# Patient Record
Sex: Male | Born: 1935 | Race: White | Hispanic: No | Marital: Married | Smoking: Former smoker
Health system: Southern US, Community
[De-identification: ages and names within clinical notes are randomized; demographics above are authoritative.]

## PROBLEM LIST (undated history)

## (undated) DIAGNOSIS — R2689 Other abnormalities of gait and mobility: Secondary | ICD-10-CM

## (undated) DIAGNOSIS — Z9289 Personal history of other medical treatment: Secondary | ICD-10-CM

## (undated) DIAGNOSIS — Z8619 Personal history of other infectious and parasitic diseases: Secondary | ICD-10-CM

## (undated) DIAGNOSIS — F329 Major depressive disorder, single episode, unspecified: Secondary | ICD-10-CM

## (undated) DIAGNOSIS — G629 Polyneuropathy, unspecified: Secondary | ICD-10-CM

## (undated) DIAGNOSIS — H35379 Puckering of macula, unspecified eye: Secondary | ICD-10-CM

## (undated) DIAGNOSIS — N390 Urinary tract infection, site not specified: Secondary | ICD-10-CM

## (undated) DIAGNOSIS — R339 Retention of urine, unspecified: Secondary | ICD-10-CM

## (undated) DIAGNOSIS — D649 Anemia, unspecified: Secondary | ICD-10-CM

## (undated) DIAGNOSIS — S8010XA Contusion of unspecified lower leg, initial encounter: Secondary | ICD-10-CM

## (undated) DIAGNOSIS — W19XXXA Unspecified fall, initial encounter: Secondary | ICD-10-CM

## (undated) DIAGNOSIS — K5641 Fecal impaction: Secondary | ICD-10-CM

## (undated) DIAGNOSIS — I739 Peripheral vascular disease, unspecified: Secondary | ICD-10-CM

## (undated) DIAGNOSIS — R531 Weakness: Secondary | ICD-10-CM

## (undated) DIAGNOSIS — E039 Hypothyroidism, unspecified: Secondary | ICD-10-CM

## (undated) DIAGNOSIS — IMO0002 Reserved for concepts with insufficient information to code with codable children: Secondary | ICD-10-CM

## (undated) DIAGNOSIS — G250 Essential tremor: Secondary | ICD-10-CM

## (undated) DIAGNOSIS — M129 Arthropathy, unspecified: Secondary | ICD-10-CM

## (undated) DIAGNOSIS — M4802 Spinal stenosis, cervical region: Secondary | ICD-10-CM

## (undated) DIAGNOSIS — E349 Endocrine disorder, unspecified: Secondary | ICD-10-CM

## (undated) DIAGNOSIS — I491 Atrial premature depolarization: Secondary | ICD-10-CM

## (undated) DIAGNOSIS — R296 Repeated falls: Secondary | ICD-10-CM

## (undated) DIAGNOSIS — K4120 Bilateral femoral hernia, without obstruction or gangrene, not specified as recurrent: Secondary | ICD-10-CM

## (undated) DIAGNOSIS — N179 Acute kidney failure, unspecified: Secondary | ICD-10-CM

## (undated) DIAGNOSIS — R338 Other retention of urine: Secondary | ICD-10-CM

## (undated) DIAGNOSIS — F32A Depression, unspecified: Secondary | ICD-10-CM

## (undated) DIAGNOSIS — N312 Flaccid neuropathic bladder, not elsewhere classified: Secondary | ICD-10-CM

## (undated) DIAGNOSIS — I998 Other disorder of circulatory system: Secondary | ICD-10-CM

## (undated) DIAGNOSIS — E785 Hyperlipidemia, unspecified: Secondary | ICD-10-CM

## (undated) DIAGNOSIS — G43909 Migraine, unspecified, not intractable, without status migrainosus: Secondary | ICD-10-CM

## (undated) DIAGNOSIS — Y92009 Unspecified place in unspecified non-institutional (private) residence as the place of occurrence of the external cause: Secondary | ICD-10-CM

## (undated) DIAGNOSIS — I6529 Occlusion and stenosis of unspecified carotid artery: Secondary | ICD-10-CM

## (undated) DIAGNOSIS — G5760 Lesion of plantar nerve, unspecified lower limb: Secondary | ICD-10-CM

## (undated) DIAGNOSIS — N433 Hydrocele, unspecified: Secondary | ICD-10-CM

## (undated) DIAGNOSIS — N400 Enlarged prostate without lower urinary tract symptoms: Secondary | ICD-10-CM

## (undated) DIAGNOSIS — R4182 Altered mental status, unspecified: Secondary | ICD-10-CM

## (undated) DIAGNOSIS — K4020 Bilateral inguinal hernia, without obstruction or gangrene, not specified as recurrent: Secondary | ICD-10-CM

## (undated) DIAGNOSIS — N3941 Urge incontinence: Secondary | ICD-10-CM

## (undated) DIAGNOSIS — G252 Other specified forms of tremor: Secondary | ICD-10-CM

## (undated) DIAGNOSIS — H3581 Retinal edema: Secondary | ICD-10-CM

## (undated) DIAGNOSIS — Q742 Other congenital malformations of lower limb(s), including pelvic girdle: Secondary | ICD-10-CM

## (undated) DIAGNOSIS — H3530 Unspecified macular degeneration: Secondary | ICD-10-CM

## (undated) DIAGNOSIS — Z961 Presence of intraocular lens: Secondary | ICD-10-CM

## (undated) DIAGNOSIS — E559 Vitamin D deficiency, unspecified: Secondary | ICD-10-CM

## (undated) DIAGNOSIS — R600 Localized edema: Secondary | ICD-10-CM

## (undated) DIAGNOSIS — S7001XA Contusion of right hip, initial encounter: Secondary | ICD-10-CM

## (undated) DIAGNOSIS — Z800 Family history of malignant neoplasm of digestive organs: Secondary | ICD-10-CM

## (undated) DIAGNOSIS — I70229 Atherosclerosis of native arteries of extremities with rest pain, unspecified extremity: Secondary | ICD-10-CM

## (undated) DIAGNOSIS — M4800 Spinal stenosis, site unspecified: Secondary | ICD-10-CM

## (undated) DIAGNOSIS — R262 Difficulty in walking, not elsewhere classified: Secondary | ICD-10-CM

## (undated) DIAGNOSIS — H4010X0 Unspecified open-angle glaucoma, stage unspecified: Secondary | ICD-10-CM

## (undated) DIAGNOSIS — R001 Bradycardia, unspecified: Secondary | ICD-10-CM

## (undated) DIAGNOSIS — Z86010 Personal history of colonic polyps: Secondary | ICD-10-CM

## (undated) DIAGNOSIS — F411 Generalized anxiety disorder: Secondary | ICD-10-CM

## (undated) DIAGNOSIS — K209 Esophagitis, unspecified: Secondary | ICD-10-CM

## (undated) DIAGNOSIS — I10 Essential (primary) hypertension: Secondary | ICD-10-CM

## (undated) HISTORY — DX: Personal history of other medical treatment: Z92.89

## (undated) HISTORY — DX: Bilateral femoral hernia, without obstruction or gangrene, not specified as recurrent: K41.20

## (undated) HISTORY — DX: Bilateral inguinal hernia, without obstruction or gangrene, not specified as recurrent: K40.20

## (undated) HISTORY — DX: Puckering of macula, unspecified eye: H35.379

## (undated) HISTORY — DX: Hypothyroidism, unspecified: E03.9

## (undated) HISTORY — DX: Unspecified fall, initial encounter: W19.XXXA

## (undated) HISTORY — DX: Essential (primary) hypertension: I10

## (undated) HISTORY — DX: Retinal edema: H35.81

## (undated) HISTORY — DX: Essential tremor: G25.0

## (undated) HISTORY — DX: Urinary tract infection, site not specified: N39.0

## (undated) HISTORY — DX: Vitamin D deficiency, unspecified: E55.9

## (undated) HISTORY — DX: Spinal stenosis, cervical region: M48.02

## (undated) HISTORY — DX: Migraine, unspecified, not intractable, without status migrainosus: G43.909

## (undated) HISTORY — PX: TONSILLECTOMY AND ADENOIDECTOMY: SUR1326

## (undated) HISTORY — DX: Reserved for concepts with insufficient information to code with codable children: IMO0002

## (undated) HISTORY — DX: Flaccid neuropathic bladder, not elsewhere classified: N31.2

## (undated) HISTORY — DX: Unspecified open-angle glaucoma, stage unspecified: H40.10X0

## (undated) HISTORY — DX: Hyperlipidemia, unspecified: E78.5

## (undated) HISTORY — DX: Difficulty in walking, not elsewhere classified: R26.2

## (undated) HISTORY — DX: Hydrocele, unspecified: N43.3

## (undated) HISTORY — DX: Polyneuropathy, unspecified: G62.9

## (undated) HISTORY — DX: Major depressive disorder, single episode, unspecified: F32.9

## (undated) HISTORY — DX: Lesion of plantar nerve, unspecified lower limb: G57.60

## (undated) HISTORY — DX: Altered mental status, unspecified: R41.82

## (undated) HISTORY — DX: Contusion of right hip, initial encounter: S70.01XA

## (undated) HISTORY — DX: Personal history of other infectious and parasitic diseases: Z86.19

## (undated) HISTORY — DX: Occlusion and stenosis of unspecified carotid artery: I65.29

## (undated) HISTORY — DX: Benign prostatic hyperplasia without lower urinary tract symptoms: N40.0

## (undated) HISTORY — DX: Endocrine disorder, unspecified: E34.9

## (undated) HISTORY — DX: Other disorder of circulatory system: I99.8

## (undated) HISTORY — DX: Generalized anxiety disorder: F41.1

## (undated) HISTORY — DX: Bradycardia, unspecified: R00.1

## (undated) HISTORY — DX: Other specified forms of tremor: G25.2

## (undated) HISTORY — DX: Anemia, unspecified: D64.9

## (undated) HISTORY — DX: Atherosclerosis of native arteries of extremities with rest pain, unspecified extremity: I70.229

## (undated) HISTORY — DX: Localized edema: R60.0

## (undated) HISTORY — DX: Contusion of unspecified lower leg, initial encounter: S80.10XA

## (undated) HISTORY — DX: Spinal stenosis, site unspecified: M48.00

## (undated) HISTORY — DX: Weakness: R53.1

## (undated) HISTORY — DX: Depression, unspecified: F32.A

## (undated) HISTORY — DX: Other retention of urine: R33.8

## (undated) HISTORY — DX: Other congenital malformations of lower limb(s), including pelvic girdle: Q74.2

## (undated) HISTORY — DX: Unspecified macular degeneration: H35.30

## (undated) HISTORY — DX: Presence of intraocular lens: Z96.1

## (undated) HISTORY — DX: Esophagitis, unspecified: K20.9

## (undated) HISTORY — DX: Urge incontinence: N39.41

## (undated) HISTORY — DX: Repeated falls: R29.6

## (undated) HISTORY — PX: LUMBAR LAMINECTOMY: SHX95

## (undated) HISTORY — DX: Retention of urine, unspecified: R33.9

## (undated) HISTORY — DX: Peripheral vascular disease, unspecified: I73.9

## (undated) HISTORY — DX: Arthropathy, unspecified: M12.9

## (undated) HISTORY — DX: Acute kidney failure, unspecified: N17.9

## (undated) HISTORY — DX: Family history of malignant neoplasm of digestive organs: Z80.0

## (undated) HISTORY — PX: CATARACT EXTRACTION: SUR2

## (undated) HISTORY — DX: Atrial premature depolarization: I49.1

## (undated) HISTORY — DX: Personal history of colonic polyps: Z86.010

## (undated) HISTORY — DX: Other abnormalities of gait and mobility: R26.89

---
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
## 1997-12-31 ENCOUNTER — Encounter (HOSPITAL_COMMUNITY): Admission: RE | Admit: 1997-12-31 | Discharge: 1998-03-31 | Payer: Self-pay | Admitting: Internal Medicine

## 2000-03-29 ENCOUNTER — Encounter: Payer: Self-pay | Admitting: Internal Medicine

## 2000-03-29 ENCOUNTER — Inpatient Hospital Stay (HOSPITAL_COMMUNITY): Admission: EM | Admit: 2000-03-29 | Discharge: 2000-03-31 | Payer: Self-pay | Admitting: Emergency Medicine

## 2000-03-29 ENCOUNTER — Encounter: Payer: Self-pay | Admitting: Emergency Medicine

## 2001-12-18 ENCOUNTER — Ambulatory Visit: Admission: RE | Admit: 2001-12-18 | Discharge: 2001-12-18 | Payer: Self-pay | Admitting: Internal Medicine

## 2002-02-23 ENCOUNTER — Emergency Department (HOSPITAL_COMMUNITY): Admission: EM | Admit: 2002-02-23 | Discharge: 2002-02-23 | Payer: Self-pay | Admitting: *Deleted

## 2002-03-19 ENCOUNTER — Ambulatory Visit (HOSPITAL_BASED_OUTPATIENT_CLINIC_OR_DEPARTMENT_OTHER): Admission: RE | Admit: 2002-03-19 | Discharge: 2002-03-19 | Payer: Self-pay | Admitting: Neurology

## 2002-08-06 DIAGNOSIS — K209 Esophagitis, unspecified without bleeding: Secondary | ICD-10-CM

## 2002-08-06 HISTORY — DX: Esophagitis, unspecified without bleeding: K20.90

## 2003-02-16 HISTORY — PX: TOTAL HIP ARTHROPLASTY: SHX124

## 2003-02-16 HISTORY — PX: CAROTID ENDARTERECTOMY: SUR193

## 2003-03-21 ENCOUNTER — Inpatient Hospital Stay (HOSPITAL_COMMUNITY): Admission: RE | Admit: 2003-03-21 | Discharge: 2003-03-25 | Payer: Self-pay | Admitting: Orthopedic Surgery

## 2003-03-25 ENCOUNTER — Inpatient Hospital Stay (HOSPITAL_COMMUNITY)
Admission: RE | Admit: 2003-03-25 | Discharge: 2003-04-02 | Payer: Self-pay | Admitting: Physical Medicine & Rehabilitation

## 2004-04-16 ENCOUNTER — Ambulatory Visit: Payer: Self-pay | Admitting: Internal Medicine

## 2004-09-01 ENCOUNTER — Ambulatory Visit: Payer: Self-pay | Admitting: Internal Medicine

## 2004-09-10 ENCOUNTER — Ambulatory Visit: Payer: Self-pay | Admitting: Internal Medicine

## 2004-09-23 ENCOUNTER — Ambulatory Visit: Payer: Self-pay | Admitting: Internal Medicine

## 2004-11-24 ENCOUNTER — Ambulatory Visit: Payer: Self-pay | Admitting: Internal Medicine

## 2005-04-11 ENCOUNTER — Encounter: Admission: RE | Admit: 2005-04-11 | Discharge: 2005-04-11 | Payer: Self-pay | Admitting: Orthopedic Surgery

## 2005-04-12 ENCOUNTER — Ambulatory Visit: Payer: Self-pay | Admitting: Internal Medicine

## 2005-04-23 ENCOUNTER — Ambulatory Visit: Payer: Self-pay | Admitting: Internal Medicine

## 2005-04-29 ENCOUNTER — Ambulatory Visit: Payer: Self-pay | Admitting: Internal Medicine

## 2005-05-17 ENCOUNTER — Ambulatory Visit: Payer: Self-pay | Admitting: Internal Medicine

## 2005-05-27 ENCOUNTER — Ambulatory Visit: Payer: Self-pay | Admitting: Internal Medicine

## 2005-06-03 ENCOUNTER — Ambulatory Visit: Payer: Self-pay

## 2005-07-09 ENCOUNTER — Ambulatory Visit: Payer: Self-pay | Admitting: Cardiology

## 2005-08-03 ENCOUNTER — Inpatient Hospital Stay (HOSPITAL_COMMUNITY): Admission: RE | Admit: 2005-08-03 | Discharge: 2005-08-04 | Payer: Self-pay | Admitting: *Deleted

## 2005-08-03 ENCOUNTER — Encounter (INDEPENDENT_AMBULATORY_CARE_PROVIDER_SITE_OTHER): Payer: Self-pay | Admitting: Specialist

## 2005-12-02 ENCOUNTER — Ambulatory Visit: Payer: Self-pay | Admitting: Internal Medicine

## 2005-12-29 ENCOUNTER — Ambulatory Visit: Payer: Self-pay | Admitting: Internal Medicine

## 2005-12-29 LAB — CONVERTED CEMR LAB
Chol/HDL Ratio, serum: 3.6
Cholesterol: 131 mg/dL (ref 0–200)

## 2006-03-14 DIAGNOSIS — I491 Atrial premature depolarization: Secondary | ICD-10-CM

## 2006-03-14 DIAGNOSIS — G992 Myelopathy in diseases classified elsewhere: Secondary | ICD-10-CM

## 2006-03-14 DIAGNOSIS — M4800 Spinal stenosis, site unspecified: Secondary | ICD-10-CM

## 2006-03-14 DIAGNOSIS — G43909 Migraine, unspecified, not intractable, without status migrainosus: Secondary | ICD-10-CM

## 2006-03-14 DIAGNOSIS — M4802 Spinal stenosis, cervical region: Secondary | ICD-10-CM

## 2006-03-14 HISTORY — DX: Atrial premature depolarization: I49.1

## 2006-03-14 HISTORY — DX: Spinal stenosis, site unspecified: M48.00

## 2006-05-26 ENCOUNTER — Ambulatory Visit: Payer: Self-pay

## 2006-05-26 ENCOUNTER — Ambulatory Visit: Payer: Self-pay | Admitting: Cardiology

## 2006-05-31 ENCOUNTER — Ambulatory Visit: Payer: Self-pay | Admitting: Internal Medicine

## 2006-05-31 LAB — CONVERTED CEMR LAB
ALT: 17 units/L (ref 0–40)
AST: 21 units/L (ref 0–37)
Basophils Absolute: 0 10*3/uL (ref 0.0–0.1)
Basophils Relative: 0.2 % (ref 0.0–1.0)
Hemoglobin: 16.9 g/dL (ref 13.0–17.0)
Lymphocytes Relative: 21.5 % (ref 12.0–46.0)
MCHC: 33.3 g/dL (ref 30.0–36.0)
MCV: 92.3 fL (ref 78.0–100.0)
Monocytes Absolute: 0.6 10*3/uL (ref 0.2–0.7)
Monocytes Relative: 8.2 % (ref 3.0–11.0)
Platelets: 158 10*3/uL (ref 150–400)
TSH: 2.09 microintl units/mL (ref 0.35–5.50)
WBC: 6.8 10*3/uL (ref 4.5–10.5)

## 2006-07-06 ENCOUNTER — Ambulatory Visit: Payer: Self-pay | Admitting: Cardiology

## 2006-07-29 ENCOUNTER — Ambulatory Visit: Payer: Self-pay | Admitting: Internal Medicine

## 2006-07-29 DIAGNOSIS — K5900 Constipation, unspecified: Secondary | ICD-10-CM | POA: Insufficient documentation

## 2006-07-30 LAB — CONVERTED CEMR LAB
Bacteria, UA: NEGATIVE
Leukocytes, UA: NEGATIVE
Nitrite: NEGATIVE
RBC / HPF: NONE SEEN
Urobilinogen, UA: 0.2 (ref 0.0–1.0)
WBC, UA: NONE SEEN cells/hpf

## 2006-08-23 ENCOUNTER — Ambulatory Visit: Payer: Self-pay | Admitting: Gastroenterology

## 2006-08-30 ENCOUNTER — Ambulatory Visit: Payer: Self-pay | Admitting: Internal Medicine

## 2006-08-31 ENCOUNTER — Ambulatory Visit: Payer: Self-pay | Admitting: Internal Medicine

## 2006-09-02 ENCOUNTER — Ambulatory Visit: Payer: Self-pay | Admitting: Internal Medicine

## 2006-09-08 ENCOUNTER — Encounter (INDEPENDENT_AMBULATORY_CARE_PROVIDER_SITE_OTHER): Payer: Self-pay | Admitting: *Deleted

## 2006-09-08 LAB — CONVERTED CEMR LAB
ALT: 21 units/L (ref 0–53)
AST: 19 units/L (ref 0–37)
Cholesterol: 127 mg/dL (ref 0–200)
Glucose, Bld: 77 mg/dL (ref 70–99)
LDL Cholesterol: 70 mg/dL (ref 0–99)
Total CHOL/HDL Ratio: 3.2
Triglycerides: 90 mg/dL (ref 0–149)
VLDL: 18 mg/dL (ref 0–40)

## 2006-09-27 ENCOUNTER — Telehealth (INDEPENDENT_AMBULATORY_CARE_PROVIDER_SITE_OTHER): Payer: Self-pay | Admitting: *Deleted

## 2006-10-06 ENCOUNTER — Ambulatory Visit: Payer: Self-pay | Admitting: Gastroenterology

## 2006-10-20 ENCOUNTER — Encounter: Payer: Self-pay | Admitting: Internal Medicine

## 2006-10-20 ENCOUNTER — Ambulatory Visit: Payer: Self-pay | Admitting: Gastroenterology

## 2006-11-10 ENCOUNTER — Ambulatory Visit: Payer: Self-pay | Admitting: Internal Medicine

## 2006-11-10 DIAGNOSIS — E039 Hypothyroidism, unspecified: Secondary | ICD-10-CM

## 2006-11-10 DIAGNOSIS — E782 Mixed hyperlipidemia: Secondary | ICD-10-CM

## 2006-11-11 ENCOUNTER — Encounter (INDEPENDENT_AMBULATORY_CARE_PROVIDER_SITE_OTHER): Payer: Self-pay | Admitting: *Deleted

## 2006-11-18 ENCOUNTER — Ambulatory Visit: Payer: Self-pay

## 2006-11-18 ENCOUNTER — Encounter: Payer: Self-pay | Admitting: Internal Medicine

## 2006-11-28 ENCOUNTER — Encounter (INDEPENDENT_AMBULATORY_CARE_PROVIDER_SITE_OTHER): Payer: Self-pay | Admitting: *Deleted

## 2006-12-20 ENCOUNTER — Encounter: Payer: Self-pay | Admitting: Internal Medicine

## 2007-02-16 DIAGNOSIS — Z8619 Personal history of other infectious and parasitic diseases: Secondary | ICD-10-CM

## 2007-02-16 HISTORY — DX: Personal history of other infectious and parasitic diseases: Z86.19

## 2007-03-16 ENCOUNTER — Ambulatory Visit: Payer: Self-pay | Admitting: Internal Medicine

## 2007-03-16 DIAGNOSIS — I739 Peripheral vascular disease, unspecified: Secondary | ICD-10-CM | POA: Insufficient documentation

## 2007-03-31 ENCOUNTER — Ambulatory Visit: Payer: Self-pay | Admitting: Internal Medicine

## 2007-03-31 DIAGNOSIS — I6529 Occlusion and stenosis of unspecified carotid artery: Secondary | ICD-10-CM

## 2007-03-31 HISTORY — DX: Occlusion and stenosis of unspecified carotid artery: I65.29

## 2007-04-03 ENCOUNTER — Encounter (INDEPENDENT_AMBULATORY_CARE_PROVIDER_SITE_OTHER): Payer: Self-pay | Admitting: *Deleted

## 2007-04-05 ENCOUNTER — Telehealth (INDEPENDENT_AMBULATORY_CARE_PROVIDER_SITE_OTHER): Payer: Self-pay | Admitting: *Deleted

## 2007-04-06 ENCOUNTER — Ambulatory Visit: Payer: Self-pay | Admitting: Internal Medicine

## 2007-04-06 DIAGNOSIS — B029 Zoster without complications: Secondary | ICD-10-CM | POA: Insufficient documentation

## 2007-05-17 ENCOUNTER — Ambulatory Visit: Payer: Self-pay

## 2007-05-17 ENCOUNTER — Encounter: Payer: Self-pay | Admitting: Internal Medicine

## 2007-06-07 DIAGNOSIS — M129 Arthropathy, unspecified: Secondary | ICD-10-CM | POA: Insufficient documentation

## 2007-06-07 DIAGNOSIS — F411 Generalized anxiety disorder: Secondary | ICD-10-CM

## 2007-06-07 HISTORY — DX: Generalized anxiety disorder: F41.1

## 2007-06-07 HISTORY — DX: Arthropathy, unspecified: M12.9

## 2007-08-22 ENCOUNTER — Ambulatory Visit: Payer: Self-pay | Admitting: Internal Medicine

## 2007-08-22 DIAGNOSIS — G5760 Lesion of plantar nerve, unspecified lower limb: Secondary | ICD-10-CM

## 2007-08-22 HISTORY — DX: Lesion of plantar nerve, unspecified lower limb: G57.60

## 2007-08-24 ENCOUNTER — Encounter (INDEPENDENT_AMBULATORY_CARE_PROVIDER_SITE_OTHER): Payer: Self-pay | Admitting: *Deleted

## 2007-10-13 ENCOUNTER — Ambulatory Visit: Payer: Self-pay | Admitting: Cardiovascular Disease

## 2007-11-01 ENCOUNTER — Encounter (INDEPENDENT_AMBULATORY_CARE_PROVIDER_SITE_OTHER): Payer: Self-pay | Admitting: *Deleted

## 2008-02-26 ENCOUNTER — Ambulatory Visit: Payer: Self-pay | Admitting: Internal Medicine

## 2008-02-26 LAB — CONVERTED CEMR LAB
ALT: 20 units/L (ref 0–53)
Albumin: 3.9 g/dL (ref 3.5–5.2)
Alkaline Phosphatase: 41 units/L (ref 39–117)
BUN: 28 mg/dL — ABNORMAL HIGH (ref 6–23)
Cholesterol: 165 mg/dL (ref 0–200)
Hgb A1c MFr Bld: 5.4 % (ref 4.6–6.0)
LDL Cholesterol: 101 mg/dL — ABNORMAL HIGH (ref 0–99)
Potassium: 4.1 meq/L (ref 3.5–5.1)
Total Bilirubin: 0.9 mg/dL (ref 0.3–1.2)
Total CHOL/HDL Ratio: 3.8

## 2008-03-04 ENCOUNTER — Ambulatory Visit: Payer: Self-pay | Admitting: Internal Medicine

## 2008-03-04 LAB — CONVERTED CEMR LAB: LDL Goal: 100 mg/dL

## 2008-03-11 ENCOUNTER — Encounter (INDEPENDENT_AMBULATORY_CARE_PROVIDER_SITE_OTHER): Payer: Self-pay | Admitting: *Deleted

## 2008-05-23 ENCOUNTER — Ambulatory Visit: Payer: Self-pay | Admitting: Internal Medicine

## 2008-05-30 ENCOUNTER — Encounter: Payer: Self-pay | Admitting: Internal Medicine

## 2008-11-14 ENCOUNTER — Encounter: Payer: Self-pay | Admitting: Internal Medicine

## 2009-03-17 ENCOUNTER — Telehealth (INDEPENDENT_AMBULATORY_CARE_PROVIDER_SITE_OTHER): Payer: Self-pay | Admitting: *Deleted

## 2009-04-16 ENCOUNTER — Ambulatory Visit: Payer: Self-pay | Admitting: Internal Medicine

## 2009-09-11 ENCOUNTER — Encounter: Payer: Self-pay | Admitting: Internal Medicine

## 2009-11-12 ENCOUNTER — Ambulatory Visit: Payer: Self-pay | Admitting: Internal Medicine

## 2009-11-12 DIAGNOSIS — K4090 Unilateral inguinal hernia, without obstruction or gangrene, not specified as recurrent: Secondary | ICD-10-CM | POA: Insufficient documentation

## 2009-11-12 DIAGNOSIS — R2681 Unsteadiness on feet: Secondary | ICD-10-CM | POA: Insufficient documentation

## 2009-11-21 ENCOUNTER — Encounter: Payer: Self-pay | Admitting: Internal Medicine

## 2009-11-24 ENCOUNTER — Encounter: Payer: Self-pay | Admitting: Internal Medicine

## 2009-11-26 ENCOUNTER — Encounter: Payer: Self-pay | Admitting: Internal Medicine

## 2009-11-27 ENCOUNTER — Encounter: Payer: Self-pay | Admitting: Internal Medicine

## 2009-11-28 ENCOUNTER — Encounter (INDEPENDENT_AMBULATORY_CARE_PROVIDER_SITE_OTHER): Payer: Self-pay | Admitting: *Deleted

## 2010-03-10 ENCOUNTER — Ambulatory Visit
Admission: RE | Admit: 2010-03-10 | Discharge: 2010-03-10 | Payer: Self-pay | Source: Home / Self Care | Attending: Internal Medicine | Admitting: Internal Medicine

## 2010-03-10 ENCOUNTER — Other Ambulatory Visit: Payer: Self-pay | Admitting: Internal Medicine

## 2010-03-10 DIAGNOSIS — R519 Headache, unspecified: Secondary | ICD-10-CM | POA: Insufficient documentation

## 2010-03-10 DIAGNOSIS — J069 Acute upper respiratory infection, unspecified: Secondary | ICD-10-CM | POA: Insufficient documentation

## 2010-03-10 DIAGNOSIS — R51 Headache: Secondary | ICD-10-CM | POA: Insufficient documentation

## 2010-03-10 DIAGNOSIS — IMO0001 Reserved for inherently not codable concepts without codable children: Secondary | ICD-10-CM | POA: Insufficient documentation

## 2010-03-10 DIAGNOSIS — M2550 Pain in unspecified joint: Secondary | ICD-10-CM | POA: Insufficient documentation

## 2010-03-10 LAB — CBC WITH DIFFERENTIAL/PLATELET
Basophils Relative: 0.4 % (ref 0.0–3.0)
Eosinophils Absolute: 0.2 10*3/uL (ref 0.0–0.7)
Eosinophils Relative: 2.7 % (ref 0.0–5.0)
Hemoglobin: 16.5 g/dL (ref 13.0–17.0)
Lymphs Abs: 1.4 10*3/uL (ref 0.7–4.0)
Monocytes Absolute: 0.6 10*3/uL (ref 0.1–1.0)
Monocytes Relative: 6.8 % (ref 3.0–12.0)
Neutrophils Relative %: 73.8 % (ref 43.0–77.0)
RBC: 5.18 Mil/uL (ref 4.22–5.81)
WBC: 8.3 10*3/uL (ref 4.5–10.5)

## 2010-03-10 LAB — SEDIMENTATION RATE: Sed Rate: 3 mm/hr (ref 0–22)

## 2010-03-15 LAB — CONVERTED CEMR LAB
ALT: 22 units/L (ref 0–53)
Bilirubin, Direct: 0 mg/dL (ref 0.0–0.3)
CO2: 31 meq/L (ref 19–32)
Calcium: 9.4 mg/dL (ref 8.4–10.5)
Creatinine, Ser: 1.6 mg/dL — ABNORMAL HIGH (ref 0.4–1.5)
Eosinophils Absolute: 0.2 10*3/uL (ref 0.0–0.7)
Eosinophils Relative: 4 % (ref 0.0–5.0)
GFR calc non Af Amer: 44.99 mL/min (ref >60)
Glucose, Bld: 72 mg/dL (ref 70–99)
LDL Goal: 130 mg/dL
Lymphocytes Relative: 23.5 % (ref 12.0–46.0)
Lymphs Abs: 1.2 10*3/uL (ref 0.7–4.0)
Monocytes Absolute: 0.4 10*3/uL (ref 0.1–1.0)
Neutro Abs: 3.3 10*3/uL (ref 1.4–7.7)
Neutrophils Relative %: 62.9 % (ref 43.0–77.0)
Platelets: 130 10*3/uL — ABNORMAL LOW (ref 150.0–400.0)
Potassium: 4.1 meq/L (ref 3.5–5.1)
RBC: 5 M/uL (ref 4.22–5.81)
RDW: 12.5 % (ref 11.5–14.6)
Sodium: 142 meq/L (ref 135–145)
Total CHOL/HDL Ratio: 3
WBC: 5.2 10*3/uL (ref 4.5–10.5)

## 2010-03-17 NOTE — Consult Note (Signed)
Summary: [facility]  Consultation Report   Imported By: Lanelle Bal 09/29/2009 12:47:40  _____________________________________________________________________  External Attachment:    Type:   Image     Comment:   External Document

## 2010-03-17 NOTE — Consult Note (Signed)
Summary: [facility]  [facility]   Imported By: Lanelle Bal 12/03/2009 08:41:19  _____________________________________________________________________  External Attachment:    Type:   Image     Comment:   External Document

## 2010-03-17 NOTE — Progress Notes (Signed)
Summary: Appointment Due  Phone Note Outgoing Call Call back at [facility] Phone [phone]   Call placed by: Shonna Chock,  March 17, 2009 1:31 PM Call placed to: Patient Summary of Call: Enrique Sack, please call patient and schedule a Yearly Follow-up on Meds ( appointment), give patient a morning appointment so that he may come fasting for a full lab panel. **This is necessary to contiun refilling meds** Thanks./Chrae Malloy  March 17, 2009 1:32 PM     Additional Follow-up for Phone Call Additional follow up Details #2::    left message for patient to return call to the office.Marland KitchenMarland KitchenMarland KitchenBarb Merino  March 17, 2009 2:21 PM    patient has a  appt on march 2,2011.Marland KitchenMarland KitchenMarland KitchenBarb Merino  March 17, 2009 3:43 PM  Follow-up by: Barb Merino,  March 17, 2009 2:21 PM

## 2010-03-17 NOTE — Assessment & Plan Note (Signed)
Summary: FALLEN 3-4 TIMES IN LAST FEW WEEKS///SPH   Vital Signs:  Patient profile:   77 year old male Weight:      165.6 pounds BMI:     21.63 Temp:     98.0 degrees F oral Pulse rate:   56 / minute Resp:     15 per minute BP sitting:   114 / 70  (left arm) Cuff size:   large  Vitals Entered By: Shonna Chock CMA (November 12, 2009 11:45 AM) CC: Frequent falls due to balance loss   CC:  Frequent falls due to balance loss.  History of Present Illness: "Bulge" X several months L inguinal area  with intermittent discomfort. He has been able to reduce this temporarily.  Current Medications (verified): 1)  Topamax 25 Mg Tabs (Topiramate) .... Three Tabs Qpm 2)  Synthroid 50 Mcg Tabs (Levothyroxine Sodium) .Marland Kitchen.. 1 Poqd Except On Sunday 3)  Bayer Aspirin 325 Mg Tabs (Aspirin) .... Take 1 Tablet By Mouth Once A Day 4)  Mirtazapine 30 Mg Tbdp (Mirtazapine) .Marland Kitchen.. 1and 1/2 Tab Daily 5)  Deplin   Tabs (L-Methylfolate Tabs) .... 7.5 Mg 1 By Mouth Qd 6)  I Caps .... 2 Tabs Daily 7)  Flaxseed Oil 1300mg  .... Daily 8)  Wellbutrin Xl 300 Mg  Tb24 (Bupropion Hcl) .Marland Kitchen.. 1 By Mouth Qd 9)  Sertraline Hcl 100 Mg  Tabs (Sertraline Hcl) .... Take 2 Tabs Qd 10)  Simvastatin 40 Mg  Tabs (Simvastatin) .Marland Kitchen.. 1 Qhs 11)  Vitamin D3 2000 Unit Caps (Cholecalciferol) .Marland Kitchen.. 1 By Mouth Once Daily 12)  Calcium 600 Mg Tabs (Calcium) .Marland Kitchen.. 1 By Mouth Two Times A Day 13)  Multivitamins  Tabs (Multiple Vitamin) .Marland Kitchen.. 1 By Mouth Two Times A Day 14)  [name] 50 Mg/5gm Gel (Testosterone) .... As Directed 15)  Nevanac 0.1 % Susp (Nepafenac) .Marland Kitchen.. 1 Drop in Right Eye Two Times A Day 16)  Patanol 0.1 % Soln (Olopatadine Hcl) .... 2 Drops As Needed 17)  Fish Oil 1200 Mg Caps (Omega-3 Fatty Acids) .Marland Kitchen.. 1 By Mouth Once Daily  Allergies: 1)  * Latex  Review of Systems General:  Denies chills, fever, sweats, and weight loss. GI:  Denies abdominal pain, bloody stools, change in bowel habits, and dark tarry stools. GU:   Denies discharge, dysuria, and hematuria. Neuro:  Complains of falling down and poor balance; denies brief paralysis, headaches, numbness, tingling, and weakness; Gait issues X 2 years intermittently, worse in past 2 weeks.Marland Kitchen  Physical Exam  General:  in no acute distress; alert,appropriate and cooperative throughout examination Genitalia:  Testes bilaterally descended without nodularity, tenderness or masses. No scrotal masses or lesions. No penis lesions or urethral discharge. Small  reducible L hernia Neurologic:  alert & oriented X3.  Tremor of head & hands. Unable to complete Romberg due to imbalance. Strength & reflexes WNL.Decreased arm swing with slow methodical  gait.   Impression & Recommendations:  Problem # 1:  GAIT IMBALANCE (ICD-781.2) ? Parkinson's Orders: Neurology Referral (Neuro)  Problem # 2:  INGUINAL HERNIA, LEFT, SMALL (ICD-550.90)  Complete Medication List: 1)  Topamax 25 Mg Tabs (Topiramate) .... Three tabs qpm 2)  Synthroid 50 Mcg Tabs (Levothyroxine sodium) .Marland Kitchen.. 1 poqd except on sunday 3)  Bayer Aspirin 325 Mg Tabs (Aspirin) .... Take 1 tablet by mouth once a day 4)  Mirtazapine 30 Mg Tbdp (Mirtazapine) .Marland Kitchen.. 1and 1/2 tab daily 5)  Deplin Tabs (L-methylfolate tabs) .... 7.5 mg 1 by mouth qd 6)  I Caps  .... 2 tabs daily 7)  Flaxseed Oil 1300mg   .... Daily 8)  Wellbutrin Xl 300 Mg Tb24 (Bupropion hcl) .Marland Kitchen.. 1 by mouth qd 9)  Sertraline Hcl 100 Mg Tabs (Sertraline hcl) .... Take 2 tabs qd 10)  Simvastatin 40 Mg Tabs (Simvastatin) .Marland Kitchen.. 1 qhs 11)  Vitamin D3 2000 Unit Caps (Cholecalciferol) .Marland Kitchen.. 1 by mouth once daily 12)  Calcium 600 Mg Tabs (Calcium) .Marland Kitchen.. 1 by mouth two times a day 13)  Multivitamins Tabs (Multiple vitamin) .Marland Kitchen.. 1 by mouth two times a day 14)  [name] 50 Mg/5gm Gel (Testosterone) .... As directed 15)  Nevanac 0.1 % Susp (Nepafenac) .Marland Kitchen.. 1 drop in right eye two times a day 16)  Patanol 0.1 % Soln (Olopatadine hcl) .... 2 drops as needed 17)   Fish Oil 1200 Mg Caps (Omega-3 fatty acids) .Marland Kitchen.. 1 by mouth once daily  Patient Instructions: 1)  No lifting > 15 #. Report persistant pain @ hernia.

## 2010-03-17 NOTE — Assessment & Plan Note (Signed)
Summary: cpx/ns/kdc   Vital Signs:  Patient profile:   77 year old male Height:      73.5 inches Weight:      165.4 pounds BMI:     21.60 Temp:     97.7 degrees F oral Pulse rate:   55 / minute Resp:     16 per minute BP sitting:   114 / 72  (left arm) Cuff size:   large  Vitals Entered By: Shonna Chock (April 16, 2009 10:40 AM)  Comments REVIEWED MED LIST, PATIENT AGREED DOSE AND INSTRUCTION CORRECT    History of Present Illness: Jose Leach is here for preventive care [facility]); he is essentially asymptomatic.  Preventive Screening-Counseling & Management  Caffeine-Diet-Exercise     Does Patient Exercise: yes  Allergies: 1)  * Latex  Past History:  Past Medical History: Hyperlipidemia Hypothyroidism Peripheral vascular disease Lumbar spinal stenosis migraine headaches premature atrial contractions, PMH of Testosterone Deficiency, Dr Earlene Plater  Past Surgical History: Appendectomy Colonoscopy  2008 negative , Dr Arlyce Dice (F had colorectal CA) Endarterectomy , L carotid Hip replacement L, Dr Darrelyn Hillock Lumbar laminectomy, Dr Darrelyn Hillock Cataract extraction OD 2008  Family History: mother :diabetes, obesity, CVA, HTN father:  CAD,  colorectal  cancer , rectum brother died @ age 60, ? epilepsy sister died  @ age 50 chronic nephritis possibly due to strep infection sister overweight ; no FH premature MI  Social History: Former Smoker: smoked pipe and cigars  in his thirties Alcohol use-yes: 2 glasses  of wine 4-5X /week Retired [place] Regular exercise-yes: walking once daily X 30 min Married Does Patient Exercise:  yes  Review of Systems       The patient complains of vision loss and headaches.  The patient denies anorexia, fever, weight loss, weight gain, decreased hearing, hoarseness, chest pain, syncope, dyspnea on exertion, peripheral edema, prolonged cough, hemoptysis, abdominal pain, melena, hematochezia, severe indigestion/heartburn,  hematuria, incontinence, suspicious skin lesions, unusual weight change, abnormal bleeding, enlarged lymph nodes, and angioedema.         "Wrinkle"  OD retina with edema; seen by [facility] Op[facility]. OS ocular migraine ;Tylenol as needed . Dr [name] seen annually; Androgel Rxed. Psych:  Denies anxiety, depression, easily angered, easily tearful, and irritability; Symptoms controlled with medsRxed by Dr [name].  Physical Exam  General:  Thin,in no acute distress; appropriate and cooperative throughout examination Head:  Normocephalic and atraumatic without obvious abnormalities. No apparent alopecia ; moustache & beard Eyes:  No corneal or conjunctival inflammation noted. Ptosis OD. Perrla. Funduscopic exam benign, without hemorrhages, exudates or papilledema. OD fundus difficult to visualize Ears:  External ear exam shows no significant lesions or deformities.  Otoscopic examination reveals clear canals, tympanic membranes are intact bilaterally without bulging, retraction, inflammation or discharge. Hearing is grossly normal bilaterally. Nose:  External nasal examination shows no deformity or inflammation. Nasal mucosa are pink and moist without lesions or exudates. Mouth:  Oral mucosa and oropharynx without lesions or exudates.  Teeth in good repair. Neck:  No deformities, masses, or tenderness noted. Lungs:  Normal respiratory effort, chest expands symmetrically. Lungs are clear to auscultation, no crackles or wheezes. Heart:  no murmur, no gallop, no rub, no JVD, no HJR, bradycardia, and irregular rhythm.   Abdomen:  Bowel sounds positive,abdomen soft and non-tender without masses, organomegaly or hernias noted. Genitalia:  Dr Earlene Plater Prostate:  Dr Earlene Plater Msk:  No deformity or scoliosis noted of thoracic or lumbar spine.   Pulses:  R  and L carotid,radial  pulses are full and equal bilaterally. Decreased pedal pulses; plethora  of feet with  decreased temperature Extremities:  No cyanosis, edema, or  deformity noted with normal full range of motion of all joints. Some limb rigidity w/o cogwheeling. Minor clubbing suggested . Deformed toenails Neurologic:  alert & oriented X3 and DTRs symmetrical and normal.  Head tremor Skin:  Intact without suspicious lesions or rashes. Epidermoid inclusion cyst L axilla Cervical Nodes:  No lymphadenopathy noted Axillary Nodes:  No palpable lymphadenopathy Psych:  memory intact for recent and remote and subdued.     Impression & Recommendations:  Problem # 1:  PREVENTIVE HEALTH CARE (ICD-V70.0)  Orders: EKG w/ Interpretation (93000) Venipuncture (04540) TLB-Lipid Panel (80061-LIPID) TLB-BMP (Basic Metabolic Panel-BMET) (80048-METABOL) TLB-CBC Platelet - w/Differential (85025-CBCD) TLB-Hepatic/Liver Function Pnl (80076-HEPATIC) TLB-TSH (Thyroid Stimulating Hormone) (84443-TSH)  Problem # 2:  CAROTID ARTERY DISEASE (ICD-433.10) S/P endarterectomy L His updated medication list for this problem includes:    Bayer Aspirin 325 Mg Tabs (Aspirin) .Marland Kitchen... Take 1 tablet by mouth once a day  Problem # 3:  PERIPHERAL VASCULAR DISEASE (ICD-443.9) decreased pedal pulses w/o claudication  Problem # 4:  HYPOTHYROIDISM (ICD-244.9)  His updated medication list for this problem includes:    Synthroid 50 Mcg Tabs (Levothyroxine sodium) .Marland Kitchen... 1 poqd except on sunday  Orders: Venipuncture (98119) TLB-TSH (Thyroid Stimulating Hormone) (84443-TSH)  Problem # 5:  HYPERLIPIDEMIA (ICD-272.2)  His updated medication list for this problem includes:    Simvastatin 40 Mg Tabs (Simvastatin) .Marland Kitchen... 1 qhs  Orders: Venipuncture (14782) TLB-Lipid Panel (80061-LIPID)  Problem # 6:  ABNORMAL ELECTROCARDIOGRAM (ICD-794.31) Asymptomatic 2nd degree SA Block  Orders: EKG w/ Interpretation (93000)  Complete Medication List: 1)  Topamax 25 Mg Tabs (Topiramate) .... Three tabs qpm 2)  Synthroid 50 Mcg Tabs (Levothyroxine sodium) .Marland Kitchen.. 1 poqd except on  sunday 3)  Bayer Aspirin 325 Mg Tabs (Aspirin) .... Take 1 tablet by mouth once a day 4)  Mirtazapine 30 Mg Tbdp (Mirtazapine) .Marland Kitchen.. 1and 1/2 tab daily 5)  Deplin Tabs (L-methylfolate tabs) .... 7.5 mg 1 by mouth qd 6)  I Caps  .... 2 tabs daily 7)  Flaxseed Oil 1300mg   .... Daily 8)  Wellbutrin Xl 300 Mg Tb24 (Bupropion hcl) .Marland Kitchen.. 1 by mouth qd 9)  Sertraline Hcl 100 Mg Tabs (Sertraline hcl) .... Take 2 tabs qd 10)  Simvastatin 40 Mg Tabs (Simvastatin) .Marland Kitchen.. 1 qhs 11)  Vitamin D3 2000 Unit Caps (Cholecalciferol) .Marland Kitchen.. 1 by mouth once daily 12)  Calcium 600 Mg Tabs (Calcium) .Marland Kitchen.. 1 by mouth two times a day 13)  Multivitamins Tabs (Multiple vitamin) .Marland Kitchen.. 1 by mouth two times a day 14)  Androgel Pump 1 % Gel (Testosterone) .... As directed 15)  Nevanac 0.1 % Susp (Nepafenac) .Marland Kitchen.. 1 drop in right eye two times a day 16)  Patanol 0.1 % Soln (Olopatadine hcl) .... 2 drops as needed  Patient Instructions: 1)  It is not healthy  for men to drink more than 2-3 drinks per day . 2)  Schedule a colonoscopy  to help detect colon cancer as per Dr Marzetta Board recommendations. 3)  Take  coated   Aspirin every day,81 mg - 323 mg  / day. 4)  Choose your Health care Power of Attorney and/or prepare a Living Will. 5)  It is important that you exercise regularly at least 20 minutes 5 times a week (@ least 90 min total/ week). If you develop chest pain, calf  pain, difficulty breathing, or feel very tired , stop exercising immediately and seek medical attention. Wear seat belt. Safety proof house as discussed

## 2010-03-17 NOTE — Miscellaneous (Signed)
Summary: [facility]  [facility]   Imported By: Lanelle Bal 12/04/2009 16:11:52  _____________________________________________________________________  External Attachment:    Type:   Image     Comment:   External Document

## 2010-03-17 NOTE — Letter (Signed)
Summary: Primary Care Consult Scheduled Letter  [facility]  [address] [place], [place] [address]   Phone: [phone]  Fax: [phone]      11/28/2009 MRN: [id number]  Jose Leach [address] [place], [place]  [address]    Dear Mr. [facility],    We have scheduled an appointment for you.  At the recommendation of Dr. [name], we have scheduled you a consult with Dr. Lesia Sago of [facility] on 01-19-2010 at 3:00pm.  Their address is [address], [place] [address]. The office phone number is [phone].  If this appointment day and time is not convenient for you, please feel free to call the office of the doctor you are being referred to at the number listed above and reschedule the appointment.    It is important for you to keep your scheduled appointments. We are here to make sure you are given good patient care.   Thank you,    Renee, Patient Care Coordinator [place] [facility]

## 2010-03-17 NOTE — Miscellaneous (Signed)
Summary: Flu vacc documentation   Clinical Lists Changes  Observations: Added new observation of FLU VAX: Historical (11/26/2009 12:24)      Received notice from [facility] [place] that patient received flu vacc. Lucious Groves CMA  November 27, 2009 12:23 PM   Immunization History:  Influenza Immunization History:    Influenza:  historical (11/26/2009)

## 2010-03-19 NOTE — Assessment & Plan Note (Signed)
Summary: headache//ph   Vital Signs:  Patient profile:   77 year old male Weight:      159.8 pounds BMI:     20.87 Temp:     98.5 degrees F oral Pulse rate:   60 / minute Resp:     16 per minute BP sitting:   104 / 70  (left arm) Cuff size:   large  Vitals Entered By: Shonna Chock CMA (March 10, 2010 12:00 PM) CC: 1.) Headache and bodyaches x 1 week or more (off/on)  2.) Refill chlosterol med #90 day supply, URI symptoms   CC:  1.) Headache and bodyaches x 1 week or more (off/on)  2.) Refill chlosterol med #90 day supply and URI symptoms.  History of Present Illness:    Onset as frontal & L crown headache last week with myalgias . He had Flu shot in Fall.  The patient  now reports nasal congestion, sore throat, and dry cough, but denies purulent nasal discharge and earache.  The patient denies fever, dyspnea, and wheezing.  The patient denies  persistent frontal headache ,bilateral facial pain, tooth pain, and tender adenopathy.  Hyperlipidemia Follow-Up      The patient also presents for Hyperlipidemia follow-up & refill of Simvastatin , which ran out 1 week ago.  The patient denies abdominal pain, flushing, and itching.  The patient denies the following symptoms: chest pain/pressure, exercise intolerance, palpitations, syncope, and pedal edema.  Dietary compliance has been good.  The patient reports exercising 2-3 X per week.  Adjunctive measures currently used by the patient include fiber, ASA, and fish oil supplements.    Current Medications (verified): 1)  Topamax 25 Mg Tabs (Topiramate) .... Three Tabs Qpm 2)  Synthroid 50 Mcg Tabs (Levothyroxine Sodium) .Marland Kitchen.. 1 Poqd Except On Sunday 3)  Bayer Aspirin 325 Mg Tabs (Aspirin) .... Take 1 Tablet By Mouth Once A Day 4)  Mirtazapine 45 Mg Tabs (Mirtazapine) .Marland Kitchen.. 1 By Mouth Once Daily 5)  Deplin   Tabs (L-Methylfolate Tabs) .... 7.5 Mg 1 By Mouth Qd 6)  I Caps .... 2 Tabs Daily 7)  Flaxseed Oil 1300mg  .... Daily 8)  Wellbutrin  Xl 300 Mg  Tb24 (Bupropion Hcl) .Marland Kitchen.. 1 By Mouth Qd 9)  Sertraline Hcl 100 Mg  Tabs (Sertraline Hcl) .... Take 2 Tabs Qd 10)  Simvastatin 40 Mg  Tabs (Simvastatin) .Marland Kitchen.. 1 Qhs 11)  Vitamin D3 2000 Unit Caps (Cholecalciferol) .Marland Kitchen.. 1 By Mouth Once Daily 12)  Calcium 600 Mg Tabs (Calcium) .Marland Kitchen.. 1 By Mouth Two Times A Day 13)  [name] 50 Mg/5gm Gel (Testosterone) .... As Directed 14)  Nevanac 0.1 % Susp (Nepafenac) .Marland Kitchen.. 1 Drop in Right Eye Two Times A Day 15)  Patanol 0.1 % Soln (Olopatadine Hcl) .... 2 Drops As Needed 16)  Fish Oil 1200 Mg Caps (Omega-3 Fatty Acids) .Marland Kitchen.. 1 By Mouth Once Daily  Allergies: 1)  * Latex  Review of Systems General:  Complains of chills and sweats. GI:  Denies constipation and diarrhea; loose stool. GU:  Denies discharge, dysuria, and hematuria. MS:  Complains of joint pain and muscle aches; denies joint redness and joint swelling. Derm:  Denies lesion(s) and rash. 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  Physical Exam  General:  Coarse head tremor,in no acute distress; alert,appropriate and cooperative throughout examination Ears:  External ear exam shows no significant lesions or deformities.  Otoscopic examination reveals clear canals, tympanic membranes are intact bilaterally without bulging, retraction, inflammation  or discharge. Hearing is grossly normal bilaterally. Nose:  External nasal examination shows no deformity or inflammation. Nasal mucosa are pink and moist without lesions or exudates. Mouth:  Oral mucosa and oropharynx without lesions or exudates.  Teeth in good repair. Lungs:  Normal respiratory effort, chest expands symmetrically. Lungs are clear to auscultation, no crackles or wheezes. Heart:  Normal rate and regular rhythm. S1 and S2 normal without gallop, murmur, click, rub or other extra sounds. Abdomen:  Bowel sounds positive,abdomen soft and non-tender without masses, organomegaly or hernias noted. Extremities:  No significant joint  changes; marked pallor of nails Neurologic:  Some limb rigidity  Cervical Nodes:  No lymphadenopathy noted Axillary Nodes:  No palpable lymphadenopathy   Impression & Recommendations:  Problem # 1:  HEADACHE (ICD-784.0)  on Topamax from Dr [name]; R/O atypical bacterial infection/ URI His updated medication list for this problem includes:    Bayer Aspirin 325 Mg Tabs (Aspirin) .Marland Kitchen... Take 1 tablet by mouth once a day    Acetaminophen-codeine #3 300-30 Mg Tabs (Acetaminophen-codeine) .Marland Kitchen... 1 every 4-6 hrs as needed for pain  Orders: Venipuncture ([address]) TLB-Sedimentation Rate (ESR) (85652-ESR) Prescription Created Electronically [phone])  Problem # 2:  MUSCLE PAIN (ICD-729.1)  His updated medication list for this problem includes:    Bayer Aspirin 325 Mg Tabs (Aspirin) .Marland Kitchen... Take 1 tablet by mouth once a day    Acetaminophen-codeine #3 300-30 Mg Tabs (Acetaminophen-codeine) .Marland Kitchen... 1 every 4-6 hrs as needed for pain  Orders: Venipuncture (09811) TLB-CK Total Only(Creatine Kinase/CPK) (82550-CK)  Problem # 3:  ARTHRALGIA (ICD-719.40)  Orders: Venipuncture (91478) TLB-Sedimentation Rate (ESR) (85652-ESR)  Problem # 4:  HYPERLIPIDEMIA (ICD-272.2)  His updated medication list for this problem includes:    Simvastatin 40 Mg Tabs (Simvastatin) .Marland Kitchen... 1 qhs  Orders: Prescription Created Electronically [phone])  Problem # 5:  URI (ICD-465.9)  His updated medication list for this problem includes:    Bayer Aspirin 325 Mg Tabs (Aspirin) .Marland Kitchen... Take 1 tablet by mouth once a day  Orders: TLB-CBC Platelet - w/Differential (85025-CBCD)  Complete Medication List: 1)  Topamax 25 Mg Tabs (Topiramate) .... Three tabs qpm 2)  Synthroid 50 Mcg Tabs (Levothyroxine sodium) .Marland Kitchen.. 1 poqd except on sunday 3)  Bayer Aspirin 325 Mg Tabs (Aspirin) .... Take 1 tablet by mouth once a day 4)  Mirtazapine 45 Mg Tabs (Mirtazapine) .Marland Kitchen.. 1 by mouth once daily 5)  Deplin Tabs (L-methylfolate tabs)  .... 7.5 mg 1 by mouth qd 6)  I Caps  .... 2 tabs daily 7)  Flaxseed Oil 1300mg   .... Daily 8)  Wellbutrin Xl 300 Mg Tb24 (Bupropion hcl) .Marland Kitchen.. 1 by mouth qd 9)  Sertraline Hcl 100 Mg Tabs (Sertraline hcl) .... Take 2 tabs qd 10)  Simvastatin 40 Mg Tabs (Simvastatin) .Marland Kitchen.. 1 qhs 11)  Vitamin D3 2000 Unit Caps (Cholecalciferol) .Marland Kitchen.. 1 by mouth once daily 12)  Calcium 600 Mg Tabs (Calcium) .Marland Kitchen.. 1 by mouth two times a day 13)  [name] 50 Mg/5gm Gel (Testosterone) .... As directed 14)  Nevanac 0.1 % Susp (Nepafenac) .Marland Kitchen.. 1 drop in right eye two times a day 15)  Patanol 0.1 % Soln (Olopatadine hcl) .... 2 drops as needed 16)  Fish Oil 1200 Mg Caps (Omega-3 fatty acids) .Marland Kitchen.. 1 by mouth once daily 17)  Acetaminophen-codeine #3 300-30 Mg Tabs (Acetaminophen-codeine) .Marland Kitchen.. 1 every 4-6 hrs as needed for pain  Patient Instructions: 1)   Report  pain , pus & fever as discussed.Please schedule a  follow-up  fasting Lab appointment in 10  weeks. 2)  BMP , ICD-9:995.20, 272.4 3)  Hepatic Panel , ICD-9:995.20 4)  Lipid Panel , ICD-9:272.4 Prescriptions: SIMVASTATIN 40 MG  TABS (SIMVASTATIN) 1 qhs  #90 x 0   Entered and Authorized by:   [name] MD   Signed by:   [name] MD on 03/10/2010   Method used:   Electronically to        [facility] (retail)       [address]       [place], [place]  [address]       Ph: [phone]       Fax: [phone]   RxID:   [id number] ACETAMINOPHEN-CODEINE #3 300-30 MG TABS (ACETAMINOPHEN-CODEINE) 1 every 4-6 hrs as needed for pain  #30 x 0   Entered and Authorized by:   [name] MD   Signed by:   [name] MD on 03/10/2010   Method used:   Printed then faxed to ...       [facility] [address] [facility] (retail)       [address]       [place], [place]  [address]       Ph: [phone]       Fax: [phone]   RxID:   [id number]    Orders Added: 1)  Est. Patient Level IV [76160] 2)  Venipuncture  [73710] 3)  TLB-CBC Platelet - w/Differential [85025-CBCD] 4)  TLB-CK Total Only(Creatine Kinase/CPK) [82550-CK] 5)  TLB-Sedimentation Rate (ESR) [85652-ESR] 6)  Prescription Created Electronically [phone]

## 2010-04-23 ENCOUNTER — Ambulatory Visit (INDEPENDENT_AMBULATORY_CARE_PROVIDER_SITE_OTHER): Payer: Medicare Other | Admitting: Internal Medicine

## 2010-04-23 ENCOUNTER — Encounter: Payer: Self-pay | Admitting: Internal Medicine

## 2010-04-23 DIAGNOSIS — Q742 Other congenital malformations of lower limb(s), including pelvic girdle: Secondary | ICD-10-CM

## 2010-04-23 DIAGNOSIS — L89899 Pressure ulcer of other site, unspecified stage: Secondary | ICD-10-CM | POA: Insufficient documentation

## 2010-04-23 DIAGNOSIS — M2040 Other hammer toe(s) (acquired), unspecified foot: Secondary | ICD-10-CM | POA: Insufficient documentation

## 2010-04-23 HISTORY — DX: Other congenital malformations of lower limb(s), including pelvic girdle: Q74.2

## 2010-04-28 NOTE — Assessment & Plan Note (Signed)
Summary: toe swollen.cbs   Vital Signs:  Patient profile:   77 year old male Weight:      163.2 pounds BMI:     21.32 Temp:     98.4 degrees F oral Pulse rate:   56 / minute Resp:     14 per minute BP sitting:   122 / 74  (left arm) Cuff size:   large  Vitals Entered By: Shonna Chock CMA (April 23, 2010 4:08 PM) CC: 2nd toe swollen on left foot x 4 days, no known injury. Looks like something on the back of toe (? puncture), Lower Extremity Joint pain   CC:  2nd toe swollen on left foot x 4 days, no known injury. Looks like something on the back of toe (? puncture), and Lower Extremity Joint pain.  History of Present Illness:    Onset 1 week ago after walking;he  reports swelling, redness, and decreased ROM, but denies giving away, locking, and popping.  The pain is located in the left 2nd toe.  The pain is described as dull, intermittent, and activity related, only with walking.  The patient denies the following symptoms: fever, rash, photosensitivity, eye symptoms, diarrhea, and dysuria.  Rx: soaking.  Current Medications (verified): 1)  Topamax 25 Mg Tabs (Topiramate) .... Three Tabs Qpm 2)  Synthroid 50 Mcg Tabs (Levothyroxine Sodium) .Marland Kitchen.. 1 Poqd Except On Sunday**labs Due** 3)  Bayer Aspirin 325 Mg Tabs (Aspirin) .... Take 1 Tablet By Mouth Once A Day 4)  Mirtazapine 45 Mg Tabs (Mirtazapine) .Marland Kitchen.. 1 By Mouth Once Daily 5)  Deplin   Tabs (L-Methylfolate Tabs) .... 7.5 Mg 1 By Mouth Qd 6)  I Caps .... 2 Tabs Daily 7)  Flaxseed Oil 1300mg  .... Daily 8)  Wellbutrin Xl 300 Mg  Tb24 (Bupropion Hcl) .Marland Kitchen.. 1 By Mouth Qd 9)  Sertraline Hcl 100 Mg  Tabs (Sertraline Hcl) .... Take 2 Tabs Qd 10)  Simvastatin 40 Mg  Tabs (Simvastatin) .Marland Kitchen.. 1 Qhs 11)  Vitamin D3 2000 Unit Caps (Cholecalciferol) .Marland Kitchen.. 1 By Mouth Once Daily 12)  Calcium 600 Mg Tabs (Calcium) .Marland Kitchen.. 1 By Mouth Two Times A Day 13)  [name] 50 Mg/5gm Gel (Testosterone) .... As Directed 14)  Nevanac 0.1 % Susp (Nepafenac) .Marland Kitchen.. 1  Drop in Right Eye Two Times A Day 15)  Patanol 0.1 % Soln (Olopatadine Hcl) .... 2 Drops As Needed 16)  Fish Oil 1200 Mg Caps (Omega-3 Fatty Acids) .Marland Kitchen.. 1 By Mouth Once Daily 17)  Acetaminophen-Codeine #3 300-30 Mg Tabs (Acetaminophen-Codeine) .Marland Kitchen.. 1 Every 4-6 Hrs As Needed For Pain  Allergies: 1)  * Latex  Review of Systems General:  Denies chills, fever, and sweats.  Physical Exam  General:  in no acute distress; alert,appropriate and cooperative throughout examination Eyes:  No corneal or conjunctival inflammation or hemorrhages  noted.  Heart:  Normal rate and regular rhythm. S1 accentuated ;  S2 normal without gallop, murmur, click, rub. Pulses:  R and L dorsalis pedis and posterior tibial pulses are  decreased  equal bilaterally Extremities:  Finger clubbing & pallor .Toenails  deformed .hammering of 2nd L toe with pressure sore vs traumatic hemorrhage  Skin:  Feet cool with plethora   Impression & Recommendations:  Problem # 1:  PRESSURE ULCER OTHER SITE (ICD-707.09)  2nd L toe , ? due to # 2  Orders: Podiatry Referral (Podiatry)  Problem # 2:  HAMMER TOE (ICD-755.66)  Orders: Podiatry Referral (Podiatry)  Complete Medication List: 1)  Topamax 25 Mg Tabs (Topiramate) .... Three tabs qpm 2)  Synthroid 50 Mcg Tabs (Levothyroxine sodium) .Marland Kitchen.. 1 poqd except on sunday**labs due** 3)  Bayer Aspirin 325 Mg Tabs (Aspirin) .... Take 1 tablet by mouth once a day 4)  Mirtazapine 45 Mg Tabs (Mirtazapine) .Marland Kitchen.. 1 by mouth once daily 5)  Deplin Tabs (L-methylfolate tabs) .... 7.5 mg 1 by mouth qd 6)  I Caps  .... 2 tabs daily 7)  Flaxseed Oil 1300mg   .... Daily 8)  Wellbutrin Xl 300 Mg Tb24 (Bupropion hcl) .Marland Kitchen.. 1 by mouth qd 9)  Sertraline Hcl 100 Mg Tabs (Sertraline hcl) .... Take 2 tabs qd 10)  Simvastatin 40 Mg Tabs (Simvastatin) .Marland Kitchen.. 1 qhs 11)  Vitamin D3 2000 Unit Caps (Cholecalciferol) .Marland Kitchen.. 1 by mouth once daily 12)  Calcium 600 Mg Tabs (Calcium) .Marland Kitchen.. 1 by mouth two  times a day 13)  [name] 50 Mg/5gm Gel (Testosterone) .... As directed 14)  Nevanac 0.1 % Susp (Nepafenac) .Marland Kitchen.. 1 drop in right eye two times a day 15)  Patanol 0.1 % Soln (Olopatadine hcl) .... 2 drops as needed 16)  Fish Oil 1200 Mg Caps (Omega-3 fatty acids) .Marland Kitchen.. 1 by mouth once daily 17)  Acetaminophen-codeine #3 300-30 Mg Tabs (Acetaminophen-codeine) .Marland Kitchen.. 1 every 4-6 hrs as needed for pain  Patient Instructions: 1)  keep foot elevated as much as possible; avoid prolonged walking until seen by Podiatrist.   Orders Added: 1)  Podiatry Referral [Podiatry] 2)  Est. Patient Level III [62130]

## 2010-05-26 ENCOUNTER — Other Ambulatory Visit: Payer: Self-pay

## 2010-05-26 MED ORDER — SIMVASTATIN 40 MG PO TABS: 40.0000 mg | ORAL_TABLET | Freq: Every day | ORAL | Status: DC

## 2010-05-26 NOTE — Telephone Encounter (Signed)
Patient needs to schedule CPX/Fasting labs  

## 2010-06-30 NOTE — Procedures (Signed)
[facility]                              EXERCISE TREADMILL   CAYNE, YOM                    MRN:          [id number]  DATE:07/06/2006                            DOB:          May 25, 1932    PRIMARY CARE PHYSICIAN:  Titus Dubin. Alwyn Ren, MD,FACP,FCCP.   PROCEDURE:  Exercise tolerance test using standard Bruce protocol.   INDICATIONS:  Mr. Errington is a 77 year old gentleman with  cerebrovascular disease.  He presented with progressive fatigue without  clear-cut exertional dyspnea or chest discomfort.  Heart rate was noted  to be 46 beats per minute on only an ophthalmic beta blocker.  Plan  stress test to assess exercise capacity and chronotropic competency.   FINDINGS:  Patient exercised for 3 minutes and 22 seconds of the  standard Bruce protocol, achieving a peak heart rate of 103 beats per  minute, which is 69% of his predicted maximal.  Blood pressure was  136/80 and increased to 206/91, declining promptly with rest.  He did  not have any chest discomfort or dyspnea.  It appeared that stage I was  the most exercise that he had seen in quite some time.   No evidence of ischemia to work load achieved.  Very poor exercise  tolerance.   IMPRESSION/RECOMMENDATIONS:  No evidence of ischemia.  Appropriate  chronotropic response.  I suspect he has profound deconditioning.  Recommended brisker walks.     Salvadore Farber, MD  Electronically Signed    WED/MedQ  DD: 07/06/2006  DT: 07/06/2006  Job #: [id number]   cc:   Titus Dubin. Alwyn Ren, MD,FACP,FCCP

## 2010-06-30 NOTE — Assessment & Plan Note (Signed)
[facility]                         GASTROENTEROLOGY OFFICE NOTE   GUAGE, EFFERSON                    MRN:          [id number]  DATE:08/23/2006                            DOB:          09-Jun-1932    REASON FOR CONSULTATION:  Colonoscopy.   Mr. Jose Leach is a pleasant 77 year old white male referred back through  the courtesy of Dr. Alwyn Ren for colonoscopy.   FAMILY HISTORY:  Pertinent for his parent that had colon cancer.  Both  parents also had heart disease.   Mr. Jose Leach last underwent colonoscopy in 2002.  He complains of mild  constipation.  At times he has seen small amounts of blood on the toilet  tissue.  He attributes this to his constipation.  He denies abdominal  pain or melena.   PAST MEDICAL HISTORY:  Pertinent for:  1. Emphysema.  2. Thyroid disease.  3. Arthritis.  4. Anxiety.  5. Chronic headaches.  6. He is status post appendectomy, endarterectomy, hip replacement,      and laminectomy.   MEDICATIONS:  Include Synthroid, aspirin, Wellbutrin, Zoloft, AndroGel,  Topamax, Deplin, Patanol, Seroquel, Remeron, Klonopin, Vytorin.   HE HAS NO ALLERGIES.   He smokes.  He drinks rarely.  He is married and retired.   REVIEW OF SYSTEMS:  Positive for urinary frequency, joint pains, and  occasional pyrosis.   EXAMINATION:  Pulse 60.  Blood pressure 100/54.  Weight 170.  HEENT: EOMI. PERRLA. Sclerae are anicteric.  Conjunctivae are pink.  NECK:  Supple without thyromegaly, adenopathy or carotid bruits.  CHEST:  Clear to auscultation and percussion without adventitious  sounds.  CARDIAC:  There is a 1 to 2 over 6 early systolic murmur at the left  sternal border.  ABDOMEN:  Bowel sounds are normoactive.  Abdomen is soft, non-tender and  non-distended.  There are no abdominal masses, tenderness, splenic  enlargement or hepatomegaly.  EXTREMITIES:  Full range of motion.  No cyanosis, clubbing or edema.  RECTAL:  Deferred.   IMPRESSION:  1. Family history of colorectal cancer.  2. Mild stable constipation.  3. Minimal rectal bleeding likely secondary to hemorrhoids.   RECOMMENDATION:  Colonoscopy.     Barbette Hair. Arlyce Dice, MD,FACG  Electronically Signed    RDK/MedQ  DD: 08/23/2006  DT: 08/23/2006  Job #: [id number]   cc:   Titus Dubin. Alwyn Ren, MD,FACP,FCCP

## 2010-06-30 NOTE — Progress Notes (Signed)
[facility]                        PERIPHERAL VASCULAR OFFICE NOTE   ANTWAN, PANDYA                    MRN:          [id number]  DATE:10/13/2007                            DOB:          23-Jan-1933    REASON FOR EVALUATION:  Claudication and lower extremity peripheral  arterial disease.   HISTORY OF PRESENT ILLNESS:  Mr. Strole is a 77 year old gentleman who  has been followed in the past by Dr. Samule Ohm.  He was last seen in 2008.  He has had a carotid endarterectomy and has lower extremity PAD as well.  For his lower extremity disease, he has been managed conservatively  because of fairly mild symptoms.  An ABI in October 2008 was 0.86 on the  right and 1.0 on the left.   From a symptomatic standpoint, he complains of typical claudication  symptoms involving both calves equally.  He is able to walk for 25  minutes without stopping.  His calf pain resolved rapidly at rest.  He  has not had any ischemic ulcerations or nonhealing wounds.  He has had  some pain on the bottom of the left foot and has callus on both feet.  He has seen a podiatrist.   Current medications include,  1. Flaxseed oil twice daily.  2. Topamax 25 mg 3 times daily.  3. Synthroid 50 mcg daily.  4. Aspirin 162 mg daily.  5. AndroGel daily.  6. ICaps.  7. Vitamin D.  8. Wellbutrin XL 300 mg daily.  9. Sertraline 100 mg 2 daily.  10.Simvastatin 40 mg at bedtime.  11.Mirtazapine 30 mg twice daily.   ALLERGIES:  Include LATEX.   PHYSICAL EXAMINATION:  GENERAL:  The patient is alert and oriented in no  acute distress.  VITAL SIGNS:  Weight is 170 pounds, blood pressure 118/76, heart rate  60, respiratory rate 16.  HEENT:  Normal.  NECK:  Normal carotid upstrokes. No bruits. JVP normal.  CARDIAC:  Regular rate and rhythm with soft systolic ejection murmur, no  gallops or diastolic murmurs.  ABDOMEN:  Soft, nontender, no organomegaly.  EXTREMITIES:  There is no  clubbing, cyanosis or edema.  There is  dependent rubor of the feet.  Femoral pulses are 2+ and equal.  Popliteal pulses 2+ and equal.  Pedal pulses are diminished bilaterally.  Posterior tibialis are 1+, dorsalis pedis are not palpable.   ASSESSMENT:  This is a 77 year old gentleman with lower extremity PAD.  His symptoms are minimally lifestyle limiting.  Would recommend ongoing  risk reduction and medical therapy.  The patient has discontinued  tobacco altogether.  He continues on antiplatelet therapy with aspirin.  I will have a copy of this on any recent lipids, but his goal LDL is  less than 100 with a coronary disease equivalent.   I would like to see Mr. Scholes back in 1 year with followup ABIs at  that time.  If he has progressive symptoms, he was asked to contact our  office sooner.     Veverly Fells. Excell Seltzer, MD  Electronically Signed    MDC/MedQ  DD: 10/13/2007  DT: 10/14/2007  Job #:  [id number]   cc:   Titus Dubin. Alwyn Ren, MD,FACP,FCCP

## 2010-07-03 NOTE — Discharge Summary (Signed)
NAME:  Jose Leach, Jose Leach NO.:  [id number]   MEDICAL RECORD NO.:  [id number]          PATIENT TYPE:  INP   LOCATION:  3312                         FACILITY:  [facility]   PHYSICIAN:  Balinda Quails, M.D.    DATE OF BIRTH:  10-09-1932   DATE OF ADMISSION:  08/03/2005  DATE OF DISCHARGE:  08/04/2005                                 DISCHARGE SUMMARY   PRIMARY ADMITTING DIAGNOSIS:  Severe left internal carotid artery stenosis,  asymptomatic.   ADDITIONAL/DISCHARGE DIAGNOSES:  1.  Severe left internal carotid artery stenosis, asymptomatic.  2.  Hyperlipidemia.  3.  Hypertension.  4.  Chronic obstructive pulmonary disease.  5.  Depression.  6.  History of tobacco abuse.   PROCEDURES PERFORMED:  Left carotid endarterectomy with Dacron patch  angioplasty.   HISTORY:  The patient is a 77 year old male who underwent a lifeline  screening procedure recently and was found to have a left internal carotid  artery stenosis.  He saw Dr. Alwyn Ren for evaluation and underwent a repeat  Doppler study which confirmed a severe left internal carotid artery  stenosis.  He has had no neurologic symptoms.  He was referred to Dr. Liliane Bade for evaluation and Dr. Madilyn Fireman recommended proceeding with a left  carotid endarterectomy to decrease his risk of stroke.  He explained the  risks, benefits and alternatives of surgery to the patient and he did agree  to proceed.   HOSPITAL COURSE:  Jose Leach was admitted to [facility] on August 03, 2005, and underwent a left carotid endarterectomy as  described in detail above performed by Dr. Madilyn Fireman.  He tolerated the  procedure well and was transferred to the step-down unit in stable  condition.  Postoperatively he has done well.  Initially, he was mildly  hypotensive and required atropine and hydration.  However, his blood  pressure has stabilized and presently he is running in the 110s to 130s.  His heart rate has remained stable  in the 50s and 60s which is his baseline.  He has been seen and evaluated on postop day #1 and at this point he is  voiding without difficulty after removal of his Foley.  He is tolerating a  regular diet.  He is having normal bowel and bladder function.  His surgical  incision sites are all healing well.  He is neurologically intact.   LABORATORY DATA:  On postop day #1 show hemoglobin of 15.4, hematocrit 45.9,  platelets 139, white count 8.6.  Sodium 140, potassium 3.9, BUN 17,  creatinine 1.4.  He will be mobilized this morning and provided he is  ambulating well and his blood pressure remains stable and no other acute  changes occur, he will be ready for discharge later in the day today, August 04, 2005.   DISCHARGE MEDICATIONS:  1.  Vytorin 10/20 nightly.  2.  Synthroid 50 mcg daily except 25 mcg on Sunday.  3.  Aspirin 325 mg daily.  4.  Wellbutrin 3 mg daily.  5.  Zoloft 200 mg daily.  6.  AndroGel 50 mg topically daily.  7.  Topamax 75 mg q.a.m. and 50 mg q.p.m.  8.  Patanol 1 drop both eyes b.i.d.  9.  Seroquel 100 mg nightly.  10. Remeron 30 mg nightly.  11. Clonazepam 0.25 mg nightly.  12. Quinine sulfate 325 mg nightly.  13. ICAPS 2 daily.  14. Flax seed oil 2 daily.   DISCHARGE INSTRUCTIONS:  He is asked to refrain from driving, heavy lifting  or strenuous activity.  He may continue ambulating daily and using his  incentive spirometer.  He may shower daily and clean his incisions with soap  and water.  He will continue his same preoperative diet.   DISCHARGE FOLLOWUP:  He will see Dr. Madilyn Fireman back in the office on September 02, 2005, at 1:20 p.m..  He will contact our office in the interim if he  experiences any problems or has questions.      Coral Ceo, P.A.      Balinda Quails, M.D.  Electronically Signed    GC/MEDQ  D:  08/04/2005  T:  08/04/2005  Job:  [id number]   cc:   Titus Dubin. Alwyn Ren, M.D. [facility]  [phone] [place]  [place] [address]   C.  Lesia Sago, M.D.  Fax: [phone]

## 2010-07-03 NOTE — Assessment & Plan Note (Signed)
[facility]                        [facility] OFFICE NOTE   BEREN, YNIGUEZ                    MRN:          [id number]  DATE:05/30/2006                            DOB:          October 19, 1932    Haroldine Laws was seen May 30, 2006 complaining of sore throat and  ear pain present for several months.   Both are intermittent and described as sharp.  The sore throat is  actually localized to the larynx area, particularly on the left, and is  worse after voice use.  There is no radiation; there are no associated  constitutional symptoms or signs of rhinosinusitis.  The ear pain is  described as sharp and lasting minutes; again, without radiation.   He does have hypothyroidism, is on Synthroid.  He has had an  endarterectomy on the left.   Weight is down 3 pounds to 173.6, he is afebrile.   There is no obvious dental pathology.  I can appreciate no  lymphadenopathy about the head, neck, or axilla.  The thyroid is small  without definite nodules.  There is increased cerumen in the right otic  canal.   The duration of symptoms warrants a Otolaryngologic referral to Dr Hermelinda Medicus ,who has treated him for cerumen impactions remotely.A TSH will  be collected.     Titus Dubin. Alwyn Ren, MD,FACP,FCCP  Electronically Signed    [facility]/MedQ  DD: 05/31/2006  DT: 05/31/2006  Job #: [id number]

## 2010-07-03 NOTE — Op Note (Signed)
NAME:  Jose Leach, Jose Leach                     ACCOUNT NO.:  [id number]   MEDICAL RECORD NO.:  [id number]                   PATIENT TYPE:  INP   LOCATION:  0002                                 FACILITY:  [facility]   PHYSICIAN:  Georges Lynch. Darrelyn Hillock, M.D.             DATE OF BIRTH:  15-Apr-1932   DATE OF PROCEDURE:  03/21/2003  DATE OF DISCHARGE:                                 OPERATIVE REPORT   SURGEON:  Georges Lynch. Darrelyn Hillock, M.D.   ASSISTANT:  Madlyn Frankel. Charlann Boxer, M.D.   PREOPERATIVE DIAGNOSIS:  Severe degenerative arthritis, left hip.   POSTOPERATIVE DIAGNOSIS:  Severe degenerative arthritis, left hip.   OPERATION/PROCEDURE:  Left total hip arthroplasty utilizing the [employer]  system.   COMPONENTS:  We utilized the porous-coated components.  The sizes used were  as follows:  Size 58 mm PSL cup.  The bone screws were two cancellous bone  screws.  We utilized a 10-degree polyethylene liner, 36 mm in diameter.  The  Secur-Fit plus size 10 stem was used in the femur.  We utilized a +5 C-  tapered femoral head.   DESCRIPTION OF PROCEDURE:  Under spinal anesthesia, routine orthopedic prep  and draping of the left lower extremity was carried out.  The patient had 1  g of IV Ancef preoperatively.  At this time posterolateral approach to the  hip was carried out.  Bleeders identified and cauterized.  Following that, I  then inserted the self-retaining retractors and I released the iliotibial  band.  After this was done, I then released the Zickel band.  I then at this  time incised the external rotators with great care taken to protect the  underlying sciatic nerve.  After this, I exposed the capsule, did a complete  capsulectomy, dislocated the femoral head, and then amputated the femoral  head at the appropriate neck length.  I then reamed and rasped the femoral  shaft up to a size 10 Secur-Fit Plus.  I reamed the distal tip to a +14.5.  After this was done, we then went on a reamed the  acetabulum up to a size 58  mm cup.  We then thoroughly irrigated out the area.  I removed all the loose  spurs from the acetabulum and then inserted my permanent cup.  After this  was done, before the screws were inserted, we went through trials once  again.  We then utilized the bur to bur down the rim of the acetabulum.  Following this, we inserted our permanent polyethylene liner.  I then went  through range of motion again and finally selected a +5 C-tapered head which  was the most stable.  We initially tried a +0 but it was not as stable as a  +5.  We inserted our permanent Secur-Fit femoral stem and then snapped on  the permanent +5 C-tapered head.  We cleared the acetabulum, reduced the  hip, took the hip through range of  motion and had excellent stability.  Note  the size of the polyethylene insert was a 36 mm diameter.  The size of the C-  tapered head was a 36 mm diameter.  We thoroughly irrigated out the area,  reapproximated the soft tissue structure in the usual fashion.  Skin was  closed with metal staples and a sterile Neosporin dressing was applied.                                               Ronald A. Darrelyn Hillock, M.D.    RAG/MEDQ  D:  03/21/2003  T:  03/21/2003  Job:  [id number]

## 2010-07-03 NOTE — Assessment & Plan Note (Signed)
[facility]                            CARDIOLOGY OFFICE NOTE   GOLDMAN, BIRCHALL                    MRN:          [id number]  DATE:05/26/2006                            DOB:          07-01-1932    HISTORY OF PRESENT ILLNESS:  Mr. Steig is a 77 year old gentleman  whom I met a year ago as preoperative consultation before carotid  endarterectomy.  He has recovered very nicely from that.  However, his  wife has been concerned that he has had progressive fatigue prompting  him to stop his usual walks, and be less active in caring for his  grandchildren.  He has not, however, had any clear cut exertional  dyspnea or chest discomfort.  He further has had no orthopnea, PND,  edema, or claudication.   His current medications are:  1. Synthroid 50 mcg per day, except 25 mcg on Sunday.  2. Aspirin 325 mg daily.  3. Wellbutrin 300 mg daily.  4. Zoloft 200 mg daily.  5. AndroGel 50 mg daily.  6. Topamax 75 mg twice daily.  7. Deplin 7.5 mg daily.  8. Patanol eye drops.  9. Seroquel 50 mg nightly.  10.Remeron 30 mg nightly.  11.Clonazepam 0.5 mg nightly.  12.Vytorin 10/21 nightly.  13.Multiple supplements.   PHYSICAL EXAMINATION:  He is generally well appearing, in no distress.  Heart rate 47.  Blood pressure 137/70.  Weight of 174 pounds.  He has no jugular venous distention, thyromegaly, or lymphadenopathy.  LUNGS:  Clear to auscultation.  He has a nondisplaced point of maximal cardiac impulse.  There is a  regular rate and rhythm without murmur, rub, or gallop.  ABDOMEN:  Soft, non-distended, non-tender.  There is no  hepatosplenomegaly.  Bowel sounds are normal.  EXTREMITIES:  Warm without clubbing, cyanosis, edema, or ulceration.   Electrocardiogram demonstrates sinus bradycardia at 46 beats per minute  with a premature atrial contraction versus marked sinus arrhythmia, and  is otherwise normal.   IMPRESSION/RECOMMENDATIONS:  Decreased  exercise tolerance in the setting  of marked sinus bradycardia.  He is on a ocular beta blocker, which may  be contributing to his slow heart  rate.  It is not clear that this is related to his fatigue, however.  To  assess this, we will check exercise tolerance test, and assess his  chronotropic response.     Salvadore Farber, MD  Electronically Signed    WED/MedQ  DD: 05/26/2006  DT: 05/26/2006  Job #: [id number]   cc:   Titus Dubin. Alwyn Ren, MD,FACP,FCCP

## 2010-07-03 NOTE — H&P (Signed)
NAME:  Jose Leach, Jose Leach                     ACCOUNT NO.:  [id number]   MEDICAL RECORD NO.:  [id number]                   PATIENT TYPE:  INP   LOCATION:  NA                                   FACILITY:  [facility]   PHYSICIAN:  Georges Lynch. Gioffre, M.D.             DATE OF BIRTH:  1932/12/24   DATE OF ADMISSION:  03/21/2003  DATE OF DISCHARGE:                                HISTORY & PHYSICAL   HISTORY:  The patient has had left hip pain for the past two years. The pain  has gotten increasingly worse over the past three months. The pain is so  severe now that he has requiring a cane to ambulate. He has taken Celebrex  for the past several months without relief and the patient elects to proceed  with a left total hip arthroplasty.   ALLERGIES:  None known.   PAST MEDICAL HISTORY:  1. Migraine headaches.  2. Anxiety.  3. COPD.  4. Hemorrhoids.  5. Degenerative arthritis.   PREVIOUS SURGICAL HISTORY:  The patient had an appendectomy in 1945,  laminectomy in 1997.   CURRENT MEDICATIONS:  1. Synthroid 50 mcg daily; on Sunday he takes 25 mcg.  2. Cytomel 12.5 mcg daily.  3. Plavix 75 mg daily.  4. Quinine sulfate 260 mg daily.  5. Klonopin 0.5 mg daily.  6. Seroquel 150 mg daily.  7. Wellbutrin XL 300 mg daily.  8. Remeron 45 mg daily.  9. Zoloft 100 mg daily.  10.      AndroGel (a testosterone gel) 50 mg daily.  11.      Topamax 125 mg daily.  12.      Lipitor 10 mg daily.   FAMILY HISTORY:  Mother had hypertension, high blood pressure, diabetes, and  history of stroke. Father had colorectal cancer and high blood pressure.   REVIEW OF SYSTEMS:  GENERAL: Denies weight change, fever, chills, or  fatigue. HEENT: Denies headache, visual changes, tinnitus, hearing loss, or  sore throat. CARDIOVASCULAR: Denies chest pain, palpitation, shortness of  breath, orthopnea. PULMONARY: Denies dyspnea, wheezing, cough, sputum  production, or hemoptysis. GI: Denies dysphagia, nausea,  vomiting,  hematemesis, or abdominal pain. GU: Denies dysuria, frequency, urgency, or  hematuria. ENDOCRINE:  Denies polyuria, polydipsia, appetite change, heat or  cold intolerance. MUSCULOSKELETAL: The patient has severe left hip pain.  NEUROLOGIC: Denies dizziness, vertigo, syncope, seizure, numbness, or  paresthesia. SKIN: Denies itching, rash, masses, or molds.   PHYSICAL EXAMINATION:  VITAL SIGNS: Temperature 98.0, pulse 64, respirations  18, blood pressure 110/80 right arm sitting.  GENERAL: A 77 year old male in no acute distress.  HEENT: PERRL. EOMs intact. Pharynx clear. TMs intact.  NECK: Supple without masses.  CHEST: Clear to auscultation bilaterally without rales, rhonchi, or wheezes  noted.  HEART: Regular rate and rhythm without murmur.  ABDOMEN: Positive bowel sounds, soft, and nontender.  EXTREMITIES: Examination of his left hip reveals the left leg to be 1/2-inch  shorter than the right.  He has decreased range of motion with pain.  SKIN: Warm and dry.   X-ray of his left hip reveals complete collapse of the joint.   IMPRESSION:  Degenerative arthritis, left hip.   PLAN:  The patient is to be admitted to [facility] on March 21, 2003, to undergo a left total hip arthroplasty.     Ebbie Ridge. Paitsel, P.A.                     Ronald A. Darrelyn Hillock, M.D.    Tilden Dome  D:  03/15/2003  T:  03/15/2003  Job:  [id number]

## 2010-07-03 NOTE — Assessment & Plan Note (Signed)
[facility]                        [facility] OFFICE NOTE   ABHIJOT, STRAUGHTER                    MRN:          [id number]  DATE:05/31/2006                            DOB:          1932-05-22    ADDENDUM:  After evaluation of his complaints of sore throat and ear  ache, he mentioned fatigue. Dr. [name] his psychiatrist had recommended  possibly being evaluated for anemia.   He denies any GI symptoms. He has received a letter from Dr. Melvia Heaps recommending follow up of his colonoscopy. Because of his recent  endarterectomy that has been postponed for the time being. His last  colonoscopy was in 2002.   Specifically he denies melena or rectal bleeding.   In reference to the fatigue he denies any temperature intolerance or  bowel change.   He also wonders whether the ear pain might be related to his shoulder,  as the pain is below the ear.   He does have crepitus in the right shoulder, more so than the left but  there is essentially normal range of motion. Opposition testing of the  digits reveals normal strength. Deep tendon reflexes are also normal.   He has no organomegaly or masses. The abdomen is nontender.   CBC and differential, stool cards, SGOT and SGPT will be drawn and  results sent to him.     Titus Dubin. Alwyn Ren, MD,FACP,FCCP  Electronically Signed    [facility]/Med[facility]  DD: 05/31/2006  DT: 05/31/2006  Job #: [id number]

## 2010-07-03 NOTE — H&P (Signed)
[facility]  Patient:    Jose, ENGELBERT                  MRN: [id number] Adm. Date:  [id number] Attending:  Lorre Nick CC:         Titus Dubin. Alwyn Ren, M.D. [facility]  Marlan Palau, M.D.  Phillip Heal, Psychiatry   History and Physical  DATE OF BIRTH:  April 16, 1932  CHIEF COMPLAINT:  Episodic numbness.  HISTORY OF PRESENT ILLNESS:  The patient is a 77 year old white male who developed an episode of numbness in the left arm and left leg at 10:30 a.m. that lasted for about one or two minutes an identical episode around noon time.  Both episodes resolved.  She also reports mild headache today.  There was no syncope, no weakness, no chest pain.  Has never had the above mentioned symptoms before.  PAST MEDICAL HISTORY:  Hypothyroidism, tremor, migraine headaches, anxiety, back surgery in 1996, appendectomy.  CURRENT MEDICATIONS: 1. Duradrin p.r.n. 2. Synthroid. 3. Cytomel. 4. Zyprexa. 5. Celexa. 6. Klonopin.  All dosages unknown.  SOCIAL HISTORY:  Drinks wine two glasses per day and did not drink for a couple days.  FAMILY HISTORY:  The mother has cerebrovascular accident, the sister has high blood pressure.  The mother also has coronary artery disease.  ALLERGIES:  None.  REVIEW OF SYSTEMS:  Had more frequent migraine headaches over the past week. Chronic tremor.  He is under a good deal of stress due to fathers terminal illness.  The patient had a headache yesterday and migraines usually come with aura.  No syncope, no chest pain, no blood in the stool.  The rest is negative or as above.  PHYSICAL EXAMINATION:  GENERAL:  He is alert, oriented and cooperative.  He is depressed chronically.  VITAL SIGNS:  Pulse oximetry 97% on room air, blood pressure 148/93, pulse 62, respirations 18.  He is in no acute distress.  HEENT:  Moist mucosa.  NECK:  Supple, no bruits, no thyromegaly.  LUNGS:  Clear to auscultation and  percussion.  HEART:  S1 and S2, no enlargement to percussion.  ABDOMEN:  Soft and nontender.  No organomegaly or masses felt.  EXTREMITIES:  Lower extremities without edema.  The calves are nontender.  NEUROLOGIC:  Cranial nerves 2-12 are nonfocal.  Muscle strength within normal limits.  Grip is symmetric bilaterally.  He has tremor of the head and upper extremities.  There is no facial droop.  SKIN: Skin with aging changes.  LABS:  EKG revealed sinus bradycardia, heart rate 56.  Chest x-ray is pending.  CT scan by verbal report revealed no acute changes.  Sodium 138, potassium 4.8, chloride 105, hemoglobin 18.  ASSESSMENT AND PLAN: 1. Transient ischemic attack versus complicated migraine.  Receive aspirin    IV fluids and oxygen.  Obtain MRI and MRA.  Obtain carotid Doppler and    cardiac echocardiogram.  May need a neurology consultation. 2. He will need to discontinue drinking.  I dont think he is in any danger    of having DTs.  His drinking has been mild. 3. Hypothyroid.  Check TSH level. 4. Elevate blood pressure and will monitor. 5. Chronic tremor.  Will continue with his current therapy. 6. Elevate hemoglobin will repeat tomorrow. 7. Chronic depression will continue current therapy. DD:  03/29/00 TD:  03/29/00 Job: [id number]

## 2010-07-03 NOTE — Discharge Summary (Signed)
NAME:  Jose Leach, Jose Leach NO.:  [id number]   MEDICAL RECORD NO.:  [id number]                   PATIENT TYPE:  IPS   LOCATION:  4146                                 FACILITY:  [facility]   PHYSICIAN:  Jose Leach, M.D.                DATE OF BIRTH:  Apr 11, 1932   DATE OF ADMISSION:  03/25/2003  DATE OF DISCHARGE:  04/02/2003                                 DISCHARGE SUMMARY   DISCHARGE DIAGNOSES:  1. Left total hip replacement secondary to osteoarthritis March 21, 2003.  2. Anemia.  3. Pain management.  4. Coumadin for deep vein thrombosis prophylaxis.  5. Anxiety with depression.  6. Hypothyroidism.  7. Hyperlipidemia.  8. History of transient ischemic attacks.  9. Migraine headaches.  10.      Essential tremors.   HISTORY OF PRESENT ILLNESS:  A 77 year old white male admitted to [facility] February 3 with advanced left hip pain x2 years and no change  with conservative care.  He underwent a left total hip replacement on  March 21, 2003 per Dr. Darrelyn Leach.  Placed on Coumadin for deep vein  thrombosis prophylaxis and partial weightbearing.  Postoperative anemia 8.7.  Minimal assist for bed mobility, minimal assist ambulation.  Latest  chemistries with hemoglobin of 9, hematocrit 27.2 on March 25, 2003.  Admitted for a comprehensive rehab program.   PAST MEDICAL HISTORY:  See discharge diagnoses.   PAST SURGICAL HISTORY:  1. Appendectomy.  2. Lumbar laminectomy.   ALLERGIES:  None.   HABITS:  Denies alcohol.  Remote smoker.   MEDICATIONS PRIOR TO ADMISSION:  1. Synthroid.  2. Cytomel.  3. Plavix.  4. Quinine sulfate.  5. Klonopin.  6. Seroquel.  7. Wellbutrin.  8. Remeron.  9. Zoloft.  10.      AndroGel.  11.      Topamax.  12.      Lipitor.   SOCIAL HISTORY:  He lives with wife in [place].  Independent prior to  admission.  One-level home, three steps to entry.  Wife can assist on  discharge.   HOSPITAL COURSE:   The patient did well while on rehabilitation services with  therapies initiated on a b.i.d. basis.  The following issues were followed  during the patient's rehab course.  Pertaining to [name]'s left total  hip replacement, surgical site healing nicely.  Partial weightbearing with  hip precautions.  Neurovascular sensation remained intact.  Home health  therapies had been arranged and the patient would follow up with Dr.  Darrelyn Leach.  Pain management ongoing with the use of sustained released  OxyContin tapered accordingly.  Postoperative anemia.  The patient was  transfused during his rehab stay.  Hemoglobin 8.4 with 2 units of packed red  blood cells tolerated well.  Latest hemoglobin February 14 of 11.2.  He  would remain on Coumadin for deep vein thrombosis prophylaxis until April 18, 2003 [facility].  Latest INR of 2.  He had a  long history of anxiety with depression followed by Dr. Madaline Leach of  psychiatry services.  He remained on his Wellbutrin, Klonopin, Zoloft, and  Seroquel.  He had a history of essential tremor maintained on Topamax per  neurology services, Dr. Lesia Leach.  It was advised he continue his  hormone supplement for hypothyroidism.  Overall for his functional mobility,  the patient was minimal assist for ambulation with a rolling walker, needing  some assistance for lower body bathing and dressing.  He would be discharged  home on February 15 with his wife with home health therapies arranged.   Latest labs February 14 of hemoglobin 11.2, hematocrit 33.2.  INR of 2.  Sodium 137, potassium 3.7, BUN 19, creatinine 1.2.   DISCHARGE MEDICATIONS:  1. Coumadin.  Latest dose 6 mg to be completed April 18, 2003.  2. Wellbutrin XL 300 mg daily.  3. Klonopin 0.5 mg bedtime.  4. Cytomel 25 mcg 1/2 tablet daily.  5. Remeron 30 mg bedtime.  6. Quinine sulfate 260 mg bedtime.  7. Zoloft 100 mg daily.  8. Synthroid 50 mcg daily except 1/2 tablet  on Sundays.  9. Seroquel 150 mg at bedtime.  10.      Testosterone 5 g daily.  11.      Lipitor 10 mg daily.  12.      Topamax daily.  13.      Trinsicon 1 capsule twice daily.  14.      OxyContin continued release 10 mg twice daily x1 week and oxycodone     as needed breakthrough pain.   DIET:  Regular.   SPECIAL INSTRUCTIONS:  Home health nurse to check INR on Thursday, February  17, per [facility].  The patient should follow up with Dr.  Darrelyn Leach, call for an appointment, Dr. Marga Leach [facility], and  Dr. Madaline Leach of psychiatry services.      Jose Leach, P.A.                     Jose Leach, M.D.    DA/MEDQ  D:  04/01/2003  T:  04/01/2003  Job:  [id number]   cc:   Jose Leach. Jose Leach, M.D. [facility]   [facility]. [id number]  [place]  [place] [address]  Fax: [phone]   Jose Leach, M.D.  [facility]  [address]  [address]  [place]  [place] [address]  Fax: [phone]

## 2010-07-03 NOTE — Op Note (Signed)
NAME:  Jose Leach, Jose Leach NO.:  [id number]   MEDICAL RECORD NO.:  [id number]          PATIENT TYPE:  INP   LOCATION:  2550                         FACILITY:  [facility]   PHYSICIAN:  Balinda Quails, M.D.    DATE OF BIRTH:  February 01, 1933   DATE OF PROCEDURE:  08/03/2005  DATE OF DISCHARGE:                                 OPERATIVE REPORT   SURGEON:  P Bud Face, MD   ASSISTANT:  Coral Ceo, PA.   ANESTHETIC:  General endotracheal.   PREOPERATIVE DIAGNOSIS:  Severe left internal carotid artery stenosis.   POSTOPERATIVE DIAGNOSIS:  Severe left internal carotid artery stenosis.   PROCEDURE:  Left carotid endarterectomy Dacron patch angioplasty.   CLINICAL NOTE:  Mr. 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  He is referred for evaluation and surgical management.  Left  carotid endarterectomy recommended, the patient consented for surgery.   INDICATIONS FOR SURGERY:  Reduction stroke risk.  Major morbidity mortality  1-2% to include but not limited to MI, CVA, cranial nerve injury and death.   OPERATIVE PROCEDURE:  The patient brought to the operating room in stable  hemodynamic condition.  Placed under general endotracheal anesthesia.  Foley  catheter arterial line placed.  Left neck prepped and draped in sterile  fashion.   Curvilinear skin incision made along the anterior border left sternomastoid  muscle.  Dissection carried down through subcutaneous tissue with  electrocautery.  Platysma divided.  Deep dissection carried down to expose  the carotid bifurcation.  Facial vein ligated with 3-0 silk and divided.  Carotid bifurcation exposed.  Common carotid artery mobilized down to the  omohyoid muscle and encircled with vessel loop.  The superior thyroid and  external carotid were freed and encircled with vessel loops.  The internal  carotid artery followed  distally up to the posterior belly of digastric  muscle encircled with vessel loop.   The carotid bifurcation evaluated.  There was plaque at the carotid  bifurcation extending approximately 2 cm into the left internal carotid  artery.  The patient administered 7000 units heparin intravenously.  Adequate circulation time permitted.  The carotid vessels controlled clamps.  Longitudinal arteriotomy made in the distal common carotid artery.  The  arteriotomy extended across carotid bulb up into the internal carotid  artery.  A shunt was then inserted.  The carotid bifurcation did reveal  plaque disease with a high-grade left internal carotid artery stenosis.   The plaque removed with an endarterectomy elevator.  The endarterectomy  carried down into the common carotid artery where the plaque was divided  transversely with Potts scissors.  The plaque then raised up to the bulb and  superior thyroid and external carotid were endarterectomized using an  eversion technique.  The distal internal carotid artery plaque feathered out  well.  Fragments of plaque removed with fine forceps.  Site irrigated with  heparin saline solution.   A patch angioplasty endarterectomy site carried out with a Finesse Dacron  patch using running 6-0 Prolene suture.  Shunt  then removed.  All vessels  well flushed.  Clamps removed directing initial antegrade flow up the  external carotid artery.  Following this internal carotid was released.  Excellent pulse and Doppler signal in the distal internal carotid artery.   Adequate hemostasis obtained.  Sponge and instrument counts correct.  The  patient administered 50 mg protamine intravenously.   Sternomastoid fascia closed with running 2-0 Vicryl suture.  Platysma closed  with running 3-0 Vicryl suture.  Skin closed with 4-0 Monocryl.  Steri-  Strips applied.  The patient transferred recovery in stable condition.  No  apparent complications.      Balinda Quails,  M.D.  Electronically Signed     [facility]/MEDQ  D:  08/03/2005  T:  08/04/2005  Job:  [id number]   cc:   Titus Dubin. Alwyn Ren, M.D. [facility]  [phone] [place]  [place] [address]   C. Lesia Sago, M.D.  Fax: [phone]

## 2010-07-03 NOTE — Discharge Summary (Signed)
NAME:  Jose Leach, Jose Leach                     ACCOUNT NO.:  [id number]   MEDICAL RECORD NO.:  [id number]                   PATIENT TYPE:  INP   LOCATION:  0454                                 [facility]ACILITY:  [facility]   PHYSICIAN:  Georges Lynch. Darrelyn Hillock, M.D.             DATE OF BIRTH:  01-Dec-1932   DATE OF ADMISSION:  03/21/2003  DATE OF DISCHARGE:  03/25/2003                                 DISCHARGE SUMMARY   ADMISSION DIAGNOSES:  1. Degenerative arthritis, left hip.  2. Migraine headaches.  3. Anxiety.  4. Chronic obstructive pulmonary disease.  5. History of hemorrhoids.   DISCHARGE DIAGNOSES:  1. Degenerative arthritis, left hip, status post left total hip     arthroplasty.  2. Migraine headaches.  3. Anxiety.  4. Chronic obstructive pulmonary disease.  5. Hemorrhoids.  6. Degenerative arthritis.   PROCEDURE:  The patient was taken to the operating room on March 21, 2003.  He underwent a left total hip arthroplasty.  Surgeon was Windy Fast A. Darrelyn Hillock,  M.D., assistant was Madlyn Frankel. Charlann Boxer, M.D.  Surgery was performed under  spinal anesthesia.   CONSULTATIONS:  1. PT.  2. OT.  3. Rehab.   HISTORY:  The patient is a 77 year old male who has had left hip pain for  the past two years.  Over the past two months the pain has gradually gotten  worse and became severe that the patient was required to use a cane to  ambulate.  He had taken Celebrex in the past, but it was no longer helping  him with his symptoms.  The patient elected to proceed with a left total hip  arthroplasty for pain relief.   LABORATORY DATA:  Admission WBC 8, RBC 5.47, hemoglobin 17, hematocrit 29.8,  platelet count 171.  Admission PT 13.5, INR 1, PTT 34.  Admission  chemistries:  Sodium 141, potassium 4.6, chloride 109, CO2 28, glucose 97,  BUN 32, slightly elevated creatinine at 1.6, calcium 9.9, albumin 3.9.  Admission urinalysis showed a trace of ketones.  The patient's blood type is  O positive, negative  antibody screen.  Admission EKG showed normal sinus  rhythm, heart rate of 63.  Admission chest x-ray showed COPD, otherwise no  active disease.  Admission x-ray of left hip revealed advanced degenerative  arthritis.  Postoperative x-ray of left hip revealed total hip prosthesis in  good alignment.   HOSPITAL COURSE:  The patient was admitted to [facility] and taken  to the operating room.  He underwent the above stated procedure without  complications.  The patient tolerated the procedure well, and was allowed to  return to the recovery room and then to the orthopaedic floor to continue  his postoperative care.  The patient was placed on PCA analgesia for pain  control.  The patient's pain was well controlled.  The PCA was discontinued  on postoperative day #3.  It was felt that the patient could benefit  from an  inpatient rehab.  He was consulted by the rehab department and met their  criteria.  The patient will be transferred to the rehab unit when a bed is  available.  The patient's hemoglobin and hematocrit were followed throughout  his hospitalization.  He had a postoperative drop in his hemoglobin to 8.7,  but the patient did not require a blood transfusion prior to being  transferred to the rehab unit.  A bed became available on postoperative day  #____________.  The patient was transferred to rehab.   DISPOSITION:  The patient was transferred to rehab on March 25, 2003.   TRANSFER MEDICATIONS:  1. Percocet 10/650 one or two q.6h. p.r.n. pain.  2. Robaxin 500 mg one q.6h. p.r.n. muscle spasm.  3. Coumadin per pharmacy protocol.   ACTIVITY:  Touchdown weightbearing with walker.   WOUND CARE:  Keep wound dry and clean.   FOLLOWUP:  The patient is scheduled to follow up with Dr. Darrelyn Hillock two weeks  from the date of surgery.   CONDITION ON TRANSFER:  Stable.     Ebbie Ridge. Paitsel, P.A.                     Ronald A. Darrelyn Hillock, M.D.    Tilden Dome  D:  04/11/2003  T:   04/11/2003  Job:  [id number]

## 2010-07-03 NOTE — Discharge Summary (Signed)
[place]  Patient:    Jose Leach, Jose Leach                  MRN: [id number] Adm. Date:  [id number] Disc. Date: [id number] Attending:  Tresa Garter Dictator:   Cornell Barman, P.A. CC:         Titus Dubin. Alwyn Ren, M.D. [facility]             C. Lesia Sago, M.D.             Dr. [name], psychiatry                           Discharge Summary  DISCHARGE DIAGNOSES: 1. Transient left-sided numbness. 2. Migraine headache.  HISTORY OF PRESENT ILLNESS:  Jose Leach is a 77 year old white male who developed left-sided numbness around 10:30 a.m. on the morning of admission. It lasted for one to two minutes, and then he had a second episode around noon.  Both episodes resolved prior to being seen.  The patient has a history of migraine headaches and reports six migraine headaches in the past two weeks.  PAST MEDICAL HISTORY: 1. Hypothyroidism. 2. Essential tremor. 3. Migraine headache. 4. Anxiety. 5. Status post back surgery in 1996. 6. Status post appendectomy.  HOSPITAL COURSE: #1 - NEUROLOGIC:  The patient was admitted to rule out TIA versus CVA versus complicated migraine headache.  The patient had an MRI of the brain that revealed mild atrophy, small-vessel disease, without any acute infarct.  MRA showed widespread intracranial atherosclerotic change without significant proximal stenosis.  His carotid Dopplers showed no significant ICA stenosis bilaterally, anterior vertebral artery flow bilaterally.  The patients 2-D echo is still pending at the time of this dictation.  The patient had no paresthesias.  The patient is on aspirin, and we have asked him to increase this to a full-strength aspirin.  We will view the 2-D echo and consider whether or not the patient needs Plavix as well.  #2 - MIGRAINE HEADACHE:  As noted, the patient has had increased frequency in his migraine headaches.  The patient has seen Dr. Meryl Crutch in the past, and he may  need to follow up with Dr. Meryl Crutch again.  #3 - CARDIOVASCULAR:  The patients EKG revealed sinus rhythm with a second-degree AV block.  LABORATORY DATA PRIOR TO DISCHARGE:  BMET and CBC were normal.  TSH, B12, and sedimentation rate are still pending.  DISCHARGE MEDICATIONS: 1. Synthroid 50 mcg q.d. 2. Celexa 40 mg q.d. 3. Klonopin 0.5 mg as at home. 4. Zyprexa 2.5 mg as at home. 5. Cytomel 25 mg 1/2 tablet q.d. 6. Aspirin 325 mg q.d.  FOLLOW-UP:  The patient is to follow up with Dr. Alwyn Ren in two to three weeks.  Will also make a decision about Plavix prior to being discharged. DD:  03/31/00 TD:  04/01/00 Job: [id number]

## 2010-07-15 ENCOUNTER — Other Ambulatory Visit: Payer: Self-pay | Admitting: Internal Medicine

## 2010-07-15 NOTE — Telephone Encounter (Signed)
TSH 244.9 

## 2010-08-27 ENCOUNTER — Other Ambulatory Visit: Payer: Self-pay | Admitting: Internal Medicine

## 2010-08-27 NOTE — Telephone Encounter (Signed)
TSH 244.9 

## 2010-09-15 ENCOUNTER — Other Ambulatory Visit: Payer: Self-pay | Admitting: Internal Medicine

## 2010-09-15 NOTE — Telephone Encounter (Signed)
Lipid/Hep 272.4/995.20  

## 2010-09-28 ENCOUNTER — Other Ambulatory Visit: Payer: Self-pay | Admitting: Internal Medicine

## 2010-09-28 DIAGNOSIS — E785 Hyperlipidemia, unspecified: Secondary | ICD-10-CM

## 2010-09-28 DIAGNOSIS — T887XXA Unspecified adverse effect of drug or medicament, initial encounter: Secondary | ICD-10-CM

## 2010-09-28 DIAGNOSIS — E038 Other specified hypothyroidism: Secondary | ICD-10-CM

## 2010-09-29 ENCOUNTER — Other Ambulatory Visit (INDEPENDENT_AMBULATORY_CARE_PROVIDER_SITE_OTHER): Payer: Medicare Other

## 2010-09-29 DIAGNOSIS — E785 Hyperlipidemia, unspecified: Secondary | ICD-10-CM

## 2010-09-29 DIAGNOSIS — E038 Other specified hypothyroidism: Secondary | ICD-10-CM

## 2010-09-29 DIAGNOSIS — T887XXA Unspecified adverse effect of drug or medicament, initial encounter: Secondary | ICD-10-CM

## 2010-09-29 LAB — LIPID PANEL
Cholesterol: 124 mg/dL (ref 0–200)
LDL Cholesterol: 62 mg/dL (ref 0–99)
Total CHOL/HDL Ratio: 3

## 2010-09-29 LAB — HEPATIC FUNCTION PANEL
AST: 19 U/L (ref 0–37)
Albumin: 3.8 g/dL (ref 3.5–5.2)
Alkaline Phosphatase: 35 U/L — ABNORMAL LOW (ref 39–117)
Total Bilirubin: 0.5 mg/dL (ref 0.3–1.2)
Total Protein: 6.1 g/dL (ref 6.0–8.3)

## 2010-09-29 LAB — TSH: TSH: 1.49 u[IU]/mL (ref 0.35–5.50)

## 2010-12-11 ENCOUNTER — Other Ambulatory Visit: Payer: Self-pay | Admitting: Internal Medicine

## 2011-03-07 ENCOUNTER — Other Ambulatory Visit: Payer: Self-pay | Admitting: Internal Medicine

## 2011-03-15 ENCOUNTER — Ambulatory Visit: Payer: Medicare Other | Admitting: Internal Medicine

## 2011-03-16 ENCOUNTER — Encounter: Payer: Self-pay | Admitting: Internal Medicine

## 2011-03-16 ENCOUNTER — Ambulatory Visit (INDEPENDENT_AMBULATORY_CARE_PROVIDER_SITE_OTHER): Payer: Medicare Other | Admitting: Internal Medicine

## 2011-03-16 DIAGNOSIS — G629 Polyneuropathy, unspecified: Secondary | ICD-10-CM

## 2011-03-16 DIAGNOSIS — R259 Unspecified abnormal involuntary movements: Secondary | ICD-10-CM

## 2011-03-16 DIAGNOSIS — R531 Weakness: Secondary | ICD-10-CM

## 2011-03-16 DIAGNOSIS — R5381 Other malaise: Secondary | ICD-10-CM

## 2011-03-16 DIAGNOSIS — R251 Tremor, unspecified: Secondary | ICD-10-CM

## 2011-03-16 DIAGNOSIS — R42 Dizziness and giddiness: Secondary | ICD-10-CM

## 2011-03-16 DIAGNOSIS — G609 Hereditary and idiopathic neuropathy, unspecified: Secondary | ICD-10-CM

## 2011-03-16 DIAGNOSIS — R29818 Other symptoms and signs involving the nervous system: Secondary | ICD-10-CM

## 2011-03-16 NOTE — Patient Instructions (Signed)
.  Share results with Dr [name]

## 2011-03-16 NOTE — Progress Notes (Signed)
Subjective:    Patient ID: Jose Leach, male    DOB: Dec 02, 1932, 78 y.o.   MRN: [id number]  HPI DIZZINESS Onset:1 year ago Context: no specific trigger initially Course:more frequent as of last 4 weeks Duration:seconds - minutes Frequency:1-2 X/week Treatment/response:Aleve ? helps Triggers: Position change:occasionally standing up & ? With waist flexion Benign positional vertigo symptoms:no Straining:no Pain:no Cardiac prodrome: No palpitations, irregular rhythm, heart rate change Neurologic prodrome: No headache, numbness and tingling, weakness, change in coordination (gait/falling) Syncope:no Seizure activity:no Upper respiratory tract infection/extrinsic symptoms:unrelated Associated signs and symptoms: Visual change (blurred/double/loss):no Hearing loss/tinnitus:no Nausea/sweating:no Chest pain: no Dyspnea:no  He has a past medical history of endarterectomy; spinal stenosis; peripheral neuropathy; idiopathic tremor and migraines. His mother had a  hypertension and stroke.       Review of Systems  His wife believes that his tremor has been worse the last several weeks. He last saw his neurologist in July of 2012.  She also states he has had recurrent falls. She denies any head trauma or loss of consciousness.     Objective:   Physical Exam  Gen.: adequately  well-nourished in appearance.Affect flat but cooperative throughout exam. Head: Normocephalic without obvious abnormalities Eyes: No corneal or conjunctival inflammation noted. Ptosis OD Extraocular motion intact. Ears: External  ear exam reveals no significant lesions or deformities. Canals ; wax bilaterally. Hearing is grossly normal bilaterally. Nose: External nasal exam reveals no deformity or inflammation. Nasal mucosa are pink and moist. No lesions or exudates noted.   Mouth: Oral mucosa and oropharynx reveal no lesions or exudates. Teeth in good repair. Neck: No deformities, masses, or tenderness  noted. Range of motion & Thyroid normal Lungs: Normal respiratory effort; chest expands symmetrically. Lungs are clear to auscultation without rales, wheezes, or increased work of breathing. Heart: Normal rate and rhythm. Normal S1 and S2. No gallop, click, or rub.S 4 with slurring; no murmur.                                                                                    Musculoskeletal/extremities: No deformity or scoliosis noted of  the thoracic or lumbar spine. No clubbing, cyanosis, edema, or deformity noted. Range of motion  normal .Tone & strength  normal.Joints normal. Nail health  good. Vascular: Carotid, radial artery, dorsalis pedis and  posterior tibial pulses are full and equal. No bruits present. Neurologic: Alert and oriented x3. Deep tendon reflexes symmetrical and normal. Gait is slow slightly broad. Coarse tremor of the head persists. He has intermittent coarse tremor of the left hand. Slight past-pointing with finger-nose testing. Negative Romberg testing         Skin: Intact without suspicious lesions or rashes. Lymph: No cervical, axillary  lymphadenopathy present. Psych: Mood and affect depressed  Assessment & Plan:    #1 progressive dizziness/imbalance with recurrent falls  #2 increasing tremor  Plan: Baseline labs will be checked. He does need to followup with his neurologist, Dr. [name].

## 2011-03-17 LAB — CBC WITH DIFFERENTIAL/PLATELET
Basophils Absolute: 0 10*3/uL (ref 0.0–0.1)
Eosinophils Absolute: 0.1 10*3/uL (ref 0.0–0.7)
HCT: 41.6 % (ref 39.0–52.0)
Hemoglobin: 13.9 g/dL (ref 13.0–17.0)
Lymphs Abs: 1.2 10*3/uL (ref 0.7–4.0)
MCHC: 33.3 g/dL (ref 30.0–36.0)
MCV: 93.5 fl (ref 78.0–100.0)
Monocytes Absolute: 0.5 10*3/uL (ref 0.1–1.0)
Neutro Abs: 4.7 10*3/uL (ref 1.4–7.7)
Platelets: 133 10*3/uL — ABNORMAL LOW (ref 150.0–400.0)
RDW: 13.3 % (ref 11.5–14.6)

## 2011-03-17 LAB — TSH: TSH: 0.93 u[IU]/mL (ref 0.35–5.50)

## 2011-03-17 LAB — BASIC METABOLIC PANEL
BUN: 31 mg/dL — ABNORMAL HIGH (ref 6–23)
CO2: 26 mEq/L (ref 19–32)
GFR: 55.89 mL/min — ABNORMAL LOW (ref >60.00)
Glucose, Bld: 132 mg/dL — ABNORMAL HIGH (ref 70–99)
Potassium: 4.8 mEq/L (ref 3.5–5.1)
Sodium: 140 mEq/L (ref 135–145)

## 2011-04-05 ENCOUNTER — Ambulatory Visit (HOSPITAL_COMMUNITY)
Admission: RE | Admit: 2011-04-05 | Discharge: 2011-04-05 | Disposition: A | Discharge status: Home / Self Care | Payer: Medicare Other | Source: Ambulatory Visit | Attending: Neurology | Admitting: Neurology

## 2011-04-05 DIAGNOSIS — R2689 Other abnormalities of gait and mobility: Secondary | ICD-10-CM | POA: Insufficient documentation

## 2011-04-05 DIAGNOSIS — R262 Difficulty in walking, not elsewhere classified: Secondary | ICD-10-CM | POA: Insufficient documentation

## 2011-04-05 DIAGNOSIS — E785 Hyperlipidemia, unspecified: Secondary | ICD-10-CM | POA: Insufficient documentation

## 2011-04-05 DIAGNOSIS — M6281 Muscle weakness (generalized): Secondary | ICD-10-CM | POA: Insufficient documentation

## 2011-04-05 DIAGNOSIS — IMO0001 Reserved for inherently not codable concepts without codable children: Secondary | ICD-10-CM | POA: Insufficient documentation

## 2011-04-05 DIAGNOSIS — R269 Unspecified abnormalities of gait and mobility: Secondary | ICD-10-CM | POA: Insufficient documentation

## 2011-04-05 DIAGNOSIS — M25659 Stiffness of unspecified hip, not elsewhere classified: Secondary | ICD-10-CM | POA: Insufficient documentation

## 2011-04-05 HISTORY — DX: Difficulty in walking, not elsewhere classified: R26.2

## 2011-04-05 HISTORY — DX: Other abnormalities of gait and mobility: R26.89

## 2011-04-05 NOTE — Evaluation (Signed)
Physical Therapy Evaluation  Patient Details  Name: Jose Leach MRN: [id number] Date of Birth: [birth year]  Today's Date: 04/05/2011 Time: 4098-1191 Time Calculation (min): 53 min Charges: 1 eval, 10 min TE  Visit#: 1  of 8   Re-eval: 05/05/11 Assessment Diagnosis: gait disorder/balance disorder Next MD Visit: 3 months  Past Medical History:  Past Medical History  Diagnosis Date  . Migraines     Dr [name]  . Peripheral neuropathy    Past Surgical History:  Past Surgical History  Procedure Date  . Appendectomy   . Lumbar laminectomy   . Carotid endarterectomy      L  . Tonsillectomy and adenoidectomy     Subjective Symptoms/Limitations Symptoms: Pt is a 78  year old male referred to PT secondary to gait disorder.  He reports that he has a history of gait disorders for about 2 years, which started mainly after his L hip replacement and increased B neuropathy. He was able to walk without the cane after his L hip replacement for short distances.  He typically ambulates with a cane in the community and reports that his doctor has recommended him to use a walker in the community.  His c/co is fear of falling and difficulty with balance with ambulating outside.  he reports he has fallen 3x in the past month.  1x in the bathroom, out on the street, coming in from the car.  He reports he has fallen about 6x in the past 3 months.  He has a cell phone and can call for help from his wife when he needs it.  Denies history of frequent dizziness, describes occasional dizziness, but does not feel it is related to his falls.  How long can you walk comfortably?: Able to walk 30 minutes, but has increased fear of falling. Stairs: Does not currently take the stairs for fear of falling, he reports that he could complete stairs if he had two handrails. Pain Assessment Currently in Pain?: No/denies  Sensation/Coordination/Flexibility/Functional Tests Functional Tests Functional Tests: 30 sec  sit to stand: able to complete 1x  Assessment RLE Strength RLE Overall Strength Comments: Significant trunk extension with seated isometric testing secondary to core weakness.  Right Hip Flexion: 3+/5 Right Hip Extension: 4/5 Right Hip ABduction: 3+/5 Right Hip ADduction: 2+/5 Right Knee Flexion: 3+/5 Right Knee Extension: 3/5 Right Ankle Dorsiflexion: 4/5 LLE Strength Left Hip Flexion: 3/5 Left Hip Extension: 4/5 Left Hip ABduction: 3+/5 Left Hip ADduction: 2/5 Left Knee Flexion: 3+/5 Left Knee Extension: 3/5 Left Ankle Dorsiflexion: 4/5  Exercise/Treatments Mobility/Balance  Ambulation/Gait Ambulation/Gait: Yes Assistive device: Straight cane Gait Pattern: Decreased stride length;Decreased weight shift to left;Trendelenburg;Decreased trunk rotation Posture/Postural Control Posture/Postural Control: Postural limitations Postural Limitations: Significnat slouched posture.  Berg Balance Test Sit to Stand: Able to stand  independently using hands Standing Unsupported: Able to stand safely 2 minutes Sitting with Back Unsupported but Feet Supported on Floor or Stool: Able to sit safely and securely 2 minutes Stand to Sit: Sits safely with minimal use of hands Transfers: Able to transfer safely, definite need of hands Standing Unsupported with Eyes Closed: Able to stand 10 seconds safely Standing Ubsupported with Feet Together: Able to place feet together independently and stand for 1 minute with supervision From Standing, Reach Forward with Outstretched Arm: Can reach confidently >25 cm (10") From Standing Position, Pick up Object from Floor: Able to pick up shoe, needs supervision From Standing Position, Turn to Look Behind Over each Shoulder: Looks behind from both  sides and weight shifts well Turn 360 Degrees: Able to turn 360 degrees safely but slowly Standing Unsupported, Alternately Place Feet on Step/Stool: Needs assistance to keep from falling or unable to try Standing  Unsupported, One Foot in Front: Loses balance while stepping or standing Standing on One Leg: Unable to try or needs assist to prevent fall Total Score: 38    Treatment: Sit to stand x5 Ab set with bent knee raise x5 each Bridging x5 Hip extension x5 BLE  Physical Therapy Assessment and Plan PT Assessment and Plan Clinical Impression Statement: Pt is a 78 year old male referred to PT secondary to gait abnormality and difficulty with balance.  After examinitaion it was found that he has currnet body structure impairemtns including decreased LE strength, gait abnormalities, difficulty with balance, decreased core strength and decreased independence with ambulation which are limiting his ability to participate in community and household related activities.  Pt will benefit from skilled PT in order to address above impaiments in order to maximize independence and funciton, in order to reach goals.  PT Frequency: Min 2X/week PT Duration: 6 weeks;4 weeks PT Treatment/Interventions: Gait training;DME instruction;Stair training;Functional mobility training;Therapeutic activities;Therapeutic exercise;Balance training;Neuromuscular re-education;Patient/family education    Goals  Home Exercise Program: Pt will Perform Home Exercise Program Independently PT Goal: Perform Home Exercise Program - Progress Goal set today PT Short Term Goals: Time to Complete Short Term Goals 2 weeks PT Short Term Goal 1 Pt will demonstrate 3 STS without UE support PT Short Term Goal 1 - Progress PT Short Term Goal 2 Pt will report 1 week without a fall. PT Short Term Goal 2 - Progress PT Short Term Goal 3 Pt will improve LE strength to 4/5 overall. PT Short Term Goal 3 - Progress PT Short Term Goal 4 Pt will ambulate in a closed environment independently x5 minutes. PT Short Term Goal 4 - Progress PT Short Term Goal 5 PT Short Term Goal 5 - Progress Additional PT Short Term Goals? PT Long Term Goals: Time to Complete Long Term  Goals 4 weeks PT Long Term Goal 1 Pt will improve his Berg Score to 45/56 for improved safety in the community. PT Long Term Goal 1 - Progress PT Long Term Goal 2 Pt will improve LE strength in order to tolerate ambulating in an open outdoor community environment for 30 minutes with LRAD without a LOB. PT Long Term Goal 2 - Progress Long Term Goal 3 Pt will improve LE power and demonstrate recipriocal pattern while ascending and descending 5 stairs with 1 handrail.   Problem List Patient Active Problem List  Diagnoses  . HERPES ZOSTER  . HYPOTHYROIDISM  . HYPERLIPIDEMIA  . ANXIETY  . MIGRAINE HEADACHE  . MORTON'S NEUROMA, LEFT  . PREMATURE ATRIAL CONTRACTIONS  . CAROTID ARTERY DISEASE  . PERIPHERAL VASCULAR DISEASE  . ESOPHAGITIS  . INGUINAL HERNIA, LEFT, SMALL  . ARTHRITIS  . SPINAL STENOSIS  . GAIT IMBALANCE  . ABNORMAL ELECTROCARDIOGRAM  . ARTHRALGIA  . MUSCLE PAIN  . HEADACHE  . PRESSURE ULCER OTHER SITE  . HAMMER TOE    PT - End of Session Activity Tolerance: Patient tolerated treatment well PT Plan of Care PT Home Exercise Plan: Complete DGI, TM walking for endurance and to improve posture. Add squats, heel/toe raises, LAQ's, tandem balance, balance with eyes closed.    Travin Marik 04/05/2011, 9:53 AM  Physician Documentation Your signature is required to indicate approval of the treatment plan as stated above.  Please sign and either send electronically or make a copy of this report for your files and return this physician signed original.   Please mark one 1.__approve of plan  2. ___approve of plan with the following conditions.   ______________________________                                                          _____________________ Physician Signature                                                                                                             Date

## 2011-04-12 ENCOUNTER — Ambulatory Visit (HOSPITAL_COMMUNITY): Payer: Medicare Other | Admitting: Physical Therapy

## 2011-04-16 ENCOUNTER — Ambulatory Visit (HOSPITAL_COMMUNITY)
Admission: RE | Admit: 2011-04-16 | Discharge: 2011-04-16 | Disposition: A | Discharge status: Home / Self Care | Payer: Medicare Other | Source: Ambulatory Visit | Attending: Internal Medicine | Admitting: Internal Medicine

## 2011-04-16 DIAGNOSIS — IMO0001 Reserved for inherently not codable concepts without codable children: Secondary | ICD-10-CM | POA: Insufficient documentation

## 2011-04-16 DIAGNOSIS — R269 Unspecified abnormalities of gait and mobility: Secondary | ICD-10-CM | POA: Insufficient documentation

## 2011-04-16 DIAGNOSIS — M25659 Stiffness of unspecified hip, not elsewhere classified: Secondary | ICD-10-CM | POA: Insufficient documentation

## 2011-04-16 DIAGNOSIS — M6281 Muscle weakness (generalized): Secondary | ICD-10-CM | POA: Insufficient documentation

## 2011-04-16 DIAGNOSIS — E785 Hyperlipidemia, unspecified: Secondary | ICD-10-CM | POA: Insufficient documentation

## 2011-04-16 NOTE — Progress Notes (Signed)
Physical Therapy Treatment Patient Details  Name: Jose Leach MRN: [id number] Date of Birth: [birth year]  Today's Date: 04/16/2011 Time: 4098-1191 Time Calculation (min): 43 min Visit#: 2  of 8   Re-eval: 05/05/11  Charge: NMR 38 min Gait 5 min  Subjective: Symptoms/Limitations Symptoms: Pt reported compliance with HEP.  No pain pt reported a fall two weeks ago with a bruise on L hip from hitting the wall.   Pain Assessment Currently in Pain?: No/denies  Objective:   Exercise/Treatments Standing Heel Raises: 10 reps;Limitations Heel Raises Limitations: toe raises 10 reps Functional Squat: 10 reps SLS: 2x 30" B with 1 HHA Gait Training: gait training for proper sequence with SPC x 5 min Other Standing Knee Exercises: Tandem Stance 2x30 sec w/mod A BLE (worse with RLE), feet side by side x1' side by side on foam x 1', retro gait 1 RT,  Other Standing Knee Exercises: 5 STS with no HHA, mod assistance Seated Other Seated Knee Exercises: Sit to stand x5 with moderate assistance no HHA Supine Bridges: 10 reps Other Supine Knee Exercises: Hid adduction 10x 3" holds supine Other Supine Knee Exercises: Ab set with bent knee raise x15 each     Physical Therapy Assessment and Plan PT Assessment and Plan Clinical Impression Statement: Reviewed HEP, pt able to complete all activities without difficutly but did require mod assistance with balance activities to reduce risk of falls. Mod assistance required with all new balance activities, pt with visual and vc-ing for proprioception with foot placement.   PT Plan: Next session complete DGI, TM walking for endurance and to improve posture.  Begin LAQs and balance with eyes closed.    Goals    Problem List Patient Active Problem List  Diagnoses  . HERPES ZOSTER  . HYPOTHYROIDISM  . HYPERLIPIDEMIA  . ANXIETY  . MIGRAINE HEADACHE  . MORTON'S NEUROMA, LEFT  . PREMATURE ATRIAL CONTRACTIONS  . CAROTID ARTERY DISEASE  .  PERIPHERAL VASCULAR DISEASE  . ESOPHAGITIS  . INGUINAL HERNIA, LEFT, SMALL  . ARTHRITIS  . SPINAL STENOSIS  . GAIT IMBALANCE  . ABNORMAL ELECTROCARDIOGRAM  . ARTHRALGIA  . MUSCLE PAIN  . HEADACHE  . PRESSURE ULCER OTHER SITE  . HAMMER TOE  . Difficulty in walking  . Balance disorder    PT - End of Session Activity Tolerance: Patient tolerated treatment well General Behavior During Session: [facility] for tasks performed Cognition: [facility] for tasks performed  Juel Burrow, PTA 04/16/2011, 12:12 PM

## 2011-04-19 ENCOUNTER — Ambulatory Visit (HOSPITAL_COMMUNITY)
Admission: RE | Admit: 2011-04-19 | Discharge: 2011-04-19 | Disposition: A | Discharge status: Home / Self Care | Payer: Medicare Other | Source: Ambulatory Visit | Attending: Internal Medicine | Admitting: Internal Medicine

## 2011-04-19 NOTE — Progress Notes (Addendum)
Physical Therapy Treatment Patient Details  Name: Jose Leach MRN: [id number] Date of Birth: Nov 27, 1932  Today's Date: 04/19/2011 Time: 4098-1191 Time Calculation (min): 50 min Visit#: 3  of 8   Re-eval: 05/05/11 Charges: Therex x 10' NMR x 24' Gait x 10'   Subjective: Symptoms/Limitations Symptoms: Pt reports no falls latelty. Pain Assessment Currently in Pain?: No/denies   Exercise/Treatments Standing Heel Raises: 10 reps;Limitations Heel Raises Limitations: toe raises 10 reps Functional Squat: 10 reps SLS: 2x 30" B with 1 finger assistance Gait Training: gait training for proper sequence with [facility] 1x4' 1x6' Other Standing Knee Exercises: Tandem Stance 1' B with HHA assist PRN, feet side by side x1' side by side on foam x 1', retro gait 1 RT,  Other Standing Knee Exercises: 5 STS with no HHA, min assistance Seated Long Arc Quad: 10 reps Other Seated Knee Exercises: Sit to stand x5 with moderate assistance no HHA  Physical Therapy Assessment and Plan PT Assessment and Plan Clinical Impression Statement: Pt displays imporvoed stabiity with tandem and SLS. Pt requires CGA with gait training with SPC and VC's to improve sequenceing, dorsifleixon and knee flexion. Pt is without complaint throughout session. PT Plan: Continue with PT POC. Begin DGI and balance with eyes closed next session.    Problem List Patient Active Problem List  Diagnoses  . HERPES ZOSTER  . HYPOTHYROIDISM  . HYPERLIPIDEMIA  . ANXIETY  . MIGRAINE HEADACHE  . MORTON'S NEUROMA, LEFT  . PREMATURE ATRIAL CONTRACTIONS  . CAROTID ARTERY DISEASE  . PERIPHERAL VASCULAR DISEASE  . ESOPHAGITIS  . INGUINAL HERNIA, LEFT, SMALL  . ARTHRITIS  . SPINAL STENOSIS  . GAIT IMBALANCE  . ABNORMAL ELECTROCARDIOGRAM  . ARTHRALGIA  . MUSCLE PAIN  . HEADACHE  . PRESSURE ULCER OTHER SITE  . HAMMER TOE  . Difficulty in walking  . Balance disorder    PT - End of Session Equipment Utilized During Treatment:  Gait belt [facility]) Activity Tolerance: Patient tolerated treatment well General Behavior During Session: [facility] for tasks performed Cognition: [facility] for tasks performed  Seth Bake, PTA 04/19/2011, 10:25 AM

## 2011-04-23 ENCOUNTER — Ambulatory Visit (HOSPITAL_COMMUNITY)
Admission: RE | Admit: 2011-04-23 | Discharge: 2011-04-23 | Disposition: A | Discharge status: Home / Self Care | Payer: Medicare Other | Source: Ambulatory Visit | Attending: Internal Medicine | Admitting: Internal Medicine

## 2011-04-23 NOTE — Progress Notes (Signed)
Physical Therapy Treatment Patient Details  Name: Jose Leach MRN: [id number] Date of Birth: 1932-04-09  Today's Date: 04/23/2011 Time: 1478-2956 Time Calculation (min): 45 min Visit#: 4  of 8   Re-eval: 05/05/11  Charge: gait 18 min NMR 27 min  Subjective: Symptoms/Limitations Symptoms: Pt reports no current falls, no pain. Pain Assessment Currently in Pain?: No/denies  Objective:   Exercise/Treatments  Dynamic Gait Index Level Surface: Mild Impairment Change in Gait Speed: Mild Impairment Gait with Horizontal Head Turns: Mild Impairment Gait with Vertical Head Turns: Mild Impairment Gait and Pivot Turn: Mild Impairment Step Over Obstacle: Mild Impairment Step Around Obstacles: Mild Impairment Steps: Mild Impairment Total Score: 16   Standing Gait Training: DGI x 10 min, gait trainer over TM x 8 min Other Standing Knee Exercises: Tandem stance 2x 30" B with intermittent HHA; feet side by side 2x 30", feet side by side with eyes closed 3 sets max 18" before LOB, tandem gait 2 RT, retro gait 1 RT Other Standing Knee Exercises: 5 STS with no HHA, min assistance Seated Long Arc Quad: 10 reps;Weights Long Arc Quad Weight: 3 lbs.  Physical Therapy Assessment and Plan PT Assessment and Plan Clinical Impression Statement: Complete DIG ambulating with SPC, pt with mild gait impairments with change of cadence, horizontal and vertical head turns, pivot turns, stepping over and around objects and stairs able to complete reciprocally but did requiie handrail assistance escpecially descending.  Pt lacking full extension L knee, this impairment alters proper gait mechanics, recommend complete PROM next session. PT Plan: Continue with PT POC, progress balance, complete PROM for knee extension to improve gait mechanics.    Goals    Problem List Patient Active Problem List  Diagnoses  . HERPES ZOSTER  . HYPOTHYROIDISM  . HYPERLIPIDEMIA  . ANXIETY  . MIGRAINE HEADACHE  .  MORTON'S NEUROMA, LEFT  . PREMATURE ATRIAL CONTRACTIONS  . CAROTID ARTERY DISEASE  . PERIPHERAL VASCULAR DISEASE  . ESOPHAGITIS  . INGUINAL HERNIA, LEFT, SMALL  . ARTHRITIS  . SPINAL STENOSIS  . GAIT IMBALANCE  . ABNORMAL ELECTROCARDIOGRAM  . ARTHRALGIA  . MUSCLE PAIN  . HEADACHE  . PRESSURE ULCER OTHER SITE  . HAMMER TOE  . Difficulty in walking  . Balance disorder    PT - End of Session Equipment Utilized During Treatment: Gait belt Activity Tolerance: Patient tolerated treatment well General Behavior During Session: [facility] for tasks performed Cognition: [facility] for tasks performed  Juel Burrow, PTA 04/23/2011, 10:50 AM

## 2011-04-26 ENCOUNTER — Telehealth (HOSPITAL_COMMUNITY): Payer: Self-pay

## 2011-04-26 ENCOUNTER — Ambulatory Visit (HOSPITAL_COMMUNITY): Payer: Medicare Other | Admitting: Physical Therapy

## 2011-04-27 DIAGNOSIS — G250 Essential tremor: Secondary | ICD-10-CM | POA: Insufficient documentation

## 2011-04-27 DIAGNOSIS — H35379 Puckering of macula, unspecified eye: Secondary | ICD-10-CM

## 2011-04-27 HISTORY — DX: Puckering of macula, unspecified eye: H35.379

## 2011-04-30 ENCOUNTER — Ambulatory Visit (HOSPITAL_COMMUNITY)
Admission: RE | Admit: 2011-04-30 | Discharge: 2011-04-30 | Disposition: A | Discharge status: Home / Self Care | Payer: Medicare Other | Source: Ambulatory Visit

## 2011-04-30 NOTE — Progress Notes (Signed)
Physical Therapy Treatment Patient Details  Name: Jose Leach MRN: [id number] Date of Birth: 07/17/1932  Today's Date: 04/30/2011 Time: 4098-1191 Time Calculation (min): 45 min Visit#: 5  of 8   Re-eval: 05/05/11  Charge: gait 8 min therex 14 min NMR 23 min  Subjective: Symptoms/Limitations Symptoms: No pain, reported compliance with HEP. Pain Assessment Currently in Pain?: No/denies  Objective:   Exercise/Treatments Aerobic Tread Mill: gait trainer x 8 min @ .60 cyc/sec with vc for heel to toe and to fully extend B knees Standing Other Standing Knee Exercises: 5 STS with no HHA, min assistance Supine Quad Sets: 10 reps;Limitations Quad Sets Limitations: 5" holds Heel Slides: 10 reps;Limitations;Both Heel Slides Limitations: B prior PROM for extension for  Hip Adduction Isometric: 15 reps;Limitations Hip Adduction Isometric Limitations: with orange ball Bridges: 15 reps Knee Extension: PROM;2 sets  Balance Exercises Tread [place]: gait trainer x 8 min @ .60 cyc/sec with vc for heel to toe and to fully extend B knees Tandem Walking: 2 round trips Retro Gait: 1 round trip Tandem Stance: Right;Left;Limitations Tandem Stance Limitations: 4x 30" each; first 2 sets without visual cues required mod assistance with balance, 2nd 2 sets with mirrow with less assistance required.  Physical Therapy Assessment and Plan PT Assessment and Plan Clinical Impression Statement: Pt with tendency to lean L with tandem stance reqired mod assistance with multiple LOB episodes, pt stated he felt as though he was leaning to the right, less assistance required once incorporated visual cueing with mirrow.  Began supine PROM for extension to improve gait mechanics, able to fully extend knee.  Pt educated on TKE with heel strike with ambulation, better gait mechanics noted end of session. PT Plan: Continue with PT POC, begin standing TKE with blue tband, progress balance.  Reassess next week.     Goals    Problem List Patient Active Problem List  Diagnoses  . HERPES ZOSTER  . HYPOTHYROIDISM  . HYPERLIPIDEMIA  . ANXIETY  . MIGRAINE HEADACHE  . MORTON'S NEUROMA, LEFT  . PREMATURE ATRIAL CONTRACTIONS  . CAROTID ARTERY DISEASE  . PERIPHERAL VASCULAR DISEASE  . ESOPHAGITIS  . INGUINAL HERNIA, LEFT, SMALL  . ARTHRITIS  . SPINAL STENOSIS  . GAIT IMBALANCE  . ABNORMAL ELECTROCARDIOGRAM  . ARTHRALGIA  . MUSCLE PAIN  . HEADACHE  . PRESSURE ULCER OTHER SITE  . HAMMER TOE  . Difficulty in walking  . Balance disorder    PT - End of Session Equipment Utilized During Treatment: Gait belt Activity Tolerance: Patient tolerated treatment well General Behavior During Session: [facility] for tasks performed Cognition: [facility] for tasks performed  Juel Burrow, PTA 04/30/2011, 12:04 PM

## 2011-05-03 ENCOUNTER — Ambulatory Visit (HOSPITAL_COMMUNITY)
Admission: RE | Admit: 2011-05-03 | Discharge: 2011-05-03 | Disposition: A | Discharge status: Home / Self Care | Payer: Medicare Other | Source: Ambulatory Visit | Attending: Internal Medicine | Admitting: Internal Medicine

## 2011-05-03 NOTE — Progress Notes (Signed)
Physical Therapy Treatment Patient Details  Name: Jose Leach MRN: [id number] Date of Birth: December 14, 1932  Today's Date: 05/03/2011 Time: 4098-1191 Time Calculation (min): 46 min Visit#: 6  of 8   Re-eval: 05/05/11 Charges: gait x 8' NMR x 25'  Subjective: Symptoms/Limitations Symptoms: Pt states that he had some dizziness this morning whcich has now subsided. Pain Assessment Currently in Pain?: No/denies   Exercise/Treatments Aerobic Tread Mill: gait trainer x 8 min @ .60 cyc/sec with vc for heel to toe and to fully extend B knees Standing Other Standing Knee Exercises: Tandem stance 1' B with intermittent HHA; feet side by side x1', feet side by side with eyes closed 3 sets max 20" before LOB Other Standing Knee Exercises: 5 STS with no HHA, min assistance  Physical Therapy Assessment and Plan PT Assessment and Plan Clinical Impression Statement: Pt comes in today complaining of slight dizziness earlier in the morning. Pt stated that the dizziness had subsided. Dizziness returned while completing NMR exercises. B/P was taken sitting (100/60) and standing 90/56. Pt and pt's wife advised to contact MD today and let him know about pt's B/P. PT Plan: Continue to progress per PT POC.     Problem List Patient Active Problem List  Diagnoses  . HERPES ZOSTER  . HYPOTHYROIDISM  . HYPERLIPIDEMIA  . ANXIETY  . MIGRAINE HEADACHE  . MORTON'S NEUROMA, LEFT  . PREMATURE ATRIAL CONTRACTIONS  . CAROTID ARTERY DISEASE  . PERIPHERAL VASCULAR DISEASE  . ESOPHAGITIS  . INGUINAL HERNIA, LEFT, SMALL  . ARTHRITIS  . SPINAL STENOSIS  . GAIT IMBALANCE  . ABNORMAL ELECTROCARDIOGRAM  . ARTHRALGIA  . MUSCLE PAIN  . HEADACHE  . PRESSURE ULCER OTHER SITE  . HAMMER TOE  . Difficulty in walking  . Balance disorder    PT - End of Session Activity Tolerance: Patient tolerated treatment well General Behavior During Session: Jose Leach for tasks performed Cognition: Jose Leach for tasks  performed   Jose Leach, PTA 05/03/2011, 10:27 AM

## 2011-05-07 ENCOUNTER — Ambulatory Visit (HOSPITAL_COMMUNITY)
Admission: RE | Admit: 2011-05-07 | Discharge: 2011-05-07 | Disposition: A | Discharge status: Home / Self Care | Payer: Medicare Other | Source: Ambulatory Visit | Attending: Physical Therapy | Admitting: Physical Therapy

## 2011-05-07 NOTE — Evaluation (Signed)
Physical Therapy Re-Evaluation  Patient Details  Name: Jose Leach MRN: [id number] Date of Birth: 1933/02/01  Today's Date: 05/07/2011 Time: 4098-1191 Time Calculation (min): 46 min Charges: 20' TE, 15' PPT, 10' Self Care Visit#: 7  of 15   Re-eval: 06/06/11    Past Medical History:  Past Medical History  Diagnosis Date  . Migraines     Dr [name]  . Peripheral neuropathy    Past Surgical History:  Past Surgical History  Procedure Date  . Appendectomy   . Lumbar laminectomy   . Carotid endarterectomy      L  . Tonsillectomy and adenoidectomy     Subjective Symptoms/Limitations Symptoms: I still feel like my feet get mixed up.  I am not falling as much and I feel stronger.  How long can you walk comfortably?: Able to ascend and descend 10 stairs w/1 handrail w/supervision; is walking short distances outside, but still reports some fear. (04/05/11: fear of falling down stairs without 2 handrails) Pain Assessment Currently in Pain?: No/denies  Cognition/Observation Observation/Other Assessments Observations: Decreased hearing to his L ear with Neuro exam (however pt did not have his hearing aides in today.  Decrased weight shift and awareness to LLE when stepping into tandem.  Decreased motor control to left gluteus medius muscle.    Assessment RLE Strength Right Hip Flexion: 4/5 Right Hip Extension: 4/5 Right Hip ABduction: 4/5 Right Hip ADduction: 5/5 Right Knee Flexion: 5/5 Right Knee Extension: 5/5 LLE Strength Left Hip Flexion: 4/5 Left Hip Extension: 4/5 Left Hip ABduction: 3+/5 Left Hip ADduction: 5/5 Left Knee Flexion: 5/5 Left Knee Extension: 5/5 Mobility/Balance  Ambulation/Gait Ambulation/Gait: Yes Assistive device: Straight cane Gait Pattern: Right hip hike;Left flexed knee in stance;Decreased stride length Berg Balance Test Sit to Stand: Able to stand without using hands and stabilize independently Standing Unsupported: Able to stand safely  2 minutes Sitting with Back Unsupported but Feet Supported on Floor or Stool: Able to sit safely and securely 2 minutes Stand to Sit: Sits safely with minimal use of hands Transfers: Able to transfer safely, minor use of hands Standing Unsupported with Eyes Closed: Able to stand 10 seconds safely Standing Ubsupported with Feet Together: Able to place feet together independently and stand 1 minute safely From Standing, Reach Forward with Outstretched Arm: Can reach confidently >25 cm (10") From Standing Position, Pick up Object from Floor: Able to pick up shoe safely and easily From Standing Position, Turn to Look Behind Over each Shoulder: Looks behind from both sides and weight shifts well Turn 360 Degrees: Able to turn 360 degrees safely but slowly Standing Unsupported, Alternately Place Feet on Step/Stool: Able to stand independently and safely and complete 8 steps in 20 seconds Standing Unsupported, One Foot in Front: Able to plae foot ahead of the other independently and hold 30 seconds Standing on One Leg: Able to lift leg independently and hold equal to or more than 3 seconds Total Score: 51    Exercise/Treatments  Gait Training: Independent after balance NMR.  Had improved stride length and improved awareness of weight shiffting.  Other Standing Knee Exercises: Stepping forward and 30 sec holds w/NMR to improve weight distrubution to BLE 5x each LE.  Other Standing Knee Exercises: STS w/o UE support x3 Sidelying Clams: NMR to activate muscle.  Able to achieve 2x10 sec holds after NMR.   Physical Therapy Assessment and Plan PT Assessment and Plan Clinical Impression Statement: Mr. Schoeppner has attended 7 OP PT visits. In this time he  has met 4/5 STG and 2/3 original LTG.  Updated w/2 new LTG today.  He has made significant improvement in his overall LE functional strength. and balace.  He is ambulating in his house without his canr, but is still limited in community ambulation  (secondary to increased fear).  He continues to demonstrate abnormal gait, which is likely due to his LLE being longer than his RLE and he has significant weakness and decrased motor control to his left glutues medius.  He will continue to benefit from skilled OP PT in order to address remianing impairments in order to reach functional goals.   Rehab Potential: Good PT Frequency: Min 2X/week PT Duration: 4 weeks PT Treatment/Interventions: Gait training;Stair training;Functional mobility training;Therapeutic activities;Therapeutic exercise;Balance training;Neuromuscular re-education;Patient/family education PT Plan: Cont to focus on his awareness in space.  may need to take to a quite room to focus.    Goals Home Exercise Program Pt will Perform Home Exercise Program: Independently PT Goal: Perform Home Exercise Program - Progress: Met PT Short Term Goals Time to Complete Short Term Goals: 2 weeks PT Short Term Goal 1: Pt will demonstrate 3 STS without UE support PT Short Term Goal 1 - Progress: Met PT Short Term Goal 2: Pt will report 1 week without a fall. PT Short Term Goal 2 - Progress: Met PT Short Term Goal 3: Pt will improve LE strength to 4/5 overall. PT Short Term Goal 3 - Progress: Progressing toward goal PT Short Term Goal 4: Pt will ambulate in a closed environment independently x5 minutes.  PT Short Term Goal 4 - Progress: Met PT Long Term Goals Time to Complete Long Term Goals: 4 weeks PT Long Term Goal 1: Pt will improve his Berg Score to 45/56 for improved safety in the community.  PT Long Term Goal 1 - Progress: Met PT Long Term Goal 2: Pt will improve LE strength in order to tolerate ambulating in an open outdoor community environment for 30 minutes with LRAD without a LOB.  PT Long Term Goal 2 - Progress: Not met Long Term Goal 3: Pt will improve LE power and demonstrate recipriocal pattern while ascending and descending 5 stairs with 1 handrail. Long Term Goal 3  Progress: Met Long Term Goal 4: New Goal 05/07/11: Pt will improve gluteus medius strength and endruance and demonstrate S/L clams 10x10 sec holds for improved body awareness PT Long Term Goal 5: New Goal 05/07/11: Pt will demonstrate tandem standing with appropriate shifting to BLE.   Problem List Patient Active Problem List  Diagnoses  . HERPES ZOSTER  . HYPOTHYROIDISM  . HYPERLIPIDEMIA  . ANXIETY  . MIGRAINE HEADACHE  . MORTON'S NEUROMA, LEFT  . PREMATURE ATRIAL CONTRACTIONS  . CAROTID ARTERY DISEASE  . PERIPHERAL VASCULAR DISEASE  . ESOPHAGITIS  . INGUINAL HERNIA, LEFT, SMALL  . ARTHRITIS  . SPINAL STENOSIS  . GAIT IMBALANCE  . ABNORMAL ELECTROCARDIOGRAM  . ARTHRALGIA  . MUSCLE PAIN  . HEADACHE  . PRESSURE ULCER OTHER SITE  . HAMMER TOE  . Difficulty in walking  . Balance disorder    PT - End of Session Activity Tolerance: Patient tolerated treatment well Sosha Shepherd 05/07/2011, 12:20 PM  Physician Documentation Your signature is required to indicate approval of the treatment plan as stated above.  Please sign and either send electronically or make a copy of this report for your files and return this physician signed original.   Please mark one 1.__approve of plan  2. ___approve of plan with  the following conditions.   ______________________________                                                          _____________________ Physician Signature                                                                                                             Date

## 2011-05-12 ENCOUNTER — Ambulatory Visit (HOSPITAL_COMMUNITY)
Admission: RE | Admit: 2011-05-12 | Discharge: 2011-05-12 | Disposition: A | Discharge status: Home / Self Care | Payer: Medicare Other | Source: Ambulatory Visit | Attending: Internal Medicine | Admitting: Internal Medicine

## 2011-05-12 NOTE — Progress Notes (Addendum)
Physical Therapy Treatment Patient Details  Name: Jose Leach MRN: [id number] Date of Birth: 1932-12-14  Today's Date: 05/12/2011 Time: 4782-9562 Time Calculation (min): 43 min Visit#: 8  of 15   Re-eval: 06/06/11  Charge: NMR 43 min  Subjective: Symptoms/Limitations Symptoms: I feel like my balance is improving, no pain today. Pain Assessment Currently in Pain?: No/denies  Objective:   Exercise/Treatments Standing Other Standing Knee Exercises: 3 sets/ Stepping forward and 30 sec holds w/NMR to improve weight distrubution to BLE 5x each LE. 3 sets/5 reps B tandem stance with eyes shut 10" holds Other Standing Knee Exercises: 2 sets/ 5 STS w/o UE support     Physical Therapy Assessment and Plan PT Assessment and Plan Clinical Impression Statement: Session focus on static balance and pt educated on techniques to find COG independently, pt able to increase distance between feet with no assistance required for last set B LE forward.  Pt able to hold stance with eyes shut times 10" at end of session. PT Plan: Continue to focus on his awareness in space in quite room    Goals    Problem List Patient Active Problem List  Diagnoses  . HERPES ZOSTER  . HYPOTHYROIDISM  . HYPERLIPIDEMIA  . ANXIETY  . MIGRAINE HEADACHE  . MORTON'S NEUROMA, LEFT  . PREMATURE ATRIAL CONTRACTIONS  . CAROTID ARTERY DISEASE  . PERIPHERAL VASCULAR DISEASE  . ESOPHAGITIS  . INGUINAL HERNIA, LEFT, SMALL  . ARTHRITIS  . SPINAL STENOSIS  . GAIT IMBALANCE  . ABNORMAL ELECTROCARDIOGRAM  . ARTHRALGIA  . MUSCLE PAIN  . HEADACHE  . PRESSURE ULCER OTHER SITE  . HAMMER TOE  . Difficulty in walking  . Balance disorder    PT - End of Session Activity Tolerance: Patient tolerated treatment well General Behavior During Session: [facility] for tasks performed Cognition: [facility] for tasks performed  GP No functional reporting required  Juel Burrow, PTA 05/12/2011, 3:02 PM

## 2011-05-18 ENCOUNTER — Ambulatory Visit (HOSPITAL_COMMUNITY)
Admission: RE | Admit: 2011-05-18 | Discharge: 2011-05-18 | Disposition: A | Discharge status: Home / Self Care | Payer: Medicare Other | Source: Ambulatory Visit | Attending: Neurology | Admitting: Neurology

## 2011-05-18 DIAGNOSIS — M6281 Muscle weakness (generalized): Secondary | ICD-10-CM | POA: Insufficient documentation

## 2011-05-18 DIAGNOSIS — R269 Unspecified abnormalities of gait and mobility: Secondary | ICD-10-CM | POA: Insufficient documentation

## 2011-05-18 DIAGNOSIS — IMO0001 Reserved for inherently not codable concepts without codable children: Secondary | ICD-10-CM | POA: Insufficient documentation

## 2011-05-18 DIAGNOSIS — E785 Hyperlipidemia, unspecified: Secondary | ICD-10-CM | POA: Insufficient documentation

## 2011-05-18 DIAGNOSIS — M25659 Stiffness of unspecified hip, not elsewhere classified: Secondary | ICD-10-CM | POA: Insufficient documentation

## 2011-05-18 NOTE — Progress Notes (Signed)
Physical Therapy Treatment Patient Details  Name: Jose Leach MRN: [id number] Date of Birth: [birth year]  Today's Date: 05/18/2011 Time: 4098-1191 Time Calculation (min): 40 min Visit#: 9  of 15   Re-eval: 06/06/11 Charges: Neuro re-ed x 23' Therex x 15'  Subjective: Symptoms/Limitations Symptoms: Pt describes balance as fair. Pt states he has not had any falls since last session. Pain Assessment Currently in Pain?: No/denies  Therex instruction completed by Renita Papa under supervision of Meredith Pel  Exercise/Treatments Standing Stairs: 4" box toe tap x 5 B Other Standing Knee Exercises: 2x1' half tandem stance B; feet side my side with eyes closed 2x1'   Other Standing Knee Exercises: 10 STS w/o UE support rom 19" surface Supine Bridges: 10 reps;Limitations Bridges Limitations: 5" holds Other Supine Knee Exercises: B Isometric ab/add x 10 each  Physical Therapy Assessment and Plan PT Assessment and Plan Clinical Impression Statement: Began bridging exercises again this session secondary to noted hip weakness with balance activities. Quite a bit of instability noted with bridges. Also began isometrc ab/add to increase strength. Weakness greater in B hip abductors compared to adductors. PT Plan: Continue to progress per PT POC.    Problem List Patient Active Problem List  Diagnoses  . HERPES ZOSTER  . HYPOTHYROIDISM  . HYPERLIPIDEMIA  . ANXIETY  . MIGRAINE HEADACHE  . MORTON'S NEUROMA, LEFT  . PREMATURE ATRIAL CONTRACTIONS  . CAROTID ARTERY DISEASE  . PERIPHERAL VASCULAR DISEASE  . ESOPHAGITIS  . INGUINAL HERNIA, LEFT, SMALL  . ARTHRITIS  . SPINAL STENOSIS  . GAIT IMBALANCE  . ABNORMAL ELECTROCARDIOGRAM  . ARTHRALGIA  . MUSCLE PAIN  . HEADACHE  . PRESSURE ULCER OTHER SITE  . HAMMER TOE  . Difficulty in walking  . Balance disorder    PT - End of Session Activity Tolerance: Patient tolerated treatment well General Behavior During  Session: [facility] for tasks performed Cognition: [facility] for tasks performed  Seth Bake, PTA 05/18/2011, 1:47 PM

## 2011-05-21 ENCOUNTER — Ambulatory Visit (HOSPITAL_COMMUNITY)
Admission: RE | Admit: 2011-05-21 | Discharge: 2011-05-21 | Disposition: A | Discharge status: Home / Self Care | Payer: Medicare Other | Source: Ambulatory Visit

## 2011-05-21 NOTE — Progress Notes (Addendum)
Physical Therapy Treatment Patient Details  Name: Jose Leach MRN: [id number] Date of Birth: 1932-12-03  Today's Date: 05/21/2011 Time: 7829-5621 Time Calculation (min): 40 min Visit#: 10  of 15   Re-eval: 06/06/11  Charge: NMR 23 min therex 17 min  Subjective: Symptoms/Limitations Symptoms: Pt stated he feels his balance is improving, no falls since started PT.  Pt with increase anxiety at entrance of session, stated he had drove to session independently for the first time today then rushed in for apt. Pain Assessment Currently in Pain?: No/denies  Objective:   Exercise/Treatments Standing Other Standing Knee Exercises: 3x 30" increasing tandem stance each time, 2x feet side by side with eyes shut- 20" max before LOB episodes Other Standing Knee Exercises: 10 STS w/o UE support  Supine Bridges: 10 reps;Limitations Bridges Limitations: 5" holds Other Supine Knee Exercises: isometric adduction 10 x 5"; abduction with blue tband 10 reps Sidelying Clams: NMR for proper musculare activation 5x 10"    Physical Therapy Assessment and Plan PT Assessment and Plan Clinical Impression Statement: Pt with increase anxiety initially at session, pt required mod assistance for LOB episodes with feet side by side and eyes closed, pt able to hold position max of 20" before having to step outside BOS to regain balance.  Resumed NMR to B glut med with better balance and gait mechanics noted following.  Pt able to  hold 30" with eyes shut following NMR to glut med.   PT Plan: Continue progressing current POC.    Goals    Problem List Patient Active Problem List  Diagnoses  . HERPES ZOSTER  . HYPOTHYROIDISM  . HYPERLIPIDEMIA  . ANXIETY  . MIGRAINE HEADACHE  . MORTON'S NEUROMA, LEFT  . PREMATURE ATRIAL CONTRACTIONS  . CAROTID ARTERY DISEASE  . PERIPHERAL VASCULAR DISEASE  . ESOPHAGITIS  . INGUINAL HERNIA, LEFT, SMALL  . ARTHRITIS  . SPINAL STENOSIS  . GAIT IMBALANCE  .  ABNORMAL ELECTROCARDIOGRAM  . ARTHRALGIA  . MUSCLE PAIN  . HEADACHE  . PRESSURE ULCER OTHER SITE  . HAMMER TOE  . Difficulty in walking  . Balance disorder    PT - End of Session Activity Tolerance: Patient tolerated treatment well General Behavior During Session: [facility] for tasks performed Cognition: [facility] for tasks performed  GP No functional reporting required  Juel Burrow, PTA 05/21/2011, 2:10 PM

## 2011-05-25 ENCOUNTER — Ambulatory Visit (HOSPITAL_COMMUNITY)
Admission: RE | Admit: 2011-05-25 | Discharge: 2011-05-25 | Disposition: A | Discharge status: Home / Self Care | Payer: Medicare Other | Source: Ambulatory Visit | Attending: Neurology | Admitting: Neurology

## 2011-05-25 NOTE — Progress Notes (Signed)
Physical Therapy Treatment Patient Details  Name: TRYGVE THAL MRN: [id number] Date of Birth: [birth year]  Today's Date: 05/25/2011 Time: 4098-1191 Time Calculation (min): 38 min Charges: 25' NMR, 13 Te Visit#: 11  of 15   Re-eval: 06/06/11    Subjective: Symptoms/Limitations Symptoms: I think my balance is gettting better.  he reports he is doing these exercises at home. I still feel like I am leaning forward when I walk.  Pain Assessment Currently in Pain?: No/denies  Exercise/Treatments  Machines for Strengthening Cybex Knee Extension: 2 PL x10 Cybex Knee Flexion: 4 PL x10 Standing Stairs: 4 in box 1 foot on, 1 off 3x1 minute each Gait Training: Heel walking 2 RT Other Standing Knee Exercises: Double limb stance w/feet together eyes open 4x1 minute.  After each session followed by eyes closed: 10 sec, 13 sec, 9 sec, 45 sec (verbal cueing with eyes closed yielded best results) Other Standing Knee Exercises: Staggard stance eyes open x30 sec each, eyes closed x30 sec each w/supervision Sidelying Clams: NMR to establish muscular control using VC's TC's AAROM. After NMR able to complete 2x10 sec holds  Physical Therapy Assessment and Plan PT Assessment and Plan Clinical Impression Statement: Pt continues to demonstrate improvement with his static standing balance with eyes open and closed.  He was able to maintain greatest hold when given verbal cueing for body awareness.  He continues to struggle with motor control to L gluteus medius which may be a primary cause of his overall impaired balance. Educated patient on role of glutues medius after THR and recommended insert to try at therapy to help decrease leg length discrepency.  PT Plan: F/U with shoe lift. Cont to address functional strength and LE motor control and balance.     Goals    Problem List Patient Active Problem List  Diagnoses  . HERPES ZOSTER  . HYPOTHYROIDISM  . HYPERLIPIDEMIA  . ANXIETY  . MIGRAINE  HEADACHE  . MORTON'S NEUROMA, LEFT  . PREMATURE ATRIAL CONTRACTIONS  . CAROTID ARTERY DISEASE  . PERIPHERAL VASCULAR DISEASE  . ESOPHAGITIS  . INGUINAL HERNIA, LEFT, SMALL  . ARTHRITIS  . SPINAL STENOSIS  . GAIT IMBALANCE  . ABNORMAL ELECTROCARDIOGRAM  . ARTHRALGIA  . MUSCLE PAIN  . HEADACHE  . PRESSURE ULCER OTHER SITE  . HAMMER TOE  . Difficulty in walking  . Balance disorder       GP Current Status  [phone] - Mobility: Walking & Moving Around CJ - At least 20% but less than 40% impaired, limited or restricted   Goal Status  [id number] - Mobility: [place] & Moving Around CI - At least 1% but less than 20% impaired, limited or restricted    Dylynn Ketner 05/25/2011, 12:17 PM

## 2011-05-27 ENCOUNTER — Ambulatory Visit (HOSPITAL_COMMUNITY)
Admission: RE | Admit: 2011-05-27 | Discharge: 2011-05-27 | Disposition: A | Discharge status: Home / Self Care | Payer: Medicare Other | Source: Ambulatory Visit | Attending: Neurology | Admitting: Neurology

## 2011-05-27 NOTE — Progress Notes (Addendum)
Physical Therapy Treatment Patient Details  Name: Jose Leach MRN: [id number] Date of Birth: December 24, 1932  Today's Date: 05/27/2011 Time: 4098-1191 Time Calculation (min): 43 min Visit#: 12  of 15   Re-eval: 06/04/11 Charges:  NMR 24', therex 15'    Subjective: Symptoms/Limitations Symptoms: Pt. states he does not have any pain; states he feels a little stronger but knows he has a long ways to go.  Exercises Instructed by Trilby Leaver, SPTA under the direction of Aeryn Medici Bascom Levels, PTA/CI. Exercise/Treatments Machines for Strengthening Cybex Knee Extension: 2 PL 2x10 Cybex Knee Flexion: 4 PL 2x10 Standing Stairs: 4 in box 1 foot on, 1 off 3x1 minute each Gait Training: Heel walking 2 RT Other Standing Knee Exercises: Double limb stance w/feet together eyes open 4x1 minute.  After each session followed by eyes closed: 30 sec, 30 sec, 25 sec, 40 sec (verbal cueing with eyes closed yielded best results) Other Standing Knee Exercises: Staggard stance eyes open x5" R lead and had to stop due to LOB Supine Straight Leg Raises: 10 reps;Both Sidelying Clams: 5 X 10" holds bilaterally   Physical Therapy Assessment and Plan PT Assessment and Plan Clinical Impression Statement: Pt. able to maintain standing balance with eyes closed for longer periods today.  He had difficulty maintaining tandem stance.  Overall had 4 LOB that required assistance to correct. PT Plan: Continue to progress balance and confidence with gait  Measure for Leg length difference next visit.   PT - End of Session Equipment Utilized During Treatment: Gait belt Activity Tolerance: Patient tolerated treatment well General Behavior During Session: [facility] for tasks performed Cognition: [facility] for tasks performed    Safiyah Cisney B. Bascom Levels, PTA 05/27/2011, 2:28 PM

## 2011-06-01 ENCOUNTER — Ambulatory Visit (HOSPITAL_COMMUNITY)
Admission: RE | Admit: 2011-06-01 | Discharge: 2011-06-01 | Disposition: A | Discharge status: Home / Self Care | Payer: Medicare Other | Source: Ambulatory Visit | Attending: Internal Medicine | Admitting: Internal Medicine

## 2011-06-01 NOTE — Progress Notes (Signed)
Physical Therapy Treatment Patient Details  Name: Jose Leach MRN: [id number] Date of Birth: 1932-12-18  Today's Date: 06/01/2011 Time: 6387-5643 Time Calculation (min): 45 min Charges: NMR x 25', TE x10',  PPT x10'  Visit#: 13  of 15   Re-eval: 06/04/11    Subjective: Symptoms/Limitations Symptoms: Pt reports that he feels that his balance is doing a little better.  He has been wearing a 1/2 inch insert around.  He continues to require the cane.  Pain Assessment Currently in Pain?: No/denies  Exercise/Treatments Mobility/Balance     Berg Balance Test Sit to Stand: Able to stand without using hands and stabilize independently Standing Unsupported: Able to stand safely 2 minutes Sitting with Back Unsupported but Feet Supported on Floor or Stool: Able to sit safely and securely 2 minutes Stand to Sit: Sits safely with minimal use of hands Transfers: Able to transfer safely, minor use of hands Standing Unsupported with Eyes Closed: Able to stand 10 seconds safely Standing Ubsupported with Feet Together: Able to place feet together independently and stand 1 minute safely From Standing, Reach Forward with Outstretched Arm: Can reach confidently >25 cm (10") From Standing Position, Pick up Object from Floor: Able to pick up shoe safely and easily From Standing Position, Turn to Look Behind Over each Shoulder: Looks behind from both sides and weight shifts well Turn 360 Degrees: Able to turn 360 degrees safely in 4 seconds or less Standing Unsupported, Alternately Place Feet on Step/Stool: Able to stand independently and safely and complete 8 steps in 20 seconds Standing Unsupported, One Foot in Front: Able to plae foot ahead of the other independently and hold 30 seconds Standing on One Leg: Able to lift leg independently and hold 5-10 seconds Total Score: 54   Machines for Strengthening Cybex Knee Extension: 2.5 PL 2x15 Cybex Knee Flexion: 4.5 PL 2x15 Standing Gait  Training: Indoor and Outdoor environment w/HHA outdoor environment during descents x6 minutes total Other Standing Knee Exercises: Hip Hikes x10 BLE Seated Other Seated Knee Exercises: Sit to Stand w/o UE support 21 sec  Balance Exercises Tandem Stance: Right;Left Tandem Stance Limitations: 4x30 sec w/min A w/o HHA  Physical Therapy Assessment and Plan PT Assessment and Plan Clinical Impression Statement: Pt reports that after outdoor ambulation had increased pain to his R hip which is likely due to even leg length w/heel lift in LLE.  RLE: 39.5 inches, LLE: 40 inches PT Plan: Re-eval next visit    Goals    Problem List Patient Active Problem List  Diagnoses  . HERPES ZOSTER  . HYPOTHYROIDISM  . HYPERLIPIDEMIA  . ANXIETY  . MIGRAINE HEADACHE  . MORTON'S NEUROMA, LEFT  . PREMATURE ATRIAL CONTRACTIONS  . CAROTID ARTERY DISEASE  . PERIPHERAL VASCULAR DISEASE  . ESOPHAGITIS  . INGUINAL HERNIA, LEFT, SMALL  . ARTHRITIS  . SPINAL STENOSIS  . GAIT IMBALANCE  . ABNORMAL ELECTROCARDIOGRAM  . ARTHRALGIA  . MUSCLE PAIN  . HEADACHE  . PRESSURE ULCER OTHER SITE  . HAMMER TOE  . Difficulty in walking  . Balance disorder       GP No functional reporting required  Leniya Breit 06/01/2011, 2:42 PM

## 2011-06-03 ENCOUNTER — Ambulatory Visit (HOSPITAL_COMMUNITY): Payer: Medicare Other | Admitting: Physical Therapy

## 2011-06-03 ENCOUNTER — Telehealth (HOSPITAL_COMMUNITY): Payer: Self-pay

## 2011-06-10 ENCOUNTER — Ambulatory Visit (HOSPITAL_COMMUNITY)
Admission: RE | Admit: 2011-06-10 | Discharge: 2011-06-10 | Disposition: A | Discharge status: Home / Self Care | Payer: Medicare Other | Source: Ambulatory Visit | Attending: Internal Medicine | Admitting: Internal Medicine

## 2011-06-11 NOTE — Progress Notes (Signed)
Physical Therapy Discharge Note Patient Details  Name: Jose Leach MRN: [id number] Date of Birth: 12-Nov-1932  Today's Date: 06/10/2011 Time: 1430 (started by PTA C.C for sub. info)-1515 Time Calculation (min): 45 min Charges: 45 Self care Visit#: 14  of    Re-eval:      Subjective: Symptoms/Limitations Symptoms: Pt reported a lateral fall on Monday picking up his reacher from floor with pain 6/10 L side wrist, elbow, hip and ankle. Reported no dizzy episode with fall.  Pt reports that he will be moving to an independent living facility in the near future and needs to pack and get ready for the move.   Pain Assessment Currently in Pain?: Yes Pain Score:   6 Pain Location: Wrist Pain Orientation: Left Pain Type: Acute pain  Exercise/Treatments Todays session focused on education for continuation of HEP.  Consulted with SLP to see if pt would benefit from SLP in the future. Discussed fall risks and how to decrease risk of falls as well as energy conservation for packing and moving. Special Tests: - heel bump, - compression to tib-fib and radial-ulnar, - for radial head fracture, - Tap test to each finger  Physical Therapy Assessment and Plan PT Assessment and Plan Clinical Impression Statement: Educated pt on HEP and importance of continuing with his HEP to improve overall balance.  Pt reports he wishes to stop PT secondary to upcoming move.  He has attended 14 OP PT visits over 6 weeks and before Monday he had not had reports of falls or difficulty with balance.  In fact he was reporting improvement overall.  Cleared him today of major orthopedic injuries, and likely his pain is coming from soft tissue pain.  I would recommend he continue with PT once he relocates to an independent living facility.  Would also recommend SLP to improve voice volume in order for his wife to hear him in case of another fall.  Pt will be d/c w/advanced HEP to continue as he wishes to stop PT for now.  PT  Plan: D/C w/advanced HEP    Goals Home Exercise Program Pt will Perform Home Exercise Program: Independently PT Goal: Perform Home Exercise Program - Progress: Met PT Short Term Goals Time to Complete Short Term Goals: 2 weeks PT Short Term Goal 1: Pt will demonstrate 3 STS without UE support PT Short Term Goal 1 - Progress: Met PT Short Term Goal 2: Pt will report 1 week without a fall. PT Short Term Goal 2 - Progress: Met (was previous to 4/22) PT Short Term Goal 3: Pt will improve LE strength to 4/5 overall. PT Short Term Goal 3 - Progress: Met PT Short Term Goal 4: Pt will ambulate in a closed environment independently x5 minutes.  PT Short Term Goal 4 - Progress: Met PT Long Term Goals Time to Complete Long Term Goals: 4 weeks PT Long Term Goal 1: Pt will improve his Berg Score to 45/56 for improved safety in the community.  PT Long Term Goal 1 - Progress: Met PT Long Term Goal 2: Pt will improve LE strength in order to tolerate ambulating in an open outdoor community environment for 30 minutes with LRAD without a LOB.  PT Long Term Goal 2 - Progress: Not met Long Term Goal 3: Pt will improve LE power and demonstrate recipriocal pattern while ascending and descending 5 stairs with 1 handrail. Long Term Goal 3 Progress: Met Long Term Goal 4: New Goal 05/07/11: Pt will improve gluteus medius strength  and endruance and demonstrate S/L clams 10x10 sec holds for improved body awareness Long Term Goal 4 Progress: Met PT Long Term Goal 5: New Goal 05/07/11: Pt will demonstrate tandem standing with appropriate shifting to BLE.  Long Term Goal 5 Progress: Met  Problem List Patient Active Problem List  Diagnoses  . HERPES ZOSTER  . HYPOTHYROIDISM  . HYPERLIPIDEMIA  . ANXIETY  . MIGRAINE HEADACHE  . MORTON'S NEUROMA, LEFT  . PREMATURE ATRIAL CONTRACTIONS  . CAROTID ARTERY DISEASE  . PERIPHERAL VASCULAR DISEASE  . ESOPHAGITIS  . INGUINAL HERNIA, LEFT, SMALL  . ARTHRITIS  . SPINAL  STENOSIS  . GAIT IMBALANCE  . ABNORMAL ELECTROCARDIOGRAM  . ARTHRALGIA  . MUSCLE PAIN  . HEADACHE  . PRESSURE ULCER OTHER SITE  . HAMMER TOE  . Difficulty in walking  . Balance disorder   Mikaiah Stoffer 06/11/2011, 1:59 PM

## 2011-06-22 ENCOUNTER — Ambulatory Visit: Payer: Medicare Other

## 2011-06-23 ENCOUNTER — Ambulatory Visit (INDEPENDENT_AMBULATORY_CARE_PROVIDER_SITE_OTHER): Payer: Medicare Other

## 2011-06-23 DIAGNOSIS — Z111 Encounter for screening for respiratory tuberculosis: Secondary | ICD-10-CM

## 2011-06-25 LAB — TB SKIN TEST: Induration: 0

## 2011-07-15 ENCOUNTER — Ambulatory Visit: Payer: Medicare Other | Admitting: *Deleted

## 2011-07-19 ENCOUNTER — Ambulatory Visit (HOSPITAL_COMMUNITY)
Admission: RE | Admit: 2011-07-19 | Discharge: 2011-07-19 | Disposition: A | Discharge status: Home / Self Care | Payer: Medicare Other | Source: Ambulatory Visit | Attending: Neurology | Admitting: Neurology

## 2011-07-19 DIAGNOSIS — M6281 Muscle weakness (generalized): Secondary | ICD-10-CM | POA: Insufficient documentation

## 2011-07-19 DIAGNOSIS — IMO0001 Reserved for inherently not codable concepts without codable children: Secondary | ICD-10-CM | POA: Insufficient documentation

## 2011-07-19 DIAGNOSIS — M25659 Stiffness of unspecified hip, not elsewhere classified: Secondary | ICD-10-CM | POA: Insufficient documentation

## 2011-07-19 DIAGNOSIS — E785 Hyperlipidemia, unspecified: Secondary | ICD-10-CM | POA: Insufficient documentation

## 2011-07-19 DIAGNOSIS — R269 Unspecified abnormalities of gait and mobility: Secondary | ICD-10-CM | POA: Insufficient documentation

## 2011-07-19 NOTE — Evaluation (Signed)
Physical Therapy Evaluation  Patient Details  Name: Jose Leach MRN: [id number] Date of Birth: 1932-09-18  Today's Date: 07/19/2011 Time: 1520-1608 PT Time Calculation (min): 48 min  Visit#: 1  of 12   Re-eval: 08/18/11 Assessment Diagnosis: personal hx of falling  Authorization: medicare   Authorization Time Period:    Authorization Visit#:   of     Past Medical History:  Past Medical History  Diagnosis Date  . Migraines     Dr [name]  . Peripheral neuropathy    Past Surgical History:  Past Surgical History  Procedure Date  . Appendectomy   . Lumbar laminectomy   . Carotid endarterectomy      L  . Tonsillectomy and adenoidectomy     Subjective Symptoms/Limitations Symptoms: Jose Leach states that has been having difficulty walking.  He states he was coming to therapy and thought he was doing well but since he stopped coming to therapy he has fallen three times.  He therefore called his MD and had therapy reordered. How long can you stand comfortably?: Pt able to stand for five minutes an then needs to sit down.  Pt stands with COG in front of BOS How long can you walk comfortably?: The patient is not walking for exercise as his wife is fearful that he will fall.  The patient uses a cane while walking. Pain Assessment Currently in Pain?: Yes (peripheral neuropathy-this will not be addressed )  Precautions/Restrictions   falls; L THR   Cognition/Observation  alert and oriented  Sensation/Coordination/Flexibility/Functional Tests Functional Tests Functional Tests: balance master overall LOS with one HH =36  Assessment RLE Strength Right Hip Flexion: 5/5 Right Hip Extension: 5/5 Right Hip ABduction: 5/5 Right Hip ADduction: 5/5 Right Knee Flexion: 5/5 Right Knee Extension: 5/5 Right Ankle Dorsiflexion: 5/5 LLE Strength Left Hip Flexion: 5/5 Left Hip Extension: 5/5 Left Hip ABduction: 3/5 Left Hip ADduction: 5/5 Left Knee Flexion: 5/5 Left Knee  Extension: 5/5 Left Ankle Dorsiflexion: 4/5  Exercise/Treatments Mobility/Balance  Berg Balance Test Sit to Stand: Able to stand  independently using hands Standing Unsupported: Able to stand safely 2 minutes Sitting with Back Unsupported but Feet Supported on Floor or Stool: Able to sit safely and securely 2 minutes Stand to Sit: Controls descent by using hands Transfers: Able to transfer with verbal cueing and /or supervision Standing Unsupported with Eyes Closed: Able to stand 10 seconds safely Standing Ubsupported with Feet Together: Able to place feet together independently and stand for 1 minute with supervision From Standing, Reach Forward with Outstretched Arm: Can reach forward >12 cm safely (5") From Standing Position, Pick up Object from Floor: Able to pick up shoe safely and easily From Standing Position, Turn to Look Behind Over each Shoulder: Looks behind one side only/other side shows less weight shift Turn 360 Degrees: Able to turn 360 degrees safely but slowly Standing Unsupported, Alternately Place Feet on Step/Stool: Able to stand independently and safely and complete 8 steps in 20 seconds Standing Unsupported, One Foot in Front: Able to plae foot ahead of the other independently and hold 30 seconds Standing on One Leg: Able to lift leg independently and hold equal to or more than 3 seconds Total Score: 44      Physical Therapy Assessment and Plan PT Assessment and Plan Clinical Impression Statement: Pt states that he stopped therapy due to feeling that he was improving but has had 3 falls since discharging himself from therpay and is fearful he is going to fracture  a bone.  Pt will benefit from skilled PT to improve balance and safety. Pt will benefit from skilled therapeutic intervention in order to improve on the following deficits: Abnormal gait (hx falls) Rehab Potential: Good PT Frequency: Min 3X/week PT Duration: 4 weeks PT Plan: Pt lives in [place] may take  therapy in [place].  Pt will contact us.  Pt strength is good he needs to work on balance activity, tandem gt, retro gt, balance master, sit to stand, SLS, cone rotation....    Goals Home Exercise Program Pt will Perform Home Exercise Program: Independently PT Short Term Goals Time to Complete Short Term Goals: 2 weeks PT Short Term Goal 1: Pt to increase Berg by 5  PT Short Term Goal 2: Pt to be able to come sit to stand 3 times with no hands without losing balance PT Long Term Goals Time to Complete Long Term Goals: 4 weeks PT Long Term Goal 1: Berg score to improve by 10 for pt to feel safe walking with cane PT Long Term Goal 2: Pt to be able to complete sit to stand 5 times in 11.6 seconds(norm) Long Term Goal 3: Pt to be able to complete 11 sit to stand in 30 seconds (norm data)  Problem List Patient Active Problem List  Diagnoses  . HERPES ZOSTER  . HYPOTHYROIDISM  . HYPERLIPIDEMIA  . ANXIETY  . MIGRAINE HEADACHE  . MORTON'S NEUROMA, LEFT  . PREMATURE ATRIAL CONTRACTIONS  . CAROTID ARTERY DISEASE  . PERIPHERAL VASCULAR DISEASE  . ESOPHAGITIS  . INGUINAL HERNIA, LEFT, SMALL  . ARTHRITIS  . SPINAL STENOSIS  . GAIT IMBALANCE  . ABNORMAL ELECTROCARDIOGRAM  . ARTHRALGIA  . MUSCLE PAIN  . HEADACHE  . PRESSURE ULCER OTHER SITE  . HAMMER TOE  . Difficulty in walking  . Balance disorder  . Hx of falling    PT - End of Session Equipment Utilized During Treatment: Gait belt Activity Tolerance: Patient tolerated treatment well General Behavior During Session: [name] for tasks performed Cognition: [facility] for tasks performed PT Plan of Care PT Home Exercise Plan: Has from last treatment  GP  CK was chosen due to 3 recent falls I felt he was more involved than the CJ(Berg );  CI as pt will always have some balance difficulties goal is to be fall free.  Functional Reporting Modifier  Current Status  [phone] - Mobility: Walking & Moving Around CK - At least 40% but less than 60%  impaired, limited or restricted  Goal Status  [id number] - Mobility: [place] & Moving Around CI - At least 1% but less than 20% impaired, limited or restricted  Jose Leach,CINDY  07/19/2011, 4:27 PM  Physician Documentation Your signature is required to indicate approval of the treatment plan as stated above.  Please sign and either send electronically or make a copy of this report for your files and return this physician signed original.   Please mark one 1.__approve of plan  2. ___approve of plan with the following conditions.   ______________________________                                                          _____________________ Physician Signature  Date  

## 2011-07-21 ENCOUNTER — Ambulatory Visit (INDEPENDENT_AMBULATORY_CARE_PROVIDER_SITE_OTHER): Payer: Medicare Other | Admitting: Internal Medicine

## 2011-07-21 ENCOUNTER — Encounter: Payer: Self-pay | Admitting: Internal Medicine

## 2011-07-21 VITALS — BP 118/76 | HR 52 | Wt 163.0 lb

## 2011-07-21 DIAGNOSIS — M4800 Spinal stenosis, site unspecified: Secondary | ICD-10-CM

## 2011-07-21 DIAGNOSIS — R269 Unspecified abnormalities of gait and mobility: Secondary | ICD-10-CM

## 2011-07-21 DIAGNOSIS — Z0000 Encounter for general adult medical examination without abnormal findings: Secondary | ICD-10-CM

## 2011-07-21 DIAGNOSIS — E039 Hypothyroidism, unspecified: Secondary | ICD-10-CM

## 2011-07-21 NOTE — Progress Notes (Signed)
Subjective:    Patient ID: Jose Leach, male    DOB: [birth year], 78 y.o.   MRN: [id number]  HPI Medicare Wellness Visit:  The following psychosocial & medical history were reviewed as required by Medicare.   Social history: caffeine: minimal, alcohol: 7 glasses wine / week ,  tobacco use : quit 1981  & exercise : in Physical Therapy.   Home & personal  safety / fall risk: yes due to neurologic issues (see below), activities of daily living: no limitations , seatbelt use : yes , and smoke alarm employment : yes .  Power of Attorney/Living Will status : in place Vision ( as recorded per Nurse) & Hearing  evaluation :  Ophth exam up to date. Hearing aid on L , Dr Marciano Sequin. Orientation :oriented X 3 , memory & recall :good,  math testing: good,and mood & affect : normal. Depression / anxiety: seeing  Dr [name] . Travel history : 2006 [place] , immunization status :up to date , transfusion history: not sure, and preventive health surveillance ( colonoscopies, BMD , etc as per protocol/ SOC):colonoscopy up to date; Dental care:  Seen every 6 months . Chart reviewed &  Updated. Active issues reviewed & addressed.      Review of Systems He is to enter [facility] .Past medical history/family history/social history were all reviewed and updated. He was seen by his neurologist 03/29/11 for evaluation of his polyneuropathy, B12 deficiency, essential tremor, and gait abnormality. Followup is scheduled in July. He is followed for testosterone deficiency by Dr. Earlene Plater.  Labs completed 03/16/11 were reviewed. BUN was mildly elevated at 31;  GFR 55.89; platelet count 133, 000; & glucose 132. Despite the elevated glucose his A1c was normal at 5.4%.       Objective:   Physical Exam Gen.: well-nourished in appearance. Alert, appropriate and cooperative throughout exam. Head: Normocephalic without obvious abnormalities; moustache & beard Eyes: No corneal or conjunctival inflammation noted. Pupils  equal round reactive to light and accommodation.Extraocular motion intact. Vision grossly normal with lenses. Ears: External  ear exam reveals no significant lesions or deformities. Canals clear .TMs normal. Hearing is grossly decreased on L; aid not worn today Nose: External nasal exam reveals no deformity or inflammation. Nasal mucosa are pink and moist. No lesions or exudates noted.   Mouth: Oral mucosa and oropharynx reveal no lesions or exudates. Teeth in good repair. Neck: No deformities, masses, or tenderness noted. Range of motion normal. Thyroid small Lungs: Normal respiratory effort; chest expands symmetrically. Lungs are clear to auscultation without rales, wheezes, or increased work of breathing. Heart: Normal rate and rhythm. Normal S1 and S2. No gallop, click, or rub. S4 w/o murmur. Heart sounds somewhat distant Abdomen: Bowel sounds normal; abdomen soft and nontender. No masses, organomegaly or hernias noted. Genitalia: Dr. [name]                                                                            Musculoskeletal/extremities: No deformity or scoliosis noted of  the thoracic or lumbar spine. No clubbing, cyanosis,  or deformity noted. Range of motion  normal .Tone increased; strength  normal.Joints normal. Nail health  good. Trace edema at ankles Vascular:  Carotid, radial artery, dorsalis pedis and  posterior tibial pulses are full and equal. No bruits present. Neurologic: Alert and oriented x3. Deep tendon reflexes symmetrical and normal. Intention tremor of hands. Resting head tremor . Gait is slightly broad-based; he walks with a cane        Skin: Intact without suspicious lesions or rashes. Minor punctate erythema of the tip of the nose Lymph: No cervical, axillary lymphadenopathy present. Psych: Mood and affect are normal. Normally interactive                                                                                         Assessment & Plan:  #1 Medicare  Wellness Exam; criteria met ; data entered #2 Problem List reviewed ; Assessment/ Recommendations made Plan: see Orders

## 2011-07-27 ENCOUNTER — Ambulatory Visit: Payer: Medicare Other | Attending: Neurology | Admitting: Physical Therapy

## 2011-07-27 DIAGNOSIS — IMO0001 Reserved for inherently not codable concepts without codable children: Secondary | ICD-10-CM | POA: Insufficient documentation

## 2011-07-27 DIAGNOSIS — R262 Difficulty in walking, not elsewhere classified: Secondary | ICD-10-CM | POA: Insufficient documentation

## 2011-07-30 ENCOUNTER — Ambulatory Visit: Payer: Medicare Other | Admitting: *Deleted

## 2011-08-03 ENCOUNTER — Ambulatory Visit: Payer: Medicare Other | Admitting: Physical Therapy

## 2011-08-04 ENCOUNTER — Telehealth: Payer: Self-pay

## 2011-08-04 NOTE — Telephone Encounter (Signed)
Per Dr.Hopper paperwork was sent off already. I called patient's wife and informed her that paperwork was mailed and if she doesn't receive with in the next few days patient to call back

## 2011-08-04 NOTE — Telephone Encounter (Signed)
Patient needs a copy of forms that was completed and a copy of OV. (OV printed and ready to mail)  Dr.Hopper please advise on forms for this patient

## 2011-08-04 NOTE — Telephone Encounter (Signed)
Message left on Triage voicemail: Patient's wife received a copy of her recent documents but did not receive a copy of her husbands. These documents are needed ASAP because they are moving into an assisted living facility.  I called and left message for Mrs.Pascal to call and verify which documents she needs a copy of.  Mrs.Petteway had labs done recently and those were mailed to her, her husband did not have labs done this month, his last lab work was in Jan (which was mailed to him at that time).

## 2011-08-05 ENCOUNTER — Ambulatory Visit: Payer: Medicare Other | Admitting: Physical Therapy

## 2011-08-10 ENCOUNTER — Ambulatory Visit: Payer: Medicare Other | Admitting: *Deleted

## 2011-08-12 ENCOUNTER — Ambulatory Visit: Payer: Medicare Other | Admitting: *Deleted

## 2011-08-16 ENCOUNTER — Ambulatory Visit: Payer: Medicare Other | Attending: Neurology | Admitting: Physical Therapy

## 2011-08-16 DIAGNOSIS — IMO0001 Reserved for inherently not codable concepts without codable children: Secondary | ICD-10-CM | POA: Insufficient documentation

## 2011-08-16 DIAGNOSIS — R262 Difficulty in walking, not elsewhere classified: Secondary | ICD-10-CM | POA: Insufficient documentation

## 2011-08-18 ENCOUNTER — Ambulatory Visit: Payer: Medicare Other | Admitting: Physical Therapy

## 2011-08-24 ENCOUNTER — Other Ambulatory Visit: Payer: Self-pay | Admitting: Internal Medicine

## 2011-08-24 ENCOUNTER — Ambulatory Visit: Payer: Medicare Other | Admitting: Physical Therapy

## 2011-08-26 ENCOUNTER — Ambulatory Visit: Payer: Medicare Other | Admitting: Physical Therapy

## 2011-08-31 ENCOUNTER — Ambulatory Visit: Payer: Medicare Other | Admitting: *Deleted

## 2011-09-02 ENCOUNTER — Ambulatory Visit: Payer: Medicare Other | Admitting: *Deleted

## 2011-09-06 ENCOUNTER — Ambulatory Visit: Payer: Medicare Other | Admitting: Physical Therapy

## 2011-09-08 ENCOUNTER — Ambulatory Visit: Payer: Medicare Other | Admitting: Physical Therapy

## 2011-09-14 ENCOUNTER — Ambulatory Visit: Payer: Medicare Other | Admitting: *Deleted

## 2011-09-16 ENCOUNTER — Encounter: Payer: Medicare Other | Admitting: *Deleted

## 2011-09-21 ENCOUNTER — Ambulatory Visit: Payer: Medicare Other | Attending: Neurology | Admitting: *Deleted

## 2011-09-21 DIAGNOSIS — IMO0001 Reserved for inherently not codable concepts without codable children: Secondary | ICD-10-CM | POA: Insufficient documentation

## 2011-09-21 DIAGNOSIS — R262 Difficulty in walking, not elsewhere classified: Secondary | ICD-10-CM | POA: Insufficient documentation

## 2011-09-23 ENCOUNTER — Ambulatory Visit: Payer: Medicare Other | Admitting: *Deleted

## 2011-09-29 ENCOUNTER — Encounter: Payer: Medicare Other | Admitting: *Deleted

## 2011-10-11 DIAGNOSIS — N320 Bladder-neck obstruction: Secondary | ICD-10-CM | POA: Insufficient documentation

## 2011-10-11 DIAGNOSIS — N400 Enlarged prostate without lower urinary tract symptoms: Secondary | ICD-10-CM

## 2011-10-11 DIAGNOSIS — N433 Hydrocele, unspecified: Secondary | ICD-10-CM

## 2011-10-11 DIAGNOSIS — E291 Testicular hypofunction: Secondary | ICD-10-CM | POA: Insufficient documentation

## 2011-10-11 HISTORY — DX: Hydrocele, unspecified: N43.3

## 2011-10-11 HISTORY — DX: Benign prostatic hyperplasia without lower urinary tract symptoms: N40.0

## 2011-11-02 ENCOUNTER — Encounter: Payer: Self-pay | Admitting: Gastroenterology

## 2011-11-02 DIAGNOSIS — H3530 Unspecified macular degeneration: Secondary | ICD-10-CM

## 2011-11-02 HISTORY — DX: Unspecified macular degeneration: H35.30

## 2011-11-10 ENCOUNTER — Encounter (HOSPITAL_COMMUNITY): Payer: Self-pay

## 2011-11-10 ENCOUNTER — Emergency Department (HOSPITAL_COMMUNITY)
Admission: EM | Admit: 2011-11-10 | Discharge: 2011-11-11 | Disposition: A | Discharge status: Home / Self Care | Payer: Medicare Other | Attending: Emergency Medicine | Admitting: Emergency Medicine

## 2011-11-10 ENCOUNTER — Emergency Department (HOSPITAL_COMMUNITY): Payer: Medicare Other

## 2011-11-10 ENCOUNTER — Emergency Department (INDEPENDENT_AMBULATORY_CARE_PROVIDER_SITE_OTHER)
Admission: EM | Admit: 2011-11-10 | Discharge: 2011-11-10 | Disposition: A | Discharge status: Other Acute Inpatient Hospital | Payer: Medicare Other | Source: Home / Self Care | Attending: Emergency Medicine | Admitting: Emergency Medicine

## 2011-11-10 ENCOUNTER — Encounter (HOSPITAL_COMMUNITY): Payer: Self-pay | Admitting: Emergency Medicine

## 2011-11-10 ENCOUNTER — Telehealth: Payer: Self-pay | Admitting: Internal Medicine

## 2011-11-10 DIAGNOSIS — R51 Headache: Secondary | ICD-10-CM | POA: Insufficient documentation

## 2011-11-10 DIAGNOSIS — S0990XA Unspecified injury of head, initial encounter: Secondary | ICD-10-CM

## 2011-11-10 DIAGNOSIS — M4802 Spinal stenosis, cervical region: Secondary | ICD-10-CM | POA: Insufficient documentation

## 2011-11-10 DIAGNOSIS — G250 Essential tremor: Secondary | ICD-10-CM | POA: Insufficient documentation

## 2011-11-10 DIAGNOSIS — G252 Other specified forms of tremor: Secondary | ICD-10-CM | POA: Insufficient documentation

## 2011-11-10 DIAGNOSIS — E039 Hypothyroidism, unspecified: Secondary | ICD-10-CM | POA: Insufficient documentation

## 2011-11-10 DIAGNOSIS — I10 Essential (primary) hypertension: Secondary | ICD-10-CM | POA: Insufficient documentation

## 2011-11-10 DIAGNOSIS — W06XXXA Fall from bed, initial encounter: Secondary | ICD-10-CM | POA: Insufficient documentation

## 2011-11-10 DIAGNOSIS — Z79899 Other long term (current) drug therapy: Secondary | ICD-10-CM | POA: Insufficient documentation

## 2011-11-10 NOTE — Telephone Encounter (Signed)
Referral to [facility] [place] would be appropriate as imaging may be necessary

## 2011-11-10 NOTE — ED Notes (Signed)
reportedly tripped and fell earlier today , struck head, flank on table; now c/o HA; family w pt

## 2011-11-10 NOTE — ED Notes (Signed)
Pt states he fell when he got out of bed at 3am this morning.  Reports hitting head on nightstand.  C/o continued headache throughout the day.  Denies dizziness, nausea, and vomiting.

## 2011-11-10 NOTE — Telephone Encounter (Signed)
Addendum: patient advised to go to ED since it is after 1600 per MD protocol.

## 2011-11-10 NOTE — ED Provider Notes (Signed)
Chief Complaint  Patient presents with  . Fall    History of Present Illness:  Jose Leach is a 77 year old male who fell in his apartment this morning at 3 AM. He slipped and hit his head against a bedside table. There was no loss of consciousness. He also hit his elbow and chest, but these are bothering him much right now. His only complaint is been ongoing headache throughout the day. He has not gotten any better or any worse. He does not have a bruise or a bump on his head. There is no bleeding. He denies any bleeding from the nose or from the ears. No diplopia or blurred vision. His neck is nonpainful and has a full range of motion. He denies any nausea or vomiting. He denies any localized muscle weakness, numbness, tingling, or difficulty with speech or swallowing. He has peripheral neuropathy, history of depression, hypercholesterolemia, and hypothyroidism. He takes a number of medications one of which is aspirin 81 mg a day. He states his blood pressures usually low normal.  Review of Systems:  Other than noted above, the patient denies any of the following symptoms: Systemic:  No fever or chills. Eye:  No eye pain, redness, diplopia or blurred vision ENT:  No bleeding from nose or ears.  No loose or broken teeth. Neck:  No pain or limited ROM. GI:  No nausea or vomiting. Neuro:  No loss of consciousness, seizure activity, numbness, tingling, or weakness.  PMFSH:  Past medical history, family history, social history, meds, and allergies were reviewed. No history of anticoagulent use.  Physical Exam:   Vital signs:  BP 200/90  Pulse 51  Temp 97.6 F (36.4 C) (Oral)  Resp 16  SpO2 100% General:  Alert and oriented times 3.  In no distress. Eye:  PERRL, full EOMs.  Lids and conjunctivas normal. Fundi benign. HEENT:  There is no pain to palpation over the skull or facial bones, no swelling or bruising.  TMs and canals normal, nasal mucosa normal.  No oral lacerations.  Teeth were intact  without obvious oral trauma. Neck:  Non tender.  Full ROM without pain. Neurological:  Alert and oriented.  Cranial nerves intact.  No pronator drift. Finger to nose test was normal.  No muscle weakness. DTRs were symmetrical.  Sensation was intact to light touch. Gait was normal.  Romberg's sign negative.  Able to perform tandem gait well.  Assessment:  The encounter diagnosis was Head injury.  He is at high risk given his age, ongoing headache, elevated blood pressure, and history of aspirin use.  Plan:   1.  The following meds were prescribed:   New Prescriptions   No medications on file   2.  The patient was transported to the emergency department via shuttle.    Reuben Likes, MD 11/10/11 [phone]

## 2011-11-10 NOTE — Telephone Encounter (Signed)
Caller: Ahmod/Patient; Patient Name: Jose Leach; PCP: [name]; Best Callback Phone Number: [phone]; Reason for call: Injury/Trauma. Called re slipped on carpeting and fell getting out of bed hitting head, elbow and chest on night table.  Onset: 11/10/11 at 0300.  Feeling nervous.  Reports frontal headache rated 4-5 out of 10.. Unable to recall exactly what happened.  Abrasions on L elbow and chest. Saw nurse at [facility].  Advised to see MD now for head injury and 76 years of age or older per Head Injury guideline. Since it is after 1630 and Dr Alwyn Ren has 1645 appointment, information noted and sent to [facility] Pool for immediate call back.

## 2011-11-10 NOTE — ED Notes (Addendum)
Pt sent from [facility].  Family concerned about elevated BP today.  States BP is usually low. Reports pain to R side of neck.

## 2011-11-10 NOTE — Telephone Encounter (Signed)
Noted, patient was recommended to go to ER. No appointment slots were available (Dr.Hopper does NOT have a 4:45 slot) due to the time of call we would have also recommended patient to go ER

## 2011-11-11 LAB — POCT I-STAT, CHEM 8
BUN: 29 mg/dL — ABNORMAL HIGH (ref 6–23)
Calcium, Ion: 1.24 mmol/L (ref 1.13–1.30)
Glucose, Bld: 89 mg/dL (ref 70–99)
TCO2: 26 mmol/L (ref 0–100)

## 2011-11-11 LAB — POCT I-STAT TROPONIN I

## 2011-11-11 MED ORDER — CLONIDINE HCL 0.1 MG PO TABS
0.1000 mg | ORAL_TABLET | Freq: Once | ORAL | Status: AC
  Administered 2011-11-11: 0.1 mg via ORAL
  Filled 2011-11-11: qty 1

## 2011-11-11 NOTE — ED Provider Notes (Addendum)
History     CSN: [id number]  Arrival date & time 11/10/11  2004   First MD Initiated Contact with Patient 11/10/11 2348      Chief Complaint  Patient presents with  . Fall  . Headache    (Consider location/radiation/quality/duration/timing/severity/associated sxs/prior treatment) Patient is a 78 y.o. male presenting with fall and headaches. The history is provided by the patient.  Fall The accident occurred yesterday. The fall occurred while walking. He fell from a height of 1 to 2 ft. The point of impact was the head. The pain is present in the head. The pain is at a severity of 3/10. The pain is mild. He was ambulatory at the scene. There was no entrapment after the fall. There was no drug use involved in the accident. There was no alcohol use involved in the accident. Associated symptoms include headaches.  Headache     Past Medical History  Diagnosis Date  . Migraines     Dr [name]  . Peripheral neuropathy   . Benign essential tremor   . Cervical stenosis of spine   . Anemia     B12 deficiency  . Testosterone deficiency     Dr [name], [facility]  . Hypothyroidism   . Hyperlipidemia   . Depression     Dr [name]  . History of shingles 2009    Past Surgical History  Procedure Date  . Appendectomy   . Lumbar laminectomy   . Carotid endarterectomy      L  . Tonsillectomy and adenoidectomy   . Total hip arthroplasty     L  . Cataract extraction     Family History  Problem Relation Age of Onset  . Stroke Mother 50  . Cancer Father     rectal  . Hypertension Mother   . Heart disease Father     in 10s  . Diabetes Mother   . Nephritis Sister   . Seizures Brother     died as child    History  Substance Use Topics  . Smoking status: Former Smoker    Types: Pipe, Cigars    Quit date: 02/16/1979  . Smokeless tobacco: Not on file   Comment: Quit about age 70   . Alcohol Use: 4.2 oz/week    7 Glasses of wine per week            Review of Systems    Neurological: Positive for headaches.  All other systems reviewed and are negative.    Allergies  Latex and Tape  Home Medications   Current Outpatient Rx  Name Route Sig Dispense Refill  . ASPIRIN 81 MG PO TABS Oral Take 81 mg by mouth daily.    . B COMPLEX VITAMINS PO CAPS Oral Take 1 capsule by mouth daily.    . BUPROPION HCL ER (XL) 300 MG PO TB24 Oral Take 300 mg by mouth daily.    Marland Kitchen VITAMIN D3 2000 UNITS PO TABS Oral Take by mouth. 2 by mouth daily    . FLAX SEEDS PO Oral Take by mouth.    Marland Kitchen KRILL OIL 500 MG PO CAPS Oral Take by mouth daily.    Marland Kitchen MIRTAZAPINE 45 MG PO TABS Oral Take 45 mg by mouth at bedtime.    . ICAPS PO Oral Take by mouth 2 (two) times daily.    Marland Kitchen NEPAFENAC 0.1 % OP SUSP Right Eye Place 1 drop into the right eye 2 (two) times daily.     Marland Kitchen  SERTRALINE HCL 100 MG PO TABS Oral Take 100 mg by mouth. 2 by mouth once daily    . SIMVASTATIN 40 MG PO TABS  TAKE ONE TABLET BY MOUTH AT BEDTIME 90 tablet 2  . SYNTHROID 50 MCG PO TABS  TAKE ONE TABLET BY MOUTH EVERY DAY EXCEPT ONE-HALF TABLET (25 MCG) ON SUNDAY. 30 each 5    Dispense as written.  . TESTOSTERONE 50 MG/5GM TD GEL Transdermal Place 5 g onto the skin daily.    . TOPIRAMATE 25 MG PO TABS Oral Take 25 mg by mouth. 3 by mouth daily (total 75mg  daily)      BP 215/56  Pulse 48  Temp 98.3 F (36.8 C)  Physical Exam  Constitutional: He is oriented to person, place, and time. He appears well-developed and well-nourished.  HENT:  Head: Normocephalic and atraumatic.  Eyes: Conjunctivae normal are normal. Pupils are equal, round, and reactive to light.  Neck: Normal range of motion. Neck supple.  Cardiovascular: Normal rate, regular rhythm, normal heart sounds and intact distal pulses.   Pulmonary/Chest: Effort normal and breath sounds normal.  Abdominal: Soft. Bowel sounds are normal.  Neurological: He is alert and oriented to person, place, and time.       Essential tremor noted  Skin: Skin is warm and  dry.  Psychiatric: He has a normal mood and affect. His behavior is normal. Judgment and thought content normal.    ED Course  Procedures (including critical care time)  Labs Reviewed - No data to display Ct Head Wo Contrast  11/10/2011  *RADIOLOGY REPORT*  Clinical Data:  78 year old male with fall, headache, cervical stenosis.  CT HEAD WITHOUT CONTRAST CT CERVICAL SPINE WITHOUT CONTRAST  Technique:  Multidetector CT imaging of the head and cervical spine was performed following the standard protocol without intravenous contrast.  Multiplanar CT image reconstructions of the cervical spine were also generated.  Comparison:   None  CT HEAD  Findings: No focal scalp hematoma identified. Visualized orbit soft tissues are within normal limits.  Calvarium intact.  Minimal ethmoid sinus mucosal thickening.  Other Visualized paranasal sinuses and mastoids are clear.  No ventriculomegaly. No midline shift, mass effect, or evidence of mass lesion.  No acute intracranial hemorrhage identified.  No evidence of cortically based acute infarction identified.  Wallace Cullens- white matter differentiation is within normal limits throughout the brain.  IMPRESSION: 1.  Normal for age noncontrast CT appearance of the brain. 2.  Cervical spine findings are below.  CT CERVICAL SPINE  Findings: Degenerative ligamentous hypertrophy about the odontoid. Visualized skull base is intact.  No atlanto-occipital dissociation.  Anterolisthesis of C7 on T1 measures 2-3 mm and is associated with severe facet hypertrophy.  Vertebral alignment elsewhere within normal limits. Bilateral posterior element alignment is within normal limits.  Multilevel chronic disc degeneration with endplate spurring.  Multilevel multifactorial cervical spinal stenosis probably most pronounced at C4-C5 (severe).  Gas in the right lateral recess at C6-C7 may be related to the disc extrusion.  No acute cervical fracture identified.  Negative lung apices. Visualized  paraspinal soft tissues are within normal limits.  IMPRESSION: 1. No acute fracture or acute listhesis identified in the cervical spine.  Ligamentous injury is not excluded. 2.  Multilevel cervical degenerative changes.  Multifactorial severe spinal stenosis at C4-C5.   Original Report Authenticated By: Harley Hallmark, M.D.    Ct Cervical Spine Wo Contrast  11/10/2011  *RADIOLOGY REPORT*  Clinical Data:  78 year old male with fall, headache, cervical  stenosis.  CT HEAD WITHOUT CONTRAST CT CERVICAL SPINE WITHOUT CONTRAST  Technique:  Multidetector CT imaging of the head and cervical spine was performed following the standard protocol without intravenous contrast.  Multiplanar CT image reconstructions of the cervical spine were also generated.  Comparison:   None  CT HEAD  Findings: No focal scalp hematoma identified. Visualized orbit soft tissues are within normal limits.  Calvarium intact.  Minimal ethmoid sinus mucosal thickening.  Other Visualized paranasal sinuses and mastoids are clear.  No ventriculomegaly. No midline shift, mass effect, or evidence of mass lesion.  No acute intracranial hemorrhage identified.  No evidence of cortically based acute infarction identified.  Wallace Cullens- white matter differentiation is within normal limits throughout the brain.  IMPRESSION: 1.  Normal for age noncontrast CT appearance of the brain. 2.  Cervical spine findings are below.  CT CERVICAL SPINE  Findings: Degenerative ligamentous hypertrophy about the odontoid. Visualized skull base is intact.  No atlanto-occipital dissociation.  Anterolisthesis of C7 on T1 measures 2-3 mm and is associated with severe facet hypertrophy.  Vertebral alignment elsewhere within normal limits. Bilateral posterior element alignment is within normal limits.  Multilevel chronic disc degeneration with endplate spurring.  Multilevel multifactorial cervical spinal stenosis probably most pronounced at C4-C5 (severe).  Gas in the right lateral recess  at C6-C7 may be related to the disc extrusion.  No acute cervical fracture identified.  Negative lung apices. Visualized paraspinal soft tissues are within normal limits.  IMPRESSION: 1. No acute fracture or acute listhesis identified in the cervical spine.  Ligamentous injury is not excluded. 2.  Multilevel cervical degenerative changes.  Multifactorial severe spinal stenosis at C4-C5.   Original Report Authenticated By: Harley Hallmark, M.D.      No diagnosis found.    MDM  + mechanical fall.  No loc.  + ha,  New onset htn.  Will antihypertensive,  Will reasess.  Ct neg    bp improved.  Will dc to [facility] with pmd forfurther eval of htn    Crespin Forstrom Lytle Michaels, MD 11/11/11 0028  Kai Calico Lytle Michaels, MD 11/11/11 [phone]

## 2011-11-11 NOTE — ED Notes (Signed)
Chem 8 results  Na  143 K  4.1 Cl  104 iCa  1.24 TC02  26 Glu  89 BUN  29 Crea  1.3 Hct  53 Hb  18.0 AnGap  18

## 2011-11-11 NOTE — ED Notes (Signed)
Troponin Results  cTnl  0.00 ng/mL

## 2011-11-11 NOTE — ED Notes (Signed)
PT. AMBULATED HALLWAY USING HIS WALKER WITH NO ASSISTANCE , EDP NOTIFIED.

## 2011-11-12 ENCOUNTER — Encounter: Payer: Self-pay | Admitting: Internal Medicine

## 2011-11-12 ENCOUNTER — Ambulatory Visit (INDEPENDENT_AMBULATORY_CARE_PROVIDER_SITE_OTHER): Payer: Medicare Other | Admitting: Internal Medicine

## 2011-11-12 VITALS — BP 124/78 | HR 55 | Temp 97.5°F | Wt 156.8 lb

## 2011-11-12 DIAGNOSIS — Z23 Encounter for immunization: Secondary | ICD-10-CM

## 2011-11-12 DIAGNOSIS — W1809XA Striking against other object with subsequent fall, initial encounter: Secondary | ICD-10-CM

## 2011-11-12 DIAGNOSIS — W1800XA Striking against unspecified object with subsequent fall, initial encounter: Secondary | ICD-10-CM

## 2011-11-12 DIAGNOSIS — M4800 Spinal stenosis, site unspecified: Secondary | ICD-10-CM

## 2011-11-12 NOTE — Addendum Note (Signed)
Addended by: Maurice Small on: 11/12/2011 04:36 PM   Modules accepted: Orders

## 2011-11-12 NOTE — Patient Instructions (Addendum)
Have non skid slippers & rolling walker  available @ bedside. Review and correct the record as indicated. Please share record with all medical staff seen.

## 2011-11-12 NOTE — Progress Notes (Signed)
  Subjective:    Patient ID: Jose Leach, male    DOB: 1933-02-07, 78 y.o.   MRN: [id number]  HPI The history and ER records were reviewed. He  slipped on a bedside carpet getting out of bed to go to bathroom; striking his head, elbow and chest a bedside table. There was no loss of consciousness.  His blood pressure was found to be 213/180; he did have a headache over the anterior crown. CT of the brain revealed no acute process. Cervical CT revealed disc disease and cervical stenosis @ C4-5      Review of Systems  There was no cardiac or neuro prodrome prior to event. There has been increased family health related stresses.     Objective:   Physical Exam  He is alert and oriented; appears adequately nourished in no distress  No carotid bruits are present. Heart rhythm is regular; there is a soft S4 gallop.  Chest is clear with no increased work of breathing  Deep tendon reflexes are symmetric and equal. Strength and tone are good.  He has a resting head tremor.  He is using a rolling walker which he has purchased        Assessment & Plan:  #1 blunt head, extremity, and thoracic, and fall. No history to suggest cardiac or neurologic predisposition to this incident.  #2 cervical stenosis C 4-5  Plan: Copies of this record and imaging provided for him share with his neurologist

## 2011-12-16 DIAGNOSIS — Z961 Presence of intraocular lens: Secondary | ICD-10-CM

## 2011-12-16 HISTORY — DX: Presence of intraocular lens: Z96.1

## 2012-01-19 DIAGNOSIS — R001 Bradycardia, unspecified: Secondary | ICD-10-CM | POA: Insufficient documentation

## 2012-02-16 HISTORY — PX: COLONOSCOPY: SHX174

## 2012-02-16 HISTORY — PX: EYE SURGERY: SHX253

## 2012-02-19 ENCOUNTER — Other Ambulatory Visit: Payer: Self-pay | Admitting: Internal Medicine

## 2012-02-21 NOTE — Telephone Encounter (Signed)
Lipid/Hep 272.4/995.20  

## 2012-03-15 ENCOUNTER — Other Ambulatory Visit: Payer: Self-pay | Admitting: Internal Medicine

## 2012-04-24 ENCOUNTER — Other Ambulatory Visit: Payer: Self-pay | Admitting: Internal Medicine

## 2012-04-24 DIAGNOSIS — E039 Hypothyroidism, unspecified: Secondary | ICD-10-CM

## 2012-04-24 DIAGNOSIS — E785 Hyperlipidemia, unspecified: Secondary | ICD-10-CM

## 2012-05-02 ENCOUNTER — Other Ambulatory Visit: Payer: Self-pay | Admitting: Internal Medicine

## 2012-05-03 NOTE — Telephone Encounter (Signed)
Future orders placed already

## 2012-05-05 ENCOUNTER — Other Ambulatory Visit: Payer: Self-pay | Admitting: Internal Medicine

## 2012-05-05 NOTE — Telephone Encounter (Signed)
Future orders placed 

## 2012-05-06 LAB — HM COLONOSCOPY

## 2012-05-29 ENCOUNTER — Other Ambulatory Visit: Payer: Self-pay | Admitting: Internal Medicine

## 2012-07-01 ENCOUNTER — Other Ambulatory Visit: Payer: Self-pay | Admitting: Internal Medicine

## 2012-07-04 ENCOUNTER — Ambulatory Visit (INDEPENDENT_AMBULATORY_CARE_PROVIDER_SITE_OTHER): Payer: Medicare Other | Admitting: Family Medicine

## 2012-07-04 ENCOUNTER — Encounter: Payer: Self-pay | Admitting: Family Medicine

## 2012-07-04 VITALS — BP 136/78 | HR 44 | Temp 98.0°F | Wt 156.0 lb

## 2012-07-04 DIAGNOSIS — S91302A Unspecified open wound, left foot, initial encounter: Secondary | ICD-10-CM

## 2012-07-04 DIAGNOSIS — S91309A Unspecified open wound, unspecified foot, initial encounter: Secondary | ICD-10-CM | POA: Insufficient documentation

## 2012-07-04 NOTE — Assessment & Plan Note (Signed)
Scabbed over Refer to podiatry for eval Pt instructed to wear shoes and be careful to avoid anything that can scratch/puncture skin

## 2012-07-04 NOTE — Progress Notes (Signed)
  Subjective:    Patient ID: Jose Leach, male    DOB: 01-May-1932, 79 y.o.   MRN: [id number]  HPI Pt here with his wife c/o black spot on back of L heel.  No pain, no bleeding, no pain in foot.    Review of Systems As above    Objective:   Physical Exam  BP 136/78  Pulse 44  Temp(Src) 98 F (36.7 C) (Oral)  Wt 156 lb (70.761 kg)  BMI 20.3 kg/m2  SpO2 96% General appearance: alert, cooperative, appears stated age and no distress Extremities: L foot-- black spot on heel-- nontender, no bleeding                      Pedal pulses palpated                      Feet warm , no swelling       Assessment & Plan:

## 2012-07-06 ENCOUNTER — Other Ambulatory Visit: Payer: Self-pay | Admitting: Internal Medicine

## 2012-07-06 NOTE — Telephone Encounter (Signed)
TSH 244.9 

## 2012-07-13 ENCOUNTER — Encounter: Payer: Self-pay | Admitting: Gastroenterology

## 2012-07-13 ENCOUNTER — Other Ambulatory Visit: Payer: Self-pay | Admitting: Internal Medicine

## 2012-07-14 NOTE — Telephone Encounter (Signed)
FUTURE ORDERS ALREADY PLACED

## 2012-07-21 ENCOUNTER — Telehealth: Payer: Self-pay | Admitting: Internal Medicine

## 2012-07-21 ENCOUNTER — Telehealth: Payer: Self-pay | Admitting: Gastroenterology

## 2012-07-21 NOTE — Telephone Encounter (Signed)
No bowel movement for several days.  Patient states he is impacted, and is unable to have bowel movement.  States he was this way on Monday, 07/17/12, and was able to clear himself, but cannot this time.

## 2012-07-21 NOTE — Telephone Encounter (Signed)
Pt indicated that he tried a suppositor with no relief. Pt notes that he does use fiber on a regular basis. Pt has not tried any laxative or miralax. Pt states that he was able to remove some stool on Monday with his hand but does feel he may have gotten it all. Pt c/o rectal pain but denies any abdominal pain,or swelling. Pt advise ED, Pt ok. Dr Drue Novel agreed Pt needs to be seen in ED.

## 2012-07-21 NOTE — Telephone Encounter (Signed)
Pt having problems with constipation. Had a BM on Monday and one today. Requests to be seen. Pt scheduled to see Willette Cluster NP Monday at 1:30pm. Pt aware of appt date and time.

## 2012-07-24 ENCOUNTER — Ambulatory Visit (INDEPENDENT_AMBULATORY_CARE_PROVIDER_SITE_OTHER): Payer: Medicare Other | Admitting: Nurse Practitioner

## 2012-07-24 ENCOUNTER — Other Ambulatory Visit (INDEPENDENT_AMBULATORY_CARE_PROVIDER_SITE_OTHER): Payer: Medicare Other

## 2012-07-24 ENCOUNTER — Encounter: Payer: Self-pay | Admitting: Nurse Practitioner

## 2012-07-24 VITALS — BP 120/60 | HR 74 | Ht 72.75 in | Wt 158.6 lb

## 2012-07-24 DIAGNOSIS — K5900 Constipation, unspecified: Secondary | ICD-10-CM

## 2012-07-24 DIAGNOSIS — Z800 Family history of malignant neoplasm of digestive organs: Secondary | ICD-10-CM

## 2012-07-24 DIAGNOSIS — K5909 Other constipation: Secondary | ICD-10-CM

## 2012-07-24 HISTORY — DX: Family history of malignant neoplasm of digestive organs: Z80.0

## 2012-07-24 LAB — TSH: TSH: 1.07 u[IU]/mL (ref 0.35–5.50)

## 2012-07-24 NOTE — Patient Instructions (Addendum)
You have been scheduled for a colonoscopy with propofol. Please follow written instructions given to you at your visit today.   If you use inhalers (even only as needed), please bring them with you on the day of your procedure. Your physician has requested that you go to www.startemmi.com and enter the access code given to you at your visit today. This web site gives a general overview about your procedure. However, you should still follow specific instructions given to you by our office regarding your preparation for the procedure.  Continue twice daily fiber. Take Miralax once daily and if after 3 days you have not moved your bowels, go to Miralax twice. Daily. We have given you a sample of Suprep for the colonoscopy prep.

## 2012-07-24 NOTE — Progress Notes (Signed)
HPI :  Patient is a 79 year old male known to Dr. Arlyce Dice. He had a colonoscopy September 2008 for a family history of colon cancer in a parent. The exam was normal.   Patient comes in today for evaluation of constipation. He has chronic constipation but it has progressive as of late. Patient has been taking supplemental fiber twice a day for about one year. He describes constipation as difficulty evacuating his bowels which occasionally requires manual disimpaction. No rectal bleeding. No weight loss. Timewise patient cannot correlate initiation of any medications with worsening constipation. Patient is overdue for a screening colonoscopy which was due September 2013. We mailed him a letter but he didn't get it.   Past Medical History  Diagnosis Date  . Migraines     Dr [name]  . Peripheral neuropathy   . Benign essential tremor   . Cervical stenosis of spine   . Anemia     B12 deficiency  . Testosterone deficiency     Dr [name], [facility]  . Hypothyroidism   . Hyperlipidemia   . Depression     Dr [name]  . History of shingles 2009    Family History  Problem Relation Age of Onset  . Stroke Mother 64  . Rectal cancer Father 87  . Hypertension Mother   . Heart disease Father     in 55s  . Diabetes Mother   . Nephritis Sister   . Seizures Brother     died as child   History  Substance Use Topics  . Smoking status: Former Smoker    Types: Pipe, Cigars    Quit date: 02/16/1979  . Smokeless tobacco: Never Used     Comment: Quit about age 89   . Alcohol Use: 4.2 oz/week    7 Glasses of wine per week     Comment:     Current Outpatient Prescriptions  Medication Sig Dispense Refill  . aspirin 81 MG tablet Take 81 mg by mouth daily.      Marland Kitchen b complex vitamins capsule Take 1 capsule by mouth daily.      Marland Kitchen buPROPion (WELLBUTRIN XL) 300 MG 24 hr tablet Take 300 mg by mouth daily.      . Cholecalciferol (VITAMIN D3) 2000 UNITS TABS Take by mouth. 2 by mouth daily      . Flaxseed,  Linseed, (FLAX SEEDS PO) Take by mouth.      [name] Oil 500 MG CAPS Take by mouth daily.      Marland Kitchen L-Methylfolate 7.5 MG TABS Take 1 tablet by mouth daily.      . mirtazapine (REMERON) 45 MG tablet Take 45 mg by mouth at bedtime.      . Multiple Vitamins-Minerals (ICAPS PO) Take by mouth 2 (two) times daily.      . nepafenac (NEVANAC) 0.1 % ophthalmic suspension Place 1 drop into the right eye 2 (two) times daily.       . sertraline (ZOLOFT) 100 MG tablet Take 100 mg by mouth. 2 by mouth once daily      . simvastatin (ZOCOR) 40 MG tablet TAKE 1 TABLET AT BEDTIME **DUE FOR LABS**  30 tablet  0  . SYNTHROID 50 MCG tablet LABS OVERDUE, 1 by mouth daily EXCEPT 1/2 on Sunday  30 tablet  0  . testosterone ([name]) 50 MG/5GM GEL Place 5 g onto the skin daily.      Marland Kitchen topiramate (TOPAMAX) 25 MG tablet Take 25 mg by mouth. 3 by mouth  daily (total 75mg  daily)      . vitamin B-12 (CYANOCOBALAMIN) 1000 MCG tablet Take 1,000 mcg by mouth daily.       No current facility-administered medications for this visit.   Allergies  Allergen Reactions  . Latex     REACTION: rash from adhesive  . Tape Rash    Paper tape is ok to use    Review of Systems: Positive for anxiety, arthritis, depression, hearing problems, and shortness of breath. All other systems reviewed and negative except where noted in HPI.   Physical Exam: BP 120/60  Pulse 74  Ht 6' 0.75" (1.848 m)  Wt 158 lb 9.6 oz (71.94 kg)  BMI 21.07 kg/m2 Constitutional: Peasant,well-developed, white male in no acute distress. HEENT: Normocephalic and atraumatic. Conjunctivae are normal. No scleral icterus. Neck supple.  Cardiovascular: Normal rate, regular rhythm.  Pulmonary/chest: Effort normal and breath sounds normal. No wheezing, rales or rhonchi. Abdominal: Soft, nondistended, nontender. Bowel sounds active throughout. There are no masses palpable. No hepatomegaly. Rectal: No impaction, no stool in vault Extremities: no edema Lymphadenopathy:  No cervical adenopathy noted. Neurological: HeadTremors . Alert and oriented to person place and time. Skin: Skin is warm and dry. No rashes noted. Psychiatric: Normal mood and affect. Behavior is normal.   ASSESSMENT AND PLAN: 1. Chronic constipation, progressive. Patient having to manually disimpact himself at times. Suspect functional. Continue twice daily fiber supplementation. Add daily MiraLax. If inadequate BM in 3 days may increase to BID. Check a TSH.  2. [facility] of colon cancer in a parent.  Patient's last colonoscopy was due September 2013. Will schedule surveillance colonoscopy. The risks, benefits, and alternatives to colonoscopy with possible biopsy and possible polypectomy were discussed with the patient and he consents to proceed.         \

## 2012-07-25 NOTE — Progress Notes (Signed)
Reviewed and agree with management. Robert D. Kaplan, M.D., FACG  

## 2012-07-27 ENCOUNTER — Encounter: Payer: Self-pay | Admitting: Cardiology

## 2012-07-30 ENCOUNTER — Emergency Department (HOSPITAL_COMMUNITY): Payer: Medicare Other

## 2012-07-30 ENCOUNTER — Encounter (HOSPITAL_COMMUNITY): Payer: Self-pay | Admitting: Emergency Medicine

## 2012-07-30 ENCOUNTER — Inpatient Hospital Stay (HOSPITAL_COMMUNITY)
Admission: EM | Admit: 2012-07-30 | Discharge: 2012-08-03 | DRG: 948 | Disposition: A | Discharge status: Skilled Nursing Facility | Payer: Medicare Other | Attending: Cardiovascular Disease | Admitting: Cardiovascular Disease

## 2012-07-30 DIAGNOSIS — E538 Deficiency of other specified B group vitamins: Secondary | ICD-10-CM | POA: Diagnosis present

## 2012-07-30 DIAGNOSIS — F3289 Other specified depressive episodes: Secondary | ICD-10-CM | POA: Diagnosis present

## 2012-07-30 DIAGNOSIS — R262 Difficulty in walking, not elsewhere classified: Secondary | ICD-10-CM | POA: Diagnosis present

## 2012-07-30 DIAGNOSIS — I2510 Atherosclerotic heart disease of native coronary artery without angina pectoris: Secondary | ICD-10-CM | POA: Diagnosis present

## 2012-07-30 DIAGNOSIS — G609 Hereditary and idiopathic neuropathy, unspecified: Secondary | ICD-10-CM | POA: Diagnosis present

## 2012-07-30 DIAGNOSIS — G250 Essential tremor: Secondary | ICD-10-CM | POA: Diagnosis present

## 2012-07-30 DIAGNOSIS — Z7982 Long term (current) use of aspirin: Secondary | ICD-10-CM

## 2012-07-30 DIAGNOSIS — E782 Mixed hyperlipidemia: Secondary | ICD-10-CM | POA: Diagnosis present

## 2012-07-30 DIAGNOSIS — G252 Other specified forms of tremor: Secondary | ICD-10-CM | POA: Diagnosis present

## 2012-07-30 DIAGNOSIS — R001 Bradycardia, unspecified: Secondary | ICD-10-CM

## 2012-07-30 DIAGNOSIS — R5381 Other malaise: Secondary | ICD-10-CM | POA: Diagnosis present

## 2012-07-30 DIAGNOSIS — R531 Weakness: Secondary | ICD-10-CM

## 2012-07-30 DIAGNOSIS — E785 Hyperlipidemia, unspecified: Secondary | ICD-10-CM | POA: Diagnosis present

## 2012-07-30 DIAGNOSIS — I6529 Occlusion and stenosis of unspecified carotid artery: Secondary | ICD-10-CM | POA: Diagnosis present

## 2012-07-30 DIAGNOSIS — Z87891 Personal history of nicotine dependence: Secondary | ICD-10-CM

## 2012-07-30 DIAGNOSIS — Z9181 History of falling: Secondary | ICD-10-CM

## 2012-07-30 DIAGNOSIS — K5900 Constipation, unspecified: Secondary | ICD-10-CM | POA: Diagnosis present

## 2012-07-30 DIAGNOSIS — I498 Other specified cardiac arrhythmias: Secondary | ICD-10-CM

## 2012-07-30 DIAGNOSIS — Z96649 Presence of unspecified artificial hip joint: Secondary | ICD-10-CM

## 2012-07-30 DIAGNOSIS — Z79899 Other long term (current) drug therapy: Secondary | ICD-10-CM

## 2012-07-30 DIAGNOSIS — R5383 Other fatigue: Principal | ICD-10-CM | POA: Diagnosis present

## 2012-07-30 DIAGNOSIS — I779 Disorder of arteries and arterioles, unspecified: Secondary | ICD-10-CM | POA: Diagnosis present

## 2012-07-30 DIAGNOSIS — E039 Hypothyroidism, unspecified: Secondary | ICD-10-CM | POA: Diagnosis present

## 2012-07-30 DIAGNOSIS — K5909 Other constipation: Secondary | ICD-10-CM

## 2012-07-30 DIAGNOSIS — I10 Essential (primary) hypertension: Secondary | ICD-10-CM | POA: Diagnosis present

## 2012-07-30 DIAGNOSIS — F329 Major depressive disorder, single episode, unspecified: Secondary | ICD-10-CM | POA: Diagnosis present

## 2012-07-30 HISTORY — DX: Personal history of other medical treatment: Z92.89

## 2012-07-30 LAB — CBC WITH DIFFERENTIAL/PLATELET
Basophils Absolute: 0 10*3/uL (ref 0.0–0.1)
Basophils Relative: 0 % (ref 0–1)
Eosinophils Absolute: 0.2 10*3/uL (ref 0.0–0.7)
Eosinophils Relative: 4 % (ref 0–5)
HCT: 45 % (ref 39.0–52.0)
MCH: 31.2 pg (ref 26.0–34.0)
MCHC: 33.8 g/dL (ref 30.0–36.0)
MCV: 92.4 fL (ref 78.0–100.0)
Monocytes Absolute: 0.4 10*3/uL (ref 0.1–1.0)
RDW: 13.4 % (ref 11.5–15.5)

## 2012-07-30 LAB — URINALYSIS, ROUTINE W REFLEX MICROSCOPIC
Glucose, UA: NEGATIVE mg/dL
Hgb urine dipstick: NEGATIVE
Leukocytes, UA: NEGATIVE
Specific Gravity, Urine: 1.013 (ref 1.005–1.030)
Urobilinogen, UA: 0.2 mg/dL (ref 0.0–1.0)

## 2012-07-30 LAB — COMPREHENSIVE METABOLIC PANEL
AST: 15 U/L (ref 0–37)
Albumin: 3.5 g/dL (ref 3.5–5.2)
BUN: 34 mg/dL — ABNORMAL HIGH (ref 6–23)
Calcium: 9 mg/dL (ref 8.4–10.5)
Creatinine, Ser: 1.4 mg/dL — ABNORMAL HIGH (ref 0.50–1.35)

## 2012-07-30 LAB — POCT I-STAT TROPONIN I: Troponin i, poc: 0.01 ng/mL (ref 0.00–0.08)

## 2012-07-30 MED ORDER — SODIUM CHLORIDE 0.9 % IV BOLUS (SEPSIS)
1000.0000 mL | Freq: Once | INTRAVENOUS | Status: AC
  Administered 2012-07-30: 1000 mL via INTRAVENOUS

## 2012-07-30 NOTE — ED Notes (Signed)
Pt returned from CT °

## 2012-07-30 NOTE — ED Notes (Signed)
Per EMS, pt has been weak x2 days. Pt was bradycardic in he 40's on monitor upon EMS arrival, pt states his baseline is in the 60's.  Pt denies pain, SOB, N/V, and dizziness. EMS gave a 250 bolus. BP 146/96, HR 48. Pt is a/o x4.

## 2012-07-30 NOTE — H&P (Signed)
Jose Leach is an 79 y.o. male.   Chief Complaint: Weakness HPI: 79 yo man with PMH of hypothyroidism, dyslipidemia, peripheral neuropathy, anemia, on testosterone patches, depression here with feeling weak for the past two days. He has not walked well for some time but he could walk almost a city block 2-3 days ago. He has no baseline PND/orthopnea/SOB. No syncope. He uses a walker. He is having falls 2/2 balance issues. Two days ago he began feeling weak with no energy. When he stands up he notices a little bit of lightheadedness. He has no sick contacts, no fever/chills. The only other thing he brings up is that he has been battling constipation daily. His last BM was early morning day of admission. He was scheduled for colonoscopy later this week.   Past Medical History  Diagnosis Date  . Migraines     Dr [name]  . Peripheral neuropathy   . Benign essential tremor   . Cervical stenosis of spine   . Anemia     B12 deficiency  . Testosterone deficiency     Dr [name], [facility]  . Hypothyroidism   . Hyperlipidemia   . Depression     Dr [name]  . History of shingles 2009    Past Surgical History  Procedure Laterality Date  . Appendectomy    . Lumbar laminectomy    . Carotid endarterectomy       L  . Tonsillectomy and adenoidectomy    . Total hip arthroplasty      L  . Cataract extraction Right   . Eye surgery Right 02/2012    retina    Family History  Problem Relation Age of Onset  . Stroke Mother 41  . Rectal cancer Father 19  . Hypertension Mother   . Heart disease Father     in 63s  . Diabetes Mother   . Nephritis Sister   . Seizures Brother     died as child   Social History:  reports that he quit smoking about 33 years ago. His smoking use included Pipe and Cigars. He has never used smokeless tobacco. He reports that he drinks about 4.2 ounces of alcohol per week. He reports that he does not use illicit drugs. Family history: father with CAD  Allergies:   Allergies  Allergen Reactions  . Latex     REACTION: rash from adhesive  . Tape Rash    Paper tape is ok to use     (Not in a hospital admission)  Results for orders placed during the hospital encounter of 07/30/12 (from the past 48 hour(s))  URINALYSIS, ROUTINE W REFLEX MICROSCOPIC     Status: None   Collection Time    07/30/12  8:53 PM      Result Value Range   Color, Urine YELLOW  YELLOW   APPearance CLEAR  CLEAR   Specific Gravity, Urine 1.013  1.005 - 1.030   pH 7.0  5.0 - 8.0   Glucose, UA NEGATIVE  NEGATIVE mg/dL   Hgb urine dipstick NEGATIVE  NEGATIVE   Bilirubin Urine NEGATIVE  NEGATIVE   Ketones, ur NEGATIVE  NEGATIVE mg/dL   Protein, ur NEGATIVE  NEGATIVE mg/dL   Urobilinogen, UA 0.2  0.0 - 1.0 mg/dL   Nitrite NEGATIVE  NEGATIVE   Leukocytes, UA NEGATIVE  NEGATIVE   Comment: MICROSCOPIC NOT DONE ON URINES WITH NEGATIVE PROTEIN, BLOOD, LEUKOCYTES, NITRITE, OR GLUCOSE <1000 mg/dL.  CBC WITH DIFFERENTIAL     Status:  Abnormal   Collection Time    07/30/12  9:18 PM      Result Value Range   WBC 5.0  4.0 - 10.5 K/uL   RBC 4.87  4.22 - 5.81 MIL/uL   Hemoglobin 15.2  13.0 - 17.0 g/dL   HCT 16.1  09.6 - 04.5 %   MCV 92.4  78.0 - 100.0 fL   MCH 31.2  26.0 - 34.0 pg   MCHC 33.8  30.0 - 36.0 g/dL   RDW 40.9  81.1 - 91.4 %   Platelets 124 (*) 150 - 400 K/uL   Neutrophils Relative % 57  43 - 77 %   Neutro Abs 2.8  1.7 - 7.7 K/uL   Lymphocytes Relative 31  12 - 46 %   Lymphs Abs 1.5  0.7 - 4.0 K/uL   Monocytes Relative 9  3 - 12 %   Monocytes Absolute 0.4  0.1 - 1.0 K/uL   Eosinophils Relative 4  0 - 5 %   Eosinophils Absolute 0.2  0.0 - 0.7 K/uL   Basophils Relative 0  0 - 1 %   Basophils Absolute 0.0  0.0 - 0.1 K/uL  COMPREHENSIVE METABOLIC PANEL     Status: Abnormal   Collection Time    07/30/12  9:18 PM      Result Value Range   Sodium 137  135 - 145 mEq/L   Potassium 4.5  3.5 - 5.1 mEq/L   Chloride 105  96 - 112 mEq/L   CO2 27  19 - 32 mEq/L   Glucose,  Bld 99  70 - 99 mg/dL   BUN 34 (*) 6 - 23 mg/dL   Creatinine, Ser 7.82 (*) 0.50 - 1.35 mg/dL   Calcium 9.0  8.4 - 95.6 mg/dL   Total Protein 5.9 (*) 6.0 - 8.3 g/dL   Albumin 3.5  3.5 - 5.2 g/dL   AST 15  0 - 37 U/L   ALT 12  0 - 53 U/L   Alkaline Phosphatase 36 (*) 39 - 117 U/L   Total Bilirubin 0.2 (*) 0.3 - 1.2 mg/dL   GFR calc non Af Amer 47 (*) >90 mL/min   GFR calc Af Amer 54 (*) >90 mL/min   Comment:            The eGFR has been calculated     using the CKD EPI equation.     This calculation has not been     validated in all clinical     situations.     eGFR's persistently     <90 mL/min signify     possible Chronic Kidney Disease.  POCT I-STAT TROPONIN I     Status: None   Collection Time    07/30/12  9:23 PM      Result Value Range   Troponin i, poc 0.01  0.00 - 0.08 ng/mL   Comment 3            Comment: Due to the release kinetics of cTnI,     a negative result within the first hours     of the onset of symptoms does not rule out     myocardial infarction with certainty.     If myocardial infarction is still suspected,     repeat the test at appropriate intervals.   Dg Chest 2 View  07/30/2012   *RADIOLOGY REPORT*  Clinical Data: Weakness.  CHEST - 2 VIEW  Comparison: Chest x-ray of 03/11/2003.  Findings:  Lung volumes are normal.  No consolidative airspace disease.  No pleural effusions.  No pneumothorax.  No pulmonary nodule or mass noted.  Pulmonary vasculature and the cardiomediastinal silhouette are within normal limits. Atherosclerosis in the thoracic aorta.  IMPRESSION: 1. No radiographic evidence of acute cardiopulmonary disease. 2.  Atherosclerosis.   Original Report Authenticated By: Trudie Reed, M.D.   Ct Head Wo Contrast  07/30/2012   *RADIOLOGY REPORT*  Clinical Data: Weakness.  CT HEAD WITHOUT CONTRAST  Technique:  Contiguous axial images were obtained from the base of the skull through the vertex without contrast.  Comparison: Head CT 11/10/2011.   Findings: Mild cerebral atrophy.  Patchy and confluent areas of decreased attenuation throughout the deep and periventricular white matter of the cerebral hemispheres bilaterally, similar to the prior study, most compatible with chronic microvascular ischemic disease.  No acute intracranial abnormalities.  Specifically, no evidence of acute intracranial hemorrhage, no definite findings of acute/subacute cerebral ischemia, no mass, mass effect, hydrocephalus or abnormal intra or extra-axial fluid collections. Visualized paranasal sinuses and mastoids are well pneumatized.  No acute displaced skull fractures are identified.  IMPRESSION: 1.  No acute intracranial abnormalities. 2.  Mild cerebral and cerebellar atrophy with chronic microvascular ischemic changes of the cerebral white matter, similar to the prior study.   Original Report Authenticated By: Trudie Reed, M.D.    Review of Systems  Constitutional: Positive for malaise/fatigue. Negative for fever, chills and weight loss.  HENT: Positive for hearing loss. Negative for ear pain and neck pain.   Eyes: Negative for photophobia and pain.  Respiratory: Negative for cough and shortness of breath.   Cardiovascular: Negative for chest pain, palpitations, leg swelling and PND.  Gastrointestinal: Positive for constipation. Negative for heartburn, nausea, vomiting, abdominal pain, diarrhea, blood in stool and melena.  Genitourinary: Negative for dysuria, urgency and frequency.  Musculoskeletal: Negative for myalgias and back pain.  Skin: Negative for itching and rash.  Neurological: Positive for weakness. Negative for dizziness, tingling, tremors, speech change, focal weakness and headaches.  Endo/Heme/Allergies: Negative for polydipsia. Does not bruise/bleed easily.  Psychiatric/Behavioral: Negative for suicidal ideas, hallucinations and substance abuse.    Blood pressure 145/77, pulse 40, temperature 98.6 F (37 C), resp. rate 18, SpO2  99.00%. Physical Exam  Nursing note and vitals reviewed. Constitutional: He is oriented to person, place, and time. No distress.  Thin man in NAD; looks stated age or slightly older  HENT:  Head: Normocephalic and atraumatic.  Nose: Nose normal.  Mouth/Throat: Oropharynx is clear and moist. No oropharyngeal exudate.  Eyes: Conjunctivae and EOM are normal. Pupils are equal, round, and reactive to light. No scleral icterus.  Neck: Normal range of motion. Neck supple. No tracheal deviation present. No thyromegaly present.  Cardiovascular: Regular rhythm, normal heart sounds and intact distal pulses.  Exam reveals no gallop.   No murmur heard. bradycardia  Respiratory: Effort normal and breath sounds normal. No respiratory distress. He has no wheezes. He has no rales.  GI: Soft. Bowel sounds are normal. He exhibits no distension. There is no tenderness.  Musculoskeletal: Normal range of motion. He exhibits no edema and no tenderness.  Neurological: He is alert and oriented to person, place, and time. No cranial nerve deficit.  Skin: Skin is warm and dry. No rash noted. He is not diaphoretic. No erythema.  Psychiatric: He has a normal mood and affect. His behavior is normal. Judgment and thought content normal.  Labs reviewed; wbc 5, h/h 15.2/45, plt 124, na 137,  K 4.5, bun/cr 34/1.4, albumin 3.5, ast/alt 15/12, Troponin 0.01 CTH unrevealing Chest x-ray reviewed; No acute process ECG reviewed; sinus bradycardia with HR 40 1/05 nuclear stress with EF 62%, no ischemia  Problem List Symptomatic bradycardia Constipation Weakness Dyslipidemia Prior Anemia presumed 2/2 B12 deficiency Peripheral neuropathy Prior CEA of left carotid Depression  Assessment/Plan 79 yo man with PMH of dyslipidemia, peripheral neuropathy, depression who has weakness, constipation now with symptomatic bradycardia. Differential for bradycardia is ischemia, progressive conduction system disease, medications,  metabolic disarray. I favor a diagnosis of progressive conduction disease that may require placement of permanent pacemaker. However, will evaluate thyroid, infection, ischemia, echocardiogram. NPO after MN also.  - telemetry - trend troponins - obtain updated Echocardiogram - review medication list with pharmacy, particularly antidepressant drugs - NPO after MN  Macon Lesesne 07/31/2012, 12:06 AM

## 2012-07-30 NOTE — ED Provider Notes (Signed)
History     CSN: [id number]  Arrival date & time 07/30/12  2017   First MD Initiated Contact with Patient 07/30/12 2018      Chief Complaint  Patient presents with  . Bradycardia  . Weakness    (Consider location/radiation/quality/duration/timing/severity/associated sxs/prior treatment) The history is provided by the patient.  Jose Leach is a 79 y.o. male history of peripheral neuropathy, anemia here with weakness. Diffuse weakness and unable to get out of the car very well for the last 2 days. He has baseline tremors that are stable. Denies fevers or chills or cough. Denies chest pain or shortness of breath. Per EMS, he is bradycardic to 40s so came to [facility] for eval.    Past Medical History  Diagnosis Date  . Migraines     Dr [name]  . Peripheral neuropathy   . Benign essential tremor   . Cervical stenosis of spine   . Anemia     B12 deficiency  . Testosterone deficiency     Dr [name], [facility]  . Hypothyroidism   . Hyperlipidemia   . Depression     Dr [name]  . History of shingles 2009    Past Surgical History  Procedure Laterality Date  . Appendectomy    . Lumbar laminectomy    . Carotid endarterectomy       L  . Tonsillectomy and adenoidectomy    . Total hip arthroplasty      L  . Cataract extraction Right   . Eye surgery Right 02/2012    retina    Family History  Problem Relation Age of Onset  . Stroke Mother 2  . Rectal cancer Father 37  . Hypertension Mother   . Heart disease Father     in 50s  . Diabetes Mother   . Nephritis Sister   . Seizures Brother     died as child    History  Substance Use Topics  . Smoking status: Former Smoker    Types: Pipe, Cigars    Quit date: 02/16/1979  . Smokeless tobacco: Never Used     Comment: Quit about age 67   . Alcohol Use: 4.2 oz/week    7 Glasses of wine per week     Comment:        Review of Systems  Neurological: Positive for weakness.  All other systems reviewed and are  negative.    Allergies  Latex and Tape  Home Medications   Current Outpatient Rx  Name  Route  Sig  Dispense  Refill  . aspirin 81 MG tablet   Oral   Take 81 mg by mouth daily.         Marland Kitchen b complex vitamins capsule   Oral   Take 1 capsule by mouth daily.         . bimatoprost (LUMIGAN) 0.01 % SOLN   Both Eyes   Place 1 drop into both eyes at bedtime.         Marland Kitchen buPROPion (WELLBUTRIN XL) 300 MG 24 hr tablet   Oral   Take 300 mg by mouth daily.         . Cholecalciferol (VITAMIN D3) 2000 UNITS TABS   Oral   Take 4,000 Units by mouth daily.          . Flaxseed, Linseed, (FLAX SEEDS PO)   Oral   Take 1 tablet by mouth daily.          [name]  Oil 500 MG CAPS   Oral   Take 500 mg by mouth daily.          Marland Kitchen L-Methylfolate 7.5 MG TABS   Oral   Take 1 tablet by mouth daily.         Marland Kitchen levothyroxine (SYNTHROID, LEVOTHROID) 50 MCG tablet   Oral   Take 25-50 mcg by mouth daily before breakfast. 50 mcg daily except on sundays pt takes 25 mcg         . mirtazapine (REMERON) 45 MG tablet   Oral   Take 45 mg by mouth at bedtime.         . Multiple Vitamins-Minerals (ICAPS PO)   Oral   Take 1 capsule by mouth 2 (two) times daily.          . nepafenac (NEVANAC) 0.1 % ophthalmic suspension   Right Eye   Place 1 drop into the right eye 2 (two) times daily.          . prednisoLONE acetate (PRED FORTE) 1 % ophthalmic suspension   Right Eye   Place 1 drop into the right eye 3 (three) times daily.         . sertraline (ZOLOFT) 100 MG tablet   Oral   Take 200 mg by mouth daily.          . simvastatin (ZOCOR) 40 MG tablet   Oral   Take 40 mg by mouth at bedtime.         Marland Kitchen testosterone ([name]) 50 MG/5GM GEL   Transdermal   Place 5 g onto the skin daily.         Marland Kitchen topiramate (TOPAMAX) 25 MG tablet   Oral   Take 25-50 mg by mouth 2 (two) times daily. 25 mg in the a.m. And 50 mg in the p.m.         Marland Kitchen vitamin B-12 (CYANOCOBALAMIN) 1000  MCG tablet   Oral   Take 1,000 mcg by mouth daily.           BP 145/77  Pulse 40  Temp(Src) 98.6 F (37 C)  Resp 18  SpO2 99%  Physical Exam  Nursing note and vitals reviewed. Constitutional: He is oriented to person, place, and time.  Chronically ill, thin, intentional tremors   HENT:  Head: Normocephalic.  Mouth/Throat: Oropharynx is clear and moist.  Eyes: Conjunctivae are normal. Pupils are equal, round, and reactive to light.  Neck: Normal range of motion. Neck supple.  Cardiovascular: Regular rhythm and normal heart sounds.   Bradycardic   Pulmonary/Chest: Effort normal and breath sounds normal. No respiratory distress. He has no wheezes. He has no rales.  Abdominal: Soft. Bowel sounds are normal. He exhibits no distension. There is no tenderness. There is no rebound and no guarding.  Musculoskeletal: Normal range of motion.  Neurological: He is alert and oriented to person, place, and time.  4/5 strength bilateral legs   Skin: Skin is warm and dry.  Psychiatric: He has a normal mood and affect. His behavior is normal. Judgment and thought content normal.    ED Course  Procedures (including critical care time)  Labs Reviewed  CBC WITH DIFFERENTIAL - Abnormal; Notable for the following:    Platelets 124 (*)    All other components within normal limits  COMPREHENSIVE METABOLIC PANEL - Abnormal; Notable for the following:    BUN 34 (*)    Creatinine, Ser 1.40 (*)    Total Protein 5.9 (*)  Alkaline Phosphatase 36 (*)    Total Bilirubin 0.2 (*)    GFR calc non Af Amer 47 (*)    GFR calc Af Amer 54 (*)    All other components within normal limits  URINALYSIS, ROUTINE W REFLEX MICROSCOPIC  POCT I-STAT TROPONIN I   Dg Chest 2 View  07/30/2012   *RADIOLOGY REPORT*  Clinical Data: Weakness.  CHEST - 2 VIEW  Comparison: Chest x-ray of 03/11/2003.  Findings: Lung volumes are normal.  No consolidative airspace disease.  No pleural effusions.  No pneumothorax.  No  pulmonary nodule or mass noted.  Pulmonary vasculature and the cardiomediastinal silhouette are within normal limits. Atherosclerosis in the thoracic aorta.  IMPRESSION: 1. No radiographic evidence of acute cardiopulmonary disease. 2.  Atherosclerosis.   Original Report Authenticated By: Trudie Reed, M.D.   Ct Head Wo Contrast  07/30/2012   *RADIOLOGY REPORT*  Clinical Data: Weakness.  CT HEAD WITHOUT CONTRAST  Technique:  Contiguous axial images were obtained from the base of the skull through the vertex without contrast.  Comparison: Head CT 11/10/2011.  Findings: Mild cerebral atrophy.  Patchy and confluent areas of decreased attenuation throughout the deep and periventricular white matter of the cerebral hemispheres bilaterally, similar to the prior study, most compatible with chronic microvascular ischemic disease.  No acute intracranial abnormalities.  Specifically, no evidence of acute intracranial hemorrhage, no definite findings of acute/subacute cerebral ischemia, no mass, mass effect, hydrocephalus or abnormal intra or extra-axial fluid collections. Visualized paranasal sinuses and mastoids are well pneumatized.  No acute displaced skull fractures are identified.  IMPRESSION: 1.  No acute intracranial abnormalities. 2.  Mild cerebral and cerebellar atrophy with chronic microvascular ischemic changes of the cerebral white matter, similar to the prior study.   Original Report Authenticated By: Trudie Reed, M.D.     No diagnosis found.   Date: 07/30/2012  Rate: 41  Rhythm: sinus bradycardia  QRS Axis: normal  Intervals: normal  ST/T Wave abnormalities: normal  Conduction Disutrbances:right bundle branch block  Narrative Interpretation: rate slightly lower   Old EKG Reviewed: changes noted    MDM  Jose Leach is a 79 y.o. male here with weakness and bradycardia. Will consider infections vs symptomatic bradycardia vs electrolyte abnormalities. Will get labs, CXR, UA, trop.    10 PM I called Dr. Tresa Endo from cardiology who will evaluate the patient and will admit patient.        Richardean Canal, MD 07/31/12 [phone]

## 2012-07-30 NOTE — ED Notes (Signed)
Pt transported to CT ?

## 2012-07-30 NOTE — ED Notes (Signed)
Cardiology at bedside.

## 2012-07-31 ENCOUNTER — Encounter (HOSPITAL_COMMUNITY): Payer: Self-pay | Admitting: Nurse Practitioner

## 2012-07-31 DIAGNOSIS — I498 Other specified cardiac arrhythmias: Secondary | ICD-10-CM

## 2012-07-31 DIAGNOSIS — E039 Hypothyroidism, unspecified: Secondary | ICD-10-CM | POA: Insufficient documentation

## 2012-07-31 LAB — CBC
HCT: 45.4 % (ref 39.0–52.0)
HCT: 43 % (ref 39.0–52.0)
Hemoglobin: 15.1 g/dL (ref 13.0–17.0)
Hemoglobin: 14.4 g/dL (ref 13.0–17.0)
MCH: 30.4 pg (ref 26.0–34.0)
MCH: 30.8 pg (ref 26.0–34.0)
MCHC: 33.3 g/dL (ref 30.0–36.0)
MCHC: 33.5 g/dL (ref 30.0–36.0)
MCV: 91.5 fL (ref 78.0–100.0)
MCV: 92.1 fL (ref 78.0–100.0)
Platelets: 116 K/uL — ABNORMAL LOW (ref 150–400)
Platelets: 109 K/uL — ABNORMAL LOW (ref 150–400)
RBC: 4.96 MIL/uL (ref 4.22–5.81)
RBC: 4.67 MIL/uL (ref 4.22–5.81)
RDW: 13.2 % (ref 11.5–15.5)
RDW: 13.3 % (ref 11.5–15.5)
WBC: 5.2 K/uL (ref 4.0–10.5)
WBC: 5.1 K/uL (ref 4.0–10.5)

## 2012-07-31 LAB — HEMOGLOBIN A1C
Hgb A1c MFr Bld: 5.3 %
Mean Plasma Glucose: 105 mg/dL

## 2012-07-31 LAB — BASIC METABOLIC PANEL WITH GFR
BUN: 29 mg/dL — ABNORMAL HIGH (ref 6–23)
CO2: 26 meq/L (ref 19–32)
Calcium: 8.6 mg/dL (ref 8.4–10.5)
Chloride: 109 meq/L (ref 96–112)
Creatinine, Ser: 1.28 mg/dL (ref 0.50–1.35)
GFR calc Af Amer: 60 mL/min — ABNORMAL LOW
GFR calc non Af Amer: 52 mL/min — ABNORMAL LOW
Glucose, Bld: 83 mg/dL (ref 70–99)
Potassium: 3.9 meq/L (ref 3.5–5.1)
Sodium: 139 meq/L (ref 135–145)

## 2012-07-31 LAB — MAGNESIUM: Magnesium: 2.1 mg/dL (ref 1.5–2.5)

## 2012-07-31 LAB — CREATININE, SERUM
Creatinine, Ser: 1.3 mg/dL (ref 0.50–1.35)
GFR calc Af Amer: 59 mL/min — ABNORMAL LOW
GFR calc non Af Amer: 51 mL/min — ABNORMAL LOW

## 2012-07-31 MED ORDER — PREDNISOLONE ACETATE 1 % OP SUSP
1.0000 [drp] | Freq: Three times a day (TID) | OPHTHALMIC | Status: DC
  Administered 2012-07-31 – 2012-08-03 (×10): 1 [drp] via OPHTHALMIC
  Filled 2012-07-31: qty 1

## 2012-07-31 MED ORDER — TESTOSTERONE 50 MG/5GM (1%) TD GEL
5.0000 g | Freq: Every day | TRANSDERMAL | Status: DC
  Administered 2012-07-31 – 2012-08-02 (×3): 5 g via TRANSDERMAL
  Filled 2012-07-31 (×3): qty 5

## 2012-07-31 MED ORDER — ASPIRIN 81 MG PO TABS: 81.0000 mg | ORAL_TABLET | Freq: Every day | ORAL | Status: DC

## 2012-07-31 MED ORDER — VITAMIN D3 50 MCG (2000 UT) PO TABS: 4000.0000 [IU] | ORAL_TABLET | Freq: Every day | ORAL | Status: DC

## 2012-07-31 MED ORDER — FOLIC ACID 1 MG PO TABS
1.0000 mg | ORAL_TABLET | Freq: Every day | ORAL | Status: DC
  Administered 2012-07-31 – 2012-08-03 (×4): 1 mg via ORAL
  Filled 2012-07-31 (×4): qty 1

## 2012-07-31 MED ORDER — LEVOTHYROXINE SODIUM 25 MCG PO TABS
50.0000 ug | ORAL_TABLET | ORAL | Status: DC
  Administered 2012-07-31 – 2012-08-03 (×4): 50 ug via ORAL
  Filled 2012-07-31 (×5): qty 2

## 2012-07-31 MED ORDER — LEVOTHYROXINE SODIUM 25 MCG PO TABS: 25.0000 ug | ORAL_TABLET | ORAL | Status: DC

## 2012-07-31 MED ORDER — AMLODIPINE BESYLATE 2.5 MG PO TABS
2.5000 mg | ORAL_TABLET | Freq: Every day | ORAL | Status: DC
  Filled 2012-07-31: qty 1

## 2012-07-31 MED ORDER — SERTRALINE HCL 100 MG PO TABS
200.0000 mg | ORAL_TABLET | Freq: Every day | ORAL | Status: DC
  Administered 2012-07-31 – 2012-08-03 (×4): 200 mg via ORAL
  Filled 2012-07-31 (×4): qty 2

## 2012-07-31 MED ORDER — HEPARIN SODIUM (PORCINE) 5000 UNIT/ML IJ SOLN
5000.0000 [IU] | Freq: Three times a day (TID) | INTRAMUSCULAR | Status: DC
  Administered 2012-07-31 – 2012-08-03 (×10): 5000 [IU] via SUBCUTANEOUS
  Filled 2012-07-31 (×13): qty 1

## 2012-07-31 MED ORDER — VITAMIN D3 25 MCG (1000 UNIT) PO TABS
4000.0000 [IU] | ORAL_TABLET | Freq: Every day | ORAL | Status: DC
  Administered 2012-07-31 – 2012-08-03 (×4): 4000 [IU] via ORAL
  Filled 2012-07-31 (×4): qty 4

## 2012-07-31 MED ORDER — B COMPLEX VITAMINS PO CAPS: 1.0000 | ORAL_CAPSULE | Freq: Every day | ORAL | Status: DC

## 2012-07-31 MED ORDER — TOPIRAMATE 25 MG PO TABS
25.0000 mg | ORAL_TABLET | Freq: Every day | ORAL | Status: DC
  Administered 2012-07-31 – 2012-08-03 (×4): 25 mg via ORAL
  Filled 2012-07-31 (×4): qty 1

## 2012-07-31 MED ORDER — KRILL OIL 500 MG PO CAPS: 500.0000 mg | ORAL_CAPSULE | Freq: Every day | ORAL | Status: DC

## 2012-07-31 MED ORDER — L-METHYLFOLATE 7.5 MG PO TABS: 1.0000 | ORAL_TABLET | Freq: Every day | ORAL | Status: DC

## 2012-07-31 MED ORDER — BIMATOPROST 0.01 % OP SOLN
1.0000 [drp] | Freq: Every day | OPHTHALMIC | Status: DC
  Administered 2012-07-31 – 2012-08-03 (×3): 1 [drp] via OPHTHALMIC
  Filled 2012-07-31: qty 2.5

## 2012-07-31 MED ORDER — BUPROPION HCL ER (XL) 300 MG PO TB24
300.0000 mg | ORAL_TABLET | Freq: Every day | ORAL | Status: DC
  Administered 2012-07-31 – 2012-08-03 (×4): 300 mg via ORAL
  Filled 2012-07-31 (×4): qty 1

## 2012-07-31 MED ORDER — KETOROLAC TROMETHAMINE 0.5 % OP SOLN
1.0000 [drp] | Freq: Two times a day (BID) | OPHTHALMIC | Status: DC
  Administered 2012-07-31 – 2012-08-03 (×6): 1 [drp] via OPHTHALMIC
  Filled 2012-07-31: qty 3

## 2012-07-31 MED ORDER — SIMVASTATIN 40 MG PO TABS
40.0000 mg | ORAL_TABLET | Freq: Every day | ORAL | Status: DC
  Administered 2012-07-31 – 2012-08-03 (×3): 40 mg via ORAL
  Filled 2012-07-31 (×5): qty 1

## 2012-07-31 MED ORDER — B COMPLEX-C PO TABS
1.0000 | ORAL_TABLET | Freq: Every day | ORAL | Status: DC
  Administered 2012-07-31 – 2012-08-03 (×4): 1 via ORAL
  Filled 2012-07-31 (×4): qty 1

## 2012-07-31 MED ORDER — ASPIRIN 81 MG PO CHEW
81.0000 mg | CHEWABLE_TABLET | Freq: Every day | ORAL | Status: DC
  Administered 2012-07-31 – 2012-08-02 (×3): 81 mg via ORAL
  Filled 2012-07-31 (×3): qty 1

## 2012-07-31 MED ORDER — VITAMIN B-12 1000 MCG PO TABS
1000.0000 ug | ORAL_TABLET | Freq: Every day | ORAL | Status: DC
  Administered 2012-07-31 – 2012-08-03 (×4): 1000 ug via ORAL
  Filled 2012-07-31 (×4): qty 1

## 2012-07-31 MED ORDER — OMEGA-3-ACID ETHYL ESTERS 1 G PO CAPS
1.0000 g | ORAL_CAPSULE | Freq: Every day | ORAL | Status: DC
  Administered 2012-07-31 – 2012-08-03 (×4): 1 g via ORAL
  Filled 2012-07-31 (×4): qty 1

## 2012-07-31 MED ORDER — NEPAFENAC 0.1 % OP SUSP: 1.0000 [drp] | Freq: Two times a day (BID) | OPHTHALMIC | Status: DC

## 2012-07-31 MED ORDER — TOPIRAMATE 25 MG PO TABS
50.0000 mg | ORAL_TABLET | Freq: Every day | ORAL | Status: DC
  Administered 2012-07-31 – 2012-08-03 (×3): 50 mg via ORAL
  Filled 2012-07-31 (×5): qty 2

## 2012-07-31 MED ORDER — HYDRALAZINE HCL 20 MG/ML IJ SOLN: 10.0000 mg | INTRAMUSCULAR | Status: DC | PRN

## 2012-07-31 MED ORDER — CAPTOPRIL 6.25 MG HALF TABLET
6.2500 mg | ORAL_TABLET | Freq: Three times a day (TID) | ORAL | Status: DC
  Administered 2012-07-31 – 2012-08-03 (×9): 6.25 mg via ORAL
  Filled 2012-07-31 (×13): qty 1

## 2012-07-31 NOTE — Consult Note (Signed)
ELECTROPHYSIOLOGY CONSULT NOTE  Patient ID: Jose Leach MRN: [id number], DOB/AGE: 1933-01-23   Admit date: 07/30/2012 Date of Consult: 07/31/2012  Primary Physician: [name], MD Primary Neurologist: [name], MD Primary Cardiologist: previously Samule Ohm, MD and Excell Seltzer, MD Reason for Consultation: Bradycardia  History of Present Illness Mr. Jose Leach is a 79 year old man with PAD s/p left CEA 2007, dyslipidemia, hypothyroidism, spinal stenosis, vitamin B12 deficiency and depression who was admitted yesterday with progressive fatigue and weakness. He is accompanied by his wife and son who assist with history questions. Apparently he fell 3 days ago while walking outside. This was a mechanical fall as his wife reports he ran his walker into the curb and fell to his knees. She reports he did not lose consciousness or sustain any obvious injury but "it shook him up quite a bit." Since that time he has progressively "gone downhill." He has had fatigue and weakness x 2 days. On admission he was found to have sinus bradycardia in the 40s; therefore, we have been asked to see him in consultation for EP recommendations. He denies CP, SOB or palpitations. He denies dizziness, near syncope or syncope. His troponin is negative and his echo is pending. His Hgb/Hct, electrolytes and thyroid studies are within normal range. He reports the only other symptom he has struggled with recently is constipation x 2 weeks. He was evaluated by GI last Monday and a colonoscopy was scheduled for this week. Of note, Mr. Jose Leach also reports being evaluated by Neurology, Dr. [name], for LE weakness/falls and has been ambulating with a walker since Sept 2013. On further review of his medical records, he has reported progressive fatigue/weakness since 2008 (see his last visit with Dr. Samule Ohm) at which time he was bradycardic with a heart rate of 47 bpm. He then underwent an ETT which was negative for ischemia with  appropriate chronotropic response. He was also seen in follow-up by Dr. Excell Seltzer for LE PAD in 2009 at which time his ABI was 0.86 on the right and 1.0 on the left. He has been lost to follow-up since that time.   Past Medical History Past Medical History  Diagnosis Date  . Migraines     Dr [name]  . Peripheral neuropathy   . Benign essential tremor   . Cervical stenosis of spine   . Anemia     B12 deficiency  . Testosterone deficiency     Dr [name], [facility]  . Hypothyroidism   . Hyperlipidemia   . Depression     Dr [name]  . History of shingles 2009  . H/O cardiovascular stress test     a. 02/2003 -> no ischemia/infarct.    Past Surgical History Past Surgical History  Procedure Laterality Date  . Appendectomy    . Lumbar laminectomy    . Carotid endarterectomy       L  . Tonsillectomy and adenoidectomy    . Total hip arthroplasty      L  . Cataract extraction Right   . Eye surgery Right 02/2012    retina    Allergies/Intolerances Allergies  Allergen Reactions  . Latex     REACTION: rash from adhesive  . Tape Rash    Paper tape is ok to use    Inpatient Medications . aspirin  81 mg Oral Daily  . B-complex with vitamin C  1 tablet Oral Daily  . bimatoprost  1 drop Both Eyes QHS  . buPROPion  300 mg Oral Daily  .  captopril  6.25 mg Oral TID  . cholecalciferol  4,000 Units Oral Daily  . folic acid  1 mg Oral Daily  . heparin  5,000 Units Subcutaneous Q8H  . ketorolac  1 drop Right Eye BID  . [START ON 08/06/2012] levothyroxine  25 mcg Oral Custom  . levothyroxine  50 mcg Oral Custom  . omega-3 acid ethyl esters  1 g Oral Daily  . prednisoLONE acetate  1 drop Right Eye TID  . sertraline  200 mg Oral Daily  . simvastatin  40 mg Oral QHS  . testosterone  5 g Transdermal Daily  . topiramate  25 mg Oral Daily  . topiramate  50 mg Oral QHS  . vitamin B-12  1,000 mcg Oral Daily   Family History Family History  Problem Relation Age of Onset  . Stroke Mother 90    . Rectal cancer Father 63  . Hypertension Mother   . Heart disease Father     in 68s  . Diabetes Mother   . Nephritis Sister   . Seizures Brother     died as child    Social History History   Social History  . Marital Status: Married    Spouse Name: Alvino Chapel    Number of Children: 2  . Years of Education: N/A   Occupational History  . retired    Social History Main Topics  . Smoking status: Former Smoker    Types: Pipe, Cigars    Quit date: 02/16/1979  . Smokeless tobacco: Never Used     Comment: Quit about age 23   . Alcohol Use: 4.2 oz/week    7 Glasses of wine per week     Comment:    . Drug Use: No  . Sexually Active: Not on file   Other Topics Concern  . Not on file   Social History Narrative  . No narrative on file    Review of Systems General: No chills, fever, night sweats or weight changes  Cardiovascular:  No chest pain, dyspnea on exertion, edema, orthopnea, palpitations, paroxysmal nocturnal dyspnea Dermatological: No rash, lesions or masses Respiratory: No cough, dyspnea Urologic: No hematuria, dysuria Abdominal: No nausea, vomiting, diarrhea, bright red blood per rectum, melena, or hematemesis Neurologic: No visual changes, changes in mental status All other systems reviewed and are otherwise negative except as noted above.  Physical Exam Blood pressure 123/54, pulse 43, temperature 98.7 F (37.1 C), temperature source Oral, resp. rate 21, height 6\' 1"  (1.854 m), weight 153 lb 8 oz (69.627 kg), SpO2 95.00%.  General: Well developed, frail appearing 79 year old male in no acute distress. HEENT: Normocephalic, atraumatic. EOMs intact. Sclera nonicteric. Oropharynx clear.  Neck: Supple without bruits. No JVD. Lungs: Respirations regular and unlabored, CTA bilaterally. No wheezes, rales or rhonchi. Heart: Regular, bradycardic. S1, S2 present. No murmurs, rub, S3 or S4. Abdomen: Soft, non-tender, non-distended. BS present x 4 quadrants. No  hepatosplenomegaly.  Extremities: No clubbing, cyanosis or edema. Radials/popliteal/DP/PT pulses present and equal bilaterally. Psych: Normal affect. Neuro: Alert and oriented X 3. Moves all extremities spontaneously. Musculoskeletal: Mild kyphosis. UE strength appropriate and symmetric. LE strength diminished. Skin: Intact. Warm and dry. No rashes or petechiae in exposed areas.   Labs  Recent Labs  07/31/12 0202 07/31/12 0727  TROPONINI <0.30 <0.30   Lab Results  Component Value Date   WBC 5.1 07/31/2012   HGB 14.4 07/31/2012   HCT 43.0 07/31/2012   MCV 92.1 07/31/2012   PLT 109*  07/31/2012    Recent Labs Lab 07/30/12 2118  07/31/12 0455  NA 137  --  139  K 4.5  --  3.9  CL 105  --  109  CO2 27  --  26  BUN 34*  --  29*  CREATININE 1.40*  < > 1.28  CALCIUM 9.0  --  8.6  PROT 5.9*  --   --   BILITOT 0.2*  --   --   ALKPHOS 36*  --   --   ALT 12  --   --   AST 15  --   --   GLUCOSE 99  --  83  < > = values in this interval not displayed.   Recent Labs  07/31/12 0203  TSH 1.436    Radiology/Studies Dg Chest 2 View  07/30/2012   *RADIOLOGY REPORT*  Clinical Data: Weakness.  CHEST - 2 VIEW  Comparison: Chest x-ray of 03/11/2003.  Findings: Lung volumes are normal.  No consolidative airspace disease.  No pleural effusions.  No pneumothorax.  No pulmonary nodule or mass noted.  Pulmonary vasculature and the cardiomediastinal silhouette are within normal limits. Atherosclerosis in the thoracic aorta.  IMPRESSION: 1. No radiographic evidence of acute cardiopulmonary disease. 2.  Atherosclerosis.   Original Report Authenticated By: Trudie Reed, M.D.   Ct Head Wo Contrast  07/30/2012   *RADIOLOGY REPORT*  Clinical Data: Weakness.  CT HEAD WITHOUT CONTRAST  Technique:  Contiguous axial images were obtained from the base of the skull through the vertex without contrast.  Comparison: Head CT 11/10/2011.  Findings: Mild cerebral atrophy.  Patchy and confluent areas of decreased  attenuation throughout the deep and periventricular white matter of the cerebral hemispheres bilaterally, similar to the prior study, most compatible with chronic microvascular ischemic disease.  No acute intracranial abnormalities.  Specifically, no evidence of acute intracranial hemorrhage, no definite findings of acute/subacute cerebral ischemia, no mass, mass effect, hydrocephalus or abnormal intra or extra-axial fluid collections. Visualized paranasal sinuses and mastoids are well pneumatized.  No acute displaced skull fractures are identified.  IMPRESSION: 1.  No acute intracranial abnormalities. 2.  Mild cerebral and cerebellar atrophy with chronic microvascular ischemic changes of the cerebral white matter, similar to the prior study.   Original Report Authenticated By: Trudie Reed, M.D.    Echocardiogram  pending  12-lead ECG on admission shows sinus bradycardia at 41 bpm Telemetry shows sinus bradycardia in the 40s predominantly; one sinus pause today ~3 seconds in duration   Assessment and Plan 1. Sinus bradycardia, sinus pause ~3 seconds 2. Physical deconditioning 3. PVD 4. Hypothyroidism 5. Spinal stenosis  Mr. Tabron presents with mechanical fall 3 days ago and progressive weakness/fatigue, found to have sinus bradycardia. He has had documented sinus bradycardia before, in 2008. His echo is pending. His CEs are negative. His electrolytes, Hgb/Hct and TSH are within normal range. His weakness and fatigue may be related to his bradyarrhythmia; although given his low functional status and physical deconditioning this is difficult to assess. He is taking several antidepressants. Remeron, Zoloft and Topamax do not have any cardiovascular adverse effects listed in their profiles (info per [employer] in [employer]). However, Wellbutrin lists cardiac dysrhythmia as an adverse effect in 5.3% of patients, more commonly tachyarrhythmias 10.8% of time.  Dr. Graciela Husbands to  see Signed, Rick Duff, PA-C 07/31/2012, 2:19 PM

## 2012-07-31 NOTE — Progress Notes (Signed)
Patient admitted from Independent living unit at [facility] where he lives with his wife- they have been there since last September. Discussed possible need for rehab (SNF unit at [facility]) and she is open to this if needed- CSW will follow for further assessment and d/c planning- [name], MSW, [name] [phone]

## 2012-07-31 NOTE — Progress Notes (Signed)
Patient Name: Jose Leach Date of Encounter: 07/31/2012   Principal Problem:   Symptomatic sinus bradycardia Active Problems:   HYPOTHYROIDISM   HYPERLIPIDEMIA   CAROTID ARTERY DISEASE   Hx of falling   SUBJECTIVE  No chest pain or sob.  Tired this AM.  Reports a 2 wk h/o constipation with difficult BM on Saturday night, keeping him up until 1 AM.  Felt tired, fatigued throughout the day yesterday.  Though he previously reported lightheadedness, he now says that he was not lightheaded.  He remains bradycardic at rest, dipping into the high 30's overnight with rises into the 50's.  Intervals have been normal and there's been no evidence of high grade heart block.  Hypertensive this AM.  CURRENT MEDS . amLODipine  2.5 mg Oral Daily  . aspirin  81 mg Oral Daily  . B-complex with vitamin C  1 tablet Oral Daily  . bimatoprost  1 drop Both Eyes QHS  . buPROPion  300 mg Oral Daily  . cholecalciferol  4,000 Units Oral Daily  . folic acid  1 mg Oral Daily  . heparin  5,000 Units Subcutaneous Q8H  . ketorolac  1 drop Right Eye BID  . [START ON 08/06/2012] levothyroxine  25 mcg Oral Custom  . levothyroxine  50 mcg Oral Custom  . omega-3 acid ethyl esters  1 g Oral Daily  . prednisoLONE acetate  1 drop Right Eye TID  . sertraline  200 mg Oral Daily  . simvastatin  40 mg Oral QHS  . testosterone  5 g Transdermal Daily  . topiramate  25 mg Oral Daily  . topiramate  50 mg Oral QHS  . vitamin B-12  1,000 mcg Oral Daily   OBJECTIVE  Filed Vitals:   07/30/12 2315 07/31/12 0041 07/31/12 0119 07/31/12 0645  BP: 145/77 140/74 173/73 216/94  Pulse: 40 40 52 55  Temp:  98.5 F (36.9 C) 98.1 F (36.7 C) 98.4 F (36.9 C)  TempSrc:  Oral    Resp: 18 12 20 20   Height:   6\' 1"  (1.854 m)   Weight:   153 lb 8 oz (69.627 kg)   SpO2: 99% 95% 98% 95%    Intake/Output Summary (Last 24 hours) at 07/31/12 0711 Last data filed at 07/31/12 0500  Gross per 24 hour  Intake   1000 ml    Output    650 ml  Net    350 ml   Filed Weights   07/31/12 0119  Weight: 153 lb 8 oz (69.627 kg)   PHYSICAL EXAM  General: Pleasant, NAD.  Resting tremor. Neuro: Alert and oriented X 3. Moves all extremities spontaneously. Psych: Flat affect. HEENT:  Normal  Neck: Supple without bruits or JVD. Lungs:  Resp regular and unlabored, CTA. Heart: RRR, brady, no s3, s4, or murmurs. Abdomen: Soft, non-tender, non-distended, BS + x 4.  Extremities: No clubbing, cyanosis or edema. DP/PT/Radials 1+ and equal bilaterally.  Accessory Clinical Findings  CBC  Recent Labs  07/30/12 2118 07/31/12 0203 07/31/12 0455  WBC 5.0 5.2 5.1  NEUTROABS 2.8  --   --   HGB 15.2 15.1 14.4  HCT 45.0 45.4 43.0  MCV 92.4 91.5 92.1  PLT 124* 116* 109*   Basic Metabolic Panel  Recent Labs  07/30/12 2118 07/31/12 0203 07/31/12 0455  NA 137  --  139  K 4.5  --  3.9  CL 105  --  109  CO2 27  --  26  GLUCOSE 99  --  83  BUN 34*  --  29*  CREATININE 1.40* 1.30 1.28  CALCIUM 9.0  --  8.6  MG  --  2.1  --    Liver Function Tests  Recent Labs  07/30/12 2118  AST 15  ALT 12  ALKPHOS 36*  BILITOT 0.2*  PROT 5.9*  ALBUMIN 3.5   Cardiac Enzymes  Recent Labs  07/31/12 0202  TROPONINI <0.30   Lab Results  Component Value Date   TSH 1.07 07/24/2012    TELE  Sinus brady  Radiology/Studies  Dg Chest 2 View  07/30/2012   *RADIOLOGY REPORT*  Clinical Data: Weakness.  CHEST - 2 VIEW  Comparison: Chest x-ray of 03/11/2003.  Findings: Lung volumes are normal.  No consolidative airspace disease.  No pleural effusions.  No pneumothorax.  No pulmonary nodule or mass noted.  Pulmonary vasculature and the cardiomediastinal silhouette are within normal limits. Atherosclerosis in the thoracic aorta.  IMPRESSION: 1. No radiographic evidence of acute cardiopulmonary disease. 2.  Atherosclerosis.   Original Report Authenticated By: Trudie Reed, M.D.   Ct Head Wo Contrast  07/30/2012    *RADIOLOGY REPORT*  Clinical Data: Weakness.  CT HEAD WITHOUT CONTRAST  Technique:  Contiguous axial images were obtained from the base of the skull through the vertex without contrast.  Comparison: Head CT 11/10/2011.  Findings: Mild cerebral atrophy.  Patchy and confluent areas of decreased attenuation throughout the deep and periventricular white matter of the cerebral hemispheres bilaterally, similar to the prior study, most compatible with chronic microvascular ischemic disease.  No acute intracranial abnormalities.  Specifically, no evidence of acute intracranial hemorrhage, no definite findings of acute/subacute cerebral ischemia, no mass, mass effect, hydrocephalus or abnormal intra or extra-axial fluid collections. Visualized paranasal sinuses and mastoids are well pneumatized.  No acute displaced skull fractures are identified.  IMPRESSION: 1.  No acute intracranial abnormalities. 2.  Mild cerebral and cerebellar atrophy with chronic microvascular ischemic changes of the cerebral white matter, similar to the prior study.   Original Report Authenticated By: Trudie Reed, M.D.   ASSESSMENT AND PLAN  1.  Bradycardia:  Pt with sinus bradycardia with rates in the 30's to 50's.  He has felt fatigued for the past 2 days.  He has not had chest pain, dyspnea, presyncope, or syncope.  His ECG shows no evidence of high grade heart block.  Troponin is negative.  Echo is pending.  He has a h/o hypothyroidism on replacement, but TSH was nl on 6/9.  EP to see.  Ambulate and schedule ETT for today to assess chronotropic competence.  2.  Fatigue:  Vague Ss.  ? Relationship to bradycardia.  No particularly revealing lab findings.  3.  HTN:  No prior hx - not on any meds.  Initiate low-dose ACEI.  Signed, Nicolasa Ducking NP  ETT to look at chronotropic competence.  EP to see regarding possibility of pacer. Normal TSH on 6/9 Echo for EF  No significant findings on exam  Primary is [employer]

## 2012-08-01 ENCOUNTER — Encounter: Payer: Self-pay | Admitting: Gastroenterology

## 2012-08-01 DIAGNOSIS — I517 Cardiomegaly: Secondary | ICD-10-CM

## 2012-08-01 DIAGNOSIS — K5900 Constipation, unspecified: Secondary | ICD-10-CM

## 2012-08-01 DIAGNOSIS — R262 Difficulty in walking, not elsewhere classified: Secondary | ICD-10-CM

## 2012-08-01 LAB — BASIC METABOLIC PANEL
CO2: 25 mEq/L (ref 19–32)
Chloride: 108 mEq/L (ref 96–112)
Glucose, Bld: 85 mg/dL (ref 70–99)
Potassium: 3.8 mEq/L (ref 3.5–5.1)
Sodium: 140 mEq/L (ref 135–145)

## 2012-08-01 LAB — CK: Total CK: 52 U/L (ref 7–232)

## 2012-08-01 MED ORDER — POLYETHYLENE GLYCOL 3350 17 G PO PACK
17.0000 g | PACK | Freq: Every day | ORAL | Status: DC
  Administered 2012-08-01 – 2012-08-02 (×2): 17 g via ORAL
  Filled 2012-08-01 (×4): qty 1

## 2012-08-01 MED ORDER — SENNOSIDES-DOCUSATE SODIUM 8.6-50 MG PO TABS
2.0000 | ORAL_TABLET | Freq: Two times a day (BID) | ORAL | Status: DC
  Administered 2012-08-01 – 2012-08-03 (×4): 2 via ORAL
  Filled 2012-08-01 (×6): qty 2

## 2012-08-01 MED ORDER — ALPRAZOLAM 0.5 MG PO TABS
0.5000 mg | ORAL_TABLET | Freq: Once | ORAL | Status: DC
  Filled 2012-08-01: qty 1

## 2012-08-01 NOTE — Progress Notes (Signed)
Clinical Social Work Department BRIEF PSYCHOSOCIAL ASSESSMENT 08/01/2012  Patient:  Jose Leach, Jose Leach     Account Number:  [id number]     Admit date:  07/30/2012  Clinical Social Worker:  Robin Searing  Date/Time:  08/01/2012 12:23 PM  Referred by:  Physician  Date Referred:  08/01/2012 Referred for  SNF Placement   Other Referral:   Interview type:  Family Other interview type:   wife at bedside    PSYCHOSOCIAL DATA Living Status:  FAMILY Admitted from facility:   Level of care:   Primary support name:  wife Primary support relationship to patient:  FAMILY Degree of support available:   good    CURRENT CONCERNS Current Concerns  Post-Acute Placement   Other Concerns:    SOCIAL WORK ASSESSMENT / PLAN Spoke to wife who reports that they have resided in Independent Living at [facility] since September of 2013- discusses possible need for SNF at d/c for some rehab and she is open to this if needed- reported today for probable pacemaker-  contacted SNF rep at [facility] to inform her of probable need for SNF rehab bed at d/c/   Assessment/plan status:  Other - See comment Other assessment/ plan:   Fl[place] and [place] to be completed for ?SNF   Information/referral to community resources:   SNF  [facility]  EMS    PATIENT'S/FAMILY'S RESPONSE TO PLAN OF CARE: Wife appreciative of assistance and is open to SNF If needed at d/c- unable to seak with patient about this as the staff were working with him- will f/u with patient and wife-   Jose Leach, MSW, [employer] [phone]

## 2012-08-01 NOTE — Consult Note (Signed)
[facility] Medical Consultation  Jose Leach HQI:[id number] DOB: 1933-01-25 DOA: 07/30/2012 PCP: [name], MD   Requesting physician: [facility] cardiology Date of consultation: 6/17 Reason for consultation: generalized weakness.   Impression/Recommendations Principal Problem:   Symptomatic sinus bradycardia Active Problems:   HYPOTHYROIDISM   HYPERLIPIDEMIA   CAROTID ARTERY DISEASE   Hx of falling    1. Generalized weakness;  - possibly from generalized deconditioning and debility. He has severe peripheral neuropathy, affecting his ambulation and repeat falling. will get a repeat vitamin b12 level, MRI of the head, CK levels and a PT evaluation.   2. Hypothyroidism: TSH within normal limits.   3. CAD and bradycardia: further management as per cardiology  4. Constipation: stool softners ordered.   I will followup again tomorrow. Please contact me if I can be of assistance in the meanwhile. Thank you for this consultation.  Chief Complaint: generalized weakness  HPI:  79 year old gentleman with h/o hypothyroidism, b12 deficiency, anemia, peripheral neuropathy, came in for generalized weakness, unable to ambulate, dizziness  and was found to have bradycardia. Cardiology admitted the patient and we are consulting for generalised weakness, denies chest pain, palipitations, sob, pnd, syncope.   Review of Systems:  The patient denies anorexia, fever, weight loss,, vision loss, decreased hearing, hoarseness, chest pain, syncope, dyspnea on exertion, peripheral edema, balance deficits, hemoptysis, abdominal pain, melena, hematochezia, severe indigestion/heartburn, hematuria, incontinence, genital sores, , suspicious skin lesions, transient blindness, depression, unusual weight change, abnormal bleeding, enlarged lymph nodes, angioedema, and breast masses.    Past Medical History  Diagnosis Date  . Migraines     Dr [name]  . Peripheral neuropathy   . Benign  essential tremor   . Cervical stenosis of spine   . Anemia     B12 deficiency  . Testosterone deficiency     Dr [name], [facility]  . Hypothyroidism   . Hyperlipidemia   . Depression     Dr [name]  . History of shingles 2009  . H/O cardiovascular stress test     a. 02/2003 -> no ischemia/infarct.   Past Surgical History  Procedure Laterality Date  . Appendectomy    . Lumbar laminectomy    . Carotid endarterectomy       L  . Tonsillectomy and adenoidectomy    . Total hip arthroplasty      L  . Cataract extraction Right   . Eye surgery Right 02/2012    retina   Social History:  reports that he quit smoking about 33 years ago. His smoking use included Pipe and Cigars. He has never used smokeless tobacco. He reports that he drinks about 4.2 ounces of alcohol per week. He reports that he does not use illicit drugs.  Allergies  Allergen Reactions  . Latex     REACTION: rash from adhesive  . Tape Rash    Paper tape is ok to use    Family History  Problem Relation Age of Onset  . Stroke Mother 62  . Rectal cancer Father 39  . Hypertension Mother   . Heart disease Father     in 77s  . Diabetes Mother   . Nephritis Sister   . Seizures Brother     died as child    Prior to Admission medications   Medication Sig Start Date End Date Taking? Authorizing Provider  aspirin 81 MG tablet Take 81 mg by mouth daily.   Yes Historical Provider, MD  b complex vitamins capsule  Take 1 capsule by mouth daily.   Yes Historical Provider, MD  bimatoprost (LUMIGAN) 0.01 % SOLN Place 1 drop into both eyes at bedtime.   Yes Historical Provider, MD  buPROPion (WELLBUTRIN XL) 300 MG 24 hr tablet Take 300 mg by mouth daily.   Yes Historical Provider, MD  Cholecalciferol (VITAMIN D3) 2000 UNITS TABS Take 4,000 Units by mouth daily.    Yes Historical Provider, MD  Flaxseed, Linseed, (FLAX SEEDS PO) Take 1 tablet by mouth daily.    Yes Historical Provider, MD  [name] Oil 500 MG CAPS Take 500 mg by  mouth daily.    Yes Historical Provider, MD  L-Methylfolate 7.5 MG TABS Take 1 tablet by mouth daily.   Yes Historical Provider, MD  levothyroxine (SYNTHROID, LEVOTHROID) 50 MCG tablet Take 25-50 mcg by mouth daily before breakfast. 50 mcg daily except on sundays pt takes 25 mcg   Yes Historical Provider, MD  mirtazapine (REMERON) 45 MG tablet Take 45 mg by mouth at bedtime.   Yes Historical Provider, MD  Multiple Vitamins-Minerals (ICAPS PO) Take 1 capsule by mouth 2 (two) times daily.    Yes Historical Provider, MD  nepafenac (NEVANAC) 0.1 % ophthalmic suspension Place 1 drop into the right eye 2 (two) times daily.    Yes Historical Provider, MD  prednisoLONE acetate (PRED FORTE) 1 % ophthalmic suspension Place 1 drop into the right eye 3 (three) times daily.   Yes Historical Provider, MD  sertraline (ZOLOFT) 100 MG tablet Take 200 mg by mouth daily.    Yes Historical Provider, MD  simvastatin (ZOCOR) 40 MG tablet Take 40 mg by mouth at bedtime.   Yes Historical Provider, MD  testosterone ([name]) 50 MG/5GM GEL Place 5 g onto the skin daily.   Yes Historical Provider, MD  topiramate (TOPAMAX) 25 MG tablet Take 25-50 mg by mouth 2 (two) times daily. 25 mg in the a.m. And 50 mg in the p.m.   Yes Historical Provider, MD  vitamin B-12 (CYANOCOBALAMIN) 1000 MCG tablet Take 1,000 mcg by mouth daily.   Yes Historical Provider, MD   Physical Exam: Blood pressure 146/76, pulse 45, temperature 97.6 F (36.4 C), temperature source Oral, resp. rate 18, height 6\' 1"  (1.854 m), weight 69.2 kg (152 lb 8.9 oz), SpO2 97.00%. Filed Vitals:   08/01/12 0529 08/01/12 0550 08/01/12 1009 08/01/12 1350  BP: 155/82  126/63 146/76  Pulse: 46   45  Temp: 98 F (36.7 C)   97.6 F (36.4 C)  TempSrc: Oral   Oral  Resp: 18   18  Height:      Weight:  69.2 kg (152 lb 8.9 oz)    SpO2: 97%   97%    Constitutional: Vital signs reviewed.  Patient is a well-developed and well-nourished  in no acute distress and  cooperative with exam. Alert and oriented x3.  Head: Normocephalic and atraumatic Mouth: no erythema or exudates, MMM Eyes: PERRL, EOMI, conjunctivae normal, No scleral icterus.  Neck: Supple, Trachea midline normal ROM, No JVD, mass, thyromegaly, or carotid bruit present.  Cardiovascular: RRR, S1 normal, S2 normal, no MRG, pulses symmetric and intact bilaterally Pulmonary/Chest: normal respiratory effort, CTAB, no wheezes, rales, or rhonchi Abdominal: Soft. Non-tender, non-distended, bowel sounds are normal, no masses, organomegaly, or guarding present.  Musculoskeletal: No joint deformities, erythema, or stiffness, ROM full and no nontender Hematology: no cervical, inginal, or axillary adenopathy.  Neurological: A&O x3, Strength is normal and symmetric bilaterally, , no focal motor deficit, sensory  intact to light touch bilaterally.   Psychiatric: Normal mood and affect. speech and behavior is normal.    Labs on Admission:  Basic Metabolic Panel:  Recent Labs Lab 07/30/12 2118 07/31/12 0203 07/31/12 0455 08/01/12 0450  NA 137  --  139 140  K 4.5  --  3.9 3.8  CL 105  --  109 108  CO2 27  --  26 25  GLUCOSE 99  --  83 85  BUN 34*  --  29* 27*  CREATININE 1.40* 1.30 1.28 1.32  CALCIUM 9.0  --  8.6 8.2*  MG  --  2.1  --   --    Liver Function Tests:  Recent Labs Lab 07/30/12 2118  AST 15  ALT 12  ALKPHOS 36*  BILITOT 0.2*  PROT 5.9*  ALBUMIN 3.5   No results found for this basename: LIPASE, AMYLASE,  in the last 168 hours No results found for this basename: AMMONIA,  in the last 168 hours CBC:  Recent Labs Lab 07/30/12 2118 07/31/12 0203 07/31/12 0455  WBC 5.0 5.2 5.1  NEUTROABS 2.8  --   --   HGB 15.2 15.1 14.4  HCT 45.0 45.4 43.0  MCV 92.4 91.5 92.1  PLT 124* 116* 109*   Cardiac Enzymes:  Recent Labs Lab 07/31/12 0202 07/31/12 0727  TROPONINI <0.30 <0.30   BNP: No components found with this basename: POCBNP,  CBG: No results found for this  basename: GLUCAP,  in the last 168 hours  Radiological Exams on Admission: Dg Chest 2 View  07/30/2012   *RADIOLOGY REPORT*  Clinical Data: Weakness.  CHEST - 2 VIEW  Comparison: Chest x-ray of 03/11/2003.  Findings: Lung volumes are normal.  No consolidative airspace disease.  No pleural effusions.  No pneumothorax.  No pulmonary nodule or mass noted.  Pulmonary vasculature and the cardiomediastinal silhouette are within normal limits. Atherosclerosis in the thoracic aorta.  IMPRESSION: 1. No radiographic evidence of acute cardiopulmonary disease. 2.  Atherosclerosis.   Original Report Authenticated By: Trudie Reed, M.D.   Ct Head Wo Contrast  07/30/2012   *RADIOLOGY REPORT*  Clinical Data: Weakness.  CT HEAD WITHOUT CONTRAST  Technique:  Contiguous axial images were obtained from the base of the skull through the vertex without contrast.  Comparison: Head CT 11/10/2011.  Findings: Mild cerebral atrophy.  Patchy and confluent areas of decreased attenuation throughout the deep and periventricular white matter of the cerebral hemispheres bilaterally, similar to the prior study, most compatible with chronic microvascular ischemic disease.  No acute intracranial abnormalities.  Specifically, no evidence of acute intracranial hemorrhage, no definite findings of acute/subacute cerebral ischemia, no mass, mass effect, hydrocephalus or abnormal intra or extra-axial fluid collections. Visualized paranasal sinuses and mastoids are well pneumatized.  No acute displaced skull fractures are identified.  IMPRESSION: 1.  No acute intracranial abnormalities. 2.  Mild cerebral and cerebellar atrophy with chronic microvascular ischemic changes of the cerebral white matter, similar to the prior study.   Original Report Authenticated By: Trudie Reed, M.D.    EKG: sinus bradycardia.  Time spent: 70 min  Breah Joa [facility] Pager [phone]  If 7PM-7AM, please contact  night-coverage [email or web address] Password [email or web address] 08/01/2012, 2:43 PM

## 2012-08-01 NOTE — Progress Notes (Signed)
Pt seen today in consultation. The issue is whether the bradycardia is contributing to the abrupt onset of his weakness that occurred this weekend. He has chronic bradycardia with heart rates in the 40s listed a few years ago. His heart rate was in the mid 60s with exertion is noted on the monitor. Is not clear to me that bradycardia is the issue.  I've asked internal medicine to evaluate the patient to assist with the question of his weakness. We'll also get OT PT consultation.

## 2012-08-01 NOTE — Progress Notes (Signed)
Order received for Xanax po to be given to pt while in MRI. Just paged MRI @ [id number] unable to due scan at the moment the department closes at 10 pm. Will follow up in the morning.

## 2012-08-02 ENCOUNTER — Inpatient Hospital Stay (HOSPITAL_COMMUNITY): Payer: Medicare Other

## 2012-08-02 DIAGNOSIS — R001 Bradycardia, unspecified: Secondary | ICD-10-CM

## 2012-08-02 DIAGNOSIS — R531 Weakness: Secondary | ICD-10-CM

## 2012-08-02 DIAGNOSIS — E039 Hypothyroidism, unspecified: Secondary | ICD-10-CM

## 2012-08-02 DIAGNOSIS — R5381 Other malaise: Principal | ICD-10-CM

## 2012-08-02 DIAGNOSIS — R5383 Other fatigue: Principal | ICD-10-CM

## 2012-08-02 HISTORY — DX: Bradycardia, unspecified: R00.1

## 2012-08-02 HISTORY — DX: Weakness: R53.1

## 2012-08-02 LAB — URINALYSIS, ROUTINE W REFLEX MICROSCOPIC
Bilirubin Urine: NEGATIVE
Glucose, UA: NEGATIVE mg/dL
Ketones, ur: NEGATIVE mg/dL
Specific Gravity, Urine: 1.015 (ref 1.005–1.030)
pH: 7.5 (ref 5.0–8.0)

## 2012-08-02 MED ORDER — LORAZEPAM 2 MG/ML IJ SOLN
0.5000 mg | Freq: Three times a day (TID) | INTRAMUSCULAR | Status: DC | PRN
  Administered 2012-08-02 (×2): 0.5 mg via INTRAVENOUS
  Filled 2012-08-02 (×2): qty 1

## 2012-08-02 NOTE — Evaluation (Signed)
Physical Therapy Evaluation Patient Details Name: Jose Leach MRN: [id number] DOB: 1932-12-05 Today's Date: 08/02/2012 Time: 7829-5621 PT Time Calculation (min): 33 min  PT Assessment / Plan / Recommendation Clinical Impression    Pt is a 79 yo male  with PMH of hypothyroidism, dyslipidemia, peripheral neuropathy, anemia, on testosterone patches, depression here with feeling weak for the past two days. PTA, pt lived in independent living at [facility] and was mod I with ADL and mobility.  At this time, pt  Presents with deficits in functional mobility as indicated below and is not safe to return to independent living.  Feel patient will benefit from rehab at [facility] upon discharge.  Will continue to see acutely as indicated.     PT Assessment  Patient needs continued PT services    Follow Up Recommendations  SNF          Equipment Recommendations  None recommended by PT    Recommendations for Other Services     Frequency Min 2X/week    Precautions / Restrictions Precautions Precautions: Fall Restrictions Weight Bearing Restrictions: No   Pertinent Vitals/Pain Pt reports minor pain from IV stick attempts this am; no other pain at this time      Mobility  Bed Mobility Bed Mobility: Not assessed Transfers Transfers: Sit to Stand;Stand to Sit Sit to Stand: With upper extremity assist;From chair/3-in-1;3: Mod assist Stand to Sit: 3: Mod assist;To chair/3-in-1;With upper extremity assist Details for Transfer Assistance: vc for safety and technique - hand placement Ambulation/Gait Ambulation/Gait Assistance: 3: Mod assist Ambulation Distance (Feet): 26 Feet Assistive device: Rolling walker Ambulation/Gait Assistance Details: Very unsteady with ambulation, significant posterior lean and difficulty maintaining balance.  Multiple LOB requiring mod assist to correct  Gait Pattern: Step-through pattern;Decreased stride length;Shuffle;Trunk flexed;Narrow base of  support Gait velocity: decreased General Gait Details: Unsteady with gait Stairs: No    Exercises General Exercises - Lower Extremity Ankle Circles/Pumps: AROM;Both;20 reps Other Exercises Other Exercises: encouraged BUE AROM   PT Diagnosis: Difficulty walking;Abnormality of gait;Generalized weakness  PT Problem List: Decreased strength;Decreased range of motion;Decreased activity tolerance;Decreased balance;Decreased mobility;Decreased coordination PT Treatment Interventions: DME instruction;Gait training;Stair training;Functional mobility training;Therapeutic activities;Therapeutic exercise;Balance training;Patient/family education   PT Goals Acute Rehab PT Goals PT Goal Formulation: With patient Time For Goal Achievement: 08/16/12 Potential to Achieve Goals: Good Pt will go Supine/Side to Sit: with modified independence PT Goal: Supine/Side to Sit - Progress: Goal set today Pt will go Sit to Stand: with modified independence PT Goal: Sit to Stand - Progress: Goal set today Pt will Ambulate: >150 feet;with modified independence PT Goal: Ambulate - Progress: Goal set today  Visit Information  Last PT Received On: 08/02/12 Assistance Needed: +1    Subjective Data  Subjective: ive fallen a bit Patient Stated Goal: to go home   Prior Functioning  Home Living Lives With: Spouse Available Help at Discharge: Available PRN/intermittently Type of Home: Independent living facility (friends home) Home Access: Level entry Home Layout: One level Bathroom Shower/Tub: Health visitor: Handicapped height Home Adaptive Equipment: Environmental consultant - four wheeled Prior Function Level of Independence: Independent with assistive device(s) Able to Take Stairs?: No Driving: No Communication Communication: No difficulties    Cognition  Cognition Arousal/Alertness: Awake/alert Behavior During Therapy: WFL for tasks assessed/performed Overall Cognitive Status: Within Functional  Limits for tasks assessed    Extremity/Trunk Assessment Right Upper Extremity Assessment RUE ROM/Strength/Tone: Deficits (generalized weakness) RUE Sensation: WFL - Light Touch;WFL - Proprioception RUE Coordination: WFL -  gross/fine motor Left Upper Extremity Assessment LUE ROM/Strength/Tone: Deficits (generalized weakness) LUE Sensation: WFL - Light Touch;WFL - Proprioception LUE Coordination: WFL - gross/fine motor Right Lower Extremity Assessment RLE ROM/Strength/Tone: Deficits RLE ROM/Strength/Tone Deficits: generalizeed weakness RLE Sensation: History of peripheral neuropathy Left Lower Extremity Assessment LLE ROM/Strength/Tone: Deficits LLE ROM/Strength/Tone Deficits: generalized weakness LLE Sensation: History of peripheral neuropathy Trunk Assessment Trunk Assessment: Kyphotic   Balance Balance Balance Assessed: Yes Static Standing Balance Static Standing - Balance Support: During functional activity;No upper extremity supported Static Standing - Level of Assistance: 3: Mod assist Dynamic Standing Balance Dynamic Standing - Balance Support: During functional activity Dynamic Standing - Level of Assistance: 3: Mod assist Dynamic Standing - Balance Activities: Other (comment) (losing balance posteriorly)  End of Session PT - End of Session Equipment Utilized During Treatment: Gait belt Activity Tolerance: Patient tolerated treatment well;Patient limited by fatigue Patient left: in chair;with call bell/phone within reach Nurse Communication: Mobility status  GP     Fabio Asa 08/02/2012, 10:12 AM Charlotte Crumb, PT DPT  [phone]

## 2012-08-02 NOTE — Progress Notes (Signed)
Patient Name: Jose Leach Date of Encounter: 08/02/2012   Principal Problem:   Weakness Active Problems:   HYPOTHYROIDISM   HYPERLIPIDEMIA   CAROTID ARTERY DISEASE   Hx of falling   Sinus bradycardia    SUBJECTIVE  No chest pain or sob.  Remains weak.  C/O right sided lower back pain,"like my kidney hurts."  CURRENT MEDS . ALPRAZolam  0.5 mg Oral Once  . aspirin  81 mg Oral Daily  . B-complex with vitamin C  1 tablet Oral Daily  . bimatoprost  1 drop Both Eyes QHS  . buPROPion  300 mg Oral Daily  . captopril  6.25 mg Oral TID  . cholecalciferol  4,000 Units Oral Daily  . folic acid  1 mg Oral Daily  . heparin  5,000 Units Subcutaneous Q8H  . ketorolac  1 drop Right Eye BID  . [START ON 08/06/2012] levothyroxine  25 mcg Oral Custom  . levothyroxine  50 mcg Oral Custom  . omega-3 acid ethyl esters  1 g Oral Daily  . polyethylene glycol  17 g Oral Daily  . prednisoLONE acetate  1 drop Right Eye TID  . senna-docusate  2 tablet Oral BID  . sertraline  200 mg Oral Daily  . simvastatin  40 mg Oral QHS  . testosterone  5 g Transdermal Daily  . topiramate  25 mg Oral Daily  . topiramate  50 mg Oral QHS  . vitamin B-12  1,000 mcg Oral Daily   OBJECTIVE  Filed Vitals:   08/01/12 1009 08/01/12 1350 08/01/12 1958 08/02/12 0559  BP: 126/63 146/76 159/78 141/69  Pulse:  45 52 48  Temp:  97.6 F (36.4 C) 98.8 F (37.1 C) 98.4 F (36.9 C)  TempSrc:  Oral Oral Oral  Resp:  18 18 18   Height:      Weight:      SpO2:  97% 97% 97%    Intake/Output Summary (Last 24 hours) at 08/02/12 0657 Last data filed at 08/02/12 0600  Gross per 24 hour  Intake   1080 ml  Output   2100 ml  Net  -1020 ml   Filed Weights   07/31/12 0119 08/01/12 0550  Weight: 153 lb 8 oz (69.627 kg) 152 lb 8.9 oz (69.2 kg)   PHYSICAL EXAM  General: Pleasant, NAD. Neuro: Alert and oriented X 3. Moves all extremities spontaneously. Psych: Normal affect. HEENT:  Normal  Neck: Supple without  bruits or JVD. Lungs:  Resp regular and unlabored, CTA. Heart: RRR, brady, no s3, s4, or murmurs. Abdomen: Soft, non-tender, non-distended, BS + x 4.  Extremities: No clubbing, cyanosis or edema. DP/PT/Radials 1+ and equal bilaterally.  Accessory Clinical Findings  CBC  Recent Labs  07/30/12 2118 07/31/12 0203 07/31/12 0455  WBC 5.0 5.2 5.1  NEUTROABS 2.8  --   --   HGB 15.2 15.1 14.4  HCT 45.0 45.4 43.0  MCV 92.4 91.5 92.1  PLT 124* 116* 109*   Basic Metabolic Panel  Recent Labs  07/30/12 2118 07/31/12 0203 07/31/12 0455 08/01/12 0450  NA 137  --  139 140  K 4.5  --  3.9 3.8  CL 105  --  109 108  CO2 27  --  26 25  GLUCOSE 99  --  83 85  BUN 34*  --  29* 27*  CREATININE 1.40* 1.30 1.28 1.32  CALCIUM 9.0  --  8.6 8.2*  MG  --  2.1  --   --  Liver Function Tests  Recent Labs  07/30/12 2118  AST 15  ALT 12  ALKPHOS 36*  BILITOT 0.2*  PROT 5.9*  ALBUMIN 3.5   Cardiac Enzymes  Recent Labs  07/31/12 0202 07/31/12 0727 08/01/12 1552  CKTOTAL  --   --  52  TROPONINI <0.30 <0.30  --    Hemoglobin A1C  Recent Labs  07/31/12 0203  HGBA1C 5.3   Thyroid Function Tests  Recent Labs  07/31/12 0203  TSH 1.436    TELE  Sinus brady, 40's to 50's.  ASSESSMENT AND PLAN  1.  Weakness/fatigue:  Likely multifactorial.  Appreciate IM input.  MRI pending.  Doubt bradycardia playing a significant role given its chronicity and normal rise in HR with activity.  PT/OT pending.    2.  Sinus bradycardia:  Stable.  Normal intervals.  No significant pauses.  3.  HTN:  Stable on low-dose acei.  4.  Hypothyroidism:  TSH nl.  5.  R Flank pain:  F/u UA (nl on 6/15).  Signed, Jose Ducking NP   Patient seen and examined independently. Jose Raid, NP note reviewed carefully - agree with his assessment and plan. I have edited the note based on my findings.    Weakness persists. Likely multifactorial. Seen by EP who doesn't feel pacer will help.  Remainder of work-up unrevealing. PT/OT recommending SNF. [facility] can switch to SNF level of care. Will plan d/c in am. D/w patient and family.   Jose Newhouse,MD 2:04 PM

## 2012-08-02 NOTE — Progress Notes (Signed)
Clinical Child psychotherapist (CSW) spoke with Jose Leach at [facility] who confirmed pt will be admitted into the SNF level at discharge. Unit CSW to remain following.  [name], MSW, [name] [phone]

## 2012-08-02 NOTE — Progress Notes (Signed)
INITIAL NUTRITION ASSESSMENT  DOCUMENTATION CODES Per approved criteria  -Not Applicable   INTERVENTION:  No nutrition intervention warranted at this time  RD to continue to follow  NUTRITION DIAGNOSIS: No nutrition diagnosis at this time  Goal: Oral intake with meals to meet >/= 90% of estimated nutrition needs  Monitor:  PO intake, weight, labs, I/O's  Reason for Assessment: Consult  79 y.o. male  Admitting Dx: Weakness  ASSESSMENT: Patient with PMH of hypothyroidism, dyslipidemia, peripheral neuropathy and anemia presented with feeling weak for the past two days.   RD spoke with patient's family who reports patient has been eating well; no recent weight loss; PO intake 100% this AM per flowsheet records; RD feels patient meeting estimated nutrition needs at this time.  Height: Ht Readings from Last 1 Encounters:  07/31/12 6\' 1"  (1.854 m)    Weight: Wt Readings from Last 1 Encounters:  08/01/12 152 lb 8.9 oz (69.2 kg)    Ideal Body Weight: 184 lb  % Ideal Body Weight: 83%  Wt Readings from Last 10 Encounters:  08/01/12 152 lb 8.9 oz (69.2 kg)  07/24/12 158 lb 9.6 oz (71.94 kg)  07/04/12 156 lb (70.761 kg)  11/12/11 156 lb 12.8 oz (71.124 kg)  07/21/11 163 lb (73.936 kg)  04/23/10 163 lb 3.2 oz (74.027 kg)  03/10/10 159 lb 12.8 oz (72.485 kg)  11/12/09 165 lb 9.6 oz (75.116 kg)  04/16/09 165 lb 6.4 oz (75.025 kg)  05/23/08 169 lb 6.4 oz (76.839 kg)    Usual Body Weight: 163 lb  % Usual Body Weight: 93%  BMI:  Body mass index is 20.13 kg/(m^2).  Estimated Nutritional Needs: Kcal: 1700-1900 Protein: 80-90 gm Fluid: 1.7-1.9 L  Skin: Intact  Diet Order: Cardiac  EDUCATION NEEDS: -No education needs identified at this time   Intake/Output Summary (Last 24 hours) at 08/02/12 1052 Last data filed at 08/02/12 0800  Gross per 24 hour  Intake   1080 ml  Output   2100 ml  Net  -1020 ml    Labs:   Recent Labs Lab 07/30/12 2118  07/31/12 0203 07/31/12 0455 08/01/12 0450  NA 137  --  139 140  K 4.5  --  3.9 3.8  CL 105  --  109 108  CO2 27  --  26 25  BUN 34*  --  29* 27*  CREATININE 1.40* 1.30 1.28 1.32  CALCIUM 9.0  --  8.6 8.2*  MG  --  2.1  --   --   GLUCOSE 99  --  83 85    Scheduled Meds: . ALPRAZolam  0.5 mg Oral Once  . aspirin  81 mg Oral Daily  . B-complex with vitamin C  1 tablet Oral Daily  . bimatoprost  1 drop Both Eyes QHS  . buPROPion  300 mg Oral Daily  . captopril  6.25 mg Oral TID  . cholecalciferol  4,000 Units Oral Daily  . folic acid  1 mg Oral Daily  . heparin  5,000 Units Subcutaneous Q8H  . ketorolac  1 drop Right Eye BID  . [START ON 08/06/2012] levothyroxine  25 mcg Oral Custom  . levothyroxine  50 mcg Oral Custom  . omega-3 acid ethyl esters  1 g Oral Daily  . polyethylene glycol  17 g Oral Daily  . prednisoLONE acetate  1 drop Right Eye TID  . senna-docusate  2 tablet Oral BID  . sertraline  200 mg Oral Daily  . simvastatin  40 mg Oral QHS  . testosterone  5 g Transdermal Daily  . topiramate  25 mg Oral Daily  . topiramate  50 mg Oral QHS  . vitamin B-12  1,000 mcg Oral Daily    Continuous Infusions:   Past Medical History  Diagnosis Date  . Migraines     Dr [name]  . Peripheral neuropathy   . Benign essential tremor   . Cervical stenosis of spine   . Anemia     B12 deficiency  . Testosterone deficiency     Dr [name], [facility]  . Hypothyroidism   . Hyperlipidemia   . Depression     Dr [name]  . History of shingles 2009  . H/O cardiovascular stress test     a. 02/2003 -> no ischemia/infarct.    Past Surgical History  Procedure Laterality Date  . Appendectomy    . Lumbar laminectomy    . Carotid endarterectomy       L  . Tonsillectomy and adenoidectomy    . Total hip arthroplasty      L  . Cataract extraction Right   . Eye surgery Right 02/2012    retina    Maureen Chatters, RD, LDN Pager #: [phone] After-Hours Pager #: [phone]

## 2012-08-02 NOTE — Progress Notes (Signed)
[facility] CONSULT/PROGRESS NOTE  Jose Leach:[id number] DOB: 1933/02/10 DOA: 07/30/2012 PCP: [name], MD  Assessment/Plan: #1 weakness Likely multifactorial secondary to deconditioning debility. Patient states has peripheral neuropathy which is being followed by neurology as outpatient which is likely affecting his gait and repeated falling. CT of the head was negative. MRI is pending. TSH within normal limits.total CK of 52. Vitamin B 12 levels of 859. Continue PT OT. Patient likely benefit from a skilled nursing facility.  #2 hypothyroidism TSH is 1.436.continue Synthroid.  #3 bradycardia/coronary artery disease Stable. Per primary team.  #4 hypertension Continue captopril.per primary team.   Code Status: Full Family Communication: admitted patient no family present Disposition Plan: per primary team   Consultants:    Procedures:  MRI pending  2-D echo 08/01/2012  CT head 07/30/2012  Chest x-ray 07/30/2012  Antibiotics:  None  HPI/Subjective: Patient denies any CP or SOB. Generalized weakness. Patient anxious with visible tremors.  Objective: Filed Vitals:   08/01/12 1350 08/01/12 1958 08/02/12 0559 08/02/12 1345  BP: 146/76 159/78 141/69 162/57  Pulse: 45 52 48 57  Temp: 97.6 F (36.4 C) 98.8 F (37.1 C) 98.4 F (36.9 C) 99.3 F (37.4 C)  TempSrc: Oral Oral Oral Oral  Resp: 18 18 18 18   Height:      Weight:      SpO2: 97% 97% 97% 98%    Intake/Output Summary (Last 24 hours) at 08/02/12 1633 Last data filed at 08/02/12 1300  Gross per 24 hour  Intake   1080 ml  Output   1900 ml  Net   -820 ml   Filed Weights   07/31/12 0119 08/01/12 0550  Weight: 69.627 kg (153 lb 8 oz) 69.2 kg (152 lb 8.9 oz)    Exam:   General:  anxious  Cardiovascular: regular rate rhythm  Respiratory: clear to auscultation bilaterally  Abdomen: soft, nontender, nondistended, positive bowel sounds  Extremities no clubbing cyanosis or  edema.   Data Reviewed: Basic Metabolic Panel:  Recent Labs Lab 07/30/12 2118 07/31/12 0203 07/31/12 0455 08/01/12 0450  NA 137  --  139 140  K 4.5  --  3.9 3.8  CL 105  --  109 108  CO2 27  --  26 25  GLUCOSE 99  --  83 85  BUN 34*  --  29* 27*  CREATININE 1.40* 1.30 1.28 1.32  CALCIUM 9.0  --  8.6 8.2*  MG  --  2.1  --   --    Liver Function Tests:  Recent Labs Lab 07/30/12 2118  AST 15  ALT 12  ALKPHOS 36*  BILITOT 0.2*  PROT 5.9*  ALBUMIN 3.5   No results found for this basename: LIPASE, AMYLASE,  in the last 168 hours No results found for this basename: AMMONIA,  in the last 168 hours CBC:  Recent Labs Lab 07/30/12 2118 07/31/12 0203 07/31/12 0455  WBC 5.0 5.2 5.1  NEUTROABS 2.8  --   --   HGB 15.2 15.1 14.4  HCT 45.0 45.4 43.0  MCV 92.4 91.5 92.1  PLT 124* 116* 109*   Cardiac Enzymes:  Recent Labs Lab 07/31/12 0202 07/31/12 0727 08/01/12 1552  CKTOTAL  --   --  52  TROPONINI <0.30 <0.30  --    BNP (last 3 results)  Recent Labs  07/31/12 0202  PROBNP 394.0   CBG: No results found for this basename: GLUCAP,  in the last 168 hours  No results found for this  or any previous visit (from the past 240 hour(s)).   Studies: No results found.  Scheduled Meds: . ALPRAZolam  0.5 mg Oral Once  . aspirin  81 mg Oral Daily  . B-complex with vitamin C  1 tablet Oral Daily  . bimatoprost  1 drop Both Eyes QHS  . buPROPion  300 mg Oral Daily  . captopril  6.25 mg Oral TID  . cholecalciferol  4,000 Units Oral Daily  . folic acid  1 mg Oral Daily  . heparin  5,000 Units Subcutaneous Q8H  . ketorolac  1 drop Right Eye BID  . [START ON 08/06/2012] levothyroxine  25 mcg Oral Custom  . levothyroxine  50 mcg Oral Custom  . omega-3 acid ethyl esters  1 g Oral Daily  . polyethylene glycol  17 g Oral Daily  . prednisoLONE acetate  1 drop Right Eye TID  . senna-docusate  2 tablet Oral BID  . sertraline  200 mg Oral Daily  . simvastatin  40 mg Oral  QHS  . testosterone  5 g Transdermal Daily  . topiramate  25 mg Oral Daily  . topiramate  50 mg Oral QHS  . vitamin B-12  1,000 mcg Oral Daily   Continuous Infusions:   Principal Problem:   Weakness Active Problems:   HYPOTHYROIDISM   HYPERLIPIDEMIA   CAROTID ARTERY DISEASE   Hx of falling   Sinus bradycardia   Bradycardia, sinus    Time spent: > 35 mins    [facility]  [facility] Pager [phone]. If 7PM-7AM, please contact night-coverage at [email or web address], password [facility] 08/02/2012, 4:33 PM  LOS: 3 days

## 2012-08-02 NOTE — Progress Notes (Addendum)
Occupational Therapy Evaluation Patient Details Name: Jose Leach MRN: [id number] DOB: [birth year] Today's Date: 08/02/2012 Time:  - 0915 - 0948 33 min    OT Assessment / Plan / Recommendation Clinical Impression  79 yo man with PMH of hypothyroidism, dyslipidemia, peripheral neuropathy, anemia, on testosterone patches, depression here with feeling weak for the past two days. PTA, pt lived in independent living at [facility] and was mod I with ADL and mobility. Pt is not safe to return to independent living at this time and will benefit from rehab at SNF  To return to PLOF. Pt will benefit from skilled OT services to facilitate D/C to next venue due to below deficits.    OT Assessment  Patient needs continued OT Services    Follow Up Recommendations  SNF    Barriers to Discharge None    Equipment Recommendations  None recommended by OT    Recommendations for Other Services    Frequency  Min 2X/week    Precautions / Restrictions Precautions Precautions: Fall Restrictions Weight Bearing Restrictions: No   Pertinent Vitals/Pain no apparent distress     ADL  Eating/Feeding: Independent Grooming: Min guard Where Assessed - Grooming: Supported standing Upper Body Bathing: Supervision/safety;Set up Where Assessed - Upper Body Bathing: Supported sitting Lower Body Bathing: Minimal assistance Where Assessed - Lower Body Bathing: Supported sit to stand Upper Body Dressing: Supervision/safety;Set up Where Assessed - Upper Body Dressing: Supported sitting Lower Body Dressing: Minimal assistance Where Assessed - Lower Body Dressing: Supported sit to Pharmacist, hospital: Moderate assistance Toilet Transfer Method: Sit to stand Toilet Transfer Equipment: Comfort height toilet;Grab bars Toileting - Clothing Manipulation and Hygiene: Supervision/safety Where Assessed - Toileting Clothing Manipulation and Hygiene: Sit to stand from 3-in-1 or toilet Equipment Used:  Rolling walker;Gait belt Transfers/Ambulation Related to ADLs: mod A ADL Comments: functional decline    OT Diagnosis: Generalized weakness  OT Problem List: Decreased strength;Decreased activity tolerance;Impaired balance (sitting and/or standing);Decreased safety awareness;Decreased knowledge of use of DME or AE;Cardiopulmonary status limiting activity OT Treatment Interventions: Self-care/ADL training;Therapeutic exercise;Energy conservation;DME and/or AE instruction;Therapeutic activities;Patient/family education;Balance training   OT Goals Acute Rehab OT Goals OT Goal Formulation: With patient Time For Goal Achievement: 08/16/12 Potential to Achieve Goals: Good ADL Goals Pt Will Perform Grooming: with supervision;Unsupported;Standing at sink ADL Goal: Grooming - Progress: Goal set today Pt Will Perform Upper Body Bathing: with set-up;Sitting, chair ADL Goal: Upper Body Bathing - Progress: Goal set today Pt Will Perform Lower Body Bathing: with supervision;Sit to stand from chair;Supported ADL Goal: Lower Body Bathing - Progress: Goal set today Pt Will Perform Lower Body Dressing: with set-up;with supervision;Sit to stand from chair;Supported ADL Goal: Lower Body Dressing - Progress: Goal set today Pt Will Transfer to Toilet: with supervision;Ambulation;with DME;3-in-1 ADL Goal: Toilet Transfer - Progress: Goal set today Additional ADL Goal #1: Pt will verbalize 2 E conservation techniques to use during ADL ADL Goal: Additional Goal #1 - Progress: Goal set today Arm Goals Pt Will Complete Theraband Exer: with supervision, verbal cues required/provided;to increase strength;Bilateral upper extremities;2 sets;Level 1 Theraband Arm Goal: Theraband Exercises - Progress: Goal set today  Visit Information  Last OT Received On: 08/02/12 Assistance Needed: +1 PT/OT Co-Evaluation/Treatment: Yes    Subjective Data      Prior Functioning     Home Living Lives With:  Spouse Available Help at Discharge: Available PRN/intermittently Type of Home: Independent living facility ([facility]) Home Access: Level entry Home Layout: One level Bathroom Shower/Tub: Walk-in shower  Bathroom Toilet: Handicapped height Home Adaptive Equipment: Environmental consultant - four wheeled Prior Function Level of Independence: Independent with assistive device(s) Able to Take Stairs?: No Driving: No Communication Communication: No difficulties         Vision/Perception Vision - History Baseline Vision: Wears glasses all the time   Cognition  Cognition Arousal/Alertness: Awake/alert Behavior During Therapy: WFL for tasks assessed/performed Overall Cognitive Status: Within Functional Limits for tasks assessed    Extremity/Trunk Assessment Right Upper Extremity Assessment RUE ROM/Strength/Tone: Deficits (generalized weakness) RUE Sensation: WFL - Light Touch;WFL - Proprioception RUE Coordination: WFL - gross/fine motor Left Upper Extremity Assessment LUE ROM/Strength/Tone: Deficits (generalized weakness) LUE Sensation: WFL - Light Touch;WFL - Proprioception LUE Coordination: WFL - gross/fine motor Right Lower Extremity Assessment RLE ROM/Strength/Tone: Deficits RLE ROM/Strength/Tone Deficits: generalizeed weakness RLE Sensation: History of peripheral neuropathy Left Lower Extremity Assessment LLE ROM/Strength/Tone: Deficits LLE ROM/Strength/Tone Deficits: generalized weakness LLE Sensation: History of peripheral neuropathy Trunk Assessment Trunk Assessment: Kyphotic     Mobility Bed Mobility Bed Mobility: Not assessed Transfers Transfers: Sit to Stand;Stand to Sit Sit to Stand: With upper extremity assist;From chair/3-in-1;3: Mod assist Stand to Sit: 3: Mod assist;To chair/3-in-1;With upper extremity assist Details for Transfer Assistance: vc for safety and technique - hand placement     Exercise Other Exercises Other Exercises: encouraged BUE AROM   Balance  Balance Balance Assessed: Yes Static Standing Balance Static Standing - Balance Support: During functional activity;No upper extremity supported Static Standing - Level of Assistance: 3: Mod assist Dynamic Standing Balance Dynamic Standing - Balance Support: During functional activity Dynamic Standing - Level of Assistance: 3: Mod assist Dynamic Standing - Balance Activities: Other (comment) (losing balance posteriorly)   End of Session OT - End of Session Equipment Utilized During Treatment: Gait belt Activity Tolerance: Patient tolerated treatment well Patient left: in chair;with call bell/phone within reach Nurse Communication: Mobility status  GO     Jason Frisbee,HILLARY 08/02/2012, 9:55 AM Luisa Dago, OTR/L  [phone] 08/02/2012

## 2012-08-03 ENCOUNTER — Encounter: Payer: Medicare Other | Admitting: Gastroenterology

## 2012-08-03 MED ORDER — LISINOPRIL 10 MG PO TABS: 10.0000 mg | ORAL_TABLET | Freq: Every day | ORAL | Status: DC

## 2012-08-03 NOTE — Progress Notes (Signed)
Patient ID: ONEILL BAIS, male   DOB: Dec 18, 1932, 79 y.o.   MRN: [id number]   Patient Name: Jose Leach Date of Encounter: 08/03/2012   Principal Problem:   Weakness Active Problems:   HYPOTHYROIDISM   HYPERLIPIDEMIA   CAROTID ARTERY DISEASE   Hx of falling   Sinus bradycardia   Bradycardia, sinus    SUBJECTIVE  No complaints this am  CURRENT MEDS . ALPRAZolam  0.5 mg Oral Once  . aspirin  81 mg Oral Daily  . B-complex with vitamin C  1 tablet Oral Daily  . bimatoprost  1 drop Both Eyes QHS  . buPROPion  300 mg Oral Daily  . captopril  6.25 mg Oral TID  . cholecalciferol  4,000 Units Oral Daily  . folic acid  1 mg Oral Daily  . heparin  5,000 Units Subcutaneous Q8H  . ketorolac  1 drop Right Eye BID  . [START ON 08/06/2012] levothyroxine  25 mcg Oral Custom  . levothyroxine  50 mcg Oral Custom  . omega-3 acid ethyl esters  1 g Oral Daily  . polyethylene glycol  17 g Oral Daily  . prednisoLONE acetate  1 drop Right Eye TID  . senna-docusate  2 tablet Oral BID  . sertraline  200 mg Oral Daily  . simvastatin  40 mg Oral QHS  . testosterone  5 g Transdermal Daily  . topiramate  25 mg Oral Daily  . topiramate  50 mg Oral QHS  . vitamin B-12  1,000 mcg Oral Daily   OBJECTIVE  Filed Vitals:   08/02/12 0559 08/02/12 1345 08/02/12 2047 08/03/12 0500  BP: 141/69 162/57 145/83 149/82  Pulse: 48 57 61 55  Temp: 98.4 F (36.9 C) 99.3 F (37.4 C) 98.7 F (37.1 C) 98.7 F (37.1 C)  TempSrc: Oral Oral Oral Oral  Resp: 18 18 20 18   Height:      Weight:      SpO2: 97% 98% 96% 93%    Intake/Output Summary (Last 24 hours) at 08/03/12 0742 Last data filed at 08/02/12 1700  Gross per 24 hour  Intake    480 ml  Output    300 ml  Net    180 ml   Filed Weights   07/31/12 0119 08/01/12 0550  Weight: 153 lb 8 oz (69.627 kg) 152 lb 8.9 oz (69.2 kg)   PHYSICAL EXAM  General: Pleasant, NAD. Neuro: Alert and oriented X 3. Moves all extremities  spontaneously. Psych: Normal affect. HEENT:  Normal  Neck: Supple without bruits or JVD. Lungs:  Resp regular and unlabored, CTA. Heart: RRR, brady, no s3, s4, or murmurs. Abdomen: Soft, non-tender, non-distended, BS + x 4.  Extremities: No clubbing, cyanosis or edema. DP/PT/Radials 1+ and equal bilaterally.  Accessory Clinical Findings  CBC No results found for this basename: WBC, NEUTROABS, HGB, HCT, MCV, PLT,  in the last 72 hours Basic Metabolic Panel  Recent Labs  08/01/12 0450  NA 140  K 3.8  CL 108  CO2 25  GLUCOSE 85  BUN 27*  CREATININE 1.32  CALCIUM 8.2*   Liver Function Tests No results found for this basename: AST, ALT, ALKPHOS, BILITOT, PROT, ALBUMIN,  in the last 72 hours Cardiac Enzymes  Recent Labs  08/01/12 1552  CKTOTAL 52    TELE  Sinus brady, 40's to 50's.  ASSESSMENT AND PLAN  1.  Weakness/fatigue:  Likely multifactorial.  Appreciate IM input.   Doubt bradycardia playing a significant role given its chronicity  and normal rise in HR with activity.  PT/OT pending.    2.  Sinus bradycardia:  Stable.  Normal intervals.  No significant pauses.  3.  HTN:  Stable on low-dose acei.  4.  Hypothyroidism:  TSH nl.  D/C to [facility] today  Signed, Charlton Haws NP

## 2012-08-03 NOTE — Progress Notes (Signed)
Patient for d/c back to [facility]- spoke with wife at bedside and she is agreeable to these plans- await callback from SNF to confirm bed is ready- will then plan transport via EMS per wife's request.    [name], MSW, [name] [phone]

## 2012-08-03 NOTE — Discharge Summary (Signed)
Patient ID: Jose Leach,  MRN: [id number], DOB/AGE: November 28, 1932 79 y.o.  Admit date: 07/30/2012 Discharge date: 08/03/2012  Primary Care Provider: [name] Primary Cardiologist: Judie Petit. Excell Seltzer, MD   Discharge Diagnoses Principal Problem:   Weakness Active Problems:   Sinus bradycardia   HYPOTHYROIDISM   HYPERLIPIDEMIA   CAROTID ARTERY DISEASE   Hx of falling  Allergies Allergies  Allergen Reactions  . Latex     REACTION: rash from adhesive  . Tape Rash    Paper tape is ok to use     Procedures  CT Head w/o Contrast 6.15.2014  IMPRESSION: 1. No acute intracranial abnormalities. 2. Mild cerebral and cerebellar atrophy with chronic microvascular ischemic changes of the cerebral white matter, similar to the prior study. _____________   2D Echocardiogram 6.17.2014  Study Conclusions  Left ventricle: The cavity size was normal. There was mild concentric hypertrophy. Systolic function was normal. The estimated ejection fraction was in the range of 55% to 60%. Wall motion was normal; there were no regional wall motion abnormalities.  _____________  MRI Brain w/o Contrast 6.18.2014  IMPRESSION: Motion degraded exam. No acute infarct. Moderate small vessel disease type changes. Global atrophy. _____________   History of Present Illness  79 year old male with the above problem list. He presented to the [place] on June 15 secondary to a two-day history of weakness and fatigue. In that setting, he has experienced mechanical falls without significant injury. He lives in an assisted living and uses a walker. He has had issues with ambulation secondary to peripheral neuropathy. In the ED, he was found to be bradycardic, in sinus rhythm, with rates in the 40s. He had normal intervals and no acute ST or T changes. He was admitted for further evaluation.  Hospital Course  Following admission, patient's heart rate and rhythm remained stable. With ambulation, his heart  rate had a normal rise showing chronotropic competence. Further, past ECGs were reviewed and patient has a long history of sinus bradycardia. He was seen by electrophysiology who did not feel the patient required permanent pacing at this time. We had initially scheduled him for an exercise treadmill test to evaluate heart rate and rule out ischemia however patient was barely able to transfer from his bed to the wheelchair.    Despite stability of heart rate and rhythm, patient continued to complain of weakness and fatigue. Head CT, brain MRI, and echocardiogram were all normal as was TSH, B12, urinalysis, and CK.  Physical and occupational therapy were consulted and felt that patient would benefit from skilled nursing facility placement.  [facility]  Discharge Vitals Blood pressure 149/82, pulse 55, temperature 98.7 F (37.1 C), temperature source Oral, resp. rate 18, height 6\' 1"  (1.854 m), weight 152 lb 8.9 oz (69.2 kg), SpO2 93.00%.  Filed Weights   07/31/12 0119 08/01/12 0550  Weight: 153 lb 8 oz (69.627 kg) 152 lb 8.9 oz (69.2 kg)   Labs  CBC Lab Results  Component Value Date   WBC 5.1 07/31/2012   HGB 14.4 07/31/2012   HCT 43.0 07/31/2012   MCV 92.1 07/31/2012   PLT 109* 07/31/2012   Basic Metabolic Panel  Recent Labs  08/01/12 0450  NA 140  K 3.8  CL 108  CO2 25  GLUCOSE 85  BUN 27*  CREATININE 1.32  CALCIUM 8.2*   Liver Function Tests Lab Results  Component Value Date   ALT 12 07/30/2012   AST 15 07/30/2012  ALKPHOS 36* 07/30/2012   BILITOT 0.2* 07/30/2012   Cardiac Enzymes  Recent Labs  08/01/12 1552  CKTOTAL 52   Thyroid Function Tests Lab Results  Component Value Date   TSH 1.436 07/31/2012   Disposition  Pt is being discharged home today in good condition.  Follow-up Plans & Appointments  Follow-up Information   Follow up with Tereso Newcomer, PA-C On 08/31/2012. (10:10 PM - Dr. Earmon Phoenix PA)     Contact information:   [address] [place] [address] [phone]       Discharge Medications    Medication List    TAKE these medications       aspirin 81 MG tablet  Take 81 mg by mouth daily.     b complex vitamins capsule  Take 1 capsule by mouth daily.     bimatoprost 0.01 % Soln  Commonly known as:  LUMIGAN  Place 1 drop into both eyes at bedtime.     FLAX SEEDS PO  Take 1 tablet by mouth daily.     ICAPS PO  Take 1 capsule by mouth 2 (two) times daily.     Krill Oil 500 MG Caps  Take 500 mg by mouth daily.     L-Methylfolate 7.5 MG Tabs  Take 1 tablet by mouth daily.     levothyroxine 50 MCG tablet  Commonly known as:  SYNTHROID, LEVOTHROID  Take 25-50 mcg by mouth daily before breakfast. 50 mcg daily except on sundays pt takes 25 mcg     lisinopril 10 MG tablet  Commonly known as:  PRINIVIL  Take 1 tablet (10 mg total) by mouth daily.     mirtazapine 45 MG tablet  Commonly known as:  REMERON  Take 45 mg by mouth at bedtime.     nepafenac 0.1 % ophthalmic suspension  Commonly known as:  NEVANAC  Place 1 drop into the right eye 2 (two) times daily.     prednisoLONE acetate 1 % ophthalmic suspension  Commonly known as:  PRED FORTE  Place 1 drop into the right eye 3 (three) times daily.     sertraline 100 MG tablet  Commonly known as:  ZOLOFT  Take 200 mg by mouth daily.     simvastatin 40 MG tablet  Commonly known as:  ZOCOR  Take 40 mg by mouth at bedtime.     [name] 50 MG/5GM Gel  Generic drug:  testosterone  Place 5 g onto the skin daily.     topiramate 25 MG tablet  Commonly known as:  TOPAMAX  Take 25-50 mg by mouth 2 (two) times daily. 25 mg in the a.m. And 50 mg in the p.m.     vitamin B-12 1000 MCG tablet  Commonly known as:  CYANOCOBALAMIN  Take 1,000 mcg by mouth daily.     Vitamin D3 2000 UNITS Tabs  Take 4,000 Units by mouth daily.     WELLBUTRIN XL 300 MG 24 hr tablet  Generic drug:  buPROPion    Take 300 mg by mouth daily.        Outstanding Labs/Studies  None  Duration of Discharge Encounter   Greater than 30 minutes including physician time.  Signed, Nicolasa Ducking NP 08/03/2012, 9:13 AM

## 2012-08-03 NOTE — Progress Notes (Signed)
Discharged to home with family office visits in place teaching done  

## 2012-08-03 NOTE — Care Management Note (Signed)
    Page 1 of 1   08/03/2012     2:41:01 PM   CARE MANAGEMENT NOTE 08/03/2012  Patient:  Jose Leach, Jose Leach   Account Number:  [id number]  Date Initiated:  08/01/2012  Documentation initiated by:  Aideen Fenster  Subjective/Objective Assessment:   PT ADM ON 07/30/12 WITH SYMPTOMATIC BRADYCARDIA.  PTA, PT LIVES AT [facility] WITH WIFE.     Action/Plan:   PT DECONDITIONED AND WILL NEED SNF LEVEL OF CARE AT DC. CSW CONSULTED TO FACILITATE DC TO [facility] WHEN MEDICALLY STABLE.   Anticipated DC Date:  08/03/2012   Anticipated DC Plan:  SKILLED NURSING FACILITY  In-house referral  Clinical Social Worker      DC Planning Services  CM consult      Choice offered to / List presented to:             Status of service:  Completed, signed off Medicare Important Message given?   (If response is "NO", the following Medicare IM given date fields will be blank) Date Medicare IM given:   Date Additional Medicare IM given:    Discharge Disposition:  SKILLED NURSING FACILITY  Per UR Regulation:  Reviewed for med. necessity/level of care/duration of stay  If discussed at Long Length of Stay Meetings, dates discussed:    Comments:

## 2012-08-06 ENCOUNTER — Other Ambulatory Visit: Payer: Self-pay | Admitting: Internal Medicine

## 2012-08-06 DIAGNOSIS — E559 Vitamin D deficiency, unspecified: Secondary | ICD-10-CM

## 2012-08-06 HISTORY — DX: Vitamin D deficiency, unspecified: E55.9

## 2012-08-06 LAB — VITAMIN D 1,25 DIHYDROXY
Vitamin D 1, 25 (OH)2 Total: 31 pg/mL (ref 18–72)
Vitamin D2 1, 25 (OH)2: <8 pg/mL
Vitamin D3 1, 25 (OH)2: 31 pg/mL

## 2012-08-07 ENCOUNTER — Encounter: Payer: Self-pay | Admitting: *Deleted

## 2012-08-15 ENCOUNTER — Non-Acute Institutional Stay (SKILLED_NURSING_FACILITY): Payer: Medicare Other | Admitting: Nurse Practitioner

## 2012-08-15 DIAGNOSIS — E782 Mixed hyperlipidemia: Secondary | ICD-10-CM

## 2012-08-15 DIAGNOSIS — G43909 Migraine, unspecified, not intractable, without status migrainosus: Secondary | ICD-10-CM

## 2012-08-15 DIAGNOSIS — F411 Generalized anxiety disorder: Secondary | ICD-10-CM

## 2012-08-15 DIAGNOSIS — E785 Hyperlipidemia, unspecified: Secondary | ICD-10-CM

## 2012-08-15 DIAGNOSIS — I10 Essential (primary) hypertension: Secondary | ICD-10-CM

## 2012-08-15 DIAGNOSIS — R5383 Other fatigue: Secondary | ICD-10-CM

## 2012-08-15 DIAGNOSIS — E039 Hypothyroidism, unspecified: Secondary | ICD-10-CM

## 2012-08-15 DIAGNOSIS — R531 Weakness: Secondary | ICD-10-CM

## 2012-08-15 DIAGNOSIS — I498 Other specified cardiac arrhythmias: Secondary | ICD-10-CM

## 2012-08-15 DIAGNOSIS — R001 Bradycardia, unspecified: Secondary | ICD-10-CM

## 2012-08-22 ENCOUNTER — Encounter: Payer: Self-pay | Admitting: Nurse Practitioner

## 2012-08-22 DIAGNOSIS — I10 Essential (primary) hypertension: Secondary | ICD-10-CM | POA: Insufficient documentation

## 2012-08-22 HISTORY — DX: Essential (primary) hypertension: I10

## 2012-08-22 NOTE — Assessment & Plan Note (Signed)
Takes Topamax 25mg  am and 50mg  pm. Stable.

## 2012-08-22 NOTE — Assessment & Plan Note (Signed)
Takes Simvastatin 40mg  daily.

## 2012-08-22 NOTE — Assessment & Plan Note (Signed)
Hospitalized 08/02/12-08/03/12. The  patient presented to ED with generalized weakness/falling  for 2 days and was found to have HR in 40s. Hx of gait disorder related to his peripheral neuropathy and he ambulates with walker at home [place] @ [facility].  He had Cardiology consultation Nicolasa Ducking NP: sinus bradycardia with HR 40s at rest when he presented to ED--a long hx of sinus bradycardia, his heart rate had a normal rise showing chronotropic competence. Electrophysiology didn't feel the patient required permanent pacing. CT head, MRI brain, echocardiogram, TSH, B12 were all unremarkable while in hospital. The patient has been in SNF @ [facility] since 08/03/12. His weakness and fatigue have been improved and he is ready to be discharged home [place] at [facility] with in home PT/OT.

## 2012-08-22 NOTE — Assessment & Plan Note (Signed)
Takes Simvastatin 40mg 

## 2012-08-22 NOTE — Progress Notes (Signed)
Patient ID: Jose Leach, male   DOB: August 21, 1932, 79 y.o.   MRN: [id number] Code Status: Full Code  Allergies  Allergen Reactions  . Latex     REACTION: rash from adhesive  . Tape Rash    Paper tape is ok to use     Chief Complaint  Patient presents with  . Medical Managment of Chronic Issues  . Discharge Note    HPI: Patient is a 79 y.o. male seen in the SNF at [facility]  today for management of chronic illness and planing of discharging  Problem List Items Addressed This Visit   ANXIETY     Apparently stable on Zoloft 200mg , Wellbutrin 300mg , and Remeron 45mg     HTN (hypertension)     Controlled on Lisinopril 10mg  daily.     HYPERLIPIDEMIA     Takes Simvastatin 40mg  daily.     Hyperlipidemia     Takes Simvastatin 40mg     Hypothyroidism     Takes Levothyroxine daily except Sunday . Last TSH wnl while in hospital.     MIGRAINE HEADACHE     Takes Topamax 25mg  am and 50mg  pm. Stable.     RESOLVED: Sinus bradycardia     HR in 60s today.    Weakness - Primary     Hospitalized 08/02/12-08/03/12. The  patient presented to ED with generalized weakness/falling  for 2 days and was found to have HR in 40s. Hx of gait disorder related to his peripheral neuropathy and he ambulates with walker at home [place] @ [facility]  He had Cardiology consultation Nicolasa Ducking NP: sinus bradycardia with HR 40s at rest when he presented to ED--a long hx of sinus bradycardia, his heart rate had a normal rise showing chronotropic competence. Electrophysiology didn't feel the patient required permanent pacing. CT head, MRI brain, echocardiogram, TSH, B12 were all unremarkable while in hospital. The patient has been in SNF @ [facility] since 08/03/12. His weakness and fatigue have been improved and he is ready to be discharged home [place] at [facility] with in home PT/OT.        Review of Systems:  Review of Systems  Constitutional: Positive for malaise/fatigue. Negative for fever, chills, weight  loss and diaphoresis.  HENT: Positive for hearing loss and neck pain. Negative for ear pain, nosebleeds, congestion, sore throat, tinnitus and ear discharge.   Eyes: Negative for blurred vision, double vision, photophobia, pain, discharge and redness.  Respiratory: Negative for cough, hemoptysis, sputum production, shortness of breath, wheezing and stridor.   Cardiovascular: Positive for PND. Negative for chest pain, palpitations, orthopnea, claudication and leg swelling.  Gastrointestinal: Negative for heartburn, nausea, vomiting, abdominal pain, diarrhea, constipation, blood in stool and melena.  Genitourinary: Negative for dysuria, urgency, frequency, hematuria and flank pain.  Musculoskeletal: Positive for back pain, joint pain and falls. Negative for myalgias.  Skin: Negative for itching and rash.  Neurological: Positive for sensory change, speech change (low voice), weakness and headaches (treated). Negative for dizziness, tingling, tremors, focal weakness, seizures and loss of consciousness.  Endo/Heme/Allergies: Negative for environmental allergies and polydipsia. Does not bruise/bleed easily.  Psychiatric/Behavioral: Positive for depression and memory loss. Negative for suicidal ideas, hallucinations and substance abuse. The patient is nervous/anxious. The patient does not have insomnia.      Past Medical History  Diagnosis Date  . Migraines     Dr [name]  . Peripheral neuropathy   . Benign essential tremor   . Cervical stenosis of spine   .  Anemia     B12 deficiency  . Testosterone deficiency     Dr [name], [facility]  . Hypothyroidism   . Hyperlipidemia   . Depression     Dr [name]  . History of shingles 2009  . H/O cardiovascular stress test     a. 02/2003 -> no ischemia/infarct.   Past Surgical History  Procedure Laterality Date  . Appendectomy    . Lumbar laminectomy    . Carotid endarterectomy       L  . Tonsillectomy and adenoidectomy    . Total hip arthroplasty       L  . Cataract extraction Right   . Eye surgery Right 02/2012    retina   Social History:   reports that he quit smoking about 33 years ago. His smoking use included Pipe and Cigars. He has never used smokeless tobacco. He reports that he drinks about 4.2 ounces of alcohol per week. He reports that he does not use illicit drugs.  Family History  Problem Relation Age of Onset  . Stroke Mother 18  . Rectal cancer Father 41  . Hypertension Mother   . Heart disease Father     in 42s  . Diabetes Mother   . Nephritis Sister   . Seizures Brother     died as child    Medications: Patient's Medications  New Prescriptions   No medications on file  Previous Medications   ASPIRIN 81 MG TABLET    Take 81 mg by mouth daily.   B COMPLEX VITAMINS CAPSULE    Take 1 capsule by mouth daily.   BIMATOPROST (LUMIGAN) 0.01 % SOLN    Place 1 drop into both eyes at bedtime.   BUPROPION (WELLBUTRIN XL) 300 MG 24 HR TABLET    Take 300 mg by mouth daily.   CHOLECALCIFEROL (VITAMIN D3) 2000 UNITS TABS    Take 4,000 Units by mouth daily.    FLAXSEED, LINSEED, (FLAX SEEDS PO)    Take 1 tablet by mouth daily.    KRILL OIL 500 MG CAPS    Take 500 mg by mouth daily.    L-METHYLFOLATE 7.5 MG TABS    Take 1 tablet by mouth daily.   LEVOTHYROXINE (SYNTHROID, LEVOTHROID) 50 MCG TABLET    Take 25-50 mcg by mouth daily before breakfast. 50 mcg daily except on sundays pt takes 25 mcg   LISINOPRIL (PRINIVIL) 10 MG TABLET    Take 1 tablet (10 mg total) by mouth daily.   MIRTAZAPINE (REMERON) 45 MG TABLET    Take 45 mg by mouth at bedtime.   MULTIPLE VITAMINS-MINERALS (ICAPS PO)    Take 1 capsule by mouth 2 (two) times daily.    NEPAFENAC (NEVANAC) 0.1 % OPHTHALMIC SUSPENSION    Place 1 drop into the right eye 2 (two) times daily.    PREDNISOLONE ACETATE (PRED FORTE) 1 % OPHTHALMIC SUSPENSION    Place 1 drop into the right eye 3 (three) times daily.   SERTRALINE (ZOLOFT) 100 MG TABLET    Take 200 mg by mouth daily.     SIMVASTATIN (ZOCOR) 40 MG TABLET    Take 40 mg by mouth at bedtime.   TESTOSTERONE ([name]) 50 MG/5GM GEL    Place 5 g onto the skin daily.   TOPIRAMATE (TOPAMAX) 25 MG TABLET    Take 25-50 mg by mouth 2 (two) times daily. 25 mg in the a.m. And 50 mg in the p.m.   VITAMIN B-12 (CYANOCOBALAMIN) 1000  MCG TABLET    Take 1,000 mcg by mouth daily.  Modified Medications   No medications on file  Discontinued Medications   No medications on file     Physical Exam: Physical Exam  Constitutional: He is oriented to person, place, and time. He appears well-developed and well-nourished. No distress.  HENT:  Head: Normocephalic and atraumatic.  Right Ear: External ear normal.  Left Ear: External ear normal.  Nose: Nose normal.  Mouth/Throat: Oropharynx is clear and moist. No oropharyngeal exudate.  Eyes: Conjunctivae and EOM are normal. Pupils are equal, round, and reactive to light. Right eye exhibits no discharge. Left eye exhibits no discharge. No scleral icterus.  Neck: Normal range of motion. Neck supple. No JVD present. No tracheal deviation present. No thyromegaly present.  Cardiovascular: Normal rate, regular rhythm, normal heart sounds and intact distal pulses.   No murmur heard. Pulmonary/Chest: Effort normal and breath sounds normal. No stridor. No respiratory distress. He has no wheezes. He has no rales. He exhibits no tenderness.  Abdominal: Soft. Bowel sounds are normal. He exhibits no distension. There is no tenderness. There is no rebound and no guarding.  Musculoskeletal: Normal range of motion. He exhibits no edema and no tenderness.  Lymphadenopathy:    He has no cervical adenopathy.  Neurological: He is alert and oriented to person, place, and time. He has normal reflexes. He displays normal reflexes. No cranial nerve deficit. He exhibits normal muscle tone. Coordination normal.  Skin: Skin is warm and dry. No rash noted. He is not diaphoretic. No erythema. No pallor.   Psychiatric: His mood appears anxious. His affect is not angry, not blunt, not labile and not inappropriate. His speech is not rapid and/or pressured, not delayed, not tangential and not slurred. He is not agitated, not aggressive, not hyperactive, not slowed, not withdrawn, not actively hallucinating and not combative. Thought content is not paranoid and not delusional. Cognition and memory are impaired. He does not express impulsivity or inappropriate judgment. He exhibits a depressed mood. He expresses no homicidal and no suicidal ideation. He expresses no suicidal plans and no homicidal plans. He is communicative. He exhibits abnormal recent memory. He exhibits normal remote memory. He is attentive.    Filed Vitals:   08/22/12 1157  BP: 130/74  Pulse: 64  Temp: 98 F (36.7 C)  TempSrc: Tympanic  Resp: 16  Weight: 149 lb (67.586 kg)  SpO2: 99%      Labs reviewed: Basic Metabolic Panel:  Recent Labs  16/10/96 1527 07/30/12 2118 07/31/12 0203 07/31/12 0455 08/01/12 0450  NA  --  137  --  139 140  K  --  4.5  --  3.9 3.8  CL  --  105  --  109 108  CO2  --  27  --  26 25  GLUCOSE  --  99  --  83 85  BUN  --  34*  --  29* 27*  CREATININE  --  1.40* 1.30 1.28 1.32  CALCIUM  --  9.0  --  8.6 8.2*  MG  --   --  2.1  --   --   TSH 1.07  --  1.436  --   --    Liver Function Tests:  Recent Labs  07/30/12 2118  AST 15  ALT 12  ALKPHOS 36*  BILITOT 0.2*  PROT 5.9*  ALBUMIN 3.5   No results found for this basename: LIPASE, AMYLASE,  in the last 8760 hours No results  found for this basename: AMMONIA,  in the last 8760 hours CBC:  Recent Labs  07/30/12 2118 07/31/12 0203 07/31/12 0455  WBC 5.0 5.2 5.1  NEUTROABS 2.8  --   --   HGB 15.2 15.1 14.4  HCT 45.0 45.4 43.0  MCV 92.4 91.5 92.1  PLT 124* 116* 109*    Recent Labs  08/02/12 0425  VITAMINB12 859    Past Procedures:  08/02/12 2D echocardiogram:  LV EF: 55% -   60%  07/30/12 CT  head  IMPRESSION: 1.  No acute intracranial abnormalities. 2.  Mild cerebral and cerebellar atrophy with chronic microvascular ischemic changes of the cerebral white matter, similar to the prior Study.  08/01/12 MRI brain:  IMPRESSION: Motion degraded exam.   No acute infarct.   Moderate small vessel disease type changes.   Global atrophy.     Assessment/Plan Weakness Hospitalized 08/02/12-08/03/12. The  patient presented to ED with generalized weakness/falling  for 2 days and was found to have HR in 40s. Hx of gait disorder related to his peripheral neuropathy and he ambulates with walker at home [place] @ [facility].  He had Cardiology consultation Nicolasa Ducking NP: sinus bradycardia with HR 40s at rest when he presented to ED--a long hx of sinus bradycardia, his heart rate had a normal rise showing chronotropic competence. Electrophysiology didn't feel the patient required permanent pacing. CT head, MRI brain, echocardiogram, TSH, B12 were all unremarkable while in hospital. The patient has been in SNF @ [facility] since 08/03/12. His weakness and fatigue have been improved and he is ready to be discharged home [place] at [facility] with in home PT/OT.   Sinus bradycardia HR in 60s today.  Hypothyroidism Takes Levothyroxine daily except Sunday . Last TSH wnl while in hospital.   Hyperlipidemia Takes Simvastatin 40mg   MIGRAINE HEADACHE Takes Topamax 25mg  am and 50mg  pm. Stable.   HYPERLIPIDEMIA Takes Simvastatin 40mg  daily.   ANXIETY Apparently stable on Zoloft 200mg , Wellbutrin 300mg , and Remeron 45mg   HTN (hypertension) Controlled on Lisinopril 10mg  daily.     Family/ Staff Communication: discharge home with in home PT/OT  Goals of Care: IL  Labs/tests ordered: none.

## 2012-08-22 NOTE — Assessment & Plan Note (Signed)
Controlled on Lisinopril 10mg  daily.

## 2012-08-22 NOTE — Assessment & Plan Note (Signed)
Apparently stable on Zoloft 200mg , Wellbutrin 300mg , and Remeron 45mg 

## 2012-08-22 NOTE — Assessment & Plan Note (Signed)
HR in 60s today.

## 2012-08-22 NOTE — Assessment & Plan Note (Signed)
Takes Levothyroxine 50mcg daily except Sunday 25mcg. Last TSH wnl while in hospital.    

## 2012-08-28 ENCOUNTER — Other Ambulatory Visit: Payer: Self-pay | Admitting: Internal Medicine

## 2012-08-29 NOTE — Telephone Encounter (Signed)
I called the pharmacy, spoke with Lorin Picket, patient is now established under Dr.Green

## 2012-08-31 ENCOUNTER — Encounter: Payer: Medicare Other | Admitting: Physician Assistant

## 2012-09-04 ENCOUNTER — Encounter: Payer: Medicare Other | Admitting: Physician Assistant

## 2012-09-05 ENCOUNTER — Other Ambulatory Visit: Payer: Self-pay | Admitting: Internal Medicine

## 2012-09-05 NOTE — Telephone Encounter (Signed)
Lipid/Hep 272.4/995.20  

## 2012-09-07 ENCOUNTER — Ambulatory Visit (HOSPITAL_COMMUNITY)
Admission: RE | Admit: 2012-09-07 | Discharge: 2012-09-07 | Disposition: A | Discharge status: Home / Self Care | Payer: Medicare Other | Source: Ambulatory Visit | Attending: Cardiovascular Disease | Admitting: Cardiovascular Disease

## 2012-09-07 ENCOUNTER — Other Ambulatory Visit (HOSPITAL_COMMUNITY): Payer: Self-pay | Admitting: Orthopedic Surgery

## 2012-09-07 DIAGNOSIS — M79609 Pain in unspecified limb: Secondary | ICD-10-CM

## 2012-09-07 DIAGNOSIS — M7989 Other specified soft tissue disorders: Secondary | ICD-10-CM | POA: Insufficient documentation

## 2012-09-07 NOTE — Progress Notes (Signed)
Left Lower Extremity Venous Duplex Completed. °Jose Leach ° °

## 2012-09-08 ENCOUNTER — Other Ambulatory Visit (INDEPENDENT_AMBULATORY_CARE_PROVIDER_SITE_OTHER): Payer: Medicare Other

## 2012-09-08 DIAGNOSIS — E785 Hyperlipidemia, unspecified: Secondary | ICD-10-CM

## 2012-09-08 DIAGNOSIS — E039 Hypothyroidism, unspecified: Secondary | ICD-10-CM

## 2012-09-08 DIAGNOSIS — E559 Vitamin D deficiency, unspecified: Secondary | ICD-10-CM

## 2012-09-08 DIAGNOSIS — T887XXA Unspecified adverse effect of drug or medicament, initial encounter: Secondary | ICD-10-CM

## 2012-09-08 LAB — LIPID PANEL
HDL: 46.9 mg/dL (ref >39.00)
Triglycerides: 98 mg/dL (ref 0.0–149.0)
VLDL: 19.6 mg/dL (ref 0.0–40.0)

## 2012-09-08 LAB — HEPATIC FUNCTION PANEL
Bilirubin, Direct: 0.1 mg/dL (ref 0.0–0.3)
Total Bilirubin: 0.9 mg/dL (ref 0.3–1.2)
Total Protein: 6.3 g/dL (ref 6.0–8.3)

## 2012-09-08 LAB — LDL CHOLESTEROL, DIRECT: Direct LDL: 132.5 mg/dL

## 2012-09-08 LAB — TSH: TSH: 2.59 u[IU]/mL (ref 0.35–5.50)

## 2012-09-13 ENCOUNTER — Encounter: Payer: Self-pay | Admitting: Internal Medicine

## 2012-09-13 ENCOUNTER — Ambulatory Visit (INDEPENDENT_AMBULATORY_CARE_PROVIDER_SITE_OTHER): Payer: Medicare Other | Admitting: Internal Medicine

## 2012-09-13 ENCOUNTER — Other Ambulatory Visit: Payer: Self-pay | Admitting: Internal Medicine

## 2012-09-13 VITALS — BP 118/70 | HR 49 | Temp 98.1°F | Wt 158.0 lb

## 2012-09-13 DIAGNOSIS — E559 Vitamin D deficiency, unspecified: Secondary | ICD-10-CM

## 2012-09-13 DIAGNOSIS — R600 Localized edema: Secondary | ICD-10-CM

## 2012-09-13 DIAGNOSIS — R7989 Other specified abnormal findings of blood chemistry: Secondary | ICD-10-CM

## 2012-09-13 DIAGNOSIS — E785 Hyperlipidemia, unspecified: Secondary | ICD-10-CM

## 2012-09-13 DIAGNOSIS — R609 Edema, unspecified: Secondary | ICD-10-CM

## 2012-09-13 HISTORY — DX: Localized edema: R60.0

## 2012-09-13 LAB — VITAMIN D 1,25 DIHYDROXY
Vitamin D 1, 25 (OH)2 Total: 31 pg/mL (ref 18–72)
Vitamin D3 1, 25 (OH)2: 31 pg/mL

## 2012-09-13 MED ORDER — ATORVASTATIN CALCIUM 20 MG PO TABS: ORAL_TABLET | ORAL | Status: DC

## 2012-09-13 NOTE — Progress Notes (Signed)
  Subjective:    Patient ID: Jose Leach, male    DOB: Jul 13, 1932, 79 y.o.   MRN: [id number]  HPI He has had swelling from his foot to the left hip over  past week.He had fallen  over a week ago in the bathroom striking the left posterior hip area. He was seen by his orthopedist who performed a venous Doppler 09/07/12 which was negative. He was referred for evaluation of other possible etiologies such as kidney impairment. He is not on Amlodipine.  Labs performed 09/08/13 were reviewed.LDL is 132.5 on Simvastatin 40 mg daily.. Liver function tests are completely normal. TSH is therapeutic. Vitamin D level is low at 31.    Review of Systems He denies chest pain, palpitations, dyspnea,  PND or hemoptysis.   He has been on Senokot for severe constipation with good response.  He was recently hospitalized with bradycardia; he was evaluated by Dr. Graciela Husbands who felt that a pacemaker was not indicated.        Objective:   Physical Exam Gen.: Thin but adequately nourished in appearance. Alert, appropriate and cooperative throughout exam.  Eyes: No corneal or conjunctival inflammation noted. No icterus. Neck: No deformities, masses, or tenderness noted.  Thyroid normal. No NVD @15  degrees Lungs: Normal respiratory effort; chest expands symmetrically. Lungs are clear to auscultation without rales, wheezes, or increased work of breathing. Heart: Slow  rate and regular rhythm. Normal S1 and S2. No gallop, click, or rub. S4 w/o murmur. Abdomen: Bowel sounds normal; abdomen soft and nontender. No masses, organomegaly or hernias noted.No HJR                                 Musculoskeletal/extremities:There is some asymmetry of the posterior thoracic musculature suggesting occult scoliosis. No clubbing or cyanosis. 1/2 + LLE & trace RLE edema. Range of motion normal .Tone increased in limbs Chronic toenail changes. Able to lie down & sit up w/o help but very tremulous. Equivocal Homan's  LLE Vascular: Carotid or radial artery pulses are full and equal. Decreased pedal pulses. No bruits present. Neurologic: Alert and oriented x3. Deep tendon reflexes symmetrical and normal.  Gait unsteady .        Skin: Intact without suspicious lesions or rashes. Lymph: No cervical, axillary lymphadenopathy present. Psych: Mood and affect are normal. Normally interactive                                                                                        Assessment & Plan:  #1 edema left lower extremity with equivocal Homans sign. Negative venous Doppler 7/24. D-dimer will be checked along with BMET to assess protein stores and renal function.  #2 dyslipidemia with LDL of 132.5 on simvastatin 40. This will be changed to atorvastatin 20 in attempt to minimize her drug interactions. Lipids will be rechecked along with hepatic panel after 10 weeks in the new medication.  #3 vitamin D deficiency; he'll be asked to add at least one vitamin D3 1000 international units 3 times a week to his present dose.

## 2012-09-13 NOTE — Patient Instructions (Addendum)
Please  schedule fasting Labs in 10 weeks after medication & nutrition changes: CK, NMR Lipoprofile Lipid Panel, hepatic panel. PLEASE BRING THESE INSTRUCTIONS TO FOLLOW UP  LAB APPOINTMENT.This will guarantee correct labs are drawn, eliminating need for repeat blood sampling ( needle sticks ! ). Diagnoses /Codes: 272.4, 995.20 Wear over the calf support hose if you are standing for prolonged periods of  time ; elavate feet as much as possible.Marland Kitchen

## 2012-09-14 ENCOUNTER — Ambulatory Visit (HOSPITAL_COMMUNITY): Admission: RE | Admit: 2012-09-14 | Payer: Medicare Other | Source: Ambulatory Visit

## 2012-09-14 LAB — BASIC METABOLIC PANEL
CO2: 25 mEq/L (ref 19–32)
Calcium: 9.1 mg/dL (ref 8.4–10.5)
Creatinine, Ser: 1.3 mg/dL (ref 0.4–1.5)
GFR: 56.66 mL/min — ABNORMAL LOW (ref >60.00)
Glucose, Bld: 92 mg/dL (ref 70–99)
Sodium: 139 mEq/L (ref 135–145)

## 2012-09-15 ENCOUNTER — Ambulatory Visit (HOSPITAL_COMMUNITY)
Admission: RE | Admit: 2012-09-15 | Discharge: 2012-09-15 | Disposition: A | Discharge status: Home / Self Care | Payer: Medicare Other | Source: Ambulatory Visit | Attending: Internal Medicine | Admitting: Internal Medicine

## 2012-09-15 ENCOUNTER — Other Ambulatory Visit (HOSPITAL_COMMUNITY): Payer: Self-pay | Admitting: Internal Medicine

## 2012-09-15 DIAGNOSIS — R7989 Other specified abnormal findings of blood chemistry: Secondary | ICD-10-CM

## 2012-09-15 DIAGNOSIS — R609 Edema, unspecified: Secondary | ICD-10-CM | POA: Insufficient documentation

## 2012-09-15 DIAGNOSIS — M7989 Other specified soft tissue disorders: Secondary | ICD-10-CM

## 2012-09-15 DIAGNOSIS — R600 Localized edema: Secondary | ICD-10-CM

## 2012-09-15 DIAGNOSIS — R791 Abnormal coagulation profile: Secondary | ICD-10-CM | POA: Insufficient documentation

## 2012-09-15 NOTE — Progress Notes (Signed)
Left lower extremity venous duplex completed.  Left:  No evidence of DVT, superficial thrombosis, or Baker's cyst.  Right:  Negative for DVT in the common femoral vein.  

## 2012-09-21 ENCOUNTER — Encounter: Payer: Self-pay | Admitting: Physician Assistant

## 2012-09-21 ENCOUNTER — Ambulatory Visit (INDEPENDENT_AMBULATORY_CARE_PROVIDER_SITE_OTHER): Payer: Medicare Other | Admitting: Physician Assistant

## 2012-09-21 VITALS — BP 127/72 | HR 58 | Ht 73.0 in | Wt 153.0 lb

## 2012-09-21 DIAGNOSIS — I6529 Occlusion and stenosis of unspecified carotid artery: Secondary | ICD-10-CM

## 2012-09-21 DIAGNOSIS — I739 Peripheral vascular disease, unspecified: Secondary | ICD-10-CM

## 2012-09-21 DIAGNOSIS — E785 Hyperlipidemia, unspecified: Secondary | ICD-10-CM

## 2012-09-21 DIAGNOSIS — I498 Other specified cardiac arrhythmias: Secondary | ICD-10-CM

## 2012-09-21 NOTE — Progress Notes (Signed)
[address] [place], [place]  [address] Phone: [phone] Fax:  [phone]  Date:  09/21/2012   ID:  Jose Leach, DOB 1932/04/02, MRN [id number]  PCP:  [name], MD  Cardiologist:  Dr. Tonny Bollman     History of Present Illness: Jose Leach is a 79 y.o. male who returns for follow up after an admission to the hospital 6/15-6/19 with weakness in the setting of sinus bradycardia.  He has a history of PAD, carotid stenosis, status post left CEA in 2007, HL, hypothyroidism, spinal stenosis, peripheral neuropathy, depression. He was previously followed by Dr. Samule Ohm. Exercise treadmill test in 06/2006 demonstrated no ischemic changes and normal chronotropic competence.  He presented to the hospital with weakness and fatigue. He typically ambulates with a walker. He experienced mechanical falls without significant injury. In the emergency room he was noted to be bradycardic with heart rates into the 40s. He was admitted for further evaluation. He was seen by EP. He was not felt to require a pacemaker. ETT was arranged but the patient could barely get out of bed. Head CT, brain MRI were essentially normal. Echocardiogram 08/01/12: Mild LVH, EF 55-60%, normal wall motion. He was seen by physical and occupational therapy. Skilled nursing facility placement was recommended. He lives at [facility]. He has moved back into his apartment from the skilled nursing facility after several weeks. He has had some difficulty with LE edema since discharge and left lower extremity venous Dopplers recently were negative for DVT. He is wearing compression stockings. Since discharge, he denies any dizziness, syncope or near-syncope. He denies chest pain. He walks with a walker and is limited by neuropathy. He denies any significant dyspnea. Denies orthopnea, PND or edema.  Labs (6/14):  K 3.8, Cr 1.32, Hgb 14.4, PLT 109K Labs (7/14):  K 4.7, Cr 1.3, ALT 13, TP 6.3, Alb 3.9,  HDL 46.9, LDL 132.5, TSH 2.59  Wt Readings from Last 3 Encounters:  09/13/12 158 lb (71.668 kg)  08/22/12 149 lb (67.586 kg)  08/01/12 152 lb 8.9 oz (69.2 kg)     Past Medical History  Diagnosis Date  . Migraines     Dr [name]  . Peripheral neuropathy   . Benign essential tremor   . Cervical stenosis of spine   . Anemia     B12 deficiency  . Testosterone deficiency     Dr [name], [facility]  . Hypothyroidism   . Hyperlipidemia   . Depression     Dr [name]  . History of shingles 2009  . H/O cardiovascular stress test     a. 02/2003 -> no ischemia/infarct.  Marland Kitchen Hx of echocardiogram     Echocardiogram 08/01/12: Mild LVH, EF 55-60%, normal wall motion.  . Sinus bradycardia   . PAD (peripheral artery disease)   . Carotid stenosis     s/p L CEA    Current Outpatient Prescriptions  Medication Sig Dispense Refill  . senna (SENOKOT) 8.6 MG TABS Take 1 tablet by mouth 2 (two) times daily.      Marland Kitchen aspirin 81 MG tablet Take 81 mg by mouth daily.      Marland Kitchen atorvastatin (LIPITOR) 20 MG tablet In place of Simvastatin  30 tablet  2  . b complex vitamins capsule Take 1 capsule by mouth daily.      . bimatoprost (LUMIGAN) 0.01 % SOLN Place 1 drop into both eyes at bedtime.      . brimonidine (ALPHAGAN) 0.15 %  ophthalmic solution       . buPROPion (WELLBUTRIN XL) 300 MG 24 hr tablet Take 300 mg by mouth daily.      . Cholecalciferol (VITAMIN D3) 2000 UNITS TABS Take 4,000 Units by mouth daily.       . Flaxseed, Linseed, (FLAX SEEDS PO) Take 1 tablet by mouth daily.       [name] Oil 500 MG CAPS Take 500 mg by mouth daily.       Marland Kitchen levothyroxine (SYNTHROID, LEVOTHROID) 50 MCG tablet Take 50 mcg by mouth daily before breakfast. 1/2 on Sunday; 1 all other days      . lisinopril (PRINIVIL) 10 MG tablet Take 1 tablet (10 mg total) by mouth daily.  30 tablet  6  . mirtazapine (REMERON) 45 MG tablet Take 45 mg by mouth at bedtime.      . Multiple Vitamins-Minerals (ICAPS PO) Take 1 capsule by mouth 2  (two) times daily.       . prednisoLONE acetate (PRED FORTE) 1 % ophthalmic suspension Place 1 drop into the right eye 3 (three) times daily.      . sertraline (ZOLOFT) 100 MG tablet Take 200 mg by mouth daily.       . simvastatin (ZOCOR) 40 MG tablet       . testosterone ([name]) 50 MG/5GM GEL Place 5 g onto the skin daily.      Marland Kitchen topiramate (TOPAMAX) 25 MG tablet Take 25-50 mg by mouth 2 (two) times daily. 25 mg in the a.m. And 50 mg in the p.m.      Marland Kitchen vitamin B-12 (CYANOCOBALAMIN) 1000 MCG tablet Take 1,000 mcg by mouth daily.       No current facility-administered medications for this visit.    Allergies:    Allergies  Allergen Reactions  . Latex     REACTION: rash from adhesive  . Tape Rash    Paper tape is ok to use     Social History:  The patient  reports that he quit smoking about 33 years ago. His smoking use included Pipe and Cigars. He has never used smokeless tobacco. He reports that he drinks about 4.2 ounces of alcohol per week. He reports that he does not use illicit drugs.   ROS:  Please see the history of present illness.   He denies claudication.   All other systems reviewed and negative.   PHYSICAL EXAM: VS:  BP 127/72  Pulse 58  Ht 6\' 1"  (1.854 m)  Wt 153 lb (69.4 kg)  BMI 20.19 kg/m2 Frail, chronically ill-appearing male in no acute distress HEENT: normal Neck: no JVDat 90 Cardiac:  normal S1, S2; RRR; no murmur Lungs:  clear to auscultation bilaterally, no wheezing, rhonchi or rales Abd: soft, nontender, no hepatomegaly Ext: no edema, compression stockings in place Skin: warm and dry Neuro:  CNs 2-12 intact, no focal abnormalities noted  EKG:  Sinus bradycardia, HR 58, normal axis, no ST changes     ASSESSMENT AND PLAN:  1. Sinus Bradycardia:  Patient is asymptomatic. He does not require permanent pacemaker implantation. No further cardiac workup at this time. 2. Carotid Stenosis: Continue followup with vascular surgery. Continue  aspirin. 3. Hyperlipidemia: Managed by PCP. Continue statin. 4. Hypertension: Controlled. 5. Disposition: Follow up with Dr. Excell Seltzer in 6 months.  Signed, Tereso Newcomer, PA-C  09/21/2012 12:27 PM

## 2012-09-21 NOTE — Patient Instructions (Addendum)
Your physician recommends that you continue on your current medications as directed. Please refer to the Current Medication list given to you today.  Your physician recommends that you schedule a follow-up appointment in: 6 months Dr.Cooper

## 2012-10-09 DIAGNOSIS — K4020 Bilateral inguinal hernia, without obstruction or gangrene, not specified as recurrent: Secondary | ICD-10-CM

## 2012-10-09 DIAGNOSIS — N3941 Urge incontinence: Secondary | ICD-10-CM

## 2012-10-09 HISTORY — DX: Bilateral inguinal hernia, without obstruction or gangrene, not specified as recurrent: K40.20

## 2012-10-09 HISTORY — DX: Urge incontinence: N39.41

## 2012-10-10 ENCOUNTER — Telehealth: Payer: Self-pay | Admitting: Gastroenterology

## 2012-10-10 NOTE — Telephone Encounter (Signed)
Pt states he has not had a BM in 3 days. Pt states he has been taking senokot and some suppositories. Discussed with patient that he can try Miralax OTC and to call us back if no better. Pt verbalized understanding.

## 2012-10-11 ENCOUNTER — Telehealth: Payer: Self-pay | Admitting: Gastroenterology

## 2012-10-11 ENCOUNTER — Encounter: Payer: Self-pay | Admitting: Nurse Practitioner

## 2012-10-11 ENCOUNTER — Ambulatory Visit (INDEPENDENT_AMBULATORY_CARE_PROVIDER_SITE_OTHER): Payer: Medicare Other | Admitting: Nurse Practitioner

## 2012-10-11 VITALS — BP 128/66 | HR 68 | Ht 73.0 in | Wt 155.0 lb

## 2012-10-11 DIAGNOSIS — K5641 Fecal impaction: Secondary | ICD-10-CM

## 2012-10-11 DIAGNOSIS — K5909 Other constipation: Secondary | ICD-10-CM

## 2012-10-11 DIAGNOSIS — Z860100 Personal history of colon polyps, unspecified: Secondary | ICD-10-CM

## 2012-10-11 DIAGNOSIS — K5900 Constipation, unspecified: Secondary | ICD-10-CM

## 2012-10-11 HISTORY — DX: Fecal impaction: K56.41

## 2012-10-11 MED ORDER — NA SULFATE-K SULFATE-MG SULF 17.5-3.13-1.6 GM/177ML PO SOLN: 1.0000 | Freq: Once | ORAL | Status: DC

## 2012-10-11 NOTE — Telephone Encounter (Signed)
Pt states that he has not had a bm for 4 days. States now he has some rectal discomfort and thinks he may have a hemorrhoid. Pt scheduled to see Jose Cluster NP today at 1:30pm. Pt aware of appt.

## 2012-10-11 NOTE — Patient Instructions (Addendum)
You have been scheduled for a colonoscopy with propofol. Please follow written instructions given to you at your visit today.  Please pick up your prep kit at the pharmacy within the next 1-3 days. If you use inhalers (even only as needed), please bring them with you on the day of your procedure. Your physician has requested that you go to www.startemmi.com and enter the access code given to you at your visit today. This web site gives a general overview about your procedure. However, you should still follow specific instructions given to you by our office regarding your preparation for the procedure.  Purchase a Mineral oil Enema tonight and use it tonight Come back in tomorrow to see if impaction is gone  Come back in the office on Thursday 10/12/2012 at 11am to see Gunnar Fusi We are giving you a sample kit of Suprep for your colonoscopy

## 2012-10-11 NOTE — Progress Notes (Signed)
  History of Present Illness:  Patient is a 79 year old male known to Dr. Arlyce Dice. He has a family history of colon cancer in a parent. Patient has chronic constipation for which I saw him last in June 2014. At that visit it was noted that patient is overdue for his surveillance colonoscopy. He was therefore scheduled for colonoscopy but canceled the appointment as he was hospitalized after a fall.   Over the last couple of months BMs have been fairly regular on BID Senokot until several days ago when he become constipated again. Patient having some difficulty with details and initially stated constipation recurred after running out of senekot but then states he was only off senokot for a day before calling our office yesterday and being advised to start miralax.Marland Kitchen He frequently feels urge to defecate but cannot pass much stool. We discussed trying suppositories but turns out patient has already been using them. He also takes daily fiber supplements. TSH in late July was normal. He was started on a new bladder medication a few days ago but patient doesn't know name of it.   Current Medications, Allergies, Past Medical History, Past Surgical History, Family History and Social History were reviewed in [employer] record.  Physical Exam: General: Thin white male in no acute distress Head: Normocephalic and atraumatic Eyes:  sclerae anicteric, conjunctiva pink  Ears: Normal auditory acuity Lungs: Clear throughout to auscultation Heart: Regular rate and rhythm Abdomen: Soft, non distended, non-tender. No masses, no hepatomegaly. Normal bowel sounds Rectal: Impacted with stool Musculoskeletal: Symmetrical with no gross deformities  Extremities: BLE pitting edema.  Neurological: Alert oriented x 4, grossly nonfocal. Tremors Psychological:  Alert and cooperative. Normal mood and affect  Assessment and Recommendations:  1. Acute on chronic constipation refractory to bid senekot,  suppositories daily fiber. Started Miralax yesterday but so far no significant BMs. On exam he is impacted with stool. Advised mineral oil enema tonight with recheck here tomorrow.   2. [facility] of colon cancer, overdue for surveillance colonoscopy. Patient's June colonoscopy was rescheduled as he was hospitalized at the time. Will reschedule the colonoscopy

## 2012-10-12 ENCOUNTER — Encounter (HOSPITAL_COMMUNITY): Payer: Self-pay

## 2012-10-12 ENCOUNTER — Encounter: Payer: Self-pay | Admitting: Nurse Practitioner

## 2012-10-12 ENCOUNTER — Observation Stay (HOSPITAL_COMMUNITY)
Admission: AD | Admit: 2012-10-12 | Discharge: 2012-10-13 | Disposition: A | Discharge status: Home / Self Care | Payer: Medicare Other | Source: Ambulatory Visit | Attending: Gastroenterology | Admitting: Gastroenterology

## 2012-10-12 ENCOUNTER — Ambulatory Visit (INDEPENDENT_AMBULATORY_CARE_PROVIDER_SITE_OTHER): Payer: Medicare Other | Admitting: Nurse Practitioner

## 2012-10-12 VITALS — BP 130/70 | HR 70 | Ht 73.0 in | Wt 155.0 lb

## 2012-10-12 DIAGNOSIS — K5641 Fecal impaction: Secondary | ICD-10-CM

## 2012-10-12 DIAGNOSIS — Z9089 Acquired absence of other organs: Secondary | ICD-10-CM | POA: Insufficient documentation

## 2012-10-12 DIAGNOSIS — F3289 Other specified depressive episodes: Secondary | ICD-10-CM | POA: Insufficient documentation

## 2012-10-12 DIAGNOSIS — E785 Hyperlipidemia, unspecified: Secondary | ICD-10-CM | POA: Insufficient documentation

## 2012-10-12 DIAGNOSIS — Z800 Family history of malignant neoplasm of digestive organs: Secondary | ICD-10-CM | POA: Insufficient documentation

## 2012-10-12 DIAGNOSIS — K5909 Other constipation: Secondary | ICD-10-CM

## 2012-10-12 DIAGNOSIS — E039 Hypothyroidism, unspecified: Secondary | ICD-10-CM | POA: Insufficient documentation

## 2012-10-12 DIAGNOSIS — I739 Peripheral vascular disease, unspecified: Secondary | ICD-10-CM | POA: Insufficient documentation

## 2012-10-12 DIAGNOSIS — K5900 Constipation, unspecified: Secondary | ICD-10-CM

## 2012-10-12 DIAGNOSIS — Z7982 Long term (current) use of aspirin: Secondary | ICD-10-CM | POA: Insufficient documentation

## 2012-10-12 DIAGNOSIS — Z96649 Presence of unspecified artificial hip joint: Secondary | ICD-10-CM | POA: Insufficient documentation

## 2012-10-12 DIAGNOSIS — E538 Deficiency of other specified B group vitamins: Secondary | ICD-10-CM | POA: Insufficient documentation

## 2012-10-12 DIAGNOSIS — Z79899 Other long term (current) drug therapy: Secondary | ICD-10-CM | POA: Insufficient documentation

## 2012-10-12 DIAGNOSIS — F329 Major depressive disorder, single episode, unspecified: Secondary | ICD-10-CM | POA: Insufficient documentation

## 2012-10-12 DIAGNOSIS — I10 Essential (primary) hypertension: Secondary | ICD-10-CM | POA: Insufficient documentation

## 2012-10-12 MED ORDER — ONDANSETRON HCL 4 MG PO TABS: 4.0000 mg | ORAL_TABLET | Freq: Four times a day (QID) | ORAL | Status: DC | PRN

## 2012-10-12 MED ORDER — PREDNISOLONE ACETATE 1 % OP SUSP: 1.0000 [drp] | Freq: Three times a day (TID) | OPHTHALMIC | Status: DC

## 2012-10-12 MED ORDER — SORBITOL 70 % SOLN
960.0000 mL | TOPICAL_OIL | Freq: Once | ORAL | Status: AC
  Administered 2012-10-12: 960 mL via RECTAL
  Filled 2012-10-12: qty 240

## 2012-10-12 MED ORDER — SODIUM CHLORIDE 0.9 % IV SOLN: 250.0000 mL | INTRAVENOUS | Status: DC | PRN

## 2012-10-12 MED ORDER — SODIUM CHLORIDE 0.9 % IJ SOLN: 3.0000 mL | Freq: Two times a day (BID) | INTRAMUSCULAR | Status: DC

## 2012-10-12 MED ORDER — SODIUM CHLORIDE 0.9 % IJ SOLN: 3.0000 mL | INTRAMUSCULAR | Status: DC | PRN

## 2012-10-12 MED ORDER — SODIUM CHLORIDE 0.9 % IV SOLN: INTRAVENOUS | Status: DC

## 2012-10-12 MED ORDER — SODIUM CHLORIDE 0.9 % IV SOLN
INTRAVENOUS | Status: DC
  Administered 2012-10-12: 14:00:00 via INTRAVENOUS

## 2012-10-12 MED ORDER — ONDANSETRON HCL 4 MG/2ML IJ SOLN: 4.0000 mg | Freq: Four times a day (QID) | INTRAMUSCULAR | Status: DC | PRN

## 2012-10-12 NOTE — Progress Notes (Signed)
Enemas have been successful at breaking up stool impaction.   Plan discharge home.

## 2012-10-12 NOTE — H&P (Signed)
Attending physician's note   I have taken a history, examined the patient and reviewed the chart. I agree with the Advanced Practitioner's note, impression and recommendations.  Meryl Dare, MD Clementeen Graham

## 2012-10-12 NOTE — Progress Notes (Signed)
Attending physician's note   I have taken a history, examined the patient and reviewed the chart. I agree with the Advanced Practitioner's note, impression and recommendations.  Doris Mcgilvery T Idalie Canto, MD FACG 

## 2012-10-12 NOTE — Progress Notes (Signed)
Primary Care Physician:  [name], MD Primary Gastroenterologist: Melvia Heaps,  MD  CHIEF COMPLAINT:  Fecal impaction  HPI: Jose Leach is a 79 y.o. male known to Dr. Arlyce Dice. He has a family history of colon cancer in a parent. Patient has chronic constipation for which I saw him last June 2014. At that visit it was noted that patient was overdue for his surveillance colonoscopy. The colonoscopy was rescheduled as patient was hospitalized for a fall around time of procedure.  I saw Jose Leach yesterday for acute on chronic constipation refractory to twice daily Senokot, fiber supplements and most recently MiraLax. He had a fecal impaction on exam. Patient was advised to use a mineral oil enema last night and return for recheck today. Patient had a difficult time administering the enema. He had very little stool output and remains impacted today on exam. In the office I attempted to disimpact the patient. He was given 2 enemas with only partial success of disimpaction.  l  Past Medical History  Diagnosis Date  . Migraines     Dr [name]  . Peripheral neuropathy   . Benign essential tremor   . Cervical stenosis of spine   . Anemia     B12 deficiency  . Testosterone deficiency     Dr [name], [facility]  . Hypothyroidism   . Hyperlipidemia   . Depression     Dr [name]  . History of shingles 2009  . H/O cardiovascular stress test     a. 02/2003 -> no ischemia/infarct.  Marland Kitchen Hx of echocardiogram     Echocardiogram 08/01/12: Mild LVH, EF 55-60%, normal wall motion.  . Sinus bradycardia   . PAD (peripheral artery disease)   . Carotid stenosis     s/p L CEA    Past Surgical History  Procedure Laterality Date  . Appendectomy    . Lumbar laminectomy    . Carotid endarterectomy       L  . Tonsillectomy and adenoidectomy    . Total hip arthroplasty      L  . Cataract extraction Right   . Eye surgery Right 02/2012    retina    Prior to Admission medications   Medication  Sig Start Date End Date Taking? Authorizing Provider  aspirin 81 MG tablet Take 81 mg by mouth daily.   Yes Historical Provider, MD  b complex vitamins capsule Take 1 capsule by mouth daily.   Yes Historical Provider, MD  bimatoprost (LUMIGAN) 0.01 % SOLN Place 1 drop into both eyes at bedtime.   Yes Historical Provider, MD  brimonidine (ALPHAGAN) 0.15 % ophthalmic solution Place 1 drop into the right eye 2 (two) times daily.  09/06/12  Yes Historical Provider, MD  buPROPion (WELLBUTRIN XL) 300 MG 24 hr tablet Take 300 mg by mouth daily.   Yes Historical Provider, MD  Cholecalciferol (VITAMIN D3) 2000 UNITS TABS Take 4,000 Units by mouth daily.    Yes Historical Provider, MD  Flaxseed, Linseed, (FLAX SEEDS PO) Take 1 tablet by mouth daily.    Yes Historical Provider, MD  [name] Oil 500 MG CAPS Take 500 mg by mouth daily.    Yes Historical Provider, MD  levothyroxine (SYNTHROID, LEVOTHROID) 50 MCG tablet Take 50 mcg by mouth daily before breakfast. 1/2 on Sunday; 1 all other days   Yes Historical Provider, MD  lisinopril (PRINIVIL) 10 MG tablet Take 1 tablet (10 mg total) by mouth daily. 08/03/12  Yes Ok Anis, NP  mirtazapine (REMERON) 45 MG tablet Take 45 mg by mouth at bedtime.   Yes Historical Provider, MD  Multiple Vitamins-Minerals (ICAPS PO) Take 1 capsule by mouth 2 (two) times daily.    Yes Historical Provider, MD  polyethylene glycol (MIRALAX / GLYCOLAX) packet 17 g. 3 capfuls daily   Yes Historical Provider, MD  prednisoLONE acetate (PRED FORTE) 1 % ophthalmic suspension Place 1 drop into the right eye 3 (three) times daily.   Yes Historical Provider, MD  senna (SENOKOT) 8.6 MG TABS Take 1 tablet by mouth 2 (two) times daily.   Yes Historical Provider, MD  sertraline (ZOLOFT) 100 MG tablet Take 200 mg by mouth daily.    Yes Historical Provider, MD  tamsulosin (FLOMAX) 0.4 MG CAPS capsule Take 0.4 mg by mouth daily.   Yes Historical Provider, MD  testosterone ([name]) 50 MG/5GM GEL  Place 5 g onto the skin daily.   Yes Historical Provider, MD  topiramate (TOPAMAX) 25 MG tablet Take 25-50 mg by mouth 2 (two) times daily. 25 mg in the a.m. And 50 mg in the p.m.   Yes Historical Provider, MD  vitamin B-12 (CYANOCOBALAMIN) 1000 MCG tablet Take 1,000 mcg by mouth daily.   Yes Historical Provider, MD  atorvastatin (LIPITOR) 20 MG tablet Take 20 mg by mouth daily.    Historical Provider, MD      Allergies as of 10/12/2012 - Review Complete 10/12/2012  Allergen Reaction Noted  . Latex  04/02/2006  . Tape Rash 11/11/2011    Family History  Problem Relation Age of Onset  . Stroke Mother 102  . Rectal cancer Father 17  . Hypertension Mother   . Heart disease Father     in 45s  . Diabetes Mother   . Nephritis Sister   . Seizures Brother     died as child    History   Social History  . Marital Status: Married    Spouse Name: Alvino Chapel    Number of Children: 2  . Years of Education: N/A   Occupational History  . retired    Social History Main Topics  . Smoking status: Former Smoker    Types: Pipe, Cigars    Quit date: 02/16/1979  . Smokeless tobacco: Never Used     Comment: Quit about age 76   . Alcohol Use: 4.2 oz/week    7 Glasses of wine per week     Comment:    . Drug Use: No  . Sexual Activity: No   Review of Systems:  All systems reviewed an negative except where noted in HPI.  Physical Exam: Vital signs  BP 130/70, pulse 70, weight 155 General:  Pleasant, thin [place] in NAD Head:  Normocephalic and atraumatic. Eyes:  Sclera clear, no icterus.   Conjunctiva pink. Ears:  Normal auditory acuity. Mouth:  No deformity or lesions.  Neck:  Supple; no masses . Lungs:  Clear throughout to auscultation.   No wheezes, crackles, or rhonchi. No acute distress. Heart:  Regular rate and rhythm Abdomen:  Soft, nondistended, nontender. No masses, hepatomegaly. No obvious masses.  Normal bowel .    Rectal:  Impacted with a large amount of semi-soft stool   Msk:  Symmetrical without gross deformities.. Extremities:  Bilateral lower extremity edema. Neurologic:  Alert and  oriented x4;  grossly normal neurologically. Skin:  Intact without significant lesions or rashes. Psych:  Alert and cooperative. Normal mood and affect.   Impression / Plan:  1. Acute on chronic constipation  with fecal impaction refractory to fiber, senekot, and Miralax. We administered two enemas in office today and manually removed some stool but still impacted. Patient unable to administer enemas at home. He will need to be admitted for observation. Will order SMOG enema and IV fluids in case mild dehydration is contributing to acute constipation. If fecal impaction resolves patient can be discharged later today on BID Miralax.   2. [facility] of colon cancer, overdue for surveillance colonoscopy. Patient's June colonoscopy was cancelled as he was hospitalized at the time. Will reschedule the colonoscopy.   3. Hypothyroidism, on synthroid. TSH normal.   4. Multiple medical problems including, but not limited to HTN, PAD, and depression.      @RRHLOS @  Willette Cluster  10/12/2012, 3:54 PM

## 2012-10-12 NOTE — Progress Notes (Signed)
Pt was transferred to 1314 at 2020pm. 5th floor nurse said she was still waiting for MD to put in discharge review and had made several calls. Pt's states his son has to go so he would prefer to stay and leave tomorrow morning.

## 2012-10-12 NOTE — H&P (Signed)
Primary Care Physician:  [name], MD Primary Gastroenterologist:  Melvia Heaps,  MD  CHIEF COMPLAINT:  fecal impaction  HPI: Jose Leach is a 79 y.o. male known to Dr. Arlyce Dice. He has a family history of colon cancer in a parent. Patient has chronic constipation for which I saw him last June 2014. At that visit it was noted that patient was overdue for his surveillance colonoscopy. The colonoscopy was rescheduled as patient was hospitalized for a fall around time of procedure. I saw Jose Leach yesterday for acute on chronic constipation refractory to twice daily Senokot, fiber supplements and most recently MiraLax. He had a fecal impaction on exam. Patient was advised to use a mineral oil enema last night and return for recheck today. Patient had a difficult time administering the enema. He had very little stool output and remains impacted today on exam. In the office I attempted to disimpact the patient. He was given 2 enemas with only partial success of disimpaction  Past Medical History  Diagnosis Date  . Migraines     Dr [name]  . Peripheral neuropathy   . Benign essential tremor   . Cervical stenosis of spine   . Anemia     B12 deficiency  . Testosterone deficiency     Dr [name], [facility]  . Hypothyroidism   . Hyperlipidemia   . Depression     Dr [name]  . History of shingles 2009  . H/O cardiovascular stress test     a. 02/2003 -> no ischemia/infarct.  Marland Kitchen Hx of echocardiogram     Echocardiogram 08/01/12: Mild LVH, EF 55-60%, normal wall motion.  . Sinus bradycardia   . PAD (peripheral artery disease)   . Carotid stenosis     s/p L CEA    Past Surgical History  Procedure Laterality Date  . Appendectomy    . Lumbar laminectomy    . Carotid endarterectomy       L  . Tonsillectomy and adenoidectomy    . Total hip arthroplasty      L  . Cataract extraction Right   . Eye surgery Right 02/2012    retina    Prior to Admission medications   Medication Sig  Start Date End Date Taking? Authorizing Provider  aspirin 81 MG tablet Take 81 mg by mouth daily.   Yes Historical Provider, MD  atorvastatin (LIPITOR) 20 MG tablet Take 20 mg by mouth daily.   Yes Historical Provider, MD  b complex vitamins capsule Take 1 capsule by mouth daily.   Yes Historical Provider, MD  bimatoprost (LUMIGAN) 0.01 % SOLN Place 1 drop into both eyes at bedtime.   Yes Historical Provider, MD  brimonidine (ALPHAGAN) 0.15 % ophthalmic solution Place 1 drop into the right eye 2 (two) times daily.  09/06/12  Yes Historical Provider, MD  buPROPion (WELLBUTRIN XL) 300 MG 24 hr tablet Take 300 mg by mouth daily.   Yes Historical Provider, MD  Cholecalciferol (VITAMIN D3) 2000 UNITS TABS Take 4,000 Units by mouth daily.    Yes Historical Provider, MD  Flaxseed, Linseed, (FLAX SEEDS PO) Take 1 tablet by mouth daily.    Yes Historical Provider, MD  [name] Oil 500 MG CAPS Take 500 mg by mouth daily.    Yes Historical Provider, MD  levothyroxine (SYNTHROID, LEVOTHROID) 50 MCG tablet Take 50 mcg by mouth daily before breakfast. 1/2 on Sunday; 1 all other days   Yes Historical Provider, MD  lisinopril (PRINIVIL) 10 MG tablet Take  1 tablet (10 mg total) by mouth daily. 08/03/12  Yes Ok Anis, NP  mirtazapine (REMERON) 45 MG tablet Take 45 mg by mouth at bedtime.   Yes Historical Provider, MD  Multiple Vitamins-Minerals (ICAPS PO) Take 1 capsule by mouth 2 (two) times daily.    Yes Historical Provider, MD  polyethylene glycol (MIRALAX / GLYCOLAX) packet 17 g. 3 capfuls daily   Yes Historical Provider, MD  senna (SENOKOT) 8.6 MG TABS Take 1 tablet by mouth 2 (two) times daily.   Yes Historical Provider, MD  testosterone ([name]) 50 MG/5GM GEL Place 5 g onto the skin daily.   Yes Historical Provider, MD  topiramate (TOPAMAX) 25 MG tablet Take 25-50 mg by mouth 2 (two) times daily. 25 mg in the a.m. And 50 mg in the p.m.   Yes Historical Provider, MD  vitamin B-12 (CYANOCOBALAMIN) 1000  MCG tablet Take 1,000 mcg by mouth daily.   Yes Historical Provider, MD  prednisoLONE acetate (PRED FORTE) 1 % ophthalmic suspension Place 1 drop into the right eye 3 (three) times daily.    Historical Provider, MD  sertraline (ZOLOFT) 100 MG tablet Take 200 mg by mouth daily.     Historical Provider, MD  tamsulosin (FLOMAX) 0.4 MG CAPS capsule Take 0.4 mg by mouth daily.    Historical Provider, MD    Current Facility-Administered Medications  Medication Dose Route Frequency Provider Last Rate Last Dose  . 0.9 %  sodium chloride infusion   Intravenous Continuous Meredith Pel, NP      . 0.9 %  sodium chloride infusion  250 mL Intravenous PRN Meredith Pel, NP      . 0.9 %  sodium chloride infusion   Intravenous Continuous Meredith Pel, NP      . ondansetron [facility]) tablet 4 mg  4 mg Oral Q6H PRN Meredith Pel, NP       Or  . ondansetron [facility]) injection 4 mg  4 mg Intravenous Q6H PRN Meredith Pel, NP      . sodium chloride 0.9 % injection 3 mL  3 mL Intravenous Q12H Meredith Pel, NP      . sodium chloride 0.9 % injection 3 mL  3 mL Intravenous PRN Meredith Pel, NP      . sorbitol, milk of mag, mineral oil, glycerin (SMOG) enema  960 mL Rectal Once Meredith Pel, NP        Allergies as of 10/12/2012 - Review Complete 10/12/2012  Allergen Reaction Noted  . Latex  04/02/2006  . Tape Rash 11/11/2011    Family History  Problem Relation Age of Onset  . Stroke Mother 62  . Rectal cancer Father 52  . Hypertension Mother   . Heart disease Father     in 33s  . Diabetes Mother   . Nephritis Sister   . Seizures Brother     died as child    History   Social History  . Marital Status: Married    Spouse Name: Alvino Chapel    Number of Children: 2  . Years of Education: N/A   Occupational History  . retired    Social History Main Topics  . Smoking status: Former Smoker    Types: Pipe, Cigars    Quit date: 02/16/1979  . Smokeless tobacco: Never Used      Comment: Quit about age 75   . Alcohol Use: 4.2 oz/week    7 Glasses of wine per week  Comment:    . Drug Use: No  . Sexual Activity: No   Review of Systems:  All systems reviewed an negative except where noted in HPI.  Physical Exam: General: Pleasant, thin white male in NAD  Head: Normocephalic and atraumatic.  Eyes: Sclera clear, no icterus. Conjunctiva pink.  Ears: Normal auditory acuity.  Mouth: No deformity or lesions.  Neck: Supple; no masses .  Lungs: Clear throughout to auscultation. No wheezes, crackles, or rhonchi. No acute distress.  Heart: Regular rate and rhythm  Abdomen: Soft, nondistended, nontender. No masses, hepatomegaly. No obvious masses. Normal bowel .  Rectal: Impacted with a large amount of semi-soft stool  Msk: Symmetrical without gross deformities..  Extremities: Bilateral lower extremity edema.  Neurologic: Alert and oriented x4; grossly normal neurologically.  Skin: Intact without significant lesions or rashes.  Psych: Alert and cooperative. Normal mood and affect.    Impression / Plan:  1. Acute on chronic constipation with fecal impaction refractory to fiber, senekot, and Miralax. We administered two enemas in office today and manually removed some stool but still impacted. Patient unable to administer enemas at home. He will need to be admitted for observation. Will order SMOG enema and IV fluids in case mild dehydration is contributing to acute constipation. If fecal impaction resolves patient can be discharged later today on BID Miralax.   2. [facility] of colon cancer, overdue for surveillance colonoscopy. Patient's June colonoscopy was cancelled as he was hospitalized at the time. Will reschedule the colonoscopy.   3. Hypothyroidism, on synthroid. TSH normal.   4. Multiple medical problems including, but not limited to HTN, PAD, and depression.          LOS: 0 days   Willette Cluster  10/12/2012, 4:20 PM

## 2012-10-13 NOTE — Progress Notes (Signed)
PA called that patients discharge was put in. i informed PA that pt missed his ride so wil prefer to leave tomorrow. It was all right with P.A. Pt will be discharged in the morning.

## 2012-10-13 NOTE — Discharge Summary (Signed)
[facility] Discharge Summary  Name: Jose Leach MRN: [id number] DOB: 1932/04/06 79 y.o. PCP:  [name], MD  Date of Admission: 10/12/2012  1:07 PM Date of Discharge: 10/13/2012 Primary Gastroenterologist: Melvia Heaps, MD Discharging Physician: Claudette Head, MD  Discharge Diagnosis: Fecal impaction  Consultations:none   Procedures Performed:  None  GI Procedures: none  History/Physical Exam:  See Admission H&P  Admission HPI: Jose Leach is a 79 y.o. male known to Dr. Arlyce Dice. He has a family history of colon cancer in a parent. Patient has chronic constipation for which I saw him last June 2014. At that visit it was noted that patient was overdue for his surveillance colonoscopy. The colonoscopy was rescheduled as patient was hospitalized for a fall around time of procedure. I saw Jose Leach yesterday for acute on chronic constipation refractory to twice daily Senokot, fiber supplements and most recently MiraLax. He had a fecal impaction on exam. Patient was advised to use a mineral oil enema last night and return for recheck today. Patient had a difficult time administering the enema. He had very little stool output and remains impacted today on exam. In the office I attempted to disimpact the patient. He was given 2 enemas with only partial success of disimpaction   Hospital Course by problem list: Fecal impaction. After unsuccessfully being able to disimpact patient in the office he was admitted to the hospital for an observational stay. Since discharge was anticipated to be later in the evening or in the early a.m. most of patient's home medications were held. Patient was given IV fluids, a clear liquid diet. A SMOG enema was given with excellent results. Patient had an uneventful overnight stay in the hospital. He was discharged home the following morning.  Discharge Vitals:  BP 135/61  Pulse 60  Temp(Src) 98.1 F (36.7 C) (Oral)  Resp 16  SpO2  95%  Discharge Labs: No results found for this or any previous visit (from the past 24 hour(s)).  Disposition and follow-up:   Jose Leach was discharged from [facility] in stable condition.    Follow-up Appointments:  Future Appointments Provider Department Dept Phone   10/18/2012 2:00 PM Louis Meckel, MD [facility] [phone]   04/18/2013 2:30 PM York Spaniel, MD [facility] [phone]      Discharge Medications:   Medication List         aspirin 81 MG tablet  Take 81 mg by mouth daily.     atorvastatin 20 MG tablet  Commonly known as:  LIPITOR  Take 20 mg by mouth daily.     b complex vitamins capsule  Take 1 capsule by mouth daily.     bimatoprost 0.01 % Soln  Commonly known as:  LUMIGAN  Place 1 drop into both eyes at bedtime.     brimonidine 0.15 % ophthalmic solution  Commonly known as:  ALPHAGAN  Place 1 drop into the right eye 2 (two) times daily.     FLAX SEEDS PO  Take 1 tablet by mouth daily.     ICAPS PO  Take 1 capsule by mouth 2 (two) times daily.     Krill Oil 500 MG Caps  Take 500 mg by mouth daily.     levothyroxine 50 MCG tablet  Commonly known as:  SYNTHROID, LEVOTHROID  Take 50 mcg by mouth daily before breakfast. 1/2 on Sunday; 1 all other days     lisinopril 10 MG tablet  Commonly known as:  PRINIVIL  Take 1 tablet (10 mg total) by mouth daily.     mirtazapine 45 MG tablet  Commonly known as:  REMERON  Take 45 mg by mouth at bedtime.     polyethylene glycol packet  Commonly known as:  MIRALAX / GLYCOLAX  17 g. 3 capfuls daily     prednisoLONE acetate 1 % ophthalmic suspension  Commonly known as:  PRED FORTE  Place 1 drop into the right eye 3 (three) times daily.     senna 8.6 MG Tabs tablet  Commonly known as:  SENOKOT  Take 1 tablet by mouth 2 (two) times daily.     sertraline 100 MG tablet  Commonly known as:  ZOLOFT  Take 200 mg by mouth daily.      tamsulosin 0.4 MG Caps capsule  Commonly known as:  FLOMAX  Take 0.4 mg by mouth daily.     [name] 50 MG/5GM Gel  Generic drug:  testosterone  Place 5 g onto the skin daily.     topiramate 25 MG tablet  Commonly known as:  TOPAMAX  Take 25-50 mg by mouth 2 (two) times daily. 25 mg in the a.m. And 50 mg in the p.m.     vitamin B-12 1000 MCG tablet  Commonly known as:  CYANOCOBALAMIN  Take 1,000 mcg by mouth daily.     Vitamin D3 2000 UNITS Tabs  Take 4,000 Units by mouth daily.     WELLBUTRIN XL 300 MG 24 hr tablet  Generic drug:  buPROPion  Take 300 mg by mouth daily.        Signed: Willette Cluster 10/13/2012, 10:23 AM

## 2012-10-14 ENCOUNTER — Other Ambulatory Visit: Payer: Self-pay | Admitting: Internal Medicine

## 2012-10-18 ENCOUNTER — Encounter: Payer: Medicare Other | Admitting: Gastroenterology

## 2012-10-18 ENCOUNTER — Ambulatory Visit (AMBULATORY_SURGERY_CENTER): Payer: Medicare Other | Admitting: Gastroenterology

## 2012-10-18 ENCOUNTER — Encounter: Payer: Self-pay | Admitting: Gastroenterology

## 2012-10-18 VITALS — BP 147/77 | HR 54 | Temp 96.8°F | Resp 19 | Ht 73.0 in | Wt 155.0 lb

## 2012-10-18 DIAGNOSIS — K5730 Diverticulosis of large intestine without perforation or abscess without bleeding: Secondary | ICD-10-CM

## 2012-10-18 DIAGNOSIS — D126 Benign neoplasm of colon, unspecified: Secondary | ICD-10-CM

## 2012-10-18 DIAGNOSIS — Z800 Family history of malignant neoplasm of digestive organs: Secondary | ICD-10-CM

## 2012-10-18 DIAGNOSIS — K5641 Fecal impaction: Secondary | ICD-10-CM

## 2012-10-18 DIAGNOSIS — Z86010 Personal history of colonic polyps: Secondary | ICD-10-CM

## 2012-10-18 MED ORDER — SODIUM CHLORIDE 0.9 % IV SOLN: 500.0000 mL | INTRAVENOUS | Status: DC

## 2012-10-18 NOTE — Telephone Encounter (Signed)
Med filled.  

## 2012-10-18 NOTE — Op Note (Signed)
[facility] [address] [place], [address]   COLONOSCOPY PROCEDURE REPORT  PATIENT: Jose Leach, Jose Leach  MR#: [id number] BIRTHDATE: Mar 14, 1932 , 80  yrs. old GENDER: Male ENDOSCOPIST: Louis Meckel, MD REFERRED BY: PROCEDURE DATE:  10/18/2012 PROCEDURE:   Colonoscopy with cold biopsy polypectomy First Screening Colonoscopy - Avg.  risk and is 50 yrs.  old or older - No.      History of Adenoma - Now for follow-up colonoscopy & has been > or = to 3 yrs.  Yes hx of adenoma.  Has been 3 or more years since last colonoscopy.  Polyps Removed Today? Yes. ASA CLASS:   Class III INDICATIONS:Patient's personal history of adenomatous colon polyps and Patient's immediate family history of colon cancer. MEDICATIONS: MAC sedation, administered by CRNA, Fentanyl-Quick Pick, and propofol (Diprivan) 250mg  IV  DESCRIPTION OF PROCEDURE:   After the risks benefits and alternatives of the procedure were thoroughly explained, informed consent was obtained.  A digital rectal exam revealed no abnormalities of the rectum.   The LB [id number]  endoscope was introduced through the anus and advanced to the cecum, which was identified by the ileocecal valve.the cecum was fully examined but the appendiceal orifice was not seen. No adverse events experienced.   The quality of the prep was excellent using Suprep The instrument was then slowly withdrawn as the colon was fully examined.      COLON FINDINGS: A sessile polyp measuring 2 mm in size was found in the ascending colon.  A polypectomy was performed with cold forceps.   Hemorrhoids were found.   Mild diverticulosis was noted in the sigmoid colon.   The colon mucosa was otherwise normal.   In the proximal rectal vault mucosa was friable.  This corresponds to an area of previous recent stool impaction.   In the proximal rectal vault mucosa was friable.  This corresponds to an area of previous recent stool impaction.   Retroflexed views revealed no abnormalities. The time to cecum=5 minutes 23 seconds.  Withdrawal time=11 minutes 46 seconds.  The scope was withdrawn and the procedure completed. COMPLICATIONS: There were no complications.  ENDOSCOPIC IMPRESSION: 1.   colon polyp 2.  mild diverticulosis 3.  area of friable mucosa (recent stool impaction) 4.   The colon mucosa was otherwise normal  RECOMMENDATIONS: Given your age, you will not need another colonoscopy for colon cancer screening or polyp surveillance.  These types of tests usually stop around the age 49. Trial of Linzess 145ug daily OV 1 month   eSigned:  Louis Meckel, MD 10/18/2012 2:55 PM   cc: Pecola Lawless, MD   PATIENT NAME:  Jose Leach MR#: [id number]

## 2012-10-18 NOTE — Progress Notes (Signed)
Patient did not experience any of the following events: a burn prior to discharge; a fall within the facility; wrong site/side/patient/procedure/implant event; or a hospital transfer or hospital admission upon discharge from the facility. (G8907) Patient did not have preoperative order for IV antibiotic SSI prophylaxis. (G8918)  

## 2012-10-18 NOTE — Discharge Summary (Signed)
Reviewed and agree with management plan.  Davian Hanshaw T. Tallula Grindle, MD FACG 

## 2012-10-18 NOTE — Progress Notes (Signed)
Report to pacu rn, vss, bbs=clear 

## 2012-10-18 NOTE — Patient Instructions (Addendum)
Call back in one week to report progress with Linzess.   YOU HAD AN ENDOSCOPIC PROCEDURE TODAY AT THE [facility]: Refer to the procedure report that was given to you for any specific questions about what was found during the examination.  If the procedure report does not answer your questions, please call your gastroenterologist to clarify.  If you requested that your care partner not be given the details of your procedure findings, then the procedure report has been included in a sealed envelope for you to review at your convenience later.  YOU SHOULD EXPECT: Some feelings of bloating in the abdomen. Passage of more gas than usual.  Walking can help get rid of the air that was put into your GI tract during the procedure and reduce the bloating. If you had a lower endoscopy (such as a colonoscopy or flexible sigmoidoscopy) you may notice spotting of blood in your stool or on the toilet paper. If you underwent a bowel prep for your procedure, then you may not have a normal bowel movement for a few days.  DIET: Your first meal following the procedure should be a light meal and then it is ok to progress to your normal diet.  A half-sandwich or bowl of soup is an example of a good first meal.  Heavy or fried foods are harder to digest and may make you feel nauseous or bloated.  Likewise meals heavy in dairy and vegetables can cause extra gas to form and this can also increase the bloating.  Drink plenty of fluids but you should avoid alcoholic beverages for 24 hours.  ACTIVITY: Your care partner should take you home directly after the procedure.  You should plan to take it easy, moving slowly for the rest of the day.  You can resume normal activity the day after the procedure however you should NOT DRIVE or use heavy machinery for 24 hours (because of the sedation medicines used during the test).    SYMPTOMS TO REPORT IMMEDIATELY: A gastroenterologist can be reached at any hour.  During normal  business hours, 8:30 AM to 5:00 PM Monday through Friday, call [phone].  After hours and on weekends, please call the GI answering service at [phone] who will take a message and have the physician on call contact you.   Following lower endoscopy (colonoscopy or flexible sigmoidoscopy):  Excessive amounts of blood in the stool  Significant tenderness or worsening of abdominal pains  Swelling of the abdomen that is new, acute  Fever of 100F or higher  FOLLOW UP: If any biopsies were taken you will be contacted by phone or by letter within the next 1-3 weeks.  Call your gastroenterologist if you have not heard about the biopsies in 3 weeks.  Our staff will call the home number listed on your records the next business day following your procedure to check on you and address any questions or concerns that you may have at that time regarding the information given to you following your procedure. This is a courtesy call and so if there is no answer at the home number and we have not heard from you through the emergency physician on call, we will assume that you have returned to your regular daily activities without incident.  SIGNATURES/CONFIDENTIALITY: You and/or your care partner have signed paperwork which will be entered into your electronic medical record.  These signatures attest to the fact that that the information above on your After Visit Summary has been  reviewed and is understood.  Full responsibility of the confidentiality of this discharge information lies with you and/or your care-partner.  Await pathology results  Continue your current medicines- take Linzess 1 tablet daily for 1 week and then call Dr. Marzetta Board nurse with an update

## 2012-10-18 NOTE — Progress Notes (Signed)
Called to room to assist during endoscopic procedure.  Patient ID and intended procedure confirmed with present staff. Received instructions for my participation in the procedure from the performing physician.  

## 2012-10-19 ENCOUNTER — Telehealth: Payer: Self-pay | Admitting: *Deleted

## 2012-10-19 NOTE — Telephone Encounter (Signed)
No answer, message left for the patient. 

## 2012-10-23 ENCOUNTER — Encounter: Payer: Self-pay | Admitting: Internal Medicine

## 2012-10-23 ENCOUNTER — Ambulatory Visit (INDEPENDENT_AMBULATORY_CARE_PROVIDER_SITE_OTHER): Payer: Medicare Other | Admitting: Internal Medicine

## 2012-10-23 VITALS — BP 127/67 | HR 48 | Temp 98.0°F | Wt 156.0 lb

## 2012-10-23 DIAGNOSIS — L723 Sebaceous cyst: Secondary | ICD-10-CM

## 2012-10-23 DIAGNOSIS — L02818 Cutaneous abscess of other sites: Secondary | ICD-10-CM

## 2012-10-23 MED ORDER — MUPIROCIN 2 % EX OINT: TOPICAL_OINTMENT | CUTANEOUS | Status: DC

## 2012-10-23 NOTE — Patient Instructions (Addendum)
Dip gauze in  sterile saline and applied to the wound twice a day. Cover the red area with  Mucipurin antibiotic ointment. The saline can be purchased at the drugstore or you can make your own .Boil cup of salt in a gallon of water. Store mixture  in a clean container.Report Warning  signs as discussed (red streaks, pus, fever, increasing pain). Go to WebMD for information about  EPIDERMOID INCLUSION CYSTS.

## 2012-10-23 NOTE — Progress Notes (Signed)
  Subjective:    Patient ID: Jose Leach, male    DOB: Jun 22, 1932, 79 y.o.   MRN: [id number]  HPI   He has had a subcutaneous "cyst" in the left axillary area for at least a year. It may have enlarged slightly in size during that period  In the last few days he's noted it to be read and swollen. Subsequently there was a bloody discharge with some purulent material.  He has not manipulated the lesion or treated it in any form or fashion      Review of Systems  He denies fever, chills, sweats, weight loss     Objective:   Physical Exam  He appears adequately nourished and in no acute distress.  He has no lymphadenopathy about the neck or axilla.  He has no organomegaly or masses  There is an irregular slightly firm subcutaneous lesion 22 by 26 mm which is fully mobile in the anterior axillary fold on the left. There is no attachment to the subcutaneous tissues. There is localized eschar and erythema at approximately 11:00        Assessment & Plan:  #1 abscess involving epidermoid inclusion cyst left axilla  Plan: See orders and recommendations. If this becomes recurrently infected surgical resection would be indicated.

## 2012-10-25 ENCOUNTER — Encounter: Payer: Self-pay | Admitting: Gastroenterology

## 2012-10-27 ENCOUNTER — Telehealth: Payer: Self-pay | Admitting: Gastroenterology

## 2012-10-27 MED ORDER — LINACLOTIDE 145 MCG PO CAPS: 145.0000 ug | ORAL_CAPSULE | Freq: Every day | ORAL | Status: DC

## 2012-10-27 NOTE — Telephone Encounter (Signed)
Med sent to pharmacy pt aware 

## 2012-10-31 ENCOUNTER — Ambulatory Visit (INDEPENDENT_AMBULATORY_CARE_PROVIDER_SITE_OTHER): Payer: Medicare Other | Admitting: Internal Medicine

## 2012-10-31 ENCOUNTER — Encounter: Payer: Self-pay | Admitting: Internal Medicine

## 2012-10-31 VITALS — BP 146/68 | HR 50 | Temp 98.2°F | Wt 155.0 lb

## 2012-10-31 DIAGNOSIS — R609 Edema, unspecified: Secondary | ICD-10-CM

## 2012-10-31 DIAGNOSIS — R600 Localized edema: Secondary | ICD-10-CM

## 2012-10-31 NOTE — Patient Instructions (Addendum)
Ensure is a liquid protein supplement which may help decrease edema. See if this is available on your plan; I can write a prescription if required.  If you're unable to use the support hose; another option would be wrapping  the legs with Ace wrap. Finally lymphedema therapy through physical therapy would be an option; but this would be more complicated

## 2012-10-31 NOTE — Assessment & Plan Note (Signed)
If he is unable to wear the stockings employing the assist device which his wife purchased; an alternative would be an Ace wrap. Initially elevation of legs and protein supplementation daily ibuprofen

## 2012-10-31 NOTE — Progress Notes (Signed)
  Subjective:    Patient ID: Jose Leach, male    DOB: 06-11-1932, 79 y.o.   MRN: [id number]  HPI  He has had persistent edema in the left lower extremity for several months. Venous Doppler studies were completed 7/24 and 09/15/12; both studies were negative for deep venous thrombosis.  His albumin stores & hepaic function studies were normal. BUN was mildly elevated at 27 and GFR was minimally reduced at 56.66; but creatinine was normal.  Compression hose were ordered but he has been unable to wear these. His wife states that she does not have the strength to help him put these on.    Review of Systems  His wife has noted some exertional dyspnea. He's had some edema in the right leg but this is less than on the left.  He denies chest pain, help occasions, claudication, or paroxysmal nocturnal dyspnea.     Objective:   Physical Exam  Thin & suboptimally nourished ;in no acute distress  No carotid bruits are present.No neck pain distention present at 10 - 15 degrees.  Heart rhythm and rate are normal with no significant murmurs or gallops. S4  Chest is clear with no increased work of breathing  There is no evidence of aortic aneurysm or renal artery bruits  Abdomen scaphoid & soft with no organomegaly or masses. No HJR  No clubbing, cyanosis.1/2-1 +edema present.  Pedal pulses are intact  But decreased symmetrically  No ischemic skin changes are present . Eschar formation over the axillary cyst. No evidence of cellulitis or purulence  Alert and oriented. Strength, tone, DTRs reflexes normal          Assessment & Plan:  See Current Assessment & Plan in Problem List under specific Diagnosis

## 2012-11-01 ENCOUNTER — Other Ambulatory Visit: Payer: Self-pay | Admitting: Internal Medicine

## 2012-11-07 NOTE — Telephone Encounter (Deleted)
Requesting refill on Mupiroc

## 2012-11-08 ENCOUNTER — Other Ambulatory Visit: Payer: Self-pay | Admitting: Internal Medicine

## 2012-11-08 MED ORDER — MUPIROCIN 2 % EX OINT: TOPICAL_OINTMENT | CUTANEOUS | Status: DC

## 2012-11-08 NOTE — Telephone Encounter (Signed)
Rx was faxed to the pharmacy.//AB/CMA

## 2012-11-16 ENCOUNTER — Ambulatory Visit: Payer: Medicare Other | Admitting: Gastroenterology

## 2012-11-20 ENCOUNTER — Other Ambulatory Visit: Payer: Self-pay | Admitting: Internal Medicine

## 2012-11-20 NOTE — Telephone Encounter (Signed)
Lipitor refill sent to pharmacy.

## 2012-12-05 ENCOUNTER — Other Ambulatory Visit: Payer: Medicare Other

## 2012-12-05 ENCOUNTER — Other Ambulatory Visit: Payer: Self-pay | Admitting: Internal Medicine

## 2012-12-05 DIAGNOSIS — I10 Essential (primary) hypertension: Secondary | ICD-10-CM

## 2012-12-05 DIAGNOSIS — E785 Hyperlipidemia, unspecified: Secondary | ICD-10-CM

## 2012-12-06 ENCOUNTER — Ambulatory Visit (INDEPENDENT_AMBULATORY_CARE_PROVIDER_SITE_OTHER): Payer: Medicare Other | Admitting: Internal Medicine

## 2012-12-06 ENCOUNTER — Encounter: Payer: Self-pay | Admitting: Internal Medicine

## 2012-12-06 VITALS — BP 111/63 | HR 49 | Temp 98.0°F | Resp 16 | Wt 154.0 lb

## 2012-12-06 DIAGNOSIS — K4021 Bilateral inguinal hernia, without obstruction or gangrene, recurrent: Secondary | ICD-10-CM

## 2012-12-06 NOTE — Patient Instructions (Signed)
The  General Surgery referral will be scheduled and you'll be notified of the time. 

## 2012-12-06 NOTE — Progress Notes (Signed)
  Subjective:    Patient ID: Jose Leach, male    DOB: Nov 30, 1932, 79 y.o.   MRN: [id number]  HPI   He's had bilateral inguinal hernias for approximately 5 years. He had been advised not to lift more than 15 pounds.  Unfortunately his rolling walker does weigh more than that &  he must lift it into his car trunk intermittently  The hernias have been associated with pain which is intermittent, but worse with Physical Therapy for leg strengthing  For gait & falling issues and obviously worse lifting      Review of Systems  He denies fever, chills, sweats, weight loss  He has no nausea, vomiting, or diarrhea. He is on Linzess for chronic constipation from his gastroenterologist.     Objective:   Physical Exam General appearance: Thin but adequate nourishment w/o distress. Head tremor  Eyes: No conjunctival inflammation or scleral icterus is present.   Heart:  Normal rate and regular rhythm. S1 and S2 normal without gallop, murmur, click, rub or other extra sounds     Lungs:Chest clear to auscultation; no wheezes, rhonchi,rales ,or rubs present.No increased work of breathing.   Abdomen: bowel sounds normal, soft and non-tender without masses or organomegaly .  No guarding or rebound   Bilateral inguinal hernias, left larger than right. Both are reducible.  GU: large testes    Skin:Warm & dry.  Intact without suspicious lesions or rashes ; no jaundice or tenting  Lymphatic: No lymphadenopathy is noted about the head, neck, axilla.             Assessment & Plan:  #1 bilateral inguinal hernias, reducible  Plan :see orders

## 2012-12-12 ENCOUNTER — Encounter (INDEPENDENT_AMBULATORY_CARE_PROVIDER_SITE_OTHER): Payer: Self-pay | Admitting: Surgery

## 2012-12-12 ENCOUNTER — Ambulatory Visit (INDEPENDENT_AMBULATORY_CARE_PROVIDER_SITE_OTHER): Payer: Medicare Other | Admitting: Surgery

## 2012-12-12 VITALS — BP 126/78 | HR 60 | Temp 98.1°F | Resp 14 | Ht 73.0 in | Wt 155.0 lb

## 2012-12-12 DIAGNOSIS — K4021 Bilateral inguinal hernia, without obstruction or gangrene, recurrent: Secondary | ICD-10-CM

## 2012-12-12 NOTE — Patient Instructions (Signed)
See the Handout(s) we gave you.  Consider surgery.  Please call our office at [phone] if you wish to schedule surgery or if you have further questions / concerns.   Hernia A hernia occurs when an internal organ pushes out through a weak spot in the abdominal wall. Hernias most commonly occur in the groin and around the navel. Hernias often can be pushed back into place (reduced). Most hernias tend to get worse over time. Some abdominal hernias can get stuck in the opening (irreducible or incarcerated hernia) and cannot be reduced. An irreducible abdominal hernia which is tightly squeezed into the opening is at risk for impaired blood supply (strangulated hernia). A strangulated hernia is a medical emergency. Because of the risk for an irreducible or strangulated hernia, surgery may be recommended to repair a hernia. CAUSES   Heavy lifting.  Prolonged coughing.  Straining to have a bowel movement.  A cut (incision) made during an abdominal surgery. HOME CARE INSTRUCTIONS   Bed rest is not required. You may continue your normal activities.  Avoid lifting more than 10 pounds (4.5 kg) or straining.  Cough gently. If you are a smoker it is best to stop. Even the best hernia repair can break down with the continual strain of coughing. Even if you do not have your hernia repaired, a cough will continue to aggravate the problem.  Do not wear anything tight over your hernia. Do not try to keep it in with an outside bandage or truss. These can damage abdominal contents if they are trapped within the hernia sac.  Eat a normal diet.  Avoid constipation. Straining over long periods of time will increase hernia size and encourage breakdown of repairs. If you cannot do this with diet alone, stool softeners may be used. SEEK IMMEDIATE MEDICAL CARE IF:   You have a fever.  You develop increasing abdominal pain.  You feel nauseous or vomit.  Your hernia is stuck outside the abdomen, looks  discolored, feels hard, or is tender.  You have any changes in your bowel habits or in the hernia that are unusual for you.  You have increased pain or swelling around the hernia.  You cannot push the hernia back in place by applying gentle pressure while lying down. MAKE SURE YOU:   Understand these instructions.  Will watch your condition.  Will get help right away if you are not doing well or get worse. Document Released: 02/01/2005 Document Revised: 04/26/2011 Document Reviewed: 09/21/2007 [facility] Patient Information 2014 [place].  HERNIA REPAIR: POST OP INSTRUCTIONS  1. DIET: Follow a light bland diet the first 24 hours after arrival home, such as soup, liquids, crackers, etc.  Be sure to include lots of fluids daily.  Avoid fast food or heavy meals as your are more likely to get nauseated.  Eat a low fat the next few days after surgery. 2. Take your usually prescribed home medications unless otherwise directed. 3. PAIN CONTROL: a. Pain is best controlled by a usual combination of three different methods TOGETHER: i. Ice/Heat ii. Over the counter pain medication iii. Prescription pain medication b. Most patients will experience some swelling and bruising around the hernia(s) such as the bellybutton, groins, or old incisions.  Ice packs or heating pads (30-60 minutes up to 6 times a day) will help. Use ice for the first few days to help decrease swelling and bruising, then switch to heat to help relax tight/sore spots and speed recovery.  Some people prefer to  use ice alone, heat alone, alternating between ice & heat.  Experiment to what works for you.  Swelling and bruising can take several weeks to resolve.   c. It is helpful to take an over-the-counter pain medication regularly for the first few weeks.  Choose one of the following that works best for you: i. Naproxen (Aleve, etc)  Two 220mg tabs twice a day ii. Ibuprofen (Advil, etc) Three 200mg tabs four times a day  (every meal & bedtime) iii. Acetaminophen (Tylenol, etc) 325-650mg four times a day (every meal & bedtime) d. A  prescription for pain medication should be given to you upon discharge.  Take your pain medication as prescribed.  i. If you are having problems/concerns with the prescription medicine (does not control pain, nausea, vomiting, rash, itching, etc), please call us [phone] to see if we need to switch you to a different pain medicine that will work better for you and/or control your side effect better. ii. If you need a refill on your pain medication, please contact your pharmacy.  They will contact our office to request authorization. Prescriptions will not be filled after 5 pm or on week-ends. 4. Avoid getting constipated.  Between the surgery and the pain medications, it is common to experience some constipation.  Increasing fluid intake and taking a fiber supplement (such as Metamucil, Citrucel, FiberCon, MiraLax, etc) 1-2 times a day regularly will usually help prevent this problem from occurring.  A mild laxative (prune juice, Milk of Magnesia, MiraLax, etc) should be taken according to package directions if there are no bowel movements after 48 hours.   5. Wash / shower every day.  You may shower over the dressings as they are waterproof.   6. Remove your waterproof bandages 5 days after surgery.  You may leave the incision open to air.  You may replace a dressing/Band-Aid to cover the incision for comfort if you wish.  Continue to shower over incision(s) after the dressing is off.    7. ACTIVITIES as tolerated:   a. You may resume regular (light) daily activities beginning the next day-such as daily self-care, walking, climbing stairs-gradually increasing activities as tolerated.  If you can walk 30 minutes without difficulty, it is safe to try more intense activity such as jogging, treadmill, bicycling, low-impact aerobics, swimming, etc. b. Save the most intensive and strenuous  activity for last such as sit-ups, heavy lifting, contact sports, etc  Refrain from any heavy lifting or straining until you are off narcotics for pain control.   c. DO NOT PUSH THROUGH PAIN.  Let pain be your guide: If it hurts to do something, don't do it.  Pain is your body warning you to avoid that activity for another week until the pain goes down. d. You may drive when you are no longer taking prescription pain medication, you can comfortably wear a seatbelt, and you can safely maneuver your car and apply brakes. e. You may have sexual intercourse when it is comfortable.  8. FOLLOW UP in our office a. Please call [facility] at [phone] to set up an appointment to see your surgeon in the office for a follow-up appointment approximately 2-3 weeks after your surgery. b. Make sure that you call for this appointment the day you arrive home to insure a convenient appointment time. 9.  IF YOU HAVE DISABILITY OR FAMILY LEAVE FORMS, BRING THEM TO THE OFFICE FOR PROCESSING.  DO NOT GIVE THEM TO YOUR DOCTOR.  WHEN TO CALL US (  [phone]) [phone]: 1. Poor pain control 2. Reactions / problems with new medications (rash/itching, nausea, etc)  3. Fever over 101.5 F (38.5 C) 4. Inability to urinate 5. Nausea and/or vomiting 6. Worsening swelling or bruising 7. Continued bleeding from incision. 8. Increased pain, redness, or drainage from the incision   The clinic staff is available to answer your questions during regular business hours (8:30am-5pm).  Please don't hesitate to call and ask to speak to one of our nurses for clinical concerns.   If you have a medical emergency, go to the nearest emergency room or call 911.  A surgeon from [facility] [place] [facility] is always on call at the hospitals in [facility], [place] [address], [address], [place], [place]  [address] ?  [address] [address], [place], [place]   [address] MAIN: [phone] ? TOLL FREE: [phone] ? FAX: [phone]  [phone] [email or web address]  GETTING TO GOOD BOWEL HEALTH. Irregular bowel habits such as constipation and diarrhea can lead to many problems over time.  Having one soft bowel movement a day is the most important way to prevent further problems.  The anorectal canal is designed to handle stretching and feces to safely manage our ability to get rid of solid waste (feces, poop, stool) out of our body.  BUT, hard constipated stools can act like ripping concrete bricks and diarrhea can be a burning fire to this very sensitive area of our body, causing inflamed hemorrhoids, anal fissures, increasing risk is perirectal abscesses, abdominal pain/bloating, an making irritable bowel worse.     The goal: ONE SOFT BOWEL MOVEMENT A DAY!  To have soft, regular bowel movements:    Drink at least 8 tall glasses of water a day.     Take plenty of fiber.  Fiber is the undigested part of plant food that passes into the colon, acting s "natures broom" to encourage bowel motility and movement.  Fiber can absorb and hold large amounts of water. This results in a larger, bulkier stool, which is soft and easier to pass. Work gradually over several weeks up to 6 servings a day of fiber (25g a day even more if needed) in the form of: o Vegetables -- Root (potatoes, carrots, turnips), leafy green (lettuce, salad greens, celery, spinach), or cooked high residue (cabbage, broccoli, etc) o Fruit -- Fresh (unpeeled skin & pulp), Dried (prunes, apricots, cherries, etc ),  or stewed ( applesauce)  o Whole grain breads, pasta, etc (whole wheat)  o Bran cereals    Bulking Agents -- This type of water-retaining fiber generally is easily obtained each day by one of the following:  o Psyllium bran -- The psyllium plant is remarkable because its ground seeds can retain so much water. This product is available as Metamucil, Konsyl, Effersyllium, Per Diem Fiber, or the less expensive generic preparation in drug and health food  stores. Although labeled a laxative, it really is not a laxative.  o Methylcellulose -- This is another fiber derived from wood which also retains water. It is available as Citrucel. o Polyethylene Glycol - and "artificial" fiber commonly called Miralax or Glycolax.  It is helpful for people with gassy or bloated feelings with regular fiber o Flax Seed - a less gassy fiber than psyllium   No reading or other relaxing activity while on the toilet. If bowel movements take longer than 5 minutes, you are too constipated   AVOID CONSTIPATION.  High fiber and water intake usually takes care of  this.  Sometimes a laxative is needed to stimulate more frequent bowel movements, but    Laxatives are not a good long-term solution as it can wear the colon out. o Osmotics (Milk of Magnesia, Fleets phosphosoda, Magnesium citrate, MiraLax, GoLytely) are safer than  o Stimulants (Senokot, Castor Oil, Dulcolax, Ex Lax)    o Do not take laxatives for more than 7days in a row.    IF SEVERELY CONSTIPATED, try a Bowel Retraining Program: o Do not use laxatives.  o Eat a diet high in roughage, such as bran cereals and leafy vegetables.  o Drink six (6) ounces of prune or apricot juice each morning.  o Eat two (2) large servings of stewed fruit each day.  o Take one (1) heaping tablespoon of a psyllium-based bulking agent twice a day. Use sugar-free sweetener when possible to avoid excessive calories.  o Eat a normal breakfast.  o Set aside 15 minutes after breakfast to sit on the toilet, but do not strain to have a bowel movement.  o If you do not have a bowel movement by the third day, use an enema and repeat the above steps.    Controlling diarrhea o Switch to liquids and simpler foods for a few days to avoid stressing your intestines further. o Avoid dairy products (especially milk & ice cream) for a short time.  The intestines often can lose the ability to digest lactose when stressed. o Avoid foods that cause  gassiness or bloating.  Typical foods include beans and other legumes, cabbage, broccoli, and dairy foods.  Every person has some sensitivity to other foods, so listen to our body and avoid those foods that trigger problems for you. o Adding fiber (Citrucel, Metamucil, psyllium, Miralax) gradually can help thicken stools by absorbing excess fluid and retrain the intestines to act more normally.  Slowly increase the dose over a few weeks.  Too much fiber too soon can backfire and cause cramping & bloating. o Probiotics (such as active yogurt, Align, etc) may help repopulate the intestines and colon with normal bacteria and calm down a sensitive digestive tract.  Most studies show it to be of mild help, though, and such products can be costly. o Medicines:   Bismuth subsalicylate (ex. Kayopectate, Pepto Bismol) every 30 minutes for up to 6 doses can help control diarrhea.  Avoid if pregnant.   Loperamide (Immodium) can slow down diarrhea.  Start with two tablets (4mg  total) first and then try one tablet every 6 hours.  Avoid if you are having fevers or severe pain.  If you are not better or start feeling worse, stop all medicines and call your doctor for advice o Call your doctor if you are getting worse or not better.  Sometimes further testing (cultures, endoscopy, X-ray studies, bloodwork, etc) may be needed to help diagnose and treat the cause of the diarrhea. o

## 2012-12-12 NOTE — Progress Notes (Signed)
Subjective:     Patient ID: Jose Leach, male   DOB: [birth year], 79 y.o.   MRN: [id number]  HPI  [name]  1932-07-07 [id number]  Patient Care Team: Pecola Lawless, MD as PCP - General (Internal Medicine)  This patient is a 79 y.o.male who presents today for surgical evaluation at the request of Dr. Alwyn Ren.   Reason for visit: Symptomatic inguinal hernias.  Nodule left armpit  Pleasant elderly male.  And uses a rolling walker for balance and for some lower extra any neuralgias.  Has had hernias in his groin for the past two years.  They have gotten much more bothersome.  Painful to lift up his walker.  Worsening pain when he is out shopping and has to stop.  Concerning.  His primary care physician recommended surgical evaluation.  Also noted is a mass in his left armpit most likely a cyst.  It has gotten infected in the past.  Not now.  Concerned if that needed to be addressed as well.  Does have some issues with severe constipation.  Required recent fecal disimpaction.  On a bowel regimen and improved.  No fevers or chills.  Had an appendectomy but no other abdominal surgeries.  No history of cardiac disease or pulmonary disease.  His blood thinners.  No history of MRSA  Patient Active Problem List   Diagnosis Date Noted  . Inguinal hernia recurrent bilateral 12/06/2012  . Fecal impaction 10/11/2012  . Bilateral leg edema 09/13/2012  . HTN (hypertension) 08/22/2012  . Unspecified vitamin D deficiency 08/06/2012  . Weakness 08/02/2012  . Bradycardia, sinus 08/02/2012  . Hyperlipidemia   . Symptomatic sinus bradycardia 07/30/2012  . Chronic constipation 07/24/2012  . Family history of colon cancer 07/24/2012  . Open wound of heel 07/04/2012  . Hx of falling 07/19/2011  . Difficulty in walking 04/05/2011  . Balance disorder 04/05/2011  . HAMMER TOE 04/23/2010  . ABNORMAL ELECTROCARDIOGRAM 04/16/2009  . MORTON'S NEUROMA, LEFT 08/22/2007  . ANXIETY 06/07/2007  .  ARTHRITIS 06/07/2007  . CAROTID ARTERY DISEASE 03/31/2007  . PERIPHERAL VASCULAR DISEASE 03/16/2007  . HYPOTHYROIDISM 11/10/2006  . HYPERLIPIDEMIA 11/10/2006  . MIGRAINE HEADACHE 03/14/2006  . PREMATURE ATRIAL CONTRACTIONS 03/14/2006  . SPINAL STENOSIS 03/14/2006  . ESOPHAGITIS 08/06/2002    Past Medical History  Diagnosis Date  . Migraines     Dr [name]  . Peripheral neuropathy   . Benign essential tremor   . Cervical stenosis of spine   . Anemia     B12 deficiency  . Testosterone deficiency     Dr [name], [facility]  . Hypothyroidism   . Hyperlipidemia   . Depression     Dr [name]  . History of shingles 2009  . H/O cardiovascular stress test     a. 02/2003 -> no ischemia/infarct.  Marland Kitchen Hx of echocardiogram     Echocardiogram 08/01/12: Mild LVH, EF 55-60%, normal wall motion.  . Sinus bradycardia   . PAD (peripheral artery disease)   . Carotid stenosis     s/p L CEA    Past Surgical History  Procedure Laterality Date  . Appendectomy  1941  . Lumbar laminectomy    . Carotid endarterectomy       L  . Tonsillectomy and adenoidectomy    . Total hip arthroplasty      L  . Cataract extraction Right   . Eye surgery Right 02/2012    retina    History   Social History  .  Marital Status: Married    Spouse Name: Alvino Chapel    Number of Children: 2  . Years of Education: N/A   Occupational History  . retired    Social History Main Topics  . Smoking status: Former Smoker    Types: Pipe, Cigars    Quit date: 02/16/1979  . Smokeless tobacco: Never Used     Comment: Quit about age 38   . Alcohol Use: 4.2 oz/week    7 Glasses of wine per week     Comment:    . Drug Use: No  . Sexual Activity: No   Other Topics Concern  . Not on file   Social History Narrative  . No narrative on file    Family History  Problem Relation Age of Onset  . Stroke Mother 57  . Hypertension Mother   . Diabetes Mother   . Rectal cancer Father 81  . Heart disease Father     in 3s    . Cancer Father     colorectal  . Nephritis Sister   . Seizures Brother     died as child    Current Outpatient Prescriptions  Medication Sig Dispense Refill  . aspirin 81 MG tablet Take 81 mg by mouth daily.      Marland Kitchen atorvastatin (LIPITOR) 20 MG tablet TAKE 1 TABLET BY MOUTH EVERY DAY AS DIRECTED  30 tablet  5  . b complex vitamins capsule Take 1 capsule by mouth daily.      . bimatoprost (LUMIGAN) 0.01 % SOLN Place 1 drop into both eyes at bedtime.      . brimonidine (ALPHAGAN) 0.15 % ophthalmic solution Place 1 drop into the right eye 2 (two) times daily.       Marland Kitchen buPROPion (WELLBUTRIN XL) 300 MG 24 hr tablet Take 300 mg by mouth daily.      . Cholecalciferol (VITAMIN D3) 2000 UNITS TABS Take 4,000 Units by mouth daily.       . Flaxseed, Linseed, (FLAX SEEDS PO) Take 1 tablet by mouth daily.       [name] Oil 500 MG CAPS Take 500 mg by mouth daily.       Marland Kitchen levothyroxine (SYNTHROID, LEVOTHROID) 50 MCG tablet Take 50 mcg by mouth daily before breakfast. 1/2 on Sunday; 1 all other days      . Linaclotide (LINZESS) 145 MCG CAPS capsule Take 1 capsule (145 mcg total) by mouth daily.  30 capsule  3  . lisinopril (PRINIVIL) 10 MG tablet Take 1 tablet (10 mg total) by mouth daily.  30 tablet  6  . mirtazapine (REMERON) 45 MG tablet Take 45 mg by mouth at bedtime.      . Multiple Vitamins-Minerals (ICAPS PO) Take 1 capsule by mouth 2 (two) times daily.       . mupirocin ointment (BACTROBAN) 2 % Applied twice a day to the affected area;NOT into eyes.  22 g  0  . polyethylene glycol (MIRALAX / GLYCOLAX) packet 17 g. 3 capfuls daily      . prednisoLONE acetate (PRED FORTE) 1 % ophthalmic suspension Place 1 drop into the right eye 3 (three) times daily.      . sertraline (ZOLOFT) 100 MG tablet Take 200 mg by mouth daily.       . tamsulosin (FLOMAX) 0.4 MG CAPS capsule Take 0.4 mg by mouth daily.      Marland Kitchen testosterone ([name]) 50 MG/5GM GEL Place 5 g onto the skin daily.      Marland Kitchen  topiramate (TOPAMAX) 25  MG tablet Take 25-50 mg by mouth 2 (two) times daily. 25 mg in the a.m. And 50 mg in the p.m.      Marland Kitchen vitamin B-12 (CYANOCOBALAMIN) 1000 MCG tablet Take 1,000 mcg by mouth daily.       No current facility-administered medications for this visit.     Allergies  Allergen Reactions  . Latex     REACTION: rash from adhesive  . Tape Rash    Paper tape is ok to use     BP 126/78  Pulse 60  Temp(Src) 98.1 F (36.7 C) (Temporal)  Resp 14  Ht 6\' 1"  (1.854 m)  Wt 155 lb (70.308 kg)  BMI 20.45 kg/m2  No results found.   Review of Systems  Constitutional: Positive for activity change. Negative for fever, chills, diaphoresis and appetite change.  HENT: Negative for ear discharge, facial swelling, mouth sores, nosebleeds, sore throat and trouble swallowing.   Eyes: Negative for photophobia, discharge and visual disturbance.  Respiratory: Negative for choking, chest tightness, shortness of breath and stridor.   Cardiovascular: Negative for chest pain and palpitations.  Gastrointestinal: Negative for nausea, vomiting, abdominal pain, diarrhea, constipation, blood in stool, abdominal distention, anal bleeding and rectal pain.  Endocrine: Negative for cold intolerance and heat intolerance.  Genitourinary: Negative for dysuria, urgency, difficulty urinating and testicular pain.  Musculoskeletal: Positive for arthralgias and gait problem. Negative for back pain.  Skin: Negative for color change, pallor, rash and wound.  Allergic/Immunologic: Negative for environmental allergies and food allergies.  Neurological: Positive for dizziness. Negative for speech difficulty, weakness, numbness and headaches.  Hematological: Negative for adenopathy. Does not bruise/bleed easily.  Psychiatric/Behavioral: Negative for hallucinations, confusion and agitation.       Objective:   Physical Exam  Constitutional: He is oriented to person, place, and time. He appears well-developed and well-nourished. No  distress.  HENT:  Head: Normocephalic.  Mouth/Throat: Oropharynx is clear and moist. No oropharyngeal exudate.  Eyes: Conjunctivae and EOM are normal. Pupils are equal, round, and reactive to light. No scleral icterus.  Wears glasses  Neck: Normal range of motion. Neck supple. No tracheal deviation present.  Cardiovascular: Normal rate, regular rhythm and intact distal pulses.   Pulmonary/Chest: Effort normal and breath sounds normal. No respiratory distress.  Abdominal: Soft. He exhibits no distension. There is no tenderness. A hernia is present. Hernia confirmed positive in the right inguinal area and confirmed positive in the left inguinal area.    Musculoskeletal: Normal range of motion. He exhibits no tenderness.  Uses walker for balance.  Walks 20 minutes without difficulty.  Lymphadenopathy:    He has no cervical adenopathy.       Right: No inguinal adenopathy present.       Left: No inguinal adenopathy present.  Neurological: He is alert and oriented to person, place, and time. No cranial nerve deficit. He exhibits normal muscle tone. Coordination normal.  Skin: Skin is warm and dry. No rash noted. He is not diaphoretic. No erythema. No pallor.  Psychiatric: He has a normal mood and affect. His speech is normal. Judgment and thought content normal. He is slowed. He does not exhibit a depressed mood.       Assessment:     Active male with bilateral inguinal hernias affecting quality of life     Plan:     While the hernias are not large, they are sensitive.  It is affecting his ability to do day-to-day activities.  He  does have complex health issues but they are stable & no major cardiac issues.  I would go ahead and remove the cyst out of his left axilla since it has gotten infected in the pastand to avoid any problem with infections later on.  If he gets a recurrent infection, I would treat the axilla first before proceeding with hernia repair.  Another option is just  observation only, but he feels like it is not getting in the way of his life.  His wife is concerned about the risk of an emergent surgery needed.  He and his wife are interested in proceeding with surgery:  The anatomy & physiology of the abdominal wall and pelvic floor was discussed.  The pathophysiology of hernias in the inguinal and pelvic region was discussed.  Natural history risks such as progressive enlargement, pain, incarceration & strangulation was discussed.   Contributors to complications such as smoking, obesity, diabetes, prior surgery, etc were discussed.    I feel the risks of no intervention will lead to serious problems that outweigh the operative risks; therefore, I recommended surgery to reduce and repair the hernia.  I explained laparoscopic techniques with possible need for an open approach.  I noted usual use of mesh to patch and/or buttress hernia repair  Risks such as bleeding, infection, abscess, need for further treatment, heart attack, death, and other risks were discussed.  I noted a good likelihood this will help address the problem.   Goals of post-operative recovery were discussed as well.  Possibility that this will not correct all symptoms was explained.  I stressed the importance of low-impact activity, aggressive pain control, avoiding constipation, & not pushing through pain to minimize risk of post-operative chronic pain or injury. Possibility of reherniation was discussed.  We will work to minimize complications.     The pathophysiology of skin & subcutaneous masses was discussed.  Natural history risks without surgery were discussed.  I recommended surgery to remove the mass.  I explained the technique of removal with use of local anesthesia & possible need for more aggressive sedation/anesthesia for patient comfort.    Risks such as bleeding, infection, heart attack, death, and other risks were discussed.  I noted a good likelihood this will help address the  problem.   Possibility that this will not correct all symptoms was explained. Possibility of regrowth/recurrence of the mass was discussed.  We will work to minimize complications. Questions were answered.  The patient expresses understanding & wishes to proceed with surgery.

## 2012-12-14 ENCOUNTER — Ambulatory Visit (INDEPENDENT_AMBULATORY_CARE_PROVIDER_SITE_OTHER): Payer: Medicare Other | Admitting: Physician Assistant

## 2012-12-14 ENCOUNTER — Encounter: Payer: Self-pay | Admitting: Physician Assistant

## 2012-12-14 ENCOUNTER — Telehealth: Payer: Self-pay | Admitting: Gastroenterology

## 2012-12-14 VITALS — BP 146/72 | HR 60 | Ht 72.75 in | Wt 156.4 lb

## 2012-12-14 DIAGNOSIS — K5900 Constipation, unspecified: Secondary | ICD-10-CM

## 2012-12-14 MED ORDER — LINACLOTIDE 290 MCG PO CAPS: 290.0000 ug | ORAL_CAPSULE | Freq: Every day | ORAL | Status: DC

## 2012-12-14 MED ORDER — MOVIPREP 100 G PO SOLR: 1.0000 | Freq: Once | ORAL | Status: DC

## 2012-12-14 NOTE — Progress Notes (Signed)
Subjective:    Patient ID: Jose Leach, male    DOB: 10/10/1932, 79 y.o.   MRN: [id number]  HPI  Nikolaos is a very nice elderly white male known to Dr. Arlyce Dice. He has history of spinal stenosis, peripheral neuropathy, hypothyroidism hyperlipidemia depression peripheral arterial disease and from a GI standpoint has history of adenomatous colon polyps and chronic constipation. He was seen in August of 2014 and had a fecal impaction at that time this was attempted be managed as an outpatient in and he required an admission for disimpaction. On discharge she was on 3 doses of MiraLax daily and then switched to Linzess 145 mcg daily. Over the past 2 months he has been taking Linzess and says it has been working fairly well and he has been having a bowel movement almost every day. He did undergo colonoscopy in September 2014 as well showing a 2 mm polyp in the ascending colon which was a tubular adenoma multiple takes in the sigmoid colon and some friability in the proximal rectal vault consistent with prior impaction. Patient called in today stating that he had not had a bowel movement for 3 or 4 days and was worried about getting rid impacted. He does complain of some mild bilateral lower abdominal discomfort which he thinks comes from his hernias and also has had some fecal urgency and rectal pressure. He has not tried any additional measures at home including MiraLax or suppository.    Review of Systems  HENT: Negative.   Eyes: Negative.   Respiratory: Negative.   Gastrointestinal: Positive for rectal pain.  Endocrine: Negative.   Genitourinary: Negative.   Musculoskeletal: Positive for gait problem.  Skin: Negative.   Allergic/Immunologic: Negative.   Neurological: Positive for weakness.  Hematological: Negative.   Psychiatric/Behavioral: Negative.    Outpatient Prescriptions Prior to Visit  Medication Sig Dispense Refill  . aspirin 81 MG tablet Take 81 mg by mouth daily.      Marland Kitchen  atorvastatin (LIPITOR) 20 MG tablet TAKE 1 TABLET BY MOUTH EVERY DAY AS DIRECTED  30 tablet  5  . b complex vitamins capsule Take 1 capsule by mouth daily.      . bimatoprost (LUMIGAN) 0.01 % SOLN Place 1 drop into both eyes at bedtime.      . brimonidine (ALPHAGAN) 0.15 % ophthalmic solution Place 1 drop into the right eye 2 (two) times daily.       Marland Kitchen buPROPion (WELLBUTRIN XL) 300 MG 24 hr tablet Take 300 mg by mouth daily.      . Cholecalciferol (VITAMIN D3) 2000 UNITS TABS Take 4,000 Units by mouth daily.       . Flaxseed, Linseed, (FLAX SEEDS PO) Take 1 tablet by mouth daily.       [name] Oil 500 MG CAPS Take 500 mg by mouth daily.       Marland Kitchen levothyroxine (SYNTHROID, LEVOTHROID) 50 MCG tablet Take 50 mcg by mouth daily before breakfast. 1/2 on Sunday; 1 all other days      . Linaclotide (LINZESS) 145 MCG CAPS capsule Take 1 capsule (145 mcg total) by mouth daily.  30 capsule  3  . lisinopril (PRINIVIL) 10 MG tablet Take 1 tablet (10 mg total) by mouth daily.  30 tablet  6  . mirtazapine (REMERON) 45 MG tablet Take 45 mg by mouth at bedtime.      . Multiple Vitamins-Minerals (ICAPS PO) Take 1 capsule by mouth 2 (two) times daily.       Marland Kitchen  mupirocin ointment (BACTROBAN) 2 % Applied twice a day to the affected area;NOT into eyes.  22 g  0  . polyethylene glycol (MIRALAX / GLYCOLAX) packet 17 g. 3 capfuls daily      . prednisoLONE acetate (PRED FORTE) 1 % ophthalmic suspension Place 1 drop into the right eye 3 (three) times daily.      . sertraline (ZOLOFT) 100 MG tablet Take 200 mg by mouth daily.       . tamsulosin (FLOMAX) 0.4 MG CAPS capsule Take 0.4 mg by mouth daily.      Marland Kitchen testosterone ([name]) 50 MG/5GM GEL Place 5 g onto the skin daily.      Marland Kitchen topiramate (TOPAMAX) 25 MG tablet Take 25-50 mg by mouth 2 (two) times daily. 25 mg in the a.m. And 50 mg in the p.m.      Marland Kitchen vitamin B-12 (CYANOCOBALAMIN) 1000 MCG tablet Take 1,000 mcg by mouth daily.       No facility-administered medications  prior to visit.   Allergies  Allergen Reactions  . Latex     REACTION: rash from adhesive  . Tape Rash    Paper tape is ok to use    Patient Active Problem List   Diagnosis Date Noted  . Inguinal hernia recurrent bilateral 12/06/2012  . Fecal impaction 10/11/2012  . Bilateral leg edema 09/13/2012  . HTN (hypertension) 08/22/2012  . Unspecified vitamin D deficiency 08/06/2012  . Weakness 08/02/2012  . Bradycardia, sinus 08/02/2012  . Hyperlipidemia   . Symptomatic sinus bradycardia 07/30/2012  . Chronic constipation 07/24/2012  . Family history of colon cancer 07/24/2012  . Open wound of heel 07/04/2012  . Hx of falling 07/19/2011  . Difficulty in walking 04/05/2011  . Balance disorder 04/05/2011  . HAMMER TOE 04/23/2010  . ABNORMAL ELECTROCARDIOGRAM 04/16/2009  . MORTON'S NEUROMA, LEFT 08/22/2007  . ANXIETY 06/07/2007  . ARTHRITIS 06/07/2007  . CAROTID ARTERY DISEASE 03/31/2007  . PERIPHERAL VASCULAR DISEASE 03/16/2007  . HYPOTHYROIDISM 11/10/2006  . HYPERLIPIDEMIA 11/10/2006  . MIGRAINE HEADACHE 03/14/2006  . PREMATURE ATRIAL CONTRACTIONS 03/14/2006  . SPINAL STENOSIS 03/14/2006  . ESOPHAGITIS 08/06/2002   History  Substance Use Topics  . Smoking status: Former Smoker    Types: Pipe, Cigars    Quit date: 02/16/1979  . Smokeless tobacco: Never Used     Comment: Quit about age 33   . Alcohol Use: 4.2 oz/week    7 Glasses of wine per week     Comment:     family history includes Cancer in his father; Diabetes in his mother; Heart disease in his father; Hypertension in his mother; Nephritis in his sister; Rectal cancer (age of onset: 62) in his father; Seizures in his brother; Stroke (age of onset: 30) in his mother.     Objective:   Physical Exam  well-developed elderly white male in no acute distress, he ambulates with a walker, accompanied by his wife blood pressure 146/72 pulse 60 height 6 foot weight 156. HEENT; nontraumatic normocephalic EOMI PERRLA sclera  anicteric, Supple ;no JVD, Cardiovascular; regular rate and rhythm with S1-S2 no murmur or gallop, Pulmonary; clear bilaterally, Abdomen; soft nondistended basically nontender there is no palpable mass or hepatosplenomegaly bowel sounds are active there is a bilateral anal hernias, Rectal ;exam there is a lot of formed stool in the rectum but not a hard impaction., Extremities; no clubbing cyanosis or edema skin warm and dry, Psych mood and affect normal and appropriate  Assessment & Plan:  #41  79 year old male with chronic constipation and recurrent obstipation despite Linzess on a daily basis- he may have an early impaction . #2 other problems as outlined above #3 history of adenomatous colon polyps-last colonoscopy September 2014  Plan; we'll increase Linzess to  290 mcg daily he was given samples today to get started on We will also purge his bowel with a movie prep over the next 24 hours He is asked to use a Dulcolax suppository this evening before starting the prep and use another suppository in a.m. He will call in 24 hours with a progress report and if he is not able to purge his bowel may require observation admission for disimpaction Also discussedwith patient and his wife measures to  prevent impaction He is asked that if he does not have a bowel movement by the second day to take at least 2 doses of MiraLax on that second day and continue 2 doses of MiraLax daily along with the Linzess until his bowels are moving well.

## 2012-12-14 NOTE — Progress Notes (Signed)
Reviewed and agree with management. Franz Svec D. Shahara Hartsfield, M.D., FACG  

## 2012-12-14 NOTE — Patient Instructions (Signed)
You have been given samples of Linzess - take one capsule 30 minutes before your first meal of the day.  Use 1/2 of the Moviprep tonight and 1/2 in the morning.  Use a Ducolax suppository before starting the prep  Call our office around lunchtime tomorrow and ask to speak to [facility], to give [place] a progress report.

## 2012-12-14 NOTE — Telephone Encounter (Signed)
Pt takes linzess and states it had been working well. The last 3 days he has not had a BM and feels like he needs to. Pt scheduled to see Mike Gip PA today at 2pm. Pt aware of appt.

## 2012-12-15 ENCOUNTER — Telehealth: Payer: Self-pay | Admitting: Gastroenterology

## 2012-12-15 ENCOUNTER — Telehealth: Payer: Self-pay

## 2012-12-15 NOTE — Telephone Encounter (Signed)
Pt states he did go to the bathroom last night with the prep but he does not think he emptied completely. Pt states he is taking the second dose this morning. Asked pt if he used the dulcolax supp and he states he forgot to do them. Reviewed with him that he can use the supp this am also. He verbalized understanding.

## 2012-12-15 NOTE — Telephone Encounter (Signed)
Pt called to let us know that he is having diarrhea now. Instructed him to follow the future instructions that were given to him at [facility] yesterday and to call if any further problems.

## 2012-12-19 ENCOUNTER — Other Ambulatory Visit: Payer: Self-pay | Admitting: Neurology

## 2012-12-19 MED ORDER — TOPIRAMATE 25 MG PO TABS: 25.0000 mg | ORAL_TABLET | Freq: Two times a day (BID) | ORAL | Status: DC

## 2012-12-20 ENCOUNTER — Other Ambulatory Visit: Payer: Self-pay | Admitting: *Deleted

## 2012-12-20 MED ORDER — LEVOTHYROXINE SODIUM 50 MCG PO TABS: 50.0000 ug | ORAL_TABLET | Freq: Every day | ORAL | Status: DC

## 2012-12-20 MED ORDER — ATORVASTATIN CALCIUM 20 MG PO TABS: ORAL_TABLET | ORAL | Status: DC

## 2012-12-20 NOTE — Telephone Encounter (Signed)
Atorvastatin and Levothyroxine refills sent to pharmacy. #90, 1 refill for each

## 2012-12-21 ENCOUNTER — Other Ambulatory Visit: Payer: Self-pay

## 2012-12-24 ENCOUNTER — Other Ambulatory Visit: Payer: Self-pay | Admitting: Neurology

## 2013-01-02 ENCOUNTER — Encounter (HOSPITAL_COMMUNITY): Payer: Self-pay | Admitting: Pharmacy Technician

## 2013-01-05 ENCOUNTER — Encounter: Payer: Self-pay | Admitting: Gastroenterology

## 2013-01-05 ENCOUNTER — Ambulatory Visit (INDEPENDENT_AMBULATORY_CARE_PROVIDER_SITE_OTHER): Payer: Medicare Other | Admitting: Gastroenterology

## 2013-01-05 VITALS — BP 76/50 | HR 60 | Ht 72.75 in | Wt 155.5 lb

## 2013-01-05 DIAGNOSIS — Z860100 Personal history of colon polyps, unspecified: Secondary | ICD-10-CM

## 2013-01-05 DIAGNOSIS — K5909 Other constipation: Secondary | ICD-10-CM

## 2013-01-05 DIAGNOSIS — K5900 Constipation, unspecified: Secondary | ICD-10-CM

## 2013-01-05 HISTORY — DX: Personal history of colon polyps, unspecified: Z86.0100

## 2013-01-05 HISTORY — DX: Personal history of colonic polyps: Z86.010

## 2013-01-05 NOTE — Assessment & Plan Note (Signed)
Routine followup colonoscopy was not recommended in view of the patient's age and comorbidities

## 2013-01-05 NOTE — Progress Notes (Signed)
History of Present Illness:  The patient has returned for followup of constipation.  On a regimen of high-dose Linzess (290ug) daily he is having a daily bowel movement.  Stools are still hard.    Review of Systems: Pertinent positive and negative review of systems were noted in the above HPI section. All other review of systems were otherwise negative.    Current Medications, Allergies, Past Medical History, Past Surgical History, Family History and Social History were reviewed in [facility] electronic medical record  Vital signs were reviewed in today's medical record. Physical Exam: General: Elderly male in no acute distress

## 2013-01-05 NOTE — Assessment & Plan Note (Signed)
Patient seems to be doing well on a regimen of high-dose Linzess.  Because of the hard stools I have instructed him to take a daily stool softener.  He was also instructed to use MiraLax, 2 doses, if he goes 2 days without a bowel movement.

## 2013-01-05 NOTE — Patient Instructions (Signed)
Use Linzss daily Use stool softner daily If no Bowel Movement by day 3 use 2 doses of Miralax

## 2013-01-08 NOTE — Pre-Procedure Instructions (Signed)
Jose Leach  01/08/2013   Your procedure is scheduled on:  Tuesday, December 2nd.  Report to [facility], Main Entrance [facility] "[facility]" at 5:30 AM.  Call this number if you have problems the morning of surgery: [phone]   Remember:   Do not eat food or drink liquids after midnight.   Take these medicines the morning of surgery with A SIP OF WATER: Topiramate (Topamax), Sertraline (Zoloft), Levothyroxine (Synthroid), Bupropion (Wellburtin).    Take if needed: Clonazepam (Klonopin).  Stop Aspirin, Vitamins, Herbal medication,and Krill oil 5 days prior to surgery 01/11/13.   Do not wear jewelry, make-up or nail polish.  Do not wear lotions, powders, or perfumes. You may wear deodorant.  Men may shave face and neck.  Do not bring valuables to the hospital.  [facility] is not responsible  for any belongings or valuables.             Contacts, dentures or bridgework may not be worn into surgery.  Leave suitcase in the car. After surgery it may be brought to your room.  For patients admitted to the hospital, discharge time is determined by your  treatment team.               Patients discharged the day of surgery will not be allowed to drive home.  Name and phone number of your driver: -   Special Instructions: Shower using CHG 2 nights before surgery and the night before surgery.  If you shower the day of surgery use CHG.  Use special wash - you have one bottle of CHG for all showers.  You should use approximately 1/3 of the bottle for each shower.   Please read over the following fact sheets that you were given: Pain Booklet, Coughing and Deep Breathing and Surgical Site Infection Prevention

## 2013-01-09 ENCOUNTER — Encounter (HOSPITAL_COMMUNITY): Payer: Self-pay

## 2013-01-09 ENCOUNTER — Encounter (HOSPITAL_COMMUNITY)
Admission: RE | Admit: 2013-01-09 | Discharge: 2013-01-09 | Disposition: A | Discharge status: Home / Self Care | Payer: Medicare Other | Source: Ambulatory Visit | Attending: Surgery | Admitting: Surgery

## 2013-01-09 DIAGNOSIS — Z01818 Encounter for other preprocedural examination: Secondary | ICD-10-CM | POA: Insufficient documentation

## 2013-01-09 DIAGNOSIS — Z01812 Encounter for preprocedural laboratory examination: Secondary | ICD-10-CM | POA: Insufficient documentation

## 2013-01-09 LAB — CBC
MCV: 93.4 fL (ref 78.0–100.0)
Platelets: 130 10*3/uL — ABNORMAL LOW (ref 150–400)
RBC: 4.56 MIL/uL (ref 4.22–5.81)
WBC: 6.2 10*3/uL (ref 4.0–10.5)

## 2013-01-09 LAB — BASIC METABOLIC PANEL
CO2: 25 mEq/L (ref 19–32)
Chloride: 109 mEq/L (ref 96–112)
Creatinine, Ser: 1.31 mg/dL (ref 0.50–1.35)
Potassium: 4.1 mEq/L (ref 3.5–5.1)
Sodium: 143 mEq/L (ref 135–145)

## 2013-01-09 NOTE — Progress Notes (Signed)
Anesthesia Chart Review:  Patient is a 79 year old male scheduled for laparoscopic exploration and repair of bilateral inguinal hernias, removal of mass in left axilla on 01/16/13 by Dr. Michaell Cowing.  History includes former smoker, bradycardia, PAD/carotid occlusive disease s/p left CEA '07, benign essential tremor, peripheral neuropathy, migraines, low T, HLD, anemia/B12 deficiency, hypothyroidism, depression, migraines, left THA, T&A, lumbar laminectomy, severe constipation (see Dr. Arlyce Dice).  PCP is Dr. [name] who referred patient to [facility]. Cardiologist is Dr. Tonny Bollman, last visit with Tereso Newcomer, PA-C on 09/21/12. Patient was asymptomatic of his bradycardia, so no indication for PPM at that time.  EKG on 09/21/12 showed SB @ 58 bpm.  Echo on 08/01/12 showed: Left ventricle: The cavity size was normal. There was mild concentric hypertrophy. Systolic function was normal. The estimated ejection fraction was in the range of 55% to 60%. Wall motion was normal; there were no regional wall motion abnormalities. Trivial MR/TR.  He has negative stress tests in 2005 and 2008.  CXR on 07/30/12 showed: 1. No radiographic evidence of acute cardiopulmonary disease.  2. Atherosclerosis in the thoracic aorta.   Preoperative labs noted.  Patient has had recent PCP follow-up and saw cardiology within the past six months.  If no acute changes then I would anticipate that he could proceed as planned.  [facility] Stay [facility]/Anesthesiology Phone [phone] 01/09/2013 3:03 PM

## 2013-01-15 MED ORDER — CEFAZOLIN SODIUM-DEXTROSE 2-3 GM-% IV SOLR
2.0000 g | INTRAVENOUS | Status: DC
  Filled 2013-01-15: qty 50

## 2013-01-16 ENCOUNTER — Encounter (HOSPITAL_COMMUNITY): Payer: Self-pay | Admitting: *Deleted

## 2013-01-16 ENCOUNTER — Ambulatory Visit (HOSPITAL_COMMUNITY)
Admission: RE | Admit: 2013-01-16 | Discharge: 2013-01-16 | Disposition: A | Discharge status: Home / Self Care | Payer: Medicare Other | Source: Ambulatory Visit | Attending: Surgery | Admitting: Surgery

## 2013-01-16 ENCOUNTER — Ambulatory Visit (HOSPITAL_COMMUNITY): Payer: Medicare Other | Admitting: Certified Registered Nurse Anesthetist

## 2013-01-16 ENCOUNTER — Encounter (HOSPITAL_COMMUNITY): Payer: Medicare Other | Admitting: Vascular Surgery

## 2013-01-16 ENCOUNTER — Encounter (HOSPITAL_COMMUNITY)
Admission: RE | Disposition: A | Discharge status: Home / Self Care | Payer: Self-pay | Source: Ambulatory Visit | Attending: Surgery

## 2013-01-16 ENCOUNTER — Telehealth (INDEPENDENT_AMBULATORY_CARE_PROVIDER_SITE_OTHER): Payer: Self-pay | Admitting: General Surgery

## 2013-01-16 DIAGNOSIS — L723 Sebaceous cyst: Secondary | ICD-10-CM

## 2013-01-16 DIAGNOSIS — K4020 Bilateral inguinal hernia, without obstruction or gangrene, not specified as recurrent: Secondary | ICD-10-CM

## 2013-01-16 DIAGNOSIS — K4120 Bilateral femoral hernia, without obstruction or gangrene, not specified as recurrent: Secondary | ICD-10-CM | POA: Diagnosis present

## 2013-01-16 DIAGNOSIS — K4021 Bilateral inguinal hernia, without obstruction or gangrene, recurrent: Secondary | ICD-10-CM

## 2013-01-16 DIAGNOSIS — K458 Other specified abdominal hernia without obstruction or gangrene: Secondary | ICD-10-CM

## 2013-01-16 DIAGNOSIS — R2232 Localized swelling, mass and lump, left upper limb: Secondary | ICD-10-CM

## 2013-01-16 HISTORY — DX: Bilateral femoral hernia, without obstruction or gangrene, not specified as recurrent: K41.20

## 2013-01-16 HISTORY — PX: INGUINAL HERNIA REPAIR: SHX194

## 2013-01-16 HISTORY — PX: BREAST CYST EXCISION: SHX579

## 2013-01-16 HISTORY — PX: INSERTION OF MESH: SHX5868

## 2013-01-16 HISTORY — DX: Fecal impaction: K56.41

## 2013-01-16 SURGERY — REPAIR, HERNIA, INGUINAL, BILATERAL, LAPAROSCOPIC
Anesthesia: General | Site: Groin

## 2013-01-16 MED ORDER — FENTANYL CITRATE 0.05 MG/ML IJ SOLN: 25.0000 ug | INTRAMUSCULAR | Status: DC | PRN

## 2013-01-16 MED ORDER — BUPIVACAINE-EPINEPHRINE (PF) 0.25% -1:200000 IJ SOLN
INTRAMUSCULAR | Status: AC
  Filled 2013-01-16: qty 30

## 2013-01-16 MED ORDER — ROCURONIUM BROMIDE 100 MG/10ML IV SOLN
INTRAVENOUS | Status: DC | PRN
  Administered 2013-01-16: 10 mg via INTRAVENOUS
  Administered 2013-01-16: 50 mg via INTRAVENOUS
  Administered 2013-01-16: 15 mg via INTRAVENOUS

## 2013-01-16 MED ORDER — NEOSTIGMINE METHYLSULFATE 1 MG/ML IJ SOLN
INTRAMUSCULAR | Status: DC | PRN
  Administered 2013-01-16: 4 mg via INTRAVENOUS

## 2013-01-16 MED ORDER — CHLORHEXIDINE GLUCONATE 4 % EX LIQD: 1.0000 "application " | Freq: Once | CUTANEOUS | Status: DC

## 2013-01-16 MED ORDER — HYDRALAZINE HCL 20 MG/ML IJ SOLN
INTRAMUSCULAR | Status: AC
  Administered 2013-01-16: 10 mg
  Filled 2013-01-16: qty 1

## 2013-01-16 MED ORDER — BUPIVACAINE-EPINEPHRINE 0.25% -1:200000 IJ SOLN
INTRAMUSCULAR | Status: DC | PRN
  Administered 2013-01-16: 60 mL

## 2013-01-16 MED ORDER — ONDANSETRON HCL 4 MG/2ML IJ SOLN
INTRAMUSCULAR | Status: DC | PRN
  Administered 2013-01-16: 4 mg via INTRAVENOUS

## 2013-01-16 MED ORDER — ACETAMINOPHEN 10 MG/ML IV SOLN
1000.0000 mg | Freq: Once | INTRAVENOUS | Status: AC
  Administered 2013-01-16: 1000 mg via INTRAVENOUS

## 2013-01-16 MED ORDER — GLYCOPYRROLATE 0.2 MG/ML IJ SOLN
INTRAMUSCULAR | Status: DC | PRN
  Administered 2013-01-16: 0.1 mg via INTRAVENOUS
  Administered 2013-01-16: .8 mg via INTRAVENOUS

## 2013-01-16 MED ORDER — EPHEDRINE SULFATE 50 MG/ML IJ SOLN
INTRAMUSCULAR | Status: DC | PRN
  Administered 2013-01-16 (×6): 5 mg via INTRAVENOUS

## 2013-01-16 MED ORDER — PROPOFOL 10 MG/ML IV BOLUS
INTRAVENOUS | Status: DC | PRN
  Administered 2013-01-16: 180 mg via INTRAVENOUS

## 2013-01-16 MED ORDER — OXYCODONE HCL 5 MG PO TABS: 5.0000 mg | ORAL_TABLET | Freq: Once | ORAL | Status: DC | PRN

## 2013-01-16 MED ORDER — OXYCODONE HCL 5 MG/5ML PO SOLN: 5.0000 mg | Freq: Once | ORAL | Status: DC | PRN

## 2013-01-16 MED ORDER — LIDOCAINE HCL (CARDIAC) 20 MG/ML IV SOLN
INTRAVENOUS | Status: DC | PRN
  Administered 2013-01-16: 80 mg via INTRAVENOUS

## 2013-01-16 MED ORDER — OXYCODONE HCL 5 MG PO TABS: 5.0000 mg | ORAL_TABLET | Freq: Four times a day (QID) | ORAL | Status: DC | PRN

## 2013-01-16 MED ORDER — ONDANSETRON HCL 4 MG/2ML IJ SOLN: 4.0000 mg | Freq: Four times a day (QID) | INTRAMUSCULAR | Status: DC | PRN

## 2013-01-16 MED ORDER — 0.9 % SODIUM CHLORIDE (POUR BTL) OPTIME
TOPICAL | Status: DC | PRN
  Administered 2013-01-16 (×2): 1000 mL

## 2013-01-16 MED ORDER — MIDAZOLAM HCL 5 MG/5ML IJ SOLN
INTRAMUSCULAR | Status: DC | PRN
  Administered 2013-01-16: 1 mg via INTRAVENOUS

## 2013-01-16 MED ORDER — LACTATED RINGERS IV SOLN
INTRAVENOUS | Status: DC | PRN
  Administered 2013-01-16 (×2): via INTRAVENOUS

## 2013-01-16 MED ORDER — FENTANYL CITRATE 0.05 MG/ML IJ SOLN
INTRAMUSCULAR | Status: DC | PRN
  Administered 2013-01-16 (×2): 50 ug via INTRAVENOUS
  Administered 2013-01-16: 100 ug via INTRAVENOUS

## 2013-01-16 SURGICAL SUPPLY — 49 items
APPLIER CLIP 5 13 M/L LIGAMAX5 (MISCELLANEOUS)
APR CLP MED LRG 5 ANG JAW (MISCELLANEOUS)
BLADE SURG ROTATE 9660 (MISCELLANEOUS) ×2 IMPLANT
CANISTER SUCTION 2500CC (MISCELLANEOUS) IMPLANT
CHLORAPREP W/TINT 10.5 ML (MISCELLANEOUS) ×1 IMPLANT
CHLORAPREP W/TINT 26ML (MISCELLANEOUS) ×4 IMPLANT
CLIP APPLIE 5 13 M/L LIGAMAX5 (MISCELLANEOUS) IMPLANT
COVER SURGICAL LIGHT HANDLE (MISCELLANEOUS) ×4 IMPLANT
DEVICE SECURE STRAP 25 ABSORB (INSTRUMENTS) ×1 IMPLANT
DRAPE PED LAPAROTOMY (DRAPES) ×1 IMPLANT
DRAPE UTILITY 15X26 W/TAPE STR (DRAPE) ×2 IMPLANT
DRAPE WARM FLUID 44X44 (DRAPE) ×4 IMPLANT
DRSG TEGADERM 2-3/8X2-3/4 SM (GAUZE/BANDAGES/DRESSINGS) ×2 IMPLANT
DRSG TEGADERM 4X4.75 (GAUZE/BANDAGES/DRESSINGS) ×5 IMPLANT
ELECT CAUTERY BLADE 6.4 (BLADE) ×1 IMPLANT
ELECT REM PT RETURN 9FT ADLT (ELECTROSURGICAL) ×4
ELECTRODE REM PT RTRN 9FT ADLT (ELECTROSURGICAL) ×3 IMPLANT
GAUZE SPONGE 2X2 8PLY STRL LF (GAUZE/BANDAGES/DRESSINGS) ×3 IMPLANT
GLOVE BIOGEL PI IND STRL 6.5 (GLOVE) IMPLANT
GLOVE BIOGEL PI IND STRL 7.5 (GLOVE) IMPLANT
GLOVE BIOGEL PI IND STRL 8 (GLOVE) ×3 IMPLANT
GLOVE BIOGEL PI INDICATOR 6.5 (GLOVE) ×3
GLOVE BIOGEL PI INDICATOR 7.5 (GLOVE) ×1
GLOVE BIOGEL PI INDICATOR 8 (GLOVE) ×1
GLOVE SURG SS PI 6.5 STRL IVOR (GLOVE) ×3 IMPLANT
GLOVE SURG SS PI 7.5 STRL IVOR (GLOVE) ×1 IMPLANT
GLOVE SURG SS PI 8.0 STRL IVOR (GLOVE) ×2 IMPLANT
GOWN STRL NON-REIN LRG LVL3 (GOWN DISPOSABLE) ×9 IMPLANT
GOWN STRL REIN XL XLG (GOWN DISPOSABLE) ×4 IMPLANT
KIT BASIN OR (CUSTOM PROCEDURE TRAY) ×4 IMPLANT
KIT ROOM TURNOVER OR (KITS) ×4 IMPLANT
MESH ULTRAPRO 6X6 15CM15CM (Mesh General) ×3 IMPLANT
NEEDLE 22X1 1/2 (OR ONLY) (NEEDLE) ×4 IMPLANT
NS IRRIG 1000ML POUR BTL (IV SOLUTION) ×5 IMPLANT
PAD ARMBOARD 7.5X6 YLW CONV (MISCELLANEOUS) ×8 IMPLANT
PENCIL BUTTON HOLSTER BLD 10FT (ELECTRODE) ×1 IMPLANT
SCISSORS LAP 5X35 DISP (ENDOMECHANICALS) ×1 IMPLANT
SET IRRIG TUBING LAPAROSCOPIC (IRRIGATION / IRRIGATOR) IMPLANT
SPECIMEN JAR SMALL (MISCELLANEOUS) ×1 IMPLANT
SPONGE GAUZE 2X2 STER 10/PKG (GAUZE/BANDAGES/DRESSINGS) ×2
SUT MNCRL AB 4-0 PS2 18 (SUTURE) ×5 IMPLANT
SUT VIC AB 3-0 SH 27 (SUTURE)
SUT VIC AB 3-0 SH 27XBRD (SUTURE) IMPLANT
SUT VIC AB 3-0 SH 8-18 (SUTURE) ×1 IMPLANT
TOWEL OR 17X24 6PK STRL BLUE (TOWEL DISPOSABLE) ×4 IMPLANT
TOWEL OR 17X26 10 PK STRL BLUE (TOWEL DISPOSABLE) ×4 IMPLANT
TRAY LAPAROSCOPIC (CUSTOM PROCEDURE TRAY) ×4 IMPLANT
TROCAR XCEL BLUNT TIP 100MML (ENDOMECHANICALS) ×4 IMPLANT
TROCAR XCEL NON-BLD 5MMX100MML (ENDOMECHANICALS) ×8 IMPLANT

## 2013-01-16 NOTE — OR Nursing (Signed)
Patient stated he voided while in the pre-operative area. Asked Karie Soda, MD about a foley upon his arrival to the operating room. He said to hold it as long as the CRNA did not over load the patient with fluids. Foley held.  Oralia Manis, RN

## 2013-01-16 NOTE — Anesthesia Preprocedure Evaluation (Addendum)
Anesthesia Evaluation  Patient identified by MRN, date of birth, ID band Patient awake    Reviewed: Allergy & Precautions, H&P , NPO status , Patient's Chart, lab work & pertinent test results  Airway Mallampati: II  Neck ROM: full  Mouth opening: Limited Mouth Opening  Dental  (+) Teeth Intact and Dental Advisory Given   Pulmonary former smoker,          Cardiovascular hypertension, + Peripheral Vascular Disease     Neuro/Psych  Headaches, Anxiety Depression  Neuromuscular disease    GI/Hepatic   Endo/Other  Hypothyroidism   Renal/GU      Musculoskeletal   Abdominal   Peds  Hematology   Anesthesia Other Findings   Reproductive/Obstetrics                          Anesthesia Physical Anesthesia Plan  ASA: III  Anesthesia Plan: General   Post-op Pain Management:    Induction: Intravenous  Airway Management Planned: Oral ETT  Additional Equipment:   Intra-op Plan:   Post-operative Plan: Extubation in OR  Informed Consent: I have reviewed the patients History and Physical, chart, labs and discussed the procedure including the risks, benefits and alternatives for the proposed anesthesia with the patient or authorized representative who has indicated his/her understanding and acceptance.     Plan Discussed with: CRNA, Anesthesiologist and Surgeon  Anesthesia Plan Comments:         Anesthesia Quick Evaluation

## 2013-01-16 NOTE — Anesthesia Procedure Notes (Signed)
Procedure Name: Intubation Date/Time: 01/16/2013 7:21 AM Performed by: Reine Just Pre-anesthesia Checklist: Patient identified, Emergency Drugs available, Suction available, Patient being monitored and Timeout performed Patient Re-evaluated:Patient Re-evaluated prior to inductionOxygen Delivery Method: Circle system utilized Preoxygenation: Pre-oxygenation with 100% oxygen Intubation Type: IV induction Ventilation: Mask ventilation without difficulty Laryngoscope Size: Miller and 3 Grade View: Grade I Tube type: Oral Tube size: 7.5 mm Number of attempts: 1 Airway Equipment and Method: Patient positioned with wedge pillow and Stylet Placement Confirmation: ETT inserted through vocal cords under direct vision,  positive ETCO2 and breath sounds checked- equal and bilateral Secured at: 24 cm Tube secured with: Tape Dental Injury: Teeth and Oropharynx as per pre-operative assessment

## 2013-01-16 NOTE — Anesthesia Postprocedure Evaluation (Signed)
Anesthesia Post Note  Patient: Jose Leach  Procedure(s) Performed: Procedure(s) (LRB): LAPAROSCOPIC BILATERAL INGUINAL DIRECT AND INDIRECT, FEMORAL, AND OBTURATOR HERNIAS (Bilateral) INSERTION OF MESH (N/A) MASS EXCISION AXILLA (Left)  Anesthesia type: General  Patient location: PACU  Post pain: Pain level controlled and Adequate analgesia  Post assessment: Post-op Vital signs reviewed, Patient's Cardiovascular Status Stable, Respiratory Function Stable, Patent Airway and Pain level controlled  Last Vitals:  Filed Vitals:   01/16/13 1125  BP: 160/74  Pulse: 68  Temp: 36.4 C  Resp: 10    Post vital signs: Reviewed and stable  Level of consciousness: awake, alert  and oriented  Complications: No apparent anesthesia complications

## 2013-01-16 NOTE — H&P (Signed)
[facility]  Feb 10, 1933 [id number]  CARE TEAM:  PCP: [name], MD  Outpatient Care Team: Patient Care Team: Pecola Lawless, MD as PCP - General (Internal Medicine)  Inpatient Treatment Team: Treatment Team: Attending Provider: Ardeth Sportsman, MD  This patient is a 79 y.o.male who presents today for surgical evaluation  Reason for visit: Symptomatic bilateral inguinal hernias. Nodule left armpit   Pleasant elderly male. And uses a rolling walker for balance and for some lower extra any neuralgias. Has had hernias in his groin for the past two years. They have gotten much more bothersome. Painful to lift up his walker. Worsening pain when he is out shopping and has to stop. Concerning. His primary care physician recommended surgical evaluation. Also noted is a mass in his left armpit most likely a cyst. It has gotten infected in the past. Not now. Wished it to be addressed as well.   Does have some issues with severe constipation. Required recent fecal disimpaction. On a bowel regimen and improved. No fevers or chills. Had an appendectomy but no other abdominal surgeries. No history of cardiac disease or pulmonary disease. His blood thinners. No history of MRSA.  No new events since last visit.   Past Medical History  Diagnosis Date  . Migraines     Dr [name]  . Peripheral neuropathy   . Benign essential tremor   . Cervical stenosis of spine   . Anemia     B12 deficiency  . Testosterone deficiency     Dr [name], [facility]  . Hypothyroidism   . Hyperlipidemia   . Depression     Dr [name]  . History of shingles 2009  . H/O cardiovascular stress test     a. 02/2003 -> no ischemia/infarct.  Marland Kitchen Hx of echocardiogram     Echocardiogram 08/01/12: Mild LVH, EF 55-60%, normal wall motion.  . Sinus bradycardia   . PAD (peripheral artery disease)   . Carotid stenosis     s/p L CEA    Past Surgical History  Procedure Laterality Date  . Appendectomy  1941  . Lumbar  laminectomy    . Carotid endarterectomy       L  . Tonsillectomy and adenoidectomy    . Total hip arthroplasty      L  . Cataract extraction Right   . Eye surgery Right 02/2012    retina    History   Social History  . Marital Status: Married    Spouse Name: Alvino Chapel    Number of Children: 2  . Years of Education: N/A   Occupational History  . retired    Social History Main Topics  . Smoking status: Former Smoker -- 10 years    Types: Pipe, Cigars    Quit date: 02/16/1979  . Smokeless tobacco: Never Used     Comment: Quit about age 19   . Alcohol Use: 4.2 oz/week    7 Glasses of wine per week     Comment:    . Drug Use: No  . Sexual Activity: No   Other Topics Concern  . Not on file   Social History Narrative  . No narrative on file    Family History  Problem Relation Age of Onset  . Stroke Mother 72  . Hypertension Mother   . Diabetes Mother   . Rectal cancer Father 75  . Heart disease Father     in 46s  . Cancer Father     colorectal  .  Nephritis Sister   . Seizures Brother     died as child    Current Facility-Administered Medications  Medication Dose Route Frequency Provider Last Rate Last Dose  . ceFAZolin (ANCEF) IVPB 2 g/50 mL premix  2 g Intravenous 60 min Pre-Op Ardeth Sportsman, MD      . chlorhexidine (HIBICLENS) 4 % liquid 1 application  1 application Topical Once Ardeth Sportsman, MD         Allergies  Allergen Reactions  . Latex     REACTION: rash from adhesive  . Tape Rash    Paper tape is ok to use     ROS: Constitutional:  No fevers, chills, sweats.  Weight stable Eyes:  No vision changes, No discharge HENT:  No sore throats, nasal drainage Lymph: No neck swelling, No bruising easily Pulmonary:  No cough, productive sputum CV: No orthopnea, PND  No exertional chest/neck/shoulder/arm pain. GI:  No personal nor family history of GI/colon cancer, inflammatory bowel disease, irritable bowel syndrome, allergy such as Celiac Sprue,  dietary/dairy problems, colitis, ulcers nor gastritis.  No recent sick contacts/gastroenteritis.  No travel outside the country.  No changes in diet. Renal: No UTIs, No hematuria Genital:  No drainage, bleeding, masses Musculoskeletal: Some arthralgias affecting his gait.   Uses walker for balance.  Stable arthralgias Skin:  No sores or lesions.  No rashes Heme/Lymph:  No easy bleeding.  No swollen lymph nodes Neuro: No focal weakness/numbness.  No seizures.  Occasional mild dizziness. Psych: No suicidal ideation.  No hallucinations  BP 191/63  Pulse 48  Temp(Src) 97 F (36.1 C)  Resp 20  SpO2 100%  Physical Exam: General: Pt awake/alert/oriented x4 in no major acute distress Eyes: PERRL, normal EOM. Sclera nonicteric Neuro: CN II-XII intact w/o focal sensory/motor deficits. Lymph: No head/neck/groin lymphadenopathy Psych:  No delerium/psychosis/paranoia HENT: Normocephalic, Mucus membranes moist.  No thrush Neck: Supple, No tracheal deviation Chest: No pain.  Good respiratory excursion. CV:  Pulses intact.  Regular rhythm Abdomen: Soft, Nondistended.  Nontender.  No incarcerated hernias. Normal external male genitalia.  Bilateral anal hernias.  Left more sensitive. Ext:  SCDs BLE.  No significant edema.  No cyanosis Skin: No petechiae / purpurea.  No major sores.  Left axillary mass.  Most likely a cyst. Musculoskeletal: No severe joint pain.  Good ROM major joints   Results:   Labs: No results found for this or any previous visit (from the past 48 hour(s)).  Imaging / Studies: No results found.  Medications / Allergies: per chart  Antibiotics: Anti-infectives   Start     Dose/Rate Route Frequency Ordered Stop   01/16/13 0600  ceFAZolin (ANCEF) IVPB 2 g/50 mL premix     2 g 100 mL/hr over 30 Minutes Intravenous 60 min pre-op 01/15/13 1547        Assessment  Jose Leach  79 y.o. male  Day of Surgery  Procedure(s): LAPAROSCOPIC BILATERAL INGUINAL HERNIA  REPAIR, REMOVAL CYST LEFT AXALLA INSERTION OF MESH  Problem List:  Active Problems:   * No active hospital problems. *   Bilateral inguinal hernia symptomatic.  Left axillary mass consistent with cyst with history recurrent infections  Plan:  Laparoscopic bilateral inguinal hernia repair:  The anatomy & physiology of the abdominal wall and pelvic floor was discussed.  The pathophysiology of hernias in the inguinal and pelvic region was discussed.  Natural history risks such as progressive enlargement, pain, incarceration & strangulation was discussed.   Contributors to  complications such as smoking, obesity, diabetes, prior surgery, etc were discussed.    I feel the risks of no intervention will lead to serious problems that outweigh the operative risks; therefore, I recommended surgery to reduce and repair the hernia.  I explained laparoscopic techniques with possible need for an open approach.  I noted usual use of mesh to patch and/or buttress hernia repair  Risks such as bleeding, infection, abscess, need for further treatment, heart attack, death, and other risks were discussed.  I noted a good likelihood this will help address the problem.   Goals of post-operative recovery were discussed as well.  Possibility that this will not correct all symptoms was explained.  I stressed the importance of low-impact activity, aggressive pain control, avoiding constipation, & not pushing through pain to minimize risk of post-operative chronic pain or injury. Possibility of reherniation was discussed.  We will work to minimize complications.     An educational handout further explaining the pathology & treatment options was given as well.  Questions were answered.  The patient expresses understanding & wishes to proceed with surgery.  Excision of left axillary mass as well to avoid further episodes of infection:  The pathophysiology of skin & subcutaneous masses was discussed.  Natural history risks  without surgery were discussed.  I recommended surgery to remove the mass.  I explained the technique of removal with use of local anesthesia & possible need for more aggressive sedation/anesthesia for patient comfort.    Risks such as bleeding, infection, heart attack, death, and other risks were discussed.  I noted a good likelihood this will help address the problem.   Possibility that this will not correct all symptoms was explained. Possibility of regrowth/recurrence of the mass was discussed.  We will work to minimize complications. Questions were answered.  The patient expresses understanding & wishes to proceed with surgery.      -VTE prophylaxis- SCDs, etc -mobilize as tolerated to help recovery    Ardeth Sportsman, M.D., F.A.C.S. Gastrointestinal and Minimally Invasive Surgery [facility] [address], [address] [place], [place] [address] [phone] Main / Paging   01/16/2013

## 2013-01-16 NOTE — Preoperative (Signed)
Beta Blockers   Reason not to administer Beta Blockers:Not Applicable 

## 2013-01-16 NOTE — Telephone Encounter (Signed)
[name] called to report "problems" since arriving home from surgery (BIH.)  She describes swelling and discoloration of his scrotum and his penis is disappearing into it.  He has not voided, but came home from surgery and slept.  Advised wife to apply ice to groins, elevate legs and then scrotum AMAP to promote fluid drainage, and begin to push po fluids.  He will not void if he doesn't drink first, but if he has not voided by 6:00 am, call for advice.  She is reassured and will do all these things.

## 2013-01-16 NOTE — Transfer of Care (Signed)
Immediate Anesthesia Transfer of Care Note  Patient: Jose Leach  Procedure(s) Performed: Procedure(s): LAPAROSCOPIC BILATERAL INGUINAL DIRECT AND INDIRECT, FEMORAL, AND OBTURATOR HERNIAS (Bilateral) INSERTION OF MESH (N/A) MASS EXCISION AXILLA (Left)  Patient Location: PACU  Anesthesia Type:General  Level of Consciousness: awake and alert   Airway & Oxygen Therapy: Patient Spontanous Breathing and Patient connected to nasal cannula oxygen  Post-op Assessment: Report given to PACU RN and Post -op Vital signs reviewed and stable  Post vital signs: Reviewed and stable  Complications: No apparent anesthesia complications

## 2013-01-16 NOTE — Op Note (Signed)
01/16/2013  9:37 AM  PATIENT:  Jose Leach  79 y.o. male  Patient Care Team: Pecola Lawless, MD as PCP - General (Internal Medicine)  PRE-OPERATIVE DIAGNOSIS:  BILATERAL HERNIAS; LEFT AXILLARY MASS  POST-OPERATIVE DIAGNOSIS:    BILATERAL INGUINAL HERNIAS BILATERAL FEMORAL HERNIAS BILATERAL OBTURATOR HERNIAS LEFT AXILLARY MASS  PROCEDURE:  Procedure(s): LAPAROSCOPIC BILATERAL INGUINAL DIRECT AND INDIRECT, FEMORAL, AND OBTURATOR HERNIAS INSERTION OF MESH MASS EXCISION AXILLA  SURGEON:  Surgeon(s): Ardeth Sportsman, MD  ASSISTANT: Magnus Ivan, RNFA   ANESTHESIA:   local and general  EBL:  Total I/O In: 1000 [I.V.:1000] Out: -   Delay start of Pharmacological VTE agent (>24hrs) due to surgical blood loss or risk of bleeding:  no  DRAINS: none   SPECIMEN:  Source of Specimen:  Left axillary SQ mass (3x2x2cm, probable cyst)  DISPOSITION OF SPECIMEN:  PATHOLOGY  COUNTS:  YES  PLAN OF CARE: Discharge to home after PACU  PATIENT DISPOSITION:  PACU - hemodynamically stable.  INDICATION: Pleasant elderly gentleman with increasingly symptomatic bilateral inguinal hernias.  Also with left axillary mass with recurrent infection suspicious for cyst.  Because of worsening of symptoms, I recommended excision of the cyst and repair of the inguinal hernias.  The anatomy & physiology of the abdominal wall and pelvic floor was discussed.  The pathophysiology of hernias in the inguinal and pelvic region was discussed.  Natural history risks such as progressive enlargement, pain, incarceration & strangulation was discussed.   Contributors to complications such as smoking, obesity, diabetes, prior surgery, etc were discussed.    I feel the risks of no intervention will lead to serious problems that outweigh the operative risks; therefore, I recommended surgery to reduce and repair the hernia.  I explained laparoscopic techniques with possible need for an open approach.  I noted  usual use of mesh to patch and/or buttress hernia repair  Risks such as bleeding, infection, abscess, need for further treatment, heart attack, death, and other risks were discussed.  I noted a good likelihood this will help address the problem.   Goals of post-operative recovery were discussed as well.  Possibility that this will not correct all symptoms was explained.  I stressed the importance of low-impact activity, aggressive pain control, avoiding constipation, & not pushing through pain to minimize risk of post-operative chronic pain or injury. Possibility of reherniation was discussed.  We will work to minimize complications.     An educational handout further explaining the pathology & treatment options was given as well.  Questions were answered.  The patient expresses understanding & wishes to proceed with surgery.  OR FINDINGS: 3 by 2 x 2 centimeter cystic mass and subcutaneous tissues the left axilla.  Most likely sebaceous cyst.  Patient had moderate ly large bilateral indirect/direct inguinal hernias.  Also large bilateral femoral hernias.  Patient had small bilateral obturator hernias (right greater than left).  DESCRIPTION:   The patient was identified & brought into the operating room. The patient's bladder was emptied.  The patient was positioned supine. SCDs were active during the entire case. The patient underwent general anesthesia without any difficulty.  Patient's left axilla and chest were prepped and draped in a sterile fashion.  Surgical timeout confirmed our plan.  I made a biconcave elliptical incision around the subcutaneous mass in the left axilla parallel to the axillary fold.  I removed the mass out of the subcutaneous tissues.  I assured hemostasis.  I closed the wound using interrupted 3-0 Vicryl  deep dermal stitches and 4-0 Monocryl subcuticular stitch.  Steri-Strips and sterile dressing was applied.  Patient's arms were tucked carefully.  The abdomen was prepped  and draped in a sterile fashion.   A Surgical Timeout confirmed our plan.  I made a transverse incision through the inferior umbilical fold.  I made a small transverse nick through the anterior rectus fascia contralateral to the inguinal hernia side and placed a 0-vicryl stitch through the fascia.  I placed a Hasson trocar into the preperitoneal plane.  Entry was clean.  We induced carbon dioxide insufflation. Camera inspection revealed no injury.  I used a 10mm angled scope to bluntly free the peritoneum off the infraumbilical anterior abdominal wall.  I created enough of a preperitoneal pocket to place 5mm ports into the right & left mid-abdomen into this preperitoneal cavity.  I focused attention on the right side since that was the dominant hernia side.   I used blunt & focused sharp dissection to free the peritoneum off the flank and down to the pubic rim.  I freed the anteriolateral bladder wall off the anteriolateral pelvic wall, sparing midline attachments.   Freed prepared no adhesions from his prior open appendectomy incision.  Used scissors was focused cautery at times.  I located a swath of peritoneum going into a hernia fascial defect at the direct space & internal ring consistent with a pantaloon dir/indirect hernia.  I gradually freed the peritoneal hernia sac off safely and reduced it into the preperitoneal space.  I freed the peritoneum off the spermatic vessels & vas deferens.  And dissecting, the patient had an obvious moderate-sized femoral hernia that had some preperitoneal fat.  I reduces down.  I also noted a moderate size obturator hernia containing fat area and I reduced that down as well.  I freed peritoneum off the retroperitoneum along the psoas muscle.    I checked & assured hemostasis.    I turned attention on the opposite side.  I did dissection in a similar, mirror-image fashion. The patient Similar hernias.  Left pantaloon.  Indirect hernia smaller than on right.  Slightly  larger femoral hernia on the left.  Slightly smaller obturator hernia. There were no breaches in peritoneum that required closure.  Hemostasis was good  I chose 15x15 cm sheets of ultra-lightweight polypropylene mesh (Ultrapro), one for each side.  I cut a single sigmoid-shaped slit ~6cm from a corner of each mesh.  I placed the meshes into the preperitoneal space & laid them as overlapping diamonds such that at the inferior points, a 6x6 cm corner flap rested in the true anterolateral pelvis, covering the obturator & femoral foramina.   I allowed the bladder to fall back and help tuck the corners of the mesh in.  The medial corners overlapped each other across midline cephalad to the pubic rim.   This provided >2 inch coverage around the hernia.  Because he had moderate-sized hernias in many locations, placed a third 15x15 cm as a diamond in the preperitoneal midline.  The inferior corner went down over the dome of the bladder.  The lateral corners help cover the direct space as well.  Because he had many defects of moderate-sized, I did place tacks along the pubic rim and in the left upper lateral and paramedian regions to help hold the mesh in place using absorbable tacks.  I held the hernia sacs cephalad & evacuated carbon dioxide.  I closed the fascia  With absorbable suture.  I closed the  skin using 4-0 monocryl stitch.  Sterile dressings were applied. The patient was extubated & arrived in the PACU in stable condition..  I had discussed postoperative care with the patient in the holding area.   I did discuss operative findings and postoperative goals / instructions to family as well.  Instructions are written in the chart.

## 2013-01-17 ENCOUNTER — Encounter (HOSPITAL_COMMUNITY): Payer: Self-pay | Admitting: Surgery

## 2013-01-18 ENCOUNTER — Telehealth (INDEPENDENT_AMBULATORY_CARE_PROVIDER_SITE_OTHER): Payer: Self-pay | Admitting: *Deleted

## 2013-01-18 NOTE — Telephone Encounter (Signed)
Patient called to report that he has not had a BM since Monday.  Patient advised to take 1 dose (1 capful) of Miralax to try to induce a BM.  Patient also instructed to give [place] a call back if he has not had a BM by tomorrow afternoon.  Miralax was advised per protocol.  Patient states understanding and agreeable with plan at this time.

## 2013-01-19 ENCOUNTER — Telehealth (INDEPENDENT_AMBULATORY_CARE_PROVIDER_SITE_OTHER): Payer: Self-pay | Admitting: General Surgery

## 2013-01-19 NOTE — Telephone Encounter (Signed)
Pt called to report he had not had a BM since Monday.  Pt had BIH repairs.  He is taking 2 stool softeners QD and 1 capful Miralax QD.  Recommended pt walk more, double his fluid intake daily, increase Miralax to 2 capsful in 10-12 oz water BID.  Emphasized his need to walk and drink more fluids.  Requested to speak to wife to cover same information with her and did so.  She understands and repeated all the changes back to me.  They will call back as needed.

## 2013-01-19 NOTE — Telephone Encounter (Signed)
Follow post-op instructions.  Fiber supp 1-2x/day to minimize constipation.  Laxative PRN  GETTING TO GOOD BOWEL HEALTH. Irregular bowel habits such as constipation and diarrhea can lead to many problems over time.  Having one soft bowel movement a day is the most important way to prevent further problems.  The anorectal canal is designed to handle stretching and feces to safely manage our ability to get rid of solid waste (feces, poop, stool) out of our body.  BUT, hard constipated stools can act like ripping concrete bricks and diarrhea can be a burning fire to this very sensitive area of our body, causing inflamed hemorrhoids, anal fissures, increasing risk is perirectal abscesses, abdominal pain/bloating, an making irritable bowel worse.     The goal: ONE SOFT BOWEL MOVEMENT A DAY!  To have soft, regular bowel movements:    Drink at least 8 tall glasses of water a day.     Take plenty of fiber.  Fiber is the undigested part of plant food that passes into the colon, acting s "natures broom" to encourage bowel motility and movement.  Fiber can absorb and hold large amounts of water. This results in a larger, bulkier stool, which is soft and easier to pass. Work gradually over several weeks up to 6 servings a day of fiber (25g a day even more if needed) in the form of: o Vegetables -- Root (potatoes, carrots, turnips), leafy green (lettuce, salad greens, celery, spinach), or cooked high residue (cabbage, broccoli, etc) o Fruit -- Fresh (unpeeled skin & pulp), Dried (prunes, apricots, cherries, etc ),  or stewed ( applesauce)  o Whole grain breads, pasta, etc (whole wheat)  o Bran cereals    Bulking Agents -- This type of water-retaining fiber generally is easily obtained each day by one of the following:  o Psyllium bran -- The psyllium plant is remarkable because its ground seeds can retain so much water. This product is available as Metamucil, Konsyl, Effersyllium, Per Diem Fiber, or the less  expensive generic preparation in drug and health food stores. Although labeled a laxative, it really is not a laxative.  o Methylcellulose -- This is another fiber derived from wood which also retains water. It is available as Citrucel. o Polyethylene Glycol - and "artificial" fiber commonly called Miralax or Glycolax.  It is helpful for people with gassy or bloated feelings with regular fiber o Flax Seed - a less gassy fiber than psyllium   No reading or other relaxing activity while on the toilet. If bowel movements take longer than 5 minutes, you are too constipated   AVOID CONSTIPATION.  High fiber and water intake usually takes care of this.  Sometimes a laxative is needed to stimulate more frequent bowel movements, but    Laxatives are not a good long-term solution as it can wear the colon out. o Osmotics (Milk of Magnesia, Fleets phosphosoda, Magnesium citrate, MiraLax, GoLytely) are safer than  o Stimulants (Senokot, Castor Oil, Dulcolax, Ex Lax)    o Do not take laxatives for more than 7days in a row.    IF SEVERELY CONSTIPATED, try a Bowel Retraining Program: o Do not use laxatives.  o Eat a diet high in roughage, such as bran cereals and leafy vegetables.  o Drink six (6) ounces of prune or apricot juice each morning.  o Eat two (2) large servings of stewed fruit each day.  o Take one (1) heaping tablespoon of a psyllium-based bulking agent twice a day. Use sugar-free sweetener  when possible to avoid excessive calories.  o Eat a normal breakfast.  o Set aside 15 minutes after breakfast to sit on the toilet, but do not strain to have a bowel movement.  o If you do not have a bowel movement by the third day, use an enema and repeat the above steps.    Controlling diarrhea o Switch to liquids and simpler foods for a few days to avoid stressing your intestines further. o Avoid dairy products (especially milk & ice cream) for a short time.  The intestines often can lose the ability to  digest lactose when stressed. o Avoid foods that cause gassiness or bloating.  Typical foods include beans and other legumes, cabbage, broccoli, and dairy foods.  Every person has some sensitivity to other foods, so listen to our body and avoid those foods that trigger problems for you. o Adding fiber (Citrucel, Metamucil, psyllium, Miralax) gradually can help thicken stools by absorbing excess fluid and retrain the intestines to act more normally.  Slowly increase the dose over a few weeks.  Too much fiber too soon can backfire and cause cramping & bloating. o Probiotics (such as active yogurt, Align, etc) may help repopulate the intestines and colon with normal bacteria and calm down a sensitive digestive tract.  Most studies show it to be of mild help, though, and such products can be costly. o Medicines:   Bismuth subsalicylate (ex. Kayopectate, Pepto Bismol) every 30 minutes for up to 6 doses can help control diarrhea.  Avoid if pregnant.   Loperamide (Immodium) can slow down diarrhea.  Start with two tablets (4mg  total) first and then try one tablet every 6 hours.  Avoid if you are having fevers or severe pain.  If you are not better or start feeling worse, stop all medicines and call your doctor for advice o Call your doctor if you are getting worse or not better.  Sometimes further testing (cultures, endoscopy, X-ray studies, bloodwork, etc) may be needed to help diagnose and treat the cause of the diarrhea. o

## 2013-01-21 ENCOUNTER — Telehealth (INDEPENDENT_AMBULATORY_CARE_PROVIDER_SITE_OTHER): Payer: Self-pay | Admitting: Surgery

## 2013-01-21 NOTE — Telephone Encounter (Signed)
Patient had bilateral lap inguinal hernia repair by Dr. Michaell Cowing on 01/16/2013.  He called about no BM and distention. It looks like he called Friday, 12/5, and spoke to [place] about the same thing.  I offered him to come to the ER or to continue to try things at home. He wants to stay home.  He will go only liquids, he can try a Fleets enema, and need to ambulate. If no better, he will call the office in the AM.  D. [facility]

## 2013-01-22 ENCOUNTER — Encounter: Payer: Self-pay | Admitting: Nurse Practitioner

## 2013-01-22 ENCOUNTER — Telehealth: Payer: Self-pay | Admitting: Gastroenterology

## 2013-01-22 ENCOUNTER — Ambulatory Visit (INDEPENDENT_AMBULATORY_CARE_PROVIDER_SITE_OTHER): Payer: Medicare Other | Admitting: Nurse Practitioner

## 2013-01-22 VITALS — BP 138/70 | HR 68 | Ht 72.0 in | Wt 159.4 lb

## 2013-01-22 DIAGNOSIS — K5900 Constipation, unspecified: Secondary | ICD-10-CM

## 2013-01-22 DIAGNOSIS — K5909 Other constipation: Secondary | ICD-10-CM

## 2013-01-22 DIAGNOSIS — K5641 Fecal impaction: Secondary | ICD-10-CM

## 2013-01-22 NOTE — Telephone Encounter (Signed)
Patient called in this morning to report that he still has not had a BM.  Patient states he is feeling bloated.  Patient reports he has tried all suggestions including suggestion of Miralax from [place] without any relief.  Explained to patient that I will try to speak with one of the other MD's since Dr. Michaell Cowing is unavailable today then we will let him know.  Patient states understanding and agreeable at this time.

## 2013-01-22 NOTE — Progress Notes (Signed)
     History of Present Illness:   Patient is a 79 year old male known to Dr Arlyce Dice. I admitted him a few months ago for severe fecal impaction. He was last seen here in follow up 01/05/13 at which time he was reportedly having BMs on Linzess. Because of the hard stools he was advised to take a daily stool softener and use MiraLax if he went 2 days without a bowel movement. Patient had laparoscopic bilateral inguinal direct and indirect , femoral and obturator hernia repair with insertion of mesh  by Dr. Michaell Cowing on 12/2. His last BM was 12/1. She has been taking OxyContin since surgery . No nausea or vomiting. Patient called Dr. Michaell Cowing yesterday and advised to go on clear liquids.     Current Medications, Allergies, Past Medical History, Past Surgical History, Family History and Social History were reviewed in [employer] record.   Physical Exam: General: Pleasant, thin, white male in no acute distress Head: Normocephalic and atraumatic Eyes:  sclerae anicteric, conjunctiva pink  Ears: Normal auditory acuity Lungs: Decreased breath sounds right lower lobe, otherwise lungs clear to auscultation  Abdomen: Soft, mild lower abdominal distention, non-tender. No masses, no hepatomegaly. Normal bowel sounds Rectal: Impacted with hard stool felt just about my fingertip Skin: Postop bruising in peritoneum and scrotum Musculoskeletal: Symmetrical with no gross deformities  Extremities: No edema  Neurological: Alert oriented x 4, grossly nonfocal Psychological:  Alert and cooperative. Normal mood and affect  Assessment and Recommendations:  79 year old male with acute on chronic constipation. Patient has a history of severe fecal impactions. He has another impaction on exam. Will try a bisacodyl enema this evening. If no response will try mineral oil enema. Patient will call tomorrow with a condition update. Increase MiraLax to twice daily. Continue Linzess. Hopefully his need for  pain medication will diminish in the near future.

## 2013-01-22 NOTE — Telephone Encounter (Signed)
Pt states he had surgery recently and is constipated. States he has not had a BM since 01/15/13. Surgeons office instructed him to try Mag Citrate but pt does not think this will help, he has had problems with impactions in the past. Pt scheduled to see Willette Cluster NP today at 2:30pm. Pt aware of appt date and time.

## 2013-01-22 NOTE — Telephone Encounter (Signed)
Spoke to Dr. Maisie Fus who suggested patient try Mag Citrate.  Patient updated at this time and will go to drug store to get this.  Patient will give [place] a call back to update [place] if this does not work.  Patient states understanding and agreeable at this time.

## 2013-01-22 NOTE — Patient Instructions (Addendum)
As discussed with Willette Cluster, purchase one Fleet's Biscodyl Enema and one mineral enema  Use the Fleet's enema first, holding in for at least 30 minutes.  If this does not produce a bowel movement, use the mineral enema.  Increase your Miralax to twice a day and continue Linzess.  Make sure to drink plenty of fluids.  Call our office tomorrow to let us know how you are doing.

## 2013-01-23 ENCOUNTER — Telehealth: Payer: Self-pay | Admitting: Nurse Practitioner

## 2013-01-23 NOTE — Progress Notes (Signed)
If efforts are unsuccessful may try a gastrograffin enema

## 2013-01-23 NOTE — Telephone Encounter (Signed)
Spoke with patient and he took one enema and a capful of Miralax. He had results. Wanted to let Willette Cluster, NP know.

## 2013-01-26 ENCOUNTER — Inpatient Hospital Stay (HOSPITAL_COMMUNITY)
Admission: EM | Admit: 2013-01-26 | Discharge: 2013-01-29 | DRG: 683 | Disposition: A | Discharge status: Skilled Nursing Facility | Payer: Medicare Other | Attending: Family Medicine | Admitting: Family Medicine

## 2013-01-26 ENCOUNTER — Encounter (HOSPITAL_COMMUNITY): Payer: Self-pay | Admitting: Emergency Medicine

## 2013-01-26 ENCOUNTER — Emergency Department (HOSPITAL_COMMUNITY): Payer: Medicare Other

## 2013-01-26 DIAGNOSIS — E785 Hyperlipidemia, unspecified: Secondary | ICD-10-CM | POA: Diagnosis present

## 2013-01-26 DIAGNOSIS — Z800 Family history of malignant neoplasm of digestive organs: Secondary | ICD-10-CM

## 2013-01-26 DIAGNOSIS — F3289 Other specified depressive episodes: Secondary | ICD-10-CM | POA: Diagnosis present

## 2013-01-26 DIAGNOSIS — K4120 Bilateral femoral hernia, without obstruction or gangrene, not specified as recurrent: Secondary | ICD-10-CM

## 2013-01-26 DIAGNOSIS — Z7982 Long term (current) use of aspirin: Secondary | ICD-10-CM

## 2013-01-26 DIAGNOSIS — N179 Acute kidney failure, unspecified: Principal | ICD-10-CM | POA: Diagnosis present

## 2013-01-26 DIAGNOSIS — R5381 Other malaise: Secondary | ICD-10-CM | POA: Diagnosis present

## 2013-01-26 DIAGNOSIS — Z96649 Presence of unspecified artificial hip joint: Secondary | ICD-10-CM

## 2013-01-26 DIAGNOSIS — I10 Essential (primary) hypertension: Secondary | ICD-10-CM | POA: Diagnosis present

## 2013-01-26 DIAGNOSIS — K4021 Bilateral inguinal hernia, without obstruction or gangrene, recurrent: Secondary | ICD-10-CM

## 2013-01-26 DIAGNOSIS — R2689 Other abnormalities of gait and mobility: Secondary | ICD-10-CM

## 2013-01-26 DIAGNOSIS — Z79899 Other long term (current) drug therapy: Secondary | ICD-10-CM

## 2013-01-26 DIAGNOSIS — K5641 Fecal impaction: Secondary | ICD-10-CM

## 2013-01-26 DIAGNOSIS — Q742 Other congenital malformations of lower limb(s), including pelvic girdle: Secondary | ICD-10-CM

## 2013-01-26 DIAGNOSIS — Z8619 Personal history of other infectious and parasitic diseases: Secondary | ICD-10-CM

## 2013-01-26 DIAGNOSIS — R2232 Localized swelling, mass and lump, left upper limb: Secondary | ICD-10-CM

## 2013-01-26 DIAGNOSIS — R9431 Abnormal electrocardiogram [ECG] [EKG]: Secondary | ICD-10-CM

## 2013-01-26 DIAGNOSIS — R600 Localized edema: Secondary | ICD-10-CM

## 2013-01-26 DIAGNOSIS — I739 Peripheral vascular disease, unspecified: Secondary | ICD-10-CM | POA: Diagnosis present

## 2013-01-26 DIAGNOSIS — G250 Essential tremor: Secondary | ICD-10-CM | POA: Diagnosis present

## 2013-01-26 DIAGNOSIS — K567 Ileus, unspecified: Secondary | ICD-10-CM

## 2013-01-26 DIAGNOSIS — K560 Paralytic ileus: Secondary | ICD-10-CM | POA: Diagnosis present

## 2013-01-26 DIAGNOSIS — G43909 Migraine, unspecified, not intractable, without status migrainosus: Secondary | ICD-10-CM | POA: Diagnosis present

## 2013-01-26 DIAGNOSIS — T4275XA Adverse effect of unspecified antiepileptic and sedative-hypnotic drugs, initial encounter: Secondary | ICD-10-CM | POA: Diagnosis present

## 2013-01-26 DIAGNOSIS — E559 Vitamin D deficiency, unspecified: Secondary | ICD-10-CM

## 2013-01-26 DIAGNOSIS — M129 Arthropathy, unspecified: Secondary | ICD-10-CM

## 2013-01-26 DIAGNOSIS — E869 Volume depletion, unspecified: Secondary | ICD-10-CM | POA: Diagnosis present

## 2013-01-26 DIAGNOSIS — K458 Other specified abdominal hernia without obstruction or gangrene: Secondary | ICD-10-CM

## 2013-01-26 DIAGNOSIS — K5909 Other constipation: Secondary | ICD-10-CM | POA: Diagnosis present

## 2013-01-26 DIAGNOSIS — R5383 Other fatigue: Secondary | ICD-10-CM | POA: Diagnosis present

## 2013-01-26 DIAGNOSIS — Z91040 Latex allergy status: Secondary | ICD-10-CM

## 2013-01-26 DIAGNOSIS — M4800 Spinal stenosis, site unspecified: Secondary | ICD-10-CM

## 2013-01-26 DIAGNOSIS — R29818 Other symptoms and signs involving the nervous system: Secondary | ICD-10-CM

## 2013-01-26 DIAGNOSIS — R338 Other retention of urine: Secondary | ICD-10-CM

## 2013-01-26 DIAGNOSIS — E782 Mixed hyperlipidemia: Secondary | ICD-10-CM | POA: Diagnosis present

## 2013-01-26 DIAGNOSIS — R001 Bradycardia, unspecified: Secondary | ICD-10-CM

## 2013-01-26 DIAGNOSIS — F411 Generalized anxiety disorder: Secondary | ICD-10-CM

## 2013-01-26 DIAGNOSIS — Z86010 Personal history of colonic polyps: Secondary | ICD-10-CM

## 2013-01-26 DIAGNOSIS — Z9181 History of falling: Secondary | ICD-10-CM

## 2013-01-26 DIAGNOSIS — Z9109 Other allergy status, other than to drugs and biological substances: Secondary | ICD-10-CM

## 2013-01-26 DIAGNOSIS — I491 Atrial premature depolarization: Secondary | ICD-10-CM

## 2013-01-26 DIAGNOSIS — E039 Hypothyroidism, unspecified: Secondary | ICD-10-CM | POA: Diagnosis present

## 2013-01-26 DIAGNOSIS — R262 Difficulty in walking, not elsewhere classified: Secondary | ICD-10-CM | POA: Diagnosis present

## 2013-01-26 DIAGNOSIS — R531 Weakness: Secondary | ICD-10-CM | POA: Diagnosis present

## 2013-01-26 DIAGNOSIS — E860 Dehydration: Secondary | ICD-10-CM

## 2013-01-26 DIAGNOSIS — F329 Major depressive disorder, single episode, unspecified: Secondary | ICD-10-CM | POA: Diagnosis present

## 2013-01-26 DIAGNOSIS — Z87891 Personal history of nicotine dependence: Secondary | ICD-10-CM

## 2013-01-26 DIAGNOSIS — R339 Retention of urine, unspecified: Secondary | ICD-10-CM | POA: Diagnosis present

## 2013-01-26 HISTORY — DX: Acute kidney failure, unspecified: N17.9

## 2013-01-26 LAB — CBC WITH DIFFERENTIAL/PLATELET
Basophils Absolute: 0 10*3/uL (ref 0.0–0.1)
Eosinophils Relative: 2 % (ref 0–5)
HCT: 35.2 % — ABNORMAL LOW (ref 39.0–52.0)
Lymphocytes Relative: 11 % — ABNORMAL LOW (ref 12–46)
Lymphs Abs: 0.7 10*3/uL (ref 0.7–4.0)
MCV: 91.2 fL (ref 78.0–100.0)
Monocytes Absolute: 0.9 10*3/uL (ref 0.1–1.0)
Neutrophils Relative %: 73 % (ref 43–77)
Platelets: 196 10*3/uL (ref 150–400)
RBC: 3.86 MIL/uL — ABNORMAL LOW (ref 4.22–5.81)
RDW: 13.5 % (ref 11.5–15.5)
WBC: 6.6 10*3/uL (ref 4.0–10.5)

## 2013-01-26 LAB — URINALYSIS, ROUTINE W REFLEX MICROSCOPIC
Nitrite: NEGATIVE
Protein, ur: NEGATIVE mg/dL
Specific Gravity, Urine: 1.017 (ref 1.005–1.030)
Urobilinogen, UA: 0.2 mg/dL (ref 0.0–1.0)

## 2013-01-26 LAB — COMPREHENSIVE METABOLIC PANEL
ALT: 14 U/L (ref 0–53)
AST: 16 U/L (ref 0–37)
Alkaline Phosphatase: 49 U/L (ref 39–117)
CO2: 20 mEq/L (ref 19–32)
Calcium: 8.9 mg/dL (ref 8.4–10.5)
Chloride: 106 mEq/L (ref 96–112)
Glucose, Bld: 112 mg/dL — ABNORMAL HIGH (ref 70–99)
Potassium: 3.9 mEq/L (ref 3.5–5.1)
Sodium: 138 mEq/L (ref 135–145)
Total Protein: 6.1 g/dL (ref 6.0–8.3)

## 2013-01-26 LAB — URINE MICROSCOPIC-ADD ON

## 2013-01-26 MED ORDER — ATORVASTATIN CALCIUM 20 MG PO TABS
20.0000 mg | ORAL_TABLET | Freq: Every day | ORAL | Status: DC
  Administered 2013-01-27 – 2013-01-28 (×2): 20 mg via ORAL
  Filled 2013-01-26 (×3): qty 1

## 2013-01-26 MED ORDER — TOPIRAMATE 25 MG PO TABS
25.0000 mg | ORAL_TABLET | Freq: Every day | ORAL | Status: DC
  Administered 2013-01-27 – 2013-01-29 (×3): 25 mg via ORAL
  Filled 2013-01-26 (×3): qty 1

## 2013-01-26 MED ORDER — TAMSULOSIN HCL 0.4 MG PO CAPS
0.4000 mg | ORAL_CAPSULE | Freq: Every day | ORAL | Status: DC
  Administered 2013-01-27 (×2): 0.4 mg via ORAL
  Filled 2013-01-26 (×3): qty 1

## 2013-01-26 MED ORDER — TOPIRAMATE 25 MG PO TABS
50.0000 mg | ORAL_TABLET | Freq: Every day | ORAL | Status: DC
  Administered 2013-01-27 – 2013-01-28 (×3): 50 mg via ORAL
  Filled 2013-01-26 (×5): qty 2

## 2013-01-26 MED ORDER — VITAMIN B-12 1000 MCG PO TABS
1000.0000 ug | ORAL_TABLET | Freq: Every day | ORAL | Status: DC
  Administered 2013-01-26 – 2013-01-29 (×5): 1000 ug via ORAL
  Filled 2013-01-26 (×5): qty 1

## 2013-01-26 MED ORDER — VITAMIN D3 25 MCG (1000 UNIT) PO TABS
4000.0000 [IU] | ORAL_TABLET | Freq: Every day | ORAL | Status: DC
  Administered 2013-01-26 – 2013-01-29 (×4): 4000 [IU] via ORAL
  Filled 2013-01-26 (×4): qty 4

## 2013-01-26 MED ORDER — ONDANSETRON HCL 4 MG/2ML IJ SOLN: 4.0000 mg | Freq: Four times a day (QID) | INTRAMUSCULAR | Status: DC | PRN

## 2013-01-26 MED ORDER — LEVOTHYROXINE SODIUM 25 MCG PO TABS
25.0000 ug | ORAL_TABLET | ORAL | Status: DC
  Administered 2013-01-28: 08:00:00 25 ug via ORAL
  Filled 2013-01-26: qty 1

## 2013-01-26 MED ORDER — SODIUM CHLORIDE 0.9 % IV BOLUS (SEPSIS)
1000.0000 mL | Freq: Once | INTRAVENOUS | Status: AC
  Administered 2013-01-26: 1000 mL via INTRAVENOUS

## 2013-01-26 MED ORDER — LINACLOTIDE 290 MCG PO CAPS
290.0000 ug | ORAL_CAPSULE | Freq: Every day | ORAL | Status: DC
  Administered 2013-01-27 – 2013-01-29 (×3): 290 ug via ORAL
  Filled 2013-01-26 (×3): qty 1

## 2013-01-26 MED ORDER — LEVOTHYROXINE SODIUM 50 MCG PO TABS
50.0000 ug | ORAL_TABLET | ORAL | Status: DC
  Administered 2013-01-27 – 2013-01-29 (×2): 50 ug via ORAL
  Filled 2013-01-26 (×3): qty 1

## 2013-01-26 MED ORDER — B COMPLEX VITAMINS PO CAPS
1.0000 | ORAL_CAPSULE | Freq: Every day | ORAL | Status: DC
  Filled 2013-01-26: qty 1

## 2013-01-26 MED ORDER — LATANOPROST 0.005 % OP SOLN
1.0000 [drp] | Freq: Every day | OPHTHALMIC | Status: DC
  Administered 2013-01-26 – 2013-01-28 (×3): 1 [drp] via OPHTHALMIC
  Filled 2013-01-26: qty 2.5

## 2013-01-26 MED ORDER — TOPIRAMATE 25 MG PO TABS: 25.0000 mg | ORAL_TABLET | Freq: Two times a day (BID) | ORAL | Status: DC

## 2013-01-26 MED ORDER — SODIUM CHLORIDE 0.9 % IJ SOLN
3.0000 mL | Freq: Two times a day (BID) | INTRAMUSCULAR | Status: DC
  Administered 2013-01-26: 3 mL via INTRAVENOUS

## 2013-01-26 MED ORDER — ONDANSETRON HCL 4 MG PO TABS: 4.0000 mg | ORAL_TABLET | Freq: Four times a day (QID) | ORAL | Status: DC | PRN

## 2013-01-26 MED ORDER — SERTRALINE HCL 100 MG PO TABS
200.0000 mg | ORAL_TABLET | Freq: Every day | ORAL | Status: DC
  Administered 2013-01-27 – 2013-01-29 (×4): 200 mg via ORAL
  Filled 2013-01-26 (×4): qty 2

## 2013-01-26 MED ORDER — B COMPLEX-C PO TABS
1.0000 | ORAL_TABLET | Freq: Every day | ORAL | Status: DC
  Administered 2013-01-27 – 2013-01-29 (×3): 1 via ORAL
  Filled 2013-01-26 (×3): qty 1

## 2013-01-26 MED ORDER — DEXTROSE-NACL 5-0.9 % IV SOLN
INTRAVENOUS | Status: DC
  Administered 2013-01-26 – 2013-01-29 (×6): via INTRAVENOUS

## 2013-01-26 MED ORDER — POLYETHYLENE GLYCOL 3350 17 G PO PACK
17.0000 g | PACK | Freq: Every day | ORAL | Status: DC | PRN
  Filled 2013-01-26: qty 1

## 2013-01-26 MED ORDER — ASPIRIN 81 MG PO CHEW
81.0000 mg | CHEWABLE_TABLET | Freq: Every day | ORAL | Status: DC
  Administered 2013-01-27 – 2013-01-29 (×4): 81 mg via ORAL
  Filled 2013-01-26 (×5): qty 1

## 2013-01-26 MED ORDER — TESTOSTERONE 50 MG/5GM (1%) TD GEL
5.0000 g | Freq: Every day | TRANSDERMAL | Status: DC
  Administered 2013-01-27 – 2013-01-29 (×4): 5 g via TRANSDERMAL
  Filled 2013-01-26 (×4): qty 5

## 2013-01-26 MED ORDER — HEPARIN SODIUM (PORCINE) 5000 UNIT/ML IJ SOLN
5000.0000 [IU] | Freq: Three times a day (TID) | INTRAMUSCULAR | Status: DC
  Administered 2013-01-26 – 2013-01-29 (×7): 5000 [IU] via SUBCUTANEOUS
  Filled 2013-01-26 (×11): qty 1

## 2013-01-26 MED ORDER — BUPROPION HCL ER (XL) 300 MG PO TB24
300.0000 mg | ORAL_TABLET | Freq: Every day | ORAL | Status: DC
  Administered 2013-01-27 – 2013-01-29 (×3): 300 mg via ORAL
  Filled 2013-01-26 (×3): qty 1

## 2013-01-26 MED ORDER — MIRTAZAPINE 45 MG PO TABS
45.0000 mg | ORAL_TABLET | Freq: Every day | ORAL | Status: DC
  Administered 2013-01-27 – 2013-01-28 (×3): 45 mg via ORAL
  Filled 2013-01-26 (×4): qty 1

## 2013-01-26 NOTE — ED Notes (Signed)
Dr Le at bedside.  

## 2013-01-26 NOTE — Progress Notes (Signed)
   CARE MANAGEMENT ED NOTE 01/26/2013  Patient:  Jose Leach, Jose Leach   Account Number:  [id number]  Date Initiated:  01/26/2013  Documentation initiated by:  Radford Pax  Subjective/Objective Assessment:   Patient presents to the ED with increased weakness, constipation and acute kidney injury.     Subjective/Objective Assessment Detail:   Patient with decreased po intake, diempacted three days ago by GI doctor.     Action/Plan:   Action/Plan Detail:   Anticipated DC Date:       Status Recommendation to Physician:   Result of Recommendation:    Other ED Services  Consult Working Plan    DC Planning Services  Other  PCP issues    Choice offered to / List presented to:            Status of service:  Completed, signed off  ED Comments:   ED Comments Detail:  EDCM consulted to see patient regarding possible home health needs.  Patient and wife live in independent facility [facility].  Patient's wife Jose Leach looking for home health PT for the patient.  Patient's wife reports he has a walker at home.  Patient currently does  not have any home health services at all.  Patient's wife also interested in obtaining a wheelchair for patient upon discharge.  EDCM provided patient's wife a list of all the home health agencies in [place].  Also provided patient's wife list of private duty services and explained it may be an out of pocket expense for the patient. Expalined to patient and his wife that with home health srvices he may receive a visiting RN, PT, OT, aide and social worker if needed.  Patient and patient's wife thankful for resources.  No further CM needs at this time.
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
## 2013-01-26 NOTE — H&P (Signed)
[facility] History and Physical  Jose Leach ZOX:[id number] DOB: 1932-12-11    PCP:   [name], MD   Chief Complaint: weakness, not taking fluid.  HPI: Jose Leach is an 79 y.o. male with hx of hypotestosteronemia on topical suplement, ataxia, migraine HA controlled with Topamax, sinus bradycardia, coratid disease s/p CEA, s/p Lap bil hernia repair 01/16/13, presents to the ER wih increase weakness, decrease appetite, and constipation.  He was seen by GI and had disimpaction and had a BM yesterday.  He has been taking his pain pills, up to 3 a day according to his wife, but none the last 2 days.  He denied fever, chills, nausea, vomiting or abdominal pain.  He does have ataxia, and frequent falls, but this is not new.  Family was concerned that he is not moving around enough, and last hospitalization, he became deconditioned requiring STR.  Evalaution in the ER with CXR showed no infiltrate, but slight ileus seen.  His Cr has increase to 2.6 from baseline of 1.3, with BUN elevated to 75, but Na is normal.  He has no leukocytosis, and his Hb is 11.4 grams per dL.  Hospitalist was asked to admit him for AKI, due to volume depletion, and constipation with possible narcotic induced ileus s/p recent surgery.  Rewiew of Systems:  Constitutional: Negative for malaise, fever and chills. No significant weight loss or weight gain Eyes: Negative for eye pain, redness and discharge, diplopia, visual changes, or flashes of light. ENMT: Negative for ear pain, hoarseness, nasal congestion, sinus pressure and sore throat. No headaches; tinnitus, drooling, or problem swallowing. Cardiovascular: Negative for chest pain, palpitations, diaphoresis, dyspnea and peripheral edema. ; No orthopnea, PND Respiratory: Negative for cough, hemoptysis, wheezing and stridor. No pleuritic chestpain. Gastrointestinal: Negative for nausea, vomiting, diarrhea,abdominal pain, melena, blood in stool,  hematemesis, jaundice and rectal bleeding.    Genitourinary: Negative for frequency, dysuria, incontinence,flank pain and hematuria; Musculoskeletal: Negative for back pain and neck pain. Negative for swelling and trauma.;  Skin: . Negative for pruritus, rash, abrasions, bruising and skin lesion.; ulcerations Neuro: Negative for headache, lightheadedness and neck stiffness. Negative for weakness, altered level of consciousness , altered mental status, extremity weakness, burning feet, involuntary movement, seizure and syncope.  Psych: negative for anxiety,  insomnia, tearfulness, panic attacks, hallucinations, paranoia, suicidal or homicidal ideation    Past Medical History  Diagnosis Date  . Migraines     Dr [name]  . Peripheral neuropathy   . Benign essential tremor   . Cervical stenosis of spine   . Anemia     B12 deficiency  . Testosterone deficiency     Dr [name], [facility]  . Hypothyroidism   . Hyperlipidemia   . Depression     Dr [name]  . History of shingles 2009  . H/O cardiovascular stress test     a. 02/2003 -> no ischemia/infarct.  Marland Kitchen Hx of echocardiogram     Echocardiogram 08/01/12: Mild LVH, EF 55-60%, normal wall motion.  . Sinus bradycardia   . PAD (peripheral artery disease)   . Carotid stenosis     s/p L CEA  . Fecal impaction 10/11/2012    Past Surgical History  Procedure Laterality Date  . Appendectomy  1941  . Lumbar laminectomy    . Carotid endarterectomy       L  . Tonsillectomy and adenoidectomy    . Total hip arthroplasty      L  . Cataract extraction Right   .  Eye surgery Right 02/2012    retina  . Inguinal hernia repair Bilateral 01/16/2013    Procedure: LAPAROSCOPIC BILATERAL INGUINAL DIRECT AND INDIRECT, FEMORAL, AND OBTURATOR HERNIAS;  Surgeon: Ardeth Sportsman, MD;  Location: [facility] OR;  Service: General;  Laterality: Bilateral;  . Insertion of mesh N/A 01/16/2013    Procedure: INSERTION OF MESH;  Surgeon: Ardeth Sportsman, MD;  Location: [facility] OR;   Service: General;  Laterality: N/A;  . Breast cyst excision Left 01/16/2013    Procedure: MASS EXCISION AXILLA;  Surgeon: Ardeth Sportsman, MD;  Location[facility] OR;  Service: General;  Laterality: Left;    Medications:  HOME MEDS: Prior to Admission medications   Medication Sig Start Date End Date Taking? Authorizing Provider  aspirin 81 MG tablet Take 81 mg by mouth daily.   Yes Historical Provider, MD  atorvastatin (LIPITOR) 20 MG tablet Take 20 mg by mouth daily.   Yes Historical Provider, MD  b complex vitamins capsule Take 1 capsule by mouth daily.   Yes Historical Provider, MD  bimatoprost (LUMIGAN) 0.01 % SOLN Place 1 drop into both eyes at bedtime.   Yes Historical Provider, MD  buPROPion (WELLBUTRIN XL) 300 MG 24 hr tablet Take 300 mg by mouth daily.   Yes Historical Provider, MD  Cholecalciferol (VITAMIN D3) 2000 UNITS TABS Take 4,000 Units by mouth daily.    Yes Historical Provider, MD  clonazePAM (KLONOPIN) 0.5 MG tablet Take 0.5 mg by mouth 3 (three) times daily as needed for anxiety.  01/03/13  Yes Historical Provider, MD  Flaxseed, Linseed, (FLAX SEEDS PO) Take 1 tablet by mouth daily.    Yes Historical Provider, MD  [name] Oil 500 MG CAPS Take 500 mg by mouth daily.    Yes Historical Provider, MD  levothyroxine (SYNTHROID, LEVOTHROID) 50 MCG tablet Take 1 tablet (50 mcg total) by mouth daily before breakfast. 1/2 on Sunday; 1 all other days 12/20/12  Yes Pecola Lawless, MD  Linaclotide [facility]) 290 MCG CAPS capsule Take 1 capsule (290 mcg total) by mouth daily. 12/14/12  Yes Amy S Esterwood, PA-C  mirtazapine (REMERON) 45 MG tablet Take 45 mg by mouth at bedtime.   Yes Historical Provider, MD  Multiple Vitamins-Minerals (ICAPS PO) Take 1 capsule by mouth 2 (two) times daily.    Yes Historical Provider, MD  mupirocin ointment (BACTROBAN) 2 % Applied twice a day to the affected area;NOT into eyes. 11/08/12  Yes Pecola Lawless, MD  oxyCODONE (OXY IR/ROXICODONE) 5 MG immediate  release tablet Take 1-2 tablets (5-10 mg total) by mouth every 6 (six) hours as needed for moderate pain or severe pain. 01/16/13  Yes Ardeth Sportsman, MD  polyethylene glycol [facility] / GLYCOLAX) packet Take 17 g by mouth daily as needed for mild constipation. 3 capfuls daily   Yes Historical Provider, MD  sertraline (ZOLOFT) 100 MG tablet Take 200 mg by mouth daily.    Yes Historical Provider, MD  tamsulosin (FLOMAX) 0.4 MG CAPS capsule Take 0.4 mg by mouth daily.   Yes Historical Provider, MD  testosterone ([name]) 50 MG/5GM GEL Place 5 g onto the skin daily.   Yes Historical Provider, MD  topiramate (TOPAMAX) 25 MG tablet Take 25-50 mg by mouth 2 (two) times daily. 1 tablet (25mg ) in the morning and 2 tablets (50mg ) in the evening.   Yes Historical Provider, MD  vitamin B-12 (CYANOCOBALAMIN) 1000 MCG tablet Take 1,000 mcg by mouth daily.   Yes Historical Provider, MD  Allergies:  Allergies  Allergen Reactions  . Latex     REACTION: rash from adhesive  . Tape Rash    Paper tape is ok to use     Social History:   reports that he quit smoking about 33 years ago. His smoking use included Pipe and Cigars. He has never used smokeless tobacco. He reports that he drinks about 4.2 ounces of alcohol per week. He reports that he does not use illicit drugs.  Family History: Family History  Problem Relation Age of Onset  . Stroke Mother 44  . Hypertension Mother   . Diabetes Mother   . Rectal cancer Father 98  . Heart disease Father     in 39s  . Cancer Father     colorectal  . Nephritis Sister   . Seizures Brother     died as child     Physical Exam: Filed Vitals:   01/26/13 1752  BP: 151/74  Temp: 98.2 F (36.8 C)  TempSrc: Oral  Resp: 16  SpO2: 98%   Blood pressure 151/74, temperature 98.2 F (36.8 C), temperature source Oral, resp. rate 16, SpO2 98.00%.  GEN:  Pleasant  patient lying in the stretcher in no acute distress; cooperative with exam. PSYCH:  alert and  oriented x4; does not appear anxious or depressed; affect is appropriate. HEENT: Mucous membranes pink and anicteric; PERRLA; EOM intact; no cervical lymphadenopathy nor thyromegaly or carotid bruit; no JVD; There were no stridor. Neck is very supple. Breasts:: Not examined CHEST WALL: No tenderness CHEST: Normal respiration, clear to auscultation bilaterally.  HEART: Regular rate and rhythm.  There are no murmur, rub, or gallops.   BACK: No kyphosis or scoliosis; no CVA tenderness ABDOMEN: soft and non-tender; no masses, no organomegaly, normal abdominal bowel sounds; no pannus; no intertriginous candida. There is no rebound and no distention. Rectal Exam: Not done EXTREMITIES: No bone or joint deformity; age-appropriate arthropathy of the hands and knees; no edema; no ulcerations.  There is no calf tenderness. Genitalia: not examined PULSES: 2+ and symmetric SKIN: Normal hydration no rash or ulceration CNS: Cranial nerves 2-12 grossly intact no focal lateralizing neurologic deficit.  Speech is fluent; uvula elevated with phonation, facial symmetry and tongue midline. DTR are normal bilaterally, cerebella exam is intact, barbinski is negative and strengths are equaled bilaterally.  No sensory loss.   Labs on Admission:  Basic Metabolic Panel:  Recent Labs Lab 01/26/13 1900  NA 138  K 3.9  CL 106  CO2 20  GLUCOSE 112*  BUN 75*  CREATININE 2.63*  CALCIUM 8.9   Liver Function Tests:  Recent Labs Lab 01/26/13 1900  AST 16  ALT 14  ALKPHOS 49  BILITOT 0.7  PROT 6.1  ALBUMIN 2.7*    Recent Labs Lab 01/26/13 1900  LIPASE 15   No results found for this basename: AMMONIA,  in the last 168 hours CBC:  Recent Labs Lab 01/26/13 1855  WBC 6.6  NEUTROABS 4.9  HGB 11.4*  HCT 35.2*  MCV 91.2  PLT 196   Cardiac Enzymes:  Recent Labs Lab 01/26/13 1920  TROPONINI <0.30    CBG: No results found for this basename: GLUCAP,  in the last 168 hours   Radiological  Exams on Admission: Dg Abd Acute W/chest  01/26/2013   CLINICAL DATA:  Abdominal pain. Weakness. Postop from hernia repair 10 days ago.  EXAM: ACUTE ABDOMEN SERIES (ABDOMEN 2 VIEW & CHEST 1 VIEW)  COMPARISON:  Chest radiograph on 07/30/2012  FINDINGS: Mild gaseous distention of small bowel and colon noted, consistent with mild postop ileus. No radiopaque calculi or other significant radiographic abnormality is seen. Left hip prosthesis noted.  Heart size and mediastinal contours are within normal limits. Both lungs are clear.  IMPRESSION: Mild postop ileus pattern.  No active cardiopulmonary disease.   Electronically Signed   By: Myles Rosenthal M.D.   On: 01/26/2013 19:54    Assessment/Plan Present on Admission:  . AKI (acute kidney injury) . Volume depletion . Difficulty in walking(719.7) . HTN (hypertension) . HYPOTHYROIDISM . HYPERLIPIDEMIA . Inguinal hernia recurrent bilateral s/p lap repair 01/16/2013 . Fecal impaction . MIGRAINE HEADACHE . Weakness   PLAN:  Will admit him for AKI, from volume depletion with doubling of his Cr value.  He also has an slight ileus from narcotic use, but not bad as I can clearly hear bowel sounds.   Will give IVF and follow Cr.  I have continued his meds, but will d/c narcotic.  He doesn't think he needs them now, and hadn't taken any of them for the past couple of days.  Also will check TSH.  As requested by family, will consult PT, and be sure to get him OOB with assistance.  He is stable, full code, and will be admitted to [facility] service.  Thank you for allowing [place] to participate in his care.  Other plans as per orders.  Code Status: FULL Unk Lightning, MD. [facility] Pager [phone] 7pm to 7am.  01/26/2013, 9:46 PM

## 2013-01-26 NOTE — ED Provider Notes (Signed)
CSN: [id number]     Arrival date & time 01/26/13  1751 History   First MD Initiated Contact with Patient 01/26/13 1819     Chief Complaint  Patient presents with  . Weakness   (Consider location/radiation/quality/duration/timing/severity/associated sxs/prior Treatment) The history is provided by the patient.  SAKSHAM AKKERMAN is a 79 y.o. male history of peripheral artery disease, recent hernia repair, constipation here presenting with constipation and weakness. States that since the hernia repair on December 2, he's been having decreased appetite. Also has been constipated recently. He went to GI doctor 3 days ago and had to be disimpacted. Currently on Linzess and miralax outpatient.  She had some bowel movement today. However he feels progressively weaker since he hasn't been eating much. Denies any chest pain or shortness of breath or vomiting or fevers.    Past Medical History  Diagnosis Date  . Migraines     Dr [name]  . Peripheral neuropathy   . Benign essential tremor   . Cervical stenosis of spine   . Anemia     B12 deficiency  . Testosterone deficiency     Dr [name], [facility]  . Hypothyroidism   . Hyperlipidemia   . Depression     Dr [name]  . History of shingles 2009  . H/O cardiovascular stress test     a. 02/2003 -> no ischemia/infarct.  Marland Kitchen Hx of echocardiogram     Echocardiogram 08/01/12: Mild LVH, EF 55-60%, normal wall motion.  . Sinus bradycardia   . PAD (peripheral artery disease)   . Carotid stenosis     s/p L CEA  . Fecal impaction 10/11/2012   Past Surgical History  Procedure Laterality Date  . Appendectomy  1941  . Lumbar laminectomy    . Carotid endarterectomy       L  . Tonsillectomy and adenoidectomy    . Total hip arthroplasty      L  . Cataract extraction Right   . Eye surgery Right 02/2012    retina  . Inguinal hernia repair Bilateral 01/16/2013    Procedure: LAPAROSCOPIC BILATERAL INGUINAL DIRECT AND INDIRECT, FEMORAL, AND OBTURATOR  HERNIAS;  Surgeon: Ardeth Sportsman, MD;  Location: [facility] OR;  Service: General;  Laterality: Bilateral;  . Insertion of mesh N/A 01/16/2013    Procedure: INSERTION OF MESH;  Surgeon: Ardeth Sportsman, MD;  Location: [facility] OR;  Service: General;  Laterality: N/A;  . Breast cyst excision Left 01/16/2013    Procedure: MASS EXCISION AXILLA;  Surgeon: Ardeth Sportsman, MD;  Location: [facility] OR;  Service: General;  Laterality: Left;   Family History  Problem Relation Age of Onset  . Stroke Mother 28  . Hypertension Mother   . Diabetes Mother   . Rectal cancer Father 73  . Heart disease Father     in 64s  . Cancer Father     colorectal  . Nephritis Sister   . Seizures Brother     died as child   History  Substance Use Topics  . Smoking status: Former Smoker -- 10 years    Types: Pipe, Cigars    Quit date: 02/16/1979  . Smokeless tobacco: Never Used     Comment: Quit about age 75   . Alcohol Use: 4.2 oz/week    7 Glasses of wine per week     Comment:      Review of Systems  Gastrointestinal: Positive for nausea.  Neurological: Positive for weakness.  All other systems reviewed  and are negative.    Allergies  Latex and Tape  Home Medications   Current Outpatient Rx  Name  Route  Sig  Dispense  Refill  . aspirin 81 MG tablet   Oral   Take 81 mg by mouth daily.         Marland Kitchen atorvastatin (LIPITOR) 20 MG tablet   Oral   Take 20 mg by mouth daily.         Marland Kitchen b complex vitamins capsule   Oral   Take 1 capsule by mouth daily.         . bimatoprost (LUMIGAN) 0.01 % SOLN   Both Eyes   Place 1 drop into both eyes at bedtime.         Marland Kitchen buPROPion (WELLBUTRIN XL) 300 MG 24 hr tablet   Oral   Take 300 mg by mouth daily.         . Cholecalciferol (VITAMIN D3) 2000 UNITS TABS   Oral   Take 4,000 Units by mouth daily.          . clonazePAM (KLONOPIN) 0.5 MG tablet   Oral   Take 0.5 mg by mouth 3 (three) times daily as needed for anxiety.          . Flaxseed, Linseed, (FLAX  SEEDS PO)   Oral   Take 1 tablet by mouth daily.          [name] Oil 500 MG CAPS   Oral   Take 500 mg by mouth daily.          Marland Kitchen levothyroxine (SYNTHROID, LEVOTHROID) 50 MCG tablet   Oral   Take 1 tablet (50 mcg total) by mouth daily before breakfast. 1/2 on Sunday; 1 all other days   90 tablet   1   . Linaclotide (LINZESS) 290 MCG CAPS capsule   Oral   Take 1 capsule (290 mcg total) by mouth daily.   20 capsule   0     Samples given to patient    Lot#   [id number]         ...   . mirtazapine (REMERON) 45 MG tablet   Oral   Take 45 mg by mouth at bedtime.         . Multiple Vitamins-Minerals (ICAPS PO)   Oral   Take 1 capsule by mouth 2 (two) times daily.          . mupirocin ointment (BACTROBAN) 2 %      Applied twice a day to the affected area;NOT into eyes.   22 g   0   . oxyCODONE (OXY IR/ROXICODONE) 5 MG immediate release tablet   Oral   Take 1-2 tablets (5-10 mg total) by mouth every 6 (six) hours as needed for moderate pain or severe pain.   40 tablet   0   . polyethylene glycol (MIRALAX / GLYCOLAX) packet   Oral   Take 17 g by mouth daily as needed for mild constipation. 3 capfuls daily         . sertraline (ZOLOFT) 100 MG tablet   Oral   Take 200 mg by mouth daily.          . tamsulosin (FLOMAX) 0.4 MG CAPS capsule   Oral   Take 0.4 mg by mouth daily.         Marland Kitchen testosterone ([name]) 50 MG/5GM GEL   Transdermal   Place 5 g onto the skin daily.         Marland Kitchen  topiramate (TOPAMAX) 25 MG tablet   Oral   Take 25-50 mg by mouth 2 (two) times daily. 1 tablet (25mg ) in the morning and 2 tablets (50mg ) in the evening.         . vitamin B-12 (CYANOCOBALAMIN) 1000 MCG tablet   Oral   Take 1,000 mcg by mouth daily.          BP 151/74  Temp(Src) 98.2 F (36.8 C) (Oral)  Resp 16  SpO2 98% Physical Exam  Nursing note and vitals reviewed. Constitutional: He is oriented to person, place, and time.  Chronically ill, slightly dehydrated    HENT:  Head: Normocephalic.  MM slightly dry   Eyes: Conjunctivae are normal. Pupils are equal, round, and reactive to light.  Neck: Normal range of motion. Neck supple.  Cardiovascular: Normal rate, regular rhythm and normal heart sounds.   Pulmonary/Chest: Effort normal and breath sounds normal. No respiratory distress. He has no wheezes. He has no rales.  Abdominal: Soft. Bowel sounds are normal.  Surgical scar well healed. nontender abdomen.   Genitourinary:  Rectal- no stool impaction   Musculoskeletal: Normal range of motion.  Neurological: He is alert and oriented to person, place, and time.  Skin: Skin is warm and dry.  Psychiatric: He has a normal mood and affect. His behavior is normal. Judgment and thought content normal.    ED Course  Procedures (including critical care time) Labs Review Labs Reviewed  CBC WITH DIFFERENTIAL - Abnormal; Notable for the following:    RBC 3.86 (*)    Hemoglobin 11.4 (*)    HCT 35.2 (*)    Lymphocytes Relative 11 (*)    Monocytes Relative 14 (*)    All other components within normal limits  COMPREHENSIVE METABOLIC PANEL - Abnormal; Notable for the following:    Glucose, Bld 112 (*)    BUN 75 (*)    Creatinine, Ser 2.63 (*)    Albumin 2.7 (*)    GFR calc non Af Amer 22 (*)    GFR calc Af Amer 25 (*)    All other components within normal limits  LIPASE, BLOOD  TROPONIN I  URINALYSIS, ROUTINE W REFLEX MICROSCOPIC   Imaging Review Dg Abd Acute W/chest  01/26/2013   CLINICAL DATA:  Abdominal pain. Weakness. Postop from hernia repair 10 days ago.  EXAM: ACUTE ABDOMEN SERIES (ABDOMEN 2 VIEW & CHEST 1 VIEW)  COMPARISON:  Chest radiograph on 07/30/2012  FINDINGS: Mild gaseous distention of small bowel and colon noted, consistent with mild postop ileus. No radiopaque calculi or other significant radiographic abnormality is seen. Left hip prosthesis noted.  Heart size and mediastinal contours are within normal limits. Both lungs are clear.   IMPRESSION: Mild postop ileus pattern.  No active cardiopulmonary disease.   Electronically Signed   By: Myles Rosenthal M.D.   On: 01/26/2013 19:54    EKG Interpretation    Date/Time:  Friday January 26 2013 18:05:53 EST Ventricular Rate:  68 PR Interval:    QRS Duration: 102 QT Interval:  423 QTC Calculation: 450 R Axis:   3 Text Interpretation:  Normal sinus rhythm No significant change since last tracing Confirmed by YAO  MD, DAVID [phone]) on 01/26/2013 6:46:40 PM            MDM  No diagnosis found. CRAIG IONESCU is a 79 y.o. male here with ab pain, dec PO intake, constipation. Consider possible ileus and low suspicion for SBO. Also consider UTI vs pneumonia given  weakness. Will get labs, xrays, UA. Will hydrate and reassess.   9:12 PM Patient has acute renal failure due to dehydration and postop ileus on xray. Will admit for hydration. I called surgery, who recommend medicine admission and will see patient in AM.     Richardean Canal, MD 01/26/13 2113

## 2013-01-26 NOTE — ED Notes (Signed)
Pt unable to give urine sample,  Pt started on 2nd liter of NS

## 2013-01-26 NOTE — ED Notes (Signed)
Per EMS pt from [facility] with c/o generalized weakness/malaise ever since hernia repair surgery on December 2. Decreased appetite/fluid intake. No abdominal pain, nausea/vomiting/diarrhea. IV 20 LFA. CBG 96. Alert and oriented x 4.

## 2013-01-27 ENCOUNTER — Inpatient Hospital Stay (HOSPITAL_COMMUNITY): Payer: Medicare Other

## 2013-01-27 DIAGNOSIS — K5900 Constipation, unspecified: Secondary | ICD-10-CM

## 2013-01-27 DIAGNOSIS — I10 Essential (primary) hypertension: Secondary | ICD-10-CM

## 2013-01-27 DIAGNOSIS — E039 Hypothyroidism, unspecified: Secondary | ICD-10-CM

## 2013-01-27 DIAGNOSIS — N189 Chronic kidney disease, unspecified: Secondary | ICD-10-CM

## 2013-01-27 DIAGNOSIS — R5381 Other malaise: Secondary | ICD-10-CM

## 2013-01-27 DIAGNOSIS — R262 Difficulty in walking, not elsewhere classified: Secondary | ICD-10-CM

## 2013-01-27 LAB — BASIC METABOLIC PANEL
CO2: 19 mEq/L (ref 19–32)
Calcium: 8 mg/dL — ABNORMAL LOW (ref 8.4–10.5)
Chloride: 109 mEq/L (ref 96–112)
Creatinine, Ser: 2.85 mg/dL — ABNORMAL HIGH (ref 0.50–1.35)
GFR calc Af Amer: 23 mL/min — ABNORMAL LOW (ref >90)
GFR calc non Af Amer: 20 mL/min — ABNORMAL LOW (ref >90)

## 2013-01-27 LAB — TSH: TSH: 2.756 u[IU]/mL (ref 0.350–4.500)

## 2013-01-27 LAB — SODIUM, URINE, RANDOM: Sodium, Ur: 27 mEq/L

## 2013-01-27 MED ORDER — FLEET ENEMA 7-19 GM/118ML RE ENEM
1.0000 | ENEMA | Freq: Once | RECTAL | Status: AC
  Administered 2013-01-27: 1 via RECTAL
  Filled 2013-01-27: qty 1

## 2013-01-27 MED ORDER — BIOTENE DRY MOUTH MT LIQD
15.0000 mL | Freq: Two times a day (BID) | OROMUCOSAL | Status: DC
  Administered 2013-01-27 – 2013-01-29 (×5): 15 mL via OROMUCOSAL

## 2013-01-27 NOTE — Progress Notes (Signed)
Attempt by 2 nurses to place foley catheter were unsuccessful.MD notified, orders received for coude cath.  One attempt unsuccessful as well.  Notified MD as pt wished to rest awhile before any other attempts were made.  Voided 50 cc of urine in urinal.  Will continue to monitor.

## 2013-01-27 NOTE — Progress Notes (Signed)
[facility] PROGRESS NOTE  Jose Leach ZOX:[id number] DOB: 1932-04-04 DOA: 01/26/2013 PCP: [name], MD  Assessment/Plan: 1. Acute kidney injury - Most likely due to poor oral intake - Continue IV fluids at this point and reassess next a.m. - Obtain urine sodium and urine creatinine to calculate Fina - Given age will obtain renal ultrasound  2. Difficulty walking - Physical therapy consult at and currently we are awaiting recommendations  3. Fecal impaction - Patient had enema and has had subsequent bowel movements  4. Hypothyroidism - TSH within normal limits  5. Weakness - Most likely recently 2 to poor oral intake as mentioned above awaiting physical therapy evaluation  6. history of inguinal hernia recurrent with bilateral status post lap repair - General surgery on board and would like to thank general surgery for their evaluation  Code Status: full Family Communication: No family at bedside. Discussed with patient. Disposition Plan: Pending improvement in renal function   Consultants:  General surgeon  Procedures:  None  Antibiotics:  None  HPI/Subjective: No new complaints. Patient reported no new problems overnight. Reportedly patient was having bowel movement earlier today  Objective: Filed Vitals:   01/27/13 0528  BP: 146/71  Pulse: 67  Temp: 98 F (36.7 C)  Resp: 16    Intake/Output Summary (Last 24 hours) at 01/27/13 1229 Last data filed at 01/27/13 1200  Gross per 24 hour  Intake 2786.33 ml  Output    351 ml  Net 2435.33 ml   Filed Weights   01/26/13 2300 01/26/13 2326  Weight: 72.077 kg (158 lb 14.4 oz) 72.077 kg (158 lb 14.4 oz)    Exam:   General:  Patient in no acute distress, alert and awake  Cardiovascular: Regular rate and rhythm, no murmurs rubs or gallops  Respiratory: Clear to auscultation bilaterally, no wheezes  Abdomen: Soft, nondistended  Musculoskeletal: No cyanosis or clubbing   Data  Reviewed: Basic Metabolic Panel:  Recent Labs Lab 01/26/13 1900 01/27/13 0500  NA 138 139  K 3.9 4.0  CL 106 109  CO2 20 19  GLUCOSE 112* 148*  BUN 75* 69*  CREATININE 2.63* 2.85*  CALCIUM 8.9 8.0*   Liver Function Tests:  Recent Labs Lab 01/26/13 1900  AST 16  ALT 14  ALKPHOS 49  BILITOT 0.7  PROT 6.1  ALBUMIN 2.7*    Recent Labs Lab 01/26/13 1900  LIPASE 15   No results found for this basename: AMMONIA,  in the last 168 hours CBC:  Recent Labs Lab 01/26/13 1855  WBC 6.6  NEUTROABS 4.9  HGB 11.4*  HCT 35.2*  MCV 91.2  PLT 196   Cardiac Enzymes:  Recent Labs Lab 01/26/13 1920  TROPONINI <0.30   BNP (last 3 results)  Recent Labs  07/31/12 0202  PROBNP 394.0   CBG: No results found for this basename: GLUCAP,  in the last 168 hours  No results found for this or any previous visit (from the past 240 hour(s)).   Studies: Dg Abd Acute W/chest  01/26/2013   CLINICAL DATA:  Abdominal pain. Weakness. Postop from hernia repair 10 days ago.  EXAM: ACUTE ABDOMEN SERIES (ABDOMEN 2 VIEW & CHEST 1 VIEW)  COMPARISON:  Chest radiograph on 07/30/2012  FINDINGS: Mild gaseous distention of small bowel and colon noted, consistent with mild postop ileus. No radiopaque calculi or other significant radiographic abnormality is seen. Left hip prosthesis noted.  Heart size and mediastinal contours are within normal limits. Both lungs are clear.  IMPRESSION: Mild postop ileus pattern.  No active cardiopulmonary disease.   Electronically Signed   By: Myles Rosenthal M.D.   On: 01/26/2013 19:54    Scheduled Meds: . antiseptic oral rinse  15 mL Mouth Rinse BID  . aspirin  81 mg Oral Daily  . atorvastatin  20 mg Oral q1800  . B-complex with vitamin C  1 tablet Oral Daily  . buPROPion  300 mg Oral Daily  . cholecalciferol  4,000 Units Oral Daily  . heparin  5,000 Units Subcutaneous Q8H  . latanoprost  1 drop Both Eyes QHS  . [START ON 01/28/2013] levothyroxine  25 mcg Oral  Custom  . levothyroxine  50 mcg Oral Custom  . Linaclotide  290 mcg Oral Daily  . mirtazapine  45 mg Oral QHS  . sertraline  200 mg Oral Daily  . sodium chloride  3 mL Intravenous Q12H  . tamsulosin  0.4 mg Oral Daily  . testosterone  5 g Transdermal Daily  . topiramate  25 mg Oral Daily  . topiramate  50 mg Oral QHS  . vitamin B-12  1,000 mcg Oral Daily   Continuous Infusions: . dextrose 5 % and 0.9% NaCl 100 mL/hr at 01/27/13 0907    Principal Problem:   AKI (acute kidney injury) Active Problems:   HYPOTHYROIDISM   HYPERLIPIDEMIA   MIGRAINE HEADACHE   Difficulty in walking(719.7)   Weakness   HTN (hypertension)   Inguinal hernia recurrent bilateral s/p lap repair 01/16/2013   Fecal impaction   Volume depletion    Time spent: > 35 minutes    [name]  [facility] Pager [phone]. If 7PM-7AM, please contact night-coverage at [email or web address], password [facility] 01/27/2013, 12:29 PM  LOS: 1 day

## 2013-01-27 NOTE — Evaluation (Signed)
Physical Therapy Evaluation Patient Details Name: KIMI KROFT MRN: [id number] DOB: 1932-08-18 Today's Date: 01/27/2013 Time: 1202-1220 PT Time Calculation (min): 18 min  PT Assessment / Plan / Recommendation History of Present Illness  KINTA MARTIS is an 79 y.o. male with hx of hypotestosteronemia on topical suplement, ataxia, migraine HA controlled with Topamax, sinus bradycardia, coratid disease s/p CEA, s/p Lap bil hernia repair 01/16/13, presents to the ER wih increase weakness, decrease appetite, and constipation.  He was seen by GI and had disimpaction and had a BM yesterday.  He has been taking his pain pills, up to 3 a day according to his wife, but none the last 2 days.  He denied fever, chills, nausea, vomiting or abdominal pain.  He does have ataxia, and frequent falls, but this is not new.  Family was concerned that he is not moving around enough, and last hospitalization, he became deconditioned requiring STR.  Evalaution in the ER with CXR showed no infiltrate, but slight ileus seen.  His Cr has increase to 2.6 from baseline of 1.3, with BUN elevated to 75, but Na is normal.    Hospitalist was asked to admit him for AKI, due to volume depletion, and constipation with possible narcotic induced ileus s/p recent surgery.  Clinical Impression  Pt unsteady in ambulation and mobility. Pt will benefit from PT to address problems listed below. Wife reports she is unable to provide any support. Recommend SNF.   PT Assessment  Patient needs continued PT services    Follow Up Recommendations  SNF    Does the patient have the potential to tolerate intense rehabilitation      Barriers to Discharge        Equipment Recommendations  None recommended by PT    Recommendations for Other Services     Frequency Min 3X/week    Precautions / Restrictions Precautions Precautions: Fall Restrictions Weight Bearing Restrictions: No   Pertinent Vitals/Pain No c/o       Mobility  Bed Mobility Bed Mobility: Supine to Sit;Sitting - Scoot to Edge of Bed Supine to Sit: HOB elevated;With rails;3: Mod assist Sitting - Scoot to Edge of Bed: 4: Min assist Details for Bed Mobility Assistance: extra time to mobilize to edge,  Transfers Transfers: Sit to Stand;Stand to Sit Sit to Stand: 3: Mod assist;From bed;From chair/3-in-1;With upper extremity assist Stand to Sit: With upper extremity assist;To chair/3-in-1;3: Mod assist Details for Transfer Assistance: steady assistance upon rising from surfaces, cues for UE use to push off/reach back to armrests. Ambulation/Gait Ambulation/Gait Assistance: 1: +2 Total assist (safety) Ambulation/Gait: Patient Percentage: 70% Ambulation Distance (Feet): 50 Feet Assistive device: Rolling walker Gait Pattern: Step-through pattern;Trunk flexed;Decreased stride length Gait velocity: decr.    Exercises     PT Diagnosis: Difficulty walking;Generalized weakness  PT Problem List: Decreased strength;Decreased activity tolerance;Decreased balance;Decreased mobility;Decreased knowledge of use of DME;Decreased safety awareness;Decreased knowledge of precautions PT Treatment Interventions: DME instruction;Gait training;Functional mobility training;Therapeutic activities;Therapeutic exercise;Patient/family education     PT Goals(Current goals can be found in the care plan section) Acute Rehab PT Goals Patient Stated Goal: none stated PT Goal Formulation: With patient/family Time For Goal Achievement: 02/10/13 Potential to Achieve Goals: Good  Visit Information  Last PT Received On: 01/27/13 Assistance Needed: +1 PT/OT/SLP Co-Evaluation/Treatment: Yes Reason for Co-Treatment: For patient/therapist safety PT goals addressed during session: Mobility/safety with mobility;Balance;Proper use of DME;Strengthening/ROM OT goals addressed during session: ADL's and self-care;Proper use of Adaptive equipment and DME History of  Present Illness:  ASCHER SCHROEPFER is an 79 y.o. male with hx of hypotestosteronemia on topical suplement, ataxia, migraine HA controlled with Topamax, sinus bradycardia, coratid disease s/p CEA, s/p Lap bil hernia repair 01/16/13, presents to the ER wih increase weakness, decrease appetite, and constipation.  He was seen by GI and had disimpaction and had a BM yesterday.  He has been taking his pain pills, up to 3 a day according to his wife, but none the last 2 days.  He denied fever, chills, nausea, vomiting or abdominal pain.  He does have ataxia, and frequent falls, but this is not new.  Family was concerned that he is not moving around enough, and last hospitalization, he became deconditioned requiring STR.  Evalaution in the ER with CXR showed no infiltrate, but slight ileus seen.  His Cr has increase to 2.6 from baseline of 1.3, with BUN elevated to 75, but Na is normal.    Hospitalist was asked to admit him for AKI, due to volume depletion, and constipation with possible narcotic induced ileus s/p recent surgery.       Prior Functioning  Home Living Family/patient expects to be discharged to:: Private residence Living Arrangements: Spouse/significant other Available Help at Discharge: Available PRN/intermittently Type of Home: Independent living facility Home Access: Level entry Home Layout: One level Home Equipment: Walker - 4 wheels;Shower seat;Grab bars - toilet;Grab bars - tub/shower;Hand held shower head;Other (comment) (lift chair) Prior Function Level of Independence: Needs assistance (last few days) Gait / Transfers Assistance Needed: A with bed mobility. Has lift chair.  ADL's / Homemaking Assistance Needed: A with toileting, especially clothing management Comments: Independent prior to surgery on 12/2 Communication Communication: No difficulties    Cognition  Cognition Arousal/Alertness: Awake/alert Behavior During Therapy: WFL for tasks assessed/performed Overall  Cognitive Status: Within Functional Limits for tasks assessed    Extremity/Trunk Assessment Upper Extremity Assessment Upper Extremity Assessment: Overall WFL for tasks assessed Lower Extremity Assessment Lower Extremity Assessment: Generalized weakness Cervical / Trunk Assessment Cervical / Trunk Assessment: Kyphotic   Balance Balance Balance Assessed: Yes Static Sitting Balance Static Sitting - Balance Support: No upper extremity supported;Feet supported Static Sitting - Level of Assistance: 5: Stand by assistance Dynamic Sitting Balance Dynamic Sitting - Level of Assistance: 5: Stand by assistance Dynamic Sitting Balance - Compensations: reaches forward, extends each knee  and does not fall backward.  End of Session PT - End of Session Equipment Utilized During Treatment: Gait belt Activity Tolerance: Patient tolerated treatment well Patient left: in chair;with call bell/phone within reach;with family/visitor present Nurse Communication: Mobility status  GP     Rada Hay 01/27/2013, 12:37 PM Blanchard Kelch PT [phone]

## 2013-01-27 NOTE — Progress Notes (Signed)
Pt with decreased urine output.  Only voiding 50cc at a time and having difficulty as well. PVR determined to be greater than 300 per bladder scan.  Renal ultrasound results available.  Dr. Cena Benton notified of above.  Orders received, will continue to monitor.

## 2013-01-27 NOTE — Progress Notes (Signed)
  Subjective: Had a BM yesterday.  None since.  No n/v.  Objective: Vital signs in last 24 hours: Temp:  [98 F (36.7 C)-98.2 F (36.8 C)] 98 F (36.7 C) (12/13 0528) Pulse Rate:  [67-75] 67 (12/13 0528) Resp:  [16-20] 16 (12/13 0528) BP: (146-178)/(71-77) 146/71 mmHg (12/13 0528) SpO2:  [97 %-100 %] 97 % (12/13 0528) Weight:  [158 lb 14.4 oz (72.077 kg)] 158 lb 14.4 oz (72.077 kg) (12/12 2326) Last BM Date: 01/26/13  Intake/Output from previous day: 12/12 0701 - 12/13 0700 In: 2186.3 [I.V.:686.3; IV Piggyback:1500] Out: 150 [Urine:150] Intake/Output this shift:    PE: General- In NAD Abdomen-soft, active bowel sounds, incisions clean and intact  Lab Results:   Recent Labs  01/26/13 1855  WBC 6.6  HGB 11.4*  HCT 35.2*  PLT 196   BMET  Recent Labs  01/26/13 1900 01/27/13 0500  NA 138 139  K 3.9 4.0  CL 106 109  CO2 20 19  GLUCOSE 112* 148*  BUN 75* 69*  CREATININE 2.63* 2.85*  CALCIUM 8.9 8.0*   PT/INR No results found for this basename: LABPROT, INR,  in the last 72 hours Comprehensive Metabolic Panel:    Component Value Date/Time   NA 139 01/27/2013 0500   K 4.0 01/27/2013 0500   CL 109 01/27/2013 0500   CO2 19 01/27/2013 0500   BUN 69* 01/27/2013 0500   CREATININE 2.85* 01/27/2013 0500   GLUCOSE 148* 01/27/2013 0500   CALCIUM 8.0* 01/27/2013 0500   AST 16 01/26/2013 1900   ALT 14 01/26/2013 1900   ALKPHOS 49 01/26/2013 1900   BILITOT 0.7 01/26/2013 1900   PROT 6.1 01/26/2013 1900   ALBUMIN 2.7* 01/26/2013 1900     Studies/Results: Dg Abd Acute W/chest  01/26/2013   CLINICAL DATA:  Abdominal pain. Weakness. Postop from hernia repair 10 days ago.  EXAM: ACUTE ABDOMEN SERIES (ABDOMEN 2 VIEW & CHEST 1 VIEW)  COMPARISON:  Chest radiograph on 07/30/2012  FINDINGS: Mild gaseous distention of small bowel and colon noted, consistent with mild postop ileus. No radiopaque calculi or other significant radiographic abnormality is seen. Left hip  prosthesis noted.  Heart size and mediastinal contours are within normal limits. Both lungs are clear.  IMPRESSION: Mild postop ileus pattern.  No active cardiopulmonary disease.   Electronically Signed   By: Myles Rosenthal M.D.   On: 01/26/2013 19:54    Anti-infectives: Anti-infectives   None      Assessment Principal Problem:   AKI (acute kidney injury)   Inguinal hernia recurrent bilateral s/p lap repair 01/16/2013  Chronic constipation exacerbated since surgery due to narcotics, surgery itself and decreased mobility     LOS: 1 day   Plan: Mobilize.  Fleets enema.   Alila Sotero J 01/27/2013

## 2013-01-27 NOTE — Progress Notes (Signed)
Occupational Therapy Evaluation Patient Details Name: Jose Leach MRN: [id number] DOB: [birth year] Today's Date: 01/27/2013 Time: 4098-1191 OT Time Calculation (min): 23 min  OT Assessment / Plan / Recommendation History of present illness Jose Leach is an 79 y.o. male with hx of hypotestosteronemia on topical suplement, ataxia, migraine HA controlled with Topamax, sinus bradycardia, coratid disease s/p CEA, s/p Lap bil hernia repair 01/16/13, presents to the ER wih increase weakness, decrease appetite, and constipation.  He was seen by GI and had disimpaction and had a BM yesterday.  He has been taking his pain pills, up to 3 a day according to his wife, but none the last 2 days.  He denied fever, chills, nausea, vomiting or abdominal pain.  He does have ataxia, and frequent falls, but this is not new.  Family was concerned that he is not moving around enough, and last hospitalization, he became deconditioned requiring STR.  Evalaution in the ER with CXR showed no infiltrate, but slight ileus seen.  His Cr has increase to 2.6 from baseline of 1.3, with BUN elevated to 75, but Na is normal.  He has no leukocytosis, and his Hb is 11.4 grams per dL.  Hospitalist was asked to admit him for AKI, due to volume depletion, and constipation with possible narcotic induced ileus s/p recent surgery.   Clinical Impression   Patient ambulated in hallway with RW, performed toileting with BSC (urine only). Difficulty with ADLs and wife states she is unable to physically assist him. Recommend short-term rehab and pt/wife state that there is short term rehab unit at [facility].    OT Assessment  Patient needs continued OT Services    Follow Up Recommendations  SNF    Barriers to Discharge      Equipment Recommendations  Other (comment) (tbd)    Recommendations for Other Services    Frequency  Min 2X/week    Precautions / Restrictions Precautions Precautions:  Fall Restrictions Weight Bearing Restrictions: No   Pertinent Vitals/Pain No c/o pain    ADL  Eating/Feeding: Performed;Set up Where Assessed - Eating/Feeding: Chair Grooming: Performed;Wash/dry hands;Set up Where Assessed - Grooming: Supported sitting Upper Body Dressing: Performed;Minimal assistance Where Assessed - Upper Body Dressing: Unsupported sitting Lower Body Dressing: Simulated;Maximal assistance Where Assessed - Lower Body Dressing: Supported sitting Toilet Transfer: Performed;Minimal assistance Toilet Transfer Method: Sit to stand;Stand pivot Acupuncturist: Materials engineer and Hygiene: Performed;Maximal assistance Where Assessed - Engineer, mining and Hygiene: Sit to stand from 3-in-1 or toilet Transfers/Ambulation Related to ADLs: Min A overall for bed mobility, transfers, ambulation with RW, +2 for safety. RN stopped therapy prior to entry to room and recommended 2 persons for safety. ADL Comments: Able to perform toilet hygiene seated on BSC with setup.    OT Diagnosis: Generalized weakness  OT Problem List: Decreased strength;Decreased activity tolerance;Impaired balance (sitting and/or standing);Decreased safety awareness;Decreased knowledge of use of DME or AE OT Treatment Interventions: Self-care/ADL training;Therapeutic exercise;DME and/or AE instruction;Therapeutic activities;Patient/family education   OT Goals(Current goals can be found in the care plan section) Acute Rehab OT Goals Patient Stated Goal: none stated OT Goal Formulation: With patient Time For Goal Achievement: 02/10/13 Potential to Achieve Goals: Good  Visit Information  Last OT Received On: 01/27/13 Assistance Needed: +1 PT/OT/SLP Co-Evaluation/Treatment: Yes (for safety and equipment) Reason for Co-Treatment: For patient/therapist safety PT goals addressed during session: Mobility/safety with mobility;Balance;Proper use of  DME;Strengthening/ROM OT goals addressed during session: ADL's and self-care;Proper  use of Adaptive equipment and DME History of Present Illness: Jose Leach is an 79 y.o. male with hx of hypotestosteronemia on topical suplement, ataxia, migraine HA controlled with Topamax, sinus bradycardia, coratid disease s/p CEA, s/p Lap bil hernia repair 01/16/13, presents to the ER wih increase weakness, decrease appetite, and constipation.  He was seen by GI and had disimpaction and had a BM yesterday.  He has been taking his pain pills, up to 3 a day according to his wife, but none the last 2 days.  He denied fever, chills, nausea, vomiting or abdominal pain.  He does have ataxia, and frequent falls, but this is not new.  Family was concerned that he is not moving around enough, and last hospitalization, he became deconditioned requiring STR.  Evalaution in the ER with CXR showed no infiltrate, but slight ileus seen.  His Cr has increase to 2.6 from baseline of 1.3, with BUN elevated to 75, but Na is normal.  He has no leukocytosis, and his Hb is 11.4 grams per dL.  Hospitalist was asked to admit him for AKI, due to volume depletion, and constipation with possible narcotic induced ileus s/p recent surgery.       Prior Functioning     Home Living Family/patient expects to be discharged to:: Private residence Living Arrangements: Spouse/significant other Available Help at Discharge: Available PRN/intermittently Type of Home: Independent living facility Home Access: Level entry Home Layout: One level Home Equipment: Walker - 4 wheels;Shower seat;Grab bars - toilet;Grab bars - tub/shower;Hand held shower head;Other (comment) (lift chair) Prior Function Level of Independence: Needs assistance (last few days) Gait / Transfers Assistance Needed: A with bed mobility. Has lift chair.  ADL's / Homemaking Assistance Needed: A with toileting, especially clothing management Comments: Independent prior to  surgery on 12/2 Communication Communication: No difficulties         Vision/Perception Vision - History Baseline Vision: Wears glasses all the time   Cognition  Cognition Arousal/Alertness: Awake/alert Behavior During Therapy: WFL for tasks assessed/performed Overall Cognitive Status: Within Functional Limits for tasks assessed    Extremity/Trunk Assessment Upper Extremity Assessment Upper Extremity Assessment: Overall WFL for tasks assessed     Mobility       Exercise     Balance     End of Session OT - End of Session Equipment Utilized During Treatment: Gait belt;Rolling walker Activity Tolerance: Patient tolerated treatment well Patient left: in chair;with call bell/phone within reach;with family/visitor present Nurse Communication: Mobility status  GO     Emon Miggins A 01/27/2013, 12:31 PM

## 2013-01-28 DIAGNOSIS — K5641 Fecal impaction: Secondary | ICD-10-CM

## 2013-01-28 DIAGNOSIS — R338 Other retention of urine: Secondary | ICD-10-CM

## 2013-01-28 HISTORY — DX: Other retention of urine: R33.8

## 2013-01-28 LAB — BASIC METABOLIC PANEL
BUN: 46 mg/dL — ABNORMAL HIGH (ref 6–23)
Calcium: 7.7 mg/dL — ABNORMAL LOW (ref 8.4–10.5)
Chloride: 117 mEq/L — ABNORMAL HIGH (ref 96–112)
Creatinine, Ser: 1.68 mg/dL — ABNORMAL HIGH (ref 0.50–1.35)
GFR calc Af Amer: 43 mL/min — ABNORMAL LOW (ref >90)
Sodium: 143 mEq/L (ref 135–145)

## 2013-01-28 MED ORDER — POLYETHYLENE GLYCOL 3350 17 G PO PACK
17.0000 g | PACK | Freq: Every day | ORAL | Status: DC
  Administered 2013-01-28: 10:00:00 17 g via ORAL
  Filled 2013-01-28 (×2): qty 1

## 2013-01-28 MED ORDER — ENSURE COMPLETE PO LIQD
237.0000 mL | Freq: Two times a day (BID) | ORAL | Status: DC
  Administered 2013-01-28 – 2013-01-29 (×2): 237 mL via ORAL

## 2013-01-28 MED ORDER — TAMSULOSIN HCL 0.4 MG PO CAPS
0.8000 mg | ORAL_CAPSULE | Freq: Every day | ORAL | Status: DC
  Administered 2013-01-28 – 2013-01-29 (×2): 0.8 mg via ORAL
  Filled 2013-01-28: qty 2

## 2013-01-28 NOTE — Progress Notes (Signed)
Clinical Social Work Department BRIEF PSYCHOSOCIAL ASSESSMENT 01/28/2013  Patient:  Jose Leach, Jose Leach     Account Number:  [id number]     Admit date:  01/26/2013  Clinical Social Worker:  Doroteo Glassman  Date/Time:  01/28/2013 09:22 AM  Referred by:  Physician  Date Referred:  01/28/2013 Referred for  SNF Placement   Other Referral:   Interview type:  Other - See comment Other interview type:   [name] via phone    PSYCHOSOCIAL DATA Living Status:  FACILITY Admitted from facility:  [facility] Level of care:  Independent Living Primary support name:  Mrs. Shen Primary support relationship to patient:  SPOUSE Degree of support available:   strong    CURRENT CONCERNS Current Concerns  Post-Acute Placement   Other Concerns:    SOCIAL WORK ASSESSMENT / PLAN Received a telephone call from the switchboard operator from [name].    Spoke with [name] re: d/c plans.    [name] stated that [place] of [facility] and that, upon d/c, Pt will need to go to the skilled section of [facility].  [name] stated that she has been in contact with the skilled side and that they informed her that an FL2 was needed, as well as an order.    CSW explained the SNF process and offered reassurance that the CSW will handle Pt's placement needs, as [name] was anxious about how to have everything in order for Pt's d/c.    CSW thanked [name] for her time.   Assessment/plan status:  Psychosocial Support/Ongoing Assessment of Needs Other assessment/ plan:   Information/referral to community resources:   n/a    PATIENT'S/FAMILY'S RESPONSE TO PLAN OF CARE: [name] was grateful to CSW for explaining the process and for handling the situation.  She stated, "Oh, bless you!"    [name] happy that Pt will be staying in the [facility] living community.    [name] thanked CSW for time and assistance.   Providence Crosby,  LCSWA Clinical Social Work [phone]

## 2013-01-28 NOTE — Progress Notes (Signed)
INITIAL NUTRITION ASSESSMENT  DOCUMENTATION CODES Per approved criteria  -Not Applicable   INTERVENTION: Ensure Complete po BID, each supplement provides 350 kcal and 13 grams of protein  NUTRITION DIAGNOSIS: Inadequate oral intake related to weakness as evidenced by reported intake less than estimated needs.   Goal: Pt to meet >/= 90% of their estimated nutrition needs   Monitor:  Wt, po intake, acceptance of supplements, labs  Reason for Assessment: MST  79 y.o. male  Admitting Dx: AKI (acute kidney injury)  ASSESSMENT: Jose Leach is an 79 y.o. male with hx of hypotestosteronemia on topical suplement, ataxia, migraine HA controlled with Topamax, sinus bradycardia, coratid disease s/p CEA, s/p Lap bil hernia repair 01/16/13, presents to the ER wih increase weakness, decrease appetite, and constipation. He was seen by GI and had disimpaction and had a BM yesterday.  Pt reports that he drinks ensure at his assisted living facility. He says that his appetite was poor for the past couple of weeks, but that it has returned since being admitted into the hospital. His wife questioned RD about foods to fix for lunch that would be nutritious since assisted living provides breakfast and dinner, but not lunch. RD discussed foods that she could prepare for lunch.   Height: Ht Readings from Last 1 Encounters:  01/26/13 6\' 1"  (1.854 m)    Weight: Wt Readings from Last 1 Encounters:  01/26/13 158 lb 14.4 oz (72.077 kg)    Ideal Body Weight: 79.9 kg  % Ideal Body Weight: 90%  Wt Readings from Last 10 Encounters:  01/26/13 158 lb 14.4 oz (72.077 kg)  01/22/13 159 lb 6.4 oz (72.303 kg)  01/09/13 155 lb 4.8 oz (70.444 kg)  01/05/13 155 lb 8 oz (70.534 kg)  12/14/12 156 lb 6 oz (70.931 kg)  12/12/12 155 lb (70.308 kg)  12/06/12 154 lb (69.854 kg)  10/31/12 155 lb (70.308 kg)  10/23/12 156 lb (70.761 kg)  10/18/12 155 lb (70.308 kg)    Usual Body Weight: 155 lbs  % Usual  Body Weight: 102%  BMI:  Body mass index is 20.97 kg/(m^2).  Estimated Nutritional Needs: Kcal: 1800-2100 Protein: 85-100 g Fluid: 1.8-2.1 L  Skin: stage I pressure ulcer on sacrum  Diet Order: General  EDUCATION NEEDS: -Education needs addressed   Intake/Output Summary (Last 24 hours) at 01/28/13 1140 Last data filed at 01/28/13 0900  Gross per 24 hour  Intake   2560 ml  Output   3200 ml  Net   -640 ml    Last BM: 12/13   Labs:   Recent Labs Lab 01/26/13 1900 01/27/13 0500 01/28/13 0525  NA 138 139 143  K 3.9 4.0 3.8  CL 106 109 117*  CO2 20 19 21   BUN 75* 69* 46*  CREATININE 2.63* 2.85* 1.68*  CALCIUM 8.9 8.0* 7.7*  GLUCOSE 112* 148* 138*    CBG (last 3)  No results found for this basename: GLUCAP,  in the last 72 hours  Scheduled Meds: . antiseptic oral rinse  15 mL Mouth Rinse BID  . aspirin  81 mg Oral Daily  . atorvastatin  20 mg Oral q1800  . B-complex with vitamin C  1 tablet Oral Daily  . buPROPion  300 mg Oral Daily  . cholecalciferol  4,000 Units Oral Daily  . heparin  5,000 Units Subcutaneous Q8H  . latanoprost  1 drop Both Eyes QHS  . levothyroxine  25 mcg Oral Custom  . levothyroxine  50 mcg Oral  Custom  . Linaclotide  290 mcg Oral Daily  . mirtazapine  45 mg Oral QHS  . polyethylene glycol  17 g Oral Daily  . sertraline  200 mg Oral Daily  . sodium chloride  3 mL Intravenous Q12H  . tamsulosin  0.8 mg Oral Daily  . testosterone  5 g Transdermal Daily  . topiramate  25 mg Oral Daily  . topiramate  50 mg Oral QHS  . vitamin B-12  1,000 mcg Oral Daily    Continuous Infusions: . dextrose 5 % and 0.9% NaCl 100 mL/hr at 01/28/13 0445    Past Medical History  Diagnosis Date  . Migraines     Dr [name]  . Peripheral neuropathy   . Benign essential tremor   . Cervical stenosis of spine   . Anemia     B12 deficiency  . Testosterone deficiency     Dr [name], [facility]  . Hypothyroidism   . Hyperlipidemia   . Depression     Dr  [name]  . History of shingles 2009  . H/O cardiovascular stress test     a. 02/2003 -> no ischemia/infarct.  Marland Kitchen Hx of echocardiogram     Echocardiogram 08/01/12: Mild LVH, EF 55-60%, normal wall motion.  . Sinus bradycardia   . PAD (peripheral artery disease)   . Carotid stenosis     s/p L CEA  . Fecal impaction 10/11/2012    Past Surgical History  Procedure Laterality Date  . Appendectomy  1941  . Lumbar laminectomy    . Carotid endarterectomy       L  . Tonsillectomy and adenoidectomy    . Total hip arthroplasty      L  . Cataract extraction Right   . Eye surgery Right 02/2012    retina  . Inguinal hernia repair Bilateral 01/16/2013    Procedure: LAPAROSCOPIC BILATERAL INGUINAL DIRECT AND INDIRECT, FEMORAL, AND OBTURATOR HERNIAS;  Surgeon: Ardeth Sportsman, MD;  Location: [facility] OR;  Service: General;  Laterality: Bilateral;  . Insertion of mesh N/A 01/16/2013    Procedure: INSERTION OF MESH;  Surgeon: Ardeth Sportsman, MD;  Location: [facility] OR;  Service: General;  Laterality: N/A;  . Breast cyst excision Left 01/16/2013    Procedure: MASS EXCISION AXILLA;  Surgeon: Ardeth Sportsman, MD;  Location: [facility] OR;  Service: General;  Laterality: Left;    Ebbie Latus RD, LDN

## 2013-01-28 NOTE — Progress Notes (Signed)
Subjective: Found to have acute urinary retention.  Foley placed.  Had 2 BMs after enema.  Tolerating diet.  Objective: Vital signs in last 24 hours: Temp:  [97.2 F (36.2 C)-99.1 F (37.3 C)] 99.1 F (37.3 C) (12/14 0511) Pulse Rate:  [65-71] 65 (12/14 0511) Resp:  [16-20] 20 (12/14 0511) BP: (140-159)/(63-70) 159/63 mmHg (12/14 0511) SpO2:  [97 %-100 %] 97 % (12/14 0511) Last BM Date: 01/27/13  Intake/Output from previous day: 12/13 0701 - 12/14 0700 In: 2920 [P.O.:1320; I.V.:1600] Out: 3201 [Urine:3200; Stool:1] Intake/Output this shift:    PE: General- In NAD Abdomen-soft, incisions are clean and intact  Lab Results:   Recent Labs  01/26/13 1855  WBC 6.6  HGB 11.4*  HCT 35.2*  PLT 196   BMET  Recent Labs  01/27/13 0500 01/28/13 0525  NA 139 143  K 4.0 3.8  CL 109 117*  CO2 19 21  GLUCOSE 148* 138*  BUN 69* 46*  CREATININE 2.85* 1.68*  CALCIUM 8.0* 7.7*   PT/INR No results found for this basename: LABPROT, INR,  in the last 72 hours Comprehensive Metabolic Panel:    Component Value Date/Time   NA 143 01/28/2013 0525   K 3.8 01/28/2013 0525   CL 117* 01/28/2013 0525   CO2 21 01/28/2013 0525   BUN 46* 01/28/2013 0525   CREATININE 1.68* 01/28/2013 0525   GLUCOSE 138* 01/28/2013 0525   CALCIUM 7.7* 01/28/2013 0525   AST 16 01/26/2013 1900   ALT 14 01/26/2013 1900   ALKPHOS 49 01/26/2013 1900   BILITOT 0.7 01/26/2013 1900   PROT 6.1 01/26/2013 1900   ALBUMIN 2.7* 01/26/2013 1900     Studies/Results: US Renal  01/27/2013   CLINICAL DATA:  Elevated creatinine  EXAM: RENAL/URINARY TRACT ULTRASOUND COMPLETE  COMPARISON:  None.  FINDINGS: Right Kidney:  Length: 10.5 cm. 7 mm lower pole cyst. Mild to moderate hydronephrosis.  Left Kidney:  Length: 13.1 cm. 7.6 x 6.7 x 7.0 cm mildly complex upper pole renal cyst with layering peripheral debris. Mild to moderate hydronephrosis.  Bladder:  Distended bladder with large postvoid residual.   IMPRESSION: Distended bladder with large postvoid residual.  Mild to moderate bilateral hydronephrosis, possibly related to bladder outlet obstruction.  7.6 cm mildly complex left upper pole renal cyst with layering debris.   Electronically Signed   By: Charline Bills M.D.   On: 01/27/2013 15:45   Dg Abd Acute W/chest  01/26/2013   CLINICAL DATA:  Abdominal pain. Weakness. Postop from hernia repair 10 days ago.  EXAM: ACUTE ABDOMEN SERIES (ABDOMEN 2 VIEW & CHEST 1 VIEW)  COMPARISON:  Chest radiograph on 07/30/2012  FINDINGS: Mild gaseous distention of small bowel and colon noted, consistent with mild postop ileus. No radiopaque calculi or other significant radiographic abnormality is seen. Left hip prosthesis noted.  Heart size and mediastinal contours are within normal limits. Both lungs are clear.  IMPRESSION: Mild postop ileus pattern.  No active cardiopulmonary disease.   Electronically Signed   By: Myles Rosenthal M.D.   On: 01/26/2013 19:54    Anti-infectives: Anti-infectives   None      Assessment Principal Problem:   AKI (acute kidney injury)-secondary to post obstructive process-renal function improved after foley placed.   Inguinal hernia recurrent bilateral s/p lap repair 01/16/2013  Chronic constipation exacerbated since surgery due to narcotics, surgery itself and decreased mobility-Had 2 BMs yesterday.     LOS: 2 days   Plan:  Miralax daily-scheduled.  Will inform Dr.  Gross that he is here.   Jose Leach J 01/28/2013

## 2013-01-28 NOTE — Progress Notes (Signed)
[facility] PROGRESS NOTE  Jose Leach YNW:[id number] DOB: [birth year] DOA: 01/26/2013 PCP: Jose Melnick, MD  Assessment/Plan: 1. Acute kidney injury - Given renal ultrasound findings and improvement of serum creatinine after Foley placement, acute kidney injury secondary to postobstructive cause. - Continue with Foley catheter  2. urinary retention - Case discussed with urologist. Plan will be to discharge with Foley catheter in place and I will increase Flomax dose. - Patient will need to be seen by urologist within one week after discharge  3. Difficulty walking - Physical therapy consulted and recommending skilled nursing facility.  4. Fecal impaction - Resolved patient has had multiple bowel movements after enema  5. Hypothyroidism - TSH within normal limits - Continue current Synthroid dose  6. Weakness - Most likely recently 2 to poor oral intake as mentioned above awaiting physical therapy evaluation  7. history of inguinal hernia recurrent with bilateral status post lap repair - General surgery on board and would like to thank general surgery for their evaluation  Code Status: full Family Communication: No family at bedside. Discussed with patient. Disposition Plan: Discharge to skilled nursing facility with a Foley catheter in place most likely next a.m. 01/29/2013   Consultants:  General surgeon  Procedures:  None  Antibiotics:  None  HPI/Subjective: No new complaints. Patient reported no new problems overnight.   Objective: Filed Vitals:   01/28/13 0511  BP: 159/63  Pulse: 65  Temp: 99.1 F (37.3 C)  Resp: 20    Intake/Output Summary (Last 24 hours) at 01/28/13 1136 Last data filed at 01/28/13 0900  Gross per 24 hour  Intake   2560 ml  Output   3200 ml  Net   -640 ml   Filed Weights   01/26/13 2300 01/26/13 2326  Weight: 72.077 kg (158 lb 14.4 oz) 72.077 kg (158 lb 14.4 oz)    Exam:   General:  Patient in no acute  distress, alert and awake  Cardiovascular: Regular rate and rhythm, no murmurs rubs or gallops  Respiratory: Clear to auscultation bilaterally, no wheezes  Abdomen: Soft, nondistended  GU: Foley catheter in place  Musculoskeletal: No cyanosis or clubbing   Data Reviewed: Basic Metabolic Panel:  Recent Labs Lab 01/26/13 1900 01/27/13 0500 01/28/13 0525  NA 138 139 143  K 3.9 4.0 3.8  CL 106 109 117*  CO2 20 19 21   GLUCOSE 112* 148* 138*  BUN 75* 69* 46*  CREATININE 2.63* 2.85* 1.68*  CALCIUM 8.9 8.0* 7.7*   Liver Function Tests:  Recent Labs Lab 01/26/13 1900  AST 16  ALT 14  ALKPHOS 49  BILITOT 0.7  PROT 6.1  ALBUMIN 2.7*    Recent Labs Lab 01/26/13 1900  LIPASE 15   No results found for this basename: AMMONIA,  in the last 168 hours CBC:  Recent Labs Lab 01/26/13 1855  WBC 6.6  NEUTROABS 4.9  HGB 11.4*  HCT 35.2*  MCV 91.2  PLT 196   Cardiac Enzymes:  Recent Labs Lab 01/26/13 1920  TROPONINI <0.30   BNP (last 3 results)  Recent Labs  07/31/12 0202  PROBNP 394.0   CBG: No results found for this basename: GLUCAP,  in the last 168 hours  No results found for this or any previous visit (from the past 240 hour(s)).   Studies: US Renal  01/27/2013   CLINICAL DATA:  Elevated creatinine  EXAM: RENAL/URINARY TRACT ULTRASOUND COMPLETE  COMPARISON:  None.  FINDINGS: Right Kidney:  Length: 10.5 cm. 7 mm  lower pole cyst. Mild to moderate hydronephrosis.  Left Kidney:  Length: 13.1 cm. 7.6 x 6.7 x 7.0 cm mildly complex upper pole renal cyst with layering peripheral debris. Mild to moderate hydronephrosis.  Bladder:  Distended bladder with large postvoid residual.  IMPRESSION: Distended bladder with large postvoid residual.  Mild to moderate bilateral hydronephrosis, possibly related to bladder outlet obstruction.  7.6 cm mildly complex left upper pole renal cyst with layering debris.   Electronically Signed   By: Charline Bills M.D.   On:  01/27/2013 15:45   Dg Abd Acute W/chest  01/26/2013   CLINICAL DATA:  Abdominal pain. Weakness. Postop from hernia repair 10 days ago.  EXAM: ACUTE ABDOMEN SERIES (ABDOMEN 2 VIEW & CHEST 1 VIEW)  COMPARISON:  Chest radiograph on 07/30/2012  FINDINGS: Mild gaseous distention of small bowel and colon noted, consistent with mild postop ileus. No radiopaque calculi or other significant radiographic abnormality is seen. Left hip prosthesis noted.  Heart size and mediastinal contours are within normal limits. Both lungs are clear.  IMPRESSION: Mild postop ileus pattern.  No active cardiopulmonary disease.   Electronically Signed   By: Myles Rosenthal M.D.   On: 01/26/2013 19:54    Scheduled Meds: . antiseptic oral rinse  15 mL Mouth Rinse BID  . aspirin  81 mg Oral Daily  . atorvastatin  20 mg Oral q1800  . B-complex with vitamin C  1 tablet Oral Daily  . buPROPion  300 mg Oral Daily  . cholecalciferol  4,000 Units Oral Daily  . heparin  5,000 Units Subcutaneous Q8H  . latanoprost  1 drop Both Eyes QHS  . levothyroxine  25 mcg Oral Custom  . levothyroxine  50 mcg Oral Custom  . Linaclotide  290 mcg Oral Daily  . mirtazapine  45 mg Oral QHS  . polyethylene glycol  17 g Oral Daily  . sertraline  200 mg Oral Daily  . sodium chloride  3 mL Intravenous Q12H  . tamsulosin  0.8 mg Oral Daily  . testosterone  5 g Transdermal Daily  . topiramate  25 mg Oral Daily  . topiramate  50 mg Oral QHS  . vitamin B-12  1,000 mcg Oral Daily   Continuous Infusions: . dextrose 5 % and 0.9% NaCl 100 mL/hr at 01/28/13 0445    Principal Problem:   AKI (acute kidney injury) Active Problems:   HYPOTHYROIDISM   HYPERLIPIDEMIA   MIGRAINE HEADACHE   Difficulty in walking(719.7)   Weakness   HTN (hypertension)   Inguinal hernia recurrent bilateral s/p lap repair 01/16/2013   Fecal impaction   Volume depletion    Time spent: > 35 minutes    Jose Leach  [facility] Pager [phone]. If 7PM-7AM,  please contact night-coverage at [email or web address], password [facility] 01/28/2013, 11:36 AM  LOS: 2 days

## 2013-01-29 MED ORDER — TAMSULOSIN HCL 0.4 MG PO CAPS: 0.8000 mg | ORAL_CAPSULE | Freq: Every day | ORAL | Status: DC

## 2013-01-29 MED ORDER — SODIUM CHLORIDE 0.9 % IJ SOLN: 3.0000 mL | Freq: Two times a day (BID) | INTRAMUSCULAR | Status: DC

## 2013-01-29 MED ORDER — LIP MEDEX EX OINT
1.0000 "application " | TOPICAL_OINTMENT | Freq: Two times a day (BID) | CUTANEOUS | Status: DC
  Administered 2013-01-29: 1 via TOPICAL
  Filled 2013-01-29: qty 7

## 2013-01-29 MED ORDER — HYDRALAZINE HCL 20 MG/ML IJ SOLN
10.0000 mg | Freq: Four times a day (QID) | INTRAMUSCULAR | Status: DC | PRN
  Administered 2013-01-29: 01:00:00 10 mg via INTRAVENOUS
  Filled 2013-01-29: qty 1

## 2013-01-29 MED ORDER — ALUM & MAG HYDROXIDE-SIMETH 200-200-20 MG/5ML PO SUSP: 30.0000 mL | Freq: Four times a day (QID) | ORAL | Status: DC | PRN

## 2013-01-29 MED ORDER — ACETAMINOPHEN 650 MG RE SUPP: 650.0000 mg | Freq: Four times a day (QID) | RECTAL | Status: DC | PRN

## 2013-01-29 MED ORDER — LACTATED RINGERS IV BOLUS (SEPSIS): 1000.0000 mL | Freq: Three times a day (TID) | INTRAVENOUS | Status: DC | PRN

## 2013-01-29 MED ORDER — DIPHENHYDRAMINE HCL 50 MG/ML IJ SOLN: 12.5000 mg | Freq: Four times a day (QID) | INTRAMUSCULAR | Status: DC | PRN

## 2013-01-29 MED ORDER — MAGIC MOUTHWASH
15.0000 mL | Freq: Four times a day (QID) | ORAL | Status: DC | PRN
  Filled 2013-01-29: qty 15

## 2013-01-29 MED ORDER — OXYCODONE HCL 5 MG PO TABS: 5.0000 mg | ORAL_TABLET | Freq: Four times a day (QID) | ORAL | Status: DC | PRN

## 2013-01-29 MED ORDER — ACETAMINOPHEN 325 MG PO TABS: 325.0000 mg | ORAL_TABLET | Freq: Four times a day (QID) | ORAL | Status: DC | PRN

## 2013-01-29 MED ORDER — SODIUM CHLORIDE 0.9 % IJ SOLN: 3.0000 mL | INTRAMUSCULAR | Status: DC | PRN

## 2013-01-29 MED ORDER — CLONIDINE HCL 0.1 MG PO TABS
0.1000 mg | ORAL_TABLET | Freq: Once | ORAL | Status: AC
  Administered 2013-01-29: 0.1 mg via ORAL
  Filled 2013-01-29: qty 1

## 2013-01-29 MED ORDER — BISACODYL 10 MG RE SUPP: 10.0000 mg | Freq: Two times a day (BID) | RECTAL | Status: DC | PRN

## 2013-01-29 MED ORDER — POLYETHYLENE GLYCOL 3350 17 G PO PACK
17.0000 g | PACK | Freq: Two times a day (BID) | ORAL | Status: DC
  Administered 2013-01-29: 10:00:00 17 g via ORAL
  Filled 2013-01-29 (×2): qty 1

## 2013-01-29 NOTE — Progress Notes (Signed)
Pt with HTN values in the 180-190/70s tonight. Pt is asymptomatic. NP on call notified and orders received and implemented. Will continue to monitor Jose Leach Vitals:   01/28/13 1413 01/28/13 2359 01/29/13 0235 01/29/13 0539  BP: 138/71 180/77 190/74 132/65  Pulse: 59 58 61 55  Temp: 98.2 F (36.8 C) 98.7 F (37.1 C)  98.6 F (37 C)  TempSrc: Oral Oral  Oral  Resp: 20 16  16   Height:      Weight:      SpO2: 100% 99%  97%

## 2013-01-29 NOTE — Care Management Note (Signed)
    Page 1 of 1   01/29/2013     12:08:58 PM   CARE MANAGEMENT NOTE 01/29/2013  Patient:  Jose Leach, Jose Leach   Account Number:  [id number]  Date Initiated:  01/27/2013  Documentation initiated by:  Lanier Clam  Subjective/Objective Assessment:   79 y/o m admitted w/weakness,poor po intake.     Action/Plan:   From [facility]-indep liv.   Anticipated DC Date:  01/30/2013   Anticipated DC Plan:  SKILLED NURSING FACILITY      DC Planning Services  CM consult      Choice offered to / List presented to:             Status of service:  In process, will continue to follow Medicare Important Message given?   (If response is "NO", the following Medicare IM given date fields will be blank) Date Medicare IM given:   Date Additional Medicare IM given:    Discharge Disposition:    Per UR Regulation:  Reviewed for med. necessity/level of care/duration of stay  If discussed at Long Length of Stay Meetings, dates discussed:    Comments:  01/29/13 Tashawnda Bleiler RN,BSN NCM [phone] PT-SNF.

## 2013-01-29 NOTE — Progress Notes (Signed)
[name] [id number] 08/14/1932  CARE TEAM:  PCP: [name], MD  Outpatient Care Team: Patient Care Team: Pecola Lawless, MD as PCP - General (Internal Medicine)  Inpatient Treatment Team: Treatment Team: Attending Provider: [name], MD; Rounding Team: Delmer Islam, MD; Consulting Physician: Ardeth Sportsman, MD; Registered Nurse: Belinda Fisher, RN; Technician: Peterson Ao, NT; Physical Therapist: Rada Hay, PT   Subjective:  Events noted BMs with Miralax Slept poorly last night  Objective:  Vital signs:  Filed Vitals:   01/28/13 1413 01/28/13 2359 01/29/13 0235 01/29/13 0539  BP: 138/71 180/77 190/74 132/65  Pulse: 59 58 61 55  Temp: 98.2 F (36.8 C) 98.7 F (37.1 C)  98.6 F (37 C)  TempSrc: Oral Oral  Oral  Resp: 20 16  16   Height:      Weight:      SpO2: 100% 99%  97%    Last BM Date: 01/28/13  Intake/Output   Yesterday:  12/14 0701 - 12/15 0700 In: 3106.7 [P.O.:720; I.V.:2386.7] Out: 2465 [Urine:2465] This shift:     Bowel function:  Flatus: y  BM: y  Drain: n/a  Physical Exam:  General: Pt awake/alert/oriented x4 in no acute distress Eyes: PERRL, normal EOM.  Sclera clear.  No icterus Neuro: CN II-XII intact w/o focal sensory/motor deficits. Lymph: No head/neck/groin lymphadenopathy Psych:  No delerium/psychosis/paranoia HENT: Normocephalic, Mucus membranes moist.  No thrush Neck: Supple, No tracheal deviation Chest: No chest wall pain w good excursion CV:  Pulses intact.  Regular rhythm MS: Normal AROM mjr joints.  No obvious deformity Abdomen: Soft.  Nondistended.  Mildly tender at incisions only.  No evidence of peritonitis.  No incarcerated hernias. Ext:  SCDs BLE.  No mjr edema.  No cyanosis Skin: No petechiae / purpura.  Ecchymosis at B hips - resolving   Problem List:   Principal Problem:   AKI (acute kidney injury) Active Problems:   HYPOTHYROIDISM   HYPERLIPIDEMIA   MIGRAINE  HEADACHE   Difficulty in walking(719.7)   Weakness   HTN (hypertension)   Inguinal hernia recurrent bilateral s/p lap repair 01/16/2013   Fecal impaction   Volume depletion   Acute urinary retention   Assessment  Jose Leach  79 y.o. male       ARF from urinary retention  Chronic constipation - improved  Deconditioned  Plan:  -wean IVF -Miralax/Lizness bowel regimen -Urology eval as outpt given urinary issues.  Flomax increased.   Leave Foley in at least 3 days = remove 12/17 AM  -VTE prophylaxis- SCDs, etc -control HTN -mobilize as tolerated to help recovery  -placement - SNF w PT/OT  Ardeth Sportsman, M.D., F.A.C.S. Gastrointestinal and Minimally Invasive Surgery [facility] [address] [place], [place] [address] [phone] Main / Paging   01/29/2013   Results:   Labs: Results for orders placed during the hospital encounter of 01/26/13 (from the past 48 hour(s))  SODIUM, URINE, RANDOM     Status: None   Collection Time    01/27/13  1:41 PM      Result Value Range   Sodium, Ur 27     Comment: Performed at [facility]  CREATININE, URINE, RANDOM     Status: None   Collection Time    01/27/13  1:41 PM      Result Value Range   Creatinine, Urine 125.8     Comment: Performed at [facility]  BASIC METABOLIC PANEL  Status: Abnormal   Collection Time    01/28/13  5:25 AM      Result Value Range   Sodium 143  135 - 145 mEq/L   Potassium 3.8  3.5 - 5.1 mEq/L   Chloride 117 (*) 96 - 112 mEq/L   CO2 21  19 - 32 mEq/L   Glucose, Bld 138 (*) 70 - 99 mg/dL   BUN 46 (*) 6 - 23 mg/dL   Creatinine, Ser 1.61 (*) 0.50 - 1.35 mg/dL   Comment: DELTA CHECK NOTED     REPEATED TO VERIFY   Calcium 7.7 (*) 8.4 - 10.5 mg/dL   GFR calc non Af Amer 37 (*) >90 mL/min   GFR calc Af Amer 43 (*) >90 mL/min   Comment: (NOTE)     The eGFR has been calculated using the CKD EPI equation.     This calculation has  not been validated in all clinical situations.     eGFR's persistently <90 mL/min signify possible Chronic Kidney     Disease.    Imaging / Studies: US Renal  01/27/2013   CLINICAL DATA:  Elevated creatinine  EXAM: RENAL/URINARY TRACT ULTRASOUND COMPLETE  COMPARISON:  None.  FINDINGS: Right Kidney:  Length: 10.5 cm. 7 mm lower pole cyst. Mild to moderate hydronephrosis.  Left Kidney:  Length: 13.1 cm. 7.6 x 6.7 x 7.0 cm mildly complex upper pole renal cyst with layering peripheral debris. Mild to moderate hydronephrosis.  Bladder:  Distended bladder with large postvoid residual.  IMPRESSION: Distended bladder with large postvoid residual.  Mild to moderate bilateral hydronephrosis, possibly related to bladder outlet obstruction.  7.6 cm mildly complex left upper pole renal cyst with layering debris.   Electronically Signed   By: Charline Bills M.D.   On: 01/27/2013 15:45    Medications / Allergies: per chart  Antibiotics: Anti-infectives   None

## 2013-01-29 NOTE — Progress Notes (Signed)
Patient cleared for discharge. Packet copied and placed in [place]. ptar called for transportation. Patient and spouse at bedside are aware of transfer and agreeable. Wife is anxious that patient go prior to dark as she does not drive after dark.  [name] MSW, LCSW [phone]

## 2013-01-29 NOTE — Clinical Social Work Placement (Signed)
     Clinical Social Work Department CLINICAL SOCIAL WORK PLACEMENT NOTE 01/29/2013  Patient:  JENNER, ROSIER  Account Number:  [id number] Admit date:  01/26/2013  Clinical Social Worker:  Becky Sax, LCSW  Date/time:  01/29/2013 12:00 M  Clinical Social Work is seeking post-discharge placement for this patient at the following level of care:   SKILLED NURSING   (*CSW will update this form in Epic as items are completed)   01/29/2013  Patient/family provided with [facility] Department of Clinical Social Works list of facilities offering this level of care within the geographic area requested by the patient (or if unable, by the patients family).  01/29/2013  Patient/family informed of their freedom to choose among providers that offer the needed level of care, that participate in Medicare, Medicaid or managed care program needed by the patient, have an available bed and are willing to accept the patient.  01/29/2013  Patient/family informed of [facility] ownership interest in [facility], as well as of the fact that they are under no obligation to receive care at this facility.  PASARR submitted to EDS on 01/29/2013 PASARR number received from EDS on 01/29/2013  FL2 transmitted to all facilities in geographic area requested by pt/family on  01/29/2013 FL2 transmitted to all facilities within larger geographic area on   Patient informed that his/her managed care company has contracts with or will negotiate with  certain facilities, including the following:     Patient/family informed of bed offers received:  01/29/2013 Patient chooses bed at [facility] Physician recommends and patient chooses bed at    Patient to be transferred to [facility] on  01/29/2013 Patient to be transferred to facility by ptar  The following physician request were entered in Epic:   Additional Comments:

## 2013-01-29 NOTE — Progress Notes (Signed)
Report called to [facility]. Maeola Harman

## 2013-01-29 NOTE — Discharge Summary (Signed)
Physician Discharge Summary  Jose Leach AOZ:[id number] DOB: [birth year] DOA: 01/26/2013  PCP: [name], MD  Admit date: 01/26/2013 Discharge date: 01/29/2013  Time spent: > 35 minutes  Recommendations for Outpatient Follow-up:  1. Recommend reassessing serum creatinine on post hospital followup 2. Patient will need to followup with urologist in the next 3-7 days 3. Also of note patient on ultrasound 7.6 cm mildly complex left upper pole renal cyst and patient follows up with urologist would have then recommend further evaluation recommendations from their standpoint  Discharge Diagnoses:  Principal Problem:   AKI (acute kidney injury) Active Problems:   HYPOTHYROIDISM   HYPERLIPIDEMIA   MIGRAINE HEADACHE   Difficulty in walking(719.7)   Weakness   HTN (hypertension)   Inguinal hernia recurrent bilateral s/p lap repair 01/16/2013   Fecal impaction   Volume depletion   Acute urinary retention   Discharge Condition: Stable  Diet recommendation: Low sodium heart healthy  Filed Weights   01/26/13 2300 01/26/13 2326  Weight: 72.077 kg (158 lb 14.4 oz) 72.077 kg (158 lb 14.4 oz)    History of present illness/Hospital Course:   Patient is a 79 year old Caucasian male with history of ventral hernia repair 2 weeks prior to admission who presented with elevated serum creatinine and weakness  1. Acute kidney injury - Given renal ultrasound findings and improvement of serum creatinine after Foley placement, acute kidney injury secondary to postobstructive cause.  - Continue with Foley catheter with plans to have patient followup with urologist as indicated above and below  2. urinary retention  - Case discussed with urologist. Plan will be to discharge with Foley catheter in place and increased Flomax dose - Follow up recommendations listed above  3. Difficulty walking  - Discharge to skilled nursing facility with physical therapy as recommended by her physical  therapist while in house   4. Fecal impaction  - Resolved patient has had multiple bowel movements after enema   5. Hypothyroidism  - TSH within normal limits  - Continue current Synthroid dose   6. Weakness  - Most likely recently 2 to poor oral intake as mentioned above, PT at SNF  7. history of inguinal hernia recurrent with bilateral status post lap repair  - General surgery consulted while the patient in house   Procedures:  None  Consultations:  Discussed with urologist  General surgery last seen by Dr. Michaell Cowing  Discharge Exam: Filed Vitals:   01/29/13 0539  BP: 132/65  Pulse: 55  Temp: 98.6 F (37 C)  Resp: 16    General: Pt in NAD, alert and Awake Cardiovascular: RRR, no MRG Respiratory: CTA BL, no wheezes  Discharge Instructions  Discharge Orders   Future Appointments Provider Department Dept Phone   02/14/2013 10:00 AM Ardeth Sportsman, MD [facility] [place] [phone]   04/18/2013 2:30 PM York Spaniel, MD [facility] [phone]   Future Orders Complete By Expires   Call MD for:  redness, tenderness, or signs of infection (pain, swelling, redness, odor or green/yellow discharge around incision site)  As directed    Call MD for:  temperature >100.4  As directed    Diet - low sodium heart healthy  As directed    Discharge instructions  As directed    Comments:     Please be sure to follow up with your urologist within the next 3-7 days.   Increase activity slowly  As directed        Medication List  aspirin 81 MG tablet  Take 81 mg by mouth daily.     atorvastatin 20 MG tablet  Commonly known as:  LIPITOR  Take 20 mg by mouth daily.     b complex vitamins capsule  Take 1 capsule by mouth daily.     bimatoprost 0.01 % Soln  Commonly known as:  LUMIGAN  Place 1 drop into both eyes at bedtime.     clonazePAM 0.5 MG tablet  Commonly known as:  KLONOPIN  Take 0.5 mg by mouth 3 (three) times daily as  needed for anxiety.     FLAX SEEDS PO  Take 1 tablet by mouth daily.     ICAPS PO  Take 1 capsule by mouth 2 (two) times daily.     Krill Oil 500 MG Caps  Take 500 mg by mouth daily.     levothyroxine 50 MCG tablet  Commonly known as:  SYNTHROID, LEVOTHROID  Take 25-50 mcg by mouth See admin instructions. Take 1/2 tab ( ) on Sundays, 1 tab ( ) on all other days.     Linaclotide 290 MCG Caps capsule  Commonly known as:  LINZESS  Take 1 capsule (290 mcg total) by mouth daily.     mirtazapine 45 MG tablet  Commonly known as:  REMERON  Take 45 mg by mouth at bedtime.     mupirocin ointment 2 %  Commonly known as:  BACTROBAN  Applied twice a day to the affected area;NOT into eyes.     oxyCODONE 5 MG immediate release tablet  Commonly known as:  Oxy IR/ROXICODONE  Take 1-2 tablets (5-10 mg total) by mouth every 6 (six) hours as needed for moderate pain or severe pain.     polyethylene glycol packet  Commonly known as:  MIRALAX / GLYCOLAX  Take 17 g by mouth daily as needed for mild constipation. 3 capfuls daily     sertraline 100 MG tablet  Commonly known as:  ZOLOFT  Take 200 mg by mouth daily.     tamsulosin 0.4 MG Caps capsule  Commonly known as:  FLOMAX  Take 0.4 mg by mouth daily.     [name] 50 MG/5GM Gel  Generic drug:  testosterone  Place 5 g onto the skin daily.     topiramate 25 MG tablet  Commonly known as:  TOPAMAX  Take 25-50 mg by mouth 2 (two) times daily. 1 tablet (25mg ) in the morning and 2 tablets (50mg ) in the evening.     vitamin B-12 1000 MCG tablet  Commonly known as:  CYANOCOBALAMIN  Take 1,000 mcg by mouth daily.     Vitamin D3 2000 UNITS Tabs  Take 4,000 Units by mouth daily.     WELLBUTRIN XL 300 MG 24 hr tablet  Generic drug:  buPROPion  Take 300 mg by mouth daily.       Allergies  Allergen Reactions  . Latex     REACTION: rash from adhesive  . Tape Rash    Paper tape is ok to use       The results of significant  diagnostics from this hospitalization (including imaging, microbiology, ancillary and laboratory) are listed below for reference.    Significant Diagnostic Studies: US Renal  01/27/2013   CLINICAL DATA:  Elevated creatinine  EXAM: RENAL/URINARY TRACT ULTRASOUND COMPLETE  COMPARISON:  None.  FINDINGS: Right Kidney:  Length: 10.5 cm. 7 mm lower pole cyst. Mild to moderate hydronephrosis.  Left Kidney:  Length: 13.1 cm. 7.6 x 6.7 x 7.0  cm mildly complex upper pole renal cyst with layering peripheral debris. Mild to moderate hydronephrosis.  Bladder:  Distended bladder with large postvoid residual.  IMPRESSION: Distended bladder with large postvoid residual.  Mild to moderate bilateral hydronephrosis, possibly related to bladder outlet obstruction.  7.6 cm mildly complex left upper pole renal cyst with layering debris.   Electronically Signed   By: Charline Bills M.D.   On: 01/27/2013 15:45   Dg Abd Acute W/chest  01/26/2013   CLINICAL DATA:  Abdominal pain. Weakness. Postop from hernia repair 10 days ago.  EXAM: ACUTE ABDOMEN SERIES (ABDOMEN 2 VIEW & CHEST 1 VIEW)  COMPARISON:  Chest radiograph on 07/30/2012  FINDINGS: Mild gaseous distention of small bowel and colon noted, consistent with mild postop ileus. No radiopaque calculi or other significant radiographic abnormality is seen. Left hip prosthesis noted.  Heart size and mediastinal contours are within normal limits. Both lungs are clear.  IMPRESSION: Mild postop ileus pattern.  No active cardiopulmonary disease.   Electronically Signed   By: Myles Rosenthal M.D.   On: 01/26/2013 19:54    Microbiology: No results found for this or any previous visit (from the past 240 hour(s)).   Labs: Basic Metabolic Panel:  Recent Labs Lab 01/26/13 1900 01/27/13 0500 01/28/13 0525  NA 138 139 143  K 3.9 4.0 3.8  CL 106 109 117*  CO2 20 19 21   GLUCOSE 112* 148* 138*  BUN 75* 69* 46*  CREATININE 2.63* 2.85* 1.68*  CALCIUM 8.9 8.0* 7.7*   Liver  Function Tests:  Recent Labs Lab 01/26/13 1900  AST 16  ALT 14  ALKPHOS 49  BILITOT 0.7  PROT 6.1  ALBUMIN 2.7*    Recent Labs Lab 01/26/13 1900  LIPASE 15   No results found for this basename: AMMONIA,  in the last 168 hours CBC:  Recent Labs Lab 01/26/13 1855  WBC 6.6  NEUTROABS 4.9  HGB 11.4*  HCT 35.2*  MCV 91.2  PLT 196   Cardiac Enzymes:  Recent Labs Lab 01/26/13 1920  TROPONINI <0.30   BNP: BNP (last 3 results)  Recent Labs  07/31/12 0202  PROBNP 394.0   CBG: No results found for this basename: GLUCAP,  in the last 168 hours     Signed:  [name]  [facility] 01/29/2013, 12:33 PM

## 2013-01-30 ENCOUNTER — Encounter: Payer: Self-pay | Admitting: Nurse Practitioner

## 2013-01-30 ENCOUNTER — Non-Acute Institutional Stay (SKILLED_NURSING_FACILITY): Payer: Medicare Other | Admitting: Nurse Practitioner

## 2013-01-30 DIAGNOSIS — R338 Other retention of urine: Secondary | ICD-10-CM

## 2013-01-30 DIAGNOSIS — N179 Acute kidney failure, unspecified: Secondary | ICD-10-CM

## 2013-01-30 DIAGNOSIS — N281 Cyst of kidney, acquired: Secondary | ICD-10-CM | POA: Insufficient documentation

## 2013-01-30 DIAGNOSIS — E039 Hypothyroidism, unspecified: Secondary | ICD-10-CM

## 2013-01-30 DIAGNOSIS — F411 Generalized anxiety disorder: Secondary | ICD-10-CM

## 2013-01-30 DIAGNOSIS — I10 Essential (primary) hypertension: Secondary | ICD-10-CM

## 2013-01-30 DIAGNOSIS — Q619 Cystic kidney disease, unspecified: Secondary | ICD-10-CM

## 2013-01-30 DIAGNOSIS — G43909 Migraine, unspecified, not intractable, without status migrainosus: Secondary | ICD-10-CM

## 2013-01-30 DIAGNOSIS — K5900 Constipation, unspecified: Secondary | ICD-10-CM

## 2013-01-30 DIAGNOSIS — K5909 Other constipation: Secondary | ICD-10-CM

## 2013-01-30 NOTE — Assessment & Plan Note (Signed)
Controlled with Topmax.   

## 2013-01-30 NOTE — Assessment & Plan Note (Signed)
Takes Levothyroxine 50mcg daily except Sunday 25mcg. Last TSH wnl while in hospital.    

## 2013-01-30 NOTE — Assessment & Plan Note (Signed)
Apparently stable on Zoloft 200mg, Wellbutrin 300mg, and Remeron 45mg 

## 2013-01-30 NOTE — Assessment & Plan Note (Signed)
Controlled, takes

## 2013-01-30 NOTE — Assessment & Plan Note (Signed)
Foley inserted, Flomax increased, f/u Urology as out patient scheduled.    

## 2013-01-30 NOTE — Progress Notes (Signed)
2Patient ID: Jose Leach, male   DOB: 1933/02/06, 79 y.o.   MRN: [id number]   Code Status: DNR  Allergies  Allergen Reactions  . Latex     REACTION: rash from adhesive  . Tape Rash    Paper tape is ok to use     Chief Complaint  Patient presents with  . Medical Managment of Chronic Issues  . Hospitalization Follow-up    HPI: Patient is a 79 y.o. male seen in the SNF at [facility] today for evaluation of s/p hospitalization  and other chronic medical conditions. 01/26/13 -01/29/2013 for AKI(presented with elevated serum creatinine and weakness-better after Foley inserted) urinary retention(Foley until f/u Urology and increased Flomax dose), HYPERLIPIDEMIA, MIGRAINE HEADACHE, Difficulty in walking(719.7), Weakness, HTN (hypertension) Inguinal hernia recurrent bilateral s/p lap repair 01/16/2013 (Hx of ventral hernia repair) Hypothyroidism - TSH within normal limits  Problem List Items Addressed This Visit   HYPOTHYROIDISM     Takes Levothyroxine daily except Sunday . Last TSH wnl while in hospital.       ANXIETY     Apparently stable on Zoloft 200mg , Wellbutrin 300mg , and Remeron 45mg       MIGRAINE HEADACHE     Controlled with Topmax.     Chronic constipation     Managed with laxatives.     HTN (hypertension)     Controlled, takes    AKI (acute kidney injury)     Improved.     Acute urinary retention     Foley inserted, Flomax increased, f/u Urology as out patient scheduled.     Renal cyst, left - Primary     Patient will need to followup with urologist in the next 3-7 days. Also of note patient on ultrasound 7.6 cm mildly complex left upper pole renal cyst and patient follows up with urologist would have then recommend further evaluation recommendations from their standpoint       Review of Systems:  Review of Systems  Constitutional: Positive for malaise/fatigue. Negative for fever, chills, weight loss and diaphoresis.  HENT: Positive for  hearing loss. Negative for congestion, ear discharge, ear pain, nosebleeds, sore throat and tinnitus.   Eyes: Negative for blurred vision, double vision, photophobia, pain, discharge and redness.  Respiratory: Negative for cough, hemoptysis, sputum production, shortness of breath, wheezing and stridor.   Cardiovascular: Positive for PND. Negative for chest pain, palpitations, orthopnea, claudication and leg swelling.  Gastrointestinal: Negative for heartburn, nausea, vomiting, abdominal pain, diarrhea, constipation, blood in stool and melena.  Genitourinary: Negative for dysuria, urgency, frequency, hematuria and flank pain.  Musculoskeletal: Positive for back pain, falls, joint pain and neck pain. Negative for myalgias.  Skin: Negative for itching and rash.  Neurological: Positive for sensory change, speech change (low voice), weakness and headaches (treated). Negative for dizziness, tingling, tremors, focal weakness, seizures and loss of consciousness.  Endo/Heme/Allergies: Negative for environmental allergies and polydipsia. Does not bruise/bleed easily.  Psychiatric/Behavioral: Positive for depression and memory loss. Negative for suicidal ideas, hallucinations and substance abuse. The patient is nervous/anxious. The patient does not have insomnia.      Past Medical History  Diagnosis Date  . Migraines     Dr [name]  . Peripheral neuropathy   . Benign essential tremor   . Cervical stenosis of spine   . Anemia     B12 deficiency  . Testosterone deficiency     Dr [name], [facility]  . Hypothyroidism   . Hyperlipidemia   . Depression  Dr [name]  . History of shingles 2009  . H/O cardiovascular stress test     a. 02/2003 -> no ischemia/infarct.  Marland Kitchen Hx of echocardiogram     Echocardiogram 08/01/12: Mild LVH, EF 55-60%, normal wall motion.  . Sinus bradycardia   . PAD (peripheral artery disease)   . Carotid stenosis     s/p L CEA  . Fecal impaction 10/11/2012   Past Surgical  History  Procedure Laterality Date  . Appendectomy  1941  . Lumbar laminectomy    . Carotid endarterectomy       L  . Tonsillectomy and adenoidectomy    . Total hip arthroplasty      L  . Cataract extraction Right   . Eye surgery Right 02/2012    retina  . Inguinal hernia repair Bilateral 01/16/2013    Procedure: LAPAROSCOPIC BILATERAL INGUINAL DIRECT AND INDIRECT, FEMORAL, AND OBTURATOR HERNIAS;  Surgeon: Ardeth Sportsman, MD;  Location: [facility] OR;  Service: General;  Laterality: Bilateral;  . Insertion of mesh N/A 01/16/2013    Procedure: INSERTION OF MESH;  Surgeon: Ardeth Sportsman, MD;  Location: [facility] OR;  Service: General;  Laterality: N/A;  . Breast cyst excision Left 01/16/2013    Procedure: MASS EXCISION AXILLA;  Surgeon: Ardeth Sportsman, MD;  Location: [facility] OR;  Service: General;  Laterality: Left;   Social History:   reports that he quit smoking about 33 years ago. His smoking use included Pipe and Cigars. He has never used smokeless tobacco. He reports that he drinks about 4.2 ounces of alcohol per week. He reports that he does not use illicit drugs.  Family History  Problem Relation Age of Onset  . Stroke Mother 31  . Hypertension Mother   . Diabetes Mother   . Rectal cancer Father 27  . Heart disease Father     in 86s  . Cancer Father     colorectal  . Nephritis Sister   . Seizures Brother     died as child    Medications: Patient's Medications  New Prescriptions   No medications on file  Previous Medications   ASPIRIN 81 MG TABLET    Take 81 mg by mouth daily.   ATORVASTATIN (LIPITOR) 20 MG TABLET    Take 20 mg by mouth daily.   B COMPLEX VITAMINS CAPSULE    Take 1 capsule by mouth daily.   BIMATOPROST (LUMIGAN) 0.01 % SOLN    Place 1 drop into both eyes at bedtime.   BUPROPION (WELLBUTRIN XL) 300 MG 24 HR TABLET    Take 300 mg by mouth daily.   CHOLECALCIFEROL (VITAMIN D3) 2000 UNITS TABS    Take 4,000 Units by mouth daily.    CLONAZEPAM (KLONOPIN) 0.5 MG TABLET     Take 0.5 mg by mouth 3 (three) times daily as needed for anxiety.    FLAXSEED, LINSEED, (FLAX SEEDS PO)    Take 1 tablet by mouth daily.    KRILL OIL 500 MG CAPS    Take 500 mg by mouth daily.    LEVOTHYROXINE (SYNTHROID, LEVOTHROID) 50 MCG TABLET    Take 25-50 mcg by mouth See admin instructions. Take 1/2 tab ( ) on Sundays, 1 tab ( ) on all other days.   LINACLOTIDE (LINZESS) 290 MCG CAPS CAPSULE    Take 1 capsule (290 mcg total) by mouth daily.   MIRTAZAPINE (REMERON) 45 MG TABLET    Take 45 mg by mouth at bedtime.   MULTIPLE VITAMINS-MINERALS (ICAPS PO)  Take 1 capsule by mouth 2 (two) times daily.    MUPIROCIN OINTMENT (BACTROBAN) 2 %    Applied twice a day to the affected area;NOT into eyes.   OXYCODONE (OXY IR/ROXICODONE) 5 MG IMMEDIATE RELEASE TABLET    Take 1 tablet (5 mg total) by mouth every 6 (six) hours as needed for moderate pain or severe pain.   POLYETHYLENE GLYCOL (MIRALAX / GLYCOLAX) PACKET    Take 17 g by mouth daily as needed for mild constipation. 3 capfuls daily   SERTRALINE (ZOLOFT) 100 MG TABLET    Take 200 mg by mouth daily.    TAMSULOSIN (FLOMAX) 0.4 MG CAPS CAPSULE    Take 2 capsules (0.8 mg total) by mouth daily.   TESTOSTERONE ([name]) 50 MG/5GM GEL    Place 5 g onto the skin daily.   TOPIRAMATE (TOPAMAX) 25 MG TABLET    Take 25-50 mg by mouth 2 (two) times daily. 1 tablet (25mg ) in the morning and 2 tablets (50mg ) in the evening.   VITAMIN B-12 (CYANOCOBALAMIN) 1000 MCG TABLET    Take 1,000 mcg by mouth daily.  Modified Medications   No medications on file  Discontinued Medications   No medications on file     Physical Exam: Physical Exam  Constitutional: He is oriented to person, place, and time. He appears well-developed and well-nourished. No distress.  HENT:  Head: Normocephalic and atraumatic.  Right Ear: External ear normal.  Left Ear: External ear normal.  Nose: Nose normal.  Mouth/Throat: Oropharynx is clear and moist. No oropharyngeal  exudate.  Eyes: Conjunctivae and EOM are normal. Pupils are equal, round, and reactive to light. Right eye exhibits no discharge. Left eye exhibits no discharge. No scleral icterus.  Neck: Normal range of motion. Neck supple. No JVD present. No tracheal deviation present. No thyromegaly present.  Cardiovascular: Normal rate, regular rhythm, normal heart sounds and intact distal pulses.   No murmur heard. Pulmonary/Chest: Effort normal and breath sounds normal. No stridor. No respiratory distress. He has no wheezes. He has no rales. He exhibits no tenderness.  Abdominal: Soft. Bowel sounds are normal. He exhibits no distension. There is no tenderness. There is no rebound and no guarding.  Genitourinary:  Foley  Musculoskeletal: Normal range of motion. He exhibits no edema and no tenderness.  Lymphadenopathy:    He has no cervical adenopathy.  Neurological: He is alert and oriented to person, place, and time. He has normal reflexes. He displays normal reflexes. No cranial nerve deficit. He exhibits normal muscle tone. Coordination normal.  Skin: Skin is warm and dry. No rash noted. He is not diaphoretic. No erythema. No pallor.  abd inguinal hernia repair laparotomy scars.   Psychiatric: His mood appears anxious. His affect is not angry, not blunt, not labile and not inappropriate. His speech is not rapid and/or pressured, not delayed, not tangential and not slurred. He is not agitated, not aggressive, not hyperactive, not slowed, not withdrawn, not actively hallucinating and not combative. Thought content is not paranoid and not delusional. Cognition and memory are impaired. He does not express impulsivity or inappropriate judgment. He exhibits a depressed mood. He expresses no homicidal and no suicidal ideation. He expresses no suicidal plans and no homicidal plans. He is communicative. He exhibits abnormal recent memory. He exhibits normal remote memory. He is attentive.    Filed Vitals:    01/30/13 2128  BP: 148/70  Pulse: 61  Temp: 98.4 F (36.9 C)  TempSrc: Tympanic  Resp: 18  Labs reviewed: Basic Metabolic Panel:  Recent Labs  16/10/96 2118 07/31/12 0203  09/08/12 1114  01/26/13 1900 01/26/13 2210 01/27/13 0500 01/28/13 0525  NA 137  --   < >  --   < > 138  --  139 143  K 4.5  --   < >  --   < > 3.9  --  4.0 3.8  CL 105  --   < >  --   < > 106  --  109 117*  CO2 27  --   < >  --   < > 20  --  19 21  GLUCOSE 99  --   < >  --   < > 112*  --  148* 138*  BUN 34*  --   < >  --   < > 75*  --  69* 46*  CREATININE 1.40* 1.30  < >  --   < > 2.63*  --  2.85* 1.68*  CALCIUM 9.0  --   < >  --   < > 8.9  --  8.0* 7.7*  MG  --  2.1  --   --   --   --   --   --   --   TSH  --  1.436  --  2.59  --   --  2.756  --   --   < > = values in this interval not displayed. Liver Function Tests:  Recent Labs  07/30/12 2118 09/08/12 1114 01/26/13 1900  AST 15 15 16   ALT 12 13 14   ALKPHOS 36* 39 49  BILITOT 0.2* 0.9 0.7  PROT 5.9* 6.3 6.1  ALBUMIN 3.5 3.9 2.7*    Recent Labs  01/26/13 1900  LIPASE 15   CBC:  Recent Labs  07/30/12 2118  07/31/12 0455 01/09/13 1200 01/26/13 1855  WBC 5.0  < > 5.1 6.2 6.6  NEUTROABS 2.8  --   --   --  4.9  HGB 15.2  < > 14.4 14.1 11.4*  HCT 45.0  < > 43.0 42.6 35.2*  MCV 92.4  < > 92.1 93.4 91.2  PLT 124*  < > 109* 130* 196  < > = values in this interval not displayed. Lipid Panel:  Recent Labs  09/08/12 1114  CHOL 207*  HDL 46.90  TRIG 98.0  CHOLHDL 4  LDLDIRECT 132.5   Assessment/Plan Renal cyst, left Patient will need to followup with urologist in the next 3-7 days. Also of note patient on ultrasound 7.6 cm mildly complex left upper pole renal cyst and patient follows up with urologist would have then recommend further evaluation recommendations from their standpoint  Acute urinary retention Foley inserted, Flomax increased, f/u Urology as out patient scheduled.   AKI (acute kidney injury) Improved.    HTN (hypertension) Controlled, takes  Chronic constipation Managed with laxatives.   MIGRAINE HEADACHE Controlled with Topmax.   ANXIETY Apparently stable on Zoloft 200mg , Wellbutrin 300mg , and Remeron 45mg     HYPOTHYROIDISM Takes Levothyroxine daily except Sunday . Last TSH wnl while in hospital.       Family/ Staff Communication: observe the patient.   Goals of Care: SNF  Labs/tests ordered: none

## 2013-01-30 NOTE — Assessment & Plan Note (Signed)
Managed with laxatives.  

## 2013-01-30 NOTE — Assessment & Plan Note (Signed)
Improved

## 2013-01-30 NOTE — Assessment & Plan Note (Signed)
Patient will need to followup with urologist in the next 3-7 days. Also of note patient on ultrasound 7.6 cm mildly complex left upper pole renal cyst and patient follows up with urologist would have then recommend further evaluation recommendations from their standpoint

## 2013-02-05 DIAGNOSIS — R339 Retention of urine, unspecified: Secondary | ICD-10-CM | POA: Insufficient documentation

## 2013-02-05 HISTORY — DX: Retention of urine, unspecified: R33.9

## 2013-02-06 ENCOUNTER — Non-Acute Institutional Stay (SKILLED_NURSING_FACILITY): Payer: Medicare Other | Admitting: Internal Medicine

## 2013-02-06 DIAGNOSIS — R338 Other retention of urine: Secondary | ICD-10-CM

## 2013-02-06 DIAGNOSIS — R600 Localized edema: Secondary | ICD-10-CM

## 2013-02-06 DIAGNOSIS — R5381 Other malaise: Secondary | ICD-10-CM

## 2013-02-06 DIAGNOSIS — N179 Acute kidney failure, unspecified: Secondary | ICD-10-CM

## 2013-02-06 DIAGNOSIS — R262 Difficulty in walking, not elsewhere classified: Secondary | ICD-10-CM

## 2013-02-06 DIAGNOSIS — N312 Flaccid neuropathic bladder, not elsewhere classified: Secondary | ICD-10-CM

## 2013-02-06 DIAGNOSIS — R609 Edema, unspecified: Secondary | ICD-10-CM

## 2013-02-06 DIAGNOSIS — R531 Weakness: Secondary | ICD-10-CM

## 2013-02-13 ENCOUNTER — Encounter: Payer: Self-pay | Admitting: Nurse Practitioner

## 2013-02-13 ENCOUNTER — Non-Acute Institutional Stay (SKILLED_NURSING_FACILITY): Payer: Medicare Other | Admitting: Nurse Practitioner

## 2013-02-13 DIAGNOSIS — N390 Urinary tract infection, site not specified: Secondary | ICD-10-CM | POA: Insufficient documentation

## 2013-02-13 DIAGNOSIS — E039 Hypothyroidism, unspecified: Secondary | ICD-10-CM

## 2013-02-13 DIAGNOSIS — K5909 Other constipation: Secondary | ICD-10-CM

## 2013-02-13 DIAGNOSIS — F411 Generalized anxiety disorder: Secondary | ICD-10-CM

## 2013-02-13 DIAGNOSIS — I10 Essential (primary) hypertension: Secondary | ICD-10-CM

## 2013-02-13 DIAGNOSIS — K5900 Constipation, unspecified: Secondary | ICD-10-CM

## 2013-02-13 HISTORY — DX: Urinary tract infection, site not specified: N39.0

## 2013-02-13 NOTE — Assessment & Plan Note (Signed)
Stable

## 2013-02-13 NOTE — Assessment & Plan Note (Signed)
Takes Levothyroxine 50mcg daily except Sunday 25mcg. Last TSH wnl while in hospital.    

## 2013-02-13 NOTE — Progress Notes (Signed)
Patient ID: Jose Leach, male   DOB: [birth year], 79 y.o.   MRN: [id number] Code Status: Full Code  Allergies  Allergen Reactions  . Latex     REACTION: rash from adhesive  . Tape Rash    Paper tape is ok to use     Chief Complaint  Patient presents with  . Medical Managment of Chronic Issues    UTI  . Acute Visit    HPI: Patient is a 79 y.o. male seen in the SNF at [facility]  today for evaluation of UTI and other chronic medical conditions.  Problem List Items Addressed This Visit   HYPOTHYROIDISM     Takes Levothyroxine daily except Sunday 25mcg. Last TSH wnl while in hospital.         ANXIETY     Apparently stable on Zoloft 200mg, Wellbutrin 300mg, and Remeron 45mg      ESOPHAGITIS     Stable    Chronic constipation     Managed with laxatives.       HTN (hypertension)     Controlled      Urinary tract infection, site not specified - Primary     12 /27/14 E Coli and K. Pneumoniae--Macrodantin 100mg  bid for 7 days started 02/10/13-asymptomatic upon my visit today.        Review of Systems:  Review of Systems  Constitutional: Positive for malaise/fatigue. Negative for fever, chills, weight loss and diaphoresis.  HENT: Positive for hearing loss. Negative for congestion, ear discharge, ear pain, nosebleeds, sore throat and tinnitus.   Eyes: Negative for blurred vision, double vision, photophobia, pain, discharge and redness.  Respiratory: Negative for cough, hemoptysis, sputum production, shortness of breath, wheezing and stridor.   Cardiovascular: Positive for PND. Negative for chest pain, palpitations, orthopnea, claudication and leg swelling.  Gastrointestinal: Negative for heartburn, nausea, vomiting, abdominal pain, diarrhea, constipation, blood in stool and melena.  Genitourinary: Negative for dysuria, urgency, frequency, hematuria and flank pain.  Musculoskeletal: Positive for back pain, falls, joint pain and neck pain. Negative for  myalgias.  Skin: Negative for itching and rash.  Neurological: Positive for sensory change, speech change (low voice), weakness and headaches (treated). Negative for dizziness, tingling, tremors, focal weakness, seizures and loss of consciousness.  Endo/Heme/Allergies: Negative for environmental allergies and polydipsia. Does not bruise/bleed easily.  Psychiatric/Behavioral: Positive for depression and memory loss. Negative for suicidal ideas, hallucinations and substance abuse. The patient is nervous/anxious. The patient does not have insomnia.      Past Medical History  Diagnosis Date  . Migraines     Dr [name]  . Peripheral neuropathy   . Benign essential tremor   . Cervical stenosis of spine   . Anemia     B12 deficiency  . Testosterone deficiency     Dr [name], [facility]  . Hypothyroidism   . Hyperlipidemia   . Depression     Dr [name]  . History of shingles 2009  . H/O cardiovascular stress test     a. 02/2003 -> no ischemia/infarct.  Marland Kitchen Hx of echocardiogram     Echocardiogram 08/01/12: Mild LVH, EF 55-60%, normal wall motion.  . Sinus bradycardia   . PAD (peripheral artery disease)   . Carotid stenosis     s/p L CEA  . Fecal impaction 10/11/2012   Past Surgical History  Procedure Laterality Date  . Appendectomy  1941  . Lumbar laminectomy    . Carotid endarterectomy       L  .  Tonsillectomy and adenoidectomy    . Total hip arthroplasty      L  . Cataract extraction Right   . Eye surgery Right 02/2012    retina  . Inguinal hernia repair Bilateral 01/16/2013    Procedure: LAPAROSCOPIC BILATERAL INGUINAL DIRECT AND INDIRECT, FEMORAL, AND OBTURATOR HERNIAS;  Surgeon: Ardeth Sportsman, MD;  Location: [facility] OR;  Service: General;  Laterality: Bilateral;  . Insertion of mesh N/A 01/16/2013    Procedure: INSERTION OF MESH;  Surgeon: Ardeth Sportsman, MD;  Location: [facility] OR;  Service: General;  Laterality: N/A;  . Breast cyst excision Left 01/16/2013    Procedure: MASS EXCISION  AXILLA;  Surgeon: Ardeth Sportsman, MD;  Location: [facility] OR;  Service: General;  Laterality: Left;   Social History:   reports that he quit smoking about 34 years ago. His smoking use included Pipe and Cigars. He has never used smokeless tobacco. He reports that he drinks about 4.2 ounces of alcohol per week. He reports that he does not use illicit drugs.  Family History  Problem Relation Age of Onset  . Stroke Mother 58  . Hypertension Mother   . Diabetes Mother   . Rectal cancer Father 48  . Heart disease Father     in 63s  . Cancer Father     colorectal  . Nephritis Sister   . Seizures Brother     died as child    Medications: Patient's Medications  New Prescriptions   No medications on file  Previous Medications   ASPIRIN 81 MG TABLET    Take 81 mg by mouth daily.   ATORVASTATIN (LIPITOR) 20 MG TABLET    Take 20 mg by mouth daily.   B COMPLEX VITAMINS CAPSULE    Take 1 capsule by mouth daily.   BIMATOPROST (LUMIGAN) 0.01 % SOLN    Place 1 drop into both eyes at bedtime.   BUPROPION (WELLBUTRIN XL) 300 MG 24 HR TABLET    Take 300 mg by mouth daily.   CHOLECALCIFEROL (VITAMIN D3) 2000 UNITS TABS    Take 4,000 Units by mouth daily.    CLONAZEPAM (KLONOPIN) 0.5 MG TABLET    Take 0.5 mg by mouth 3 (three) times daily as needed for anxiety.    FLAXSEED, LINSEED, (FLAX SEEDS PO)    Take 1 tablet by mouth daily.    KRILL OIL 500 MG CAPS    Take 500 mg by mouth daily.    LEVOTHYROXINE (SYNTHROID, LEVOTHROID) 50 MCG TABLET    Take 25-50 mcg by mouth See admin instructions. Take 1/2 tab ( ) on Sundays, 1 tab ( ) on all other days.   LINACLOTIDE (LINZESS) 290 MCG CAPS CAPSULE    Take 1 capsule (290 mcg total) by mouth daily.   MIRTAZAPINE (REMERON) 45 MG TABLET    Take 45 mg by mouth at bedtime.   MULTIPLE VITAMINS-MINERALS (ICAPS PO)    Take 1 capsule by mouth 2 (two) times daily.    MUPIROCIN OINTMENT (BACTROBAN) 2 %    Applied twice a day to the affected area;NOT into eyes.    OXYCODONE (OXY IR/ROXICODONE) 5 MG IMMEDIATE RELEASE TABLET    Take 1 tablet (5 mg total) by mouth every 6 (six) hours as needed for moderate pain or severe pain.   POLYETHYLENE GLYCOL (MIRALAX / GLYCOLAX) PACKET    Take 17 g by mouth daily as needed for mild constipation. 3 capfuls daily   SERTRALINE (ZOLOFT) 100 MG TABLET    Take  200 mg by mouth daily.    TAMSULOSIN (FLOMAX) 0.4 MG CAPS CAPSULE    Take 2 capsules (0.8 mg total) by mouth daily.   TESTOSTERONE ([name]) 50 MG/5GM GEL    Place 5 g onto the skin daily.   TOPIRAMATE (TOPAMAX) 25 MG TABLET    Take 25-50 mg by mouth 2 (two) times daily. 1 tablet (25mg ) in the morning and 2 tablets (50mg ) in the evening.   VITAMIN B-12 (CYANOCOBALAMIN) 1000 MCG TABLET    Take 1,000 mcg by mouth daily.  Modified Medications   No medications on file  Discontinued Medications   No medications on file     Physical Exam: Physical Exam  Constitutional: He is oriented to person, place, and time. He appears well-developed and well-nourished. No distress.  HENT:  Head: Normocephalic and atraumatic.  Right Ear: External ear normal.  Left Ear: External ear normal.  Nose: Nose normal.  Mouth/Throat: Oropharynx is clear and moist. No oropharyngeal exudate.  Eyes: Conjunctivae and EOM are normal. Pupils are equal, round, and reactive to light. Right eye exhibits no discharge. Left eye exhibits no discharge. No scleral icterus.  Neck: Normal range of motion. Neck supple. No JVD present. No tracheal deviation present. No thyromegaly present.  Cardiovascular: Normal rate, regular rhythm, normal heart sounds and intact distal pulses.   No murmur heard. Pulmonary/Chest: Effort normal and breath sounds normal. No stridor. No respiratory distress. He has no wheezes. He has no rales. He exhibits no tenderness.  Abdominal: Soft. Bowel sounds are normal. He exhibits no distension. There is no tenderness. There is no rebound and no guarding.  Musculoskeletal: Normal  range of motion. He exhibits no edema and no tenderness.  Lymphadenopathy:    He has no cervical adenopathy.  Neurological: He is alert and oriented to person, place, and time. He has normal reflexes. No cranial nerve deficit. He exhibits normal muscle tone. Coordination normal.  Skin: Skin is warm and dry. No rash noted. He is not diaphoretic. No erythema. No pallor.  Psychiatric: His mood appears anxious. His affect is not angry, not blunt, not labile and not inappropriate. His speech is not rapid and/or pressured, not delayed, not tangential and not slurred. He is not agitated, not aggressive, not hyperactive, not slowed, not withdrawn, not actively hallucinating and not combative. Thought content is not paranoid and not delusional. Cognition and memory are impaired. He does not express impulsivity or inappropriate judgment. He exhibits a depressed mood. He expresses no homicidal and no suicidal ideation. He expresses no suicidal plans and no homicidal plans. He is communicative. He exhibits abnormal recent memory. He exhibits normal remote memory. He is attentive.    Filed Vitals:   02/13/13 2308  BP: 138/72  Pulse: 76  Temp: 97.4 F (36.3 C)  TempSrc: Tympanic  Resp: 20      Labs reviewed: Basic Metabolic Panel:  Recent Labs  30/86/57 2118 07/31/12 0203  09/08/12 1114  01/26/13 1900 01/26/13 2210 01/27/13 0500 01/28/13 0525  NA 137  --   < >  --   < > 138  --  139 143  K 4.5  --   < >  --   < > 3.9  --  4.0 3.8  CL 105  --   < >  --   < > 106  --  109 117*  CO2 27  --   < >  --   < > 20  --  19 21  GLUCOSE 99  --   < >  --   < >  112*  --  148* 138*  BUN 34*  --   < >  --   < > 75*  --  69* 46*  CREATININE 1.40* 1.30  < >  --   < > 2.63*  --  2.85* 1.68*  CALCIUM 9.0  --   < >  --   < > 8.9  --  8.0* 7.7*  MG  --  2.1  --   --   --   --   --   --   --   TSH  --  1.436  --  2.59  --   --  2.756  --   --   < > = values in this interval not displayed. Liver Function  Tests:  Recent Labs  07/30/12 2118 09/08/12 1114 01/26/13 1900  AST 15 15 16   ALT 12 13 14   ALKPHOS 36* 39 49  BILITOT 0.2* 0.9 0.7  PROT 5.9* 6.3 6.1  ALBUMIN 3.5 3.9 2.7*    Recent Labs  01/26/13 1900  LIPASE 15  CBC:  Recent Labs  07/30/12 2118  07/31/12 0455 01/09/13 1200 01/26/13 1855  WBC 5.0  < > 5.1 6.2 6.6  NEUTROABS 2.8  --   --   --  4.9  HGB 15.2  < > 14.4 14.1 11.4*  HCT 45.0  < > 43.0 42.6 35.2*  MCV 92.4  < > 92.1 93.4 91.2  PLT 124*  < > 109* 130* 196  < > = values in this interval not displayed.  Recent Labs  08/02/12 0425  VITAMINB12 859    Past Procedures:  08/02/12 2D echocardiogram:  LV EF: 55% -   60%  07/30/12 CT head  IMPRESSION: 1.  No acute intracranial abnormalities. 2.  Mild cerebral and cerebellar atrophy with chronic microvascular ischemic changes of the cerebral white matter, similar to the prior Study.  08/01/12 MRI brain:  IMPRESSION: Motion degraded exam.   No acute infarct.   Moderate small vessel disease type changes.   Global atrophy.     Assessment/Plan Urinary tract infection, site not specified 02/10/13 E Coli and K. Pneumoniae--Macrodantin 100mg  bid for 7 days started 02/10/13-asymptomatic upon my visit today.   HTN (hypertension) Controlled    Chronic constipation Managed with laxatives.     ESOPHAGITIS Stable  ANXIETY Apparently stable on Zoloft 200mg , Wellbutrin 300mg , and Remeron 45mg     HYPOTHYROIDISM Takes Levothyroxine daily except Sunday . Last TSH wnl while in hospital.         Family/ Staff Communication: discharge home with in home PT/OT  Goals of Care: [place]  Labs/tests ordered: none.

## 2013-02-13 NOTE — Assessment & Plan Note (Signed)
Managed with laxatives.  

## 2013-02-13 NOTE — Assessment & Plan Note (Signed)
Apparently stable on Zoloft 200mg , Wellbutrin 300mg , and Remeron 45mg 

## 2013-02-13 NOTE — Assessment & Plan Note (Signed)
02/10/13 E Coli and K. Pneumoniae--Macrodantin 100mg bid for 7 days started 02/10/13-asymptomatic upon my visit today.    

## 2013-02-13 NOTE — Assessment & Plan Note (Signed)
Controlled.  

## 2013-02-14 ENCOUNTER — Ambulatory Visit (INDEPENDENT_AMBULATORY_CARE_PROVIDER_SITE_OTHER): Payer: Medicare Other | Admitting: Surgery

## 2013-02-14 ENCOUNTER — Encounter (INDEPENDENT_AMBULATORY_CARE_PROVIDER_SITE_OTHER): Payer: Self-pay | Admitting: Surgery

## 2013-02-14 VITALS — BP 118/76 | HR 60 | Temp 98.7°F | Resp 14 | Ht 73.0 in | Wt 152.0 lb

## 2013-02-14 DIAGNOSIS — R2232 Localized swelling, mass and lump, left upper limb: Secondary | ICD-10-CM

## 2013-02-14 DIAGNOSIS — K4021 Bilateral inguinal hernia, without obstruction or gangrene, recurrent: Secondary | ICD-10-CM

## 2013-02-14 DIAGNOSIS — R229 Localized swelling, mass and lump, unspecified: Secondary | ICD-10-CM

## 2013-02-14 DIAGNOSIS — K4120 Bilateral femoral hernia, without obstruction or gangrene, not specified as recurrent: Secondary | ICD-10-CM

## 2013-02-14 DIAGNOSIS — K458 Other specified abdominal hernia without obstruction or gangrene: Secondary | ICD-10-CM

## 2013-02-14 NOTE — Patient Instructions (Signed)
Improve her pain control.  Take Tylenol 500 mg extra strength tabs.  2 tabs 3 times a day.  Heat and/or icepack 6 times a day.  Use oxycodone for breakthrough pain only.  Continue working with physical therapy and he.  Exercises as tolerated.  No specific restrictions aside from do not push through pain.  Exercise tolerance should continue to improve over the next few months.  Control constipation.  Continue Lizness.  Add MiraLax twice a day.  Goal is one soft bowel movement a day.  Keep her appointment with Dr. Earlene Plater with urology to help troubleshoot your bladder/urinary issues  GETTING TO GOOD BOWEL HEALTH. Irregular bowel habits such as constipation and diarrhea can lead to many problems over time.  Having one soft bowel movement a day is the most important way to prevent further problems.  The anorectal canal is designed to handle stretching and feces to safely manage our ability to get rid of solid waste (feces, poop, stool) out of our body.  BUT, hard constipated stools can act like ripping concrete bricks and diarrhea can be a burning fire to this very sensitive area of our body, causing inflamed hemorrhoids, anal fissures, increasing risk is perirectal abscesses, abdominal pain/bloating, an making irritable bowel worse.     The goal: ONE SOFT BOWEL MOVEMENT A DAY!  To have soft, regular bowel movements:    Drink at least 8 tall glasses of water a day.     Take plenty of fiber.  Fiber is the undigested part of plant food that passes into the colon, acting s "natures broom" to encourage bowel motility and movement.  Fiber can absorb and hold large amounts of water. This results in a larger, bulkier stool, which is soft and easier to pass. Work gradually over several weeks up to 6 servings a day of fiber (25g a day even more if needed) in the form of: o Vegetables -- Root (potatoes, carrots, turnips), leafy green (lettuce, salad greens, celery, spinach), or cooked high residue (cabbage, broccoli,  etc) o Fruit -- Fresh (unpeeled skin & pulp), Dried (prunes, apricots, cherries, etc ),  or stewed ( applesauce)  o Whole grain breads, pasta, etc (whole wheat)  o Bran cereals    Bulking Agents -- This type of water-retaining fiber generally is easily obtained each day by one of the following:  o Psyllium bran -- The psyllium plant is remarkable because its ground seeds can retain so much water. This product is available as Metamucil, Konsyl, Effersyllium, Per Diem Fiber, or the less expensive generic preparation in drug and health food stores. Although labeled a laxative, it really is not a laxative.  o Methylcellulose -- This is another fiber derived from wood which also retains water. It is available as Citrucel. o Polyethylene Glycol - and "artificial" fiber commonly called Miralax or Glycolax.  It is helpful for people with gassy or bloated feelings with regular fiber o Flax Seed - a less gassy fiber than psyllium   No reading or other relaxing activity while on the toilet. If bowel movements take longer than 5 minutes, you are too constipated   AVOID CONSTIPATION.  High fiber and water intake usually takes care of this.  Sometimes a laxative is needed to stimulate more frequent bowel movements, but    Laxatives are not a good long-term solution as it can wear the colon out. o Osmotics (Milk of Magnesia, Fleets phosphosoda, Magnesium citrate, MiraLax, GoLytely) are safer than  o Stimulants (Senokot, Castor Oil,  Dulcolax, Ex Lax)    o Do not take laxatives for more than 7days in a row.    IF SEVERELY CONSTIPATED, try a Bowel Retraining Program: o Do not use laxatives.  o Eat a diet high in roughage, such as bran cereals and leafy vegetables.  o Drink six (6) ounces of prune or apricot juice each morning.  o Eat two (2) large servings of stewed fruit each day.  o Take one (1) heaping tablespoon of a psyllium-based bulking agent twice a day. Use sugar-free sweetener when possible to avoid  excessive calories.  o Eat a normal breakfast.  o Set aside 15 minutes after breakfast to sit on the toilet, but do not strain to have a bowel movement.  o If you do not have a bowel movement by the third day, use an enema and repeat the above steps.    Controlling diarrhea o Switch to liquids and simpler foods for a few days to avoid stressing your intestines further. o Avoid dairy products (especially milk & ice cream) for a short time.  The intestines often can lose the ability to digest lactose when stressed. o Avoid foods that cause gassiness or bloating.  Typical foods include beans and other legumes, cabbage, broccoli, and dairy foods.  Every person has some sensitivity to other foods, so listen to our body and avoid those foods that trigger problems for you. o Adding fiber (Citrucel, Metamucil, psyllium, Miralax) gradually can help thicken stools by absorbing excess fluid and retrain the intestines to act more normally.  Slowly increase the dose over a few weeks.  Too much fiber too soon can backfire and cause cramping & bloating. o Probiotics (such as active yogurt, Align, etc) may help repopulate the intestines and colon with normal bacteria and calm down a sensitive digestive tract.  Most studies show it to be of mild help, though, and such products can be costly. o Medicines:   Bismuth subsalicylate (ex. Kayopectate, Pepto Bismol) every 30 minutes for up to 6 doses can help control diarrhea.  Avoid if pregnant.   Loperamide (Immodium) can slow down diarrhea.  Start with two tablets (4mg  total) first and then try one tablet every 6 hours.  Avoid if you are having fevers or severe pain.  If you are not better or start feeling worse, stop all medicines and call your doctor for advice o Call your doctor if you are getting worse or not better.  Sometimes further testing (cultures, endoscopy, X-ray studies, bloodwork, etc) may be needed to help diagnose and treat the cause of the  diarrhea.  HERNIA REPAIR: POST OP INSTRUCTIONS  1. DIET: Follow a light bland diet the first 24 hours after arrival home, such as soup, liquids, crackers, etc.  Be sure to include lots of fluids daily.  Avoid fast food or heavy meals as your are more likely to get nauseated.  Eat a low fat the next few days after surgery. 2. Take your usually prescribed home medications unless otherwise directed. 3. PAIN CONTROL: a. Pain is best controlled by a usual combination of three different methods TOGETHER: i. Ice/Heat ii. Over the counter pain medication iii. Prescription pain medication b. Most patients will experience some swelling and bruising around the hernia(s) such as the bellybutton, groins, or old incisions.  Ice packs or heating pads (30-60 minutes up to 6 times a day) will help. Use ice for the first few days to help decrease swelling and bruising, then switch to heat to help  relax tight/sore spots and speed recovery.  Some people prefer to use ice alone, heat alone, alternating between ice & heat.  Experiment to what works for you.  Swelling and bruising can take several weeks to resolve.   c. It is helpful to take an over-the-counter pain medication regularly for the first few weeks.  Choose one of the following that works best for you: i. Naproxen (Aleve, etc)  Two 220mg  tabs twice a day ii. Ibuprofen (Advil, etc) Three 200mg  tabs four times a day (every meal & bedtime) iii. Acetaminophen (Tylenol, etc) 325-650mg  four times a day (every meal & bedtime) d. A  prescription for pain medication should be given to you upon discharge.  Take your pain medication as prescribed.  i. If you are having problems/concerns with the prescription medicine (does not control pain, nausea, vomiting, rash, itching, etc), please call us [phone] to see if we need to switch you to a different pain medicine that will work better for you and/or control your side effect better. ii. If you need a refill on your  pain medication, please contact your pharmacy.  They will contact our office to request authorization. Prescriptions will not be filled after 5 pm or on week-ends. 4. Avoid getting constipated.  Between the surgery and the pain medications, it is common to experience some constipation.  Increasing fluid intake and taking a fiber supplement (such as Metamucil, Citrucel, FiberCon, MiraLax, etc) 1-2 times a day regularly will usually help prevent this problem from occurring.  A mild laxative (prune juice, Milk of Magnesia, MiraLax, etc) should be taken according to package directions if there are no bowel movements after 48 hours.   5. Wash / shower every day.  You may shower over the dressings as they are waterproof.   6. Remove your waterproof bandages 5 days after surgery.  You may leave the incision open to air.  You may replace a dressing/Band-Aid to cover the incision for comfort if you wish.  Continue to shower over incision(s) after the dressing is off.    7. ACTIVITIES as tolerated:   a. You may resume regular (light) daily activities beginning the next day-such as daily self-care, walking, climbing stairs-gradually increasing activities as tolerated.  If you can walk 30 minutes without difficulty, it is safe to try more intense activity such as jogging, treadmill, bicycling, low-impact aerobics, swimming, etc. b. Save the most intensive and strenuous activity for last such as sit-ups, heavy lifting, contact sports, etc  Refrain from any heavy lifting or straining until you are off narcotics for pain control.   c. DO NOT PUSH THROUGH PAIN.  Let pain be your guide: If it hurts to do something, don't do it.  Pain is your body warning you to avoid that activity for another week until the pain goes down. d. You may drive when you are no longer taking prescription pain medication, you can comfortably wear a seatbelt, and you can safely maneuver your car and apply brakes. e. Bonita Quin may have sexual  intercourse when it is comfortable.  8. FOLLOW UP in our office a. Please call [facility] at [phone] to set up an appointment to see your surgeon in the office for a follow-up appointment approximately 2-3 weeks after your surgery. b. Make sure that you call for this appointment the day you arrive home to insure a convenient appointment time. 9.  IF YOU HAVE DISABILITY OR FAMILY LEAVE FORMS, BRING THEM TO THE OFFICE FOR PROCESSING.  DO  NOT GIVE THEM TO YOUR DOCTOR.  WHEN TO CALL us [phone]: 1. Poor pain control 2. Reactions / problems with new medications (rash/itching, nausea, etc)  3. Fever over 101.5 F (38.5 C) 4. Inability to urinate 5. Nausea and/or vomiting 6. Worsening swelling or bruising 7. Continued bleeding from incision. 8. Increased pain, redness, or drainage from the incision   The clinic staff is available to answer your questions during regular business hours (8:30am-5pm).  Please don't hesitate to call and ask to speak to one of our nurses for clinical concerns.   If you have a medical emergency, go to the nearest emergency room or call 911.  A surgeon from [facility] is always on call at the hospitals in [facility], [place] [address], [place], [place]  [address] ?  [address] [address], [place], [place]   [address] MAIN: [phone] ? TOLL FREE: [phone] ? FAX: [phone] [email or web address]

## 2013-02-14 NOTE — Progress Notes (Signed)
Subjective:     Patient ID: Jose Leach, male   DOB: April 24, 1932, 79 y.o.   MRN: [id number]  HPI  Note: This dictation was prepared with Dragon/digital dictation along with [facility] technology. Any transcriptional errors that result from this process are unintentional.       Jose Leach  1933-02-12 [id number]  Patient Care Team: Pecola Lawless, MD as PCP - General (Internal Medicine)  Procedure (Date: 01/16/2013):  POST-OPERATIVE DIAGNOSIS:  BILATERAL INGUINAL HERNIAS  BILATERAL FEMORAL HERNIAS  BILATERAL OBTURATOR HERNIAS  LEFT AXILLARY MASS   PROCEDURE: Procedure(s):  LAPAROSCOPIC BILATERAL INGUINAL DIRECT AND INDIRECT, FEMORAL, AND OBTURATOR HERNIAS  INSERTION OF MESH  MASS EXCISION AXILLA   SURGEON:   Ardeth Sportsman, MD   This patient returns for surgical re-evaluation.  He comes today with his wife.  He struggled with urinary retention.  He went into kidney failure.  Required admission.  Foley catheter placed.  Creatinine normalized rapidly.  Has not been able to void normally since.  Seeing Dr. Earlene Plater with urology to help troubleshoot this.  He had an episode where his right inner thigh/groin and pulled while they were using a sling left on him.  He still has soreness there.  No pain anywhere else.  Physical therapy concerned about what is safe to do as far as exercise goals.  Using oxycodone.  Not using much else for pain control.  Struggling with constipation.  His constipation worsened.  Getting better with the help of gastroenterology.  Not taking any fiber supplement.  They are very confused as to what has been going on.  They had many questions.  Patient Active Problem List   Diagnosis Date Noted  . Urinary tract infection, site not specified 02/13/2013  . Renal cyst, left 01/30/2013  . Acute urinary retention 01/28/2013  . AKI (acute kidney injury) 01/26/2013  . Volume depletion 01/26/2013  . Fecal impaction 01/22/2013  . Mass of left axilla -  prob cyst 01/16/2013  . Femoral hernia, bilateral s/p lap repair 01/16/2013 01/16/2013  . Obturator hernia - bilateral, s/p lap repair 01/16/2013 01/16/2013  . Personal history of colonic polyps 01/05/2013  . Inguinal hernia recurrent bilateral s/p lap repair 01/16/2013 12/06/2012  . Bilateral leg edema 09/13/2012  . HTN (hypertension) 08/22/2012  . Unspecified vitamin D deficiency 08/06/2012  . Weakness 08/02/2012  . Bradycardia, sinus 08/02/2012  . Hyperlipidemia   . Symptomatic sinus bradycardia 07/30/2012  . Chronic constipation 07/24/2012  . Family history of colon cancer 07/24/2012  . Open wound of heel 07/04/2012  . Hx of falling 07/19/2011  . Difficulty in walking(719.7) 04/05/2011  . Balance problems 04/05/2011  . HAMMER TOE 04/23/2010  . ABNORMAL ELECTROCARDIOGRAM 04/16/2009  . MORTON'S NEUROMA, LEFT 08/22/2007  . ANXIETY 06/07/2007  . ARTHRITIS 06/07/2007  . CAROTID ARTERY DISEASE 03/31/2007  . PERIPHERAL VASCULAR DISEASE 03/16/2007  . HYPOTHYROIDISM 11/10/2006  . HYPERLIPIDEMIA 11/10/2006  . MIGRAINE HEADACHE 03/14/2006  . PREMATURE ATRIAL CONTRACTIONS 03/14/2006  . SPINAL STENOSIS 03/14/2006  . ESOPHAGITIS 08/06/2002    Past Medical History  Diagnosis Date  . Migraines     Dr [name]  . Peripheral neuropathy   . Benign essential tremor   . Cervical stenosis of spine   . Anemia     B12 deficiency  . Testosterone deficiency     Dr [name], [facility]  . Hypothyroidism   . Hyperlipidemia   . Depression     Dr [name]  . History of shingles 2009  .  H/O cardiovascular stress test     a. 02/2003 -> no ischemia/infarct.  Marland Kitchen Hx of echocardiogram     Echocardiogram 08/01/12: Mild LVH, EF 55-60%, normal wall motion.  . Sinus bradycardia   . PAD (peripheral artery disease)   . Carotid stenosis     s/p L CEA  . Fecal impaction 10/11/2012    Past Surgical History  Procedure Laterality Date  . Appendectomy  1941  . Lumbar laminectomy    . Carotid endarterectomy         L  . Tonsillectomy and adenoidectomy    . Total hip arthroplasty      L  . Cataract extraction Right   . Eye surgery Right 02/2012    retina  . Inguinal hernia repair Bilateral 01/16/2013    Procedure: LAPAROSCOPIC BILATERAL INGUINAL DIRECT AND INDIRECT, FEMORAL, AND OBTURATOR HERNIAS;  Surgeon: Ardeth Sportsman, MD;  Location: [facility] OR;  Service: General;  Laterality: Bilateral;  . Insertion of mesh N/A 01/16/2013    Procedure: INSERTION OF MESH;  Surgeon: Ardeth Sportsman, MD;  Location: [facility] OR;  Service: General;  Laterality: N/A;  . Breast cyst excision Left 01/16/2013    Procedure: MASS EXCISION AXILLA;  Surgeon: Ardeth Sportsman, MD;  Location: [facility] OR;  Service: General;  Laterality: Left;    History   Social History  . Marital Status: Married    Spouse Name: Alvino Chapel    Number of Children: 2  . Years of Education: N/A   Occupational History  . retired    Social History Main Topics  . Smoking status: Former Smoker -- 10 years    Types: Pipe, Cigars    Quit date: 02/16/1979  . Smokeless tobacco: Never Used     Comment: Quit about age 28   . Alcohol Use: 4.2 oz/week    7 Glasses of wine per week     Comment:    . Drug Use: No  . Sexual Activity: No   Other Topics Concern  . Not on file   Social History Narrative  . No narrative on file    Family History  Problem Relation Age of Onset  . Stroke Mother 54  . Hypertension Mother   . Diabetes Mother   . Rectal cancer Father 46  . Heart disease Father     in 42s  . Cancer Father     colorectal  . Nephritis Sister   . Seizures Brother     died as child    Current Outpatient Prescriptions  Medication Sig Dispense Refill  . acetaminophen (TYLENOL) 325 MG tablet Take 650 mg by mouth every 6 (six) hours as needed.      Marland Kitchen aspirin 81 MG tablet Take 81 mg by mouth daily.      Marland Kitchen atorvastatin (LIPITOR) 20 MG tablet Take 20 mg by mouth daily.      Marland Kitchen b complex vitamins capsule Take 1 capsule by mouth daily.      .  bimatoprost (LUMIGAN) 0.01 % SOLN Place 1 drop into both eyes at bedtime.      Marland Kitchen buPROPion (WELLBUTRIN XL) 300 MG 24 hr tablet Take 300 mg by mouth daily.      . Cholecalciferol (VITAMIN D3) 2000 UNITS TABS Take 4,000 Units by mouth daily.       . clonazePAM (KLONOPIN) 0.5 MG tablet Take 0.5 mg by mouth 3 (three) times daily as needed for anxiety.       . Flaxseed, Linseed, (FLAX SEEDS  PO) Take 1 tablet by mouth daily.       [name] Oil 500 MG CAPS Take 500 mg by mouth daily.       Marland Kitchen levothyroxine (SYNTHROID, LEVOTHROID) 50 MCG tablet Take 25-50 mcg by mouth See admin instructions. Take 1/2 tab ( ) on Sundays, 1 tab ( ) on all other days.      . Linaclotide (LINZESS) 290 MCG CAPS capsule Take 1 capsule (290 mcg total) by mouth daily.  20 capsule  0  . mirtazapine (REMERON) 45 MG tablet Take 45 mg by mouth at bedtime.      . Multiple Vitamins-Minerals (ICAPS PO) Take 1 capsule by mouth 2 (two) times daily.       . mupirocin ointment (BACTROBAN) 2 % Applied twice a day to the affected area;NOT into eyes.  22 g  0  . nitrofurantoin (MACRODANTIN) 100 MG capsule Take 100 mg by mouth 4 (four) times daily.      Marland Kitchen oxyCODONE (OXY IR/ROXICODONE) 5 MG immediate release tablet Take 1 tablet (5 mg total) by mouth every 6 (six) hours as needed for moderate pain or severe pain.  20 tablet  0  . polyethylene glycol (MIRALAX / GLYCOLAX) packet Take 17 g by mouth daily as needed for mild constipation. 3 capfuls daily      . Probiotic Product (PROBIOTIC DAILY PO) Take by mouth.      . sertraline (ZOLOFT) 100 MG tablet Take 200 mg by mouth daily.       . tamsulosin (FLOMAX) 0.4 MG CAPS capsule Take 2 capsules (0.8 mg total) by mouth daily.  30 capsule  0  . testosterone ([name]) 50 MG/5GM GEL Place 5 g onto the skin daily.      Marland Kitchen topiramate (TOPAMAX) 25 MG tablet Take 25-50 mg by mouth 2 (two) times daily. 1 tablet (25mg ) in the morning and 2 tablets (50mg ) in the evening.      . vitamin B-12 (CYANOCOBALAMIN)  1000 MCG tablet Take 1,000 mcg by mouth daily.       No current facility-administered medications for this visit.     Allergies  Allergen Reactions  . Latex     REACTION: rash from adhesive  . Tape Rash    Paper tape is ok to use     BP 118/76  Pulse 60  Temp(Src) 98.7 F (37.1 C) (Temporal)  Resp 14  Ht 6\' 1"  (1.854 m)  Wt 152 lb (68.947 kg)  BMI 20.06 kg/m2  US Renal  01/27/2013   CLINICAL DATA:  Elevated creatinine  EXAM: RENAL/URINARY TRACT ULTRASOUND COMPLETE  COMPARISON:  None.  FINDINGS: Right Kidney:  Length: 10.5 cm. 7 mm lower pole cyst. Mild to moderate hydronephrosis.  Left Kidney:  Length: 13.1 cm. 7.6 x 6.7 x 7.0 cm mildly complex upper pole renal cyst with layering peripheral debris. Mild to moderate hydronephrosis.  Bladder:  Distended bladder with large postvoid residual.  IMPRESSION: Distended bladder with large postvoid residual.  Mild to moderate bilateral hydronephrosis, possibly related to bladder outlet obstruction.  7.6 cm mildly complex left upper pole renal cyst with layering debris.   Electronically Signed   By: Charline Bills M.D.   On: 01/27/2013 15:45   Dg Abd Acute W/chest  01/26/2013   CLINICAL DATA:  Abdominal pain. Weakness. Postop from hernia repair 10 days ago.  EXAM: ACUTE ABDOMEN SERIES (ABDOMEN 2 VIEW & CHEST 1 VIEW)  COMPARISON:  Chest radiograph on 07/30/2012  FINDINGS: Mild gaseous distention of small bowel  and colon noted, consistent with mild postop ileus. No radiopaque calculi or other significant radiographic abnormality is seen. Left hip prosthesis noted.  Heart size and mediastinal contours are within normal limits. Both lungs are clear.  IMPRESSION: Mild postop ileus pattern.  No active cardiopulmonary disease.   Electronically Signed   By: Myles Rosenthal M.D.   On: 01/26/2013 19:54     Review of Systems     Objective:   Physical Exam  Constitutional: He is oriented to person, place, and time. He appears well-developed and  well-nourished. No distress.  HENT:  Head: Normocephalic.  Mouth/Throat: Oropharynx is clear and moist. No oropharyngeal exudate.  Eyes: Conjunctivae and EOM are normal. Pupils are equal, round, and reactive to light. No scleral icterus.  Neck: Normal range of motion. No tracheal deviation present.  Cardiovascular: Normal rate, normal heart sounds and intact distal pulses.   Pulmonary/Chest: Effort normal. No respiratory distress.  Abdominal: Soft. He exhibits no distension. There is no tenderness. No hernia. Hernia confirmed negative in the right inguinal area and confirmed negative in the left inguinal area.    Incisions clean with normal healing ridges.  No hernias  Musculoskeletal: Normal range of motion. He exhibits no tenderness.  Neurological: He is alert and oriented to person, place, and time. No cranial nerve deficit or sensory deficit. He exhibits normal muscle tone. Gait abnormal. Coordination normal. GCS eye subscore is 4. GCS verbal subscore is 5. GCS motor subscore is 6.  Skin: Skin is warm and dry. No rash noted. He is not diaphoretic.  Psychiatric: He has a normal mood and affect. His behavior is normal. Judgment and thought content normal. His speech is delayed. Cognition and memory are normal.  Slow to speak.       Assessment:     Status post repair of numerous pelvic hernias laparoscopically complicated by worsening constipation and urinary retention and deconditioning.  Slowly recovering     Plan:     Increase activity as tolerated to regular activity.  Low impact exercise such as walking an hour a day at least ideal.  Do not push through pain.  Okay to work with physical therapy as tolerated.  Maximize nonnarcotic pain control with Tylenol 1 g 3 times a day.  Ice/heat 6 times a day.  Reserve oxycodone for breakthrough pain only  Diet as tolerated.  Low fat high fiber diet ideal.  Bowel regimen with 30 g fiber a day and fiber supplement as needed to avoid problems.   Then stressed to them that he needs to stay on some fiber supplement every day the rest of his life.  Double the flax seed until he is moving his bowels more regularly.  Defer his IBS issues with gastroenterology.  Troubleshoot urinary issues with urology/Dr. Earlene Plater.  I am sorry he still struggles with this.  Hopefully they can find a way to make it better.  The surgery should not have affected this in the long-term.  Return to clinic 1 month, sooneras needed.   Instructions discussed.  Followup with primary care physician for other health issues as would normally be done.  Questions answered.  The patient expressed understanding and appreciation

## 2013-02-16 ENCOUNTER — Encounter: Payer: Self-pay | Admitting: Nurse Practitioner

## 2013-02-16 ENCOUNTER — Non-Acute Institutional Stay (SKILLED_NURSING_FACILITY): Payer: Medicare Other | Admitting: Nurse Practitioner

## 2013-02-16 DIAGNOSIS — E039 Hypothyroidism, unspecified: Secondary | ICD-10-CM

## 2013-02-16 DIAGNOSIS — F411 Generalized anxiety disorder: Secondary | ICD-10-CM

## 2013-02-16 DIAGNOSIS — K209 Esophagitis, unspecified without bleeding: Secondary | ICD-10-CM

## 2013-02-16 DIAGNOSIS — I10 Essential (primary) hypertension: Secondary | ICD-10-CM

## 2013-02-16 DIAGNOSIS — K5900 Constipation, unspecified: Secondary | ICD-10-CM

## 2013-02-16 DIAGNOSIS — G43909 Migraine, unspecified, not intractable, without status migrainosus: Secondary | ICD-10-CM

## 2013-02-16 DIAGNOSIS — N390 Urinary tract infection, site not specified: Secondary | ICD-10-CM

## 2013-02-16 DIAGNOSIS — K5909 Other constipation: Secondary | ICD-10-CM

## 2013-02-16 DIAGNOSIS — R338 Other retention of urine: Secondary | ICD-10-CM

## 2013-02-16 NOTE — Assessment & Plan Note (Signed)
Controlled.  

## 2013-02-16 NOTE — Assessment & Plan Note (Addendum)
Apparently stable on Zoloft 200mg , Wellbutrin 300mg , and Remeron 45mg . F/u Dr. Neurology Jannifer Franklin 04/18/13

## 2013-02-16 NOTE — Assessment & Plan Note (Signed)
Foley inserted, Flomax increased, f/u Urology as out patient scheduled.

## 2013-02-16 NOTE — Assessment & Plan Note (Signed)
Managed with laxatives. Miralax bid. Continue Lizness.     

## 2013-02-16 NOTE — Assessment & Plan Note (Signed)
Takes Levothyroxine 50mcg daily except Sunday 25mcg. Last TSH wnl while in hospital.    

## 2013-02-16 NOTE — Assessment & Plan Note (Signed)
Controlled with Topmax.   

## 2013-02-16 NOTE — Assessment & Plan Note (Signed)
02/10/13 E Coli and K. Pneumoniae--Macrodantin 100mg  bid for 7 days started 02/10/13-asymptomatic upon my visit today.

## 2013-02-16 NOTE — Progress Notes (Signed)
Patient ID: Jose Leach, male   DOB: 1932/12/01, 80 y.o.   MRN: [id number] Code Status: Full Code  Allergies  Allergen Reactions  . Latex     REACTION: rash from adhesive  . Tape Rash    Paper tape is ok to use     Chief Complaint  Patient presents with  . Medical Managment of Chronic Issues    constipation, abd pain.   . Acute Visit    HPI: Patient is a 80 y.o. male seen in the SNF at [facility]  today for evaluation of urinary retention, hernia(01/16/13 surgical repaired), treated UTI, and other chronic medical conditions.  Problem List Items Addressed This Visit   HYPOTHYROIDISM - Primary     Takes Levothyroxine 30mcg daily except Sunday 25mcg. Last TSH wnl while in hospital.           ANXIETY     Apparently stable on Zoloft 200mg, Wellbutrin 300mg, and Remeron 45mg. F/u Dr. Neurology Willis 04/18/13        MIGRAINE HEADACHE     Controlled with Topmax.       ESOPHAGITIS     Stable      Chronic constipation     Managed with laxatives. Miralax bid. Continue Lizness.         HTN (hypertension)     Controlled        Acute urinary retention     Foley inserted, Flomax increased, f/u Urology as out patient scheduled.       Urinary tract infection, site not specified     12 /27/14 E Coli and K. Pneumoniae--Macrodantin 100mg  bid for 7 days started 02/10/13-asymptomatic upon my visit today.          Review of Systems:  Review of Systems  Constitutional: Positive for malaise/fatigue. Negative for fever, chills, weight loss and diaphoresis.  HENT: Positive for hearing loss. Negative for congestion, ear discharge, ear pain, nosebleeds, sore throat and tinnitus.   Eyes: Negative for blurred vision, double vision, photophobia, pain, discharge and redness.  Respiratory: Negative for cough, hemoptysis, sputum production, shortness of breath, wheezing and stridor.   Cardiovascular: Positive for PND. Negative for chest pain, palpitations,  orthopnea, claudication and leg swelling.  Gastrointestinal: Negative for heartburn, nausea, vomiting, abdominal pain, diarrhea, constipation, blood in stool and melena.  Genitourinary: Negative for dysuria, urgency, frequency, hematuria and flank pain.  Musculoskeletal: Positive for back pain, falls, joint pain and neck pain. Negative for myalgias.  Skin: Negative for itching and rash.  Neurological: Positive for sensory change, speech change (low voice), weakness and headaches (treated). Negative for dizziness, tingling, tremors, focal weakness, seizures and loss of consciousness.  Endo/Heme/Allergies: Negative for environmental allergies and polydipsia. Does not bruise/bleed easily.  Psychiatric/Behavioral: Positive for depression and memory loss. Negative for suicidal ideas, hallucinations and substance abuse. The patient is nervous/anxious. The patient does not have insomnia.      Past Medical History  Diagnosis Date  . Migraines     Dr Jannifer Franklin  . Peripheral neuropathy   . Benign essential tremor   . Cervical stenosis of spine   . Anemia     B12 deficiency  . Testosterone deficiency     Dr Lawerance Bach, [facility]  . Hypothyroidism   . Hyperlipidemia   . Depression     Dr Albertine Patricia  . History of shingles 2009  . H/O cardiovascular stress test     a. 02/2003 -> no ischemia/infarct.  Marland Kitchen Hx of echocardiogram  Echocardiogram 08/01/12: Mild LVH, EF 55-60%, normal wall motion.  . Sinus bradycardia   . PAD (peripheral artery disease)   . Carotid stenosis     s/p L CEA  . Fecal impaction 10/11/2012   Past Surgical History  Procedure Laterality Date  . Appendectomy  1941  . Lumbar laminectomy    . Carotid endarterectomy       L  . Tonsillectomy and adenoidectomy    . Total hip arthroplasty      L  . Cataract extraction Right   . Eye surgery Right 02/2012    retina  . Inguinal hernia repair Bilateral 01/16/2013    Procedure: LAPAROSCOPIC BILATERAL INGUINAL DIRECT AND INDIRECT, FEMORAL,  AND OBTURATOR HERNIAS;  Surgeon: Adin Hector, MD;  Location: [place];  Service: General;  Laterality: Bilateral;  . Insertion of mesh N/A 01/16/2013    Procedure: INSERTION OF MESH;  Surgeon: Adin Hector, MD;  Location: [place];  Service: General;  Laterality: N/A;  . Breast cyst excision Left 01/16/2013    Procedure: MASS EXCISION AXILLA;  Surgeon: Adin Hector, MD;  Location: [place];  Service: General;  Laterality: Left;   Social History:   reports that he quit smoking about 34 years ago. His smoking use included Pipe and Cigars. He has never used smokeless tobacco. He reports that he drinks about 4.2 ounces of alcohol per week. He reports that he does not use illicit drugs.  Family History  Problem Relation Age of Onset  . Stroke Mother 48  . Hypertension Mother   . Diabetes Mother   . Rectal cancer Father 48  . Heart disease Father     in 54s  . Cancer Father     colorectal  . Nephritis Sister   . Seizures Brother     died as child    Medications: Patient's Medications  New Prescriptions   No medications on file  Previous Medications   ACETAMINOPHEN (TYLENOL) 325 MG TABLET    Take 650 mg by mouth every 6 (six) hours as needed. 1000mg  tid.   ASPIRIN 81 MG TABLET    Take 81 mg by mouth daily.   ATORVASTATIN (LIPITOR) 20 MG TABLET    Take 20 mg by mouth daily.   B COMPLEX VITAMINS CAPSULE    Take 1 capsule by mouth daily.   BIMATOPROST (LUMIGAN) 0.01 % SOLN    Place 1 drop into both eyes at bedtime.   BUPROPION (WELLBUTRIN XL) 300 MG 24 HR TABLET    Take 300 mg by mouth daily.   CHOLECALCIFEROL (VITAMIN D3) 2000 UNITS TABS    Take 4,000 Units by mouth daily.    CLONAZEPAM (KLONOPIN) 0.5 MG TABLET    Take 0.5 mg by mouth 3 (three) times daily as needed for anxiety.    FLAXSEED, LINSEED, (FLAX SEEDS PO)    Take 1 tablet by mouth daily.    KRILL OIL 500 MG CAPS    Take 500 mg by mouth daily.    LEVOTHYROXINE (SYNTHROID, LEVOTHROID) 50 MCG TABLET    Take 25-50 mcg by mouth See  admin instructions. Take 1/2 tab (78mcg) on Sundays, 1 tab (72mcg) on all other days.   LINACLOTIDE (LINZESS) 290 MCG CAPS CAPSULE    Take 1 capsule (290 mcg total) by mouth daily.   MIRTAZAPINE (REMERON) 45 MG TABLET    Take 45 mg by mouth at bedtime.   MUPIROCIN OINTMENT (BACTROBAN) 2 %    Applied twice a day to  the affected area;NOT into eyes.   NITROFURANTOIN (MACRODANTIN) 100 MG CAPSULE    Take 100 mg by mouth 4 (four) times daily.   OXYCODONE (OXY IR/ROXICODONE) 5 MG IMMEDIATE RELEASE TABLET    Take 1 tablet (5 mg total) by mouth every 6 (six) hours as needed for moderate pain or severe pain.   POLYETHYLENE GLYCOL (MIRALAX / GLYCOLAX) PACKET    Take 17 g by mouth 2 (two) times daily. 3 capfuls daily   PROBIOTIC PRODUCT (PROBIOTIC DAILY PO)    Take by mouth.   SERTRALINE (ZOLOFT) 100 MG TABLET    Take 200 mg by mouth daily.    TAMSULOSIN (FLOMAX) 0.4 MG CAPS CAPSULE    Take 2 capsules (0.8 mg total) by mouth daily.   TESTOSTERONE ([name]) 50 MG/5GM GEL    Place 5 g onto the skin daily.   TOPIRAMATE (TOPAMAX) 25 MG TABLET    Take 25-50 mg by mouth 2 (two) times daily. 1 tablet (25mg ) in the morning and 2 tablets (50mg ) in the evening.   VITAMIN B-12 (CYANOCOBALAMIN) 1000 MCG TABLET    Take 1,000 mcg by mouth daily.  Modified Medications   No medications on file  Discontinued Medications   MULTIPLE VITAMINS-MINERALS (ICAPS PO)    Take 1 capsule by mouth 2 (two) times daily.      Physical Exam: Physical Exam  Constitutional: He is oriented to person, place, and time. He appears well-developed and well-nourished. No distress.  HENT:  Head: Normocephalic and atraumatic.  Right Ear: External ear normal.  Left Ear: External ear normal.  Nose: Nose normal.  Mouth/Throat: Oropharynx is clear and moist. No oropharyngeal exudate.  Eyes: Conjunctivae and EOM are normal. Pupils are equal, round, and reactive to light. Right eye exhibits no discharge. Left eye exhibits no discharge. No scleral  icterus.  Neck: Normal range of motion. Neck supple. No JVD present. No tracheal deviation present. No thyromegaly present.  Cardiovascular: Normal rate, regular rhythm, normal heart sounds and intact distal pulses.   No murmur heard. Pulmonary/Chest: Effort normal and breath sounds normal. No stridor. No respiratory distress. He has no wheezes. He has no rales. He exhibits no tenderness.  Abdominal: Soft. Bowel sounds are normal. He exhibits no distension. There is no tenderness. There is no rebound and no guarding.  Musculoskeletal: Normal range of motion. He exhibits no edema and no tenderness.  Lymphadenopathy:    He has no cervical adenopathy.  Neurological: He is alert and oriented to person, place, and time. He has normal reflexes. No cranial nerve deficit. He exhibits normal muscle tone. Coordination normal.  Skin: Skin is warm and dry. No rash noted. He is not diaphoretic. No erythema. No pallor.  Psychiatric: His mood appears anxious. His affect is not angry, not blunt, not labile and not inappropriate. His speech is not rapid and/or pressured, not delayed, not tangential and not slurred. He is not agitated, not aggressive, not hyperactive, not slowed, not withdrawn, not actively hallucinating and not combative. Thought content is not paranoid and not delusional. Cognition and memory are impaired. He does not express impulsivity or inappropriate judgment. He exhibits a depressed mood. He expresses no homicidal and no suicidal ideation. He expresses no suicidal plans and no homicidal plans. He is communicative. He exhibits abnormal recent memory. He exhibits normal remote memory. He is attentive.    Filed Vitals:   02/16/13 1553  BP: 164/82  Pulse: 56  Temp: 98.4 F (36.9 C)  TempSrc: Tympanic  Resp: 16  Labs reviewed: Basic Metabolic Panel:  Recent Labs  07/30/12 2118 07/31/12 0203  09/08/12 1114  01/26/13 1900 01/26/13 2210 01/27/13 0500 01/28/13 0525  NA 137  --    < >  --   < > 138  --  139 143  K 4.5  --   < >  --   < > 3.9  --  4.0 3.8  CL 105  --   < >  --   < > 106  --  109 117*  CO2 27  --   < >  --   < > 20  --  19 21  GLUCOSE 99  --   < >  --   < > 112*  --  148* 138*  BUN 34*  --   < >  --   < > 75*  --  69* 46*  CREATININE 1.40* 1.30  < >  --   < > 2.63*  --  2.85* 1.68*  CALCIUM 9.0  --   < >  --   < > 8.9  --  8.0* 7.7*  MG  --  2.1  --   --   --   --   --   --   --   TSH  --  1.436  --  2.59  --   --  2.756  --   --   < > = values in this interval not displayed. Liver Function Tests:  Recent Labs  07/30/12 2118 09/08/12 1114 01/26/13 1900  AST 15 15 16   ALT 12 13 14   ALKPHOS 36* 39 49  BILITOT 0.2* 0.9 0.7  PROT 5.9* 6.3 6.1  ALBUMIN 3.5 3.9 2.7*    Recent Labs  01/26/13 1900  LIPASE 15  CBC:  Recent Labs  07/30/12 2118  07/31/12 0455 01/09/13 1200 01/26/13 1855  WBC 5.0  < > 5.1 6.2 6.6  NEUTROABS 2.8  --   --   --  4.9  HGB 15.2  < > 14.4 14.1 11.4*  HCT 45.0  < > 43.0 42.6 35.2*  MCV 92.4  < > 92.1 93.4 91.2  PLT 124*  < > 109* 130* 196  < > = values in this interval not displayed.  Recent Labs  08/02/12 0425  VITAMINB12 859    Past Procedures:  08/02/12 2D echocardiogram:  LV EF: 55% -   60%  07/30/12 CT head  IMPRESSION: 1.  No acute intracranial abnormalities. 2.  Mild cerebral and cerebellar atrophy with chronic microvascular ischemic changes of the cerebral white matter, similar to the prior Study.  08/01/12 MRI brain:  IMPRESSION: Motion degraded exam.   No acute infarct.   Moderate small vessel disease type changes.   Global atrophy.     Assessment/Plan HYPOTHYROIDISM Takes Levothyroxine 52mcg daily except Sunday 87mcg. Last TSH wnl while in hospital.         Urinary tract infection, site not specified 02/10/13 E Coli and K. Pneumoniae--Macrodantin 100mg  bid for 7 days started 02/10/13-asymptomatic upon my visit today.     Chronic constipation Managed with  laxatives. Miralax bid. Continue Lizness.       ANXIETY Apparently stable on Zoloft 200mg , Wellbutrin 300mg , and Remeron 45mg . F/u Dr. Neurology Jannifer Franklin 04/18/13      MIGRAINE HEADACHE Controlled with Topmax.     ESOPHAGITIS Stable    HTN (hypertension) Controlled      Acute urinary retention Foley inserted, Flomax increased, f/u Urology as out patient  scheduled.       Family/ Staff Communication: discharge home with in home PT/OT  Goals of Care: IL  Labs/tests ordered: none

## 2013-02-16 NOTE — Assessment & Plan Note (Signed)
Stable

## 2013-02-26 DIAGNOSIS — N312 Flaccid neuropathic bladder, not elsewhere classified: Secondary | ICD-10-CM

## 2013-02-26 HISTORY — DX: Flaccid neuropathic bladder, not elsewhere classified: N31.2

## 2013-03-01 ENCOUNTER — Encounter (INDEPENDENT_AMBULATORY_CARE_PROVIDER_SITE_OTHER): Payer: Self-pay

## 2013-03-04 ENCOUNTER — Emergency Department (HOSPITAL_COMMUNITY)
Admission: EM | Admit: 2013-03-04 | Discharge: 2013-03-04 | Discharge status: Against Medical Advice | Payer: Medicare Other

## 2013-03-04 ENCOUNTER — Encounter (HOSPITAL_COMMUNITY): Payer: Self-pay | Admitting: Emergency Medicine

## 2013-03-04 ENCOUNTER — Emergency Department (HOSPITAL_COMMUNITY)
Admission: EM | Admit: 2013-03-04 | Discharge: 2013-03-04 | Disposition: A | Discharge status: Home / Self Care | Payer: Medicare Other | Attending: Emergency Medicine | Admitting: Emergency Medicine

## 2013-03-04 DIAGNOSIS — F3289 Other specified depressive episodes: Secondary | ICD-10-CM | POA: Insufficient documentation

## 2013-03-04 DIAGNOSIS — Z87891 Personal history of nicotine dependence: Secondary | ICD-10-CM | POA: Insufficient documentation

## 2013-03-04 DIAGNOSIS — Z8739 Personal history of other diseases of the musculoskeletal system and connective tissue: Secondary | ICD-10-CM | POA: Insufficient documentation

## 2013-03-04 DIAGNOSIS — Y846 Urinary catheterization as the cause of abnormal reaction of the patient, or of later complication, without mention of misadventure at the time of the procedure: Secondary | ICD-10-CM | POA: Insufficient documentation

## 2013-03-04 DIAGNOSIS — F329 Major depressive disorder, single episode, unspecified: Secondary | ICD-10-CM | POA: Insufficient documentation

## 2013-03-04 DIAGNOSIS — Z8719 Personal history of other diseases of the digestive system: Secondary | ICD-10-CM | POA: Insufficient documentation

## 2013-03-04 DIAGNOSIS — Z862 Personal history of diseases of the blood and blood-forming organs and certain disorders involving the immune mechanism: Secondary | ICD-10-CM | POA: Insufficient documentation

## 2013-03-04 DIAGNOSIS — Z8669 Personal history of other diseases of the nervous system and sense organs: Secondary | ICD-10-CM | POA: Insufficient documentation

## 2013-03-04 DIAGNOSIS — Z8619 Personal history of other infectious and parasitic diseases: Secondary | ICD-10-CM | POA: Insufficient documentation

## 2013-03-04 DIAGNOSIS — Z792 Long term (current) use of antibiotics: Secondary | ICD-10-CM | POA: Insufficient documentation

## 2013-03-04 DIAGNOSIS — Z91040 Latex allergy status: Secondary | ICD-10-CM | POA: Insufficient documentation

## 2013-03-04 DIAGNOSIS — E785 Hyperlipidemia, unspecified: Secondary | ICD-10-CM | POA: Insufficient documentation

## 2013-03-04 DIAGNOSIS — G43909 Migraine, unspecified, not intractable, without status migrainosus: Secondary | ICD-10-CM | POA: Insufficient documentation

## 2013-03-04 DIAGNOSIS — E039 Hypothyroidism, unspecified: Secondary | ICD-10-CM | POA: Insufficient documentation

## 2013-03-04 DIAGNOSIS — Z7982 Long term (current) use of aspirin: Secondary | ICD-10-CM | POA: Insufficient documentation

## 2013-03-04 DIAGNOSIS — Z79899 Other long term (current) drug therapy: Secondary | ICD-10-CM | POA: Insufficient documentation

## 2013-03-04 DIAGNOSIS — N390 Urinary tract infection, site not specified: Secondary | ICD-10-CM | POA: Insufficient documentation

## 2013-03-04 DIAGNOSIS — T83091A Other mechanical complication of indwelling urethral catheter, initial encounter: Secondary | ICD-10-CM

## 2013-03-04 LAB — URINALYSIS, ROUTINE W REFLEX MICROSCOPIC
BILIRUBIN URINE: NEGATIVE
GLUCOSE, UA: NEGATIVE mg/dL
Ketones, ur: NEGATIVE mg/dL
Nitrite: NEGATIVE
Specific Gravity, Urine: 1.021 (ref 1.005–1.030)
Urobilinogen, UA: 0.2 mg/dL (ref 0.0–1.0)
pH: 8.5 — ABNORMAL HIGH (ref 5.0–8.0)

## 2013-03-04 LAB — URINE MICROSCOPIC-ADD ON

## 2013-03-04 MED ORDER — LEVOFLOXACIN 500 MG PO TABS
750.0000 mg | ORAL_TABLET | Freq: Once | ORAL | Status: AC
  Administered 2013-03-04: 750 mg via ORAL
  Filled 2013-03-04: qty 2

## 2013-03-04 MED ORDER — LEVOFLOXACIN 750 MG PO TABS: 750.0000 mg | ORAL_TABLET | Freq: Once | ORAL | Status: DC

## 2013-03-04 NOTE — ED Notes (Signed)
Patient is alert and oriented x3.  EMS called to home.  Found patient with complaints of urine retention  That started yesterday at noon.  Patient has a foley in place due to bladder issues.  Currently he rates  His pain 3 of 10.

## 2013-03-04 NOTE — Discharge Instructions (Signed)
Take antibiotic as prescribed.  Follow up with your urologist. You should return to the ER if you develop fever, severe abdomen or low back pain or uncontrolled vomiting.

## 2013-03-04 NOTE — ED Notes (Signed)
Patient is alert and oriented x3.  He was given DC instructions and follow up visit instructions.  Patient gave verbal understanding.  He was DC ambulatory VIA [facility] on stretcher to home.  V/S stable.  He was not showing any signs of distress on DC

## 2013-03-04 NOTE — ED Provider Notes (Signed)
CSN: [id number]     Arrival date & time 03/04/13  0113 History   First MD Initiated Contact with Patient 03/04/13 0135     Chief Complaint  Patient presents with  . Urinary Retention   (Consider location/radiation/quality/duration/timing/severity/associated sxs/prior Treatment) HPI History provided by pt.   Pt has a chronic foley d/t poor bladder function.  Has had his current foley for 1-2 weeks and it has been obstructed since last night.  Associated w/ mild, constant suprapubic pain only.  Has not taken anything for pain.   Past Medical History  Diagnosis Date  . Migraines     Dr Jannifer Franklin  . Peripheral neuropathy   . Benign essential tremor   . Cervical stenosis of spine   . Anemia     B12 deficiency  . Testosterone deficiency     Dr Lawerance Bach, [facility]  . Hypothyroidism   . Hyperlipidemia   . Depression     Dr Albertine Patricia  . History of shingles 2009  . H/O cardiovascular stress test     a. 02/2003 -> no ischemia/infarct.  Marland Kitchen Hx of echocardiogram     Echocardiogram 08/01/12: Mild LVH, EF 55-60%, normal wall motion.  . Sinus bradycardia   . PAD (peripheral artery disease)   . Carotid stenosis     s/p L CEA  . Fecal impaction 10/11/2012   Past Surgical History  Procedure Laterality Date  . Appendectomy  1941  . Lumbar laminectomy    . Carotid endarterectomy       L  . Tonsillectomy and adenoidectomy    . Total hip arthroplasty      L  . Cataract extraction Right   . Eye surgery Right 02/2012    retina  . Inguinal hernia repair Bilateral 01/16/2013    Procedure: LAPAROSCOPIC BILATERAL INGUINAL DIRECT AND INDIRECT, FEMORAL, AND OBTURATOR HERNIAS;  Surgeon: Adin Hector, MD;  Location: [place];  Service: General;  Laterality: Bilateral;  . Insertion of mesh N/A 01/16/2013    Procedure: INSERTION OF MESH;  Surgeon: Adin Hector, MD;  Location: [place];  Service: General;  Laterality: N/A;  . Breast cyst excision Left 01/16/2013    Procedure: MASS EXCISION AXILLA;  Surgeon: Adin Hector, MD;  Location: [facility];  Service: General;  Laterality: Left;   Family History  Problem Relation Age of Onset  . Stroke Mother 41  . Hypertension Mother   . Diabetes Mother   . Rectal cancer Father 44  . Heart disease Father     in 62s  . Cancer Father     colorectal  . Nephritis Sister   . Seizures Brother     died as child   History  Substance Use Topics  . Smoking status: Former Smoker -- 10 years    Types: Pipe, Cigars    Quit date: 02/16/1979  . Smokeless tobacco: Never Used     Comment: Quit about age 58   . Alcohol Use: 4.2 oz/week    7 Glasses of wine per week     Comment:      Review of Systems  All other systems reviewed and are negative.    Allergies  Latex and Tape  Home Medications   Current Outpatient Rx  Name  Route  Sig  Dispense  Refill  . aspirin 81 MG tablet   Oral   Take 81 mg by mouth every morning.          Marland Kitchen atorvastatin (LIPITOR) 20 MG  tablet   Oral   Take 20 mg by mouth every evening.          . B Complex-C (B-COMPLEX WITH VITAMIN C) tablet   Oral   Take 1 tablet by mouth every morning.         . bimatoprost (LUMIGAN) 0.01 % SOLN   Both Eyes   Place 1 drop into both eyes at bedtime.         . brimonidine (ALPHAGAN) 0.15 % ophthalmic solution   Right Eye   Place 1 drop into the right eye 2 (two) times daily.         Marland Kitchen buPROPion (WELLBUTRIN XL) 300 MG 24 hr tablet   Oral   Take 300 mg by mouth every morning.          . cholecalciferol (VITAMIN D) 1000 UNITS tablet   Oral   Take 4,000 Units by mouth every morning.         . clonazePAM (KLONOPIN) 0.5 MG tablet   Oral   Take 0.5 mg by mouth 3 (three) times daily as needed for anxiety.          . Flaxseed, Linseed, (FLAX SEEDS PO)   Oral   Take 1 tablet by mouth every morning.          Javier Docker Oil 500 MG CAPS   Oral   Take 500 mg by mouth every morning.          Marland Kitchen levothyroxine (SYNTHROID, LEVOTHROID) 50 MCG tablet   Oral   Take 25-50 mcg  by mouth every morning. Take 1/2 tab (75mcg) on Sundays, 1 tab (33mcg) on all other days.         . Linaclotide (LINZESS) 290 MCG CAPS capsule   Oral   Take 1 capsule (290 mcg total) by mouth daily.   20 capsule   0     Samples given to patient    Lot#   [id number]         ...   . mirtazapine (REMERON) 45 MG tablet   Oral   Take 45 mg by mouth at bedtime.         . Multiple Vitamins-Minerals (ICAPS AREDS FORMULA PO)   Oral   Take 1 capsule by mouth 2 (two) times daily.         . sertraline (ZOLOFT) 100 MG tablet   Oral   Take 200 mg by mouth every evening.          . tamsulosin (FLOMAX) 0.4 MG CAPS capsule   Oral   Take 0.4 mg by mouth daily after supper.         . testosterone ([name]) 50 MG/5GM GEL   Transdermal   Place 5 g onto the skin every morning.          . topiramate (TOPAMAX) 25 MG tablet   Oral   Take 25-50 mg by mouth 2 (two) times daily. 1 tablet (25mg ) in the morning and 2 tablets (50mg ) in the evening.         . vitamin B-12 (CYANOCOBALAMIN) 1000 MCG tablet   Oral   Take 1,000 mcg by mouth every morning.          . nitrofurantoin (MACRODANTIN) 100 MG capsule   Oral   Take 100 mg by mouth 2 (two) times daily.           BP 159/74  Pulse 59  Temp(Src) 98.3 F (36.8 C) (Oral)  Resp  18  Ht 6\' 1"  (1.854 m)  Wt 150 lb (68.04 kg)  BMI 19.79 kg/m2  SpO2 97% Physical Exam  Nursing note and vitals reviewed. Constitutional: He is oriented to person, place, and time. He appears well-developed and well-nourished. No distress.  HENT:  Head: Normocephalic and atraumatic.  Eyes:  Normal appearance  Neck: Normal range of motion.  Cardiovascular: Normal rate and regular rhythm.   Pulmonary/Chest: Effort normal and breath sounds normal. No respiratory distress.  Abdominal: Soft. Bowel sounds are normal. He exhibits no distension and no mass. There is no tenderness. There is no rebound and no guarding.  Genitourinary:  Foley in place.  Small  amt of blood at urinary meatus  Musculoskeletal: Normal range of motion.  Neurological: He is alert and oriented to person, place, and time.  Skin: Skin is warm and dry. No rash noted.  Psychiatric: He has a normal mood and affect. His behavior is normal.    ED Course  Procedures (including critical care time) Labs Review Labs Reviewed  URINALYSIS, ROUTINE W REFLEX MICROSCOPIC - Abnormal; Notable for the following:    APPearance TURBID (*)    pH 8.5 (*)    Hgb urine dipstick LARGE (*)    Protein, ur >300 (*)    Leukocytes, UA LARGE (*)    All other components within normal limits  URINE MICROSCOPIC-ADD ON - Abnormal; Notable for the following:    Crystals TRIPLE PHOSPHATE CRYSTALS (*)    All other components within normal limits  URINE CULTURE   Imaging Review No results found.  EKG Interpretation   None       MDM   1. Obstruction of Foley catheter   2. UTI (lower urinary tract infection)    80yo M w/ chronic foley presents w/ foley obstruction w/ associated suprapubic pain.  Irrigation of foley attempted by nursing staff w/out success and then new foley inserted with drainage.  Pain is improved.  U/A obtained to r/o UTI and is pending.  2:33 AM   U/A positive for infection.  Pt notified.  He received first dose of levaquin.  I recommended f/u with his urologist.  Return precautions discussed. 3:26 AM   Remer Macho, PA-C 03/04/13 [phone]

## 2013-03-04 NOTE — ED Provider Notes (Signed)
Medical screening examination/treatment/procedure(s) were conducted as a shared visit with non-physician practitioner(s) or resident and myself. I personally evaluated the patient during the encounter and agree with the findings and plan unless otherwise indicated.  I have personally reviewed any xrays and/ or EKG's with the provider and I agree with interpretation.  Chronic foley, decreased bladder fx, followed at [facility] presents with urinary retention and suprapubic fullness. No fevers or vomiting, feels well otherwise. No current abx. Exam abd soft/ NT. Foley exchanged, sxs improved but still mild fullness. Bedside US showed empty bladder. Urine infected appearance, UA sent. Infection, levaquin po and fup urologist.   Limited Ultrasound of bladder  Performed by Dr. Reather Converse  Indication: to assess for urinary retention and/or bladder volume prior to urinary catheter  Technique: Low frequency probe utilized in two planes to assess bladder volume in real-time.  Findings: Bladder volume minimal  Additional findings: foley in place  Images were archived electronically  Foley issue, Urinary retention       Mariea Clonts, MD 03/04/13 [phone]

## 2013-03-04 NOTE — ED Notes (Signed)
Bed: ZT24 Expected date:  Expected time:  Means of arrival:  Comments: EMS 80yo M bladder pain, cath difficulties

## 2013-03-06 LAB — URINE CULTURE

## 2013-03-07 NOTE — ED Notes (Addendum)
+   Urine Patient treated with Levaquin ok per Eduard Roux

## 2013-03-14 ENCOUNTER — Encounter (INDEPENDENT_AMBULATORY_CARE_PROVIDER_SITE_OTHER): Payer: Self-pay | Admitting: Surgery

## 2013-03-14 ENCOUNTER — Ambulatory Visit (INDEPENDENT_AMBULATORY_CARE_PROVIDER_SITE_OTHER): Payer: Medicare Other | Admitting: Surgery

## 2013-03-14 VITALS — BP 142/72 | HR 60 | Temp 97.9°F | Resp 14 | Ht 73.0 in | Wt 150.0 lb

## 2013-03-14 DIAGNOSIS — K4021 Bilateral inguinal hernia, without obstruction or gangrene, recurrent: Secondary | ICD-10-CM

## 2013-03-14 DIAGNOSIS — K4120 Bilateral femoral hernia, without obstruction or gangrene, not specified as recurrent: Secondary | ICD-10-CM

## 2013-03-14 DIAGNOSIS — K458 Other specified abdominal hernia without obstruction or gangrene: Secondary | ICD-10-CM

## 2013-03-14 NOTE — Progress Notes (Signed)
Subjective:     Patient ID: Jose Leach, male   DOB: 03/12/1932, 80 y.o.   MRN: [id number]  HPI   Note: This dictation was prepared with Dragon/digital dictation along with [facility] technology. Any transcriptional errors that result from this process are unintentional.       CALVIN JABLONOWSKI  [birth year] [id number]  Patient Care Team: Hendricks Limes, MD as PCP - General (Internal Medicine) [name], MD as Attending Physician (Urology)  Procedure (Date: 01/16/2013):  POST-OPERATIVE DIAGNOSIS:  BILATERAL INGUINAL HERNIAS  BILATERAL FEMORAL HERNIAS  BILATERAL OBTURATOR HERNIAS  LEFT AXILLARY MASS   PROCEDURE: Procedure(s):  LAPAROSCOPIC BILATERAL INGUINAL DIRECT AND INDIRECT, FEMORAL, AND OBTURATOR HERNIAS  INSERTION OF MESH  MASS EXCISION AXILLA   SURGEON:   Adin Hector, MD   This patient returns for surgical re-evaluation.  He comes today with his wife.  He struggled with urinary retention.  He went into kidney failure.  Required admission.  Foley catheter placed.  Creatinine normalized rapidly.  Has not been able to void normally since.  Seeing Dr. Rosana Hoes with urology to help troubleshoot this.  He is transitioned to intermittent catheterizations.  He is needing to use that less weight.  He and his wife for hopeful that he can get off catheterizations altogether.  The pain is gone.  Bowels moving regularly.  No fevers or chills.  Trying to be more active.  He wants to be able to lift his walker into the car.  His wife is worried that that is not safe.  He denies any falls or lightheadedness or dizziness or balance issues.   Patient Active Problem List   Diagnosis Date Noted  . Urinary tract infection, site not specified 02/13/2013  . Renal cyst, left 01/30/2013  . Acute urinary retention 01/28/2013  . AKI (acute kidney injury) 01/26/2013  . Volume depletion 01/26/2013  . Fecal impaction 01/22/2013  . Mass of left axilla - prob cyst 01/16/2013  .  Femoral hernia, bilateral s/p lap repair 01/16/2013 01/16/2013  . Obturator hernia - bilateral, s/p lap repair 01/16/2013 01/16/2013  . Personal history of colonic polyps 01/05/2013  . Inguinal hernia recurrent bilateral s/p lap repair 01/16/2013 12/06/2012  . Bilateral leg edema 09/13/2012  . HTN (hypertension) 08/22/2012  . Unspecified vitamin D deficiency 08/06/2012  . Weakness 08/02/2012  . Bradycardia, sinus 08/02/2012  . Hyperlipidemia   . Symptomatic sinus bradycardia 07/30/2012  . Chronic constipation 07/24/2012  . Family history of colon cancer 07/24/2012  . Open wound of heel 07/04/2012  . Hx of falling 07/19/2011  . Difficulty in walking(719.7) 04/05/2011  . Balance problems 04/05/2011  . HAMMER TOE 04/23/2010  . ABNORMAL ELECTROCARDIOGRAM 04/16/2009  . MORTON'S NEUROMA, LEFT 08/22/2007  . ANXIETY 06/07/2007  . ARTHRITIS 06/07/2007  . CAROTID ARTERY DISEASE 03/31/2007  . PERIPHERAL VASCULAR DISEASE 03/16/2007  . HYPOTHYROIDISM 11/10/2006  . HYPERLIPIDEMIA 11/10/2006  . MIGRAINE HEADACHE 03/14/2006  . PREMATURE ATRIAL CONTRACTIONS 03/14/2006  . SPINAL STENOSIS 03/14/2006  . ESOPHAGITIS 08/06/2002    Past Medical History  Diagnosis Date  . Migraines     Dr Jannifer Franklin  . Peripheral neuropathy   . Benign essential tremor   . Cervical stenosis of spine   . Anemia     B12 deficiency  . Testosterone deficiency     Dr Lawerance Bach, [facility]  . Hypothyroidism   . Hyperlipidemia   . Depression     Dr Albertine Patricia  . History of shingles 2009  . H/O  cardiovascular stress test     a. 02/2003 -> no ischemia/infarct.  Marland Kitchen Hx of echocardiogram     Echocardiogram 08/01/12: Mild LVH, EF 55-60%, normal wall motion.  . Sinus bradycardia   . PAD (peripheral artery disease)   . Carotid stenosis     s/p L CEA  . Fecal impaction 10/11/2012    Past Surgical History  Procedure Laterality Date  . Appendectomy  1941  . Lumbar laminectomy    . Carotid endarterectomy       L  . Tonsillectomy  and adenoidectomy    . Total hip arthroplasty      L  . Cataract extraction Right   . Eye surgery Right 02/2012    retina  . Inguinal hernia repair Bilateral 01/16/2013    Procedure: LAPAROSCOPIC BILATERAL INGUINAL DIRECT AND INDIRECT, FEMORAL, AND OBTURATOR HERNIAS;  Surgeon: Adin Hector, MD;  Location: [place];  Service: General;  Laterality: Bilateral;  . Insertion of mesh N/A 01/16/2013    Procedure: INSERTION OF MESH;  Surgeon: Adin Hector, MD;  Location: [place];  Service: General;  Laterality: N/A;  . Breast cyst excision Left 01/16/2013    Procedure: MASS EXCISION AXILLA;  Surgeon: Adin Hector, MD;  Location: [place];  Service: General;  Laterality: Left;    History   Social History  . Marital Status: Married    Spouse Name: [name]    Number of Children: 2  . Years of Education: N/A   Occupational History  . retired    Social History Main Topics  . Smoking status: Former Smoker -- 10 years    Types: Pipe, Cigars    Quit date: 02/16/1979  . Smokeless tobacco: Never Used     Comment: Quit about age 87   . Alcohol Use: 4.2 oz/week    7 Glasses of wine per week     Comment:    . Drug Use: No  . Sexual Activity: No   Other Topics Concern  . Not on file   Social History Narrative  . No narrative on file    Family History  Problem Relation Age of Onset  . Stroke Mother 63  . Hypertension Mother   . Diabetes Mother   . Rectal cancer Father 52  . Heart disease Father     in 71s  . Cancer Father     colorectal  . Nephritis Sister   . Seizures Brother     died as child    Current Outpatient Prescriptions  Medication Sig Dispense Refill  . aspirin 81 MG tablet Take 81 mg by mouth every morning.       Marland Kitchen atorvastatin (LIPITOR) 20 MG tablet Take 20 mg by mouth every evening.       . B Complex-C (B-COMPLEX WITH VITAMIN C) tablet Take 1 tablet by mouth every morning.      . bimatoprost (LUMIGAN) 0.01 % SOLN Place 1 drop into both eyes at bedtime.      .  brimonidine (ALPHAGAN) 0.15 % ophthalmic solution Place 1 drop into the right eye 2 (two) times daily.      Marland Kitchen buPROPion (WELLBUTRIN XL) 300 MG 24 hr tablet Take 300 mg by mouth every morning.       . cholecalciferol (VITAMIN D) 1000 UNITS tablet Take 4,000 Units by mouth every morning.      . clonazePAM (KLONOPIN) 0.5 MG tablet Take 0.5 mg by mouth 3 (three) times daily as needed for anxiety.       Marland Kitchen  Flaxseed, Linseed, (FLAX SEEDS PO) Take 1 tablet by mouth every morning.       Javier Docker Oil 500 MG CAPS Take 500 mg by mouth every morning.       Marland Kitchen levofloxacin (LEVAQUIN) 750 MG tablet Take 1 tablet (750 mg total) by mouth once.  9 tablet  0  . levothyroxine (SYNTHROID, LEVOTHROID) 50 MCG tablet Take 25-50 mcg by mouth every morning. Take 1/2 tab (52mcg) on Sundays, 1 tab (45mcg) on all other days.      . Linaclotide (LINZESS) 290 MCG CAPS capsule Take 1 capsule (290 mcg total) by mouth daily.  20 capsule  0  . mirtazapine (REMERON) 45 MG tablet Take 45 mg by mouth at bedtime.      . Multiple Vitamins-Minerals (ICAPS AREDS FORMULA PO) Take 1 capsule by mouth 2 (two) times daily.      . nitrofurantoin (MACRODANTIN) 100 MG capsule Take 100 mg by mouth 2 (two) times daily.       . sertraline (ZOLOFT) 100 MG tablet Take 200 mg by mouth every evening.       . tamsulosin (FLOMAX) 0.4 MG CAPS capsule Take 0.4 mg by mouth daily after supper.      . testosterone ([name]) 50 MG/5GM GEL Place 5 g onto the skin every morning.       . topiramate (TOPAMAX) 25 MG tablet Take 25-50 mg by mouth 2 (two) times daily. 1 tablet (25mg ) in the morning and 2 tablets (50mg ) in the evening.      . vitamin B-12 (CYANOCOBALAMIN) 1000 MCG tablet Take 1,000 mcg by mouth every morning.        No current facility-administered medications for this visit.     Allergies  Allergen Reactions  . Latex Rash  . Tape Rash    Paper tape is ok to use     BP 142/72  Pulse 60  Temp(Src) 97.9 F (36.6 C) (Temporal)  Resp 14  Ht 6'  1" (1.854 m)  Wt 150 lb (68.04 kg)  BMI 19.79 kg/m2  US Renal  01/27/2013   CLINICAL DATA:  Elevated creatinine  EXAM: RENAL/URINARY TRACT ULTRASOUND COMPLETE  COMPARISON:  None.  FINDINGS: Right Kidney:  Length: 10.5 cm. 7 mm lower pole cyst. Mild to moderate hydronephrosis.  Left Kidney:  Length: 13.1 cm. 7.6 x 6.7 x 7.0 cm mildly complex upper pole renal cyst with layering peripheral debris. Mild to moderate hydronephrosis.  Bladder:  Distended bladder with large postvoid residual.  IMPRESSION: Distended bladder with large postvoid residual.  Mild to moderate bilateral hydronephrosis, possibly related to bladder outlet obstruction.  7.6 cm mildly complex left upper pole renal cyst with layering debris.   Electronically Signed   By: Julian Hy M.D.   On: 01/27/2013 15:45   Dg Abd Acute W/chest  01/26/2013   CLINICAL DATA:  Abdominal pain. Weakness. Postop from hernia repair 10 days ago.  EXAM: ACUTE ABDOMEN SERIES (ABDOMEN 2 VIEW & CHEST 1 VIEW)  COMPARISON:  Chest radiograph on 07/30/2012  FINDINGS: Mild gaseous distention of small bowel and colon noted, consistent with mild postop ileus. No radiopaque calculi or other significant radiographic abnormality is seen. Left hip prosthesis noted.  Heart size and mediastinal contours are within normal limits. Both lungs are clear.  IMPRESSION: Mild postop ileus pattern.  No active cardiopulmonary disease.   Electronically Signed   By: Earle Gell M.D.   On: 01/26/2013 19:54     Review of Systems  Objective:   Physical Exam  Constitutional: He is oriented to person, place, and time. He appears well-developed and well-nourished. No distress.  HENT:  Head: Normocephalic.  Mouth/Throat: Oropharynx is clear and moist. No oropharyngeal exudate.  Eyes: Conjunctivae and EOM are normal. Pupils are equal, round, and reactive to light. No scleral icterus.  Neck: Normal range of motion. No tracheal deviation present.  Cardiovascular: Normal  rate, normal heart sounds and intact distal pulses.   Pulmonary/Chest: Effort normal. No respiratory distress.  Abdominal: Soft. He exhibits no distension. There is no tenderness. No hernia. Hernia confirmed negative in the right inguinal area and confirmed negative in the left inguinal area.  Incisions clean with normal healing ridges.  No hernias  Musculoskeletal: Normal range of motion. He exhibits no tenderness.  Neurological: He is alert and oriented to person, place, and time. No cranial nerve deficit or sensory deficit. He exhibits normal muscle tone. Coordination and gait normal. GCS eye subscore is 4. GCS verbal subscore is 5. GCS motor subscore is 6.  Skin: Skin is warm and dry. No rash noted. He is not diaphoretic.  Psychiatric: He has a normal mood and affect. His behavior is normal. Judgment and thought content normal. His speech is delayed. Cognition and memory are normal.  Slow to speak.       Assessment:     Status post repair of numerous pelvic hernias laparoscopically complicated by worsening constipation and urinary retention and deconditioning.  Recovering better    Plan:     I am glad that he is getting better.  The patient and especially his wife are most hopeful he will continue to recover.  Still somewhat fragile and deconditioned, but making strides. Hopefully he will continue to improve.    Regular activity.  Low impact exercise such as walking an hour a day at least ideal.  Do not push through pain.  Okay to work with physical therapy as tolerated.    Diet as tolerated.  Low fat high fiber diet ideal.  Bowel regimen with 30 g fiber a day and fiber supplement as needed to avoid problems.  Then stressed to them that he needs to stay on some fiber supplement every day the rest of his life.  Double the flax seed until he is moving his bowels more regularly.  Defer his IBS issues with gastroenterology.  Troubleshoot urinary issues with urology/Dr. Rosana Hoes.  Hopefully they  can find a way to make it better.  The surgery should not have affected this in the long-term.  Return to clinic as needed.   Instructions discussed.  Followup with primary care physician for other health issues as would normally be done.  Questions answered.  The patient expressed understanding and appreciation

## 2013-03-14 NOTE — Patient Instructions (Signed)
Exercise to Stay Healthy Exercise helps you become and stay healthy. EXERCISE IDEAS AND TIPS Choose exercises that:  You enjoy.  Fit into your day. You do not need to exercise really hard to be healthy. You can do exercises at a slow or medium level and stay healthy. You can:  Stretch before and after working out.  Try yoga, Pilates, or tai chi.  Lift weights.  Walk fast, swim, jog, run, climb stairs, bicycle, dance, or rollerskate.  Take aerobic classes. Exercises that burn about 150 calories:  Running 1  miles in 15 minutes.  Playing volleyball for 45 to 60 minutes.  Washing and waxing a car for 45 to 60 minutes.  Playing touch football for 45 minutes.  Walking 1  miles in 35 minutes.  Pushing a stroller 1  miles in 30 minutes.  Playing basketball for 30 minutes.  Raking leaves for 30 minutes.  Bicycling 5 miles in 30 minutes.  Walking 2 miles in 30 minutes.  Dancing for 30 minutes.  Shoveling snow for 15 minutes.  Swimming laps for 20 minutes.  Walking up stairs for 15 minutes.  Bicycling 4 miles in 15 minutes.  Gardening for 30 to 45 minutes.  Jumping rope for 15 minutes.  Washing windows or floors for 45 to 60 minutes. Document Released: 03/06/2010 Document Revised: 04/26/2011 Document Reviewed: 03/06/2010 [facility] Patient Information 2014 [place], [place].  Vertigo Vertigo means you feel like you are moving when you are not. Vertigo can make you feel like things around you are moving when they are not. This problem often goes away on its own.  HOME CARE   Follow your doctor's instructions.  Avoid driving.  Avoid using heavy machinery.  Avoid doing any activity that could be dangerous if you have a vertigo attack.  Tell your doctor if a medicine seems to cause your vertigo. GET HELP RIGHT AWAY IF:   Your medicines do not help or make you feel worse.  You have trouble talking or walking.  You feel weak or have trouble using your  arms, hands, or legs.  You have bad headaches.  You keep feeling sick to your stomach (nauseous) or throwing up (vomiting).  Your vision changes.  A family member notices changes in your behavior.  Your problems get worse. MAKE SURE YOU:  Understand these instructions.  Will watch your condition.  Will get help right away if you are not doing well or get worse. Document Released: 11/11/2007 Document Revised: 04/26/2011 Document Reviewed: 08/20/2010 [facility] Patient Information 2014 [place].  Exercise to Stay Healthy Exercise helps you become and stay healthy. EXERCISE IDEAS AND TIPS Choose exercises that:  You enjoy.  Fit into your day. You do not need to exercise really hard to be healthy. You can do exercises at a slow or medium level and stay healthy. You can:  Stretch before and after working out.  Try yoga, Pilates, or tai chi.  Lift weights.  Walk fast, swim, jog, run, climb stairs, bicycle, dance, or rollerskate.  Take aerobic classes. Exercises that burn about 150 calories:  Running 1  miles in 15 minutes.  Playing volleyball for 45 to 60 minutes.  Washing and waxing a car for 45 to 60 minutes.  Playing touch football for 45 minutes.  Walking 1  miles in 35 minutes.  Pushing a stroller 1  miles in 30 minutes.  Playing basketball for 30 minutes.  Raking leaves for 30 minutes.  Bicycling 5 miles in 30 minutes.  Walking  2 miles in 30 minutes.  Dancing for 30 minutes.  Shoveling snow for 15 minutes.  Swimming laps for 20 minutes.  Walking up stairs for 15 minutes.  Bicycling 4 miles in 15 minutes.  Gardening for 30 to 45 minutes.  Jumping rope for 15 minutes.  Washing windows or floors for 45 to 60 minutes. Document Released: 03/06/2010 Document Revised: 04/26/2011 Document Reviewed: 03/06/2010 [facility] Patient Information 2014 [place], [place].

## 2013-04-18 ENCOUNTER — Ambulatory Visit (INDEPENDENT_AMBULATORY_CARE_PROVIDER_SITE_OTHER): Payer: Medicare Other | Admitting: Neurology

## 2013-04-18 ENCOUNTER — Encounter: Payer: Self-pay | Admitting: Neurology

## 2013-04-18 VITALS — BP 108/60 | HR 56 | Wt 151.0 lb

## 2013-04-18 DIAGNOSIS — G252 Other specified forms of tremor: Secondary | ICD-10-CM

## 2013-04-18 DIAGNOSIS — R262 Difficulty in walking, not elsewhere classified: Secondary | ICD-10-CM

## 2013-04-18 DIAGNOSIS — G250 Essential tremor: Secondary | ICD-10-CM | POA: Insufficient documentation

## 2013-04-18 HISTORY — DX: Essential tremor: G25.2

## 2013-04-18 HISTORY — DX: Essential tremor: G25.0

## 2013-04-18 MED ORDER — TOPIRAMATE 25 MG PO TABS: ORAL_TABLET | ORAL | Status: DC

## 2013-04-18 MED ORDER — LIDOCAINE 5 % EX PTCH
1.0000 | MEDICATED_PATCH | CUTANEOUS | Status: DC
Start: 2013-04-18 — End: 2013-07-11

## 2013-04-18 NOTE — Progress Notes (Signed)
Reason for visit: Essential tremor  Jose Leach is an 80 y.o. male  History of present illness:  Jose Leach is a 80 year old right-handed white male with a history of an essential tremor. The patient has an associated gait disturbance. The patient was found to have evidence of spinal stenosis with cord impingement on a study done in 2013. The patient was referred to neurosurgery, but surgery was not recommended. The patient has continued to have a gait disturbance, and he will fall on occasion. The last fall was one week ago. The patient uses a walker for ambulation. The patient has injured his coccyx with a fall previously, and he has chronic pain in this area. The patient has more discomfort when he is up during the day, sitting. The patient denies any significant issues at nighttime. The patient will occasionally have flexion jerking of the left leg, but he is able to control this by using a brace on the left foot. The patient returns to this office for further evaluation. The patient is on Topamax for the tremor. The patient indicates that he is able to feed himself, although he is sloppy. The patient likes to draw, and he is able to do this without much problem.   Past Medical History  Diagnosis Date  . Migraines     Dr Jannifer Franklin  . Peripheral neuropathy   . Benign essential tremor   . Cervical stenosis of spine   . Anemia     B12 deficiency  . Testosterone deficiency     Dr Lawerance Bach, [facility]  . Hypothyroidism   . Hyperlipidemia   . Depression     Dr Albertine Patricia  . History of shingles 2009  . H/O cardiovascular stress test     a. 02/2003 -> no ischemia/infarct.  Marland Kitchen Hx of echocardiogram     Echocardiogram 08/01/12: Mild LVH, EF 55-60%, normal wall motion.  . Sinus bradycardia   . PAD (peripheral artery disease)   . Carotid stenosis     s/p L CEA  . Fecal impaction 10/11/2012  . Macular edema   . Cervical spinal stenosis     Past Surgical History  Procedure Laterality  Date  . Appendectomy  1941  . Lumbar laminectomy    . Carotid endarterectomy       L  . Tonsillectomy and adenoidectomy    . Total hip arthroplasty      L  . Cataract extraction Right   . Eye surgery Right 02/2012    retina  . Inguinal hernia repair Bilateral 01/16/2013    Procedure: LAPAROSCOPIC BILATERAL INGUINAL DIRECT AND INDIRECT, FEMORAL, AND OBTURATOR HERNIAS;  Surgeon: Adin Hector, MD;  Location: [place];  Service: General;  Laterality: Bilateral;  . Insertion of mesh N/A 01/16/2013    Procedure: INSERTION OF MESH;  Surgeon: Adin Hector, MD;  Location: [place];  Service: General;  Laterality: N/A;  . Breast cyst excision Left 01/16/2013    Procedure: MASS EXCISION AXILLA;  Surgeon: Adin Hector, MD;  [facility];  Service: General;  Laterality: Left;    Family History  Problem Relation Age of Onset  . Stroke Mother 4  . Hypertension Mother   . Diabetes Mother   . Heart disease Mother   . Rectal cancer Father 23  . Heart disease Father     in 8s  . Cancer Father     colorectal  . Nephritis Sister   . Seizures Brother  died as child    Social history:  reports that he quit smoking about 34 years ago. His smoking use included Pipe and Cigars. He has never used smokeless tobacco. He reports that he drinks about 4.2 ounces of alcohol per week. He reports that he does not use illicit drugs.    Allergies  Allergen Reactions  . Latex Rash  . Tape Rash    Paper tape is ok to use     Medications:  Current Outpatient Prescriptions on File Prior to Visit  Medication Sig Dispense Refill  . aspirin 81 MG tablet Take 81 mg by mouth every morning.       Marland Kitchen atorvastatin (LIPITOR) 20 MG tablet Take 20 mg by mouth every evening.       . B Complex-C (B-COMPLEX WITH VITAMIN C) tablet Take 1 tablet by mouth every morning.      . bimatoprost (LUMIGAN) 0.01 % SOLN Place 1 drop into both eyes at bedtime.      . brimonidine (ALPHAGAN) 0.15 % ophthalmic solution Place 1  drop into the right eye 2 (two) times daily.      Marland Kitchen buPROPion (WELLBUTRIN XL) 300 MG 24 hr tablet Take 300 mg by mouth every morning.       . cholecalciferol (VITAMIN D) 1000 UNITS tablet Take 4,000 Units by mouth every morning.      . clonazePAM (KLONOPIN) 0.5 MG tablet Take 0.5 mg by mouth 3 (three) times daily as needed for anxiety.       . Flaxseed, Linseed, (FLAX SEEDS PO) Take 1 tablet by mouth every morning.       Javier Docker Oil 500 MG CAPS Take 500 mg by mouth every morning.       Marland Kitchen levothyroxine (SYNTHROID, LEVOTHROID) 50 MCG tablet Take 25-50 mcg by mouth every morning. Take 1/2 tab (40mcg) on Sundays, 1 tab (72mcg) on all other days.      . Linaclotide (LINZESS) 290 MCG CAPS capsule Take 1 capsule (290 mcg total) by mouth daily.  20 capsule  0  . mirtazapine (REMERON) 45 MG tablet Take 45 mg by mouth at bedtime.      . Multiple Vitamins-Minerals (ICAPS AREDS FORMULA PO) Take 1 capsule by mouth 2 (two) times daily.      . sertraline (ZOLOFT) 100 MG tablet Take 200 mg by mouth every evening.       . tamsulosin (FLOMAX) 0.4 MG CAPS capsule Take 0.4 mg by mouth daily after supper.      . testosterone ([name]) 50 MG/5GM GEL Place 5 g onto the skin every morning.       . vitamin B-12 (CYANOCOBALAMIN) 1000 MCG tablet Take 1,000 mcg by mouth every morning.        No current facility-administered medications on file prior to visit.    ROS:  Out of a complete 14 system review of symptoms, the patient complains only of the following symptoms, and all other reviewed systems are negative.  Constipation, diarrhea  Tremors Gait instability  Blood pressure 108/60, pulse 56, weight 151 lb (68.493 kg).  Physical Exam  General: The patient is alert and cooperative at the time of the examination.  Skin: 1-2+ edema of the ankles is noted bilaterally.   Neurologic Exam  Mental status: The patient is oriented x 3.  Cranial nerves: Facial symmetry is present. Speech is normal, no aphasia or  dysarthria is noted. Extraocular movements are full. Visual fields are full. A prominent vocal tremor is noted.  Motor: The patient has good strength in all 4 extremities.  Sensory examination: Soft touch sensation is symmetric on the face, arms, and legs.   Coordination: The patient has good finger-nose-finger and heel-to-shin bilaterally. The patient has an intention tremor with finger-nose-finger bilaterally.  Gait and station: The patient has a slightly wide-based, unsteady gait. The patient uses a walker for ambulation. Tandem gait was not attempted.  Romberg is negative. No drift is seen.  Reflexes: Deep tendon reflexes are symmetric.   Assessment/Plan:  One. Benign essential tremor  2. Cervical spondylosis, spinal stenosis  3. Gait instability  4. Coccygeal injury, chronic pain  The patient will be placed on a Lidoderm patch for the chronic pain associated with the injury to the coccyx. The patient will continue the Topamax for now. The patient will followup in one year. The patient is to use the walker at all times with ambulation.   Jill Alexanders MD 04/18/2013 8:42 PM  [facility] [address] [place], [place] [address]  Phone [phone] Fax [phone]

## 2013-04-18 NOTE — Patient Instructions (Signed)

## 2013-05-15 ENCOUNTER — Telehealth: Payer: Self-pay | Admitting: Nurse Practitioner

## 2013-05-16 ENCOUNTER — Other Ambulatory Visit: Payer: Self-pay | Admitting: *Deleted

## 2013-05-16 MED ORDER — LINACLOTIDE 290 MCG PO CAPS: 290.0000 ug | ORAL_CAPSULE | Freq: Every day | ORAL | Status: DC

## 2013-06-27 ENCOUNTER — Other Ambulatory Visit: Payer: Self-pay

## 2013-06-27 MED ORDER — LEVOTHYROXINE SODIUM 50 MCG PO TABS: 25.0000 ug | ORAL_TABLET | Freq: Every morning | ORAL | Status: DC

## 2013-06-29 ENCOUNTER — Telehealth: Payer: Self-pay | Admitting: Gastroenterology

## 2013-06-29 NOTE — Telephone Encounter (Signed)
Patient left me a message that he is seeing his PCP and does not need to speak to me.

## 2013-06-29 NOTE — Telephone Encounter (Signed)
Spoke with patient and he has had diarrhea and stomach cramping x 2 days. He reports diarrhea x 1 today and 2 times yesterday. He is taking Linzess. Suggested he try the Linzess every other day to see if this helps. He also reports the nurse at [facility] told him his blood pressure is up. Patient instructed to call his PCP for this.

## 2013-06-29 NOTE — Telephone Encounter (Signed)
Left a message for patient to call me. 

## 2013-06-30 ENCOUNTER — Ambulatory Visit (INDEPENDENT_AMBULATORY_CARE_PROVIDER_SITE_OTHER): Payer: Medicare Other | Admitting: Family Medicine

## 2013-06-30 ENCOUNTER — Encounter: Payer: Self-pay | Admitting: Family Medicine

## 2013-06-30 VITALS — BP 110/72 | HR 51 | Temp 96.7°F | Wt 153.0 lb

## 2013-06-30 DIAGNOSIS — R197 Diarrhea, unspecified: Secondary | ICD-10-CM | POA: Insufficient documentation

## 2013-06-30 NOTE — Patient Instructions (Addendum)
I think you had either viral stomach bug or possible food poisoning. You are looking ok today though.  To prevent dehydration, push small sips of clear fluids over next several days - water, flat gingerale, jello and slowly advance diet as tolerated.  May even try some pedialyte Continue linzess as recommended by GI. I think you should feel better each day. If not keeping fluids down or diarrhea continues, please return for re evaluation.  Food Poisoning Food poisoning is an illness caused by something you ate or drank. There are over 250 known causes of food poisoning. However, many other causes are unknown.You can be treated even if the exact cause of your food poisoning is not known. In most cases, food poisoning is mild and lasts 1 to 2 days. However, some cases can be serious, especially for people with low immune systems, the elderly, children and infants, and pregnant women. CAUSES  Poor personal hygiene, improper cleaning of storage and preparation areas, and unclean utensils can cause infection or tainting (contamination) of foods. The causes of food poisoning are numerous.Infectious agents, such as viruses, bacteria, or parasites, can cause harm by infecting the intestine and disrupting the absorption of nutrients and water. This can cause diarrhea and lead to dehydration. Viruses are responsible for most of the food poisonings in which an agent is found. Parasites are less likely to cause food poisoning. Toxic agents, such as poisonous mushrooms, marine algae, and pesticides can also cause food poisoning.  Viral causes of food poisoning include:  Norovirus.  Rotavirus.  Hepatitis A.  Bacterial causes of food poisoning include:  Salmonellae.  Campylobacter.  Bacillus cereus.  Escherichia coli (E. coli).  Shigella.  Listeria monocytogenes.  Clostridium botulinum (botulism).  Vibrio cholerae.  Parasites that can cause food poisoning  include:  Giardia.  Cryptosporidium.  Toxoplasma. SYMPTOMS Symptoms may appear several hours or longer after consuming the contaminated food or drink. Symptoms may include:  Nausea.  Vomiting.  Cramping.  Diarrhea.  Fever and chills.  Muscle aches. DIAGNOSIS Your caregiver may be able to diagnose food poisoning from a list of what you have recently eaten and results from lab tests. Diagnostic tests may include an exam of the feces. TREATMENT In most cases, treatment focuses on helping to relieve your symptoms and staying well hydrated. Antibiotics are rarely needed. In severe cases, hospitalization may be required. PREVENTION   Wash your hands, food preparation surfaces, and utensils thoroughly before and after handling raw foods.  Keep refrigerated foods below 40 F (5 C).  Serve hot foods immediately or keep them heated above 140 F (60 C).  Divide large volumes of food into small portions for rapid cooling in the refrigerator. Hot, bulky foods in the refrigerator can raise the temperature of other foods that have already cooled.  Follow approved canning procedures.  Heat canned foods thoroughly before tasting.  When in doubt, throw it out.  Infants, the elderly, women who are pregnant, and people with compromised immune systems are especially susceptible to food poisoning. These people should never consume unpasteurized cheese, unpasteurized cider, raw fish, raw seafood, or raw meat type products. HOME CARE INSTRUCTIONS   Drink enough water and fluids to keep your urine clear or pale yellow. Drink small amounts of fluids frequently and increase as tolerated.  Ask your caregiver for specific rehydration instructions.  Avoid:  Foods high in sugar.  Alcohol.  Carbonated drinks.  Tobacco.  Juice.  Caffeine drinks.  Extremely hot or cold fluids.  Fatty, greasy  foods.  Too much intake of anything at one time.  Dairy products until 24 to 48 hours  after diarrhea stops.  You may consume probiotics. Probiotics are active cultures of beneficial bacteria. They may lessen the amount and number of diarrheal stools in adults. Probiotics can be found in yogurt with active cultures and in supplements.  Wash your hands well to avoid spreading the bacteria.  Only take over-the-counter or prescription medicines for pain, discomfort, or fever as directed by your caregiver. Do not give aspirin to children.  Ask your caregiver if you should continue to take your regular prescribed and over-the-counter medicines. SEEK IMMEDIATE MEDICAL CARE IF:   You have difficulty breathing, swallowing, talking, or moving.  You develop blurred vision.  You are unable to keep fluids down.  You faint or nearly faint.  Your eyes turn yellow.  Vomiting or diarrhea develops or becomes persistent.  Abdominal pain develops, increases, or localizes in one small area.  You have a fever.  The diarrhea becomes excessive or contains blood or mucus.  You develop excessive weakness, dizziness, or extreme thirst.  You have no urine for 8 hours. MAKE SURE YOU:   Understand these instructions.  Will watch your condition.  Will get help right away if you are not doing well or get worse. Document Released: 10/31/2003 Document Revised: 04/26/2011 Document Reviewed: 06/18/2010 [facility] Patient Information 2014 [place], [place].

## 2013-06-30 NOTE — Assessment & Plan Note (Addendum)
Acute diarrheal episode after ate salmon meal for mother's day.  Anticipate either viral enteritis or more likely acute food poisoning. Discussed supportive care at home including pushing small sips of clear fluids over next 1-2 days.  See pt instructions. I do not feel he needs IV fluid rehydration as long as he is able to keep up with fluids at home Should be slowly improving each day (today feeling better than yesterday). Red flags to return for further evaluation discussed, pt and wife agree.

## 2013-06-30 NOTE — Progress Notes (Signed)
BP 110/72  Pulse 51  Temp(Src) 96.7 F (35.9 C) (Oral)  Wt 153 lb (69.4 kg)  SpO2 98%   CC: diarrhea  Subjective:    Patient ID: Jose Leach, male    DOB: Sep 21, 1932, 80 y.o.   MRN: [id number]  HPI: Jose Leach is a 80 y.o. male presenting on 06/30/2013 for Diarrhea   3 episodes of diarrhea in the past week.  Loose stools, not watery.  Some normal stools in between.  Tuesday spent several hours in the bathroom with persistent diarrhea. Denies blood in stool.  No night time stooling. Some cramping with diarrhea.  Denies fevers/chills, abd pain, nausea/vomiting, dizziness or weakness, feels has been drinking enough fluids (diet pepsi and frappuchinos).  Urinating well - no dysuria or decreased UOP noted.  Wears depens.  H/o CKD "kidneys at 38%".  Sees Dr. Rosana Hoes urology.  Self caths at home - although recently has been able to restart voiding on his own.  Denies new meds or recent antibiotic use.  Did have UTI 02/2013. [place]. No recent travel. No sick contacts at home.  Did eat out on Mother's day - salmon at restaurant, and did start feeling ill after this Lives at [place] bp systolic 735. BP Readings from Last 3 Encounters:  06/30/13 110/72  04/18/13 108/60  03/14/13 142/72   Past Medical History  Diagnosis Date  . Migraines     Dr Jannifer Franklin  . Peripheral neuropathy   . Benign essential tremor   . Cervical stenosis of spine   . Anemia     B12 deficiency  . Testosterone deficiency     Dr Lawerance Bach, [facility]  . Hypothyroidism   . Hyperlipidemia   . Depression     Dr Albertine Patricia  . History of shingles 2009  . H/O cardiovascular stress test     a. 02/2003 -> no ischemia/infarct.  Marland Kitchen Hx of echocardiogram     Echocardiogram 08/01/12: Mild LVH, EF 55-60%, normal wall motion.  . Sinus bradycardia   . PAD (peripheral artery disease)   . Carotid stenosis     s/p L CEA  . Fecal impaction 10/11/2012  . Macular edema   . Cervical spinal  stenosis     Relevant past medical, surgical, family and social history reviewed and updated as indicated.  Allergies and medications reviewed and updated. Current Outpatient Prescriptions on File Prior to Visit  Medication Sig  . aspirin 81 MG tablet Take 81 mg by mouth every morning.   Marland Kitchen atorvastatin (LIPITOR) 20 MG tablet Take 20 mg by mouth every evening.   . B Complex-C (B-COMPLEX WITH VITAMIN C) tablet Take 1 tablet by mouth every morning.  . bimatoprost (LUMIGAN) 0.01 % SOLN Place 1 drop into both eyes at bedtime.  . brimonidine (ALPHAGAN) 0.15 % ophthalmic solution Place 1 drop into the right eye 2 (two) times daily.  Marland Kitchen buPROPion (WELLBUTRIN XL) 300 MG 24 hr tablet Take 300 mg by mouth every morning.   . cholecalciferol (VITAMIN D) 1000 UNITS tablet Take 4,000 Units by mouth every morning.  . clonazePAM (KLONOPIN) 0.5 MG tablet Take 0.5 mg by mouth 3 (three) times daily as needed for anxiety.   . Flaxseed, Linseed, (FLAX SEEDS PO) Take 1 tablet by mouth every morning.    Docker Oil 500 MG CAPS Take 500 mg by mouth every morning.   Marland Kitchen levothyroxine (SYNTHROID, LEVOTHROID) 50 MCG tablet Take 0.5-1 tablets (25-50 mcg total) by mouth every  morning. Take 1/2 tab (72mcg) on Sundays, 1 tab (51mcg) on all other days.  Marland Kitchen lidocaine (LIDODERM) 5 % Place 1 patch onto the skin daily. Remove & Discard patch within 12 hours or as directed by MD  . Linaclotide (LINZESS) 290 MCG CAPS capsule Take 1 capsule (290 mcg total) by mouth daily.  . mirtazapine (REMERON) 45 MG tablet Take 45 mg by mouth at bedtime.  . Multiple Vitamins-Minerals (ICAPS AREDS FORMULA PO) Take 1 capsule by mouth 2 (two) times daily.  . sertraline (ZOLOFT) 100 MG tablet Take 200 mg by mouth every evening.   . tamsulosin (FLOMAX) 0.4 MG CAPS capsule Take 0.4 mg by mouth daily after supper.  . testosterone ([name]) 50 MG/5GM GEL Place 5 g onto the skin every morning.   . topiramate (TOPAMAX) 25 MG tablet One tablet in the morning and  two tablets in the evening  . vitamin B-12 (CYANOCOBALAMIN) 1000 MCG tablet Take 1,000 mcg by mouth every morning.    No current facility-administered medications on file prior to visit.    Review of Systems Per HPI unless specifically indicated above    Objective:    BP 110/72  Pulse 51  Temp(Src) 96.7 F (35.9 C) (Oral)  Wt 153 lb (69.4 kg)  SpO2 98%  Physical Exam  Nursing note and vitals reviewed. Constitutional: He appears well-developed. No distress.  Thin frial appearing elderly using foldable rollator  HENT:  Mouth/Throat: Oropharynx is clear and moist. No oropharyngeal exudate.  Cardiovascular: Normal rate, regular rhythm, normal heart sounds and intact distal pulses.   No murmur heard. Pulmonary/Chest: Effort normal and breath sounds normal. No respiratory distress. He has no wheezes. He has no rales.  Abdominal: Soft. Normal appearance and bowel sounds are normal. He exhibits no distension and no mass. There is no hepatosplenomegaly. There is no tenderness. There is no rigidity, no rebound, no guarding, no CVA tenderness and negative Murphy's sign.  Musculoskeletal: He exhibits edema (tr pedal edema).  Skin: Skin is warm and dry.       Assessment & Plan:   Problem List Items Addressed This Visit   Diarrhea - Primary     Acute diarrheal episode after ate salmon meal for mother's day.  Anticipate either viral enteritis or more likely acute food poisoning. Discussed supportive care at home including pushing small sips of clear fluids over next 1-2 days.  See pt instructions. I do not feel he needs IV fluid rehydration as long as he is able to keep up with fluids at home Should be slowly improving each day (today feeling better than yesterday). Red flags to return for further evaluation discussed, pt and wife agree.        Follow up plan: Return if symptoms worsen or fail to improve.

## 2013-06-30 NOTE — Progress Notes (Signed)
Pre visit review using our clinic review tool, if applicable. No additional management support is needed unless otherwise documented below in the visit note. 

## 2013-07-11 ENCOUNTER — Encounter: Payer: Self-pay | Admitting: Cardiovascular Disease

## 2013-07-11 ENCOUNTER — Ambulatory Visit (INDEPENDENT_AMBULATORY_CARE_PROVIDER_SITE_OTHER): Payer: Medicare Other | Admitting: Cardiovascular Disease

## 2013-07-11 VITALS — BP 130/78 | HR 51 | Ht 73.0 in | Wt 153.0 lb

## 2013-07-11 DIAGNOSIS — I6529 Occlusion and stenosis of unspecified carotid artery: Secondary | ICD-10-CM

## 2013-07-11 NOTE — Patient Instructions (Addendum)
Your physician wants you to follow-up in: 1 YEAR with Dr Burt Knack.  You will receive a reminder letter in the mail two months in advance. If you don't receive a letter, please call our office to schedule the follow-up appointment.  Your physician recommends that you continue on your current medications as directed. Please refer to the Current Medication list given to you today.  Your physician has requested that you have a carotid duplex. This test is an ultrasound of the carotid arteries in your neck. It looks at blood flow through these arteries that supply the brain with blood. Allow one hour for this exam. There are no restrictions or special instructions.

## 2013-07-11 NOTE — Progress Notes (Signed)
HPI:  80 year old gentleman presenting for followup evaluation. The patient has been followed for peripheral arterial disease, carotid stenosis, and bradycardia.  The patient has problems related to gait instability and history of mechanical falls. He's really not had much trouble from a cardiac perspective. He specifically denies chest pain, chest pressure, palpitations, lightheadedness, or syncope.  Outpatient Encounter Prescriptions as of 07/11/2013  Medication Sig  . aspirin 81 MG tablet Take 81 mg by mouth every morning.   Marland Kitchen atorvastatin (LIPITOR) 20 MG tablet Take 20 mg by mouth every evening.   . B Complex-C (B-COMPLEX WITH VITAMIN C) tablet Take 1 tablet by mouth every morning.  . bimatoprost (LUMIGAN) 0.01 % SOLN Place 1 drop into both eyes at bedtime.  . brimonidine (ALPHAGAN) 0.15 % ophthalmic solution Place 1 drop into the right eye 2 (two) times daily.  Marland Kitchen buPROPion (WELLBUTRIN XL) 300 MG 24 hr tablet Take 300 mg by mouth every morning.   . cholecalciferol (VITAMIN D) 1000 UNITS tablet Take 4,000 Units by mouth every morning.  . clonazePAM (KLONOPIN) 0.5 MG tablet Take 0.5 mg by mouth 3 (three) times daily as needed for anxiety.   . Flaxseed, Linseed, (FLAX SEEDS PO) Take 1 tablet by mouth every morning.   Javier Docker Oil 500 MG CAPS Take 500 mg by mouth every morning.   Marland Kitchen levothyroxine (SYNTHROID, LEVOTHROID) 50 MCG tablet Take 0.5-1 tablets (25-50 mcg total) by mouth every morning. Take 1/2 tab (79mcg) on Sundays, 1 tab (39mcg) on all other days.  . Linaclotide (LINZESS) 290 MCG CAPS capsule Take 1 capsule (290 mcg total) by mouth daily.  . mirtazapine (REMERON) 45 MG tablet Take 45 mg by mouth at bedtime.  . Multiple Vitamins-Minerals (ICAPS AREDS FORMULA PO) Take 1 capsule by mouth 2 (two) times daily.  . sertraline (ZOLOFT) 100 MG tablet Take 200 mg by mouth every evening.   . tamsulosin (FLOMAX) 0.4 MG CAPS capsule Take 0.4 mg by mouth daily after supper.  . testosterone  ([name]) 50 MG/5GM GEL Place 5 g onto the skin every morning.   . topiramate (TOPAMAX) 25 MG tablet One tablet in the morning and two tablets in the evening  . vitamin B-12 (CYANOCOBALAMIN) 1000 MCG tablet Take 1,000 mcg by mouth every morning.   . [DISCONTINUED] lidocaine (LIDODERM) 5 % Place 1 patch onto the skin daily. Remove & Discard patch within 12 hours or as directed by MD    Allergies  Allergen Reactions  . Latex Rash  . Tape Rash    Paper tape is ok to use     Past Medical History  Diagnosis Date  . Migraines     Dr Jannifer Franklin  . Peripheral neuropathy   . Benign essential tremor   . Cervical stenosis of spine   . Anemia     B12 deficiency  . Testosterone deficiency     Dr Lawerance Bach, [facility]  . Hypothyroidism   . Hyperlipidemia   . Depression     Dr Albertine Patricia  . History of shingles 2009  . H/O cardiovascular stress test     a. 02/2003 -> no ischemia/infarct.  Marland Kitchen Hx of echocardiogram     Echocardiogram 08/01/12: Mild LVH, EF 55-60%, normal wall motion.  . Sinus bradycardia   . PAD (peripheral artery disease)   . Carotid stenosis     s/p L CEA  . Fecal impaction 10/11/2012  . Macular edema   . Cervical spinal stenosis    BP 130/78  Pulse 51  Ht 6\' 1"  (1.854 m)  Wt 69.4 kg (153 lb)  BMI 20.19 kg/m2  ROS: positive for weight loss and decreased energy, otherwise negative except as outlined above.   PHYSICAL EXAM: Pt is alert and oriented, somewhat frail-appearing but otherwise age-appropriate gentleman in NAD HEENT: normal Neck: JVP - normal, carotids 2+= without bruits Lungs: CTA bilaterally CV: RRR without murmur or gallop Abd: soft, NT, Positive BS, no hepatomegaly Ext: 1+ pretibial edema bilaterally, distal pulses intact and equal Skin: warm/dry no rash  EKG:  Sinus bradycardia 51 beats per minute, PACs, otherwise within normal limits  2-D echocardiogram 08/01/2012: Study Conclusions  Left ventricle: The cavity size was normal. There was mild concentric  hypertrophy. Systolic function was normal. The estimated ejection fraction was in the range of 55% to 60%. Wall motion was normal; there were no regional wall motion abnormalities.    ASSESSMENT AND PLAN: 1. Sinus bradycardia. The patient has undergone formal EP evaluation and a pacemaker was not indicated. I agree he remains stable. Would avoid any heart rate lowering medications. I will see him back in one year for followup.  2. Carotid stenosis status post left carotid endarterectomy. Will arrange followup Doppler studies. No recent neurologic symptoms.  3. Weight loss, unintentional. Advised that he discuss with his PCP.   Sherren Mocha 07/11/2013 2:43 PM

## 2013-07-17 ENCOUNTER — Encounter (HOSPITAL_COMMUNITY): Payer: Medicare Other

## 2013-07-17 ENCOUNTER — Ambulatory Visit (HOSPITAL_COMMUNITY): Payer: Medicare Other | Attending: Cardiology | Admitting: *Deleted

## 2013-07-17 DIAGNOSIS — I6529 Occlusion and stenosis of unspecified carotid artery: Secondary | ICD-10-CM

## 2013-07-17 NOTE — Progress Notes (Signed)
Carotid duplex complete 

## 2013-07-21 ENCOUNTER — Other Ambulatory Visit: Payer: Self-pay | Admitting: Gastroenterology

## 2013-09-05 ENCOUNTER — Emergency Department (HOSPITAL_COMMUNITY): Payer: Medicare Other

## 2013-09-05 ENCOUNTER — Other Ambulatory Visit: Payer: Self-pay

## 2013-09-05 ENCOUNTER — Emergency Department (HOSPITAL_COMMUNITY)
Admission: EM | Admit: 2013-09-05 | Discharge: 2013-09-05 | Disposition: A | Discharge status: Home / Self Care | Payer: Medicare Other | Attending: Emergency Medicine | Admitting: Emergency Medicine

## 2013-09-05 ENCOUNTER — Encounter (HOSPITAL_COMMUNITY): Payer: Self-pay | Admitting: Emergency Medicine

## 2013-09-05 DIAGNOSIS — E785 Hyperlipidemia, unspecified: Secondary | ICD-10-CM | POA: Insufficient documentation

## 2013-09-05 DIAGNOSIS — Z79899 Other long term (current) drug therapy: Secondary | ICD-10-CM | POA: Insufficient documentation

## 2013-09-05 DIAGNOSIS — R5383 Other fatigue: Principal | ICD-10-CM

## 2013-09-05 DIAGNOSIS — Z87891 Personal history of nicotine dependence: Secondary | ICD-10-CM | POA: Insufficient documentation

## 2013-09-05 DIAGNOSIS — Z91040 Latex allergy status: Secondary | ICD-10-CM | POA: Diagnosis not present

## 2013-09-05 DIAGNOSIS — E039 Hypothyroidism, unspecified: Secondary | ICD-10-CM | POA: Insufficient documentation

## 2013-09-05 DIAGNOSIS — R5381 Other malaise: Secondary | ICD-10-CM | POA: Insufficient documentation

## 2013-09-05 DIAGNOSIS — Z8619 Personal history of other infectious and parasitic diseases: Secondary | ICD-10-CM | POA: Diagnosis not present

## 2013-09-05 DIAGNOSIS — F329 Major depressive disorder, single episode, unspecified: Secondary | ICD-10-CM | POA: Insufficient documentation

## 2013-09-05 DIAGNOSIS — Z7982 Long term (current) use of aspirin: Secondary | ICD-10-CM | POA: Insufficient documentation

## 2013-09-05 DIAGNOSIS — D649 Anemia, unspecified: Secondary | ICD-10-CM | POA: Insufficient documentation

## 2013-09-05 DIAGNOSIS — G43909 Migraine, unspecified, not intractable, without status migrainosus: Secondary | ICD-10-CM | POA: Insufficient documentation

## 2013-09-05 DIAGNOSIS — M4802 Spinal stenosis, cervical region: Secondary | ICD-10-CM | POA: Diagnosis not present

## 2013-09-05 DIAGNOSIS — F3289 Other specified depressive episodes: Secondary | ICD-10-CM | POA: Diagnosis not present

## 2013-09-05 DIAGNOSIS — G609 Hereditary and idiopathic neuropathy, unspecified: Secondary | ICD-10-CM | POA: Insufficient documentation

## 2013-09-05 DIAGNOSIS — E291 Testicular hypofunction: Secondary | ICD-10-CM | POA: Diagnosis not present

## 2013-09-05 DIAGNOSIS — T83511A Infection and inflammatory reaction due to indwelling urethral catheter, initial encounter: Secondary | ICD-10-CM

## 2013-09-05 DIAGNOSIS — N390 Urinary tract infection, site not specified: Secondary | ICD-10-CM | POA: Insufficient documentation

## 2013-09-05 DIAGNOSIS — R531 Weakness: Secondary | ICD-10-CM

## 2013-09-05 LAB — CBC WITH DIFFERENTIAL/PLATELET
Basophils Absolute: 0 10*3/uL (ref 0.0–0.1)
Basophils Relative: 0 % (ref 0–1)
EOS ABS: 0.3 10*3/uL (ref 0.0–0.7)
EOS PCT: 4 % (ref 0–5)
HCT: 46.3 % (ref 39.0–52.0)
Hemoglobin: 15 g/dL (ref 13.0–17.0)
Lymphocytes Relative: 18 % (ref 12–46)
Lymphs Abs: 1.1 10*3/uL (ref 0.7–4.0)
MCH: 30.1 pg (ref 26.0–34.0)
MCHC: 32.4 g/dL (ref 30.0–36.0)
MCV: 93 fL (ref 78.0–100.0)
MONOS PCT: 10 % (ref 3–12)
Monocytes Absolute: 0.6 10*3/uL (ref 0.1–1.0)
Neutro Abs: 4.2 10*3/uL (ref 1.7–7.7)
Neutrophils Relative %: 68 % (ref 43–77)
Platelets: 163 10*3/uL (ref 150–400)
RBC: 4.98 MIL/uL (ref 4.22–5.81)
RDW: 13.3 % (ref 11.5–15.5)
WBC: 6.3 10*3/uL (ref 4.0–10.5)

## 2013-09-05 LAB — COMPREHENSIVE METABOLIC PANEL
ALT: 27 U/L (ref 0–53)
ANION GAP: 9 (ref 5–15)
AST: 37 U/L (ref 0–37)
Albumin: 3.5 g/dL (ref 3.5–5.2)
Alkaline Phosphatase: 57 U/L (ref 39–117)
BUN: 35 mg/dL — ABNORMAL HIGH (ref 6–23)
CALCIUM: 9.6 mg/dL (ref 8.4–10.5)
CO2: 27 mEq/L (ref 19–32)
CREATININE: 1.21 mg/dL (ref 0.50–1.35)
Chloride: 106 mEq/L (ref 96–112)
GFR calc non Af Amer: 55 mL/min — ABNORMAL LOW (ref >90)
GFR, EST AFRICAN AMERICAN: 64 mL/min — AB (ref >90)
GLUCOSE: 81 mg/dL (ref 70–99)
Potassium: 4.8 mEq/L (ref 3.7–5.3)
Sodium: 142 mEq/L (ref 137–147)
TOTAL PROTEIN: 6.8 g/dL (ref 6.0–8.3)
Total Bilirubin: 0.3 mg/dL (ref 0.3–1.2)

## 2013-09-05 LAB — URINALYSIS, ROUTINE W REFLEX MICROSCOPIC
Bilirubin Urine: NEGATIVE
Glucose, UA: NEGATIVE mg/dL
Ketones, ur: NEGATIVE mg/dL
NITRITE: NEGATIVE
PROTEIN: NEGATIVE mg/dL
Specific Gravity, Urine: 1.01 (ref 1.005–1.030)
Urobilinogen, UA: 0.2 mg/dL (ref 0.0–1.0)
pH: 7 (ref 5.0–8.0)

## 2013-09-05 LAB — LACTIC ACID, PLASMA: LACTIC ACID, VENOUS: 1.4 mmol/L (ref 0.5–2.2)

## 2013-09-05 LAB — URINE MICROSCOPIC-ADD ON

## 2013-09-05 LAB — PRO B NATRIURETIC PEPTIDE: Pro B Natriuretic peptide (BNP): 472.2 pg/mL — ABNORMAL HIGH (ref 0–450)

## 2013-09-05 LAB — TROPONIN I

## 2013-09-05 MED ORDER — SULFAMETHOXAZOLE-TMP DS 800-160 MG PO TABS: 1.0000 | ORAL_TABLET | Freq: Two times a day (BID) | ORAL | Status: DC

## 2013-09-05 MED ORDER — LORAZEPAM 1 MG PO TABS
1.0000 mg | ORAL_TABLET | Freq: Once | ORAL | Status: AC
  Administered 2013-09-05: 1 mg via ORAL
  Filled 2013-09-05: qty 1

## 2013-09-05 MED ORDER — SULFAMETHOXAZOLE-TMP DS 800-160 MG PO TABS
1.0000 | ORAL_TABLET | Freq: Once | ORAL | Status: AC
  Administered 2013-09-05: 1 via ORAL
  Filled 2013-09-05: qty 1

## 2013-09-05 NOTE — ED Notes (Addendum)
Per EMS, pt in independent living from [facility].  Pt has had increased weakness over last couple of Days.  Pt fell on Sunday but was not evaluated.  Pt self caths for urinary retention every other day.  Pt is alert and oriented x 4.  Wife on her way to hospital.  .  Vitals:  160 palp, hr 66, cbg 79, resp 18.

## 2013-09-05 NOTE — ED Notes (Signed)
Bed: WA20 Expected date:  Expected time:  Means of arrival:  Comments: EMS/ weak 

## 2013-09-05 NOTE — ED Notes (Signed)
Pt back from MRI 

## 2013-09-05 NOTE — ED Notes (Signed)
Sandwich and drink (220mL) provided.

## 2013-09-05 NOTE — ED Notes (Signed)
MD at bedside. 

## 2013-09-05 NOTE — Discharge Instructions (Signed)
Take the antibiotics until gone. The urine culture results should be back in about 48 hours. Talk to his doctor about getting physical therapy to improve his strength.  Recheck if he seems worse, such as fever, vomiting.

## 2013-09-05 NOTE — ED Provider Notes (Addendum)
CSN: [id number]     Arrival date & time 09/05/13  1120 History   First MD Initiated Contact with Patient 09/05/13 1122     Chief Complaint  Patient presents with  . Weakness     (Consider location/radiation/quality/duration/timing/severity/associated sxs/prior Treatment) HPI Wife reports patient fell twice 3 days ago without any injury. She states he uses a walker when he walks however he is having difficulty moving his feet. She reports  he normally eats well however he did not eat supper last night. He has had a mild cough and when he exerts himself he gets short of breath. He denies chest pain, vomiting, diarrhea, any headache, hitting his head, or loss of consciousness. Wife states yesterday he was sitting in a chair not talking and she asked him if he felt confused and he said yes. However he would not expand to her what that meant. Wife reports he has had weakness for a very long time although it seems to be getting worse recently.   His only new medications are Klonopin and antibiotics for an ear infection including ear drops.   PCP Dr Linna Darner Cardiology Dr Burt Knack  Past Medical History  Diagnosis Date  . Migraines     Dr Jannifer Franklin  . Peripheral neuropathy   . Benign essential tremor   . Cervical stenosis of spine   . Anemia     B12 deficiency  . Testosterone deficiency     Dr Lawerance Bach, [facility]  . Hypothyroidism   . Hyperlipidemia   . Depression     Dr Albertine Patricia  . History of shingles 2009  . H/O cardiovascular stress test     a. 02/2003 -> no ischemia/infarct.  Marland Kitchen Hx of echocardiogram     Echocardiogram 08/01/12: Mild LVH, EF 55-60%, normal wall motion.  . Sinus bradycardia   . PAD (peripheral artery disease)   . Carotid stenosis     s/p L CEA  . Fecal impaction 10/11/2012  . Macular edema   . Cervical spinal stenosis    Past Surgical History  Procedure Laterality Date  . Appendectomy  1941  . Lumbar laminectomy    . Carotid endarterectomy       L  . Tonsillectomy  and adenoidectomy    . Total hip arthroplasty      L  . Cataract extraction Right   . Eye surgery Right 02/2012    retina  . Inguinal hernia repair Bilateral 01/16/2013    Procedure: LAPAROSCOPIC BILATERAL INGUINAL DIRECT AND INDIRECT, FEMORAL, AND OBTURATOR HERNIAS;  Surgeon: Adin Hector, MD;  Location: [facility];  Service: General;  Laterality: Bilateral;  . Insertion of mesh N/A 01/16/2013    Procedure: INSERTION OF MESH;  Surgeon: Adin Hector, MD;  Location: [place];  Service: General;  Laterality: N/A;  . Breast cyst excision Left 01/16/2013    Procedure: MASS EXCISION AXILLA;  Surgeon: Adin Hector, MD;  Location: [facility];  Service: General;  Laterality: Left;   Family History  Problem Relation Age of Onset  . Stroke Mother 38  . Hypertension Mother   . Diabetes Mother   . Heart disease Mother   . Rectal cancer Father 61  . Heart disease Father     in 71s  . Cancer Father     colorectal  . Nephritis Sister   . Seizures Brother     died as child   History  Substance Use Topics  . Smoking status: Former Smoker -- 10 years  Types: Pipe, Cigars    Quit date: 02/16/1979  . Smokeless tobacco: Never Used     Comment: Quit about age 4   . Alcohol Use: 4.2 oz/week    7 Glasses of wine per week     Comment:    lives in ALF with his spouse Uses a walker  Review of Systems  All other systems reviewed and are negative.     Allergies  Latex and Tape  Home Medications   Prior to Admission medications   Medication Sig Start Date End Date Taking? Authorizing Provider  aspirin 81 MG tablet Take 81 mg by mouth every morning.    Yes Historical Provider, MD  atorvastatin (LIPITOR) 20 MG tablet Take 20 mg by mouth every evening.    Yes Historical Provider, MD  B Complex-C (B-COMPLEX WITH VITAMIN C) tablet Take 1 tablet by mouth every morning.   Yes Historical Provider, MD  bimatoprost (LUMIGAN) 0.01 % SOLN Place 1 drop into both eyes at bedtime.   Yes Historical Provider,  MD  brimonidine (ALPHAGAN) 0.15 % ophthalmic solution Place 1 drop into the right eye 2 (two) times daily. 02/16/13  Yes Historical Provider, MD  buPROPion (WELLBUTRIN XL) 300 MG 24 hr tablet Take 300 mg by mouth every morning.    Yes Historical Provider, MD  cholecalciferol (VITAMIN D) 1000 UNITS tablet Take 2,000 Units by mouth 2 (two) times daily.    Yes Historical Provider, MD  clonazePAM (KLONOPIN) 0.5 MG tablet Take 0.25-0.5 mg by mouth 3 (three) times daily.  01/03/13  Yes Historical Provider, MD  desoximetasone (TOPICORT) 0.05 % cream Apply 1 application topically 2 (two) times daily. Mixes with Luzu   Yes Historical Provider, MD  Flaxseed, Linseed, (FLAX SEEDS PO) Take 1 tablet by mouth every morning.    Yes Historical Provider, MD  fluconazole (DIFLUCAN) 150 MG tablet Take 150 mg by mouth every other day. 08/30/13  Yes Historical Provider, MD  Javier Docker Oil 500 MG CAPS Take 500 mg by mouth every morning.    Yes Historical Provider, MD  levothyroxine (SYNTHROID, LEVOTHROID) 50 MCG tablet Take 0.5-1 tablets (25-50 mcg total) by mouth every morning. Take 1/2 tab (46mcg) on Sundays, 1 tab (62mcg) on all other days. 06/27/13  Yes Hendricks Limes, MD  Linaclotide [facility]) 290 MCG CAPS capsule Take 290 mcg by mouth daily.   Yes Historical Provider, MD  Luliconazole (LUZU) 1 % CREA Apply 1 application topically 2 (two) times daily. Mixes with Topicort   Yes Historical Provider, MD  mirtazapine (REMERON) 45 MG tablet Take 45 mg by mouth at bedtime.   Yes Historical Provider, MD  Multiple Vitamins-Minerals (ICAPS AREDS FORMULA PO) Take 1 capsule by mouth daily.    Yes Historical Provider, MD  polyethylene glycol (MIRALAX / GLYCOLAX) packet Take 17 g by mouth daily as needed for mild constipation.   Yes Historical Provider, MD  sertraline (ZOLOFT) 100 MG tablet Take 200 mg by mouth every evening.    Yes Historical Provider, MD  testosterone ([name]) 50 MG/5GM GEL Place 5 g onto the skin every morning.     Yes Historical Provider, MD  topiramate (TOPAMAX) 25 MG tablet Take 75 mg by mouth daily.   Yes Historical Provider, MD  vitamin B-12 (CYANOCOBALAMIN) 1000 MCG tablet Take 1,000 mcg by mouth every morning.    Yes Historical Provider, MD  sulfamethoxazole-trimethoprim (BACTRIM DS) 800-160 MG per tablet Take 1 tablet by mouth 2 (two) times daily. 09/05/13   Janice Norrie,  MD   BP 195/70  Pulse 50  Temp(Src) 97.8 F (36.6 C) (Oral)  Resp 20  SpO2 100%  Vital signs normal except bradycardia  Physical Exam  Nursing note and vitals reviewed. Constitutional: He is oriented to person, place, and time. He appears well-developed and well-nourished.  Non-toxic appearance. He does not appear ill. No distress.  HENT:  Head: Normocephalic and atraumatic.  Right Ear: External ear normal.  Left Ear: External ear normal.  Nose: Nose normal. No mucosal edema or rhinorrhea.  Mouth/Throat: Oropharynx is clear and moist and mucous membranes are normal. No dental abscesses or uvula swelling.  Eyes: Conjunctivae and EOM are normal. Pupils are equal, round, and reactive to light.  Neck: Normal range of motion and full passive range of motion without pain. Neck supple.  Cardiovascular: Normal rate, regular rhythm and normal heart sounds.  Exam reveals no gallop and no friction rub.   No murmur heard. Pulmonary/Chest: Effort normal and breath sounds normal. No respiratory distress. He has no wheezes. He has no rhonchi. He has no rales. He exhibits no tenderness and no crepitus.  Abdominal: Soft. Normal appearance and bowel sounds are normal. He exhibits no distension. There is no tenderness. There is no rebound and no guarding.  Musculoskeletal: Normal range of motion. He exhibits no edema and no tenderness.  Moves all extremities well.   Neurological: He is alert and oriented to person, place, and time. He has normal strength. No cranial nerve deficit.  Face is symmetrical, he has equal grips, he is able to flex  his knees, he is able to lift his legs against gravity or short period of time, he has mild bilateral pronator drift  Skin: Skin is warm, dry and intact. No rash noted. No erythema. No pallor.  Psychiatric: He has a normal mood and affect. His speech is normal and behavior is normal. His mood appears not anxious.    ED Course  Procedures (including critical care time)  Medications  LORazepam (ATIVAN) tablet 1 mg (1 mg Oral Given 09/05/13 1351)  sulfamethoxazole-trimethoprim (BACTRIM DS) 800-160 MG per tablet 1 tablet (1 tablet Oral Given 09/05/13 1603)   Review of Dr. Antionette Char cardiology notes from May shows patient's heart rate was 51 at that time. He discusses patient has had EP studies done that indicated the patient did not need a pacemaker.  1320 We discussed the patient's lab results and CT head. We discussed getting a MRI of his brain and he is agreeable if he can have something for claustrophobia.   Pt given his MR results. We discussed treated for possible UTI with his chronic indwelling catheter. Pt had Urine Culture in Jan that grew > 100K colonies of E coli, sensitive to Cipro, Septra, Rocephin. Patient was started on Septra.I discussed with his wife about getting physical therapy which he has done in the past to see if that would improve his strength and hopefully help him fall less and improve his weakness.     Labs Reviews  Results for orders placed during the hospital encounter of 09/05/13  CBC WITH DIFFERENTIAL      Result Value Ref Range   WBC 6.3  4.0 - 10.5 K/uL   RBC 4.98  4.22 - 5.81 MIL/uL   Hemoglobin 15.0  13.0 - 17.0 g/dL   HCT 46.3  39.0 - 52.0 %   MCV 93.0  78.0 - 100.0 fL   MCH 30.1  26.0 - 34.0 pg   MCHC 32.4  30.0 -  36.0 g/dL   RDW 13.3  11.5 - 15.5 %   Platelets 163  150 - 400 K/uL   Neutrophils Relative % 68  43 - 77 %   Neutro Abs 4.2  1.7 - 7.7 K/uL   Lymphocytes Relative 18  12 - 46 %   Lymphs Abs 1.1  0.7 - 4.0 K/uL   Monocytes Relative 10  3 -  12 %   Monocytes Absolute 0.6  0.1 - 1.0 K/uL   Eosinophils Relative 4  0 - 5 %   Eosinophils Absolute 0.3  0.0 - 0.7 K/uL   Basophils Relative 0  0 - 1 %   Basophils Absolute 0.0  0.0 - 0.1 K/uL  COMPREHENSIVE METABOLIC PANEL      Result Value Ref Range   Sodium 142  137 - 147 mEq/L   Potassium 4.8  3.7 - 5.3 mEq/L   Chloride 106  96 - 112 mEq/L   CO2 27  19 - 32 mEq/L   Glucose, Bld 81  70 - 99 mg/dL   BUN 35 (*) 6 - 23 mg/dL   Creatinine, Ser 1.21  0.50 - 1.35 mg/dL   Calcium 9.6  8.4 - 10.5 mg/dL   Total Protein 6.8  6.0 - 8.3 g/dL   Albumin 3.5  3.5 - 5.2 g/dL   AST 37  0 - 37 U/L   ALT 27  0 - 53 U/L   Alkaline Phosphatase 57  39 - 117 U/L   Total Bilirubin 0.3  0.3 - 1.2 mg/dL   GFR calc non Af Amer 55 (*) >90 mL/min   GFR calc Af Amer 64 (*) >90 mL/min   Anion gap 9  5 - 15  URINALYSIS, ROUTINE W REFLEX MICROSCOPIC      Result Value Ref Range   Color, Urine YELLOW  YELLOW   APPearance CLOUDY (*) CLEAR   Specific Gravity, Urine 1.010  1.005 - 1.030   pH 7.0  5.0 - 8.0   Glucose, UA NEGATIVE  NEGATIVE mg/dL   Hgb urine dipstick TRACE (*) NEGATIVE   Bilirubin Urine NEGATIVE  NEGATIVE   Ketones, ur NEGATIVE  NEGATIVE mg/dL   Protein, ur NEGATIVE  NEGATIVE mg/dL   Urobilinogen, UA 0.2  0.0 - 1.0 mg/dL   Nitrite NEGATIVE  NEGATIVE   Leukocytes, UA MODERATE (*) NEGATIVE  TROPONIN I      Result Value Ref Range   Troponin I <0.30  <0.30 ng/mL  PRO B NATRIURETIC PEPTIDE      Result Value Ref Range   Pro B Natriuretic peptide (BNP) 472.2 (*) 0 - 450 pg/mL  LACTIC ACID, PLASMA      Result Value Ref Range   Lactic Acid, Venous 1.4  0.5 - 2.2 mmol/L  URINE MICROSCOPIC-ADD ON      Result Value Ref Range   Squamous Epithelial / LPF RARE  RARE   WBC, UA 11-20  <3 WBC/hpf   RBC / HPF 0-2  <3 RBC/hpf   Bacteria, UA FEW (*) RARE   Laboratory interpretation all normal except possible UTI, otherwise labs are unchanged   Imaging Review  Ct Head Wo Contrast  09/05/2013    CLINICAL DATA:  Several day history of progressive weakness  EXAM: CT HEAD WITHOUT CONTRAST  TECHNIQUE: Contiguous axial images were obtained from the base of the skull through the vertex without intravenous contrast.  COMPARISON:  Prior head CT 07/30/2012; prior brain MRI 08/02/2012  FINDINGS: Negative for acute intracranial hemorrhage,  acute infarction, mass, mass effect, hydrocephalus or midline shift. Gray-white differentiation is preserved throughout. A stable appearance of cerebral atrophy and moderately extensive periventricular, subcortical and white matter hypoattenuation consistent with longstanding microvascular ischemic white matter disease. No focal soft tissue or calvarial abnormality. Normal aeration of the mastoid air cells and visualized paranasal sinuses. Atherosclerotic calcification in the bilateral cavernous carotid arteries.  IMPRESSION: 1. No acute intracranial abnormality. 2. Stable cerebral atrophy and chronic ischemic microvascular white matter changes.   Electronically Signed   By: Jacqulynn Cadet M.D.   On: 09/05/2013 12:33   Mr Brain Wo Contrast  09/05/2013   CLINICAL DATA:  Increasing weakness over the last few days.  EXAM: MRI HEAD WITHOUT CONTRAST  TECHNIQUE: Multiplanar, multiecho pulse sequences of the brain and surrounding structures were obtained without intravenous contrast.  COMPARISON:  Head CT same day  FINDINGS: Diffusion imaging does not show any acute or subacute infarction. There chronic small vessel changes throughout the pons. 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 The cerebral hemispheres show generalized atrophy with old small vessel ischemic changes affecting the thalami and basal ganglia and present throughout the deep and subcortical white matter. No cortical or large vessel territory infarction. No mass lesion, hemorrhage, hydrocephalus or extra-axial collection. No pituitary mass. No inflammatory sinus disease. Major vessels at the base of the  brain show flow.  IMPRESSION: No acute infarction. Extensive and widespread chronic small-vessel ischemic changes throughout the brain.   Electronically Signed   By: Nelson Chimes M.D.   On: 09/05/2013 15:04   Dg Chest Portable 1 View  09/05/2013   CLINICAL DATA:  Weakness.  EXAM: PORTABLE CHEST - 1 VIEW  COMPARISON:  01/26/2013  FINDINGS: Cardiac silhouette is normal in size. Normal mediastinal and hilar contours.  Subtle nodule projects over the inferior right lung, most likely a nipple shadow. Recommend followup flexed chest radiograph with nipple markers on a nonemergent basis.  Lungs are mildly hyperexpanded but otherwise clear. No pleural effusion or pneumothorax.  The bony thorax is intact.  IMPRESSION: 1. No acute cardiopulmonary disease. 2. Small nodular opacity at the right lung base most likely a nipple shadow. See above recommendation.   Electronically Signed   By: Lajean Manes M.D.   On: 09/05/2013 12:30          EKG Interpretation   Date/Time:  Wednesday September 05 2013 11:30:06 EDT Ventricular Rate:  57 PR Interval:  103 QRS Duration: 102 QT Interval:  444 QTC Calculation: 432 R Axis:   -17 Text Interpretation:  Sinus or ectopic atrial rhythm bradycardia Since  last tracing 26 Jan 2013 Atrial premature complexes Short PR interval  Borderline left axis deviation Confirmed by Meredyth Hornung  MD-I, Amante Fomby ([id number]) on  09/05/2013 11:44:53 AM      MDM   Final diagnoses:  Weakness  Urinary tract infection associated with catheterization of urinary tract, initial encounter    New Prescriptions   SULFAMETHOXAZOLE-TRIMETHOPRIM (BACTRIM DS) 800-160 MG PER TABLET    Take 1 tablet by mouth 2 (two) times daily.    Plan discharge   Rolland Porter, MD, Alanson Aly, MD 09/05/13 [place], MD 09/05/13 [phone]

## 2013-09-05 NOTE — ED Notes (Signed)
PTAR at bedside 

## 2013-09-06 ENCOUNTER — Other Ambulatory Visit: Payer: Self-pay | Admitting: Internal Medicine

## 2013-09-06 ENCOUNTER — Telehealth: Payer: Self-pay | Admitting: Internal Medicine

## 2013-09-06 DIAGNOSIS — R2689 Other abnormalities of gait and mobility: Secondary | ICD-10-CM

## 2013-09-06 DIAGNOSIS — R296 Repeated falls: Secondary | ICD-10-CM

## 2013-09-06 NOTE — Telephone Encounter (Signed)
Notified pt wife with md response. She stated that wasn't the question she wanted to ask. Pt went to ER yesterday they did a lot of testing which everything was ok. MD there suggested that PT could help with his weakness/falling. Pt is wanting to get an order for PT they have the service were they live @ [facility].../lmb

## 2013-09-06 NOTE — Telephone Encounter (Signed)
Patient's wife called stating that her husband's needs a referral because her keeps falling. Please advise.

## 2013-09-06 NOTE — Telephone Encounter (Signed)
He needs to contact his Neurologist, ? Dr Kandee Keen treats him for this

## 2013-09-07 ENCOUNTER — Emergency Department (HOSPITAL_COMMUNITY)
Admission: EM | Admit: 2013-09-07 | Discharge: 2013-09-07 | Disposition: A | Discharge status: Home / Self Care | Payer: Medicare Other | Attending: Emergency Medicine | Admitting: Emergency Medicine

## 2013-09-07 ENCOUNTER — Encounter (HOSPITAL_COMMUNITY): Payer: Self-pay | Admitting: Emergency Medicine

## 2013-09-07 DIAGNOSIS — Z8619 Personal history of other infectious and parasitic diseases: Secondary | ICD-10-CM | POA: Insufficient documentation

## 2013-09-07 DIAGNOSIS — Z91040 Latex allergy status: Secondary | ICD-10-CM | POA: Insufficient documentation

## 2013-09-07 DIAGNOSIS — R5381 Other malaise: Secondary | ICD-10-CM | POA: Diagnosis present

## 2013-09-07 DIAGNOSIS — L89109 Pressure ulcer of unspecified part of back, unspecified stage: Secondary | ICD-10-CM | POA: Insufficient documentation

## 2013-09-07 DIAGNOSIS — Z862 Personal history of diseases of the blood and blood-forming organs and certain disorders involving the immune mechanism: Secondary | ICD-10-CM | POA: Diagnosis not present

## 2013-09-07 DIAGNOSIS — Z9181 History of falling: Secondary | ICD-10-CM | POA: Insufficient documentation

## 2013-09-07 DIAGNOSIS — Z87891 Personal history of nicotine dependence: Secondary | ICD-10-CM | POA: Diagnosis not present

## 2013-09-07 DIAGNOSIS — F329 Major depressive disorder, single episode, unspecified: Secondary | ICD-10-CM | POA: Insufficient documentation

## 2013-09-07 DIAGNOSIS — IMO0002 Reserved for concepts with insufficient information to code with codable children: Secondary | ICD-10-CM | POA: Diagnosis not present

## 2013-09-07 DIAGNOSIS — F3289 Other specified depressive episodes: Secondary | ICD-10-CM | POA: Diagnosis not present

## 2013-09-07 DIAGNOSIS — E291 Testicular hypofunction: Secondary | ICD-10-CM | POA: Diagnosis not present

## 2013-09-07 DIAGNOSIS — R5383 Other fatigue: Secondary | ICD-10-CM

## 2013-09-07 DIAGNOSIS — Z792 Long term (current) use of antibiotics: Secondary | ICD-10-CM | POA: Insufficient documentation

## 2013-09-07 DIAGNOSIS — N390 Urinary tract infection, site not specified: Secondary | ICD-10-CM | POA: Diagnosis not present

## 2013-09-07 DIAGNOSIS — Z79899 Other long term (current) drug therapy: Secondary | ICD-10-CM | POA: Diagnosis not present

## 2013-09-07 DIAGNOSIS — Z8719 Personal history of other diseases of the digestive system: Secondary | ICD-10-CM | POA: Insufficient documentation

## 2013-09-07 DIAGNOSIS — Z8669 Personal history of other diseases of the nervous system and sense organs: Secondary | ICD-10-CM | POA: Diagnosis not present

## 2013-09-07 DIAGNOSIS — E785 Hyperlipidemia, unspecified: Secondary | ICD-10-CM | POA: Insufficient documentation

## 2013-09-07 DIAGNOSIS — R531 Weakness: Secondary | ICD-10-CM

## 2013-09-07 DIAGNOSIS — Z7982 Long term (current) use of aspirin: Secondary | ICD-10-CM | POA: Diagnosis not present

## 2013-09-07 DIAGNOSIS — E039 Hypothyroidism, unspecified: Secondary | ICD-10-CM | POA: Insufficient documentation

## 2013-09-07 DIAGNOSIS — R296 Repeated falls: Secondary | ICD-10-CM

## 2013-09-07 DIAGNOSIS — L8992 Pressure ulcer of unspecified site, stage 2: Secondary | ICD-10-CM | POA: Insufficient documentation

## 2013-09-07 LAB — CBC WITH DIFFERENTIAL/PLATELET
Basophils Absolute: 0 10*3/uL (ref 0.0–0.1)
Basophils Relative: 0 % (ref 0–1)
Eosinophils Absolute: 0.2 10*3/uL (ref 0.0–0.7)
Eosinophils Relative: 3 % (ref 0–5)
HCT: 44.5 % (ref 39.0–52.0)
Hemoglobin: 14.2 g/dL (ref 13.0–17.0)
Lymphocytes Relative: 20 % (ref 12–46)
Lymphs Abs: 1 10*3/uL (ref 0.7–4.0)
MCH: 29.5 pg (ref 26.0–34.0)
MCHC: 31.9 g/dL (ref 30.0–36.0)
MCV: 92.5 fL (ref 78.0–100.0)
Monocytes Absolute: 0.4 10*3/uL (ref 0.1–1.0)
Monocytes Relative: 9 % (ref 3–12)
Neutro Abs: 3.3 10*3/uL (ref 1.7–7.7)
Neutrophils Relative %: 68 % (ref 43–77)
Platelets: 152 10*3/uL (ref 150–400)
RBC: 4.81 MIL/uL (ref 4.22–5.81)
RDW: 13.3 % (ref 11.5–15.5)
WBC: 5 10*3/uL (ref 4.0–10.5)

## 2013-09-07 LAB — I-STAT CG4 LACTIC ACID, ED: LACTIC ACID, VENOUS: 0.59 mmol/L (ref 0.5–2.2)

## 2013-09-07 LAB — URINE CULTURE
Colony Count: >=100000
Special Requests: NORMAL

## 2013-09-07 LAB — COMPREHENSIVE METABOLIC PANEL
ALBUMIN: 3.2 g/dL — AB (ref 3.5–5.2)
ALK PHOS: 53 U/L (ref 39–117)
ALT: 26 U/L (ref 0–53)
AST: 29 U/L (ref 0–37)
Anion gap: 10 (ref 5–15)
BILIRUBIN TOTAL: 0.3 mg/dL (ref 0.3–1.2)
BUN: 38 mg/dL — ABNORMAL HIGH (ref 6–23)
CHLORIDE: 105 meq/L (ref 96–112)
CO2: 25 mEq/L (ref 19–32)
Calcium: 9 mg/dL (ref 8.4–10.5)
Creatinine, Ser: 1.4 mg/dL — ABNORMAL HIGH (ref 0.50–1.35)
GFR calc Af Amer: 54 mL/min — ABNORMAL LOW (ref >90)
GFR calc non Af Amer: 46 mL/min — ABNORMAL LOW (ref >90)
Glucose, Bld: 85 mg/dL (ref 70–99)
Potassium: 4.2 mEq/L (ref 3.7–5.3)
SODIUM: 140 meq/L (ref 137–147)
TOTAL PROTEIN: 6.4 g/dL (ref 6.0–8.3)

## 2013-09-07 LAB — URINALYSIS, ROUTINE W REFLEX MICROSCOPIC
Bilirubin Urine: NEGATIVE
GLUCOSE, UA: NEGATIVE mg/dL
Hgb urine dipstick: NEGATIVE
KETONES UR: NEGATIVE mg/dL
NITRITE: NEGATIVE
PH: 6.5 (ref 5.0–8.0)
Protein, ur: NEGATIVE mg/dL
Specific Gravity, Urine: 1.02 (ref 1.005–1.030)
Urobilinogen, UA: 0.2 mg/dL (ref 0.0–1.0)

## 2013-09-07 LAB — I-STAT TROPONIN, ED: Troponin i, poc: 0.01 ng/mL (ref 0.00–0.08)

## 2013-09-07 LAB — URINE MICROSCOPIC-ADD ON

## 2013-09-07 MED ORDER — SULFAMETHOXAZOLE-TMP DS 800-160 MG PO TABS
1.0000 | ORAL_TABLET | Freq: Once | ORAL | Status: AC
  Administered 2013-09-07: 1 via ORAL
  Filled 2013-09-07: qty 1

## 2013-09-07 NOTE — Progress Notes (Signed)
  CARE MANAGEMENT ED NOTE 09/07/2013  Patient:  ALEXIUS, HANGARTNER   Account Number:  [id number]  Date Initiated:  09/07/2013  Documentation initiated by:  Jackelyn Poling  Subjective/Objective Assessment:   80 yr old a[employer] medicare complete [place] pt In [place] ED due to pt's generalized weakness and lethargy, also pt was recently treated in ED for UTI  From [facility]  2 [facility] ED visits     Subjective/Objective Assessment Detail:   Spouse at bedside states pt has been seen by Dr Linna Darner but he is retiring and their plans are to finish paper work to see Dr Jeanmarie Hubert Refused listed of [place] complete providers  Spouse agreed with CM requesting an order from [place] for HHPT/RN initiation of services     Action/Plan:   ED CM Referred to pt by ED SW/EDP. ED CM reviewed chart review notes, labs in [facility]  ED Cm spoke with pt/spouse, offered list of providers, updated EPIC pcp- [facility] [place], neurologist [place]   Action/Plan Detail:   ED CM spoke with EDP [place] to discuss pt is obtaining a new pcp and wife agreed to EDP initiating [place] RN/PT orders to be sent back to [place] to get services started   Anticipated DC Date:  09/07/2013     Status Recommendation to Physician:   Result of Recommendation:    Other ED Services  Consult Working Plan   In-house referral  Clinical Social Worker   DC Forensic scientist  Other  Outpatient Services - Pt will follow up  PCP issues  CM consult   [facility] Choice  HOME HEALTH   Choice offered to / List presented to:  C-3 Spouse     HH arranged  HH-1 RN  HH-2 PT       Status of service:  Completed, signed off  ED Comments:   ED Comments Detail:   09/07/13 1415 PT consult entered, verbal referral to Amedeo Kinsman, [place], ED RN, spouse (retired [place]) & ED SW updated. Home health orders with face to face given to ED RN to send with pt at d/c back to [facility] ED CM spoke with Amedeo Kinsman, PT/OT supervisor, ED SW about Helene Kelp  request for change of level of care. Ivin Booty states she is available via pager if services are needed prior to 12 Signing off to ED SW 1327 ED CM spoke with Helene Kelp at [facility] who confirms that the initiated HHPT order should be sent back with the pt to [facility] also discussed the need for change

## 2013-09-07 NOTE — ED Provider Notes (Signed)
CSN: [id number]     Arrival date & time 09/07/13  1058 History   First MD Initiated Contact with Patient 09/07/13 1123     Chief Complaint  Patient presents with  . Weakness  . Fatigue     (Consider location/radiation/quality/duration/timing/severity/associated sxs/prior Treatment) HPI Comments: Pt is a 80 y.o. male who presents with continued generalized weakness with last mechanical fall 2 days ago, but hx of same with inc weakness for about 1 year. Seen for same on 7/22, dx with UTI, but has not been compliant w/ Rx for bactrim which was given. Denies focal weakness, h/a, neck pain, CP, ab pain, fever, chills, n/v, d/a.    Patient is a 80 y.o. male presenting with weakness. The history is provided by the patient and the spouse. No language interpreter was used.  Weakness This is a chronic problem. The current episode started more than 1 week ago (1 year, worse past 1 week). The problem occurs constantly. The problem has been gradually worsening. Pertinent negatives include no chest pain, no abdominal pain, no headaches and no shortness of breath. Exacerbated by: at end of day. Relieved by: better in the morning. He has tried nothing for the symptoms. The treatment provided no relief.    Past Medical History  Diagnosis Date  . Migraines     Dr Jannifer Franklin  . Peripheral neuropathy   . Benign essential tremor   . Cervical stenosis of spine   . Anemia     B12 deficiency  . Testosterone deficiency     Dr Lawerance Bach, [facility]  . Hypothyroidism   . Hyperlipidemia   . Depression     Dr Albertine Patricia  . History of shingles 2009  . H/O cardiovascular stress test     a. 02/2003 -> no ischemia/infarct.  Marland Kitchen Hx of echocardiogram     Echocardiogram 08/01/12: Mild LVH, EF 55-60%, normal wall motion.  . Sinus bradycardia   . PAD (peripheral artery disease)   . Carotid stenosis     s/p L CEA  . Fecal impaction 10/11/2012  . Macular edema   . Cervical spinal stenosis    Past Surgical History  Procedure  Laterality Date  . Appendectomy  1941  . Lumbar laminectomy    . Carotid endarterectomy       L  . Tonsillectomy and adenoidectomy    . Total hip arthroplasty      L  . Cataract extraction Right   . Eye surgery Right 02/2012    retina  . Inguinal hernia repair Bilateral 01/16/2013    Procedure: LAPAROSCOPIC BILATERAL INGUINAL DIRECT AND INDIRECT, FEMORAL, AND OBTURATOR HERNIAS;  Surgeon: Adin Hector, MD;  Location: [place];  Service: General;  Laterality: Bilateral;  . Insertion of mesh N/A 01/16/2013    Procedure: INSERTION OF MESH;  Surgeon: Adin Hector, MD;  Location: [place];  Service: General;  Laterality: N/A;  . Breast cyst excision Left 01/16/2013    Procedure: MASS EXCISION AXILLA;  Surgeon: Adin Hector, MD;  Location: [facility] OR;  Service: General;  Laterality: Left;   Family History  Problem Relation Age of Onset  . Stroke Mother 97  . Hypertension Mother   . Diabetes Mother   . Heart disease Mother   . Rectal cancer Father 36  . Heart disease Father     in 36s  . Cancer Father     colorectal  . Nephritis Sister   . Seizures Brother     died as child  History  Substance Use Topics  . Smoking status: Former Smoker -- 10 years    Types: Pipe, Cigars    Quit date: 02/16/1979  . Smokeless tobacco: Never Used     Comment: Quit about age 89   . Alcohol Use: 4.2 oz/week    7 Glasses of wine per week     Comment:      Review of Systems  Constitutional: Negative for fever, activity change, appetite change and fatigue.  HENT: Negative for congestion, facial swelling, rhinorrhea and trouble swallowing.   Eyes: Negative for photophobia and pain.  Respiratory: Negative for cough, chest tightness and shortness of breath.   Cardiovascular: Negative for chest pain and leg swelling.  Gastrointestinal: Negative for nausea, vomiting, abdominal pain, diarrhea and constipation.  Endocrine: Negative for polydipsia and polyuria.  Genitourinary: Negative for dysuria, urgency,  decreased urine volume and difficulty urinating.  Musculoskeletal: Negative for back pain and gait problem.  Skin: Positive for wound (sacral). Negative for color change and rash.  Allergic/Immunologic: Negative for immunocompromised state.  Neurological: Positive for weakness. Negative for dizziness, facial asymmetry, speech difficulty, numbness and headaches.  Psychiatric/Behavioral: Negative for confusion, decreased concentration and agitation.      Allergies  Latex and Tape  Home Medications   Prior to Admission medications   Medication Sig Start Date End Date Taking? Authorizing Provider  aspirin 81 MG tablet Take 81 mg by mouth every morning.    Yes Historical Provider, MD  atorvastatin (LIPITOR) 20 MG tablet Take 20 mg by mouth every evening.    Yes Historical Provider, MD  B Complex-C (B-COMPLEX WITH VITAMIN C) tablet Take 1 tablet by mouth every morning.   Yes Historical Provider, MD  bimatoprost (LUMIGAN) 0.01 % SOLN Place 1 drop into both eyes at bedtime.   Yes Historical Provider, MD  brimonidine (ALPHAGAN) 0.15 % ophthalmic solution Place 1 drop into the right eye 2 (two) times daily. 02/16/13  Yes Historical Provider, MD  buPROPion (WELLBUTRIN XL) 300 MG 24 hr tablet Take 300 mg by mouth every morning.    Yes Historical Provider, MD  cholecalciferol (VITAMIN D) 1000 UNITS tablet Take 2,000 Units by mouth 2 (two) times daily.    Yes Historical Provider, MD  clonazePAM (KLONOPIN) 0.5 MG tablet Take 0.25-0.5 mg by mouth 3 (three) times daily.  01/03/13  Yes Historical Provider, MD  desoximetasone (TOPICORT) 0.05 % cream Apply 1 application topically 2 (two) times daily. Mixes with Luzu   Yes Historical Provider, MD  Flaxseed, Linseed, (FLAX SEEDS PO) Take 1 tablet by mouth every morning.    Yes Historical Provider, MD  fluconazole (DIFLUCAN) 150 MG tablet Take 150 mg by mouth every other day. 08/30/13  Yes Historical Provider, MD  Javier Docker Oil 500 MG CAPS Take 500 mg by mouth every  morning.    Yes Historical Provider, MD  L-Methylfolate 7.5 MG TABS Take 7.5 mg by mouth daily.   Yes Historical Provider, MD  levothyroxine (SYNTHROID, LEVOTHROID) 50 MCG tablet Take 0.5-1 tablets (25-50 mcg total) by mouth every morning. Take 1/2 tab (36mcg) on Sundays, 1 tab (78mcg) on all other days. 06/27/13  Yes Hendricks Limes, MD  Linaclotide [facility]) 290 MCG CAPS capsule Take 290 mcg by mouth daily.   Yes Historical Provider, MD  Luliconazole (LUZU) 1 % CREA Apply 1 application topically 2 (two) times daily. Mixes with Topicort   Yes Historical Provider, MD  mirtazapine (REMERON) 45 MG tablet Take 45 mg by mouth at bedtime.  Yes Historical Provider, MD  Multiple Vitamins-Minerals (ICAPS AREDS FORMULA PO) Take 1 capsule by mouth daily.    Yes Historical Provider, MD  polyethylene glycol (MIRALAX / GLYCOLAX) packet Take 17 g by mouth daily as needed for mild constipation.   Yes Historical Provider, MD  sennosides-docusate sodium (SENOKOT-S) 8.6-50 MG tablet Take 1 tablet by mouth 2 (two) times daily.   Yes Historical Provider, MD  sertraline (ZOLOFT) 100 MG tablet Take 200 mg by mouth every evening.    Yes Historical Provider, MD  sulfamethoxazole-trimethoprim (BACTRIM DS) 800-160 MG per tablet Take 1 tablet by mouth 2 (two) times daily. 09/05/13  Yes Janice Norrie, MD  testosterone ([name]) 50 MG/5GM GEL Place 5 g onto the skin every morning.    Yes Historical Provider, MD  topiramate (TOPAMAX) 25 MG tablet Take 75 mg by mouth daily.   Yes Historical Provider, MD  vitamin B-12 (CYANOCOBALAMIN) 1000 MCG tablet Take 1,000 mcg by mouth every morning.    Yes Historical Provider, MD   BP 145/57  Pulse 53  Temp(Src) 97.6 F (36.4 C) (Oral)  Resp 18  Ht 6\' 1"  (1.854 m)  Wt 153 lb (69.4 kg)  BMI 20.19 kg/m2  SpO2 96% Physical Exam  Constitutional: He is oriented to person, place, and time. He appears well-developed and well-nourished. No distress.  HENT:  Head: Normocephalic and atraumatic.   Mouth/Throat: No oropharyngeal exudate.  Eyes: Pupils are equal, round, and reactive to light.  Neck: Normal range of motion. Neck supple.  Cardiovascular: Normal rate, regular rhythm and normal heart sounds.  Exam reveals no gallop and no friction rub.   No murmur heard. Pulmonary/Chest: Effort normal and breath sounds normal. No respiratory distress. He has no wheezes. He has no rales.  Abdominal: Soft. Bowel sounds are normal. He exhibits no distension and no mass. There is no tenderness. There is no rebound and no guarding.  Musculoskeletal: Normal range of motion. He exhibits no edema and no tenderness.       Back:  Neurological: He is alert and oriented to person, place, and time. He displays no tremor. No cranial nerve deficit or sensory deficit. Coordination and gait normal. GCS eye subscore is 4. GCS verbal subscore is 5. GCS motor subscore is 6.  Generalized weakness  Skin: Skin is warm and dry.  Psychiatric: He has a normal mood and affect.    ED Course  Procedures (including critical care time) Labs Review Labs Reviewed  COMPREHENSIVE METABOLIC PANEL - Abnormal; Notable for the following:    BUN 38 (*)    Creatinine, Ser 1.40 (*)    Albumin 3.2 (*)    GFR calc non Af Amer 46 (*)    GFR calc Af Amer 54 (*)    All other components within normal limits  URINALYSIS, ROUTINE W REFLEX MICROSCOPIC - Abnormal; Notable for the following:    Leukocytes, UA SMALL (*)    All other components within normal limits  URINE CULTURE  CBC WITH DIFFERENTIAL  URINE MICROSCOPIC-ADD ON  I-STAT TROPOININ, ED  I-STAT CG4 LACTIC ACID, ED    Imaging Review No results found.   EKG Interpretation None      MDM   Final diagnoses:  Generalized weakness  Recurrent falls  UTI (lower urinary tract infection)   Pt is a 80 y.o. male with Pmhx as above who presents with continued generalized weakness with last mechanical fall 2 days ago, but hx of same with inc weakness for about 1 year.  Seen for same on 7/22, dx with UTI, but has not been compliant w/ Rx for bactrim which was given. Denies focal weakness, h/a, neck pain, CP, ab pain, fever, chills, n/v, d/a.  On PE, VSS pt in NAD.  No focal neuro findings, though he is generally weak. He has stage 2 decubitus ulcer. [place] and [place] interested in moving to the SNF for PT/OT. SW and CM will consult.   PT has evaluated the PT in ED, believes SNF appropriate. UTI partially treated. He will need to continue BID bactrim. No acute findings on CBC, CMP. I believe SNF transfer appropriate and he can continue UTI treatment there. Return precautions given for new or worsening symptoms including fever, inability to tolerate PO.           Neta Ehlers, MD 09/07/13 [phone]

## 2013-09-07 NOTE — Evaluation (Signed)
Physical Therapy Evaluation Patient Details Name: Jose Leach MRN: [id number] DOB: [birth year] Today's Date: 09/07/2013   History of Present Illness  Pt presented to ED with increased weakness and wife's inability to help him at their independent apartment , pt also with recent ED visit and UTI.   Clinical Impression  Pt wot benefit from further PT to increase strength, safety and ability to walk independently with RW in his apartment.     Follow Up Recommendations SNF    Equipment Recommendations  None recommended by PT    Recommendations for Other Services       Precautions / Restrictions        Mobility  Bed Mobility Overal bed mobility: Needs Assistance Bed Mobility: Supine to Sit;Sit to Supine     Supine to sit: Mod assist;HOB elevated Sit to supine: Mod assist;HOB elevated   General bed mobility comments: very stiff and slow to move. improved with cues and wife interested in learning more of these so she can assist pt with cueing as well.   Transfers Overall transfer level: Needs assistance Equipment used: Rolling walker (2 wheeled) Transfers: Sit to/from Stand Sit to Stand: Min assist         General transfer comment: cue for RW safety  Ambulation/Gait Ambulation/Gait assistance: Min assist Ambulation Distance (Feet): 40 Feet Assistive device: Rolling walker (2 wheeled) Gait Pattern/deviations: Decreased step length - left;Decreased step length - right;Shuffle;Narrow base of support Gait velocity: very slow   General Gait Details: smal BOS, shffled, diffulty turning and initiating . cue for bigger steps and turning walker.   Stairs            Wheelchair Mobility    Modified Rankin (Stroke Patients Only)       Balance                                             Pertinent Vitals/Pain Some pain in R ear and in buttocks area (being treated per pt and wife)     Roslyn expects to be discharged  to:: Private residence (independent living apartment with wife at [facility] [place] ) Living Arrangements: Spouse/significant other (wife) Available Help at Discharge: Family Type of Home: Independent living facility Home Access: Level entry     Home Layout: One level Home Equipment: Environmental consultant - 4 wheels;Shower seat;Grab bars - toilet;Grab bars - tub/shower;Hand held shower head;Other (comment) Additional Comments: wife feels pt is weak and fears she cannot help him at home, needs increased level of care for rehab in hopes for him to eventually return to their apartment. He was independent and doing well about 2 weeks ago.     Prior Function Level of Independence: Needs assistance (was independent with rollator about 2 weeks ago, but not lately)   Gait / Transfers Assistance Needed: A with bed mobility. Has lift chair.   ADL's / Homemaking Assistance Needed: A with toileting, especially clothing management        Hand Dominance        Extremity/Trunk Assessment               Lower Extremity Assessment: Generalized weakness (pretty stiff , slow to move extremities)         Communication   Communication: No difficulties  Cognition Arousal/Alertness: Awake/alert Behavior During Therapy: WFL for tasks assessed/performed Overall Cognitive Status: Within Functional Limits  for tasks assessed (just takes time to repsond)                      General Comments      Exercises        Assessment/Plan    PT Assessment Patient needs continued PT services  PT Diagnosis Difficulty walking   PT Problem List Decreased strength;Decreased range of motion;Decreased activity tolerance;Decreased balance;Decreased mobility;Decreased safety awareness  PT Treatment Interventions DME instruction;Gait training;Functional mobility training;Therapeutic activities;Therapeutic exercise;Patient/family education   PT Goals (Current goals can be found in the Care Plan section) Acute  Rehab PT Goals Patient Stated Goal: to be stronger and able to walk like I was a few weeks ago.  PT Goal Formulation: With patient Time For Goal Achievement: 09/21/13 Potential to Achieve Goals: Good    Frequency Min 3X/week   Barriers to discharge        Co-evaluation               End of Session Equipment Utilized During Treatment: Gait belt Activity Tolerance: Patient tolerated treatment well Patient left: in bed;with family/visitor present Nurse Communication: Mobility status (to case manager)    Functional Assessment Tool Used: clincal judgement Functional Limitation: Mobility: Walking and moving around Mobility: Walking and Moving Around Current Status [phone]): At least 1 percent but less than 20 percent impaired, limited or restricted Mobility: Walking and Moving Around Goal Status [phone]): 0 percent impaired, limited or restricted    Time: 1500-1530 PT Time Calculation (min): 30 min   Charges:   PT Evaluation $Initial PT Evaluation Tier I: 1 Procedure PT Treatments $Gait Training: 8-22 mins   PT G Codes:   Functional Assessment Tool Used: clincal judgement Functional Limitation: Mobility: Walking and moving around    [place], [facility] 09/07/2013, 4:06 PM Clide Dales, PT Pager: [phone] 09/07/2013

## 2013-09-07 NOTE — Progress Notes (Addendum)
CSW completed FL-2 and uploaded all information to carefinder[employer].  CSW received a call from [place] requesting the information to be faxed to [phone] because she will be leaving for the day soon.  CSW received a call from the [facility] and she requested some information to be faxed in order for her to complete the authorization to [phone].  Wynell Balloon reports that the contact persons will be [place] and Coralyn Mark once she leave at [phone].  All information was faxed.     Chesley Noon, MSW, [place], 09/07/2013 Evening Clinical Social Worker [phone]

## 2013-09-07 NOTE — ED Notes (Signed)
Provided pt with [place]

## 2013-09-07 NOTE — Progress Notes (Signed)
CSW called Jose Leach to coordinate change in level of care. Spoke with Jose Leach. Jose Leach states facility needs the following:  PT consult FL2 [place] H&P Med list  These forms need to be faxed to [phone]. Jose Leach can be reached at [phone] or her emergency line, [phone].   CSW to continue to follow.  Jose Leach,     ED CSW  phone: [phone] 1:56pm

## 2013-09-07 NOTE — ED Notes (Signed)
RN Lanelle Bal @ bedside drawing blood and starting IV. Explained to pt we needed a Urine sample. Provided Urinal and asked him to notify when he was able to void.

## 2013-09-07 NOTE — Progress Notes (Signed)
09/07/2013 A. Britian Jentz RNCM 1925pm  EDCM asked to faxed MAR report to RN Ysuingnie at [facility] at fax number [phone].  [facility] faxed MAR report at 1914pm with confirmation of receipt at 1915pm.  EDCM called Ysuingnie at [phone] who confirmed he has received the fax.  No further EDCM needs at this time.

## 2013-09-07 NOTE — ED Notes (Signed)
Ambulated pt to restroom on [address] for BM

## 2013-09-07 NOTE — Discharge Instructions (Signed)
Urinary Tract Infection Urinary tract infections (UTIs) can develop anywhere along your urinary tract. Your urinary tract is your body's drainage system for removing wastes and extra water. Your urinary tract includes two kidneys, two ureters, a bladder, and a urethra. Your kidneys are a pair of bean-shaped organs. Each kidney is about the size of your fist. They are located below your ribs, one on each side of your spine. CAUSES Infections are caused by microbes, which are microscopic organisms, including fungi, viruses, and bacteria. These organisms are so small that they can only be seen through a microscope. Bacteria are the microbes that most commonly cause UTIs. SYMPTOMS  Symptoms of UTIs may vary by age and gender of the patient and by the location of the infection. Symptoms in young women typically include a frequent and intense urge to urinate and a painful, burning feeling in the bladder or urethra during urination. Older women and men are more likely to be tired, shaky, and weak and have muscle aches and abdominal pain. A fever may mean the infection is in your kidneys. Other symptoms of a kidney infection include pain in your back or sides below the ribs, nausea, and vomiting. DIAGNOSIS To diagnose a UTI, your caregiver will ask you about your symptoms. Your caregiver also will ask to provide a urine sample. The urine sample will be tested for bacteria and white blood cells. White blood cells are made by your body to help fight infection. TREATMENT  Typically, UTIs can be treated with medication. Because most UTIs are caused by a bacterial infection, they usually can be treated with the use of antibiotics. The choice of antibiotic and length of treatment depend on your symptoms and the type of bacteria causing your infection. HOME CARE INSTRUCTIONS  If you were prescribed antibiotics, take them exactly as your caregiver instructs you. Finish the medication even if you feel better after you  have only taken some of the medication.  Drink enough water and fluids to keep your urine clear or pale yellow.  Avoid caffeine, tea, and carbonated beverages. They tend to irritate your bladder.  Empty your bladder often. Avoid holding urine for long periods of time.  Empty your bladder before and after sexual intercourse.  After a bowel movement, women should cleanse from front to back. Use each tissue only once. SEEK MEDICAL CARE IF:   You have back pain.  You develop a fever.  Your symptoms do not begin to resolve within 3 days. SEEK IMMEDIATE MEDICAL CARE IF:   You have severe back pain or lower abdominal pain.  You develop chills.  You have nausea or vomiting.  You have continued burning or discomfort with urination. MAKE SURE YOU:   Understand these instructions.  Will watch your condition.  Will get help right away if you are not doing well or get worse. Document Released: 11/11/2004 Document Revised: 08/03/2011 Document Reviewed: 03/12/2011 [facility] Patient Information 2015 [place], [place]. This information is not intended to replace advice given to you by your health care provider. Make sure you discuss any questions you have with your health care provider.  Weakness Weakness is a lack of strength. It may be felt all over the body (generalized) or in one specific part of the body (focal). Some causes of weakness can be serious. You may need further medical evaluation, especially if you are elderly or you have a history of immunosuppression (such as chemotherapy or HIV), kidney disease, heart disease, or diabetes. CAUSES  Weakness can be  caused by many different things, including:  Infection.  Physical exhaustion.  Internal bleeding or other blood loss that results in a lack of red blood cells (anemia).  Dehydration. This cause is more common in elderly people.  Side effects or electrolyte abnormalities from medicines, such as pain medicines or  sedatives.  Emotional distress, anxiety, or depression.  Circulation problems, especially severe peripheral arterial disease.  Heart disease, such as rapid atrial fibrillation, bradycardia, or heart failure.  Nervous system disorders, such as Guillain-Barr syndrome, multiple sclerosis, or stroke. DIAGNOSIS  To find the cause of your weakness, your caregiver will take your history and perform a physical exam. Lab tests or X-rays may also be ordered, if needed. TREATMENT  Treatment of weakness depends on the cause of your symptoms and can vary greatly. HOME CARE INSTRUCTIONS   Rest as needed.  Eat a well-balanced diet.  Try to get some exercise every day.  Only take over-the-counter or prescription medicines as directed by your caregiver. SEEK MEDICAL CARE IF:   Your weakness seems to be getting worse or spreads to other parts of your body.  You develop new aches or pains. SEEK IMMEDIATE MEDICAL CARE IF:   You cannot perform your normal daily activities, such as getting dressed and feeding yourself.  You cannot walk up and down stairs, or you feel exhausted when you do so.  You have shortness of breath or chest pain.  You have difficulty moving parts of your body.  You have weakness in only one area of the body or on only one side of the body.  You have a fever.  You have trouble speaking or swallowing.  You cannot control your bladder or bowel movements.  You have black or bloody vomit or stools. MAKE SURE YOU:  Understand these instructions.  Will watch your condition.  Will get help right away if you are not doing well or get worse. Document Released: 02/01/2005 Document Revised: 08/03/2011 Document Reviewed: 04/02/2011 [facility] Patient Information 2015 [place], [place]. This information is not intended to replace advice given to you by your health care provider. Make sure you discuss any questions you have with your health care provider.  Fall Prevention and  Home Safety Falls cause injuries and can affect all age groups. It is possible to prevent falls.  HOW TO PREVENT FALLS  Wear shoes with rubber soles that do not have an opening for your toes.  Keep the inside and outside of your house well lit.  Use night lights throughout your home.  Remove clutter from floors.  Clean up floor spills.  Remove throw rugs or fasten them to the floor with carpet tape.  Do not place electrical cords across pathways.  Put grab bars by your tub, shower, and toilet. Do not use towel bars as grab bars.  Put handrails on both sides of the stairway. Fix loose handrails.  Do not climb on stools or stepladders, if possible.  Do not wax your floors.  Repair uneven or unsafe sidewalks, walkways, or stairs.  Keep items you use a lot within reach.  Be aware of pets.  Keep emergency numbers next to the telephone.  Put smoke detectors in your home and near bedrooms. Ask your doctor what other things you can do to prevent falls. Document Released: 11/28/2008 Document Revised: 08/03/2011 Document Reviewed: 05/04/2011 [facility] Patient Information 2015 [place], [place]. This information is not intended to replace advice given to you by your health care provider. Make sure you discuss any questions  you have with your health care provider. ° ° ° °

## 2013-09-07 NOTE — ED Notes (Signed)
Per EMS pt's wife called for [facility] to transport pt to ED due to pt's generalized weakness and lethargy, also pt was recently treated in ED for UTI

## 2013-09-08 ENCOUNTER — Telehealth (HOSPITAL_BASED_OUTPATIENT_CLINIC_OR_DEPARTMENT_OTHER): Payer: Self-pay | Admitting: Emergency Medicine

## 2013-09-08 LAB — URINE CULTURE
Colony Count: NO GROWTH
Culture: NO GROWTH

## 2013-09-08 NOTE — Telephone Encounter (Signed)
Post ED Visit - Positive Culture Follow-up  Culture report reviewed by antimicrobial stewardship pharmacist: []  Wes [place], Pharm.D., BCPS []  Heide Guile, Pharm.D., BCPS []  Alycia Rossetti, Pharm.D., BCPS []  [place], Pharm.D., BCPS, AAHIVP [x]  Legrand Como, Pharm.D., BCPS, AAHIVP  Positive urine culture Treated with Sulfa-Trimeth, organism sensitive to the same and no further patient follow-up is required at this time.  Matthew Saras, Rex Kras 09/08/2013, 11:58 AM

## 2013-09-09 ENCOUNTER — Emergency Department (HOSPITAL_COMMUNITY): Payer: Medicare Other

## 2013-09-09 ENCOUNTER — Encounter (HOSPITAL_COMMUNITY): Payer: Self-pay | Admitting: Emergency Medicine

## 2013-09-09 ENCOUNTER — Observation Stay (HOSPITAL_COMMUNITY)
Admission: EM | Admit: 2013-09-09 | Discharge: 2013-09-11 | Disposition: A | Discharge status: Skilled Nursing Facility | Payer: Medicare Other | Attending: Internal Medicine | Admitting: Internal Medicine

## 2013-09-09 DIAGNOSIS — E039 Hypothyroidism, unspecified: Secondary | ICD-10-CM | POA: Diagnosis not present

## 2013-09-09 DIAGNOSIS — K458 Other specified abdominal hernia without obstruction or gangrene: Secondary | ICD-10-CM

## 2013-09-09 DIAGNOSIS — E785 Hyperlipidemia, unspecified: Secondary | ICD-10-CM

## 2013-09-09 DIAGNOSIS — F329 Major depressive disorder, single episode, unspecified: Secondary | ICD-10-CM | POA: Diagnosis not present

## 2013-09-09 DIAGNOSIS — G609 Hereditary and idiopathic neuropathy, unspecified: Secondary | ICD-10-CM | POA: Diagnosis not present

## 2013-09-09 DIAGNOSIS — F3289 Other specified depressive episodes: Secondary | ICD-10-CM | POA: Insufficient documentation

## 2013-09-09 DIAGNOSIS — R4182 Altered mental status, unspecified: Secondary | ICD-10-CM

## 2013-09-09 DIAGNOSIS — I6529 Occlusion and stenosis of unspecified carotid artery: Secondary | ICD-10-CM | POA: Diagnosis not present

## 2013-09-09 DIAGNOSIS — Z860100 Personal history of colon polyps, unspecified: Secondary | ICD-10-CM

## 2013-09-09 DIAGNOSIS — N390 Urinary tract infection, site not specified: Secondary | ICD-10-CM

## 2013-09-09 DIAGNOSIS — R262 Difficulty in walking, not elsewhere classified: Secondary | ICD-10-CM

## 2013-09-09 DIAGNOSIS — Z87891 Personal history of nicotine dependence: Secondary | ICD-10-CM | POA: Insufficient documentation

## 2013-09-09 DIAGNOSIS — Z96649 Presence of unspecified artificial hip joint: Secondary | ICD-10-CM | POA: Insufficient documentation

## 2013-09-09 DIAGNOSIS — R197 Diarrhea, unspecified: Secondary | ICD-10-CM

## 2013-09-09 DIAGNOSIS — I951 Orthostatic hypotension: Principal | ICD-10-CM

## 2013-09-09 DIAGNOSIS — R338 Other retention of urine: Secondary | ICD-10-CM

## 2013-09-09 DIAGNOSIS — K209 Esophagitis, unspecified without bleeding: Secondary | ICD-10-CM

## 2013-09-09 DIAGNOSIS — R001 Bradycardia, unspecified: Secondary | ICD-10-CM

## 2013-09-09 DIAGNOSIS — R531 Weakness: Secondary | ICD-10-CM

## 2013-09-09 DIAGNOSIS — S7000XA Contusion of unspecified hip, initial encounter: Secondary | ICD-10-CM | POA: Insufficient documentation

## 2013-09-09 DIAGNOSIS — R2689 Other abnormalities of gait and mobility: Secondary | ICD-10-CM

## 2013-09-09 DIAGNOSIS — R5381 Other malaise: Secondary | ICD-10-CM | POA: Insufficient documentation

## 2013-09-09 DIAGNOSIS — E559 Vitamin D deficiency, unspecified: Secondary | ICD-10-CM

## 2013-09-09 DIAGNOSIS — W19XXXA Unspecified fall, initial encounter: Secondary | ICD-10-CM | POA: Diagnosis not present

## 2013-09-09 DIAGNOSIS — N281 Cyst of kidney, acquired: Secondary | ICD-10-CM

## 2013-09-09 DIAGNOSIS — R600 Localized edema: Secondary | ICD-10-CM

## 2013-09-09 DIAGNOSIS — R9431 Abnormal electrocardiogram [ECG] [EKG]: Secondary | ICD-10-CM

## 2013-09-09 DIAGNOSIS — G250 Essential tremor: Secondary | ICD-10-CM

## 2013-09-09 DIAGNOSIS — K5909 Other constipation: Secondary | ICD-10-CM

## 2013-09-09 DIAGNOSIS — E869 Volume depletion, unspecified: Secondary | ICD-10-CM

## 2013-09-09 DIAGNOSIS — S7001XA Contusion of right hip, initial encounter: Secondary | ICD-10-CM

## 2013-09-09 DIAGNOSIS — G43909 Migraine, unspecified, not intractable, without status migrainosus: Secondary | ICD-10-CM

## 2013-09-09 DIAGNOSIS — I10 Essential (primary) hypertension: Secondary | ICD-10-CM

## 2013-09-09 DIAGNOSIS — Z7982 Long term (current) use of aspirin: Secondary | ICD-10-CM | POA: Diagnosis not present

## 2013-09-09 DIAGNOSIS — Q742 Other congenital malformations of lower limb(s), including pelvic girdle: Secondary | ICD-10-CM

## 2013-09-09 DIAGNOSIS — R2232 Localized swelling, mass and lump, left upper limb: Secondary | ICD-10-CM

## 2013-09-09 DIAGNOSIS — I498 Other specified cardiac arrhythmias: Secondary | ICD-10-CM | POA: Insufficient documentation

## 2013-09-09 DIAGNOSIS — R5383 Other fatigue: Secondary | ICD-10-CM | POA: Diagnosis present

## 2013-09-09 DIAGNOSIS — E782 Mixed hyperlipidemia: Secondary | ICD-10-CM

## 2013-09-09 DIAGNOSIS — M4800 Spinal stenosis, site unspecified: Secondary | ICD-10-CM

## 2013-09-09 DIAGNOSIS — N179 Acute kidney failure, unspecified: Secondary | ICD-10-CM

## 2013-09-09 DIAGNOSIS — I739 Peripheral vascular disease, unspecified: Secondary | ICD-10-CM

## 2013-09-09 DIAGNOSIS — G252 Other specified forms of tremor: Secondary | ICD-10-CM

## 2013-09-09 DIAGNOSIS — Z9181 History of falling: Secondary | ICD-10-CM

## 2013-09-09 DIAGNOSIS — F411 Generalized anxiety disorder: Secondary | ICD-10-CM

## 2013-09-09 DIAGNOSIS — Z800 Family history of malignant neoplasm of digestive organs: Secondary | ICD-10-CM

## 2013-09-09 DIAGNOSIS — M129 Arthropathy, unspecified: Secondary | ICD-10-CM

## 2013-09-09 DIAGNOSIS — I491 Atrial premature depolarization: Secondary | ICD-10-CM

## 2013-09-09 HISTORY — DX: Contusion of right hip, initial encounter: S70.01XA

## 2013-09-09 HISTORY — DX: Altered mental status, unspecified: R41.82

## 2013-09-09 LAB — COMPREHENSIVE METABOLIC PANEL
ALK PHOS: 53 U/L (ref 39–117)
ALT: 31 U/L (ref 0–53)
ANION GAP: 14 (ref 5–15)
AST: 29 U/L (ref 0–37)
Albumin: 3 g/dL — ABNORMAL LOW (ref 3.5–5.2)
BUN: 38 mg/dL — AB (ref 6–23)
CO2: 20 meq/L (ref 19–32)
Calcium: 8.5 mg/dL (ref 8.4–10.5)
Chloride: 107 mEq/L (ref 96–112)
Creatinine, Ser: 1.29 mg/dL (ref 0.50–1.35)
GFR, EST AFRICAN AMERICAN: 59 mL/min — AB (ref >90)
GFR, EST NON AFRICAN AMERICAN: 51 mL/min — AB (ref >90)
GLUCOSE: 79 mg/dL (ref 70–99)
Potassium: 4.7 mEq/L (ref 3.7–5.3)
SODIUM: 141 meq/L (ref 137–147)
Total Bilirubin: <0.2 mg/dL — ABNORMAL LOW (ref 0.3–1.2)
Total Protein: 6.1 g/dL (ref 6.0–8.3)

## 2013-09-09 LAB — URINALYSIS, ROUTINE W REFLEX MICROSCOPIC
Bilirubin Urine: NEGATIVE
Glucose, UA: NEGATIVE mg/dL
HGB URINE DIPSTICK: NEGATIVE
Ketones, ur: NEGATIVE mg/dL
Leukocytes, UA: NEGATIVE
Nitrite: NEGATIVE
Protein, ur: NEGATIVE mg/dL
Specific Gravity, Urine: 1.021 (ref 1.005–1.030)
UROBILINOGEN UA: 0.2 mg/dL (ref 0.0–1.0)
pH: 5 (ref 5.0–8.0)

## 2013-09-09 LAB — CBC
HEMATOCRIT: 42.6 % (ref 39.0–52.0)
HEMOGLOBIN: 13.8 g/dL (ref 13.0–17.0)
MCH: 30.3 pg (ref 26.0–34.0)
MCHC: 32.4 g/dL (ref 30.0–36.0)
MCV: 93.4 fL (ref 78.0–100.0)
Platelets: 165 10*3/uL (ref 150–400)
RBC: 4.56 MIL/uL (ref 4.22–5.81)
RDW: 13.6 % (ref 11.5–15.5)
WBC: 5.7 10*3/uL (ref 4.0–10.5)

## 2013-09-09 LAB — I-STAT CG4 LACTIC ACID, ED: Lactic Acid, Venous: 1.58 mmol/L (ref 0.5–2.2)

## 2013-09-09 LAB — TROPONIN I

## 2013-09-09 LAB — AMMONIA: Ammonia: 18 umol/L (ref 11–60)

## 2013-09-09 MED ORDER — SODIUM CHLORIDE 0.9 % IV BOLUS (SEPSIS)
500.0000 mL | Freq: Once | INTRAVENOUS | Status: AC
  Administered 2013-09-09: 500 mL via INTRAVENOUS

## 2013-09-09 NOTE — ED Notes (Signed)
Family at bedside. 

## 2013-09-09 NOTE — ED Notes (Signed)
Pt in xray

## 2013-09-09 NOTE — H&P (Addendum)
PCP:  Estill Dooms, MD    Chief Complaint:  Generalized weakness, confusion  HPI: Jose Leach is a 80 y.o. male   has a past medical history of Migraines; Peripheral neuropathy; Benign essential tremor; Cervical stenosis of spine; Anemia; Testosterone deficiency; Hypothyroidism; Hyperlipidemia; Depression; History of shingles (2009); H/O cardiovascular stress test; echocardiogram; Sinus bradycardia; PAD (peripheral artery disease); Carotid stenosis; Fecal impaction (10/11/2012); Macular edema; and Cervical spinal stenosis.   Presented with  Patient has been residing with his wife at independent living up until 3 days ago. Patient has hx of peripheral neuropathy and trouble with balance requiring a walker for the past 2 years.  1 week ago he fell while in the bathroom x2 he continued to be progressively week. He has not been drinking well at the facility due to intermittent confusion.  4 days ago family took him to [facility] ER and he was rehydrated had extensive work up done including MRI that was [employer]. He was noted to have a UTI and was started on Bactrim. Patietn was discharged to independent living but fell again. Family arranged for him to be transferred to SNF where he has been for past 3 days. He had an other fall this afternoon but he is not sure how it happened family thinks he slid out of the chair. He landed on right hip and had a large swelling. SNF send him to ER for further eval. Imaging showed no evidence of fracture or dislocation but patient was found to have a hematoma of the right hip. He has been intermittently confused which is new for him. Family thinks his klonapin has been recently adjusted.   Hospitalist was called for admission for altered mental status  Review of Systems:    Pertinent positives include:  Frequent falls, debility, fatigue  Constitutional:  No weight loss, night sweats, Fevers, chills, fatigue, weight loss  HEENT:  No headaches, Difficulty  swallowing,Tooth/dental problems,Sore throat,  No sneezing, itching, ear ache, nasal congestion, post nasal drip,  Cardio-vascular:  No chest pain, Orthopnea, PND, anasarca, dizziness, palpitations.no Bilateral lower extremity swelling  GI:  No heartburn, indigestion, abdominal pain, nausea, vomiting, diarrhea, change in bowel habits, loss of appetite, melena, blood in stool, hematemesis Resp:  no shortness of breath at rest. No dyspnea on exertion, No excess mucus, no productive cough, No non-productive cough, No coughing up of blood.No change in color of mucus.No wheezing. Skin:  no rash or lesions. No jaundice GU:  no dysuria, change in color of urine, no urgency or frequency. No straining to urinate.  No flank pain.  Musculoskeletal:  No joint pain or no joint swelling. No decreased range of motion. No back pain.  Psych:  No change in mood or affect. No depression or anxiety. No memory loss.  Neuro: no localizing neurological complaints, no tingling, no weakness, no double vision, no gait abnormality, no slurred speech, no confusion  Otherwise ROS are negative except for above, 10 systems were reviewed  Past Medical History: Past Medical History  Diagnosis Date  . Migraines     Dr Jannifer Franklin  . Peripheral neuropathy   . Benign essential tremor   . Cervical stenosis of spine   . Anemia     B12 deficiency  . Testosterone deficiency     Dr Lawerance Bach, [facility]  . Hypothyroidism   . Hyperlipidemia   . Depression     Dr Albertine Patricia  . History of shingles 2009  . H/O cardiovascular stress test  a. 02/2003 -> no ischemia/infarct.  Marland Kitchen Hx of echocardiogram     Echocardiogram 08/01/12: Mild LVH, EF 55-60%, normal wall motion.  . Sinus bradycardia   . PAD (peripheral artery disease)   . Carotid stenosis     s/p L CEA  . Fecal impaction 10/11/2012  . Macular edema   . Cervical spinal stenosis    Past Surgical History  Procedure Laterality Date  . Appendectomy  1941  . Lumbar  laminectomy    . Carotid endarterectomy       L  . Tonsillectomy and adenoidectomy    . Total hip arthroplasty      L  . Cataract extraction Right   . Eye surgery Right 02/2012    retina  . Inguinal hernia repair Bilateral 01/16/2013    Procedure: LAPAROSCOPIC BILATERAL INGUINAL DIRECT AND INDIRECT, FEMORAL, AND OBTURATOR HERNIAS;  Surgeon: Adin Hector, MD;  Location: [place];  Service: General;  Laterality: Bilateral;  . Insertion of mesh N/A 01/16/2013    Procedure: INSERTION OF MESH;  Surgeon: Adin Hector, MD;  Location: [place];  Service: General;  Laterality: N/A;  . Breast cyst excision Left 01/16/2013    Procedure: MASS EXCISION AXILLA;  Surgeon: Adin Hector, MD;  Location: [place];  Service: General;  Laterality: Left;     Medications: Prior to Admission medications   Medication Sig Start Date End Date Taking? Authorizing Provider  aspirin 81 MG tablet Take 81 mg by mouth every morning.    Yes Historical Provider, MD  atorvastatin (LIPITOR) 20 MG tablet Take 20 mg by mouth every evening.    Yes Historical Provider, MD  B Complex-C (B-COMPLEX WITH VITAMIN C) tablet Take 1 tablet by mouth every morning.   Yes Historical Provider, MD  bimatoprost (LUMIGAN) 0.01 % SOLN Place 1 drop into both eyes at bedtime.   Yes Historical Provider, MD  brimonidine (ALPHAGAN) 0.15 % ophthalmic solution Place 1 drop into the right eye 2 (two) times daily. 02/16/13  Yes Historical Provider, MD  buPROPion (WELLBUTRIN XL) 300 MG 24 hr tablet Take 300 mg by mouth every morning.    Yes Historical Provider, MD  cholecalciferol (VITAMIN D) 1000 UNITS tablet Take 2,000 Units by mouth 2 (two) times daily.    Yes Historical Provider, MD  clonazePAM (KLONOPIN) 0.5 MG tablet Take 0.5 mg by mouth 3 (three) times daily.  01/03/13  Yes Historical Provider, MD  Flaxseed, Linseed, (FLAX SEEDS PO) Take 1 tablet by mouth every morning.    Yes Historical Provider, MD  Javier Docker Oil 500 MG CAPS Take 500 mg by mouth every  morning.    Yes Historical Provider, MD  L-Methylfolate 7.5 MG TABS Take 7.5 mg by mouth daily.   Yes Historical Provider, MD  levothyroxine (SYNTHROID, LEVOTHROID) 50 MCG tablet Take 0.5-1 tablets (25-50 mcg total) by mouth every morning. Take 1/2 tab (7mcg) on Sundays, 1 tab (25mcg) on all other days. 06/27/13  Yes Hendricks Limes, MD  Linaclotide [facility]) 290 MCG CAPS capsule Take 290 mcg by mouth daily.   Yes Historical Provider, MD  mirtazapine (REMERON) 45 MG tablet Take 45 mg by mouth at bedtime.   Yes Historical Provider, MD  Multiple Vitamins-Minerals (ICAPS AREDS FORMULA PO) Take 1 capsule by mouth daily.    Yes Historical Provider, MD  polyethylene glycol (MIRALAX / GLYCOLAX) packet Take 17 g by mouth daily as needed for mild constipation.   Yes Historical Provider, MD  sennosides-docusate sodium (SENOKOT-S) 8.6-50 MG  tablet Take 2 tablets by mouth 2 (two) times daily.    Yes Historical Provider, MD  sertraline (ZOLOFT) 100 MG tablet Take 200 mg by mouth every evening.    Yes Historical Provider, MD  sulfamethoxazole-trimethoprim (BACTRIM DS) 800-160 MG per tablet Take 1 tablet by mouth 2 (two) times daily. 09/05/13  Yes Janice Norrie, MD  testosterone ([name]) 50 MG/5GM GEL Place 5 g onto the skin every morning.    Yes Historical Provider, MD  topiramate (TOPAMAX) 25 MG tablet Take 75 mg by mouth daily.   Yes Historical Provider, MD  vitamin B-12 (CYANOCOBALAMIN) 1000 MCG tablet Take 1,000 mcg by mouth every morning.    Yes Historical Provider, MD    Allergies:   Allergies  Allergen Reactions  . Latex Rash  . Tape Rash    Paper tape is ok to use     Social History:  Ambulatory with  walker cane  From facility [facility]   reports that he quit smoking about 34 years ago. His smoking use included Pipe and Cigars. He has never used smokeless tobacco. He reports that he drinks about 4.2 ounces of alcohol per week. He reports that he does not use illicit drugs.     Family History: family history includes Cancer in his father; Diabetes in his mother; Heart disease in his father and mother; Hypertension in his mother; Nephritis in his sister; Rectal cancer (age of onset: 49) in his father; Seizures in his brother; Stroke (age of onset: 4) in his mother.    Physical Exam: Patient Vitals for the past 24 hrs:  BP Temp Temp src Pulse Resp SpO2 Height Weight  09/09/13 1930 135/75 mmHg - - 60 22 97 % - -  09/09/13 1900 137/64 mmHg - - 25 15 98 % - -  09/09/13 1856 153/68 mmHg - - 60 21 96 % - -  09/09/13 1815 136/93 mmHg - - 66 20 99 % - -  09/09/13 1800 144/60 mmHg - - 63 16 99 % - -  09/09/13 1747 131/78 mmHg 98 F (36.7 C) Oral - 13 98 % 6\' 1"  (1.854 m) 70.308 kg (155 lb)    1. General:  in No Acute distress 2. Psychological: Alert and  Oriented 3. Head/ENT:     Dry Mucous Membranes                          Head Non traumatic, neck supple                          Normal  Dentition 4. SKIN:   decreased Skin turgor,  Skin clean Dry and intact no rash 5. Heart: Regular rate and rhythm no Murmur, Rub or gallop 6. Lungs: Clear to auscultation bilaterally, no wheezes or crackles   7. Abdomen: Soft, non-tender, Non distended 8. Lower extremities: no clubbing, cyanosis, mild peripheral edema, cool to he touch 9. Neurologically strength 5/5 in all 4 ext. CN 2-12 intact.  10. MSK: Normal range of motion  body mass index is 20.45 kg/(m^2).   Labs on Admission:   Recent Labs  09/07/13 1209 09/09/13 1807  NA 140 141  K 4.2 4.7  CL 105 107  CO2 25 20  GLUCOSE 85 79  BUN 38* 38*  CREATININE 1.40* 1.29  CALCIUM 9.0 8.5    Recent Labs  09/07/13 1209 09/09/13 1807  AST 29 29  ALT  26 31  ALKPHOS 53 53  BILITOT 0.3 <0.2*  PROT 6.4 6.1  ALBUMIN 3.2* 3.0*   No results found for this basename: LIPASE, AMYLASE,  in the last 72 hours  Recent Labs  09/07/13 1209 09/09/13 1807  WBC 5.0 5.7  NEUTROABS 3.3  --   HGB 14.2 13.8  HCT 44.5  42.6  MCV 92.5 93.4  PLT 152 165   No results found for this basename: CKTOTAL, CKMB, CKMBINDEX, TROPONINI,  in the last 72 hours No results found for this basename: TSH, T4TOTAL, FREET3, T3FREE, THYROIDAB,  in the last 72 hours No results found for this basename: VITAMINB12, FOLATE, FERRITIN, TIBC, IRON, RETICCTPCT,  in the last 72 hours Lab Results  Component Value Date   HGBA1C 5.3 07/31/2012    Estimated Creatinine Clearance: 46.2 ml/min (by C-G formula based on Cr of 1.29). ABG    Component Value Date/Time   TCO2 26 11/11/2011 0028     Lab Results  Component Value Date   DDIMER 0.82* 09/13/2012     Other results:  I have pearsonaly reviewed this: ECG REPORT  Rate: 57  Rhythm: Possible ectopic atrial rhythm with PAC, no sig change from prior ST&T Change: no ischemia   UA no evidence of UTI  BNP (last 3 results)  Recent Labs  09/05/13 1206  PROBNP 472.2*    Filed Weights   09/09/13 1747  Weight: 70.308 kg (155 lb)     Cultures:    Component Value Date/Time   SDES URINE, CLEAN CATCH 09/07/2013 1219   SPECREQUEST NONE 09/07/2013 1219   CULT  Value: NO GROWTH Performed at [facility] 09/07/2013 1219   REPTSTATUS 09/08/2013 FINAL 09/07/2013 1219      Radiological Exams on Admission: Dg Chest 2 View  09/09/2013   CLINICAL DATA:  Weakness.  Fatigue.  EXAM: CHEST  2 VIEW  COMPARISON:  Chest x-ray 09/05/2013.  FINDINGS: Mild elevation of the right hemidiaphragm. No consolidative airspace disease. No pleural effusions. No evidence of pulmonary edema. Heart size is normal. Upper mediastinal contours are within normal limits.  IMPRESSION: 1. Mild elevation of the right hemidiaphragm is new compared to the prior study, but is of uncertain the etiology and significance.   Electronically Signed   By: Vinnie Langton M.D.   On: 09/09/2013 18:44   Dg Pelvis 1-2 Views  09/09/2013   CLINICAL DATA:  Right hip pain secondary to a fall.  EXAM: PELVIS - 1-2 VIEW   COMPARISON:  Radiograph dated 01/26/2013  FINDINGS: There is no fracture or dislocation or diastases. There is moderately severe arthritis of the right hip with chondrocalcinosis. Left hip prosthesis appears unchanged. Pelvic bones are intact.  IMPRESSION: No acute abnormality. Moderately severe arthritis of the right hip with chondrocalcinosis.   Electronically Signed   By: Rozetta Nunnery M.D.   On: 09/09/2013 18:45   Ct Head Wo Contrast  09/09/2013   CLINICAL DATA:  Two falls in the last week.  EXAM: CT HEAD WITHOUT CONTRAST  TECHNIQUE: Contiguous axial images were obtained from the base of the skull through the vertex without intravenous contrast.  COMPARISON:  CT scan dated 09/05/2013.  MRI dated 09/05/2013  FINDINGS: No mass lesion. No midline shift. No acute hemorrhage or hematoma. No extra-axial fluid collections. No evidence of acute infarction. There is diffuse cerebral cortical atrophy with secondary ventricular dilatation. There are old tiny lacunar infarcts in the periventricular white matter.  No osseous abnormality.  IMPRESSION: No acute intracranial abnormality.  Chronic atrophy and chronic small vessel ischemic disease with tiny lacunar infarcts, stable.   Electronically Signed   By: Rozetta Nunnery M.D.   On: 09/09/2013 20:01    Chart has been reviewed  Assessment/Plan  80 yo M with hx of HTM , PVD, carotid artery stenosis and synus bradycardia followed by Dr. Burt Knack here with volume depletion and debility with frequent falls.   Present on Admission:  . Weakness - possibly multifactorial, patient is debilitated and slightly dehydrated, recovering from recent UTI poor PO intake of fluids.  given gentle IVF, PT OT eval, fall precautions, adjust klonapin decrease dose to 0.25 twice a day and 0.5 at night.  . Volume depletion - give gentle IVF and check orthostatics . HTN (hypertension) - continue to monitor . UTI (lower urinary tract infection) - continue septra . Altered mental status -  likley mutifactoria given advance age will cycle CE, give IVF, adjust klonapin Bradycardia - will obtain echo, check TSH could be contributing to weakness. Have been seen for this in the past by cardiology. Avoid betablockers Right Hip hematoma - will monitor, repeat CBC in AM. Monitor for signs of infection Prophylaxis:  scd, Protonix  CODE STATUS:  FULL CODE    Other plan as per orders.  I have spent a total of 55 min on this admission  Tamyrah Burbage 09/09/2013, 8:24 PM  [facility]  Pager [phone]   If 7AM-7PM, please contact the day team taking care of the patient  [email or web address]  Password [email or web address]

## 2013-09-09 NOTE — ED Notes (Signed)
Lactic Acid 1.58 mmol/L given to Dr. Mingo Amber

## 2013-09-09 NOTE — ED Notes (Signed)
MD at bedside. 

## 2013-09-09 NOTE — ED Notes (Signed)
Pt has bruising and hematoma to right upper hip. Dr. Mingo Amber at bedside assessing hip.

## 2013-09-09 NOTE — ED Notes (Signed)
Pt fell at noon today as he was getting out of wheelchair. Pt fell to tile floor. Pt's right hip has bruised and obvious deformity. Pt was hospitalized Sunday - Wed of last week for fall. No LOC pt did not hit his head on this current fall. Pain in hip 2-4/10. Pt is coming from [facility] and being treated for UTI. BP 144/68, HR 60, CBG 127. EKG appears Afib for EMS which would be new onset. Pt is alert and oriented.

## 2013-09-09 NOTE — Progress Notes (Signed)
Called ED for report on patient.

## 2013-09-09 NOTE — ED Notes (Signed)
Patient given meal bag.

## 2013-09-09 NOTE — ED Provider Notes (Signed)
CSN: [id number]     Arrival date & time 09/09/13  1729 History   First MD Initiated Contact with Patient 09/09/13 1732     Chief Complaint  Patient presents with  . Fall     (Consider location/radiation/quality/duration/timing/severity/associated sxs/prior Treatment) Patient is a 80 y.o. male presenting with fall. The history is provided by the patient.  Fall This is a new problem. The current episode started 3 to 5 hours ago. Episode frequency: once. The problem has not changed since onset.Pertinent negatives include no chest pain, no abdominal pain, no headaches and no shortness of breath. Nothing aggravates the symptoms. Nothing relieves the symptoms.    Past Medical History  Diagnosis Date  . Migraines     Dr Jannifer Franklin  . Peripheral neuropathy   . Benign essential tremor   . Cervical stenosis of spine   . Anemia     B12 deficiency  . Testosterone deficiency     Dr Lawerance Bach, [facility]  . Hypothyroidism   . Hyperlipidemia   . Depression     Dr Albertine Patricia  . History of shingles 2009  . H/O cardiovascular stress test     a. 02/2003 -> no ischemia/infarct.  Marland Kitchen Hx of echocardiogram     Echocardiogram 08/01/12: Mild LVH, EF 55-60%, normal wall motion.  . Sinus bradycardia   . PAD (peripheral artery disease)   . Carotid stenosis     s/p L CEA  . Fecal impaction 10/11/2012  . Macular edema   . Cervical spinal stenosis    Past Surgical History  Procedure Laterality Date  . Appendectomy  1941  . Lumbar laminectomy    . Carotid endarterectomy       L  . Tonsillectomy and adenoidectomy    . Total hip arthroplasty      L  . Cataract extraction Right   . Eye surgery Right 02/2012    retina  . Inguinal hernia repair Bilateral 01/16/2013    Procedure: LAPAROSCOPIC BILATERAL INGUINAL DIRECT AND INDIRECT, FEMORAL, AND OBTURATOR HERNIAS;  Surgeon: Adin Hector, MD;  Location: [place];  Service: General;  Laterality: Bilateral;  . Insertion of mesh N/A 01/16/2013    Procedure: INSERTION OF  MESH;  Surgeon: Adin Hector, MD;  Location: [place];  Service: General;  Laterality: N/A;  . Breast cyst excision Left 01/16/2013    Procedure: MASS EXCISION AXILLA;  Surgeon: Adin Hector, MD;  Location: [facility] OR;  Service: General;  Laterality: Left;   Family History  Problem Relation Age of Onset  . Stroke Mother 52  . Hypertension Mother   . Diabetes Mother   . Heart disease Mother   . Rectal cancer Father 34  . Heart disease Father     in 30s  . Cancer Father     colorectal  . Nephritis Sister   . Seizures Brother     died as child   History  Substance Use Topics  . Smoking status: Former Smoker -- 10 years    Types: Pipe, Cigars    Quit date: 02/16/1979  . Smokeless tobacco: Never Used     Comment: Quit about age 71   . Alcohol Use: 4.2 oz/week    7 Glasses of wine per week     Comment:      Review of Systems  Constitutional: Negative for fever.  Respiratory: Negative for cough and shortness of breath.   Cardiovascular: Negative for chest pain.  Gastrointestinal: Negative for vomiting and abdominal pain.  Neurological: Negative for headaches.  All other systems reviewed and are negative.     Allergies  Latex and Tape  Home Medications   Prior to Admission medications   Medication Sig Start Date End Date Taking? Authorizing Provider  aspirin 81 MG tablet Take 81 mg by mouth every morning.     Historical Provider, MD  atorvastatin (LIPITOR) 20 MG tablet Take 20 mg by mouth every evening.     Historical Provider, MD  B Complex-C (B-COMPLEX WITH VITAMIN C) tablet Take 1 tablet by mouth every morning.    Historical Provider, MD  bimatoprost (LUMIGAN) 0.01 % SOLN Place 1 drop into both eyes at bedtime.    Historical Provider, MD  brimonidine (ALPHAGAN) 0.15 % ophthalmic solution Place 1 drop into the right eye 2 (two) times daily. 02/16/13   Historical Provider, MD  buPROPion (WELLBUTRIN XL) 300 MG 24 hr tablet Take 300 mg by mouth every morning.     Historical  Provider, MD  cholecalciferol (VITAMIN D) 1000 UNITS tablet Take 2,000 Units by mouth 2 (two) times daily.     Historical Provider, MD  clonazePAM (KLONOPIN) 0.5 MG tablet Take 0.25-0.5 mg by mouth 3 (three) times daily.  01/03/13   Historical Provider, MD  desoximetasone (TOPICORT) 0.05 % cream Apply 1 application topically 2 (two) times daily. Mixes with Luzu    Historical Provider, MD  Flaxseed, Linseed, (FLAX SEEDS PO) Take 1 tablet by mouth every morning.     Historical Provider, MD  fluconazole (DIFLUCAN) 150 MG tablet Take 150 mg by mouth every other day. 08/30/13   Historical Provider, MD  Javier Docker Oil 500 MG CAPS Take 500 mg by mouth every morning.     Historical Provider, MD  L-Methylfolate 7.5 MG TABS Take 7.5 mg by mouth daily.    Historical Provider, MD  levothyroxine (SYNTHROID, LEVOTHROID) 50 MCG tablet Take 0.5-1 tablets (25-50 mcg total) by mouth every morning. Take 1/2 tab (63mcg) on Sundays, 1 tab (25mcg) on all other days. 06/27/13   Hendricks Limes, MD  Linaclotide [facility]) 290 MCG CAPS capsule Take 290 mcg by mouth daily.    Historical Provider, MD  Luliconazole (LUZU) 1 % CREA Apply 1 application topically 2 (two) times daily. Mixes with Topicort    Historical Provider, MD  mirtazapine (REMERON) 45 MG tablet Take 45 mg by mouth at bedtime.    Historical Provider, MD  Multiple Vitamins-Minerals (ICAPS AREDS FORMULA PO) Take 1 capsule by mouth daily.     Historical Provider, MD  polyethylene glycol (MIRALAX / GLYCOLAX) packet Take 17 g by mouth daily as needed for mild constipation.    Historical Provider, MD  sennosides-docusate sodium (SENOKOT-S) 8.6-50 MG tablet Take 1 tablet by mouth 2 (two) times daily.    Historical Provider, MD  sertraline (ZOLOFT) 100 MG tablet Take 200 mg by mouth every evening.     Historical Provider, MD  sulfamethoxazole-trimethoprim (BACTRIM DS) 800-160 MG per tablet Take 1 tablet by mouth 2 (two) times daily. 09/05/13   Janice Norrie, MD  testosterone  ([name]) 50 MG/5GM GEL Place 5 g onto the skin every morning.     Historical Provider, MD  topiramate (TOPAMAX) 25 MG tablet Take 75 mg by mouth daily.    Historical Provider, MD  vitamin B-12 (CYANOCOBALAMIN) 1000 MCG tablet Take 1,000 mcg by mouth every morning.     Historical Provider, MD   BP 131/78  Temp(Src) 98 F (36.7 C) (Oral)  Resp 13  Ht 6'  1" (1.854 m)  Wt 155 lb (70.308 kg)  BMI 20.45 kg/m2  SpO2 98% Physical Exam  Nursing note and vitals reviewed. Constitutional: He is oriented to person, place, and time. He appears well-developed and well-nourished. No distress.  HENT:  Head: Normocephalic and atraumatic.  Mouth/Throat: Oropharynx is clear and moist. No oropharyngeal exudate.  Eyes: EOM are normal. Pupils are equal, round, and reactive to light.  Neck: Normal range of motion. Neck supple.  Cardiovascular: Normal rate and regular rhythm.  Exam reveals no friction rub.   No murmur heard. Pulmonary/Chest: Effort normal and breath sounds normal. No respiratory distress. He has no wheezes. He has no rales.  Abdominal: Soft. He exhibits no distension. There is no tenderness. There is no rebound.  Musculoskeletal: Normal range of motion. He exhibits no edema.       Right hip: He exhibits tenderness and swelling (soft hematoma over R hip). He exhibits normal range of motion, normal strength and no deformity.  Neurological: He is alert and oriented to person, place, and time.  Skin: No rash noted. He is not diaphoretic.    ED Course  Procedures (including critical care time) Labs Review Labs Reviewed  CBC  COMPREHENSIVE METABOLIC PANEL  URINALYSIS, ROUTINE W REFLEX MICROSCOPIC  I-STAT CG4 LACTIC ACID, ED    Imaging Review No results found.   EKG Interpretation None      MDM   Final diagnoses:  Bradycardia, sinus  Difficulty in walking(719.7)  Essential hypertension  Hx of falling  Volume depletion    80 year old male today here with weakness. He fell  after getting up out of the wheelchair. He is currently on Bactrim for a UTI. Was seen 2 days ago, and in 5 days ago for similar symptoms. He was improving in the ED, but then will quickly deteriorate once back at his rehabilitation facility. He did not hit his head today. He has had some confusion and is not acting like himself per wife and son.  On exam lungs clear, belly benign. Right hip with large contusion and bruising, but normal range of motion in hip. Patient alert and answering questions, but appears ill.  Will check labs, xrays. Labs and imaging normal. Patient admitted for his continued weakness and falls.    Osvaldo Shipper, MD 09/10/13 [phone]

## 2013-09-10 ENCOUNTER — Encounter (HOSPITAL_COMMUNITY): Payer: Self-pay | Admitting: Emergency Medicine

## 2013-09-10 DIAGNOSIS — I10 Essential (primary) hypertension: Secondary | ICD-10-CM

## 2013-09-10 DIAGNOSIS — Z9181 History of falling: Secondary | ICD-10-CM

## 2013-09-10 DIAGNOSIS — R5383 Other fatigue: Secondary | ICD-10-CM

## 2013-09-10 DIAGNOSIS — R5381 Other malaise: Secondary | ICD-10-CM

## 2013-09-10 DIAGNOSIS — R29818 Other symptoms and signs involving the nervous system: Secondary | ICD-10-CM

## 2013-09-10 DIAGNOSIS — I951 Orthostatic hypotension: Principal | ICD-10-CM

## 2013-09-10 DIAGNOSIS — I498 Other specified cardiac arrhythmias: Secondary | ICD-10-CM

## 2013-09-10 LAB — COMPREHENSIVE METABOLIC PANEL
ALT: 27 U/L (ref 0–53)
AST: 23 U/L (ref 0–37)
Albumin: 2.8 g/dL — ABNORMAL LOW (ref 3.5–5.2)
Alkaline Phosphatase: 48 U/L (ref 39–117)
Anion gap: 12 (ref 5–15)
BUN: 34 mg/dL — ABNORMAL HIGH (ref 6–23)
CALCIUM: 8.3 mg/dL — AB (ref 8.4–10.5)
CO2: 20 mEq/L (ref 19–32)
Chloride: 107 mEq/L (ref 96–112)
Creatinine, Ser: 1.17 mg/dL (ref 0.50–1.35)
GFR calc Af Amer: 67 mL/min — ABNORMAL LOW (ref >90)
GFR calc non Af Amer: 57 mL/min — ABNORMAL LOW (ref >90)
Glucose, Bld: 85 mg/dL (ref 70–99)
Potassium: 4.8 mEq/L (ref 3.7–5.3)
SODIUM: 139 meq/L (ref 137–147)
TOTAL PROTEIN: 5.9 g/dL — AB (ref 6.0–8.3)
Total Bilirubin: 0.2 mg/dL — ABNORMAL LOW (ref 0.3–1.2)

## 2013-09-10 LAB — MAGNESIUM: MAGNESIUM: 1.7 mg/dL (ref 1.5–2.5)

## 2013-09-10 LAB — TROPONIN I
Troponin I: <0.3 ng/mL (ref <0.30)
Troponin I: <0.3 ng/mL (ref <0.30)

## 2013-09-10 LAB — CBC
HCT: 41.1 % (ref 39.0–52.0)
HEMOGLOBIN: 13.2 g/dL (ref 13.0–17.0)
MCH: 29.6 pg (ref 26.0–34.0)
MCHC: 32.1 g/dL (ref 30.0–36.0)
MCV: 92.2 fL (ref 78.0–100.0)
Platelets: 165 10*3/uL (ref 150–400)
RBC: 4.46 MIL/uL (ref 4.22–5.81)
RDW: 13.5 % (ref 11.5–15.5)
WBC: 7.3 10*3/uL (ref 4.0–10.5)

## 2013-09-10 LAB — MRSA PCR SCREENING: MRSA BY PCR: POSITIVE — AB

## 2013-09-10 LAB — PHOSPHORUS: Phosphorus: 2.7 mg/dL (ref 2.3–4.6)

## 2013-09-10 LAB — TSH: TSH: 4.39 u[IU]/mL (ref 0.350–4.500)

## 2013-09-10 MED ORDER — BUPROPION HCL ER (XL) 300 MG PO TB24
300.0000 mg | ORAL_TABLET | Freq: Every morning | ORAL | Status: DC
  Administered 2013-09-10 – 2013-09-11 (×2): 300 mg via ORAL
  Filled 2013-09-10 (×2): qty 1

## 2013-09-10 MED ORDER — SODIUM CHLORIDE 0.9 % IV SOLN
INTRAVENOUS | Status: AC
  Administered 2013-09-10: 01:00:00 via INTRAVENOUS

## 2013-09-10 MED ORDER — POLYETHYLENE GLYCOL 3350 17 G PO PACK
17.0000 g | PACK | Freq: Every day | ORAL | Status: DC | PRN
  Filled 2013-09-10: qty 1

## 2013-09-10 MED ORDER — LEVOTHYROXINE SODIUM 25 MCG PO TABS: 25.0000 ug | ORAL_TABLET | Freq: Every morning | ORAL | Status: DC

## 2013-09-10 MED ORDER — BOOST PLUS PO LIQD
237.0000 mL | Freq: Three times a day (TID) | ORAL | Status: DC
  Administered 2013-09-10 – 2013-09-11 (×3): 237 mL via ORAL
  Filled 2013-09-10 (×7): qty 237

## 2013-09-10 MED ORDER — DOCUSATE SODIUM 100 MG PO CAPS
100.0000 mg | ORAL_CAPSULE | Freq: Two times a day (BID) | ORAL | Status: DC
  Administered 2013-09-10 – 2013-09-11 (×3): 100 mg via ORAL
  Filled 2013-09-10 (×5): qty 1

## 2013-09-10 MED ORDER — CLONAZEPAM 0.5 MG PO TABS
0.5000 mg | ORAL_TABLET | Freq: Every day | ORAL | Status: DC
  Administered 2013-09-10: 0.5 mg via ORAL
  Filled 2013-09-10 (×2): qty 1

## 2013-09-10 MED ORDER — SENNOSIDES-DOCUSATE SODIUM 8.6-50 MG PO TABS
2.0000 | ORAL_TABLET | Freq: Two times a day (BID) | ORAL | Status: DC
  Administered 2013-09-10 – 2013-09-11 (×3): 2 via ORAL
  Filled 2013-09-10 (×3): qty 2

## 2013-09-10 MED ORDER — LEVOTHYROXINE SODIUM 25 MCG PO TABS: 25.0000 ug | ORAL_TABLET | ORAL | Status: DC

## 2013-09-10 MED ORDER — BRIMONIDINE TARTRATE 0.2 % OP SOLN
1.0000 [drp] | Freq: Two times a day (BID) | OPHTHALMIC | Status: DC
  Administered 2013-09-10 – 2013-09-11 (×3): 1 [drp] via OPHTHALMIC
  Filled 2013-09-10: qty 5

## 2013-09-10 MED ORDER — LEVOTHYROXINE SODIUM 50 MCG PO TABS
50.0000 ug | ORAL_TABLET | ORAL | Status: DC
  Administered 2013-09-10 – 2013-09-11 (×2): 50 ug via ORAL
  Filled 2013-09-10 (×3): qty 1

## 2013-09-10 MED ORDER — ASPIRIN EC 81 MG PO TBEC
81.0000 mg | DELAYED_RELEASE_TABLET | Freq: Every morning | ORAL | Status: DC
  Administered 2013-09-10 – 2013-09-11 (×2): 81 mg via ORAL
  Filled 2013-09-10 (×2): qty 1

## 2013-09-10 MED ORDER — SULFAMETHOXAZOLE-TMP DS 800-160 MG PO TABS
1.0000 | ORAL_TABLET | Freq: Two times a day (BID) | ORAL | Status: DC
  Administered 2013-09-10 – 2013-09-11 (×4): 1 via ORAL
  Filled 2013-09-10 (×5): qty 1

## 2013-09-10 MED ORDER — ONDANSETRON HCL 4 MG/2ML IJ SOLN: 4.0000 mg | Freq: Four times a day (QID) | INTRAMUSCULAR | Status: DC | PRN

## 2013-09-10 MED ORDER — CLONAZEPAM 0.5 MG PO TABS: 0.2500 mg | ORAL_TABLET | Freq: Two times a day (BID) | ORAL | Status: DC

## 2013-09-10 MED ORDER — MAGNESIUM SULFATE 40 MG/ML IJ SOLN
2.0000 g | Freq: Once | INTRAMUSCULAR | Status: AC
  Administered 2013-09-10: 2 g via INTRAVENOUS
  Filled 2013-09-10: qty 50

## 2013-09-10 MED ORDER — MUPIROCIN 2 % EX OINT
1.0000 "application " | TOPICAL_OINTMENT | Freq: Two times a day (BID) | CUTANEOUS | Status: DC
  Administered 2013-09-10 – 2013-09-11 (×3): 1 via NASAL
  Filled 2013-09-10 (×2): qty 22

## 2013-09-10 MED ORDER — TOPIRAMATE 25 MG PO TABS
75.0000 mg | ORAL_TABLET | Freq: Every day | ORAL | Status: DC
  Administered 2013-09-10 – 2013-09-11 (×2): 75 mg via ORAL
  Filled 2013-09-10 (×2): qty 3

## 2013-09-10 MED ORDER — SODIUM CHLORIDE 0.9 % IJ SOLN: 3.0000 mL | Freq: Two times a day (BID) | INTRAMUSCULAR | Status: DC

## 2013-09-10 MED ORDER — CLONAZEPAM 0.5 MG PO TABS: 0.2500 mg | ORAL_TABLET | Freq: Two times a day (BID) | ORAL | Status: DC | PRN

## 2013-09-10 MED ORDER — LATANOPROST 0.005 % OP SOLN
1.0000 [drp] | Freq: Every day | OPHTHALMIC | Status: DC
  Administered 2013-09-10 (×2): 1 [drp] via OPHTHALMIC
  Filled 2013-09-10: qty 2.5

## 2013-09-10 MED ORDER — ACETAMINOPHEN 650 MG RE SUPP: 650.0000 mg | Freq: Four times a day (QID) | RECTAL | Status: DC | PRN

## 2013-09-10 MED ORDER — CHLORHEXIDINE GLUCONATE CLOTH 2 % EX PADS
6.0000 | MEDICATED_PAD | Freq: Every day | CUTANEOUS | Status: DC
  Administered 2013-09-11: 6 via TOPICAL

## 2013-09-10 MED ORDER — CLONAZEPAM 0.5 MG PO TABS: 0.2500 mg | ORAL_TABLET | Freq: Three times a day (TID) | ORAL | Status: DC

## 2013-09-10 MED ORDER — TESTOSTERONE 50 MG/5GM (1%) TD GEL
5.0000 g | Freq: Every morning | TRANSDERMAL | Status: DC
  Filled 2013-09-10 (×2): qty 5

## 2013-09-10 MED ORDER — ATORVASTATIN CALCIUM 20 MG PO TABS
20.0000 mg | ORAL_TABLET | Freq: Every evening | ORAL | Status: DC
  Administered 2013-09-10: 20 mg via ORAL
  Filled 2013-09-10 (×2): qty 1

## 2013-09-10 MED ORDER — SERTRALINE HCL 100 MG PO TABS
200.0000 mg | ORAL_TABLET | Freq: Every evening | ORAL | Status: DC
  Administered 2013-09-10: 200 mg via ORAL
  Filled 2013-09-10 (×2): qty 2

## 2013-09-10 MED ORDER — SODIUM CHLORIDE 0.9 % IV SOLN
INTRAVENOUS | Status: DC
  Administered 2013-09-11: 04:00:00 via INTRAVENOUS

## 2013-09-10 MED ORDER — HYDROCODONE-ACETAMINOPHEN 5-325 MG PO TABS
1.0000 | ORAL_TABLET | ORAL | Status: DC | PRN
  Administered 2013-09-10: 2 via ORAL
  Administered 2013-09-11: 1 via ORAL
  Filled 2013-09-10: qty 2
  Filled 2013-09-10: qty 1

## 2013-09-10 MED ORDER — LINACLOTIDE 290 MCG PO CAPS
290.0000 ug | ORAL_CAPSULE | Freq: Every day | ORAL | Status: DC
  Administered 2013-09-10 – 2013-09-11 (×2): 290 ug via ORAL
  Filled 2013-09-10 (×2): qty 1

## 2013-09-10 MED ORDER — ACETAMINOPHEN 325 MG PO TABS: 650.0000 mg | ORAL_TABLET | Freq: Four times a day (QID) | ORAL | Status: DC | PRN

## 2013-09-10 MED ORDER — MIRTAZAPINE 45 MG PO TABS
45.0000 mg | ORAL_TABLET | Freq: Every day | ORAL | Status: DC
  Administered 2013-09-10 (×2): 45 mg via ORAL
  Filled 2013-09-10 (×3): qty 1

## 2013-09-10 MED ORDER — ONDANSETRON HCL 4 MG PO TABS: 4.0000 mg | ORAL_TABLET | Freq: Four times a day (QID) | ORAL | Status: DC | PRN

## 2013-09-10 NOTE — Evaluation (Signed)
Physical Therapy Evaluation Patient Details Name: Jose Leach MRN: [id number] DOB: 08-04-1932 Today's Date: 09/10/2013   History of Present Illness  Pt admitted with weakness, falls and volume depletion.  Pt was recently hospitalized with a UTI and discharged home with his wife to [facility] independent living.  Pt was moved to [facility] SNF just prior to readmission as wife was having difficulty managing pt at home.   Clinical Impression  Patient presents with functional limitations due to deficits listed in PT problem list below. Pt presents with generalized weakness, peripheral neuropathy and balance deficits limiting mobility/safety. Pt with hx of falls. Pt would benefit from skilled PT to maximize independence, ease burden of care and improve safe mobility so pt can safely discharge to below venue.    Follow Up Recommendations SNF;Supervision/Assistance - 24 hour    Equipment Recommendations  None recommended by PT    Recommendations for Other Services       Precautions / Restrictions Precautions Precautions: Fall Restrictions Weight Bearing Restrictions: No      Mobility  Bed Mobility Overal bed mobility: Needs Assistance Bed Mobility: Supine to Sit     Supine to sit: Supervision;HOB elevated     General bed mobility comments: Supervision for safety due to first time pt being up today.  Transfers Overall transfer level: Needs assistance Equipment used: Rolling walker (2 wheeled) Transfers: Sit to/from Stand Sit to Stand: Min assist         General transfer comment: Increased time due to low bed height with Min A.  Ambulation/Gait Ambulation/Gait assistance: Min guard Ambulation Distance (Feet): 25 Feet Assistive device: Rolling walker (2 wheeled) Gait Pattern/deviations: Step-through pattern;Decreased stance time - right;Shuffle;Trunk flexed Gait velocity: Decreased Gait velocity interpretation: Below normal speed for age/gender General Gait  Details: Shuffling noted. Difficulty clearing RLE during swing phase of gait with VC to pick up RLE for advancement. Narrow BOS.  Stairs            Wheelchair Mobility    Modified Rankin (Stroke Patients Only)       Balance Overall balance assessment: Needs assistance;History of Falls Sitting-balance support: Feet supported Sitting balance-Leahy Scale: Good     Standing balance support: Bilateral upper extremity supported;During functional activity Standing balance-Leahy Scale: Poor Standing balance comment: Able to perform hand hygiene at the sink with VC for safety with RW management. Min guard assist for safety.                             Pertinent Vitals/Pain Reports no pain today. Vitals stable throughout. No complaints of dizziness, lightheadedness during session.    Home Living Family/patient expects to be discharged to:: Skilled nursing facility Living Arrangements: Spouse/significant other Available Help at Discharge: Family Type of Home: Independent living facility Home Access: Level entry     Home Layout: One level Home Equipment: [place] - 4 wheels;Shower seat;Grab bars - toilet;Grab bars - tub/shower;Hand held shower head;Other (comment)      Prior Function Level of Independence: Needs assistance   Gait / Transfers Assistance Needed: Pt uses RW for ambulation but has multiple falls at home recently.  ADL's / Homemaking Assistance Needed: Reports assist/supervision for bathing.        Hand Dominance   Dominant Hand: Right    Extremity/Trunk Assessment   Upper Extremity Assessment: Defer to OT evaluation (Tremor noted BUEs - premorbid.)           Lower Extremity  Assessment: Generalized weakness;RLE deficits/detail;LLE deficits/detail      Cervical / Trunk Assessment: Kyphotic  Communication   Communication: No difficulties  Cognition Arousal/Alertness: Awake/alert Behavior During Therapy: WFL for tasks  assessed/performed Overall Cognitive Status: Within Functional Limits for tasks assessed                      General Comments General comments (skin integrity, edema, etc.): Large purplish/blueish ecchymosis present right hip from fall.    Exercises General Exercises - Lower Extremity Ankle Circles/Pumps: AROM;Both;10 reps Long Arc Quad: Both;5 reps;Seated;AROM      Assessment/Plan    PT Assessment Patient needs continued PT services  PT Diagnosis Difficulty walking;Generalized weakness   PT Problem List Decreased strength;Impaired sensation;Decreased activity tolerance;Decreased knowledge of use of DME;Decreased balance;Decreased safety awareness;Decreased mobility;Decreased knowledge of precautions;Decreased skin integrity  PT Treatment Interventions DME instruction;Balance training;Gait training;Functional mobility training;Patient/family education;Therapeutic activities;Therapeutic exercise   PT Goals (Current goals can be found in the Care Plan section) Acute Rehab PT Goals Patient Stated Goal: get stronger, stop falling PT Goal Formulation: With patient Time For Goal Achievement: 09/24/13 Potential to Achieve Goals: Good    Frequency Min 2X/week   Barriers to discharge Decreased caregiver support      Co-evaluation   Reason for Co-Treatment: For patient/therapist safety   OT goals addressed during session: ADL's and self-care       End of Session Equipment Utilized During Treatment: Gait belt Activity Tolerance: Patient tolerated treatment well Patient left: in chair;with call bell/phone within reach;with chair alarm set Nurse Communication: Mobility status;Precautions         Time: 2641-5830 PT Time Calculation (min): 23 min   Charges:   PT Evaluation $Initial PT Evaluation Tier I: 1 Procedure PT Treatments $Therapeutic Activity: 8-22 mins   PT G CodesCandy Sledge A 09/10/2013, 10:26 AM Candy Sledge, PT,  DPT [phone]

## 2013-09-10 NOTE — Progress Notes (Signed)
NURSING PROGRESS NOTE  Jose Leach [id number] Admission Data: 09/10/2013 6:31 AM Attending Provider: Toy Baker, MD YOV:ZCHYI, Viviann Spare, MD Code Status: full  Jose Leach is a 80 y.o. male patient admitted from ED:  -No acute distress noted.  -No complaints of shortness of breath.  -No complaints of chest pain.   Cardiac Monitoring: Box # 10 in place. Cardiac monitor yields:atrial fibrillation, with ventricular rate of 53.  Blood pressure 156/63, pulse 61, temperature 97.5 F (36.4 C), temperature source Oral, resp. rate 18, height 6\' 1"  (1.854 m), weight 66.8 kg (147 lb 4.3 oz), SpO2 96.00%.   IV Fluids:  IV in place, occlusive dsg intact without redness, IV cath forearm right, condition patent and no redness normal saline.   Allergies:  Latex and Tape  Past Medical History:   has a past medical history of Migraines; Peripheral neuropathy; Benign essential tremor; Cervical stenosis of spine; Anemia; Testosterone deficiency; Hypothyroidism; Hyperlipidemia; Depression; History of shingles (2009); H/O cardiovascular stress test; echocardiogram; Sinus bradycardia; PAD (peripheral artery disease); Carotid stenosis; Fecal impaction (10/11/2012); Macular edema; and Cervical spinal stenosis.  Past Surgical History:   has past surgical history that includes Appendectomy (1941); Lumbar laminectomy; Carotid endarterectomy; Tonsillectomy and adenoidectomy; Total hip arthroplasty; Cataract extraction (Right); Eye surgery (Right, 02/2012); Inguinal hernia repair (Bilateral, 01/16/2013); Insertion of mesh (N/A, 01/16/2013); and Breast cyst excision (Left, 01/16/2013).  Social History:   reports that he quit smoking about 34 years ago. His smoking use included Pipe and Cigars. He has never used smokeless tobacco. He reports that he drinks about 4.2 ounces of alcohol per week. He reports that he does not use illicit drugs.  Skin: redness and rash on buttocks with peeling skin.  Patient states MD told him this was yeast. Hematoma to right hip from fall. Skin tear with 2 steri strips on right forearm/wrist. Rash on face around nose and under beard.  Patient oriented to room. Information packet given to patient. Admission inpatient armband information verified with patient to include name and date of birth and placed on patient arm. Side rails up x 2, fall assessment and education completed with patient. Patient able to verbalize understanding of risk associated with falls and verbalized understanding to call for assistance before getting out of bed. Call light within reach. Patient able to voice and demonstrate understanding of unit orientation instructions.

## 2013-09-10 NOTE — Evaluation (Signed)
Occupational Therapy Evaluation Patient Details Name: Jose Leach MRN: [id number] DOB: 1932/12/11 Today's Date: 09/10/2013    History of Present Illness Pt admitted with weakness, falls and volume depletion.  Pt was recently hospitalized with a UTI and discharged home with his wife to [facility] independent living.  Pt was moved to [facility] SNF just prior to readmission as wife was having difficulty managing pt at home. PMH includes peripheral neuropathy, benign essential tremor and cervical stenosis.   Clinical Impression   Pt presents with generalized weakness and impaired balance.  He moves slowly, but with min to min guard assist and use of RW.  Pt requires min assist for ADL.  Prior to recent hospital admission he was performing at a modified independent level.  Pt will need short term rehab prior to return home with his wife.    Follow Up Recommendations  SNF;Supervision/Assistance - 24 hour    Equipment Recommendations  None recommended by OT    Recommendations for Other Services       Precautions / Restrictions Precautions Precautions: Fall      Mobility Bed Mobility Overal bed mobility: Needs Assistance Bed Mobility: Supine to Sit     Supine to sit: HOB elevated;Supervision        Transfers Overall transfer level: Needs assistance Equipment used: Rolling walker (2 wheeled) Transfers: Sit to/from Stand Sit to Stand: Min assist         General transfer comment: good technique    Balance Overall balance assessment: Needs assistance Sitting-balance support: Feet supported Sitting balance-Leahy Scale: Good     Standing balance support: During functional activity;No upper extremity supported Standing balance-Leahy Scale: Poor                              ADL Overall ADL's : Needs assistance/impaired Eating/Feeding: Independent;Sitting   Grooming: Wash/dry hands;Standing;Min guard   Upper Body Bathing: Supervision/  safety;Sitting   Lower Body Bathing: Sit to/from stand;Minimal assistance   Upper Body Dressing : Supervision/safety;Sitting   Lower Body Dressing: Sit to/from stand;Minimal assistance   Toilet Transfer: Ambulation;RW;Min guard   Toileting- Clothing Manipulation and Hygiene: Minimal assistance;Sit to/from stand       Functional mobility during ADLs: Rolling walker;Min guard General ADL Comments: Pt able to access his feet in sitting for socks.  Poor balance hindering ability to perform standing ADL.     Vision                     Perception     Praxis      Pertinent Vitals/Pain Soreness in R hip, repositioned     Hand Dominance Right   Extremity/Trunk Assessment Upper Extremity Assessment Upper Extremity Assessment: Generalized weakness   Lower Extremity Assessment Lower Extremity Assessment: Defer to PT evaluation   Cervical / Trunk Assessment Cervical / Trunk Assessment: Kyphotic   Communication Communication Communication: No difficulties   Cognition Arousal/Alertness: Awake/alert Behavior During Therapy: WFL for tasks assessed/performed Overall Cognitive Status: Within Functional Limits for tasks assessed                     General Comments       Exercises       Shoulder Instructions      Home Living Family/patient expects to be discharged to:: Skilled nursing facility  Home Equipment: [place] - 4 wheels;Shower seat;Grab bars - toilet;Grab bars - tub/shower;Hand held shower head;Other (comment) (lift chair)          Prior Functioning/Environment Level of Independence: Needs assistance (Pt was modified independent in mobility and 3 weeks ago.)             OT Diagnosis: Generalized weakness;Acute pain   OT Problem List: Decreased strength;Decreased activity tolerance;Impaired balance (sitting and/or standing);Impaired sensation;Pain   OT Treatment/Interventions: Self-care/ADL  training;Patient/family education;Balance training;Therapeutic activities    OT Goals(Current goals can be found in the care plan section) Acute Rehab OT Goals Patient Stated Goal: get stronger, stop falling OT Goal Formulation: With patient Time For Goal Achievement: 09/24/13 Potential to Achieve Goals: Good ADL Goals Pt Will Perform Grooming: with supervision;standing Pt Will Perform Lower Body Bathing: with supervision;sit to/from stand Pt Will Perform Lower Body Dressing: with supervision;sit to/from stand Pt Will Transfer to Toilet: with supervision;ambulating;regular height toilet;grab bars Pt Will Perform Toileting - Clothing Manipulation and hygiene: with supervision;sit to/from stand Pt Will Perform Tub/Shower Transfer: with min guard assist;ambulating;shower seat;Shower transfer;rolling walker  OT Frequency: Min 2X/week   Barriers to D/C: Decreased caregiver support          Co-evaluation PT/OT/SLP Co-Evaluation/Treatment: Yes Reason for Co-Treatment: For patient/therapist safety   OT goals addressed during session: ADL's and self-care      End of Session Equipment Utilized During Treatment: Gait belt;Rolling walker Nurse Communication: Mobility status  Activity Tolerance: Patient tolerated treatment well Patient left: in chair;with call bell/phone within reach;with chair alarm set   Time: [phone] OT Time Calculation (min): 24 min Charges:  OT General Charges $OT Visit: 1 Procedure OT Evaluation $Initial OT Evaluation Tier I: 1 Procedure OT Treatments $Self Care/Home Management : 8-22 mins G-Codes:    Malka So 09/10/2013, 10:07 AM [phone]

## 2013-09-10 NOTE — Consult Note (Signed)
CARDIOLOGY CONSULT NOTE   Patient ID: Jose Leach MRN: [id number], DOB/AGE: [birth year]   Admit date: 09/09/2013 Date of Consult: 09/10/2013   Primary Physician: Estill Dooms, MD Primary Cardiologist: Dr. Burt Knack  Pt. Profile  Frail 80 year old Caucasian male with past medical history of hypothyroidism, hyperlipidemia, depression, history of sinus bradycardia, PAD and long-standing history of  peripheral neuropathy presented after recurrent fall and weakness. Noted to have bradycardia into 30-40s overnight, cardiology consulted for bradycardia and weakness  Problem List  Past Medical History  Diagnosis Date  . Migraines     Dr Jannifer Franklin  . Peripheral neuropathy   . Benign essential tremor   . Cervical stenosis of spine   . Anemia     B12 deficiency  . Testosterone deficiency     Dr Lawerance Bach, [facility]  . Hypothyroidism   . Hyperlipidemia   . Depression     Dr Albertine Patricia  . History of shingles 2009  . H/O cardiovascular stress test     a. 02/2003 -> no ischemia/infarct.  Marland Kitchen Hx of echocardiogram     Echocardiogram 08/01/12: Mild LVH, EF 55-60%, normal wall motion.  . Sinus bradycardia   . PAD (peripheral artery disease)   . Carotid stenosis     s/p L CEA  . Fecal impaction 10/11/2012  . Macular edema   . Cervical spinal stenosis     Past Surgical History  Procedure Laterality Date  . Appendectomy  1941  . Lumbar laminectomy    . Carotid endarterectomy       L  . Tonsillectomy and adenoidectomy    . Total hip arthroplasty      L  . Cataract extraction Right   . Eye surgery Right 02/2012    retina  . Inguinal hernia repair Bilateral 01/16/2013    Procedure: LAPAROSCOPIC BILATERAL INGUINAL DIRECT AND INDIRECT, FEMORAL, AND OBTURATOR HERNIAS;  Surgeon: Adin Hector, MD;  Location: [place];  Service: General;  Laterality: Bilateral;  . Insertion of mesh N/A 01/16/2013    Procedure: INSERTION OF MESH;  Surgeon: Adin Hector, MD;  Location: [place];  Service: General;   Laterality: N/A;  . Breast cyst excision Left 01/16/2013    Procedure: MASS EXCISION AXILLA;  Surgeon: Adin Hector, MD;  Location: [place];  Service: General;  Laterality: Left;     Allergies  Allergies  Allergen Reactions  . Latex Rash  . Tape Rash    Paper tape is ok to use     HPI   The patient is a frail 80 year old Caucasian male with past medical history of hypothyroidism, hyperlipidemia, depression, history of sinus bradycardia, PAD and long-standing history of  peripheral neuropathy. Patient appears to have a long-standing history of weakness and recurrent mechanical fall secondary to worsening peripheral neuropathy. He was fully evaluated by our EP physician on 07/31/2012 after being admitted for weakness. At that time, patient denied any significant dizziness associated with bradycardia. Previous records show that he was seen by Dr. Albertine Patricia for progressive fatigue and weakness in 2008. He was noted to have a heart rate in the 40s back then with exercise treadmill test which was negative for ischemia and also with appropriate chronotropic response. According to Dr. Loyal Jacobson note, patient has chronic bradycardia with heart rates in the 40s, however his heart rate increased to mid 60s with exertion, therefore there was no clear indication for pacemaker at the time. Echocardiogram obtained 78/70/2014 showed EF 55-60%, no regional wall motion abnormality. According  to the family member, patient continued to have weakness worsened by any acute process. Per patient's daughter, any acute illness drains him of all of his energy. He also had recurrent fall related to imbalance. He denied any dizziness causing the falls and states he has trouble turning during walk. His last followup with Dr. Burt Knack was in May 27th. At that time, he denied any significant chest pain, palpitation, lightheadedness or syncope, there was no indication for pacemaker at the time. A followup Doppler study was  recommended for his history of carotid stenosis status post left carotid endarterectomy.  According to the family member, patient had up to 4 falls in the last week. He was living in an independent living facility with his wife prior to that. He presented to [facility] after 2 consecutive falls. It was felt that dehydration and decreased PO intake may have contributed to his symptom. Extensive workup was done including MRI which was unremarkable. He was noted to have a UTI and was started on Bactrim. He was discharged back to independent living facility, however he fell again on the same day of discharge. Family had arranged the patient to be transferred to skilled nursing facility where he had another fall on 09/09/2013. Skilled nursing facility transfer the patient to [facility] ED for further evaluation. X-ray of the pelvis showed no significant fracture. CT of brain only revealed chronic ischemic changes, however no acute process. Chest x-ray showed elevation of right hemidiaphragm, however unclear etiology. Urinalysis was negative for nitrite. Initial laboratory finding was significant for creatinine of 1.29 and albumin of 3.0. Serial troponin were negative. Lactate acid 1.58. Overnight, he was bradycardic down to the 30s and 40s. Of note, patient denies any significant dizziness associated with bradycardia. Cardiology was consulted for bradycardia and weakness.    Inpatient Medications  . aspirin EC  81 mg Oral q morning - 10a  . atorvastatin  20 mg Oral QPM  . brimonidine  1 drop Right Eye BID  . buPROPion  300 mg Oral q morning - 10a  . Chlorhexidine Gluconate Cloth  6 each Topical Q0600  . clonazePAM  0.5 mg Oral QHS  . docusate sodium  100 mg Oral BID  . latanoprost  1 drop Both Eyes QHS  . levothyroxine  50 mcg Oral Once per day on Mon Tue Wed Thu Fri Sat   And  . [START ON 09/16/2013] levothyroxine  25 mcg Oral Once per day on Sun  . Linaclotide  290 mcg Oral Daily  . magnesium sulfate 1 - 4 g  bolus IVPB  2 g Intravenous Once  . mirtazapine  45 mg Oral QHS  . mupirocin ointment  1 application Nasal BID  . senna-docusate  2 tablet Oral BID  . sertraline  200 mg Oral QPM  . sodium chloride  3 mL Intravenous Q12H  . sulfamethoxazole-trimethoprim  1 tablet Oral BID  . testosterone  5 g Transdermal q morning - 10a  . topiramate  75 mg Oral Daily    Family History Family History  Problem Relation Age of Onset  . Stroke Mother 70  . Hypertension Mother   . Diabetes Mother   . Heart disease Mother   . Rectal cancer Father 36  . Heart disease Father     in 40s  . Cancer Father     colorectal  . Nephritis Sister   . Seizures Brother     died as child     Social History History  Social History  . Marital Status: Married    Spouse Name: [name]    Number of Children: 2  . Years of Education: N/A   Occupational History  . retired    Social History Main Topics  . Smoking status: Former Smoker -- 10 years    Types: Pipe, Cigars    Quit date: 02/16/1979  . Smokeless tobacco: Never Used     Comment: Quit about age 10   . Alcohol Use: 4.2 oz/week    7 Glasses of wine per week     Comment:  occasional  . Drug Use: No  . Sexual Activity: No   Other Topics Concern  . Not on file   Social History Narrative  . No narrative on file     Review of Systems  General:  No chills, fever, night sweats or weight changes.  Cardiovascular:  No chest pain, edema, orthopnea, palpitations, paroxysmal nocturnal dyspnea. +dyspnea with exertion. Dermatological: No rash, lesions/masses Respiratory: No cough, dyspnea Urologic: No hematuria, dysuria Abdominal:   No nausea, vomiting, diarrhea, bright red blood per rectum, melena, or hematemesis Neurologic:  No visual changes. +weakness, however no dizziness All other systems reviewed and are otherwise negative except as noted above.  Physical Exam  Blood pressure 144/74, pulse 65, temperature 97.7 F (36.5 C), temperature  source Oral, resp. rate 18, height 6\' 1"  (1.854 m), weight 147 lb 4.3 oz (66.8 kg), SpO2 99.00%.  General: Pleasant, NAD Psych: Normal affect. Neuro: Alert and oriented X 3. Moves all extremities spontaneously. HEENT: Normal  Neck: Supple without bruits or JVD. Lungs:  Resp regular and unlabored, CTA. Heart: bradycardic no s3, s4, or murmurs. Abdomen: Soft, non-tender, non-distended, BS + x 4.  Extremities: No clubbing, cyanosis or edema. DP/PT/Radials 2+ and equal bilaterally.  Labs   Recent Labs  09/09/13 1923 09/10/13 0139 09/10/13 0800  TROPONINI <0.30 <0.30 <0.30   Lab Results  Component Value Date   WBC 7.3 09/10/2013   HGB 13.2 09/10/2013   HCT 41.1 09/10/2013   MCV 92.2 09/10/2013   PLT 165 09/10/2013    Recent Labs Lab 09/10/13 0139  NA 139  K 4.8  CL 107  CO2 20  BUN 34*  CREATININE 1.17  CALCIUM 8.3*  PROT 5.9*  BILITOT 0.2*  ALKPHOS 48  ALT 27  AST 23  GLUCOSE 85   Lab Results  Component Value Date   CHOL 207* 09/08/2012   HDL 46.90 09/08/2012   LDLCALC 62 09/29/2010   TRIG 98.0 09/08/2012   Lab Results  Component Value Date   DDIMER 0.82* 09/13/2012    Radiology/Studies  Dg Chest 2 View  09/09/2013   CLINICAL DATA:  Weakness.  Fatigue.  EXAM: CHEST  2 VIEW  COMPARISON:  Chest x-ray 09/05/2013.  FINDINGS: Mild elevation of the right hemidiaphragm. No consolidative airspace disease. No pleural effusions. No evidence of pulmonary edema. Heart size is normal. Upper mediastinal contours are within normal limits.  IMPRESSION: 1. Mild elevation of the right hemidiaphragm is new compared to the prior study, but is of uncertain the etiology and significance.   Electronically Signed   By: Vinnie Langton M.D.   On: 09/09/2013 18:44   Dg Pelvis 1-2 Views  09/09/2013   CLINICAL DATA:  Right hip pain secondary to a fall.  EXAM: PELVIS - 1-2 VIEW  COMPARISON:  Radiograph dated 01/26/2013  FINDINGS: There is no fracture or dislocation or diastases. There is  moderately severe arthritis of the right hip with  chondrocalcinosis. Left hip prosthesis appears unchanged. Pelvic bones are intact.  IMPRESSION: No acute abnormality. Moderately severe arthritis of the right hip with chondrocalcinosis.   Electronically Signed   By: Rozetta Nunnery M.D.   On: 09/09/2013 18:45   Ct Head Wo Contrast  09/09/2013   CLINICAL DATA:  Two falls in the last week.  EXAM: CT HEAD WITHOUT CONTRAST  TECHNIQUE: Contiguous axial images were obtained from the base of the skull through the vertex without intravenous contrast.  COMPARISON:  CT scan dated 09/05/2013.  MRI dated 09/05/2013  FINDINGS: No mass lesion. No midline shift. No acute hemorrhage or hematoma. No extra-axial fluid collections. No evidence of acute infarction. There is diffuse cerebral cortical atrophy with secondary ventricular dilatation. There are old tiny lacunar infarcts in the periventricular white matter.  No osseous abnormality.  IMPRESSION: No acute intracranial abnormality. Chronic atrophy and chronic small vessel ischemic disease with tiny lacunar infarcts, stable.   Electronically Signed   By: Rozetta Nunnery M.D.   On: 09/09/2013 20:01   Ct Head Wo Contrast  09/05/2013   CLINICAL DATA:  Several day history of progressive weakness  EXAM: CT HEAD WITHOUT CONTRAST  TECHNIQUE: Contiguous axial images were obtained from the base of the skull through the vertex without intravenous contrast.  COMPARISON:  Prior head CT 07/30/2012; prior brain MRI 08/02/2012  FINDINGS: Negative for acute intracranial hemorrhage, acute infarction, mass, mass effect, hydrocephalus or midline shift. Gray-white differentiation is preserved throughout. A stable appearance of cerebral atrophy and moderately extensive periventricular, subcortical and white matter hypoattenuation consistent with longstanding microvascular ischemic white matter disease. No focal soft tissue or calvarial abnormality. Normal aeration of the mastoid air cells and  visualized paranasal sinuses. Atherosclerotic calcification in the bilateral cavernous carotid arteries.  IMPRESSION: 1. No acute intracranial abnormality. 2. Stable cerebral atrophy and chronic ischemic microvascular white matter changes.   Electronically Signed   By: Jacqulynn Cadet M.D.   On: 09/05/2013 12:33   Mr Brain Wo Contrast  09/05/2013   CLINICAL DATA:  Increasing weakness over the last few days.  EXAM: MRI HEAD WITHOUT CONTRAST  TECHNIQUE: Multiplanar, multiecho pulse sequences of the brain and surrounding structures were obtained without intravenous contrast.  COMPARISON:  Head CT same day  FINDINGS: Diffusion imaging does not show any acute or subacute infarction. There chronic small vessel changes throughout the pons. 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 The cerebral hemispheres show generalized atrophy with old small vessel ischemic changes affecting the thalami and basal ganglia and present throughout the deep and subcortical white matter. No cortical or large vessel territory infarction. No mass lesion, hemorrhage, hydrocephalus or extra-axial collection. No pituitary mass. No inflammatory sinus disease. Major vessels at the base of the brain show flow.  IMPRESSION: No acute infarction. Extensive and widespread chronic small-vessel ischemic changes throughout the brain.   Electronically Signed   By: Nelson Chimes M.D.   On: 09/05/2013 15:04   Dg Chest Portable 1 View  09/05/2013   CLINICAL DATA:  Weakness.  EXAM: PORTABLE CHEST - 1 VIEW  COMPARISON:  01/26/2013  FINDINGS: Cardiac silhouette is normal in size. Normal mediastinal and hilar contours.  Subtle nodule projects over the inferior right lung, most likely a nipple shadow. Recommend followup flexed chest radiograph with nipple markers on a nonemergent basis.  Lungs are mildly hyperexpanded but otherwise clear. No pleural effusion or pneumothorax.  The bony thorax is intact.  IMPRESSION: 1. No acute cardiopulmonary disease.  2. Small nodular opacity at  the right lung base most likely a nipple shadow. See above recommendation.   Electronically Signed   By: Lajean Manes M.D.   On: 09/05/2013 12:30    ECG  No recent EKG  ASSESSMENT AND PLAN  1. Bradycardia - long standing  - no sign of CP or dizziness, with dyspnea on exertion  - TSH normal   - review of telemetry last night showed sinus brady 30-40s during sleep, HR increased to 50-60s while awake. Does look like pt has occasional slow junctional rhythm, however no significant sinus pause noted.  - No clear indication for pacemaker at this time, will discuss with Dr. Debara Pickett  - continue to avoid AV nodal blocking (not on any at this point)  2. Weakness - may be related to poor PO intake and recent UTI  3. HTN 4. R hip hematoma, negative fracture on Xray 5. PAD with neuropathy 6. Carotid stenosis 7. Hypothgyroidism 8. Deconditioning  Signed, Almyra Deforest, PA-C 09/10/2013, 12:51 PM

## 2013-09-10 NOTE — Progress Notes (Signed)
PROGRESS NOTE  Jose Leach:[id number] DOB: [birth year] DOA: 09/09/2013 PCP: Estill Dooms, MD  Assessment/Plan:  Falling/Weakness Secondary to orthostatic hypotension.  Denies dizziness or changes and vision. CT head is negative x 2 Troponins cycled and all within normal limits.  TSH is WNL.  Checking am cortisol. Check CBG q 8 hours. Recent work up including carotid arteries did not reveal cause for falling. BP remains moderately high, but decreases by 30 mmhg when he stands. Klonopin has been decreased. PT recommends continued SNF. Checking orthostatics daily.  Confusion Uncertain etiology. Recent infection may have contributed. Per wife and daughter, patient is much better. Klonopin decreased.  Bradycardia HR dropping into the 30s.  Not on betablocker. Chronic problem that [facility] cardiology has been following. Cardiology consult.  No need for pacemaker.  UTI  Staph in culture (7/22)  No growth in culture (7/24) Pan sensitive Continue bactrim.  Right hematoma. From fall. Will check am Hgb to ensure no further bleeding. Hgb stable at 13.2.    DVT Prophylaxis:  TED hose.   No pharmacologic prophylaxis due to hematoma.  Code Status: full Family Communication: wife and daughter at bedside. Disposition Plan: return to SNF likely 7/28.   Consultants:  cardiology  Procedures:  2 d echo pending.  Antibiotics: Anti-infectives   Start     Dose/Rate Route Frequency Ordered Stop   09/10/13 0130  sulfamethoxazole-trimethoprim (BACTRIM DS) 800-160 MG per tablet 1 tablet     1 tablet Oral 2 times daily 09/10/13 0103        Objective: Filed Vitals:   09/10/13 0639 09/10/13 0640 09/10/13 0641 09/10/13 1355  BP: 171/70 171/71 144/74 148/76  Pulse: 65     Temp: 97.7 F (36.5 C)   97.6 F (36.4 C)  TempSrc: Oral   Oral  Resp: 18   16  Height:      Weight:      SpO2: 99%   100%    Intake/Output Summary (Last 24 hours) at 09/10/13 1625 Last  data filed at 09/10/13 1401  Gross per 24 hour  Intake      0 ml  Output    400 ml  Net   -400 ml   Filed Weights   09/09/13 1747 09/10/13 0104  Weight: 70.308 kg (155 lb) 66.8 kg (147 lb 4.3 oz)    Exam: General: thin elderly male.  A&O x 3.  Well developed.  Pleasant. HEENT:  PERR, EOMI, Anicteic Sclera, MMM.  Neck: Supple, no JVD, no masses  Cardiovascular: bradycardia, S1 S2 auscultated, no rubs, murmurs or gallops.   Respiratory: Clear to auscultation bilaterally with equal chest rise  Abdomen: Soft, nontender, nondistended, + bowel sounds  Extremities: warm dry without cyanosis clubbing or edema.  Neuro: AAOx3, cranial nerves grossly intact. Strength 5/5 in upper and lower extremities  Skin: Without rashes exudates or nodules.      Data Reviewed: Basic Metabolic Panel:  Recent Labs Lab 09/05/13 1206 09/07/13 1209 09/09/13 1807 09/10/13 0139  NA 142 140 141 139  K 4.8 4.2 4.7 4.8  CL 106 105 107 107  CO2 27 25 20 20   GLUCOSE 81 85 79 85  BUN 35* 38* 38* 34*  CREATININE 1.21 1.40* 1.29 1.17  CALCIUM 9.6 9.0 8.5 8.3*  MG  --   --   --  1.7  PHOS  --   --   --  2.7   Liver Function Tests:  Recent Labs Lab 09/05/13 1206 09/07/13 1209  09/09/13 1807 09/10/13 0139  AST 37 29 29 23   ALT 27 26 31 27   ALKPHOS 57 53 53 48  BILITOT 0.3 0.3 <0.2* 0.2*  PROT 6.8 6.4 6.1 5.9*  ALBUMIN 3.5 3.2* 3.0* 2.8*    Recent Labs Lab 09/09/13 1923  AMMONIA 18   CBC:  Recent Labs Lab 09/05/13 1206 09/07/13 1209 09/09/13 1807 09/10/13 0139  WBC 6.3 5.0 5.7 7.3  NEUTROABS 4.2 3.3  --   --   HGB 15.0 14.2 13.8 13.2  HCT 46.3 44.5 42.6 41.1  MCV 93.0 92.5 93.4 92.2  PLT 163 152 165 165   Cardiac Enzymes:  Recent Labs Lab 09/05/13 1206 09/09/13 1923 09/10/13 0139 09/10/13 0800 09/10/13 1256  TROPONINI <0.30 <0.30 <0.30 <0.30 <0.30   BNP (last 3 results)  Recent Labs  09/05/13 1206  PROBNP 472.2*   CBG: No results found for this basename: GLUCAP,   in the last 168 hours  Recent Results (from the past 240 hour(s))  URINE CULTURE     Status: None   Collection Time    09/05/13 12:05 PM      Result Value Ref Range Status   Specimen Description URINE, CLEAN CATCH   Final   Special Requests Normal   Final   Culture  Setup Time     Final   Value: 09/05/2013 15:35     Performed at [place]     Final   Value: >=100,000 COLONIES/ML     Performed at [facility]   Culture     Final   Value: STAPHYLOCOCCUS AUREUS     Note: RIFAMPIN AND GENTAMICIN SHOULD NOT BE USED AS SINGLE DRUGS FOR TREATMENT OF STAPH INFECTIONS.     Performed at [facility]   Report Status 09/07/2013 FINAL   Final   Organism ID, Bacteria STAPHYLOCOCCUS AUREUS   Final  URINE CULTURE     Status: None   Collection Time    09/07/13 12:19 PM      Result Value Ref Range Status   Specimen Description URINE, CLEAN CATCH   Final   Special Requests NONE   Final   Culture  Setup Time     Final   Value: 09/07/2013 21:34     Performed at [facility] Count     Final   Value: NO GROWTH     Performed at [facility]   Culture     Final   Value: NO GROWTH     Performed at [facility]   Report Status 09/08/2013 FINAL   Final  MRSA PCR SCREENING     Status: Abnormal   Collection Time    09/10/13  1:42 AM      Result Value Ref Range Status   MRSA by PCR POSITIVE (*) NEGATIVE Final   Comment:            The GeneXpert MRSA Assay (FDA     approved for NASAL specimens     only), is one component of a     comprehensive MRSA colonization     surveillance program. It is not     intended to diagnose MRSA     infection nor to guide or     monitor treatment for     MRSA infections.     RESULT CALLED TO, READ BACK BY AND VERIFIED WITH:     CALLED TO RN Wilburn Mylar CONLEY [id number] @[phone]   THANEY     Studies: Dg Chest 2 View  09/09/2013   CLINICAL DATA:  Weakness.  Fatigue.  EXAM: CHEST  2 VIEW  COMPARISON:   Chest x-ray 09/05/2013.  FINDINGS: Mild elevation of the right hemidiaphragm. No consolidative airspace disease. No pleural effusions. No evidence of pulmonary edema. Heart size is normal. Upper mediastinal contours are within normal limits.  IMPRESSION: 1. Mild elevation of the right hemidiaphragm is new compared to the prior study, but is of uncertain the etiology and significance.   Electronically Signed   By: Vinnie Langton M.D.   On: 09/09/2013 18:44   Dg Pelvis 1-2 Views  09/09/2013   CLINICAL DATA:  Right hip pain secondary to a fall.  EXAM: PELVIS - 1-2 VIEW  COMPARISON:  Radiograph dated 01/26/2013  FINDINGS: There is no fracture or dislocation or diastases. There is moderately severe arthritis of the right hip with chondrocalcinosis. Left hip prosthesis appears unchanged. Pelvic bones are intact.  IMPRESSION: No acute abnormality. Moderately severe arthritis of the right hip with chondrocalcinosis.   Electronically Signed   By: Rozetta Nunnery M.D.   On: 09/09/2013 18:45   Ct Head Wo Contrast  09/09/2013   CLINICAL DATA:  Two falls in the last week.  EXAM: CT HEAD WITHOUT CONTRAST  TECHNIQUE: Contiguous axial images were obtained from the base of the skull through the vertex without intravenous contrast.  COMPARISON:  CT scan dated 09/05/2013.  MRI dated 09/05/2013  FINDINGS: No mass lesion. No midline shift. No acute hemorrhage or hematoma. No extra-axial fluid collections. No evidence of acute infarction. There is diffuse cerebral cortical atrophy with secondary ventricular dilatation. There are old tiny lacunar infarcts in the periventricular white matter.  No osseous abnormality.  IMPRESSION: No acute intracranial abnormality. Chronic atrophy and chronic small vessel ischemic disease with tiny lacunar infarcts, stable.   Electronically Signed   By: Rozetta Nunnery M.D.   On: 09/09/2013 20:01    Scheduled Meds: . aspirin EC  81 mg Oral q morning - 10a  . atorvastatin  20 mg Oral QPM  .  brimonidine  1 drop Right Eye BID  . buPROPion  300 mg Oral q morning - 10a  . Chlorhexidine Gluconate Cloth  6 each Topical Q0600  . clonazePAM  0.5 mg Oral QHS  . docusate sodium  100 mg Oral BID  . lactose free nutrition  237 mL Oral TID WC  . latanoprost  1 drop Both Eyes QHS  . levothyroxine  50 mcg Oral Once per day on Mon Tue Wed Thu Fri Sat   And  . [START ON 09/16/2013] levothyroxine  25 mcg Oral Once per day on Sun  . Linaclotide  290 mcg Oral Daily  . mirtazapine  45 mg Oral QHS  . mupirocin ointment  1 application Nasal BID  . senna-docusate  2 tablet Oral BID  . sertraline  200 mg Oral QPM  . sodium chloride  3 mL Intravenous Q12H  . sulfamethoxazole-trimethoprim  1 tablet Oral BID  . testosterone  5 g Transdermal q morning - 10a  . topiramate  75 mg Oral Daily   Continuous Infusions: . sodium chloride      Principal Problem:   Weakness Active Problems:   HTN (hypertension)   Volume depletion   UTI (lower urinary tract infection)   Hip hematoma, right   Altered mental status    Karen Kitchens  [facility] Pager [phone]. If 7PM-7AM, please contact night-coverage at [email or web address],  password [email or web address] 09/10/2013, 4:25 PM  LOS: 1 day

## 2013-09-10 NOTE — Consult Note (Signed)
Pt. Seen and examined. Agree with the NP/PA-C note as written.  Pleasant 80 yo male patient of Dr. Burt Knack, with long-standing bradycardia. He has been evaluated for this numerous times, including last year by Dr. Caryl Comes. It is noted his HR is lower at night and increases during the day and with exertion.  No evidence for heart block was noted on telemetry. This is not felt to be the cause of his weakness and falls. Pacemaker placement is not indicated and will likely not improve his situation.  Thanks for consulting [place].  Call with questions.  Pixie Casino, MD, [facility] Attending Cardiologist [place]

## 2013-09-10 NOTE — Progress Notes (Signed)
Pt seen and examined. Agree with assessment and plan as outlined by Ms. York, PA-C. Briefly, pt presents with weakness and falls, found to be both orthostatic and bradycardic. Cardiology has seen this patient with recommendations for continued close monitoring. Otherwise, pt has improved markedly with IVF hydration. Would continue hydration overnight and check AM cortisol level. If level normal and pt clinically near baseline, would consider d/c, hopefully soon.

## 2013-09-11 DIAGNOSIS — E869 Volume depletion, unspecified: Secondary | ICD-10-CM

## 2013-09-11 DIAGNOSIS — E039 Hypothyroidism, unspecified: Secondary | ICD-10-CM

## 2013-09-11 DIAGNOSIS — I369 Nonrheumatic tricuspid valve disorder, unspecified: Secondary | ICD-10-CM

## 2013-09-11 LAB — GLUCOSE, CAPILLARY
GLUCOSE-CAPILLARY: 116 mg/dL — AB (ref 70–99)
Glucose-Capillary: 86 mg/dL (ref 70–99)

## 2013-09-11 LAB — CORTISOL-AM, BLOOD: Cortisol - AM: 7.1 ug/dL (ref 4.3–22.4)

## 2013-09-11 MED ORDER — ACETAMINOPHEN 325 MG PO TABS: 650.0000 mg | ORAL_TABLET | Freq: Four times a day (QID) | ORAL | Status: DC | PRN

## 2013-09-11 MED ORDER — BOOST PLUS PO LIQD: 237.0000 mL | Freq: Three times a day (TID) | ORAL | Status: DC

## 2013-09-11 MED ORDER — CLONAZEPAM 0.5 MG PO TABS: 0.5000 mg | ORAL_TABLET | Freq: Every day | ORAL | Status: DC

## 2013-09-11 MED ORDER — DSS 100 MG PO CAPS: 100.0000 mg | ORAL_CAPSULE | Freq: Two times a day (BID) | ORAL | Status: DC

## 2013-09-11 MED ORDER — HYDROCODONE-ACETAMINOPHEN 5-325 MG PO TABS: 1.0000 | ORAL_TABLET | ORAL | Status: DC | PRN

## 2013-09-11 MED ORDER — CLONAZEPAM 0.5 MG PO TABS: 0.2500 mg | ORAL_TABLET | Freq: Two times a day (BID) | ORAL | Status: DC | PRN

## 2013-09-11 NOTE — Discharge Summary (Signed)
Physician Discharge Summary  Jose Leach FWY:[id number] DOB: 18-Nov-1932 DOA: 09/09/2013  PCP: Estill Dooms, MD  Admit date: 09/09/2013 Discharge date: 09/11/2013  Time spent: 60 minutes  Recommendations for Outpatient Follow-up:  1. Monitor BP - patient has been orthostatic 2. Encourage PO intake.  Ensure TID. 3. PT / OT  Discharge Diagnoses:  Principal Problem:   Weakness Active Problems:   HTN (hypertension)   Volume depletion   UTI (lower urinary tract infection)   Hip hematoma, right   Altered mental status   Discharge Condition: stable.  High fall risk.    Diet recommendation: heart healthy   Filed Weights   09/09/13 1747 09/10/13 0104  Weight: 70.308 kg (155 lb) 66.8 kg (147 lb 4.3 oz)    History of present illness:  Patient has hx of peripheral neuropathy and trouble with balance requiring a walker for the past 2 years. 1 week ago he fell while in the bathroom x2 he continued to be progressively week. He has not been drinking well at the facility due to intermittent confusion. 4 days prior to admission the patient was diagnosed with a UTI at [facility].  He was started on bactrim.  He was admitted on 7/26 to [facility] with confusion, weakness, and ortho stasis.   Hospital Course:  Falling/Weakness  Secondary to orthostatic hypotension. Denies dizziness or changes and vision.  Improved with IVF. CT head is negative x 2  Troponins cycled and all within normal limits. TSH is WNL. Am Cortisol was normal.  CBGs are stable without hypoglycemia.  Recent work up including carotid arteries did not reveal cause for falling.  BP remains moderately high, but decreases by 30 mmhg when he stands. (SBP 170 -> 140) Klonopin has been decreased.  PT recommends continued SNF.  Patient remains a high fall risk.  Confusion  Uncertain etiology.  Recent Urinary tract infection may have contributed.  Per wife and daughter, patient is much better.  Klonopin decreased from 0.5 mg  TID to 0.25 BID PRN plus 0.25 qhs.  Bradycardia  HR dropping into the 30s. Not on betablocker.  Chronic problem that [facility] cardiology has been following.  Cardiology consult this admission.   No need for pacemaker.   UTI  Staph in culture (7/22) No growth in culture (7/24)  Pan sensitive  Completed 6 days of Bactrim.  No further antibiotic therapy.  Right hematoma.  From fall.  Will check am Hgb to ensure no further bleeding.  Hgb stable at 13.   Procedures:  none  Consultations:  none  Discharge Exam: Filed Vitals:   09/11/13 0631  Pulse:   Temp: 97.7 F (36.5 C)  Resp: 18   General: thin elderly male. A&O x 3. Well developed. Pleasant. Eating breakfast. HEENT: PERR, EOMI, Anicteic Sclera, MMM.  Neck: Supple, no JVD, no masses  Cardiovascular: bradycardia, S1 S2 auscultated, no rubs, murmurs or gallops.  Respiratory: Clear to auscultation bilaterally with equal chest rise  Abdomen: Soft, nontender, nondistended, + bowel sounds, no organomegaly. Extremities: warm dry without cyanosis clubbing or edema.  Neuro: AAOx3, cranial nerves grossly intact. Strength 5/5 in upper and lower extremities  Skin: Without rashes exudates or nodules    Discharge Instructions       Discharge Instructions   Diet - low sodium heart healthy    Complete by:  As directed      Increase activity slowly    Complete by:  As directed  Medication List    STOP taking these medications       sulfamethoxazole-trimethoprim 800-160 MG per tablet  Commonly known as:  BACTRIM DS      TAKE these medications       acetaminophen 325 MG tablet  Commonly known as:  TYLENOL  Take 2 tablets (650 mg total) by mouth every 6 (six) hours as needed for mild pain (or Fever >/= 101).     aspirin 81 MG tablet  Take 81 mg by mouth every morning.     atorvastatin 20 MG tablet  Commonly known as:  LIPITOR  Take 20 mg by mouth every evening.     B-complex with vitamin C tablet   Take 1 tablet by mouth every morning.     bimatoprost 0.01 % Soln  Commonly known as:  LUMIGAN  Place 1 drop into both eyes at bedtime.     brimonidine 0.15 % ophthalmic solution  Commonly known as:  ALPHAGAN  Place 1 drop into the right eye 2 (two) times daily.     cholecalciferol 1000 UNITS tablet  Commonly known as:  VITAMIN D  Take 2,000 Units by mouth 2 (two) times daily.     clonazePAM 0.5 MG tablet  Commonly known as:  KLONOPIN  Take 0.5 tablets (0.25 mg total) by mouth 2 (two) times daily as needed (anxiety, agitation).     clonazePAM 0.5 MG tablet  Commonly known as:  KLONOPIN  Take 1 tablet (0.5 mg total) by mouth at bedtime.     DSS 100 MG Caps  Take 100 mg by mouth 2 (two) times daily.     FLAX SEEDS PO  Take 1 tablet by mouth every morning.     HYDROcodone-acetaminophen 5-325 MG per tablet  Commonly known as:  NORCO/VICODIN  Take 1 tablet by mouth every 4 (four) hours as needed for moderate pain.     ICAPS AREDS FORMULA PO  Take 1 capsule by mouth daily.     Krill Oil 500 MG Caps  Take 500 mg by mouth every morning.     L-Methylfolate 7.5 MG Tabs  Take 7.5 mg by mouth daily.     lactose free nutrition Liqd  Take 237 mLs by mouth 3 (three) times daily with meals.     levothyroxine 50 MCG tablet  Commonly known as:  SYNTHROID, LEVOTHROID  Take 0.5-1 tablets (25-50 mcg total) by mouth every morning. Take 1/2 tab (19mcg) on Sundays, 1 tab (68mcg) on all other days.     LINZESS 290 MCG Caps capsule  Generic drug:  Linaclotide  Take 290 mcg by mouth daily.     mirtazapine 45 MG tablet  Commonly known as:  REMERON  Take 45 mg by mouth at bedtime.     polyethylene glycol packet  Commonly known as:  MIRALAX / GLYCOLAX  Take 17 g by mouth daily as needed for mild constipation.     sennosides-docusate sodium 8.6-50 MG tablet  Commonly known as:  SENOKOT-S  Take 2 tablets by mouth 2 (two) times daily.     sertraline 100 MG tablet  Commonly known as:   ZOLOFT  Take 200 mg by mouth every evening.     [name] 50 MG/5GM (1%) Gel  Generic drug:  testosterone  Place 5 g onto the skin every morning.     topiramate 25 MG tablet  Commonly known as:  TOPAMAX  Take 75 mg by mouth daily.     vitamin B-12 1000 MCG tablet  Commonly known as:  CYANOCOBALAMIN  Take 1,000 mcg by mouth every morning.     WELLBUTRIN XL 300 MG 24 hr tablet  Generic drug:  buPROPion  Take 300 mg by mouth every morning.       Allergies  Allergen Reactions  . Latex Rash  . Tape Rash    Paper tape is ok to use       The results of significant diagnostics from this hospitalization (including imaging, microbiology, ancillary and laboratory) are listed below for reference.    Significant Diagnostic Studies: Dg Chest 2 View  09/09/2013   CLINICAL DATA:  Weakness.  Fatigue.  EXAM: CHEST  2 VIEW  COMPARISON:  Chest x-ray 09/05/2013.  FINDINGS: Mild elevation of the right hemidiaphragm. No consolidative airspace disease. No pleural effusions. No evidence of pulmonary edema. Heart size is normal. Upper mediastinal contours are within normal limits.  IMPRESSION: 1. Mild elevation of the right hemidiaphragm is new compared to the prior study, but is of uncertain the etiology and significance.   Electronically Signed   By: Vinnie Langton M.D.   On: 09/09/2013 18:44   Dg Pelvis 1-2 Views  09/09/2013   CLINICAL DATA:  Right hip pain secondary to a fall.  EXAM: PELVIS - 1-2 VIEW  COMPARISON:  Radiograph dated 01/26/2013  FINDINGS: There is no fracture or dislocation or diastases. There is moderately severe arthritis of the right hip with chondrocalcinosis. Left hip prosthesis appears unchanged. Pelvic bones are intact.  IMPRESSION: No acute abnormality. Moderately severe arthritis of the right hip with chondrocalcinosis.   Electronically Signed   By: Rozetta Nunnery M.D.   On: 09/09/2013 18:45   Ct Head Wo Contrast  09/09/2013   CLINICAL DATA:  Two falls in the last week.   EXAM: CT HEAD WITHOUT CONTRAST  TECHNIQUE: Contiguous axial images were obtained from the base of the skull through the vertex without intravenous contrast.  COMPARISON:  CT scan dated 09/05/2013.  MRI dated 09/05/2013  FINDINGS: No mass lesion. No midline shift. No acute hemorrhage or hematoma. No extra-axial fluid collections. No evidence of acute infarction. There is diffuse cerebral cortical atrophy with secondary ventricular dilatation. There are old tiny lacunar infarcts in the periventricular white matter.  No osseous abnormality.  IMPRESSION: No acute intracranial abnormality. Chronic atrophy and chronic small vessel ischemic disease with tiny lacunar infarcts, stable.   Electronically Signed   By: Rozetta Nunnery M.D.   On: 09/09/2013 20:01   Ct Head Wo Contrast  09/05/2013   CLINICAL DATA:  Several day history of progressive weakness  EXAM: CT HEAD WITHOUT CONTRAST  TECHNIQUE: Contiguous axial images were obtained from the base of the skull through the vertex without intravenous contrast.  COMPARISON:  Prior head CT 07/30/2012; prior brain MRI 08/02/2012  FINDINGS: Negative for acute intracranial hemorrhage, acute infarction, mass, mass effect, hydrocephalus or midline shift. Gray-white differentiation is preserved throughout. A stable appearance of cerebral atrophy and moderately extensive periventricular, subcortical and white matter hypoattenuation consistent with longstanding microvascular ischemic white matter disease. No focal soft tissue or calvarial abnormality. Normal aeration of the mastoid air cells and visualized paranasal sinuses. Atherosclerotic calcification in the bilateral cavernous carotid arteries.  IMPRESSION: 1. No acute intracranial abnormality. 2. Stable cerebral atrophy and chronic ischemic microvascular white matter changes.   Electronically Signed   By: Jacqulynn Cadet M.D.   On: 09/05/2013 12:33   Mr Brain Wo Contrast  09/05/2013   CLINICAL DATA:  Increasing weakness over  the last  few days.  EXAM: MRI HEAD WITHOUT CONTRAST  TECHNIQUE: Multiplanar, multiecho pulse sequences of the brain and surrounding structures were obtained without intravenous contrast.  COMPARISON:  Head CT same day  FINDINGS: Diffusion imaging does not show any acute or subacute infarction. There chronic small vessel changes throughout the pons. 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 The cerebral hemispheres show generalized atrophy with old small vessel ischemic changes affecting the thalami and basal ganglia and present throughout the deep and subcortical white matter. No cortical or large vessel territory infarction. No mass lesion, hemorrhage, hydrocephalus or extra-axial collection. No pituitary mass. No inflammatory sinus disease. Major vessels at the base of the brain show flow.  IMPRESSION: No acute infarction. Extensive and widespread chronic small-vessel ischemic changes throughout the brain.   Electronically Signed   By: Nelson Chimes M.D.   On: 09/05/2013 15:04   Dg Chest Portable 1 View  09/05/2013   CLINICAL DATA:  Weakness.  EXAM: PORTABLE CHEST - 1 VIEW  COMPARISON:  01/26/2013  FINDINGS: Cardiac silhouette is normal in size. Normal mediastinal and hilar contours.  Subtle nodule projects over the inferior right lung, most likely a nipple shadow. Recommend followup flexed chest radiograph with nipple markers on a nonemergent basis.  Lungs are mildly hyperexpanded but otherwise clear. No pleural effusion or pneumothorax.  The bony thorax is intact.  IMPRESSION: 1. No acute cardiopulmonary disease. 2. Small nodular opacity at the right lung base most likely a nipple shadow. See above recommendation.   Electronically Signed   By: Lajean Manes M.D.   On: 09/05/2013 12:30    Microbiology: Recent Results (from the past 240 hour(s))  URINE CULTURE     Status: None   Collection Time    09/05/13 12:05 PM      Result Value Ref Range Status   Specimen Description URINE, CLEAN CATCH    Final   Special Requests Normal   Final   Culture  Setup Time     Final   Value: 09/05/2013 15:35     Performed at [facility]     Final   Value: >=100,000 COLONIES/ML     Performed at [facility]   Culture     Final   Value: STAPHYLOCOCCUS AUREUS     Note: RIFAMPIN AND GENTAMICIN SHOULD NOT BE USED AS SINGLE DRUGS FOR TREATMENT OF STAPH INFECTIONS.     Performed at [facility]   Report Status 09/07/2013 FINAL   Final   Organism ID, Bacteria STAPHYLOCOCCUS AUREUS   Final  URINE CULTURE     Status: None   Collection Time    09/07/13 12:19 PM      Result Value Ref Range Status   Specimen Description URINE, CLEAN CATCH   Final   Special Requests NONE   Final   Culture  Setup Time     Final   Value: 09/07/2013 21:34     Performed at [facility] Count     Final   Value: NO GROWTH     Performed at [facility]   Culture     Final   Value: NO GROWTH     Performed at [facility]   Report Status 09/08/2013 FINAL   Final  MRSA PCR SCREENING     Status: Abnormal   Collection Time    09/10/13  1:42 AM      Result Value Ref Range Status   MRSA  by PCR POSITIVE (*) NEGATIVE Final   Comment:            The GeneXpert MRSA Assay (FDA     approved for NASAL specimens     only), is one component of a     comprehensive MRSA colonization     surveillance program. It is not     intended to diagnose MRSA     infection nor to guide or     monitor treatment for     MRSA infections.     RESULT CALLED TO, READ BACK BY AND VERIFIED WITH:     CALLED TO RN Wilburn Mylar Leach [phone] @0534  THANEY     Labs: Basic Metabolic Panel:  Recent Labs Lab 09/05/13 1206 09/07/13 1209 09/09/13 1807 09/10/13 0139  NA 142 140 141 139  K 4.8 4.2 4.7 4.8  CL 106 105 107 107  CO2 27 25 20 20   GLUCOSE 81 85 79 85  BUN 35* 38* 38* 34*  CREATININE 1.21 1.40* 1.29 1.17  CALCIUM 9.6 9.0 8.5 8.3*  MG  --   --   --  1.7  PHOS  --   --    --  2.7   Liver Function Tests:  Recent Labs Lab 09/05/13 1206 09/07/13 1209 09/09/13 1807 09/10/13 0139  AST 37 29 29 23   ALT 27 26 31 27   ALKPHOS 57 53 53 48  BILITOT 0.3 0.3 <0.2* 0.2*  PROT 6.8 6.4 6.1 5.9*  ALBUMIN 3.5 3.2* 3.0* 2.8*    Recent Labs Lab 09/09/13 1923  AMMONIA 18   CBC:  Recent Labs Lab 09/05/13 1206 09/07/13 1209 09/09/13 1807 09/10/13 0139  WBC 6.3 5.0 5.7 7.3  NEUTROABS 4.2 3.3  --   --   HGB 15.0 14.2 13.8 13.2  HCT 46.3 44.5 42.6 41.1  MCV 93.0 92.5 93.4 92.2  PLT 163 152 165 165   Cardiac Enzymes:  Recent Labs Lab 09/05/13 1206 09/09/13 1923 09/10/13 0139 09/10/13 0800 09/10/13 1256  TROPONINI <0.30 <0.30 <0.30 <0.30 <0.30   BNP: BNP (last 3 results)  Recent Labs  09/05/13 1206  PROBNP 472.2*   CBG:  Recent Labs Lab 09/11/13 0029 09/11/13 0756  GLUCAP 116* [address]       Signed:  Karen Kitchens [phone]  [facility] 09/11/2013, 12:02 PM

## 2013-09-11 NOTE — Care Management Note (Signed)
    Page 1 of 1   09/11/2013     4:05:38 PM CARE MANAGEMENT NOTE 09/11/2013  Patient:  Jose Leach, Jose Leach   Account Number:  [id number]  Date Initiated:  09/11/2013  Documentation initiated by:  Tomi Bamberger  Subjective/Objective Assessment:   dx fall, uti, orthostasis, bradycardia  [facility]     Action/Plan:   Anticipated DC Date:  09/04/2013   Anticipated DC Plan:  SKILLED NURSING FACILITY  In-house referral  Clinical Social Worker      DC Planning Services  CM consult      Choice offered to / List presented to:             Status of service:  Completed, signed off Medicare Important Message given?  NA - LOS <3 / Initial given by admissions (If response is "NO", the following Medicare IM given date fields will be blank) Date Medicare IM given:   Medicare IM given by:   Date Additional Medicare IM given:   Additional Medicare IM given by:    Discharge Disposition:  [place]  Per UR Regulation:  Reviewed for med. necessity/level of care/duration of stay  If discussed at [place] of Stay Meetings, dates discussed:    Comments:

## 2013-09-11 NOTE — Progress Notes (Signed)
  Echocardiogram 2D Echocardiogram has been performed.  Jose Leach M 09/11/2013, 3:31 PM

## 2013-09-11 NOTE — Progress Notes (Signed)
Pt prepared for d/c to SNF. IV d/c'd. Skin intact except as most recently charted. Vitals are stable. Report called to receiving facility. Pt to be transported by family to [facility].

## 2013-09-11 NOTE — Clinical Social Work Note (Signed)
Per MD patient ready for DC back to [facility] SNF. RN, patient, patient's wife (at bedside), and facility notified of DC. RN given number for report. DC packet placed on chart. Family to transport by personal vehicle. RN instructed to give patient DC packet. CSW signing off.   Liz Beach MSW, [place], [place], [phone]

## 2013-09-11 NOTE — Clinical Social Work Psychosocial (Signed)
Clinical Social Work Department BRIEF PSYCHOSOCIAL ASSESSMENT 09/11/2013  Patient:  Jose Leach, Jose Leach     Account Number:  [id number]     Admit date:  09/09/2013  Clinical Social Worker:  Lovey Newcomer  Date/Time:  09/10/2013 10:49 AM  Referred by:  Physician  Date Referred:  09/11/2013 Referred for  SNF Placement   Other Referral:   Interview type:  Patient Other interview type:   Patient and family interviewed at bedside.    PSYCHOSOCIAL DATA Living Status:  FACILITY Admitted from facility:  [facility] Level of care:  [place] Primary support name:  Leemon Ayala Primary support relationship to patient:  SPOUSE Degree of support available:   Support is good.    CURRENT CONCERNS Current Concerns  Post-Acute Placement   Other Concerns:    SOCIAL WORK ASSESSMENT / PLAN CSW met with patient and wife at bedside to complete assessment. Patient was admitted from [facility] SNF. Patient and his wife have lived at the independent living level of [facility] for years. The patient just recently went into the SNF level of [facility] (admitted on Sunday). Per patient and family the plan is for the patient to return to the SNF at discharge then transition back to IDL if able. Patient and wife are happy with the care they receive at the facility and both were engaged in assessment.   Assessment/plan status:  Psychosocial Support/Ongoing Assessment of Needs Other assessment/ plan:   Complete FL2, Fax, PASRR   Information/referral to community resources:   CSW contact information given.    PATIENT'S/FAMILY'S RESPONSE TO PLAN OF CARE: Patient plans to return to [facility] SNF at discharge. CSW will assist.       Liz Beach MSW, [place], [place], [phone]

## 2013-09-11 NOTE — Discharge Summary (Signed)
Pt seen and examined. Agree with above note. Briefly, pt presents with orthostatic hypotension and acute delirium with bradycardia. Symptoms have improved markedly with hydration. Pt is medically stable for close outpatient follow up.

## 2013-09-13 ENCOUNTER — Non-Acute Institutional Stay (SKILLED_NURSING_FACILITY): Payer: Medicare Other | Admitting: Internal Medicine

## 2013-09-13 ENCOUNTER — Encounter: Payer: Self-pay | Admitting: Internal Medicine

## 2013-09-13 DIAGNOSIS — G250 Essential tremor: Secondary | ICD-10-CM

## 2013-09-13 DIAGNOSIS — R2689 Other abnormalities of gait and mobility: Secondary | ICD-10-CM

## 2013-09-13 DIAGNOSIS — R531 Weakness: Secondary | ICD-10-CM

## 2013-09-13 DIAGNOSIS — R5383 Other fatigue: Secondary | ICD-10-CM

## 2013-09-13 DIAGNOSIS — S7001XA Contusion of right hip, initial encounter: Secondary | ICD-10-CM

## 2013-09-13 DIAGNOSIS — R5381 Other malaise: Secondary | ICD-10-CM

## 2013-09-13 DIAGNOSIS — S7000XA Contusion of unspecified hip, initial encounter: Secondary | ICD-10-CM

## 2013-09-13 DIAGNOSIS — I10 Essential (primary) hypertension: Secondary | ICD-10-CM

## 2013-09-13 DIAGNOSIS — G252 Other specified forms of tremor: Secondary | ICD-10-CM

## 2013-09-13 DIAGNOSIS — R29818 Other symptoms and signs involving the nervous system: Secondary | ICD-10-CM

## 2013-09-13 NOTE — Progress Notes (Addendum)
Patient ID: Jose Leach, male   DOB: 06/22/1932, 80 y.o.   MRN: [id number]    Location:  [facility]   Place of Service: SNF (31)  Extended Emergency Contact Information Primary Emergency Contact: Spaziani,[name] Address: [address]          [place], [place] [address] [place] Home Phone: [phone] Mobile Phone: [phone] Relation: Spouse   Chief Complaint  Patient presents with  . Hospitalization Follow-up    HPI:  Patient was seen in the emergency room 09/05/13. He had fallen twice in a couple of days at his independent living apartment. Patient lives [facility]. In the emergency room the possibility of urinary tract infection was noted. He was started on Septra for this and returned to his independent living apartment. Patient had more falls and was admitted to the skilled nursing facility level of [facility] on 09/07/13.   While in the SNF level, he had another fall and was returned to the hospital on 09/09/13. A large right hip hematoma without fracture was diagnosed. He had generalized weakness. He was stabilized and returned to the skilled nursing facility at [facility] on 09/11/13.  Since returned to [facility], he has improved in strength and a previous encephalopathic state seems to be resolved. He is still uncomfortable with a right hip hematoma. The hematoma is shrinking.  Patient has multiple other problems including a previous history of falls, hypertension, depression, anemia, carotid artery disease, testosterone deficiency, and small vessel disease of the brain. He has a persistent tremor which has been attributed to a benign essential tremor. He has difficulty walking. He is known to be hypothyroid and have hyperlipidemia.  Past Medical History  Diagnosis Date  . Migraines     Dr Willis  . Peripheral neuropathy   . Benign essential tremor   . Cervical stenosis of spine   . Anemia     B12 deficiency  .  Testosterone deficiency     Dr Ron Davis, [facility]  . Hypothyroidism   . Hyperlipidemia   . Depression     Dr Steiner  . History of shingles 2009  . H/O cardiovascular stress test     a. 02/2003 -> no ischemia/infarct.  . Hx of echocardiogram     Echocardiogram 08/01/12: Mild LVH, EF 55-60%, normal wall motion.  . PAD (peripheral artery disease)   . Carotid stenosis     s/p L CEA  . Fecal impaction 10/11/2012  . Macular edema   . Cervical spinal stenosis   . Hematoma of leg   . Acute urinary retention 01/28/2013  . AKI (acute kidney injury) 01/26/2013  . Altered mental status 09/09/2013  . ANXIETY 06/07/2007    Qualifier: Diagnosis of  By: McMurray CMA (AAMA), June    . ARTHRITIS 06/07/2007    Qualifier: Diagnosis of  By: McMurray CMA (AAMA), June    . Balance problems 04/05/2011  . Benign fibroma of prostate 10/11/2011  . Bilateral inguinal hernia 10/09/2012  . Bilateral leg edema 09/13/2012  . Bladder retention 02/05/2013  . Bradycardia, sinus 08/02/2012  . 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 CAROTID ARTERY DISEASE 03/31/2007    Qualifier: Diagnosis of  By: Hopper MD, William    . Degeneration macular 11/02/2011  . Difficulty in walking(719.7) 04/05/2011  . ESOPHAGITIS 08/06/2002    Qualifier: Diagnosis of  By: McMurray CMA (AAMA), June    . Essential and other specified forms of tremor 04/18/2013  .   Family history of colon cancer 07/24/2012  . Femoral hernia, bilateral s/p lap repair 01/16/2013 01/16/2013  . HAMMER TOE 04/23/2010    Qualifier: Diagnosis of  By: Hopper MD, William    . Hip hematoma, right 09/09/2013  . HTN (hypertension) 08/22/2012  . Hydrocele 10/11/2011  . 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 MORTON'S NEUROMA, LEFT 08/22/2007    Qualifier: Diagnosis of  By: Hopper MD, William    . Personal history of colonic polyps 01/05/2013    2014 2 mm adenomatous polyp   . PREMATURE ATRIAL CONTRACTIONS 03/14/2006    Qualifier: Diagnosis of  By: Hopper MD, William    . Pseudoaphakia  12/16/2011  . SPINAL STENOSIS 03/14/2006    Qualifier: Diagnosis of  By: Hopper MD, William   Cervical spine    . Unspecified vitamin D deficiency 08/06/2012  . Urge incontinence 10/09/2012  . Urinary tract infection, site not specified 02/13/2013  . Weakness 08/02/2012    Past Surgical History  Procedure Laterality Date  . Appendectomy  1941  . Lumbar laminectomy    . Carotid endarterectomy       L  . Tonsillectomy and adenoidectomy    . Total hip arthroplasty      L  . Cataract extraction Right   . Eye surgery Right 02/2012    retina  . Inguinal hernia repair Bilateral 01/16/2013    Procedure: LAPAROSCOPIC BILATERAL INGUINAL DIRECT AND INDIRECT, FEMORAL, AND OBTURATOR HERNIAS;  Surgeon: Steven C. Gross, MD;  Location: [facility] OR;  Service: General;  Laterality: Bilateral;  . Insertion of mesh N/A 01/16/2013    Procedure: INSERTION OF MESH;  Surgeon: Steven C. Gross, MD;  Location: [facility] OR;  Service: General;  Laterality: N/A;  . Breast cyst excision Left 01/16/2013    Procedure: MASS EXCISION AXILLA;  Surgeon: Steven C. Gross, MD;  Location: [facility] OR;  Service: General;  Laterality: Left;    History   Social History  . Marital Status: Married    Spouse Name: [name]    Number of Children: 2  . Years of Education: N/A   Occupational History  . retired    Social History Main Topics  . Smoking status: Former Smoker -- 10 years    Types: Pipe, Cigars    Quit date: 02/16/1979  . Smokeless tobacco: Never Used     Comment: Quit about age 42   . Alcohol Use: 4.2 oz/week    7 Glasses of wine per week     Comment:  occasional  . Drug Use: No  . Sexual Activity: No   Other Topics Concern  . Not on file   Social History Narrative  . No narrative on file     reports that he quit smoking about 34 years ago. His smoking use included Pipe and Cigars. He has never used smokeless tobacco. He reports that he drinks about 4.2 ounces of alcohol per week. He reports that he does not use illicit  drugs.  Immunization History  Administered Date(s) Administered  . Influenza Split 11/12/2011  . Influenza Whole 10/30/2007, 11/12/2008, 11/26/2009  . PPD Test 06/23/2011  . Zoster 08/30/2006    Allergies  Allergen Reactions  . Latex Rash  . Tape Rash    Paper tape is ok to use     Medications: Patient's Medications  New Prescriptions   No medications on file  Previous Medications   ACETAMINOPHEN (TYLENOL) 325 MG TABLET    Take 2 tablets (650 mg total) by mouth every 6 (six)   hours as needed for mild pain (or Fever >/= 101).   ASPIRIN 81 MG TABLET    Take 81 mg by mouth every morning.    ATORVASTATIN (LIPITOR) 20 MG TABLET    Take 20 mg by mouth every evening.    B COMPLEX-C (B-COMPLEX WITH VITAMIN C) TABLET    Take 1 tablet by mouth every morning.   BIMATOPROST (LUMIGAN) 0.01 % SOLN    Place 1 drop into both eyes at bedtime.   BRIMONIDINE (ALPHAGAN) 0.15 % OPHTHALMIC SOLUTION    Place 1 drop into the right eye 2 (two) times daily.   BUPROPION (WELLBUTRIN XL) 300 MG 24 HR TABLET    Take 300 mg by mouth every morning.    CHOLECALCIFEROL (VITAMIN D) 1000 UNITS TABLET    Take 2,000 Units by mouth 2 (two) times daily.    CLONAZEPAM (KLONOPIN) 0.5 MG TABLET    Take 0.5 tablets (0.25 mg total) by mouth 2 (two) times daily as needed (anxiety, agitation).   CLONAZEPAM (KLONOPIN) 0.5 MG TABLET    Take 1 tablet (0.5 mg total) by mouth at bedtime.   DOCUSATE SODIUM 100 MG CAPS    Take 100 mg by mouth 2 (two) times daily.   FLAXSEED, LINSEED, (FLAX SEEDS PO)    Take 1 tablet by mouth every morning.    HYDROCODONE-ACETAMINOPHEN (NORCO/VICODIN) 5-325 MG PER TABLET    Take 1 tablet by mouth every 4 (four) hours as needed for moderate pain.   KRILL OIL 500 MG CAPS    Take 500 mg by mouth every morning.    L-METHYLFOLATE 7.5 MG TABS    Take 7.5 mg by mouth daily.   LACTOSE FREE NUTRITION (BOOST PLUS) LIQD    Take 237 mLs by mouth 3 (three) times daily with meals.   LEVOTHYROXINE (SYNTHROID,  LEVOTHROID) 50 MCG TABLET    Take 0.5-1 tablets (25-50 mcg total) by mouth every morning. Take 1/2 tab (64mg) on Sundays, 1 tab (580m) on all other days.   LINACLOTIDE (LINZESS) 290 MCG CAPS CAPSULE    Take 290 mcg by mouth daily.   MIRTAZAPINE (REMERON) 45 MG TABLET    Take 45 mg by mouth at bedtime.   MULTIPLE VITAMINS-MINERALS (ICAPS AREDS FORMULA PO)    Take 1 capsule by mouth daily.    POLYETHYLENE GLYCOL (MIRALAX / GLYCOLAX) PACKET    Take 17 g by mouth daily as needed for mild constipation.   SENNOSIDES-DOCUSATE SODIUM (SENOKOT-S) 8.6-50 MG TABLET    Take 2 tablets by mouth 2 (two) times daily.    SERTRALINE (ZOLOFT) 100 MG TABLET    Take 200 mg by mouth every evening.    TESTOSTERONE ([name]) 50 MG/5GM GEL    Place 5 g onto the skin every morning.    TOPIRAMATE (TOPAMAX) 25 MG TABLET    Take 75 mg by mouth daily.   VITAMIN B-12 (CYANOCOBALAMIN) 1000 MCG TABLET    Take 1,000 mcg by mouth every morning.   Modified Medications   No medications on file  Discontinued Medications   No medications on file     Review of Systems  Constitutional: Positive for activity change and fatigue. Negative for fever, chills, diaphoresis, appetite change and unexpected weight change.  HENT: Positive for hearing loss. Negative for sore throat, trouble swallowing and voice change.   Eyes: Positive for visual disturbance (corrective lenses).  Cardiovascular: Positive for leg swelling. Negative for chest pain and palpitations.  Gastrointestinal: Positive for constipation. Negative for nausea, vomiting,  abdominal pain and abdominal distention.  Endocrine:       Hypothyroid. Testosterone deficiency.  Genitourinary: Positive for urgency, frequency, enuresis and difficulty urinating. Negative for dysuria, hematuria, flank pain and discharge.  Musculoskeletal: Positive for arthralgias, back pain and gait problem.       Unstable gait and hx falls  Skin: Negative.   Neurological: Positive for tremors,  weakness and headaches. Negative for dizziness, seizures, syncope, facial asymmetry, light-headedness and numbness.       Recent altered mentation when ill. Mild dementia.  Hematological: Negative.   Psychiatric/Behavioral: Negative for agitation. The patient is nervous/anxious.        Hx depression    Filed Vitals:   09/13/13 1615  BP: 120/64  Pulse: 62  Temp: 98.6 F (37 C)  Resp: 20  Height: 6' 1" (1.854 m)  Weight: 147 lb (66.679 kg)  SpO2: 95%   Body mass index is 19.4 kg/(m^2).  Physical Exam  Constitutional: He is oriented to person, place, and time.  Thin, frail  HENT:  Head: Normocephalic and atraumatic.  Hearing loss  Eyes: Conjunctivae and EOM are normal. Pupils are equal, round, and reactive to light.  Neck: No JVD present. No tracheal deviation present. No thyromegaly present.  Cardiovascular: Normal rate, regular rhythm and normal heart sounds.  Exam reveals no gallop and no friction rub.   No murmur heard. Pulmonary/Chest: No respiratory distress. He has no wheezes. He has no rales. He exhibits no tenderness.  Abdominal: He exhibits no distension and no mass. There is no tenderness.  Musculoskeletal: Normal range of motion. He exhibits edema. He exhibits no tenderness.  Lymphadenopathy:    He has no cervical adenopathy.  Neurological: He is alert and oriented to person, place, and time. No cranial nerve deficit. Coordination normal.  Tremor bilaterally  Skin: No rash noted. No erythema. No pallor.  Psychiatric: He has a normal mood and affect. His behavior is normal. Thought content normal.     Labs reviewed: Admission on 09/09/2013, Discharged on 09/11/2013  Component Date Value Ref Range Status  . WBC 09/09/2013 5.7  4.0 - 10.5 K/uL Final  . RBC 09/09/2013 4.56  4.22 - 5.81 MIL/uL Final  . Hemoglobin 09/09/2013 13.8  13.0 - 17.0 g/dL Final  . HCT 09/09/2013 42.6  39.0 - 52.0 % Final  . MCV 09/09/2013 93.4  78.0 - 100.0 fL Final  . MCH 09/09/2013  30.3  26.0 - 34.0 pg Final  . MCHC 09/09/2013 32.4  30.0 - 36.0 g/dL Final  . RDW 09/09/2013 13.6  11.5 - 15.5 % Final  . Platelets 09/09/2013 165  150 - 400 K/uL Final  . Lactic Acid, Venous 09/09/2013 1.58  0.5 - 2.2 mmol/L Final  . Sodium 09/09/2013 141  137 - 147 mEq/L Final  . Potassium 09/09/2013 4.7  3.7 - 5.3 mEq/L Final  . Chloride 09/09/2013 107  96 - 112 mEq/L Final  . CO2 09/09/2013 20  19 - 32 mEq/L Final  . Glucose, Bld 09/09/2013 79  70 - 99 mg/dL Final  . BUN 09/09/2013 38* 6 - 23 mg/dL Final  . Creatinine, Ser 09/09/2013 1.29  0.50 - 1.35 mg/dL Final  . Calcium 09/09/2013 8.5  8.4 - 10.5 mg/dL Final  . Total Protein 09/09/2013 6.1  6.0 - 8.3 g/dL Final  . Albumin 09/09/2013 3.0* 3.5 - 5.2 g/dL Final  . AST 09/09/2013 29  0 - 37 U/L Final  . ALT 09/09/2013 31  0 - 53 U/L Final  .   Alkaline Phosphatase 09/09/2013 53  39 - 117 U/L Final  . Total Bilirubin 09/09/2013 <0.2* 0.3 - 1.2 mg/dL Final  . GFR calc non Af Amer 09/09/2013 51* >90 mL/min Final  . GFR calc Af Amer 09/09/2013 59* >90 mL/min Final   Comment: (NOTE)                          The eGFR has been calculated using the CKD EPI equation.                          This calculation has not been validated in all clinical situations.                          eGFR's persistently <90 mL/min signify possible Chronic Kidney                          Disease.  . Anion gap 09/09/2013 14  5 - 15 Final  . Color, Urine 09/09/2013 YELLOW  YELLOW Final  . APPearance 09/09/2013 CLOUDY* CLEAR Final  . Specific Gravity, Urine 09/09/2013 1.021  1.005 - 1.030 Final  . pH 09/09/2013 5.0  5.0 - 8.0 Final  . Glucose, UA 09/09/2013 NEGATIVE  NEGATIVE mg/dL Final  . Hgb urine dipstick 09/09/2013 NEGATIVE  NEGATIVE Final  . Bilirubin Urine 09/09/2013 NEGATIVE  NEGATIVE Final  . Ketones, ur 09/09/2013 NEGATIVE  NEGATIVE mg/dL Final  . Protein, ur 09/09/2013 NEGATIVE  NEGATIVE mg/dL Final  . Urobilinogen, UA 09/09/2013 0.2  0.0 - 1.0  mg/dL Final  . Nitrite 09/09/2013 NEGATIVE  NEGATIVE Final  . Leukocytes, UA 09/09/2013 NEGATIVE  NEGATIVE Final   MICROSCOPIC NOT DONE ON URINES WITH NEGATIVE PROTEIN, BLOOD, LEUKOCYTES, NITRITE, OR GLUCOSE <1000 mg/dL.  Marland Kitchen Ammonia 09/09/2013 18  11 - 60 umol/L Final  . Troponin I 09/09/2013 <0.30  <0.30 ng/mL Final   Comment:                                 Due to the release kinetics of cTnI,                          a negative result within the first hours                          of the onset of symptoms does not rule out                          myocardial infarction with certainty.                          If myocardial infarction is still suspected,                          repeat the test at appropriate intervals.  . Magnesium 09/10/2013 1.7  1.5 - 2.5 mg/dL Final  . Phosphorus 09/10/2013 2.7  2.3 - 4.6 mg/dL Final  . TSH 09/10/2013 4.390  0.350 - 4.500 uIU/mL Final  . Sodium 09/10/2013 139  137 - 147 mEq/L Final  . Potassium 09/10/2013 4.8  3.7 - 5.3 mEq/L Final  . Chloride  09/10/2013 107  96 - 112 mEq/L Final  . CO2 09/10/2013 20  19 - 32 mEq/L Final  . Glucose, Bld 09/10/2013 85  70 - 99 mg/dL Final  . BUN 09/10/2013 34* 6 - 23 mg/dL Final  . Creatinine, Ser 09/10/2013 1.17  0.50 - 1.35 mg/dL Final  . Calcium 09/10/2013 8.3* 8.4 - 10.5 mg/dL Final  . Total Protein 09/10/2013 5.9* 6.0 - 8.3 g/dL Final  . Albumin 09/10/2013 2.8* 3.5 - 5.2 g/dL Final  . AST 09/10/2013 23  0 - 37 U/L Final  . ALT 09/10/2013 27  0 - 53 U/L Final  . Alkaline Phosphatase 09/10/2013 48  39 - 117 U/L Final  . Total Bilirubin 09/10/2013 0.2* 0.3 - 1.2 mg/dL Final  . GFR calc non Af Amer 09/10/2013 57* >90 mL/min Final  . GFR calc Af Amer 09/10/2013 67* >90 mL/min Final   Comment: (NOTE)                          The eGFR has been calculated using the CKD EPI equation.                          This calculation has not been validated in all clinical situations.                          eGFR's  persistently <90 mL/min signify possible Chronic Kidney                          Disease.  . Anion gap 09/10/2013 12  5 - 15 Final  . WBC 09/10/2013 7.3  4.0 - 10.5 K/uL Final  . RBC 09/10/2013 4.46  4.22 - 5.81 MIL/uL Final  . Hemoglobin 09/10/2013 13.2  13.0 - 17.0 g/dL Final  . HCT 09/10/2013 41.1  39.0 - 52.0 % Final  . MCV 09/10/2013 92.2  78.0 - 100.0 fL Final  . MCH 09/10/2013 29.6  26.0 - 34.0 pg Final  . MCHC 09/10/2013 32.1  30.0 - 36.0 g/dL Final  . RDW 09/10/2013 13.5  11.5 - 15.5 % Final  . Platelets 09/10/2013 165  150 - 400 K/uL Final  . Troponin I 09/10/2013 <0.30  <0.30 ng/mL Final   Comment:                                 Due to the release kinetics of cTnI,                          a negative result within the first hours                          of the onset of symptoms does not rule out                          myocardial infarction with certainty.                          If myocardial infarction is still suspected,                          repeat   the test at appropriate intervals.  . Troponin I 09/10/2013 <0.30  <0.30 ng/mL Final   Comment:                                 Due to the release kinetics of cTnI,                          a negative result within the first hours                          of the onset of symptoms does not rule out                          myocardial infarction with certainty.                          If myocardial infarction is still suspected,                          repeat the test at appropriate intervals.  . Troponin I 09/10/2013 <0.30  <0.30 ng/mL Final   Comment:                                 Due to the release kinetics of cTnI,                          a negative result within the first hours                          of the onset of symptoms does not rule out                          myocardial infarction with certainty.                          If myocardial infarction is still suspected,                          repeat  the test at appropriate intervals.  . MRSA by PCR 09/10/2013 POSITIVE* NEGATIVE Final   Comment:                                 The GeneXpert MRSA Assay (FDA                          approved for NASAL specimens                          only), is one component of a                          comprehensive MRSA colonization                          surveillance program. It is not                            intended to diagnose MRSA                          infection nor to guide or                          monitor treatment for                          MRSA infections.                          RESULT CALLED TO, READ BACK BY AND VERIFIED WITH:                          CALLED TO RN VALORIE CONLEY [id number]  THANEY  . Cortisol - AM 09/10/2013 7.1  4.3 - 22.4 ug/dL Final   Performed at [facility]  . Glucose-Capillary 09/11/2013 116* 70 - 99 mg/dL Final  . Glucose-Capillary 09/11/2013 86  70 - 99 mg/dL Final  Admission on 09/07/2013, Discharged on 09/07/2013  Component Date Value Ref Range Status  . WBC 09/07/2013 5.0  4.0 - 10.5 K/uL Final  . RBC 09/07/2013 4.81  4.22 - 5.81 MIL/uL Final  . Hemoglobin 09/07/2013 14.2  13.0 - 17.0 g/dL Final  . HCT 09/07/2013 44.5  39.0 - 52.0 % Final  . MCV 09/07/2013 92.5  78.0 - 100.0 fL Final  . MCH 09/07/2013 29.5  26.0 - 34.0 pg Final  . MCHC 09/07/2013 31.9  30.0 - 36.0 g/dL Final  . RDW 09/07/2013 13.3  11.5 - 15.5 % Final  . Platelets 09/07/2013 152  150 - 400 K/uL Final  . Neutrophils Relative % 09/07/2013 68  43 - 77 % Final  . Neutro Abs 09/07/2013 3.3  1.7 - 7.7 K/uL Final  . Lymphocytes Relative 09/07/2013 20  12 - 46 % Final  . Lymphs Abs 09/07/2013 1.0  0.7 - 4.0 K/uL Final  . Monocytes Relative 09/07/2013 9  3 - 12 % Final  . Monocytes Absolute 09/07/2013 0.4  0.1 - 1.0 K/uL Final  . Eosinophils Relative 09/07/2013 3  0 - 5 % Final  . Eosinophils Absolute 09/07/2013 0.2  0.0 - 0.7 K/uL Final  . Basophils Relative 09/07/2013 0   0 - 1 % Final  . Basophils Absolute 09/07/2013 0.0  0.0 - 0.1 K/uL Final  . Sodium 09/07/2013 140  137 - 147 mEq/L Final  . Potassium 09/07/2013 4.2  3.7 - 5.3 mEq/L Final  . Chloride 09/07/2013 105  96 - 112 mEq/L Final  . CO2 09/07/2013 25  19 - 32 mEq/L Final  . Glucose, Bld 09/07/2013 85  70 - 99 mg/dL Final  . BUN 09/07/2013 38* 6 - 23 mg/dL Final  . Creatinine, Ser 09/07/2013 1.40* 0.50 - 1.35 mg/dL Final  . Calcium 09/07/2013 9.0  8.4 - 10.5 mg/dL Final  . Total Protein 09/07/2013 6.4  6.0 - 8.3 g/dL Final  . Albumin 09/07/2013 3.2* 3.5 - 5.2 g/dL Final  . AST 09/07/2013 29  0 - 37 U/L Final  . ALT 09/07/2013 26  0 - 53 U/L Final  . Alkaline Phosphatase 09/07/2013 53  39 - 117 U/L Final  . Total Bilirubin 09/07/2013 0.3  0.3 - 1.2 mg/dL Final  . GFR calc non Af Amer 09/07/2013 46* >90 mL/min Final  .  GFR calc Af Amer 09/07/2013 54* >90 mL/min Final   Comment: (NOTE)                          The eGFR has been calculated using the CKD EPI equation.                          This calculation has not been validated in all clinical situations.                          eGFR's persistently <90 mL/min signify possible Chronic Kidney                          Disease.  . Anion gap 09/07/2013 10  5 - 15 Final  . Troponin i, poc 09/07/2013 0.01  0.00 - 0.08 ng/mL Final  . Comment 3 09/07/2013          Final   Comment: Due to the release kinetics of cTnI,                          a negative result within the first hours                          of the onset of symptoms does not rule out                          myocardial infarction with certainty.                          If myocardial infarction is still suspected,                          repeat the test at appropriate intervals.  . Lactic Acid, Venous 09/07/2013 0.59  0.5 - 2.2 mmol/L Final  . Color, Urine 09/07/2013 YELLOW  YELLOW Final  . APPearance 09/07/2013 CLEAR  CLEAR Final  . Specific Gravity, Urine 09/07/2013 1.020  1.005 -  1.030 Final  . pH 09/07/2013 6.5  5.0 - 8.0 Final  . Glucose, UA 09/07/2013 NEGATIVE  NEGATIVE mg/dL Final  . Hgb urine dipstick 09/07/2013 NEGATIVE  NEGATIVE Final  . Bilirubin Urine 09/07/2013 NEGATIVE  NEGATIVE Final  . Ketones, ur 09/07/2013 NEGATIVE  NEGATIVE mg/dL Final  . Protein, ur 09/07/2013 NEGATIVE  NEGATIVE mg/dL Final  . Urobilinogen, UA 09/07/2013 0.2  0.0 - 1.0 mg/dL Final  . Nitrite 09/07/2013 NEGATIVE  NEGATIVE Final  . Leukocytes, UA 09/07/2013 SMALL* NEGATIVE Final  . Specimen Description 09/07/2013 URINE, CLEAN CATCH   Final  . Special Requests 09/07/2013 NONE   Final  . Culture  Setup Time 09/07/2013    Final                   Value:09/07/2013 21:34                         Performed at [facility]  . Colony Count 09/07/2013    Final                   Value:NO GROWTH  Performed at [facility]  . Culture 09/07/2013    Final                   Value:NO GROWTH                         Performed at [facility]  . Report Status 09/07/2013 09/08/2013 FINAL   Final  . WBC, UA 09/07/2013 3-6  <3 WBC/hpf Final  Admission on 09/05/2013, Discharged on 09/05/2013  Component Date Value Ref Range Status  . WBC 09/05/2013 6.3  4.0 - 10.5 K/uL Final  . RBC 09/05/2013 4.98  4.22 - 5.81 MIL/uL Final  . Hemoglobin 09/05/2013 15.0  13.0 - 17.0 g/dL Final  . HCT 09/05/2013 46.3  39.0 - 52.0 % Final  . MCV 09/05/2013 93.0  78.0 - 100.0 fL Final  . MCH 09/05/2013 30.1  26.0 - 34.0 pg Final  . MCHC 09/05/2013 32.4  30.0 - 36.0 g/dL Final  . RDW 09/05/2013 13.3  11.5 - 15.5 % Final  . Platelets 09/05/2013 163  150 - 400 K/uL Final  . Neutrophils Relative % 09/05/2013 68  43 - 77 % Final  . Neutro Abs 09/05/2013 4.2  1.7 - 7.7 K/uL Final  . Lymphocytes Relative 09/05/2013 18  12 - 46 % Final  . Lymphs Abs 09/05/2013 1.1  0.7 - 4.0 K/uL Final  . Monocytes Relative 09/05/2013 10  3 - 12 % Final  . Monocytes Absolute 09/05/2013 0.6  0.1 -  1.0 K/uL Final  . Eosinophils Relative 09/05/2013 4  0 - 5 % Final  . Eosinophils Absolute 09/05/2013 0.3  0.0 - 0.7 K/uL Final  . Basophils Relative 09/05/2013 0  0 - 1 % Final  . Basophils Absolute 09/05/2013 0.0  0.0 - 0.1 K/uL Final  . Sodium 09/05/2013 142  137 - 147 mEq/L Final  . Potassium 09/05/2013 4.8  3.7 - 5.3 mEq/L Final  . Chloride 09/05/2013 106  96 - 112 mEq/L Final  . CO2 09/05/2013 27  19 - 32 mEq/L Final  . Glucose, Bld 09/05/2013 81  70 - 99 mg/dL Final  . BUN 09/05/2013 35* 6 - 23 mg/dL Final  . Creatinine, Ser 09/05/2013 1.21  0.50 - 1.35 mg/dL Final  . Calcium 09/05/2013 9.6  8.4 - 10.5 mg/dL Final  . Total Protein 09/05/2013 6.8  6.0 - 8.3 g/dL Final  . Albumin 09/05/2013 3.5  3.5 - 5.2 g/dL Final  . AST 09/05/2013 37  0 - 37 U/L Final  . ALT 09/05/2013 27  0 - 53 U/L Final  . Alkaline Phosphatase 09/05/2013 57  39 - 117 U/L Final  . Total Bilirubin 09/05/2013 0.3  0.3 - 1.2 mg/dL Final  . GFR calc non Af Amer 09/05/2013 55* >90 mL/min Final  . GFR calc Af Amer 09/05/2013 64* >90 mL/min Final   Comment: (NOTE)                          The eGFR has been calculated using the CKD EPI equation.                          This calculation has not been validated in all clinical situations.                          eGFR's persistently <90   mL/min signify possible Chronic Kidney                          Disease.  . Anion gap 09/05/2013 9  5 - 15 Final  . Color, Urine 09/05/2013 YELLOW  YELLOW Final  . APPearance 09/05/2013 CLOUDY* CLEAR Final  . Specific Gravity, Urine 09/05/2013 1.010  1.005 - 1.030 Final  . pH 09/05/2013 7.0  5.0 - 8.0 Final  . Glucose, UA 09/05/2013 NEGATIVE  NEGATIVE mg/dL Final  . Hgb urine dipstick 09/05/2013 TRACE* NEGATIVE Final  . Bilirubin Urine 09/05/2013 NEGATIVE  NEGATIVE Final  . Ketones, ur 09/05/2013 NEGATIVE  NEGATIVE mg/dL Final  . Protein, ur 09/05/2013 NEGATIVE  NEGATIVE mg/dL Final  . Urobilinogen, UA 09/05/2013 0.2  0.0 - 1.0  mg/dL Final  . Nitrite 09/05/2013 NEGATIVE  NEGATIVE Final  . Leukocytes, UA 09/05/2013 MODERATE* NEGATIVE Final  . Troponin I 09/05/2013 <0.30  <0.30 ng/mL Final   Comment:                                 Due to the release kinetics of cTnI,                          a negative result within the first hours                          of the onset of symptoms does not rule out                          myocardial infarction with certainty.                          If myocardial infarction is still suspected,                          repeat the test at appropriate intervals.  . Pro B Natriuretic peptide (BNP) 09/05/2013 472.2* 0 - 450 pg/mL Final  . Specimen Description 09/05/2013 URINE, CLEAN CATCH   Final  . Special Requests 09/05/2013 Normal   Final  . Culture  Setup Time 09/05/2013    Final                   Value:09/05/2013 15:35                         Performed at [facility]  . Colony Count 09/05/2013    Final                   Value:>=100,000 COLONIES/ML                         Performed at [facility]  . Culture 09/05/2013    Final                   Value:STAPHYLOCOCCUS AUREUS                         Note: RIFAMPIN AND GENTAMICIN SHOULD NOT BE USED AS SINGLE DRUGS FOR TREATMENT OF STAPH INFECTIONS.  Performed at [facility]  . Report Status 09/05/2013 09/07/2013 FINAL   Final  . Organism ID, Bacteria 09/05/2013 STAPHYLOCOCCUS AUREUS   Final  . Lactic Acid, Venous 09/05/2013 1.4  0.5 - 2.2 mmol/L Final  . Squamous Epithelial / LPF 09/05/2013 RARE  RARE Final  . WBC, UA 09/05/2013 11-20  <3 WBC/hpf Final  . RBC / HPF 09/05/2013 0-2  <3 RBC/hpf Final  . Bacteria, UA 09/05/2013 FEW* RARE Final      Assessment/Plan   1. Hip hematoma, right, initial encounter Here for short term rehab. Improving. Less discomfort and smaller hematoma.  2. Weakness Here for STR  3. Essential and other specified forms of tremor unchanged  4.  Balance problems PT  5. Essential hypertension controlled

## 2013-09-14 ENCOUNTER — Non-Acute Institutional Stay (SKILLED_NURSING_FACILITY): Payer: Medicare Other | Admitting: Nurse Practitioner

## 2013-09-14 ENCOUNTER — Other Ambulatory Visit: Payer: Self-pay | Admitting: Internal Medicine

## 2013-09-14 ENCOUNTER — Encounter: Payer: Self-pay | Admitting: *Deleted

## 2013-09-14 DIAGNOSIS — E291 Testicular hypofunction: Secondary | ICD-10-CM

## 2013-09-14 DIAGNOSIS — IMO0002 Reserved for concepts with insufficient information to code with codable children: Secondary | ICD-10-CM

## 2013-09-14 DIAGNOSIS — R5381 Other malaise: Secondary | ICD-10-CM

## 2013-09-14 DIAGNOSIS — G43909 Migraine, unspecified, not intractable, without status migrainosus: Secondary | ICD-10-CM

## 2013-09-14 DIAGNOSIS — K5900 Constipation, unspecified: Secondary | ICD-10-CM

## 2013-09-14 DIAGNOSIS — I498 Other specified cardiac arrhythmias: Secondary | ICD-10-CM

## 2013-09-14 DIAGNOSIS — R001 Bradycardia, unspecified: Secondary | ICD-10-CM

## 2013-09-14 DIAGNOSIS — S7001XS Contusion of right hip, sequela: Secondary | ICD-10-CM

## 2013-09-14 DIAGNOSIS — R5383 Other fatigue: Secondary | ICD-10-CM

## 2013-09-14 DIAGNOSIS — R531 Weakness: Secondary | ICD-10-CM

## 2013-09-14 DIAGNOSIS — N390 Urinary tract infection, site not specified: Secondary | ICD-10-CM

## 2013-09-14 DIAGNOSIS — F411 Generalized anxiety disorder: Secondary | ICD-10-CM

## 2013-09-14 DIAGNOSIS — E039 Hypothyroidism, unspecified: Secondary | ICD-10-CM

## 2013-09-14 DIAGNOSIS — K5909 Other constipation: Secondary | ICD-10-CM

## 2013-09-14 NOTE — Assessment & Plan Note (Signed)
Apparently stable on Zoloft 200mg , Wellbutrin 300mg , and Remeron 45mg . Clonopin 0.5mg  qhs and 0.25mg  bid prn.  F/u Dr. Neurology Jannifer Franklin 04/18/13

## 2013-09-14 NOTE — Assessment & Plan Note (Signed)
Supplement with Tesdosterone 50mg /5Gm-daily.

## 2013-09-14 NOTE — Assessment & Plan Note (Signed)
HR dropping into the 30s. Not on betablocker.  Chronic problem that [facility] cardiology has been following.  Cardiology consult this admission. No need for pacemaker.

## 2013-09-14 NOTE — Assessment & Plan Note (Signed)
Right hematoma.  From fall.  Will check am Hgb to ensure no further bleeding.  Hgb stable at 13.

## 2013-09-14 NOTE — Assessment & Plan Note (Signed)
Managed with laxatives. Miralax bid. Continue Lizness.

## 2013-09-14 NOTE — Assessment & Plan Note (Signed)
Secondary to orthostatic hypotension. Denies dizziness or changes and vision. Improved with IVF.  CT head is negative x 2  Troponins cycled and all within normal limits. TSH is WNL. Am Cortisol was normal. CBGs are stable without hypoglycemia.  Recent work up including carotid arteries did not reveal cause for falling.  BP remains moderately high, but decreases by 30 mmhg when he stands. (SBP 170 -> 140)  Klonopin has been decreased.  PT recommends continued SNF.  Patient remains a high fall risk.

## 2013-09-14 NOTE — Assessment & Plan Note (Signed)
Controlled with Topmax.

## 2013-09-14 NOTE — Assessment & Plan Note (Signed)
Staph in culture (7/22) No growth in culture (7/24)  Pan sensitive  Completed 6 days of Bactrim. No further antibiotic therapy.

## 2013-09-14 NOTE — Assessment & Plan Note (Signed)
Uncertain etiology- ?UTI-baseline mentation and usual state of health. Recent Urinary tract infection may have contributed.  Klonopin decreased from 0.5 mg TID to 0.25 BID PRN plus 0.25 qhs.

## 2013-09-14 NOTE — Progress Notes (Signed)
Patient ID: Jose Leach, male   DOB: [birth year], 80 y.o.   MRN: [id number]    Code Status: Full Code  Allergies  Allergen Reactions  . Latex Rash  . Tape Rash    Paper tape is ok to use     Chief Complaint  Patient presents with  . Medical Management of Chronic Issues  . Hospitalization Follow-up    HPI: Patient is a 80 y.o. male seen in the SNF at [facility]  today for evaluation of R hip hematoma and other chronic medical conditions.     ED 09/05/13-evaluate s/p fallen twice in a couple of days at his independent living apartment.  He was started on Septra for possibility of urinary tract infection returned to his independent living apartment. Patient had more falls and was admitted to the skilled nursing facility level of [facility] on 09/07/13.   While in the SNF level, he had another fall and was returned to the hospital on 09/09/13. A large right hip hematoma. He had generalized weakness. He was stabilized and returned to the skilled nursing facility at [facility] on 09/11/13.   Problem List Items Addressed This Visit   HYPOTHYROIDISM     Takes Levothyroxine 1mcg daily except Sunday 74mcg. Last TSH wnl while in hospital.       ANXIETY     Apparently stable on Zoloft 200mg , Wellbutrin 300mg , and Remeron 45mg . Clonopin 0.5mg  qhs and 0.25mg  bid prn.  F/u Dr. Neurology Jannifer Franklin 04/18/13      MIGRAINE HEADACHE     Controlled with Topmax.     Chronic constipation     Managed with laxatives. Miralax bid. Continue Lizness.        Weakness - Primary      Secondary to orthostatic hypotension. Denies dizziness or changes and vision. Improved with IVF.  CT head is negative x 2  Troponins cycled and all within normal limits. TSH is WNL. Am Cortisol was normal. CBGs are stable without hypoglycemia.  Recent work up including carotid arteries did not reveal cause for falling.  BP remains moderately high, but decreases by 30 mmhg when he stands. (SBP 170 ->  140)  Klonopin has been decreased.  PT recommends continued SNF.  Patient remains a high fall risk.      Urinary tract infection, site not specified     Staph in culture (7/22) No growth in culture (7/24)  Pan sensitive  Completed 6 days of Bactrim. No further antibiotic therapy.      Hip hematoma, right     Right hematoma.  From fall.  Will check am Hgb to ensure no further bleeding.  Hgb stable at 13.          Bradycardia     HR dropping into the 30s. Not on betablocker.  Chronic problem that [facility] cardiology has been following.  Cardiology consult this admission. No need for pacemaker.        Other testicular hypofunction     Supplement with Tesdosterone 50mg /5Gm-daily.        Review of Systems:  Review of Systems  Constitutional: Positive for malaise/fatigue. Negative for fever, chills, weight loss and diaphoresis.  HENT: Positive for hearing loss. Negative for congestion, ear discharge, ear pain, nosebleeds, sore throat and tinnitus.   Eyes: Negative for blurred vision, double vision, photophobia, pain, discharge and redness.  Respiratory: Negative for cough, hemoptysis, sputum production, shortness of breath, wheezing and stridor.   Cardiovascular: Positive for PND. Negative  for chest pain, palpitations, orthopnea, claudication and leg swelling.  Gastrointestinal: Negative for heartburn, nausea, vomiting, abdominal pain, diarrhea, constipation, blood in stool and melena.  Genitourinary: Negative for dysuria, urgency, frequency, hematuria and flank pain.  Musculoskeletal: Positive for back pain, falls, joint pain and neck pain. Negative for myalgias.  Skin: Negative for itching and rash.       R hip hematoma  Neurological: Positive for sensory change, speech change (low voice), weakness and headaches (treated). Negative for dizziness, tingling, tremors, focal weakness, seizures and loss of consciousness.  Endo/Heme/Allergies: Negative for environmental  allergies and polydipsia. Does not bruise/bleed easily.  Psychiatric/Behavioral: Positive for depression and memory loss. Negative for suicidal ideas, hallucinations and substance abuse. The patient is nervous/anxious. The patient does not have insomnia.      Past Medical History  Diagnosis Date  . Migraines     Dr Jannifer Franklin  . Peripheral neuropathy   . Benign essential tremor   . Cervical stenosis of spine   . Anemia     B12 deficiency  . Testosterone deficiency     Dr Lawerance Bach, [facility]  . Hypothyroidism   . Hyperlipidemia   . Depression     Dr Albertine Patricia  . History of shingles 2009  . H/O cardiovascular stress test     a. 02/2003 -> no ischemia/infarct.  Marland Kitchen Hx of echocardiogram     Echocardiogram 08/01/12: Mild LVH, EF 55-60%, normal wall motion.  Marland Kitchen PAD (peripheral artery disease)   . Carotid stenosis     s/p L CEA  . Fecal impaction 10/11/2012  . Macular edema   . Cervical spinal stenosis   . Hematoma of leg   . Acute urinary retention 01/28/2013  . AKI (acute kidney injury) 01/26/2013  . Altered mental status 09/09/2013  . ANXIETY 06/07/2007    Qualifier: Diagnosis of  By: Talbert Cage CMA ([place]), June    . ARTHRITIS 06/07/2007    Qualifier: Diagnosis of  By: Talbert Cage CMA ([place]), June    . Balance problems 04/05/2011  . Benign fibroma of prostate 10/11/2011  . Bilateral inguinal hernia 10/09/2012  . Bilateral leg edema 09/13/2012  . Bladder retention 02/05/2013  . Bradycardia, sinus 08/02/2012  . 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 CAROTID ARTERY DISEASE 03/31/2007    Qualifier: Diagnosis of  By: Linna Darner MD, Gwyndolyn Saxon    . Degeneration macular 11/02/2011  . Difficulty in walking(719.7) 04/05/2011  . ESOPHAGITIS 08/06/2002    Qualifier: Diagnosis of  By: Talbert Cage CMA ([place]), June    . Essential and other specified forms of tremor 04/18/2013  . Family history of colon cancer 07/24/2012  . Femoral hernia, bilateral s/p lap repair 01/16/2013 01/16/2013  . HAMMER TOE 04/23/2010    Qualifier:  Diagnosis of  By: Linna Darner MD, Gwyndolyn Saxon    . Hip hematoma, right 09/09/2013  . HTN (hypertension) 08/22/2012  . Hydrocele 10/11/2011  . 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 MORTON'S NEUROMA, LEFT 08/22/2007    Qualifier: Diagnosis of  By: Linna Darner MD, Gwyndolyn Saxon    . Personal history of colonic polyps 01/05/2013    2014 2 mm adenomatous polyp   . PREMATURE ATRIAL CONTRACTIONS 03/14/2006    Qualifier: Diagnosis of  By: Linna Darner MD, Gwendolyn Lima 12/16/2011  . SPINAL STENOSIS 03/14/2006    Qualifier: Diagnosis of  By: Linna Darner MD, William   Cervical spine    . Unspecified vitamin D deficiency 08/06/2012  . Urge incontinence 10/09/2012  . Urinary tract infection, site not specified 02/13/2013  .  Weakness 08/02/2012   Past Surgical History  Procedure Laterality Date  . Appendectomy  1941  . Lumbar laminectomy    . Carotid endarterectomy  2005     L  . Tonsillectomy and adenoidectomy    . Total hip arthroplasty  2005    L  . Cataract extraction Right   . Eye surgery Right 02/2012    retina  . Inguinal hernia repair Bilateral 01/16/2013    Procedure: LAPAROSCOPIC BILATERAL INGUINAL DIRECT AND INDIRECT, FEMORAL, AND OBTURATOR HERNIAS;  Surgeon: Adin Hector, MD;  Location: [place];  Service: General;  Laterality: Bilateral;  . Insertion of mesh N/A 01/16/2013    Procedure: INSERTION OF MESH;  Surgeon: Adin Hector, MD;  Location: [place];  Service: General;  Laterality: N/A;  . Breast cyst excision Left 01/16/2013    Procedure: MASS EXCISION AXILLA;  Surgeon: Adin Hector, MD;  Location: [place];  Service: General;  Laterality: Left;   Social History:   reports that he quit smoking about 34 years ago. His smoking use included Pipe and Cigars. He has never used smokeless tobacco. He reports that he drinks about 4.2 ounces of alcohol per week. He reports that he does not use illicit drugs.  Family History  Problem Relation Age of Onset  . Stroke Mother 21  . Hypertension Mother   . Diabetes  Mother   . Heart disease Mother   . Rectal cancer Father 66  . Heart disease Father     in 30s  . Cancer Father     colorectal  . Nephritis Sister   . Seizures Brother     died as child    Medications: Patient's Medications  New Prescriptions   No medications on file  Previous Medications   ACETAMINOPHEN (TYLENOL) 325 MG TABLET    Take 2 tablets (650 mg total) by mouth every 6 (six) hours as needed for mild pain (or Fever >/= 101).   ASPIRIN 81 MG TABLET    Take 81 mg by mouth every morning.    ATORVASTATIN (LIPITOR) 20 MG TABLET    TAKE 1 TABLET BY MOUTH EVERY DAY AS DIRECTED   B COMPLEX-C (B-COMPLEX WITH VITAMIN C) TABLET    Take 1 tablet by mouth every morning.   BIMATOPROST (LUMIGAN) 0.01 % SOLN    Place 1 drop into both eyes at bedtime.   BRIMONIDINE (ALPHAGAN) 0.15 % OPHTHALMIC SOLUTION    Place 1 drop into the right eye 2 (two) times daily.   BUPROPION (WELLBUTRIN XL) 300 MG 24 HR TABLET    Take 300 mg by mouth every morning.    CHOLECALCIFEROL (VITAMIN D) 1000 UNITS TABLET    Take 2,000 Units by mouth 2 (two) times daily.    CLONAZEPAM (KLONOPIN) 0.5 MG TABLET    Take 0.5 tablets (0.25 mg total) by mouth 2 (two) times daily as needed (anxiety, agitation).   CLONAZEPAM (KLONOPIN) 0.5 MG TABLET    Take 1 tablet (0.5 mg total) by mouth at bedtime.   DOCUSATE SODIUM 100 MG CAPS    Take 100 mg by mouth 2 (two) times daily.   FLAXSEED, LINSEED, (FLAX SEEDS PO)    Take 1 tablet by mouth every morning.    HYDROCODONE-ACETAMINOPHEN (NORCO/VICODIN) 5-325 MG PER TABLET    Take 1 tablet by mouth every 4 (four) hours as needed for moderate pain.   KRILL OIL 500 MG CAPS    Take 500 mg by mouth every morning.  L-METHYLFOLATE 7.5 MG TABS    Take 7.5 mg by mouth daily.   LACTOSE FREE NUTRITION (BOOST PLUS) LIQD    Take 237 mLs by mouth 3 (three) times daily with meals.   LEVOTHYROXINE (SYNTHROID, LEVOTHROID) 50 MCG TABLET    Take 0.5-1 tablets (25-50 mcg total) by mouth every morning. Take  1/2 tab (28mcg) on Sundays, 1 tab (79mcg) on all other days.   LINACLOTIDE (LINZESS) 290 MCG CAPS CAPSULE    Take 290 mcg by mouth daily.   MIRTAZAPINE (REMERON) 45 MG TABLET    Take 45 mg by mouth at bedtime.   MULTIPLE VITAMINS-MINERALS (ICAPS AREDS FORMULA PO)    Take 1 capsule by mouth daily.    POLYETHYLENE GLYCOL (MIRALAX / GLYCOLAX) PACKET    Take 17 g by mouth daily as needed for mild constipation.   SENNOSIDES-DOCUSATE SODIUM (SENOKOT-S) 8.6-50 MG TABLET    Take 2 tablets by mouth 2 (two) times daily.    SERTRALINE (ZOLOFT) 100 MG TABLET    Take 200 mg by mouth every evening.    TESTOSTERONE ([name]) 50 MG/5GM GEL    Place 5 g onto the skin every morning.    TOPIRAMATE (TOPAMAX) 25 MG TABLET    Take 75 mg by mouth daily.   VITAMIN B-12 (CYANOCOBALAMIN) 1000 MCG TABLET    Take 1,000 mcg by mouth every morning.   Modified Medications   No medications on file  Discontinued Medications   No medications on file     Physical Exam: Physical Exam  Constitutional: He is oriented to person, place, and time. He appears well-developed and well-nourished. No distress.  HENT:  Head: Normocephalic and atraumatic.  Right Ear: External ear normal.  Left Ear: External ear normal.  Nose: Nose normal.  Mouth/Throat: Oropharynx is clear and moist. No oropharyngeal exudate.  Eyes: Conjunctivae and EOM are normal. Pupils are equal, round, and reactive to light. Right eye exhibits no discharge. Left eye exhibits no discharge. No scleral icterus.  Neck: Normal range of motion. Neck supple. No JVD present. No tracheal deviation present. No thyromegaly present.  Cardiovascular: Normal rate, regular rhythm, normal heart sounds and intact distal pulses.   No murmur heard. Pulmonary/Chest: Effort normal and breath sounds normal. No stridor. No respiratory distress. He has no wheezes. He has no rales. He exhibits no tenderness.  Abdominal: Soft. Bowel sounds are normal. He exhibits no distension. There is  no tenderness. There is no rebound and no guarding.  Musculoskeletal: Normal range of motion. He exhibits no edema and no tenderness.  Lymphadenopathy:    He has no cervical adenopathy.  Neurological: He is alert and oriented to person, place, and time. He has normal reflexes. No cranial nerve deficit. He exhibits normal muscle tone. Coordination normal.  Skin: Skin is warm and dry. No rash noted. He is not diaphoretic. No erythema. No pallor.  R hip hematoma.   Psychiatric: His mood appears anxious. His affect is not angry, not blunt, not labile and not inappropriate. His speech is not rapid and/or pressured, not delayed, not tangential and not slurred. He is not agitated, not aggressive, not hyperactive, not slowed, not withdrawn, not actively hallucinating and not combative. Thought content is not paranoid and not delusional. Cognition and memory are impaired. He does not express impulsivity or inappropriate judgment. He exhibits a depressed mood. He expresses no homicidal and no suicidal ideation. He expresses no suicidal plans and no homicidal plans. He is communicative. He exhibits abnormal recent memory. He exhibits  normal remote memory. He is attentive.    Filed Vitals:   09/14/13 1630  BP: 137/79  Pulse: 53  Temp: 97.2 F (36.2 C)  TempSrc: Tympanic  Resp: 20      Labs reviewed: Basic Metabolic Panel:  Recent Labs  01/26/13 2210  09/07/13 1209 09/09/13 1807 09/10/13 0139  NA  --   < > 140 141 139  K  --   < > 4.2 4.7 4.8  CL  --   < > 105 107 107  CO2  --   < > 25 20 20   GLUCOSE  --   < > 85 79 85  BUN  --   < > 38* 38* 34*  CREATININE  --   < > 1.40* 1.29 1.17  CALCIUM  --   < > 9.0 8.5 8.3*  MG  --   --   --   --  1.7  PHOS  --   --   --   --  2.7  TSH 2.756  --   --   --  4.390  < > = values in this interval not displayed. Liver Function Tests:  Recent Labs  09/07/13 1209 09/09/13 1807 09/10/13 0139  AST 29 29 23   ALT 26 31 27   ALKPHOS 53 53 48    BILITOT 0.3 <0.2* 0.2*  PROT 6.4 6.1 5.9*  ALBUMIN 3.2* 3.0* 2.8*    Recent Labs  01/26/13 1900  LIPASE 15  CBC:  Recent Labs  01/26/13 1855 09/05/13 1206 09/07/13 1209 09/09/13 1807 09/10/13 0139  WBC 6.6 6.3 5.0 5.7 7.3  NEUTROABS 4.9 4.2 3.3  --   --   HGB 11.4* 15.0 14.2 13.8 13.2  HCT 35.2* 46.3 44.5 42.6 41.1  MCV 91.2 93.0 92.5 93.4 92.2  PLT 196 163 152 165 165   No results found for this basename: FOLATE, IRON, VITAMINB12,  in the last 8760 hours  Past Procedures:  08/02/12 2D echocardiogram:  LV EF: 55% -   60%  07/30/12 CT head  IMPRESSION: 1.  No acute intracranial abnormalities. 2.  Mild cerebral and cerebellar atrophy with chronic microvascular ischemic changes of the cerebral white matter, similar to the prior Study.  08/01/12 MRI brain:  IMPRESSION: Motion degraded exam.   No acute infarct.   Moderate small vessel disease type changes.   Global atrophy.   09/05/13 MRI head w/o CM:  IMPRESSION:  No acute infarction. Extensive and widespread chronic small-vessel  ischemic changes throughout the brain.   09/09/13 X-ray Pelvis  IMPRESSION:  No acute abnormality. Moderately severe arthritis of the right hip  with chondrocalcinosis.  09/09/13 CXR  IMPRESSION:  1. Mild elevation of the right hemidiaphragm is new compared to the  prior study, but is of uncertain the etiology and significance.  09/09/13 CT head w/o contrast:  IMPRESSION:  No acute intracranial abnormality. Chronic atrophy and chronic small  vessel ischemic disease with tiny lacunar infarcts, stable.  09/09/13 Echocardiogram LV EF: 65%  Assessment/Plan Weakness  Secondary to orthostatic hypotension. Denies dizziness or changes and vision. Improved with IVF.  CT head is negative x 2  Troponins cycled and all within normal limits. TSH is WNL. Am Cortisol was normal. CBGs are stable without hypoglycemia.  Recent work up including carotid arteries did not reveal cause  for falling.  BP remains moderately high, but decreases by 30 mmhg when he stands. (SBP 170 -> 140)  Klonopin has been decreased.  PT recommends continued SNF.  Patient remains a high fall risk.    Altered mental status Uncertain etiology- ?UTI-baseline mentation and usual state of health. Recent Urinary tract infection may have contributed.  Klonopin decreased from 0.5 mg TID to 0.25 BID PRN plus 0.25 qhs.    Bradycardia HR dropping into the 30s. Not on betablocker.  Chronic problem that [facility] cardiology has been following.  Cardiology consult this admission. No need for pacemaker.      Urinary tract infection, site not specified Staph in culture (7/22) No growth in culture (7/24)  Pan sensitive  Completed 6 days of Bactrim. No further antibiotic therapy.    Hip hematoma, right Right hematoma.  From fall.  Will check am Hgb to ensure no further bleeding.  Hgb stable at 13.        HYPOTHYROIDISM Takes Levothyroxine 9mcg daily except Sunday 69mcg. Last TSH wnl while in hospital.     ANXIETY Apparently stable on Zoloft 200mg , Wellbutrin 300mg , and Remeron 45mg . Clonopin 0.5mg  qhs and 0.25mg  bid prn.  F/u Dr. Neurology Jannifer Franklin 04/18/13    Chronic constipation Managed with laxatives. Miralax bid. Continue Lizness.      MIGRAINE HEADACHE Controlled with Topmax.   Other testicular hypofunction Supplement with Tesdosterone 50mg /5Gm-daily.     Family/ Staff Communication: discharge home with in home PT/OT  Goals of Care: [place]  Labs/tests ordered: none

## 2013-09-14 NOTE — Assessment & Plan Note (Signed)
Takes Levothyroxine 40mcg daily except Sunday 13mcg. Last TSH wnl while in hospital.

## 2013-09-17 ENCOUNTER — Other Ambulatory Visit: Payer: Self-pay | Admitting: Gastroenterology

## 2013-09-18 ENCOUNTER — Non-Acute Institutional Stay (SKILLED_NURSING_FACILITY): Payer: Medicare Other | Admitting: Nurse Practitioner

## 2013-09-18 ENCOUNTER — Encounter: Payer: Self-pay | Admitting: Nurse Practitioner

## 2013-09-18 DIAGNOSIS — IMO0001 Reserved for inherently not codable concepts without codable children: Secondary | ICD-10-CM

## 2013-09-18 DIAGNOSIS — E291 Testicular hypofunction: Secondary | ICD-10-CM

## 2013-09-18 DIAGNOSIS — G43909 Migraine, unspecified, not intractable, without status migrainosus: Secondary | ICD-10-CM

## 2013-09-18 DIAGNOSIS — E039 Hypothyroidism, unspecified: Secondary | ICD-10-CM

## 2013-09-18 DIAGNOSIS — K5900 Constipation, unspecified: Secondary | ICD-10-CM

## 2013-09-18 DIAGNOSIS — S7001XS Contusion of right hip, sequela: Secondary | ICD-10-CM

## 2013-09-18 DIAGNOSIS — M7918 Myalgia, other site: Secondary | ICD-10-CM

## 2013-09-18 DIAGNOSIS — K5909 Other constipation: Secondary | ICD-10-CM

## 2013-09-18 DIAGNOSIS — IMO0002 Reserved for concepts with insufficient information to code with codable children: Secondary | ICD-10-CM

## 2013-09-18 DIAGNOSIS — F411 Generalized anxiety disorder: Secondary | ICD-10-CM

## 2013-09-18 NOTE — Assessment & Plan Note (Signed)
Controlled with Topmax.

## 2013-09-18 NOTE — Assessment & Plan Note (Signed)
Supplemented with Testosterone 50mg  topically qd.

## 2013-09-18 NOTE — Assessment & Plan Note (Signed)
Right hematoma.  From fall.  Will check am Hgb to ensure no further bleeding.  Hgb stable at 13.

## 2013-09-18 NOTE — Assessment & Plan Note (Addendum)
09/09/13 X-ray Pelvis IMPRESSION:  No acute abnormality. Moderately severe arthritis of the right hip  with chondrocalcinosis  May consider  X-ray L spine and pelvis if no better The patient stated its chronic with sacral coccygeal area beefy redness and superficial skin missing spots-treated by the Dermatology-likes to resume "medicated cream" prescribed by the Dermatologist prior.  He stated his pain is located in his buttocks-doesn't travel down to his legs. Pain is not associated with truncal or BLE movements  Continue prn Hydrocodone/ApAp 5/325 q4h.

## 2013-09-18 NOTE — Assessment & Plan Note (Signed)
Managed with laxatives. Miralax daily prn, Senokot S II bid, Colace  bid. Continue Linzess.

## 2013-09-18 NOTE — Assessment & Plan Note (Signed)
Takes Levothyroxine 74mcg daily except Sunday 43mcg. Last TSH wnl while in hospital.

## 2013-09-18 NOTE — Progress Notes (Signed)
Patient ID: Jose Leach, male   DOB: 02/18/1932, 80 y.o.   MRN: [id number]    Code Status: Full Code  Allergies  Allergen Reactions  . Latex Rash  . Tape Rash    Paper tape is ok to use     Chief Complaint  Patient presents with  . Medical Management of Chronic Issues  . Acute Visit    buttock pain.     HPI: Patient is a 80 y.o. male seen in the SNF at [facility]  today for evaluation of worsened chronic buttock pain R hip hematoma and other chronic medical conditions.     ED 09/05/13-evaluate s/p fallen twice in a couple of days at his independent living apartment.  He was started on Septra for possibility of urinary tract infection returned to his independent living apartment. Patient had more falls and was admitted to the skilled nursing facility level of [facility] on 09/07/13.   While in the SNF level, he had another fall and was returned to the hospital on 09/09/13. A large right hip hematoma. He had generalized weakness. He was stabilized and returned to the skilled nursing facility at [facility] on 09/11/13.   Problem List Items Addressed This Visit   HYPOTHYROIDISM     Takes Levothyroxine 94mcg daily except Sunday 19mcg. Last TSH wnl while in hospital.        ANXIETY     Apparently stable on Zoloft 200mg , Wellbutrin 300mg , and Remeron 45mg . Clonopin 0.5mg  qhs and 0.25mg  bid prn.  F/u Dr. Neurology Jannifer Franklin 04/18/13       MIGRAINE HEADACHE     Controlled with Topmax.      Chronic constipation     Managed with laxatives. Miralax daily prn, Senokot S II bid, Colace  bid. Continue Linzess.        Hip hematoma, right     Right hematoma.  From fall.  Will check am Hgb to ensure no further bleeding.  Hgb stable at 13.     Other testicular hypofunction     Supplemented with Testosterone 50mg  topically qd.     Bilateral buttock pain - Primary     09/09/13 X-ray Pelvis IMPRESSION:  No acute abnormality. Moderately severe arthritis of the  right hip  with chondrocalcinosis  May consider  X-ray L spine and pelvis if no better The patient stated its chronic with sacral coccygeal area beefy redness and superficial skin missing spots-treated by the Dermatology-likes to resume "medicated cream" prescribed by the Dermatologist prior.  He stated his pain is located in his buttocks-doesn't travel down to his legs. Pain is not associated with truncal or BLE movements  Continue prn Hydrocodone/ApAp 5/325 q4h.         Review of Systems:  Review of Systems  Constitutional: Positive for malaise/fatigue. Negative for fever, chills, weight loss and diaphoresis.  HENT: Positive for hearing loss. Negative for congestion, ear discharge, ear pain, nosebleeds, sore throat and tinnitus.   Eyes: Negative for blurred vision, double vision, photophobia, pain, discharge and redness.  Respiratory: Negative for cough, hemoptysis, sputum production, shortness of breath, wheezing and stridor.   Cardiovascular: Positive for PND. Negative for chest pain, palpitations, orthopnea, claudication and leg swelling.  Gastrointestinal: Negative for heartburn, nausea, vomiting, abdominal pain, diarrhea, constipation, blood in stool and melena.  Genitourinary: Negative for dysuria, urgency, frequency, hematuria and flank pain.  Musculoskeletal: Positive for back pain, falls, joint pain and neck pain. Negative for myalgias.  Pain in buttocks.   Skin: Negative for itching and rash.       R hip hematoma-resolving with ecchymoses. Sacral coccygeal area beefy redness and superficial skin missing spots    Neurological: Positive for sensory change, speech change (low voice), weakness and headaches (treated). Negative for dizziness, tingling, tremors, focal weakness, seizures and loss of consciousness.  Endo/Heme/Allergies: Negative for environmental allergies and polydipsia. Does not bruise/bleed easily.  Psychiatric/Behavioral: Positive for depression and memory  loss. Negative for suicidal ideas, hallucinations and substance abuse. The patient is nervous/anxious. The patient does not have insomnia.      Past Medical History  Diagnosis Date  . Migraines     Dr Jannifer Franklin  . Peripheral neuropathy   . Benign essential tremor   . Cervical stenosis of spine   . Anemia     B12 deficiency  . Testosterone deficiency     Dr Lawerance Bach, [facility]  . Hypothyroidism   . Hyperlipidemia   . Depression     Dr Albertine Patricia  . History of shingles 2009  . H/O cardiovascular stress test     a. 02/2003 -> no ischemia/infarct.  Marland Kitchen Hx of echocardiogram     Echocardiogram 08/01/12: Mild LVH, EF 55-60%, normal wall motion.  Marland Kitchen PAD (peripheral artery disease)   . Carotid stenosis     s/p L CEA  . Fecal impaction 10/11/2012  . Macular edema   . Cervical spinal stenosis   . Hematoma of leg   . Acute urinary retention 01/28/2013  . AKI (acute kidney injury) 01/26/2013  . Altered mental status 09/09/2013  . ANXIETY 06/07/2007    Qualifier: Diagnosis of  By: Talbert Cage CMA ([place]), June    . ARTHRITIS 06/07/2007    Qualifier: Diagnosis of  By: Talbert Cage CMA ([place]), June    . Balance problems 04/05/2011  . Benign fibroma of prostate 10/11/2011  . Bilateral inguinal hernia 10/09/2012  . Bilateral leg edema 09/13/2012  . Bladder retention 02/05/2013  . Bradycardia, sinus 08/02/2012  . 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 CAROTID ARTERY DISEASE 03/31/2007    Qualifier: Diagnosis of  By: Linna Darner MD, Gwyndolyn Saxon    . Degeneration macular 11/02/2011  . Difficulty in walking(719.7) 04/05/2011  . ESOPHAGITIS 08/06/2002    Qualifier: Diagnosis of  By: Talbert Cage CMA ([place]), June    . Essential and other specified forms of tremor 04/18/2013  . Family history of colon cancer 07/24/2012  . Femoral hernia, bilateral s/p lap repair 01/16/2013 01/16/2013  . HAMMER TOE 04/23/2010    Qualifier: Diagnosis of  By: Linna Darner MD, Gwyndolyn Saxon    . Hip hematoma, right 09/09/2013  . HTN (hypertension) 08/22/2012  . Hydrocele  10/11/2011  . 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 MORTON'S NEUROMA, LEFT 08/22/2007    Qualifier: Diagnosis of  By: Linna Darner MD, Gwyndolyn Saxon    . Personal history of colonic polyps 01/05/2013    2014 2 mm adenomatous polyp   . PREMATURE ATRIAL CONTRACTIONS 03/14/2006    Qualifier: Diagnosis of  By: Linna Darner MD, Gwendolyn Lima 12/16/2011  . SPINAL STENOSIS 03/14/2006    Qualifier: Diagnosis of  By: Linna Darner MD, William   Cervical spine    . Unspecified vitamin D deficiency 08/06/2012  . Urge incontinence 10/09/2012  . Urinary tract infection, site not specified 02/13/2013  . Weakness 08/02/2012   Past Surgical History  Procedure Laterality Date  . Appendectomy  1941  . Lumbar laminectomy    . Carotid endarterectomy  2005     L  .  Tonsillectomy and adenoidectomy    . Total hip arthroplasty  2005    L  . Cataract extraction Right   . Eye surgery Right 02/2012    retina  . Inguinal hernia repair Bilateral 01/16/2013    Procedure: LAPAROSCOPIC BILATERAL INGUINAL DIRECT AND INDIRECT, FEMORAL, AND OBTURATOR HERNIAS;  Surgeon: Adin Hector, MD;  Location: [place];  Service: General;  Laterality: Bilateral;  . Insertion of mesh N/A 01/16/2013    Procedure: INSERTION OF MESH;  Surgeon: Adin Hector, MD;  Location: [place];  Service: General;  Laterality: N/A;  . Breast cyst excision Left 01/16/2013    Procedure: MASS EXCISION AXILLA;  Surgeon: Adin Hector, MD;  Location: [place];  Service: General;  Laterality: Left;   Social History:   reports that he quit smoking about 34 years ago. His smoking use included Pipe and Cigars. He has never used smokeless tobacco. He reports that he drinks about 4.2 ounces of alcohol per week. He reports that he does not use illicit drugs.  Family History  Problem Relation Age of Onset  . Stroke Mother 48  . Hypertension Mother   . Diabetes Mother   . Heart disease Mother   . Rectal cancer Father 58  . Heart disease Father     in 73s  . Cancer Father       colorectal  . Nephritis Sister   . Seizures Brother     died as child    Medications: Patient's Medications  New Prescriptions   No medications on file  Previous Medications   ACETAMINOPHEN (TYLENOL) 325 MG TABLET    Take 2 tablets (650 mg total) by mouth every 6 (six) hours as needed for mild pain (or Fever >/= 101).   ASPIRIN 81 MG TABLET    Take 81 mg by mouth every morning.    ATORVASTATIN (LIPITOR) 20 MG TABLET    TAKE 1 TABLET BY MOUTH EVERY DAY AS DIRECTED   B COMPLEX-C (B-COMPLEX WITH VITAMIN C) TABLET    Take 1 tablet by mouth every morning.   BIMATOPROST (LUMIGAN) 0.01 % SOLN    Place 1 drop into both eyes at bedtime.   BRIMONIDINE (ALPHAGAN) 0.15 % OPHTHALMIC SOLUTION    Place 1 drop into the right eye 2 (two) times daily.   BUPROPION (WELLBUTRIN XL) 300 MG 24 HR TABLET    Take 300 mg by mouth every morning.    CHOLECALCIFEROL (VITAMIN D) 1000 UNITS TABLET    Take 2,000 Units by mouth 2 (two) times daily.    CLONAZEPAM (KLONOPIN) 0.5 MG TABLET    Take 0.5 tablets (0.25 mg total) by mouth 2 (two) times daily as needed (anxiety, agitation).   CLONAZEPAM (KLONOPIN) 0.5 MG TABLET    Take 1 tablet (0.5 mg total) by mouth at bedtime.   DOCUSATE SODIUM 100 MG CAPS    Take 100 mg by mouth 2 (two) times daily.   FLAXSEED, LINSEED, (FLAX SEEDS PO)    Take 1 tablet by mouth every morning.    HYDROCODONE-ACETAMINOPHEN (NORCO/VICODIN) 5-325 MG PER TABLET    Take 1 tablet by mouth every 4 (four) hours as needed for moderate pain.   KRILL OIL 500 MG CAPS    Take 500 mg by mouth every morning.    L-METHYLFOLATE 7.5 MG TABS    Take 7.5 mg by mouth daily.   LACTOSE FREE NUTRITION (BOOST PLUS) LIQD    Take 237 mLs by mouth 3 (three) times daily with  meals.   LEVOTHYROXINE (SYNTHROID, LEVOTHROID) 50 MCG TABLET    Take 0.5-1 tablets (25-50 mcg total) by mouth every morning. Take 1/2 tab (36mcg) on Sundays, 1 tab (8mcg) on all other days.   LINACLOTIDE (LINZESS) 290 MCG CAPS CAPSULE    Take 290  mcg by mouth daily.   LINZESS 290 MCG CAPS CAPSULE    TAKE 1 CAPSULE (290 MCG TOTAL) BY MOUTH DAILY.   MIRTAZAPINE (REMERON) 45 MG TABLET    Take 45 mg by mouth at bedtime.   MULTIPLE VITAMINS-MINERALS (ICAPS AREDS FORMULA PO)    Take 1 capsule by mouth daily.    POLYETHYLENE GLYCOL (MIRALAX / GLYCOLAX) PACKET    Take 17 g by mouth daily as needed for mild constipation.   SENNOSIDES-DOCUSATE SODIUM (SENOKOT-S) 8.6-50 MG TABLET    Take 2 tablets by mouth 2 (two) times daily.    SERTRALINE (ZOLOFT) 100 MG TABLET    Take 200 mg by mouth every evening.    TESTOSTERONE ([name]) 50 MG/5GM GEL    Place 5 g onto the skin every morning.    TOPIRAMATE (TOPAMAX) 25 MG TABLET    Take 75 mg by mouth daily.   VITAMIN B-12 (CYANOCOBALAMIN) 1000 MCG TABLET    Take 1,000 mcg by mouth every morning.   Modified Medications   No medications on file  Discontinued Medications   No medications on file     Physical Exam: Physical Exam  Constitutional: He is oriented to person, place, and time. He appears well-developed and well-nourished. No distress.  HENT:  Head: Normocephalic and atraumatic.  Right Ear: External ear normal.  Left Ear: External ear normal.  Nose: Nose normal.  Mouth/Throat: Oropharynx is clear and moist. No oropharyngeal exudate.  Eyes: Conjunctivae and EOM are normal. Pupils are equal, round, and reactive to light. Right eye exhibits no discharge. Left eye exhibits no discharge. No scleral icterus.  Neck: Normal range of motion. Neck supple. No JVD present. No tracheal deviation present. No thyromegaly present.  Cardiovascular: Normal rate, regular rhythm, normal heart sounds and intact distal pulses.   No murmur heard. Pulmonary/Chest: Effort normal and breath sounds normal. No stridor. No respiratory distress. He has no wheezes. He has no rales. He exhibits no tenderness.  Abdominal: Soft. Bowel sounds are normal. He exhibits no distension. There is no tenderness. There is no rebound and  no guarding.  Genitourinary:  A small external hemorrhoid 5pm  Musculoskeletal: Normal range of motion. He exhibits tenderness. He exhibits no edema.  Pain in his buttocks-feels like hemorrhoids-there is a small external  Hemorrhoid 5pm w/o bleeding-relieves with Hydrocodone/ApAp. Sacral coccygeal area beefy redness and superficial skin missing spots   Lymphadenopathy:    He has no cervical adenopathy.  Neurological: He is alert and oriented to person, place, and time. He has normal reflexes. No cranial nerve deficit. He exhibits normal muscle tone. Coordination normal.  Skin: Skin is warm and dry. No rash noted. He is not diaphoretic. No erythema. No pallor.  R hip hematoma with diffused ecchymoses. Sacral coccygeal area beefy redness and superficial skin missing spots.    Psychiatric: His mood appears anxious. His affect is not angry, not blunt, not labile and not inappropriate. His speech is not rapid and/or pressured, not delayed, not tangential and not slurred. He is not agitated, not aggressive, not hyperactive, not slowed, not withdrawn, not actively hallucinating and not combative. Thought content is not paranoid and not delusional. Cognition and memory are impaired. He does not express  impulsivity or inappropriate judgment. He exhibits a depressed mood. He expresses no homicidal and no suicidal ideation. He expresses no suicidal plans and no homicidal plans. He is communicative. He exhibits abnormal recent memory. He exhibits normal remote memory. He is attentive.    Filed Vitals:   09/18/13 1237  BP: 148/84  Pulse: 56  Temp: 96.4 F (35.8 C)  TempSrc: Tympanic  Resp: 20      Labs reviewed: Basic Metabolic Panel:  Recent Labs  01/26/13 2210  09/07/13 1209 09/09/13 1807 09/10/13 0139  NA  --   < > 140 141 139  K  --   < > 4.2 4.7 4.8  CL  --   < > 105 107 107  CO2  --   < > 25 20 20   GLUCOSE  --   < > 85 79 85  BUN  --   < > 38* 38* 34*  CREATININE  --   < > 1.40*  1.29 1.17  CALCIUM  --   < > 9.0 8.5 8.3*  MG  --   --   --   --  1.7  PHOS  --   --   --   --  2.7  TSH 2.756  --   --   --  4.390  < > = values in this interval not displayed. Liver Function Tests:  Recent Labs  09/07/13 1209 09/09/13 1807 09/10/13 0139  AST 29 29 23   ALT 26 31 27   ALKPHOS 53 53 48  BILITOT 0.3 <0.2* 0.2*  PROT 6.4 6.1 5.9*  ALBUMIN 3.2* 3.0* 2.8*    Recent Labs  01/26/13 1900  LIPASE 15  CBC:  Recent Labs  01/26/13 1855 09/05/13 1206 09/07/13 1209 09/09/13 1807 09/10/13 0139  WBC 6.6 6.3 5.0 5.7 7.3  NEUTROABS 4.9 4.2 3.3  --   --   HGB 11.4* 15.0 14.2 13.8 13.2  HCT 35.2* 46.3 44.5 42.6 41.1  MCV 91.2 93.0 92.5 93.4 92.2  PLT 196 163 152 165 165   No results found for this basename: FOLATE, IRON, VITAMINB12,  in the last 8760 hours  Past Procedures:  08/02/12 2D echocardiogram:  LV EF: 55% -   60%  07/30/12 CT head  IMPRESSION: 1.  No acute intracranial abnormalities. 2.  Mild cerebral and cerebellar atrophy with chronic microvascular ischemic changes of the cerebral white matter, similar to the prior Study.  08/01/12 MRI brain:  IMPRESSION: Motion degraded exam.   No acute infarct.   Moderate small vessel disease type changes.   Global atrophy.   09/05/13 MRI head w/o CM:  IMPRESSION:  No acute infarction. Extensive and widespread chronic small-vessel  ischemic changes throughout the brain.   09/09/13 X-ray Pelvis  IMPRESSION:  No acute abnormality. Moderately severe arthritis of the right hip  with chondrocalcinosis.  09/09/13 CXR  IMPRESSION:  1. Mild elevation of the right hemidiaphragm is new compared to the  prior study, but is of uncertain the etiology and significance.  09/09/13 CT head w/o contrast:  IMPRESSION:  No acute intracranial abnormality. Chronic atrophy and chronic small  vessel ischemic disease with tiny lacunar infarcts, stable.  09/09/13 Echocardiogram LV EF:  65%  Assessment/Plan Bilateral buttock pain 09/09/13 X-ray Pelvis IMPRESSION:  No acute abnormality. Moderately severe arthritis of the right hip  with chondrocalcinosis  May consider  X-ray L spine and pelvis if no better The patient stated its chronic with sacral coccygeal area beefy redness and superficial skin missing  spots-treated by the Dermatology-likes to resume "medicated cream" prescribed by the Dermatologist prior.  He stated his pain is located in his buttocks-doesn't travel down to his legs. Pain is not associated with truncal or BLE movements  Continue prn Hydrocodone/ApAp 5/325 q4h.    HYPOTHYROIDISM Takes Levothyroxine 74mcg daily except Sunday 23mcg. Last TSH wnl while in hospital.      ANXIETY Apparently stable on Zoloft 200mg , Wellbutrin 300mg , and Remeron 45mg . Clonopin 0.5mg  qhs and 0.25mg  bid prn.  F/u Dr. Neurology Jannifer Franklin 04/18/13     MIGRAINE HEADACHE Controlled with Topmax.    Chronic constipation Managed with laxatives. Miralax daily prn, Senokot S II bid, Colace  bid. Continue Linzess.      Hip hematoma, right Right hematoma.  From fall.  Will check am Hgb to ensure no further bleeding.  Hgb stable at 13.   Other testicular hypofunction Supplemented with Testosterone 50mg  topically qd.     Family/ Staff Communication: observe the patient.   Goals of Care: [place]  Labs/tests ordered: none

## 2013-09-18 NOTE — Assessment & Plan Note (Signed)
Apparently stable on Zoloft 200mg , Wellbutrin 300mg , and Remeron 45mg . Clonopin 0.5mg  qhs and 0.25mg  bid prn.  F/u Dr. Neurology Jannifer Franklin 04/18/13

## 2013-09-19 ENCOUNTER — Encounter: Payer: Self-pay | Admitting: *Deleted

## 2013-09-25 ENCOUNTER — Non-Acute Institutional Stay (SKILLED_NURSING_FACILITY): Payer: Medicare Other | Admitting: Nurse Practitioner

## 2013-09-25 ENCOUNTER — Encounter: Payer: Self-pay | Admitting: Nurse Practitioner

## 2013-09-25 DIAGNOSIS — K5909 Other constipation: Secondary | ICD-10-CM

## 2013-09-25 DIAGNOSIS — G43909 Migraine, unspecified, not intractable, without status migrainosus: Secondary | ICD-10-CM

## 2013-09-25 DIAGNOSIS — S7001XS Contusion of right hip, sequela: Secondary | ICD-10-CM

## 2013-09-25 DIAGNOSIS — M25569 Pain in unspecified knee: Secondary | ICD-10-CM

## 2013-09-25 DIAGNOSIS — F411 Generalized anxiety disorder: Secondary | ICD-10-CM

## 2013-09-25 DIAGNOSIS — M25561 Pain in right knee: Secondary | ICD-10-CM

## 2013-09-25 DIAGNOSIS — IMO0002 Reserved for concepts with insufficient information to code with codable children: Secondary | ICD-10-CM

## 2013-09-25 DIAGNOSIS — E291 Testicular hypofunction: Secondary | ICD-10-CM

## 2013-09-25 DIAGNOSIS — E039 Hypothyroidism, unspecified: Secondary | ICD-10-CM

## 2013-09-25 DIAGNOSIS — K5900 Constipation, unspecified: Secondary | ICD-10-CM

## 2013-09-25 NOTE — Assessment & Plan Note (Signed)
Controlled with Topmax.

## 2013-09-25 NOTE — Assessment & Plan Note (Signed)
Managed with laxatives. Miralax daily prn, change Senokot S II bid to prn due to loose stools, continue Colace  bid. Continue Linzess.

## 2013-09-25 NOTE — Assessment & Plan Note (Signed)
No pain, resolved to a pingpong ball size.

## 2013-09-25 NOTE — Assessment & Plan Note (Signed)
Apparently stable on Zoloft 200mg , Wellbutrin 300mg , and Remeron 45mg . Clonopin 0.5mg  qhs and 0.25mg  bid prn.  F/u Dr. Neurology Jannifer Franklin

## 2013-09-25 NOTE — Assessment & Plan Note (Signed)
Supplemented with Testosterone 50mg  topically qd.

## 2013-09-25 NOTE — Progress Notes (Signed)
Patient ID: Jose Leach, male   DOB: March 03, 1932, 80 y.o.   MRN: [id number]    Code Status: Full Code  Allergies  Allergen Reactions  . Latex Rash  . Tape Rash    Paper tape is ok to use     Chief Complaint  Patient presents with  . Medical Management of Chronic Issues  . Acute Visit    constipation, rgith leg pain/swelling, R hip hematoma.     HPI: Patient is a 80 y.o. male seen in the SNF at [facility]  today for evaluation of R leg pain/swelling/bruise lower 1/3 of thigh and knee and upper lower leg, R hip hematoma,  and other chronic medical conditions.     ED 09/05/13-evaluate s/p fallen twice in a couple of days at his independent living apartment.  He was started on Septra for possibility of urinary tract infection returned to his independent living apartment. Patient had more falls and was admitted to the skilled nursing facility level of [facility] on 09/07/13.   While in the SNF level, he had another fall and was returned to the hospital on 09/09/13. A large right hip hematoma. He had generalized weakness. He was stabilized and returned to the skilled nursing facility at [facility] on 09/11/13.   Problem List Items Addressed This Visit   HYPOTHYROIDISM     Takes Levothyroxine 36mcg daily except Sunday 32mcg. Last TSH wnl while in hospital.      ANXIETY     Apparently stable on Zoloft 200mg , Wellbutrin 300mg , and Remeron 45mg . Clonopin 0.5mg  qhs and 0.25mg  bid prn.  F/u Dr. Neurology Jannifer Franklin     MIGRAINE HEADACHE     Controlled with Topmax.     Chronic constipation - Primary     Managed with laxatives. Miralax daily prn, change Senokot S II bid to prn due to loose stools, continue Colace  bid. Continue Linzess.      Hip hematoma, right     No pain, resolved to a pingpong ball size.     Other testicular hypofunction     Supplemented with Testosterone 50mg  topically qd.      Knee pain, right     Posterior the 1/3 right lower thigh, knee, and  1/3 right upper lower leg-swelling, pain, bruise, mild warmth noted. Able to bear weight and no reduced ROM. Will obtain X-ray and venous Doppler to evaluate further. Apply MyoFlex cream to affected area qid.        Review of Systems:  Review of Systems  Constitutional: Negative for fever, chills, weight loss, malaise/fatigue and diaphoresis.  HENT: Positive for hearing loss. Negative for congestion, ear discharge, ear pain, nosebleeds, sore throat and tinnitus.   Eyes: Negative for blurred vision, double vision, photophobia, pain, discharge and redness.  Respiratory: Negative for cough, hemoptysis, sputum production, shortness of breath, wheezing and stridor.   Cardiovascular: Positive for PND. Negative for chest pain, palpitations, orthopnea, claudication and leg swelling.  Gastrointestinal: Negative for heartburn, nausea, vomiting, abdominal pain, diarrhea, constipation, blood in stool and melena.  Genitourinary: Negative for dysuria, urgency, frequency, hematuria and flank pain.  Musculoskeletal: Positive for back pain, falls, joint pain and neck pain. Negative for myalgias.       Pain in buttocks.   Skin: Negative for itching and rash.       R hip hematoma-smaller-about a ping pong ball size now. Sacral coccygeal area beefy redness and superficial skin missing spots-improving. Swelling, bruise, mild heat, and pain in  the 1/3 lower R thigh, knee, and 1/3 upper R lower leg.     Neurological: Positive for sensory change, speech change (low voice), weakness and headaches (treated). Negative for dizziness, tingling, tremors, focal weakness, seizures and loss of consciousness.  Endo/Heme/Allergies: Negative for environmental allergies and polydipsia. Does not bruise/bleed easily.  Psychiatric/Behavioral: Positive for depression and memory loss. Negative for suicidal ideas, hallucinations and substance abuse. The patient is nervous/anxious. The patient does not have insomnia.      Past  Medical History  Diagnosis Date  . Migraines     Dr Jannifer Franklin  . Peripheral neuropathy   . Benign essential tremor   . Cervical stenosis of spine   . Anemia     B12 deficiency  . Testosterone deficiency     Dr Lawerance Bach, [facility]  . Hypothyroidism   . Hyperlipidemia   . Depression     Dr Albertine Patricia  . History of shingles 2009  . H/O cardiovascular stress test     a. 02/2003 -> no ischemia/infarct.  Marland Kitchen Hx of echocardiogram     Echocardiogram 08/01/12: Mild LVH, EF 55-60%, normal wall motion.  Marland Kitchen PAD (peripheral artery disease)   . Carotid stenosis     s/p L CEA  . Fecal impaction 10/11/2012  . Macular edema   . Cervical spinal stenosis   . Hematoma of leg   . Acute urinary retention 01/28/2013  . AKI (acute kidney injury) 01/26/2013  . Altered mental status 09/09/2013  . ANXIETY 06/07/2007    Qualifier: Diagnosis of  By: Talbert Cage CMA ([place]), June    . ARTHRITIS 06/07/2007    Qualifier: Diagnosis of  By: Talbert Cage CMA ([place]), June    . Balance problems 04/05/2011  . Benign fibroma of prostate 10/11/2011  . Bilateral inguinal hernia 10/09/2012  . Bilateral leg edema 09/13/2012  . Bladder retention 02/05/2013  . Bradycardia, sinus 08/02/2012  . 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 CAROTID ARTERY DISEASE 03/31/2007    Qualifier: Diagnosis of  By: Linna Darner MD, Gwyndolyn Saxon    . Degeneration macular 11/02/2011  . Difficulty in walking(719.7) 04/05/2011  . ESOPHAGITIS 08/06/2002    Qualifier: Diagnosis of  By: Talbert Cage CMA ([place]), June    . Essential and other specified forms of tremor 04/18/2013  . Family history of colon cancer 07/24/2012  . Femoral hernia, bilateral s/p lap repair 01/16/2013 01/16/2013  . HAMMER TOE 04/23/2010    Qualifier: Diagnosis of  By: Linna Darner MD, Gwyndolyn Saxon    . Hip hematoma, right 09/09/2013  . HTN (hypertension) 08/22/2012  . Hydrocele 10/11/2011  . 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 MORTON'S NEUROMA, LEFT 08/22/2007    Qualifier: Diagnosis of  By: Linna Darner MD, Gwyndolyn Saxon    . Personal history  of colonic polyps 01/05/2013    2014 2 mm adenomatous polyp   . PREMATURE ATRIAL CONTRACTIONS 03/14/2006    Qualifier: Diagnosis of  By: Linna Darner MD, Gwendolyn Lima 12/16/2011  . SPINAL STENOSIS 03/14/2006    Qualifier: Diagnosis of  By: Linna Darner MD, William   Cervical spine    . Unspecified vitamin D deficiency 08/06/2012  . Urge incontinence 10/09/2012  . Urinary tract infection, site not specified 02/13/2013  . Weakness 08/02/2012   Past Surgical History  Procedure Laterality Date  . Appendectomy  1941  . Lumbar laminectomy    . Carotid endarterectomy  2005     L  . Tonsillectomy and adenoidectomy    . Total hip arthroplasty  2005    L  .  Cataract extraction Right   . Eye surgery Right 02/2012    retina  . Inguinal hernia repair Bilateral 01/16/2013    Procedure: LAPAROSCOPIC BILATERAL INGUINAL DIRECT AND INDIRECT, FEMORAL, AND OBTURATOR HERNIAS;  Surgeon: Adin Hector, MD;  Location: [place];  Service: General;  Laterality: Bilateral;  . Insertion of mesh N/A 01/16/2013    Procedure: INSERTION OF MESH;  Surgeon: Adin Hector, MD;  Location: [place];  Service: General;  Laterality: N/A;  . Breast cyst excision Left 01/16/2013    Procedure: MASS EXCISION AXILLA;  Surgeon: Adin Hector, MD;  Location: [place];  Service: General;  Laterality: Left;   Social History:   reports that he quit smoking about 34 years ago. His smoking use included Pipe and Cigars. He has never used smokeless tobacco. He reports that he drinks about 4.2 ounces of alcohol per week. He reports that he does not use illicit drugs.  Family History  Problem Relation Age of Onset  . Stroke Mother 63  . Hypertension Mother   . Diabetes Mother   . Heart disease Mother   . Rectal cancer Father 85  . Heart disease Father     in 22s  . Cancer Father     colorectal  . Nephritis Sister   . Seizures Brother     died as child    Medications: Patient's Medications  New Prescriptions   No medications on  file  Previous Medications   ACETAMINOPHEN (TYLENOL) 325 MG TABLET    Take 2 tablets (650 mg total) by mouth every 6 (six) hours as needed for mild pain (or Fever >/= 101).   ASPIRIN 81 MG TABLET    Take 81 mg by mouth every morning.    ATORVASTATIN (LIPITOR) 20 MG TABLET    Take one tablet by mouth once daily in the evening for cholesterol   B COMPLEX-C (B-COMPLEX WITH VITAMIN C) TABLET    Take 1 tablet by mouth every morning.   BIMATOPROST (LUMIGAN) 0.01 % SOLN    Place 1 drop into both eyes at bedtime.   BRIMONIDINE (ALPHAGAN) 0.2 % OPHTHALMIC SOLUTION    Place one drop into the right eye two times daily   BUPROPION (WELLBUTRIN XL) 300 MG 24 HR TABLET    Take 300 mg by mouth every morning.    CHOLECALCIFEROL (VITAMIN D) 1000 UNITS TABLET    Take 2,000 Units by mouth 2 (two) times daily.    CLONAZEPAM (KLONOPIN) 0.5 MG TABLET    Take 0.5 tablets (0.25 mg total) by mouth 2 (two) times daily as needed (anxiety, agitation).   CLONAZEPAM (KLONOPIN) 0.5 MG TABLET    Take 1 tablet (0.5 mg total) by mouth at bedtime.   DOCUSATE SODIUM 100 MG CAPS    Take 100 mg by mouth 2 (two) times daily.   FLAXSEED, LINSEED, (FLAX SEEDS PO)    Take 1 tablet by mouth every morning.    HYDROCODONE-ACETAMINOPHEN (NORCO/VICODIN) 5-325 MG PER TABLET    Take 1 tablet by mouth every 4 (four) hours as needed for moderate pain.   KRILL OIL 500 MG CAPS    Take 500 mg by mouth every morning.    L-METHYLFOLATE 7.5 MG TABS    Take 7.5 mg by mouth daily.   LACTOSE FREE NUTRITION (BOOST PLUS) LIQD    Take 237 mLs by mouth 3 (three) times daily with meals.   LEVOTHYROXINE (SYNTHROID, LEVOTHROID) 50 MCG TABLET    Take one tablet by mouth  30 minutes before breakfast on empty stomach once daily except on Sunday take 1/2 tablet for thyroid   LINACLOTIDE (LINZESS) 290 MCG CAPS CAPSULE    Take 290 mcg by mouth daily.   MIRTAZAPINE (REMERON) 45 MG TABLET    Take 45 mg by mouth at bedtime.   MULTIPLE VITAMINS-MINERALS (ICAPS AREDS  FORMULA PO)    Take 1 capsule by mouth daily.    POLYETHYLENE GLYCOL (MIRALAX / GLYCOLAX) PACKET    Take 17 g by mouth daily as needed for mild constipation.   SENNOSIDES-DOCUSATE SODIUM (SENOKOT-S) 8.6-50 MG TABLET    Take 2 tablets by mouth 2 (two) times daily as needed.    SERTRALINE (ZOLOFT) 100 MG TABLET    Take two tablets by mouth once daily in the evening   TESTOSTERONE ([name]) 50 MG/5GM GEL    Place 5 g onto the skin every morning.    TOPIRAMATE (TOPAMAX) 25 MG TABLET    Take 75 mg by mouth daily.   VITAMIN B-12 (CYANOCOBALAMIN) 1000 MCG TABLET    Take 1,000 mcg by mouth every morning.   Modified Medications   No medications on file  Discontinued Medications   No medications on file     Physical Exam: Physical Exam  Constitutional: He is oriented to person, place, and time. He appears well-developed and well-nourished. No distress.  HENT:  Head: Normocephalic and atraumatic.  Right Ear: External ear normal.  Left Ear: External ear normal.  Nose: Nose normal.  Mouth/Throat: Oropharynx is clear and moist. No oropharyngeal exudate.  Eyes: Conjunctivae and EOM are normal. Pupils are equal, round, and reactive to light. Right eye exhibits no discharge. Left eye exhibits no discharge. No scleral icterus.  Neck: Normal range of motion. Neck supple. No JVD present. No tracheal deviation present. No thyromegaly present.  Cardiovascular: Normal rate, regular rhythm, normal heart sounds and intact distal pulses.   No murmur heard. Pulmonary/Chest: Effort normal and breath sounds normal. No stridor. No respiratory distress. He has no wheezes. He has no rales. He exhibits no tenderness.  Abdominal: Soft. Bowel sounds are normal. He exhibits no distension. There is no tenderness. There is no rebound and no guarding.  Genitourinary:  A small external hemorrhoid 5pm  Musculoskeletal: Normal range of motion. He exhibits tenderness. He exhibits no edema.  Pain in his buttocks-feels like  hemorrhoids-there is a small external  Hemorrhoid 5pm w/o bleeding-relieves with Hydrocodone/ApAp. Sacral coccygeal area beefy redness and superficial skin missing spots   Lymphadenopathy:    He has no cervical adenopathy.  Neurological: He is alert and oriented to person, place, and time. He has normal reflexes. No cranial nerve deficit. He exhibits normal muscle tone. Coordination normal.  Skin: Skin is warm and dry. No rash noted. He is not diaphoretic. No erythema. No pallor.  R hip hematoma-smaller-about a ping pong ball size now. Sacral coccygeal area beefy redness and superficial skin missing spots-improving. Swelling, bruise, mild heat, and pain in the 1/3 lower R thigh, knee, and 1/3 upper R lower leg.      Psychiatric: His mood appears anxious. His affect is not angry, not blunt, not labile and not inappropriate. His speech is not rapid and/or pressured, not delayed, not tangential and not slurred. He is not agitated, not aggressive, not hyperactive, not slowed, not withdrawn, not actively hallucinating and not combative. Thought content is not paranoid and not delusional. Cognition and memory are impaired. He does not express impulsivity or inappropriate judgment. He exhibits a depressed mood.  He expresses no homicidal and no suicidal ideation. He expresses no suicidal plans and no homicidal plans. He is communicative. He exhibits abnormal recent memory. He exhibits normal remote memory. He is attentive.    Filed Vitals:   09/25/13 1334  BP: 114/56  Pulse: 52  Temp: 97.1 F (36.2 C)  TempSrc: Tympanic  Resp: 16      Labs reviewed: Basic Metabolic Panel:  Recent Labs  01/26/13 2210  09/07/13 1209 09/09/13 1807 09/10/13 0139  NA  --   < > 140 141 139  K  --   < > 4.2 4.7 4.8  CL  --   < > 105 107 107  CO2  --   < > 25 20 20   GLUCOSE  --   < > 85 79 85  BUN  --   < > 38* 38* 34*  CREATININE  --   < > 1.40* 1.29 1.17  CALCIUM  --   < > 9.0 8.5 8.3*  MG  --   --   --    --  1.7  PHOS  --   --   --   --  2.7  TSH 2.756  --   --   --  4.390  < > = values in this interval not displayed. Liver Function Tests:  Recent Labs  09/07/13 1209 09/09/13 1807 09/10/13 0139  AST 29 29 23   ALT 26 31 27   ALKPHOS 53 53 48  BILITOT 0.3 <0.2* 0.2*  PROT 6.4 6.1 5.9*  ALBUMIN 3.2* 3.0* 2.8*    Recent Labs  01/26/13 1900  LIPASE 15  CBC:  Recent Labs  01/26/13 1855 09/05/13 1206 09/07/13 1209 09/09/13 1807 09/10/13 0139  WBC 6.6 6.3 5.0 5.7 7.3  NEUTROABS 4.9 4.2 3.3  --   --   HGB 11.4* 15.0 14.2 13.8 13.2  HCT 35.2* 46.3 44.5 42.6 41.1  MCV 91.2 93.0 92.5 93.4 92.2  PLT 196 163 152 165 165   No results found for this basename: FOLATE, IRON, VITAMINB12,  in the last 8760 hours  Past Procedures:  08/02/12 2D echocardiogram:  LV EF: 55% -   60%  07/30/12 CT head  IMPRESSION: 1.  No acute intracranial abnormalities. 2.  Mild cerebral and cerebellar atrophy with chronic microvascular ischemic changes of the cerebral white matter, similar to the prior Study.  08/01/12 MRI brain:  IMPRESSION: Motion degraded exam.   No acute infarct.   Moderate small vessel disease type changes.   Global atrophy.   09/05/13 MRI head w/o CM:  IMPRESSION:  No acute infarction. Extensive and widespread chronic small-vessel  ischemic changes throughout the brain.   09/09/13 X-ray Pelvis  IMPRESSION:  No acute abnormality. Moderately severe arthritis of the right hip  with chondrocalcinosis.  09/09/13 CXR  IMPRESSION:  1. Mild elevation of the right hemidiaphragm is new compared to the  prior study, but is of uncertain the etiology and significance.  09/09/13 CT head w/o contrast:  IMPRESSION:  No acute intracranial abnormality. Chronic atrophy and chronic small  vessel ischemic disease with tiny lacunar infarcts, stable.  09/09/13 Echocardiogram LV EF: 65%  Assessment/Plan Chronic constipation Managed with laxatives. Miralax daily prn, change  Senokot S II bid to prn due to loose stools, continue Colace  bid. Continue Linzess.    Hip hematoma, right No pain, resolved to a pingpong ball size.   Knee pain, right Posterior the 1/3 right lower thigh, knee, and 1/3 right upper lower leg-swelling,  pain, bruise, mild warmth noted. Able to bear weight and no reduced ROM. Will obtain X-ray and venous Doppler to evaluate further. Apply MyoFlex cream to affected area qid.   HYPOTHYROIDISM Takes Levothyroxine 59mcg daily except Sunday 19mcg. Last TSH wnl while in hospital.    ANXIETY Apparently stable on Zoloft 200mg , Wellbutrin 300mg , and Remeron 45mg . Clonopin 0.5mg  qhs and 0.25mg  bid prn.  F/u Dr. Neurology Jannifer Franklin   Other testicular hypofunction Supplemented with Testosterone 50mg  topically qd.    MIGRAINE HEADACHE Controlled with Topmax.     Family/ Staff Communication: observe the patient.   Goals of Care: IL  Labs/tests ordered: X-ray R knee, R lower thigh, R upper lower leg, Venous Doppler RLE.

## 2013-09-25 NOTE — Assessment & Plan Note (Signed)
Posterior the 1/3 right lower thigh, knee, and 1/3 right upper lower leg-swelling, pain, bruise, mild warmth noted. Able to bear weight and no reduced ROM. Will obtain X-ray and venous Doppler to evaluate further. Apply MyoFlex cream to affected area qid.

## 2013-09-25 NOTE — Assessment & Plan Note (Signed)
Takes Levothyroxine 31mcg daily except Sunday 10mcg. Last TSH wnl while in hospital.

## 2013-09-26 NOTE — Progress Notes (Signed)
Patient ID: Jose Leach, male   DOB: 1932/07/19, 80 y.o.   MRN: [id number]    Location:  [place] of Service: SNF (31)  Extended Emergency Contact Information Primary Emergency Contact: Porath,[name] Address: [address]          [place], [place] [address] [place] Phone: [phone] Mobile Phone: [phone] Relation: Spouse   Chief Complaint  Patient presents with  . Hospitalization Follow-up    New admit to SNF    HPI:  Hospitalized 01/26/13 to 01/29/13. Had AKI, acute urinary retention, fecal impaction and difficulty walking.   He had inguinal hernia repair 01/16/13.  Other issues include hypothyroid, HLD, Migraine headaches, and HTN.  He is here for STR to regain strength and safe ambulation  Past Medical History  Diagnosis Date  . Migraines     Dr Jannifer Franklin  . Peripheral neuropathy   . Benign essential tremor   . Cervical stenosis of spine   . Anemia     B12 deficiency  . Testosterone deficiency     Dr Lawerance Bach, [facility]  . Hypothyroidism   . Hyperlipidemia   . Depression     Dr Albertine Patricia  . History of shingles 2009  . H/O cardiovascular stress test     a. 02/2003 -> no ischemia/infarct.  Marland Kitchen Hx of echocardiogram     Echocardiogram 08/01/12: Mild LVH, EF 55-60%, normal wall motion.  Marland Kitchen PAD (peripheral artery disease)   . Carotid stenosis     s/p L CEA  . Fecal impaction 10/11/2012  . Macular edema   . Cervical spinal stenosis   . Hematoma of leg   . Acute urinary retention 01/28/2013  . AKI (acute kidney injury) 01/26/2013  . Altered mental status 09/09/2013  . ANXIETY 06/07/2007    Qualifier: Diagnosis of  By: Talbert Cage CMA ([place]), June    . ARTHRITIS 06/07/2007    Qualifier: Diagnosis of  By: Talbert Cage CMA ([place]), June    . Balance problems 04/05/2011  . Benign fibroma of prostate 10/11/2011  . Bilateral inguinal hernia 10/09/2012  . Bilateral leg edema 09/13/2012  . Bladder retention 02/05/2013  . Bradycardia, sinus  08/02/2012  . 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 CAROTID ARTERY DISEASE 03/31/2007    Qualifier: Diagnosis of  By: Linna Darner MD, Gwyndolyn Saxon    . Degeneration macular 11/02/2011  . Difficulty in walking(719.7) 04/05/2011  . ESOPHAGITIS 08/06/2002    Qualifier: Diagnosis of  By: Talbert Cage CMA ([place]), June    . Essential and other specified forms of tremor 04/18/2013  . Family history of colon cancer 07/24/2012  . Femoral hernia, bilateral s/p lap repair 01/16/2013 01/16/2013  . HAMMER TOE 04/23/2010    Qualifier: Diagnosis of  By: Linna Darner MD, Gwyndolyn Saxon    . Hip hematoma, right 09/09/2013  . HTN (hypertension) 08/22/2012  . Hydrocele 10/11/2011  . 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 MORTON'S NEUROMA, LEFT 08/22/2007    Qualifier: Diagnosis of  By: Linna Darner MD, Gwyndolyn Saxon    . Personal history of colonic polyps 01/05/2013    2014 2 mm adenomatous polyp   . PREMATURE ATRIAL CONTRACTIONS 03/14/2006    Qualifier: Diagnosis of  By: Linna Darner MD, Gwendolyn Lima 12/16/2011  . SPINAL STENOSIS 03/14/2006    Qualifier: Diagnosis of  By: Linna Darner MD, William   Cervical spine    . Unspecified vitamin D deficiency 08/06/2012  . Urge incontinence 10/09/2012  . Urinary tract infection, site not specified 02/13/2013  .  Weakness 08/02/2012    Past Surgical History  Procedure Laterality Date  . Appendectomy  1941  . Lumbar laminectomy    . Carotid endarterectomy  2005     L  . Tonsillectomy and adenoidectomy    . Total hip arthroplasty  2005    L  . Cataract extraction Right   . Eye surgery Right 02/2012    retina  . Inguinal hernia repair Bilateral 01/16/2013    Procedure: LAPAROSCOPIC BILATERAL INGUINAL DIRECT AND INDIRECT, FEMORAL, AND OBTURATOR HERNIAS;  Surgeon: Adin Hector, MD;  Location: [place];  Service: General;  Laterality: Bilateral;  . Insertion of mesh N/A 01/16/2013    Procedure: INSERTION OF MESH;  Surgeon: Adin Hector, MD;  Location: [place];  Service: General;  Laterality: N/A;  . Breast cyst  excision Left 01/16/2013    Procedure: MASS EXCISION AXILLA;  Surgeon: Adin Hector, MD;  Location: [place];  Service: General;  Laterality: Left;    History   Social History  . Marital Status: Married    Spouse Name: [name]    Number of Children: 2  . Years of Education: N/A   Occupational History  . retired    Social History Main Topics  . Smoking status: Former Smoker -- 10 years    Types: Pipe, Cigars    Quit date: 02/16/1979  . Smokeless tobacco: Never Used     Comment: Quit about age 45   . Alcohol Use: 4.2 oz/week    7 Glasses of wine per week     Comment:  occasional  . Drug Use: No  . Sexual Activity: No   Other Topics Concern  . Not on file   Social History Narrative  . No narrative on file     reports that he quit smoking about 34 years ago. His smoking use included Pipe and Cigars. He has never used smokeless tobacco. He reports that he drinks about 4.2 ounces of alcohol per week. He reports that he does not use illicit drugs.  Immunization History  Administered Date(s) Administered  . Influenza Split 11/12/2011  . Influenza Whole 10/30/2007, 11/12/2008, 11/26/2009  . PPD Test 06/23/2011  . Zoster 08/30/2006    Allergies  Allergen Reactions  . Latex Rash  . Tape Rash    Paper tape is ok to use     Medications: Patient's Medications  New Prescriptions   No medications on file  Previous Medications   ACETAMINOPHEN (TYLENOL) 325 MG TABLET    Take 2 tablets (650 mg total) by mouth every 6 (six) hours as needed for mild pain (or Fever >/= 101).   ASPIRIN 81 MG TABLET    Take 81 mg by mouth every morning.    ATORVASTATIN (LIPITOR) 20 MG TABLET    Take one tablet by mouth once daily in the evening for cholesterol   B COMPLEX-C (B-COMPLEX WITH VITAMIN C) TABLET    Take 1 tablet by mouth every morning.   BIMATOPROST (LUMIGAN) 0.01 % SOLN    Place 1 drop into both eyes at bedtime.   BRIMONIDINE (ALPHAGAN) 0.2 % OPHTHALMIC SOLUTION    Place one drop into  the right eye two times daily   BUPROPION (WELLBUTRIN XL) 300 MG 24 HR TABLET    Take 300 mg by mouth every morning.    CHOLECALCIFEROL (VITAMIN D) 1000 UNITS TABLET    Take 2,000 Units by mouth 2 (two) times daily.    CLONAZEPAM (KLONOPIN) 0.5 MG TABLET  Take 0.5 tablets (0.25 mg total) by mouth 2 (two) times daily as needed (anxiety, agitation).   CLONAZEPAM (KLONOPIN) 0.5 MG TABLET    Take 1 tablet (0.5 mg total) by mouth at bedtime.   DOCUSATE SODIUM 100 MG CAPS    Take 100 mg by mouth 2 (two) times daily.   FLAXSEED, LINSEED, (FLAX SEEDS PO)    Take 1 tablet by mouth every morning.    HYDROCODONE-ACETAMINOPHEN (NORCO/VICODIN) 5-325 MG PER TABLET    Take 1 tablet by mouth every 4 (four) hours as needed for moderate pain.   KRILL OIL 500 MG CAPS    Take 500 mg by mouth every morning.    L-METHYLFOLATE 7.5 MG TABS    Take 7.5 mg by mouth daily.   LACTOSE FREE NUTRITION (BOOST PLUS) LIQD    Take 237 mLs by mouth 3 (three) times daily with meals.   LEVOTHYROXINE (SYNTHROID, LEVOTHROID) 50 MCG TABLET    Take one tablet by mouth 30 minutes before breakfast on empty stomach once daily except on Sunday take 1/2 tablet for thyroid   LINACLOTIDE (LINZESS) 290 MCG CAPS CAPSULE    Take 290 mcg by mouth daily.   MIRTAZAPINE (REMERON) 45 MG TABLET    Take 45 mg by mouth at bedtime.   MULTIPLE VITAMINS-MINERALS (ICAPS AREDS FORMULA PO)    Take 1 capsule by mouth daily.    POLYETHYLENE GLYCOL (MIRALAX / GLYCOLAX) PACKET    Take 17 g by mouth daily as needed for mild constipation.   SENNOSIDES-DOCUSATE SODIUM (SENOKOT-S) 8.6-50 MG TABLET    Take 2 tablets by mouth 2 (two) times daily as needed.    SERTRALINE (ZOLOFT) 100 MG TABLET    Take two tablets by mouth once daily in the evening   TESTOSTERONE ([name]) 50 MG/5GM GEL    Place 5 g onto the skin every morning.    TOPIRAMATE (TOPAMAX) 25 MG TABLET    Take 75 mg by mouth daily.   VITAMIN B-12 (CYANOCOBALAMIN) 1000 MCG TABLET    Take 1,000 mcg by mouth  every morning.   Modified Medications   No medications on file  Discontinued Medications   No medications on file     Review of Systems  Constitutional: Positive for fatigue.       Malaise  HENT: Positive for hearing loss. Negative for sore throat, tinnitus, trouble swallowing and voice change.   Eyes: Positive for visual disturbance (corrective lenses). Negative for photophobia, pain and itching.  Respiratory: Negative for cough, choking, chest tightness and shortness of breath.   Cardiovascular: Negative for chest pain, palpitations and leg swelling.  Gastrointestinal: Positive for constipation. Negative for nausea, abdominal pain, diarrhea, blood in stool, abdominal distention and rectal pain.  Endocrine: Negative.   Genitourinary: Positive for difficulty urinating.       Recent acute urinary retention. Foley in place.  Musculoskeletal: Positive for arthralgias, back pain and gait problem.       HX falls  Skin: Positive for wound (inguinal hernia repair wound is healing). Negative for pallor and rash.  Allergic/Immunologic: Negative.   Neurological: Positive for speech difficulty, weakness and headaches. Negative for dizziness, tremors, facial asymmetry and numbness.  Hematological: Negative.   Psychiatric/Behavioral: Negative for hallucinations, behavioral problems, sleep disturbance, self-injury and agitation. The patient is nervous/anxious. The patient is not hyperactive.     Filed Vitals:   02/06/13 0928  BP: 140/70  Pulse: 78  Temp: 98.6 F (37 C)  Resp: 16  Height: $Remove'6\' 1"'FKPABNZ$  (  1.854 m)  Weight: 150 lb (68.04 kg)   Body mass index is 19.79 kg/(m^2).  Physical Exam  Constitutional: He is oriented to person, place, and time. He appears well-developed and well-nourished. No distress.  HENT:  Head: Normocephalic and atraumatic.  Right Ear: External ear normal.  Left Ear: External ear normal.  Nose: Nose normal.  Mouth/Throat: Oropharynx is clear and moist. No  oropharyngeal exudate.  Eyes: Conjunctivae and EOM are normal. Pupils are equal, round, and reactive to light. Right eye exhibits no discharge. Left eye exhibits no discharge. No scleral icterus.  Neck: Normal range of motion. Neck supple. No JVD present. No tracheal deviation present. No thyromegaly present.  Cardiovascular: Normal rate, regular rhythm, normal heart sounds and intact distal pulses.   No murmur heard. Pulmonary/Chest: Effort normal and breath sounds normal. No stridor. No respiratory distress. He has no wheezes. He has no rales. He exhibits no tenderness.  Abdominal: Soft. Bowel sounds are normal. He exhibits no distension. There is no tenderness. There is no rebound and no guarding.  Genitourinary:  Foley  Musculoskeletal: Normal range of motion. He exhibits no edema and no tenderness.  Lymphadenopathy:    He has no cervical adenopathy.  Neurological: He is alert and oriented to person, place, and time. He has normal reflexes. No cranial nerve deficit. He exhibits normal muscle tone. Coordination normal.  Skin: Skin is warm and dry. No rash noted. He is not diaphoretic. No erythema. No pallor.  abd inguinal hernia repair laparotomy scars.   Psychiatric: His mood appears anxious. His affect is not angry, not blunt, not labile and not inappropriate. His speech is not rapid and/or pressured, not delayed, not tangential and not slurred. He is not agitated, not aggressive, not hyperactive, not slowed, not withdrawn, not actively hallucinating and not combative. Thought content is not paranoid and not delusional. Cognition and memory are impaired. He does not express impulsivity or inappropriate judgment. He exhibits a depressed mood. He expresses no homicidal and no suicidal ideation. He expresses no suicidal plans and no homicidal plans. He is communicative. He exhibits abnormal recent memory. He exhibits normal remote memory. He is attentive.     Labs reviewed: Admission on  01/26/2013, Discharged on 01/29/2013  Component Date Value Ref Range Status  . Color, Urine 01/26/2013 YELLOW  YELLOW Final  . APPearance 01/26/2013 CLOUDY* CLEAR Final  . Specific Gravity, Urine 01/26/2013 1.017  1.005 - 1.030 Final  . pH 01/26/2013 5.5  5.0 - 8.0 Final  . Glucose, UA 01/26/2013 NEGATIVE  NEGATIVE mg/dL Final  . Hgb urine dipstick 01/26/2013 SMALL* NEGATIVE Final  . Bilirubin Urine 01/26/2013 NEGATIVE  NEGATIVE Final  . Ketones, ur 01/26/2013 NEGATIVE  NEGATIVE mg/dL Final  . Protein, ur 01/26/2013 NEGATIVE  NEGATIVE mg/dL Final  . Urobilinogen, UA 01/26/2013 0.2  0.0 - 1.0 mg/dL Final  . Nitrite 01/26/2013 NEGATIVE  NEGATIVE Final  . Leukocytes, UA 01/26/2013 SMALL* NEGATIVE Final  . WBC 01/26/2013 6.6  4.0 - 10.5 K/uL Final  . RBC 01/26/2013 3.86* 4.22 - 5.81 MIL/uL Final  . Hemoglobin 01/26/2013 11.4* 13.0 - 17.0 g/dL Final  . HCT 01/26/2013 35.2* 39.0 - 52.0 % Final  . MCV 01/26/2013 91.2  78.0 - 100.0 fL Final  . MCH 01/26/2013 29.5  26.0 - 34.0 pg Final  . MCHC 01/26/2013 32.4  30.0 - 36.0 g/dL Final  . RDW 01/26/2013 13.5  11.5 - 15.5 % Final  . Platelets 01/26/2013 196  150 - 400 K/uL Final  .  Neutrophils Relative % 01/26/2013 73  43 - 77 % Final  . Neutro Abs 01/26/2013 4.9  1.7 - 7.7 K/uL Final  . Lymphocytes Relative 01/26/2013 11* 12 - 46 % Final  . Lymphs Abs 01/26/2013 0.7  0.7 - 4.0 K/uL Final  . Monocytes Relative 01/26/2013 14* 3 - 12 % Final  . Monocytes Absolute 01/26/2013 0.9  0.1 - 1.0 K/uL Final  . Eosinophils Relative 01/26/2013 2  0 - 5 % Final  . Eosinophils Absolute 01/26/2013 0.1  0.0 - 0.7 K/uL Final  . Basophils Relative 01/26/2013 0  0 - 1 % Final  . Basophils Absolute 01/26/2013 0.0  0.0 - 0.1 K/uL Final  . Sodium 01/26/2013 138  135 - 145 mEq/L Final  . Potassium 01/26/2013 3.9  3.5 - 5.1 mEq/L Final  . Chloride 01/26/2013 106  96 - 112 mEq/L Final  . CO2 01/26/2013 20  19 - 32 mEq/L Final  . Glucose, Bld 01/26/2013 112* 70 - 99  mg/dL Final  . BUN 01/26/2013 75* 6 - 23 mg/dL Final  . Creatinine, Ser 01/26/2013 2.63* 0.50 - 1.35 mg/dL Final  . Calcium 01/26/2013 8.9  8.4 - 10.5 mg/dL Final  . Total Protein 01/26/2013 6.1  6.0 - 8.3 g/dL Final  . Albumin 01/26/2013 2.7* 3.5 - 5.2 g/dL Final  . AST 01/26/2013 16  0 - 37 U/L Final  . ALT 01/26/2013 14  0 - 53 U/L Final  . Alkaline Phosphatase 01/26/2013 49  39 - 117 U/L Final  . Total Bilirubin 01/26/2013 0.7  0.3 - 1.2 mg/dL Final  . GFR calc non Af Amer 01/26/2013 22* >90 mL/min Final  . GFR calc Af Amer 01/26/2013 25* >90 mL/min Final   Comment: (NOTE)                          The eGFR has been calculated using the CKD EPI equation.                          This calculation has not been validated in all clinical situations.                          eGFR's persistently <90 mL/min signify possible Chronic Kidney                          Disease.  . Lipase 01/26/2013 15  11 - 59 U/L Final  . Troponin I 01/26/2013 <0.30  <0.30 ng/mL Final   Comment:                                 Due to the release kinetics of cTnI,                          a negative result within the first hours                          of the onset of symptoms does not rule out                          myocardial infarction with certainty.  If myocardial infarction is still suspected,                          repeat the test at appropriate intervals.  . WBC, UA 01/26/2013 3-6  <3 WBC/hpf Final  . RBC / HPF 01/26/2013 3-6  <3 RBC/hpf Final  . Bacteria, UA 01/26/2013 RARE  RARE Final  . TSH 01/26/2013 2.756  0.350 - 4.500 uIU/mL Final   Performed at [facility]  . Sodium 01/27/2013 139  135 - 145 mEq/L Final  . Potassium 01/27/2013 4.0  3.5 - 5.1 mEq/L Final  . Chloride 01/27/2013 109  96 - 112 mEq/L Final  . CO2 01/27/2013 19  19 - 32 mEq/L Final  . Glucose, Bld 01/27/2013 148* 70 - 99 mg/dL Final  . BUN 01/27/2013 69* 6 - 23 mg/dL Final  . Creatinine, Ser  01/27/2013 2.85* 0.50 - 1.35 mg/dL Final  . Calcium 01/27/2013 8.0* 8.4 - 10.5 mg/dL Final  . GFR calc non Af Amer 01/27/2013 20* >90 mL/min Final  . GFR calc Af Amer 01/27/2013 23* >90 mL/min Final   Comment: (NOTE)                          The eGFR has been calculated using the CKD EPI equation.                          This calculation has not been validated in all clinical situations.                          eGFR's persistently <90 mL/min signify possible Chronic Kidney                          Disease.  . Sodium, Ur 01/27/2013 27   Final   Performed at [facility]  . Creatinine, Urine 01/27/2013 125.8   Final   Performed at [facility]  . Sodium 01/28/2013 143  135 - 145 mEq/L Final  . Potassium 01/28/2013 3.8  3.5 - 5.1 mEq/L Final  . Chloride 01/28/2013 117* 96 - 112 mEq/L Final  . CO2 01/28/2013 21  19 - 32 mEq/L Final  . Glucose, Bld 01/28/2013 138* 70 - 99 mg/dL Final  . BUN 01/28/2013 46* 6 - 23 mg/dL Final  . Creatinine, Ser 01/28/2013 1.68* 0.50 - 1.35 mg/dL Final   Comment: DELTA CHECK NOTED                          REPEATED TO VERIFY  . Calcium 01/28/2013 7.7* 8.4 - 10.5 mg/dL Final  . GFR calc non Af Amer 01/28/2013 37* >90 mL/min Final  . GFR calc Af Amer 01/28/2013 43* >90 mL/min Final   Comment: (NOTE)                          The eGFR has been calculated using the CKD EPI equation.                          This calculation has not been validated in all clinical situations.  eGFR's persistently <90 mL/min signify possible Chronic Kidney                          Disease.  Hospital Outpatient Visit on 01/09/2013  Component Date Value Ref Range Status  . WBC 01/09/2013 6.2  4.0 - 10.5 K/uL Final  . RBC 01/09/2013 4.56  4.22 - 5.81 MIL/uL Final  . Hemoglobin 01/09/2013 14.1  13.0 - 17.0 g/dL Final  . HCT 01/09/2013 42.6  39.0 - 52.0 % Final  . MCV 01/09/2013 93.4  78.0 - 100.0 fL Final  . MCH 01/09/2013 30.9  26.0 - 34.0  pg Final  . MCHC 01/09/2013 33.1  30.0 - 36.0 g/dL Final  . RDW 01/09/2013 13.3  11.5 - 15.5 % Final  . Platelets 01/09/2013 130* 150 - 400 K/uL Final  . Sodium 01/09/2013 143  135 - 145 mEq/L Final  . Potassium 01/09/2013 4.1  3.5 - 5.1 mEq/L Final  . Chloride 01/09/2013 109  96 - 112 mEq/L Final  . CO2 01/09/2013 25  19 - 32 mEq/L Final  . Glucose, Bld 01/09/2013 100* 70 - 99 mg/dL Final  . BUN 01/09/2013 36* 6 - 23 mg/dL Final  . Creatinine, Ser 01/09/2013 1.31  0.50 - 1.35 mg/dL Final  . Calcium 01/09/2013 8.9  8.4 - 10.5 mg/dL Final  . GFR calc non Af Amer 01/09/2013 50* >90 mL/min Final  . GFR calc Af Amer 01/09/2013 58* >90 mL/min Final   Comment: (NOTE)                          The eGFR has been calculated using the CKD EPI equation.                          This calculation has not been validated in all clinical situations.                          eGFR's persistently <90 mL/min signify possible Chronic Kidney                          Disease.      Assessment/Plan  1. Weakness generalized -PT and OT  2. 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 Difficulty in walking(719.7) -PT, OT  4. Bilateral leg edema improved  5. AKI (acute kidney injury) improved  6. Acute urinary retention -wean from F[place]

## 2013-09-28 ENCOUNTER — Encounter: Payer: Self-pay | Admitting: Nurse Practitioner

## 2013-10-02 ENCOUNTER — Encounter: Payer: Self-pay | Admitting: Nurse Practitioner

## 2013-10-02 ENCOUNTER — Non-Acute Institutional Stay (SKILLED_NURSING_FACILITY): Payer: Medicare Other | Admitting: Nurse Practitioner

## 2013-10-02 DIAGNOSIS — H1130 Conjunctival hemorrhage, unspecified eye: Secondary | ICD-10-CM

## 2013-10-02 DIAGNOSIS — H1132 Conjunctival hemorrhage, left eye: Secondary | ICD-10-CM

## 2013-10-02 DIAGNOSIS — K5900 Constipation, unspecified: Secondary | ICD-10-CM

## 2013-10-02 DIAGNOSIS — E291 Testicular hypofunction: Secondary | ICD-10-CM

## 2013-10-02 DIAGNOSIS — E039 Hypothyroidism, unspecified: Secondary | ICD-10-CM

## 2013-10-02 DIAGNOSIS — G43909 Migraine, unspecified, not intractable, without status migrainosus: Secondary | ICD-10-CM

## 2013-10-02 DIAGNOSIS — F411 Generalized anxiety disorder: Secondary | ICD-10-CM

## 2013-10-02 DIAGNOSIS — H10409 Unspecified chronic conjunctivitis, unspecified eye: Secondary | ICD-10-CM | POA: Insufficient documentation

## 2013-10-02 DIAGNOSIS — K5909 Other constipation: Secondary | ICD-10-CM

## 2013-10-02 DIAGNOSIS — H109 Unspecified conjunctivitis: Secondary | ICD-10-CM

## 2013-10-02 NOTE — Progress Notes (Signed)
Patient ID: Jose Leach, male   DOB: 03-21-1932, 80 y.o.   MRN: [id number]    Code Status: Full Code  Allergies  Allergen Reactions  . Latex Rash  . Tape Rash    Paper tape is ok to use     Chief Complaint  Patient presents with  . Medical Management of Chronic Issues  . Acute Visit    injected eyes R+L. Left subconjunctival hemorrhage.     HPI: Patient is a 80 y.o. male seen in the SNF at [facility]  today for evaluation of injected R+L eyes, R eye subconjunctival hemorrhage, f/u R leg pain/swelling/bruise lower 1/3 of thigh and knee and upper lower leg, R hip hematoma,  and other chronic medical conditions.     ED 09/05/13-evaluate s/p fallen twice in a couple of days at his independent living apartment.  He was started on Septra for possibility of urinary tract infection returned to his independent living apartment. Patient had more falls and was admitted to the skilled nursing facility level of [place] on 09/07/13.   While in the SNF level, he had another fall and was returned to the hospital on 09/09/13. A large right hip hematoma. He had generalized weakness. He was stabilized and returned to the skilled nursing facility at [facility] on 09/11/13.   Problem List Items Addressed This Visit   ANXIETY     Apparently stable on Zoloft 200mg , Wellbutrin 300mg , and Remeron 45mg . Clonopin 0.5mg  qhs and 0.25mg  bid prn.  F/u Dr. Neurology Jannifer [facility]      Chronic constipation     Managed with laxatives. Miralax daily prn, Senokot S I bid and II bid prn. Continue Colace  bid. Continue Linzess.       Conjunctivitis     No discharge, no itching, no pain today. Will have Naphcon A I gtt OU tid until redness resolves. Dc Gentamicin ophthalmic sol previous obtained by Tel consultation with PCP.     HYPOTHYROIDISM     Takes Levothyroxine 3mcg daily except Sunday 33mcg. Last TSH wnl while in hospital.       MIGRAINE HEADACHE     Controlled with Topmax.      Other testicular hypofunction     Supplemented with Testosterone 50mg  topically qd.       Subconjunctival hemorrhage of left eye - Primary     No itching or pain, no change in vision. It should healed w/o intervention.        Review of Systems:  Review of Systems  Constitutional: Negative for fever, chills, weight loss, malaise/fatigue and diaphoresis.  HENT: Positive for hearing loss. Negative for congestion, ear discharge, ear pain, nosebleeds, sore throat and tinnitus.   Eyes: Positive for redness. Negative for blurred vision, double vision, photophobia, pain and discharge.  Respiratory: Negative for cough, hemoptysis, sputum production, shortness of breath, wheezing and stridor.   Cardiovascular: Positive for PND. Negative for chest pain, palpitations, orthopnea, claudication and leg swelling.  Gastrointestinal: Negative for heartburn, nausea, vomiting, abdominal pain, diarrhea, constipation, blood in stool and melena.  Genitourinary: Negative for dysuria, urgency, frequency, hematuria and flank pain.  Musculoskeletal: Positive for back pain, falls, joint pain and neck pain. Negative for myalgias.       Pain in buttocks.   Skin: Negative for itching and rash.       R hip hematoma-smaller-about a ping pong ball size now. Sacral coccygeal area beefy redness and superficial skin missing spots-improving. Swelling, bruise, mild heat, and  pain in the 1/3 lower R thigh, knee, and 1/3 upper R lower leg-resolving.     Neurological: Positive for sensory change, speech change (low voice), weakness and headaches (treated). Negative for dizziness, tingling, tremors, focal weakness, seizures and loss of consciousness.  Endo/Heme/Allergies: Negative for environmental allergies and polydipsia. Does not bruise/bleed easily.  Psychiatric/Behavioral: Positive for depression and memory loss. Negative for suicidal ideas, hallucinations and substance abuse. The patient is nervous/anxious. The patient does  not have insomnia.      Past Medical History  Diagnosis Date  . Migraines     Dr Jannifer [facility]  . Peripheral neuropathy   . Benign essential tremor   . Cervical stenosis of spine   . Anemia     B12 deficiency  . Testosterone deficiency     Dr Lawerance Bach, [facility]  . Hypothyroidism   . Hyperlipidemia   . Depression     Dr Albertine Patricia  . History of shingles 2009  . H/O cardiovascular stress test     a. 02/2003 -> no ischemia/infarct.  Marland Kitchen Hx of echocardiogram     Echocardiogram 08/01/12: Mild LVH, EF 55-60%, normal wall motion.  Marland Kitchen PAD (peripheral artery disease)   . Carotid stenosis     s/p L CEA  . Fecal impaction 10/11/2012  . Macular edema   . Cervical spinal stenosis   . Hematoma of leg   . Acute urinary retention 01/28/2013  . AKI (acute kidney injury) 01/26/2013  . Altered mental status 09/09/2013  . ANXIETY 06/07/2007    Qualifier: Diagnosis of  By: Talbert Cage CMA ([place]), June    . ARTHRITIS 06/07/2007    Qualifier: Diagnosis of  By: Talbert Cage CMA ([place]), June    . Balance problems 04/05/2011  . Benign fibroma of prostate 10/11/2011  . Bilateral inguinal hernia 10/09/2012  . Bilateral leg edema 09/13/2012  . Bladder retention 02/05/2013  . Bradycardia, sinus 08/02/2012  . 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 CAROTID ARTERY DISEASE 03/31/2007    Qualifier: Diagnosis of  By: Linna Darner MD, Gwyndolyn Saxon    . Degeneration macular 11/02/2011  . Difficulty in walking(719.7) 04/05/2011  . ESOPHAGITIS 08/06/2002    Qualifier: Diagnosis of  By: Talbert Cage CMA ([place]), June    . Essential and other specified forms of tremor 04/18/2013  . Family history of colon cancer 07/24/2012  . Femoral hernia, bilateral s/p lap repair 01/16/2013 01/16/2013  . HAMMER TOE 04/23/2010    Qualifier: Diagnosis of  By: Linna Darner MD, Gwyndolyn Saxon    . Hip hematoma, right 09/09/2013  . HTN (hypertension) 08/22/2012  . Hydrocele 10/11/2011  . 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 MORTON'S NEUROMA, LEFT 08/22/2007    Qualifier: Diagnosis of  By: Linna Darner  MD, Gwyndolyn Saxon    . Personal history of colonic polyps 01/05/2013    2014 2 mm adenomatous polyp   . PREMATURE ATRIAL CONTRACTIONS 03/14/2006    Qualifier: Diagnosis of  By: Linna Darner MD, Gwendolyn [place]ima 12/16/2011  . SPINAL STENOSIS 03/14/2006    Qualifier: Diagnosis of  By: Linna Darner MD, William   Cervical spine    . Unspecified vitamin D deficiency 08/06/2012  . Urge incontinence 10/09/2012  . Urinary tract infection, site not specified 02/13/2013  . Weakness 08/02/2012   Past Surgical History  Procedure Laterality Date  . Appendectomy  1941  . Lumbar laminectomy    . Carotid endarterectomy  2005     L  . Tonsillectomy and adenoidectomy    . Total hip arthroplasty  2005  L  . Cataract extraction Right   . Eye surgery Right 02/2012    retina  . Inguinal hernia repair Bilateral 01/16/2013    Procedure: LAPAROSCOPIC BILATERAL INGUINAL DIRECT AND INDIRECT, FEMORAL, AND OBTURATOR HERNIAS;  Surgeon: Adin Hector, MD;  Location: [place];  Service: General;  Laterality: Bilateral;  . Insertion of mesh N/A 01/16/2013    Procedure: INSERTION OF MESH;  Surgeon: Adin Hector, MD;  Location: [place];  Service: General;  Laterality: N/A;  . Breast cyst excision Left 01/16/2013    Procedure: MASS EXCISION AXILLA;  Surgeon: Adin Hector, MD;  Location: [place];  Service: General;  Laterality: Left;   Social History:   reports that he quit smoking about 34 years ago. His smoking use included Pipe and Cigars. He has never used smokeless tobacco. He reports that he drinks about 4.2 ounces of alcohol per week. He reports that he does not use illicit drugs.  Family History  Problem Relation Age of Onset  . Stroke Mother 17  . Hypertension Mother   . Diabetes Mother   . Heart disease Mother   . Rectal cancer Father 32  . Heart disease Father     in 54s  . Cancer Father     colorectal  . Nephritis Sister   . Seizures Brother     died as child    Medications: Patient's Medications  New  Prescriptions   No medications on file  Previous Medications   ACETAMINOPHEN (TYLENOL) 325 MG TABLET    Take 2 tablets (650 mg total) by mouth every 6 (six) hours as needed for mild pain (or Fever >/= 101).   ASPIRIN 81 MG TABLET    Take 81 mg by mouth every morning.    ATORVASTATIN (LIPITOR) 20 MG TABLET    Take one tablet by mouth once daily in the evening for cholesterol   B COMPLEX-C (B-COMPLEX WITH VITAMIN C) TABLET    Take 1 tablet by mouth every morning.   BIMATOPROST (LUMIGAN) 0.01 % SOLN    Place 1 drop into both eyes at bedtime.   BRIMONIDINE (ALPHAGAN) 0.2 % OPHTHALMIC SOLUTION    Place one drop into the right eye two times daily   BUPROPION (WELLBUTRIN XL) 300 MG 24 HR TABLET    Take 300 mg by mouth every morning.    CHOLECALCIFEROL (VITAMIN D) 1000 UNITS TABLET    Take 2,000 Units by mouth 2 (two) times daily.    CLONAZEPAM (KLONOPIN) 0.5 MG TABLET    Take 0.5 tablets (0.25 mg total) by mouth 2 (two) times daily as needed (anxiety, agitation).   CLONAZEPAM (KLONOPIN) 0.5 MG TABLET    Take 1 tablet (0.5 mg total) by mouth at bedtime.   DOCUSATE SODIUM 100 MG CAPS    Take 100 mg by mouth 2 (two) times daily.   FLAXSEED, LINSEED, (FLAX SEEDS PO)    Take 1 tablet by mouth every morning.    HYDROCODONE-ACETAMINOPHEN (NORCO/VICODIN) 5-325 MG PER TABLET    Take 1 tablet by mouth every 4 (four) hours as needed for moderate pain.   KRILL OIL 500 MG CAPS    Take 500 mg by mouth every morning.    L-METHYLFOLATE 7.5 MG TABS    Take 7.5 mg by mouth daily.   LACTOSE FREE NUTRITION (BOOST PLUS) LIQD    Take 237 mLs by mouth 3 (three) times daily with meals.   LEVOTHYROXINE (SYNTHROID, LEVOTHROID) 50 MCG TABLET    Take one  tablet by mouth 30 minutes before breakfast on empty stomach once daily except on Sunday take 1/2 tablet for thyroid   LINACLOTIDE (LINZESS) 290 MCG CAPS CAPSULE    Take 290 mcg by mouth daily.   MIRTAZAPINE (REMERON) 45 MG TABLET    Take 45 mg by mouth at bedtime.   MULTIPLE  VITAMINS-MINERALS (ICAPS AREDS FORMULA PO)    Take 1 capsule by mouth daily.    POLYETHYLENE GLYCOL (MIRALAX / GLYCOLAX) PACKET    Take 17 g by mouth daily as needed for mild constipation.   SENNOSIDES-DOCUSATE SODIUM (SENOKOT-S) 8.6-50 MG TABLET    Take 2 tablets by mouth 2 (two) times daily as needed.    SERTRALINE (ZOLOFT) 100 MG TABLET    Take two tablets by mouth once daily in the evening   TESTOSTERONE ([name]) 50 MG/5GM GEL    Place 5 g onto the skin every morning.    TOPIRAMATE (TOPAMAX) 25 MG TABLET    Take 75 mg by mouth daily.   VITAMIN B-12 (CYANOCOBALAMIN) 1000 MCG TABLET    Take 1,000 mcg by mouth every morning.   Modified Medications   No medications on file  Discontinued Medications   No medications on file     Physical Exam: Physical Exam  Constitutional: He is oriented to person, place, and time. He appears well-developed and well-nourished. No distress.  HENT:  Head: Normocephalic and atraumatic.  Right Ear: External ear normal.  Left Ear: External ear normal.  Nose: Nose normal.  Mouth/Throat: Oropharynx is clear and moist. No oropharyngeal exudate.  Eyes: Conjunctivae and EOM are normal. Pupils are equal, round, and reactive to light. Right eye exhibits no discharge. Left eye exhibits no discharge. No scleral icterus.  R+L eyes: injected, no discharge, denied itching or pain. R subconjunctival hemorrhage.   Neck: Normal range of motion. Neck supple. No JVD present. No tracheal deviation present. No thyromegaly present.  Cardiovascular: Normal rate, regular rhythm, normal heart sounds and intact distal pulses.   No murmur heard. Pulmonary/Chest: Effort normal and breath sounds normal. No stridor. No respiratory distress. He has no wheezes. He has no rales. He exhibits no tenderness.  Abdominal: Soft. Bowel sounds are normal. He exhibits no distension. There is no tenderness. There is no rebound and no guarding.  Genitourinary:  A small external hemorrhoid 5pm    Musculoskeletal: Normal range of motion. He exhibits tenderness. He exhibits no edema.  Pain in his buttocks-feels like hemorrhoids-there is a small external  Hemorrhoid 5pm w/o bleeding-relieves with Hydrocodone/ApAp. Sacral coccygeal area beefy redness and superficial skin missing spots   Lymphadenopathy:    He has no cervical adenopathy.  Neurological: He is alert and oriented to person, place, and time. He has normal reflexes. No cranial nerve deficit. He exhibits normal muscle tone. Coordination normal.  Skin: Skin is warm and dry. No rash noted. He is not diaphoretic. No erythema. No pallor.  R hip hematoma-smaller-about a ping pong ball size now. Sacral coccygeal area beefy redness and superficial skin missing spots-improving. Swelling, bruise, mild heat, and pain in the 1/3 lower R thigh, knee, and 1/3 upper R lower leg-resolving.      Psychiatric: His mood appears anxious. His affect is not angry, not blunt, not labile and not inappropriate. His speech is not rapid and/or pressured, not delayed, not tangential and not slurred. He is not agitated, not aggressive, not hyperactive, not slowed, not withdrawn, not actively hallucinating and not combative. Thought content is not paranoid and not delusional.  Cognition and memory are impaired. He does not express impulsivity or inappropriate judgment. He exhibits a depressed mood. He expresses no homicidal and no suicidal ideation. He expresses no suicidal plans and no homicidal plans. He is communicative. He exhibits abnormal recent memory. He exhibits normal remote memory. He is attentive.    Filed Vitals:   10/02/13 1331  BP: 146/77  Pulse: 56  Temp: 96.4 F (35.8 C)  TempSrc: Tympanic  Resp: 18      Labs reviewed: Basic Metabolic Panel:  Recent Labs  01/26/13 2210  09/07/13 1209 09/09/13 1807 09/10/13 0139  NA  --   < > 140 141 139  K  --   < > 4.2 4.7 4.8  CL  --   < > 105 107 107  CO2  --   < > 25 20 20   GLUCOSE  --    < > 85 79 85  BUN  --   < > 38* 38* 34*  CREATININE  --   < > 1.40* 1.29 1.17  CALCIUM  --   < > 9.0 8.5 8.3*  MG  --   --   --   --  1.7  PHOS  --   --   --   --  2.7  TSH 2.756  --   --   --  4.390  < > = values in this interval not displayed. Liver Function Tests:  Recent Labs  09/07/13 1209 09/09/13 1807 09/10/13 0139  AST 29 29 23   ALT 26 31 27   ALKPHOS 53 53 48  BILITOT 0.3 <0.2* 0.2*  PROT 6.4 6.1 5.9*  ALBUMIN 3.2* 3.0* 2.8*    Recent Labs  01/26/13 1900  LIPASE 15  CBC:  Recent Labs  01/26/13 1855 09/05/13 1206 09/07/13 1209 09/09/13 1807 09/10/13 0139  WBC 6.6 6.3 5.0 5.7 7.3  NEUTROABS 4.9 4.2 3.3  --   --   HGB 11.4* 15.0 14.2 13.8 13.2  HCT 35.2* 46.3 44.5 42.6 41.1  MCV 91.2 93.0 92.5 93.4 92.2  PLT 196 163 152 165 165   No results found for this basename: FOLATE, IRON, VITAMINB12,  in the last 8760 hours  Past Procedures:  08/02/12 2D echocardiogram:  LV EF: 55% -   60%  07/30/12 CT head  IMPRESSION: 1.  No acute intracranial abnormalities. 2.  Mild cerebral and cerebellar atrophy with chronic microvascular ischemic changes of the cerebral white matter, similar to the prior Study.  08/01/12 MRI brain:  IMPRESSION: Motion degraded exam.   No acute infarct.   Moderate small vessel disease type changes.   Global atrophy.   09/05/13 MRI head w/o CM:  IMPRESSION:  No acute infarction. Extensive and widespread chronic small-vessel  ischemic changes throughout the brain.   09/09/13 X-ray Pelvis  IMPRESSION:  No acute abnormality. Moderately severe arthritis of the right hip  with chondrocalcinosis.  09/09/13 CXR  IMPRESSION:  1. Mild elevation of the right hemidiaphragm is new compared to the  prior study, but is of uncertain the etiology and significance.  09/09/13 CT head w/o contrast:  IMPRESSION:  No acute intracranial abnormality. Chronic atrophy and chronic small  vessel ischemic disease with tiny lacunar infarcts,  stable.  09/09/13 Echocardiogram LV EF: 65%  09/25/13   X-ray R ankle, Femur, Knee, Tibia/Fibula: no acute bony abnormalities   09/25/13 [place] RLE: negative for venous thrombosis at major veins of right lower extremity.   Assessment/Plan Subconjunctival hemorrhage of left eye No itching or  pain, no change in vision. It should healed w/o intervention.   Conjunctivitis No discharge, no itching, no pain today. Will have Naphcon A I gtt OU tid until redness resolves. Dc Gentamicin ophthalmic sol previous obtained by Tel consultation with PCP.   Chronic constipation Managed with laxatives. Miralax daily prn, Senokot S I bid and II bid prn. Continue Colace  bid. Continue Linzess.     HYPOTHYROIDISM Takes Levothyroxine 88mcg daily except Sunday 83mcg. Last TSH wnl while in hospital.     ANXIETY Apparently stable on Zoloft 200mg , Wellbutrin 300mg , and Remeron 45mg . Clonopin 0.5mg  qhs and 0.25mg  bid prn.  F/u Dr. Neurology Jannifer [facility]    MIGRAINE HEADACHE Controlled with Topmax.    Other testicular hypofunction Supplemented with Testosterone 50mg  topically qd.       Family/ Staff Communication: observe the patient.   Goals of Care: IL  Labs/tests ordered: nonte.

## 2013-10-02 NOTE — Assessment & Plan Note (Signed)
Takes Levothyroxine 80mcg daily except Sunday 61mcg. Last TSH wnl while in hospital.

## 2013-10-02 NOTE — Assessment & Plan Note (Signed)
No itching or pain, no change in vision. It should healed w/o intervention.

## 2013-10-02 NOTE — Assessment & Plan Note (Signed)
Managed with laxatives. Miralax daily prn, Senokot S I bid and II bid prn. Continue Colace  bid. Continue Linzess.

## 2013-10-02 NOTE — Assessment & Plan Note (Signed)
Controlled with Topmax.

## 2013-10-02 NOTE — Assessment & Plan Note (Signed)
Supplemented with Testosterone 50mg  topically qd.

## 2013-10-02 NOTE — Assessment & Plan Note (Signed)
No discharge, no itching, no pain today. Will have Naphcon A I gtt OU tid until redness resolves. Dc Gentamicin ophthalmic sol previous obtained by Tel consultation with PCP.

## 2013-10-02 NOTE — Assessment & Plan Note (Signed)
Apparently stable on Zoloft 200mg , Wellbutrin 300mg , and Remeron 45mg . Clonopin 0.5mg  qhs and 0.25mg  bid prn.  F/u Dr. Neurology Jannifer Franklin

## 2013-10-05 ENCOUNTER — Encounter: Payer: Self-pay | Admitting: Nurse Practitioner

## 2013-10-05 ENCOUNTER — Non-Acute Institutional Stay (SKILLED_NURSING_FACILITY): Payer: Medicare Other | Admitting: Nurse Practitioner

## 2013-10-05 DIAGNOSIS — E291 Testicular hypofunction: Secondary | ICD-10-CM

## 2013-10-05 DIAGNOSIS — F411 Generalized anxiety disorder: Secondary | ICD-10-CM

## 2013-10-05 DIAGNOSIS — R29818 Other symptoms and signs involving the nervous system: Secondary | ICD-10-CM

## 2013-10-05 DIAGNOSIS — E039 Hypothyroidism, unspecified: Secondary | ICD-10-CM

## 2013-10-05 DIAGNOSIS — H1132 Conjunctival hemorrhage, left eye: Secondary | ICD-10-CM

## 2013-10-05 DIAGNOSIS — H1130 Conjunctival hemorrhage, unspecified eye: Secondary | ICD-10-CM

## 2013-10-05 DIAGNOSIS — K5909 Other constipation: Secondary | ICD-10-CM

## 2013-10-05 DIAGNOSIS — K5900 Constipation, unspecified: Secondary | ICD-10-CM

## 2013-10-05 DIAGNOSIS — G43909 Migraine, unspecified, not intractable, without status migrainosus: Secondary | ICD-10-CM

## 2013-10-05 DIAGNOSIS — R2689 Other abnormalities of gait and mobility: Secondary | ICD-10-CM

## 2013-10-05 NOTE — Assessment & Plan Note (Signed)
Supplemented with Testosterone 50mg  topically qd.

## 2013-10-05 NOTE — Assessment & Plan Note (Signed)
Healing nicely.  

## 2013-10-05 NOTE — Progress Notes (Signed)
Patient ID: Jose Leach, male   DOB: [birth year], 80 y.o.   MRN: [id number]    Code Status: Full Code  Allergies  Allergen Reactions  . Latex Rash  . Tape Rash    Paper tape is ok to use     Chief Complaint  Patient presents with  . Medical Management of Chronic Issues    HPI: Patient is a 80 y.o. male seen in the SNF at [facility]  today for evaluation of generalized weakness,  treated injected R+L eyes, R eye subconjunctival hemorrhage, f/u R leg pain/swelling/bruise lower 1/3 of thigh and knee and upper lower leg, R hip hematoma,  and other chronic medical conditions.     ED 09/05/13-evaluate s/p fallen twice in a couple of days at his independent living apartment.  He was started on Septra for possibility of urinary tract infection returned to his independent living apartment. Patient had more falls and was admitted to the skilled nursing facility level of [place] on 09/07/13.   While in the SNF level, he had another fall and was returned to the hospital on 09/09/13. A large right hip hematoma. He had generalized weakness. He was stabilized and returned to the skilled nursing facility at [facility] on 09/11/13.   Problem List Items Addressed This Visit   ANXIETY     Apparently stable on Zoloft 200mg , Wellbutrin 300mg , and Remeron 45mg . Clonopin 0.5mg  qhs and 0.25mg  bid prn.  F/u Dr. Neurology Jannifer Franklin      Balance problems - Primary     10/05/13 Merrilee Jansky Balance Assessment 34/56-high risk for falling.     Chronic constipation     Managed with laxatives. Miralax daily prn, Senokot S I bid and II bid prn. Continue Colace  bid. Continue Linzess.       HYPOTHYROIDISM     Takes Levothyroxine 35mcg daily except Sunday 2mcg. Last TSH wnl while in hospital.       MIGRAINE HEADACHE     Controlled with Topmax.      Other testicular hypofunction     Supplemented with Testosterone 50mg  topically qd.        Subconjunctival hemorrhage of left eye   Healing nicely.        Review of Systems:  Review of Systems  Constitutional: Negative for fever, chills, weight loss, malaise/fatigue and diaphoresis.       Barthel index of ADL 10/05/13 10/20-50% deficit  HENT: Positive for hearing loss. Negative for congestion, ear discharge, ear pain, nosebleeds, sore throat and tinnitus.   Eyes: Positive for redness. Negative for blurred vision, double vision, photophobia, pain and discharge.       Resolving.   Respiratory: Negative for cough, hemoptysis, sputum production, shortness of breath, wheezing and stridor.   Cardiovascular: Positive for PND. Negative for chest pain, palpitations, orthopnea, claudication and leg swelling.  Gastrointestinal: Negative for heartburn, nausea, vomiting, abdominal pain, diarrhea, constipation, blood in stool and melena.  Genitourinary: Negative for dysuria, urgency, frequency, hematuria and flank pain.       Incontinent of B+B frequently.   Musculoskeletal: Positive for back pain, falls, joint pain and neck pain. Negative for myalgias.       10/05/13 Berg Balance Assessment: 34/56-hight risk for falling.   Skin: Negative for itching and rash.       R hip hematoma-smaller-about a ping pong ball size now. Sacral coccygeal area beefy redness and superficial skin missing spots-improving. Swelling, bruise, mild heat, and pain in the 1/3 lower  R thigh, knee, and 1/3 upper R lower leg-resolving.     Neurological: Positive for sensory change, speech change (low voice), weakness and headaches (treated). Negative for dizziness, tingling, tremors, focal weakness, seizures and loss of consciousness.  Endo/Heme/Allergies: Negative for environmental allergies and polydipsia. Does not bruise/bleed easily.  Psychiatric/Behavioral: Positive for depression and memory loss. Negative for suicidal ideas, hallucinations and substance abuse. The patient is nervous/anxious. The patient does not have insomnia.      Past Medical History    Diagnosis Date  . Migraines     Dr Jannifer Franklin  . Peripheral neuropathy   . Benign essential tremor   . Cervical stenosis of spine   . Anemia     B12 deficiency  . Testosterone deficiency     Dr Lawerance Bach, [facility]  . Hypothyroidism   . Hyperlipidemia   . Depression     Dr Albertine Patricia  . History of shingles 2009  . H/O cardiovascular stress test     a. 02/2003 -> no ischemia/infarct.  Marland Kitchen Hx of echocardiogram     Echocardiogram 08/01/12: Mild LVH, EF 55-60%, normal wall motion.  Marland Kitchen PAD (peripheral artery disease)   . Carotid stenosis     s/p L CEA  . Fecal impaction 10/11/2012  . Macular edema   . Cervical spinal stenosis   . Hematoma of leg   . Acute urinary retention 01/28/2013  . AKI (acute kidney injury) 01/26/2013  . Altered mental status 09/09/2013  . ANXIETY 06/07/2007    Qualifier: Diagnosis of  By: Talbert Cage CMA ([place]), June    . ARTHRITIS 06/07/2007    Qualifier: Diagnosis of  By: Talbert Cage CMA ([place]), June    . Balance problems 04/05/2011  . Benign fibroma of prostate 10/11/2011  . Bilateral inguinal hernia 10/09/2012  . Bilateral leg edema 09/13/2012  . Bladder retention 02/05/2013  . Bradycardia, sinus 08/02/2012  . 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 CAROTID ARTERY DISEASE 03/31/2007    Qualifier: Diagnosis of  By: Linna Darner MD, Gwyndolyn Saxon    . Degeneration macular 11/02/2011  . Difficulty in walking(719.7) 04/05/2011  . ESOPHAGITIS 08/06/2002    Qualifier: Diagnosis of  By: Talbert Cage CMA ([place]), June    . Essential and other specified forms of tremor 04/18/2013  . Family history of colon cancer 07/24/2012  . Femoral hernia, bilateral s/p lap repair 01/16/2013 01/16/2013  . HAMMER TOE 04/23/2010    Qualifier: Diagnosis of  By: Linna Darner MD, Gwyndolyn Saxon    . Hip hematoma, right 09/09/2013  . HTN (hypertension) 08/22/2012  . Hydrocele 10/11/2011  . 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 MORTON'S NEUROMA, LEFT 08/22/2007    Qualifier: Diagnosis of  By: Linna Darner MD, Gwyndolyn Saxon    . Personal history of colonic  polyps 01/05/2013    2014 2 mm adenomatous polyp   . PREMATURE ATRIAL CONTRACTIONS 03/14/2006    Qualifier: Diagnosis of  By: Linna Darner MD, Gwendolyn Lima 12/16/2011  . SPINAL STENOSIS 03/14/2006    Qualifier: Diagnosis of  By: Linna Darner MD, William   Cervical spine    . Unspecified vitamin D deficiency 08/06/2012  . Urge incontinence 10/09/2012  . Urinary tract infection, site not specified 02/13/2013  . Weakness 08/02/2012   Past Surgical History  Procedure Laterality Date  . Appendectomy  1941  . Lumbar laminectomy    . Carotid endarterectomy  2005     L  . Tonsillectomy and adenoidectomy    . Total hip arthroplasty  2005    L  . Cataract  extraction Right   . Eye surgery Right 02/2012    retina  . Inguinal hernia repair Bilateral 01/16/2013    Procedure: LAPAROSCOPIC BILATERAL INGUINAL DIRECT AND INDIRECT, FEMORAL, AND OBTURATOR HERNIAS;  Surgeon: Adin Hector, MD;  Location: [place];  Service: General;  Laterality: Bilateral;  . Insertion of mesh N/A 01/16/2013    Procedure: INSERTION OF MESH;  Surgeon: Adin Hector, MD;  Location: [place];  Service: General;  Laterality: N/A;  . Breast cyst excision Left 01/16/2013    Procedure: MASS EXCISION AXILLA;  Surgeon: Adin Hector, MD;  Location: [place];  Service: General;  Laterality: Left;   Social History:   reports that he quit smoking about 34 years ago. His smoking use included Pipe and Cigars. He has never used smokeless tobacco. He reports that he drinks about 4.2 ounces of alcohol per week. He reports that he does not use illicit drugs.  Family History  Problem Relation Age of Onset  . Stroke Mother 79  . Hypertension Mother   . Diabetes Mother   . Heart disease Mother   . Rectal cancer Father 60  . Heart disease Father     in 70s  . Cancer Father     colorectal  . Nephritis Sister   . Seizures Brother     died as child    Medications: Patient's Medications  New Prescriptions   No medications on file    Previous Medications   ACETAMINOPHEN (TYLENOL) 325 MG TABLET    Take 2 tablets (650 mg total) by mouth every 6 (six) hours as needed for mild pain (or Fever >/= 101).   ASPIRIN 81 MG TABLET    Take 81 mg by mouth every morning.    ATORVASTATIN (LIPITOR) 20 MG TABLET    Take one tablet by mouth once daily in the evening for cholesterol   B COMPLEX-C (B-COMPLEX WITH VITAMIN C) TABLET    Take 1 tablet by mouth every morning.   BIMATOPROST (LUMIGAN) 0.01 % SOLN    Place 1 drop into both eyes at bedtime.   BRIMONIDINE (ALPHAGAN) 0.2 % OPHTHALMIC SOLUTION    Place one drop into the right eye two times daily   BUPROPION (WELLBUTRIN XL) 300 MG 24 HR TABLET    Take 300 mg by mouth every morning.    CHOLECALCIFEROL (VITAMIN D) 1000 UNITS TABLET    Take 2,000 Units by mouth 2 (two) times daily.    CLONAZEPAM (KLONOPIN) 0.5 MG TABLET    Take 0.5 tablets (0.25 mg total) by mouth 2 (two) times daily as needed (anxiety, agitation).   CLONAZEPAM (KLONOPIN) 0.5 MG TABLET    Take 1 tablet (0.5 mg total) by mouth at bedtime.   DOCUSATE SODIUM 100 MG CAPS    Take 100 mg by mouth 2 (two) times daily.   FLAXSEED, LINSEED, (FLAX SEEDS PO)    Take 1 tablet by mouth every morning.    HYDROCODONE-ACETAMINOPHEN (NORCO/VICODIN) 5-325 MG PER TABLET    Take 1 tablet by mouth every 4 (four) hours as needed for moderate pain.   KRILL OIL 500 MG CAPS    Take 500 mg by mouth every morning.    L-METHYLFOLATE 7.5 MG TABS    Take 7.5 mg by mouth daily.   LACTOSE FREE NUTRITION (BOOST PLUS) LIQD    Take 237 mLs by mouth 3 (three) times daily with meals.   LEVOTHYROXINE (SYNTHROID, LEVOTHROID) 50 MCG TABLET    Take one tablet by mouth  30 minutes before breakfast on empty stomach once daily except on Sunday take 1/2 tablet for thyroid   LINACLOTIDE (LINZESS) 290 MCG CAPS CAPSULE    Take 290 mcg by mouth daily.   MIRTAZAPINE (REMERON) 45 MG TABLET    Take 45 mg by mouth at bedtime.   MULTIPLE VITAMINS-MINERALS (ICAPS AREDS FORMULA PO)     Take 1 capsule by mouth daily.    POLYETHYLENE GLYCOL (MIRALAX / GLYCOLAX) PACKET    Take 17 g by mouth daily as needed for mild constipation.   SENNOSIDES-DOCUSATE SODIUM (SENOKOT-S) 8.6-50 MG TABLET    Take 2 tablets by mouth 2 (two) times daily as needed.    SERTRALINE (ZOLOFT) 100 MG TABLET    Take two tablets by mouth once daily in the evening   TESTOSTERONE ([name]) 50 MG/5GM GEL    Place 5 g onto the skin every morning.    TOPIRAMATE (TOPAMAX) 25 MG TABLET    Take 75 mg by mouth daily.   VITAMIN B-12 (CYANOCOBALAMIN) 1000 MCG TABLET    Take 1,000 mcg by mouth every morning.   Modified Medications   No medications on file  Discontinued Medications   No medications on file     Physical Exam: Physical Exam  Constitutional: He is oriented to person, place, and time. He appears well-developed and well-nourished. No distress.  HENT:  Head: Normocephalic and atraumatic.  Right Ear: External ear normal.  Left Ear: External ear normal.  Nose: Nose normal.  Mouth/Throat: Oropharynx is clear and moist. No oropharyngeal exudate.  Eyes: Conjunctivae and EOM are normal. Pupils are equal, round, and reactive to light. Right eye exhibits no discharge. Left eye exhibits no discharge. No scleral icterus.  R+L eyes: injected, no discharge, denied itching or pain. R subconjunctival hemorrhage-healing nicely.   Neck: Normal range of motion. Neck supple. No JVD present. No tracheal deviation present. No thyromegaly present.  Cardiovascular: Normal rate, regular rhythm, normal heart sounds and intact distal pulses.   No murmur heard. Pulmonary/Chest: Effort normal and breath sounds normal. No stridor. No respiratory distress. He has no wheezes. He has no rales. He exhibits no tenderness.  Abdominal: Soft. Bowel sounds are normal. He exhibits no distension. There is no tenderness. There is no rebound and no guarding.  Genitourinary:  A small external hemorrhoid 5pm  Musculoskeletal: Normal range of  motion. He exhibits tenderness. He exhibits no edema.  Pain in his buttocks-feels like hemorrhoids-there is a small external  Hemorrhoid 5pm w/o bleeding-relieves with Hydrocodone/ApAp. Sacral coccygeal area beefy redness and superficial skin missing spots   Lymphadenopathy:    He has no cervical adenopathy.  Neurological: He is alert and oriented to person, place, and time. He has normal reflexes. No cranial nerve deficit. He exhibits normal muscle tone. Coordination normal.  Skin: Skin is warm and dry. No rash noted. He is not diaphoretic. No erythema. No pallor.  R hip hematoma-smaller-about a ping pong ball size now. Sacral coccygeal area beefy redness and superficial skin missing spots-improving. Swelling, bruise, mild heat, and pain in the 1/3 lower R thigh, knee, and 1/3 upper R lower leg-resolving.      Psychiatric: His mood appears anxious. His affect is not angry, not blunt, not labile and not inappropriate. His speech is not rapid and/or pressured, not delayed, not tangential and not slurred. He is not agitated, not aggressive, not hyperactive, not slowed, not withdrawn, not actively hallucinating and not combative. Thought content is not paranoid and not delusional. Cognition and memory  are impaired. He does not express impulsivity or inappropriate judgment. He exhibits a depressed mood. He expresses no homicidal and no suicidal ideation. He expresses no suicidal plans and no homicidal plans. He is communicative. He exhibits abnormal recent memory. He exhibits normal remote memory. He is attentive.    Filed Vitals:   10/05/13 1114  BP: 158/71  Pulse: 54  Temp: 97.3 F (36.3 C)  TempSrc: Tympanic  Resp: 20      Labs reviewed: Basic Metabolic Panel:  Recent Labs  01/26/13 2210  09/07/13 1209 09/09/13 1807 09/10/13 0139  NA  --   < > 140 141 139  K  --   < > 4.2 4.7 4.8  CL  --   < > 105 107 107  CO2  --   < > 25 20 20   GLUCOSE  --   < > 85 79 85  BUN  --   < > 38*  38* 34*  CREATININE  --   < > 1.40* 1.29 1.17  CALCIUM  --   < > 9.0 8.5 8.3*  MG  --   --   --   --  1.7  PHOS  --   --   --   --  2.7  TSH 2.756  --   --   --  4.390  < > = values in this interval not displayed. Liver Function Tests:  Recent Labs  09/07/13 1209 09/09/13 1807 09/10/13 0139  AST 29 29 23   ALT 26 31 27   ALKPHOS 53 53 48  BILITOT 0.3 <0.2* 0.2*  PROT 6.4 6.1 5.9*  ALBUMIN 3.2* 3.0* 2.8*    Recent Labs  01/26/13 1900  LIPASE 15  CBC:  Recent Labs  01/26/13 1855 09/05/13 1206 09/07/13 1209 09/09/13 1807 09/10/13 0139  WBC 6.6 6.3 5.0 5.7 7.3  NEUTROABS 4.9 4.2 3.3  --   --   HGB 11.4* 15.0 14.2 13.8 13.2  HCT 35.2* 46.3 44.5 42.6 41.1  MCV 91.2 93.0 92.5 93.4 92.2  PLT 196 163 152 165 165   No results found for this basename: FOLATE, IRON, VITAMINB12,  in the last 8760 hours  Past Procedures:  08/02/12 2D echocardiogram:  LV EF: 55% -   60%  07/30/12 CT head  IMPRESSION: 1.  No acute intracranial abnormalities. 2.  Mild cerebral and cerebellar atrophy with chronic microvascular ischemic changes of the cerebral white matter, similar to the prior Study.  08/01/12 MRI brain:  IMPRESSION: Motion degraded exam.   No acute infarct.   Moderate small vessel disease type changes.   Global atrophy.   09/05/13 MRI head w/o CM:  IMPRESSION:  No acute infarction. Extensive and widespread chronic small-vessel  ischemic changes throughout the brain.   09/09/13 X-ray Pelvis  IMPRESSION:  No acute abnormality. Moderately severe arthritis of the right hip  with chondrocalcinosis.  09/09/13 CXR  IMPRESSION:  1. Mild elevation of the right hemidiaphragm is new compared to the  prior study, but is of uncertain the etiology and significance.  09/09/13 CT head w/o contrast:  IMPRESSION:  No acute intracranial abnormality. Chronic atrophy and chronic small  vessel ischemic disease with tiny lacunar infarcts, stable.  09/09/13  Echocardiogram LV EF: 65%  09/25/13   X-ray R ankle, Femur, Knee, Tibia/Fibula: no acute bony abnormalities   09/25/13 [place] RLE: negative for venous thrombosis at major veins of right lower extremity.   Assessment/Plan Balance problems 10/05/13 Berg Balance Assessment 34/56-high risk for falling.  HYPOTHYROIDISM Takes Levothyroxine 62mcg daily except Sunday 23mcg. Last TSH wnl while in hospital.     Subconjunctival hemorrhage of left eye Healing nicely.   MIGRAINE HEADACHE Controlled with Topmax.    ANXIETY Apparently stable on Zoloft 200mg , Wellbutrin 300mg , and Remeron 45mg . Clonopin 0.5mg  qhs and 0.25mg  bid prn.  F/u Dr. Neurology Jannifer Franklin    Chronic constipation Managed with laxatives. Miralax daily prn, Senokot S I bid and II bid prn. Continue Colace  bid. Continue Linzess.     Other testicular hypofunction Supplemented with Testosterone 50mg  topically qd.        Family/ Staff Communication: observe the patient.   Goals of Care: SNF-[place] Index of ADL 10/05/13 showed 10/20-50% deficit.   Labs/tests ordered: nonte.

## 2013-10-05 NOTE — Assessment & Plan Note (Signed)
Apparently stable on Zoloft 200mg , Wellbutrin 300mg , and Remeron 45mg . Clonopin 0.5mg  qhs and 0.25mg  bid prn.  F/u Dr. Neurology Jannifer Franklin

## 2013-10-05 NOTE — Assessment & Plan Note (Signed)
Controlled with Topmax.

## 2013-10-05 NOTE — Assessment & Plan Note (Signed)
Managed with laxatives. Miralax daily prn, Senokot S I bid and II bid prn. Continue Colace  bid. Continue Linzess.

## 2013-10-05 NOTE — Assessment & Plan Note (Signed)
10/05/13 Berg Balance Assessment 34/56-high risk for falling.

## 2013-10-05 NOTE — Assessment & Plan Note (Signed)
Takes Levothyroxine 29mcg daily except Sunday 90mcg. Last TSH wnl while in hospital.

## 2013-10-10 NOTE — Progress Notes (Signed)
Late entry due to missed g code. 10/10/2013 Nestor Lewandowsky, OTR/L Pager: [phone]

## 2013-10-19 ENCOUNTER — Non-Acute Institutional Stay (SKILLED_NURSING_FACILITY): Payer: Medicare Other | Admitting: Nurse Practitioner

## 2013-10-19 ENCOUNTER — Encounter: Payer: Self-pay | Admitting: Nurse Practitioner

## 2013-10-19 DIAGNOSIS — K5900 Constipation, unspecified: Secondary | ICD-10-CM

## 2013-10-19 DIAGNOSIS — R001 Bradycardia, unspecified: Secondary | ICD-10-CM

## 2013-10-19 DIAGNOSIS — F411 Generalized anxiety disorder: Secondary | ICD-10-CM

## 2013-10-19 DIAGNOSIS — E291 Testicular hypofunction: Secondary | ICD-10-CM

## 2013-10-19 DIAGNOSIS — I498 Other specified cardiac arrhythmias: Secondary | ICD-10-CM

## 2013-10-19 DIAGNOSIS — E039 Hypothyroidism, unspecified: Secondary | ICD-10-CM

## 2013-10-19 DIAGNOSIS — K5909 Other constipation: Secondary | ICD-10-CM

## 2013-10-19 DIAGNOSIS — G43909 Migraine, unspecified, not intractable, without status migrainosus: Secondary | ICD-10-CM

## 2013-10-19 DIAGNOSIS — I10 Essential (primary) hypertension: Secondary | ICD-10-CM

## 2013-10-19 NOTE — Progress Notes (Signed)
Patient ID: Jose Leach, male   DOB: [birth year], 80 y.o.   MRN: [id number]    Code Status: Full Code  Allergies  Allergen Reactions  . Latex Rash  . Tape Rash    Paper tape is ok to use     Chief Complaint  Patient presents with  . Medical Management of Chronic Issues  . Acute Visit    heart rate in 40s    HPI: Patient is a 80 y.o. male seen in the SNF at [facility]  today for evaluation of bradycardia, elevated Sbpx1,  and other chronic medical conditions.     ED 09/05/13-evaluate s/p fallen twice in a couple of days at his independent living apartment.  He was started on Septra for possibility of urinary tract infection returned to his independent living apartment. Patient had more falls and was admitted to the skilled nursing facility level of [place] on 09/07/13.   While in the SNF level, he had another fall and was returned to the hospital on 09/09/13. A large right hip hematoma. He had generalized weakness. He was stabilized and returned to the skilled nursing facility at [facility] on 09/11/13.   Problem List Items Addressed This Visit   HYPOTHYROIDISM     Takes Levothyroxine 32mcg daily except Sunday 48mcg. Last TSH wnl while in hospital. Update TSH.        ANXIETY     Apparently stable on Zoloft 200mg , Wellbutrin 300mg , and Remeron 45mg . Clonopin 0.5mg  qhs and 0.25mg  bid prn.  F/u Dr. Neurology Jannifer Franklin       MIGRAINE HEADACHE     Controlled with Topmax.       Chronic constipation     Managed with laxatives. Miralax daily prn, Senokot S I bid and II bid prn. Continue Colace  bid. Continue Linzess.       Bradycardia, sinus - Primary     Not new. Will f/u EKG. F/u Cardiology.     HTN (hypertension)     Elevated Sbpx1-VS q shift x 1week. Update CBC and CMP    Other testicular hypofunction     Supplemented with Testosterone 50mg  topically qd.            Review of Systems:  Review of Systems  Constitutional: Negative for  fever, chills, weight loss, malaise/fatigue and diaphoresis.       Barthel index of ADL 10/05/13 10/20-50% deficit  HENT: Positive for hearing loss. Negative for congestion, ear discharge, ear pain, nosebleeds, sore throat and tinnitus.   Eyes: Positive for redness. Negative for blurred vision, double vision, photophobia, pain and discharge.       Resolving.   Respiratory: Negative for cough, hemoptysis, sputum production, shortness of breath, wheezing and stridor.   Cardiovascular: Positive for PND. Negative for chest pain, palpitations, orthopnea, claudication and leg swelling.  Gastrointestinal: Negative for heartburn, nausea, vomiting, abdominal pain, diarrhea, constipation, blood in stool and melena.  Genitourinary: Negative for dysuria, urgency, frequency, hematuria and flank pain.       Incontinent of B+B frequently.   Musculoskeletal: Positive for back pain, falls, joint pain and neck pain. Negative for myalgias.       10/05/13 Berg Balance Assessment: 34/56-hight risk for falling.   Skin: Negative for itching and rash.       R hip hematoma-smaller-about a ping pong ball size now. Sacral coccygeal area beefy redness and superficial skin missing spots-improving. Swelling, bruise, mild heat, and pain in the 1/3 lower R thigh,  knee, and 1/3 upper R lower leg-resolving.     Neurological: Positive for sensory change, speech change (low voice), weakness and headaches (treated). Negative for dizziness, tingling, tremors, focal weakness, seizures and loss of consciousness.  Endo/Heme/Allergies: Negative for environmental allergies and polydipsia. Does not bruise/bleed easily.  Psychiatric/Behavioral: Positive for depression and memory loss. Negative for suicidal ideas, hallucinations and substance abuse. The patient is nervous/anxious. The patient does not have insomnia.      Past Medical History  Diagnosis Date  . Migraines     Dr Jannifer Franklin  . Peripheral neuropathy   . Benign essential tremor    . Cervical stenosis of spine   . Anemia     B12 deficiency  . Testosterone deficiency     Dr Lawerance Bach, [facility]  . Hypothyroidism   . Hyperlipidemia   . Depression     Dr Albertine Patricia  . History of shingles 2009  . H/O cardiovascular stress test     a. 02/2003 -> no ischemia/infarct.  Marland Kitchen Hx of echocardiogram     Echocardiogram 08/01/12: Mild LVH, EF 55-60%, normal wall motion.  Marland Kitchen PAD (peripheral artery disease)   . Carotid stenosis     s/p L CEA  . Fecal impaction 10/11/2012  . Macular edema   . Cervical spinal stenosis   . Hematoma of leg   . Acute urinary retention 01/28/2013  . AKI (acute kidney injury) 01/26/2013  . Altered mental status 09/09/2013  . ANXIETY 06/07/2007    Qualifier: Diagnosis of  By: Talbert Cage CMA ([place]), June    . ARTHRITIS 06/07/2007    Qualifier: Diagnosis of  By: Talbert Cage CMA ([place]), June    . Balance problems 04/05/2011  . Benign fibroma of prostate 10/11/2011  . Bilateral inguinal hernia 10/09/2012  . Bilateral leg edema 09/13/2012  . Bladder retention 02/05/2013  . Bradycardia, sinus 08/02/2012  . 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 CAROTID ARTERY DISEASE 03/31/2007    Qualifier: Diagnosis of  By: Linna Darner MD, Gwyndolyn Saxon    . Degeneration macular 11/02/2011  . Difficulty in walking(719.7) 04/05/2011  . ESOPHAGITIS 08/06/2002    Qualifier: Diagnosis of  By: Talbert Cage CMA ([place]), June    . Essential and other specified forms of tremor 04/18/2013  . Family history of colon cancer 07/24/2012  . Femoral hernia, bilateral s/p lap repair 01/16/2013 01/16/2013  . HAMMER TOE 04/23/2010    Qualifier: Diagnosis of  By: Linna Darner MD, Gwyndolyn Saxon    . Hip hematoma, right 09/09/2013  . HTN (hypertension) 08/22/2012  . Hydrocele 10/11/2011  . 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 MORTON'S NEUROMA, LEFT 08/22/2007    Qualifier: Diagnosis of  By: Linna Darner MD, Gwyndolyn Saxon    . Personal history of colonic polyps 01/05/2013    2014 2 mm adenomatous polyp   . PREMATURE ATRIAL CONTRACTIONS 03/14/2006     Qualifier: Diagnosis of  By: Linna Darner MD, Gwendolyn Lima 12/16/2011  . SPINAL STENOSIS 03/14/2006    Qualifier: Diagnosis of  By: Linna Darner MD, William   Cervical spine    . Unspecified vitamin D deficiency 08/06/2012  . Urge incontinence 10/09/2012  . Urinary tract infection, site not specified 02/13/2013  . Weakness 08/02/2012   Past Surgical History  Procedure Laterality Date  . Appendectomy  1941  . Lumbar laminectomy    . Carotid endarterectomy  2005     L  . Tonsillectomy and adenoidectomy    . Total hip arthroplasty  2005    L  . Cataract extraction Right   .  Eye surgery Right 02/2012    retina  . Inguinal hernia repair Bilateral 01/16/2013    Procedure: LAPAROSCOPIC BILATERAL INGUINAL DIRECT AND INDIRECT, FEMORAL, AND OBTURATOR HERNIAS;  Surgeon: Adin Hector, MD;  Location: [place];  Service: General;  Laterality: Bilateral;  . Insertion of mesh N/A 01/16/2013    Procedure: INSERTION OF MESH;  Surgeon: Adin Hector, MD;  Location: [place];  Service: General;  Laterality: N/A;  . Breast cyst excision Left 01/16/2013    Procedure: MASS EXCISION AXILLA;  Surgeon: Adin Hector, MD;  Location: [place];  Service: General;  Laterality: Left;   Social History:   reports that he quit smoking about 34 years ago. His smoking use included Pipe and Cigars. He has never used smokeless tobacco. He reports that he drinks about 4.2 ounces of alcohol per week. He reports that he does not use illicit drugs.  Family History  Problem Relation Age of Onset  . Stroke Mother 26  . Hypertension Mother   . Diabetes Mother   . Heart disease Mother   . Rectal cancer Father 16  . Heart disease Father     in 11s  . Cancer Father     colorectal  . Nephritis Sister   . Seizures Brother     died as child    Medications: Patient's Medications  New Prescriptions   No medications on file  Previous Medications   ACETAMINOPHEN (TYLENOL) 325 MG TABLET    Take 2 tablets (650 mg total) by mouth  every 6 (six) hours as needed for mild pain (or Fever >/= 101).   ASPIRIN 81 MG TABLET    Take 81 mg by mouth every morning.    ATORVASTATIN (LIPITOR) 20 MG TABLET    Take one tablet by mouth once daily in the evening for cholesterol   B COMPLEX-C (B-COMPLEX WITH VITAMIN C) TABLET    Take 1 tablet by mouth every morning.   BIMATOPROST (LUMIGAN) 0.01 % SOLN    Place 1 drop into both eyes at bedtime.   BRIMONIDINE (ALPHAGAN) 0.2 % OPHTHALMIC SOLUTION    Place one drop into the right eye two times daily   BUPROPION (WELLBUTRIN XL) 300 MG 24 HR TABLET    Take 300 mg by mouth every morning.    CHOLECALCIFEROL (VITAMIN D) 1000 UNITS TABLET    Take 2,000 Units by mouth 2 (two) times daily.    CLONAZEPAM (KLONOPIN) 0.5 MG TABLET    Take 0.5 tablets (0.25 mg total) by mouth 2 (two) times daily as needed (anxiety, agitation).   CLONAZEPAM (KLONOPIN) 0.5 MG TABLET    Take 1 tablet (0.5 mg total) by mouth at bedtime.   DOCUSATE SODIUM 100 MG CAPS    Take 100 mg by mouth 2 (two) times daily.   FLAXSEED, LINSEED, (FLAX SEEDS PO)    Take 1 tablet by mouth every morning.    HYDROCODONE-ACETAMINOPHEN (NORCO/VICODIN) 5-325 MG PER TABLET    Take 1 tablet by mouth every 4 (four) hours as needed for moderate pain.   KRILL OIL 500 MG CAPS    Take 500 mg by mouth every morning.    L-METHYLFOLATE 7.5 MG TABS    Take 7.5 mg by mouth daily.   LACTOSE FREE NUTRITION (BOOST PLUS) LIQD    Take 237 mLs by mouth 3 (three) times daily with meals.   LEVOTHYROXINE (SYNTHROID, LEVOTHROID) 50 MCG TABLET    Take one tablet by mouth 30 minutes before breakfast on empty  stomach once daily except on Sunday take 1/2 tablet for thyroid   LINACLOTIDE (LINZESS) 290 MCG CAPS CAPSULE    Take 290 mcg by mouth daily.   MIRTAZAPINE (REMERON) 45 MG TABLET    Take 45 mg by mouth at bedtime.   MULTIPLE VITAMINS-MINERALS (ICAPS AREDS FORMULA PO)    Take 1 capsule by mouth daily.    POLYETHYLENE GLYCOL (MIRALAX / GLYCOLAX) PACKET    Take 17 g by  mouth daily as needed for mild constipation.   SENNOSIDES-DOCUSATE SODIUM (SENOKOT-S) 8.6-50 MG TABLET    Take 2 tablets by mouth 2 (two) times daily as needed.    SERTRALINE (ZOLOFT) 100 MG TABLET    Take two tablets by mouth once daily in the evening   TESTOSTERONE ([name]) 50 MG/5GM GEL    Place 5 g onto the skin every morning.    TOPIRAMATE (TOPAMAX) 25 MG TABLET    Take 75 mg by mouth daily.   VITAMIN B-12 (CYANOCOBALAMIN) 1000 MCG TABLET    Take 1,000 mcg by mouth every morning.   Modified Medications   No medications on file  Discontinued Medications   No medications on file     Physical Exam: Physical Exam  Constitutional: He is oriented to person, place, and time. He appears well-developed and well-nourished. No distress.  HENT:  Head: Normocephalic and atraumatic.  Right Ear: External ear normal.  Left Ear: External ear normal.  Nose: Nose normal.  Mouth/Throat: Oropharynx is clear and moist. No oropharyngeal exudate.  Eyes: Conjunctivae and EOM are normal. Pupils are equal, round, and reactive to light. Right eye exhibits no discharge. Left eye exhibits no discharge. No scleral icterus.  R+L eyes: injected, no discharge, denied itching or pain. R subconjunctival hemorrhage-healing nicely.   Neck: Normal range of motion. Neck supple. No JVD present. No tracheal deviation present. No thyromegaly present.  Cardiovascular: Normal rate, regular rhythm, normal heart sounds and intact distal pulses.   No murmur heard. Pulmonary/Chest: Effort normal and breath sounds normal. No stridor. No respiratory distress. He has no wheezes. He has no rales. He exhibits no tenderness.  Abdominal: Soft. Bowel sounds are normal. He exhibits no distension. There is no tenderness. There is no rebound and no guarding.  Genitourinary:  A small external hemorrhoid 5pm  Musculoskeletal: Normal range of motion. He exhibits tenderness. He exhibits no edema.  Pain in his buttocks-feels like  hemorrhoids-there is a small external  Hemorrhoid 5pm w/o bleeding-relieves with Hydrocodone/ApAp. Sacral coccygeal area beefy redness and superficial skin missing spots   Lymphadenopathy:    He has no cervical adenopathy.  Neurological: He is alert and oriented to person, place, and time. He has normal reflexes. No cranial nerve deficit. He exhibits normal muscle tone. Coordination normal.  Skin: Skin is warm and dry. No rash noted. He is not diaphoretic. No erythema. No pallor.  R hip hematoma-smaller-about a ping pong ball size now. Sacral coccygeal area beefy redness and superficial skin missing spots-improving. Swelling, bruise, mild heat, and pain in the 1/3 lower R thigh, knee, and 1/3 upper R lower leg-resolving.      Psychiatric: His mood appears anxious. His affect is not angry, not blunt, not labile and not inappropriate. His speech is not rapid and/or pressured, not delayed, not tangential and not slurred. He is not agitated, not aggressive, not hyperactive, not slowed, not withdrawn, not actively hallucinating and not combative. Thought content is not paranoid and not delusional. Cognition and memory are impaired. He does not express  impulsivity or inappropriate judgment. He exhibits a depressed mood. He expresses no homicidal and no suicidal ideation. He expresses no suicidal plans and no homicidal plans. He is communicative. He exhibits abnormal recent memory. He exhibits normal remote memory. He is attentive.    Filed Vitals:   10/19/13 1424  BP: 146/84  Pulse: 46  Temp: 98.2 F (36.8 C)  TempSrc: Tympanic  Resp: 16      Labs reviewed: Basic Metabolic Panel:  Recent Labs  01/26/13 2210  09/07/13 1209 09/09/13 1807 09/10/13 0139  NA  --   < > 140 141 139  K  --   < > 4.2 4.7 4.8  CL  --   < > 105 107 107  CO2  --   < > 25 20 20   GLUCOSE  --   < > 85 79 85  BUN  --   < > 38* 38* 34*  CREATININE  --   < > 1.40* 1.29 1.17  CALCIUM  --   < > 9.0 8.5 8.3*  MG  --    --   --   --  1.7  PHOS  --   --   --   --  2.7  TSH 2.756  --   --   --  4.390  < > = values in this interval not displayed. Liver Function Tests:  Recent Labs  09/07/13 1209 09/09/13 1807 09/10/13 0139  AST 29 29 23   ALT 26 31 27   ALKPHOS 53 53 48  BILITOT 0.3 <0.2* 0.2*  PROT 6.4 6.1 5.9*  ALBUMIN 3.2* 3.0* 2.8*    Recent Labs  01/26/13 1900  LIPASE 15  CBC:  Recent Labs  01/26/13 1855 09/05/13 1206 09/07/13 1209 09/09/13 1807 09/10/13 0139  WBC 6.6 6.3 5.0 5.7 7.3  NEUTROABS 4.9 4.2 3.3  --   --   HGB 11.4* 15.0 14.2 13.8 13.2  HCT 35.2* 46.3 44.5 42.6 41.1  MCV 91.2 93.0 92.5 93.4 92.2  PLT 196 163 152 165 165   No results found for this basename: FOLATE, IRON, VITAMINB12,  in the last 8760 hours  Past Procedures:  08/02/12 2D echocardiogram:  LV EF: 55% -   60%  07/30/12 CT head  IMPRESSION: 1.  No acute intracranial abnormalities. 2.  Mild cerebral and cerebellar atrophy with chronic microvascular ischemic changes of the cerebral white matter, similar to the prior Study.  08/01/12 MRI brain:  IMPRESSION: Motion degraded exam.   No acute infarct.   Moderate small vessel disease type changes.   Global atrophy.   09/05/13 MRI head w/o CM:  IMPRESSION:  No acute infarction. Extensive and widespread chronic small-vessel  ischemic changes throughout the brain.   09/09/13 X-ray Pelvis  IMPRESSION:  No acute abnormality. Moderately severe arthritis of the right hip  with chondrocalcinosis.  09/09/13 CXR  IMPRESSION:  1. Mild elevation of the right hemidiaphragm is new compared to the  prior study, but is of uncertain the etiology and significance.  09/09/13 CT head w/o contrast:  IMPRESSION:  No acute intracranial abnormality. Chronic atrophy and chronic small  vessel ischemic disease with tiny lacunar infarcts, stable.  09/09/13 Echocardiogram LV EF: 65%  09/25/13   X-ray R ankle, Femur, Knee, Tibia/Fibula: no acute bony  abnormalities   09/25/13 [place] RLE: negative for venous thrombosis at major veins of right lower extremity.   Assessment/Plan Bradycardia, sinus Not new. Will f/u EKG. F/u Cardiology.   HYPOTHYROIDISM Takes Levothyroxine 43mcg daily except  Sunday 94mcg. Last TSH wnl while in hospital. Update TSH.      ANXIETY Apparently stable on Zoloft 200mg , Wellbutrin 300mg , and Remeron 45mg . Clonopin 0.5mg  qhs and 0.25mg  bid prn.  F/u Dr. Neurology Jannifer Franklin     Chronic constipation Managed with laxatives. Miralax daily prn, Senokot S I bid and II bid prn. Continue Colace  bid. Continue Linzess.     Other testicular hypofunction Supplemented with Testosterone 50mg  topically qd.       MIGRAINE HEADACHE Controlled with Topmax.     HTN (hypertension) Elevated Sbpx1-VS q shift x 1week. Update CBC and CMP    Family/ Staff Communication: observe the patient.   Goals of Care: SN[facility]-[place] Index of ADL 10/05/13 showed 10/20-50% deficit.   Labs/tests ordered: CBC, CMP, TSH, EKG

## 2013-10-19 NOTE — Assessment & Plan Note (Signed)
Not new. Will f/u EKG. F/u Cardiology.

## 2013-10-23 ENCOUNTER — Encounter: Payer: Self-pay | Admitting: Nurse Practitioner

## 2013-10-23 ENCOUNTER — Non-Acute Institutional Stay (SKILLED_NURSING_FACILITY): Payer: Medicare Other | Admitting: Nurse Practitioner

## 2013-10-23 DIAGNOSIS — G43909 Migraine, unspecified, not intractable, without status migrainosus: Secondary | ICD-10-CM

## 2013-10-23 DIAGNOSIS — H109 Unspecified conjunctivitis: Secondary | ICD-10-CM

## 2013-10-23 DIAGNOSIS — F411 Generalized anxiety disorder: Secondary | ICD-10-CM

## 2013-10-23 DIAGNOSIS — E291 Testicular hypofunction: Secondary | ICD-10-CM

## 2013-10-23 DIAGNOSIS — N433 Hydrocele, unspecified: Secondary | ICD-10-CM

## 2013-10-23 DIAGNOSIS — K5900 Constipation, unspecified: Secondary | ICD-10-CM

## 2013-10-23 DIAGNOSIS — I10 Essential (primary) hypertension: Secondary | ICD-10-CM

## 2013-10-23 DIAGNOSIS — K5909 Other constipation: Secondary | ICD-10-CM

## 2013-10-23 DIAGNOSIS — E039 Hypothyroidism, unspecified: Secondary | ICD-10-CM

## 2013-10-23 NOTE — Assessment & Plan Note (Signed)
Apparently stable on Zoloft 200mg , Wellbutrin 300mg , and Remeron 45mg . Clonopin 0.5mg  qhs and 0.25mg  bid prn.  F/u Dr. Neurology Jannifer Franklin

## 2013-10-23 NOTE — Assessment & Plan Note (Signed)
R+L. Also noted tenderness when testicles palpated. No heat or skin breakdown. Will obtain US bladder and testicles. Urine is clear. No dysuria. Weak stream and mild hesitancy as usual.

## 2013-10-23 NOTE — Assessment & Plan Note (Signed)
Managed with laxatives. Miralax daily prn, Senokot S I bid and II bid prn. Continue Colace  bid. Continue Linzess.

## 2013-10-23 NOTE — Assessment & Plan Note (Signed)
Resolved

## 2013-10-23 NOTE — Assessment & Plan Note (Signed)
Supplemented with Testosterone 50mg  topically qd.

## 2013-10-23 NOTE — Assessment & Plan Note (Addendum)
Elevated Sbpx1-VS q shift x 1week. Update CBC and CMP

## 2013-10-23 NOTE — Assessment & Plan Note (Signed)
Controlled with Topmax.

## 2013-10-23 NOTE — Assessment & Plan Note (Signed)
Takes Levothyroxine 10mcg daily except Sunday 24mcg. Last TSH wnl while in hospital. Update TSH pending.

## 2013-10-23 NOTE — Assessment & Plan Note (Signed)
Elevated Sbpx1-VS q shift x 1week. Bp 148/74 10/22/13

## 2013-10-23 NOTE — Assessment & Plan Note (Signed)
Enlarged scrotum with tenderness when testicles palpated. Will obtain US bladder and testicles. UA C/S

## 2013-10-23 NOTE — Progress Notes (Signed)
Patient ID: Jose Leach, male   DOB: 03/30/1932, 80 y.o.   MRN: [id number]    Code Status: Full Code  Allergies  Allergen Reactions  . Latex Rash  . Tape Rash    Paper tape is ok to use     Chief Complaint  Patient presents with  . Medical Management of Chronic Issues  . Acute Visit    testicle pain and swelling.     HPI: Patient is a 80 y.o. male seen in the SNF at [facility]  today for evaluation of testicles swelling/tenderness and other chronic medical conditions.     ED 09/05/13-evaluate s/p fallen twice in a couple of days at his independent living apartment.  He was started on Septra for possibility of urinary tract infection returned to his independent living apartment. Patient had more falls and was admitted to the skilled nursing facility level of [facility] on 09/07/13.   While in the SNF level, he had another fall and was returned to the hospital on 09/09/13. A large right hip hematoma. He had generalized weakness. He was stabilized and returned to the skilled nursing facility at [facility] on 09/11/13.   Problem List Items Addressed This Visit   HYPOTHYROIDISM     Takes Levothyroxine 53mcg daily except Sunday 44mcg. Last TSH wnl while in hospital. Update TSH pending.     ANXIETY     Apparently stable on Zoloft 200mg , Wellbutrin 300mg , and Remeron 45mg . Clonopin 0.5mg  qhs and 0.25mg  bid prn.  F/u Dr. Neurology Jannifer Franklin     MIGRAINE HEADACHE     Controlled with Topmax.       Chronic constipation     Managed with laxatives. Miralax daily prn, Senokot S I bid and II bid prn. Continue Colace  bid. Continue Linzess.        HTN (hypertension)     Elevated Sbpx1-VS q shift x 1week. Bp 148/74 10/22/13     Hydrocele     Enlarged scrotum with tenderness when testicles palpated. Will obtain US bladder and testicles. UA C/S    Other testicular hypofunction     Supplemented with Testosterone 50mg  topically qd.        Conjunctivitis   Resolved.     Hydrocele in adult - Primary     R+L. Also noted tenderness when testicles palpated. No heat or skin breakdown. Will obtain US bladder and testicles. Urine is clear. No dysuria. Weak stream and mild hesitancy as usual.        Review of Systems:  Review of Systems  Constitutional: Negative for fever, chills, weight loss, malaise/fatigue and diaphoresis.       Barthel index of ADL 10/05/13 10/20-50% deficit  HENT: Positive for hearing loss. Negative for congestion, ear discharge, ear pain, nosebleeds, sore throat and tinnitus.   Eyes: Positive for redness. Negative for blurred vision, double vision, photophobia, pain and discharge.       Resolving.   Respiratory: Negative for cough, hemoptysis, sputum production, shortness of breath, wheezing and stridor.   Cardiovascular: Positive for PND. Negative for chest pain, palpitations, orthopnea, claudication and leg swelling.  Gastrointestinal: Negative for heartburn, nausea, vomiting, abdominal pain, diarrhea, constipation, blood in stool and melena.  Genitourinary: Negative for dysuria, urgency, frequency, hematuria and flank pain.       Incontinent of B+B frequently. Enlarged scrotum and tender testicles.   Musculoskeletal: Positive for back pain, falls, joint pain and neck pain. Negative for myalgias.  10/05/13 Berg Balance Assessment: 34/56-hight risk for falling.   Skin: Negative for itching and rash.       R hip hematoma-smaller-near resolution Sacral coccygeal area beefy redness and superficial skin missing spots-improving. Swelling, bruise, mild heat, and pain in the 1/3 lower R thigh, knee, and 1/3 upper R lower leg-resolving.     Neurological: Positive for sensory change, speech change (low voice), weakness and headaches (treated). Negative for dizziness, tingling, tremors, focal weakness, seizures and loss of consciousness.  Endo/Heme/Allergies: Negative for environmental allergies and polydipsia. Does not bruise/bleed  easily.  Psychiatric/Behavioral: Positive for depression and memory loss. Negative for suicidal ideas, hallucinations and substance abuse. The patient is nervous/anxious. The patient does not have insomnia.      Past Medical History  Diagnosis Date  . Migraines     Dr Jannifer Franklin  . Peripheral neuropathy   . Benign essential tremor   . Cervical stenosis of spine   . Anemia     B12 deficiency  . Testosterone deficiency     Dr Lawerance Bach, [facility]  . Hypothyroidism   . Hyperlipidemia   . Depression     Dr Albertine Patricia  . History of shingles 2009  . H/O cardiovascular stress test     a. 02/2003 -> no ischemia/infarct.  Marland Kitchen Hx of echocardiogram     Echocardiogram 08/01/12: Mild LVH, EF 55-60%, normal wall motion.  Marland Kitchen PAD (peripheral artery disease)   . Carotid stenosis     s/p L CEA  . Fecal impaction 10/11/2012  . Macular edema   . Cervical spinal stenosis   . Hematoma of leg   . Acute urinary retention 01/28/2013  . AKI (acute kidney injury) 01/26/2013  . Altered mental status 09/09/2013  . ANXIETY 06/07/2007    Qualifier: Diagnosis of  By: Talbert Cage CMA ([place]), June    . ARTHRITIS 06/07/2007    Qualifier: Diagnosis of  By: Talbert Cage CMA ([place]), June    . Balance problems 04/05/2011  . Benign fibroma of prostate 10/11/2011  . Bilateral inguinal hernia 10/09/2012  . Bilateral leg edema 09/13/2012  . Bladder retention 02/05/2013  . Bradycardia, sinus 08/02/2012  . 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 CAROTID ARTERY DISEASE 03/31/2007    Qualifier: Diagnosis of  By: Linna Darner MD, Gwyndolyn Saxon    . Degeneration macular 11/02/2011  . Difficulty in walking(719.7) 04/05/2011  . ESOPHAGITIS 08/06/2002    Qualifier: Diagnosis of  By: Talbert Cage CMA ([place]), June    . Essential and other specified forms of tremor 04/18/2013  . Family history of colon cancer 07/24/2012  . Femoral hernia, bilateral s/p lap repair 01/16/2013 01/16/2013  . HAMMER TOE 04/23/2010    Qualifier: Diagnosis of  By: Linna Darner MD, Gwyndolyn Saxon    . Hip  hematoma, right 09/09/2013  . HTN (hypertension) 08/22/2012  . Hydrocele 10/11/2011  . 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 MORTON'S NEUROMA, LEFT 08/22/2007    Qualifier: Diagnosis of  By: Linna Darner MD, Gwyndolyn Saxon    . Personal history of colonic polyps 01/05/2013    2014 2 mm adenomatous polyp   . PREMATURE ATRIAL CONTRACTIONS 03/14/2006    Qualifier: Diagnosis of  By: Linna Darner MD, Gwendolyn Lima 12/16/2011  . SPINAL STENOSIS 03/14/2006    Qualifier: Diagnosis of  By: Linna Darner MD, William   Cervical spine    . Unspecified vitamin D deficiency 08/06/2012  . Urge incontinence 10/09/2012  . Urinary tract infection, site not specified 02/13/2013  . Weakness 08/02/2012   Past Surgical History  Procedure  Laterality Date  . Appendectomy  1941  . Lumbar laminectomy    . Carotid endarterectomy  2005     L  . Tonsillectomy and adenoidectomy    . Total hip arthroplasty  2005    L  . Cataract extraction Right   . Eye surgery Right 02/2012    retina  . Inguinal hernia repair Bilateral 01/16/2013    Procedure: LAPAROSCOPIC BILATERAL INGUINAL DIRECT AND INDIRECT, FEMORAL, AND OBTURATOR HERNIAS;  Surgeon: Adin Hector, MD;  Location: [place];  Service: General;  Laterality: Bilateral;  . Insertion of mesh N/A 01/16/2013    Procedure: INSERTION OF MESH;  Surgeon: Adin Hector, MD;  Location: [place];  Service: General;  Laterality: N/A;  . Breast cyst excision Left 01/16/2013    Procedure: MASS EXCISION AXILLA;  Surgeon: Adin Hector, MD;  Location: [place];  Service: General;  Laterality: Left;   Social History:   reports that he quit smoking about 34 years ago. His smoking use included Pipe and Cigars. He has never used smokeless tobacco. He reports that he drinks about 4.2 ounces of alcohol per week. He reports that he does not use illicit drugs.  Family History  Problem Relation Age of Onset  . Stroke Mother 5  . Hypertension Mother   . Diabetes Mother   . Heart disease Mother   . Rectal  cancer Father 73  . Heart disease Father     in 57s  . Cancer Father     colorectal  . Nephritis Sister   . Seizures Brother     died as child    Medications: Patient's Medications  New Prescriptions   No medications on file  Previous Medications   ACETAMINOPHEN (TYLENOL) 325 MG TABLET    Take 2 tablets (650 mg total) by mouth every 6 (six) hours as needed for mild pain (or Fever >/= 101).   ASPIRIN 81 MG TABLET    Take 81 mg by mouth every morning.    ATORVASTATIN (LIPITOR) 20 MG TABLET    Take one tablet by mouth once daily in the evening for cholesterol   B COMPLEX-C (B-COMPLEX WITH VITAMIN C) TABLET    Take 1 tablet by mouth every morning.   BIMATOPROST (LUMIGAN) 0.01 % SOLN    Place 1 drop into both eyes at bedtime.   BRIMONIDINE (ALPHAGAN) 0.2 % OPHTHALMIC SOLUTION    Place one drop into the right eye two times daily   BUPROPION (WELLBUTRIN XL) 300 MG 24 HR TABLET    Take 300 mg by mouth every morning.    CHOLECALCIFEROL (VITAMIN D) 1000 UNITS TABLET    Take 2,000 Units by mouth 2 (two) times daily.    CLONAZEPAM (KLONOPIN) 0.5 MG TABLET    Take 0.5 tablets (0.25 mg total) by mouth 2 (two) times daily as needed (anxiety, agitation).   CLONAZEPAM (KLONOPIN) 0.5 MG TABLET    Take 1 tablet (0.5 mg total) by mouth at bedtime.   DOCUSATE SODIUM 100 MG CAPS    Take 100 mg by mouth 2 (two) times daily.   FLAXSEED, LINSEED, (FLAX SEEDS PO)    Take 1 tablet by mouth every morning.    HYDROCODONE-ACETAMINOPHEN (NORCO/VICODIN) 5-325 MG PER TABLET    Take 1 tablet by mouth every 4 (four) hours as needed for moderate pain.   KRILL OIL 500 MG CAPS    Take 500 mg by mouth every morning.    L-METHYLFOLATE 7.5 MG TABS  Take 7.5 mg by mouth daily.   LACTOSE FREE NUTRITION (BOOST PLUS) LIQD    Take 237 mLs by mouth 3 (three) times daily with meals.   LEVOTHYROXINE (SYNTHROID, LEVOTHROID) 50 MCG TABLET    Take one tablet by mouth 30 minutes before breakfast on empty stomach once daily except on  Sunday take 1/2 tablet for thyroid   LINACLOTIDE (LINZESS) 290 MCG CAPS CAPSULE    Take 290 mcg by mouth daily.   MIRTAZAPINE (REMERON) 45 MG TABLET    Take 45 mg by mouth at bedtime.   MULTIPLE VITAMINS-MINERALS (ICAPS AREDS FORMULA PO)    Take 1 capsule by mouth daily.    POLYETHYLENE GLYCOL (MIRALAX / GLYCOLAX) PACKET    Take 17 g by mouth daily as needed for mild constipation.   SENNOSIDES-DOCUSATE SODIUM (SENOKOT-S) 8.6-50 MG TABLET    Take 2 tablets by mouth 2 (two) times daily as needed.    SERTRALINE (ZOLOFT) 100 MG TABLET    Take two tablets by mouth once daily in the evening   TESTOSTERONE ([name]) 50 MG/5GM GEL    Place 5 g onto the skin every morning.    TOPIRAMATE (TOPAMAX) 25 MG TABLET    Take 75 mg by mouth daily.   VITAMIN B-12 (CYANOCOBALAMIN) 1000 MCG TABLET    Take 1,000 mcg by mouth every morning.   Modified Medications   No medications on file  Discontinued Medications   No medications on file     Physical Exam: Physical Exam  Constitutional: He is oriented to person, place, and time. He appears well-developed and well-nourished. No distress.  HENT:  Head: Normocephalic and atraumatic.  Right Ear: External ear normal.  Left Ear: External ear normal.  Nose: Nose normal.  Mouth/Throat: Oropharynx is clear and moist. No oropharyngeal exudate.  Eyes: Conjunctivae and EOM are normal. Pupils are equal, round, and reactive to light. Right eye exhibits no discharge. Left eye exhibits no discharge. No scleral icterus.  R+L eyes: injected, no discharge, denied itching or pain. R subconjunctival hemorrhage-healing nicely.   Neck: Normal range of motion. Neck supple. No JVD present. No tracheal deviation present. No thyromegaly present.  Cardiovascular: Normal rate, regular rhythm, normal heart sounds and intact distal pulses.   No murmur heard. Pulmonary/Chest: Effort normal and breath sounds normal. No stridor. No respiratory distress. He has no wheezes. He has no rales. He  exhibits no tenderness.  Abdominal: Soft. Bowel sounds are normal. He exhibits no distension. There is no tenderness. There is no rebound and no guarding.  Genitourinary:  A small external hemorrhoid 5pm. Incontinent of B+B frequently. Enlarged scrotum and tender testicles.    Musculoskeletal: Normal range of motion. He exhibits tenderness. He exhibits no edema.  Pain in his buttocks-feels like hemorrhoids-there is a small external  Hemorrhoid 5pm w/o bleeding-relieves with Hydrocodone/ApAp. Sacral coccygeal area beefy redness and superficial skin missing spots   Lymphadenopathy:    He has no cervical adenopathy.  Neurological: He is alert and oriented to person, place, and time. He has normal reflexes. No cranial nerve deficit. He exhibits normal muscle tone. Coordination normal.  Skin: Skin is warm and dry. No rash noted. He is not diaphoretic. No erythema. No pallor.  R hip hematoma-smaller-near resolution Sacral coccygeal area beefy redness and superficial skin missing spots-improving. Swelling, bruise, mild heat, and pain in the 1/3 lower R thigh, knee, and 1/3 upper R lower leg-resolving.  Psychiatric: His mood appears anxious. His affect is not angry, not blunt, not labile  and not inappropriate. His speech is not rapid and/or pressured, not delayed, not tangential and not slurred. He is not agitated, not aggressive, not hyperactive, not slowed, not withdrawn, not actively hallucinating and not combative. Thought content is not paranoid and not delusional. Cognition and memory are impaired. He does not express impulsivity or inappropriate judgment. He exhibits a depressed mood. He expresses no homicidal and no suicidal ideation. He expresses no suicidal plans and no homicidal plans. He is communicative. He exhibits abnormal recent memory. He exhibits normal remote memory. He is attentive.    Filed Vitals:   10/23/13 1305  BP: 148/74  Pulse: 50  Temp: 98.5 F (36.9 C)  TempSrc: Tympanic    Resp: 20      Labs reviewed: Basic Metabolic Panel:  Recent Labs  01/26/13 2210  09/07/13 1209 09/09/13 1807 09/10/13 0139  NA  --   < > 140 141 139  K  --   < > 4.2 4.7 4.8  CL  --   < > 105 107 107  CO2  --   < > 25 20 20   GLUCOSE  --   < > 85 79 85  BUN  --   < > 38* 38* 34*  CREATININE  --   < > 1.40* 1.29 1.17  CALCIUM  --   < > 9.0 8.5 8.3*  MG  --   --   --   --  1.7  PHOS  --   --   --   --  2.7  TSH 2.756  --   --   --  4.390  < > = values in this interval not displayed. Liver Function Tests:  Recent Labs  09/07/13 1209 09/09/13 1807 09/10/13 0139  AST 29 29 23   ALT 26 31 27   ALKPHOS 53 53 48  BILITOT 0.3 <0.2* 0.2*  PROT 6.4 6.1 5.9*  ALBUMIN 3.2* 3.0* 2.8*    Recent Labs  01/26/13 1900  LIPASE 15  CBC:  Recent Labs  01/26/13 1855 09/05/13 1206 09/07/13 1209 09/09/13 1807 09/10/13 0139  WBC 6.6 6.3 5.0 5.7 7.3  NEUTROABS 4.9 4.2 3.3  --   --   HGB 11.4* 15.0 14.2 13.8 13.2  HCT 35.2* 46.3 44.5 42.6 41.1  MCV 91.2 93.0 92.5 93.4 92.2  PLT 196 163 152 165 165   No results found for this basename: FOLATE, IRON, VITAMINB12,  in the last 8760 hours  Past Procedures:  08/02/12 2D echocardiogram:  LV EF: 55% -   60%  07/30/12 CT head  IMPRESSION: 1.  No acute intracranial abnormalities. 2.  Mild cerebral and cerebellar atrophy with chronic microvascular ischemic changes of the cerebral white matter, similar to the prior Study.  08/01/12 MRI brain:  IMPRESSION: Motion degraded exam.   No acute infarct.   Moderate small vessel disease type changes.   Global atrophy.   09/05/13 MRI head w/o CM:  IMPRESSION:  No acute infarction. Extensive and widespread chronic small-vessel  ischemic changes throughout the brain.   09/09/13 X-ray Pelvis  IMPRESSION:  No acute abnormality. Moderately severe arthritis of the right hip  with chondrocalcinosis.  09/09/13 CXR  IMPRESSION:  1. Mild elevation of the right hemidiaphragm is new  compared to the  prior study, but is of uncertain the etiology and significance.  09/09/13 CT head w/o contrast:  IMPRESSION:  No acute intracranial abnormality. Chronic atrophy and chronic small  vessel ischemic disease with tiny lacunar infarcts, stable.  09/09/13 Echocardiogram  LV EF: 65%  09/25/13   X-ray R ankle, Femur, Knee, Tibia/Fibula: no acute bony abnormalities   09/25/13 [place] RLE: negative for venous thrombosis at major veins of right lower extremity.   Assessment/Plan Hydrocele in adult R+L. Also noted tenderness when testicles palpated. No heat or skin breakdown. Will obtain US bladder and testicles. Urine is clear. No dysuria. Weak stream and mild hesitancy as usual.   Conjunctivitis Resolved.   HYPOTHYROIDISM Takes Levothyroxine 80mcg daily except Sunday 1mcg. Last TSH wnl while in hospital. Update TSH pending.   ANXIETY Apparently stable on Zoloft 200mg , Wellbutrin 300mg , and Remeron 45mg . Clonopin 0.5mg  qhs and 0.25mg  bid prn.  F/u Dr. Neurology Jannifer Franklin   MIGRAINE HEADACHE Controlled with Topmax.     Chronic constipation Managed with laxatives. Miralax daily prn, Senokot S I bid and II bid prn. Continue Colace  bid. Continue Linzess.      HTN (hypertension) Elevated Sbpx1-VS q shift x 1week. Bp 148/74 10/22/13   Other testicular hypofunction Supplemented with Testosterone 50mg  topically qd.      Hydrocele Enlarged scrotum with tenderness when testicles palpated. Will obtain US bladder and testicles. UA C/S    Family/ Staff Communication: observe the patient.   Goals of Care: SNF-[place] Index of ADL 10/05/13 showed 10/20-50% deficit.   Labs/tests ordered: US bladder and testicles. UA C/S

## 2013-10-23 NOTE — Assessment & Plan Note (Signed)
Takes Levothyroxine 45mcg daily except Sunday 31mcg. Last TSH wnl while in hospital. Update TSH.

## 2013-10-24 NOTE — Progress Notes (Signed)
PT eval addendum - late entry for G-codes   27-Sep-2013 1029  PT G-Codes **NOT FOR INPATIENT CLASS**  Functional Assessment Tool Used clincal judgement  Functional Limitation Mobility: Walking and moving around  Mobility: Walking and Moving Around Current Status (F4142) CJ  Mobility: Walking and Moving Around Goal Status (L9532) CI    Candy Sledge, PT, DPT [phone]

## 2013-10-25 LAB — CBC AND DIFFERENTIAL
HCT: 51 % (ref 41–53)
Hemoglobin: 16.8 g/dL (ref 13.5–17.5)
Platelets: 117 10*3/uL — AB (ref 150–399)
WBC: 5.1 10^3/mL

## 2013-10-25 LAB — TSH: TSH: 2.77 u[IU]/mL (ref 0.41–5.90)

## 2013-10-25 LAB — HEPATIC FUNCTION PANEL
ALK PHOS: 52 U/L (ref 25–125)
ALT: 14 U/L (ref 10–40)
AST: 20 U/L (ref 14–40)
Bilirubin, Total: 0.6 mg/dL

## 2013-10-25 LAB — BASIC METABOLIC PANEL
BUN: 27 mg/dL — AB (ref 4–21)
CREATININE: 1.3 mg/dL (ref 0.6–1.3)
GLUCOSE: 74 mg/dL
POTASSIUM: 4 mmol/L (ref 3.4–5.3)
SODIUM: 139 mmol/L (ref 137–147)

## 2013-10-26 ENCOUNTER — Other Ambulatory Visit: Payer: Self-pay | Admitting: Nurse Practitioner

## 2013-10-26 ENCOUNTER — Encounter: Payer: Self-pay | Admitting: Nurse Practitioner

## 2013-10-26 DIAGNOSIS — E039 Hypothyroidism, unspecified: Secondary | ICD-10-CM

## 2013-10-26 DIAGNOSIS — F411 Generalized anxiety disorder: Secondary | ICD-10-CM

## 2013-11-30 ENCOUNTER — Other Ambulatory Visit: Payer: Self-pay

## 2013-12-27 ENCOUNTER — Other Ambulatory Visit: Payer: Self-pay | Admitting: Internal Medicine

## 2013-12-27 DIAGNOSIS — N138 Other obstructive and reflux uropathy: Secondary | ICD-10-CM | POA: Insufficient documentation

## 2013-12-27 DIAGNOSIS — N401 Enlarged prostate with lower urinary tract symptoms: Secondary | ICD-10-CM

## 2014-01-08 ENCOUNTER — Encounter: Payer: Self-pay | Admitting: Neurology

## 2014-01-15 ENCOUNTER — Other Ambulatory Visit: Payer: Self-pay | Admitting: Gastroenterology

## 2014-01-17 ENCOUNTER — Encounter: Payer: Self-pay | Admitting: Nurse Practitioner

## 2014-01-18 ENCOUNTER — Encounter: Payer: Self-pay | Admitting: Nurse Practitioner

## 2014-03-21 DIAGNOSIS — Z961 Presence of intraocular lens: Secondary | ICD-10-CM | POA: Insufficient documentation

## 2014-03-21 DIAGNOSIS — IMO0002 Reserved for concepts with insufficient information to code with codable children: Secondary | ICD-10-CM | POA: Insufficient documentation

## 2014-03-21 DIAGNOSIS — H4010X0 Unspecified open-angle glaucoma, stage unspecified: Secondary | ICD-10-CM

## 2014-03-21 DIAGNOSIS — H353290 Exudative age-related macular degeneration, unspecified eye, stage unspecified: Secondary | ICD-10-CM | POA: Insufficient documentation

## 2014-03-21 HISTORY — DX: Unspecified open-angle glaucoma, stage unspecified: H40.10X0

## 2014-03-21 HISTORY — DX: Reserved for concepts with insufficient information to code with codable children: IMO0002

## 2014-03-23 ENCOUNTER — Other Ambulatory Visit: Payer: Self-pay | Admitting: Gastroenterology

## 2014-03-25 ENCOUNTER — Other Ambulatory Visit: Payer: Self-pay | Admitting: Internal Medicine

## 2014-03-30 ENCOUNTER — Other Ambulatory Visit: Payer: Self-pay | Admitting: Internal Medicine

## 2014-04-24 ENCOUNTER — Ambulatory Visit: Payer: Medicare Other | Admitting: Nurse Practitioner

## 2014-04-25 ENCOUNTER — Ambulatory Visit: Payer: Medicare Other | Admitting: Nurse Practitioner

## 2014-04-30 ENCOUNTER — Other Ambulatory Visit: Payer: Self-pay | Admitting: *Deleted

## 2014-04-30 MED ORDER — LEVOTHYROXINE SODIUM 50 MCG PO TABS: ORAL_TABLET | ORAL | Status: DC

## 2014-04-30 MED ORDER — ATORVASTATIN CALCIUM 20 MG PO TABS: ORAL_TABLET | ORAL | Status: DC

## 2014-04-30 NOTE — Telephone Encounter (Signed)
[employer] [facility] King 

## 2014-05-02 ENCOUNTER — Other Ambulatory Visit: Payer: Self-pay

## 2014-05-02 ENCOUNTER — Other Ambulatory Visit: Payer: Self-pay | Admitting: Gastroenterology

## 2014-05-02 MED ORDER — TOPIRAMATE 25 MG PO TABS: ORAL_TABLET | ORAL | Status: DC

## 2014-05-02 NOTE — Telephone Encounter (Signed)
Prescribed at last OV.  Has appt scheduled in April.

## 2014-05-07 ENCOUNTER — Non-Acute Institutional Stay: Payer: Medicare (Managed Care) | Admitting: Internal Medicine

## 2014-05-07 ENCOUNTER — Encounter: Payer: Self-pay | Admitting: Internal Medicine

## 2014-05-07 VITALS — BP 148/84 | HR 60 | Temp 97.7°F | Ht 71.0 in | Wt 154.0 lb

## 2014-05-07 DIAGNOSIS — G252 Other specified forms of tremor: Secondary | ICD-10-CM

## 2014-05-07 DIAGNOSIS — B379 Candidiasis, unspecified: Secondary | ICD-10-CM

## 2014-05-07 DIAGNOSIS — R29818 Other symptoms and signs involving the nervous system: Secondary | ICD-10-CM

## 2014-05-07 DIAGNOSIS — R251 Tremor, unspecified: Secondary | ICD-10-CM

## 2014-05-07 DIAGNOSIS — M2040 Other hammer toe(s) (acquired), unspecified foot: Secondary | ICD-10-CM | POA: Diagnosis not present

## 2014-05-07 DIAGNOSIS — R262 Difficulty in walking, not elsewhere classified: Secondary | ICD-10-CM

## 2014-05-07 DIAGNOSIS — N3941 Urge incontinence: Secondary | ICD-10-CM | POA: Diagnosis not present

## 2014-05-07 DIAGNOSIS — I10 Essential (primary) hypertension: Secondary | ICD-10-CM | POA: Diagnosis not present

## 2014-05-07 DIAGNOSIS — G250 Essential tremor: Secondary | ICD-10-CM

## 2014-05-07 DIAGNOSIS — E039 Hypothyroidism, unspecified: Secondary | ICD-10-CM

## 2014-05-07 DIAGNOSIS — R2689 Other abnormalities of gait and mobility: Secondary | ICD-10-CM

## 2014-05-07 DIAGNOSIS — M25552 Pain in left hip: Secondary | ICD-10-CM | POA: Diagnosis not present

## 2014-05-07 NOTE — Progress Notes (Deleted)
Patient ID: Jose Leach, male   DOB: Nov 15, 1932, 81 y.o.   MRN: [id number]    [facility]    Place of Service: Clinic (12)    Allergies  Allergen Reactions  . Latex Rash  . Tape Rash    Paper tape is ok to use     Chief Complaint  Patient presents with  . Medical Management of Chronic Issues    New Patient - switching to Dr. Nyoka Cowden . Has yeast infection on buttocks, saw Dr. Nevada Crane last week. Pain in big toe left foot. Has appt with foot doctor tomorrow    HPI:  ***  Medications: Patient's Medications  New Prescriptions   No medications on file  Previous Medications   ACETAMINOPHEN (TYLENOL) 325 MG TABLET    Take 2 tablets (650 mg total) by mouth every 6 (six) hours as needed for mild pain (or Fever >/= 101).   ASPIRIN 81 MG TABLET    Take 81 mg by mouth every morning.    ATORVASTATIN (LIPITOR) 20 MG TABLET    Take one tablet by mouth once daily in the evening for cholesterol   ATORVASTATIN (LIPITOR) 20 MG TABLET    Take one tablet by mouth once daily for cholesterol   B COMPLEX-C (B-COMPLEX WITH VITAMIN C) TABLET    Take 1 tablet by mouth every morning.   BIMATOPROST (LUMIGAN) 0.01 % SOLN    Place 1 drop into both eyes at bedtime.   BRIMONIDINE (ALPHAGAN) 0.2 % OPHTHALMIC SOLUTION    Place one drop into the right eye two times daily   BUPROPION (WELLBUTRIN XL) 300 MG 24 HR TABLET    Take 300 mg by mouth every morning.    CHOLECALCIFEROL (VITAMIN D) 1000 UNITS TABLET    Take 2,000 Units by mouth 2 (two) times daily.    CLONAZEPAM (KLONOPIN) 0.5 MG TABLET    Take 0.5 tablets (0.25 mg total) by mouth 2 (two) times daily as needed (anxiety, agitation).   CLONAZEPAM (KLONOPIN) 0.5 MG TABLET    Take 1 tablet (0.5 mg total) by mouth at bedtime.   DOCUSATE SODIUM 100 MG CAPS    Take 100 mg by mouth 2 (two) times daily.   FLAXSEED, LINSEED, (FLAX SEEDS PO)    Take 1 tablet by mouth every morning.    FLUTICASONE (CUTIVATE) 0.05 % CREAM    Apply twice daily to  affected area   HYDROCODONE-ACETAMINOPHEN (NORCO/VICODIN) 5-325 MG PER TABLET    Take 1 tablet by mouth every 4 (four) hours as needed for moderate pain.   KRILL OIL 500 MG CAPS    Take 500 mg by mouth every morning.    L-METHYLFOLATE 7.5 MG TABS    Take 7.5 mg by mouth daily.   LACTOSE FREE NUTRITION (BOOST PLUS) LIQD    Take 237 mLs by mouth 3 (three) times daily with meals.   LEVOTHYROXINE (SYNTHROID) 50 MCG TABLET    Take 1/2 tablet by mouth on Sunday mornings and one tablet every other morning for thyroid   LEVOTHYROXINE (SYNTHROID, LEVOTHROID) 50 MCG TABLET    Take one tablet by mouth 30 minutes before breakfast on empty stomach once daily except on Sunday take 1/2 tablet for thyroid   LINACLOTIDE (LINZESS) 290 MCG CAPS CAPSULE    Take 290 mcg by mouth daily.   LINZESS 290 MCG CAPS CAPSULE    TAKE 1 CAPSULE (290 MCG TOTAL) BY MOUTH DAILY.   MIRTAZAPINE (REMERON) 45 MG TABLET  Take 45 mg by mouth at bedtime.   MULTIPLE VITAMINS-MINERALS (ICAPS AREDS FORMULA PO)    Take 1 capsule by mouth daily.    POLYETHYLENE GLYCOL (MIRALAX / GLYCOLAX) PACKET    Take 17 g by mouth daily as needed for mild constipation.   SENNOSIDES-DOCUSATE SODIUM (SENOKOT-S) 8.6-50 MG TABLET    Take 2 tablets by mouth 2 (two) times daily as needed.    SERTRALINE (ZOLOFT) 100 MG TABLET    Take two tablets by mouth once daily in the evening   TESTOSTERONE ([name]) 50 MG/5GM GEL    Place 5 g onto the skin every morning.    TOPIRAMATE (TOPAMAX) 25 MG TABLET    One tablet in the morning and two tablets in the evening   VITAMIN B-12 (CYANOCOBALAMIN) 1000 MCG TABLET    Take 1,000 mcg by mouth every morning.   Modified Medications   No medications on file  Discontinued Medications   No medications on file     Review of Systems  Constitutional: Negative for fever, chills and diaphoresis.       Barthel index of ADL 10/05/13 10/20-50% deficit  HENT: Positive for hearing loss. Negative for congestion, ear discharge, ear pain,  nosebleeds, sore throat and tinnitus.   Eyes: Positive for redness. Negative for photophobia, pain and discharge.       Resolving.   Respiratory: Negative for cough, shortness of breath, wheezing and stridor.   Cardiovascular: Negative for chest pain, palpitations and leg swelling.  Gastrointestinal: Negative for nausea, vomiting, abdominal pain, diarrhea, constipation and blood in stool.  Endocrine: Negative for polydipsia.  Genitourinary: Negative for dysuria, urgency, frequency, hematuria and flank pain.       Incontinent of B+B frequently. Enlarged scrotum and tender testicles.   Musculoskeletal: Positive for back pain and neck pain. Negative for myalgias.       10/05/13 Berg Balance Assessment: 34/56-hight risk for falling.   Skin: Negative for rash.       R hip hematoma-smaller-near resolution Sacral coccygeal area beefy redness and superficial skin missing spots-improving. Swelling, bruise, mild heat, and pain in the 1/3 lower R thigh, knee, and 1/3 upper R lower leg-resolving.     Allergic/Immunologic: Negative for environmental allergies.  Neurological: Positive for weakness and headaches (treated). Negative for dizziness, tremors and seizures.  Hematological: Does not bruise/bleed easily.  Psychiatric/Behavioral: Negative for suicidal ideas and hallucinations. The patient is nervous/anxious.     Filed Vitals:   05/07/14 1100  BP: 148/84  Pulse: 60  Temp: 97.7 F (36.5 C)  TempSrc: Oral  Height: 5\' 11"  (1.803 m)  Weight: 154 lb (69.854 kg)  SpO2: 97%   Body mass index is 21.49 kg/(m^2).  Physical Exam  Constitutional: He is oriented to person, place, and time. He appears well-developed and well-nourished. No distress.  HENT:  Head: Normocephalic and atraumatic.  Right Ear: External ear normal.  Left Ear: External ear normal.  Nose: Nose normal.  Mouth/Throat: Oropharynx is clear and moist. No oropharyngeal exudate.  Eyes: Conjunctivae and EOM are normal. Pupils are  equal, round, and reactive to light. Right eye exhibits no discharge. Left eye exhibits no discharge. No scleral icterus.  R+L eyes: injected, no discharge, denied itching or pain. R subconjunctival hemorrhage-healing nicely.   Neck: Normal range of motion. Neck supple. No JVD present. No tracheal deviation present. No thyromegaly present.  Cardiovascular: Normal rate, regular rhythm, normal heart sounds and intact distal pulses.   No murmur heard. Pulmonary/Chest: Effort normal and breath  sounds normal. No stridor. No respiratory distress. He has no wheezes. He has no rales. He exhibits no tenderness.  Abdominal: Soft. Bowel sounds are normal. He exhibits no distension. There is no tenderness. There is no rebound and no guarding.  Genitourinary:  A small external hemorrhoid 5pm. Incontinent of B+B frequently. Enlarged scrotum and tender testicles.    Musculoskeletal: Normal range of motion. He exhibits tenderness. He exhibits no edema.  Pain in his buttocks-feels like hemorrhoids-there is a small external  Hemorrhoid 5pm w/o bleeding-relieves with Hydrocodone/ApAp. Sacral coccygeal area beefy redness and superficial skin missing spots   Lymphadenopathy:    He has no cervical adenopathy.  Neurological: He is alert and oriented to person, place, and time. He has normal reflexes. No cranial nerve deficit. He exhibits normal muscle tone. Coordination normal.  Skin: Skin is warm and dry. No rash noted. He is not diaphoretic. No erythema. No pallor.  R hip hematoma-smaller-near resolution Sacral coccygeal area beefy redness and superficial skin missing spots-improving. Swelling, bruise, mild heat, and pain in the 1/3 lower R thigh, knee, and 1/3 upper R lower leg-resolving.  Psychiatric: His mood appears anxious. His affect is not angry, not blunt, not labile and not inappropriate. His speech is not rapid and/or pressured, not delayed, not tangential and not slurred. He is not agitated, not aggressive,  not hyperactive, not slowed, not withdrawn, not actively hallucinating and not combative. Thought content is not paranoid and not delusional. Cognition and memory are impaired. He does not express impulsivity or inappropriate judgment. He exhibits a depressed mood. He expresses no homicidal and no suicidal ideation. He expresses no suicidal plans and no homicidal plans. He is communicative. He exhibits abnormal recent memory. He exhibits normal remote memory. He is attentive.     Labs reviewed: No visits with results within 3 Month(s) from this visit. Latest known visit with results is:  Lab on 10/26/2013  Component Date Value Ref Range Status  . Hemoglobin 10/25/2013 16.8  13.5 - 17.5 g/dL Final  . HCT 10/25/2013 51  41 - 53 % Final  . Platelets 10/25/2013 117* 150 - 399 K/L Final  . WBC 10/25/2013 5.1   Final  . Glucose 10/25/2013 74   Final  . BUN 10/25/2013 27* 4 - 21 mg/dL Final  . Creatinine 10/25/2013 1.3  0.6 - 1.3 mg/dL Final  . Potassium 10/25/2013 4.0  3.4 - 5.3 mmol/L Final  . Sodium 10/25/2013 139  137 - 147 mmol/L Final  . Alkaline Phosphatase 10/25/2013 52  25 - 125 U/L Final  . ALT 10/25/2013 14  10 - 40 U/L Final  . AST 10/25/2013 20  14 - 40 U/L Final  . Bilirubin, Total 10/25/2013 0.6   Final  . TSH 10/25/2013 2.77  0.41 - 5.90 uIU/mL Final     Assessment/Plan

## 2014-05-16 ENCOUNTER — Other Ambulatory Visit: Payer: Self-pay | Admitting: *Deleted

## 2014-05-16 MED ORDER — LINACLOTIDE 290 MCG PO CAPS: 290.0000 ug | ORAL_CAPSULE | Freq: Every day | ORAL | Status: DC

## 2014-05-22 ENCOUNTER — Encounter: Payer: Self-pay | Admitting: Internal Medicine

## 2014-05-22 DIAGNOSIS — M25552 Pain in left hip: Secondary | ICD-10-CM | POA: Insufficient documentation

## 2014-05-22 DIAGNOSIS — B379 Candidiasis, unspecified: Secondary | ICD-10-CM | POA: Insufficient documentation

## 2014-05-22 NOTE — Progress Notes (Signed)
Patient ID: Jose Leach, male   DOB: [birth year], 81 y.o.   MRN: [id number]    HISTORY AND PHYSICAL  Location:  [place] of Service: Clinic (12)   Extended Emergency Contact Information Primary Emergency Contact: Gilder,[name] Address: [address]          [place], [place] [address] [place] Phone: [phone] Mobile Phone: [phone] Relation: Spouse  Advanced Directive information Does patient have an advance directive?: Yes, Type of Advance Directive: Healthcare Power of Attorney  Chief Complaint  Patient presents with  . Medical Management of Chronic Issues    New Patient - switching to Dr. Nyoka Cowden . Has yeast infection on buttocks, saw Dr. Nevada Crane last week. Pain in big toe left foot. Has appt with foot doctor tomorrow    HPI:  Patient is being transferred to the care of Dr. Jeanmarie Hubert as PCP. Previous PCP was Dr. Unice Cobble. Although he was initially seen by me in the SNF portion of [place], he is now back in his independent living apartment. He requires the assistance of his wife to live" independently".  Hammer toe, unspecified laterality: Tender but without ulceration  Essential hypertension: Controlled  Hypothyroidism, unspecified hypothyroidism type: Compensated  Pain in left hip: Previous total hip replacement. He did fall last week, but does not believe he hurt his hip at that time.  Urge incontinence: Chronic condition. Has been followed by Dr. Lawerance Bach, urologist. Patient does self-catheterization for urinary retention and incontinence.  Essential and other specified forms of tremor: Moderate intensity. Does not interfere with handling utensils.  Balance problems: Significant issue of uncertain etiology.  Difficulty walking: Using a 4 wheel walker  Candida infection: Has been treated by Dr. Nevada Crane, dermatologist for 3 months. His used various creams.    Past Medical History  Diagnosis Date  .  Migraines     Dr Jannifer Franklin  . Peripheral neuropathy   . Benign essential tremor   . Cervical stenosis of spine   . Anemia     B12 deficiency  . Testosterone deficiency     Dr Lawerance Bach, [facility]  . Hypothyroidism   . Hyperlipidemia   . Depression     Dr Albertine Patricia  . History of shingles 2009  . H/O cardiovascular stress test     a. 02/2003 -> no ischemia/infarct.  Marland Kitchen Hx of echocardiogram     Echocardiogram 08/01/12: Mild LVH, EF 55-60%, normal wall motion.  Marland Kitchen PAD (peripheral artery disease)   . Carotid stenosis     s/p L CEA  . Fecal impaction 10/11/2012  . Macular edema   . Cervical spinal stenosis   . Hematoma of leg   . Acute urinary retention 01/28/2013  . AKI (acute kidney injury) 01/26/2013  . Altered mental status 09/09/2013  . ANXIETY 06/07/2007    Qualifier: Diagnosis of  By: Talbert Cage CMA ([place]), June    . ARTHRITIS 06/07/2007    Qualifier: Diagnosis of  By: Talbert Cage CMA ([place]), June    . Balance problems 04/05/2011  . Benign fibroma of prostate 10/11/2011  . Bilateral inguinal hernia 10/09/2012  . Bilateral leg edema 09/13/2012  . Bladder retention 02/05/2013  . Bradycardia, sinus 08/02/2012  . 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 CAROTID ARTERY DISEASE 03/31/2007    Qualifier: Diagnosis of  By: Linna Darner MD, Gwyndolyn Saxon    . Degeneration macular 11/02/2011  . Difficulty in walking(719.7) 04/05/2011  . ESOPHAGITIS 08/06/2002    Qualifier: Diagnosis  of  By: Talbert Cage CMA Deborra Medina), June    . Essential and other specified forms of tremor 04/18/2013  . Family history of colon cancer 07/24/2012  . Femoral hernia, bilateral s/p lap repair 01/16/2013 01/16/2013  . HAMMER TOE 04/23/2010    Qualifier: Diagnosis of  By: Linna Darner MD, Gwyndolyn Saxon    . Hip hematoma, right 09/09/2013  . HTN (hypertension) 08/22/2012  . Hydrocele 10/11/2011  . 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 MORTON'S NEUROMA, LEFT 08/22/2007    Qualifier: Diagnosis of  By: Linna Darner MD, Gwyndolyn Saxon    . Personal history of colonic polyps 01/05/2013     2014 2 mm adenomatous polyp   . PREMATURE ATRIAL CONTRACTIONS 03/14/2006    Qualifier: Diagnosis of  By: Linna Darner MD, Gwendolyn Lima 12/16/2011  . SPINAL STENOSIS 03/14/2006    Qualifier: Diagnosis of  By: Linna Darner MD, William   Cervical spine    . Unspecified vitamin D deficiency 08/06/2012  . Urge incontinence 10/09/2012  . Urinary tract infection, site not specified 02/13/2013  . Weakness 08/02/2012  . Glaucoma, compensated 03/21/2014  . Cataract, nuclear 03/21/2014    Past Surgical History  Procedure Laterality Date  . Appendectomy  1941  . Lumbar laminectomy    . Carotid endarterectomy  2005     L  . Tonsillectomy and adenoidectomy    . Total hip arthroplasty  2005    L  . Cataract extraction Right   . Eye surgery Right 02/2012    retina  . Inguinal hernia repair Bilateral 01/16/2013    Procedure: LAPAROSCOPIC BILATERAL INGUINAL DIRECT AND INDIRECT, FEMORAL, AND OBTURATOR HERNIAS;  Surgeon: Adin Hector, MD;  Location: [place];  Service: General;  Laterality: Bilateral;  . Insertion of mesh N/A 01/16/2013    Procedure: INSERTION OF MESH;  Surgeon: Adin Hector, MD;  Location: [place];  Service: General;  Laterality: N/A;  . Breast cyst excision Left 01/16/2013    Procedure: MASS EXCISION AXILLA;  Surgeon: Adin Hector, MD;  Location: [place];  Service: General;  Laterality: Left;  . Colonoscopy  2014    Dr. Deatra Ina    Patient Care Team: Estill Dooms, MD as PCP - General (Internal Medicine) [name], MD as Consulting Physician (Urology) [name], MD as Consulting Physician (Neurology) [name], MD as Consulting Physician (Dermatology) [name], MD as Consulting Physician (Gastroenterology) [name], MD as Consulting Physician (Orthopedic Surgery) [name], MD as Consulting Physician (Cardiology) [name], MD as Consulting Physician (Ophthalmology) [name], MD as Referring Physician (Psychiatry) [name], MD as  Consulting Physician (General Surgery)  History   Social History  . Marital Status: Married    Spouse Name: [name]  . Number of Children: 2  . Years of Education: N/A   Occupational History  . retired     Social History Main Topics  . Smoking status: Former Smoker -- 10 years    Types: Pipe, Cigars    Quit date: 02/16/1979  . Smokeless tobacco: Never Used     Comment: Quit about age 50   . Alcohol Use: Yes     Comment:  occasional  . Drug Use: No  . Sexual Activity: No   Other Topics Concern  . Not on file   Social History Narrative   Lives at [facility] since 2013   Married - [name]   Former smoker -stopped 1981   Alcohol -wine occasionally    Exercise -  walking, [place]    POA   Walks with walker     reports that he quit smoking about 35 years ago. His smoking use included Pipe and Cigars. He has never used smokeless tobacco. He reports that he drinks alcohol. He reports that he does not use illicit drugs.  Family History  Problem Relation Age of Onset  . Stroke Mother 40  . Hypertension Mother   . Diabetes Mother   . Heart disease Mother   . Rectal cancer Father 49  . Heart disease Father     in 62s  . Cancer Father     colorectal  . Nephritis Sister   . Seizures Brother     died as child   Family Status  Relation Status Death Age  . Mother Deceased 74  . Father Deceased 39  . Sister Deceased   . Brother Deceased 4  . Sister Alive   . Daughter Alive   . Son Alive     Immunization History  Administered Date(s) Administered  . DTaP 11/05/2005  . Hepatitis B 1932-03-28  . Influenza Split 11/12/2011, 11/29/2013  . Influenza Whole 10/30/2007, 11/12/2008, 11/26/2009  . PPD Test 06/23/2011  . Pneumococcal Polysaccharide-23 02/16/1999  . Varicella 11/09/2005  . Zoster 08/30/2006    Allergies  Allergen Reactions  . Latex Rash  . Tape Rash    Paper tape is ok to use     Medications: Patient's Medications  New Prescriptions   No  medications on file  Previous Medications   ACETAMINOPHEN (TYLENOL) 325 MG TABLET    Take 2 tablets (650 mg total) by mouth every 6 (six) hours as needed for mild pain (or Fever >/= 101).   ASPIRIN 81 MG TABLET    Take 81 mg by mouth every morning.    ATORVASTATIN (LIPITOR) 20 MG TABLET    Take one tablet by mouth once daily in the evening for cholesterol   ATORVASTATIN (LIPITOR) 20 MG TABLET    Take one tablet by mouth once daily for cholesterol   B COMPLEX-C (B-COMPLEX WITH VITAMIN C) TABLET    Take 1 tablet by mouth every morning.   BIMATOPROST (LUMIGAN) 0.01 % SOLN    Place 1 drop into both eyes at bedtime.   BRIMONIDINE (ALPHAGAN) 0.2 % OPHTHALMIC SOLUTION    Place one drop into the right eye two times daily   BUPROPION (WELLBUTRIN XL) 300 MG 24 HR TABLET    Take 300 mg by mouth every morning.    CHOLECALCIFEROL (VITAMIN D) 1000 UNITS TABLET    Take 2,000 Units by mouth 2 (two) times daily.    CLONAZEPAM (KLONOPIN) 0.5 MG TABLET    Take 0.5 tablets (0.25 mg total) by mouth 2 (two) times daily as needed (anxiety, agitation).   CLONAZEPAM (KLONOPIN) 0.5 MG TABLET    Take 1 tablet (0.5 mg total) by mouth at bedtime.   DOCUSATE SODIUM 100 MG CAPS    Take 100 mg by mouth 2 (two) times daily.   FLAXSEED, LINSEED, (FLAX SEEDS PO)    Take 1 tablet by mouth every morning.    FLUTICASONE (CUTIVATE) 0.05 % CREAM    Apply twice daily to affected area   HYDROCODONE-ACETAMINOPHEN (NORCO/VICODIN) 5-325 MG PER TABLET    Take 1 tablet by mouth every 4 (four) hours as needed for moderate pain.   KRILL OIL 500 MG CAPS    Take 500 mg by mouth every morning.    L-METHYLFOLATE 7.5 MG TABS  Take 7.5 mg by mouth daily.   LACTOSE FREE NUTRITION (BOOST PLUS) LIQD    Take 237 mLs by mouth 3 (three) times daily with meals.   LEVOTHYROXINE (SYNTHROID) 50 MCG TABLET    Take 1/2 tablet by mouth on Sunday mornings and one tablet every other morning for thyroid   LEVOTHYROXINE (SYNTHROID, LEVOTHROID) 50 MCG TABLET     Take one tablet by mouth 30 minutes before breakfast on empty stomach once daily except on Sunday take 1/2 tablet for thyroid   LINZESS 290 MCG CAPS CAPSULE    TAKE 1 CAPSULE (290 MCG TOTAL) BY MOUTH DAILY.   MIRTAZAPINE (REMERON) 45 MG TABLET    Take 45 mg by mouth at bedtime.   MULTIPLE VITAMINS-MINERALS (ICAPS AREDS FORMULA PO)    Take 1 capsule by mouth daily.    POLYETHYLENE GLYCOL (MIRALAX / GLYCOLAX) PACKET    Take 17 g by mouth daily as needed for mild constipation.   SENNOSIDES-DOCUSATE SODIUM (SENOKOT-S) 8.6-50 MG TABLET    Take 2 tablets by mouth 2 (two) times daily as needed.    SERTRALINE (ZOLOFT) 100 MG TABLET    Take two tablets by mouth once daily in the evening   TESTOSTERONE ([name]) 50 MG/5GM GEL    Place 5 g onto the skin every morning.    TOPIRAMATE (TOPAMAX) 25 MG TABLET    One tablet in the morning and two tablets in the evening   VITAMIN B-12 (CYANOCOBALAMIN) 1000 MCG TABLET    Take 1,000 mcg by mouth every morning.   Modified Medications   Modified Medication Previous Medication   LINACLOTIDE (LINZESS) 290 MCG CAPS CAPSULE Linaclotide (LINZESS) 290 MCG CAPS capsule      Take 1 capsule (290 mcg total) by mouth daily.    Take 290 mcg by mouth daily.  Discontinued Medications   No medications on file    Review of Systems  Constitutional: Positive for activity change and fatigue. Negative for fever, chills, diaphoresis, appetite change and unexpected weight change.  HENT: Positive for hearing loss. Negative for sore throat, trouble swallowing and voice change.   Eyes: Positive for visual disturbance (corrective lenses).  Cardiovascular: Positive for leg swelling. Negative for chest pain and palpitations.  Gastrointestinal: Positive for constipation. Negative for nausea, vomiting, abdominal pain and abdominal distention.  Endocrine:       Hypothyroid. Testosterone deficiency.  Genitourinary: Positive for urgency, frequency, enuresis and difficulty urinating. Negative for  dysuria, hematuria, flank pain and discharge.  Musculoskeletal: Positive for back pain, arthralgias and gait problem.       Unstable gait and hx falls  Skin: Negative.   Neurological: Positive for tremors, weakness and headaches. Negative for dizziness, seizures, syncope, facial asymmetry, light-headedness and numbness.       Recent altered mentation when ill. Mild dementia.  Hematological: Negative.   Psychiatric/Behavioral: Negative for agitation. The patient is nervous/anxious.        Hx depression    Filed Vitals:   05/07/14 1100  BP: 148/84  Pulse: 60  Temp: 97.7 F (36.5 C)  TempSrc: Oral  Height: 5\' 11"  (1.803 m)  Weight: 154 lb (69.854 kg)  SpO2: 97%   Body mass index is 21.49 kg/(m^2).  Physical Exam  Constitutional: He is oriented to person, place, and time.  Thin, frail  HENT:  Head: Normocephalic and atraumatic.  Hearing loss  Eyes: Conjunctivae and EOM are normal. Pupils are equal, round, and reactive to light.  Neck: No JVD present. No tracheal deviation  present. No thyromegaly present.  Cardiovascular: Normal rate, regular rhythm and normal heart sounds.  Exam reveals no gallop and no friction rub.   No murmur heard. Pulmonary/Chest: No respiratory distress. He has no wheezes. He has no rales. He exhibits no tenderness.  Abdominal: He exhibits no distension and no mass. There is no tenderness.  Musculoskeletal: Normal range of motion. He exhibits edema. He exhibits no tenderness.  Lurching, unstable gait. Using four-wheel walker.  Lymphadenopathy:    He has no cervical adenopathy.  Neurological: He is alert and oriented to person, place, and time. No cranial nerve deficit. Coordination abnormal.  Tremor bilaterally  Skin: No rash noted. No erythema. No pallor.  Psychiatric: He has a normal mood and affect. His behavior is normal. Thought content normal.     Labs reviewed: Nursing Home on 05/07/2014  Component Date Value Ref Range Status  . HM  Colonoscopy 05/06/2012 Dr. Deatra Ina   Final     Assessment/Plan  1. Hammer toe, unspecified laterality Left second toe. Painful. Will be seeing a podiatrist this week.  2. Essential hypertension Mild elevation in systolic blood pressure. He says it generally runs normal. We will continue to monitor this. No change in medications today.  3. Hypothyroidism, unspecified hypothyroidism type Needs follow-up of TSH next visit  4. Pain in left hip Residual tenderness. Previously had a hematoma in this area.  5. Urge incontinence Unchanged. Does self-catheterization. Has some bladder retention issues.  6. Essential and other specified forms of tremor Unchanged. Currently treated with clonazepam.  7. Balance problems Severe balance issues cause a lurching unstable gait.  8. Difficulty walking Uses a walker  9. Candida infection Seen by Dr. Nevada Crane, dermatologist last week. Candida infection of the buttocks is been treated for a couple of months. Using fluticasone/ketoconazolel cream to the rash twice daily.

## 2014-06-04 ENCOUNTER — Telehealth: Payer: Self-pay | Admitting: Cardiovascular Disease

## 2014-06-04 NOTE — Telephone Encounter (Signed)
Received records from [facility] for appointment with Dr Gwenlyn Found on 06/05/14.  Records given to [facility] (medical records) for Dr Kennon Holter schedule on 06/05/14. lp

## 2014-06-05 ENCOUNTER — Ambulatory Visit (INDEPENDENT_AMBULATORY_CARE_PROVIDER_SITE_OTHER): Payer: Medicare Other | Admitting: Cardiovascular Disease

## 2014-06-05 ENCOUNTER — Encounter: Payer: Self-pay | Admitting: Cardiovascular Disease

## 2014-06-05 VITALS — BP 140/72 | HR 56 | Ht 72.0 in | Wt 152.0 lb

## 2014-06-05 DIAGNOSIS — S91302A Unspecified open wound, left foot, initial encounter: Secondary | ICD-10-CM

## 2014-06-05 DIAGNOSIS — I6529 Occlusion and stenosis of unspecified carotid artery: Secondary | ICD-10-CM

## 2014-06-05 NOTE — Patient Instructions (Signed)
  We will see you back in follow up only as needed.   Dr Gwenlyn Found has ordered: 1. Carotid Duplex- This test is an ultrasound of the carotid arteries in your neck. It looks at blood flow through these arteries that supply the brain with blood. Allow one hour for this exam. There are no restrictions or special instructions.  2. Lower extremity arterial doppler- During this test, ultrasound is used to evaluate arterial blood flow in the legs. Allow approximately one hour for this exam.

## 2014-06-05 NOTE — Assessment & Plan Note (Signed)
The patient was referred to me by Dr. Fritzi Mandes, , his podiatrist at [facility] for peripheral vascular evaluation.he has no chronic retractors. He lives with his wife [name] at [facility] in independent living. He does have peripheral neuropathy and walks with a walker. He had a slowly healing left second toe ulcer. His feet are somewhat cold to touch with dependent rubor although he does have 1+ palpable pedal pulses. Dopplers provided by his podiatrist showed monophasic waveforms with normal ABIs and slightly reduced TBI's. I'm going to repeat these the office as well as carotid Dopplers since he is status post remote left carotid endarterectomy I will see him back as needed unless his Doppler show abnormalities requiring further intervention.

## 2014-06-05 NOTE — Progress Notes (Signed)
06/05/2014 Jose Leach   [birth year]  [id number]  Primary Physician GREEN, Viviann Spare, MD Primary Cardiologist: Lorretta Harp MD [name]   HPI:  Mr. Jose Leach is an 81 year old thin-appearing married Caucasian male father of 2 children, grandfather and 4 grandchildren accompany by his wife L in today. He was referred by Dr. Rosemary Holms , podiatrist at [facility], for peripheral vascular evaluation. His primary care physician is Dr. Elder Negus. He is a retired Ship broker at [employer]. He has no cardiac risk factors. He's never had a heart attack or stroke and denies chest pain but does get mild dyspnea. He's probably fairly deconditioned. He does have peripheral neuropathy wants with the aid of a walker. He's had a slowly healing left second toe ulcer however this has made significant progress. Dopplers performed in the podiatrist's office showed normal ABIs with monophasic waveforms and slightly diminished TBIs. He has had remote left carotid endarterectomy.   Current Outpatient Prescriptions  Medication Sig Dispense Refill  . aspirin 81 MG tablet Take 81 mg by mouth every morning.     Marland Kitchen atorvastatin (LIPITOR) 20 MG tablet Take one tablet by mouth once daily in the evening for cholesterol    . B Complex-C (B-COMPLEX WITH VITAMIN C) tablet Take 1 tablet by mouth every morning.    . bimatoprost (LUMIGAN) 0.01 % SOLN Place 1 drop into both eyes at bedtime.    . brimonidine (ALPHAGAN) 0.2 % ophthalmic solution Place one drop into the right eye two times daily    . buPROPion (WELLBUTRIN XL) 300 MG 24 hr tablet Take 300 mg by mouth every morning.     . cholecalciferol (VITAMIN D) 1000 UNITS tablet Take 2,000 Units by mouth 2 (two) times daily.     . clonazePAM (KLONOPIN) 0.5 MG tablet Take 0.5 tablets (0.25 mg total) by mouth 2 (two) times daily as needed (anxiety, agitation). 30 tablet 0  . Flaxseed, Linseed, (FLAX SEEDS PO) Take 1 tablet by mouth every  morning.     . fluticasone (CUTIVATE) 0.05 % cream Apply twice daily to affected area    . Krill Oil 500 MG CAPS Take 500 mg by mouth every morning.     Marland Kitchen L-Methylfolate 7.5 MG TABS Take 7.5 mg by mouth daily.    Marland Kitchen lactose free nutrition (BOOST PLUS) LIQD Take 237 mLs by mouth 3 (three) times daily with meals. 90 Can 6  . levothyroxine (SYNTHROID) 50 MCG tablet Take 1/2 tablet by mouth on Sunday mornings and one tablet every other morning for thyroid 30 tablet 2  . levothyroxine (SYNTHROID, LEVOTHROID) 50 MCG tablet Take one tablet by mouth 30 minutes before breakfast on empty stomach once daily except on Sunday take 1/2 tablet for thyroid    . Linaclotide (LINZESS) 290 MCG CAPS capsule Take 1 capsule (290 mcg total) by mouth daily. 30 capsule 1  . mirtazapine (REMERON) 45 MG tablet Take 45 mg by mouth at bedtime.    . Multiple Vitamins-Minerals (ICAPS AREDS FORMULA PO) Take 1 capsule by mouth daily.     . polyethylene glycol (MIRALAX / GLYCOLAX) packet Take 17 g by mouth daily as needed for mild constipation.    . sertraline (ZOLOFT) 100 MG tablet Take two tablets by mouth once daily in the evening    . tamsulosin (FLOMAX) 0.4 MG CAPS capsule TAKE 1 CAPSULE (0.4 MG TOTAL) BY MOUTH DAILY.    Marland Kitchen testosterone ([name]) 50 MG/5GM GEL Place 5 g onto  the skin every morning.     . topiramate (TOPAMAX) 25 MG tablet One tablet in the morning and two tablets in the evening 90 tablet 1   No current facility-administered medications for this visit.    Allergies  Allergen Reactions  . Latex Rash  . Tape Rash    Paper tape is ok to use     History   Social History  . Marital Status: Married    Spouse Name: Jose Leach  . Number of Children: 2  . Years of Education: N/A   Occupational History  . retired     Social History Main Topics  . Smoking status: Former Smoker -- 10 years    Types: Pipe, Cigars    Quit date: 02/16/1979  . Smokeless tobacco: Never Used     Comment: Quit about age 70   .  Alcohol Use: Yes     Comment:  occasional  . Drug Use: No  . Sexual Activity: No   Other Topics Concern  . Not on file   Social History Narrative   Lives at [facility] since 2013   Married - Jose Leach   Former smoker -stopped 1981   Alcohol -wine occasionally    Exercise - walking, [place]    [employer] with walker     Review of Systems: General: negative for chills, fever, night sweats or weight changes.  Cardiovascular: negative for chest pain, dyspnea on exertion, edema, orthopnea, palpitations, paroxysmal nocturnal dyspnea or shortness of breath Dermatological: negative for rash Respiratory: negative for cough or wheezing Urologic: negative for hematuria Abdominal: negative for nausea, vomiting, diarrhea, bright red blood per rectum, melena, or hematemesis Neurologic: negative for visual changes, syncope, or dizziness All other systems reviewed and are otherwise negative except as noted above.    Blood pressure 140/72, pulse 56, height 6' (1.829 m), weight 152 lb (68.947 kg).  General appearance: alert and no distress Neck: no adenopathy, no carotid bruit, no JVD, supple, symmetrical, trachea midline and thyroid not enlarged, symmetric, no tenderness/mass/nodules Lungs: clear to auscultation bilaterally Heart: regular rate and rhythm, S1, S2 normal, no murmur, click, rub or gallop Extremities: dependent rubor, cool to touch bilaterally with 1+ pedal pulses  EKG not performed today  ASSESSMENT AND PLAN:   Peripheral vascular disease The patient was referred to me by Dr. Fritzi Mandes, , his podiatrist at [facility] for peripheral vascular evaluation.he has no chronic retractors. He lives with his wife Jose Leach at [facility] in independent living. He does have peripheral neuropathy and walks with a walker. He had a slowly healing left second toe ulcer. His feet are somewhat cold to touch with dependent rubor although he does have 1+ palpable pedal pulses.  Dopplers provided by his podiatrist showed monophasic waveforms with normal ABIs and slightly reduced TBI's. I'm going to repeat these the office as well as carotid Dopplers since he is status post remote left carotid endarterectomy I will see him back as needed unless his Doppler show abnormalities requiring further intervention.       Lorretta Harp MD FACP,FACC,FAHA, [facility] 06/05/2014 11:57 AM

## 2014-06-11 ENCOUNTER — Ambulatory Visit: Payer: Self-pay | Admitting: Nurse Practitioner

## 2014-06-24 ENCOUNTER — Ambulatory Visit (HOSPITAL_COMMUNITY)
Admission: RE | Admit: 2014-06-24 | Discharge: 2014-06-24 | Disposition: A | Discharge status: Home / Self Care | Payer: Medicare Other | Source: Ambulatory Visit | Attending: Cardiology | Admitting: Cardiology

## 2014-06-24 ENCOUNTER — Other Ambulatory Visit: Payer: Self-pay | Admitting: Cardiovascular Disease

## 2014-06-24 DIAGNOSIS — L97529 Non-pressure chronic ulcer of other part of left foot with unspecified severity: Secondary | ICD-10-CM | POA: Insufficient documentation

## 2014-06-24 DIAGNOSIS — I6521 Occlusion and stenosis of right carotid artery: Secondary | ICD-10-CM | POA: Diagnosis not present

## 2014-06-24 DIAGNOSIS — Z48812 Encounter for surgical aftercare following surgery on the circulatory system: Secondary | ICD-10-CM | POA: Insufficient documentation

## 2014-06-24 DIAGNOSIS — R0989 Other specified symptoms and signs involving the circulatory and respiratory systems: Secondary | ICD-10-CM

## 2014-06-24 DIAGNOSIS — I70203 Unspecified atherosclerosis of native arteries of extremities, bilateral legs: Secondary | ICD-10-CM | POA: Diagnosis not present

## 2014-06-24 DIAGNOSIS — I6529 Occlusion and stenosis of unspecified carotid artery: Secondary | ICD-10-CM

## 2014-06-24 DIAGNOSIS — S91302A Unspecified open wound, left foot, initial encounter: Secondary | ICD-10-CM

## 2014-06-24 DIAGNOSIS — I779 Disorder of arteries and arterioles, unspecified: Secondary | ICD-10-CM | POA: Diagnosis not present

## 2014-06-24 DIAGNOSIS — I739 Peripheral vascular disease, unspecified: Secondary | ICD-10-CM | POA: Diagnosis not present

## 2014-06-24 NOTE — Progress Notes (Signed)
Carotid Duplex Completed. Mild plaque bilaterally with no evidence of a significant stenosis.  Oda Cogan, BS, RDMS, RVT

## 2014-07-06 ENCOUNTER — Telehealth: Payer: Self-pay | Admitting: Internal Medicine

## 2014-07-06 NOTE — Telephone Encounter (Signed)
Phone call from wife re falls x5 this week

## 2014-07-09 ENCOUNTER — Non-Acute Institutional Stay: Payer: Medicare (Managed Care) | Admitting: Internal Medicine

## 2014-07-09 VITALS — BP 114/60 | HR 60 | Temp 97.5°F | Wt 154.0 lb

## 2014-07-09 DIAGNOSIS — I10 Essential (primary) hypertension: Secondary | ICD-10-CM | POA: Diagnosis not present

## 2014-07-09 DIAGNOSIS — H3532 Exudative age-related macular degeneration: Secondary | ICD-10-CM

## 2014-07-09 DIAGNOSIS — F32A Depression, unspecified: Secondary | ICD-10-CM

## 2014-07-09 DIAGNOSIS — W19XXXA Unspecified fall, initial encounter: Secondary | ICD-10-CM | POA: Insufficient documentation

## 2014-07-09 DIAGNOSIS — H3530 Unspecified macular degeneration: Secondary | ICD-10-CM | POA: Diagnosis not present

## 2014-07-09 DIAGNOSIS — R296 Repeated falls: Secondary | ICD-10-CM | POA: Insufficient documentation

## 2014-07-09 DIAGNOSIS — R2689 Other abnormalities of gait and mobility: Secondary | ICD-10-CM

## 2014-07-09 DIAGNOSIS — M2042 Other hammer toe(s) (acquired), left foot: Secondary | ICD-10-CM

## 2014-07-09 DIAGNOSIS — H353290 Exudative age-related macular degeneration, unspecified eye, stage unspecified: Secondary | ICD-10-CM

## 2014-07-09 DIAGNOSIS — F411 Generalized anxiety disorder: Secondary | ICD-10-CM

## 2014-07-09 DIAGNOSIS — F329 Major depressive disorder, single episode, unspecified: Secondary | ICD-10-CM | POA: Insufficient documentation

## 2014-07-09 DIAGNOSIS — G250 Essential tremor: Secondary | ICD-10-CM

## 2014-07-09 DIAGNOSIS — R29818 Other symptoms and signs involving the nervous system: Secondary | ICD-10-CM

## 2014-07-09 DIAGNOSIS — R251 Tremor, unspecified: Secondary | ICD-10-CM | POA: Diagnosis not present

## 2014-07-09 DIAGNOSIS — G252 Other specified forms of tremor: Secondary | ICD-10-CM

## 2014-07-09 NOTE — Progress Notes (Signed)
Patient ID: Jose Leach, male   DOB: August 16, 1932, 81 y.o.   MRN: [id number]    [place] [facility]    Place of Service: Clinic (12) OFFICE    Allergies  Allergen Reactions  . Latex Rash  . Tape Rash    Paper tape is ok to use     Chief Complaint  Patient presents with  . Fall    5 times in past 3 days, unsteady balance, walks with walker. Elbows, back of head sore, few skin abrasions.     HPI:  Fall, initial encounter: patient may have had an increased gait instability over the last few weeks. He has been falling more. He does use his walker. There is some degree of retropulsion.  Balance problems: Felt predominantly related to chronic cerebral atrophy and chronic small vessel ischemic cerebrovascular disease with tiny lacunar infarcts. Patient has been seen by Dr. Jannifer Franklin at [facility]. He has a known gait disturbance. There is cervical spondylosis with spinal stenosis with cord impingement on a study done in 2013. He has chronic knee pains, especially of the right knee.  Essential and other specified forms of tremor: Unchanged  Essential hypertension: Controlled. No possible significance in regards to his falling is that he was started on Klonopin 2 weeks ago.  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    ATORVASTATIN (LIPITOR) 20 MG TABLET    Take one tablet by mouth once daily in the evening for cholesterol   B COMPLEX-C (B-COMPLEX WITH VITAMIN C) TABLET    Take 1 tablet by mouth every morning.   BIMATOPROST  (LUMIGAN) 0.01 % SOLN    Place 1 drop into both eyes at bedtime.   BRIMONIDINE (ALPHAGAN) 0.15 % OPHTHALMIC SOLUTION       BUPROPION (WELLBUTRIN XL) 300 MG 24 HR TABLET    Take 300 mg by mouth every morning.    CHOLECALCIFEROL (VITAMIN D) 1000 UNITS TABLET    Take 2,000 Units by mouth 2 (two) times daily.    CLONAZEPAM (KLONOPIN) 0.5 MG TABLET    Take 0.5 tablets (0.25 mg total) by mouth 2 (two) times daily as needed (anxiety, agitation).   FLAXSEED, LINSEED, (FLAX SEEDS PO)    Take 1 tablet by mouth every morning.    FLUTICASONE (CUTIVATE) 0.05 % CREAM    Apply twice daily to affected area   KETOCONAZOLE (NIZORAL) 2 % CREAM       KRILL OIL 500 MG CAPS    Take 500 mg by mouth every morning.    L-METHYLFOLATE 7.5 MG TABS    Take 7.5 mg by mouth daily.   LACTOSE FREE NUTRITION (BOOST PLUS) LIQD    Take 237 mLs by mouth 3 (three) times daily with meals.   LEVOTHYROXINE (SYNTHROID) 50 MCG TABLET    Take 1/2 tablet by mouth on Sunday mornings and one tablet every other morning for thyroid   LINACLOTIDE (LINZESS) 290 MCG CAPS CAPSULE    Take 1 capsule (290 mcg total) by mouth daily.   MIRTAZAPINE (REMERON) 45 MG  TABLET    Take 45 mg by mouth at bedtime.   MULTIPLE VITAMINS-MINERALS (ICAPS AREDS FORMULA PO)    Take 1 capsule by mouth daily.    POLYETHYLENE GLYCOL (MIRALAX / GLYCOLAX) PACKET    Take 17 g by mouth daily as needed for mild constipation.   SERTRALINE (ZOLOFT) 100 MG TABLET    Take two tablets by mouth once daily in the evening   TAMSULOSIN (FLOMAX) 0.4 MG CAPS CAPSULE    TAKE 1 CAPSULE (0.4 MG TOTAL) BY MOUTH DAILY.   TESTOSTERONE ([name]) 50 MG/5GM GEL    Place 5 g onto the skin every morning.    TOPIRAMATE (TOPAMAX) 25 MG TABLET    One tablet in the morning and two tablets in the evening  Modified Medications   No medications on file  Discontinued Medications   BRIMONIDINE (ALPHAGAN) 0.2 % OPHTHALMIC SOLUTION    Place one drop into the right eye two times daily   LEVOTHYROXINE  (SYNTHROID, LEVOTHROID) 50 MCG TABLET    Take one tablet by mouth 30 minutes before breakfast on empty stomach once daily except on Sunday take 1/2 tablet for thyroid     Review of Systems  Constitutional: Positive for activity change and fatigue. Negative for fever, chills, diaphoresis, appetite change and unexpected weight change.       Complains of generalized weakness  HENT: Positive for hearing loss. Negative for sore throat, trouble swallowing and voice change.   Eyes: Positive for visual disturbance (corrective lenses).       History of wet macular degeneration  Respiratory: Positive for shortness of breath (On exertion).   Cardiovascular: Positive for leg swelling. Negative for chest pain and palpitations.  Gastrointestinal: Positive for constipation. Negative for nausea, vomiting, abdominal pain and abdominal distention.  Endocrine:       Hypothyroid. Testosterone deficiency.  Genitourinary: Positive for urgency, frequency, enuresis and difficulty urinating. Negative for dysuria, hematuria, flank pain and discharge.  Musculoskeletal: Positive for back pain, arthralgias and gait problem.       Unstable gait and hx falls. Knee pains, particularly in the right knee.  Skin: Negative.   Neurological: Positive for tremors, weakness and headaches. Negative for dizziness, seizures, syncope, facial asymmetry, light-headedness and numbness.       Recent altered mentation when ill. Mild dementia. History of cerebral atrophy and cerebrovascular disease. History of cervical spondylosis with spinal cord stenosis.  Hematological: Negative.   Psychiatric/Behavioral: Negative for agitation. The patient is nervous/anxious.        Hx depression    Filed Vitals:   07/09/14 1002  BP: 114/60  Pulse: 60  Temp: 97.5 F (36.4 C)  TempSrc: Oral  Weight: 154 lb (69.854 kg)  SpO2: 98%   Body mass index is 20.88 kg/(m^2).  Physical Exam  Constitutional: He is oriented to person, place, and time.    Thin, frail  HENT:  Head: Normocephalic and atraumatic.  Hearing loss  Eyes: Conjunctivae and EOM are normal. Pupils are equal, round, and reactive to light.  Neck: No JVD present. No tracheal deviation present. No thyromegaly present.  Cardiovascular: Normal rate, regular rhythm and normal heart sounds.  Exam reveals no gallop and no friction rub.   No murmur heard. Pulmonary/Chest: No respiratory distress. He has no wheezes. He has no rales. He exhibits no tenderness.  Abdominal: He exhibits no distension and no mass. There is no tenderness.  Musculoskeletal: Normal range of motion. He exhibits edema. He exhibits no tenderness.  Lurching, unstable gait.  Using four-wheel walker. Right knee pain.  Lymphadenopathy:    He has no cervical adenopathy.  Neurological: He is alert and oriented to person, place, and time. No cranial nerve deficit. Coordination abnormal.  Tremor bilaterally. Unstable gait.  Skin: No rash noted. No erythema. No pallor.  Psychiatric: He has a normal mood and affect. His behavior is normal. Thought content normal.     Labs reviewed: Nursing Home on 05/07/2014  Component Date Value Ref Range Status  . HM Colonoscopy 05/06/2012 Dr. Deatra Ina   Final     Assessment/Plan 1. Fall, initial encounter Patient's had multiple falls in the past and may have had an acceleration of falling over the last few weeks.  2. Balance problems Chronic issues likely related to cerebrovascular disease, cerebral atrophy, and possibly cervical spine stenosis.  3. Essential and other specified forms of tremor Recently started on Klonopin  4. Essential hypertension Controlled  5. Hammer toe of left foot Incidental finding without significant pain and unlikely to be related to his falls  6. Depression Stable  7. Anxiety state Chronic and stable  8. 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 Degeneration macular Unchanged

## 2014-07-11 LAB — TSH: TSH: 3.39 u[IU]/mL (ref 0.41–5.90)

## 2014-07-11 LAB — HEPATIC FUNCTION PANEL
ALT: 15 U/L (ref 10–40)
AST: 18 U/L (ref 14–40)
Alkaline Phosphatase: 49 U/L (ref 25–125)
Bilirubin, Total: 0.6 mg/dL

## 2014-07-11 LAB — BASIC METABOLIC PANEL
BUN: 34 mg/dL — AB (ref 4–21)
CREATININE: 1.4 mg/dL — AB (ref 0.6–1.3)
GLUCOSE: 80 mg/dL
Potassium: 4.2 mmol/L (ref 3.4–5.3)
SODIUM: 141 mmol/L (ref 137–147)

## 2014-07-11 LAB — LIPID PANEL
Cholesterol: 153 mg/dL (ref 0–200)
HDL: 52 mg/dL (ref 35–70)
LDL Cholesterol: 87 mg/dL
Triglycerides: 71 mg/dL (ref 40–160)

## 2014-07-12 ENCOUNTER — Encounter: Payer: Self-pay | Admitting: *Deleted

## 2014-07-16 ENCOUNTER — Telehealth: Payer: Self-pay | Admitting: Gastroenterology

## 2014-07-16 ENCOUNTER — Other Ambulatory Visit: Payer: Self-pay | Admitting: Neurology

## 2014-07-16 NOTE — Telephone Encounter (Signed)
Patient c/o constipation. Reports a history of fecal impaction. Appointment scheduled for 07/19/14

## 2014-07-16 NOTE — Telephone Encounter (Signed)
Has appt scheduled.

## 2014-07-17 ENCOUNTER — Telehealth: Payer: Self-pay | Admitting: Nurse Practitioner

## 2014-07-17 NOTE — Telephone Encounter (Signed)
I called and spoke to pt.  I told him that there is 07-11-14 lab results (BMP, LFT, Lipid panel) on EPIC.    I did not think he would need additional lab work needed, but this could change.  He verbalized understanding.

## 2014-07-17 NOTE — Telephone Encounter (Signed)
Patient called and wanted to know if he would be having blood work done tomorrow at his appt. Please call and advise.

## 2014-07-18 ENCOUNTER — Ambulatory Visit (INDEPENDENT_AMBULATORY_CARE_PROVIDER_SITE_OTHER): Payer: Medicare Other | Admitting: Nurse Practitioner

## 2014-07-18 ENCOUNTER — Encounter: Payer: Self-pay | Admitting: Nurse Practitioner

## 2014-07-18 VITALS — BP 152/73 | HR 54 | Ht 73.0 in | Wt 150.8 lb

## 2014-07-18 DIAGNOSIS — R262 Difficulty in walking, not elsewhere classified: Secondary | ICD-10-CM

## 2014-07-18 DIAGNOSIS — Z9181 History of falling: Secondary | ICD-10-CM

## 2014-07-18 DIAGNOSIS — M4802 Spinal stenosis, cervical region: Secondary | ICD-10-CM

## 2014-07-18 DIAGNOSIS — G250 Essential tremor: Secondary | ICD-10-CM

## 2014-07-18 DIAGNOSIS — W19XXXD Unspecified fall, subsequent encounter: Secondary | ICD-10-CM

## 2014-07-18 MED ORDER — TOPIRAMATE 25 MG PO TABS: ORAL_TABLET | ORAL | Status: DC

## 2014-07-18 NOTE — Patient Instructions (Signed)
Continue Topamax at current dose will refill for one year Given prescription for weighted utensils to obtain Use walker at all times for safe ambulation risk of falls Follow-up yearly and when necessary

## 2014-07-18 NOTE — Progress Notes (Signed)
I have read the note, and I agree with the clinical assessment and plan.  Luster Hechler KEITH   

## 2014-07-18 NOTE — Progress Notes (Signed)
[facility]  PATIENT: Jose Leach DOB: [birth year]   REASON FOR VISIT: Follow-up for essential tremor, gait abnormality, spinal stenosis HISTORY FROM: patient    HISTORY OF PRESENT ILLNESS:Mr. Jose Leach    81 year old right-handed white male with a history of an essential tremor returns for follow-up. He was last seen by Dr. Jannifer Franklin 04/18/2013. The patient has an associated gait disturbance. The patient was found to have evidence of spinal stenosis with cord impingement on a study done in 2013. The patient was referred to neurosurgery, but surgery was not recommended. The patient has continued to have a gait disturbance, and he will fall on occasion. The last fall was one week ago. The patient uses a walker for ambulation. the patient was recently diagnosed with macular degeneration. He likes to paint landscapes however his vision as well as his tremor affects his ability to do this. He denies any difficulty with being able to feed himself or perform his activities of daily living. He no longer drives.  The patient will occasionally have flexion jerking of the left leg, but he is able to control this by using a brace on the left foot. The patient is on Topamax for the tremor but ran out of the medication several days ago and is wanting a refill. He returns for reevaluation    REVIEW OF SYSTEMS: Full 14 system review of systems performed and notable only for those listed, all others are neg:  Constitutional: neg  Cardiovascular: neg Ear/Nose/Throat: neg  Skin: neg Eyes:  double vision, blurred vision, macular degeneration ,  Gastroitestinal: Urinary frequency Hematology/Lymphatic: neg  Endocrine: neg Musculoskeletal:neck pain Immunology: neg Neurological Tremors  Psychiatric  Anxiety depression Sleep : neg   ALLERGIES: Allergies  Allergen Reactions  . Latex Rash  . Tape Rash    Paper tape is ok to use     HOME MEDICATIONS: Outpatient Prescriptions  Prior to Visit  Medication Sig Dispense Refill  . aspirin 81 MG tablet Take 81 mg by mouth every morning.     Marland Kitchen atorvastatin (LIPITOR) 20 MG tablet Take one tablet by mouth once daily in the evening for cholesterol    . B Complex-C (B-COMPLEX WITH VITAMIN C) tablet Take 1 tablet by mouth every morning.    . bimatoprost (LUMIGAN) 0.01 % SOLN Place 1 drop into both eyes at bedtime.    Marland Kitchen buPROPion (WELLBUTRIN XL) 300 MG 24 hr tablet Take 300 mg by mouth every morning.     . clonazePAM (KLONOPIN) 0.5 MG tablet Take 0.5 tablets (0.25 mg total) by mouth 2 (two) times daily as needed (anxiety, agitation). 30 tablet 0  . Flaxseed, Linseed, (FLAX SEEDS PO) Take 1 tablet by mouth every morning.     . fluticasone (CUTIVATE) 0.05 % cream Apply twice daily to affected area    . ketoconazole (NIZORAL) 2 % cream     . Krill Oil 500 MG CAPS Take 500 mg by mouth every morning.     . lactose free nutrition (BOOST PLUS) LIQD Take 237 mLs by mouth 3 (three) times daily with meals. (Patient taking differently: Take 237 mLs by mouth 3 (three) times daily as needed. ) 90 Can 6  . levothyroxine (SYNTHROID) 50 MCG tablet Take 1/2 tablet by mouth on Sunday mornings and one tablet every other morning for thyroid 30 tablet 2  . Linaclotide (LINZESS) 290 MCG CAPS capsule Take 1 capsule (290 mcg total) by mouth daily. 30 capsule 1  . mirtazapine (REMERON) 45 MG  tablet Take 45 mg by mouth at bedtime.    . Multiple Vitamins-Minerals (ICAPS AREDS FORMULA PO) Take 1 capsule by mouth daily.     . polyethylene glycol (MIRALAX / GLYCOLAX) packet Take 17 g by mouth daily as needed for mild constipation.    . sertraline (ZOLOFT) 100 MG tablet Take two tablets by mouth once daily in the morning    . tamsulosin (FLOMAX) 0.4 MG CAPS capsule TAKE 1 CAPSULE (0.4 MG TOTAL) BY MOUTH DAILY.    Marland Kitchen testosterone ([name]) 50 MG/5GM GEL Place 5 g onto the skin every morning.     . brimonidine (ALPHAGAN) 0.15 % ophthalmic solution     .  cholecalciferol (VITAMIN D) 1000 UNITS tablet Take 3,000 Units by mouth.     Marland Kitchen L-Methylfolate 7.5 MG TABS Take 7.5 mg by mouth daily.    Marland Kitchen topiramate (TOPAMAX) 25 MG tablet TAKE ONE TABLET IN THE MORNING AND TWO TABLETS IN THE EVENING (Patient not taking: Reported on 07/18/2014) 90 tablet 0   No facility-administered medications prior to visit.    PAST MEDICAL HISTORY: Past Medical History  Diagnosis Date  . Migraines     Dr Jannifer Franklin  . Peripheral neuropathy   . Benign essential tremor   . Cervical stenosis of spine   . Anemia     B12 deficiency  . Testosterone deficiency     Dr Lawerance Bach, [place]  . Hypothyroidism   . Hyperlipidemia   . Depression     Dr Albertine Patricia  . History of shingles 2009  . H/O cardiovascular stress test     a. 02/2003 -> no ischemia/infarct.  Marland Kitchen Hx of echocardiogram     Echocardiogram 08/01/12: Mild LVH, EF 55-60%, normal wall motion.  Marland Kitchen PAD (peripheral artery disease)   . Carotid stenosis     s/p L CEA  . Fecal impaction 10/11/2012  . Macular edema   . Cervical spinal stenosis   . Hematoma of leg   . Acute urinary retention 01/28/2013  . AKI (acute kidney injury) 01/26/2013  . Altered mental status 09/09/2013  . ANXIETY 06/07/2007    Qualifier: Diagnosis of  By: Talbert Cage CMA ([place]), June    . ARTHRITIS 06/07/2007    Qualifier: Diagnosis of  By: Talbert Cage CMA ([place]), June    . Balance problems 04/05/2011  . Benign fibroma of prostate 10/11/2011  . Bilateral inguinal hernia 10/09/2012  . Bilateral leg edema 09/13/2012  . Bladder retention 02/05/2013  . Bradycardia, sinus 08/02/2012  . 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 CAROTID ARTERY DISEASE 03/31/2007    Qualifier: Diagnosis of  By: Linna Darner MD, Gwyndolyn Saxon    . Degeneration macular 11/02/2011  . Difficulty in walking(719.7) 04/05/2011  . ESOPHAGITIS 08/06/2002    Qualifier: Diagnosis of  By: Talbert Cage CMA ([place]), June    . Essential and other specified forms of tremor 04/18/2013  . Family history of colon cancer  07/24/2012  . Femoral hernia, bilateral s/p lap repair 01/16/2013 01/16/2013  . HAMMER TOE 04/23/2010    Qualifier: Diagnosis of  By: Linna Darner MD, Gwyndolyn Saxon    . Hip hematoma, right 09/09/2013  . HTN (hypertension) 08/22/2012  . Hydrocele 10/11/2011  . 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 MORTON'S NEUROMA, LEFT 08/22/2007    Qualifier: Diagnosis of  By: Linna Darner MD, Gwyndolyn Saxon    . Personal history of colonic polyps 01/05/2013    2014 2 mm adenomatous polyp   . PREMATURE ATRIAL CONTRACTIONS 03/14/2006    Qualifier: Diagnosis of  By: Linna Darner MD, Gwyndolyn Saxon    .  Pseudoaphakia 12/16/2011  . SPINAL STENOSIS 03/14/2006    Qualifier: Diagnosis of  By: Linna Darner MD, William   Cervical spine    . Unspecified vitamin D deficiency 08/06/2012  . Urge incontinence 10/09/2012  . Urinary tract infection, site not specified 02/13/2013  . Weakness 08/02/2012  . Glaucoma, compensated 03/21/2014  . Cataract, nuclear 03/21/2014  . Critical lower limb ischemia   . Falls     PAST SURGICAL HISTORY: Past Surgical History  Procedure Laterality Date  . Appendectomy  1941  . Lumbar laminectomy    . Carotid endarterectomy  2005     L  . Tonsillectomy and adenoidectomy    . Total hip arthroplasty  2005    L  . Cataract extraction Right   . Eye surgery Right 02/2012    retina  . Inguinal hernia repair Bilateral 01/16/2013    Procedure: LAPAROSCOPIC BILATERAL INGUINAL DIRECT AND INDIRECT, FEMORAL, AND OBTURATOR HERNIAS;  Surgeon: Adin Hector, MD;  Location: [facility];  Service: General;  Laterality: Bilateral;  . Insertion of mesh N/A 01/16/2013    Procedure: INSERTION OF MESH;  Surgeon: Adin Hector, MD;  Location: [facility];  Service: General;  Laterality: N/A;  . Breast cyst excision Left 01/16/2013    Procedure: MASS EXCISION AXILLA;  Surgeon: Adin Hector, MD;  Location: [facility];  Service: General;  Laterality: Left;  . Colonoscopy  2014    Dr. Deatra Ina    FAMILY HISTORY: Family History  Problem Relation Age of Onset  . Stroke  Mother 62  . Hypertension Mother   . Diabetes Mother   . Heart disease Mother   . Rectal cancer Father 98  . Heart disease Father     in 54s  . Cancer Father     colorectal  . Nephritis Sister   . Seizures Brother     died as child    SOCIAL HISTORY: History   Social History  . Marital Status: Married    Spouse Name: [name]  . Number of Children: 2  . Years of Education: N/A   Occupational History  . retired     Social History Main Topics  . Smoking status: Former Smoker -- 10 years    Types: Pipe, Cigars    Quit date: 02/16/1979  . Smokeless tobacco: Never Used     Comment: Quit about age 75   . Alcohol Use: Yes     Comment:  occasional  . Drug Use: No  . Sexual Activity: No   Other Topics Concern  . Not on file   Social History Narrative   Lives at [facility] since 2013   Married - [name]   Former smoker -stopped 1981   Alcohol -wine occasionally    Exercise - walking, Ne-step    POA   Walks with walker     PHYSICAL EXAM  Filed Vitals:   07/18/14 0955  BP: 152/73  Pulse: 54  Height: 6\' 1"  (1.854 m)  Weight: 150 lb 12.8 oz (68.402 kg)   Body mass index is 19.9 kg/(m^2). General: The patient is alert and cooperative at the time of the examination. Skin: no  edema of the ankles   Neurologic Exam Mental status: The patient is oriented x 3. Cranial nerves: Facial symmetry is present. Speech is normal, no aphasia or dysarthria is noted. Extraocular movements are full. Visual fields are full. A prominent vocal tremor is noted.  Motor: The patient has good strength in all 4 extremities.No focal  weakness Sensory examination: Soft touch sensation is symmetric on the face, arms, and legs.  Coordination: The patient has good finger-nose-finger and heel-to-shin bilaterally. The patient has an intention tremor with finger-nose-finger bilaterally. Gait and station: The patient has a slightly wide-based, unsteady gait. The patient uses a walker for  ambulation. Tandem gait was not attempted. Romberg is negative. No drift is seen. Reflexes: Deep tendon reflexes are symmetric.   DIAGNOSTIC DATA (LABS, IMAGING, TESTING) - I reviewed patient records, labs, notes, testing and imaging myself where available.      Component Value Date/Time   NA 141 07/11/2014   NA 139 09/10/2013 0139   K 4.2 07/11/2014   CL 107 09/10/2013 0139   CO2 20 09/10/2013 0139   GLUCOSE 85 09/10/2013 0139   BUN 34* 07/11/2014   BUN 34* 09/10/2013 0139   CREATININE 1.4* 07/11/2014   CREATININE 1.17 09/10/2013 0139   CALCIUM 8.3* 09/10/2013 0139   PROT 5.9* 09/10/2013 0139   ALBUMIN 2.8* 09/10/2013 0139   AST 18 07/11/2014   ALT 15 07/11/2014   ALKPHOS 49 07/11/2014   BILITOT 0.2* 09/10/2013 0139   GFRNONAA 57* 09/10/2013 0139   GFRAA 67* 09/10/2013 0139   Lab Results  Component Value Date   CHOL 153 07/11/2014   HDL 52 07/11/2014   LDLCALC 87 07/11/2014   LDLDIRECT 132.5 09/08/2012   TRIG 71 07/11/2014   CHOLHDL 4 09/08/2012    Lab Results  Component Value Date   TSH 3.39 07/11/2014      ASSESSMENT AND PLAN  81 y.o. year old male  has a past medical history of  1.benign essential tremor  2. Cervical spondylosis spinal stenosis   3 Gait instability   Continue Topamax at current dose will refill for one year Given prescription for weighted utensils to obtain from medical supply to assist with eating Use walker at all times for safe ambulation risk of falls Follow-up yearly and when necessary Dennie Bible, [facility], [facility], [facility] [address], [place], [place] [address] [phone]

## 2014-07-19 ENCOUNTER — Ambulatory Visit: Payer: Medicare Other | Admitting: Nurse Practitioner

## 2014-07-23 ENCOUNTER — Non-Acute Institutional Stay: Payer: Medicare Other | Admitting: Internal Medicine

## 2014-07-23 ENCOUNTER — Encounter: Payer: Self-pay | Admitting: Internal Medicine

## 2014-07-23 VITALS — BP 124/62 | HR 60 | Temp 97.5°F | Wt 152.0 lb

## 2014-07-23 DIAGNOSIS — G252 Other specified forms of tremor: Secondary | ICD-10-CM

## 2014-07-23 DIAGNOSIS — W19XXXD Unspecified fall, subsequent encounter: Secondary | ICD-10-CM | POA: Diagnosis not present

## 2014-07-23 DIAGNOSIS — R531 Weakness: Secondary | ICD-10-CM

## 2014-07-23 DIAGNOSIS — F411 Generalized anxiety disorder: Secondary | ICD-10-CM

## 2014-07-23 DIAGNOSIS — R251 Tremor, unspecified: Secondary | ICD-10-CM | POA: Diagnosis not present

## 2014-07-23 DIAGNOSIS — G250 Essential tremor: Secondary | ICD-10-CM

## 2014-07-23 DIAGNOSIS — L84 Corns and callosities: Secondary | ICD-10-CM

## 2014-07-23 NOTE — Progress Notes (Signed)
Patient ID: Jose Leach, male   DOB: [birth year], 81 y.o.   MRN: [id number]    [facility]     Place of Service: Clinic (12)     Allergies  Allergen Reactions  . Latex Rash  . Tape Rash    Paper tape is ok to use     Chief Complaint  Patient presents with  . Medical Management of Chronic Issues    blood pressure, thyroid    HPI:  He stopped the Klonopin since his last visit. He thought it was contributing to an anxiety increase. Feels better off of it.  Saw NP at [facility] 07/18/14. Recommended continuation of Topamax.  Saw podiatrist, Dr. Servando Salina. Trimmed calluses and removed nail from left great toe.  Painful callus on the ball of the right great toe.  Medications: Patient's Medications  New Prescriptions   No medications on file  Previous Medications   ASPIRIN 81 MG TABLET    Take 81 mg by mouth every morning.    ATORVASTATIN (LIPITOR) 20 MG TABLET    Take one tablet by mouth once daily in the evening for cholesterol   B COMPLEX-C (B-COMPLEX WITH VITAMIN C) TABLET    Take 1 tablet by mouth every morning.   BIMATOPROST (LUMIGAN) 0.01 % SOLN    Place 1 drop into both eyes at bedtime.   BRIMONIDINE (ALPHAGAN) 0.15 % OPHTHALMIC SOLUTION       BUPROPION (WELLBUTRIN XL) 300 MG 24 HR TABLET    Take 300 mg by mouth every morning.    CHOLECALCIFEROL (VITAMIN D) 1000 UNITS TABLET    Take 3,000 Units by mouth.    CLONAZEPAM (KLONOPIN) 0.5 MG TABLET    Take 0.5 tablets (0.25 mg total) by mouth 2 (two) times daily as needed (anxiety, agitation).   FLAXSEED, LINSEED, (FLAX SEEDS PO)    Take 1 tablet by mouth every morning.    FLUTICASONE (CUTIVATE) 0.05 % CREAM    Apply twice daily to affected area   KETOCONAZOLE (NIZORAL) 2 % CREAM       KRILL OIL 500 MG CAPS    Take 500 mg by mouth every morning.    L-METHYLFOLATE 7.5 MG TABS    Take 7.5 mg by mouth daily.   LACTOSE FREE NUTRITION (BOOST PLUS) LIQD    Take 237 mLs by mouth 3 (three) times daily with meals.   LEVOTHYROXINE (SYNTHROID) 50 MCG TABLET    Take 1/2 tablet by mouth on Sunday mornings and one tablet every other morning for thyroid   LINACLOTIDE (LINZESS) 290 MCG CAPS CAPSULE    Take 1 capsule (290 mcg total) by mouth daily.   MIRTAZAPINE (REMERON) 45 MG TABLET    Take 45 mg by mouth at bedtime.   MULTIPLE VITAMINS-MINERALS (ICAPS AREDS FORMULA PO)    Take 1 capsule by mouth daily.    POLYETHYLENE GLYCOL (MIRALAX / GLYCOLAX) PACKET    Take 17 g by mouth daily as needed for mild constipation.   SERTRALINE (ZOLOFT) 100 MG TABLET    Take two tablets by mouth once daily in the morning   TAMSULOSIN (FLOMAX) 0.4 MG CAPS CAPSULE    TAKE 1 CAPSULE (0.4 MG TOTAL) BY MOUTH DAILY.   TESTOSTERONE ([name]) 50 MG/5GM GEL    Place 5 g onto the skin every morning.    TOPIRAMATE (TOPAMAX) 25 MG TABLET    TAKE ONE TABLET IN THE MORNING AND TWO TABLETS IN THE EVENING  Modified Medications   No medications  on file  Discontinued Medications   No medications on file     Review of Systems  Constitutional: Positive for activity change and fatigue. Negative for fever, chills, diaphoresis, appetite change and unexpected weight change.       Complains of generalized weakness  HENT: Positive for hearing loss. Negative for sore throat, trouble swallowing and voice change.   Eyes: Positive for visual disturbance (corrective lenses).       History of wet macular degeneration  Respiratory: Positive for shortness of breath (On exertion).   Cardiovascular: Positive for leg swelling. Negative for chest pain and palpitations.  Gastrointestinal: Positive for constipation. Negative for nausea, vomiting, abdominal pain and abdominal distention.  Endocrine:       Hypothyroid. Testosterone deficiency.  Genitourinary: Positive for urgency, frequency, enuresis and difficulty urinating. Negative for dysuria, hematuria, flank pain and discharge.  Musculoskeletal: Positive for back pain, arthralgias and gait problem.        Unstable gait and hx falls. Knee pains, particularly in the right knee.  Skin: Negative.   Neurological: Positive for tremors, weakness and headaches. Negative for dizziness, seizures, syncope, facial asymmetry, light-headedness and numbness.       Recent altered mentation when ill. Mild dementia. History of cerebral atrophy and cerebrovascular disease. History of cervical spondylosis with spinal cord stenosis.  Hematological: Negative.   Psychiatric/Behavioral: Negative for agitation. The patient is nervous/anxious.        Hx depression    Filed Vitals:   07/23/14 1124  BP: 124/62  Pulse: 60  Temp: 97.5 F (36.4 C)  TempSrc: Oral  Weight: 152 lb (68.947 kg)   Body mass index is 20.06 kg/(m^2).  Physical Exam  Constitutional: He is oriented to person, place, and time.  Thin, frail  HENT:  Head: Normocephalic and atraumatic.  Hearing loss  Eyes: Conjunctivae and EOM are normal. Pupils are equal, round, and reactive to light.  Neck: No JVD present. No tracheal deviation present. No thyromegaly present.  Cardiovascular: Normal rate, regular rhythm and normal heart sounds.  Exam reveals no gallop and no friction rub.   No murmur heard. Pulmonary/Chest: No respiratory distress. He has no wheezes. He has no rales. He exhibits no tenderness.  Abdominal: He exhibits no distension and no mass. There is no tenderness.  Musculoskeletal: Normal range of motion. He exhibits edema. He exhibits no tenderness.  Lurching, unstable gait. Using four-wheel walker. Right knee pain.  Lymphadenopathy:    He has no cervical adenopathy.  Neurological: He is alert and oriented to person, place, and time. No cranial nerve deficit. Coordination abnormal.  Tremor bilaterally. Unstable gait.  Skin: No rash noted. No erythema. No pallor.  Psychiatric: He has a normal mood and affect. His behavior is normal. Thought content normal.     Labs reviewed: Abstract on 07/12/2014  Component Date Value Ref  Range Status  . Glucose 07/11/2014 80   Final  . BUN 07/11/2014 34* 4 - 21 mg/dL Final  . Creatinine 07/11/2014 1.4* 0.6 - 1.3 mg/dL Final  . Potassium 07/11/2014 4.2  3.4 - 5.3 mmol/L Final  . Sodium 07/11/2014 141  137 - 147 mmol/L Final  . Triglycerides 07/11/2014 71  40 - 160 mg/dL Final  . Cholesterol 07/11/2014 153  0 - 200 mg/dL Final  . HDL 07/11/2014 52  35 - 70 mg/dL Final  . LDL Cholesterol 07/11/2014 87   Final  . Alkaline Phosphatase 07/11/2014 49  25 - 125 U/L Final  . ALT 07/11/2014 15  10 - 40 U/L Final  . AST 07/11/2014 18  14 - 40 U/L Final  . Bilirubin, Total 07/11/2014 0.6   Final  . TSH 07/11/2014 3.39  0.41 - 5.90 uIU/mL Final  Nursing Home on 05/07/2014  Component Date Value Ref Range Status  . HM Colonoscopy 05/06/2012 Dr. Deatra Ina   Final     Assessment/Plan  Anxiety state: Off Klonopin. He feels that his anxiety is controllable on current medications.  Essential and other specified forms of tremor: No change by coming off Klonopin.  Fall, subsequent encounter: Patient had multiple falls prior to his last visit 2 weeks ago, but has had none since then. He remains very unstable when standing. He uses a Agricultural engineer.  Weakness: Generalized and possibly in need of further physical therapy.  Callus: Sole of the right foot. Evidence of prior ulceration under the callus. Has seen a podiatrist, Dr. Rosanne Ashing. He needed further debridement which was accomplished with scalpel in the office today. Patient tolerated procedure well and was much more comfortable walking following the debridement. There was a small amount of bleeding at the end of the procedure. I cauterized this with silver nitrite.

## 2014-08-06 ENCOUNTER — Encounter: Payer: Self-pay | Admitting: Internal Medicine

## 2014-08-09 ENCOUNTER — Other Ambulatory Visit: Payer: Self-pay | Admitting: Internal Medicine

## 2014-08-10 ENCOUNTER — Other Ambulatory Visit: Payer: Self-pay | Admitting: Internal Medicine

## 2014-08-10 ENCOUNTER — Other Ambulatory Visit: Payer: Self-pay | Admitting: Gastroenterology

## 2014-08-12 ENCOUNTER — Other Ambulatory Visit: Payer: Self-pay

## 2014-08-12 ENCOUNTER — Other Ambulatory Visit: Payer: Self-pay | Admitting: *Deleted

## 2014-08-12 MED ORDER — LINACLOTIDE 290 MCG PO CAPS: ORAL_CAPSULE | ORAL | Status: DC

## 2014-08-12 MED ORDER — LEVOTHYROXINE SODIUM 50 MCG PO TABS: ORAL_TABLET | ORAL | Status: DC

## 2014-08-12 NOTE — Telephone Encounter (Signed)
[employer] [facility] King 

## 2014-08-21 ENCOUNTER — Other Ambulatory Visit: Payer: Self-pay | Admitting: Neurology

## 2014-11-18 LAB — LIPID PANEL
Cholesterol: 189 mg/dL (ref 0–200)
HDL: 53 mg/dL (ref 35–70)
LDL Cholesterol: 117 mg/dL
Triglycerides: 94 mg/dL (ref 40–160)

## 2014-11-18 LAB — BASIC METABOLIC PANEL
BUN: 31 mg/dL — AB (ref 4–21)
Creatinine: 1.2 mg/dL (ref 0.6–1.3)
Glucose: 78 mg/dL
Potassium: 4.6 mmol/L (ref 3.4–5.3)
Sodium: 142 mmol/L (ref 137–147)

## 2014-11-18 LAB — TSH: TSH: 3.96 u[IU]/mL (ref 0.41–5.90)

## 2014-11-19 ENCOUNTER — Encounter: Payer: Self-pay | Admitting: *Deleted

## 2014-11-26 ENCOUNTER — Encounter: Payer: Medicare Other | Admitting: Internal Medicine

## 2014-12-02 ENCOUNTER — Encounter (HOSPITAL_BASED_OUTPATIENT_CLINIC_OR_DEPARTMENT_OTHER): Payer: Self-pay

## 2014-12-02 ENCOUNTER — Emergency Department (HOSPITAL_BASED_OUTPATIENT_CLINIC_OR_DEPARTMENT_OTHER)
Admission: EM | Admit: 2014-12-02 | Discharge: 2014-12-02 | Disposition: A | Discharge status: Home / Self Care | Payer: Medicare Other | Attending: Emergency Medicine | Admitting: Emergency Medicine

## 2014-12-02 ENCOUNTER — Emergency Department (HOSPITAL_BASED_OUTPATIENT_CLINIC_OR_DEPARTMENT_OTHER): Payer: Medicare Other

## 2014-12-02 DIAGNOSIS — E039 Hypothyroidism, unspecified: Secondary | ICD-10-CM | POA: Diagnosis not present

## 2014-12-02 DIAGNOSIS — M1990 Unspecified osteoarthritis, unspecified site: Secondary | ICD-10-CM | POA: Diagnosis not present

## 2014-12-02 DIAGNOSIS — R2681 Unsteadiness on feet: Secondary | ICD-10-CM | POA: Diagnosis not present

## 2014-12-02 DIAGNOSIS — Z86010 Personal history of colonic polyps: Secondary | ICD-10-CM | POA: Diagnosis not present

## 2014-12-02 DIAGNOSIS — G43909 Migraine, unspecified, not intractable, without status migrainosus: Secondary | ICD-10-CM | POA: Diagnosis not present

## 2014-12-02 DIAGNOSIS — Y998 Other external cause status: Secondary | ICD-10-CM | POA: Diagnosis not present

## 2014-12-02 DIAGNOSIS — Z91040 Latex allergy status: Secondary | ICD-10-CM | POA: Diagnosis not present

## 2014-12-02 DIAGNOSIS — Y9289 Other specified places as the place of occurrence of the external cause: Secondary | ICD-10-CM | POA: Diagnosis not present

## 2014-12-02 DIAGNOSIS — W01190A Fall on same level from slipping, tripping and stumbling with subsequent striking against furniture, initial encounter: Secondary | ICD-10-CM | POA: Diagnosis not present

## 2014-12-02 DIAGNOSIS — Z862 Personal history of diseases of the blood and blood-forming organs and certain disorders involving the immune mechanism: Secondary | ICD-10-CM | POA: Diagnosis not present

## 2014-12-02 DIAGNOSIS — Z87440 Personal history of urinary (tract) infections: Secondary | ICD-10-CM | POA: Diagnosis not present

## 2014-12-02 DIAGNOSIS — Z7982 Long term (current) use of aspirin: Secondary | ICD-10-CM | POA: Diagnosis not present

## 2014-12-02 DIAGNOSIS — Z8719 Personal history of other diseases of the digestive system: Secondary | ICD-10-CM | POA: Insufficient documentation

## 2014-12-02 DIAGNOSIS — E785 Hyperlipidemia, unspecified: Secondary | ICD-10-CM | POA: Insufficient documentation

## 2014-12-02 DIAGNOSIS — Z79899 Other long term (current) drug therapy: Secondary | ICD-10-CM | POA: Diagnosis not present

## 2014-12-02 DIAGNOSIS — L89151 Pressure ulcer of sacral region, stage 1: Secondary | ICD-10-CM | POA: Diagnosis not present

## 2014-12-02 DIAGNOSIS — S0990XA Unspecified injury of head, initial encounter: Secondary | ICD-10-CM | POA: Diagnosis not present

## 2014-12-02 DIAGNOSIS — Z86018 Personal history of other benign neoplasm: Secondary | ICD-10-CM | POA: Insufficient documentation

## 2014-12-02 DIAGNOSIS — Z87448 Personal history of other diseases of urinary system: Secondary | ICD-10-CM | POA: Diagnosis not present

## 2014-12-02 DIAGNOSIS — F329 Major depressive disorder, single episode, unspecified: Secondary | ICD-10-CM | POA: Diagnosis not present

## 2014-12-02 DIAGNOSIS — Z8619 Personal history of other infectious and parasitic diseases: Secondary | ICD-10-CM | POA: Insufficient documentation

## 2014-12-02 DIAGNOSIS — Y9301 Activity, walking, marching and hiking: Secondary | ICD-10-CM | POA: Insufficient documentation

## 2014-12-02 DIAGNOSIS — F419 Anxiety disorder, unspecified: Secondary | ICD-10-CM | POA: Insufficient documentation

## 2014-12-02 DIAGNOSIS — H539 Unspecified visual disturbance: Secondary | ICD-10-CM

## 2014-12-02 DIAGNOSIS — I10 Essential (primary) hypertension: Secondary | ICD-10-CM | POA: Insufficient documentation

## 2014-12-02 DIAGNOSIS — H538 Other visual disturbances: Secondary | ICD-10-CM | POA: Insufficient documentation

## 2014-12-02 DIAGNOSIS — B372 Candidiasis of skin and nail: Secondary | ICD-10-CM | POA: Diagnosis not present

## 2014-12-02 DIAGNOSIS — Z87891 Personal history of nicotine dependence: Secondary | ICD-10-CM | POA: Diagnosis not present

## 2014-12-02 DIAGNOSIS — R001 Bradycardia, unspecified: Secondary | ICD-10-CM | POA: Insufficient documentation

## 2014-12-02 HISTORY — DX: Unspecified macular degeneration: H35.30

## 2014-12-02 LAB — CBC WITH DIFFERENTIAL/PLATELET
BASOS PCT: 0 %
Basophils Absolute: 0 10*3/uL (ref 0.0–0.1)
EOS PCT: 3 %
Eosinophils Absolute: 0.2 10*3/uL (ref 0.0–0.7)
HCT: 44.1 % (ref 39.0–52.0)
HEMOGLOBIN: 14.3 g/dL (ref 13.0–17.0)
LYMPHS PCT: 17 %
Lymphs Abs: 1 10*3/uL (ref 0.7–4.0)
MCH: 30.2 pg (ref 26.0–34.0)
MCHC: 32.4 g/dL (ref 30.0–36.0)
MCV: 93 fL (ref 78.0–100.0)
MONO ABS: 0.4 10*3/uL (ref 0.1–1.0)
Monocytes Relative: 8 %
NEUTROS ABS: 4 10*3/uL (ref 1.7–7.7)
NEUTROS PCT: 72 %
Platelets: 116 10*3/uL — ABNORMAL LOW (ref 150–400)
RBC: 4.74 MIL/uL (ref 4.22–5.81)
RDW: 13.3 % (ref 11.5–15.5)
WBC: 5.6 10*3/uL (ref 4.0–10.5)

## 2014-12-02 LAB — BASIC METABOLIC PANEL
Anion gap: 5 (ref 5–15)
BUN: 35 mg/dL — AB (ref 6–20)
CHLORIDE: 110 mmol/L (ref 101–111)
CO2: 25 mmol/L (ref 22–32)
CREATININE: 1.27 mg/dL — AB (ref 0.61–1.24)
Calcium: 8.8 mg/dL — ABNORMAL LOW (ref 8.9–10.3)
GFR calc Af Amer: 60 mL/min — ABNORMAL LOW (ref >60)
GFR calc non Af Amer: 52 mL/min — ABNORMAL LOW (ref >60)
GLUCOSE: 85 mg/dL (ref 65–99)
POTASSIUM: 4.6 mmol/L (ref 3.5–5.1)
Sodium: 140 mmol/L (ref 135–145)

## 2014-12-02 MED ORDER — TETRACAINE HCL 0.5 % OP SOLN
1.0000 [drp] | Freq: Once | OPHTHALMIC | Status: AC
  Administered 2014-12-02: 1 [drp] via OPHTHALMIC
  Filled 2014-12-02: qty 2

## 2014-12-02 NOTE — ED Notes (Signed)
Supplies placed at bedside for md exam.

## 2014-12-02 NOTE — ED Provider Notes (Signed)
CSN: [id number]     Arrival date & time 12/02/14  1229 History   First MD Initiated Contact with Patient 12/02/14 1246     Chief Complaint  Patient presents with  . Head Injury     (Consider location/radiation/quality/duration/timing/severity/associated sxs/prior Treatment) HPI Comments: Patient is an 81 year old male with a history of hyperlipidemia, essential tremor, migraines, macular degeneration, glaucoma, acute kidney injury who presents today for persistent visual changes. He had a fall 3 days ago where he lost his balance and hit his forehead on a dresser. He denies any loss of consciousness before or after the fall. He denies any symptoms of dizziness, chest pain, palpitations or shortness of breath. He states he fell because he was not using his walker. He was fine on Saturday and then yesterday after waking up from a nap noticed his vision was off. He denies headache or eye pain. No eye drainage. The blurriness is in both eyes slightly worse on the left. It does not change if he covers one eye. He denies seeing double or loss of vision. He describes it as things seeming distorted light peoples noses are longer in their faces are more oval. He saw his eye doctor a proximally 2 weeks ago and at that time had normal pressure of his eyes. 1 drop was stopped they cannot recall the name of it. He denies any chest pain, shortness of breath, abdominal pain, nausea, vomiting. No changes in diet. One episode of diarrhea yesterday but none since.  Patient is a 81 y.o. male presenting with head injury. The history is provided by the patient.  Head Injury Location:  Frontal Time since incident:  3 days Mechanism of injury: fall   Mechanism of injury comment:  3 days ago patient was walking to get a shirt out of his closet it did not use his walker. He lost his balance and fell forward hitting his for head on a dresser. He was initially fine after the fall and denies any LOC  Pain details:     Severity:  No pain   Timing:  Constant Chronicity:  New Relieved by:  Nothing Worsened by:  Nothing tried Ineffective treatments:  None tried Associated symptoms: blurred vision   Associated symptoms: no disorientation, no double vision, no focal weakness, no headaches, no hearing loss, no loss of consciousness, no nausea, no neck pain, no numbness, no tinnitus and no vomiting   Risk factors: being elderly     Past Medical History  Diagnosis Date  . Migraines     Dr Jannifer Franklin  . Peripheral neuropathy ([place])   . Benign essential tremor   . Cervical stenosis of spine   . Anemia     B12 deficiency  . Testosterone deficiency     Dr Lawerance Bach, [facility]  . Hypothyroidism   . Hyperlipidemia   . Depression     Dr Albertine Patricia  . History of shingles 2009  . H/O cardiovascular stress test     a. 02/2003 -> no ischemia/infarct.  Marland Kitchen Hx of echocardiogram     Echocardiogram 08/01/12: Mild LVH, EF 55-60%, normal wall motion.  Marland Kitchen PAD (peripheral artery disease) ([place])   . Carotid stenosis     s/p L CEA  . Fecal impaction ([place]) 10/11/2012  . Macular edema   . Cervical spinal stenosis   . Hematoma of leg   . Acute urinary retention 01/28/2013  . AKI (acute kidney injury) ([place]) 01/26/2013  . Altered mental status 09/09/2013  . ANXIETY 06/07/2007  Qualifier: Diagnosis of  By: Talbert Cage CMA Deborra Medina), June    . ARTHRITIS 06/07/2007    Qualifier: Diagnosis of  By: Talbert Cage CMA ([place]), June    . Balance problems 04/05/2011  . Benign fibroma of prostate 10/11/2011  . Bilateral inguinal hernia 10/09/2012  . Bilateral leg edema 09/13/2012  . Bladder retention 02/05/2013  . Bradycardia, sinus 08/02/2012  . 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 CAROTID ARTERY DISEASE 03/31/2007    Qualifier: Diagnosis of  By: Linna Darner MD, Gwyndolyn Saxon    . Degeneration macular 11/02/2011  . Difficulty in walking(719.7) 04/05/2011  . ESOPHAGITIS 08/06/2002    Qualifier: Diagnosis of  By: Talbert Cage CMA ([place]), June    . Essential and other  specified forms of tremor 04/18/2013  . Family history of colon cancer 07/24/2012  . Femoral hernia, bilateral s/p lap repair 01/16/2013 01/16/2013  . HAMMER TOE 04/23/2010    Qualifier: Diagnosis of  By: Linna Darner MD, Gwyndolyn Saxon    . Hip hematoma, right 09/09/2013  . HTN (hypertension) 08/22/2012  . Hydrocele 10/11/2011  . 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 MORTON'S NEUROMA, LEFT 08/22/2007    Qualifier: Diagnosis of  By: Linna Darner MD, Gwyndolyn Saxon    . Personal history of colonic polyps 01/05/2013    2014 2 mm adenomatous polyp   . PREMATURE ATRIAL CONTRACTIONS 03/14/2006    Qualifier: Diagnosis of  By: Linna Darner MD, Gwendolyn Lima 12/16/2011  . SPINAL STENOSIS 03/14/2006    Qualifier: Diagnosis of  By: Linna Darner MD, William   Cervical spine    . Unspecified vitamin D deficiency 08/06/2012  . Urge incontinence 10/09/2012  . Urinary tract infection, site not specified 02/13/2013  . Weakness 08/02/2012  . Glaucoma, compensated 03/21/2014  . Cataract, nuclear 03/21/2014  . Critical lower limb ischemia   . Falls   . Macular degeneration disease    Past Surgical History  Procedure Laterality Date  . Appendectomy  1941  . Lumbar laminectomy    . Carotid endarterectomy  2005     L  . Tonsillectomy and adenoidectomy    . Total hip arthroplasty  2005    L  . Cataract extraction Right   . Eye surgery Right 02/2012    retina  . Inguinal hernia repair Bilateral 01/16/2013    Procedure: LAPAROSCOPIC BILATERAL INGUINAL DIRECT AND INDIRECT, FEMORAL, AND OBTURATOR HERNIAS;  Surgeon: Adin Hector, MD;  Location: [place];  Service: General;  Laterality: Bilateral;  . Insertion of mesh N/A 01/16/2013    Procedure: INSERTION OF MESH;  Surgeon: Adin Hector, MD;  Location: [place];  Service: General;  Laterality: N/A;  . Breast cyst excision Left 01/16/2013    Procedure: MASS EXCISION AXILLA;  Surgeon: Adin Hector, MD;  Location: [place];  Service: General;  Laterality: Left;  . Colonoscopy  2014    Dr. Deatra Ina    Family History  Problem Relation Age of Onset  . Stroke Mother 58  . Hypertension Mother   . Diabetes Mother   . Heart disease Mother   . Rectal cancer Father 35  . Heart disease Father     in 83s  . Cancer Father     colorectal  . Nephritis Sister   . Seizures Brother     died as child   Social History  Substance Use Topics  . Smoking status: Former Smoker -- 10 years    Types: Pipe, Cigars    Quit date: 02/16/1979  . Smokeless tobacco: Never Used  Comment: Quit about age 30   . Alcohol Use: 2.4 oz/week    4 Standard drinks or equivalent per week     Comment:  occasional    Review of Systems  HENT: Negative for hearing loss and tinnitus.   Eyes: Positive for blurred vision. Negative for double vision.  Gastrointestinal: Negative for nausea and vomiting.  Genitourinary:       A rash in the gluteal cleft that he thinks is yeast. Also states that he has some skin breakdown on his buttocks where he fell. Currently he is not putting anything on this  Musculoskeletal: Negative for neck pain.  Neurological: Negative for focal weakness, loss of consciousness, numbness and headaches.  All other systems reviewed and are negative.     Allergies  Latex and Tape  Home Medications   Prior to Admission medications   Medication Sig Start Date End Date Taking? Authorizing Provider  aspirin 81 MG tablet Take 81 mg by mouth every morning.     Historical Provider, MD  atorvastatin (LIPITOR) 20 MG tablet Take one tablet by mouth once daily in the evening for cholesterol 09/14/13   Estill Dooms, MD  B Complex-C (B-COMPLEX WITH VITAMIN C) tablet Take 1 tablet by mouth every morning.    Historical Provider, MD  bimatoprost (LUMIGAN) 0.01 % SOLN Place 1 drop into both eyes at bedtime.    Historical Provider, MD  brimonidine (ALPHAGAN) 0.15 % ophthalmic solution  06/25/14   Historical Provider, MD  buPROPion (WELLBUTRIN XL) 300 MG 24 hr tablet Take 300 mg by mouth every morning.      Historical Provider, MD  cholecalciferol (VITAMIN D) 1000 UNITS tablet Take 3,000 Units by mouth.     Historical Provider, MD  clonazePAM (KLONOPIN) 0.5 MG tablet Take 0.5 tablets (0.25 mg total) by mouth 2 (two) times daily as needed (anxiety, agitation). 09/11/13   Bobby Rumpf [place], PA-C  Flaxseed, Linseed, (FLAX SEEDS PO) Take 1 tablet by mouth every morning.     Historical Provider, MD  fluticasone (CUTIVATE) 0.05 % cream Apply twice daily to affected area 01/15/14   Historical Provider, MD  ketoconazole (NIZORAL) 2 % cream  06/30/14   Historical Provider, MD  Javier Docker Oil 500 MG CAPS Take 500 mg by mouth every morning.     Historical Provider, MD  L-Methylfolate 7.5 MG TABS Take 7.5 mg by mouth daily.    Historical Provider, MD  lactose free nutrition (BOOST PLUS) LIQD Take 237 mLs by mouth 3 (three) times daily with meals. Patient taking differently: Take 237 mLs by mouth 3 (three) times daily as needed.  09/11/13   Melton Alar, PA-C  levothyroxine (SYNTHROID) 50 MCG tablet Take 1/2 tablet by mouth on Sunday mornings and Take one tablet by mouth every other morning for thryoid. 08/12/14   Estill Dooms, MD  Linaclotide Rolan Lipa) 290 MCG CAPS capsule Take one capsule by mouth once daily 08/12/14   Estill Dooms, MD  mirtazapine (REMERON) 45 MG tablet Take 45 mg by mouth at bedtime.    Historical Provider, MD  Multiple Vitamins-Minerals (ICAPS AREDS FORMULA PO) Take 1 capsule by mouth daily.     Historical Provider, MD  polyethylene glycol (MIRALAX / GLYCOLAX) packet Take 17 g by mouth daily as needed for mild constipation.    Historical Provider, MD  sertraline (ZOLOFT) 100 MG tablet Take two tablets by mouth once daily in the morning    Historical Provider, MD  SYNTHROID 50 MCG tablet TAKE  1/2 TABLET BY MOUTH ON SUNDAY MORNINGS AND ONE TABLET EVERY OTHER MORNING FOR THYROID 08/12/14   Estill Dooms, MD  tamsulosin (FLOMAX) 0.4 MG CAPS capsule TAKE 1 CAPSULE (0.4 MG TOTAL) BY MOUTH DAILY. 05/24/14    Historical Provider, MD  testosterone ([name]) 50 MG/5GM GEL Place 5 g onto the skin every morning.     Historical Provider, MD  topiramate (TOPAMAX) 25 MG tablet TAKE ONE TABLET IN THE MORNING AND TWO TABLETS IN THE EVENING 07/18/14   Dennie Bible, NP  topiramate (TOPAMAX) 25 MG tablet TAKE ONE TABLET IN THE MORNING AND TWO TABLETS IN THE EVENING 08/21/14   [name], MD   BP 164/56 mmHg  Pulse 44  Temp(Src) 98.4 F (36.9 C) (Oral)  Resp 20  Ht 6' (1.829 m)  Wt 150 lb (68.04 kg)  BMI 20.34 kg/m2  SpO2 99% Physical Exam  Constitutional: He is oriented to person, place, and time. He appears well-developed and well-nourished. No distress.  HENT:  Head: Normocephalic and atraumatic.  Right Ear: Tympanic membrane normal.  Left Ear: Tympanic membrane normal.  Mouth/Throat: Oropharynx is clear and moist.  Eyes: Conjunctivae and EOM are normal. Pupils are equal, round, and reactive to light.  Intraocular pressure on the right is 14 and interocular pressure on the left is 17. Pupils are equal round and reactive to light. Extraocular movements are intact without any evidence of nerve palsy  Neck: Normal range of motion. Neck supple.  Cardiovascular: Regular rhythm and intact distal pulses.  Bradycardia present.   No murmur heard. Pulmonary/Chest: Effort normal and breath sounds normal. No respiratory distress. He has no wheezes. He has no rales.  Abdominal: Soft. He exhibits no distension. There is no tenderness. There is no rebound and no guarding.  Genitourinary:  Candidal rash present in the gluteal cleft. Stage I decubitus ulcers present over SI joint bilaterally  Musculoskeletal: Normal range of motion. He exhibits no edema or tenderness.  Neurological: He is alert and oriented to person, place, and time. He has normal strength. No cranial nerve deficit or sensory deficit. Coordination normal.  Skin: Skin is warm and dry. No rash noted. No erythema.  Psychiatric: He has a  normal mood and affect. His behavior is normal.  Nursing note and vitals reviewed.   ED Course  Procedures (including critical care time) Labs Review Labs Reviewed  CBC WITH DIFFERENTIAL/PLATELET - Abnormal; Notable for the following:    Platelets 116 (*)    All other components within normal limits  BASIC METABOLIC PANEL - Abnormal; Notable for the following:    BUN 35 (*)    Creatinine, Ser 1.27 (*)    Calcium 8.8 (*)    GFR calc non Af Amer 52 (*)    GFR calc Af Amer 60 (*)    All other components within normal limits    Imaging Review Ct Head Wo Contrast  12/02/2014  CLINICAL DATA:  81 year old male with a history of fall. New vision changes. EXAM: CT HEAD WITHOUT CONTRAST TECHNIQUE: Contiguous axial images were obtained from the base of the skull through the vertex without intravenous contrast. COMPARISON:  09/09/2013, 09/05/2013, MRI 09/05/2013 FINDINGS: Unremarkable appearance of the calvarium without acute fracture or aggressive lesion. Unremarkable appearance of the scalp soft tissues. Unremarkable appearance of the bilateral orbits. Right lens extraction Mastoid air cells are clear. No significant paranasal sinus disease No acute intracranial hemorrhage, midline shift, or mass effect. Gray-white differentiation is maintained, without CT evidence of acute  ischemia. Unremarkable configuration of the ventricles. Intracranial atherosclerotic calcifications. Focal hypodensities the bilateral basal ganglia, and periventricular regions. IMPRESSION: No CT evidence of acute intracranial abnormality. Changes of chronic microvascular ischemic disease with intracranial atherosclerosis, similar to comparison studies. Signed, Dulcy Fanny. Earleen Newport, DO [facility] Radiology Electronically Signed   By: Corrie Mckusick D.O.   On: 12/02/2014 14:01   I have personally reviewed and evaluated these images and lab results as part of my medical  decision-making.   EKG Interpretation   Date/Time:  Monday December 02 2014 12:51:41 EDT Ventricular Rate:  43 PR Interval:  154 QRS Duration: 102 QT Interval:  448 QTC Calculation: 378 R Axis:   43 Text Interpretation:  Marked sinus bradycardia Incomplete right bundle  branch block No significant change since last tracing Confirmed by  Maryan Rued  MD, Loree Fee ([id number]) on 12/02/2014 12:50:56 PM      MDM   Final diagnoses:  Visual changes  Head injury, initial encounter  Unsteady gait  Decubitus ulcer of sacral area, stage I    Patient is an 81 year old male with multiple medical problems and on multiple medications who presents today after a fall and 1 days ago for changes in his vision. He denies any loss of vision or double vision but states it seems a little more blurry. He does have a history of glaucoma and macular degeneration. He also states that recently he stopped using one of his drops per instruction by his eye doctor. He saw them 2 weeks ago and had normal pressures in his eyes and things were going well until yesterday. He denies any unilateral weakness or numbness. No chest, cardiac or respiratory complaints. No nausea vomiting or abdominal pain. His diet has been unchanged and he is eating normally. No syncope or dizziness.  On exam patient has normal strength and motor function. No evidence of fluid overload and heart and lungs sound clear. He does have evidence of Candida in the buttocks as well as stage I decubitus wounds. On eye exam is extraocular movements are intact without any evidence of nerve palsy. He has no visual field cuts concerning for stroke in his head CT is negative for an acute bleed. Labs are without acute findings.  An interocular pressure is 14 on the right and 17 on the left.  At this time unclear what patient's visual disturbance is coming from. Does not appear to have worsening glaucoma this time and no strokelike symptoms concerning for his  symptoms. May be related to recent DC of an eyedrop he was using. Recommended patient follow-up with his ophthalmologist. Also ordered home health to evaluate for PTOT for recent falls and gait disturbance as well as an Therapist, sports for wound care.  Blanchie Dessert, MD 12/02/14 [phone]

## 2014-12-02 NOTE — ED Notes (Signed)
MD at bedside. 

## 2014-12-02 NOTE — ED Notes (Signed)
Pt states he lost balance and fell Friday-struck head on dresser-c/o visual changes started yesterday-pt using own walker-brought to tx area via w/c-was able to stand an pivot but is unasteady-pt also c/o "yeast infection" to buttocks

## 2014-12-02 NOTE — ED Notes (Signed)
ALLEVYN Gentle Border 12.5cm x 12.5cm dressing applied to sacral stage 2 ulcer.

## 2014-12-02 NOTE — Discharge Instructions (Signed)
Need to have dressing changed daily and neosporin applied

## 2014-12-02 NOTE — ED Notes (Signed)
I spoke with pts nurse, Helene Kelp, at the [facility].  Explained to her that pt has breakdown on his sacral area and will need followup for long term care.  She will have pt f/u with his PMD for determination of long term needs and possible PT/OT.

## 2014-12-04 ENCOUNTER — Ambulatory Visit: Payer: Self-pay | Admitting: Internal Medicine

## 2014-12-24 ENCOUNTER — Encounter: Payer: Medicare Other | Attending: Surgery | Admitting: Surgery

## 2014-12-24 DIAGNOSIS — M4802 Spinal stenosis, cervical region: Secondary | ICD-10-CM | POA: Insufficient documentation

## 2014-12-24 DIAGNOSIS — I2510 Atherosclerotic heart disease of native coronary artery without angina pectoris: Secondary | ICD-10-CM | POA: Diagnosis not present

## 2014-12-24 DIAGNOSIS — Z87891 Personal history of nicotine dependence: Secondary | ICD-10-CM | POA: Diagnosis not present

## 2014-12-24 DIAGNOSIS — I739 Peripheral vascular disease, unspecified: Secondary | ICD-10-CM | POA: Insufficient documentation

## 2014-12-24 DIAGNOSIS — B369 Superficial mycosis, unspecified: Secondary | ICD-10-CM | POA: Diagnosis present

## 2014-12-24 DIAGNOSIS — G629 Polyneuropathy, unspecified: Secondary | ICD-10-CM | POA: Insufficient documentation

## 2014-12-24 DIAGNOSIS — L84 Corns and callosities: Secondary | ICD-10-CM | POA: Diagnosis not present

## 2014-12-24 NOTE — Progress Notes (Addendum)
VIRGEL, HARO ([id number]) Visit Report for 12/24/2014 Chief Complaint Document Details EKAM, BESSON 12/24/2014 12:45 Patient Name: Date of Service: L. PM Medical Record Patient Account Number: [id number] Number: Treating RN: Montey Hora Date of Birth/Sex: October 06, 1932 (81 y.o. Male) Other Clinician: Primary Care Treating GREEN, [place] Physician: Physician/Extender: Referring Physician: Matthew Folks in Treatment: 0 Information Obtained from: Patient Chief Complaint Patient presents to the wound care center for a consult due non healing wound. he has had a ulceration on his sacral area on and off for the last 2 months and a large callosity on his left forefoot plantar aspect for several months. Electronic Signature(s) Signed: 12/24/2014 2:59:41 PM By: Christin Fudge MD, FACS Entered By: Christin Fudge on 12/24/2014 14:59:41 [name] ([id number]) -------------------------------------------------------------------------------- HPI Details CLEOPHUS, MENDONSA 12/24/2014 12:45 Patient Name: Date of Service: L. PM Medical Record Patient Account Number: [id number] Number: Treating RN: Montey Hora Date of Birth/Sex: [birth year] (81 y.o. Male) Other Clinician: Primary Care Treating GREEN, Toma Aran, [place] Physician: Physician/Extender: Referring Physician: Matthew Folks in Treatment: 0 History of Present Illness Location: left sacral area has a redness and ulceration and also his left foot has a callosity which is painful. Quality: Patient reports experiencing a dull pain to affected area(s). Severity: Patient states wound (s) are getting better. Duration: Patient has had the wound for > 3 months prior to seeking treatment at the wound center Timing: Pain in wound is Intermittent (comes and goes Context: The wound appeared gradually over time Modifying Factors: Consults to this date include: he has  seen podiatry and also seen a dermatologist Associated Signs and Symptoms: Patient reports having difficulty standing for long periods. HPI Description: 81 year old gentleman who is being seen by his dermatologist Dr. Rozann Lesches who has sent him here to [place] for an opinion regarding a sacral wound which has fungal infection too. He also has a large on on the left lower extremity which has been treated by his podiatrist Dr. Servando Salina. His past medical history is significant for migraines, peripheral neuropathy, cervical stenosis of the spine, anemia,carotid artery disease with the mild LVH and ejection fraction of 60%, peripheral arterial disease, carotid stenosis status post left artery endarterectomy, bilateral inguinal hernia,status post appendectomy, lumbar laminectomy, left carotid endarterectomy, left hip arthroplasty, and laparoscopic inguinal hernia repair. is also a former smoker quit 10 years ago. Electronic Signature(s) Signed: 12/24/2014 3:15:43 PM By: Christin Fudge MD, FACS Previous Signature: 12/24/2014 3:00:52 PM Version By: Christin Fudge MD, FACS Previous Signature: 12/24/2014 1:03:43 PM Version By: Christin Fudge MD, FACS Entered By: Christin Fudge on 12/24/2014 15:15:42 [name] ([id number]) -------------------------------------------------------------------------------- Physical Exam Details ABDULRAHEEM, PINEO 12/24/2014 12:45 Patient Name: Date of Service: L. PM Medical Record Patient Account Number: [id number] Number: Treating RN: Montey Hora Date of Birth/Sex: Sep 25, 1932 (81 y.o. Male) Other Clinician: [place] Physician: Physician/Extender: Referring Physician: Rinaldo Ratel Weeks in Treatment: 0 Constitutional . Pulse regular. Respirations normal and unlabored. Afebrile. . Eyes Nonicteric. Reactive to light. Ears, Nose, Mouth, and Throat Lips, teeth, and gums WNL.Marland Kitchen Moist mucosa without lesions  . Neck supple and nontender. No palpable supraclavicular or cervical adenopathy. Normal sized without goiter. Respiratory WNL. No retractions.. Cardiovascular Pedal Pulses WNL. No clubbing, cyanosis or edema. Gastrointestinal (GI) Abdomen without masses or tenderness.. No liver or spleen enlargement or tenderness.. Lymphatic No adneopathy. No adenopathy. No adenopathy. Musculoskeletal Adexa without tenderness or enlargement.. Digits and nails w/o clubbing, cyanosis, infection, petechiae, ischemia, or inflammatory conditions.Marland Kitchen  Integumentary (Hair, Skin) No suspicious lesions. No crepitus or fluctuance. No peri-wound warmth or erythema. No masses.Marland Kitchen Psychiatric Judgement and insight Intact.. No evidence of depression, anxiety, or agitation.. Notes On the sacral area to the left of the midline he has a patch of fungal infection but there is no open ulceration. On his left forefoot in the region of the plantar aspect he has a large callosity in the region of the first and second metatarsal head. There is no open wound and this is being treated by his podiatrist. Electronic Signature(s) Signed: 12/24/2014 3:16:56 PM By: Christin Fudge MD, FACS Entered By: Christin Fudge on 12/24/2014 15:16:56 [name] ([id number]) PAXTEN, APPELT ([id number]) -------------------------------------------------------------------------------- Physician Orders Details HAYWARD, RYLANDER 12/24/2014 12:45 Patient Name: Date of Service: L. PM Medical Record Patient Account Number: [id number] Number: Treating RN: Montey Hora Date of Birth/Sex: November 24, 1932 (81 y.o. Male) Other Clinician: Primary Care Treating GREEN, Toma Aran, [place] Physician: Physician/Extender: Referring Physician: Matthew Folks in Treatment: 0 Verbal / Phone Orders: Yes Clinician: Montey Hora Read Back and Verified: Yes Diagnosis Coding Discharge From [facility] Services o Discharge from [place] Signature(s) Signed: 12/24/2014 4:34:10 PM By: Christin Fudge MD, FACS Signed: 12/24/2014 5:19:50 PM By: Montey Hora Entered By: Montey Hora on 12/24/2014 13:51:52 [name] ([id number]) -------------------------------------------------------------------------------- Problem List Details FINNLEY, LARUSSO 12/24/2014 12:45 Patient Name: Date of Service: L. PM Medical Record Patient Account Number: [id number] Number: Treating RN: Montey Hora Date of Birth/Sex: 20-Oct-1932 (81 y.o. Male) Other Clinician: Primary Care Treating GREEN, Toma Aran, [place] Physician: Physician/Extender: Referring Physician: Rinaldo Ratel Weeks in Treatment: 0 Active Problems ICD-10 Encounter Code Description Active Date Diagnosis B36.9 Superficial mycosis, unspecified 12/24/2014 Yes L84 Corns and callosities 12/24/2014 Yes Inactive Problems Resolved Problems Electronic Signature(s) Signed: 12/24/2014 3:13:52 PM By: Christin Fudge MD, FACS Previous Signature: 12/24/2014 2:59:06 PM Version By: Christin Fudge MD, FACS Entered By: Christin Fudge on 12/24/2014 15:13:52 [name] ([id number]) -------------------------------------------------------------------------------- Progress Note Details FRANCOIS, ELK 12/24/2014 12:45 Patient Name: Date of Service: L. PM Medical Record Patient Account Number: [id number] Number: Treating RN: Montey Hora Date of Birth/Sex: [birth year] (81 y.o. Male) Other Clinician: Primary Care Treating GREEN, Toma Aran, [place] Physician: Physician/Extender: Referring Physician: Matthew Folks in Treatment: 0 Subjective Chief Complaint Information obtained from Patient Patient presents to the wound care center for a consult due non healing wound. he has had a ulceration on his sacral area on and off for the last 2 months and a large callosity on his left forefoot plantar aspect  for several months. History of Present Illness (HPI) The following HPI elements were documented for the patient's wound: Location: left sacral area has a redness and ulceration and also his left foot has a callosity which is painful. Quality: Patient reports experiencing a dull pain to affected area(s). Severity: Patient states wound (s) are getting better. Duration: Patient has had the wound for > 3 months prior to seeking treatment at the wound center Timing: Pain in wound is Intermittent (comes and goes Context: The wound appeared gradually over time Modifying Factors: Consults to this date include: he has seen podiatry and also seen a dermatologist Associated Signs and Symptoms: Patient reports having difficulty standing for long periods. 81 year old gentleman who is being seen by his dermatologist Dr. Rozann Lesches who has sent him here to [place] for an opinion regarding a sacral wound which has fungal infection too. He also has a large on on the left lower extremity which has  been treated by his podiatrist Dr. Servando Salina. His past medical history is significant for migraines, peripheral neuropathy, cervical stenosis of the spine, anemia,carotid artery disease with the mild LVH and ejection fraction of 60%, peripheral arterial disease, carotid stenosis status post left artery endarterectomy, bilateral inguinal hernia,status post appendectomy, lumbar laminectomy, left carotid endarterectomy, left hip arthroplasty, and laparoscopic inguinal hernia repair. is also a former smoker quit 10 years ago. Wound History Patient presents with 2 open wounds that have been present for approximately 1 year on bottom, foot - . Patient has been treating wounds in the following manner: nothing on bottom, bandaid on foot. Laboratory tests have not been performed in the last month. Patient reportedly has not tested positive for an antibiotic resistant organism. Patient reportedly has not tested positive for  osteomyelitis. Patient reportedly has not had testing performed to evaluate circulation in the legs. RIC, ROSENBERG ([id number]) Patient History Information obtained from Patient. Allergies latex, adhesive Family History Cancer - Father, Diabetes - Mother, Mother, Hypertension - Mother, Father, Seizures - Siblings, Stroke - Mother, No family history of Heart Disease, Hereditary Spherocytosis, Thyroid Problems, Tuberculosis. Social History Former smoker - cigars, Marital Status - Married, Alcohol Use - Never, Drug Use - No History, Caffeine Use - Never. Medical History Eyes Patient has history of Glaucoma Cardiovascular Patient has history of Peripheral Arterial Disease Neurologic Patient has history of Neuropathy Oncologic Denies history of Received Chemotherapy, Received Radiation Medical And Surgical History Notes Neurologic tremors Review of Systems (ROS) Constitutional Symptoms (General Health) The patient has no complaints or symptoms. Eyes Complains or has symptoms of Glasses / Contacts. Ear/Nose/Mouth/Throat The patient has no complaints or symptoms. Hematologic/Lymphatic The patient has no complaints or symptoms. Respiratory The patient has no complaints or symptoms. Cardiovascular The patient has no complaints or symptoms. Gastrointestinal The patient has no complaints or symptoms. Endocrine The patient has no complaints or symptoms. Genitourinary Complains or has symptoms of Incontinence/dribbling. Immunological The patient has no complaints or symptoms. REMO, KIRSCHENMANN ([id number]) Integumentary (Skin) The patient has no complaints or symptoms. Oncologic The patient has no complaints or symptoms. Psychiatric The patient has no complaints or symptoms. Medications flaxseed oral 1 1 tablet oral mirtazapine 45 mg tablet oral 1 1 tablet oral aspirin 81 mg tablet,delayed release oral 1 1 tablet,delayed release (DR/EC) oral [name] 50 mg/5 gram  (1 %) transdermal gel transdermal gel transdermal clonazepam 0.5 mg tablet oral tablet oral topiramate XR 25 mg capsule,extended release 24 hr oral capsule,extended release 24hr oral atorvastatin 20 mg tablet oral 1 1 tablet oral ICaps AREDS 7,160 unit-113 mg-100 unit tablet,delayed release oral 1 1 tablet,delayed release (DR/EC) oral tamsulosin 0.4 mg capsule oral 1 1 capsule,extended release 24hr oral Boost Plus 0.06 gram-1.5 kcal/mL oral liquid oral 1 1 liquid oral L-Methylfolate 7.5 mg tablet oral 1 1 tablet oral Linzess 290 mcg capsule oral 1 1 capsule oral Miralax 17 gram/dose oral powder oral powder oral krill oil 500 mg capsule oral 1 1 capsule oral bimatoprost 0.01 % eye drops ophthalmic drops ophthalmic brimonidine 0.15 % eye drops ophthalmic drops ophthalmic bupropion HCl XL 300 mg 24 hr tablet, extended release oral 1 1 tablet extended release 24 hr oral sertraline 100 mg tablet oral 2 2 tablet oral levothyroxine 50 mcg tablet oral 1 tablet oral ketoconazole 2 % topical cream topical cream topical fluticasone 0.05 % topical cream topical cream topical Super B Complex + C 150 mg tablet oral 1 1 tablet oral cholecalciferol (vitamin D3) 1,000 unit tablet oral  3 3 tablet oral Objective Constitutional Pulse regular. Respirations normal and unlabored. Afebrile. Vitals Time Taken: 12:58 PM, Height: 72 in, Source: Stated, Weight: 155 lbs, Source: Stated, BMI: 21, Temperature: 97.9 F, Pulse: 41 bpm, Respiratory Rate: 18 breaths/min, Blood Pressure: 128/59 mmHg. CHOICE, KLEINSASSER ([id number]) Eyes Nonicteric. Reactive to light. Ears, Nose, Mouth, and Throat Lips, teeth, and gums WNL.Marland Kitchen Moist mucosa without lesions . Neck supple and nontender. No palpable supraclavicular or cervical adenopathy. Normal sized without goiter. Respiratory WNL. No retractions.. Cardiovascular Pedal Pulses WNL. No clubbing, cyanosis or edema. Gastrointestinal (GI) Abdomen without masses or  tenderness.. No liver or spleen enlargement or tenderness.. Lymphatic No adneopathy. No adenopathy. No adenopathy. Musculoskeletal Adexa without tenderness or enlargement.. Digits and nails w/o clubbing, cyanosis, infection, petechiae, ischemia, or inflammatory conditions.Marland Kitchen Psychiatric Judgement and insight Intact.. No evidence of depression, anxiety, or agitation.. General Notes: On the sacral area to the left of the midline he has a patch of fungal infection but there is no open ulceration. On his left forefoot in the region of the plantar aspect he has a large callosity in the region of the first and second metatarsal head. There is no open wound and this is being treated by his podiatrist. Integumentary (Hair, Skin) No suspicious lesions. No crepitus or fluctuance. No peri-wound warmth or erythema. No masses.. Assessment Active Problems ICD-10 B36.9 - Superficial mycosis, unspecified L84 - Corns and callosities Faas, Kru L. ([id number]) On reviewing the patient I have recommended: 1. he continue with the treatment of Nizoral and Cutivate cream to be applied to the sacral region twice a day and when necessary as described by his dermatologist. 2. he will follow-up with his podiatrist for further care regarding the callosity on his left forefoot and make certain that he uses proper footwear and offloads this area. 3. Follow-up with me as needed but no further care required if problems or open wounds are not present. This has been discussed with the patient and his wife and they both have had all questions answered and understand the treatment plan. Plan Discharge From [facility]: Discharge from [place] On reviewing the patient I have recommended: 1. he continue with the treatment of Nizoral and Cutivate cream to be applied to the sacral region twice a day and when necessary as described by his dermatologist. 2. he will follow-up with his podiatrist for further  care regarding the callosity on his left forefoot and make certain that he uses proper footwear and offloads this area. 3. Follow-up with me as needed but no further care required if problems or open wounds are not present. This has been discussed with the patient and his wife and they both have had all questions answered and understand the treatment plan. Electronic Signature(s) Signed: 12/24/2014 3:19:51 PM By: Christin Fudge MD, FACS Entered By: Christin Fudge on 12/24/2014 15:19:51 [name] ([id number]) -------------------------------------------------------------------------------- ROS/PFSH Details JASSEN, SARVER 12/24/2014 12:45 Patient Name: Date of Service: L. PM Medical Record Patient Account Number: [id number] Number: Treating RN: Montey Hora Date of Birth/Sex: [birth year] (81 y.o. Male) Other Clinician: Primary Care Treating GREEN, Toma Aran, [place] Physician: Physician/Extender: Referring Physician: Matthew Folks in Treatment: 0 Information Obtained From Patient Wound History Do you currently have one or more open woundso Yes How many open wounds do you currently haveo 2 Approximately how long have you had your woundso 1 year on bottom, foot - How have you been treating your wound(s) until nowo nothing on bottom, bandaid on foot Has  your wound(s) ever healed and then re-openedo No Have you had any lab work done in the past montho No Have you tested positive for an antibiotic resistant organism No (MRSA, VRE)o Have you tested positive for osteomyelitis (bone infection)o No Have you had any tests for circulation on your legso No Eyes Complaints and Symptoms: Positive for: Glasses / Contacts Medical History: Positive for: Glaucoma Genitourinary Complaints and Symptoms: Positive for: Incontinence/dribbling Constitutional Symptoms (General Health) Complaints and Symptoms: No Complaints or  Symptoms Ear/Nose/Mouth/Throat Complaints and Symptoms: No Complaints or Symptoms Hematologic/Lymphatic JADIEN, LEHIGH ([id number]) Complaints and Symptoms: No Complaints or Symptoms Respiratory Complaints and Symptoms: No Complaints or Symptoms Cardiovascular Complaints and Symptoms: No Complaints or Symptoms Medical History: Positive for: Peripheral Arterial Disease Gastrointestinal Complaints and Symptoms: No Complaints or Symptoms Endocrine Complaints and Symptoms: No Complaints or Symptoms Immunological Complaints and Symptoms: No Complaints or Symptoms Integumentary (Skin) Complaints and Symptoms: No Complaints or Symptoms Neurologic Medical History: Positive for: Neuropathy Past Medical History Notes: tremors Oncologic Complaints and Symptoms: No Complaints or Symptoms Medical History: Negative for: Received Chemotherapy; Received Radiation Psychiatric DEMONTREZ, RINDFLEISCH ([id number]) Complaints and Symptoms: No Complaints or Symptoms HBO Extended History Items Eyes: Glaucoma Family and Social History Cancer: Yes - Father; Diabetes: Yes - Mother, Mother; Heart Disease: No; Hereditary Spherocytosis: No; Hypertension: Yes - Mother, Father; Seizures: Yes - Siblings; Stroke: Yes - Mother; Thyroid Problems: No; Tuberculosis: No; Former smoker - cigars; Marital Status - Married; Alcohol Use: Never; Drug Use: No History; Caffeine Use: Never; Financial Concerns: No; Food, Clothing or Shelter Needs: No; Support System Lacking: No; Transportation Concerns: No; Advanced Directives: Yes (Not Provided); Patient does not want information on Advanced Directives; Living Will: Yes (Not Provided); Medical Power of Attorney: Yes - daughter Deatra Ina (Not Provided) Physician Affirmation I have reviewed and agree with the above information. Electronic Signature(s) Signed: 12/24/2014 1:30:59 PM By: Christin Fudge MD, FACS Signed: 12/24/2014 5:19:50 PM By: Montey Hora Entered By: Christin Fudge on 12/24/2014 13:30:58 [name] ([id number]) -------------------------------------------------------------------------------- SuperBill Details Patient Name: [name] Date of Service: 12/24/2014 Medical Record Number: [id number] Patient Account Number: [id number] Date of Birth/Sex: 1932/07/01 (81 y.o. Male) Treating RN: Montey Hora Primary Care Physician: Jeanmarie Hubert Other Clinician: Referring Physician: Rinaldo Ratel Treating Physician/Extender: Frann Rider in Treatment: 0 Diagnosis Coding ICD-10 Codes Code Description B36.9 Superficial mycosis, unspecified L84 Corns and callosities Facility Procedures CPT4 Code: [id number] Description: 50932 - WOUND CARE VISIT-LEV 3 EST PT Modifier: Quantity: 1 Physician Procedures CPT4 Code: [id number] Description: 09983 - WC PHYS LEVEL 4 - NEW PT ICD-10 Description Diagnosis B36.9 Superficial mycosis, unspecified L84 Corns and callosities Modifier: Quantity: 1 Electronic Signature(s) Signed: 12/24/2014 3:20:22 PM By: Christin Fudge MD, FACS Entered By: Christin Fudge on 12/24/2014 15:20:22

## 2014-12-25 NOTE — Progress Notes (Signed)
Jose Leach, Jose Leach ([id number]) Visit Report for 12/24/2014 Abuse/Suicide Risk Screen Details Jose Leach, Jose Leach 12/24/2014 12:45 Patient Name: Date of Service: L. PM Medical Record Patient Account Number: [id number] Number: Treating RN: Montey Hora Date of Birth/Sex: Mar 21, 1932 (81 y.o. Male) Other Clinician: [place], [place] Physician: Physician/Extender: Referring Physician: Matthew Folks in Treatment: 0 Abuse/Suicide Risk Screen Items Answer ABUSE/SUICIDE RISK SCREEN: Has anyone close to you tried to hurt or harm you recentlyo No Do you feel uncomfortable with anyone in your familyo No Has anyone forced you do things that you didnot want to doo No Do you have any thoughts of harming yourselfo No Patient displays signs or symptoms of abuse and/or neglect. No Electronic Signature(s) Signed: 12/24/2014 5:19:50 PM By: Montey Hora Entered By: Montey Hora on 12/24/2014 13:11:38 Jose Leach ([id number]) -------------------------------------------------------------------------------- Activities of Daily Living Details Jose Leach, Jose Leach 12/24/2014 12:45 Patient Name: Date of Service: L. PM Medical Record Patient Account Number: [id number] Number: Treating RN: Montey Hora Date of Birth/Sex: October 01, 1932 (81 y.o. Male) Other Clinician: Primary Care Treating GREEN, [place] Physician: Physician/Extender: Referring Physician: Matthew Folks in Treatment: 0 Activities of Daily Living Items Answer Activities of Daily Living (Please select one for each item) Drive Automobile Need Assistance Take Medications Completely Able Use Telephone Completely Able Care for Appearance Completely Able Use Toilet Completely Able Bath / Shower Completely Able Dress Self Completely Able Feed Self Completely Able Walk Need Assistance Get In / Out Bed Completely Able Housework Need  Assistance Prepare Meals Completely [place] for Self Need Assistance Electronic Signature(s) Signed: 12/24/2014 5:19:50 PM By: Montey Hora Entered By: Montey Hora on 12/24/2014 13:12:05 Jose Leach ([id number]) -------------------------------------------------------------------------------- Education Assessment Details Jose Leach, Jose Leach 12/24/2014 12:45 Patient Name: Date of Service: L. PM Medical Record Patient Account Number: [id number] Number: Treating RN: Montey Hora Date of Birth/Sex: 1932/06/28 (81 y.o. Male) Other Clinician: Primary Care Treating GREEN, Toma Aran, [place] Physician: Physician/Extender: Referring Physician: Matthew Folks in Treatment: 0 Primary Learner Assessed: Patient Learning Preferences/Education Level/Primary Language Learning Preference: Explanation, Demonstration Highest Education Level: College or Above Preferred Language: English Cognitive Barrier Assessment/Beliefs Language Barrier: No Translator Needed: No Memory Deficit: No Emotional Barrier: No Cultural/Religious Beliefs Affecting Medical No Care: Physical Barrier Assessment Impaired Vision: No Impaired Hearing: No Decreased Hand dexterity: No Knowledge/Comprehension Assessment Knowledge Level: Medium Comprehension Level: Medium Ability to understand written Medium instructions: Ability to understand verbal Medium instructions: Motivation Assessment Anxiety Level: Calm Cooperation: Cooperative Education Importance: Acknowledges Need Interest in Health Problems: Asks Questions Perception: Coherent Willingness to Engage in Self- Medium Management Activities: Medium MING, MCMANNIS ([id number]) Readiness to Engage in Self- Management Activities: Electronic Signature(s) Signed: 12/24/2014 5:19:50 PM By: Montey Hora Entered By: Montey Hora on 12/24/2014 13:12:30 Jose Leach  ([id number]) -------------------------------------------------------------------------------- Fall Risk Assessment Details Jose Leach, Jose Leach 12/24/2014 12:45 Patient Name: Date of Service: L. PM Medical Record Patient Account Number: [id number] Number: Treating RN: Montey Hora Date of Birth/Sex: July 31, 1932 (81 y.o. Male) Other Clinician: Primary Care Treating GREEN, Toma Aran, [place] Physician: Physician/Extender: Referring Physician: Matthew Folks in Treatment: 0 Fall Risk Assessment Items FALL RISK ASSESSMENT: History of falling - immediate or within 3 months 25 Yes Secondary diagnosis 0 No Ambulatory aid None/bed rest/wheelchair/nurse 0 No Crutches/cane/walker 15 Yes Furniture 0 No IV Access/Saline Lock 0 No Gait/Training Normal/bed rest/immobile 0 No Weak 10 Yes Impaired 0 No Mental Status Oriented to own ability 0 Yes Electronic Signature(s) Signed: 12/24/2014  5:19:50 PM By: Montey Hora Entered By: Montey Hora on 12/24/2014 13:12:42 Jose Leach ([id number]) -------------------------------------------------------------------------------- Foot Assessment Details Jose Leach, Jose Leach 12/24/2014 12:45 Patient Name: Date of Service: L. PM Medical Record Patient Account Number: [id number] Number: Treating RN: Montey Hora Date of Birth/Sex: 1932-07-30 (81 y.o. Male) Other Clinician: Primary Care Treating GREEN, Toma Aran, [place] Physician: Physician/Extender: Referring Physician: Matthew Folks in Treatment: 0 Foot Assessment Items Site Locations + = Sensation present, - = Sensation absent, C = Callus, U = Ulcer R = Redness, W = Warmth, M = Maceration, PU = Pre-ulcerative lesion F = Fissure, S = Swelling, D = Dryness Assessment Right: Left: Other Deformity: No No Prior Foot Ulcer: No No Prior Amputation: No No Charcot Joint: No No Ambulatory Status: Ambulatory With Help Assistance Device:  Walker Gait: Administrator, arts) Signed: 12/24/2014 5:19:50 PM By: Norm Salt, Dorthey Sawyer ([id number]) Entered By: Montey Hora on 12/24/2014 13:20:17 Jose Leach ([id number]) -------------------------------------------------------------------------------- Nutrition Risk Assessment Details Jose Leach, Jose Leach 12/24/2014 12:45 Patient Name: Date of Service: L. PM Medical Record Patient Account Number: [id number] Number: Treating RN: Montey Hora Date of Birth/Sex: [birth year] (81 y.o. Male) Other Clinician: Primary Care Treating GREEN, Toma Aran, [place] Physician: Physician/Extender: Referring Physician: Rinaldo Ratel Weeks in Treatment: 0 Height (in): 72 Weight (lbs): 155 Body Mass Index (BMI): 21 Nutrition Risk Assessment Items NUTRITION RISK SCREEN: I have an illness or condition that made me change the kind and/or 0 No amount of food I eat I eat fewer than two meals per day 0 No I eat few fruits and vegetables, or milk products 0 No I have three or more drinks of beer, liquor or wine almost every day 0 No I have tooth or mouth problems that make it hard for me to eat 0 No I don't always have enough money to buy the food I need 0 No I eat alone most of the time 0 No I take three or more different prescribed or over-the-counter drugs a 1 Yes day Without wanting to, I have lost or gained 10 pounds in the last six 0 No months I am not always physically able to shop, cook and/or feed myself 0 No Nutrition Protocols Good Risk Protocol 0 No interventions needed Moderate Risk Protocol Electronic Signature(s) Signed: 12/24/2014 5:19:50 PM By: Montey Hora Entered By: Montey Hora on 12/24/2014 13:12:49

## 2014-12-25 NOTE — Progress Notes (Signed)
Jose Leach, Jose Leach ([id number]) Visit Report for 12/24/2014 Allergy List Details Jose Leach, Jose Leach 12/24/2014 12:45 Patient Name: Date of Service: L. PM Medical Record Patient Account Number: [id number] Number: Treating RN: Jose Leach Date of Birth/Sex: 09/07/1932 (81 y.o. Male) Other Clinician: Primary Care Treating Leach, Jose Aran, [place] Physician: Physician/Extender: Referring Physician: Rinaldo Leach Weeks in Treatment: 0 Allergies Active Allergies latex adhesive Allergy Notes Electronic Signature(s) Signed: 12/24/2014 5:19:50 PM By: Jose Leach Entered By: Jose Leach on 12/24/2014 12:59:29 Jose Leach ([id number]) -------------------------------------------------------------------------------- Arrival Information Details Jose Leach, Jose Leach 12/24/2014 12:45 Patient Name: Date of Service: L. PM Medical Record Patient Account Number: [id number] Number: Treating RN: Jose Leach Date of Birth/Sex: 1932/11/04 (81 y.o. Male) Other Clinician: Primary Care Treating Leach, Jose Aran, [place] Physician: Physician/Extender: Referring Physician: Matthew Leach in Treatment: 0 Visit Information Patient Arrived: [place] Arrival Time: 12:57 Accompanied By: spouse Transfer Assistance: None Patient Identification Verified: Yes Secondary Verification Process Completed: Yes Patient Has Alerts: No Electronic Signature(s) Signed: 12/24/2014 5:19:50 PM By: Jose Leach Entered By: Jose Leach on 12/24/2014 12:58:38 Jose Leach ([id number]) -------------------------------------------------------------------------------- Clinic Level of Care Assessment Details Jose Leach, Jose Leach 12/24/2014 12:45 Patient Name: Date of Service: L. PM Medical Record Patient Account Number: [id number] Number: Treating RN: Jose Leach Date of Birth/Sex: January 17, 1933 (81 y.o. Male) Other Clinician: [place], [place] Physician: Physician/Extender: Referring Physician: Matthew Leach in Treatment: 0 Clinic Level of Care Assessment Items TOOL 2 Quantity Score []  - Use when only an EandM is performed on the INITIAL visit 0 ASSESSMENTS - Nursing Assessment / Reassessment X - General Physical Exam (combine w/ comprehensive assessment (listed just 1 20 below) when performed on new pt. evals) X - Comprehensive Assessment (HX, ROS, Risk Assessments, Wounds Hx, etc.) 1 25 ASSESSMENTS - Wound and Skin Assessment / Reassessment []  - Simple Wound Assessment / Reassessment - one wound 0 []  - Complex Wound Assessment / Reassessment - multiple wounds 0 []  - Dermatologic / Skin Assessment (not related to wound area) 0 ASSESSMENTS - Ostomy and/or Continence Assessment and Care []  - Incontinence Assessment and Management 0 []  - Ostomy Care Assessment and Management (repouching, etc.) 0 PROCESS - Coordination of Care X - Simple Patient / Family Education for ongoing care 1 15 []  - Complex (extensive) Patient / Family Education for ongoing care 0 X - Staff obtains Consents, Records, Test Results / Process Orders 1 10 []  - Staff telephones HHA, Nursing Homes / Clarify orders / etc 0 []  - Routine Transfer to another Facility (non-emergent condition) 0 []  - Routine Hospital Admission (non-emergent condition) 0 X - New Admissions / Biomedical engineer / Ordering NPWT, Apligraf, etc. 1 15 Leach, Jose L. ([id number]) []  - Emergency Hospital Admission (emergent condition) 0 X - Simple Discharge Coordination 1 10 []  - Complex (extensive) Discharge Coordination 0 PROCESS - Special Needs []  - Pediatric / Minor Patient Management 0 []  - Isolation Patient Management 0 []  - Hearing / Language / Visual special needs 0 []  - Assessment of Community assistance (transportation, D/C planning, etc.) 0 []  - Additional assistance / Altered mentation 0 []  - Support Surface(s)  Assessment (bed, cushion, seat, etc.) 0 INTERVENTIONS - Wound Cleansing / Measurement []  - Wound Imaging (photographs - any number of wounds) 0 []  - Wound Tracing (instead of photographs) 0 []  - Simple Wound Measurement - one wound 0 []  - Complex Wound Measurement - multiple wounds 0 []  - Simple Wound Cleansing - one wound 0 []  - Complex  Wound Cleansing - multiple wounds 0 INTERVENTIONS - Wound Dressings []  - Small Wound Dressing one or multiple wounds 0 []  - Medium Wound Dressing one or multiple wounds 0 []  - Large Wound Dressing one or multiple wounds 0 []  - Application of Medications - injection 0 INTERVENTIONS - Miscellaneous []  - External ear exam 0 []  - Specimen Collection (cultures, biopsies, blood, body fluids, etc.) 0 []  - Specimen(s) / Culture(s) sent or taken to Lab for analysis 0 []  - Patient Transfer (multiple staff / Jose Leach / Similar devices) 0 Jose Leach ([id number]) []  - Simple Staple / Suture removal (25 or less) 0 []  - Complex Staple / Suture removal (26 or more) 0 []  - Hypo / Hyperglycemic Management (close monitor of Blood Glucose) 0 X - Ankle / Brachial Index (ABI) - do not check if billed separately 1 15 Has the patient been seen at the hospital within the last three years: Yes Total Score: 110 Level Of Care: New/Established - Level 3 Electronic Signature(s) Signed: 12/24/2014 5:19:50 PM By: Jose Leach Entered By: Jose Leach on 12/24/2014 13:52:20 Jose Leach ([id number]) -------------------------------------------------------------------------------- Encounter Discharge Information Details Jose Leach, Jose Leach 12/24/2014 12:45 Patient Name: Date of Service: L. PM Medical Record Patient Account Number: [id number] Number: Treating RN: Jose Leach Date of Birth/Sex: 22-May-1932 (81 y.o. Male) Other Clinician: Primary Care Treating Leach, Jose Aran, [place] Physician: Physician/Extender: Referring Physician:  Matthew Leach in Treatment: 0 Encounter Discharge Information Items Discharge Pain Level: 0 Discharge Condition: Stable Ambulatory Status: Wheelchair Discharge Destination: Home Transportation: Private Auto Accompanied By: spouse Schedule Follow-up Appointment: No Medication Reconciliation completed and provided to Patient/Care No Suann Klier: Provided on Clinical Summary of Care: 12/24/2014 Form Type Recipient Paper Patient LS Electronic Signature(s) Signed: 12/24/2014 2:04:39 PM By: Ruthine Dose Entered By: Ruthine Dose on 12/24/2014 14:04:39 Jose Leach ([id number]) -------------------------------------------------------------------------------- Lower Extremity Assessment Details Jose Leach, Jose Leach 12/24/2014 12:45 Patient Name: Date of Service: L. PM Medical Record Patient Account Number: [id number] Number: Treating RN: Jose Leach Date of Birth/Sex: [birth year] (81 y.o. Male) Other Clinician: Primary Care Treating Leach, Jose Aran, [place] Physician: Physician/Extender: Referring Physician: Rinaldo Leach Weeks in Treatment: 0 Edema Assessment Assessed: [Left: No] [Right: No] Edema: [Left: No] [Right: No] Vascular Assessment Pulses: Posterior Tibial Palpable: [Left:Yes] [Right:Yes] Dorsalis Pedis Palpable: [Left:Yes] [Right:Yes] Extremity colors, hair growth, and conditions: Extremity Color: [Left:Normal] [Right:Normal] Hair Growth on Extremity: [Left:No] [Right:No] Temperature of Extremity: [Left:Cool] [Right:Cool] Capillary Refill: [Left:> 3 seconds] [Right:> 3 seconds] Blood Pressure: Brachial: [Right:128] Dorsalis Pedis: [Left:Dorsalis Pedis: 96] Ankle: Posterior Tibial: [Left:Posterior Tibial: 244] [Right:0.94] Toe Nail Assessment Left: Right: Thick: Yes Yes Discolored: Yes Yes Deformed: Yes Yes Improper Length and Hygiene: No No Electronic Signature(s) Signed: 12/24/2014 5:19:50 PM By: Jose Leach Entered  By: Jose Leach on 12/24/2014 13:33:08 Jose Leach ([id number]) -------------------------------------------------------------------------------- Patient/Caregiver Education Details Jose Leach, Jose Leach 12/24/2014 12:45 Patient Name: Date of Service: L. PM Medical Record Patient Account Number: [id number] Number: Treating RN: Jose Leach Date of Birth/Gender: January 26, 1933 (81 y.o. Male) Other Clinician: Primary Care Treating Leach, Jose Aran, [place] Physician: Physician/Extender: Referring Physician: Matthew Leach in Treatment: 0 Education Assessment Education Provided To: Patient and Caregiver Education Topics Provided Basic Hygiene: Handouts: Other: see podiatrist for callus care Methods: Explain/Verbal Responses: State content correctly Electronic Signature(s) Signed: 12/24/2014 5:19:50 PM By: Jose Leach Entered By: Jose Leach on 12/24/2014 13:53:09 Jose Leach ([id number]) -------------------------------------------------------------------------------- Vitals Details Jose Leach, Jose Leach 12/24/2014 12:45 Patient Name: Date of Service: L. PM Medical Record Patient Account Number: [id number]  [id number] Number: Treating RN: Jose Leach Date of Birth/Sex: [birth year] (82 y.o. Male) Other Clinician: Primary Care Treating Leach, Jose Aran, [place] Physician: Physician/Extender: Referring Physician: Matthew Leach in Treatment: 0 Vital Signs Time Taken: 12:58 Temperature (F): 97.9 Height (in): 72 Pulse (bpm): 41 Source: Stated Respiratory Rate (breaths/min): 18 Weight (lbs): 155 Blood Pressure (mmHg): 128/59 Source: Stated Reference Range: 80 - 120 mg / dl Body Mass Index (BMI): 21 Electronic Signature(s) Signed: 12/24/2014 5:19:50 PM By: Jose Leach Entered By: Jose Leach on 12/24/2014 13:26:56

## 2015-01-14 ENCOUNTER — Other Ambulatory Visit: Payer: Self-pay | Admitting: *Deleted

## 2015-01-14 MED ORDER — LINACLOTIDE 290 MCG PO CAPS: ORAL_CAPSULE | ORAL | Status: DC

## 2015-01-14 NOTE — Telephone Encounter (Signed)
Jose Leach Francis King 

## 2015-01-16 ENCOUNTER — Ambulatory Visit (INDEPENDENT_AMBULATORY_CARE_PROVIDER_SITE_OTHER): Payer: Medicare Other

## 2015-01-16 ENCOUNTER — Ambulatory Visit (INDEPENDENT_AMBULATORY_CARE_PROVIDER_SITE_OTHER): Payer: Medicare Other | Admitting: Family Medicine

## 2015-01-16 VITALS — BP 135/71 | HR 47 | Temp 98.0°F | Resp 16 | Wt 153.4 lb

## 2015-01-16 DIAGNOSIS — M79671 Pain in right foot: Secondary | ICD-10-CM

## 2015-01-16 DIAGNOSIS — M7989 Other specified soft tissue disorders: Secondary | ICD-10-CM

## 2015-01-16 NOTE — Patient Instructions (Signed)
I do not see any broken bones. Ok to apply ice on and off every 10-15 minutes as needed for next day or two., elevate foot when seated to lessen swelling, tylenol if needed for pain, hard soled shoe, and use walker if needed for stability.   If pain not improving in next week to 10 days (or worse sooner), return or follow up with your primary provider.   Foot Contusion A foot contusion is a deep bruise to the foot. Contusions are the result of an injury that caused bleeding under the skin. The contusion may turn blue, purple, or yellow. Minor injuries will give you a painless contusion, but more severe contusions may stay painful and swollen for a few weeks. CAUSES  A foot contusion comes from a direct blow to that area, such as a heavy object falling on the foot. SYMPTOMS   Swelling of the foot.  Discoloration of the foot.  Tenderness or soreness of the foot. DIAGNOSIS  You will have a physical exam and will be asked about your history. You may need an X-ray of your foot to look for a broken bone (fracture).  TREATMENT  An elastic wrap may be recommended to support your foot. Resting, elevating, and applying cold compresses to your foot are often the best treatments for a foot contusion. Over-the-counter medicines may also be recommended for pain control. HOME CARE INSTRUCTIONS   Put ice on the injured area.  Put ice in a plastic bag.  Place a towel between your skin and the bag.  Leave the ice on for 15-20 minutes, 03-04 times a day.  Only take over-the-counter or prescription medicines for pain, discomfort, or fever as directed by your caregiver.  If told, use an elastic wrap as directed. This can help reduce swelling. You may remove the wrap for sleeping, showering, and bathing. If your toes become numb, cold, or blue, take the wrap off and reapply it more loosely.  Elevate your foot with pillows to reduce swelling.  Try to avoid standing or walking while the foot is painful.  Do not resume use until instructed by your caregiver. Then, begin use gradually. If pain develops, decrease use. Gradually increase activities that do not cause discomfort until you have normal use of your foot.  See your caregiver as directed. It is very important to keep all follow-up appointments in order to avoid any lasting problems with your foot, including long-term (chronic) pain. SEEK IMMEDIATE MEDICAL CARE IF:   You have increased redness, swelling, or pain in your foot.  Your swelling or pain is not relieved with medicines.  You have loss of feeling in your foot or are unable to move your toes.  Your foot turns cold or blue.  You have pain when you move your toes.  Your foot becomes warm to the touch.  Your contusion does not improve in 2 days. MAKE SURE YOU:   Understand these instructions.  Will watch your condition.  Will get help right away if you are not doing well or get worse.   This information is not intended to replace advice given to you by your health care provider. Make sure you discuss any questions you have with your health care provider.   Document Released: 11/23/2005 Document Revised: 08/03/2011 Document Reviewed: 10/08/2014 Elsevier Interactive Patient Education [employer].
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
## 2015-01-20 ENCOUNTER — Encounter (HOSPITAL_BASED_OUTPATIENT_CLINIC_OR_DEPARTMENT_OTHER): Payer: Medicare Other

## 2015-05-14 ENCOUNTER — Other Ambulatory Visit: Payer: Self-pay | Admitting: Internal Medicine

## 2015-05-16 ENCOUNTER — Other Ambulatory Visit: Payer: Self-pay | Admitting: *Deleted

## 2015-05-16 MED ORDER — LEVOTHYROXINE SODIUM 50 MCG PO TABS: ORAL_TABLET | ORAL | Status: DC

## 2015-05-16 NOTE — Telephone Encounter (Signed)
[employer] [facility] King 

## 2015-05-19 ENCOUNTER — Other Ambulatory Visit: Payer: Self-pay | Admitting: Internal Medicine

## 2015-05-24 IMAGING — CR DG PELVIS 1-2V
2 series · 2 of 2 positions shown · non-contrast
Comparison: Radiograph dated 01/26/2013

CLINICAL DATA: Right hip pain secondary to a fall.

EXAM:
PELVIS - 1-2 VIEW

[t pelvis a.p. (1 of 2)]
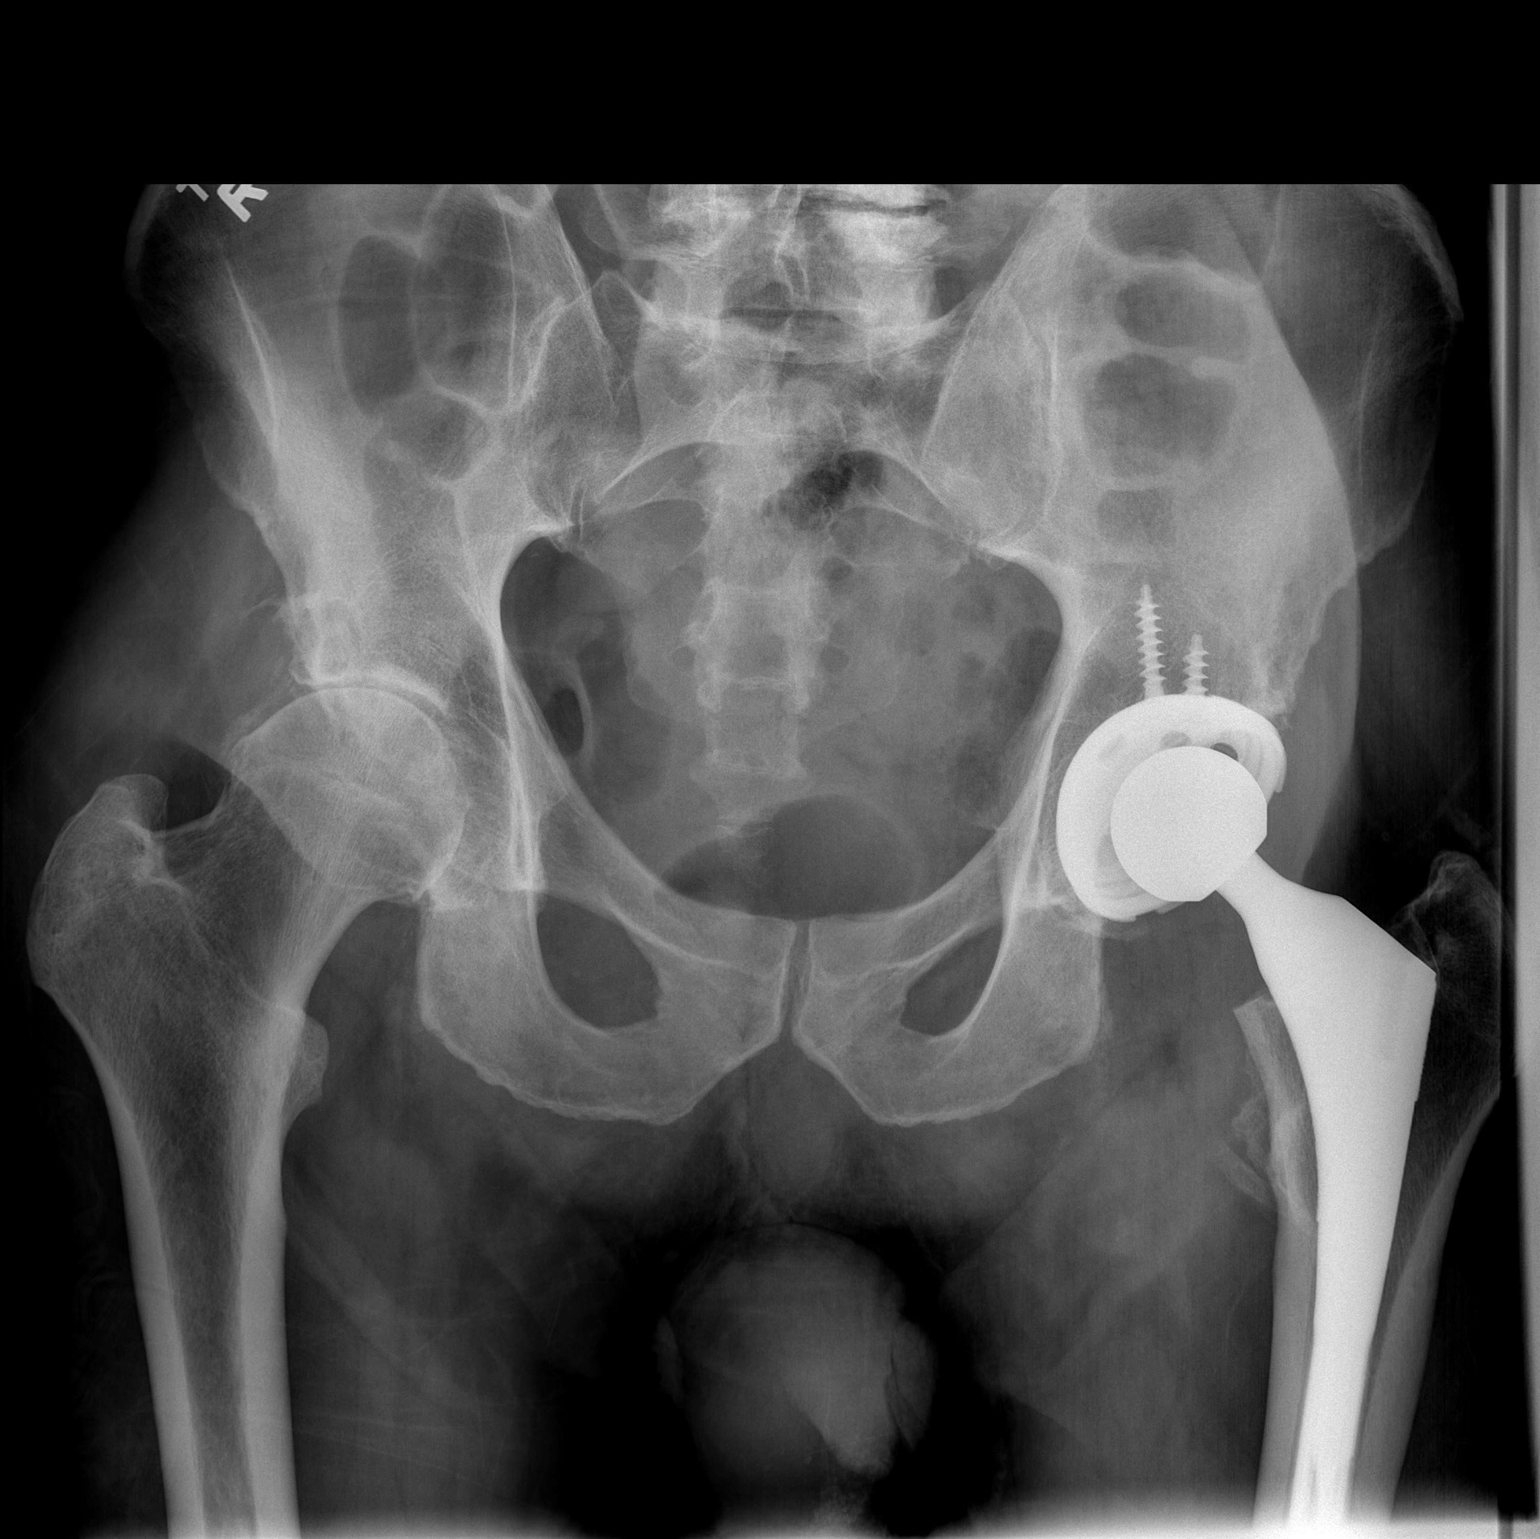

[t pelvis a.p. (2 of 2)]
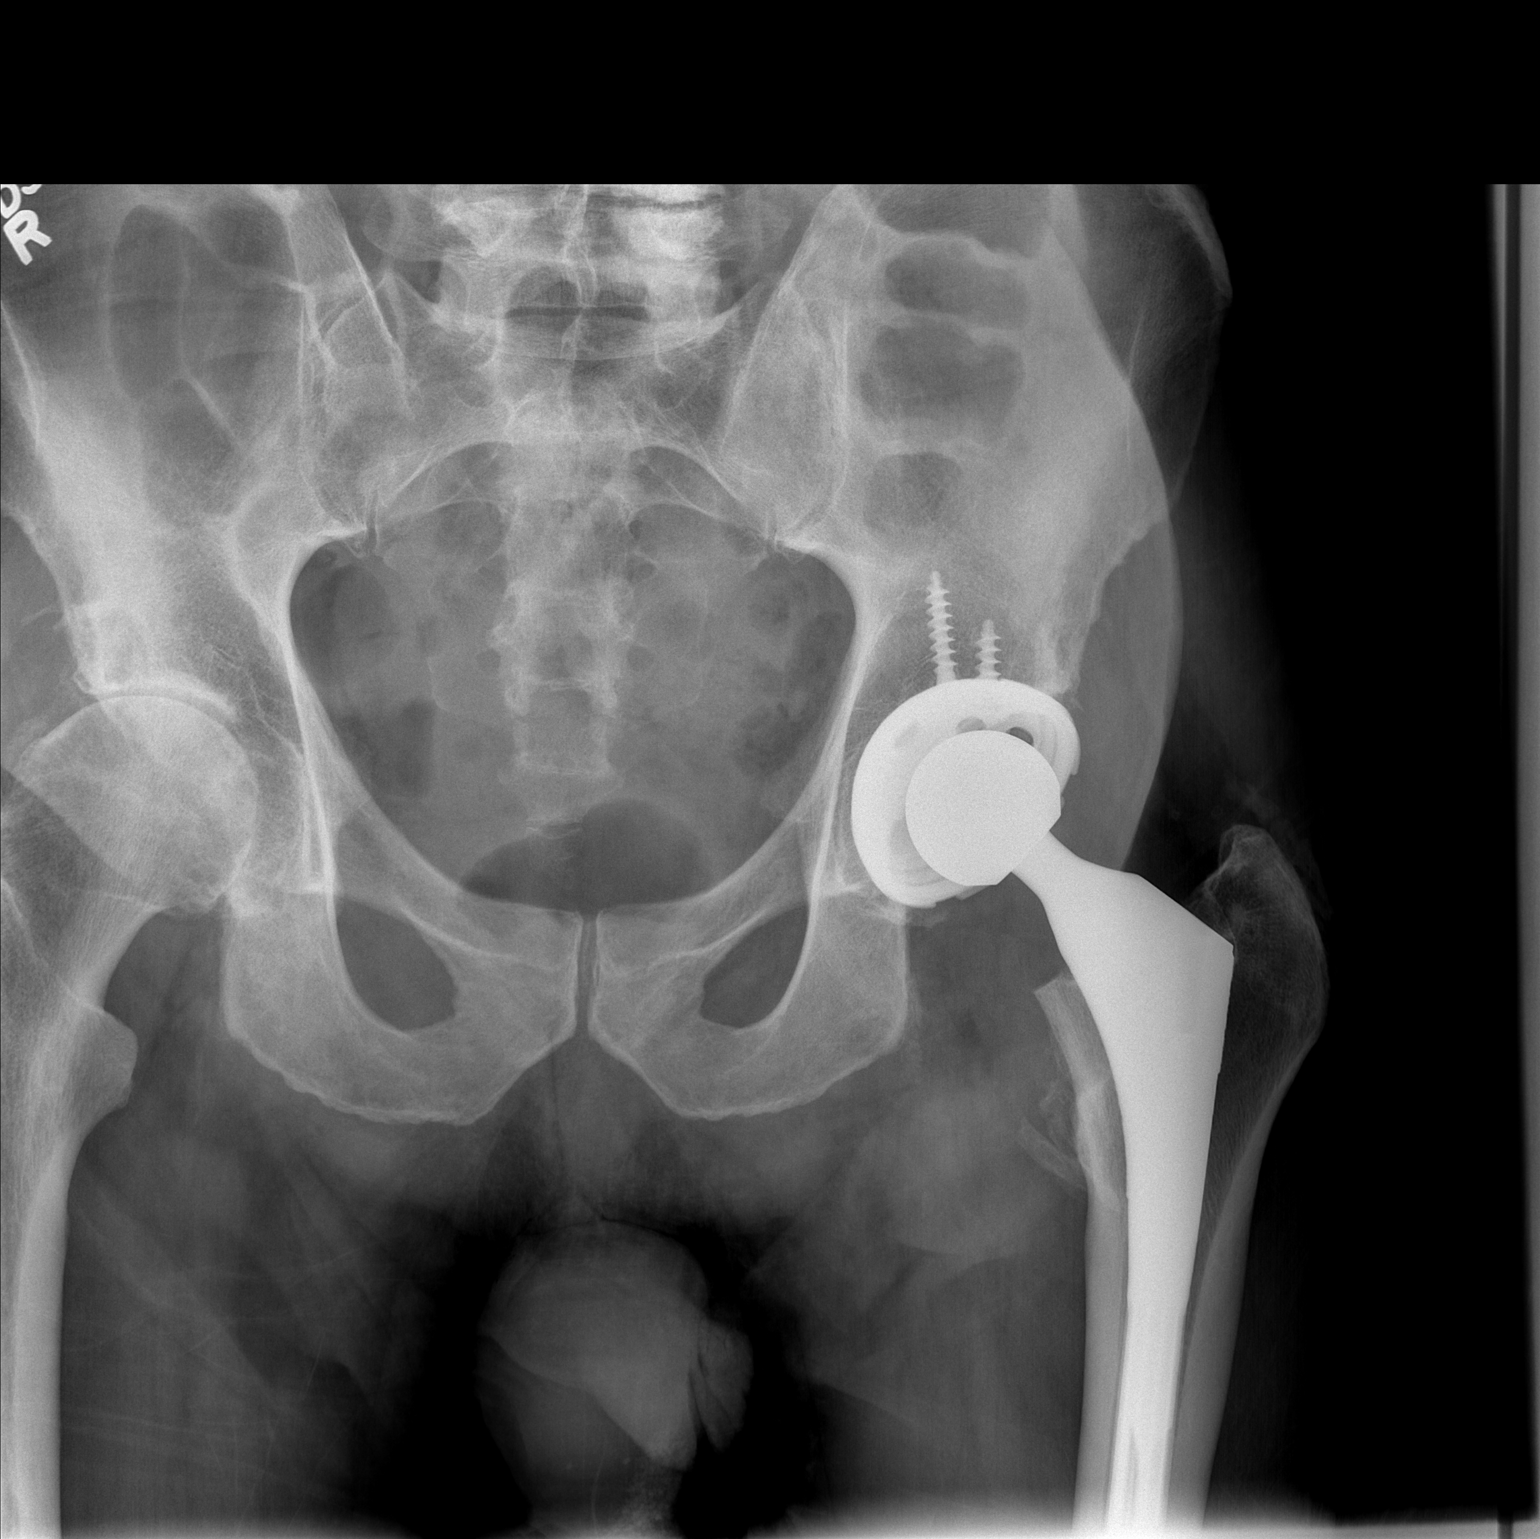

[2 of 2 positions shown; findings below may reference images not displayed]

FINDINGS: There is no fracture or dislocation or diastases. There is
moderately severe arthritis of the right hip with chondrocalcinosis.
Left hip prosthesis appears unchanged. Pelvic bones are intact.
IMPRESSION: No acute abnormality. Moderately severe arthritis of the right hip
with chondrocalcinosis.

## 2015-05-24 IMAGING — CT CT HEAD W/O CM
1 of 2 series · 16 of 30 positions shown, 20 images · non-contrast
Comparison: CT scan dated 09/05/2013.  MRI dated 09/05/2013

CLINICAL DATA: Two falls in the last week.

EXAM:
CT HEAD WITHOUT CONTRAST
TECHNIQUE: Contiguous axial images were obtained from the base of the skull
through the vertex without intravenous contrast.

[Series 3: head 2.0 h70h · axial · 0.43mm/px · z∈[+172,+326]mm · 16 of 87 slices shown, 20 images]
[im 5/87  brain]
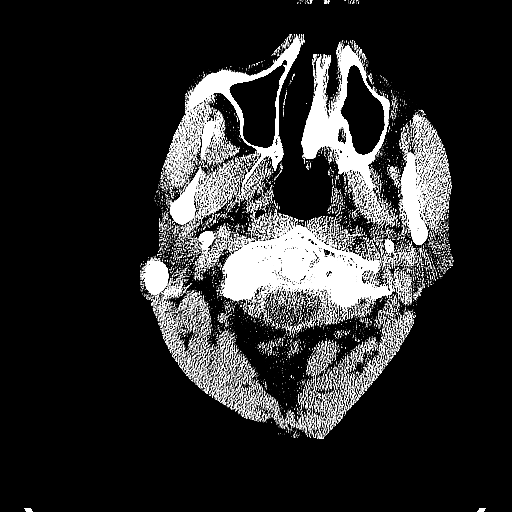
[im 5/87  bone]
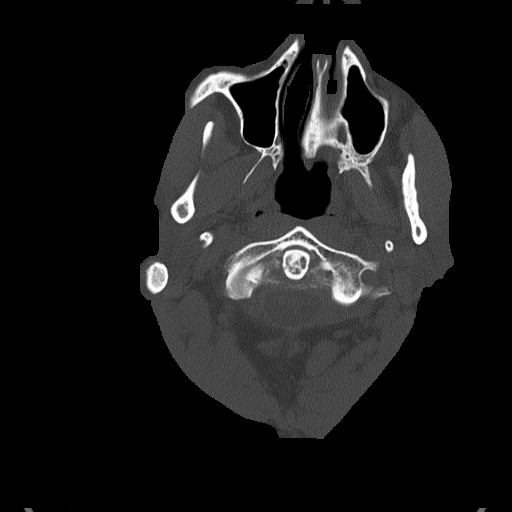
[im 9/87  brain]
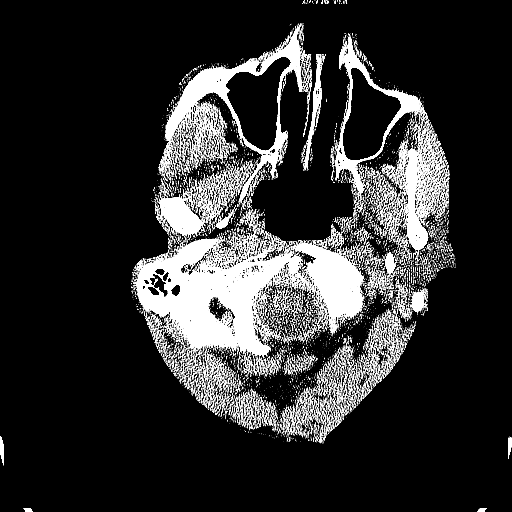
[im 13/87  brain]
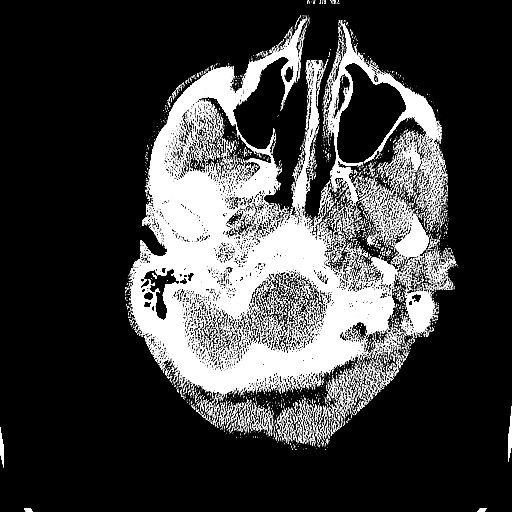
[im 22/87  brain]
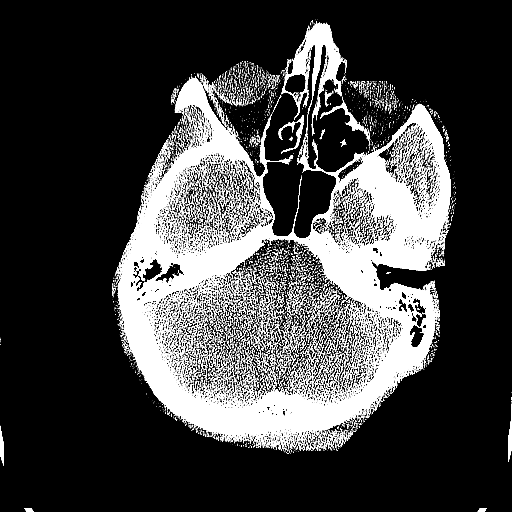
[im 26/87  brain]
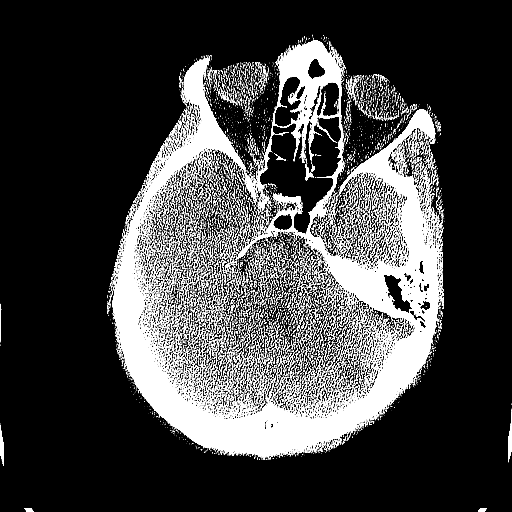
[im 26/87  bone]
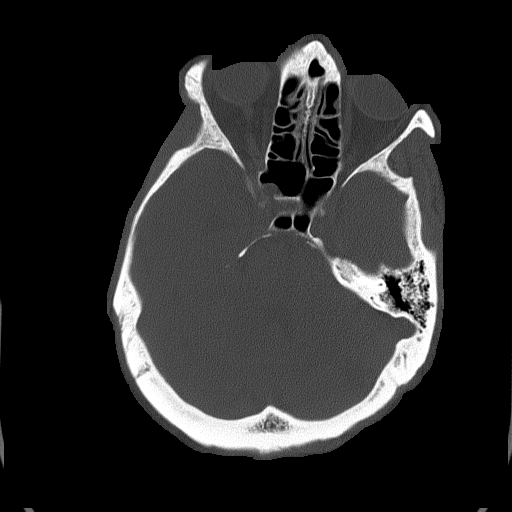
[im 31/87  brain]
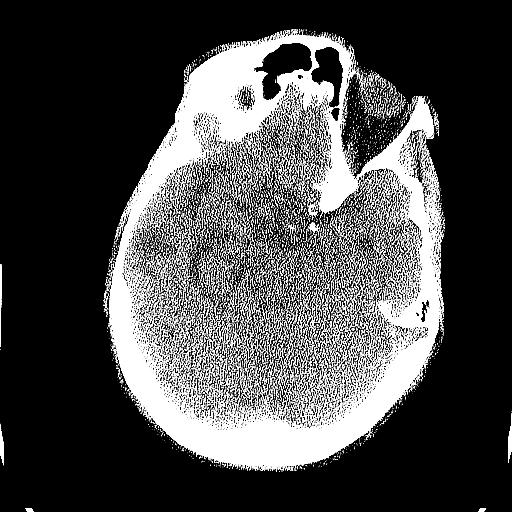
[im 35/87  brain]
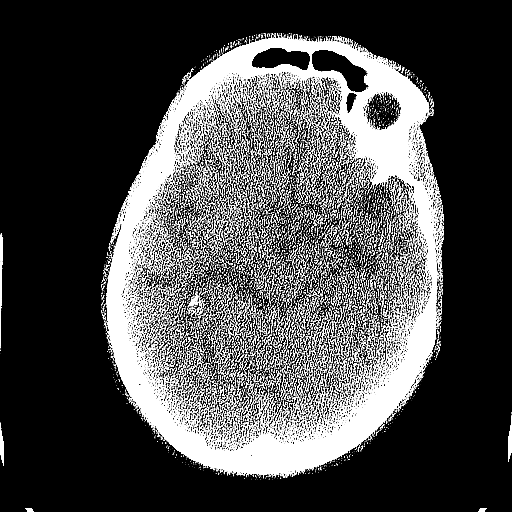
[im 39/87  brain]
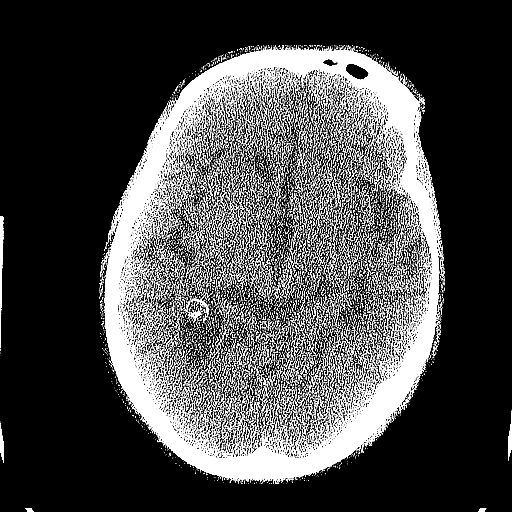
[im 48/87  brain]
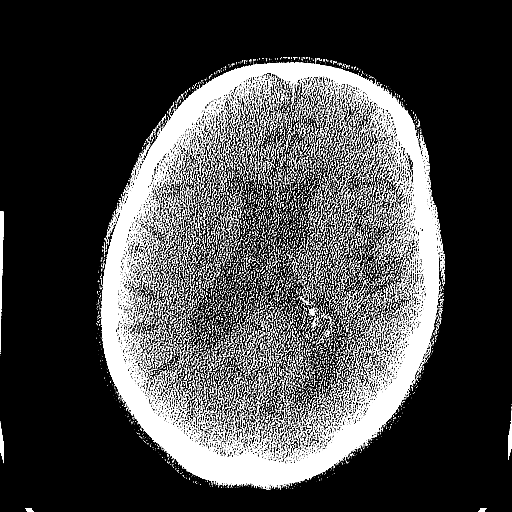
[im 48/87  bone]
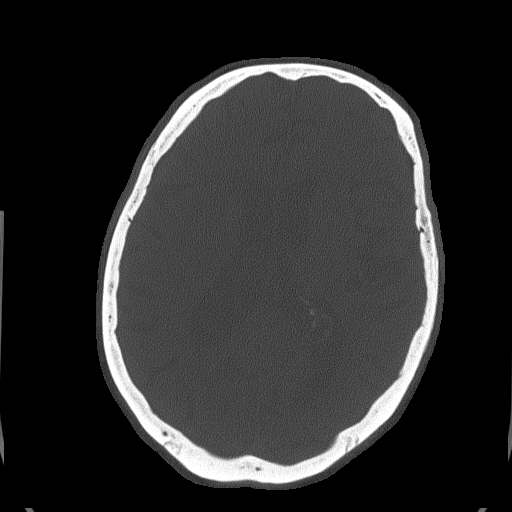
[im 52/87  brain]
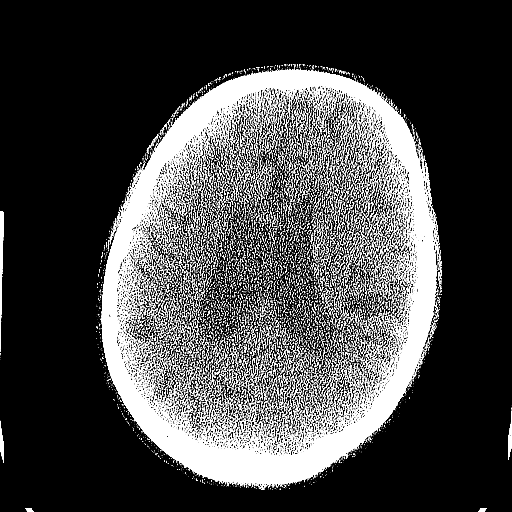
[im 56/87  brain]
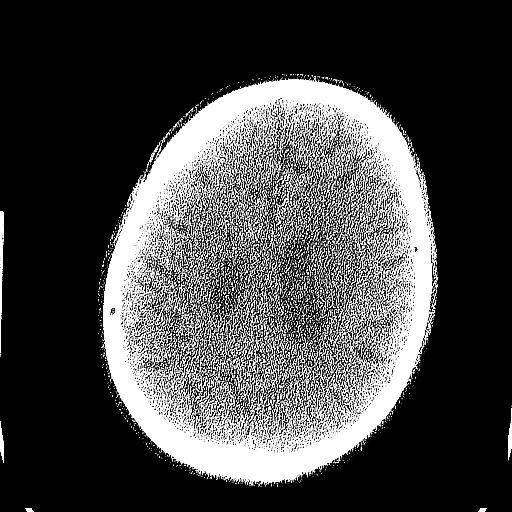
[im 61/87  brain]
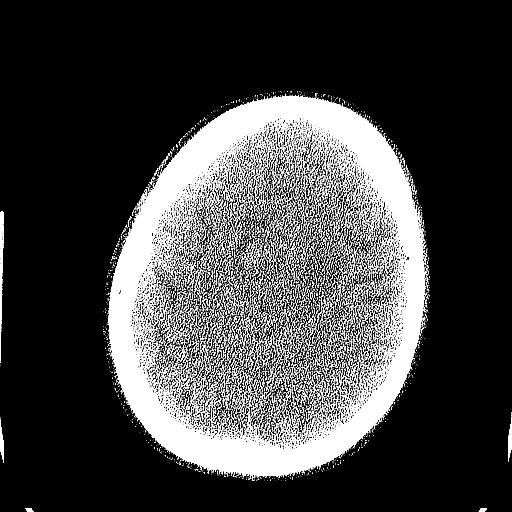
[im 65/87  brain]
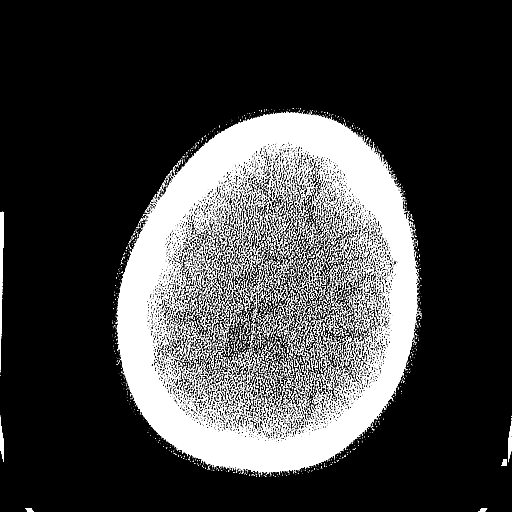
[im 65/87  bone]
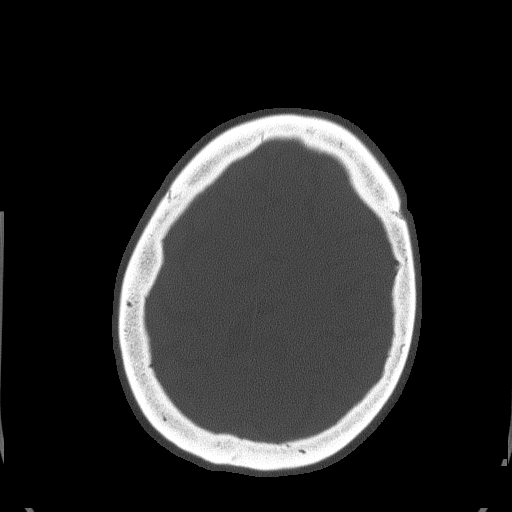
[im 74/87  brain]
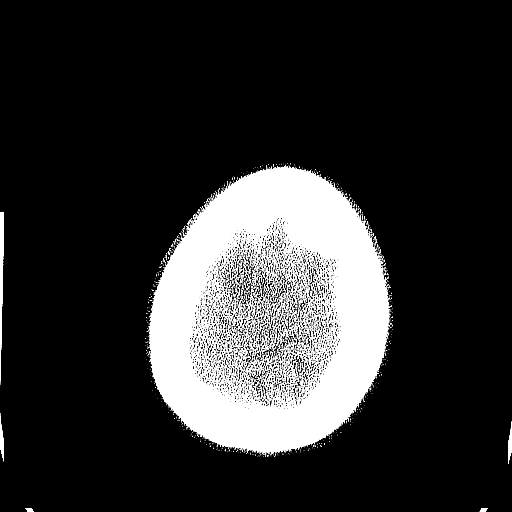
[im 78/87  brain]
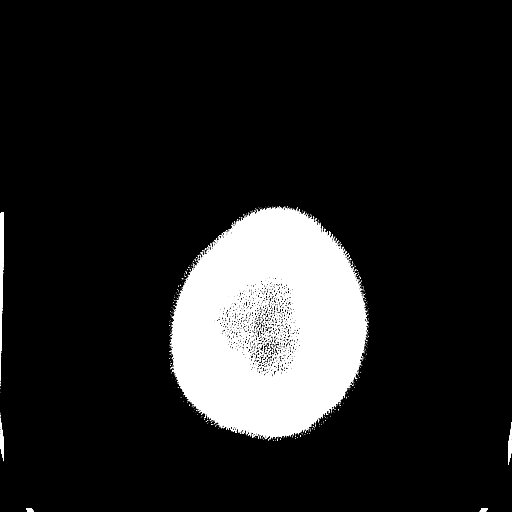
[im 82/87  brain]
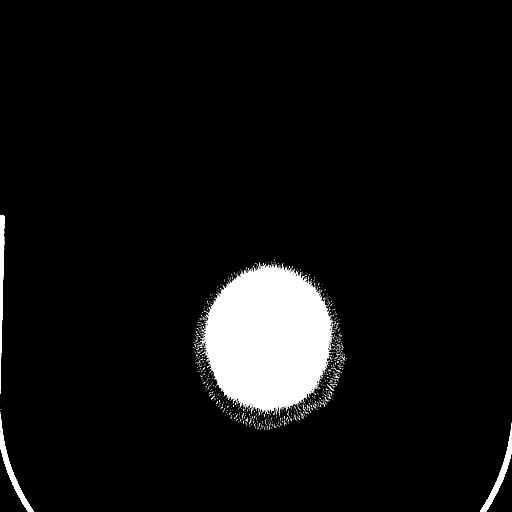

[16 of 30 positions shown; findings below may reference images not displayed]

FINDINGS: No mass lesion. No midline shift. No acute hemorrhage or hematoma.
No extra-axial fluid collections. No evidence of acute infarction.
There is diffuse cerebral cortical atrophy with secondary
ventricular dilatation. There are old tiny lacunar infarcts in the
periventricular white matter.

No osseous abnormality.
IMPRESSION: No acute intracranial abnormality. Chronic atrophy and chronic small
vessel ischemic disease with tiny lacunar infarcts, stable.

## 2015-05-31 ENCOUNTER — Other Ambulatory Visit: Payer: Self-pay | Admitting: Internal Medicine

## 2015-06-28 ENCOUNTER — Emergency Department (HOSPITAL_BASED_OUTPATIENT_CLINIC_OR_DEPARTMENT_OTHER)
Admission: EM | Admit: 2015-06-28 | Discharge: 2015-06-28 | Disposition: A | Discharge status: Home / Self Care | Payer: Medicare Other | Attending: Emergency Medicine | Admitting: Emergency Medicine

## 2015-06-28 ENCOUNTER — Emergency Department (HOSPITAL_BASED_OUTPATIENT_CLINIC_OR_DEPARTMENT_OTHER): Payer: Medicare Other

## 2015-06-28 ENCOUNTER — Encounter (HOSPITAL_BASED_OUTPATIENT_CLINIC_OR_DEPARTMENT_OTHER): Payer: Self-pay | Admitting: *Deleted

## 2015-06-28 DIAGNOSIS — I10 Essential (primary) hypertension: Secondary | ICD-10-CM | POA: Insufficient documentation

## 2015-06-28 DIAGNOSIS — Z79899 Other long term (current) drug therapy: Secondary | ICD-10-CM | POA: Insufficient documentation

## 2015-06-28 DIAGNOSIS — E785 Hyperlipidemia, unspecified: Secondary | ICD-10-CM | POA: Diagnosis not present

## 2015-06-28 DIAGNOSIS — M1990 Unspecified osteoarthritis, unspecified site: Secondary | ICD-10-CM | POA: Diagnosis not present

## 2015-06-28 DIAGNOSIS — J069 Acute upper respiratory infection, unspecified: Secondary | ICD-10-CM | POA: Insufficient documentation

## 2015-06-28 DIAGNOSIS — R05 Cough: Secondary | ICD-10-CM | POA: Diagnosis present

## 2015-06-28 DIAGNOSIS — E039 Hypothyroidism, unspecified: Secondary | ICD-10-CM | POA: Insufficient documentation

## 2015-06-28 DIAGNOSIS — F329 Major depressive disorder, single episode, unspecified: Secondary | ICD-10-CM | POA: Diagnosis not present

## 2015-06-28 DIAGNOSIS — Z87891 Personal history of nicotine dependence: Secondary | ICD-10-CM | POA: Insufficient documentation

## 2015-06-28 MED ORDER — LORATADINE 10 MG PO TABS: 10.0000 mg | ORAL_TABLET | Freq: Every day | ORAL | Status: DC

## 2015-06-28 NOTE — ED Provider Notes (Signed)
CSN: [id number]     Arrival date & time 06/28/15  1056 History   First MD Initiated Contact with Patient 06/28/15 1118     Chief Complaint  Patient presents with  . Cough     (Consider location/radiation/quality/duration/timing/severity/associated sxs/prior Treatment) HPI Comments: Patient presents with a cough. He has a history of hypertension, peripheral arterial disease, tremor, and unsteady gait who presents with a worsening cough over the last week. He states he's had a runny nose congestion and sneezing and a nonproductive cough. He feels congested in his chest. He denies any shortness of breath. No chest pain. No fevers. No nausea or vomiting. He started on a cough/decongestant medicine yesterday with no improvement in symptoms. He denies any leg pain or swelling. He denies any increased weakness or dizziness.  Patient is a 82 y.o. male presenting with cough.  Cough Associated symptoms: rhinorrhea   Associated symptoms: no chest pain, no chills, no diaphoresis, no fever, no headaches, no rash and no shortness of breath     Past Medical History  Diagnosis Date  . Migraines     Dr Jannifer Franklin  . Peripheral neuropathy ([place])   . Benign essential tremor   . Cervical stenosis of spine   . Anemia     B12 deficiency  . Testosterone deficiency     Dr Lawerance Bach, [facility]  . Hypothyroidism   . Hyperlipidemia   . Depression     Dr Albertine Patricia  . History of shingles 2009  . H/O cardiovascular stress test     a. 02/2003 -> no ischemia/infarct.  Marland Kitchen Hx of echocardiogram     Echocardiogram 08/01/12: Mild LVH, EF 55-60%, normal wall motion.  Marland Kitchen PAD (peripheral artery disease) ([place])   . Carotid stenosis     s/p L CEA  . Fecal impaction ([place]) 10/11/2012  . Macular edema   . Cervical spinal stenosis   . Hematoma of leg   . Acute urinary retention 01/28/2013  . AKI (acute kidney injury) ([place]) 01/26/2013  . Altered mental status 09/09/2013  . ANXIETY 06/07/2007    Qualifier: Diagnosis of  By: Talbert Cage  CMA ([place]), June    . ARTHRITIS 06/07/2007    Qualifier: Diagnosis of  By: Talbert Cage CMA ([place]), June    . Balance problems 04/05/2011  . Benign fibroma of prostate 10/11/2011  . Bilateral inguinal hernia 10/09/2012  . Bilateral leg edema 09/13/2012  . Bladder retention 02/05/2013  . Bradycardia, sinus 08/02/2012  . 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 CAROTID ARTERY DISEASE 03/31/2007    Qualifier: Diagnosis of  By: Linna Darner MD, Gwyndolyn Saxon    . Degeneration macular 11/02/2011  . Difficulty in walking(719.7) 04/05/2011  . ESOPHAGITIS 08/06/2002    Qualifier: Diagnosis of  By: Talbert Cage CMA ([place]), June    . Essential and other specified forms of tremor 04/18/2013  . Family history of colon cancer 07/24/2012  . Femoral hernia, bilateral s/p lap repair 01/16/2013 01/16/2013  . HAMMER TOE 04/23/2010    Qualifier: Diagnosis of  By: Linna Darner MD, Gwyndolyn Saxon    . Hip hematoma, right 09/09/2013  . HTN (hypertension) 08/22/2012  . Hydrocele 10/11/2011  . 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 MORTON'S NEUROMA, LEFT 08/22/2007    Qualifier: Diagnosis of  By: Linna Darner MD, Gwyndolyn Saxon    . Personal history of colonic polyps 01/05/2013    2014 2 mm adenomatous polyp   . PREMATURE ATRIAL CONTRACTIONS 03/14/2006    Qualifier: Diagnosis of  By: Linna Darner MD, Gwendolyn Lima 12/16/2011  .  SPINAL STENOSIS 03/14/2006    Qualifier: Diagnosis of  By: Linna Darner MD, William   Cervical spine    . Unspecified vitamin D deficiency 08/06/2012  . Urge incontinence 10/09/2012  . Urinary tract infection, site not specified 02/13/2013  . Weakness 08/02/2012  . Glaucoma, compensated 03/21/2014  . Cataract, nuclear 03/21/2014  . Critical lower limb ischemia   . Falls   . Macular degeneration disease    Past Surgical History  Procedure Laterality Date  . Appendectomy  1941  . Lumbar laminectomy    . Carotid endarterectomy  2005     L  . Tonsillectomy and adenoidectomy    . Total hip arthroplasty  2005    L  . Cataract extraction Right   . Eye  surgery Right 02/2012    retina  . Inguinal hernia repair Bilateral 01/16/2013    Procedure: LAPAROSCOPIC BILATERAL INGUINAL DIRECT AND INDIRECT, FEMORAL, AND OBTURATOR HERNIAS;  Surgeon: Adin Hector, MD;  Location: [place];  Service: General;  Laterality: Bilateral;  . Insertion of mesh N/A 01/16/2013    Procedure: INSERTION OF MESH;  Surgeon: Adin Hector, MD;  Location: [place];  Service: General;  Laterality: N/A;  . Breast cyst excision Left 01/16/2013    Procedure: MASS EXCISION AXILLA;  Surgeon: Adin Hector, MD;  Location: [place];  Service: General;  Laterality: Left;  . Colonoscopy  2014    Dr. Deatra Ina   Family History  Problem Relation Age of Onset  . Stroke Mother 14  . Hypertension Mother   . Diabetes Mother   . Heart disease Mother   . Rectal cancer Father 59  . Heart disease Father     in 70s  . Cancer Father     colorectal  . Nephritis Sister   . Seizures Brother     died as child   Social History  Substance Use Topics  . Smoking status: Former Smoker -- 10 years    Types: Pipe, Cigars    Quit date: 02/16/1979  . Smokeless tobacco: Never Used     Comment: Quit about age 71   . Alcohol Use: 2.4 oz/week    4 Standard drinks or equivalent per week     Comment:  occasional    Review of Systems  Constitutional: Negative for fever, chills, diaphoresis and fatigue.  HENT: Positive for congestion, postnasal drip, rhinorrhea and sneezing.   Eyes: Negative.   Respiratory: Positive for cough. Negative for chest tightness and shortness of breath.   Cardiovascular: Negative for chest pain and leg swelling.  Gastrointestinal: Negative for nausea, vomiting, abdominal pain, diarrhea and blood in stool.  Genitourinary: Negative for frequency, hematuria, flank pain and difficulty urinating.  Musculoskeletal: Negative for back pain and arthralgias.  Skin: Negative for rash.  Neurological: Negative for dizziness, speech difficulty, weakness, numbness and headaches.       Allergies  Latex and Tape  Home Medications   Prior to Admission medications   Medication Sig Start Date End Date Taking? Authorizing Provider  aspirin 81 MG tablet Take 81 mg by mouth every morning.     Historical Provider, MD  atorvastatin (LIPITOR) 20 MG tablet Take one tablet by mouth once daily in the evening for cholesterol 09/14/13   Estill Dooms, MD  atorvastatin (LIPITOR) 20 MG tablet TAKE ONE TABLET BY MOUTH ONCE DAILY FOR CHOLESTEROL 05/19/15   Estill Dooms, MD  B Complex-C (B-COMPLEX WITH VITAMIN C) tablet Take 1 tablet by mouth every morning.  Historical Provider, MD  bimatoprost (LUMIGAN) 0.01 % SOLN Place 1 drop into both eyes at bedtime.    Historical Provider, MD  brimonidine (ALPHAGAN) 0.15 % ophthalmic solution  06/25/14   Historical Provider, MD  buPROPion (WELLBUTRIN XL) 300 MG 24 hr tablet Take 300 mg by mouth every morning.     Historical Provider, MD  cholecalciferol (VITAMIN D) 1000 UNITS tablet Take 3,000 Units by mouth.     Historical Provider, MD  Flaxseed, Linseed, (FLAX SEEDS PO) Take 1 tablet by mouth every morning.     Historical Provider, MD  fluticasone (CUTIVATE) 0.05 % cream Apply twice daily to affected area 01/15/14   Historical Provider, MD  ketoconazole (NIZORAL) 2 % cream  06/30/14   Historical Provider, MD  Javier Docker Oil 500 MG CAPS Take 500 mg by mouth every morning.     Historical Provider, MD  L-Methylfolate 7.5 MG TABS Take 7.5 mg by mouth daily.    Historical Provider, MD  levothyroxine (SYNTHROID) 50 MCG tablet Take 1/2 tablet by mouth on Sunday mornings and Take one tablet by mouth every other morning for thryoid. 05/16/15   Estill Dooms, MD  LINZESS 290 MCG CAPS capsule TAKE ONE CAPSULE BY MOUTH ONCE DAILY 06/02/15   Estill Dooms, MD  loratadine (CLARITIN) 10 MG tablet Take 1 tablet (10 mg total) by mouth daily. One po daily x 5 days 06/28/15   Malvin Johns, MD  mirtazapine (REMERON) 45 MG tablet Take 45 mg by mouth at bedtime.     Historical Provider, MD  Multiple Vitamins-Minerals (ICAPS AREDS FORMULA PO) Take 1 capsule by mouth daily.     Historical Provider, MD  polyethylene glycol (MIRALAX / GLYCOLAX) packet Take 17 g by mouth daily as needed for mild constipation.    Historical Provider, MD  sertraline (ZOLOFT) 100 MG tablet Take two tablets by mouth once daily in the morning    Historical Provider, MD  SYNTHROID 50 MCG tablet TAKE 1/2 TABLET BY MOUTH ON SUNDAY MORNINGS AND ONE TABLET EVERY OTHER MORNING FOR THYROID 05/15/15   Estill Dooms, MD  tamsulosin (FLOMAX) 0.4 MG CAPS capsule TAKE 1 CAPSULE (0.4 MG TOTAL) BY MOUTH DAILY. 05/24/14   Historical Provider, MD  testosterone ([name]) 50 MG/5GM GEL Place 5 g onto the skin every morning.     Historical Provider, MD  topiramate (TOPAMAX) 25 MG tablet TAKE ONE TABLET IN THE MORNING AND TWO TABLETS IN THE EVENING 07/18/14   Dennie Bible, NP  topiramate (TOPAMAX) 25 MG tablet TAKE ONE TABLET IN THE MORNING AND TWO TABLETS IN THE EVENING 08/21/14   [name], MD   BP 140/73 mmHg  Pulse 41  Temp(Src) 99 F (37.2 C) (Oral)  Resp 16  Ht 6\' 2"  (1.88 m)  Wt 155 lb (70.308 kg)  BMI 19.89 kg/m2  SpO2 96% Physical Exam  Constitutional: He is oriented to person, place, and time. He appears well-developed and well-nourished.  HENT:  Head: Normocephalic and atraumatic.  Right Ear: External ear normal.  Left Ear: External ear normal.  Mouth/Throat: Oropharynx is clear and moist.  Eyes: Pupils are equal, round, and reactive to light.  Neck: Normal range of motion. Neck supple.  Cardiovascular: Normal rate, regular rhythm and normal heart sounds.   Pulmonary/Chest: Effort normal and breath sounds normal. No respiratory distress. He has no wheezes. He has no rales. He exhibits no tenderness.  Abdominal: Soft. Bowel sounds are normal. There is no tenderness. There is no rebound  and no guarding.  Musculoskeletal: Normal range of motion. He exhibits no edema.   Lymphadenopathy:    He has no cervical adenopathy.  Neurological: He is alert and oriented to person, place, and time.  Skin: Skin is warm and dry. No rash noted.  Psychiatric: He has a normal mood and affect.    ED Course  Procedures (including critical care time) Labs Review Labs Reviewed - No data to display  Imaging Review Dg Chest 2 View  06/28/2015  CLINICAL DATA:  Cough and congestion 1 week with some chest pain and headache. EXAM: CHEST  2 VIEW COMPARISON:  09/09/2013 FINDINGS: Lungs are adequately inflated without consolidation or effusion. Cardiomediastinal silhouette is within normal. Mild degenerate change of the spine. IMPRESSION: No active cardiopulmonary disease. Electronically Signed   By: Marin Olp M.D.   On: 06/28/2015 12:09   I have personally reviewed and evaluated these images and lab results as part of my medical decision-making.   EKG Interpretation None      MDM   Final diagnoses:  URI (upper respiratory infection)    Patient presents with cough for 1 week. He has no associated shortness of breath. He has normal oxygen saturations. His lungs are clear on exam. His chest x-rays negative for pneumonia. I Silva Bandy is likely a viral URI versus allergic rhinitis. Discharged home in good condition. He was given a prescription for Claritin to use for the next few days. I also advised his wife to get some Mucinex. Of note, his heart rate is in the 55s which his wife states is chronic. I did verify this on his last 2 visit notes, his heart rate was in the 40s. He's asymptomatic from this. He was advised to follow-up with his PCP if his symptoms are not improving or return here as needed for any worsening symptoms.    Malvin Johns, MD 06/28/15 [phone]

## 2015-06-28 NOTE — ED Notes (Signed)
Patient left before receiving discharge instructions, called patient to go over instructions and answer any questions

## 2015-06-28 NOTE — Discharge Instructions (Signed)
Upper Respiratory Infection, Adult Most upper respiratory infections (URIs) are a viral infection of the air passages leading to the lungs. A URI affects the nose, throat, and upper air passages. The most common type of URI is nasopharyngitis and is typically referred to as "the common cold." URIs run their course and usually go away on their own. Most of the time, a URI does not require medical attention, but sometimes a bacterial infection in the upper airways can follow a viral infection. This is called a secondary infection. Sinus and middle ear infections are common types of secondary upper respiratory infections. Bacterial pneumonia can also complicate a URI. A URI can worsen asthma and chronic obstructive pulmonary disease (COPD). Sometimes, these complications can require emergency medical care and may be life threatening.  CAUSES Almost all URIs are caused by viruses. A virus is a type of germ and can spread from one person to another.  RISKS FACTORS You may be at risk for a URI if:   You smoke.   You have chronic heart or lung disease.  You have a weakened defense (immune) system.   You are very young or very old.   You have nasal allergies or asthma.  You work in crowded or poorly ventilated areas.  You work in health care facilities or schools. SIGNS AND SYMPTOMS  Symptoms typically develop 2-3 days after you come in contact with a cold virus. Most viral URIs last 7-10 days. However, viral URIs from the influenza virus (flu virus) can last 14-18 days and are typically more severe. Symptoms may include:   Runny or stuffy (congested) nose.   Sneezing.   Cough.   Sore throat.   Headache.   Fatigue.   Fever.   Loss of appetite.   Pain in your forehead, behind your eyes, and over your cheekbones (sinus pain).  Muscle aches.  DIAGNOSIS  Your health care provider may diagnose a URI by:  Physical exam.  Tests to check that your symptoms are not due to  another condition such as:  Strep throat.  Sinusitis.  Pneumonia.  Asthma. TREATMENT  A URI goes away on its own with time. It cannot be cured with medicines, but medicines may be prescribed or recommended to relieve symptoms. Medicines may help:  Reduce your fever.  Reduce your cough.  Relieve nasal congestion. HOME CARE INSTRUCTIONS   Take medicines only as directed by your health care provider.   Gargle warm saltwater or take cough drops to comfort your throat as directed by your health care provider.  Use a warm mist humidifier or inhale steam from a shower to increase air moisture. This may make it easier to breathe.  Drink enough fluid to keep your urine clear or pale yellow.   Eat soups and other clear broths and maintain good nutrition.   Rest as needed.   Return to work when your temperature has returned to normal or as your health care provider advises. You may need to stay home longer to avoid infecting others. You can also use a face mask and careful hand washing to prevent spread of the virus.  Increase the usage of your inhaler if you have asthma.   Do not use any tobacco products, including cigarettes, chewing tobacco, or electronic cigarettes. If you need help quitting, ask your health care provider. PREVENTION  The best way to protect yourself from getting a cold is to practice good hygiene.   Avoid oral or hand contact with people with cold   symptoms.   Wash your hands often if contact occurs.  There is no clear evidence that vitamin C, vitamin E, echinacea, or exercise reduces the chance of developing a cold. However, it is always recommended to get plenty of rest, exercise, and practice good nutrition.  SEEK MEDICAL CARE IF:   You are getting worse rather than better.   Your symptoms are not controlled by medicine.   You have chills.  You have worsening shortness of breath.  You have brown or red mucus.  You have yellow or brown nasal  discharge.  You have pain in your face, especially when you bend forward.  You have a fever.  You have swollen neck glands.  You have pain while swallowing.  You have white areas in the back of your throat. SEEK IMMEDIATE MEDICAL CARE IF:   You have severe or persistent:  Headache.  Ear pain.  Sinus pain.  Chest pain.  You have chronic lung disease and any of the following:  Wheezing.  Prolonged cough.  Coughing up blood.  A change in your usual mucus.  You have a stiff neck.  You have changes in your:  Vision.  Hearing.  Thinking.  Mood. MAKE SURE YOU:   Understand these instructions.  Will watch your condition.  Will get help right away if you are not doing well or get worse.   This information is not intended to replace advice given to you by your health care provider. Make sure you discuss any questions you have with your health care provider.   Document Released: 07/28/2000 Document Revised: 06/18/2014 Document Reviewed: 05/09/2013 Elsevier Interactive Patient Education 2016 [employer]  

## 2015-06-28 NOTE — ED Notes (Signed)
Cough, headache and cold sx x 1 week

## 2015-08-15 ENCOUNTER — Other Ambulatory Visit: Payer: Self-pay | Admitting: Internal Medicine

## 2015-08-21 ENCOUNTER — Other Ambulatory Visit: Payer: Self-pay | Admitting: Internal Medicine

## 2015-09-04 ENCOUNTER — Other Ambulatory Visit: Payer: Self-pay | Admitting: Internal Medicine

## 2015-09-16 ENCOUNTER — Other Ambulatory Visit: Payer: Self-pay | Admitting: Neurology

## 2015-09-27 ENCOUNTER — Other Ambulatory Visit: Payer: Self-pay | Admitting: Internal Medicine

## 2015-10-12 ENCOUNTER — Other Ambulatory Visit: Payer: Self-pay | Admitting: Neurology

## 2015-10-13 ENCOUNTER — Other Ambulatory Visit: Payer: Self-pay | Admitting: Internal Medicine

## 2015-10-20 ENCOUNTER — Other Ambulatory Visit: Payer: Self-pay | Admitting: Internal Medicine

## 2015-10-20 ENCOUNTER — Other Ambulatory Visit: Payer: Self-pay | Admitting: Neurology

## 2015-10-21 ENCOUNTER — Other Ambulatory Visit: Payer: Self-pay | Admitting: Internal Medicine

## 2015-10-21 ENCOUNTER — Other Ambulatory Visit: Payer: Self-pay | Admitting: Neurology

## 2015-10-21 NOTE — Telephone Encounter (Signed)
Refill denied patient needs to schedule and appt, last seen 11/2014

## 2015-10-24 ENCOUNTER — Telehealth: Payer: Self-pay | Admitting: Nurse Practitioner

## 2015-10-24 MED ORDER — TOPIRAMATE 25 MG PO TABS: ORAL_TABLET | ORAL | 0 refills | Status: DC

## 2015-10-24 NOTE — Telephone Encounter (Signed)
Patient requesting refill of topiramate (TOPAMAX) 25 MG tablet Pharmacy [facility]

## 2015-10-24 NOTE — Telephone Encounter (Signed)
Pt has appt 10-27-15 with CM/NP.  Refilled for one month.

## 2015-10-27 ENCOUNTER — Other Ambulatory Visit: Payer: Self-pay | Admitting: Internal Medicine

## 2015-10-27 ENCOUNTER — Ambulatory Visit: Payer: Medicare Other | Admitting: Nurse Practitioner

## 2015-10-28 ENCOUNTER — Encounter: Payer: Self-pay | Admitting: Internal Medicine

## 2015-10-28 ENCOUNTER — Non-Acute Institutional Stay: Payer: Medicare Other | Admitting: Internal Medicine

## 2015-10-28 VITALS — BP 110/62 | HR 62 | Temp 97.5°F | Ht 74.0 in | Wt 150.0 lb

## 2015-10-28 DIAGNOSIS — R600 Localized edema: Secondary | ICD-10-CM | POA: Diagnosis not present

## 2015-10-28 DIAGNOSIS — E782 Mixed hyperlipidemia: Secondary | ICD-10-CM

## 2015-10-28 DIAGNOSIS — R29818 Other symptoms and signs involving the nervous system: Secondary | ICD-10-CM

## 2015-10-28 DIAGNOSIS — M4802 Spinal stenosis, cervical region: Secondary | ICD-10-CM | POA: Diagnosis not present

## 2015-10-28 DIAGNOSIS — E039 Hypothyroidism, unspecified: Secondary | ICD-10-CM

## 2015-10-28 DIAGNOSIS — I10 Essential (primary) hypertension: Secondary | ICD-10-CM | POA: Diagnosis not present

## 2015-10-28 DIAGNOSIS — L84 Corns and callosities: Secondary | ICD-10-CM

## 2015-10-28 DIAGNOSIS — R2689 Other abnormalities of gait and mobility: Secondary | ICD-10-CM

## 2015-10-28 NOTE — Progress Notes (Signed)
Facility  [facility]    Place of Service: [facility] (12)     Allergies  Allergen Reactions  . Latex Rash  . Tape Rash    Paper tape is ok to use     Chief Complaint  Patient presents with  . Medical Management of Chronic Issues    last seen 07/23/14, medication refills. Medication management thyroid, anxiety, blood pressure    HPI:  Balance problems - Persistent issue. Using walker.  Bilateral leg edema - unchanged and approximately 1+ bilaterally  Callus - very large callus ball of the right foot. Hemorrhage underneath the callus. Quite painful to walk.  Essential hypertension - controlled  HYPERLIPIDEMIA - needs follow up  Spinal stenosis of cervical region - off and on pains in the neck    Medications: Patient's Medications  New Prescriptions   No medications on file  Previous Medications   ASPIRIN 81 MG TABLET    Take 81 mg by mouth every morning.    ATORVASTATIN (LIPITOR) 20 MG TABLET    TAKE ONE TABLET BY MOUTH ONCE DAILY FOR CHOLESTEROL   B COMPLEX-C (B-COMPLEX WITH VITAMIN C) TABLET    Take 1 tablet by mouth every morning.   BIMATOPROST (LUMIGAN) 0.01 % SOLN    Place 1 drop into both eyes at bedtime.   BRIMONIDINE (ALPHAGAN) 0.15 % OPHTHALMIC SOLUTION       BUPROPION (WELLBUTRIN XL) 300 MG 24 HR TABLET    Take 300 mg by mouth every morning.    CHOLECALCIFEROL (VITAMIN D) 1000 UNITS TABLET    Take 3,000 Units by mouth.    CLONAZEPAM (KLONOPIN) 0.5 MG TABLET    Take one daily for anxiety   FLAXSEED, LINSEED, (FLAX SEEDS PO)    Take 1 tablet by mouth every morning.    FLUTICASONE (CUTIVATE) 0.05 % CREAM    Apply twice daily to affected area   KETOCONAZOLE (NIZORAL) 2 % CREAM       KRILL OIL 500 MG CAPS    Take 500 mg by mouth every morning.    L-METHYLFOLATE 7.5 MG TABS    Take 7.5 mg by mouth daily.   LEVOTHYROXINE (SYNTHROID) 50 MCG TABLET    Take 1/2 tablet by mouth on Sunday mornings and Take one tablet by mouth every other morning for thryoid.   LINZESS 290  MCG CAPS CAPSULE    TAKE ONE CAPSULE BY MOUTH ONCE DAILY   MIRTAZAPINE (REMERON) 45 MG TABLET    Take 45 mg by mouth at bedtime.   MULTIPLE VITAMINS-MINERALS (ICAPS AREDS FORMULA PO)    Take 1 capsule by mouth daily.    POLYETHYLENE GLYCOL (MIRALAX / GLYCOLAX) PACKET    Take 17 g by mouth daily as needed for mild constipation.   SERTRALINE (ZOLOFT) 100 MG TABLET    Take two tablets by mouth once daily in the morning   SYNTHROID 50 MCG TABLET    TAKE 1/2 TABLET BY MOUTH ON SUNDAY MORNINGS AND ONE TABLET BY MOUTH EVERY OTHER MORNING FOR THYROID. *NEEDS APPT   TAMSULOSIN (FLOMAX) 0.4 MG CAPS CAPSULE    TAKE 1 CAPSULE (0.4 MG TOTAL) BY MOUTH DAILY.   TESTOSTERONE ([name]) 50 MG/5GM GEL    Place 5 g onto the skin every morning.    TOPIRAMATE (TOPAMAX) 25 MG TABLET    TAKE ONE TABLET IN THE MORNING AND TWO TABLETS IN THE EVENING  Modified Medications   No medications on file  Discontinued Medications   LORATADINE (CLARITIN) 10  MG TABLET    Take 1 tablet (10 mg total) by mouth daily. One po daily x 5 days     Review of Systems  Constitutional: Positive for activity change and fatigue. Negative for appetite change, chills, diaphoresis, fever and unexpected weight change.       Complains of generalized weakness  HENT: Positive for hearing loss. Negative for sore throat, trouble swallowing and voice change.   Eyes: Positive for visual disturbance (corrective lenses).       History of wet macular degeneration  Respiratory: Positive for shortness of breath (On exertion).   Cardiovascular: Positive for leg swelling. Negative for chest pain and palpitations.  Gastrointestinal: Positive for constipation. Negative for abdominal distention, abdominal pain, nausea and vomiting.  Endocrine:       Hypothyroid. Testosterone deficiency.  Genitourinary: Positive for difficulty urinating, enuresis, frequency and urgency. Negative for discharge, dysuria, flank pain and hematuria.  Musculoskeletal: Positive for  arthralgias, back pain and gait problem.       Unstable gait and hx falls. Knee pains, particularly in the right knee. Cervical spinal stenosis with off and on pains in the neck.  Skin: Negative.   Neurological: Positive for tremors, weakness and headaches. Negative for dizziness, seizures, syncope, facial asymmetry, light-headedness and numbness.       Recent altered mentation when ill. Mild dementia. History of cerebral atrophy and cerebrovascular disease. History of cervical spondylosis with spinal cord stenosis.  Hematological: Negative.   Psychiatric/Behavioral: Negative for agitation. The patient is nervous/anxious.        Hx depression    Vitals:   10/28/15 1003  BP: 110/62  Pulse: 62  Temp: 97.5 F (36.4 C)  TempSrc: Oral  SpO2: 98%  Weight: 150 lb (68 kg)  Height: '6\' 2"'$  (1.88 m)   Wt Readings from Last 3 Encounters:  10/28/15 150 lb (68 kg)  06/28/15 155 lb (70.3 kg)  01/16/15 153 lb 6.4 oz (69.6 kg)    Body mass index is 19.26 kg/m.  Physical Exam  Constitutional: He is oriented to person, place, and time.  Thin, frail  HENT:  Head: Normocephalic and atraumatic.  Hearing loss  Eyes: Conjunctivae and EOM are normal. Pupils are equal, round, and reactive to light.  Neck: No JVD present. No tracheal deviation present. No thyromegaly present.  Cardiovascular: Normal rate, regular rhythm and normal heart sounds.  Exam reveals no gallop and no friction rub.   No murmur heard. Pulmonary/Chest: No respiratory distress. He has no wheezes. He has no rales. He exhibits no tenderness.  Abdominal: He exhibits no distension and no mass. There is no tenderness.  Musculoskeletal: Normal range of motion. He exhibits edema. He exhibits no tenderness.  Lurching, unstable gait. Using four-wheel walker. Right knee pain.  Lymphadenopathy:    He has no cervical adenopathy.  Neurological: He is alert and oriented to person, place, and time. No cranial nerve deficit. Coordination  abnormal.  Tremor bilaterally. Unstable gait.  Skin: No rash noted. No erythema. No pallor.  Each callus ball of right foot. Hemorrhage under the callus.  Psychiatric: He has a normal mood and affect. His behavior is normal. Thought content normal.     Labs reviewed: Lab Summary Latest Ref Rng & Units 12/02/2014 11/18/2014 07/11/2014  Hemoglobin 13.0 - 17.0 g/dL 14.3 (None) (None)  Hematocrit 39.0 - 52.0 % 44.1 (None) (None)  White count 4.0 - 10.5 K/uL 5.6 (None) (None)  Platelet count 150 - 400 K/uL 116(L) (None) (None)  Sodium 135 -  145 mmol/L 140 142 141  Potassium 3.5 - 5.1 mmol/L 4.6 4.6 4.2  Calcium 8.9 - 10.3 mg/dL 8.8(L) (None) (None)  Phosphorus - (None) (None) (None)  Creatinine 0.61 - 1.24 mg/dL 1.27(H) 1.2 1.4(A)  AST 14 - 40 U/L (None) (None) 18  Alk Phos 25 - 125 U/L (None) (None) 49  Bilirubin - (None) (None) (None)  Glucose 65 - 99 mg/dL 85 78 80  Cholesterol 0 - 200 mg/dL (None) 189 153  HDL cholesterol 35 - 70 mg/dL (None) 53 52  Triglycerides 40 - 160 mg/dL (None) 94 71  LDL Direct - (None) (None) (None)  LDL Calc mg/dL (None) 117 87  Total protein - (None) (None) (None)  Albumin - (None) (None) (None)  Some recent data might be hidden   Lab Results  Component Value Date   TSH 3.96 11/18/2014   Lab Results  Component Value Date   BUN 35 (H) 12/02/2014   BUN 31 (A) 11/18/2014   BUN 34 (A) 07/11/2014   Lab Results  Component Value Date   CREATININE 1.27 (H) 12/02/2014   CREATININE 1.2 11/18/2014   CREATININE 1.4 (A) 07/11/2014   Lab Results  Component Value Date   HGBA1C 5.3 07/31/2012   HGBA1C 5.4 03/16/2011   HGBA1C 5.4 02/26/2008       Assessment/Plan  1. Balance problems Continue to use walker  2. Bilateral leg edema Unchanged  3. Callus Sharply debrided course of today's visit. Large amount of callus was removed small amount of bleeding so an appointment hemorrhage is present.  4. Essential hypertension Controlled -CMP,  future  5. HYPERLIPIDEMIA - Lipid panel, future  6. Spinal stenosis of cervical region stable  7. Hypothyroidism, unspecified hypothyroidism type -TSH, future

## 2015-11-01 ENCOUNTER — Other Ambulatory Visit: Payer: Self-pay | Admitting: Internal Medicine

## 2015-11-18 ENCOUNTER — Other Ambulatory Visit: Payer: Self-pay | Admitting: Nurse Practitioner

## 2015-11-18 NOTE — Telephone Encounter (Signed)
LMVM for pt on home # to return call to make appt so as I could refill his medication (topamax).

## 2015-11-20 ENCOUNTER — Other Ambulatory Visit: Payer: Self-pay | Admitting: Nurse Practitioner

## 2015-11-24 ENCOUNTER — Other Ambulatory Visit: Payer: Self-pay | Admitting: Nurse Practitioner

## 2015-12-01 ENCOUNTER — Ambulatory Visit: Payer: Medicare Other | Admitting: Nurse Practitioner

## 2015-12-02 ENCOUNTER — Ambulatory Visit (HOSPITAL_COMMUNITY)
Admission: RE | Admit: 2015-12-02 | Discharge: 2015-12-02 | Disposition: A | Discharge status: Home / Self Care | Payer: Medicare Other | Source: Ambulatory Visit | Attending: Cardiovascular Disease | Admitting: Cardiovascular Disease

## 2015-12-02 ENCOUNTER — Other Ambulatory Visit (HOSPITAL_COMMUNITY): Payer: Self-pay | Admitting: Orthopedic Surgery

## 2015-12-02 DIAGNOSIS — M7989 Other specified soft tissue disorders: Secondary | ICD-10-CM | POA: Diagnosis not present

## 2015-12-02 DIAGNOSIS — M79661 Pain in right lower leg: Secondary | ICD-10-CM | POA: Insufficient documentation

## 2015-12-08 ENCOUNTER — Other Ambulatory Visit: Payer: Self-pay | Admitting: Internal Medicine

## 2015-12-10 ENCOUNTER — Other Ambulatory Visit: Payer: Self-pay

## 2015-12-10 DIAGNOSIS — E039 Hypothyroidism, unspecified: Secondary | ICD-10-CM

## 2015-12-10 DIAGNOSIS — E7849 Other hyperlipidemia: Secondary | ICD-10-CM

## 2015-12-10 DIAGNOSIS — I10 Essential (primary) hypertension: Secondary | ICD-10-CM

## 2015-12-10 NOTE — Addendum Note (Signed)
Addended by: Logan Bores on: 12/10/2015 10:13 AM   Modules accepted: Orders

## 2015-12-20 ENCOUNTER — Encounter (HOSPITAL_BASED_OUTPATIENT_CLINIC_OR_DEPARTMENT_OTHER): Payer: Self-pay | Admitting: Emergency Medicine

## 2015-12-20 ENCOUNTER — Emergency Department (HOSPITAL_BASED_OUTPATIENT_CLINIC_OR_DEPARTMENT_OTHER)
Admission: EM | Admit: 2015-12-20 | Discharge: 2015-12-20 | Disposition: A | Discharge status: Home / Self Care | Payer: Medicare Other | Attending: Emergency Medicine | Admitting: Emergency Medicine

## 2015-12-20 DIAGNOSIS — E039 Hypothyroidism, unspecified: Secondary | ICD-10-CM | POA: Insufficient documentation

## 2015-12-20 DIAGNOSIS — Z7982 Long term (current) use of aspirin: Secondary | ICD-10-CM | POA: Diagnosis not present

## 2015-12-20 DIAGNOSIS — I2510 Atherosclerotic heart disease of native coronary artery without angina pectoris: Secondary | ICD-10-CM | POA: Insufficient documentation

## 2015-12-20 DIAGNOSIS — I10 Essential (primary) hypertension: Secondary | ICD-10-CM

## 2015-12-20 DIAGNOSIS — Z79899 Other long term (current) drug therapy: Secondary | ICD-10-CM | POA: Insufficient documentation

## 2015-12-20 DIAGNOSIS — R001 Bradycardia, unspecified: Secondary | ICD-10-CM | POA: Diagnosis not present

## 2015-12-20 DIAGNOSIS — Z87891 Personal history of nicotine dependence: Secondary | ICD-10-CM | POA: Insufficient documentation

## 2015-12-20 DIAGNOSIS — R600 Localized edema: Secondary | ICD-10-CM | POA: Diagnosis not present

## 2015-12-20 DIAGNOSIS — N3001 Acute cystitis with hematuria: Secondary | ICD-10-CM | POA: Diagnosis not present

## 2015-12-20 DIAGNOSIS — R319 Hematuria, unspecified: Secondary | ICD-10-CM | POA: Diagnosis present

## 2015-12-20 LAB — COMPREHENSIVE METABOLIC PANEL
ALK PHOS: 41 U/L (ref 38–126)
ALT: 17 U/L (ref 17–63)
AST: 19 U/L (ref 15–41)
Albumin: 3.6 g/dL (ref 3.5–5.0)
Anion gap: 7 (ref 5–15)
BILIRUBIN TOTAL: 0.5 mg/dL (ref 0.3–1.2)
BUN: 33 mg/dL — AB (ref 6–20)
CALCIUM: 8.9 mg/dL (ref 8.9–10.3)
CHLORIDE: 111 mmol/L (ref 101–111)
CO2: 22 mmol/L (ref 22–32)
CREATININE: 1.14 mg/dL (ref 0.61–1.24)
GFR, EST NON AFRICAN AMERICAN: 58 mL/min — AB (ref >60)
Glucose, Bld: 89 mg/dL (ref 65–99)
Potassium: 4.2 mmol/L (ref 3.5–5.1)
Sodium: 140 mmol/L (ref 135–145)
TOTAL PROTEIN: 6.5 g/dL (ref 6.5–8.1)

## 2015-12-20 LAB — URINE MICROSCOPIC-ADD ON

## 2015-12-20 LAB — CBC WITH DIFFERENTIAL/PLATELET
Basophils Absolute: 0 10*3/uL (ref 0.0–0.1)
Basophils Relative: 0 %
EOS PCT: 2 %
Eosinophils Absolute: 0.1 10*3/uL (ref 0.0–0.7)
HEMATOCRIT: 44.1 % (ref 39.0–52.0)
Hemoglobin: 14.1 g/dL (ref 13.0–17.0)
LYMPHS ABS: 1 10*3/uL (ref 0.7–4.0)
LYMPHS PCT: 15 %
MCH: 30.3 pg (ref 26.0–34.0)
MCHC: 32 g/dL (ref 30.0–36.0)
MCV: 94.6 fL (ref 78.0–100.0)
Monocytes Absolute: 0.4 10*3/uL (ref 0.1–1.0)
Monocytes Relative: 6 %
NEUTROS ABS: 5.1 10*3/uL (ref 1.7–7.7)
Neutrophils Relative %: 77 %
PLATELETS: 127 10*3/uL — AB (ref 150–400)
RBC: 4.66 MIL/uL (ref 4.22–5.81)
RDW: 13.8 % (ref 11.5–15.5)
WBC: 6.7 10*3/uL (ref 4.0–10.5)

## 2015-12-20 LAB — URINALYSIS, ROUTINE W REFLEX MICROSCOPIC
Bilirubin Urine: NEGATIVE
GLUCOSE, UA: NEGATIVE mg/dL
KETONES UR: 15 mg/dL — AB
Nitrite: NEGATIVE
PROTEIN: 100 mg/dL — AB
Specific Gravity, Urine: 1.024 (ref 1.005–1.030)
pH: 5.5 (ref 5.0–8.0)

## 2015-12-20 MED ORDER — HYDRALAZINE HCL 10 MG PO TABS
10.0000 mg | ORAL_TABLET | Freq: Once | ORAL | Status: DC
  Filled 2015-12-20: qty 1

## 2015-12-20 MED ORDER — DEXTROSE 5 % IV SOLN
1.0000 g | Freq: Once | INTRAVENOUS | Status: AC
  Administered 2015-12-20: 1 g via INTRAVENOUS
  Filled 2015-12-20: qty 10

## 2015-12-20 MED ORDER — HYDRALAZINE HCL 25 MG PO TABS
25.0000 mg | ORAL_TABLET | Freq: Once | ORAL | Status: AC
  Administered 2015-12-20: 25 mg via ORAL

## 2015-12-20 MED ORDER — HYDRALAZINE HCL 25 MG PO TABS: 25.0000 mg | ORAL_TABLET | Freq: Once | ORAL | Status: DC

## 2015-12-20 MED ORDER — HYDRALAZINE HCL 25 MG PO TABS: 25.0000 mg | ORAL_TABLET | Freq: Four times a day (QID) | ORAL | 0 refills | Status: DC

## 2015-12-20 MED ORDER — HYDRALAZINE HCL 25 MG PO TABS
ORAL_TABLET | ORAL | Status: AC
  Filled 2015-12-20: qty 1

## 2015-12-20 MED ORDER — CEPHALEXIN 500 MG PO CAPS: 500.0000 mg | ORAL_CAPSULE | Freq: Three times a day (TID) | ORAL | 0 refills | Status: AC

## 2015-12-20 NOTE — ED Notes (Signed)
  Missed attempts for IV x 2 , tol well.

## 2015-12-20 NOTE — ED Triage Notes (Signed)
Pt in c/o dysuria x 1 week and hematuria x today. Pt alert, interactive, in NAD.

## 2015-12-20 NOTE — ED Notes (Signed)
States has been having blood in his urine today and pain to penis. Also does home I&O self caths. Denies n/v or abd pain

## 2015-12-20 NOTE — ED Notes (Signed)
Per Wife, Pt is prone to falls. Able to ambulate with walker at home. Does not have walker with him at this time, pt in wheelchair.

## 2015-12-20 NOTE — ED Provider Notes (Signed)
[facility] DEPT [facility] Provider Note   CSN: [id number] Arrival date & time: 12/20/15  1358  By signing my name below, I, Jose Leach, attest that this documentation has been prepared under the direction and in the presence of Gareth Morgan, MD.  Electronically Signed: Julien Nordmann, ED Scribe. 12/20/15. 4:56 PM.    History   Chief Complaint Chief Complaint  Patient presents with  . Hematuria    The history is provided by the patient. No language interpreter was used.   HPI Comments: Jose Leach is a 82 y.o. male who has a PMhx of HLD, HTN, hypothyroidism, bradycardia, spinal stenosis, benign essential tremor and acute urinary retention presents to the Emergency Department complaining of constant, gradual worsening, dysuria x 1 week. He notes associated hematuria that started today. Pt uses a self-catheter when urinating every few days. He reports that his eye sight is not the greatest and notes having difficulty placing the catheter. He expresses that he has to force the catheter inside and notes that may be a possible cause. He notes having a recent change to his medication by adding Clonazepam to help with his benign essential tremor. Per chart review, pt was seen in October37yr ago having an HR in the 40s. He has a hx of bradycardia in the past. Pt is not on any blood pressure medication. Denies chest pain, shortness of breath, light-headedness, fatigue, abdominal pain, back pain, flank pain, fever, numbness, weakness, facial droop, or cough. Pt allergic to latex and tape but has no other known drug allergies.  PCP: Estill Dooms, MD  Urologist: Tresa Endo, MD Cardiologist: Janett Labella, MD  Past Medical History:  Diagnosis Date  . Acute urinary retention 01/28/2013  . AKI (acute kidney injury) ([place]) 01/26/2013  . Altered mental status 09/09/2013  . Anemia    B12 deficiency  . ANXIETY 06/07/2007   Qualifier: Diagnosis of  By: Talbert Cage CMA ([place]), June    .  ARTHRITIS 06/07/2007   Qualifier: Diagnosis of  By: Talbert Cage CMA ([place]), June    . Balance problems 04/05/2011  . Benign essential tremor   . Benign fibroma of prostate 10/11/2011  . Bilateral inguinal hernia 10/09/2012  . Bilateral leg edema 09/13/2012  . Bladder retention 02/05/2013  . Bradycardia, sinus 08/02/2012  . CAROTID ARTERY DISEASE 03/31/2007   Qualifier: Diagnosis of  By: Linna Darner MD, Gwyndolyn Saxon    . Carotid stenosis    s/p L CEA  . Cataract, nuclear 03/21/2014  . 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 Cervical spinal stenosis   . Cervical stenosis of spine   . Critical lower limb ischemia   . Degeneration macular 11/02/2011  . Depression    Dr Albertine Patricia  . Difficulty in walking(719.7) 04/05/2011  . ESOPHAGITIS 08/06/2002   Qualifier: Diagnosis of  By: Talbert Cage CMA ([place]), June    . Essential and other specified forms of tremor 04/18/2013  . Falls   . Family history of colon cancer 07/24/2012  . Fecal impaction ([place]) 10/11/2012  . Femoral hernia, bilateral s/p lap repair 01/16/2013 01/16/2013  . Glaucoma, compensated 03/21/2014  . H/O cardiovascular stress test    a. 02/2003 -> no ischemia/infarct.  Marland Kitchen HAMMER TOE 04/23/2010   Qualifier: Diagnosis of  By: Linna Darner MD, Gwyndolyn Saxon    . Hematoma of leg   . Hip hematoma, right 09/09/2013  . History of shingles 2009  . HTN (hypertension) 08/22/2012  . Hx of echocardiogram    Echocardiogram 08/01/12: Mild LVH, EF 55-60%, normal wall motion.  Marland Kitchen  Hydrocele 10/11/2011  . Hyperlipidemia   . 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 Hypothyroidism   . Macular degeneration disease   . Macular edema   . Migraines    Dr Jannifer Franklin  . MORTON'S NEUROMA, LEFT 08/22/2007   Qualifier: Diagnosis of  By: Linna Darner MD, Gwyndolyn Saxon    . PAD (peripheral artery disease) ([place])   . Peripheral neuropathy ([place])   . Personal history of colonic polyps 01/05/2013   2014 2 mm adenomatous polyp   . PREMATURE ATRIAL CONTRACTIONS 03/14/2006   Qualifier: Diagnosis of  By: Linna Darner MD, Gwendolyn Lima 12/16/2011  . SPINAL STENOSIS 03/14/2006   Qualifier: Diagnosis of  By: Linna Darner MD, William   Cervical spine    . Testosterone deficiency    Dr Lawerance Bach, [facility]  . Unspecified vitamin D deficiency 08/06/2012  . Urge incontinence 10/09/2012  . Urinary tract infection, site not specified 02/13/2013  . Weakness 08/02/2012    Patient Active Problem List   Diagnosis Date Noted  . Callus 07/23/2014  . Fall 07/09/2014  . Depression 07/09/2014  . Pain in left hip 05/22/2014  . Candida infection 05/22/2014  . AMD (age-related macular degeneration), wet ([place]) 03/21/2014  . Cataract, nuclear 03/21/2014  . Glaucoma, compensated 03/21/2014  . Benign prostatic hyperplasia with urinary obstruction 12/27/2013  . Conjunctivitis 10/02/2013  . Knee pain, right 09/25/2013  . Bilateral buttock pain 09/18/2013  . Other testicular hypofunction 09/14/2013  . Hip hematoma, right 09/09/2013  . Altered mental status 09/09/2013  . Benign essential tremor 04/18/2013  . 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 Bladder retention 02/05/2013  . Renal cyst, left 01/30/2013  . Mass of left axilla - prob cyst 01/16/2013  . Femoral hernia, bilateral s/p lap repair 01/16/2013 01/16/2013  . Personal history of colonic polyps 01/05/2013  . Bilateral inguinal hernia 10/09/2012  . Urge incontinence 10/09/2012  . Bilateral leg edema 09/13/2012  . HTN (hypertension) 08/22/2012  . Unspecified vitamin D deficiency 08/06/2012  . Weakness 08/02/2012  . Bradycardia, sinus 08/02/2012  . Chronic constipation 07/24/2012  . Family history of colon cancer 07/24/2012  . Pseudoaphakia 12/16/2011  . Degeneration macular 11/02/2011  . Benign fibroma of prostate 10/11/2011  . Bladder neck obstruction 10/11/2011  . Hydrocele in adult 10/11/2011  . 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 Difficulty walking 04/05/2011  . Balance problems 04/05/2011  . Hammer toe 04/23/2010  . ABNORMAL ELECTROCARDIOGRAM 04/16/2009  .  MORTON'S NEUROMA, LEFT 08/22/2007  . Anxiety state 06/07/2007  . ARTHRITIS 06/07/2007  . CAROTID ARTERY DISEASE 03/31/2007  . Peripheral vascular disease ([place]) 03/16/2007  . Hypothyroidism 11/10/2006  . HYPERLIPIDEMIA 11/10/2006  . MIGRAINE HEADACHE 03/14/2006  . PREMATURE ATRIAL CONTRACTIONS 03/14/2006  . Spinal stenosis of cervical region 03/14/2006  . ESOPHAGITIS 08/06/2002    Past Surgical History:  Procedure Laterality Date  . APPENDECTOMY  1941  . BREAST CYST EXCISION Left 01/16/2013   Procedure: MASS EXCISION AXILLA;  Surgeon: Adin Hector, MD;  Location: [facility];  Service: General;  Laterality: Left;  . CAROTID ENDARTERECTOMY  2005    L  . CATARACT EXTRACTION Right   . COLONOSCOPY  2014   Dr. Deatra Ina  . EYE SURGERY Right 02/2012   retina  . INGUINAL HERNIA REPAIR Bilateral 01/16/2013   Procedure: LAPAROSCOPIC BILATERAL INGUINAL DIRECT AND INDIRECT, FEMORAL, AND OBTURATOR HERNIAS;  Surgeon: Adin Hector, MD;  Location: [facility];  Service: General;  Laterality: Bilateral;  . INSERTION OF MESH N/A 01/16/2013   Procedure: INSERTION OF  MESH;  Surgeon: Adin Hector, MD;  Location: [facility];  Service: General;  Laterality: N/A;  . LUMBAR LAMINECTOMY    . TONSILLECTOMY AND ADENOIDECTOMY    . TOTAL HIP ARTHROPLASTY  2005   L       Home Medications    Prior to Admission medications   Medication Sig Start Date End Date Taking? Authorizing Provider  aspirin 81 MG tablet Take 81 mg by mouth every morning.     Historical Provider, MD  atorvastatin (LIPITOR) 20 MG tablet TAKE ONE TABLET BY MOUTH ONCE DAILY FOR CHOLESTEROL 10/21/15   Estill Dooms, MD  B Complex-C (B-COMPLEX WITH VITAMIN C) tablet Take 1 tablet by mouth every morning.    Historical Provider, MD  bimatoprost (LUMIGAN) 0.01 % SOLN Place 1 drop into both eyes at bedtime.    Historical Provider, MD  brimonidine (ALPHAGAN) 0.15 % ophthalmic solution  06/25/14   Historical Provider, MD  buPROPion (WELLBUTRIN XL) 300 MG 24  hr tablet Take 300 mg by mouth every morning.     Historical Provider, MD  cephALEXin (KEFLEX) 500 MG capsule Take 1 capsule (500 mg total) by mouth 3 (three) times daily. 12/20/15 12/30/15  Gareth Morgan, MD  cholecalciferol (VITAMIN D) 1000 UNITS tablet Take 3,000 Units by mouth.     Historical Provider, MD  clonazePAM (KLONOPIN) 0.5 MG tablet Take one daily for anxiety 10/14/15   Historical Provider, MD  Flaxseed, Linseed, (FLAX SEEDS PO) Take 1 tablet by mouth every morning.     Historical Provider, MD  fluticasone (CUTIVATE) 0.05 % cream Apply twice daily to affected area 01/15/14   Historical Provider, MD  hydrALAZINE (APRESOLINE) 25 MG tablet Take 1 tablet (25 mg total) by mouth 4 (four) times daily. Follow up with your regular doctor to discuss titration of medication 12/20/15 01/19/16  Gareth Morgan, MD  ketoconazole (NIZORAL) 2 % cream  06/30/14   Historical Provider, MD  Javier Docker Oil 500 MG CAPS Take 500 mg by mouth every morning.     Historical Provider, MD  L-Methylfolate 7.5 MG TABS Take 7.5 mg by mouth daily.    Historical Provider, MD  levothyroxine (SYNTHROID) 50 MCG tablet Take 1/2 tablet by mouth on Sunday mornings and Take one tablet by mouth every other morning for thryoid. 05/16/15   Estill Dooms, MD  LINZESS 290 MCG CAPS capsule TAKE ONE CAPSULE BY MOUTH DAILY 12/08/15   Tiffany L Reed, DO  mirtazapine (REMERON) 45 MG tablet Take 45 mg by mouth at bedtime.    Historical Provider, MD  Multiple Vitamins-Minerals (ICAPS AREDS FORMULA PO) Take 1 capsule by mouth daily.     Historical Provider, MD  polyethylene glycol (MIRALAX / GLYCOLAX) packet Take 17 g by mouth daily as needed for mild constipation.    Historical Provider, MD  sertraline (ZOLOFT) 100 MG tablet Take two tablets by mouth once daily in the morning    Historical Provider, MD  SYNTHROID 50 MCG tablet TAKE 1/2 TABLET BY MOUTH ON SUNDAY MORNINGS AND ONE TABLET BY MOUTH EVERY OTHER MORNING FOR THYROID. *NEEDS APPT 10/27/15    Estill Dooms, MD  tamsulosin (FLOMAX) 0.4 MG CAPS capsule TAKE 1 CAPSULE (0.4 MG TOTAL) BY MOUTH DAILY. 05/24/14   Historical Provider, MD  testosterone ([name]) 50 MG/5GM GEL Place 5 g onto the skin every morning.     Historical Provider, MD  topiramate (TOPAMAX) 25 MG tablet TAKE 1 TABLET BY MOUTH EVERY MORNING AND 2 TABLETS IN THE EVENING  11/24/15   Dennie Bible, NP    Family History Family History  Problem Relation Age of Onset  . Stroke Mother 64  . Hypertension Mother   . Diabetes Mother   . Heart disease Mother   . Rectal cancer Father 71  . Heart disease Father     in 41s  . Cancer Father     colorectal  . Nephritis Sister   . Seizures Brother     died as child    Social History Social History  Substance Use Topics  . Smoking status: Former Smoker    Years: 10.00    Types: Pipe, Cigars    Quit date: 02/16/1979  . Smokeless tobacco: Never Used     Comment: Quit about age 30   . Alcohol use 2.4 oz/week    4 Standard drinks or equivalent per week     Comment:  occasional     Allergies   Latex and Tape   Review of Systems Review of Systems  Constitutional: Negative for fatigue and fever.  HENT: Negative for sore throat.   Eyes: Negative for visual disturbance.  Respiratory: Negative for cough and shortness of breath.   Cardiovascular: Negative for chest pain.  Gastrointestinal: Negative for abdominal pain, diarrhea, nausea and vomiting.  Genitourinary: Positive for dysuria and hematuria. Negative for difficulty urinating and flank pain.  Musculoskeletal: Negative for back pain and neck stiffness.  Skin: Negative for rash.  Neurological: Negative for syncope, facial asymmetry, speech difficulty, weakness, light-headedness, numbness and headaches.     Physical Exam Updated Vital Signs BP 181/72 (BP Location: Right Arm)   Pulse (!) 52   Temp 98.9 F (37.2 C)   Resp 18   Wt 155 lb (70.3 kg)   SpO2 100%   BMI 19.90 kg/m   Physical Exam    Constitutional: He is oriented to person, place, and time. He appears well-developed and well-nourished. No distress.  Chronic tremor  HENT:  Head: Normocephalic and atraumatic.  Eyes: Conjunctivae and EOM are normal.  Neck: Normal range of motion.  Cardiovascular: Regular rhythm, normal heart sounds and intact distal pulses.  Bradycardia present.  Exam reveals no gallop and no friction rub.   No murmur heard. Pulmonary/Chest: Effort normal and breath sounds normal. No respiratory distress. He has no wheezes. He has no rales.  Abdominal: Soft. He exhibits no distension. There is no tenderness. There is no guarding.  Musculoskeletal: Normal range of motion. He exhibits no edema.  2+ bilateral edema, 2+ radial pulses, no CVA tenderness  Neurological: He is alert and oriented to person, place, and time. He displays tremor. GCS eye subscore is 4. GCS verbal subscore is 5. GCS motor subscore is 6.  Skin: Skin is warm and dry. He is not diaphoretic.  Psychiatric: He has a normal mood and affect. Judgment normal.  Nursing note and vitals reviewed.    ED Treatments / Results  DIAGNOSTIC STUDIES: Oxygen Saturation is 100% on RA, normal by my interpretation.  COORDINATION OF CARE:  4:46 PM Discussed treatment plan with pt at bedside and pt agreed to plan. Will order blood work, apresoline, and rocephin.   Labs (all labs ordered are listed, but only abnormal results are displayed) Labs Reviewed  URINALYSIS, ROUTINE W REFLEX MICROSCOPIC (NOT AT [facility]) - Abnormal; Notable for the following:       Result Value   Color, Urine AMBER (*)    APPearance TURBID (*)    Hgb urine dipstick LARGE (*)  Ketones, ur 15 (*)    Protein, ur 100 (*)    Leukocytes, UA LARGE (*)    All other components within normal limits  URINE MICROSCOPIC-ADD ON - Abnormal; Notable for the following:    Squamous Epithelial / LPF 0-5 (*)    Bacteria, UA MANY (*)    All other components within normal limits  CBC WITH  DIFFERENTIAL/PLATELET - Abnormal; Notable for the following:    Platelets 127 (*)    All other components within normal limits  COMPREHENSIVE METABOLIC PANEL - Abnormal; Notable for the following:    BUN 33 (*)    GFR calc non Af Amer 58 (*)    All other components within normal limits  URINE CULTURE    EKG  EKG Interpretation  Date/Time:  Saturday December 20 2015 16:33:00 EDT Ventricular Rate:  48 PR Interval:    QRS Duration: 107 QT Interval:  474 QTC Calculation: 424 R Axis:   39 Text Interpretation:  Sinus bradycardia with sinus arrhythmia No significant change since last tracing Confirmed by [facility] MD, Dia Donate ([id number]) on 12/21/2015 12:37:29 PM       Radiology No results found.  Procedures Procedures (including critical care time)  Medications Ordered in ED Medications  cefTRIAXone (ROCEPHIN) 1 g in dextrose 5 % 50 mL IVPB (0 g Intravenous Stopped 12/20/15 1829)  hydrALAZINE (APRESOLINE) tablet 25 mg (25 mg Oral Given 12/20/15 1747)     Initial Impression / Assessment and Plan / ED Course  I have reviewed the triage vital signs and the nursing notes.  Pertinent labs & imaging results that were available during my care of the patient were reviewed by me and considered in my medical decision making (see chart for details).  Clinical Course    82yo male presents with concern for dysuria and hematuria.  Patient with history of bradycardia and heart rate appears baseline on chart review, sinus bradycardia, no lightheadedness.  Patient with hypertension today which is new, [id number] systolic.  Given 25mg  hydralazine and bp down to [id number] systolic. No symptoms to suggest htn emergency. Will initiate hydralazine for bp given htn and bradycardia and recommend close PCP follow up.   Labs at baseline.  Pt with UTI, no sign of pyelonephritis or sepsis.  Given rx for keflex. Patient discharged in stable condition with understanding of reasons to return.   Final Clinical Impressions(s) /  ED Diagnoses   Final diagnoses:  Acute cystitis with hematuria  Essential hypertension   I personally performed the services described in this documentation, which was scribed in my presence. The recorded information has been reviewed and is accurate.   New Prescriptions Discharge Medication List as of 12/20/2015  6:45 PM    START taking these medications   Details  cephALEXin (KEFLEX) 500 MG capsule Take 1 capsule (500 mg total) by mouth 3 (three) times daily., Starting Sat 12/20/2015, Until Tue 12/30/2015, Print    hydrALAZINE (APRESOLINE) 25 MG tablet Take 1 tablet (25 mg total) by mouth 4 (four) times daily. Follow up with your regular doctor to discuss titration of medication, Starting Sat 12/20/2015, Until Mon 01/19/2016, Print         Gareth Morgan, MD 12/21/15 1257

## 2015-12-20 NOTE — ED Notes (Signed)
Pt taken to restroom via wheelchair and stood and pivot to toilet. Shaky gait, able to stand and pivot with 1 person stand-by assist.

## 2015-12-22 ENCOUNTER — Other Ambulatory Visit: Payer: Self-pay | Admitting: Nurse Practitioner

## 2015-12-23 ENCOUNTER — Encounter: Payer: Self-pay | Admitting: Internal Medicine

## 2015-12-23 ENCOUNTER — Non-Acute Institutional Stay: Payer: Medicare Other | Admitting: Internal Medicine

## 2015-12-23 VITALS — BP 118/64 | HR 56 | Temp 97.6°F | Ht 74.0 in | Wt 152.0 lb

## 2015-12-23 DIAGNOSIS — I10 Essential (primary) hypertension: Secondary | ICD-10-CM | POA: Diagnosis not present

## 2015-12-23 DIAGNOSIS — R339 Retention of urine, unspecified: Secondary | ICD-10-CM | POA: Diagnosis not present

## 2015-12-23 DIAGNOSIS — N401 Enlarged prostate with lower urinary tract symptoms: Secondary | ICD-10-CM | POA: Diagnosis not present

## 2015-12-23 DIAGNOSIS — R609 Edema, unspecified: Secondary | ICD-10-CM | POA: Insufficient documentation

## 2015-12-23 DIAGNOSIS — R262 Difficulty in walking, not elsewhere classified: Secondary | ICD-10-CM | POA: Diagnosis not present

## 2015-12-23 DIAGNOSIS — N3090 Cystitis, unspecified without hematuria: Secondary | ICD-10-CM | POA: Diagnosis not present

## 2015-12-23 DIAGNOSIS — N138 Other obstructive and reflux uropathy: Secondary | ICD-10-CM | POA: Diagnosis not present

## 2015-12-23 NOTE — Progress Notes (Signed)
Facility      Place of Service: Clinic (12)     Allergies  Allergen Reactions  . Latex Rash  . Tape Rash    Paper tape is ok to use     Chief Complaint  Patient presents with  . Medical Management of Chronic Issues    12/20/15 went to ED for dysuria for one week with hematuria. Dx: Acute cystitis with hematuria, gave hime Keflex 555m tid, Apresoline 240mqid. Here with wife    HPI:  Dysuria has resolved since on Keflex. He still does self catheterization for bladder obstruction related to enlarged porostate.  BO well controlled on current meds.   Unstable gait. Using 4 wheel walker. At risk for additional falls. Had fall evaluation in PT.  Saw Dr. GiGladstone Lighterbout a possible stress fracture of the right leg. Had Xrays and also an Ultrsound of the leg. He says he does not have a fracture or a DVT.  Medications: Patient's Medications  New Prescriptions   No medications on file  Previous Medications   ASPIRIN 81 MG TABLET    Take 81 mg by mouth every morning.    ATORVASTATIN (LIPITOR) 20 MG TABLET    TAKE ONE TABLET BY MOUTH ONCE DAILY FOR CHOLESTEROL   B COMPLEX-C (B-COMPLEX WITH VITAMIN C) TABLET    Take 1 tablet by mouth every morning.   BIMATOPROST (LUMIGAN) 0.01 % SOLN    Place 1 drop into both eyes at bedtime.   BRIMONIDINE (ALPHAGAN) 0.15 % OPHTHALMIC SOLUTION       BUPROPION (WELLBUTRIN XL) 300 MG 24 HR TABLET    Take 300 mg by mouth every morning.    CEPHALEXIN (KEFLEX) 500 MG CAPSULE    Take 1 capsule (500 mg total) by mouth 3 (three) times daily.   CHOLECALCIFEROL (VITAMIN D) 1000 UNITS TABLET    Take 3,000 Units by mouth.    CLONAZEPAM (KLONOPIN) 0.5 MG TABLET    Take one daily for anxiety   FLAXSEED, LINSEED, (FLAX SEEDS PO)    Take 1 tablet by mouth every morning.    FLUTICASONE (CUTIVATE) 0.05 % CREAM    Apply twice daily to affected area   HYDRALAZINE (APRESOLINE) 25 MG TABLET    Take 1 tablet (25 mg total) by mouth 4 (four) times daily. Follow up with  your regular doctor to discuss titration of medication   KETOCONAZOLE (NIZORAL) 2 % CREAM       KRILL OIL 500 MG CAPS    Take 500 mg by mouth every morning.    L-METHYLFOLATE 7.5 MG TABS    Take 7.5 mg by mouth daily.   LEVOTHYROXINE (SYNTHROID) 50 MCG TABLET    Take 1/2 tablet by mouth on Sunday mornings and Take one tablet by mouth every other morning for thryoid.   LINZESS 290 MCG CAPS CAPSULE    TAKE ONE CAPSULE BY MOUTH DAILY   MIRTAZAPINE (REMERON) 45 MG TABLET    Take 45 mg by mouth at bedtime.   MULTIPLE VITAMINS-MINERALS (ICAPS AREDS FORMULA PO)    Take 1 capsule by mouth daily.    POLYETHYLENE GLYCOL (MIRALAX / GLYCOLAX) PACKET    Take 17 g by mouth daily as needed for mild constipation.   SERTRALINE (ZOLOFT) 100 MG TABLET    Take two tablets by mouth once daily in the morning   SYNTHROID 50 MCG TABLET    TAKE 1/2 TABLET BY MOUTH ON SUNDAY MORNINGS AND ONE TABLET BY MOUTH EVERY OTHER  MORNING FOR THYROID. *NEEDS APPT   TAMSULOSIN (FLOMAX) 0.4 MG CAPS CAPSULE    TAKE 1 CAPSULE (0.4 MG TOTAL) BY MOUTH DAILY.   TESTOSTERONE ([name]) 50 MG/5GM GEL    Place 5 g onto the skin every morning.    TOPIRAMATE (TOPAMAX) 25 MG TABLET    TAKE 1 TABLET BY MOUTH EVERY MORNING AND 2 TABLETS IN THE EVENING  Modified Medications   No medications on file  Discontinued Medications   No medications on file     Review of Systems  Constitutional: Positive for activity change and fatigue. Negative for appetite change, chills, diaphoresis, fever and unexpected weight change.       Complains of generalized weakness  HENT: Positive for hearing loss. Negative for sore throat, trouble swallowing and voice change.   Eyes: Positive for visual disturbance (corrective lenses).       History of wet macular degeneration  Respiratory: Positive for shortness of breath (On exertion).   Cardiovascular: Positive for leg swelling. Negative for chest pain and palpitations.  Gastrointestinal: Positive for constipation.  Negative for abdominal distention, abdominal pain, nausea and vomiting.  Endocrine:       Hypothyroid. Testosterone deficiency.  Genitourinary: Positive for difficulty urinating, enuresis, frequency and urgency. Negative for discharge, dysuria, flank pain and hematuria.  Musculoskeletal: Positive for arthralgias, back pain and gait problem.       Unstable gait and hx falls. Knee pains, particularly in the right knee. Cervical spinal stenosis with off and on pains in the neck.. Right lower leg edematous at 2+.  Skin: Negative.   Neurological: Positive for tremors, weakness and headaches. Negative for dizziness, seizures, syncope, facial asymmetry, light-headedness and numbness.       Recent altered mentation when ill. Mild dementia. History of cerebral atrophy and cerebrovascular disease. History of cervical spondylosis with spinal cord stenosis.  Hematological: Negative.   Psychiatric/Behavioral: Negative for agitation. The patient is nervous/anxious.        Hx depression    Vitals:   12/23/15 0857  BP: 118/64  Pulse: (!) 56  Temp: 97.6 F (36.4 C)  TempSrc: Oral  SpO2: 99%  Weight: 152 lb (68.9 kg)  Height: _0  (1.88 m)   Wt Readings from Last 3 Encounters:  12/23/15 152 lb (68.9 kg)  12/20/15 155 lb (70.3 kg)  10/28/15 150 lb (68 kg)    Body mass index is 19.52 kg/m.  Physical Exam  Constitutional: He is oriented to person, place, and time.  Thin, frail  HENT:  Head: Normocephalic and atraumatic.  Hearing loss  Eyes: Conjunctivae and EOM are normal. Pupils are equal, round, and reactive to light.  Neck: No JVD present. No tracheal deviation present. No thyromegaly present.  Cardiovascular: Normal rate, regular rhythm and normal heart sounds.  Exam reveals no gallop and no friction rub.   No murmur heard. Pulmonary/Chest: No respiratory distress. He has no wheezes. He has no rales. He exhibits no tenderness.  Abdominal: He exhibits no distension and no mass. There  is no tenderness.  Musculoskeletal: Normal range of motion. He exhibits edema (right calf 2+). He exhibits no tenderness.  Lurching, unstable gait. Using four-wheel walker. Right knee pain.  Lymphadenopathy:    He has no cervical adenopathy.  Neurological: He is alert and oriented to person, place, and time. No cranial nerve deficit. Coordination abnormal.  Tremor bilaterally. Unstable gait.  Skin: No rash noted. No erythema. No pallor.  Each callus ball of right foot. Hemorrhage under the callus.  Psychiatric: He has a normal mood and affect. His behavior is normal. Thought content normal.     Labs reviewed: Lab Summary Latest Ref Rng & Units 12/20/2015 12/02/2014 11/18/2014  Hemoglobin 13.0 - 17.0 g/dL 14.1 14.3 (None)  Hematocrit 39.0 - 52.0 % 44.1 44.1 (None)  White count 4.0 - 10.5 K/uL 6.7 5.6 (None)  Platelet count 150 - 400 K/uL 127(L) 116(L) (None)  Sodium 135 - 145 mmol/L 140 140 142  Potassium 3.5 - 5.1 mmol/L 4.2 4.6 4.6  Calcium 8.9 - 10.3 mg/dL 8.9 8.8(L) (None)  Phosphorus - (None) (None) (None)  Creatinine 0.61 - 1.24 mg/dL 1.14 1.27(H) 1.2  AST 15 - 41 U/L 19 (None) (None)  Alk Phos 38 - 126 U/L 41 (None) (None)  Bilirubin 0.3 - 1.2 mg/dL 0.5 (None) (None)  Glucose 65 - 99 mg/dL 89 85 78  Cholesterol 0 - 200 mg/dL (None) (None) 189  HDL cholesterol 35 - 70 mg/dL (None) (None) 53  Triglycerides 40 - 160 mg/dL (None) (None) 94  LDL Direct - (None) (None) (None)  LDL Calc mg/dL (None) (None) 117  Total protein 6.5 - 8.1 g/dL 6.5 (None) (None)  Albumin 3.5 - 5.0 g/dL 3.6 (None) (None)  Some recent data might be hidden   Lab Results  Component Value Date   TSH 3.96 11/18/2014   Lab Results  Component Value Date   BUN 33 (H) 12/20/2015   BUN 35 (H) 12/02/2014   BUN 31 (A) 11/18/2014   Lab Results  Component Value Date   CREATININE 1.14 12/20/2015   CREATININE 1.27 (H) 12/02/2014   CREATININE 1.2 11/18/2014   Lab Results  Component Value Date   HGBA1C  5.3 07/31/2012   HGBA1C 5.4 03/16/2011   HGBA1C 5.4 02/26/2008       Assessment/Plan  1. Benign prostatic hyperplasia with urinary obstruction Continue with self cath  2. Cystitis Symptoms resolved  3. Edema, unspecified type Rec: compression hosieery  4. Bladder retention Self cath  5. Essential hypertension controlled  6. Difficulty walking Continue use of walker

## 2015-12-24 LAB — URINE CULTURE

## 2015-12-25 NOTE — Progress Notes (Signed)
ED Antimicrobial Stewardship Positive Culture Follow Up   Jose Leach is an 82 y.o. male who presented to [facility] on 12/20/2015 with a chief complaint of  Chief Complaint  Patient presents with  . Hematuria    Recent Results (from the past 720 hour(s))  Urine culture     Status: Abnormal   Collection Time: 12/20/15  2:15 PM  Result Value Ref Range Status   Specimen Description URINE, RANDOM  Final   Special Requests NONE  Final   Culture (A)  Final    >=100,000 COLONIES/mL PROTEUS MIRABILIS Susceptibility Pattern Suggests Possibility of an Extended Spectrum Beta Lactamase Producer. Contact Laboratory Within 7 Days if Confirmation Warranted. Performed at [facility]    Report Status 12/24/2015 FINAL  Final   Organism ID, Bacteria PROTEUS MIRABILIS (A)  Final      Susceptibility   Proteus mirabilis - MIC*    AMPICILLIN >=32 RESISTANT Resistant     CEFAZOLIN <=4 RESISTANT Resistant     CEFTRIAXONE <=1 RESISTANT Resistant     CIPROFLOXACIN <=0.25 SENSITIVE Sensitive     GENTAMICIN <=1 SENSITIVE Sensitive     IMIPENEM 2 SENSITIVE Sensitive     NITROFURANTOIN 64 RESISTANT Resistant     TRIMETH/SULFA <=20 SENSITIVE Sensitive     AMPICILLIN/SULBACTAM 8 SENSITIVE Sensitive     PIP/TAZO <=4 SENSITIVE Sensitive     * >=100,000 COLONIES/mL PROTEUS MIRABILIS    [x]  Treated with Kelfex, organism resistant to prescribed antimicrobial  Pt saw PCP on 11/7. At that time, pt reported that dysuria has resolved since starting keflex. Culture susceptibilities retested with the above results. Called patient to assess symptoms. Per patient, no dysuria, no urinary frequency. Pt reports he is "doing fine". Given reported improvement, no changes made to antibiotics. Asked patient to follow up with PCP should symptoms return. Patient verbalized understanding and denies any questions.    ED Provider: Lenn Sink, PA   Carlean Jews, Pharm.D. PGY1 Pharmacy  Resident 11/9/201710:11 AM Pager [phone] Phone# [phone]

## 2016-01-01 ENCOUNTER — Ambulatory Visit (INDEPENDENT_AMBULATORY_CARE_PROVIDER_SITE_OTHER): Payer: Medicare Other | Admitting: Nurse Practitioner

## 2016-01-01 ENCOUNTER — Encounter: Payer: Self-pay | Admitting: Nurse Practitioner

## 2016-01-01 VITALS — BP 142/78 | HR 56 | Ht 74.0 in | Wt 155.2 lb

## 2016-01-01 DIAGNOSIS — M4802 Spinal stenosis, cervical region: Secondary | ICD-10-CM

## 2016-01-01 DIAGNOSIS — G250 Essential tremor: Secondary | ICD-10-CM | POA: Diagnosis not present

## 2016-01-01 MED ORDER — TOPIRAMATE 25 MG PO TABS: ORAL_TABLET | ORAL | 11 refills | Status: DC

## 2016-01-01 NOTE — Progress Notes (Signed)
[facility]  PATIENT: Jose Leach DOB: Feb 04, 1933   REASON FOR VISIT: Follow-up for essential tremor, gait abnormality, spinal stenosis HISTORY FROM: patient    HISTORY OF PRESENT ILLNESS:Jose Leach    82 year old right-handed white male with a history of an essential tremor returns for yearly follow-up. The patient has an associated gait disturbance. The patient was found to have evidence of spinal stenosis with cord impingement on a study done in 2013. The patient was referred to neurosurgery, but surgery was not recommended. The patient has continued to have a gait disturbance, and he will fall on occasion. The last fall was in September no apparent injury.  The patient uses a walker for ambulation. The patient has  macular degeneration. He likes to paint landscapes however his vision as well as his tremor affects his ability to do this. He denies any difficulty with being able to feed himself or perform his activities of daily living. He has weighted utensils . The patient will occasionally have flexion jerking of the left leg, but he is able to control this by using a brace on the left foot. The patient is on Topamax for the tremor and needs refills. He returns for reevaluation    REVIEW OF SYSTEMS: Full 14 system review of systems performed and notable only for those listed, all others are neg:  Constitutional: neg  Cardiovascular: neg Ear/Nose/Throat: neg  Skin: neg Eyes:   blurred vision, macular degeneration ,  Gastroitestinal: neg Hematology/Lymphatic: neg  Endocrine: neg Musculoskeletal:neck Leach, back Leach Immunology: neg Neurological Tremors  Psychiatric  Anxiety depression Sleep : neg   ALLERGIES: Allergies  Allergen Reactions  . Latex Rash  . Tape Rash    Paper tape is ok to use     HOME MEDICATIONS: Outpatient Medications Prior to Visit  Medication Sig Dispense Refill  . aspirin 81 MG tablet Take 81 mg by mouth every morning.      Marland Kitchen atorvastatin (LIPITOR) 20 MG tablet TAKE ONE TABLET BY MOUTH ONCE DAILY FOR CHOLESTEROL 30 tablet 3  . bimatoprost (LUMIGAN) 0.01 % SOLN Place 1 drop into both eyes at bedtime.    Marland Kitchen buPROPion (WELLBUTRIN XL) 300 MG 24 hr tablet Take 300 mg by mouth every morning.     . clonazePAM (KLONOPIN) 0.5 MG tablet Take one daily for anxiety    . fluticasone (CUTIVATE) 0.05 % cream Apply twice daily to affected area    . hydrALAZINE (APRESOLINE) 25 MG tablet Take 1 tablet (25 mg total) by mouth 4 (four) times daily. Follow up with your regular doctor to discuss titration of medication 120 tablet 0  . ketoconazole (NIZORAL) 2 % cream     . Krill Oil 500 MG CAPS Take 500 mg by mouth every morning.     Marland Kitchen levothyroxine (SYNTHROID) 50 MCG tablet Take 1/2 tablet by mouth on Sunday mornings and Take one tablet by mouth every other morning for thryoid. 30 tablet 0  . LINZESS 290 MCG CAPS capsule TAKE ONE CAPSULE BY MOUTH DAILY 30 capsule 0  . mirtazapine (REMERON) 45 MG tablet Take 45 mg by mouth at bedtime.    . Multiple Vitamins-Minerals (ICAPS AREDS FORMULA PO) Take 1 capsule by mouth daily.     . polyethylene glycol (MIRALAX / GLYCOLAX) packet Take 17 g by mouth daily as needed for mild constipation.    . sertraline (ZOLOFT) 100 MG tablet Take two tablets by mouth once daily in the morning    . SYNTHROID 50  MCG tablet TAKE 1/2 TABLET BY MOUTH ON SUNDAY MORNINGS AND ONE TABLET BY MOUTH EVERY OTHER MORNING FOR THYROID. *NEEDS APPT 30 tablet 0  . tamsulosin (FLOMAX) 0.4 MG CAPS capsule TAKE 1 CAPSULE (0.4 MG TOTAL) BY MOUTH DAILY.    Marland Kitchen testosterone ([name]) 50 MG/5GM GEL Place 5 g onto the skin every morning.     . topiramate (TOPAMAX) 25 MG tablet TAKE ONE TABLET BY MOUTH EVERY MORNING AND 2 TABLETS IN THE EVENING 90 tablet 0  . B Complex-C (B-COMPLEX WITH VITAMIN C) tablet Take 1 tablet by mouth every morning.    . brimonidine (ALPHAGAN) 0.15 % ophthalmic solution     . cholecalciferol (VITAMIN D) 1000  UNITS tablet Take 3,000 Units by mouth.     . Flaxseed, Linseed, (FLAX SEEDS PO) Take 1 tablet by mouth every morning.     Marland Kitchen L-Methylfolate 7.5 MG TABS Take 7.5 mg by mouth daily.     No facility-administered medications prior to visit.     PAST MEDICAL HISTORY: Past Medical History:  Diagnosis Date  . Acute urinary retention 01/28/2013  . AKI (acute kidney injury) ([place]) 01/26/2013  . Altered mental status 09/09/2013  . Anemia    B12 deficiency  . ANXIETY 06/07/2007   Qualifier: Diagnosis of  By: Talbert Cage CMA ([place]), June    . ARTHRITIS 06/07/2007   Qualifier: Diagnosis of  By: Talbert Cage CMA ([place]), June    . Balance problems 04/05/2011  . Benign essential tremor   . Benign fibroma of prostate 10/11/2011  . Bilateral inguinal hernia 10/09/2012  . Bilateral leg edema 09/13/2012  . Bladder retention 02/05/2013  . Bradycardia, sinus 08/02/2012  . CAROTID ARTERY DISEASE 03/31/2007   Qualifier: Diagnosis of  By: Linna Darner MD, Gwyndolyn Saxon    . Carotid stenosis    s/p L CEA  . Cataract, nuclear 03/21/2014  . 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 Cervical spinal stenosis   . Cervical stenosis of spine   . Critical lower limb ischemia   . Degeneration macular 11/02/2011  . Depression    Dr Albertine Patricia  . Difficulty in walking(719.7) 04/05/2011  . ESOPHAGITIS 08/06/2002   Qualifier: Diagnosis of  By: Talbert Cage CMA ([place]), June    . Essential and other specified forms of tremor 04/18/2013  . Falls   . Family history of colon cancer 07/24/2012  . Fecal impaction ([place]) 10/11/2012  . Femoral hernia, bilateral s/p lap repair 01/16/2013 01/16/2013  . Glaucoma, compensated 03/21/2014  . H/O cardiovascular stress test    a. 02/2003 -> no ischemia/infarct.  Marland Kitchen HAMMER TOE 04/23/2010   Qualifier: Diagnosis of  By: Linna Darner MD, Gwyndolyn Saxon    . Hematoma of leg   . Hip hematoma, right 09/09/2013  . History of shingles 2009  . HTN (hypertension) 08/22/2012  . Hx of echocardiogram    Echocardiogram 08/01/12: Mild LVH, EF 55-60%, normal  wall motion.  . Hydrocele 10/11/2011  . Hyperlipidemia   . 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 Hypothyroidism   . Macular degeneration disease   . Macular edema   . Migraines    Dr Jannifer Franklin  . MORTON'S NEUROMA, LEFT 08/22/2007   Qualifier: Diagnosis of  By: Linna Darner MD, Gwyndolyn Saxon    . PAD (peripheral artery disease) ([place])   . Peripheral neuropathy ([place])   . Personal history of colonic polyps 01/05/2013   2014 2 mm adenomatous polyp   . PREMATURE ATRIAL CONTRACTIONS 03/14/2006   Qualifier: Diagnosis of  By: Linna Darner MD, Gwendolyn Lima 12/16/2011  .  SPINAL STENOSIS 03/14/2006   Qualifier: Diagnosis of  By: Linna Darner MD, William   Cervical spine    . Testosterone deficiency    Dr Lawerance Bach[facility]  . Unspecified vitamin D deficiency 08/06/2012  . Urge incontinence 10/09/2012  . Urinary tract infection, site not specified 02/13/2013  . Weakness 08/02/2012    PAST SURGICAL HISTORY: Past Surgical History:  Procedure Laterality Date  . APPENDECTOMY  1941  . BREAST CYST EXCISION Left 01/16/2013   Procedure: MASS EXCISION AXILLA;  Surgeon: Adin Hector, MD;  Location: [place];  Service: General;  Laterality: Left;  . CAROTID ENDARTERECTOMY  2005    L  . CATARACT EXTRACTION Right   . COLONOSCOPY  2014   Dr. Deatra Ina  . EYE SURGERY Right 02/2012   retina  . INGUINAL HERNIA REPAIR Bilateral 01/16/2013   Procedure: LAPAROSCOPIC BILATERAL INGUINAL DIRECT AND INDIRECT, FEMORAL, AND OBTURATOR HERNIAS;  Surgeon: Adin Hector, MD;  Location: [place];  Service: General;  Laterality: Bilateral;  . INSERTION OF MESH N/A 01/16/2013   Procedure: INSERTION OF MESH;  Surgeon: Adin Hector, MD;  Location: [place];  Service: General;  Laterality: N/A;  . LUMBAR LAMINECTOMY    . TONSILLECTOMY AND ADENOIDECTOMY    . TOTAL HIP ARTHROPLASTY  2005   L    FAMILY HISTORY: Family History  Problem Relation Age of Onset  . Stroke Mother 28  . Hypertension Mother   . Diabetes Mother   . Heart disease Mother    . Rectal cancer Father 32  . Heart disease Father     in 77s  . Cancer Father     colorectal  . Nephritis Sister   . Seizures Brother     died as child    SOCIAL HISTORY: Social History   Social History  . Marital status: Married    Spouse name: Jose Leach  . Number of children: 2  . Years of education: N/A   Occupational History  . retired  Retired   Social History Main Topics  . Smoking status: Former Smoker    Years: 10.00    Types: Pipe, Cigars    Quit date: 02/16/1979  . Smokeless tobacco: Never Used     Comment: Quit about age 77   . Alcohol use 2.4 oz/week    4 Standard drinks or equivalent per week     Comment:  occasional  . Drug use: No  . Sexual activity: No   Other Topics Concern  . Not on file   Social History Narrative   Lives at [facility] since 2013   Married - Jose Leach   Former smoker -stopped 1981   Alcohol -wine occasionally    Exercise - walking, Ne-step    POA, Living Will   [employer] with walker     PHYSICAL EXAM  Vitals:   01/01/16 1242  BP: (!) 142/78  Pulse: (!) 56  Weight: 155 lb 3.2 oz (70.4 kg)  Height: 6\' 2"  (1.88 m)   Body mass index is 19.93 kg/m. General: The patient is alert and cooperative at the time of the examination.Well-groomed Skin: no  edema of the ankles   Neurologic Exam Mental status: The patient is oriented x 3. Cranial nerves: Facial symmetry is present. Speech is normal, no aphasia or dysarthria is noted. Extraocular movements are full. Visual fields are full. A prominent vocal tremor is noted.  Motor: The patient has good strength in all 4 extremities.No focal weakness Sensory examination: Soft touch  sensation is symmetric on the face, arms, and legs.  Coordination: The patient has good finger-nose-finger and heel-to-shin bilaterally. The patient has an intention tremor with finger-nose-finger bilaterally. Gait and station: The patient has a slightly wide-based, unsteady gait. The patient uses a walker  for ambulation. Tandem gait was not attempted. Romberg is negative. No drift is seen. Reflexes: Deep tendon reflexes are symmetric.   DIAGNOSTIC DATA (LABS, IMAGING, TESTING) - I reviewed patient records, labs, notes, testing and imaging myself where available.      Component Value Date/Time   NA 140 12/20/2015 1740   NA 142 11/18/2014   K 4.2 12/20/2015 1740   CL 111 12/20/2015 1740   CO2 22 12/20/2015 1740   GLUCOSE 89 12/20/2015 1740   BUN 33 (H) 12/20/2015 1740   BUN 31 (A) 11/18/2014   CREATININE 1.14 12/20/2015 1740   CALCIUM 8.9 12/20/2015 1740   PROT 6.5 12/20/2015 1740   ALBUMIN 3.6 12/20/2015 1740   AST 19 12/20/2015 1740   ALT 17 12/20/2015 1740   ALKPHOS 41 12/20/2015 1740   BILITOT 0.5 12/20/2015 1740   GFRNONAA 58 (L) 12/20/2015 1740   GFRAA >60 12/20/2015 1740   Lab Results  Component Value Date   CHOL 189 11/18/2014   HDL 53 11/18/2014   LDLCALC 117 11/18/2014   LDLDIRECT 132.5 09/08/2012   TRIG 94 11/18/2014   CHOLHDL 4 09/08/2012   ASSESSMENT AND PLAN  82 y.o. year old male  has a past medical history of  1.benign essential tremor  2. Cervical spondylosis spinal stenosis   3 Gait instability   Continue Topamax at current dose will refill for one year Use walker at all times for safe ambulation risk of falls Follow-up yearly and when necessary, next with Jose Leach, [facility], [facility], APRN  [facility] [address], [place], [place] [address] [phone]

## 2016-01-01 NOTE — Progress Notes (Signed)
I have read the note, and I agree with the clinical assessment and plan.  WILLIS,CHARLES KEITH   

## 2016-01-01 NOTE — Patient Instructions (Signed)
Continue Topamax at current dose will refill for one year Use walker at all times for safe ambulation risk of falls Follow-up yearly and when necessary

## 2016-01-05 ENCOUNTER — Other Ambulatory Visit: Payer: Self-pay | Admitting: Internal Medicine

## 2016-01-05 NOTE — Telephone Encounter (Signed)
request from Kristopher Oppenheim

## 2016-01-29 ENCOUNTER — Other Ambulatory Visit: Payer: Self-pay

## 2016-01-29 DIAGNOSIS — E039 Hypothyroidism, unspecified: Secondary | ICD-10-CM

## 2016-01-29 DIAGNOSIS — E7849 Other hyperlipidemia: Secondary | ICD-10-CM

## 2016-01-29 DIAGNOSIS — I10 Essential (primary) hypertension: Secondary | ICD-10-CM

## 2016-02-02 LAB — COMPLETE METABOLIC PANEL WITH GFR
ALT: 15 U/L (ref 9–46)
AST: 18 U/L (ref 10–35)
Albumin: 4 g/dL (ref 3.6–5.1)
Alkaline Phosphatase: 42 U/L (ref 40–115)
BILIRUBIN TOTAL: 0.5 mg/dL (ref 0.2–1.2)
BUN: 37 mg/dL — ABNORMAL HIGH (ref 7–25)
CALCIUM: 9.4 mg/dL (ref 8.6–10.3)
CO2: 23 mmol/L (ref 20–31)
CREATININE: 1.24 mg/dL — AB (ref 0.70–1.11)
Chloride: 108 mmol/L (ref 98–110)
GFR, EST AFRICAN AMERICAN: 63 mL/min (ref >=60)
GFR, EST NON AFRICAN AMERICAN: 54 mL/min — AB (ref >=60)
Glucose, Bld: 84 mg/dL (ref 65–99)
Potassium: 4.4 mmol/L (ref 3.5–5.3)
Sodium: 141 mmol/L (ref 135–146)
TOTAL PROTEIN: 7.1 g/dL (ref 6.1–8.1)

## 2016-02-02 LAB — LIPID PANEL
CHOLESTEROL: 160 mg/dL (ref <200)
HDL: 49 mg/dL (ref >40)
LDL CALC: 95 mg/dL (ref <100)
TRIGLYCERIDES: 78 mg/dL (ref <150)
Total CHOL/HDL Ratio: 3.3 Ratio (ref <5.0)
VLDL: 16 mg/dL (ref <30)

## 2016-02-02 LAB — TSH: TSH: 4.52 mIU/L — ABNORMAL HIGH (ref 0.40–4.50)

## 2016-02-03 ENCOUNTER — Encounter: Payer: Self-pay | Admitting: Nurse Practitioner

## 2016-02-03 ENCOUNTER — Ambulatory Visit (INDEPENDENT_AMBULATORY_CARE_PROVIDER_SITE_OTHER): Payer: Medicare Other | Admitting: Nurse Practitioner

## 2016-02-03 VITALS — BP 132/78 | HR 62 | Temp 97.9°F | Resp 18 | Ht 74.0 in | Wt 148.6 lb

## 2016-02-03 DIAGNOSIS — J069 Acute upper respiratory infection, unspecified: Secondary | ICD-10-CM | POA: Diagnosis not present

## 2016-02-03 MED ORDER — DOXYCYCLINE HYCLATE 100 MG PO TABS: 100.0000 mg | ORAL_TABLET | Freq: Two times a day (BID) | ORAL | 0 refills | Status: DC

## 2016-02-03 NOTE — Patient Instructions (Addendum)
Mucinex DM 1 tablet twice a day with a full glass of water.   Saline nasal spray as needed throughout the day for nasal congestion  Tylenol 650 mg every 6 hours as needed for pain or fever - do not take more than 9 tablets in 24 hours.      Upper Respiratory Infection, Adult Most upper respiratory infections (URIs) are caused by a virus. A URI affects the nose, throat, and upper air passages. The most common type of URI is often called "the common cold." Follow these instructions at home:  Take medicines only as told by your doctor.  Gargle warm saltwater or take cough drops to comfort your throat as told by your doctor.  Use a warm mist humidifier or inhale steam from a shower to increase air moisture. This may make it easier to breathe.  Drink enough fluid to keep your pee (urine) clear or pale yellow.  Eat soups and other clear broths.  Have a healthy diet.  Rest as needed.  Go back to work when your fever is gone or your doctor says it is okay.  You may need to stay home longer to avoid giving your URI to others.  You can also wear a face mask and wash your hands often to prevent spread of the virus.  Use your inhaler more if you have asthma.  Do not use any tobacco products, including cigarettes, chewing tobacco, or electronic cigarettes. If you need help quitting, ask your doctor. Contact a doctor if:  You are getting worse, not better.  Your symptoms are not helped by medicine.  You have chills.  You are getting more short of breath.  You have brown or red mucus.  You have yellow or brown discharge from your nose.  You have pain in your face, especially when you bend forward.  You have a fever.  You have puffy (swollen) neck glands.  You have pain while swallowing.  You have white areas in the back of your throat. Get help right away if:  You have very bad or constant:  Headache.  Ear pain.  Pain in your forehead, behind your eyes, and over  your cheekbones (sinus pain).  Chest pain.  You have long-lasting (chronic) lung disease and any of the following:  Wheezing.  Long-lasting cough.  Coughing up blood.  A change in your usual mucus.  You have a stiff neck.  You have changes in your:  Vision.  Hearing.  Thinking.  Mood. This information is not intended to replace advice given to you by your health care provider. Make sure you discuss any questions you have with your health care provider. Document Released: 07/21/2007 Document Revised: 10/05/2015 Document Reviewed: 05/09/2013 Elsevier Interactive Patient Education  2017 [employer].

## 2016-02-03 NOTE — Progress Notes (Signed)
Careteam: Patient Care Team: Estill Dooms, MD as PCP - General (Internal Medicine) [name], MD as Consulting Physician (Urology) [name], MD as Consulting Physician (Neurology) [name], MD as Consulting Physician (Dermatology) [name], MD as Consulting Physician (Gastroenterology) [name], MD as Consulting Physician (Orthopedic Surgery) [name], MD as Consulting Physician (Cardiology) [name], MD as Consulting Physician (Ophthalmology) [name], MD as Referring Physician (Psychiatry) [name], MD as Consulting Physician (General Surgery) [name], MD as Consulting Physician (Orthopedic Surgery)  Advanced Directive information Does Patient Have a [place] Directive?: Yes, Type of Advance Directive: Healthcare Power of [place];Living will  Allergies  Allergen Reactions  . Latex Rash  . Tape Rash    Paper tape is ok to use     Chief Complaint  Patient presents with  . Acute Visit    head/chest congestion x 2-3 days; cough with brown mucus     HPI: Patient is a 82 y.o. male seen in the office today with c/o a productive cough with brown sputum that started 3 days ago. Additional symptoms include sneezing, rhinorrhea, congestion in the head and chest (worse in the head), headache, sore throat, and postnasal drip.  Denies sinus pain or pressure, fever, or chills. Overall feels bad. Took a decongestant this morning which he reports was effective, but he does not remember what it was.   Review of Systems:  Review of Systems  Constitutional: Negative for chills and fever.  HENT: Positive for congestion, postnasal drip, rhinorrhea, sneezing and sore throat. Negative for ear pain, sinus pain and sinus pressure.   Respiratory: Positive for cough, shortness of breath and wheezing. Negative for chest tightness.   Cardiovascular: Negative for chest pain, palpitations and leg swelling.  Gastrointestinal:  Negative for constipation, diarrhea, nausea and vomiting.  Neurological: Positive for headaches. Negative for dizziness and light-headedness.   Past Medical History:  Diagnosis Date  . Acute urinary retention 01/28/2013  . AKI (acute kidney injury) ([place]) 01/26/2013  . Altered mental status 09/09/2013  . Anemia    B12 deficiency  . ANXIETY 06/07/2007   Qualifier: Diagnosis of  By: Talbert Cage CMA ([place]), June    . ARTHRITIS 06/07/2007   Qualifier: Diagnosis of  By: Talbert Cage CMA ([place]), June    . Balance problems 04/05/2011  . Benign essential tremor   . Benign fibroma of prostate 10/11/2011  . Bilateral inguinal hernia 10/09/2012  . Bilateral leg edema 09/13/2012  . Bladder retention 02/05/2013  . Bradycardia, sinus 08/02/2012  . CAROTID ARTERY DISEASE 03/31/2007   Qualifier: Diagnosis of  By: Linna Darner MD, Gwyndolyn Saxon    . Carotid stenosis    s/p L CEA  . Cataract, nuclear 03/21/2014  . 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 Cervical spinal stenosis   . Cervical stenosis of spine   . Critical lower limb ischemia   . Degeneration macular 11/02/2011  . Depression    Dr Albertine Patricia  . Difficulty in walking(719.7) 04/05/2011  . ESOPHAGITIS 08/06/2002   Qualifier: Diagnosis of  By: Talbert Cage CMA ([place]), June    . Essential and other specified forms of tremor 04/18/2013  . Falls   . Family history of colon cancer 07/24/2012  . Fecal impaction ([place]) 10/11/2012  . Femoral hernia, bilateral s/p lap repair 01/16/2013 01/16/2013  . Glaucoma, compensated 03/21/2014  . H/O cardiovascular stress test    a. 02/2003 -> no ischemia/infarct.  Marland Kitchen HAMMER TOE 04/23/2010   Qualifier: Diagnosis of  By: Linna Darner  MD, Gwyndolyn Saxon    . Hematoma of leg   . Hip hematoma, right 09/09/2013  . History of shingles 2009  . HTN (hypertension) 08/22/2012  . Hx of echocardiogram    Echocardiogram 08/01/12: Mild LVH, EF 55-60%, normal wall motion.  . Hydrocele 10/11/2011  . Hyperlipidemia   . 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 Hypothyroidism   .  Macular degeneration disease   . Macular edema   . Migraines    Dr Jannifer [place]  . MORTON'S NEUROMA, LEFT 08/22/2007   Qualifier: Diagnosis of  By: Linna Darner MD, Gwyndolyn Saxon    . PAD (peripheral artery disease) ([place])   . Peripheral neuropathy ([place])   . Personal history of colonic polyps 01/05/2013   2014 2 mm adenomatous polyp   . PREMATURE ATRIAL CONTRACTIONS 03/14/2006   Qualifier: Diagnosis of  By: Linna Darner MD, Gwendolyn Lima 12/16/2011  . SPINAL STENOSIS 03/14/2006   Qualifier: Diagnosis of  By: Linna Darner MD, William   Cervical spine    . Testosterone deficiency    Dr Lawerance Bach, [facility]  . Unspecified vitamin D deficiency 08/06/2012  . Urge incontinence 10/09/2012  . Urinary tract infection, site not specified 02/13/2013  . Weakness 08/02/2012   Past Surgical History:  Procedure Laterality Date  . APPENDECTOMY  1941  . BREAST CYST EXCISION Left 01/16/2013   Procedure: MASS EXCISION AXILLA;  Surgeon: Adin Hector, MD;  Location: [facility];  Service: General;  Laterality: Left;  . CAROTID ENDARTERECTOMY  2005    L  . CATARACT EXTRACTION Right   . COLONOSCOPY  2014   Dr. Deatra Ina  . EYE SURGERY Right 02/2012   retina  . INGUINAL HERNIA REPAIR Bilateral 01/16/2013   Procedure: LAPAROSCOPIC BILATERAL INGUINAL DIRECT AND INDIRECT, FEMORAL, AND OBTURATOR HERNIAS;  Surgeon: Adin Hector, MD;  Location: [facility];  Service: General;  Laterality: Bilateral;  . INSERTION OF MESH N/A 01/16/2013   Procedure: INSERTION OF MESH;  Surgeon: Adin Hector, MD;  Location: [facility];  Service: General;  Laterality: N/A;  . LUMBAR LAMINECTOMY    . TONSILLECTOMY AND ADENOIDECTOMY    . TOTAL HIP ARTHROPLASTY  2005   L   Social History:   reports that he quit smoking about 36 years ago. His smoking use included Pipe and Cigars. He quit after 10.00 years of use. He has never used smokeless tobacco. He reports that he drinks about 2.4 oz of alcohol per week . He reports that he does not use drugs.  Family History    Problem Relation Age of Onset  . Stroke Mother 64  . Hypertension Mother   . Diabetes Mother   . Heart disease Mother   . Rectal cancer Father 78  . Heart disease Father     in 51s  . Cancer Father     colorectal  . Nephritis Sister   . Seizures Brother     died as child    Medications: Patient's Medications  New Prescriptions   No medications on file  Previous Medications   ASPIRIN 81 MG TABLET    Take 81 mg by mouth every morning.    ATORVASTATIN (LIPITOR) 20 MG TABLET    TAKE ONE TABLET BY MOUTH ONCE DAILY FOR CHOLESTEROL   BIMATOPROST (LUMIGAN) 0.01 % SOLN    Place 1 drop into both eyes at bedtime.   BUPROPION (WELLBUTRIN XL) 300 MG 24 HR TABLET    Take 300 mg by mouth every morning.    CLONAZEPAM (KLONOPIN)  0.5 MG TABLET    Take one daily for anxiety   FLUTICASONE (CUTIVATE) 0.05 % CREAM    Apply twice daily to affected area   HYDRALAZINE (APRESOLINE) 25 MG TABLET    Take 1 tablet (25 mg total) by mouth 4 (four) times daily. Follow up with your regular doctor to discuss titration of medication   KETOCONAZOLE (NIZORAL) 2 % CREAM       KRILL OIL 500 MG CAPS    Take 500 mg by mouth every morning.    LEVOTHYROXINE (SYNTHROID) 50 MCG TABLET    Take 1/2 tablet by mouth on Sunday mornings and Take one tablet by mouth every other morning for thryoid.   LINZESS 290 MCG CAPS CAPSULE    TAKE ONE CAPSULE BY MOUTH DAILY   MIRTAZAPINE (REMERON) 45 MG TABLET    Take 45 mg by mouth at bedtime.   MULTIPLE VITAMINS-MINERALS (ICAPS AREDS FORMULA PO)    Take 1 capsule by mouth daily.    POLYETHYLENE GLYCOL (MIRALAX / GLYCOLAX) PACKET    Take 17 g by mouth daily as needed for mild constipation.   SERTRALINE (ZOLOFT) 100 MG TABLET    Take two tablets by mouth once daily in the morning   SYNTHROID 50 MCG TABLET    TAKE 1/2 TABLET BY MOUTH ON SUNDAY MORNINGS AND ONE TABLET BY MOUTH EVERY OTHER MORNING FOR THYROID. *NEEDS APPT   TAMSULOSIN (FLOMAX) 0.4 MG CAPS CAPSULE    TAKE 1 CAPSULE (0.4 MG  TOTAL) BY MOUTH DAILY.   TOPIRAMATE (TOPAMAX) 25 MG TABLET    TAKE ONE TABLET BY MOUTH EVERY MORNING AND 2 TABLETS IN THE EVENING  Modified Medications   No medications on file  Discontinued Medications   TESTOSTERONE ([name]) 50 MG/5GM GEL    Place 5 g onto the skin every morning.      Physical Exam:  Vitals:   02/03/16 1519  BP: 132/78  Pulse: 62  Resp: 18  Temp: 97.9 F (36.6 C)  TempSrc: Oral  SpO2: 96%  Weight: 148 lb 9.6 oz (67.4 kg)  Height: 6\' 2"  (1.88 m)   Body mass index is 19.08 kg/m.  Physical Exam  Constitutional: He is oriented to person, place, and time. He appears well-developed.  frail  HENT:  Head: Normocephalic.  Right Ear: External ear normal.  Left Ear: External ear normal.  Mouth/Throat: Oropharynx is clear and moist.  Eyes: Conjunctivae are normal. Pupils are equal, round, and reactive to light.  Neck: Normal range of motion. Neck supple.  Cardiovascular: Normal rate, regular rhythm and normal heart sounds.   Pulmonary/Chest: Effort normal. He has decreased breath sounds.  Abdominal: Soft. Bowel sounds are normal.  Neurological: He is alert and oriented to person, place, and time.  Skin: Skin is warm and dry.  Psychiatric: He has a normal mood and affect. His behavior is normal. Judgment and thought content normal.    Labs reviewed: Basic Metabolic Panel:  Recent Labs  12/20/15 1740 02/02/16 0830  NA 140 141  K 4.2 4.4  CL 111 108  CO2 22 23  GLUCOSE 89 84  BUN 33* 37*  CREATININE 1.14 1.24*  CALCIUM 8.9 9.4  TSH  --  4.52*   Liver Function Tests:  Recent Labs  12/20/15 1740 02/02/16 0830  AST 19 18  ALT 17 15  ALKPHOS 41 42  BILITOT 0.5 0.5  PROT 6.5 7.1  ALBUMIN 3.6 4.0   No results for input(s): LIPASE, AMYLASE in the last 8760  hours. No results for input(s): AMMONIA in the last 8760 hours. CBC:  Recent Labs  12/20/15 1740  WBC 6.7  NEUTROABS 5.1  HGB 14.1  HCT 44.1  MCV 94.6  PLT 127*   Lipid  Panel:  Recent Labs  02/02/16 0830  CHOL 160  HDL 49  LDLCALC 95  TRIG 78  CHOLHDL 3.3   TSH:  Recent Labs  02/02/16 0830  TSH 4.52*   A1C: Lab Results  Component Value Date   HGBA1C 5.3 07/31/2012     Assessment/Plan 1. Acute upper respiratory infection - Mucinex DM 1 tablet twice a day with a full glass of water for cough and congestion. - Saline nasal spray as needed throughout the day for nasal congestion. - Tylenol 650 mg Q6H PRN for pain or fever - do not take more than 9 tablets in 24 hours - DG Chest 2 View  - doxycycline (VIBRA-TABS) 100 MG tablet; Take 1 tablet (100 mg total) by mouth 2 (two) times daily.  Dispense: 14 tablet; Refill: 0 - Education provided on adult upper respiratory infection.  Patient to call if symptoms worsen or do not improve.   Carlos American. Harle Battiest  [facility] [phone] 8 am - 5 pm) [phone] (after hours)

## 2016-02-04 ENCOUNTER — Ambulatory Visit
Admission: RE | Admit: 2016-02-04 | Discharge: 2016-02-04 | Disposition: A | Discharge status: Home / Self Care | Payer: Medicare Other | Source: Ambulatory Visit | Attending: Nurse Practitioner | Admitting: Nurse Practitioner

## 2016-02-10 ENCOUNTER — Encounter: Payer: Self-pay | Admitting: Internal Medicine

## 2016-02-10 ENCOUNTER — Non-Acute Institutional Stay: Payer: Medicare Other | Admitting: Internal Medicine

## 2016-02-10 VITALS — BP 138/78 | HR 56 | Temp 97.5°F | Ht 74.0 in | Wt 152.0 lb

## 2016-02-10 DIAGNOSIS — B9789 Other viral agents as the cause of diseases classified elsewhere: Secondary | ICD-10-CM

## 2016-02-10 DIAGNOSIS — E782 Mixed hyperlipidemia: Secondary | ICD-10-CM

## 2016-02-10 DIAGNOSIS — R262 Difficulty in walking, not elsewhere classified: Secondary | ICD-10-CM | POA: Diagnosis not present

## 2016-02-10 DIAGNOSIS — R609 Edema, unspecified: Secondary | ICD-10-CM

## 2016-02-10 DIAGNOSIS — I10 Essential (primary) hypertension: Secondary | ICD-10-CM

## 2016-02-10 DIAGNOSIS — J069 Acute upper respiratory infection, unspecified: Secondary | ICD-10-CM | POA: Diagnosis not present

## 2016-02-10 DIAGNOSIS — E039 Hypothyroidism, unspecified: Secondary | ICD-10-CM | POA: Diagnosis not present

## 2016-02-10 DIAGNOSIS — K5909 Other constipation: Secondary | ICD-10-CM

## 2016-02-10 NOTE — Progress Notes (Signed)
Facility  [facility]    Place of Service: Clinic (12)     Allergies  Allergen Reactions  . Latex Rash  . Tape Rash    Paper tape is ok to use     Chief Complaint  Patient presents with  . Medical Management of Chronic Issues    2 month blood pressure, edema, review labs.    HPI:   Edema, unspecified type - implroved  Viral upper respiratory tract infection - Seen on 02/03/16 at [facility] by Joelene Millin for URTI. She recommended Mucinex, Nasal saline, and Tylenol. He says he is doing better. Little cough.  Essential hypertension - controlled  HYPERLIPIDEMIA - controlled  Hypothyroidism, unspecified type - compensated  Difficulty walking - no falls in the last month. Using 4 wheel walker with brakes and seat.  Chronic constipation - doing OK on Linzess and Miralax.    Medications: Patient's Medications  New Prescriptions   No medications on file  Previous Medications   ASPIRIN 81 MG TABLET    Take 81 mg by mouth every morning.    ATORVASTATIN (LIPITOR) 20 MG TABLET    TAKE ONE TABLET BY MOUTH ONCE DAILY FOR CHOLESTEROL   BIMATOPROST (LUMIGAN) 0.01 % SOLN    Place 1 drop into both eyes at bedtime.   BUPROPION (WELLBUTRIN XL) 300 MG 24 HR TABLET    Take 300 mg by mouth every morning.    CLONAZEPAM (KLONOPIN) 0.5 MG TABLET    Take one daily for anxiety   DOXYCYCLINE (VIBRA-TABS) 100 MG TABLET    Take 1 tablet (100 mg total) by mouth 2 (two) times daily.   FLUTICASONE (CUTIVATE) 0.05 % CREAM    Apply twice daily to affected area   HYDRALAZINE (APRESOLINE) 25 MG TABLET    Take 1 tablet (25 mg total) by mouth 4 (four) times daily. Follow up with your regular doctor to discuss titration of medication   KETOCONAZOLE (NIZORAL) 2 % CREAM       KRILL OIL 500 MG CAPS    Take 500 mg by mouth every morning.    LEVOTHYROXINE (SYNTHROID) 50 MCG TABLET    Take 1/2 tablet by mouth on Sunday mornings and Take one tablet by mouth every other morning for thryoid.   LINZESS 290 MCG CAPS CAPSULE     TAKE ONE CAPSULE BY MOUTH DAILY   MIRTAZAPINE (REMERON) 45 MG TABLET    Take 45 mg by mouth at bedtime.   MULTIPLE VITAMINS-MINERALS (ICAPS AREDS FORMULA PO)    Take 1 capsule by mouth daily.    POLYETHYLENE GLYCOL (MIRALAX / GLYCOLAX) PACKET    Take 17 g by mouth daily as needed for mild constipation.   SERTRALINE (ZOLOFT) 100 MG TABLET    Take two tablets by mouth once daily in the morning   SYNTHROID 50 MCG TABLET    TAKE 1/2 TABLET BY MOUTH ON SUNDAY MORNINGS AND ONE TABLET BY MOUTH EVERY OTHER MORNING FOR THYROID. *NEEDS APPT   TAMSULOSIN (FLOMAX) 0.4 MG CAPS CAPSULE    TAKE 1 CAPSULE (0.4 MG TOTAL) BY MOUTH DAILY.   TOPIRAMATE (TOPAMAX) 25 MG TABLET    TAKE ONE TABLET BY MOUTH EVERY MORNING AND 2 TABLETS IN THE EVENING  Modified Medications   No medications on file  Discontinued Medications   No medications on file     Review of Systems  Constitutional: Positive for activity change and fatigue. Negative for appetite change, chills, diaphoresis, fever and unexpected weight change.  Complains of generalized weakness  HENT: Positive for hearing loss. Negative for sore throat, trouble swallowing and voice change.   Eyes: Positive for visual disturbance (corrective lenses).       History of wet macular degeneration  Respiratory: Positive for shortness of breath (On exertion).   Cardiovascular: Positive for leg swelling. Negative for chest pain and palpitations.  Gastrointestinal: Positive for constipation. Negative for abdominal distention, abdominal pain, nausea and vomiting.  Endocrine:       Hypothyroid. Testosterone deficiency.  Genitourinary: Positive for difficulty urinating, enuresis, frequency and urgency. Negative for discharge, dysuria, flank pain and hematuria.  Musculoskeletal: Positive for arthralgias, back pain and gait problem.       Unstable gait and hx falls. Knee pains, particularly in the right knee. Cervical spinal stenosis with off and on pains in the  neck.. Right lower leg edematous at 2+.  Skin: Negative.   Neurological: Positive for tremors, weakness and headaches. Negative for dizziness, seizures, syncope, facial asymmetry, light-headedness and numbness.       Recent altered mentation when ill. Mild dementia. History of cerebral atrophy and cerebrovascular disease. History of cervical spondylosis with spinal cord stenosis.  Hematological: Negative.   Psychiatric/Behavioral: Negative for agitation. The patient is nervous/anxious.        Hx depression    Vitals:   02/10/16 1000  BP: 138/78  Pulse: (!) 56  Temp: 97.5 F (36.4 C)  TempSrc: Oral  SpO2: 99%  Weight: 152 lb (68.9 kg)  Height: '6\' 2"'$  (1.88 m)   Wt Readings from Last 3 Encounters:  02/10/16 152 lb (68.9 kg)  02/03/16 148 lb 9.6 oz (67.4 kg)  01/01/16 155 lb 3.2 oz (70.4 kg)    Body mass index is 19.52 kg/m.  Physical Exam  Constitutional: He is oriented to person, place, and time.  Thin, frail  HENT:  Head: Normocephalic and atraumatic.  Hearing loss  Eyes: Conjunctivae and EOM are normal. Pupils are equal, round, and reactive to light.  Neck: No JVD present. No tracheal deviation present. No thyromegaly present.  Cardiovascular: Normal rate, regular rhythm and normal heart sounds.  Exam reveals no gallop and no friction rub.   No murmur heard. Pulmonary/Chest: No respiratory distress. He has no wheezes. He has no rales. He exhibits no tenderness.  Abdominal: He exhibits no distension and no mass. There is no tenderness.  Musculoskeletal: Normal range of motion. He exhibits edema (right calf 2+). He exhibits no tenderness.  Lurching, unstable gait. Using four-wheel walker. Right knee pain.  Lymphadenopathy:    He has no cervical adenopathy.  Neurological: He is alert and oriented to person, place, and time. No cranial nerve deficit. Coordination abnormal.  Tremor bilaterally. Unstable gait.  Skin: No rash noted. No erythema. No pallor.  Each callus ball  of right foot. Hemorrhage under the callus.  Psychiatric: He has a normal mood and affect. His behavior is normal. Thought content normal.     Labs reviewed: Lab Summary Latest Ref Rng & Units 02/02/2016 12/20/2015 12/02/2014  Hemoglobin 13.0 - 17.0 g/dL (None) 14.1 14.3  Hematocrit 39.0 - 52.0 % (None) 44.1 44.1  White count 4.0 - 10.5 K/uL (None) 6.7 5.6  Platelet count 150 - 400 K/uL (None) 127(L) 116(L)  Sodium 135 - 146 mmol/L 141 140 140  Potassium 3.5 - 5.3 mmol/L 4.4 4.2 4.6  Calcium 8.6 - 10.3 mg/dL 9.4 8.9 8.8(L)  Phosphorus - (None) (None) (None)  Creatinine 0.70 - 1.11 mg/dL 1.24(H) 1.14 1.27(H)  AST  10 - 35 U/L 18 19 (None)  Alk Phos 40 - 115 U/L 42 41 (None)  Bilirubin 0.2 - 1.2 mg/dL 0.5 0.5 (None)  Glucose 65 - 99 mg/dL 84 89 85  Cholesterol <200 mg/dL 160 (None) (None)  HDL cholesterol >40 mg/dL 49 (None) (None)  Triglycerides <150 mg/dL 78 (None) (None)  LDL Direct - (None) (None) (None)  LDL Calc <100 mg/dL 95 (None) (None)  Total protein 6.1 - 8.1 g/dL 7.1 6.5 (None)  Albumin 3.6 - 5.1 g/dL 4.0 3.6 (None)  Some recent data might be hidden   Lab Results  Component Value Date   TSH 4.52 (H) 02/02/2016   Lab Results  Component Value Date   BUN 37 (H) 02/02/2016   BUN 33 (H) 12/20/2015   BUN 35 (H) 12/02/2014   Lab Results  Component Value Date   CREATININE 1.24 (H) 02/02/2016   CREATININE 1.14 12/20/2015   CREATININE 1.27 (H) 12/02/2014   Lab Results  Component Value Date   HGBA1C 5.3 07/31/2012   HGBA1C 5.4 03/16/2011   HGBA1C 5.4 02/26/2008       Assessment/Plan  1. Viral upper respiratory tract infection improved  2. Edema, unspecified type improved  3. Essential hypertension controlled  4. HYPERLIPIDEMIA controlled  5. Hypothyroidism, unspecified type compensated  6. Difficulty walking Continue to use walker and walk more if possible  7. Chronic constipation The current medical regimen is effective;  continue present plan  and medications.

## 2016-02-13 ENCOUNTER — Other Ambulatory Visit: Payer: Self-pay | Admitting: Internal Medicine

## 2016-03-02 ENCOUNTER — Non-Acute Institutional Stay: Payer: Medicare Other | Admitting: Internal Medicine

## 2016-03-02 ENCOUNTER — Other Ambulatory Visit: Payer: Self-pay | Admitting: *Deleted

## 2016-03-02 ENCOUNTER — Encounter: Payer: Self-pay | Admitting: Internal Medicine

## 2016-03-02 VITALS — BP 116/62 | HR 56 | Temp 97.5°F | Ht 74.0 in | Wt 151.0 lb

## 2016-03-02 DIAGNOSIS — I10 Essential (primary) hypertension: Secondary | ICD-10-CM | POA: Diagnosis not present

## 2016-03-02 MED ORDER — ATORVASTATIN CALCIUM 20 MG PO TABS: ORAL_TABLET | ORAL | 3 refills | Status: DC

## 2016-03-02 NOTE — Telephone Encounter (Signed)
Jose Leach 

## 2016-03-02 NOTE — Progress Notes (Signed)
Facility  [facility]    Place of Service: Clinic (12)     Allergies  Allergen Reactions  . Latex Rash  . Tape Rash    Paper tape is ok to use     Chief Complaint  Patient presents with  . Medical Management of Chronic Issues    medication management blood pressure. Ran out of Hydralazine (that he got at ED) one week ago . BP taken 02/25/16 by nurse 118/68 pulse 60     HPI:  Patient had high BP in Nov 2017 when seen in ER for hematuria. Was started on hydralazine. Ran out of this med about 1 week ago. BP has remained normal. He woukd like to stay off it. Denies headache or palpitations. Has some pedal edema.  Medications: Patient's Medications  New Prescriptions   No medications on file  Previous Medications   ASPIRIN 81 MG TABLET    Take 81 mg by mouth every morning.    ATORVASTATIN (LIPITOR) 20 MG TABLET    TAKE ONE TABLET BY MOUTH ONCE DAILY FOR CHOLESTEROL   BIMATOPROST (LUMIGAN) 0.01 % SOLN    Place 1 drop into both eyes at bedtime.   BUPROPION (WELLBUTRIN XL) 300 MG 24 HR TABLET    Take 300 mg by mouth every morning.    CLONAZEPAM (KLONOPIN) 0.5 MG TABLET    Take one daily for anxiety   DOXYCYCLINE (VIBRA-TABS) 100 MG TABLET    Take 1 tablet (100 mg total) by mouth 2 (two) times daily.   FLUTICASONE (CUTIVATE) 0.05 % CREAM    Apply twice daily to affected area   HYDRALAZINE (APRESOLINE) 25 MG TABLET    Take 1 tablet (25 mg total) by mouth 4 (four) times daily. Follow up with your regular doctor to discuss titration of medication   KETOCONAZOLE (NIZORAL) 2 % CREAM       KRILL OIL 500 MG CAPS    Take 500 mg by mouth every morning.    LEVOTHYROXINE (SYNTHROID) 50 MCG TABLET    Take 1/2 tablet by mouth on Sunday mornings and Take one tablet by mouth every other morning for thryoid.   LINZESS 290 MCG CAPS CAPSULE    TAKE ONE CAPSULE BY MOUTH DAILY   MIRTAZAPINE (REMERON) 30 MG TABLET    Take two tablets at bedtime to help with rest   MULTIPLE VITAMINS-MINERALS (ICAPS AREDS  FORMULA PO)    Take 1 capsule by mouth daily.    POLYETHYLENE GLYCOL (MIRALAX / GLYCOLAX) PACKET    Take 17 g by mouth daily as needed for mild constipation.   SERTRALINE (ZOLOFT) 100 MG TABLET    Take two tablets by mouth once daily in the morning   SYNTHROID 50 MCG TABLET    TAKE 1/2 TABLET BY MOUTH ON SUNDAY MORNINGS AND ONE TABLET BY MOUTH EVERY OTHER MORNING FOR THYROID. *NEEDS APPT   TAMSULOSIN (FLOMAX) 0.4 MG CAPS CAPSULE    TAKE 1 CAPSULE (0.4 MG TOTAL) BY MOUTH DAILY.   TOPIRAMATE (TOPAMAX) 25 MG TABLET    TAKE ONE TABLET BY MOUTH EVERY MORNING AND 2 TABLETS IN THE EVENING  Modified Medications   No medications on file  Discontinued Medications   MIRTAZAPINE (REMERON) 45 MG TABLET    Take 45 mg by mouth at bedtime.     Review of Systems  Constitutional: Positive for activity change and fatigue. Negative for appetite change, chills, diaphoresis, fever and unexpected weight change.       Complains of  generalized weakness  HENT: Positive for hearing loss. Negative for sore throat, trouble swallowing and voice change.   Eyes: Positive for visual disturbance (corrective lenses).       History of wet macular degeneration  Respiratory: Positive for shortness of breath (On exertion).   Cardiovascular: Positive for leg swelling (1+ bipedal). Negative for chest pain and palpitations.  Gastrointestinal: Positive for constipation. Negative for abdominal distention, abdominal pain, nausea and vomiting.  Endocrine:       Hypothyroid. Testosterone deficiency.  Genitourinary: Positive for difficulty urinating, enuresis, frequency and urgency. Negative for discharge, dysuria, flank pain and hematuria.  Musculoskeletal: Positive for arthralgias, back pain and gait problem.       Unstable gait and hx falls. Knee pains, particularly in the right knee. Cervical spinal stenosis with off and on pains in the neck.. Right lower leg edematous at 2+.  Skin: Negative.   Neurological: Positive for tremors,  weakness and headaches. Negative for dizziness, seizures, syncope, facial asymmetry, light-headedness and numbness.       Recent altered mentation when ill. Mild dementia. History of cerebral atrophy and cerebrovascular disease. History of cervical spondylosis with spinal cord stenosis.  Hematological: Negative.   Psychiatric/Behavioral: Negative for agitation. The patient is nervous/anxious.        Hx depression    Vitals:   03/02/16 0852  BP: 116/62  Pulse: (!) 56  Temp: 97.5 F (36.4 C)  TempSrc: Oral  SpO2: 98%  Weight: 151 lb (68.5 kg)  Height: _0  (1.88 m)   Wt Readings from Last 3 Encounters:  03/02/16 151 lb (68.5 kg)  02/10/16 152 lb (68.9 kg)  02/03/16 148 lb 9.6 oz (67.4 kg)    Body mass index is 19.39 kg/m.  Physical Exam  Constitutional: He is oriented to person, place, and time.  Thin, frail  HENT:  Head: Normocephalic and atraumatic.  Hearing loss  Eyes: Conjunctivae and EOM are normal. Pupils are equal, round, and reactive to light.  Neck: No JVD present. No tracheal deviation present. No thyromegaly present.  Cardiovascular: Normal rate, regular rhythm and normal heart sounds.  Exam reveals no gallop and no friction rub.   No murmur heard. Pulmonary/Chest: No respiratory distress. He has no wheezes. He has no rales. He exhibits no tenderness.  Abdominal: He exhibits no distension and no mass. There is no tenderness.  Musculoskeletal: Normal range of motion. He exhibits edema (1+ bipedal). He exhibits no tenderness.  Lurching, unstable gait. Using four-wheel walker. Right knee pain.  Lymphadenopathy:    He has no cervical adenopathy.  Neurological: He is alert and oriented to person, place, and time. No cranial nerve deficit. Coordination abnormal.  Tremor bilaterally. Unstable gait.  Skin: No rash noted. No erythema. No pallor.  Each callus ball of right foot. Hemorrhage under the callus.  Psychiatric: He has a normal mood and affect. His behavior is  normal. Thought content normal.     Labs reviewed: Lab Summary Latest Ref Rng & Units 02/02/2016 12/20/2015 12/02/2014  Hemoglobin 13.0 - 17.0 g/dL (None) 14.1 14.3  Hematocrit 39.0 - 52.0 % (None) 44.1 44.1  White count 4.0 - 10.5 K/uL (None) 6.7 5.6  Platelet count 150 - 400 K/uL (None) 127(L) 116(L)  Sodium 135 - 146 mmol/L 141 140 140  Potassium 3.5 - 5.3 mmol/L 4.4 4.2 4.6  Calcium 8.6 - 10.3 mg/dL 9.4 8.9 8.8(L)  Phosphorus - (None) (None) (None)  Creatinine 0.70 - 1.11 mg/dL 1.24(H) 1.14 1.27(H)  AST 10 - 35  U/L 18 19 (None)  Alk Phos 40 - 115 U/L 42 41 (None)  Bilirubin 0.2 - 1.2 mg/dL 0.5 0.5 (None)  Glucose 65 - 99 mg/dL 84 89 85  Cholesterol <200 mg/dL 160 (None) (None)  HDL cholesterol >40 mg/dL 49 (None) (None)  Triglycerides <150 mg/dL 78 (None) (None)  LDL Direct - (None) (None) (None)  LDL Calc <100 mg/dL 95 (None) (None)  Total protein 6.1 - 8.1 g/dL 7.1 6.5 (None)  Albumin 3.6 - 5.1 g/dL 4.0 3.6 (None)  Some recent data might be hidden   Lab Results  Component Value Date   TSH 4.52 (H) 02/02/2016   Lab Results  Component Value Date   BUN 37 (H) 02/02/2016   BUN 33 (H) 12/20/2015   BUN 35 (H) 12/02/2014   Lab Results  Component Value Date   CREATININE 1.24 (H) 02/02/2016   CREATININE 1.14 12/20/2015   CREATININE 1.27 (H) 12/02/2014   Lab Results  Component Value Date   HGBA1C 5.3 07/31/2012   HGBA1C 5.4 03/16/2011   HGBA1C 5.4 02/26/2008       Assessment/Plan  1. Essential hypertension -Stop hydralazine

## 2016-04-08 ENCOUNTER — Other Ambulatory Visit: Payer: Self-pay | Admitting: Internal Medicine

## 2016-05-13 ENCOUNTER — Emergency Department (HOSPITAL_BASED_OUTPATIENT_CLINIC_OR_DEPARTMENT_OTHER): Payer: Medicare Other

## 2016-05-13 ENCOUNTER — Encounter (HOSPITAL_BASED_OUTPATIENT_CLINIC_OR_DEPARTMENT_OTHER): Payer: Self-pay

## 2016-05-13 ENCOUNTER — Emergency Department (HOSPITAL_BASED_OUTPATIENT_CLINIC_OR_DEPARTMENT_OTHER)
Admission: EM | Admit: 2016-05-13 | Discharge: 2016-05-13 | Disposition: A | Discharge status: Home / Self Care | Payer: Medicare Other | Attending: Emergency Medicine | Admitting: Emergency Medicine

## 2016-05-13 DIAGNOSIS — I10 Essential (primary) hypertension: Secondary | ICD-10-CM | POA: Insufficient documentation

## 2016-05-13 DIAGNOSIS — R410 Disorientation, unspecified: Secondary | ICD-10-CM | POA: Diagnosis not present

## 2016-05-13 DIAGNOSIS — Z7982 Long term (current) use of aspirin: Secondary | ICD-10-CM | POA: Insufficient documentation

## 2016-05-13 DIAGNOSIS — I2510 Atherosclerotic heart disease of native coronary artery without angina pectoris: Secondary | ICD-10-CM | POA: Insufficient documentation

## 2016-05-13 DIAGNOSIS — Z87891 Personal history of nicotine dependence: Secondary | ICD-10-CM | POA: Insufficient documentation

## 2016-05-13 DIAGNOSIS — E039 Hypothyroidism, unspecified: Secondary | ICD-10-CM | POA: Insufficient documentation

## 2016-05-13 DIAGNOSIS — R4182 Altered mental status, unspecified: Secondary | ICD-10-CM | POA: Diagnosis present

## 2016-05-13 LAB — COMPREHENSIVE METABOLIC PANEL
ALBUMIN: 3.2 g/dL — AB (ref 3.5–5.0)
ALK PHOS: 38 U/L (ref 38–126)
ALT: 15 U/L — AB (ref 17–63)
AST: 20 U/L (ref 15–41)
Anion gap: 7 (ref 5–15)
BUN: 36 mg/dL — ABNORMAL HIGH (ref 6–20)
CALCIUM: 8.4 mg/dL — AB (ref 8.9–10.3)
CHLORIDE: 109 mmol/L (ref 101–111)
CO2: 24 mmol/L (ref 22–32)
CREATININE: 1.2 mg/dL (ref 0.61–1.24)
GFR calc Af Amer: >60 mL/min (ref >60)
GFR calc non Af Amer: 54 mL/min — ABNORMAL LOW (ref >60)
GLUCOSE: 119 mg/dL — AB (ref 65–99)
Potassium: 4.1 mmol/L (ref 3.5–5.1)
Sodium: 140 mmol/L (ref 135–145)
Total Bilirubin: 0.4 mg/dL (ref 0.3–1.2)
Total Protein: 5.8 g/dL — ABNORMAL LOW (ref 6.5–8.1)

## 2016-05-13 LAB — URINALYSIS, ROUTINE W REFLEX MICROSCOPIC
BILIRUBIN URINE: NEGATIVE
GLUCOSE, UA: NEGATIVE mg/dL
HGB URINE DIPSTICK: NEGATIVE
KETONES UR: NEGATIVE mg/dL
Leukocytes, UA: NEGATIVE
Nitrite: NEGATIVE
PH: 6 (ref 5.0–8.0)
Protein, ur: NEGATIVE mg/dL
SPECIFIC GRAVITY, URINE: 1.019 (ref 1.005–1.030)

## 2016-05-13 LAB — CBC WITH DIFFERENTIAL/PLATELET
BASOS PCT: 0 %
Basophils Absolute: 0 10*3/uL (ref 0.0–0.1)
Eosinophils Absolute: 0.1 10*3/uL (ref 0.0–0.7)
Eosinophils Relative: 2 %
HEMATOCRIT: 40.2 % (ref 39.0–52.0)
HEMOGLOBIN: 13.1 g/dL (ref 13.0–17.0)
LYMPHS ABS: 0.9 10*3/uL (ref 0.7–4.0)
Lymphocytes Relative: 19 %
MCH: 30.2 pg (ref 26.0–34.0)
MCHC: 32.6 g/dL (ref 30.0–36.0)
MCV: 92.6 fL (ref 78.0–100.0)
MONO ABS: 0.4 10*3/uL (ref 0.1–1.0)
MONOS PCT: 8 %
NEUTROS ABS: 3.3 10*3/uL (ref 1.7–7.7)
NEUTROS PCT: 71 %
Platelets: 128 10*3/uL — ABNORMAL LOW (ref 150–400)
RBC: 4.34 MIL/uL (ref 4.22–5.81)
RDW: 14.3 % (ref 11.5–15.5)
WBC: 4.7 10*3/uL (ref 4.0–10.5)

## 2016-05-13 MED ORDER — SODIUM CHLORIDE 0.9 % IV BOLUS (SEPSIS)
500.0000 mL | Freq: Once | INTRAVENOUS | Status: AC
  Administered 2016-05-13: 500 mL via INTRAVENOUS

## 2016-05-13 NOTE — ED Notes (Signed)
Pt came to ED, states he went to his MD office, was having difficulty with knowing year and having difficulty answering some questions. Pts wife at bedside, along with daughter. Wife states has not been as talkative lately. This has been occurring for the past week

## 2016-05-13 NOTE — ED Notes (Signed)
Bedside Handoff report given to Ruth-RN

## 2016-05-13 NOTE — Discharge Instructions (Signed)
Return to the emergency department if symptoms significantly worsen or change.  Follow-up with your primary Dr. if symptoms persist to discuss referral to a neurologist.

## 2016-05-13 NOTE — ED Notes (Signed)
Denies being ill and having a good appetite. Pt has some tremors noted. Pt and family denies any alcohol use. Pt on cont cardiac monitor with cont POX and int NBP

## 2016-05-13 NOTE — ED Notes (Signed)
Patient transported to CT via stretcher, sr x 2 up, off cardiac monitor per EDP orders

## 2016-05-13 NOTE — ED Provider Notes (Signed)
[facility] Provider Note   CSN: [id number] Arrival date & time: 05/13/16  1912   By signing my name below, I, Soijett Blue, attest that this documentation has been prepared under the direction and in the presence of Veryl Speak, MD. Electronically Signed: [place], ED Scribe. 05/13/16. 9:13 PM.  History   Chief Complaint Chief Complaint  Patient presents with  . Altered Mental Status    HPI DOMINIE BENEDICK is a 83 y.o. male with a PMHx of HTN, who presents to the Emergency Department complaining of AMS onset yesterday. Pt reports associated loose watery stool x yesterday. Pt has not tried any medications for the relief of his symptoms. Pt notes that he was seeing his psychiatrist yesterday for depression, he informed her that it was 1978 instead of 2018. Wife reports that the psychiatrist wanted the pt to be evaluated for any underlying health issues. Wife states that that the pt has been "not as involved or talkative" x 1-2 weeks. Wife denies any recent stressors. Denies new medications or ETOH use. He denies blood in stool and any other symptoms. Denies hx of DM.   The history is provided by the patient. No language interpreter was used.  Altered Mental Status   This is a new problem. The current episode started yesterday. His past medical history is significant for hypertension. His past medical history does not include diabetes.    Past Medical History:  Diagnosis Date  . Acute urinary retention 01/28/2013  . AKI (acute kidney injury) ([place]) 01/26/2013  . Altered mental status 09/09/2013  . Anemia    B12 deficiency  . ANXIETY 06/07/2007   Qualifier: Diagnosis of  By: Talbert Cage CMA ([place]), June    . ARTHRITIS 06/07/2007   Qualifier: Diagnosis of  By: Talbert Cage CMA ([place]), June    . Balance problems 04/05/2011  . Benign essential tremor   . Benign fibroma of prostate 10/11/2011  . Bilateral inguinal hernia 10/09/2012  . Bilateral leg edema 09/13/2012  . Bladder  retention 02/05/2013  . Bradycardia, sinus 08/02/2012  . CAROTID ARTERY DISEASE 03/31/2007   Qualifier: Diagnosis of  By: Linna Darner MD, Gwyndolyn Saxon    . Carotid stenosis    s/p L CEA  . Cataract, nuclear 03/21/2014  . 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 Cervical spinal stenosis   . Cervical stenosis of spine   . Critical lower limb ischemia   . Degeneration macular 11/02/2011  . Depression    Dr Albertine Patricia  . Difficulty in walking(719.7) 04/05/2011  . ESOPHAGITIS 08/06/2002   Qualifier: Diagnosis of  By: Talbert Cage CMA ([place]), June    . Essential and other specified forms of tremor 04/18/2013  . Falls   . Family history of colon cancer 07/24/2012  . Fecal impaction ([place]) 10/11/2012  . Femoral hernia, bilateral s/p lap repair 01/16/2013 01/16/2013  . Glaucoma, compensated 03/21/2014  . H/O cardiovascular stress test    a. 02/2003 -> no ischemia/infarct.  Marland Kitchen HAMMER TOE 04/23/2010   Qualifier: Diagnosis of  By: Linna Darner MD, Gwyndolyn Saxon    . Hematoma of leg   . Hip hematoma, right 09/09/2013  . History of shingles 2009  . HTN (hypertension) 08/22/2012  . Hx of echocardiogram    Echocardiogram 08/01/12: Mild LVH, EF 55-60%, normal wall motion.  . Hydrocele 10/11/2011  . Hyperlipidemia   . 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 Hypothyroidism   . Macular degeneration disease   . Macular edema   . Migraines    Dr Jannifer [place]  . MORTON'S  NEUROMA, LEFT 08/22/2007   Qualifier: Diagnosis of  By: Linna Darner MD, Gwyndolyn Saxon    . PAD (peripheral artery disease) ([place])   . Peripheral neuropathy ([place])   . Personal history of colonic polyps 01/05/2013   2014 2 mm adenomatous polyp   . PREMATURE ATRIAL CONTRACTIONS 03/14/2006   Qualifier: Diagnosis of  By: Linna Darner MD, Gwendolyn Lima 12/16/2011  . SPINAL STENOSIS 03/14/2006   Qualifier: Diagnosis of  By: Linna Darner MD, William   Cervical spine    . Testosterone deficiency    Dr Lawerance Bach, [facility]  . Unspecified vitamin D deficiency 08/06/2012  . Urge incontinence 10/09/2012  . Urinary  tract infection, site not specified 02/13/2013  . Weakness 08/02/2012    Patient Active Problem List   Diagnosis Date Noted  . URTI (infection of the upper respiratory tract) 02/10/2016  . Edema 12/23/2015  . Callus 07/23/2014  . Fall 07/09/2014  . Depression 07/09/2014  . Pain in left hip 05/22/2014  . AMD (age-related macular degeneration), wet ([place]) 03/21/2014  . Cataract, nuclear 03/21/2014  . Glaucoma, compensated 03/21/2014  . Status post intraocular lens implant 03/21/2014  . Benign prostatic hyperplasia with urinary obstruction 12/27/2013  . Conjunctivitis 10/02/2013  . Knee pain, right 09/25/2013  . Bilateral buttock pain 09/18/2013  . Other testicular hypofunction 09/14/2013  . Hip hematoma, right 09/09/2013  . Altered mental status 09/09/2013  . Benign essential tremor 04/18/2013  . 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 Bladder retention 02/05/2013  . Renal cyst, left 01/30/2013  . Femoral hernia, bilateral s/p lap repair 01/16/2013 01/16/2013  . Personal history of colonic polyps 01/05/2013  . Bilateral inguinal hernia 10/09/2012  . Urge incontinence 10/09/2012  . Bilateral leg edema 09/13/2012  . HTN (hypertension) 08/22/2012  . Unspecified vitamin D deficiency 08/06/2012  . Weakness 08/02/2012  . Bradycardia, sinus 08/02/2012  . Chronic constipation 07/24/2012  . Family history of colon cancer 07/24/2012  . Pseudoaphakia 12/16/2011  . Degeneration macular 11/02/2011  . Benign fibroma of prostate 10/11/2011  . Bladder neck obstruction 10/11/2011  . Hydrocele in adult 10/11/2011  . 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 Difficulty walking 04/05/2011  . Balance problems 04/05/2011  . Hammer toe 04/23/2010  . MORTON'S NEUROMA, LEFT 08/22/2007  . Anxiety state 06/07/2007  . ARTHRITIS 06/07/2007  . CAROTID ARTERY DISEASE 03/31/2007  . Peripheral vascular disease ([place]) 03/16/2007  . Hypothyroidism 11/10/2006  . HYPERLIPIDEMIA 11/10/2006  . MIGRAINE HEADACHE  03/14/2006  . PREMATURE ATRIAL CONTRACTIONS 03/14/2006  . Spinal stenosis of cervical region 03/14/2006  . ESOPHAGITIS 08/06/2002    Past Surgical History:  Procedure Laterality Date  . APPENDECTOMY  1941  . BREAST CYST EXCISION Left 01/16/2013   Procedure: MASS EXCISION AXILLA;  Surgeon: Adin Hector, MD;  Location: [facility];  Service: General;  Laterality: Left;  . CAROTID ENDARTERECTOMY  2005    L  . CATARACT EXTRACTION Right   . COLONOSCOPY  2014   Dr. Deatra Ina  . EYE SURGERY Right 02/2012   retina  . INGUINAL HERNIA REPAIR Bilateral 01/16/2013   Procedure: LAPAROSCOPIC BILATERAL INGUINAL DIRECT AND INDIRECT, FEMORAL, AND OBTURATOR HERNIAS;  Surgeon: Adin Hector, MD;  Location: [facility];  Service: General;  Laterality: Bilateral;  . INSERTION OF MESH N/A 01/16/2013   Procedure: INSERTION OF MESH;  Surgeon: Adin Hector, MD;  Location: [facility];  Service: General;  Laterality: N/A;  . LUMBAR LAMINECTOMY    . TONSILLECTOMY AND ADENOIDECTOMY    . TOTAL HIP ARTHROPLASTY  2005   L       Home Medications    Prior to Admission medications   Medication Sig Start Date End Date Taking? Authorizing Provider  aspirin 81 MG tablet Take 81 mg by mouth every morning.     Historical Provider, MD  atorvastatin (LIPITOR) 20 MG tablet Take one tablet by mouth once daily for cholesterol 03/02/16   Estill Dooms, MD  bimatoprost (LUMIGAN) 0.01 % SOLN Place 1 drop into both eyes at bedtime.    Historical Provider, MD  buPROPion (WELLBUTRIN XL) 300 MG 24 hr tablet Take 300 mg by mouth every morning.     Historical Provider, MD  clonazePAM Bobbye [place]) 0.5 MG tablet Take one daily for anxiety 10/14/15   Historical Provider, MD  fluticasone (CUTIVATE) 0.05 % cream Apply twice daily to affected area 01/15/14   Historical Provider, MD  ketoconazole (NIZORAL) 2 % cream  06/30/14   Historical Provider, MD  Javier Docker Oil 500 MG CAPS Take 500 mg by mouth every morning.     Historical Provider, MD  levothyroxine  (SYNTHROID) 50 MCG tablet Take 1/2 tablet by mouth on Sunday mornings and Take one tablet by mouth every other morning for thryoid. 05/16/15   Estill Dooms, MD  LINZESS 290 MCG CAPS capsule TAKE ONE CAPSULE BY MOUTH DAILY 04/08/16   Estill Dooms, MD  mirtazapine (REMERON) 30 MG tablet Take two tablets at bedtime to help with rest 02/16/16   Historical Provider, MD  Multiple Vitamins-Minerals (ICAPS AREDS FORMULA PO) Take 1 capsule by mouth daily.     Historical Provider, MD  polyethylene glycol (MIRALAX / GLYCOLAX) packet Take 17 g by mouth daily as needed for mild constipation.    Historical Provider, MD  sertraline (ZOLOFT) 100 MG tablet Take two tablets by mouth once daily in the morning    Historical Provider, MD  SYNTHROID 50 MCG tablet TAKE 1/2 TABLET BY MOUTH ON SUNDAY MORNINGS AND ONE TABLET BY MOUTH EVERY OTHER MORNING FOR THYROID. *NEEDS APPT 10/27/15   Estill Dooms, MD  tamsulosin (FLOMAX) 0.4 MG CAPS capsule TAKE 1 CAPSULE (0.4 MG TOTAL) BY MOUTH DAILY. 05/24/14   Historical Provider, MD  topiramate (TOPAMAX) 25 MG tablet TAKE ONE TABLET BY MOUTH EVERY MORNING AND 2 TABLETS IN THE EVENING 01/01/16   Dennie Bible, NP    Family History Family History  Problem Relation Age of Onset  . Stroke Mother 42  . Hypertension Mother   . Diabetes Mother   . Heart disease Mother   . Rectal cancer Father 71  . Heart disease Father     in 40s  . Cancer Father     colorectal  . Nephritis Sister   . Seizures Brother     died as child    Social History Social History  Substance Use Topics  . Smoking status: Former Smoker    Years: 10.00    Types: Pipe, Cigars    Quit date: 02/16/1979  . Smokeless tobacco: Never Used     Comment: Quit about age 44   . Alcohol use 2.4 oz/week    4 Standard drinks or equivalent per week     Comment:  occasional     Allergies   Latex and Tape   Review of Systems Review of Systems  All other systems reviewed and are negative.    Physical  Exam Updated Vital Signs BP 139/60 (BP Location: Left Arm)   Pulse (!) 52   Temp 98.2  F (36.8 C) (Oral)   Resp 16   Ht 6' (1.829 m)   Wt 154 lb (69.9 kg)   SpO2 98%   BMI 20.89 kg/m   Physical Exam  Constitutional: He is oriented to person, place, and time. He appears well-developed and well-nourished. No distress.  HENT:  Head: Normocephalic and atraumatic.  Right Ear: Hearing normal.  Left Ear: Hearing normal.  Nose: Nose normal.  Mouth/Throat: Oropharynx is clear and moist and mucous membranes are normal.  Eyes: EOM are normal.  Neck: Normal range of motion. Neck supple.  Cardiovascular: Normal rate, regular rhythm, S1 normal, S2 normal and normal heart sounds.  Exam reveals no gallop and no friction rub.   No murmur heard. Pulmonary/Chest: Effort normal and breath sounds normal. No respiratory distress. He exhibits no tenderness.  Abdominal: Soft. Normal appearance and bowel sounds are normal. There is no hepatosplenomegaly. There is no tenderness. There is no rebound, no guarding, no tenderness at McBurney's point and negative Murphy's sign. No hernia.  Musculoskeletal: Normal range of motion.  Neurological: He is alert and oriented to person, place, and time. He has normal strength. Coordination normal.  Slight tremor noted in head and arms.   Skin: Skin is warm, dry and intact. No rash noted. No cyanosis.  Psychiatric: He has a normal mood and affect. His speech is normal and behavior is normal. Thought content normal.  Nursing note and vitals reviewed.    ED Treatments / Results  DIAGNOSTIC STUDIES: Oxygen Saturation is 98% on RA, nl by my interpretation.    COORDINATION OF CARE: 8:10 PM Discussed treatment plan with pt at bedside which includes CT head, labs, UA, EKG, and pt agreed to plan.   Labs (all labs ordered are listed, but only abnormal results are displayed) Labs Reviewed  COMPREHENSIVE METABOLIC PANEL - Abnormal; Notable for the following:        Result Value   Glucose, Bld 119 (*)    BUN 36 (*)    Calcium 8.4 (*)    Total Protein 5.8 (*)    Albumin 3.2 (*)    ALT 15 (*)    GFR calc non Af Amer 54 (*)    All other components within normal limits  CBC WITH DIFFERENTIAL/PLATELET - Abnormal; Notable for the following:    Platelets 128 (*)    All other components within normal limits  URINALYSIS, ROUTINE W REFLEX MICROSCOPIC    EKG  EKG Interpretation None       Radiology Ct Head Wo Contrast  Result Date: 05/13/2016 CLINICAL DATA:  Altered mental status confusion. History of migraines, hyperlipidemia, hypertension. EXAM: CT HEAD WITHOUT CONTRAST TECHNIQUE: Contiguous axial images were obtained from the base of the skull through the vertex without intravenous contrast. COMPARISON:  CT HEAD December 02, 2014 FINDINGS: BRAIN: No intraparenchymal hemorrhage, mass effect nor midline shift. Moderate to severe ventriculomegaly on the basis of global parenchymal brain volume loss, similar. Patchy supratentorial white matter hypodensities within normal range for patient's age, though non-specific are most compatible with chronic small vessel ischemic disease. No acute large vascular territory infarcts. No abnormal extra-axial fluid collections. Basal cisterns are patent. VASCULAR: Moderate calcific atherosclerosis of the carotid siphons. SKULL: No skull fracture. No significant scalp soft tissue swelling. SINUSES/ORBITS: Trace paranasal sinus mucosal thickening. Mastoid air cells are well aerated. The included ocular globes and orbital contents are non-suspicious. OTHER: None. IMPRESSION: No acute intracranial process. Stable examination including moderate to severe atrophy and moderate chronic small vessel ischemic  disease. Electronically Signed   By: Elon Alas M.D.   On: 05/13/2016 20:46    Procedures Procedures (including critical care time)  Medications Ordered in ED Medications - No data to display   Initial Impression /  Assessment and Plan / ED Course  I have reviewed the triage vital signs and the nursing notes.  Pertinent labs & imaging results that were available during my care of the patient were reviewed by me and considered in my medical decision making (see chart for details).  Patient brought by family at the recommendation of his psychiatrist for further evaluation of intermittent confusion that has been happening over the past 2 weeks. He denies any specific complaints. There've been no fevers and no change in medications. His physical examination is unremarkable. He does have a tremor in his head and arms, however the family states this is baseline. His workup today reveals a urinalysis, unremarkable laboratory studies, and head CT that shows atrophy, however no other acute process.  Kyra Searles this patient may be having episodes of confusion, possibly related to dementia as I have found no other obvious cause. I see no indication for admission and had no further testing to offer this evening. I've advised the family that if these episodes persist, they should follow-up with her primary Dr. to discuss referral to a neurologist.  Final Clinical Impressions(s) / ED Diagnoses   Final diagnoses:  None    New Prescriptions New Prescriptions   No medications on file   I personally performed the services described in this documentation, which was scribed in my presence. The recorded information has been reviewed and is accurate.        Veryl Speak, MD 05/13/16 2131

## 2016-05-13 NOTE — ED Notes (Signed)
ED Provider at bedside. 

## 2016-05-13 NOTE — ED Triage Notes (Signed)
Pt and wife state pt's psychiatrist requested pt be seen for disorientation that was assessed at visit yesterday for chronic depression-pt is A/O and answering ?s-states he does recall stating the wrong year yesterday with psychiatrist but feels he may have had periods of confusion x 1 week-wife denies any new sx noticed-states pt did fall approx 1 week sago but no head injury-pt states fell on knees-pt NAD-slow shaky gait with own walker

## 2016-05-25 ENCOUNTER — Telehealth: Payer: Self-pay

## 2016-05-25 ENCOUNTER — Encounter: Payer: Self-pay | Admitting: Internal Medicine

## 2016-05-25 NOTE — Telephone Encounter (Signed)
Called [phone] not correct number, called [phone] [facility] has it as home number) no answer. Call [phone]  [facility] has it as his cell number) not working. Called wife's cell [phone] left a message for her to call the office to re-schedule missed appointment.

## 2016-05-25 NOTE — Telephone Encounter (Signed)
[facility] had a different home number [phone], spoke with wife. She thought he had changed it. He wasn't there at this time. She would have him call. He already has appt with Dr. Nyoka Cowden 06/08/16, this is 4 month OV, not a physical. We can see him at this appt, but we can't do a physical. She will have him call.

## 2016-06-07 ENCOUNTER — Emergency Department (HOSPITAL_COMMUNITY)
Admission: EM | Admit: 2016-06-07 | Discharge: 2016-06-08 | Disposition: A | Discharge status: Home / Self Care | Payer: Medicare Other | Attending: Emergency Medicine | Admitting: Emergency Medicine

## 2016-06-07 DIAGNOSIS — Z91040 Latex allergy status: Secondary | ICD-10-CM | POA: Diagnosis not present

## 2016-06-07 DIAGNOSIS — N390 Urinary tract infection, site not specified: Secondary | ICD-10-CM | POA: Diagnosis not present

## 2016-06-07 DIAGNOSIS — Z96642 Presence of left artificial hip joint: Secondary | ICD-10-CM | POA: Diagnosis not present

## 2016-06-07 DIAGNOSIS — Z87891 Personal history of nicotine dependence: Secondary | ICD-10-CM | POA: Insufficient documentation

## 2016-06-07 DIAGNOSIS — E039 Hypothyroidism, unspecified: Secondary | ICD-10-CM | POA: Insufficient documentation

## 2016-06-07 DIAGNOSIS — Z7982 Long term (current) use of aspirin: Secondary | ICD-10-CM | POA: Diagnosis not present

## 2016-06-07 DIAGNOSIS — I10 Essential (primary) hypertension: Secondary | ICD-10-CM | POA: Diagnosis not present

## 2016-06-07 DIAGNOSIS — R531 Weakness: Secondary | ICD-10-CM | POA: Diagnosis present

## 2016-06-07 MED ORDER — ACETAMINOPHEN 500 MG PO TABS
1000.0000 mg | ORAL_TABLET | Freq: Once | ORAL | Status: AC
  Administered 2016-06-08: 1000 mg via ORAL
  Filled 2016-06-07: qty 2

## 2016-06-07 NOTE — ED Triage Notes (Signed)
Patient BIB [facility] from [facility] - independent/assisted living side. C/O generalized weakens x1 day, unable to ambulate at baseline. Facility reports dark urine with odor. Patient regularly uses in/out cath's to urinate.

## 2016-06-07 NOTE — ED Provider Notes (Signed)
[name] DEPT Provider Note   CSN: [id number] Arrival date & time: 06/07/16  2252  By signing my name below, I, Jose Leach., attest that this documentation has been prepared under the direction and in the presence of Jose Balls, MD. Electronically signed: Jaquelyn Leach., ED Scribe. 06/08/16. 2:11 AM.   History   Chief Complaint Chief Complaint  Patient presents with  . Weakness  . Possible UTI    HPI  Jose Leach is a 83 y.o. male who [facility] the [facility] complaining of weakness with onset x2 days. Pt states that he has experienced increasing weakness for the past x2 days. He notes that prior to arrival, he had difficulty standing. Pt states that he is thirsty. Per wife, pt normally walks with the help of a walker but he could not stand for the past x1 day. Wife states that pt had a fever of 102 when EMS arrived which is abnormal of pt. Pt reports weakness, fever. He denies any modifying factors. Pt denies rhinorrhea, cough, sneezing. Of note, pt reports a hx of frequent UTIs and states that he must cath himself to urinate.   The history is provided by the patient and the spouse. No language interpreter was used.    Past Medical History:  Diagnosis Date  . Acute urinary retention 01/28/2013  . AKI (acute kidney injury) ([place]) 01/26/2013  . Altered mental status 09/09/2013  . Anemia    B12 deficiency  . ANXIETY 06/07/2007   Qualifier: Diagnosis of  By: Talbert Cage CMA ([place]), June    . ARTHRITIS 06/07/2007   Qualifier: Diagnosis of  By: Talbert Cage CMA ([place]), June    . Balance problems 04/05/2011  . Benign essential tremor   . Benign fibroma of prostate 10/11/2011  . Bilateral inguinal hernia 10/09/2012  . Bilateral leg edema 09/13/2012  . Bladder retention 02/05/2013  . Bradycardia, sinus 08/02/2012  . CAROTID ARTERY DISEASE 03/31/2007   Qualifier: Diagnosis of  By: Linna Darner MD, Gwyndolyn Saxon    . Carotid stenosis    s/p L CEA  .  Cataract, nuclear 03/21/2014  . 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 Cervical spinal stenosis   . Cervical stenosis of spine   . Critical lower limb ischemia   . Degeneration macular 11/02/2011  . Depression    Dr Albertine Patricia  . Difficulty in walking(719.7) 04/05/2011  . ESOPHAGITIS 08/06/2002   Qualifier: Diagnosis of  By: Talbert Cage CMA ([place]), June    . Essential and other specified forms of tremor 04/18/2013  . Falls   . Family history of colon cancer 07/24/2012  . Fecal impaction ([place]) 10/11/2012  . Femoral hernia, bilateral s/p lap repair 01/16/2013 01/16/2013  . Glaucoma, compensated 03/21/2014  . H/O cardiovascular stress test    a. 02/2003 -> no ischemia/infarct.  Marland Kitchen HAMMER TOE 04/23/2010   Qualifier: Diagnosis of  By: Linna Darner MD, Gwyndolyn Saxon    . Hematoma of leg   . Hip hematoma, right 09/09/2013  . History of shingles 2009  . HTN (hypertension) 08/22/2012  . Hx of echocardiogram    Echocardiogram 08/01/12: Mild LVH, EF 55-60%, normal wall motion.  . Hydrocele 10/11/2011  . Hyperlipidemia   . 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 Hypothyroidism   . Macular degeneration disease   . Macular edema   . Migraines    Dr Jannifer Franklin  . MORTON'S NEUROMA, LEFT 08/22/2007   Qualifier: Diagnosis of  By: Linna Darner MD, Gwyndolyn Saxon    . PAD (peripheral artery disease) ([place])   .  Peripheral neuropathy ([place])   . Personal history of colonic polyps 01/05/2013   2014 2 mm adenomatous polyp   . PREMATURE ATRIAL CONTRACTIONS 03/14/2006   Qualifier: Diagnosis of  By: Linna Darner MD, Gwendolyn Lima 12/16/2011  . SPINAL STENOSIS 03/14/2006   Qualifier: Diagnosis of  By: Linna Darner MD, William   Cervical spine    . Testosterone deficiency    Dr Lawerance Bach, [facility]  . Unspecified vitamin D deficiency 08/06/2012  . Urge incontinence 10/09/2012  . Urinary tract infection, site not specified 02/13/2013  . Weakness 08/02/2012    Patient Active Problem List   Diagnosis Date Noted  . URTI (infection of the upper respiratory tract)  02/10/2016  . Edema 12/23/2015  . Callus 07/23/2014  . Fall 07/09/2014  . Depression 07/09/2014  . Pain in left hip 05/22/2014  . AMD (age-related macular degeneration), wet ([place]) 03/21/2014  . Cataract, nuclear 03/21/2014  . Glaucoma, compensated 03/21/2014  . Status post intraocular lens implant 03/21/2014  . Benign prostatic hyperplasia with urinary obstruction 12/27/2013  . Conjunctivitis 10/02/2013  . Knee pain, right 09/25/2013  . Bilateral buttock pain 09/18/2013  . Other testicular hypofunction 09/14/2013  . Hip hematoma, right 09/09/2013  . Altered mental status 09/09/2013  . Benign essential tremor 04/18/2013  . 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 Bladder retention 02/05/2013  . Renal cyst, left 01/30/2013  . Femoral hernia, bilateral s/p lap repair 01/16/2013 01/16/2013  . Personal history of colonic polyps 01/05/2013  . Bilateral inguinal hernia 10/09/2012  . Urge incontinence 10/09/2012  . Bilateral leg edema 09/13/2012  . HTN (hypertension) 08/22/2012  . Unspecified vitamin D deficiency 08/06/2012  . Weakness 08/02/2012  . Bradycardia, sinus 08/02/2012  . Chronic constipation 07/24/2012  . Family history of colon cancer 07/24/2012  . Pseudoaphakia 12/16/2011  . Degeneration macular 11/02/2011  . Benign fibroma of prostate 10/11/2011  . Bladder neck obstruction 10/11/2011  . Hydrocele in adult 10/11/2011  . 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 Difficulty walking 04/05/2011  . Balance problems 04/05/2011  . Hammer toe 04/23/2010  . MORTON'S NEUROMA, LEFT 08/22/2007  . Anxiety state 06/07/2007  . ARTHRITIS 06/07/2007  . CAROTID ARTERY DISEASE 03/31/2007  . Peripheral vascular disease ([place]) 03/16/2007  . Hypothyroidism 11/10/2006  . HYPERLIPIDEMIA 11/10/2006  . MIGRAINE HEADACHE 03/14/2006  . PREMATURE ATRIAL CONTRACTIONS 03/14/2006  . Spinal stenosis of cervical region 03/14/2006  . ESOPHAGITIS 08/06/2002    Past Surgical History:  Procedure  Laterality Date  . APPENDECTOMY  1941  . BREAST CYST EXCISION Left 01/16/2013   Procedure: MASS EXCISION AXILLA;  Surgeon: Adin Hector, MD;  Location: [place];  Service: General;  Laterality: Left;  . CAROTID ENDARTERECTOMY  2005    L  . CATARACT EXTRACTION Right   . COLONOSCOPY  2014   Dr. Deatra Ina  . EYE SURGERY Right 02/2012   retina  . INGUINAL HERNIA REPAIR Bilateral 01/16/2013   Procedure: LAPAROSCOPIC BILATERAL INGUINAL DIRECT AND INDIRECT, FEMORAL, AND OBTURATOR HERNIAS;  Surgeon: Adin Hector, MD;  Location: [place];  Service: General;  Laterality: Bilateral;  . INSERTION OF MESH N/A 01/16/2013   Procedure: INSERTION OF MESH;  Surgeon: Adin Hector, MD;  Location: [place];  Service: General;  Laterality: N/A;  . LUMBAR LAMINECTOMY    . TONSILLECTOMY AND ADENOIDECTOMY    . TOTAL HIP ARTHROPLASTY  2005   L       Home Medications    Prior to Admission medications   Medication Sig Start  Date End Date Taking? Authorizing Provider  aspirin 81 MG tablet Take 81 mg by mouth every morning.    Yes Historical Provider, MD  atorvastatin (LIPITOR) 20 MG tablet Take one tablet by mouth once daily for cholesterol Patient taking differently: Take 20 mg by mouth daily.  03/02/16  Yes Estill Dooms, MD  B Complex-C (B-COMPLEX WITH VITAMIN C) tablet Take 1 tablet by mouth daily.   Yes Historical Provider, MD  bimatoprost (LUMIGAN) 0.01 % SOLN Place 1 drop into both eyes at bedtime.   Yes Historical Provider, MD  buPROPion (WELLBUTRIN XL) 150 MG 24 hr tablet Take 150 mg by mouth every morning. 05/22/16  Yes Historical Provider, MD  buPROPion (WELLBUTRIN XL) 300 MG 24 hr tablet Take 450 mg by mouth every morning.    Yes Historical Provider, MD  Cholecalciferol (VITAMIN D3) 1000 units CAPS Take 1,000 Units by mouth daily.   Yes Historical Provider, MD  clonazePAM (KLONOPIN) 0.5 MG tablet Take 0.25-0.5 mg by mouth at bedtime as needed for anxiety (sleep). Take one daily for anxiety  10/14/15  Yes  Historical Provider, MD  fluticasone (CUTIVATE) 0.05 % cream Apply 1 application topically 2 (two) times daily as needed (irritation).  05/15/16  Yes Historical Provider, MD  ketoconazole (NIZORAL) 2 % cream Apply 1 application topically 2 (two) times daily as needed for irritation.  05/15/16  Yes Historical Provider, MD  Javier Docker Oil 500 MG CAPS Take 500 mg by mouth every morning.    Yes Historical Provider, MD  levothyroxine (SYNTHROID) 50 MCG tablet Take 1/2 tablet by mouth on Sunday mornings and Take one tablet by mouth every other morning for thryoid. Patient taking differently: Take 25-50 mcg by mouth daily before breakfast. Take 1/2 tablet by mouth on Sunday mornings and Take one tablet by mouth every other morning for thryoid. 05/16/15  Yes Estill Dooms, MD  LINZESS 290 MCG CAPS capsule TAKE ONE CAPSULE BY MOUTH DAILY Patient taking differently: TAKE 290 MCG BY MOUTH DAILY 04/08/16  Yes Estill Dooms, MD  mirtazapine (REMERON) 30 MG tablet Take 60 mg by mouth at bedtime.  02/16/16  Yes Historical Provider, MD  Multiple Vitamins-Minerals (ICAPS PO) Take 1 tablet by mouth daily.   Yes Historical Provider, MD  polyethylene glycol (MIRALAX / GLYCOLAX) packet Take 17 g by mouth daily as needed for mild constipation.   Yes Historical Provider, MD  PSYLLIUM PO Take 1 capsule by mouth daily.   Yes Historical Provider, MD  sertraline (ZOLOFT) 100 MG tablet Take 200 mg by mouth every morning.    Yes Historical Provider, MD  tamsulosin (FLOMAX) 0.4 MG CAPS capsule TAKE 1 CAPSULE (0.4 MG TOTAL) BY MOUTH DAILY. 05/24/14  Yes Historical Provider, MD  topiramate (TOPAMAX) 25 MG tablet TAKE ONE TABLET BY MOUTH EVERY MORNING AND 2 TABLETS IN THE EVENING Patient taking differently: Take 25-50 mg by mouth 2 (two) times daily. TAKE ONE TABLET BY MOUTH EVERY MORNING AND 2 TABLETS IN THE EVENING 01/01/16  Yes Dennie Bible, NP  cephALEXin (KEFLEX) 500 MG capsule Take 1 capsule (500 mg total) by mouth 2 (two) times  daily. 06/08/16   Jose Balls, MD    Family History Family History  Problem Relation Age of Onset  . Stroke Mother 21  . Hypertension Mother   . Diabetes Mother   . Heart disease Mother   . Rectal cancer Father 73  . Heart disease Father     in 63s  . Cancer Father  colorectal  . Nephritis Sister   . Seizures Brother     died as child    Social History Social History  Substance Use Topics  . Smoking status: Former Smoker    Years: 10.00    Types: Pipe, Cigars    Quit date: 02/16/1979  . Smokeless tobacco: Never Used     Comment: Quit about age 87   . Alcohol use 2.4 oz/week    4 Standard drinks or equivalent per week     Comment:  occasional     Allergies   Latex and Tape   Review of Systems Review of Systems  Constitutional: Positive for fever.  HENT: Negative for rhinorrhea and sneezing.   Respiratory: Negative for cough.   Neurological: Positive for weakness.  All other systems reviewed and are negative.    Physical Exam Updated Vital Signs BP (!) 146/55 (BP Location: Left Arm)   Pulse 60   Temp 100.3 F (37.9 C) (Oral)   Resp 18   SpO2 97%   Physical Exam  Constitutional: He is oriented to person, place, and time. Vital signs are normal. He appears well-developed and well-nourished.  Non-toxic appearance. He does not appear ill. No distress.  Tactile fever  HENT:  Head: Normocephalic and atraumatic.  Nose: Nose normal.  Mouth/Throat: Oropharynx is clear and moist. No oropharyngeal exudate.  Eyes: Conjunctivae and EOM are normal. Pupils are equal, round, and reactive to light. No scleral icterus.  Neck: Normal range of motion. Neck supple. No tracheal deviation, no edema, no erythema and normal range of motion present. No thyroid mass and no thyromegaly present.  Cardiovascular: Regular rhythm, S1 normal, S2 normal, normal heart sounds, intact distal pulses and normal pulses.  Bradycardia present.  Exam reveals no gallop and no friction rub.   No  murmur heard. Pulmonary/Chest: Effort normal and breath sounds normal. No respiratory distress. He has no wheezes. He has no rhonchi. He has no rales.  Abdominal: Soft. Normal appearance and bowel sounds are normal. He exhibits no distension, no ascites and no mass. There is no hepatosplenomegaly. There is no tenderness. There is no rebound, no guarding and no CVA tenderness.  Musculoskeletal: Normal range of motion. He exhibits no tenderness.       Right lower leg: He exhibits edema.       Left lower leg: He exhibits edema.  Bilateral lower extremity edema  Lymphadenopathy:    He has no cervical adenopathy.  Neurological: He is alert and oriented to person, place, and time. He has normal strength. No cranial nerve deficit or sensory deficit.  Skin: Skin is warm, dry and intact. No petechiae and no rash noted. He is not diaphoretic. No erythema. No pallor.  Nursing note and vitals reviewed.    ED Treatments / Results   DIAGNOSTIC STUDIES: Oxygen Saturation is 99% on RA, normal by my interpretation.   COORDINATION OF CARE: 2:11 AM-Discussed next steps with pt. Pt verbalized understanding and is agreeable with the plan.    Labs (all labs ordered are listed, but only abnormal results are displayed) Labs Reviewed  URINALYSIS, ROUTINE W REFLEX MICROSCOPIC - Abnormal; Notable for the following:       Result Value   APPearance CLOUDY (*)    Hgb urine dipstick SMALL (*)    Ketones, ur 5 (*)    Protein, ur 30 (*)    Leukocytes, UA LARGE (*)    Bacteria, UA RARE (*)    All other components within normal limits  URINE CULTURE    EKG  EKG Interpretation None       Radiology Dg Chest 2 View  Result Date: 06/08/2016 CLINICAL DATA:  83 year old male with fever. EXAM: CHEST  2 VIEW COMPARISON:  Chest radiograph dated 02/04/2016 FINDINGS: Lungs are hyperexpanded but clear. A small nodular density superimposed over the left lung base correspond to the nodule seen on the prior  radiograph and likely represent nipple shadow. There is no pleural effusion or pneumothorax. The cardiac silhouette is within normal limits. No acute osseous pathology. IMPRESSION: No acute cardiopulmonary process. Electronically Signed   By: Anner Crete M.D.   On: 06/08/2016 00:45    Procedures Procedures (including critical care time)  Medications Ordered in ED Medications  ibuprofen (ADVIL,MOTRIN) tablet 600 mg (not administered)  cephALEXin (KEFLEX) capsule 500 mg (not administered)  acetaminophen (TYLENOL) tablet 1,000 mg (1,000 mg Oral Given 06/08/16 0053)     Initial Impression / Assessment and Plan / ED Course  I have reviewed the triage vital signs and the nursing notes.  Pertinent labs & imaging results that were available during my care of the patient were reviewed by me and considered in my medical decision making (see chart for details).     Patient presents to the ED for weakness and fever. Temp here taken by me is 102.6. He was given tylenol.  He does self cath so likely has UTI.  Will obtain CXR as well.    2:11 AM CXR is neg.  UA shows probable UTI. Will treat with keflex x 1 week. PCP fu advised within 3 days. He and family demonstrate good understanding of the plan. He appears well and in NAD.  Fever has resolved after tylenol and ibuprofen. Patient is safe for DC.    Final Clinical Impressions(s) / ED Diagnoses   Final diagnoses:  Lower urinary tract infectious disease    New Prescriptions New Prescriptions   CEPHALEXIN (KEFLEX) 500 MG CAPSULE    Take 1 capsule (500 mg total) by mouth 2 (two) times daily.   I personally performed the services described in this documentation, which was scribed in my presence. The recorded information has been reviewed and is accurate.       Jose Balls, MD 06/08/16 [phone]

## 2016-06-07 NOTE — ED Notes (Signed)
Bed: WA07 Expected date:  Expected time:  Means of arrival:  Comments: 83 yo M/ Fever

## 2016-06-08 ENCOUNTER — Emergency Department (HOSPITAL_COMMUNITY): Payer: Medicare Other

## 2016-06-08 ENCOUNTER — Encounter: Payer: Medicare Other | Admitting: Internal Medicine

## 2016-06-08 LAB — URINALYSIS, ROUTINE W REFLEX MICROSCOPIC
Bilirubin Urine: NEGATIVE
GLUCOSE, UA: NEGATIVE mg/dL
KETONES UR: 5 mg/dL — AB
NITRITE: NEGATIVE
PH: 8 (ref 5.0–8.0)
PROTEIN: 30 mg/dL — AB
Specific Gravity, Urine: 1.018 (ref 1.005–1.030)
Squamous Epithelial / LPF: NONE SEEN

## 2016-06-08 MED ORDER — CEPHALEXIN 500 MG PO CAPS: 500.0000 mg | ORAL_CAPSULE | Freq: Two times a day (BID) | ORAL | 0 refills | Status: DC

## 2016-06-08 MED ORDER — IBUPROFEN 200 MG PO TABS
600.0000 mg | ORAL_TABLET | Freq: Once | ORAL | Status: AC
  Administered 2016-06-08: 600 mg via ORAL
  Filled 2016-06-08: qty 3

## 2016-06-08 MED ORDER — CEPHALEXIN 500 MG PO CAPS
500.0000 mg | ORAL_CAPSULE | Freq: Once | ORAL | Status: AC
  Administered 2016-06-08: 500 mg via ORAL
  Filled 2016-06-08: qty 1

## 2016-06-09 ENCOUNTER — Ambulatory Visit (INDEPENDENT_AMBULATORY_CARE_PROVIDER_SITE_OTHER): Payer: Medicare Other | Admitting: Family Medicine

## 2016-06-09 VITALS — BP 129/67 | HR 55 | Temp 98.1°F | Resp 16

## 2016-06-09 DIAGNOSIS — R319 Hematuria, unspecified: Secondary | ICD-10-CM | POA: Diagnosis not present

## 2016-06-09 DIAGNOSIS — N390 Urinary tract infection, site not specified: Secondary | ICD-10-CM | POA: Diagnosis not present

## 2016-06-09 MED ORDER — CEPHALEXIN 500 MG PO CAPS: 500.0000 mg | ORAL_CAPSULE | Freq: Two times a day (BID) | ORAL | 0 refills | Status: DC

## 2016-06-09 NOTE — Patient Instructions (Addendum)
  Increase the Keflex to 3 times a day.    We are awaiting the sensitivities for the culture.  This will tell us if he is on the right antibiotic.   You should really notice a difference in the next 48 hours.  I think he is starting to get better especially since his weakness is improving.   If his weakness returns or he has continued fevers despite the antibiotics, he should return or go to the ED.   It was good to meet you today   IF you received an x-ray today, you will receive an invoice from [facility] Radiology. Please contact [facility] at [phone] with questions or concerns regarding your invoice.   IF you received labwork today, you will receive an invoice from [facility]. Please contact [facility] at [phone] with questions or concerns regarding your invoice.   Our billing staff will not be able to assist you with questions regarding bills from these companies.  You will be contacted with the lab results as soon as they are available. The fastest way to get your results is to activate your My Chart account. Instructions are located on the last page of this paperwork. If you have not heard from [place] regarding the results in 2 weeks, please contact this office.

## 2016-06-09 NOTE — Progress Notes (Signed)
Jose Leach is a 83 y.o. male who presents to Primary Care at [facility] today for FU from ED for UTI:    1.  UTI:  Patient recently diagnosed at [facility] ED with UTI after presenting with several days of increasing weakness.  Started on Keflex BID.  This was two days ago.  He has had gradual return of his strength.  Wife concerned that he had subjective fever this AM and brought him back to be examined.    Patient feels his strength is not quite to baseline but much improved since he had to go to the ED.  he normally ambulates with a walker. Prior to going to the emergency department he was not able to even do this. His is now again ambulating with a walker.  No chills. Patient himself does not notice any change in his temperature. No abdominal pain. No nausea vomiting. He is eating and drinking normally now. No cough. No URI symptoms. No wounds or other skin lesions.  No chest pain or dyspnea.  Patient self catheterizes once the morning and once the evening. He otherwise has normal urinary function during the day. His had no dysuria or urinary urgency or hesitancy since being seen in the emergency department. No constipation or diarrhea.  ROS as above.     PMH reviewed. Patient is a nonsmoker.    Past Surgical History:  Procedure Laterality Date  . APPENDECTOMY  1941  . BREAST CYST EXCISION Left 01/16/2013   Procedure: MASS EXCISION AXILLA;  Surgeon: Adin Hector, MD;  Location: [place];  Service: General;  Laterality: Left;  . CAROTID ENDARTERECTOMY  2005    L  . CATARACT EXTRACTION Right   . COLONOSCOPY  2014   Dr. Deatra Ina  . EYE SURGERY Right 02/2012   retina  . INGUINAL HERNIA REPAIR Bilateral 01/16/2013   Procedure: LAPAROSCOPIC BILATERAL INGUINAL DIRECT AND INDIRECT, FEMORAL, AND OBTURATOR HERNIAS;  Surgeon: Adin Hector, MD;  Location: [place];  Service: General;  Laterality: Bilateral;  . INSERTION OF MESH N/A 01/16/2013   Procedure: INSERTION OF MESH;  Surgeon: Adin Hector,  MD;  Location: [place];  Service: General;  Laterality: N/A;  . LUMBAR LAMINECTOMY    . TONSILLECTOMY AND ADENOIDECTOMY    . TOTAL HIP ARTHROPLASTY  2005   L    Medications reviewed.    Physical Exam:  BP 129/67 (BP Location: Right Arm, Patient Position: Sitting, Cuff Size: Normal)   Pulse (!) 55   Temp 98.1 F (36.7 C) (Oral)   Resp 16   SpO2 99%  Gen:  Alert, cooperative patient who appears stated age in no acute distress.  Vital signs reviewed.  Elderly and frail-appearing male in wheelchair. He was able to relate back to the examination room with a walker. Sallow appearance, chronically ill-appearing HEENT: EOMI,  MMM.  Some mild glossitis noted.  Pulm:  Clear to auscultation bilaterally with good air movement.  No wheezes or rales noted.   Cardiac:  Regular rate and rhythm  Abd:  Soft/nondistended/nontender.  Good bowel sounds throughout all four quadrants.  No masses noted.  Exts: +3 BL LE edema to mid-shins. Skin:  No rash or lesions noted  Assessment and Plan:  1.  UTI: - resolving.  - I have increased his Keflex to 3 times a day. -He had only subjective fevers and no documented temperature. -He otherwise says that he is doing much better than when he presented to the emergency department. -We  are still awaiting sensitivities on his culture. Cultures did grow 100,000 colonies of Proteus. Keflex should cover this. - Warning/return precautions provided.

## 2016-06-10 ENCOUNTER — Other Ambulatory Visit: Payer: Self-pay | Admitting: Internal Medicine

## 2016-06-10 ENCOUNTER — Telehealth: Payer: Self-pay | Admitting: Family Medicine

## 2016-06-10 LAB — URINE CULTURE: Culture: >=100000 — AB

## 2016-06-10 MED ORDER — CEPHALEXIN 500 MG PO CAPS: ORAL_CAPSULE | ORAL | 0 refills | Status: DC

## 2016-06-10 MED ORDER — CEPHALEXIN 500 MG PO CAPS
ORAL_CAPSULE | ORAL | 0 refills | Status: DC
Start: 2016-06-10 — End: 2016-06-10

## 2016-06-10 NOTE — Addendum Note (Signed)
Addended by: Alveda Reasons on: 06/10/2016 05:43 PM   Modules accepted: Orders

## 2016-06-10 NOTE — Telephone Encounter (Signed)
Called and spoke with patient.  There seemed to have been some miscommunication. The patient had not picked up the new prescription that I had sent in. I have sent in 7 pills. The patient has 9 pills remaining at home. That will cover him for a full weeks course at 3 times a day dosing.  Wife appreciated called. She states that he is feeling better today as well.  Follow up with his primary care provider within the next week to ensure he is continuing to improve.  The cultures came back Proteus sensitive to Keflex which he was on.

## 2016-06-10 NOTE — Telephone Encounter (Signed)
PT WIFE CALLED STATING THAT HER HUSBAND WILL RUN OUT OF MEDICINE TODAY DR Mingo Amber INCREASED HIS [place] TO 3 TIMES DAILY INSTEAD OF TWO PLEASE SEND TO [place]

## 2016-06-10 NOTE — Telephone Encounter (Signed)
Please see culture results.

## 2016-06-11 ENCOUNTER — Telehealth: Payer: Self-pay

## 2016-06-11 NOTE — Telephone Encounter (Signed)
Post ED Visit - Positive Culture Follow-up  Culture report reviewed by antimicrobial stewardship pharmacist:  []  Elenor Quinones, Pharm.D. []  Heide Guile, Pharm.D., BCPS AQ-ID []  Parks Ne[place]une, Pharm.D., BCPS []  Alycia Rossetti, Pharm.D., BCPS []  [place], Pharm.D., BCPS, AAHIVP []  Legrand Como, Pharm.D., BCPS, AAHIVP []  Salome Arnt, PharmD, BCPS []  Dimitri Ped, PharmD, BCPS []  Vincenza Hews, PharmD, BCPS  Positive urine culture Treated with Cephalexin, organism sensitive to the same and no further patient follow-up is required at this time.  Genia Del 06/11/2016, 9:11 AM

## 2016-06-22 ENCOUNTER — Encounter: Payer: Self-pay | Admitting: Internal Medicine

## 2016-06-22 ENCOUNTER — Non-Acute Institutional Stay: Payer: Medicare Other | Admitting: Internal Medicine

## 2016-06-22 VITALS — BP 124/72 | HR 60 | Temp 97.9°F | Ht 72.0 in | Wt 147.0 lb

## 2016-06-22 DIAGNOSIS — N3941 Urge incontinence: Secondary | ICD-10-CM | POA: Diagnosis not present

## 2016-06-22 DIAGNOSIS — R2689 Other abnormalities of gait and mobility: Secondary | ICD-10-CM | POA: Diagnosis not present

## 2016-06-22 DIAGNOSIS — R609 Edema, unspecified: Secondary | ICD-10-CM | POA: Diagnosis not present

## 2016-06-22 DIAGNOSIS — R739 Hyperglycemia, unspecified: Secondary | ICD-10-CM | POA: Diagnosis not present

## 2016-06-22 DIAGNOSIS — R946 Abnormal results of thyroid function studies: Secondary | ICD-10-CM

## 2016-06-22 DIAGNOSIS — I10 Essential (primary) hypertension: Secondary | ICD-10-CM

## 2016-06-22 DIAGNOSIS — E782 Mixed hyperlipidemia: Secondary | ICD-10-CM | POA: Diagnosis not present

## 2016-06-22 DIAGNOSIS — R7989 Other specified abnormal findings of blood chemistry: Secondary | ICD-10-CM | POA: Insufficient documentation

## 2016-06-22 DIAGNOSIS — Z9181 History of falling: Secondary | ICD-10-CM

## 2016-06-22 DIAGNOSIS — N182 Chronic kidney disease, stage 2 (mild): Secondary | ICD-10-CM | POA: Insufficient documentation

## 2016-06-22 NOTE — Progress Notes (Signed)
Facility  [facility]    Place of Service: [facility] (12)     Allergies  Allergen Reactions  . Latex Rash  . Tape Rash    Paper tape is ok to use     Chief Complaint  Patient presents with  . Medical Management of Chronic Issues    4 month medication management blood pressure, thyroid, anxiety.     HPI:  Essential hypertension - controlled  Balance problems - chronic. Make him fall. He is on  high doses of multiple antidepressants. He has regular visits with Dr. Albertine Patricia, psych.  Edema, unspecified type - stable 1+  HYPERLIPIDEMIA - normal in 2017  Urge incontinence - stable  History of fall - noninjurious about 2 weeks ago. No precipitating factors.     Medications: Patient's Medications  New Prescriptions   No medications on file  Previous Medications   ASPIRIN 81 MG TABLET    Take 81 mg by mouth every morning.    ATORVASTATIN (LIPITOR) 20 MG TABLET    Take one tablet by mouth once daily for cholesterol   B COMPLEX-C (B-COMPLEX WITH VITAMIN C) TABLET    Take 1 tablet by mouth daily.   BIMATOPROST (LUMIGAN) 0.01 % SOLN    Place 1 drop into both eyes at bedtime.   BUPROPION (WELLBUTRIN XL) 150 MG 24 HR TABLET    Take 150 mg by mouth every morning.   BUPROPION (WELLBUTRIN XL) 300 MG 24 HR TABLET    Take 450 mg by mouth every morning.    CHOLECALCIFEROL (VITAMIN D3) 1000 UNITS CAPS    Take 1,000 Units by mouth daily.   CLONAZEPAM (KLONOPIN) 0.5 MG TABLET    Take 0.25-0.5 mg by mouth at bedtime as needed for anxiety (sleep). Take one daily for anxiety    FLUTICASONE (CUTIVATE) 0.05 % CREAM    Apply 1 application topically 2 (two) times daily as needed (irritation).    KETOCONAZOLE (NIZORAL) 2 % CREAM    Apply 1 application topically 2 (two) times daily as needed for irritation.    KRILL OIL 500 MG CAPS    Take 500 mg by mouth every morning.    LEVOTHYROXINE (SYNTHROID) 50 MCG TABLET    Take 1/2 tablet by mouth on Sunday mornings and Take one tablet by mouth every other  morning for thryoid.   LINZESS 290 MCG CAPS CAPSULE    TAKE ONE CAPSULE BY MOUTH DAILY   MIRTAZAPINE (REMERON) 30 MG TABLET    Take 60 mg by mouth at bedtime.    MULTIPLE VITAMINS-MINERALS (ICAPS PO)    Take 1 tablet by mouth daily.   POLYETHYLENE GLYCOL (MIRALAX / GLYCOLAX) PACKET    Take 17 g by mouth daily as needed for mild constipation.   PSYLLIUM PO    Take 1 capsule by mouth daily.   SERTRALINE (ZOLOFT) 100 MG TABLET    Take 200 mg by mouth every morning.    TAMSULOSIN (FLOMAX) 0.4 MG CAPS CAPSULE    TAKE 1 CAPSULE (0.4 MG TOTAL) BY MOUTH DAILY.   TOPIRAMATE (TOPAMAX) 25 MG TABLET    TAKE ONE TABLET BY MOUTH EVERY MORNING AND 2 TABLETS IN THE EVENING  Modified Medications   No medications on file  Discontinued Medications   CEPHALEXIN (KEFLEX) 500 MG CAPSULE    Add to previous prescription to take 1 pill TID x 1 week     Review of Systems  Constitutional: Positive for activity change and fatigue. Negative for appetite change,  chills, diaphoresis, fever and unexpected weight change.       Complains of generalized weakness  HENT: Positive for hearing loss. Negative for sore throat, trouble swallowing and voice change.   Eyes: Positive for visual disturbance (corrective lenses).       History of wet macular degeneration  Respiratory: Positive for shortness of breath (On exertion).   Cardiovascular: Positive for leg swelling (1+ bipedal). Negative for chest pain and palpitations.  Gastrointestinal: Positive for constipation. Negative for abdominal distention, abdominal pain, nausea and vomiting.  Endocrine:       Hypothyroid. Testosterone deficiency.  Genitourinary: Positive for difficulty urinating, enuresis, frequency and urgency. Negative for discharge, dysuria, flank pain and hematuria.  Musculoskeletal: Positive for arthralgias, back pain and gait problem.       Unstable gait and hx falls. Knee pains, particularly in the right knee. Cervical spinal stenosis with off and on pains  in the neck.. Right lower leg edematous at 2+.  Skin: Negative.   Neurological: Positive for tremors, weakness and headaches. Negative for dizziness, seizures, syncope, facial asymmetry, light-headedness and numbness.       Recent altered mentation when ill. Mild dementia. History of cerebral atrophy and cerebrovascular disease. History of cervical spondylosis with spinal cord stenosis.  Hematological: Negative.   Psychiatric/Behavioral: Negative for agitation. The patient is nervous/anxious.        Hx depression    Vitals:   06/22/16 1139  BP: 124/72  Pulse: 60  Temp: 97.9 F (36.6 C)  TempSrc: Oral  SpO2: 97%  Weight: 147 lb (66.7 kg)  Height: 6' (1.829 m)   Wt Readings from Last 3 Encounters:  06/22/16 147 lb (66.7 kg)  05/13/16 154 lb (69.9 kg)  03/02/16 151 lb (68.5 kg)    Body mass index is 19.94 kg/m.  Physical Exam  Constitutional: He is oriented to person, place, and time.  Thin, frail  HENT:  Head: Normocephalic and atraumatic.  Hearing loss  Eyes: Conjunctivae and EOM are normal. Pupils are equal, round, and reactive to light.  Neck: No JVD present. No tracheal deviation present. No thyromegaly present.  Cardiovascular: Normal rate, regular rhythm and normal heart sounds.  Exam reveals no gallop and no friction rub.   No murmur heard. Pulmonary/Chest: No respiratory distress. He has no wheezes. He has no rales. He exhibits no tenderness.  Abdominal: He exhibits no distension and no mass. There is no tenderness.  Musculoskeletal: Normal range of motion. He exhibits edema (1+ bipedal). He exhibits no tenderness.  Lurching, unstable gait. Using four-wheel walker. Right knee pain.  Lymphadenopathy:    He has no cervical adenopathy.  Neurological: He is alert and oriented to person, place, and time. No cranial nerve deficit. Coordination abnormal.  Tremor bilaterally. Unstable gait.  Skin: No rash noted. No erythema. No pallor.  Each callus ball of right foot.  Hemorrhage under the callus.  Psychiatric: He has a normal mood and affect. His behavior is normal. Thought content normal.     Labs reviewed: Lab Summary Latest Ref Rng & Units 05/13/2016 02/02/2016 12/20/2015  Hemoglobin 13.0 - 17.0 g/dL 13.1 (None) 14.1  Hematocrit 39.0 - 52.0 % 40.2 (None) 44.1  White count 4.0 - 10.5 K/uL 4.7 (None) 6.7  Platelet count 150 - 400 K/uL 128(L) (None) 127(L)  Sodium 135 - 145 mmol/L 140 141 140  Potassium 3.5 - 5.1 mmol/L 4.1 4.4 4.2  Calcium 8.9 - 10.3 mg/dL 8.4(L) 9.4 8.9  Phosphorus - (None) (None) (None)  Creatinine 0.61 -  1.24 mg/dL 1.20 1.24(H) 1.14  AST 15 - 41 U/L '20 18 19  '$ Alk Phos 38 - 126 U/L 38 42 41  Bilirubin 0.3 - 1.2 mg/dL 0.4 0.5 0.5  Glucose 65 - 99 mg/dL 119(H) 84 89  Cholesterol <200 mg/dL (None) 160 (None)  HDL cholesterol >40 mg/dL (None) 49 (None)  Triglycerides <150 mg/dL (None) 78 (None)  LDL Direct - (None) (None) (None)  LDL Calc <100 mg/dL (None) 95 (None)  Total protein 6.5 - 8.1 g/dL 5.8(L) 7.1 6.5  Albumin 3.5 - 5.0 g/dL 3.2(L) 4.0 3.6  Some recent data might be hidden   Lab Results  Component Value Date   TSH 4.52 (H) 02/02/2016   Lab Results  Component Value Date   BUN 36 (H) 05/13/2016   BUN 37 (H) 02/02/2016   BUN 33 (H) 12/20/2015   Lab Results  Component Value Date   CREATININE 1.20 05/13/2016   CREATININE 1.24 (H) 02/02/2016   CREATININE 1.14 12/20/2015   Lab Results  Component Value Date   HGBA1C 5.3 07/31/2012   HGBA1C 5.4 03/16/2011   HGBA1C 5.4 02/26/2008       Assessment/Plan  1. Essential hypertension The current medical regimen is effective;  continue present plan and medications. - Comprehensive metabolic panel; Future  2. Balance problems I asked him to talk to Dr. Albertine Patricia abut his meds to see if there is a possibility that some ofhis psych meds could be reduced.  3. Edema, unspecified type unchanged  4. HYPERLIPIDEMIA controlled  5. Urge incontinence  6. History of  fall High risk for more falls  7. Hyperglycemia - Comprehensive metabolic panel; Future - Hemoglobin A1c; Future - Microalbumin, urine; Future  8. Elevated TSH - TSH; Future  9. CKD (chronic kidney disease) stage 2, GFR 60-89 ml/min -CMP

## 2016-06-24 ENCOUNTER — Ambulatory Visit (INDEPENDENT_AMBULATORY_CARE_PROVIDER_SITE_OTHER): Payer: Medicare Other | Admitting: Gastroenterology

## 2016-06-24 ENCOUNTER — Encounter: Payer: Self-pay | Admitting: Gastroenterology

## 2016-06-24 VITALS — BP 152/62 | HR 58 | Ht 72.0 in | Wt 145.0 lb

## 2016-06-24 DIAGNOSIS — K5900 Constipation, unspecified: Secondary | ICD-10-CM

## 2016-06-24 DIAGNOSIS — K5641 Fecal impaction: Secondary | ICD-10-CM

## 2016-06-24 MED ORDER — LINACLOTIDE 145 MCG PO CAPS: 145.0000 ug | ORAL_CAPSULE | Freq: Every day | ORAL | 3 refills | Status: DC

## 2016-06-24 NOTE — Patient Instructions (Signed)
We have sent the following medications to your pharmacy for you to pick up at your convenience:  LINZESS 145 DAILY  Miralax Daily  Stop Psyllium   Call back in 10 days and ask for Katina Degree, RN or Almyra Free, RN with an update.

## 2016-06-24 NOTE — Progress Notes (Signed)
06/24/2016 Jose Leach [id number] 05-01-1932   HISTORY OF PRESENT ILLNESS:  This is a pleasant 83 year old male who is previously known to Dr. Deatra Ina for chronic constipation and history of fecal impactions.  He has not been seen here since 2014.  He presents here today with the same complaints.  Is currently taking psyllium capsule daily, Linzess 290 mcg daily, and miralax as needed.  He says that the psyllium and Linzess 290 mcg does not seem to help but then when he takes Miralax as well for a few days in a row then he gets diarrhea.  He says that he actually did this earlier this week and then we had several BM's yesterday and already this AM.  No rectal bleeding or abdominal pain.  Last colonoscopy was 10/2012 at which time he was found to have one polyp that was removed and was a tubular adenoma.  Also had mild diverticulosis and an area in the rectal vault with friable mucosa that was thought to be due to a recent fecal impaction.   Past Medical History:  Diagnosis Date  . Acute urinary retention 01/28/2013  . AKI (acute kidney injury) ([place]) 01/26/2013  . Altered mental status 09/09/2013  . Anemia    B12 deficiency  . ANXIETY 06/07/2007   Qualifier: Diagnosis of  By: Talbert Cage CMA (Guadalupe Guerra), June    . ARTHRITIS 06/07/2007   Qualifier: Diagnosis of  By: Talbert Cage CMA ([place]), June    . Balance problems 04/05/2011  . Benign essential tremor   . Benign fibroma of prostate 10/11/2011  . Bilateral inguinal hernia 10/09/2012  . Bilateral leg edema 09/13/2012  . Bladder retention 02/05/2013  . Bradycardia, sinus 08/02/2012  . CAROTID ARTERY DISEASE 03/31/2007   Qualifier: Diagnosis of  By: Linna Darner MD, Gwyndolyn Saxon    . Carotid stenosis    s/p L CEA  . Cataract, nuclear 03/21/2014  . 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 Cervical spinal stenosis   . Cervical stenosis of spine   . Critical lower limb ischemia   . Degeneration macular 11/02/2011  . Depression    Dr Albertine Patricia  . Difficulty in  walking(719.7) 04/05/2011  . ESOPHAGITIS 08/06/2002   Qualifier: Diagnosis of  By: Talbert Cage CMA ([place]), June    . Essential and other specified forms of tremor 04/18/2013  . Falls   . Family history of colon cancer 07/24/2012  . Fecal impaction ([place]) 10/11/2012  . Femoral hernia, bilateral s/p lap repair 01/16/2013 01/16/2013  . Glaucoma, compensated 03/21/2014  . H/O cardiovascular stress test    a. 02/2003 -> no ischemia/infarct.  Marland Kitchen HAMMER TOE 04/23/2010   Qualifier: Diagnosis of  By: Linna Darner MD, Gwyndolyn Saxon    . Hematoma of leg   . Hip hematoma, right 09/09/2013  . History of shingles 2009  . HTN (hypertension) 08/22/2012  . Hx of echocardiogram    Echocardiogram 08/01/12: Mild LVH, EF 55-60%, normal wall motion.  . Hydrocele 10/11/2011  . Hyperlipidemia   . 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 Hypothyroidism   . Macular degeneration disease   . Macular edema   . Migraines    Dr Jannifer Franklin  . MORTON'S NEUROMA, LEFT 08/22/2007   Qualifier: Diagnosis of  By: Linna Darner MD, Gwyndolyn Saxon    . PAD (peripheral artery disease) ([place])   . Peripheral neuropathy   . Personal history of colonic polyps 01/05/2013   2014 2 mm adenomatous polyp   . PREMATURE ATRIAL CONTRACTIONS 03/14/2006   Qualifier: Diagnosis of  By: Linna Darner  MD, Gwyndolyn Saxon    . Alda Ponder 12/16/2011  . SPINAL STENOSIS 03/14/2006   Qualifier: Diagnosis of  By: Linna Darner MD, William   Cervical spine    . Testosterone deficiency    Dr Lawerance Bach, [facility]  . Unspecified vitamin D deficiency 08/06/2012  . Urge incontinence 10/09/2012  . Urinary tract infection, site not specified 02/13/2013  . Weakness 08/02/2012   Past Surgical History:  Procedure Laterality Date  . APPENDECTOMY  1941  . BREAST CYST EXCISION Left 01/16/2013   Procedure: MASS EXCISION AXILLA;  Surgeon: Adin Hector, MD;  Location: [facility];  Service: General;  Laterality: Left;  . CAROTID ENDARTERECTOMY  2005    L  . CATARACT EXTRACTION Right   . COLONOSCOPY  2014   Dr. Deatra Ina  . EYE SURGERY  Right 02/2012   retina  . INGUINAL HERNIA REPAIR Bilateral 01/16/2013   Procedure: LAPAROSCOPIC BILATERAL INGUINAL DIRECT AND INDIRECT, FEMORAL, AND OBTURATOR HERNIAS;  Surgeon: Adin Hector, MD;  Location: [facility];  Service: General;  Laterality: Bilateral;  . INSERTION OF MESH N/A 01/16/2013   Procedure: INSERTION OF MESH;  Surgeon: Adin Hector, MD;  Location: [facility];  Service: General;  Laterality: N/A;  . LUMBAR LAMINECTOMY    . TONSILLECTOMY AND ADENOIDECTOMY    . TOTAL HIP ARTHROPLASTY  2005   L    reports that he quit smoking about 37 years ago. His smoking use included Pipe and Cigars. He quit after 10.00 years of use. He has never used smokeless tobacco. He reports that he drinks about 2.4 oz of alcohol per week . He reports that he does not use drugs. family history includes Cancer in his father; Diabetes in his mother; Heart disease in his father and mother; Hypertension in his mother; Nephritis in his sister; Rectal cancer (age of onset: 54) in his father; Seizures in his brother; Stroke (age of onset: 19) in his mother. Allergies  Allergen Reactions  . Latex Rash  . Tape Rash    Paper tape is ok to use       Outpatient Encounter Prescriptions as of 06/24/2016  Medication Sig  . aspirin 81 MG tablet Take 81 mg by mouth every morning.   Marland Kitchen atorvastatin (LIPITOR) 20 MG tablet Take one tablet by mouth once daily for cholesterol  . B Complex-C (B-COMPLEX WITH VITAMIN C) tablet Take 1 tablet by mouth daily.  . bimatoprost (LUMIGAN) 0.01 % SOLN Place 1 drop into both eyes at bedtime.  Marland Kitchen buPROPion (WELLBUTRIN XL) 150 MG 24 hr tablet Take 150 mg by mouth every morning.  Marland Kitchen buPROPion (WELLBUTRIN XL) 300 MG 24 hr tablet Take 450 mg by mouth every morning.   . Cholecalciferol (VITAMIN D3) 1000 units CAPS Take 1,000 Units by mouth daily.  . clonazePAM (KLONOPIN) 0.5 MG tablet Take 0.25-0.5 mg by mouth at bedtime as needed for anxiety (sleep). Take one daily for anxiety   . fluticasone  (CUTIVATE) 0.05 % cream Apply 1 application topically 2 (two) times daily as needed (irritation).   Marland Kitchen ketoconazole (NIZORAL) 2 % cream Apply 1 application topically 2 (two) times daily as needed for irritation.   Javier Docker Oil 500 MG CAPS Take 500 mg by mouth every morning.   Marland Kitchen levothyroxine (SYNTHROID) 50 MCG tablet Take 1/2 tablet by mouth on Sunday mornings and Take one tablet by mouth every other morning for thryoid.  Marland Kitchen LINZESS 290 MCG CAPS capsule TAKE ONE CAPSULE BY MOUTH DAILY  . mirtazapine (REMERON) 30  MG tablet Take 60 mg by mouth at bedtime.   . Multiple Vitamins-Minerals (ICAPS PO) Take 1 tablet by mouth daily.  . polyethylene glycol (MIRALAX / GLYCOLAX) packet Take 17 g by mouth daily as needed for mild constipation.  . PSYLLIUM PO Take 1 capsule by mouth daily.  . sertraline (ZOLOFT) 100 MG tablet Take 200 mg by mouth every morning.   . tamsulosin (FLOMAX) 0.4 MG CAPS capsule TAKE 1 CAPSULE (0.4 MG TOTAL) BY MOUTH DAILY.  Marland Kitchen topiramate (TOPAMAX) 25 MG tablet TAKE ONE TABLET BY MOUTH EVERY MORNING AND 2 TABLETS IN THE EVENING   No facility-administered encounter medications on file as of 06/24/2016.      REVIEW OF SYSTEMS  : All other systems reviewed and negative except where noted in the History of Present Illness.   PHYSICAL EXAM: BP (!) 152/62 (BP Location: Right Arm, Patient Position: Sitting, Cuff Size: Normal)   Pulse (!) 58   Ht 6' (1.829 m)   Wt 145 lb (65.8 kg)   BMI 19.67 kg/m  General: Well developed white male in no acute distress Head: Normocephalic and atraumatic Eyes:  Sclerae anicteric, conjunctiva pink. Ears: Normal auditory acuity Lungs: Clear throughout to auscultation; no increased WOB Heart: Regular rate and rhythm Abdomen: Soft, non-distended. Normal bowel sounds.  Non-tender. Musculoskeletal: Symmetrical with no gross deformities  Skin: No lesions on visible extremities Extremities: No edema  Neurological: Alert oriented x 4, grossly  non-focal Psychological:  Alert and cooperative. Normal mood and affect  ASSESSMENT AND PLAN: -Chronic constipation with history of impaction:  I am going to have him discontinue his psyllium.  I am going to decrease the dose of his Linzess to 145 mcg daily and begin taking Miralax daily.  I would like him to call back in about 10 days with an update on his symptoms.  CC:  Estill Dooms, MD

## 2016-06-24 NOTE — Progress Notes (Signed)
Agree with assessment and plan as outlined. If no improvement and continues to have constipation despite aggressive regimen, he should have a rectal exam and evaluate for pelvic floor dyssynergia, which is rare in males but ensure no abnormality if symptoms persist.

## 2016-07-02 ENCOUNTER — Telehealth: Payer: Self-pay | Admitting: Gastroenterology

## 2016-07-02 NOTE — Telephone Encounter (Signed)
Patient is asking how much Miralax he can take to supplement the Linzess.  He is advised he can take it 1-3 times a day as needed if he does not have a BM with Linzess alone.  He will call back for any additional questions or concerns.

## 2016-07-06 ENCOUNTER — Other Ambulatory Visit: Payer: Self-pay | Admitting: Internal Medicine

## 2016-08-09 ENCOUNTER — Emergency Department (HOSPITAL_COMMUNITY): Payer: Medicare Other

## 2016-08-09 ENCOUNTER — Emergency Department (HOSPITAL_COMMUNITY)
Admission: EM | Admit: 2016-08-09 | Discharge: 2016-08-09 | Disposition: A | Discharge status: Home / Self Care | Payer: Medicare Other | Attending: Emergency Medicine | Admitting: Emergency Medicine

## 2016-08-09 ENCOUNTER — Encounter (HOSPITAL_COMMUNITY): Payer: Self-pay | Admitting: Emergency Medicine

## 2016-08-09 DIAGNOSIS — Z87891 Personal history of nicotine dependence: Secondary | ICD-10-CM | POA: Diagnosis not present

## 2016-08-09 DIAGNOSIS — W06XXXA Fall from bed, initial encounter: Secondary | ICD-10-CM | POA: Insufficient documentation

## 2016-08-09 DIAGNOSIS — E039 Hypothyroidism, unspecified: Secondary | ICD-10-CM | POA: Diagnosis not present

## 2016-08-09 DIAGNOSIS — Z91040 Latex allergy status: Secondary | ICD-10-CM | POA: Diagnosis not present

## 2016-08-09 DIAGNOSIS — Z79899 Other long term (current) drug therapy: Secondary | ICD-10-CM | POA: Insufficient documentation

## 2016-08-09 DIAGNOSIS — S7002XA Contusion of left hip, initial encounter: Secondary | ICD-10-CM | POA: Diagnosis not present

## 2016-08-09 DIAGNOSIS — Y999 Unspecified external cause status: Secondary | ICD-10-CM | POA: Diagnosis not present

## 2016-08-09 DIAGNOSIS — Y939 Activity, unspecified: Secondary | ICD-10-CM | POA: Diagnosis not present

## 2016-08-09 DIAGNOSIS — Z7982 Long term (current) use of aspirin: Secondary | ICD-10-CM | POA: Insufficient documentation

## 2016-08-09 DIAGNOSIS — N182 Chronic kidney disease, stage 2 (mild): Secondary | ICD-10-CM | POA: Diagnosis not present

## 2016-08-09 DIAGNOSIS — W19XXXA Unspecified fall, initial encounter: Secondary | ICD-10-CM

## 2016-08-09 DIAGNOSIS — I129 Hypertensive chronic kidney disease with stage 1 through stage 4 chronic kidney disease, or unspecified chronic kidney disease: Secondary | ICD-10-CM | POA: Insufficient documentation

## 2016-08-09 DIAGNOSIS — S79912A Unspecified injury of left hip, initial encounter: Secondary | ICD-10-CM | POA: Diagnosis present

## 2016-08-09 DIAGNOSIS — Y929 Unspecified place or not applicable: Secondary | ICD-10-CM | POA: Insufficient documentation

## 2016-08-09 MED ORDER — HYDROCODONE-ACETAMINOPHEN 5-325 MG PO TABS
1.0000 | ORAL_TABLET | Freq: Once | ORAL | Status: AC
  Administered 2016-08-09: 1 via ORAL
  Filled 2016-08-09: qty 1

## 2016-08-09 NOTE — ED Notes (Signed)
Bed: GB15 Expected date:  Expected time:  Means of arrival:  Comments: EMS 83 y/o fall, hip pain

## 2016-08-09 NOTE — ED Notes (Signed)
Patient tried to give a urine sample but it was not enough to test

## 2016-08-09 NOTE — ED Triage Notes (Signed)
Per EMS, pt is from independent living with his wife. Pt slid off the bed. Pt had a left hip replacement 20 years ago and is having some hip pain following the fall. Pt is AOx4.

## 2016-08-09 NOTE — ED Notes (Signed)
Urine sample request made,patient unable to provide one at this time

## 2016-08-09 NOTE — ED Provider Notes (Signed)
[facility] DEPT Provider Note   CSN: [id number] Arrival date & time: 08/09/16  0905     History   Chief Complaint Chief Complaint  Patient presents with  . Left Hip Pain    HPI Jose Leach is a 83 y.o. male.  Pt presents to the ED today with left hip pain.  The pt said he slid off his bed.  He landed on his left side.  He does have some left hip pain and some low back pain.  The pt has a hx of left hip replacement.  He said that he was able to walk, but it hurt.      Past Medical History:  Diagnosis Date  . Acute urinary retention 01/28/2013  . AKI (acute kidney injury) ([place]) 01/26/2013  . Altered mental status 09/09/2013  . Anemia    B12 deficiency  . ANXIETY 06/07/2007   Qualifier: Diagnosis of  By: Talbert Cage CMA ([place]), June    . ARTHRITIS 06/07/2007   Qualifier: Diagnosis of  By: Talbert Cage CMA ([place]), June    . Balance problems 04/05/2011  . Benign essential tremor   . Benign fibroma of prostate 10/11/2011  . Bilateral inguinal hernia 10/09/2012  . Bilateral leg edema 09/13/2012  . Bladder retention 02/05/2013  . Bradycardia, sinus 08/02/2012  . CAROTID ARTERY DISEASE 03/31/2007   Qualifier: Diagnosis of  By: Linna Darner MD, Gwyndolyn Saxon    . Carotid stenosis    s/p L CEA  . Cataract, nuclear 03/21/2014  . 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 Cervical spinal stenosis   . Cervical stenosis of spine   . Critical lower limb ischemia   . Degeneration macular 11/02/2011  . Depression    Dr Albertine Patricia  . Difficulty in walking(719.7) 04/05/2011  . ESOPHAGITIS 08/06/2002   Qualifier: Diagnosis of  By: Talbert Cage CMA ([place]), June    . Essential and other specified forms of tremor 04/18/2013  . Falls   . Family history of colon cancer 07/24/2012  . Fecal impaction ([place]) 10/11/2012  . Femoral hernia, bilateral s/p lap repair 01/16/2013 01/16/2013  . Glaucoma, compensated 03/21/2014  . H/O cardiovascular stress test    a. 02/2003 -> no ischemia/infarct.  Marland Kitchen HAMMER TOE 04/23/2010   Qualifier:  Diagnosis of  By: Linna Darner MD, Gwyndolyn Saxon    . Hematoma of leg   . Hip hematoma, right 09/09/2013  . History of shingles 2009  . HTN (hypertension) 08/22/2012  . Hx of echocardiogram    Echocardiogram 08/01/12: Mild LVH, EF 55-60%, normal wall motion.  . Hydrocele 10/11/2011  . Hyperlipidemia   . 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 Hypothyroidism   . Macular degeneration disease   . Macular edema   . Migraines    Dr Jannifer Franklin  . MORTON'S NEUROMA, LEFT 08/22/2007   Qualifier: Diagnosis of  By: Linna Darner MD, Gwyndolyn Saxon    . PAD (peripheral artery disease) ([place])   . Peripheral neuropathy   . Personal history of colonic polyps 01/05/2013   2014 2 mm adenomatous polyp   . PREMATURE ATRIAL CONTRACTIONS 03/14/2006   Qualifier: Diagnosis of  By: Linna Darner MD, Gwendolyn Lima 12/16/2011  . SPINAL STENOSIS 03/14/2006   Qualifier: Diagnosis of  By: Linna Darner MD, William   Cervical spine    . Testosterone deficiency    Dr Lawerance Bach, [facility]  . Unspecified vitamin D deficiency 08/06/2012  . Urge incontinence 10/09/2012  . Urinary tract infection, site not specified 02/13/2013  . Weakness 08/02/2012    Patient Active Problem List  Diagnosis Date Noted  . History of fall 06/22/2016  . Hyperglycemia 06/22/2016  . Elevated TSH 06/22/2016  . CKD (chronic kidney disease) stage 2, GFR 60-89 ml/min 06/22/2016  . Edema 12/23/2015  . Callus 07/23/2014  . Fall 07/09/2014  . Depression 07/09/2014  . Pain in left hip 05/22/2014  . AMD (age-related macular degeneration), wet ([place]) 03/21/2014  . Cataract, nuclear 03/21/2014  . Glaucoma, compensated 03/21/2014  . Status post intraocular lens implant 03/21/2014  . Benign prostatic hyperplasia with urinary obstruction 12/27/2013  . Knee pain, right 09/25/2013  . Bilateral buttock pain 09/18/2013  . Other testicular hypofunction 09/14/2013  . Hip hematoma, right 09/09/2013  . Altered mental status 09/09/2013  . Benign essential tremor 04/18/2013  .  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 Bladder retention 02/05/2013  . Renal cyst, left 01/30/2013  . Femoral hernia, bilateral s/p lap repair 01/16/2013 01/16/2013  . Personal history of colonic polyps 01/05/2013  . Fecal impaction ([place]) 10/11/2012  . Bilateral inguinal hernia 10/09/2012  . Urge incontinence 10/09/2012  . Bilateral leg edema 09/13/2012  . HTN (hypertension) 08/22/2012  . Unspecified vitamin D deficiency 08/06/2012  . Bradycardia, sinus 08/02/2012  . Constipation 07/24/2012  . Family history of colon cancer 07/24/2012  . Pseudoaphakia 12/16/2011  . Degeneration macular 11/02/2011  . Benign fibroma of prostate 10/11/2011  . Bladder neck obstruction 10/11/2011  . Hydrocele in adult 10/11/2011  . 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 Difficulty walking 04/05/2011  . Balance problems 04/05/2011  . Hammer toe 04/23/2010  . MORTON'S NEUROMA, LEFT 08/22/2007  . Anxiety state 06/07/2007  . ARTHRITIS 06/07/2007  . CAROTID ARTERY DISEASE 03/31/2007  . Peripheral vascular disease ([place]) 03/16/2007  . Hypothyroidism 11/10/2006  . HYPERLIPIDEMIA 11/10/2006  . MIGRAINE HEADACHE 03/14/2006  . PREMATURE ATRIAL CONTRACTIONS 03/14/2006  . Spinal stenosis of cervical region 03/14/2006  . ESOPHAGITIS 08/06/2002    Past Surgical History:  Procedure Laterality Date  . APPENDECTOMY  1941  . BREAST CYST EXCISION Left 01/16/2013   Procedure: MASS EXCISION AXILLA;  Surgeon: Adin Hector, MD;  Location: [facility];  Service: General;  Laterality: Left;  . CAROTID ENDARTERECTOMY  2005    L  . CATARACT EXTRACTION Right   . COLONOSCOPY  2014   Dr. Deatra Ina  . EYE SURGERY Right 02/2012   retina  . INGUINAL HERNIA REPAIR Bilateral 01/16/2013   Procedure: LAPAROSCOPIC BILATERAL INGUINAL DIRECT AND INDIRECT, FEMORAL, AND OBTURATOR HERNIAS;  Surgeon: Adin Hector, MD;  Location: [facility];  Service: General;  Laterality: Bilateral;  . INSERTION OF MESH N/A 01/16/2013   Procedure: INSERTION OF  MESH;  Surgeon: Adin Hector, MD;  Location: [facility];  Service: General;  Laterality: N/A;  . LUMBAR LAMINECTOMY    . TONSILLECTOMY AND ADENOIDECTOMY    . TOTAL HIP ARTHROPLASTY  2005   L       Home Medications    Prior to Admission medications   Medication Sig Start Date End Date Taking? Authorizing Provider  aspirin 81 MG tablet Take 81 mg by mouth every morning.     [provider]  atorvastatin (LIPITOR) 20 MG tablet Take one tablet by mouth once daily for cholesterol 03/02/16   Estill Dooms, MD  B Complex-C (B-COMPLEX WITH VITAMIN C) tablet Take 1 tablet by mouth daily.    [provider]  bimatoprost (LUMIGAN) 0.01 % SOLN Place 1 drop into both eyes at bedtime.    [provider]  buPROPion (WELLBUTRIN XL) 150 MG 24  hr tablet Take 150 mg by mouth every morning. 05/22/16   [provider]  buPROPion (WELLBUTRIN XL) 300 MG 24 hr tablet Take 450 mg by mouth every morning.     [provider]  Cholecalciferol (VITAMIN D3) 1000 units CAPS Take 1,000 Units by mouth daily.    [provider]  clonazePAM (KLONOPIN) 0.5 MG tablet Take 0.25-0.5 mg by mouth at bedtime as needed for anxiety (sleep). Take one daily for anxiety  10/14/15   [provider]  fluticasone (CUTIVATE) 0.05 % cream Apply 1 application topically 2 (two) times daily as needed (irritation).  05/15/16   [provider]  ketoconazole (NIZORAL) 2 % cream Apply 1 application topically 2 (two) times daily as needed for irritation.  05/15/16   [provider]  Javier Docker Oil 500 MG CAPS Take 500 mg by mouth every morning.     [provider]  levothyroxine (SYNTHROID) 50 MCG tablet Take 1/2 tablet by mouth on Sunday mornings and Take one tablet by mouth every other morning for thryoid. 05/16/15   Estill Dooms, MD  LINZESS 290 MCG CAPS capsule TAKE ONE CAPSULE BY MOUTH DAILY 07/06/16   Estill Dooms, MD  mirtazapine (REMERON) 30 MG tablet Take 60  mg by mouth at bedtime.  02/16/16   [provider]  Multiple Vitamins-Minerals (ICAPS PO) Take 1 tablet by mouth daily.    [provider]  polyethylene glycol (MIRALAX / GLYCOLAX) packet Take 17 g by mouth daily as needed for mild constipation.    [provider]  sertraline (ZOLOFT) 100 MG tablet Take 200 mg by mouth every morning.     [provider]  tamsulosin (FLOMAX) 0.4 MG CAPS capsule TAKE 1 CAPSULE (0.4 MG TOTAL) BY MOUTH DAILY. 05/24/14   [provider]  topiramate (TOPAMAX) 25 MG tablet TAKE ONE TABLET BY MOUTH EVERY MORNING AND 2 TABLETS IN THE EVENING 01/01/16   Dennie Bible, NP    Family History Family History  Problem Relation Age of Onset  . Stroke Mother 69  . Hypertension Mother   . Diabetes Mother   . Heart disease Mother   . Rectal cancer Father 44  . Heart disease Father        in 57s  . Cancer Father        colorectal  . Nephritis Sister   . Seizures Brother        died as child    Social History Social History  Substance Use Topics  . Smoking status: Former Smoker    Years: 10.00    Types: Pipe, Cigars    Quit date: 02/16/1979  . Smokeless tobacco: Never Used     Comment: Quit about age 37   . Alcohol use 2.4 oz/week    4 Standard drinks or equivalent per week     Comment:  occasional     Allergies   Latex and Tape   Review of Systems Review of Systems  Musculoskeletal: Positive for back pain.       Left hip pain  All other systems reviewed and are negative.    Physical Exam Updated Vital Signs BP 129/83   Pulse (!) 57   Temp 97.9 F (36.6 C) (Oral)   Resp 16   SpO2 97%   Physical Exam  Constitutional: He is oriented to person, place, and time. He appears well-developed and well-nourished.  HENT:  Head: Normocephalic and atraumatic.  Right Ear: External ear normal.  Left Ear: External ear normal.  Nose: Nose normal.  Mouth/Throat: Oropharynx is clear and moist.  Eyes:  Conjunctivae and EOM are normal. Pupils are equal, round, and reactive to light.  Neck: Normal range of motion. Neck supple.  Cardiovascular: Normal rate, regular rhythm, normal heart sounds and intact distal pulses.   Pulmonary/Chest: Effort normal and breath sounds normal.  Abdominal: Soft. Bowel sounds are normal.  Musculoskeletal:       Left hip: He exhibits tenderness.       Lumbar back: He exhibits tenderness.  Neurological: He is alert and oriented to person, place, and time. He displays tremor.  Skin: Skin is warm and dry.  Psychiatric: He has a normal mood and affect. His behavior is normal. Judgment and thought content normal.  Nursing note and vitals reviewed.    ED Treatments / Results  Labs (all labs ordered are listed, but only abnormal results are displayed) Labs Reviewed  URINALYSIS, ROUTINE W REFLEX MICROSCOPIC    EKG  EKG Interpretation None       Radiology Dg Lumbar Spine Complete  Result Date: 08/09/2016 CLINICAL DATA:  Left hip and mid low back pain. Multiple recent falls. EXAM: LUMBAR SPINE - COMPLETE 4+ VIEW COMPARISON:  MRI of the lumbar spine of April 11, 2005 FINDINGS: The lumbar vertebral bodies are preserved in height. There is moderate disc space narrowing at all lumbar levels with L1-2, L3-4, and L4-5 being most significantly involved. There is no spondylolisthesis. There small anterior endplate osteophytes at all lumbar levels. There is mild facet joint hypertrophy at L4-5 and at L5-S1. The pedicles and transverse processes are intact where visualized. The observed portions of the sacrum are normal. IMPRESSION: Multilevel moderate degenerative disc disease. Mild facet joint hypertrophy at L4-5 and L5-S1. No compression fracture or spondylolisthesis. Electronically Signed   By: David  Martinique M.D.   On: 08/09/2016 10:36   Dg Hip Unilat W Or Wo Pelvis 2-3 Views Left  Result Date: 08/09/2016 CLINICAL DATA:  Mid low back and left hip pain. Multiple  recent falls. EXAM: DG HIP (WITH OR WITHOUT PELVIS) 2-3V LEFT COMPARISON:  Pelvis and left hip x-ray of March 25, 2003 FINDINGS: The bones are subjectively osteopenic. There is a prosthetic left hip joint. The interface with the native bone appears normal. No native bone fracture is observed. On the right there is severe osteoarthritic change with a bone on bone appearance superiorly and subarticular cystic change of the femoral head. No acute right hip fracture is observed. The bony pelvis is intact. IMPRESSION: There is no acute fracture or dislocation of the pelvis or of the native right hip or prosthetic left hip. There is severe degenerative change of the right hip joint. Electronically Signed   By: David  Martinique M.D.   On: 08/09/2016 10:34    Procedures Procedures (including critical care time)  Medications Ordered in ED Medications  HYDROcodone-acetaminophen (NORCO/VICODIN) 5-325 MG per tablet 1 tablet (1 tablet Oral Given 08/09/16 0955)     Initial Impression / Assessment and Plan / ED Course  I have reviewed the triage vital signs and the nursing notes.  Pertinent labs & imaging results that were available during my care of the patient were reviewed by me and considered in my medical decision making (see chart for details).    No fracture on Xray. Pt able to ambulate with walker.  He is stable for d/c.  Final Clinical Impressions(s) / ED Diagnoses   Final diagnoses:  Fall, initial encounter  Contusion of left hip, initial encounter    New Prescriptions New Prescriptions   No medications on file     Isla Pence, MD 08/09/16 1232

## 2016-08-09 NOTE — ED Triage Notes (Signed)
Pt reports he has bourne weight on hip since his last fall. No obvious deformity.

## 2016-08-09 NOTE — ED Notes (Signed)
Pt ambulated with tech using walker for assistance. Pt ambulated out to nurse's station and back to bed.

## 2016-08-16 ENCOUNTER — Encounter: Payer: Medicare Other | Admitting: Internal Medicine

## 2016-08-17 ENCOUNTER — Encounter: Payer: Medicare Other | Admitting: Family

## 2016-09-01 ENCOUNTER — Encounter: Payer: Self-pay | Admitting: Internal Medicine

## 2016-09-01 ENCOUNTER — Non-Acute Institutional Stay: Payer: Medicare Other | Admitting: Internal Medicine

## 2016-09-01 VITALS — BP 140/80 | HR 60 | Temp 97.3°F | Resp 16 | Ht 72.0 in | Wt 143.0 lb

## 2016-09-01 DIAGNOSIS — K5900 Constipation, unspecified: Secondary | ICD-10-CM | POA: Diagnosis not present

## 2016-09-01 DIAGNOSIS — W19XXXD Unspecified fall, subsequent encounter: Secondary | ICD-10-CM | POA: Diagnosis not present

## 2016-09-01 DIAGNOSIS — E782 Mixed hyperlipidemia: Secondary | ICD-10-CM

## 2016-09-01 DIAGNOSIS — E039 Hypothyroidism, unspecified: Secondary | ICD-10-CM

## 2016-09-01 DIAGNOSIS — N182 Chronic kidney disease, stage 2 (mild): Secondary | ICD-10-CM | POA: Diagnosis not present

## 2016-09-01 DIAGNOSIS — R2681 Unsteadiness on feet: Secondary | ICD-10-CM

## 2016-09-01 MED ORDER — LINACLOTIDE 145 MCG PO CAPS: 145.0000 ug | ORAL_CAPSULE | Freq: Every day | ORAL | 3 refills | Status: DC

## 2016-09-01 NOTE — Progress Notes (Signed)
[facility]  Provider: [name] MD   Location:  [place] [facility] Service:  Clinic (12)  PCP: [name], MD Patient Care Team: [name], MD as PCP - General (Internal Medicine) [name], MD as Consulting Physician (Urology) [name], MD as Consulting Physician (Neurology) [name], MD as Consulting Physician (Dermatology) [name], MD as Consulting Physician (Gastroenterology) [name], MD as Consulting Physician (Orthopedic Surgery) [name], MD as Consulting Physician (Cardiology) [name], MD as Consulting Physician (Ophthalmology) [name], MD as Referring Physician (Psychiatry) [name], MD as Consulting Physician (General Surgery) [name], MD as Consulting Physician (Orthopedic Surgery)  Extended Emergency Contact Information Primary Emergency Contact: [facility] Address: [address]          [place], [place] [address] Johnnette Litter of [place] Phone: [phone] Relation: Spouse   Goals of Care: Advanced Directive information Advanced Directives 08/09/2016  Does Patient Have a Medical Advance Directive? Yes  Type of Paramedic of [place];Living will  Does patient want to make changes to medical advance directive? -  Copy of [place] in Chart? No - copy requested  Pre-existing out of facility DNR order (yellow form or pink MOST form) -     Chief Complaint  Patient presents with  . Acute Visit    fall, ED follow up    HPI: Patient is a 83 y.o. male seen today for acute visit. He had a fall last week. He lost his balance and fell on the floor of his bedroom and hit his left side. Denies hitting his head. He hit his left shoulder, arm and hip. Denies any loss of consciousness. Denies any open wound. Denies chest pain, presyncope, dizziness, SOB or abdominal cramp preceding the fall. He is a high  fall risk and has had alteast 3 falls in last 6 months. He was seen in the ED for this fall. Fracture of lumbar spine, hip and pelvis were ruled out. He denies any pain to left hip, back, shoulder or arm from fall today.   Past Medical History:  Diagnosis Date  . Acute urinary retention 01/28/2013  . AKI (acute kidney injury) ([place]) 01/26/2013  . Altered mental status 09/09/2013  . Anemia    B12 deficiency  . ANXIETY 06/07/2007   Qualifier: Diagnosis of  By: Talbert Cage CMA ([place]), June    . ARTHRITIS 06/07/2007   Qualifier: Diagnosis of  By: Talbert Cage CMA ([place]), June    . Balance problems 04/05/2011  . Benign essential tremor   . Benign fibroma of prostate 10/11/2011  . Bilateral inguinal hernia 10/09/2012  . Bilateral leg edema 09/13/2012  . Bladder retention 02/05/2013  . Bradycardia, sinus 08/02/2012  . CAROTID ARTERY DISEASE 03/31/2007   Qualifier: Diagnosis of  By: Linna Darner MD, Gwyndolyn Saxon    . Carotid stenosis    s/p L CEA  . Cataract, nuclear 03/21/2014  . 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 Cervical spinal stenosis   . Cervical stenosis of spine   . Critical lower limb ischemia   . Degeneration macular 11/02/2011  . Depression    Dr Albertine Patricia  . Difficulty in walking(719.7) 04/05/2011  . ESOPHAGITIS 08/06/2002   Qualifier: Diagnosis of  By: Talbert Cage CMA ([place]), June    . Essential and other specified forms of tremor 04/18/2013  . Falls   . Family history of colon cancer 07/24/2012  . Fecal impaction ([place])  10/11/2012  . Femoral hernia, bilateral s/p lap repair 01/16/2013 01/16/2013  . Glaucoma, compensated 03/21/2014  . H/O cardiovascular stress test    a. 02/2003 -> no ischemia/infarct.  Marland Kitchen HAMMER TOE 04/23/2010   Qualifier: Diagnosis of  By: Linna Darner MD, Gwyndolyn Saxon    . Hematoma of leg   . Hip hematoma, right 09/09/2013  . History of shingles 2009  . HTN (hypertension) 08/22/2012  . Hx of echocardiogram    Echocardiogram 08/01/12: Mild LVH, EF 55-60%, normal wall motion.  . Hydrocele 10/11/2011  .  Hyperlipidemia   . 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 Hypothyroidism   . Macular degeneration disease   . Macular edema   . Migraines    Dr Jannifer Franklin  . MORTON'S NEUROMA, LEFT 08/22/2007   Qualifier: Diagnosis of  By: Linna Darner MD, Gwyndolyn Saxon    . PAD (peripheral artery disease) ([place])   . Peripheral neuropathy   . Personal history of colonic polyps 01/05/2013   2014 2 mm adenomatous polyp   . PREMATURE ATRIAL CONTRACTIONS 03/14/2006   Qualifier: Diagnosis of  By: Linna Darner MD, Gwendolyn Lima 12/16/2011  . SPINAL STENOSIS 03/14/2006   Qualifier: Diagnosis of  By: Linna Darner MD, William   Cervical spine    . Testosterone deficiency    Dr Lawerance Bach, [facility]  . Unspecified vitamin D deficiency 08/06/2012  . Urge incontinence 10/09/2012  . Urinary tract infection, site not specified 02/13/2013  . Weakness 08/02/2012   Past Surgical History:  Procedure Laterality Date  . APPENDECTOMY  1941  . BREAST CYST EXCISION Left 01/16/2013   Procedure: MASS EXCISION AXILLA;  Surgeon: Adin Hector, MD;  Location: [facility];  Service: General;  Laterality: Left;  . CAROTID ENDARTERECTOMY  2005    L  . CATARACT EXTRACTION Right   . COLONOSCOPY  2014   Dr. Deatra Ina  . EYE SURGERY Right 02/2012   retina  . INGUINAL HERNIA REPAIR Bilateral 01/16/2013   Procedure: LAPAROSCOPIC BILATERAL INGUINAL DIRECT AND INDIRECT, FEMORAL, AND OBTURATOR HERNIAS;  Surgeon: Adin Hector, MD;  Location: [facility];  Service: General;  Laterality: Bilateral;  . INSERTION OF MESH N/A 01/16/2013   Procedure: INSERTION OF MESH;  Surgeon: Adin Hector, MD;  Location: [facility];  Service: General;  Laterality: N/A;  . LUMBAR LAMINECTOMY    . TONSILLECTOMY AND ADENOIDECTOMY    . TOTAL HIP ARTHROPLASTY  2005   L    reports that he quit smoking about 37 years ago. His smoking use included Pipe and Cigars. He quit after 10.00 years of use. He has never used smokeless tobacco. He reports that he drinks about 2.4 oz of alcohol per week . He  reports that he does not use drugs. Social History   Social History  . Marital status: Married    Spouse name: [name]  . Number of children: 2  . Years of education: N/A   Occupational History  . retired  Retired   Social History Main Topics  . Smoking status: Former Smoker    Years: 10.00    Types: Pipe, Cigars    Quit date: 02/16/1979  . Smokeless tobacco: Never Used     Comment: Quit about age 44   . Alcohol use 2.4 oz/week    4 Standard drinks or equivalent per week     Comment:  occasional  . Drug use: No  . Sexual activity: Not on file   Other Topics Concern  . Not on file   Social  History Narrative   Lives at [facility] since 2013   Married - [name]   Former smoker -stopped 1981   Alcohol -wine occasionally    Exercise - walking, Ne-step    POA, Living Will   [employer] with walker     Family History  Problem Relation Age of Onset  . Stroke Mother 54  . Hypertension Mother   . Diabetes Mother   . Heart disease Mother   . Rectal cancer Father 1  . Heart disease Father        in 4s  . Cancer Father        colorectal  . Nephritis Sister   . Seizures Brother        died as child    Health Maintenance  Topic Date Due  . TETANUS/TDAP  02/15/2018 (Originally 02/15/1951)  . INFLUENZA VACCINE  09/15/2016  . PNA vac Low Risk Adult  Completed    Allergies  Allergen Reactions  . Latex Rash  . Tape Rash    Paper tape is ok to use     Outpatient Encounter Prescriptions as of 09/01/2016  Medication Sig  . aspirin 81 MG tablet Take 81 mg by mouth every morning.   Marland Kitchen atorvastatin (LIPITOR) 20 MG tablet Take one tablet by mouth once daily for cholesterol  . B Complex-C (B-COMPLEX WITH VITAMIN C) tablet Take 1 tablet by mouth daily.  . bimatoprost (LUMIGAN) 0.01 % SOLN Place 1 drop into both eyes at bedtime.  Marland Kitchen buPROPion (WELLBUTRIN XL) 150 MG 24 hr tablet Take 150 mg by mouth every morning.  Marland Kitchen buPROPion (WELLBUTRIN XL) 300 MG 24 hr tablet Take 450 mg by  mouth every morning.   . Cholecalciferol (VITAMIN D3) 1000 units CAPS Take 1,000 Units by mouth daily.  . clonazePAM (KLONOPIN) 0.5 MG tablet Take 0.25-0.5 mg by mouth at bedtime as needed for anxiety (sleep). Take one daily for anxiety   . fluticasone (CUTIVATE) 0.05 % cream Apply 1 application topically 2 (two) times daily as needed (irritation).   Marland Kitchen ketoconazole (NIZORAL) 2 % cream Apply 1 application topically 2 (two) times daily as needed for irritation.   Javier Docker Oil 500 MG CAPS Take 500 mg by mouth every morning.   Marland Kitchen levothyroxine (SYNTHROID) 50 MCG tablet Take 1/2 tablet by mouth on Sunday mornings and Take one tablet by mouth every other morning for thryoid.  . mirtazapine (REMERON) 30 MG tablet Take 30 mg by mouth at bedtime.   . Multiple Vitamins-Minerals (ICAPS PO) Take 1 tablet by mouth daily.  . polyethylene glycol (MIRALAX / GLYCOLAX) packet Take 17 g by mouth daily as needed for mild constipation.  . sertraline (ZOLOFT) 100 MG tablet Take 200 mg by mouth every morning.   . tamsulosin (FLOMAX) 0.4 MG CAPS capsule TAKE 1 CAPSULE (0.4 MG TOTAL) BY MOUTH DAILY.  Marland Kitchen topiramate (TOPAMAX) 25 MG tablet TAKE ONE TABLET BY MOUTH EVERY MORNING AND 2 TABLETS IN THE EVENING  . [DISCONTINUED] LINZESS 290 MCG CAPS capsule TAKE ONE CAPSULE BY MOUTH DAILY  . linaclotide (LINZESS) 145 MCG CAPS capsule Take 1 capsule (145 mcg total) by mouth daily before breakfast.   No facility-administered encounter medications on file as of 09/01/2016.     Review of Systems  Constitutional: Negative for appetite change, chills and fever.  HENT: Negative for congestion, ear pain, sore throat and trouble swallowing.   Eyes: Positive for visual disturbance.  Respiratory: Negative for cough and shortness of breath.  Cardiovascular: Negative for chest pain, palpitations and leg swelling.  Gastrointestinal: Positive for constipation. Negative for abdominal pain, blood in stool, diarrhea, nausea and vomiting.    Genitourinary: Negative for dysuria and hematuria.  Musculoskeletal: Positive for arthralgias, back pain and gait problem.       Has history of spinal stenosis, unsteady gait, uses walker with break and seat  Neurological: Positive for tremors. Negative for dizziness, seizures, weakness, numbness and headaches.       Has essential tremors  Hematological: Bruises/bleeds easily.  Psychiatric/Behavioral: Negative for behavioral problems. The patient is not nervous/anxious.     Vitals:   09/01/16 1014  BP: 140/80  Pulse: 60  Resp: 16  Temp: (!) 97.3 F (36.3 C)  TempSrc: Oral  SpO2: 99%  Weight: 143 lb (64.9 kg)  Height: 6' (1.829 m)   Body mass index is 19.39 kg/m. Physical Exam  Constitutional:  Elderly male, thin built, in no distress  HENT:  Head: Normocephalic and atraumatic.  Mouth/Throat: Oropharynx is clear and moist.  Eyes: Pupils are equal, round, and reactive to light. Conjunctivae are normal.  Neck: Normal range of motion. Neck supple. No thyromegaly present.  Cardiovascular: Normal rate and regular rhythm.   Pulmonary/Chest: Effort normal and breath sounds normal.  Abdominal: Soft. Bowel sounds are normal. There is no tenderness. There is no guarding.  Musculoskeletal: He exhibits edema.  Can move all 4 extremities, has scoliosis, uses a walker  Lymphadenopathy:    He has no cervical adenopathy.  Neurological: He is alert.  Tremors present  Skin: Skin is warm and dry.  Bruise to left hand  Psychiatric: He has a normal mood and affect.    Labs reviewed: Basic Metabolic Panel:  Recent Labs  12/20/15 1740 02/02/16 0830 05/13/16 2021  NA 140 141 140  K 4.2 4.4 4.1  CL 111 108 109  CO2 '22 23 24  '$ GLUCOSE 89 84 119*  BUN 33* 37* 36*  CREATININE 1.14 1.24* 1.20  CALCIUM 8.9 9.4 8.4*   Liver Function Tests:  Recent Labs  12/20/15 1740 02/02/16 0830 05/13/16 2021  AST '19 18 20  '$ ALT 17 15 15*  ALKPHOS 41 42 38  BILITOT 0.5 0.5 0.4  PROT 6.5 7.1  5.8*  ALBUMIN 3.6 4.0 3.2*   No results for input(s): LIPASE, AMYLASE in the last 8760 hours. No results for input(s): AMMONIA in the last 8760 hours. CBC:  Recent Labs  12/20/15 1740 05/13/16 2021  WBC 6.7 4.7  NEUTROABS 5.1 3.3  HGB 14.1 13.1  HCT 44.1 40.2  MCV 94.6 92.6  PLT 127* 128*   Cardiac Enzymes: No results for input(s): CKTOTAL, CKMB, CKMBINDEX, TROPONINI in the last 8760 hours. BNP: Invalid input(s): POCBNP Lab Results  Component Value Date   HGBA1C 5.3 07/31/2012   Lab Results  Component Value Date   TSH 4.52 (H) 02/02/2016   Lab Results  Component Value Date   [id number] 329 08/02/2012   No results found for: FOLATE No results found for: IRON, TIBC, FERRITIN  Lipid Panel:  Recent Labs  02/02/16 0830  CHOL 160  HDL 49  LDLCALC 95  TRIG 78  CHOLHDL 3.3   Lab Results  Component Value Date   HGBA1C 5.3 07/31/2012    Procedures since last visit: Dg Lumbar Spine Complete  Result Date: 08/09/2016 CLINICAL DATA:  Left hip and mid low back pain. Multiple recent falls. EXAM: LUMBAR SPINE - COMPLETE 4+ VIEW COMPARISON:  MRI of the lumbar spine of April 11, 2005 FINDINGS: The lumbar vertebral bodies are preserved in height. There is moderate disc space narrowing at all lumbar levels with L1-2, L3-4, and L4-5 being most significantly involved. There is no spondylolisthesis. There small anterior endplate osteophytes at all lumbar levels. There is mild facet joint hypertrophy at L4-5 and at L5-S1. The pedicles and transverse processes are intact where visualized. The observed portions of the sacrum are normal. IMPRESSION: Multilevel moderate degenerative disc disease. Mild facet joint hypertrophy at L4-5 and L5-S1. No compression fracture or spondylolisthesis. Electronically Signed   By: David  Martinique M.D.   On: 08/09/2016 10:36   Dg Hip Unilat W Or Wo Pelvis 2-3 Views Left  Result Date: 08/09/2016 CLINICAL DATA:  Mid low back and left hip pain. Multiple  recent falls. EXAM: DG HIP (WITH OR WITHOUT PELVIS) 2-3V LEFT COMPARISON:  Pelvis and left hip x-ray of March 25, 2003 FINDINGS: The bones are subjectively osteopenic. There is a prosthetic left hip joint. The interface with the native bone appears normal. No native bone fracture is observed. On the right there is severe osteoarthritic change with a bone on bone appearance superiorly and subarticular cystic change of the femoral head. No acute right hip fracture is observed. The bony pelvis is intact. IMPRESSION: There is no acute fracture or dislocation of the pelvis or of the native right hip or prosthetic left hip. There is severe degenerative change of the right hip joint. Electronically Signed   By: David  Martinique M.D.   On: 08/09/2016 10:34    Assessment/Plan  1. Fall, subsequent encounter Had a fall few days back in his room ,mechanical fall, fracture has been ruled out, denies pain, will need to be evaluated by PT and OT for gait training exercises, safe transfers to help prevent fall.  - CMP with eGFR; Future - CBC (no diff); Future  2. Constipation, unspecified constipation type With impaction problem, reviewed gi note, decrease linzess to 145 mcg daily, new script sent to pharmacy, continue miralax daily as needed  3. Hypothyroidism, unspecified type No recent tsh, continue levothyroxine - CBC (no diff); Future - Lipid Panel; Future - TSH; Future  4. CKD (chronic kidney disease) stage 2, GFR 60-89 ml/min Check renal function - CMP with eGFR; Future  5. HYPERLIPIDEMIA Continue atorvastatin - Lipid Panel; Future  6. Unsteady gait Will have him work with physical therapy and occupational therapy team to help with gait training and muscle strengthening exercises.fall precautions. To use his walker with transfer.    Labs/tests ordered:  As above  Next appointment: 3 months or earlier if needed  Communication: reviewed care plan with patient    [name], MD Internal  Medicine [place], [place] [address] Cell Phone (Monday-Friday 8 am - 5 pm): [phone] On Call: [phone] and follow prompts after 5 pm and on weekends Office Phone: [phone] Office Fax: [phone]

## 2016-10-01 ENCOUNTER — Emergency Department (HOSPITAL_BASED_OUTPATIENT_CLINIC_OR_DEPARTMENT_OTHER)
Admission: EM | Admit: 2016-10-01 | Discharge: 2016-10-01 | Disposition: A | Discharge status: Home / Self Care | Payer: Medicare Other | Attending: Emergency Medicine | Admitting: Emergency Medicine

## 2016-10-01 ENCOUNTER — Encounter (HOSPITAL_BASED_OUTPATIENT_CLINIC_OR_DEPARTMENT_OTHER): Payer: Self-pay | Admitting: *Deleted

## 2016-10-01 ENCOUNTER — Emergency Department (HOSPITAL_BASED_OUTPATIENT_CLINIC_OR_DEPARTMENT_OTHER): Payer: Medicare Other

## 2016-10-01 DIAGNOSIS — Z87891 Personal history of nicotine dependence: Secondary | ICD-10-CM | POA: Diagnosis not present

## 2016-10-01 DIAGNOSIS — M79671 Pain in right foot: Secondary | ICD-10-CM

## 2016-10-01 DIAGNOSIS — Z7982 Long term (current) use of aspirin: Secondary | ICD-10-CM | POA: Diagnosis not present

## 2016-10-01 DIAGNOSIS — I129 Hypertensive chronic kidney disease with stage 1 through stage 4 chronic kidney disease, or unspecified chronic kidney disease: Secondary | ICD-10-CM | POA: Diagnosis not present

## 2016-10-01 DIAGNOSIS — N182 Chronic kidney disease, stage 2 (mild): Secondary | ICD-10-CM | POA: Insufficient documentation

## 2016-10-01 DIAGNOSIS — M79672 Pain in left foot: Secondary | ICD-10-CM

## 2016-10-01 DIAGNOSIS — M25571 Pain in right ankle and joints of right foot: Secondary | ICD-10-CM | POA: Insufficient documentation

## 2016-10-01 DIAGNOSIS — E039 Hypothyroidism, unspecified: Secondary | ICD-10-CM | POA: Diagnosis not present

## 2016-10-01 DIAGNOSIS — M25572 Pain in left ankle and joints of left foot: Secondary | ICD-10-CM | POA: Diagnosis present

## 2016-10-01 DIAGNOSIS — Z79899 Other long term (current) drug therapy: Secondary | ICD-10-CM | POA: Insufficient documentation

## 2016-10-01 MED ORDER — ACETAMINOPHEN 325 MG PO TABS
650.0000 mg | ORAL_TABLET | Freq: Once | ORAL | Status: AC
  Administered 2016-10-01: 650 mg via ORAL
  Filled 2016-10-01: qty 2

## 2016-10-01 NOTE — ED Triage Notes (Signed)
Pt c/o right foot pain x 1 week

## 2016-10-01 NOTE — ED Provider Notes (Signed)
[facility] DEPT [facility] Provider Note   CSN: [id number] Arrival date & time: 10/01/16  1401     History   Chief Complaint Chief Complaint  Patient presents with  . Foot Pain    HPI Jose Leach is a 83 y.o. male.Complains of bilateral foot pain. Patient has had pain at left foot for several months from Hammer toe. He also reports that he hit his right or getting out of the shower 4 days ago complains of right foot pain since the event. Nothing makes pain better or worse. He has taken ibuprofen with partial relief. No other associated symptoms  HPI  Past Medical History:  Diagnosis Date  . Acute urinary retention 01/28/2013  . AKI (acute kidney injury) ([place]) 01/26/2013  . Altered mental status 09/09/2013  . Anemia    B12 deficiency  . ANXIETY 06/07/2007   Qualifier: Diagnosis of  By: Talbert Cage CMA ([place]), June    . ARTHRITIS 06/07/2007   Qualifier: Diagnosis of  By: Talbert Cage CMA ([place]), June    . Balance problems 04/05/2011  . Benign essential tremor   . Benign fibroma of prostate 10/11/2011  . Bilateral inguinal hernia 10/09/2012  . Bilateral leg edema 09/13/2012  . Bladder retention 02/05/2013  . Bradycardia, sinus 08/02/2012  . CAROTID ARTERY DISEASE 03/31/2007   Qualifier: Diagnosis of  By: Linna Darner MD, Gwyndolyn Saxon    . Carotid stenosis    s/p L CEA  . Cataract, nuclear 03/21/2014  . 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 Cervical spinal stenosis   . Cervical stenosis of spine   . Critical lower limb ischemia   . Degeneration macular 11/02/2011  . Depression    Dr Albertine Patricia  . Difficulty in walking(719.7) 04/05/2011  . ESOPHAGITIS 08/06/2002   Qualifier: Diagnosis of  By: Talbert Cage CMA ([place]), June    . Essential and other specified forms of tremor 04/18/2013  . Falls   . Family history of colon cancer 07/24/2012  . Fecal impaction ([place]) 10/11/2012  . Femoral hernia, bilateral s/p lap repair 01/16/2013 01/16/2013  . Glaucoma, compensated 03/21/2014  . H/O cardiovascular stress test     a. 02/2003 -> no ischemia/infarct.  Marland Kitchen HAMMER TOE 04/23/2010   Qualifier: Diagnosis of  By: Linna Darner MD, Gwyndolyn Saxon    . Hematoma of leg   . Hip hematoma, right 09/09/2013  . History of shingles 2009  . HTN (hypertension) 08/22/2012  . Hx of echocardiogram    Echocardiogram 08/01/12: Mild LVH, EF 55-60%, normal wall motion.  . Hydrocele 10/11/2011  . Hyperlipidemia   . 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 Hypothyroidism   . Macular degeneration disease   . Macular edema   . Migraines    Dr Jannifer Franklin  . MORTON'S NEUROMA, LEFT 08/22/2007   Qualifier: Diagnosis of  By: Linna Darner MD, Gwyndolyn Saxon    . PAD (peripheral artery disease) ([place])   . Peripheral neuropathy   . Personal history of colonic polyps 01/05/2013   2014 2 mm adenomatous polyp   . PREMATURE ATRIAL CONTRACTIONS 03/14/2006   Qualifier: Diagnosis of  By: Linna Darner MD, Gwendolyn Lima 12/16/2011  . SPINAL STENOSIS 03/14/2006   Qualifier: Diagnosis of  By: Linna Darner MD, William   Cervical spine    . Testosterone deficiency    Dr Lawerance Bach, [facility]  . Unspecified vitamin D deficiency 08/06/2012  . Urge incontinence 10/09/2012  . Urinary tract infection, site not specified 02/13/2013  . Weakness 08/02/2012    Patient Active Problem List   Diagnosis Date Noted  .  History of fall 06/22/2016  . Hyperglycemia 06/22/2016  . Elevated TSH 06/22/2016  . CKD (chronic kidney disease) stage 2, GFR 60-89 ml/min 06/22/2016  . Edema 12/23/2015  . Callus 07/23/2014  . Fall 07/09/2014  . Depression 07/09/2014  . Pain in left hip 05/22/2014  . AMD (age-related macular degeneration), wet ([place]) 03/21/2014  . Cataract, nuclear 03/21/2014  . Glaucoma, compensated 03/21/2014  . Status post intraocular lens implant 03/21/2014  . Benign prostatic hyperplasia with urinary obstruction 12/27/2013  . Knee pain, right 09/25/2013  . Bilateral buttock pain 09/18/2013  . Other testicular hypofunction 09/14/2013  . Hip hematoma, right 09/09/2013  . Altered mental  status 09/09/2013  . Benign essential tremor 04/18/2013  . 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 Bladder retention 02/05/2013  . Renal cyst, left 01/30/2013  . Femoral hernia, bilateral s/p lap repair 01/16/2013 01/16/2013  . Personal history of colonic polyps 01/05/2013  . Fecal impaction ([place]) 10/11/2012  . Bilateral inguinal hernia 10/09/2012  . Urge incontinence 10/09/2012  . Bilateral leg edema 09/13/2012  . HTN (hypertension) 08/22/2012  . Unspecified vitamin D deficiency 08/06/2012  . Bradycardia, sinus 08/02/2012  . Constipation 07/24/2012  . Family history of colon cancer 07/24/2012  . Pseudoaphakia 12/16/2011  . Degeneration macular 11/02/2011  . Benign fibroma of prostate 10/11/2011  . Bladder neck obstruction 10/11/2011  . Hydrocele in adult 10/11/2011  . 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 Difficulty walking 04/05/2011  . Balance problems 04/05/2011  . Hammer toe 04/23/2010  . MORTON'S NEUROMA, LEFT 08/22/2007  . Anxiety state 06/07/2007  . ARTHRITIS 06/07/2007  . CAROTID ARTERY DISEASE 03/31/2007  . Peripheral vascular disease ([place]) 03/16/2007  . Hypothyroidism 11/10/2006  . HYPERLIPIDEMIA 11/10/2006  . MIGRAINE HEADACHE 03/14/2006  . PREMATURE ATRIAL CONTRACTIONS 03/14/2006  . Spinal stenosis of cervical region 03/14/2006  . ESOPHAGITIS 08/06/2002    Past Surgical History:  Procedure Laterality Date  . APPENDECTOMY  1941  . BREAST CYST EXCISION Left 01/16/2013   Procedure: MASS EXCISION AXILLA;  Surgeon: Adin Hector, MD;  Location: [facility];  Service: General;  Laterality: Left;  . CAROTID ENDARTERECTOMY  2005    L  . CATARACT EXTRACTION Right   . COLONOSCOPY  2014   Dr. Deatra Ina  . EYE SURGERY Right 02/2012   retina  . INGUINAL HERNIA REPAIR Bilateral 01/16/2013   Procedure: LAPAROSCOPIC BILATERAL INGUINAL DIRECT AND INDIRECT, FEMORAL, AND OBTURATOR HERNIAS;  Surgeon: Adin Hector, MD;  Location: [facility];  Service: General;  Laterality: Bilateral;  .  INSERTION OF MESH N/A 01/16/2013   Procedure: INSERTION OF MESH;  Surgeon: Adin Hector, MD;  Location: [facility];  Service: General;  Laterality: N/A;  . LUMBAR LAMINECTOMY    . TONSILLECTOMY AND ADENOIDECTOMY    . TOTAL HIP ARTHROPLASTY  2005   L       Home Medications    Prior to Admission medications   Medication Sig Start Date End Date Taking? Authorizing Provider  aspirin 81 MG tablet Take 81 mg by mouth every morning.     [provider]  atorvastatin (LIPITOR) 20 MG tablet Take one tablet by mouth once daily for cholesterol 03/02/16   Estill Dooms, MD  B Complex-C (B-COMPLEX WITH VITAMIN C) tablet Take 1 tablet by mouth daily.    [provider]  bimatoprost (LUMIGAN) 0.01 % SOLN Place 1 drop into both eyes at bedtime.    [provider]  buPROPion (WELLBUTRIN XL) 150 MG 24 hr tablet Take 150 mg  by mouth every morning. 05/22/16   [provider]  buPROPion (WELLBUTRIN XL) 300 MG 24 hr tablet Take 450 mg by mouth every morning.     [provider]  Cholecalciferol (VITAMIN D3) 1000 units CAPS Take 1,000 Units by mouth daily.    [provider]  clonazePAM (KLONOPIN) 0.5 MG tablet Take 0.25-0.5 mg by mouth at bedtime as needed for anxiety (sleep). Take one daily for anxiety  10/14/15   [provider]  fluticasone (CUTIVATE) 0.05 % cream Apply 1 application topically 2 (two) times daily as needed (irritation).  05/15/16   [provider]  ketoconazole (NIZORAL) 2 % cream Apply 1 application topically 2 (two) times daily as needed for irritation.  05/15/16   [provider]  Javier Docker Oil 500 MG CAPS Take 500 mg by mouth every morning.     [provider]  levothyroxine (SYNTHROID) 50 MCG tablet Take 1/2 tablet by mouth on Sunday mornings and Take one tablet by mouth every other morning for thryoid. 05/16/15   Estill Dooms, MD  linaclotide Rolan Lipa) 145 MCG CAPS capsule Take 1 capsule (145 mcg total) by  mouth daily before breakfast. 09/01/16   [name], MD  mirtazapine (REMERON) 30 MG tablet Take 30 mg by mouth at bedtime.  02/16/16   [provider]  Multiple Vitamins-Minerals (ICAPS PO) Take 1 tablet by mouth daily.    [provider]  polyethylene glycol (MIRALAX / GLYCOLAX) packet Take 17 g by mouth daily as needed for mild constipation.    [provider]  sertraline (ZOLOFT) 100 MG tablet Take 200 mg by mouth every morning.     [provider]  tamsulosin (FLOMAX) 0.4 MG CAPS capsule TAKE 1 CAPSULE (0.4 MG TOTAL) BY MOUTH DAILY. 05/24/14   [provider]  topiramate (TOPAMAX) 25 MG tablet TAKE ONE TABLET BY MOUTH EVERY MORNING AND 2 TABLETS IN THE EVENING 01/01/16   Dennie Bible, NP    Family History Family History  Problem Relation Age of Onset  . Stroke Mother 22  . Hypertension Mother   . Diabetes Mother   . Heart disease Mother   . Rectal cancer Father 68  . Heart disease Father        in 2s  . Cancer Father        colorectal  . Nephritis Sister   . Seizures Brother        died as child    Social History Social History  Substance Use Topics  . Smoking status: Former Smoker    Years: 10.00    Types: Pipe, Cigars    Quit date: 02/16/1979  . Smokeless tobacco: Never Used     Comment: Quit about age 43   . Alcohol use 2.4 oz/week    4 Standard drinks or equivalent per week     Comment:  occasional     Allergies   Latex and Tape   Review of Systems Review of Systems  Musculoskeletal: Positive for gait problem.       Walks with walker also has wheelchair  Skin: Positive for wound.       Scabbed lesion on right foot. Patient does not know how long its been  Allergic/Immunologic:        current on DT Immunization  Neurological: Positive for numbness.       Chronic numbness in bilateral feet  All other systems reviewed and are negative.    Physical Exam Updated Vital Signs  BP 128/61   Pulse (!) 50    Temp 98.1 F (36.7 C)   Resp 18   Ht 6' (1.829 m)   Wt 65.3 kg (144 lb)   SpO2 100%   BMI 19.53 kg/m   Physical Exam  Constitutional:  Chronically ill-appearing frail  HENT:  Head: Normocephalic and atraumatic.  Eyes: Pupils are equal, round, and reactive to light. Conjunctivae are normal.  Neck: Neck supple. No tracheal deviation present. No thyromegaly present.  Cardiovascular: Regular rhythm.   No murmur heard. Bradycardic  Pulmonary/Chest: Effort normal and breath sounds normal.  Abdominal: Soft. Bowel sounds are normal. He exhibits no distension. There is no tenderness.  Musculoskeletal: Normal range of motion. He exhibits no edema or tenderness.  DP and PT pulses obtained bilaterally with Doppler. Femoral pulses 2+ bilaterally  Neurological: He is alert. Coordination normal.  Skin: Skin is warm and dry. No rash noted.  Bilateral lower extremities skin is thin. Mildly reddened at dorsum of bilateral feet. DP and PT pulses absent. Skin is normal temperature bilaterally. Good capillary refill. Right lower extremity there is a 65 m scabbed lesion at the distal aspect of the foot immediately proximal to the great toe, medial aspect there are calluses on multiple toes. Femoral pulses 2+ bilaterally. Bilateral upper extremities are redness or tenderness neurovascularly intact  Psychiatric: He has a normal mood and affect.  Nursing note and vitals reviewed.  Loose scabbed lesion was trimmed withsterile scissors by me on his right foot.  ED Treatments / Results  Labs (all labs ordered are listed, but only abnormal results are displayed) Labs Reviewed - No data to display  EKG  EKG Interpretation None       Radiology No results found.  Procedures Procedures (including critical care time)  Medications Ordered in ED Medications  acetaminophen (TYLENOL) tablet 650 mg (not administered)    X-rays viewed by me. Patient comfortable after treatment with Tylenol. Results  for orders placed or performed during the hospital encounter of 06/07/16  Urine culture  Result Value Ref Range   Specimen Description URINE, CLEAN CATCH    Special Requests NONE    Culture >=100,000 COLONIES/mL PROTEUS MIRABILIS (A)    Report Status 06/10/2016 FINAL    Organism ID, Bacteria PROTEUS MIRABILIS (A)       Susceptibility   Proteus mirabilis - MIC*    AMPICILLIN <=2 SENSITIVE Sensitive     CEFAZOLIN <=4 SENSITIVE Sensitive     CEFTRIAXONE <=1 SENSITIVE Sensitive     CIPROFLOXACIN <=0.25 SENSITIVE Sensitive     GENTAMICIN <=1 SENSITIVE Sensitive     IMIPENEM 2 SENSITIVE Sensitive     NITROFURANTOIN 128 RESISTANT Resistant     TRIMETH/SULFA <=20 SENSITIVE Sensitive     AMPICILLIN/SULBACTAM <=2 SENSITIVE Sensitive     PIP/TAZO <=4 SENSITIVE Sensitive     * >=100,000 COLONIES/mL PROTEUS MIRABILIS  Urinalysis, Routine w reflex microscopic- may I&O cath if menses  Result Value Ref Range   Color, Urine YELLOW YELLOW   APPearance CLOUDY (A) CLEAR   Specific Gravity, Urine 1.018 1.005 - 1.030   pH 8.0 5.0 - 8.0   Glucose, UA NEGATIVE NEGATIVE mg/dL   Hgb urine dipstick SMALL (A) NEGATIVE   Bilirubin Urine NEGATIVE NEGATIVE   Ketones, ur 5 (A) NEGATIVE mg/dL   Protein, ur 30 (A) NEGATIVE mg/dL   Nitrite NEGATIVE NEGATIVE   Leukocytes, UA LARGE (A) NEGATIVE   RBC / HPF 0-5 0 - 5 RBC/hpf   WBC, UA TOO  NUMEROUS TO COUNT 0 - 5 WBC/hpf   Bacteria, UA RARE (A) NONE SEEN   Squamous Epithelial / LPF NONE SEEN NONE SEEN   Mucous PRESENT    Amorphous Crystal PRESENT    Dg Foot Complete Left  Result Date: 10/01/2016 CLINICAL DATA:  83 year old male with bilateral foot pain after fall. EXAM: LEFT FOOT - COMPLETE 3+ VIEW; RIGHT FOOT COMPLETE - 3+ VIEW COMPARISON:  None. FINDINGS: There is no evidence of fracture or dislocation. There are mild degenerative changes without other focal bone abnormality. Soft tissue irregularity is noted at the base of the right forefoot. Soft tissues on  the left are unremarkable. IMPRESSION: 1. Soft tissue irregularity along the plantar aspect of the right forefoot without underlying osseous abnormality. 2. Mild degenerative changes bilaterally. Electronically Signed   By: Kristopher Oppenheim M.D.   On: 10/01/2016 15:44   Dg Foot Complete Right  Result Date: 10/01/2016 CLINICAL DATA:  83 year old male with bilateral foot pain after fall. EXAM: LEFT FOOT - COMPLETE 3+ VIEW; RIGHT FOOT COMPLETE - 3+ VIEW COMPARISON:  None. FINDINGS: There is no evidence of fracture or dislocation. There are mild degenerative changes without other focal bone abnormality. Soft tissue irregularity is noted at the base of the right forefoot. Soft tissues on the left are unremarkable. IMPRESSION: 1. Soft tissue irregularity along the plantar aspect of the right forefoot without underlying osseous abnormality. 2. Mild degenerative changes bilaterally. Electronically Signed   By: Kristopher Oppenheim M.D.   On: 10/01/2016 15:44    Initial Impression / Assessment and Plan / ED Course  I have reviewed the triage vital signs and the nursing notes.  Pertinent labs & imaging results that were available during my care of the patient were reviewed by me and considered in my medical decision making (see chart for details). Patient clinically with long-standing peripheral vascular disease given 10 skin, lack of hair on his legs. May be contributing to chronic foot pain as well as his arthritis. Plan continue Tylenol. Referral Dr.Brabham from vascular surgery and [facility]      Final Clinical Impressions(s) / ED Diagnoses  Diagnosis bilateral foot pain Final diagnoses:  None    New Prescriptions New Prescriptions   No medications on file     Orlie Dakin, MD 10/01/16 [phone]

## 2016-10-01 NOTE — Discharge Instructions (Signed)
Take Tylenol as directed for pain. Call Dr. Stephens Shire office to schedule an appointment regarding the circulation in your feet. You should also see a podiatrist. Call the [facility]. Office located at [facility]. [address], [place]. Office phone number is [phone]

## 2016-10-07 ENCOUNTER — Ambulatory Visit (INDEPENDENT_AMBULATORY_CARE_PROVIDER_SITE_OTHER): Payer: Medicare Other | Admitting: Neurology

## 2016-10-07 ENCOUNTER — Encounter: Payer: Self-pay | Admitting: Neurology

## 2016-10-07 VITALS — BP 165/83 | HR 60 | Ht 72.0 in | Wt 147.5 lb

## 2016-10-07 DIAGNOSIS — R2689 Other abnormalities of gait and mobility: Secondary | ICD-10-CM | POA: Diagnosis not present

## 2016-10-07 DIAGNOSIS — M4802 Spinal stenosis, cervical region: Secondary | ICD-10-CM

## 2016-10-07 DIAGNOSIS — G250 Essential tremor: Secondary | ICD-10-CM | POA: Diagnosis not present

## 2016-10-07 MED ORDER — ALPRAZOLAM 0.5 MG PO TABS: ORAL_TABLET | ORAL | 0 refills | Status: DC

## 2016-10-07 NOTE — Progress Notes (Signed)
Reason for visit: Essential tremor  Jose Leach is an 83 y.o. male  History of present illness:  Jose Leach is an 83 year old right-handed white male with a history of an essential tremor and a significant gait disorder. The patient has been treated with Topamax for the tremor with modest benefit. The patient walks with a walker, he has had falls intermittently, he went to the emergency room following a fall on 08/09/2016. He was seen on 10/01/2016 with bilateral foot pain, he has been sent to a vascular surgeon to evaluate him for possible peripheral vascular disease. The patient has known severe cervical spinal stenosis at the C3-4 level documented in 2013, the patient was sent for a neurosurgical evaluation, but surgery was never done. The patient has had some decline in his ability to ambulate, he reports urinary incontinence. He returns this office for an evaluation. The patient has a significant anxiety disorder, he is followed through psychiatry.    Past Medical History:  Diagnosis Date  . Acute urinary retention 01/28/2013  . AKI (acute kidney injury) ([place]) 01/26/2013  . Altered mental status 09/09/2013  . Anemia    B12 deficiency  . ANXIETY 06/07/2007   Qualifier: Diagnosis of  By: Talbert Cage CMA ([place]), June    . ARTHRITIS 06/07/2007   Qualifier: Diagnosis of  By: Talbert Cage CMA ([place]), June    . Balance problems 04/05/2011  . Benign essential tremor   . Benign fibroma of prostate 10/11/2011  . Bilateral inguinal hernia 10/09/2012  . Bilateral leg edema 09/13/2012  . Bladder retention 02/05/2013  . Bradycardia, sinus 08/02/2012  . CAROTID ARTERY DISEASE 03/31/2007   Qualifier: Diagnosis of  By: Linna Darner MD, Gwyndolyn Saxon    . Carotid stenosis    s/p L CEA  . Cataract, nuclear 03/21/2014  . 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 Cervical spinal stenosis   . Cervical stenosis of spine   . Critical lower limb ischemia   . Degeneration macular 11/02/2011  . Depression    Dr Albertine Patricia    . Difficulty in walking(719.7) 04/05/2011  . ESOPHAGITIS 08/06/2002   Qualifier: Diagnosis of  By: Talbert Cage CMA ([place]), June    . Essential and other specified forms of tremor 04/18/2013  . Falls   . Family history of colon cancer 07/24/2012  . Fecal impaction ([place]) 10/11/2012  . Femoral hernia, bilateral s/p lap repair 01/16/2013 01/16/2013  . Glaucoma, compensated 03/21/2014  . H/O cardiovascular stress test    a. 02/2003 -> no ischemia/infarct.  Marland Kitchen HAMMER TOE 04/23/2010   Qualifier: Diagnosis of  By: Linna Darner MD, Gwyndolyn Saxon    . Hematoma of leg   . Hip hematoma, right 09/09/2013  . History of shingles 2009  . HTN (hypertension) 08/22/2012  . Hx of echocardiogram    Echocardiogram 08/01/12: Mild LVH, EF 55-60%, normal wall motion.  . Hydrocele 10/11/2011  . Hyperlipidemia   . 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 Hypothyroidism   . Macular degeneration disease   . Macular edema   . Migraines    Dr Jannifer Franklin  . MORTON'S NEUROMA, LEFT 08/22/2007   Qualifier: Diagnosis of  By: Linna Darner MD, Gwyndolyn Saxon    . PAD (peripheral artery disease) ([place])   . Peripheral neuropathy   . Personal history of colonic polyps 01/05/2013   2014 2 mm adenomatous polyp   . PREMATURE ATRIAL CONTRACTIONS 03/14/2006   Qualifier: Diagnosis of  By: Linna Darner MD, Gwendolyn [place] 12/16/2011  . SPINAL STENOSIS 03/14/2006   Qualifier: Diagnosis of  ByLinna Darner MD, William   Cervical spine    . Testosterone deficiency    Dr Lawerance Bach, [facility]  . Unspecified vitamin D deficiency 08/06/2012  . Urge incontinence 10/09/2012  . Urinary tract infection, site not specified 02/13/2013  . Weakness 08/02/2012    Past Surgical History:  Procedure Laterality Date  . APPENDECTOMY  1941  . BREAST CYST EXCISION Left 01/16/2013   Procedure: MASS EXCISION AXILLA;  Surgeon: Adin Hector, MD;  Location: [place];  Service: General;  Laterality: Left;  . CAROTID ENDARTERECTOMY  2005    L  . CATARACT EXTRACTION Right   . COLONOSCOPY  2014   Dr.  Deatra Ina  . EYE SURGERY Right 02/2012   retina  . INGUINAL HERNIA REPAIR Bilateral 01/16/2013   Procedure: LAPAROSCOPIC BILATERAL INGUINAL DIRECT AND INDIRECT, FEMORAL, AND OBTURATOR HERNIAS;  Surgeon: Adin Hector, MD;  Location: [place];  Service: General;  Laterality: Bilateral;  . INSERTION OF MESH N/A 01/16/2013   Procedure: INSERTION OF MESH;  Surgeon: Adin Hector, MD;  Location: [place];  Service: General;  Laterality: N/A;  . LUMBAR LAMINECTOMY    . TONSILLECTOMY AND ADENOIDECTOMY    . TOTAL HIP ARTHROPLASTY  2005   L    Family History  Problem Relation Age of Onset  . Stroke Mother 59  . Hypertension Mother   . Diabetes Mother   . Heart disease Mother   . Rectal cancer Father 55  . Heart disease Father        in 24s  . Cancer Father        colorectal  . Nephritis Sister   . Seizures Brother        died as child    Social history:  reports that he quit smoking about 37 years ago. His smoking use included Pipe and Cigars. He quit after 10.00 years of use. He has never used smokeless tobacco. He reports that he drinks about 2.4 oz of alcohol per week . He reports that he does not use drugs.    Allergies  Allergen Reactions  . Latex Rash  . Tape Rash    Paper tape is ok to use     Medications:  Prior to Admission medications   Medication Sig Start Date End Date Taking? Authorizing Provider  aspirin 81 MG tablet Take 81 mg by mouth every morning.    Yes [provider]  atorvastatin (LIPITOR) 20 MG tablet Take one tablet by mouth once daily for cholesterol 03/02/16  Yes Estill Dooms, MD  B Complex-C (B-COMPLEX WITH VITAMIN C) tablet Take 1 tablet by mouth daily.   Yes [provider]  bimatoprost (LUMIGAN) 0.01 % SOLN Place 1 drop into both eyes at bedtime.   Yes [provider]  buPROPion (WELLBUTRIN XL) 150 MG 24 hr tablet Take 150 mg by mouth every morning. 05/22/16  Yes [provider]  buPROPion (WELLBUTRIN XL) 300 MG 24 hr  tablet Take 450 mg by mouth every morning.    Yes [provider]  Cholecalciferol (VITAMIN D3) 1000 units CAPS Take 1,000 Units by mouth daily.   Yes [provider]  clonazePAM (KLONOPIN) 0.5 MG tablet Take 0.25-0.5 mg by mouth at bedtime as needed for anxiety (sleep). Take one daily for anxiety  10/14/15  Yes [provider]  fluticasone (CUTIVATE) 0.05 % cream Apply 1 application topically 2 (two) times daily as needed (irritation).  05/15/16  Yes [provider]  ketoconazole (NIZORAL)  2 % cream Apply 1 application topically 2 (two) times daily as needed for irritation.  05/15/16  Yes [provider]  Javier Docker Oil 500 MG CAPS Take 500 mg by mouth every morning.    Yes [provider]  levothyroxine (SYNTHROID) 50 MCG tablet Take 1/2 tablet by mouth on Sunday mornings and Take one tablet by mouth every other morning for thryoid. 05/16/15  Yes Estill Dooms, MD  linaclotide [facility]) 145 MCG CAPS capsule Take 1 capsule (145 mcg total) by mouth daily before breakfast. 09/01/16  Yes [name], MD  mirtazapine (REMERON) 30 MG tablet Take 30 mg by mouth at bedtime.  02/16/16  Yes [provider]  Multiple Vitamins-Minerals (ICAPS PO) Take 1 tablet by mouth daily.   Yes [provider]  polyethylene glycol (MIRALAX / GLYCOLAX) packet Take 17 g by mouth daily as needed for mild constipation.   Yes [provider]  sertraline (ZOLOFT) 100 MG tablet Take 200 mg by mouth every morning.    Yes [provider]  tamsulosin (FLOMAX) 0.4 MG CAPS capsule TAKE 1 CAPSULE (0.4 MG TOTAL) BY MOUTH DAILY. 05/24/14  Yes [provider]  topiramate (TOPAMAX) 25 MG tablet TAKE ONE TABLET BY MOUTH EVERY MORNING AND 2 TABLETS IN THE EVENING 01/01/16  Yes Dennie Bible, NP    ROS:  Out of a complete 14 system review of symptoms, the patient complains only of the following symptoms, and all other reviewed systems are  negative.  Hearing loss Loss of vision, left macular degeneration, blurred vision Leg swelling Constipation Snoring Incontinence of the bladder Walking difficulty Moles Memory loss, numbness, tremors Depression, anxiety  Blood pressure (!) 165/83, pulse 60, height 6' (1.829 m), weight 147 lb 8 oz (66.9 kg).  Physical Exam  General: The patient is alert and cooperative at the time of the examination.  Skin: 2+ edema below the knees is noted bilaterally.   Neurologic Exam  Mental status: The patient is alert and oriented x 3 at the time of the examination. The patient has apparent normal recent and remote memory, with an apparently normal attention span and concentration ability.   Cranial nerves: Facial symmetry is present. Speech is associated with a vocal tremor, no aphasia is noted. Extraocular movements are full. Visual fields are full.  Motor: The patient has good strength in all 4 extremities.  Sensory examination: Soft touch sensation is symmetric on the face, arms, and legs.  Coordination: The patient has good finger-nose-finger and heel-to-shin bilaterally. Tremors are seen with finger-nose-finger bilaterally, the patient has difficulty with drawing a spiral with translation of tremor into the handwriting.  Gait and station: The patient has a some difficulty arising from a chair, he walks with a walker, he has significant gait instability without the walker. Tandem gait was not attempted. Romberg is negative. No drift is seen.  Reflexes: Deep tendon reflexes are symmetric.   Assessment/Plan:  1. Essential tremor  2. Gait disorder  3. Severe spinal stenosis, cervical at C3-4 level  The patient will be reevaluated for the cervical spinal stenosis. MRI of the cervical spine will be done. The patient has had some decline in his ability to ambulate with intermittent falling. He has urinary incontinence. The patient will continue the Topamax, he will follow-up in 6  months.  Jill Alexanders MD 10/07/2016 3:49 PM  [facility] [address] [place], [place] [address]  Phone [phone] Fax [phone]

## 2016-10-14 ENCOUNTER — Other Ambulatory Visit: Payer: Self-pay

## 2016-10-14 ENCOUNTER — Telehealth: Payer: Self-pay | Admitting: Internal Medicine

## 2016-10-14 DIAGNOSIS — I739 Peripheral vascular disease, unspecified: Secondary | ICD-10-CM

## 2016-10-14 NOTE — Telephone Encounter (Signed)
I left a message asking the patient to schedule AWV-S at [facility] clinic. (Last AWV 07/21/11) VDM (DD)

## 2016-10-27 ENCOUNTER — Non-Acute Institutional Stay: Payer: Medicare Other

## 2016-10-27 VITALS — BP 150/78 | HR 56 | Temp 98.4°F | Ht 72.0 in | Wt 148.0 lb

## 2016-10-27 DIAGNOSIS — Z0000 Encounter for general adult medical examination without abnormal findings: Secondary | ICD-10-CM | POA: Diagnosis not present

## 2016-10-27 NOTE — Patient Instructions (Addendum)
Jose Leach , Thank you for taking time to come for your Medicare Wellness Visit. I appreciate your ongoing commitment to your health goals. Please review the following plan we discussed and let me know if I can assist you in the future.   Screening recommendations/referrals: Colonoscopy up to date, you are over age 81 Recommended yearly ophthalmology/optometry visit for glaucoma screening and checkup Recommended yearly dental visit for hygiene and checkup  Vaccinations: Influenza vaccine due Pneumococcal vaccine up to date Tdap vaccine due, if you would like this we will send prescription to your pharmacy. Shingles vaccine (Shingrix) due, if you would like this we will send prescription to your pharmacy.  Advanced directives: In Chart  Conditions/risks identified: None  Next appointment: Dr. Bubba Camp 11/17/16 @ 10:30am  Preventive Care 65 Years and Older, Male Preventive care refers to lifestyle choices and visits with your health care provider that can promote health and wellness. What does preventive care include?  A yearly physical exam. This is also called an annual well check.  Dental exams once or twice a year.  Routine eye exams. Ask your health care provider how often you should have your eyes checked.  Personal lifestyle choices, including:  Daily care of your teeth and gums.  Regular physical activity.  Eating a healthy diet.  Avoiding tobacco and drug use.  Limiting alcohol use.  Practicing safe sex.  Taking low doses of aspirin every day.  Taking vitamin and mineral supplements as recommended by your health care provider. What happens during an annual well check? The services and screenings done by your health care provider during your annual well check will depend on your age, overall health, lifestyle risk factors, and family history of disease. Counseling  Your health care provider may ask you questions about your:  Alcohol use.  Tobacco  use.  Drug use.  Emotional well-being.  Home and relationship well-being.  Sexual activity.  Eating habits.  History of falls.  Memory and ability to understand (cognition).  Work and work Statistician. Screening  You may have the following tests or measurements:  Height, weight, and BMI.  Blood pressure.  Lipid and cholesterol levels. These may be checked every 5 years, or more frequently if you are over 16 years old.  Skin check.  Lung cancer screening. You may have this screening every year starting at age 79 if you have a 30-pack-year history of smoking and currently smoke or have quit within the past 15 years.  Fecal occult blood test (FOBT) of the stool. You may have this test every year starting at age 81.  Flexible sigmoidoscopy or colonoscopy. You may have a sigmoidoscopy every 5 years or a colonoscopy every 10 years starting at age 6.  Prostate cancer screening. Recommendations will vary depending on your family history and other risks.  Hepatitis C blood test.  Hepatitis B blood test.  Sexually transmitted disease (STD) testing.  Diabetes screening. This is done by checking your blood sugar (glucose) after you have not eaten for a while (fasting). You may have this done every 1-3 years.  Abdominal aortic aneurysm (AAA) screening. You may need this if you are a current or former smoker.  Osteoporosis. You may be screened starting at age 57 if you are at high risk. Talk with your health care provider about your test results, treatment options, and if necessary, the need for more tests. Vaccines  Your health care provider may recommend certain vaccines, such as:  Influenza vaccine. This is recommended every  year.  Tetanus, diphtheria, and acellular pertussis (Tdap, Td) vaccine. You may need a Td booster every 10 years.  Zoster vaccine. You may need this after age 61.  Pneumococcal 13-valent conjugate (PCV13) vaccine. One dose is recommended after age  11.  Pneumococcal polysaccharide (PPSV23) vaccine. One dose is recommended after age 51. Talk to your health care provider about which screenings and vaccines you need and how often you need them. This information is not intended to replace advice given to you by your health care provider. Make sure you discuss any questions you have with your health care provider. Document Released: 02/28/2015 Document Revised: 10/22/2015 Document Reviewed: 12/03/2014 Elsevier Interactive Patient Education  2017 [place] Prevention in the Home Falls can cause injuries. They can happen to people of all ages. There are many things you can do to make your home safe and to help prevent falls. What can I do on the outside of my home?  Regularly fix the edges of walkways and driveways and fix any cracks.  Remove anything that might make you trip as you walk through a door, such as a raised step or threshold.  Trim any bushes or trees on the path to your home.  Use bright outdoor lighting.  Clear any walking paths of anything that might make someone trip, such as rocks or tools.  Regularly check to see if handrails are loose or broken. Make sure that both sides of any steps have handrails.  Any raised decks and porches should have guardrails on the edges.  Have any leaves, snow, or ice cleared regularly.  Use sand or salt on walking paths during winter.  Clean up any spills in your garage right away. This includes oil or grease spills. What can I do in the bathroom?  Use night lights.  Install grab bars by the toilet and in the tub and shower. Do not use towel bars as grab bars.  Use non-skid mats or decals in the tub or shower.  If you need to sit down in the shower, use a plastic, non-slip stool.  Keep the floor dry. Clean up any water that spills on the floor as soon as it happens.  Remove soap buildup in the tub or shower regularly.  Attach bath mats securely with double-sided  non-slip rug tape.  Do not have throw rugs and other things on the floor that can make you trip. What can I do in the bedroom?  Use night lights.  Make sure that you have a light by your bed that is easy to reach.  Do not use any sheets or blankets that are too big for your bed. They should not hang down onto the floor.  Have a firm chair that has side arms. You can use this for support while you get dressed.  Do not have throw rugs and other things on the floor that can make you trip. What can I do in the kitchen?  Clean up any spills right away.  Avoid walking on wet floors.  Keep items that you use a lot in easy-to-reach places.  If you need to reach something above you, use a strong step stool that has a grab bar.  Keep electrical cords out of the way.  Do not use floor polish or wax that makes floors slippery. If you must use wax, use non-skid floor wax.  Do not have throw rugs and other things on the floor that can make you trip. What can  I do with my stairs?  Do not leave any items on the stairs.  Make sure that there are handrails on both sides of the stairs and use them. Fix handrails that are broken or loose. Make sure that handrails are as long as the stairways.  Check any carpeting to make sure that it is firmly attached to the stairs. Fix any carpet that is loose or worn.  Avoid having throw rugs at the top or bottom of the stairs. If you do have throw rugs, attach them to the floor with carpet tape.  Make sure that you have a light switch at the top of the stairs and the bottom of the stairs. If you do not have them, ask someone to add them for you. What else can I do to help prevent falls?  Wear shoes that:  Do not have high heels.  Have rubber bottoms.  Are comfortable and fit you well.  Are closed at the toe. Do not wear sandals.  If you use a stepladder:  Make sure that it is fully opened. Do not climb a closed stepladder.  Make sure that both  sides of the stepladder are locked into place.  Ask someone to hold it for you, if possible.  Clearly mark and make sure that you can see:  Any grab bars or handrails.  First and last steps.  Where the edge of each step is.  Use tools that help you move around (mobility aids) if they are needed. These include:  Canes.  Walkers.  Scooters.  Crutches.  Turn on the lights when you go into a dark area. Replace any light bulbs as soon as they burn out.  Set up your furniture so you have a clear path. Avoid moving your furniture around.  If any of your floors are uneven, fix them.  If there are any pets around you, be aware of where they are.  Review your medicines with your doctor. Some medicines can make you feel dizzy. This can increase your chance of falling. Ask your doctor what other things that you can do to help prevent falls. This information is not intended to replace advice given to you by your health care provider. Make sure you discuss any questions you have with your health care provider. Document Released: 11/28/2008 Document Revised: 07/10/2015 Document Reviewed: 03/08/2014 Elsevier Interactive Patient Education  2017 [employer].

## 2016-10-27 NOTE — Progress Notes (Signed)
Subjective:   Jose Leach is a 83 y.o. male who presents for Medicare Annual/Subsequent preventive examination at [facility]- 07/21/11    Objective:    Vitals: BP (!) 150/78 (BP Location: Left Arm, Patient Position: Sitting)   Pulse (!) 56   Temp 98.4 F (36.9 C) (Oral)   Ht 6' (1.829 m)   Wt 148 lb (67.1 kg)   SpO2 98%   BMI 20.07 kg/m   Body mass index is 20.07 kg/m.  Tobacco History  Smoking Status  . Former Smoker  . Years: 10.00  . Types: Pipe, Cigars  . Quit date: 02/16/1979  Smokeless Tobacco  . Never Used    Comment: Quit about age 78      Counseling given: Not Answered   Past Medical History:  Diagnosis Date  . Acute urinary retention 01/28/2013  . AKI (acute kidney injury) ([place]) 01/26/2013  . Altered mental status 09/09/2013  . Anemia    B12 deficiency  . ANXIETY 06/07/2007   Qualifier: Diagnosis of  By: Talbert Cage CMA ([place]), June    . ARTHRITIS 06/07/2007   Qualifier: Diagnosis of  By: Talbert Cage CMA ([place]), June    . Balance problems 04/05/2011  . Benign essential tremor   . Benign fibroma of prostate 10/11/2011  . Bilateral inguinal hernia 10/09/2012  . Bilateral leg edema 09/13/2012  . Bladder retention 02/05/2013  . Bradycardia, sinus 08/02/2012  . CAROTID ARTERY DISEASE 03/31/2007   Qualifier: Diagnosis of  By: Linna Darner MD, Gwyndolyn Saxon    . Carotid stenosis    s/p L CEA  . Cataract, nuclear 03/21/2014  . 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 Cervical spinal stenosis   . Cervical stenosis of spine   . Critical lower limb ischemia   . Degeneration macular 11/02/2011  . Depression    Dr Albertine Patricia  . Difficulty in walking(719.7) 04/05/2011  . ESOPHAGITIS 08/06/2002   Qualifier: Diagnosis of  By: Talbert Cage CMA ([place]), June    . Essential and other specified forms of tremor 04/18/2013  . Falls   . Family history of colon cancer 07/24/2012  . Fecal impaction ([place]) 10/11/2012  . Femoral hernia, bilateral s/p lap repair  01/16/2013 01/16/2013  . Glaucoma, compensated 03/21/2014  . H/O cardiovascular stress test    a. 02/2003 -> no ischemia/infarct.  Marland Kitchen HAMMER TOE 04/23/2010   Qualifier: Diagnosis of  By: Linna Darner MD, Gwyndolyn Saxon    . Hematoma of leg   . Hip hematoma, right 09/09/2013  . History of shingles 2009  . HTN (hypertension) 08/22/2012  . Hx of echocardiogram    Echocardiogram 08/01/12: Mild LVH, EF 55-60%, normal wall motion.  . Hydrocele 10/11/2011  . Hyperlipidemia   . 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 Hypothyroidism   . Macular degeneration disease   . Macular edema   . Migraines    Dr Jannifer Franklin  . MORTON'S NEUROMA, LEFT 08/22/2007   Qualifier: Diagnosis of  By: Linna Darner MD, Gwyndolyn Saxon    . PAD (peripheral artery disease) ([place])   . Peripheral neuropathy   . Personal history of colonic polyps 01/05/2013   2014 2 mm adenomatous polyp   . PREMATURE ATRIAL CONTRACTIONS 03/14/2006   Qualifier: Diagnosis of  By: Linna Darner MD, Gwendolyn Lima 12/16/2011  . SPINAL STENOSIS 03/14/2006   Qualifier: Diagnosis of  By: Linna Darner MD, William   Cervical spine    . Testosterone deficiency    Dr Lawerance Bach, [facility]  . Unspecified vitamin D  deficiency 08/06/2012  . Urge incontinence 10/09/2012  . Urinary tract infection, site not specified 02/13/2013  . Weakness 08/02/2012   Past Surgical History:  Procedure Laterality Date  . APPENDECTOMY  1941  . BREAST CYST EXCISION Left 01/16/2013   Procedure: MASS EXCISION AXILLA;  Surgeon: Adin Hector, MD;  Location: [facility];  Service: General;  Laterality: Left;  . CAROTID ENDARTERECTOMY  2005    L  . CATARACT EXTRACTION Right   . COLONOSCOPY  2014   Dr. Deatra Ina  . EYE SURGERY Right 02/2012   retina  . INGUINAL HERNIA REPAIR Bilateral 01/16/2013   Procedure: LAPAROSCOPIC BILATERAL INGUINAL DIRECT AND INDIRECT, FEMORAL, AND OBTURATOR HERNIAS;  Surgeon: Adin Hector, MD;  Location: [facility];  Service: General;  Laterality: Bilateral;  . INSERTION OF MESH N/A 01/16/2013    Procedure: INSERTION OF MESH;  Surgeon: Adin Hector, MD;  Location: [facility];  Service: General;  Laterality: N/A;  . LUMBAR LAMINECTOMY    . TONSILLECTOMY AND ADENOIDECTOMY    . TOTAL HIP ARTHROPLASTY  2005   L   Family History  Problem Relation Age of Onset  . Stroke Mother 16  . Hypertension Mother   . Diabetes Mother   . Heart disease Mother   . Rectal cancer Father 61  . Heart disease Father        in 45s  . Cancer Father        colorectal  . Nephritis Sister   . Seizures Brother        died as child   History  Sexual Activity  . Sexual activity: Not on file    Outpatient Encounter Prescriptions as of 10/27/2016  Medication Sig  . ALPRAZolam (XANAX) 0.5 MG tablet Take 2 tablets approximately 45 minutes prior to the MRI study, take a third tablet if needed.  Marland Kitchen aspirin 81 MG tablet Take 81 mg by mouth every morning.   Marland Kitchen atorvastatin (LIPITOR) 20 MG tablet Take one tablet by mouth once daily for cholesterol  . B Complex-C (B-COMPLEX WITH VITAMIN C) tablet Take 1 tablet by mouth daily.  . bimatoprost (LUMIGAN) 0.01 % SOLN Place 1 drop into both eyes at bedtime.  Marland Kitchen buPROPion (WELLBUTRIN XL) 150 MG 24 hr tablet Take 150 mg by mouth every morning.  . Cholecalciferol (VITAMIN D3) 1000 units CAPS Take 1,000 Units by mouth daily.  . clonazePAM (KLONOPIN) 0.5 MG tablet Take 0.25-0.5 mg by mouth at bedtime as needed for anxiety (sleep). Take one daily for anxiety   . fluticasone (CUTIVATE) 0.05 % cream Apply 1 application topically 2 (two) times daily as needed (irritation).   Marland Kitchen ketoconazole (NIZORAL) 2 % cream Apply 1 application topically 2 (two) times daily as needed for irritation.   Javier Docker Oil 500 MG CAPS Take 500 mg by mouth every morning.   Marland Kitchen levothyroxine (SYNTHROID) 50 MCG tablet Take 1/2 tablet by mouth on Sunday mornings and Take one tablet by mouth every other morning for thryoid.  Marland Kitchen linaclotide (LINZESS) 145 MCG CAPS capsule Take 1 capsule (145 mcg total) by mouth daily  before breakfast.  . mirtazapine (REMERON) 30 MG tablet Take 30 mg by mouth at bedtime.   . Multiple Vitamins-Minerals (ICAPS PO) Take 1 tablet by mouth daily.  . polyethylene glycol (MIRALAX / GLYCOLAX) packet Take 17 g by mouth daily as needed for mild constipation.  . sertraline (ZOLOFT) 100 MG tablet Take 200 mg by mouth every morning.   . tamsulosin (FLOMAX) 0.4 MG  CAPS capsule TAKE 1 CAPSULE (0.4 MG TOTAL) BY MOUTH DAILY.  Marland Kitchen topiramate (TOPAMAX) 25 MG tablet TAKE ONE TABLET BY MOUTH EVERY MORNING AND 2 TABLETS IN THE EVENING  . [DISCONTINUED] buPROPion (WELLBUTRIN XL) 300 MG 24 hr tablet Take 450 mg by mouth every morning.    No facility-administered encounter medications on file as of 10/27/2016.     Activities of Daily Living In your present state of health, do you have any difficulty performing the following activities: 10/27/2016  Hearing? Y  Vision? Y  Comment macular degeneration  Difficulty concentrating or making decisions? N  Walking or climbing stairs? Y  Dressing or bathing? Y  Doing errands, shopping? Y  Preparing Food and eating ? Y  Using the Toilet? N  In the past six months, have you accidently leaked urine? N  Do you have problems with loss of bowel control? N  Managing your Medications? Y  Managing your Finances? Y  Housekeeping or managing your Housekeeping? Y  Some recent data might be hidden    Patient Care Team: [name], MD as PCP - General (Internal Medicine) [name], MD as Consulting Physician (Urology) [name], MD as Consulting Physician (Neurology) [name], MD as Consulting Physician (Dermatology) [name], MD as Consulting Physician (Gastroenterology) [name], MD as Consulting Physician (Orthopedic Surgery) [name], MD as Consulting Physician (Cardiology) [name], MD as Consulting Physician (Ophthalmology) [name], MD as Referring Physician (Psychiatry) [name], MD as  Consulting Physician (General Surgery) [name], MD as Consulting Physician (Orthopedic Surgery)   Assessment:     Exercise Activities and Dietary recommendations Current Exercise Habits: The patient does not participate in regular exercise at present, Exercise limited by: orthopedic condition(s)  Goals    None     Fall Risk Fall Risk  10/27/2016 06/09/2016 02/03/2016 10/28/2015 01/16/2015  Falls in the past year? Yes Yes Yes Yes Yes  Number falls in past yr: 2 or more 2 or more 2 or more 2 or more 2 or more  Comment - - 2 falls in last 2 months - -  Injury with Fall? Yes No Yes No (No Data)  Comment L hip - scraps and bruises - head  Risk Factor Category  - - - - -  Risk for fall due to : - - - Impaired balance/gait -  Follow up - - - - -   Depression Screen PHQ 2/9 Scores 10/27/2016 06/09/2016 10/28/2015 01/16/2015  PHQ - 2 Score 1 0 3 0  PHQ- 9 Score - - 4 -    Cognitive Function MMSE - Mini Mental State Exam 10/27/2016  Orientation to time 4  Orientation to Place 5  Registration 3  Attention/ Calculation 4  Recall 0  Language- name 2 objects 2  Language- repeat 1  Language- follow 3 step command 3  Language- read & follow direction 1  Write a sentence 1  Copy design 1  Total score 25        Immunization History  Administered Date(s) Administered  . DTaP 11/05/2005  . Hepatitis B 1932-12-05  . Influenza Split 11/12/2011, 11/29/2013  . Influenza Whole 10/30/2007, 11/12/2008, 11/26/2009  . Influenza, High Dose Seasonal PF 12/31/2015  . PPD Test 06/23/2011  . Pneumococcal Conjugate-13 01/14/2016  . Pneumococcal Polysaccharide-23 02/16/1999  . Varicella 11/09/2005  . Zoster 08/30/2006   Screening Tests Health Maintenance  Topic Date Due  . INFLUENZA VACCINE  09/15/2016  . TETANUS/TDAP  02/15/2018 (  Originally 02/15/1951)  . PNA vac Low Risk Adult  Completed      Plan:    I have personally reviewed and addressed the Medicare Annual Wellness questionnaire  and have noted the following in the patient's chart:  A. Medical and social history B. Use of alcohol, tobacco or illicit drugs  C. Current medications and supplements D. Functional ability and status E.  Nutritional status F.  Physical activity G. Advance directives H. List of other physicians I.  Hospitalizations, surgeries, and ER visits in previous 12 months J.  [place] to include hearing, vision, cognitive, depression L. Referrals and appointments - none  In addition, I have reviewed and discussed with patient certain preventive protocols, quality metrics, and best practice recommendations. A written personalized care plan for preventive services as well as general preventive health recommendations were provided to patient.  See attached scanned questionnaire for additional information.   Signed,   Rich Reining, RN Nurse Health Advisor   Quick Notes   Health Maintenance: TDAP and shingles due. Flu vaccine will be received when facility gets it in stock.     Abnormal Screen: MMSE 25/30. Did not pass clock drawing     Patient Concerns: Pain Left hip- twisted it last week. Is seeing orthopedic doctor tomorrow.     Nurse Concerns: None

## 2016-11-15 LAB — CBC
HCT: 46.5 % (ref 38.5–50.0)
HEMOGLOBIN: 15.2 g/dL (ref 13.2–17.1)
MCH: 29.9 pg (ref 27.0–33.0)
MCHC: 32.7 g/dL (ref 32.0–36.0)
MCV: 91.5 fL (ref 80.0–100.0)
MPV: 10.6 fL (ref 7.5–12.5)
Platelets: 178 10*3/uL (ref 140–400)
RBC: 5.08 10*6/uL (ref 4.20–5.80)
RDW: 12.6 % (ref 11.0–15.0)
WBC: 5.2 10*3/uL (ref 3.8–10.8)

## 2016-11-15 LAB — COMPLETE METABOLIC PANEL WITH GFR
AG Ratio: 1.4 (calc) (ref 1.0–2.5)
ALKALINE PHOSPHATASE (APISO): 51 U/L (ref 40–115)
ALT: 21 U/L (ref 9–46)
AST: 19 U/L (ref 10–35)
Albumin: 4 g/dL (ref 3.6–5.1)
BILIRUBIN TOTAL: 0.6 mg/dL (ref 0.2–1.2)
BUN/Creatinine Ratio: 25 (calc) — ABNORMAL HIGH (ref 6–22)
BUN: 33 mg/dL — AB (ref 7–25)
CHLORIDE: 107 mmol/L (ref 98–110)
CO2: 28 mmol/L (ref 20–32)
Calcium: 9.5 mg/dL (ref 8.6–10.3)
Creat: 1.33 mg/dL — ABNORMAL HIGH (ref 0.70–1.11)
GFR, Est African American: 57 mL/min/{1.73_m2} — ABNORMAL LOW (ref >=60)
GFR, Est Non African American: 49 mL/min/{1.73_m2} — ABNORMAL LOW (ref >=60)
GLUCOSE: 74 mg/dL (ref 65–99)
Globulin: 2.8 g/dL (calc) (ref 1.9–3.7)
Potassium: 3.9 mmol/L (ref 3.5–5.3)
Sodium: 142 mmol/L (ref 135–146)
Total Protein: 6.8 g/dL (ref 6.1–8.1)

## 2016-11-15 LAB — TSH: TSH: 2.3 mIU/L (ref 0.40–4.50)

## 2016-11-15 LAB — LIPID PANEL
CHOL/HDL RATIO: 2.7 (calc) (ref <5.0)
CHOLESTEROL: 182 mg/dL (ref <200)
HDL: 68 mg/dL (ref >40)
LDL CHOLESTEROL (CALC): 95 mg/dL
Non-HDL Cholesterol (Calc): 114 mg/dL (calc) (ref <130)
Triglycerides: 99 mg/dL (ref <150)

## 2016-11-16 ENCOUNTER — Other Ambulatory Visit: Payer: Self-pay | Admitting: Internal Medicine

## 2016-11-16 ENCOUNTER — Ambulatory Visit
Admission: RE | Admit: 2016-11-16 | Discharge: 2016-11-16 | Disposition: A | Discharge status: Home / Self Care | Payer: Medicare Other | Source: Ambulatory Visit | Attending: Neurology | Admitting: Neurology

## 2016-11-16 DIAGNOSIS — M4802 Spinal stenosis, cervical region: Secondary | ICD-10-CM

## 2016-11-17 ENCOUNTER — Non-Acute Institutional Stay: Payer: Medicare Other | Admitting: Internal Medicine

## 2016-11-17 ENCOUNTER — Encounter: Payer: Self-pay | Admitting: Internal Medicine

## 2016-11-17 ENCOUNTER — Telehealth: Payer: Self-pay | Admitting: Neurology

## 2016-11-17 VITALS — BP 122/70 | HR 62 | Temp 97.4°F | Resp 16 | Ht 72.0 in | Wt 144.2 lb

## 2016-11-17 DIAGNOSIS — E039 Hypothyroidism, unspecified: Secondary | ICD-10-CM | POA: Diagnosis not present

## 2016-11-17 DIAGNOSIS — K5901 Slow transit constipation: Secondary | ICD-10-CM | POA: Diagnosis not present

## 2016-11-17 DIAGNOSIS — N183 Chronic kidney disease, stage 3 unspecified: Secondary | ICD-10-CM

## 2016-11-17 DIAGNOSIS — H3530 Unspecified macular degeneration: Secondary | ICD-10-CM | POA: Diagnosis not present

## 2016-11-17 DIAGNOSIS — N400 Enlarged prostate without lower urinary tract symptoms: Secondary | ICD-10-CM

## 2016-11-17 DIAGNOSIS — I739 Peripheral vascular disease, unspecified: Secondary | ICD-10-CM | POA: Diagnosis not present

## 2016-11-17 DIAGNOSIS — R2681 Unsteadiness on feet: Secondary | ICD-10-CM | POA: Diagnosis not present

## 2016-11-17 DIAGNOSIS — E782 Mixed hyperlipidemia: Secondary | ICD-10-CM | POA: Diagnosis not present

## 2016-11-17 DIAGNOSIS — G250 Essential tremor: Secondary | ICD-10-CM | POA: Diagnosis not present

## 2016-11-17 DIAGNOSIS — F333 Major depressive disorder, recurrent, severe with psychotic symptoms: Secondary | ICD-10-CM | POA: Insufficient documentation

## 2016-11-17 DIAGNOSIS — F339 Major depressive disorder, recurrent, unspecified: Secondary | ICD-10-CM | POA: Diagnosis not present

## 2016-11-17 DIAGNOSIS — N1831 Chronic kidney disease, stage 3a: Secondary | ICD-10-CM | POA: Insufficient documentation

## 2016-11-17 DIAGNOSIS — R269 Unspecified abnormalities of gait and mobility: Secondary | ICD-10-CM

## 2016-11-17 NOTE — Progress Notes (Signed)
[facility]  Provider: [name] MD   Location:  [place] of Service:  Clinic (12)  PCP: [name], MD Patient Care Team: [name], MD as PCP - General (Internal Medicine) [name], MD as Consulting Physician (Urology) [name], MD as Consulting Physician (Neurology) [name], MD as Consulting Physician (Dermatology) [name], MD as Consulting Physician (Gastroenterology) [name], MD as Consulting Physician (Orthopedic Surgery) [name], MD as Consulting Physician (Cardiology) [name], MD as Consulting Physician (Ophthalmology) [name], MD as Referring Physician (Psychiatry) [name], MD as Consulting Physician (General Surgery) [name], MD as Consulting Physician (Orthopedic Surgery)  Extended Emergency Contact Information Primary Emergency Contact: [facility] Address: [address]          [place],  [address] [place] Phone: [phone] Relation: Spouse   Goals of Care: Advanced Directive information  Advanced Directives 10/27/2016  Does Patient Have a Medical Advance Directive? Yes  Type of Advance Directive Living will;Healthcare Power of Attorney  Does patient want to make changes to medical advance directive? No - Patient declined  Copy of [place] in Chart? Yes  Pre-existing out of facility DNR order (yellow form or pink MOST form) -     Chief Complaint  Patient presents with  . Medical Management of Chronic Issues    3 month follow up  . Medication Refill    No refills needed at this time.   Marland Kitchen Results    Discuss labs.     HPI: Patient is a 83 y.o. male seen today for routine visit  Unsteady gait- poor balance. Had a fall and had ED visit on 10/01/16. He has had alteast 3 falls in last 6 months. Fractures were ruled out. No recent fall. Uses a rolling walker.  On today's visit, he is using both hands to push him up from the chair and takes some time to find balance with his chair with his arms before sitting down.   Hypothyroidism- takes levothyroxine 25 mcg daily  Hyperlipidemia- taking atorvastatin 20 mg daily, tolerating well. Also on baby aspirin  Essential tremor- currently taking topamax and followed by neurology  Chronic constipation- miralax and linzess has been helpful  BPH- denies urinary complaints.   Macular degeneration to left eye- taking bimatoprost eye drop.   Chronic depression- seen by psychiatry, currently on remeron, sertraline, wellbutrin  CKD stage 3- reviewed renal function   Past Medical History:  Diagnosis Date  . Acute urinary retention 01/28/2013  . AKI (acute kidney injury) ([place]) 01/26/2013  . Altered mental status 09/09/2013  . Anemia    B12 deficiency  . ANXIETY 06/07/2007   Qualifier: Diagnosis of  By: Talbert Cage CMA ([place]), June    . ARTHRITIS 06/07/2007   Qualifier: Diagnosis of  By: Talbert Cage CMA ([place]), June    . Balance problems 04/05/2011  . Benign essential tremor   . Benign fibroma of prostate 10/11/2011  . Bilateral inguinal hernia 10/09/2012  . Bilateral leg edema 09/13/2012  . Bladder retention 02/05/2013  . Bradycardia, sinus 08/02/2012  . CAROTID ARTERY DISEASE 03/31/2007   Qualifier: Diagnosis of  By: Linna Darner MD, Gwyndolyn Saxon    . Carotid stenosis    s/p L CEA  . Cataract, nuclear 03/21/2014  . 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 Cervical spinal stenosis   . Cervical  stenosis of spine   . Critical lower limb ischemia   . Degeneration macular 11/02/2011  . Depression    Dr Albertine Patricia  . Difficulty in walking(719.7) 04/05/2011  . ESOPHAGITIS 08/06/2002   Qualifier: Diagnosis of  By: Talbert Cage CMA ([place]), June    . Essential and other specified forms of tremor 04/18/2013  . Falls   . Family history of colon cancer 07/24/2012  . Fecal impaction ([place]) 10/11/2012  . Femoral hernia, bilateral s/p lap repair  01/16/2013 01/16/2013  . Glaucoma, compensated 03/21/2014  . H/O cardiovascular stress test    a. 02/2003 -> no ischemia/infarct.  Marland Kitchen HAMMER TOE 04/23/2010   Qualifier: Diagnosis of  By: Linna Darner MD, Gwyndolyn Saxon    . Hematoma of leg   . Hip hematoma, right 09/09/2013  . History of shingles 2009  . HTN (hypertension) 08/22/2012  . Hx of echocardiogram    Echocardiogram 08/01/12: Mild LVH, EF 55-60%, normal wall motion.  . Hydrocele 10/11/2011  . Hyperlipidemia   . 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 Hypothyroidism   . Macular degeneration disease   . Macular edema   . Migraines    Dr Jannifer Franklin  . MORTON'S NEUROMA, LEFT 08/22/2007   Qualifier: Diagnosis of  By: Linna Darner MD, Gwyndolyn Saxon    . PAD (peripheral artery disease) ([place])   . Peripheral neuropathy   . Personal history of colonic polyps 01/05/2013   2014 2 mm adenomatous polyp   . PREMATURE ATRIAL CONTRACTIONS 03/14/2006   Qualifier: Diagnosis of  By: Linna Darner MD, Gwendolyn Lima 12/16/2011  . SPINAL STENOSIS 03/14/2006   Qualifier: Diagnosis of  By: Linna Darner MD, [place]   Cervical spine    . Testosterone deficiency    Dr Lawerance Bach, [facility]  . Unspecified vitamin D deficiency 08/06/2012  . Urge incontinence 10/09/2012  . Urinary tract infection, site not specified 02/13/2013  . Weakness 08/02/2012   Past Surgical History:  Procedure Laterality Date  . APPENDECTOMY  1941  . BREAST CYST EXCISION Left 01/16/2013   Procedure: MASS EXCISION AXILLA;  Surgeon: Adin Hector, MD;  Location: [facility];  Service: General;  Laterality: Left;  . CAROTID ENDARTERECTOMY  2005    L  . CATARACT EXTRACTION Right   . COLONOSCOPY  2014   Dr. Deatra Ina  . EYE SURGERY Right 02/2012   retina  . INGUINAL HERNIA REPAIR Bilateral 01/16/2013   Procedure: LAPAROSCOPIC BILATERAL INGUINAL DIRECT AND INDIRECT, FEMORAL, AND OBTURATOR HERNIAS;  Surgeon: Adin Hector, MD;  Location: [facility];  Service: General;  Laterality: Bilateral;  . INSERTION OF MESH N/A 01/16/2013    Procedure: INSERTION OF MESH;  Surgeon: Adin Hector, MD;  Location: [facility];  Service: General;  Laterality: N/A;  . LUMBAR LAMINECTOMY    . TONSILLECTOMY AND ADENOIDECTOMY    . TOTAL HIP ARTHROPLASTY  2005   L    reports that he quit smoking about 37 years ago. His smoking use included Pipe and Cigars. He quit after 10.00 years of use. He has never used smokeless tobacco. He reports that he does not drink alcohol or use drugs. Social History   Social History  . Marital status: Married    Spouse name: [name]  . Number of children: 2  . Years of education: N/A   Occupational History  . retired  Retired   Social History Main Topics  . Smoking status: Former Smoker    Years: 10.00    Types: Pipe, Cigars    Quit date: 02/16/1979  .  Smokeless tobacco: Never Used     Comment: Quit about age 76   . Alcohol use No  . Drug use: No  . Sexual activity: Not on file   Other Topics Concern  . Not on file   Social History Narrative   [facility]   Married - [name]   Former smoker -stopped 1981   Alcohol -wine occasionally    Exercise - walking, Ne-step    POA, Living Will   [employer] with walker     Family History  Problem Relation Age of Onset  . Stroke Mother 40  . Hypertension Mother   . Diabetes Mother   . Heart disease Mother   . Rectal cancer Father 19  . Heart disease Father        in 71s  . Cancer Father        colorectal  . Nephritis Sister   . Seizures Brother        died as child    Health Maintenance  Topic Date Due  . INFLUENZA VACCINE  11/24/2016 (Originally 09/15/2016)  . TETANUS/TDAP  02/15/2018 (Originally 02/15/1951)  . PNA vac Low Risk Adult  Completed    Allergies  Allergen Reactions  . Latex Rash  . Tape Rash    Paper tape is ok to use     Outpatient Encounter Prescriptions as of 11/17/2016  Medication Sig  . ALPRAZolam (XANAX) 0.5 MG tablet Take 2 tablets approximately 45 minutes prior to the MRI study, take a third  tablet if needed.  Marland Kitchen aspirin 81 MG tablet Take 81 mg by mouth every morning.   Marland Kitchen atorvastatin (LIPITOR) 20 MG tablet Take one tablet by mouth once daily for cholesterol  . B Complex-C (B-COMPLEX WITH VITAMIN C) tablet Take 1 tablet by mouth daily.  . bimatoprost (LUMIGAN) 0.01 % SOLN Place 1 drop into both eyes at bedtime.  Marland Kitchen buPROPion (WELLBUTRIN XL) 150 MG 24 hr tablet Take 150 mg by mouth every morning.  . Cholecalciferol (VITAMIN D3) 1000 units CAPS Take 1,000 Units by mouth daily.  . clonazePAM (KLONOPIN) 0.5 MG tablet Take 0.25-0.5 mg by mouth at bedtime as needed for anxiety (sleep). Take one daily for anxiety   . fluticasone (CUTIVATE) 0.05 % cream Apply 1 application topically 2 (two) times daily as needed (irritation).   Marland Kitchen ketoconazole (NIZORAL) 2 % cream Apply 1 application topically 2 (two) times daily as needed for irritation.   Javier Docker Oil 500 MG CAPS Take 500 mg by mouth every morning.   Marland Kitchen levothyroxine (SYNTHROID) 50 MCG tablet Take 1/2 tablet by mouth on Sunday mornings and Take one tablet by mouth every other morning for thryoid.  Marland Kitchen linaclotide (LINZESS) 145 MCG CAPS capsule Take 1 capsule (145 mcg total) by mouth daily before breakfast.  . mirtazapine (REMERON) 30 MG tablet Take 30 mg by mouth at bedtime.   . Multiple Vitamins-Minerals (ICAPS PO) Take 1 tablet by mouth daily.  . polyethylene glycol (MIRALAX / GLYCOLAX) packet Take 17 g by mouth daily as needed for mild constipation.  . sertraline (ZOLOFT) 100 MG tablet Take 200 mg by mouth every morning.   . tamsulosin (FLOMAX) 0.4 MG CAPS capsule TAKE 1 CAPSULE (0.4 MG TOTAL) BY MOUTH DAILY.  Marland Kitchen topiramate (TOPAMAX) 25 MG tablet TAKE ONE TABLET BY MOUTH EVERY MORNING AND 2 TABLETS IN THE EVENING   No facility-administered encounter medications on file as of 11/17/2016.     Review of Systems  Constitutional: Negative for appetite change, chills, diaphoresis and fever.  HENT: Positive for hearing loss. Negative for  congestion, ear pain, mouth sores, sore throat and trouble swallowing.   Eyes: Positive for visual disturbance. Negative for pain and itching.       Has macular degeneration to left eye. Wears corrective glasses  Respiratory: Negative for cough and shortness of breath.   Cardiovascular: Negative for chest pain, palpitations and leg swelling.  Gastrointestinal: Positive for constipation. Negative for abdominal pain, blood in stool, diarrhea, nausea and vomiting.       Has intermittent abdominal cramp like pain for several years. Takes miralax and this helps with his constipation. Denies blood in stool  Genitourinary: Negative for dysuria and hematuria.       Has history of BPH  Musculoskeletal: Positive for arthralgias, back pain and gait problem.       Has history of spinal stenosis, unsteady gait, uses walker with break and seat  Neurological: Positive for tremors and numbness. Negative for dizziness, seizures, weakness and headaches.       Occasional numbness to his fingers. Has essential tremors  Hematological: Bruises/bleeds easily.  Psychiatric/Behavioral: Positive for dysphoric mood. Negative for behavioral problems, sleep disturbance and suicidal ideas. The patient is not nervous/anxious.        Follows with Dr [name], psychiatrist for depression    Vitals:   11/17/16 1057  BP: 122/70  Pulse: 62  Resp: 16  Temp: (!) 97.4 F (36.3 C)  TempSrc: Oral  SpO2: 94%  Weight: 144 lb 3.2 oz (65.4 kg)  Height: 6' (1.829 m)   Body mass index is 19.56 kg/m.   Wt Readings from Last 3 Encounters:  11/17/16 144 lb 3.2 oz (65.4 kg)  10/27/16 148 lb (67.1 kg)  10/07/16 147 lb 8 oz (66.9 kg)   Physical Exam  Constitutional:  Elderly male, thin built, in no distress  HENT:  Head: Normocephalic and atraumatic.  Mouth/Throat: Oropharynx is clear and moist. No oropharyngeal exudate.  Eyes: Pupils are equal, round, and reactive to light. Conjunctivae are normal. Right eye exhibits no  discharge. Left eye exhibits no discharge.  Wears corrective lenses  Neck: Normal range of motion. Neck supple. No thyromegaly present.  Cardiovascular: Normal rate, regular rhythm and intact distal pulses.   Pulmonary/Chest: Effort normal and breath sounds normal.  Abdominal: Soft. Bowel sounds are normal. There is no tenderness. There is no guarding.  Musculoskeletal: He exhibits edema.  Kyphosis +.Can move all 4 extremities, has scoliosis, uses a walker  Lymphadenopathy:    He has no cervical adenopathy.  Neurological: He is alert.  Tremors present  Skin: Skin is warm and dry. He is not diaphoretic.  Psychiatric: He has a normal mood and affect.    Labs reviewed: Basic Metabolic Panel:  Recent Labs  02/02/16 0830 05/13/16 2021 11/15/16 0800  NA 141 140 142  K 4.4 4.1 3.9  CL 108 109 107  CO2 23 24 28   GLUCOSE 84 119* 74  BUN 37* 36* 33*  CREATININE 1.24* 1.20 1.33*  CALCIUM 9.4 8.4* 9.5   Liver Function Tests:  Recent Labs  12/20/15 1740 02/02/16 0830 05/13/16 2021 11/15/16 0800  AST 19 18 20 19   ALT 17 15 15* 21  ALKPHOS 41 42 38  --   BILITOT 0.5 0.5 0.4 0.6  PROT 6.5 7.1 5.8* 6.8  ALBUMIN 3.6 4.0 3.2*  --    No results for input(s): LIPASE, AMYLASE in the last 8760 hours. No  results for input(s): AMMONIA in the last 8760 hours. CBC:  Recent Labs  12/20/15 1740 05/13/16 2021 11/15/16 0800  WBC 6.7 4.7 5.2  NEUTROABS 5.1 3.3  --   HGB 14.1 13.1 15.2  HCT 44.1 40.2 46.5  MCV 94.6 92.6 91.5  PLT 127* 128* 178   Cardiac Enzymes: No results for input(s): CKTOTAL, CKMB, CKMBINDEX, TROPONINI in the last 8760 hours. BNP: Invalid input(s): POCBNP Lab Results  Component Value Date   HGBA1C 5.3 07/31/2012   Lab Results  Component Value Date   TSH 2.30 11/15/2016   Lab Results  Component Value Date   ZPHXTAVW97 948 08/02/2012   No results found for: FOLATE No results found for: IRON, TIBC, FERRITIN  Lipid Panel:  Recent Labs   02/02/16 0830 11/15/16 0800  CHOL 160 182  HDL 49 68  LDLCALC 95  --   TRIG 78 99  CHOLHDL 3.3 2.7   Lab Results  Component Value Date   HGBA1C 5.3 07/31/2012    Procedures since last visit: No results found.  Assessment/Plan  PVD Leg edema with chronic skin changes, dp palpable, advised to keep legs elevated at rest. Script for compression stocking provided  Unsteady gait Continue to use walker, slow position change, working with therapy team. will possibly need wheelchair for safer mobility for longer distances.   Hypothyroidism Reviewed tsh. levothyroxine 25 mcg daily  Hyperlipidemia Reviewed lipid panel. Continue atorvastatin 20 mg daily  Essential tremor Continue topamax   Chronic constipation Continue miralax and linzess   BPH Continue flomax, monitor  Macular degeneration to left eye continue bimatoprost eye drop.   Chronic depression seen by psychiatry, currently on remeron, sertraline, wellbutrin. No changes made  CKD stage 3 Monitor renal function    Labs/tests ordered:  none  Next appointment: 6 weeks for leg edema  Communication: reviewed care plan with patient    [name], MD Internal Medicine [place], [place] [address] Cell Phone (Monday-Friday 8 am - 5 pm): [phone] On Call: [phone] and follow prompts after 5 pm and on weekends Office Phone: [phone] Office Fax: [phone]

## 2016-11-17 NOTE — Telephone Encounter (Signed)
I called the patient. The MRI shows multilevel spinal stenosis and deformation of the spinal cord, worse at the C2-3 level. There is evidence of cord atrophy, no high signal in the cord. Will get a NSGY opinion concerning these findings as they relate to his worsening gait disorder.   MRI cervical 11/17/16:  IMPRESSION:  Abnormal MRI cervical spine (without) demonstrating: 1. At C2-3: disc bulging, ligamentum flavum hypertrophy, facet hypertrophy, with moderate-severe spinal stenosis and severe right foraminal stenosis  2. At C3-4: disc bulging, ligamentum flavum hypertrophy, facet hypertrophy, with moderate spinal stenosis, severe right and moderate left foraminal stenosis  3. At C4-5: disc bulging and facet hypertrophy with moderate spinal stenosis, moderate right and severe left foraminal stenosis  4. At C5-6: disc bulging and facet hypertrophy with mild spinal stenosis, moderate right and mild left foraminal stenosis  5. At C6-7: disc bulging and facet hypertrophy with severe left foraminal stenosis  6. No cord signal abnormalities noted. There is spinal cord atrophy on axial views from C3-4 to C6-7.  7. Compared to MRI on 12/06/11, there has been progression of spinal stenosis at C2-3. The other visualized levels are not significantly changed.

## 2016-11-29 ENCOUNTER — Encounter (HOSPITAL_COMMUNITY): Payer: Medicare Other

## 2016-11-29 ENCOUNTER — Encounter: Payer: Medicare Other | Admitting: Surgery

## 2016-12-10 ENCOUNTER — Telehealth: Payer: Self-pay

## 2016-12-10 NOTE — Telephone Encounter (Signed)
Left a voicemail letting the patient know that the form he requested Dr.Pandey to fill out is complete and will be left with the receptionist for pickup at his earliest convenience.

## 2017-01-03 ENCOUNTER — Ambulatory Visit (HOSPITAL_COMMUNITY)
Admission: RE | Admit: 2017-01-03 | Discharge: 2017-01-03 | Disposition: A | Discharge status: Home / Self Care | Payer: Medicare Other | Source: Ambulatory Visit | Attending: Surgery | Admitting: Surgery

## 2017-01-03 ENCOUNTER — Encounter: Payer: Medicare Other | Admitting: Surgery

## 2017-01-03 DIAGNOSIS — I739 Peripheral vascular disease, unspecified: Secondary | ICD-10-CM

## 2017-01-05 ENCOUNTER — Telehealth: Payer: Self-pay | Admitting: *Deleted

## 2017-01-05 ENCOUNTER — Ambulatory Visit (INDEPENDENT_AMBULATORY_CARE_PROVIDER_SITE_OTHER): Payer: Medicare Other | Admitting: Neurology

## 2017-01-05 ENCOUNTER — Encounter: Payer: Self-pay | Admitting: Internal Medicine

## 2017-01-05 ENCOUNTER — Encounter: Payer: Self-pay | Admitting: Neurology

## 2017-01-05 VITALS — BP 125/60 | HR 63 | Ht 72.0 in | Wt 147.0 lb

## 2017-01-05 DIAGNOSIS — G250 Essential tremor: Secondary | ICD-10-CM | POA: Diagnosis not present

## 2017-01-05 DIAGNOSIS — M4802 Spinal stenosis, cervical region: Secondary | ICD-10-CM | POA: Diagnosis not present

## 2017-01-05 DIAGNOSIS — R262 Difficulty in walking, not elsewhere classified: Secondary | ICD-10-CM

## 2017-01-05 NOTE — Telephone Encounter (Signed)
Dr. Willis- FYI 

## 2017-01-05 NOTE — Patient Instructions (Signed)
   We will try to get a neurosurgical opinion concerning the cervical spinal stenosis.

## 2017-01-05 NOTE — Telephone Encounter (Signed)
Called and spoke with Carly Biomedical engineer) at [facility]. Checking on referral sent back on 11/19/16. She states it is still under MD review. Advised it has been over a month. She states they are booking 2-3 weeks out. She transferred me to [facility] (new pt coordinator). I LVM for her to call back to see about getting pt scheduled. Gave [facility] phone number and my name for call back.

## 2017-01-05 NOTE — Telephone Encounter (Signed)
Leah returned RN's call. She said referral is still up for review. Dr Sherwood Gambler was out of the office all of October. Said she will pull it from the stack and put on the top. If any other questions please call  Thank you

## 2017-01-05 NOTE — Progress Notes (Signed)
Reason for visit: Essential tremor  Jose Leach is an 83 y.o. male  History of present illness:  Jose Leach is an 83 year old right-handed white male with a history of essential tremor affecting primarily the upper extremities.  The patient has had a change in his ability to ambulate, he is falling relatively frequently, the last fall was 1 week ago.  The patient uses a walker for ambulation.  The patient has undergone MRI evaluation of the cervical spine that does show multilevel cervical spinal stenosis that is most significant at the C2-3 and C3-4 levels, there is flattening of the spinal cord without high signal in the cord, there is atrophy of the cord seen.  A CT scan of the brain was done in March 2018 that does not show evidence of normal pressure hydrocephalus or severe cerebrovascular disease.  The patient also has noted some problems with bladder control that has developed recently.  The patient denies headache or severe neck pain, he does have some discomfort into the upper parts of the arms bilaterally.  He returns to the office today for an evaluation.  A neurosurgical referral was made in October, the patient has not heard anything about this appointment.  Past Medical History:  Diagnosis Date  . Acute urinary retention 01/28/2013  . AKI (acute kidney injury) ([place]) 01/26/2013  . Altered mental status 09/09/2013  . Anemia    B12 deficiency  . ANXIETY 06/07/2007   Qualifier: Diagnosis of  By: Talbert Cage CMA ([place]), June    . ARTHRITIS 06/07/2007   Qualifier: Diagnosis of  By: Talbert Cage CMA ([place]), June    . Balance problems 04/05/2011  . Benign essential tremor   . Benign fibroma of prostate 10/11/2011  . Bilateral inguinal hernia 10/09/2012  . Bilateral leg edema 09/13/2012  . Bladder retention 02/05/2013  . Bradycardia, sinus 08/02/2012  . CAROTID ARTERY DISEASE 03/31/2007   Qualifier: Diagnosis of  By: Linna Darner MD, Gwyndolyn Saxon    . Carotid stenosis    s/p L CEA  .  Cataract, nuclear 03/21/2014  . 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 Cervical spinal stenosis   . Cervical stenosis of spine   . Critical lower limb ischemia   . Degeneration macular 11/02/2011  . Depression    Dr Albertine Patricia  . Difficulty in walking(719.7) 04/05/2011  . ESOPHAGITIS 08/06/2002   Qualifier: Diagnosis of  By: Talbert Cage CMA ([place]), June    . Essential and other specified forms of tremor 04/18/2013  . Falls   . Family history of colon cancer 07/24/2012  . Fecal impaction ([place]) 10/11/2012  . Femoral hernia, bilateral s/p lap repair 01/16/2013 01/16/2013  . Glaucoma, compensated 03/21/2014  . H/O cardiovascular stress test    a. 02/2003 -> no ischemia/infarct.  Marland Kitchen HAMMER TOE 04/23/2010   Qualifier: Diagnosis of  By: Linna Darner MD, Gwyndolyn Saxon    . Hematoma of leg   . Hip hematoma, right 09/09/2013  . History of shingles 2009  . HTN (hypertension) 08/22/2012  . Hx of echocardiogram    Echocardiogram 08/01/12: Mild LVH, EF 55-60%, normal wall motion.  . Hydrocele 10/11/2011  . Hyperlipidemia   . 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 Hypothyroidism   . Macular degeneration disease   . Macular edema   . Migraines    Dr Jannifer Franklin  . MORTON'S NEUROMA, LEFT 08/22/2007   Qualifier: Diagnosis of  By: Linna Darner MD, Gwyndolyn Saxon    . PAD (peripheral artery disease) ([place])   . Peripheral neuropathy   . Personal history of  colonic polyps 01/05/2013   2014 2 mm adenomatous polyp   . PREMATURE ATRIAL CONTRACTIONS 03/14/2006   Qualifier: Diagnosis of  By: Linna Darner MD, Gwendolyn Lima 12/16/2011  . SPINAL STENOSIS 03/14/2006   Qualifier: Diagnosis of  By: Linna Darner MD, William   Cervical spine    . Testosterone deficiency    Dr Lawerance Bach, [facility]  . Unspecified vitamin D deficiency 08/06/2012  . Urge incontinence 10/09/2012  . Urinary tract infection, site not specified 02/13/2013  . Weakness 08/02/2012    Past Surgical History:  Procedure Laterality Date  . APPENDECTOMY  1941  . BREAST CYST EXCISION Left 01/16/2013    Procedure: MASS EXCISION AXILLA;  Surgeon: Adin Hector, MD;  Location: [place];  Service: General;  Laterality: Left;  . CAROTID ENDARTERECTOMY  2005    L  . CATARACT EXTRACTION Right   . COLONOSCOPY  2014   Dr. Deatra Ina  . EYE SURGERY Right 02/2012   retina  . INGUINAL HERNIA REPAIR Bilateral 01/16/2013   Procedure: LAPAROSCOPIC BILATERAL INGUINAL DIRECT AND INDIRECT, FEMORAL, AND OBTURATOR HERNIAS;  Surgeon: Adin Hector, MD;  Location: [place];  Service: General;  Laterality: Bilateral;  . INSERTION OF MESH N/A 01/16/2013   Procedure: INSERTION OF MESH;  Surgeon: Adin Hector, MD;  Location: [place];  Service: General;  Laterality: N/A;  . LUMBAR LAMINECTOMY    . TONSILLECTOMY AND ADENOIDECTOMY    . TOTAL HIP ARTHROPLASTY  2005   L    Family History  Problem Relation Age of Onset  . Stroke Mother 62  . Hypertension Mother   . Diabetes Mother   . Heart disease Mother   . Rectal cancer Father 54  . Heart disease Father        in 75s  . Cancer Father        colorectal  . Nephritis Sister   . Seizures Brother        died as child    Social history:  reports that he quit smoking about 37 years ago. His smoking use included pipe and cigars. He quit after 10.00 years of use. he has never used smokeless tobacco. He reports that he does not drink alcohol or use drugs.    Allergies  Allergen Reactions  . Latex Rash  . Tape Rash    Paper tape is ok to use     Medications:  Prior to Admission medications   Medication Sig Start Date End Date Taking? Authorizing Provider  ALPRAZolam Duanne Moron) 0.5 MG tablet Take 2 tablets approximately 45 minutes prior to the MRI study, take a third tablet if needed. 10/07/16   [name], MD  aspirin 81 MG tablet Take 81 mg by mouth every morning.     [provider]  atorvastatin (LIPITOR) 20 MG tablet Take one tablet by mouth once daily for cholesterol 03/02/16   Estill Dooms, MD  B Complex-C (B-COMPLEX WITH VITAMIN C) tablet  Take 1 tablet by mouth daily.    [provider]  bimatoprost (LUMIGAN) 0.01 % SOLN Place 1 drop into both eyes at bedtime.    [provider]  buPROPion (WELLBUTRIN XL) 150 MG 24 hr tablet Take 150 mg by mouth every morning. 05/22/16   [provider]  Cholecalciferol (VITAMIN D3) 1000 units CAPS Take 1,000 Units by mouth daily.    [provider]  clonazePAM (KLONOPIN) 0.5 MG tablet Take 0.25-0.5 mg by mouth at bedtime as needed for  anxiety (sleep). Take one daily for anxiety  10/14/15   [provider]  fluticasone (CUTIVATE) 0.05 % cream Apply 1 application topically 2 (two) times daily as needed (irritation).  05/15/16   [provider]  ketoconazole (NIZORAL) 2 % cream Apply 1 application topically 2 (two) times daily as needed for irritation.  05/15/16   [provider]  Javier Docker Oil 500 MG CAPS Take 500 mg by mouth every morning.     [provider]  levothyroxine (SYNTHROID) 50 MCG tablet Take 1/2 tablet by mouth on Sunday mornings and Take one tablet by mouth every other morning for thryoid. 05/16/15   Estill Dooms, MD  linaclotide Rolan Lipa) 145 MCG CAPS capsule Take 1 capsule (145 mcg total) by mouth daily before breakfast. 09/01/16   [name], MD  mirtazapine (REMERON) 30 MG tablet Take 30 mg by mouth at bedtime.  02/16/16   [provider]  Multiple Vitamins-Minerals (ICAPS PO) Take 1 tablet by mouth daily.    [provider]  polyethylene glycol (MIRALAX / GLYCOLAX) packet Take 17 g by mouth daily as needed for mild constipation.    [provider]  sertraline (ZOLOFT) 100 MG tablet Take 200 mg by mouth every morning.     [provider]  tamsulosin (FLOMAX) 0.4 MG CAPS capsule TAKE 1 CAPSULE (0.4 MG TOTAL) BY MOUTH DAILY. 05/24/14   [provider]  topiramate (TOPAMAX) 25 MG tablet TAKE ONE TABLET BY MOUTH EVERY MORNING AND 2 TABLETS IN THE EVENING 01/01/16   Dennie Bible, NP    ROS:  Out of a complete 14 system review of symptoms, the patient complains only of the following symptoms, and all other reviewed systems are negative.  Tremors Gait disturbance, falls  Blood pressure 125/60, pulse 63, height 6' (1.829 m), weight 147 lb (66.7 kg), SpO2 97 %.  Physical Exam  General: The patient is alert and cooperative at the time of the examination.  Skin: No significant peripheral edema is noted.   Neurologic Exam  Mental status: The patient is alert and oriented x 3 at the time of the examination. The patient has apparent normal recent and remote memory, with an apparently normal attention span and concentration ability.   Cranial nerves: Facial symmetry is present. Speech is hesitant, associated with a vocal tremor. Extraocular movements are full. Visual fields are full.  Motor: The patient has good strength in all 4 extremities.  Sensory examination: Soft touch sensation is symmetric on the face, arms, and legs.  Coordination: The patient has good finger-nose-finger and heel-to-shin bilaterally.  Gait and station: The patient has a a lot of difficulty getting up from a chair.  Once up, he can walk slowly with a walker.  Gait is slightly wide-based.  Tandem gait was not attempted.  Romberg is negative. No drift is seen.  Reflexes: Deep tendon reflexes are symmetric.   MRI cervical 11/17/16:  IMPRESSION:  Abnormal MRI cervical spine (without) demonstrating: 1. At C2-3: disc bulging, ligamentum flavum hypertrophy, facet hypertrophy, with moderate-severe spinal stenosis and severe right foraminal stenosis  2. At C3-4: disc bulging, ligamentum flavum hypertrophy, facet hypertrophy, with moderate spinal stenosis, severe right and moderate left foraminal stenosis  3. At C4-5: disc bulging and facet hypertrophy with moderate spinal stenosis, moderate right and severe left foraminal stenosis  4. At C5-6: disc bulging and facet hypertrophy with  mild spinal stenosis, moderate right and mild left foraminal stenosis  5. At C6-7: disc bulging and  facet hypertrophy with severe left foraminal stenosis  6. No cord signal abnormalities noted. There is spinal cord atrophy on axial views from C3-4 to C6-7.  7. Compared to MRI on 12/06/11, there has been progression of spinal stenosis at C2-3. The other visualized levels are not significantly changed.  * MRI scan images were reviewed online. I agree with the written report.    Assessment/Plan:  1.  Essential tremor  2.  Gait disorder  3.  Cervical spinal stenosis  The patient does have spinal stenosis in the cervical area with some flattening of the spinal cord.  This may be at least in part an etiology for some of his progressive gait problems and bladder control issues.  The patient has been sent for a neurosurgical opinion regarding decompression of the spinal stenosis, the patient has not heard anything about this, we will make another referral if needed.  The patient has undergone some physical therapy he claims within the last several months for gait training.  He will follow-up in 6 months.  He will continue the Topamax for the tremor.  Jill Alexanders MD 01/05/2017 11:12 AM  [facility] [address] [place], [place] [address]  Phone [phone] Fax [phone]

## 2017-01-06 DIAGNOSIS — Y92009 Unspecified place in unspecified non-institutional (private) residence as the place of occurrence of the external cause: Secondary | ICD-10-CM

## 2017-01-06 DIAGNOSIS — W19XXXA Unspecified fall, initial encounter: Secondary | ICD-10-CM

## 2017-01-06 HISTORY — DX: Unspecified place in unspecified non-institutional (private) residence as the place of occurrence of the external cause: Y92.009

## 2017-01-06 HISTORY — DX: Unspecified place in unspecified non-institutional (private) residence as the place of occurrence of the external cause: W19.XXXA

## 2017-01-08 ENCOUNTER — Emergency Department (HOSPITAL_COMMUNITY)
Admission: EM | Admit: 2017-01-08 | Discharge: 2017-01-08 | Disposition: A | Discharge status: Home / Self Care | Payer: Medicare Other | Attending: Emergency Medicine | Admitting: Emergency Medicine

## 2017-01-08 ENCOUNTER — Emergency Department (HOSPITAL_COMMUNITY): Payer: Medicare Other

## 2017-01-08 ENCOUNTER — Encounter (HOSPITAL_COMMUNITY): Payer: Self-pay

## 2017-01-08 DIAGNOSIS — Z91040 Latex allergy status: Secondary | ICD-10-CM | POA: Insufficient documentation

## 2017-01-08 DIAGNOSIS — Z96642 Presence of left artificial hip joint: Secondary | ICD-10-CM | POA: Insufficient documentation

## 2017-01-08 DIAGNOSIS — I129 Hypertensive chronic kidney disease with stage 1 through stage 4 chronic kidney disease, or unspecified chronic kidney disease: Secondary | ICD-10-CM | POA: Insufficient documentation

## 2017-01-08 DIAGNOSIS — M25552 Pain in left hip: Secondary | ICD-10-CM | POA: Insufficient documentation

## 2017-01-08 DIAGNOSIS — N183 Chronic kidney disease, stage 3 (moderate): Secondary | ICD-10-CM | POA: Diagnosis not present

## 2017-01-08 DIAGNOSIS — Z79899 Other long term (current) drug therapy: Secondary | ICD-10-CM | POA: Diagnosis not present

## 2017-01-08 DIAGNOSIS — Z87891 Personal history of nicotine dependence: Secondary | ICD-10-CM | POA: Insufficient documentation

## 2017-01-08 DIAGNOSIS — W19XXXA Unspecified fall, initial encounter: Secondary | ICD-10-CM

## 2017-01-08 DIAGNOSIS — E039 Hypothyroidism, unspecified: Secondary | ICD-10-CM | POA: Insufficient documentation

## 2017-01-08 LAB — BASIC METABOLIC PANEL
ANION GAP: 6 (ref 5–15)
BUN: 35 mg/dL — AB (ref 6–20)
CO2: 23 mmol/L (ref 22–32)
Calcium: 8.7 mg/dL — ABNORMAL LOW (ref 8.9–10.3)
Chloride: 111 mmol/L (ref 101–111)
Creatinine, Ser: 1.26 mg/dL — ABNORMAL HIGH (ref 0.61–1.24)
GFR, EST AFRICAN AMERICAN: 60 mL/min — AB (ref >60)
GFR, EST NON AFRICAN AMERICAN: 51 mL/min — AB (ref >60)
Glucose, Bld: 95 mg/dL (ref 65–99)
POTASSIUM: 4.1 mmol/L (ref 3.5–5.1)
SODIUM: 140 mmol/L (ref 135–145)

## 2017-01-08 LAB — CBC WITH DIFFERENTIAL/PLATELET
BASOS ABS: 0 10*3/uL (ref 0.0–0.1)
Basophils Relative: 0 %
EOS PCT: 1 %
Eosinophils Absolute: 0.1 10*3/uL (ref 0.0–0.7)
HCT: 38.6 % — ABNORMAL LOW (ref 39.0–52.0)
Hemoglobin: 12.4 g/dL — ABNORMAL LOW (ref 13.0–17.0)
LYMPHS PCT: 18 %
Lymphs Abs: 1.2 10*3/uL (ref 0.7–4.0)
MCH: 31 pg (ref 26.0–34.0)
MCHC: 32.1 g/dL (ref 30.0–36.0)
MCV: 96.5 fL (ref 78.0–100.0)
Monocytes Absolute: 0.8 10*3/uL (ref 0.1–1.0)
Monocytes Relative: 12 %
NEUTROS PCT: 69 %
Neutro Abs: 4.6 10*3/uL (ref 1.7–7.7)
PLATELETS: 141 10*3/uL — AB (ref 150–400)
RBC: 4 MIL/uL — AB (ref 4.22–5.81)
RDW: 14.1 % (ref 11.5–15.5)
WBC: 6.6 10*3/uL (ref 4.0–10.5)

## 2017-01-08 NOTE — ED Provider Notes (Signed)
  Face-to-face evaluation   History: He complains of fall with injury to left hip making it difficult to walk.  No other injuries.  He typically uses a walker for ambulation.  Physical exam: Alert, elderly male.  Mild tremors present.  Tender left pelvic region, without localized left hip tenderness or deformity.  Medical screening examination/treatment/procedure(s) were conducted as a shared visit with non-physician practitioner(s) and myself.  I personally evaluated the patient during the encounter    Daleen Bo, MD 01/09/17 1558

## 2017-01-08 NOTE — ED Notes (Signed)
Attempted to ambulate pt with a walker.  Pt was able to sit up in bed but was unable to stand up.  Pt felt like he was going to slip as he was attempting to get up. Pt was very unstable and the wife reports that pt has been unstable at home as well.  Wife also reports that pt has been having increasing number of falls at home and that she is unable to take care of him by herself. PA made aware.

## 2017-01-08 NOTE — ED Triage Notes (Addendum)
BIB EMS from [facility] w/ c/o of left hip pain, post mechanical fall yesterday. No deformity or shortening observed. Pt denies hitting his head.

## 2017-01-08 NOTE — ED Notes (Signed)
Bed: [facility] Expected date: 01/08/17 Expected time: 12:54 PM Means of arrival: Ambulance Comments: Fall, yesterday, hip pain today

## 2017-01-08 NOTE — Discharge Instructions (Signed)
Continue your home medications as previously prescribed. Return to ED for worsening pain, falls, numbness in legs, head injuries, vision changes.

## 2017-01-08 NOTE — ED Provider Notes (Signed)
[facility] DEPT Provider Note   CSN: [id number] Arrival date & time: 01/08/17  1303     History   Chief Complaint Chief Complaint  Patient presents with  . Fall  . Hip Pain    Left    HPI Jose Leach is a 83 y.o. with a past medical history of arthritis, hypertension, CAD, bradycardia, who presents to ED for evaluation of left hip pain after fall that occurred 2 days ago.  He states that he was in the kitchen taking his medications on Thanksgiving night when he "slipped"and landed on his hip.  He does have a history of hip replacement on the left side over 20 years ago.  He has been ambulatory since the accident but with pain.  He denies any head injury, loss of consciousness, back pain, vision changes, numbness in legs.  HPI  Past Medical History:  Diagnosis Date  . Acute urinary retention 01/28/2013  . AKI (acute kidney injury) ([place]) 01/26/2013  . Altered mental status 09/09/2013  . Anemia    B12 deficiency  . ANXIETY 06/07/2007   Qualifier: Diagnosis of  By: Talbert Cage CMA ([place]), June    . ARTHRITIS 06/07/2007   Qualifier: Diagnosis of  By: Talbert Cage CMA ([place]), June    . Balance problems 04/05/2011  . Benign essential tremor   . Benign fibroma of prostate 10/11/2011  . Bilateral inguinal hernia 10/09/2012  . Bilateral leg edema 09/13/2012  . Bladder retention 02/05/2013  . Bradycardia, sinus 08/02/2012  . CAROTID ARTERY DISEASE 03/31/2007   Qualifier: Diagnosis of  By: Linna Darner MD, Gwyndolyn Saxon    . Carotid stenosis    s/p L CEA  . Cataract, nuclear 03/21/2014  . 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 Cervical spinal stenosis   . Cervical stenosis of spine   . Critical lower limb ischemia   . Degeneration macular 11/02/2011  . Depression    Dr Albertine Patricia  . Difficulty in walking(719.7) 04/05/2011  . ESOPHAGITIS 08/06/2002   Qualifier: Diagnosis of  By: Talbert Cage CMA ([place]), June    . Essential and other specified forms of tremor 04/18/2013  . Falls    . Family history of colon cancer 07/24/2012  . Fecal impaction ([place]) 10/11/2012  . Femoral hernia, bilateral s/p lap repair 01/16/2013 01/16/2013  . Glaucoma, compensated 03/21/2014  . H/O cardiovascular stress test    a. 02/2003 -> no ischemia/infarct.  Marland Kitchen HAMMER TOE 04/23/2010   Qualifier: Diagnosis of  By: Linna Darner MD, Gwyndolyn Saxon    . Hematoma of leg   . Hip hematoma, right 09/09/2013  . History of shingles 2009  . HTN (hypertension) 08/22/2012  . Hx of echocardiogram    Echocardiogram 08/01/12: Mild LVH, EF 55-60%, normal wall motion.  . Hydrocele 10/11/2011  . Hyperlipidemia   . 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 Hypothyroidism   . Macular degeneration disease   . Macular edema   . Migraines    Dr Jannifer Franklin  . MORTON'S NEUROMA, LEFT 08/22/2007   Qualifier: Diagnosis of  By: Linna Darner MD, Gwyndolyn Saxon    . PAD (peripheral artery disease) ([place])   . Peripheral neuropathy   . Personal history of colonic polyps 01/05/2013   2014 2 mm adenomatous polyp   . PREMATURE ATRIAL CONTRACTIONS 03/14/2006   Qualifier: Diagnosis of  By: Linna Darner MD, Gwendolyn Lima 12/16/2011  . SPINAL STENOSIS 03/14/2006   Qualifier: Diagnosis of  By: Linna Darner MD, William   Cervical spine    . Testosterone deficiency  Dr Lawerance Bach, [facility]  . Unspecified vitamin D deficiency 08/06/2012  . Urge incontinence 10/09/2012  . Urinary tract infection, site not specified 02/13/2013  . Weakness 08/02/2012    Patient Active Problem List   Diagnosis Date Noted  . Major depression, recurrent, chronic ([place]) 11/17/2016  . CKD (chronic kidney disease) stage 3, GFR 30-59 ml/min ([facility]) 11/17/2016  . History of fall 06/22/2016  . Hyperglycemia 06/22/2016  . Elevated TSH 06/22/2016  . CKD (chronic kidney disease) stage 2, GFR 60-89 ml/min 06/22/2016  . Edema 12/23/2015  . Callus 07/23/2014  . Fall 07/09/2014  . Depression 07/09/2014  . Pain in left hip 05/22/2014  . AMD (age-related macular degeneration), wet ([place]) 03/21/2014  .  Cataract, nuclear 03/21/2014  . Glaucoma, compensated 03/21/2014  . Status post intraocular lens implant 03/21/2014  . Benign prostatic hyperplasia with urinary obstruction 12/27/2013  . Knee pain, right 09/25/2013  . Bilateral buttock pain 09/18/2013  . Other testicular hypofunction 09/14/2013  . Hip hematoma, right 09/09/2013  . Altered mental status 09/09/2013  . Benign essential tremor 04/18/2013  . 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 Bladder retention 02/05/2013  . Renal cyst, left 01/30/2013  . Femoral hernia, bilateral s/p lap repair 01/16/2013 01/16/2013  . Personal history of colonic polyps 01/05/2013  . Fecal impaction ([place]) 10/11/2012  . Bilateral inguinal hernia 10/09/2012  . Urge incontinence 10/09/2012  . Bilateral leg edema 09/13/2012  . HTN (hypertension) 08/22/2012  . Unspecified vitamin D deficiency 08/06/2012  . Bradycardia, sinus 08/02/2012  . Constipation 07/24/2012  . Family history of colon cancer 07/24/2012  . Pseudoaphakia 12/16/2011  . Degeneration macular 11/02/2011  . Benign prostatic hyperplasia without lower urinary tract symptoms 10/11/2011  . Bladder neck obstruction 10/11/2011  . Hydrocele in adult 10/11/2011  . 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 Difficulty walking 04/05/2011  . Balance problems 04/05/2011  . Hammer toe 04/23/2010  . Unsteady gait 11/12/2009  . MORTON'S NEUROMA, LEFT 08/22/2007  . Anxiety state 06/07/2007  . ARTHRITIS 06/07/2007  . CAROTID ARTERY DISEASE 03/31/2007  . PVD (peripheral vascular disease) ([place]) 03/16/2007  . Hypothyroidism 11/10/2006  . HYPERLIPIDEMIA 11/10/2006  . MIGRAINE HEADACHE 03/14/2006  . PREMATURE ATRIAL CONTRACTIONS 03/14/2006  . Cervical spinal stenosis 03/14/2006  . ESOPHAGITIS 08/06/2002    Past Surgical History:  Procedure Laterality Date  . APPENDECTOMY  1941  . BREAST CYST EXCISION Left 01/16/2013   Procedure: MASS EXCISION AXILLA;  Surgeon: Adin Hector, MD;  Location: [facility];   Service: General;  Laterality: Left;  . CAROTID ENDARTERECTOMY  2005    L  . CATARACT EXTRACTION Right   . COLONOSCOPY  2014   Dr. Deatra Ina  . EYE SURGERY Right 02/2012   retina  . INGUINAL HERNIA REPAIR Bilateral 01/16/2013   Procedure: LAPAROSCOPIC BILATERAL INGUINAL DIRECT AND INDIRECT, FEMORAL, AND OBTURATOR HERNIAS;  Surgeon: Adin Hector, MD;  Location: [facility];  Service: General;  Laterality: Bilateral;  . INSERTION OF MESH N/A 01/16/2013   Procedure: INSERTION OF MESH;  Surgeon: Adin Hector, MD;  Location: [facility];  Service: General;  Laterality: N/A;  . LUMBAR LAMINECTOMY    . TONSILLECTOMY AND ADENOIDECTOMY    . TOTAL HIP ARTHROPLASTY  2005   L       Home Medications    Prior to Admission medications   Medication Sig Start Date End Date Taking? Authorizing Provider  aspirin 81 MG tablet Take 81 mg by mouth every morning.     [provider]  atorvastatin (  LIPITOR) 20 MG tablet Take one tablet by mouth once daily for cholesterol 03/02/16   Estill Dooms, MD  B Complex-C (B-COMPLEX WITH VITAMIN C) tablet Take 1 tablet by mouth daily.    [provider]  bimatoprost (LUMIGAN) 0.01 % SOLN Place 1 drop into both eyes at bedtime.    [provider]  buPROPion (WELLBUTRIN XL) 150 MG 24 hr tablet Take 150 mg by mouth every morning. 05/22/16   [provider]  Cholecalciferol (VITAMIN D3) 1000 units CAPS Take 1,000 Units by mouth daily.    [provider]  clonazePAM (KLONOPIN) 0.5 MG tablet Take 0.25-0.5 mg by mouth at bedtime as needed for anxiety (sleep). Take one daily for anxiety  10/14/15   [provider]  fluticasone (CUTIVATE) 0.05 % cream Apply 1 application topically 2 (two) times daily as needed (irritation).  05/15/16   [provider]  ketoconazole (NIZORAL) 2 % cream Apply 1 application topically 2 (two) times daily as needed for irritation.  05/15/16   [provider]  Javier Docker Oil 500 MG CAPS Take 500  mg by mouth every morning.     [provider]  levothyroxine (SYNTHROID) 50 MCG tablet Take 1/2 tablet by mouth on Sunday mornings and Take one tablet by mouth every other morning for thryoid. 05/16/15   Estill Dooms, MD  linaclotide Rolan Lipa) 145 MCG CAPS capsule Take 1 capsule (145 mcg total) by mouth daily before breakfast. 09/01/16   [name], MD  mirtazapine (REMERON) 30 MG tablet Take 30 mg by mouth at bedtime.  02/16/16   [provider]  Multiple Vitamins-Minerals (ICAPS PO) Take 1 tablet by mouth daily.    [provider]  polyethylene glycol (MIRALAX / GLYCOLAX) packet Take 17 g by mouth daily as needed for mild constipation.    [provider]  sertraline (ZOLOFT) 100 MG tablet Take 200 mg by mouth every morning.     [provider]  tamsulosin (FLOMAX) 0.4 MG CAPS capsule TAKE 1 CAPSULE (0.4 MG TOTAL) BY MOUTH DAILY. 05/24/14   [provider]  topiramate (TOPAMAX) 25 MG tablet TAKE ONE TABLET BY MOUTH EVERY MORNING AND 2 TABLETS IN THE EVENING 01/01/16   Dennie Bible, NP    Family History Family History  Problem Relation Age of Onset  . Stroke Mother 23  . Hypertension Mother   . Diabetes Mother   . Heart disease Mother   . Rectal cancer Father 62  . Heart disease Father        in 92s  . Cancer Father        colorectal  . Nephritis Sister   . Seizures Brother        died as child    Social History Social History   Tobacco Use  . Smoking status: Former Smoker    Years: 10.00    Types: Pipe, Cigars    Last attempt to quit: 02/16/1979    Years since quitting: 37.9  . Smokeless tobacco: Never Used  . Tobacco comment: Quit about age 25   Substance Use Topics  . Alcohol use: No    Alcohol/week: 2.4 oz    Types: 4 Standard drinks or equivalent per week  . Drug use: No     Allergies   Latex and Tape   Review of Systems Review of Systems  Constitutional: Negative for appetite change, chills and  fever.  HENT: Negative for ear pain, rhinorrhea, sneezing and sore throat.  Eyes: Negative for photophobia and visual disturbance.  Respiratory: Negative for cough, chest tightness, shortness of breath and wheezing.   Cardiovascular: Negative for chest pain and palpitations.  Gastrointestinal: Negative for abdominal pain, blood in stool, constipation, diarrhea, nausea and vomiting.  Genitourinary: Negative for dysuria, hematuria and urgency.  Musculoskeletal: Positive for arthralgias and gait problem. Negative for myalgias.  Skin: Negative for rash.  Neurological: Negative for dizziness, weakness and light-headedness.     Physical Exam Updated Vital Signs BP (!) 134/55 (BP Location: Right Arm)   Pulse (!) 44   Temp 98.7 F (37.1 C) (Oral)   Resp 16   SpO2 99%   Physical Exam  Constitutional: He appears well-developed and well-nourished. No distress.  HENT:  Head: Normocephalic and atraumatic.  Nose: Nose normal.  Eyes: Conjunctivae and EOM are normal. Left eye exhibits no discharge. No scleral icterus.  Neck: Normal range of motion. Neck supple.  Cardiovascular: Regular rhythm, normal heart sounds and intact distal pulses. Bradycardia present. Exam reveals no gallop and no friction rub.  No murmur heard. Pulmonary/Chest: Effort normal and breath sounds normal. No respiratory distress.  Abdominal: Soft. Bowel sounds are normal. He exhibits no distension. There is no tenderness. There is no guarding.  Musculoskeletal: Normal range of motion. He exhibits tenderness. He exhibits no edema or deformity.  Tenderness to palpation of the left hip.  No visible deformity.  Pain with flexion and extension of the left knee.   Neurological: He is alert. He exhibits normal muscle tone. Coordination normal.  Skin: Skin is warm and dry. No rash noted.  Psychiatric: He has a normal mood and affect.  Nursing note and vitals reviewed.    ED Treatments / Results  Labs (all labs ordered are  listed, but only abnormal results are displayed) Labs Reviewed  BASIC METABOLIC PANEL - Abnormal; Notable for the following components:      Result Value   BUN 35 (*)    Creatinine, Ser 1.26 (*)    Calcium 8.7 (*)    GFR calc non Af Amer 51 (*)    GFR calc Af Amer 60 (*)    All other components within normal limits  CBC WITH DIFFERENTIAL/PLATELET - Abnormal; Notable for the following components:   RBC 4.00 (*)    Hemoglobin 12.4 (*)    HCT 38.6 (*)    Platelets 141 (*)    All other components within normal limits    EKG  EKG Interpretation None       Radiology Dg Hip Unilat With Pelvis 2-3 Views Left  Result Date: 01/08/2017 CLINICAL DATA:  [place] yesterday onto left hip. Complains of pain since fall. Left hip arthroplasty. EXAM: DG HIP (WITH OR WITHOUT PELVIS) 2-3V LEFT COMPARISON:  08/09/2016 FINDINGS: Left hip arthroplasty is located. The entire left femoral stem is visualized without a periprosthetic fracture. Pelvic bony ring is intact. Again noted is severe joint space narrowing along the superior right hip joint. Degenerative disc and endplate changes in the lower lumbar spine. IMPRESSION: No acute abnormality to the pelvis or left hip. Severe osteoarthritic changes in the right hip. Electronically Signed   By: Markus Daft M.D.   On: 01/08/2017 13:57    Procedures Procedures (including critical care time)  Medications Ordered in ED Medications - No data to display   Initial Impression / Assessment and Plan / ED Course  I have reviewed the triage vital signs and the nursing notes.  Pertinent labs & imaging results that were available  during my care of the patient were reviewed by me and considered in my medical decision making (see chart for details).     Patient presents to ED for evaluation of left hip pain after fall 2 days ago.  He does have a history of hip replacement on the left side over 20 years ago.  He has been ambulatory since the incident but reports  pain.  On physical exam there is tenderness to palpation of the left hip with no visible deformity or changes in temperature.  He is otherwise nontoxic appearing and in no acute distress.  He denies any head injury, loss of consciousness, back pain or other symptoms.  He is not on blood thinners.  X-ray of the hip returned as negative.  CBC is unremarkable.  BMP with elevation in BUN and creatinine assistant with patient known history of CKD.   Nurse states that patient tried ambulating but was very shaky when trying to stand up from bed with walker placed in front of him. She states he has been slipping more lately.  Spoke to nurse at [facility] states that they can transfer the patient to the skilled nursing portion of the facility.  After I made multiple phone calls, his wife states that "I can just take care of him in the independent portion like we are now."  She did voice some concern for me at the beginning but has since changed her mind.  I spoke to the nurse in the facility and states that they will work together with the patient for his best placement.  Patient appears stable for discharge at this time.  Strict return precautions given.  Patient discussed with and seen by Dr. Eulis Foster.  Final Clinical Impressions(s) / ED Diagnoses   Final diagnoses:  None    ED Discharge Orders    None       Delia Heady, PA-C 01/08/17 1801    Daleen Bo, MD 01/09/17 1558

## 2017-01-08 NOTE — ED Notes (Signed)
Unable to collect labs patient is in xray 

## 2017-01-10 ENCOUNTER — Non-Acute Institutional Stay (SKILLED_NURSING_FACILITY): Payer: Medicare Other | Admitting: Internal Medicine

## 2017-01-10 ENCOUNTER — Emergency Department (HOSPITAL_COMMUNITY): Payer: Medicare Other

## 2017-01-10 ENCOUNTER — Observation Stay (HOSPITAL_COMMUNITY)
Admission: EM | Admit: 2017-01-10 | Discharge: 2017-01-12 | Disposition: A | Discharge status: Skilled Nursing Facility | Payer: Medicare Other | Attending: Internal Medicine | Admitting: Internal Medicine

## 2017-01-10 ENCOUNTER — Encounter (HOSPITAL_COMMUNITY): Payer: Self-pay | Admitting: *Deleted

## 2017-01-10 ENCOUNTER — Other Ambulatory Visit: Payer: Self-pay

## 2017-01-10 ENCOUNTER — Encounter: Payer: Self-pay | Admitting: Internal Medicine

## 2017-01-10 ENCOUNTER — Inpatient Hospital Stay (HOSPITAL_COMMUNITY): Payer: Medicare Other

## 2017-01-10 DIAGNOSIS — G8194 Hemiplegia, unspecified affecting left nondominant side: Secondary | ICD-10-CM | POA: Diagnosis not present

## 2017-01-10 DIAGNOSIS — R262 Difficulty in walking, not elsewhere classified: Secondary | ICD-10-CM | POA: Diagnosis not present

## 2017-01-10 DIAGNOSIS — Y92099 Unspecified place in other non-institutional residence as the place of occurrence of the external cause: Secondary | ICD-10-CM | POA: Diagnosis not present

## 2017-01-10 DIAGNOSIS — M25559 Pain in unspecified hip: Secondary | ICD-10-CM | POA: Diagnosis present

## 2017-01-10 DIAGNOSIS — E785 Hyperlipidemia, unspecified: Secondary | ICD-10-CM | POA: Insufficient documentation

## 2017-01-10 DIAGNOSIS — I129 Hypertensive chronic kidney disease with stage 1 through stage 4 chronic kidney disease, or unspecified chronic kidney disease: Secondary | ICD-10-CM | POA: Diagnosis not present

## 2017-01-10 DIAGNOSIS — Z9181 History of falling: Secondary | ICD-10-CM | POA: Diagnosis not present

## 2017-01-10 DIAGNOSIS — F419 Anxiety disorder, unspecified: Secondary | ICD-10-CM | POA: Insufficient documentation

## 2017-01-10 DIAGNOSIS — B356 Tinea cruris: Secondary | ICD-10-CM | POA: Diagnosis not present

## 2017-01-10 DIAGNOSIS — M25552 Pain in left hip: Secondary | ICD-10-CM | POA: Insufficient documentation

## 2017-01-10 DIAGNOSIS — I739 Peripheral vascular disease, unspecified: Secondary | ICD-10-CM | POA: Diagnosis not present

## 2017-01-10 DIAGNOSIS — F329 Major depressive disorder, single episode, unspecified: Secondary | ICD-10-CM | POA: Diagnosis not present

## 2017-01-10 DIAGNOSIS — R159 Full incontinence of feces: Secondary | ICD-10-CM

## 2017-01-10 DIAGNOSIS — R32 Unspecified urinary incontinence: Secondary | ICD-10-CM | POA: Diagnosis not present

## 2017-01-10 DIAGNOSIS — R001 Bradycardia, unspecified: Secondary | ICD-10-CM | POA: Diagnosis not present

## 2017-01-10 DIAGNOSIS — S32445A Nondisplaced fracture of posterior column [ilioischial] of left acetabulum, initial encounter for closed fracture: Principal | ICD-10-CM | POA: Insufficient documentation

## 2017-01-10 DIAGNOSIS — E46 Unspecified protein-calorie malnutrition: Secondary | ICD-10-CM | POA: Diagnosis not present

## 2017-01-10 DIAGNOSIS — R531 Weakness: Secondary | ICD-10-CM | POA: Diagnosis present

## 2017-01-10 DIAGNOSIS — G9340 Encephalopathy, unspecified: Secondary | ICD-10-CM | POA: Insufficient documentation

## 2017-01-10 DIAGNOSIS — E039 Hypothyroidism, unspecified: Secondary | ICD-10-CM | POA: Diagnosis present

## 2017-01-10 DIAGNOSIS — W010XXA Fall on same level from slipping, tripping and stumbling without subsequent striking against object, initial encounter: Secondary | ICD-10-CM | POA: Diagnosis not present

## 2017-01-10 DIAGNOSIS — D649 Anemia, unspecified: Secondary | ICD-10-CM

## 2017-01-10 DIAGNOSIS — N183 Chronic kidney disease, stage 3 (moderate): Secondary | ICD-10-CM | POA: Diagnosis not present

## 2017-01-10 DIAGNOSIS — M4802 Spinal stenosis, cervical region: Secondary | ICD-10-CM | POA: Insufficient documentation

## 2017-01-10 HISTORY — DX: Unspecified place in unspecified non-institutional (private) residence as the place of occurrence of the external cause: Y92.009

## 2017-01-10 HISTORY — DX: Unspecified fall, initial encounter: W19.XXXA

## 2017-01-10 LAB — CBC WITH DIFFERENTIAL/PLATELET
BASOS ABS: 0 10*3/uL (ref 0.0–0.1)
BASOS PCT: 0 %
EOS PCT: 1 %
Eosinophils Absolute: 0.1 10*3/uL (ref 0.0–0.7)
HEMATOCRIT: 39.3 % (ref 39.0–52.0)
Hemoglobin: 12.4 g/dL — ABNORMAL LOW (ref 13.0–17.0)
Lymphocytes Relative: 15 %
Lymphs Abs: 1 10*3/uL (ref 0.7–4.0)
MCH: 30.7 pg (ref 26.0–34.0)
MCHC: 31.6 g/dL (ref 30.0–36.0)
MCV: 97.3 fL (ref 78.0–100.0)
MONO ABS: 0.5 10*3/uL (ref 0.1–1.0)
Monocytes Relative: 8 %
NEUTROS ABS: 5.3 10*3/uL (ref 1.7–7.7)
Neutrophils Relative %: 76 %
PLATELETS: 152 10*3/uL (ref 150–400)
RBC: 4.04 MIL/uL — AB (ref 4.22–5.81)
RDW: 14.4 % (ref 11.5–15.5)
WBC: 6.9 10*3/uL (ref 4.0–10.5)

## 2017-01-10 LAB — COMPREHENSIVE METABOLIC PANEL
ALBUMIN: 3.2 g/dL — AB (ref 3.5–5.0)
ALT: 17 U/L (ref 17–63)
AST: 18 U/L (ref 15–41)
Alkaline Phosphatase: 38 U/L (ref 38–126)
Anion gap: 7 (ref 5–15)
BUN: 30 mg/dL — AB (ref 6–20)
CALCIUM: 8.8 mg/dL — AB (ref 8.9–10.3)
CHLORIDE: 112 mmol/L — AB (ref 101–111)
CO2: 21 mmol/L — ABNORMAL LOW (ref 22–32)
CREATININE: 1.13 mg/dL (ref 0.61–1.24)
GFR calc Af Amer: >60 mL/min (ref >60)
GFR calc non Af Amer: 59 mL/min — ABNORMAL LOW (ref >60)
GLUCOSE: 87 mg/dL (ref 65–99)
POTASSIUM: 4.8 mmol/L (ref 3.5–5.1)
Sodium: 140 mmol/L (ref 135–145)
Total Bilirubin: 0.7 mg/dL (ref 0.3–1.2)
Total Protein: 6.2 g/dL — ABNORMAL LOW (ref 6.5–8.1)

## 2017-01-10 LAB — URINALYSIS, ROUTINE W REFLEX MICROSCOPIC
BILIRUBIN URINE: NEGATIVE
GLUCOSE, UA: NEGATIVE mg/dL
Hgb urine dipstick: NEGATIVE
KETONES UR: NEGATIVE mg/dL
LEUKOCYTES UA: NEGATIVE
NITRITE: NEGATIVE
PH: 6 (ref 5.0–8.0)
PROTEIN: NEGATIVE mg/dL
Specific Gravity, Urine: 1.026 (ref 1.005–1.030)

## 2017-01-10 MED ORDER — B COMPLEX-C PO TABS
1.0000 | ORAL_TABLET | Freq: Every day | ORAL | Status: DC
  Administered 2017-01-11 – 2017-01-12 (×2): 1 via ORAL
  Filled 2017-01-10 (×2): qty 1

## 2017-01-10 MED ORDER — OMEGA-3-ACID ETHYL ESTERS 1 G PO CAPS
1000.0000 mg | ORAL_CAPSULE | Freq: Every morning | ORAL | Status: DC
  Administered 2017-01-11 – 2017-01-12 (×2): 1000 mg via ORAL
  Filled 2017-01-10 (×2): qty 1

## 2017-01-10 MED ORDER — MIDAZOLAM HCL 2 MG/2ML IJ SOLN
1.0000 mg | INTRAMUSCULAR | Status: DC | PRN
  Administered 2017-01-10: 1 mg via INTRAVENOUS
  Filled 2017-01-10 (×2): qty 2

## 2017-01-10 MED ORDER — BUPROPION HCL ER (XL) 150 MG PO TB24
150.0000 mg | ORAL_TABLET | ORAL | Status: DC
  Administered 2017-01-11 – 2017-01-12 (×2): 150 mg via ORAL
  Filled 2017-01-10 (×2): qty 1

## 2017-01-10 MED ORDER — VITAMIN D 1000 UNITS PO TABS
1000.0000 [IU] | ORAL_TABLET | Freq: Every day | ORAL | Status: DC
  Administered 2017-01-11 – 2017-01-12 (×2): 1000 [IU] via ORAL
  Filled 2017-01-10 (×2): qty 1

## 2017-01-10 MED ORDER — CLONAZEPAM 0.5 MG PO TABS
0.2500 mg | ORAL_TABLET | Freq: Every evening | ORAL | Status: DC | PRN
  Administered 2017-01-11 (×2): 0.25 mg via ORAL
  Filled 2017-01-10 (×2): qty 1

## 2017-01-10 MED ORDER — PRO-STAT SUGAR FREE PO LIQD
30.0000 mL | Freq: Two times a day (BID) | ORAL | Status: DC
  Administered 2017-01-11 – 2017-01-12 (×3): 30 mL via ORAL
  Filled 2017-01-10 (×3): qty 30

## 2017-01-10 MED ORDER — SERTRALINE HCL 100 MG PO TABS
200.0000 mg | ORAL_TABLET | Freq: Every day | ORAL | Status: DC
  Administered 2017-01-11 – 2017-01-12 (×2): 200 mg via ORAL
  Filled 2017-01-10 (×2): qty 2

## 2017-01-10 MED ORDER — TOPIRAMATE 25 MG PO TABS: 25.0000 mg | ORAL_TABLET | ORAL | Status: DC

## 2017-01-10 MED ORDER — TAMSULOSIN HCL 0.4 MG PO CAPS
0.4000 mg | ORAL_CAPSULE | Freq: Every day | ORAL | Status: DC
  Administered 2017-01-11: 0.4 mg via ORAL
  Filled 2017-01-10: qty 1

## 2017-01-10 MED ORDER — ASPIRIN EC 81 MG PO TBEC
81.0000 mg | DELAYED_RELEASE_TABLET | Freq: Every morning | ORAL | Status: DC
  Administered 2017-01-11 – 2017-01-12 (×2): 81 mg via ORAL
  Filled 2017-01-10 (×2): qty 1

## 2017-01-10 MED ORDER — MIRTAZAPINE 30 MG PO TABS
30.0000 mg | ORAL_TABLET | Freq: Every day | ORAL | Status: DC
  Administered 2017-01-11 (×2): 30 mg via ORAL
  Filled 2017-01-10 (×3): qty 1

## 2017-01-10 MED ORDER — HYDROCODONE-ACETAMINOPHEN 5-325 MG PO TABS
1.0000 | ORAL_TABLET | ORAL | Status: DC | PRN
  Administered 2017-01-11 – 2017-01-12 (×2): 1 via ORAL
  Filled 2017-01-10 (×2): qty 1

## 2017-01-10 MED ORDER — ACETAMINOPHEN 650 MG RE SUPP: 650.0000 mg | Freq: Four times a day (QID) | RECTAL | Status: DC | PRN

## 2017-01-10 MED ORDER — HYDROCODONE-ACETAMINOPHEN 5-325 MG PO TABS
1.0000 | ORAL_TABLET | Freq: Once | ORAL | Status: AC
  Administered 2017-01-10: 1 via ORAL
  Filled 2017-01-10: qty 1

## 2017-01-10 MED ORDER — POLYETHYLENE GLYCOL 3350 17 G PO PACK: 17.0000 g | PACK | Freq: Every day | ORAL | Status: DC | PRN

## 2017-01-10 MED ORDER — SODIUM CHLORIDE 0.9 % IV SOLN
INTRAVENOUS | Status: AC
  Administered 2017-01-11 (×2): via INTRAVENOUS

## 2017-01-10 MED ORDER — ONDANSETRON HCL 4 MG/2ML IJ SOLN: 4.0000 mg | Freq: Four times a day (QID) | INTRAMUSCULAR | Status: DC | PRN

## 2017-01-10 MED ORDER — ACETAMINOPHEN 325 MG PO TABS
650.0000 mg | ORAL_TABLET | Freq: Four times a day (QID) | ORAL | Status: DC | PRN
  Administered 2017-01-11: 650 mg via ORAL
  Filled 2017-01-10: qty 2

## 2017-01-10 MED ORDER — LORAZEPAM 2 MG/ML IJ SOLN
1.0000 mg | INTRAMUSCULAR | Status: DC | PRN
  Administered 2017-01-10: 1 mg via INTRAVENOUS
  Filled 2017-01-10: qty 1

## 2017-01-10 MED ORDER — KETOCONAZOLE 2 % EX CREA
1.0000 "application " | TOPICAL_CREAM | Freq: Every day | CUTANEOUS | Status: DC | PRN
  Filled 2017-01-10: qty 15

## 2017-01-10 MED ORDER — LINACLOTIDE 145 MCG PO CAPS
145.0000 ug | ORAL_CAPSULE | Freq: Every day | ORAL | Status: DC
  Administered 2017-01-11 – 2017-01-12 (×2): 145 ug via ORAL
  Filled 2017-01-10 (×4): qty 1

## 2017-01-10 MED ORDER — ENOXAPARIN SODIUM 40 MG/0.4ML ~~LOC~~ SOLN
40.0000 mg | SUBCUTANEOUS | Status: DC
  Administered 2017-01-12: 40 mg via SUBCUTANEOUS
  Filled 2017-01-10 (×2): qty 0.4

## 2017-01-10 MED ORDER — HYDRALAZINE HCL 20 MG/ML IJ SOLN
5.0000 mg | Freq: Four times a day (QID) | INTRAMUSCULAR | Status: DC | PRN
  Administered 2017-01-11: 5 mg via INTRAVENOUS
  Filled 2017-01-10 (×2): qty 1

## 2017-01-10 MED ORDER — ATORVASTATIN CALCIUM 20 MG PO TABS
20.0000 mg | ORAL_TABLET | Freq: Every day | ORAL | Status: DC
  Administered 2017-01-11 – 2017-01-12 (×2): 20 mg via ORAL
  Filled 2017-01-10 (×2): qty 1

## 2017-01-10 MED ORDER — HYDROMORPHONE HCL 1 MG/ML IJ SOLN
0.5000 mg | INTRAMUSCULAR | Status: DC | PRN
  Administered 2017-01-11: 0.5 mg via INTRAVENOUS
  Filled 2017-01-10 (×3): qty 1

## 2017-01-10 MED ORDER — LEVOTHYROXINE SODIUM 25 MCG PO TABS: 25.0000 ug | ORAL_TABLET | ORAL | Status: DC

## 2017-01-10 MED ORDER — LATANOPROST 0.005 % OP SOLN
1.0000 [drp] | Freq: Every day | OPHTHALMIC | Status: DC
  Administered 2017-01-11 (×2): 1 [drp] via OPHTHALMIC
  Filled 2017-01-10: qty 2.5

## 2017-01-10 NOTE — ED Notes (Signed)
Patient transported to MRI 

## 2017-01-10 NOTE — ED Notes (Signed)
Patient transported to X-ray 

## 2017-01-10 NOTE — H&P (Signed)
TRH H&P   Patient Demographics:    Jose Leach, is a 83 y.o. male  MRN: [id number]   DOB - 1932-09-29  Admit Date - 01/10/2017  Outpatient Primary MD for the patient is [name], MD  Referring MD/NP/PA: Tanna Furry  Outpatient Specialists:  [name] (orthopedics) [name], MD as Consulting Physician (Urology) [name], MD as Consulting Physician (Neurology) [name], MD as Consulting Physician (Dermatology) [name], MD as Consulting Physician (Gastroenterology) [name], MD as Consulting Physician (Ophthalmology) [name], MD as Referring Physician (Psychiatry) [name], MD as Consulting Physician (General Surgery)    Patient coming from: [facility]  Chief Complaint  Patient presents with  . Fall      HPI:    Jose Leach  is a 83 y.o. male, w hypertension, hyperlipidemia, hypothyroidism, PAD,  cervical spinal stenosis, apparently c/o mechanical fall on Thursday and dveloped left hip pain.  He was previously ambulatory with walker and was unable after fall to bear weight and therefore went to ED for evaluation on Saturday.  Pt had xray of the left hip negative for fracture.  Pt was sent back to independent living and apparently was now bedbound and requiring hoyer lift or 2 person max assist for transfer due to pain and weakness.  Pt per family had had diarrhea prior to Thursday which might have weakened him and led to fall.  Pt is not currently having diarrhea.  Pt was transferred to SNF at [facility] and evaluated by Dr. [name] and sent to ER for evaluation.    In ED,   Wbc 6.9, Hgb 12.4, Plt 152 Na 140, K 4.8, Bun 30, creatinine 1.13 Alb 3.2  Ast 18, Alt 17 ua negative  CT pelvis IMPRESSION: Post left hip replacement. Nondisplaced fracture left acetabulum/ilium medial to the left  acetabular screws (series 4, images 29 through 33).   Marked right hip joint degenerative changes.  Prominent degenerative changes lower lumbar spine.  CT brain  IMPRESSION: No acute intracranial abnormality. Atrophy, chronic microvascular disease.  MRI C spine IMPRESSION: 1. No acute osseous abnormality or findings of ligamentous injury. 2. Stable right lateral C4-5 cord signal likely representing chronic compressive myelopathy. 3. 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 4. Stable severe C4-5 and C5-6 canal stenosis. Moderate C2-3, C3-4, C6-7 canal stenosis. 5. Stable multiple levels of high-grade foraminal stenosis as above.  Pt will be admitted for pain control for left hip nondisplaced fracture left acetablulum/ilium, medial to the left acetabular screws.     Review of systems:    In addition to the HPI above,  Pt denies arm weakness.   No Fever-chills, No Headache, No changes with Vision or hearing, No problems swallowing food or Liquids, No Chest pain, Cough or Shortness of Breath, No Abdominal pain, No Nausea or Vommitting, Bowel movements  are regular, No Blood in stool or Urine, No dysuria, No new skin rashes or bruises,   No recent weight gain or loss, No polyuria, polydypsia or polyphagia, No significant Mental Stressors.  A full 10 point Review of Systems was done, except as stated above, all other Review of Systems were negative.   With Past History of the following :    Past Medical History:  Diagnosis Date  . Acute urinary retention 01/28/2013  . AKI (acute kidney injury) ([place]) 01/26/2013  . Altered mental status 09/09/2013  . Anemia    B12 deficiency  . ANXIETY 06/07/2007   Qualifier: Diagnosis of  By: Talbert Cage CMA ([place]), June    . ARTHRITIS 06/07/2007   Qualifier: Diagnosis of  By: Talbert Cage CMA ([place]), June    . Balance problems 04/05/2011  . Benign essential tremor   . Benign fibroma of  prostate 10/11/2011  . Bilateral inguinal hernia 10/09/2012  . Bilateral leg edema 09/13/2012  . Bladder retention 02/05/2013  . Bradycardia, sinus 08/02/2012  . CAROTID ARTERY DISEASE 03/31/2007   Qualifier: Diagnosis of  By: Linna Darner MD, Gwyndolyn Saxon    . Carotid stenosis    s/p L CEA  . Cataract, nuclear 03/21/2014  . 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 Cervical spinal stenosis   . Cervical stenosis of spine   . Critical lower limb ischemia   . Degeneration macular 11/02/2011  . Depression    Dr Albertine Patricia  . Difficulty in walking(719.7) 04/05/2011  . ESOPHAGITIS 08/06/2002   Qualifier: Diagnosis of  By: Talbert Cage CMA ([place]), June    . Essential and other specified forms of tremor 04/18/2013  . Falls   . Family history of colon cancer 07/24/2012  . Fecal impaction ([place]) 10/11/2012  . Femoral hernia, bilateral s/p lap repair 01/16/2013 01/16/2013  . Glaucoma, compensated 03/21/2014  . H/O cardiovascular stress test    a. 02/2003 -> no ischemia/infarct.  Marland Kitchen HAMMER TOE 04/23/2010   Qualifier: Diagnosis of  By: Linna Darner MD, Gwyndolyn Saxon    . Hematoma of leg   . Hip hematoma, right 09/09/2013  . History of shingles 2009  . HTN (hypertension) 08/22/2012  . Hx of echocardiogram    Echocardiogram 08/01/12: Mild LVH, EF 55-60%, normal wall motion.  . Hydrocele 10/11/2011  . Hyperlipidemia   . 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 Hypothyroidism   . Macular degeneration disease   . Macular edema   . Migraines    Dr Jannifer Franklin  . MORTON'S NEUROMA, LEFT 08/22/2007   Qualifier: Diagnosis of  By: Linna Darner MD, Gwyndolyn Saxon    . PAD (peripheral artery disease) ([place])   . Peripheral neuropathy   . Personal history of colonic polyps 01/05/2013   2014 2 mm adenomatous polyp   . PREMATURE ATRIAL CONTRACTIONS 03/14/2006   Qualifier: Diagnosis of  By: Linna Darner MD, Gwendolyn Lima 12/16/2011  . SPINAL STENOSIS 03/14/2006   Qualifier: Diagnosis of  By: Linna Darner MD, William   Cervical spine    . Testosterone deficiency    Dr Lawerance Bach,  [facility]  . Unspecified vitamin D deficiency 08/06/2012  . Urge incontinence 10/09/2012  . Urinary tract infection, site not specified 02/13/2013  . Weakness 08/02/2012      Past Surgical History:  Procedure Laterality Date  . APPENDECTOMY  1941  . BREAST CYST EXCISION Left 01/16/2013   Procedure: MASS EXCISION AXILLA;  Surgeon: Adin Hector, MD;  Location: [place];  Service: General;  Laterality: Left;  . CAROTID ENDARTERECTOMY  2005  L  . CATARACT EXTRACTION Right   . COLONOSCOPY  2014   Dr. Deatra Ina  . EYE SURGERY Right 02/2012   retina  . INGUINAL HERNIA REPAIR Bilateral 01/16/2013   Procedure: LAPAROSCOPIC BILATERAL INGUINAL DIRECT AND INDIRECT, FEMORAL, AND OBTURATOR HERNIAS;  Surgeon: Adin Hector, MD;  Location: [place];  Service: General;  Laterality: Bilateral;  . INSERTION OF MESH N/A 01/16/2013   Procedure: INSERTION OF MESH;  Surgeon: Adin Hector, MD;  Location: [place];  Service: General;  Laterality: N/A;  . LUMBAR LAMINECTOMY    . TONSILLECTOMY AND ADENOIDECTOMY    . TOTAL HIP ARTHROPLASTY  2005   L      Social History:     Social History   Tobacco Use  . Smoking status: Former Smoker    Years: 10.00    Types: Pipe, Cigars    Last attempt to quit: 02/16/1979    Years since quitting: 37.9  . Smokeless tobacco: Never Used  . Tobacco comment: Quit about age 68   Substance Use Topics  . Alcohol use: No    Alcohol/week: 2.4 oz    Types: 4 Standard drinks or equivalent per week     Lives - at [facility] - previously walked with walker, now unable to walk   Family History :     Family History  Problem Relation Age of Onset  . Stroke Mother 60  . Hypertension Mother   . Diabetes Mother   . Heart disease Mother   . Rectal cancer Father 55  . Heart disease Father        in 36s  . Cancer Father        colorectal  . Nephritis Sister   . Seizures Brother        died as child     Home Medications:   Prior to Admission medications     Medication Sig Start Date End Date Taking? Authorizing Provider  aspirin 81 MG tablet Take 81 mg by mouth every morning.    Yes [provider]  atorvastatin (LIPITOR) 20 MG tablet Take one tablet by mouth once daily for cholesterol Patient taking differently: Take 20 mg by mouth daily.  03/02/16  Yes Estill Dooms, MD  B Complex-C (B-COMPLEX WITH VITAMIN C) tablet Take 1 tablet by mouth daily.   Yes [provider]  bimatoprost (LUMIGAN) 0.01 % SOLN Place 1 drop into both eyes at bedtime.   Yes [provider]  buPROPion (WELLBUTRIN XL) 150 MG 24 hr tablet Take 150 mg by mouth every morning. 05/22/16  Yes [provider]  Cholecalciferol (VITAMIN D3) 1000 units CAPS Take 1,000 Units by mouth daily.   Yes [provider]  clonazePAM (KLONOPIN) 0.5 MG tablet Take 0.25 mg by mouth at bedtime as needed for anxiety (sleep). Take one daily for anxiety  10/14/15  Yes [provider]  fluticasone (CUTIVATE) 0.05 % cream Apply 1 application topically 2 (two) times daily as needed (irritation).  05/15/16  Yes [provider]  ketoconazole (NIZORAL) 2 % cream Apply 1 application topically daily as needed for irritation.  05/15/16  Yes [provider]  Javier Docker Oil 500 MG CAPS Take 500 mg by mouth every morning.    Yes [provider]  levothyroxine (SYNTHROID) 50 MCG tablet Take 1/2 tablet by mouth on Sunday mornings and Take one tablet by mouth every other morning for thryoid. Patient taking differently: Take 25-50 mcg by mouth See admin  instructions. 83mcg on Sunday. Then 56mcg Mon/Wed/Fri 05/16/15  Yes Estill Dooms, MD  linaclotide [facility]) 145 MCG CAPS capsule Take 1 capsule (145 mcg total) by mouth daily before breakfast. 09/01/16  Yes [name], MD  mirtazapine (REMERON) 30 MG tablet Take 30 mg by mouth at bedtime.  02/16/16  Yes [provider]  Multiple Vitamins-Minerals (ICAPS PO) Take 1 tablet by mouth daily.    Yes [provider]  polyethylene glycol (MIRALAX / GLYCOLAX) packet Take 17 g by mouth daily as needed for mild constipation.   Yes [provider]  sertraline (ZOLOFT) 100 MG tablet Take 200 mg by mouth every morning.    Yes [provider]  tamsulosin (FLOMAX) 0.4 MG CAPS capsule TAKE 1 CAPSULE (0.4 MG TOTAL) BY MOUTH DAILY. 05/24/14  Yes [provider]  topiramate (TOPAMAX) 25 MG tablet TAKE ONE TABLET BY MOUTH EVERY MORNING AND 2 TABLETS IN THE EVENING Patient taking differently: Take 25-50 mg by mouth See admin instructions. 25mg  in the morning and 50mg  in the evening 01/01/16  Yes Dennie Bible, NP     Allergies:     Allergies  Allergen Reactions  . Latex Rash  . Tape Rash    Paper tape is ok to use      Physical Exam:   Vitals  Blood pressure (!) 166/70, pulse (!) 48, temperature 98.9 F (37.2 C), temperature source Oral, resp. rate 19, weight 65.8 kg (145 lb), SpO2 99 %.   1. General lying in bed in NAD,   2. Normal affect and insight, Not Suicidal or Homicidal, Awake Alert, Oriented X 3.  3. No F.N deficits, ALL C.Nerves Intact, Strength 5/5 all 4 extremities, Sensation intact all 4 extremities, Plantars down going.  4. Ears and Eyes appear Normal, Conjunctivae clear, PERRLA. Moist Oral Mucosa.  5. Supple Neck, No JVD, No cervical lymphadenopathy appriciated, No Carotid Bruits.  6. Symmetrical Chest wall movement, Good air movement bilaterally, CTAB.  7. RRR, No Gallops, Rubs or Murmurs, No Parasternal Heave.  8. Positive Bowel Sounds, Abdomen Soft, No tenderness, No organomegaly appriciated,No rebound -guarding or rigidity.  9.  No Cyanosis, Normal Skin Turgor, No Skin Rash or Bruise.  10. Good muscle tone,  joints appear normal , no effusions, Normal ROM.  11. No Palpable Lymph Nodes in Neck or Axillae     Data Review:    CBC Recent Labs  Lab 01/08/17 1356 01/10/17 1528  WBC 6.6 6.9  HGB 12.4* 12.4*    HCT 38.6* 39.3  PLT 141* 152  MCV 96.5 97.3  MCH 31.0 30.7  MCHC 32.1 31.6  RDW 14.1 14.4  LYMPHSABS 1.2 1.0  MONOABS 0.8 0.5  EOSABS 0.1 0.1  BASOSABS 0.0 0.0   ------------------------------------------------------------------------------------------------------------------  Chemistries  Recent Labs  Lab 01/08/17 1356 01/10/17 1528  NA 140 140  K 4.1 4.8  CL 111 112*  CO2 23 21*  GLUCOSE 95 87  BUN 35* 30*  CREATININE 1.26* 1.13  CALCIUM 8.7* 8.8*  AST  --  18  ALT  --  17  ALKPHOS  --  38  BILITOT  --  0.7   ------------------------------------------------------------------------------------------------------------------ estimated creatinine clearance is 46.9 mL/min (by C-G formula based on SCr of 1.13 mg/dL). ------------------------------------------------------------------------------------------------------------------ No results for input(s): TSH, T4TOTAL, T3FREE, THYROIDAB in the last 72 hours.  Invalid input(s): FREET3  Coagulation profile No results for input(s): INR, PROTIME in the last 168 hours. ------------------------------------------------------------------------------------------------------------------- No results for input(s): DDIMER in the last 72  hours. -------------------------------------------------------------------------------------------------------------------  Cardiac Enzymes No results for input(s): CKMB, TROPONINI, MYOGLOBIN in the last 168 hours.  Invalid input(s): CK ------------------------------------------------------------------------------------------------------------------ No results found for: BNP   ---------------------------------------------------------------------------------------------------------------  Urinalysis    Component Value Date/Time   COLORURINE YELLOW 01/10/2017 1600   APPEARANCEUR CLEAR 01/10/2017 1600   LABSPEC 1.026 01/10/2017 1600   PHURINE 6.0 01/10/2017 1600   GLUCOSEU NEGATIVE 01/10/2017  1600   GLUCOSEU NEGATIVE 07/29/2006 1302   HGBUR NEGATIVE 01/10/2017 1600   BILIRUBINUR NEGATIVE 01/10/2017 1600   KETONESUR NEGATIVE 01/10/2017 1600   PROTEINUR NEGATIVE 01/10/2017 1600   UROBILINOGEN 0.2 09/09/2013 1815   NITRITE NEGATIVE 01/10/2017 1600   LEUKOCYTESUR NEGATIVE 01/10/2017 1600    ----------------------------------------------------------------------------------------------------------------   Imaging Results:    Ct Head Wo Contrast  Result Date: 01/10/2017 CLINICAL DATA:  Fall on Thursday.  Weakness. EXAM: CT HEAD WITHOUT CONTRAST TECHNIQUE: Contiguous axial images were obtained from the base of the skull through the vertex without intravenous contrast. COMPARISON:  05/13/2016 FINDINGS: Brain: There is atrophy and chronic small vessel disease changes. No acute intracranial abnormality. Specifically, no hemorrhage, hydrocephalus, mass lesion, acute infarction, or significant intracranial injury. Vascular: No hyperdense vessel or unexpected calcification. Skull: No acute calvarial abnormality. Sinuses/Orbits: Visualized paranasal sinuses and mastoids clear. Orbital soft tissues unremarkable. Other: None IMPRESSION: No acute intracranial abnormality. Atrophy, chronic microvascular disease. Electronically Signed   By: Rolm Baptise M.D.   On: 01/10/2017 16:55   Ct Pelvis Wo Contrast  Result Date: 01/10/2017 CLINICAL DATA:  83 year old male fell yesterday onto left hip. Left hip pain. Surgery 15 years ago. Subsequent encounter. EXAM: CT PELVIS WITHOUT CONTRAST TECHNIQUE: Multidetector CT imaging of the pelvis was performed following the standard protocol without intravenous contrast. COMPARISON:  01/08/2017 plain film exam. FINDINGS: Urinary Tract: Evaluation of bladder limited by lack of contrast and streak artifact. Bowel: Evaluation of bowel limited by lack of contrast and streak artifact Vascular/Lymphatic: Calcified aorta and iliac arteries. No adenopathy. Reproductive:   Evaluation prostate gland limited by streak artifact. Other:  Hydroceles. Musculoskeletal: Post left hip replacement. Nondisplaced fracture left acetabulum/ilium medial to the left acetabular screws. Marked right hip joint degenerative changes. Prominent degenerative changes lower lumbar spine. IMPRESSION: Post left hip replacement. Nondisplaced fracture left acetabulum/ilium medial to the left acetabular screws (series 4, images 29 through 33). Marked right hip joint degenerative changes. Prominent degenerative changes lower lumbar spine. Electronically Signed   By: Genia Del M.D.   On: 01/10/2017 17:00   Mr Cervical Spine Wo Contrast  Result Date: 01/10/2017 CLINICAL DATA:  83 y/o  M; status post fall and incontinence. EXAM: MRI CERVICAL SPINE WITHOUT CONTRAST TECHNIQUE: Multiplanar, multisequence MR imaging of the cervical spine was performed. No intravenous contrast was administered. COMPARISON:  11/16/2016 MRI of the cervical spine. 11/10/2011 CT of the cervical spine. FINDINGS: Alignment: Stable straightening of cervical lordosis. Stable grade 1 C7-T1 anterolisthesis. Vertebrae: No fracture, evidence of discitis, or bone lesion. Vertebral body heights are preserved. No soft tissue edema or prevertebral fluid collection to suggest ligamentous injury. 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 No new abnormal cord signal. No evidence for cord hemorrhage. Posterior Fossa, vertebral arteries, paraspinal tissues: Negative. Disc levels:  Congenital cervical spinal canal stenosis. C2-3: Stable disc osteophyte complex eccentric to the left and right greater than left uncovertebral and facet hypertrophy. Severe right and mild left foraminal stenosis. Moderate canal stenosis with left anterior cord impingement and flattening. C3-4:  Stable disc osteophyte complex and extensive uncovertebral and facet hypertrophy. Severe  bilateral foraminal stenosis. Moderate canal stenosis with cord impingement and flattening. C4-5: Stable disc osteophyte complex, central protrusion, and left-greater-than-right uncovertebral/ facet hypertrophy. Moderate right and severe left foraminal stenosis. Severe canal stenosis with cord flattening. C5-6: Stable disc osteophyte complex with bilateral uncovertebral and facet hypertrophy. Moderate to severe bilateral foraminal stenosis. Severe canal stenosis with cord flattening. C6-7: Stable disc osteophyte complex with bilateral uncovertebral and facet hypertrophy greater on the left. Moderate right and severe left foraminal stenosis. Moderate canal stenosis with anterior cord impingement and flattening. C7-T1: Stable grade 1 anterolisthesis with uncovered disc bulge and mild bilateral uncovertebral and facet hypertrophy. Mild bilateral foraminal and canal stenosis. IMPRESSION: 1. No acute osseous abnormality or findings of ligamentous injury. 2. Stable right lateral C4-5 cord signal likely representing chronic compressive myelopathy. 3. 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 4. Stable severe C4-5 and C5-6 canal stenosis. Moderate C2-3, C3-4, C6-7 canal stenosis. 5. Stable multiple levels of high-grade foraminal stenosis as above. Electronically Signed   By: Kristine Garbe M.D.   On: 01/10/2017 21:47      Assessment & Plan:    Principal Problem:   Hip pain Active Problems:   Hypothyroidism   Bradycardia, sinus   Anemia    Left hip pain Nondisplaced fracture left acetabulum/ilium medial to the left acetabular screws Pain control  Please consult orthopedics in am to see if has any suggestions.  PT to evaluate and tx Bone density test as outpatient please norco for pain control, if insufficient dilaudid 0.5mg  iv q4h prn   Bradycardia (chronic) per patient and family Tele  PAD Cont aspirin Cont Lipitor  Hypothyroidism Check  tsh Cont levothyroxine  Anemia Check cbc in am  Protein calorie malnutrition prostat   Tinea cruris Cont nizoral cream  H/o Bph Cont flomax  Anxiety/ depression Cont clonazepam Cont wellbutrin   DVT Prophylaxis SCDs  AM Labs Ordered, also please review Full Orders  Family Communication: Admission, patients condition and plan of care including tests being ordered have been discussed with the patient who indicate understanding and agree with the plan and Code Status.  Code Status FULL CODE  Likely DC to  home  Condition GUARDED    Consults called:  none  Admission status: inpatient  Time spent in minutes : 45     Jani Gravel M.D on 01/10/2017 at 10:28 PM  Between 7am to 7pm - Pager - [phone]   After 7pm go to [email or web address] - password [facility]  [facility] - Office  [phone]

## 2017-01-10 NOTE — ED Notes (Signed)
Patient transported to CT 

## 2017-01-10 NOTE — ED Triage Notes (Signed)
Pt brought in by [employer]. EMS; per EMS pt lives in [place] at [facility] and fell on L hip at home on [address]; pt went to [facility] on Sat and xrays completed with a compression fx noted; today pt was moved to SNF (healthcare center) but has been immobile since fall last Thurs and ALF staff sent patient to ER. Pt also has laceration to L shin area and was to receive Tdap today at the facility; however he was sent to ER. Pt stated that he was attempting to take his night medication during time of fall and fell on both hips and crawled to kitchen where his wife found him. Pt A/O x4.

## 2017-01-10 NOTE — ED Provider Notes (Signed)
[facility]  SURGICAL Provider Note   CSN: [id number] Arrival date & time: 01/10/17  1432     History   Chief Complaint Chief Complaint  Patient presents with  . Fall    HPI Jose Leach is a 83 y.o. male. Chief complaint is "can't walk".  HPI:  This is an 83 year old male. He was previously residing in independent living at [facility] care/friend's [facility].  On Thursday or Friday he fell in his kitchen and had left hip pain. He was seen at [facility] had negative x-rays and was discharged back to that facility. Ranges from a friend to be moved from independent living, to SNF.  At intake today at his new skilled nursing area the provider expressed concern as the patient had been bedridden. He is unable to ambulate or transfer. He was requiring a hoyer lift, or max assist of 2 to even transfer. He s still having significant left hip pain.  hysician evaluation there expressed concern for possible stroke versus spinal cord compression as patient has history of spinal stenosis. She felt he was weak on his left side versus simply painful.  Patient also apparently had an episode of urinary incontinence today. He has had this in the past but haas  happened twice today.  Past Medical History:  Diagnosis Date  . Acute urinary retention 01/28/2013  . AKI (acute kidney injury) ([place]) 01/26/2013  . Altered mental status 09/09/2013  . Anemia    B12 deficiency  . ANXIETY 06/07/2007   Qualifier: Diagnosis of  By: Talbert Cage CMA ([place]), June    . ARTHRITIS 06/07/2007   Qualifier: Diagnosis of  By: Talbert Cage CMA ([place]), June    . Balance problems 04/05/2011  . Benign essential tremor   . Benign fibroma of prostate 10/11/2011  . Bilateral inguinal hernia 10/09/2012  . Bilateral leg edema 09/13/2012  . Bladder retention 02/05/2013  . Bradycardia, sinus 08/02/2012  . CAROTID ARTERY DISEASE 03/31/2007   Qualifier: Diagnosis of  By: Linna Darner MD, Gwyndolyn Saxon    .  Carotid stenosis    s/p L CEA  . Cataract, nuclear 03/21/2014  . 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 Cervical spinal stenosis   . Cervical stenosis of spine   . Critical lower limb ischemia   . Degeneration macular 11/02/2011  . Depression    Dr Albertine Patricia  . Difficulty in walking(719.7) 04/05/2011  . ESOPHAGITIS 08/06/2002   Qualifier: Diagnosis of  By: Talbert Cage CMA ([place]), June    . Essential and other specified forms of tremor 04/18/2013  . Fall at home 01/06/2017   left hip pain  . Falls   . Family history of colon cancer 07/24/2012  . Fecal impaction ([place]) 10/11/2012  . Femoral hernia, bilateral s/p lap repair 01/16/2013 01/16/2013  . Glaucoma, compensated 03/21/2014  . H/O cardiovascular stress test    a. 02/2003 -> no ischemia/infarct.  Marland Kitchen HAMMER TOE 04/23/2010   Qualifier: Diagnosis of  By: Linna Darner MD, Gwyndolyn Saxon    . Hematoma of leg   . Hip hematoma, right 09/09/2013  . History of shingles 2009  . HTN (hypertension) 08/22/2012  . Hx of echocardiogram    Echocardiogram 08/01/12: Mild LVH, EF 55-60%, normal wall motion.  . Hydrocele 10/11/2011  . Hyperlipidemia   . 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 Hypothyroidism   . Macular degeneration disease   . Macular edema   . Migraines    Dr Jannifer Franklin  . MORTON'S NEUROMA, LEFT 08/22/2007   Qualifier: Diagnosis  of  By: Linna Darner MD, Gwyndolyn Saxon    . PAD (peripheral artery disease) ([place])   . Peripheral neuropathy   . Personal history of colonic polyps 01/05/2013   2014 2 mm adenomatous polyp   . PREMATURE ATRIAL CONTRACTIONS 03/14/2006   Qualifier: Diagnosis of  By: Linna Darner MD, Gwendolyn Lima 12/16/2011  . SPINAL STENOSIS 03/14/2006   Qualifier: Diagnosis of  By: Linna Darner MD, William   Cervical spine    . Testosterone deficiency    Dr Lawerance Bach, [facility]  . Unspecified vitamin D deficiency 08/06/2012  . Urge incontinence 10/09/2012  . Urinary tract infection, site not specified 02/13/2013  . Weakness 08/02/2012    Patient Active Problem List    Diagnosis Date Noted  . Closed nondisplaced fracture of left acetabulum ([place]) 01/17/2017  . Severe protein-calorie malnutrition ([place]) 01/17/2017  . Hip pain 01/10/2017  . Normocytic anemia 01/10/2017  . Major depression, recurrent, chronic ([place]) 11/17/2016  . CKD (chronic kidney disease) stage 3, GFR 30-59 ml/min (HCC) 11/17/2016  . History of fall 06/22/2016  . Hyperglycemia 06/22/2016  . Elevated TSH 06/22/2016  . CKD (chronic kidney disease) stage 2, GFR 60-89 ml/min 06/22/2016  . Edema 12/23/2015  . Callus 07/23/2014  . Fall 07/09/2014  . Depression 07/09/2014  . Pain in left hip 05/22/2014  . AMD (age-related macular degeneration), wet ([place]) 03/21/2014  . Cataract, nuclear 03/21/2014  . Glaucoma, compensated 03/21/2014  . Status post intraocular lens implant 03/21/2014  . Benign prostatic hyperplasia with urinary obstruction 12/27/2013  . Knee pain, right 09/25/2013  . Bilateral buttock pain 09/18/2013  . Other testicular hypofunction 09/14/2013  . Hip hematoma, right 09/09/2013  . Altered mental status 09/09/2013  . Benign essential tremor 04/18/2013  . 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 Bladder retention 02/05/2013  . Renal cyst, left 01/30/2013  . Femoral hernia, bilateral s/p lap repair 01/16/2013 01/16/2013  . Personal history of colonic polyps 01/05/2013  . Fecal impaction ([place]) 10/11/2012  . Bilateral inguinal hernia 10/09/2012  . Urge incontinence 10/09/2012  . Bilateral leg edema 09/13/2012  . HTN (hypertension) 08/22/2012  . Unspecified vitamin D deficiency 08/06/2012  . Bradycardia, sinus 08/02/2012  . Constipation 07/24/2012  . Family history of colon cancer 07/24/2012  . Pseudoaphakia 12/16/2011  . Degeneration macular 11/02/2011  . Benign prostatic hyperplasia without lower urinary tract symptoms 10/11/2011  . Bladder neck obstruction 10/11/2011  . Hydrocele in adult 10/11/2011  . 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 Difficulty walking 04/05/2011    . Balance problems 04/05/2011  . Hammer toe 04/23/2010  . Unsteady gait 11/12/2009  . MORTON'S NEUROMA, LEFT 08/22/2007  . Anxiety state 06/07/2007  . ARTHRITIS 06/07/2007  . CAROTID ARTERY DISEASE 03/31/2007  . PAD (peripheral artery disease) ([place]) 03/16/2007  . Hypothyroidism 11/10/2006  . HYPERLIPIDEMIA 11/10/2006  . MIGRAINE HEADACHE 03/14/2006  . PREMATURE ATRIAL CONTRACTIONS 03/14/2006  . Myelopathy concurrent with and due to spinal stenosis of cervical region ([place]) 03/14/2006  . ESOPHAGITIS 08/06/2002    Past Surgical History:  Procedure Laterality Date  . APPENDECTOMY  1941  . BREAST CYST EXCISION Left 01/16/2013   Procedure: MASS EXCISION AXILLA;  Surgeon: Adin Hector, MD;  Location: [facility];  Service: General;  Laterality: Left;  . CAROTID ENDARTERECTOMY  2005    L  . CATARACT EXTRACTION Right   . COLONOSCOPY  2014   Dr. Deatra Ina  . EYE SURGERY Right 02/2012   retina  . INGUINAL HERNIA REPAIR Bilateral 01/16/2013   Procedure: LAPAROSCOPIC  BILATERAL INGUINAL DIRECT AND INDIRECT, FEMORAL, AND OBTURATOR HERNIAS;  Surgeon: Adin Hector, MD;  Location: [facility];  Service: General;  Laterality: Bilateral;  . INSERTION OF MESH N/A 01/16/2013   Procedure: INSERTION OF MESH;  Surgeon: Adin Hector, MD;  Location: [facility];  Service: General;  Laterality: N/A;  . LUMBAR LAMINECTOMY    . TONSILLECTOMY AND ADENOIDECTOMY    . TOTAL HIP ARTHROPLASTY  2005   L       Home Medications    Prior to Admission medications   Medication Sig Start Date End Date Taking? Authorizing Provider  aspirin 81 MG tablet Take 81 mg by mouth every morning.    Yes [provider]  atorvastatin (LIPITOR) 20 MG tablet Take one tablet by mouth once daily for cholesterol 03/02/16  Yes Estill Dooms, MD  B Complex-C (B-COMPLEX WITH VITAMIN C) tablet Take 1 tablet by mouth daily.   Yes [provider]  bimatoprost (LUMIGAN) 0.01 % SOLN Place 1 drop into both eyes at bedtime.   Yes  [provider]  buPROPion (WELLBUTRIN XL) 150 MG 24 hr tablet Take 150 mg by mouth daily.  05/22/16  Yes [provider]  Cholecalciferol (VITAMIN D3) 1000 units CAPS Take 1,000 Units by mouth daily.   Yes [provider]  fluticasone (CUTIVATE) 0.05 % cream Apply 1 application topically 2 (two) times daily as needed (irritation).  05/15/16  Yes [provider]  ketoconazole (NIZORAL) 2 % cream Apply 1 application topically daily as needed for irritation.  05/15/16  Yes [provider]  Javier Docker Oil 500 MG CAPS Take 500 mg by mouth every morning.    Yes [provider]  levothyroxine (SYNTHROID) 50 MCG tablet Take 1/2 tablet by mouth on Sunday mornings and Take one tablet by mouth every other morning for thryoid. 05/16/15  Yes Estill Dooms, MD  linaclotide [facility]) 145 MCG CAPS capsule Take 1 capsule (145 mcg total) by mouth daily before breakfast. 09/01/16  Yes [name], MD  Multiple Vitamins-Minerals (ICAPS PO) Take 1 tablet by mouth daily.   Yes [provider]  polyethylene glycol (MIRALAX / GLYCOLAX) packet Take 17 g by mouth daily as needed for mild constipation.   Yes [provider]  sertraline (ZOLOFT) 100 MG tablet Take 200 mg by mouth every morning.    Yes [provider]  tamsulosin (FLOMAX) 0.4 MG CAPS capsule TAKE 1 CAPSULE (0.4 MG TOTAL) BY MOUTH DAILY. 05/24/14  Yes [provider]  topiramate (TOPAMAX) 25 MG tablet TAKE ONE TABLET BY MOUTH EVERY MORNING AND 2 TABLETS IN THE EVENING 01/01/16  Yes Dennie Bible, NP  acetaminophen (TYLENOL) 325 MG tablet Take 2 tablets (650 mg total) by mouth every 6 (six) hours as needed for mild pain (or Fever >/= 101). 01/12/17   Bonnielee Haff, MD  clonazePAM (KLONOPIN) 0.5 MG tablet Take 0.5 tablets (0.25 mg total) by mouth at bedtime as needed for anxiety (sleep). Take one daily for anxiety 01/12/17   Bonnielee Haff, MD  HYDROcodone-acetaminophen  (NORCO/VICODIN) 5-325 MG tablet Take 1 tablet by mouth every 4 (four) hours as needed for moderate pain. 01/12/17   Bonnielee Haff, MD  mirtazapine (REMERON) 30 MG tablet Take 1 tablet (30 mg total) by mouth at bedtime. 01/12/17   Bonnielee Haff, MD    Family History Family History  Problem Relation Age of Onset  . Stroke Mother 2  . Hypertension Mother   . Diabetes Mother   .  Heart disease Mother   . Rectal cancer Father 39  . Heart disease Father        in 70s  . Cancer Father        colorectal  . Nephritis Sister   . Seizures Brother        died as child    Social History Social History   Tobacco Use  . Smoking status: Former Smoker    Years: 10.00    Types: Pipe, Cigars    Last attempt to quit: 02/16/1979    Years since quitting: 37.9  . Smokeless tobacco: Never Used  . Tobacco comment: Quit about age 43   Substance Use Topics  . Alcohol use: No    Alcohol/week: 2.4 oz    Types: 4 Standard drinks or equivalent per week  . Drug use: No     Allergies   Latex and Tape   Review of Systems Review of Systems  Constitutional: Positive for activity change, appetite change and fatigue. Negative for chills, diaphoresis and fever.  HENT: Negative for mouth sores, sore throat and trouble swallowing.   Eyes: Negative for visual disturbance.  Respiratory: Negative for cough, chest tightness, shortness of breath and wheezing.   Cardiovascular: Negative for chest pain.  Gastrointestinal: Negative for abdominal distention, abdominal pain, diarrhea, nausea and vomiting.  Endocrine: Negative for polydipsia, polyphagia and polyuria.  Genitourinary: Negative for dysuria, frequency and hematuria.  Musculoskeletal: Positive for arthralgias and gait problem.  Skin: Negative for color change, pallor and rash.  Neurological: Positive for weakness. Negative for dizziness, syncope, light-headedness and headaches.  Hematological: Does not bruise/bleed easily.    Psychiatric/Behavioral: Negative for behavioral problems and confusion.     Physical Exam Updated Vital Signs BP (!) 109/53 (BP Location: Left Arm)   Pulse (!) 55   Temp 98 F (36.7 C) (Oral)   Resp 18   Ht 6' (1.829 m)   Wt 65.9 kg (145 lb 4.5 oz)   SpO2 98%   BMI 19.70 kg/m   Physical Exam  Constitutional: He appears well-developed and well-nourished. No distress.  Awake. Conversant. Confused with complex conversation.  HENT:  Head: Normocephalic.  Eyes: Conjunctivae are normal. Pupils are equal, round, and reactive to light. No scleral icterus.  Neck: Normal range of motion. Neck supple. No thyromegaly present.  Cardiovascular: Normal rate and regular rhythm. Exam reveals no gallop and no friction rub.  No murmur heard. Pulmonary/Chest: Effort normal and breath sounds normal. No respiratory distress. He has no wheezes. He has no rales.  Abdominal: Soft. Bowel sounds are normal. He exhibits no distension. There is no tenderness. There is no rebound.  Musculoskeletal: Normal range of motion.  Tenderness with movement of left hip more so than with palpation. No lelength discrepancy. No pronator drift. Cannot evaluate left lower extremity for weakness since is painful and cannot lift it.  Neurological: He is alert.  Skin: Skin is warm and dry. No rash noted.  Psychiatric: He has a normal mood and affect. His behavior is normal.     ED Treatments / Results  Labs (all labs ordered are listed, but only abnormal results are displayed) Labs Reviewed  MRSA PCR SCREENING - Abnormal; Notable for the following components:      Result Value   MRSA by PCR POSITIVE (*)    All other components within normal limits  CBC WITH DIFFERENTIAL/PLATELET - Abnormal; Notable for the following components:   RBC 4.04 (*)    Hemoglobin 12.4 (*)  All other components within normal limits  COMPREHENSIVE METABOLIC PANEL - Abnormal; Notable for the following components:   Chloride 112 (*)     CO2 21 (*)    BUN 30 (*)    Calcium 8.8 (*)    Total Protein 6.2 (*)    Albumin 3.2 (*)    GFR calc non Af Amer 59 (*)    All other components within normal limits  COMPREHENSIVE METABOLIC PANEL - Abnormal; Notable for the following components:   BUN 28 (*)    Calcium 8.3 (*)    Total Protein 5.3 (*)    Albumin 2.8 (*)    ALT 15 (*)    All other components within normal limits  CBC - Abnormal; Notable for the following components:   RBC 3.96 (*)    Hemoglobin 11.9 (*)    HCT 38.1 (*)    Platelets 143 (*)    All other components within normal limits  TSH - Abnormal; Notable for the following components:   TSH 4.866 (*)    All other components within normal limits  URINALYSIS, ROUTINE W REFLEX MICROSCOPIC    EKG  EKG Interpretation  Date/Time:  Monday January 10 2017 14:33:14 EST Ventricular Rate:  52 PR Interval:    QRS Duration: 96 QT Interval:  430 QTC Calculation: 400 R Axis:   22 Text Interpretation:  Sinus bradycardia Short PR interval RSR' in V1 or V2, probably normal variant When compared with ECG of 05/13/2016, No significant change was found Confirmed by Delora Fuel ([id number]) on 01/10/2017 11:57:44 PM       Radiology No results found.  Procedures Procedures (including critical care time)  Medications Ordered in ED Medications  0.9 %  sodium chloride infusion ( Intravenous New Bag/Given 01/11/17 1619)  HYDROcodone-acetaminophen (NORCO/VICODIN) 5-325 MG per tablet 1 tablet (1 tablet Oral Given 01/10/17 1533)  haloperidol lactate (HALDOL) injection 2 mg (2 mg Intravenous Given 01/11/17 0519)     Initial Impression / Assessment and Plan / ED Course  I have reviewed the triage vital signs and the nursing notes.  Pertinent labs & imaging results that were available during my care of the patient were reviewed by me and considered in my medical decision making (see chart for details).  Clinical Course as of Jan 18 722  Mon Jan 10, 2017  2012 CT Pelvis Wo  Contrast [MJ]  2012 CT Pelvis Wo Contrast [MJ]    Clinical Course User Index [MJ] Tanna Furry, MD  CT scan of head does not show acute stroke. CT of hip shows pelvic fracture adjacent the screws from his acetabular component of his total hip arthroplasty. MR neck is pending.  Final Clinical Impressions(s) / ED Diagnoses   Final diagnoses:  Weakness    ED Discharge Orders        Ordered    acetaminophen (TYLENOL) 325 MG tablet  Every 6 hours PRN     01/12/17 0910    clonazePAM (KLONOPIN) 0.5 MG tablet  At bedtime PRN     01/12/17 0910    mirtazapine (REMERON) 30 MG tablet  Daily at bedtime     01/12/17 0910    HYDROcodone-acetaminophen (NORCO/VICODIN) 5-325 MG tablet  Every 4 hours PRN     01/12/17 0910    Discharge instructions    Comments:  See discharge summary for instructions.  You were cared for by a hospitalist during your hospital stay. If you have any questions about your discharge medications or the care you  received while you were in the hospital after you are discharged, you can call the unit and asked to speak with the hospitalist on call if the hospitalist that took care of you is not available. Once you are discharged, your primary care physician will handle any further medical issues. Please note that NO REFILLS for any discharge medications will be authorized once you are discharged, as it is imperative that you return to your primary care physician (or establish a relationship with a primary care physician if you do not have one) for your aftercare needs so that they can reassess your need for medications and monitor your lab values. If you do not have a primary care physician, you can call [phone] for a physician referral.   01/12/17 1240    Increase activity slowly     01/12/17 1240    Call MD for:  temperature >100.4     01/12/17 1240    Call MD for:  persistant nausea and vomiting     01/12/17 1240    Call MD for:  severe uncontrolled pain     01/12/17 1240     Call MD for:  persistant dizziness or light-headedness     01/12/17 1240    Call MD for:  extreme fatigue     01/12/17 1240       Tanna Furry, MD 01/18/17 [phone]

## 2017-01-10 NOTE — ED Notes (Signed)
Lytle Michaels, RN monitoring pt in MRI after Versed given

## 2017-01-10 NOTE — Progress Notes (Signed)
Provider:  [name] MD  [facility] Room Number: 19 Place of Service:  SNF (31)  PCP: [name], MD Patient Care Team: [name], MD as PCP - General (Internal Medicine) [name], MD as Consulting Physician (Urology) [name], MD as Consulting Physician (Neurology) [name], MD as Consulting Physician (Dermatology) [name], MD as Consulting Physician (Gastroenterology) [name], MD as Consulting Physician (Orthopedic Surgery) [name], MD as Consulting Physician (Cardiology) [name], MD as Consulting Physician (Ophthalmology) [name], MD as Referring Physician (Psychiatry) [name], MD as Consulting Physician (General Surgery) [name], MD as Consulting Physician (Orthopedic Surgery)  Extended Emergency Contact Information Primary Emergency Contact: Holsworth,[name] Address: [address]          [place], [place] [address] [place] Phone: [phone] Relation: Spouse  Code Status: full code Goals of Care: Advanced Directive information Advanced Directives 01/10/2017  Does Patient Have a Medical Advance Directive? Yes  Type of Paramedic of [place];Living will  Does patient want to make changes to medical advance directive? No - Patient declined  Copy of [place] in Chart? Yes  Pre-existing out of facility DNR order (yellow form or pink MOST form) -      Chief Complaint  Patient presents with  . New Admit To SNF    New Admission Visit     HPI: Patient is a 83 y.o. male seen today for admission visit. He resides in independent living and has been admitted to SNF due to inability to walk and unable to take care of himself. He had a fall on Thursday and developed acute on chronic left hip pain. He was ambulating around with his walker and able to bear weight on left leg.  On Saturday, he had worsening of left hip pain and was unable to walk. He also had worsening of his incontinence with his urine. He was sent to ED where he had xray of left hip that was negative for fracture. He was sent back to independent living. He is now bed bound and is requiring hoyer lift or 2 person maximum assist for transfer due to weakness. Per nursing, he has developed bowel and bladder incontinence. He is seen in his room today.   Past Medical History:  Diagnosis Date  . Acute urinary retention 01/28/2013  . AKI (acute kidney injury) ([place]) 01/26/2013  . Altered mental status 09/09/2013  . Anemia    B12 deficiency  . ANXIETY 06/07/2007   Qualifier: Diagnosis of  By: Talbert Cage CMA ([place]), June    . ARTHRITIS 06/07/2007   Qualifier: Diagnosis of  By: Talbert Cage CMA ([place]), June    . Balance problems 04/05/2011  . Benign essential tremor   . Benign fibroma of prostate 10/11/2011  . Bilateral inguinal hernia 10/09/2012  . Bilateral leg edema 09/13/2012  . Bladder retention 02/05/2013  . Bradycardia, sinus 08/02/2012  . CAROTID ARTERY DISEASE 03/31/2007   Qualifier: Diagnosis of  By: Linna Darner MD, Gwyndolyn Saxon    . Carotid stenosis    s/p L CEA  . Cataract, nuclear 03/21/2014  . 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 Cervical spinal stenosis   . Cervical stenosis of spine   . Critical lower limb ischemia   . Degeneration macular 11/02/2011  . Depression    Dr Albertine Patricia  . Difficulty in walking(719.7) 04/05/2011  .  ESOPHAGITIS 08/06/2002   Qualifier: Diagnosis of  By: Talbert Cage CMA ([place]), June    . Essential and other specified forms of tremor 04/18/2013  . Falls   . Family history of colon cancer 07/24/2012  . Fecal impaction ([place]) 10/11/2012  . Femoral hernia, bilateral s/p lap repair 01/16/2013 01/16/2013  . Glaucoma, compensated 03/21/2014  . H/O cardiovascular stress test    a. 02/2003 -> no ischemia/infarct.  Marland Kitchen HAMMER TOE 04/23/2010   Qualifier: Diagnosis of  By: Linna Darner MD, Gwyndolyn Saxon    . Hematoma of  leg   . Hip hematoma, right 09/09/2013  . History of shingles 2009  . HTN (hypertension) 08/22/2012  . Hx of echocardiogram    Echocardiogram 08/01/12: Mild LVH, EF 55-60%, normal wall motion.  . Hydrocele 10/11/2011  . Hyperlipidemia   . 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 Hypothyroidism   . Macular degeneration disease   . Macular edema   . Migraines    Dr Jannifer Franklin  . MORTON'S NEUROMA, LEFT 08/22/2007   Qualifier: Diagnosis of  By: Linna Darner MD, Gwyndolyn Saxon    . PAD (peripheral artery disease) ([place])   . Peripheral neuropathy   . Personal history of colonic polyps 01/05/2013   2014 2 mm adenomatous polyp   . PREMATURE ATRIAL CONTRACTIONS 03/14/2006   Qualifier: Diagnosis of  By: Linna Darner MD, Gwendolyn Lima 12/16/2011  . SPINAL STENOSIS 03/14/2006   Qualifier: Diagnosis of  By: Linna Darner MD, William   Cervical spine    . Testosterone deficiency    Dr Lawerance Bach, [facility]  . Unspecified vitamin D deficiency 08/06/2012  . Urge incontinence 10/09/2012  . Urinary tract infection, site not specified 02/13/2013  . Weakness 08/02/2012   Past Surgical History:  Procedure Laterality Date  . APPENDECTOMY  1941  . BREAST CYST EXCISION Left 01/16/2013   Procedure: MASS EXCISION AXILLA;  Surgeon: Adin Hector, MD;  Location: [facility];  Service: General;  Laterality: Left;  . CAROTID ENDARTERECTOMY  2005    L  . CATARACT EXTRACTION Right   . COLONOSCOPY  2014   Dr. Deatra Ina  . EYE SURGERY Right 02/2012   retina  . INGUINAL HERNIA REPAIR Bilateral 01/16/2013   Procedure: LAPAROSCOPIC BILATERAL INGUINAL DIRECT AND INDIRECT, FEMORAL, AND OBTURATOR HERNIAS;  Surgeon: Adin Hector, MD;  Location: [facility];  Service: General;  Laterality: Bilateral;  . INSERTION OF MESH N/A 01/16/2013   Procedure: INSERTION OF MESH;  Surgeon: Adin Hector, MD;  Location: [facility];  Service: General;  Laterality: N/A;  . LUMBAR LAMINECTOMY    . TONSILLECTOMY AND ADENOIDECTOMY    . TOTAL HIP ARTHROPLASTY  2005   L    reports  that he quit smoking about 37 years ago. His smoking use included pipe and cigars. He quit after 10.00 years of use. he has never used smokeless tobacco. He reports that he does not drink alcohol or use drugs. Social History   Socioeconomic History  . Marital status: Married    Spouse name: [name]  . Number of children: 2  . Years of education: Not on file  . Highest education level: Not on file  Social Needs  . Financial resource strain: Not on file  . Food insecurity - worry: Not on file  . Food insecurity - inability: Not on file  . Transportation needs - medical: Not on file  . Transportation needs - non-medical: Not on file  Occupational History  . Occupation: retired     Fish farm manager: RETIRED  Tobacco Use  . Smoking status: Former Smoker    Years: 10.00    Types: Pipe, Cigars    Last attempt to quit: 02/16/1979    Years since quitting: 37.9  . Smokeless tobacco: Never Used  . Tobacco comment: Quit about age 70   Substance and Sexual Activity  . Alcohol use: No    Alcohol/week: 2.4 oz    Types: 4 Standard drinks or equivalent per week  . Drug use: No  . Sexual activity: Not on file  Other Topics Concern  . Not on file  Social History Narrative   [facility] 2013   Married - [name]   Former smoker -stopped 1981   Alcohol -wine occasionally    Exercise - walking, Ne-step    POA, Living Will   [employer] with walker    Functional Status Survey:    Family History  Problem Relation Age of Onset  . Stroke Mother 88  . Hypertension Mother   . Diabetes Mother   . Heart disease Mother   . Rectal cancer Father 13  . Heart disease Father        in 59s  . Cancer Father        colorectal  . Nephritis Sister   . Seizures Brother        died as child    Health Maintenance  Topic Date Due  . TETANUS/TDAP  02/15/2018 (Originally 02/15/1951)  . INFLUENZA VACCINE  Completed  . PNA vac Low Risk Adult  Completed    Allergies  Allergen Reactions  . Latex  Rash  . Tape Rash    Paper tape is ok to use     Outpatient Encounter Medications as of 01/10/2017  Medication Sig  . aspirin 81 MG tablet Take 81 mg by mouth every morning.   Marland Kitchen atorvastatin (LIPITOR) 20 MG tablet Take one tablet by mouth once daily for cholesterol  . B Complex-C (B-COMPLEX WITH VITAMIN C) tablet Take 1 tablet by mouth daily.  . bimatoprost (LUMIGAN) 0.01 % SOLN Place 1 drop into both eyes at bedtime.  Marland Kitchen buPROPion (WELLBUTRIN XL) 150 MG 24 hr tablet Take 150 mg by mouth every morning.  . Cholecalciferol (VITAMIN D3) 1000 units CAPS Take 1,000 Units by mouth daily.  . clonazePAM (KLONOPIN) 0.5 MG tablet Take 0.25 mg by mouth at bedtime as needed for anxiety (sleep). Take one daily for anxiety   . fluticasone (CUTIVATE) 0.05 % cream Apply 1 application topically 2 (two) times daily as needed (irritation).   Marland Kitchen ketoconazole (NIZORAL) 2 % cream Apply 1 application topically daily as needed for irritation.   Javier Docker Oil 500 MG CAPS Take 500 mg by mouth every morning.   Marland Kitchen levothyroxine (SYNTHROID) 50 MCG tablet Take 1/2 tablet by mouth on Sunday mornings and Take one tablet by mouth every other morning for thryoid.  Marland Kitchen linaclotide (LINZESS) 145 MCG CAPS capsule Take 1 capsule (145 mcg total) by mouth daily before breakfast.  . mirtazapine (REMERON) 30 MG tablet Take 30 mg by mouth at bedtime.   . Multiple Vitamins-Minerals (ICAPS PO) Take 1 tablet by mouth daily.  . polyethylene glycol (MIRALAX / GLYCOLAX) packet Take 17 g by mouth daily as needed for mild constipation.  . sertraline (ZOLOFT) 100 MG tablet Take 200 mg by mouth every morning.   . tamsulosin (FLOMAX) 0.4 MG CAPS capsule TAKE 1 CAPSULE (0.4 MG TOTAL) BY MOUTH DAILY.  Marland Kitchen topiramate (TOPAMAX) 25 MG tablet TAKE  ONE TABLET BY MOUTH EVERY MORNING AND 2 TABLETS IN THE EVENING   No facility-administered encounter medications on file as of 01/10/2017.     Review of Systems  Constitutional: Negative for appetite change,  chills and fever.  HENT: Positive for hearing loss.   Eyes: Positive for visual disturbance.  Respiratory: Negative for shortness of breath and wheezing.   Cardiovascular: Positive for leg swelling. Negative for chest pain and palpitations.  Gastrointestinal: Negative for abdominal pain, nausea and vomiting.       Has bowel and bladder incontinence  Genitourinary: Negative for dysuria and flank pain.  Musculoskeletal: Positive for arthralgias, back pain, gait problem and neck pain. Negative for neck stiffness.       Left hip pain worsens with movement. Left hip pain radiates to his buttock and leg. He is unable to sit up in a chair by himself. Feels weak in his left arm.   Skin: Negative for rash.  Neurological: Positive for tremors and weakness. Negative for dizziness, light-headedness, numbness and headaches.  Hematological: Bruises/bleeds easily.  Psychiatric/Behavioral: Positive for confusion. Negative for agitation and hallucinations.    Vitals:   01/10/17 1321  BP: 134/78  Pulse: (!) 56  Resp: 16  Temp: 98.1 F (36.7 C)  TempSrc: Oral  SpO2: 98%  Weight: 147 lb (66.7 kg)  Height: 6' (1.829 m)   Body mass index is 19.94 kg/m. Physical Exam  Constitutional: He is oriented to person, place, and time. No distress.  Thin built, frail elderly male in no acute distress  HENT:  Head: Normocephalic and atraumatic.  Mouth/Throat: Oropharynx is clear and moist. No oropharyngeal exudate.  Eyes: Conjunctivae and EOM are normal. Right eye exhibits no discharge. Left eye exhibits no discharge.  Neck: Normal range of motion. Neck supple.  Cardiovascular:  bradycardia  Pulmonary/Chest: Effort normal and breath sounds normal. No respiratory distress. He has no wheezes. He has no rales. He exhibits no tenderness.  Abdominal: Soft. Bowel sounds are normal. He exhibits no distension. There is no tenderness. There is no guarding.  Musculoskeletal: He exhibits edema and tenderness.  Edema  around ankle. Can move his upper extremities with left sided weakness. Normal grip strength. No spine tenderness. Kyphosis and scoliosis present. ROM at left hip ilicits pain.   Lymphadenopathy:    He has no cervical adenopathy.  Neurological: He is alert and oriented to person, place, and time. He displays tremor and abnormal reflex. No sensory deficit. He displays no Babinski's sign on the right side. He displays no Babinski's sign on the left side.  Reflex Scores:      Patellar reflexes are 2+ on the right side and 1+ on the left side. Slow with conversation, vocal tremor noted. Tremors present. Decreased left patellar reflex. Decreased strength to LUE and LLE with LLE > LUE. LLE strength 4/5. Does not recall having bowel incontinence.   Skin: Skin is warm and dry. No rash noted. He is not diaphoretic.  Skin tear to LLE with steri strips. Buttock area not visulaized  Psychiatric: He has a normal mood and affect.    Labs reviewed: Basic Metabolic Panel: Recent Labs    05/13/16 2021 11/15/16 0800 01/08/17 1356  NA 140 142 140  K 4.1 3.9 4.1  CL 109 107 111  CO2 24 28 23   GLUCOSE 119* 74 95  BUN 36* 33* 35*  CREATININE 1.20 1.33* 1.26*  CALCIUM 8.4* 9.5 8.7*   Liver Function Tests: Recent Labs    02/02/16 0830 05/13/16  2021 11/15/16 0800  AST 18 20 19   ALT 15 15* 21  ALKPHOS 42 38  --   BILITOT 0.5 0.4 0.6  PROT 7.1 5.8* 6.8  ALBUMIN 4.0 3.2*  --    No results for input(s): LIPASE, AMYLASE in the last 8760 hours. No results for input(s): AMMONIA in the last 8760 hours. CBC: Recent Labs    05/13/16 2021 11/15/16 0800 01/08/17 1356  WBC 4.7 5.2 6.6  NEUTROABS 3.3  --  4.6  HGB 13.1 15.2 12.4*  HCT 40.2 46.5 38.6*  MCV 92.6 91.5 96.5  PLT 128* 178 141*   Cardiac Enzymes: No results for input(s): CKTOTAL, CKMB, CKMBINDEX, TROPONINI in the last 8760 hours. BNP: Invalid input(s): POCBNP Lab Results  Component Value Date   HGBA1C 5.3 07/31/2012   Lab Results    Component Value Date   TSH 2.30 11/15/2016   Lab Results  Component Value Date   [id number] 448 08/02/2012   No results found for: FOLATE No results found for: IRON, TIBC, FERRITIN  Imaging and Procedures obtained prior to SNF admission: Dg Hip Unilat With Pelvis 2-3 Views Left  Result Date: 01/08/2017 CLINICAL DATA:  [place] yesterday onto left hip. Complains of pain since fall. Left hip arthroplasty. EXAM: DG HIP (WITH OR WITHOUT PELVIS) 2-3V LEFT COMPARISON:  08/09/2016 FINDINGS: Left hip arthroplasty is located. The entire left femoral stem is visualized without a periprosthetic fracture. Pelvic bony ring is intact. Again noted is severe joint space narrowing along the superior right hip joint. Degenerative disc and endplate changes in the lower lumbar spine. IMPRESSION: No acute abnormality to the pelvis or left hip. Severe osteoarthritic changes in the right hip. Electronically Signed   By: Markus Daft M.D.   On: 01/08/2017 13:57   11/16/16 MRI C- spine IMPRESSION:  Abnormal MRI cervical spine (without) demonstrating: 1. At C2-3: disc bulging, ligamentum flavum hypertrophy, facet hypertrophy, with moderate-severe spinal stenosis and severe right foraminal stenosis  2. At C3-4: disc bulging, ligamentum flavum hypertrophy, facet hypertrophy, with moderate spinal stenosis, severe right and moderate left foraminal stenosis  3. At C4-5: disc bulging and facet hypertrophy with moderate spinal stenosis, moderate right and severe left foraminal stenosis  4. At C5-6: disc bulging and facet hypertrophy with mild spinal stenosis, moderate right and mild left foraminal stenosis  5. At C6-7: disc bulging and facet hypertrophy with severe left foraminal stenosis  6. No cord signal abnormalities noted. There is spinal cord atrophy on axial views from C3-4 to C6-7.  7. Compared to MRI on 12/06/11, there has been progression of spinal stenosis at C2-3. The other visualized levels are not significantly  changed.       Assessment/Plan  Left hemiparesis New onset post fall on Thursday. With MRI c-spine showing severe stenosis, pt being bed bound from being able to walk with walker and his frequent falls and new incontinence with both bowel and bladder, concern for spinal cord compression. Will send him to ED for stat imaging of his spine to rule out cord compression and possible neurosurgery consult for evaluation for decompression. Acute CVA will also need to be ruled out with his new weakness and his risk factors. Have reviewed this with patient and his son Tiberius over the phone. They agree with current plan.   Bowel and bladder incontinence Has been having occasional incontinence with bladder for a month but now has complete incontinence with bowel and bladder. Concern for cord compression resulting in this. MRI spine and neurology  and neurosurgery for further evaluation.   Left hip pain Acute on chronic pain with worsening weakness of leg post fall. Left hip xray ruled out fracture. Given his worsening pain with movement and new weakness, will need further CT imaging study to rule out fracture.    Family/ staff Communication: reviewed care plan with patient, his son Burl and Camera operator.    Labs/tests ordered: sending patient to ED for further evaluation.   [name], MD Internal Medicine [facility] [address] [place], [place] [address] Cell Phone (Monday-Friday 8 am - 5 pm): [phone] On Call: [phone] and follow prompts after 5 pm and on weekends Office Phone: [phone] Office Fax: [phone]

## 2017-01-10 NOTE — ED Notes (Signed)
Pt called out and complaining of hip pain.

## 2017-01-11 ENCOUNTER — Encounter (HOSPITAL_COMMUNITY): Payer: Self-pay | Admitting: *Deleted

## 2017-01-11 ENCOUNTER — Other Ambulatory Visit: Payer: Self-pay

## 2017-01-11 DIAGNOSIS — M25552 Pain in left hip: Secondary | ICD-10-CM | POA: Diagnosis not present

## 2017-01-11 LAB — CBC
HEMATOCRIT: 38.1 % — AB (ref 39.0–52.0)
HEMOGLOBIN: 11.9 g/dL — AB (ref 13.0–17.0)
MCH: 30.1 pg (ref 26.0–34.0)
MCHC: 31.2 g/dL (ref 30.0–36.0)
MCV: 96.2 fL (ref 78.0–100.0)
Platelets: 143 10*3/uL — ABNORMAL LOW (ref 150–400)
RBC: 3.96 MIL/uL — ABNORMAL LOW (ref 4.22–5.81)
RDW: 14.2 % (ref 11.5–15.5)
WBC: 6.3 10*3/uL (ref 4.0–10.5)

## 2017-01-11 LAB — COMPREHENSIVE METABOLIC PANEL
ALT: 15 U/L — AB (ref 17–63)
AST: 16 U/L (ref 15–41)
Albumin: 2.8 g/dL — ABNORMAL LOW (ref 3.5–5.0)
Alkaline Phosphatase: 38 U/L (ref 38–126)
Anion gap: 5 (ref 5–15)
BUN: 28 mg/dL — AB (ref 6–20)
CHLORIDE: 111 mmol/L (ref 101–111)
CO2: 25 mmol/L (ref 22–32)
CREATININE: 1.07 mg/dL (ref 0.61–1.24)
Calcium: 8.3 mg/dL — ABNORMAL LOW (ref 8.9–10.3)
GFR calc Af Amer: >60 mL/min (ref >60)
Glucose, Bld: 92 mg/dL (ref 65–99)
Potassium: 4.1 mmol/L (ref 3.5–5.1)
Sodium: 141 mmol/L (ref 135–145)
Total Bilirubin: 0.7 mg/dL (ref 0.3–1.2)
Total Protein: 5.3 g/dL — ABNORMAL LOW (ref 6.5–8.1)

## 2017-01-11 LAB — MRSA PCR SCREENING: MRSA BY PCR: POSITIVE — AB

## 2017-01-11 LAB — TSH: TSH: 4.866 u[IU]/mL — ABNORMAL HIGH (ref 0.350–4.500)

## 2017-01-11 MED ORDER — MUPIROCIN 2 % EX OINT
1.0000 "application " | TOPICAL_OINTMENT | Freq: Two times a day (BID) | CUTANEOUS | Status: DC
  Administered 2017-01-11 – 2017-01-12 (×4): 1 via NASAL
  Filled 2017-01-11 (×2): qty 22

## 2017-01-11 MED ORDER — TOPIRAMATE 25 MG PO TABS
25.0000 mg | ORAL_TABLET | Freq: Every day | ORAL | Status: DC
  Administered 2017-01-11 – 2017-01-12 (×2): 25 mg via ORAL
  Filled 2017-01-11 (×2): qty 1

## 2017-01-11 MED ORDER — LEVOTHYROXINE SODIUM 25 MCG PO TABS: 25.0000 ug | ORAL_TABLET | ORAL | Status: DC

## 2017-01-11 MED ORDER — LEVOTHYROXINE SODIUM 50 MCG PO TABS
50.0000 ug | ORAL_TABLET | ORAL | Status: DC
  Administered 2017-01-12: 50 ug via ORAL
  Filled 2017-01-11: qty 1

## 2017-01-11 MED ORDER — HALOPERIDOL LACTATE 5 MG/ML IJ SOLN
2.0000 mg | Freq: Once | INTRAMUSCULAR | Status: AC
  Administered 2017-01-11: 2 mg via INTRAVENOUS
  Filled 2017-01-11: qty 1

## 2017-01-11 MED ORDER — TOPIRAMATE 100 MG PO TABS
50.0000 mg | ORAL_TABLET | Freq: Every day | ORAL | Status: DC
  Administered 2017-01-11 (×2): 50 mg via ORAL
  Filled 2017-01-11 (×2): qty 1

## 2017-01-11 MED ORDER — CHLORHEXIDINE GLUCONATE CLOTH 2 % EX PADS
6.0000 | MEDICATED_PAD | Freq: Every day | CUTANEOUS | Status: DC
  Administered 2017-01-11 – 2017-01-12 (×2): 6 via TOPICAL

## 2017-01-11 NOTE — ED Notes (Signed)
Attempted to provide Dilaudid prior to transfer to the floor. Pt refused dilaudid.

## 2017-01-11 NOTE — Progress Notes (Signed)
Pt was found disoriented, undressed and trying to get out of bed. Blood noted on hands and bedding coming from his IV site. IV side was assessed and determined to be removed.  IV inserted on anterior left arm and IV fluids were restarted. Pt was oriented to place and equipment.  Pt was left calm and resting with family in the room.  Will continue monitoring

## 2017-01-11 NOTE — Progress Notes (Signed)
Patient is hypertensive 204/67 when he arrived to unit. Patient stated also that he was in pain, but he refused blood pressure and pain medication. Stated that he had a reaction to blood pressure medication but could not tell me what the reaction was. He stated to let wait a couple of days before giving medications. Will continue to monitor. Arthor Captain LPN

## 2017-01-11 NOTE — Progress Notes (Signed)
Consult received. Imaging reviewed. Patient has periprosthetic incomplete nondisplaced L posterior column acetabulum fracture. Plan for nonoperative treatment. TDWB LLE with walker. PT/OT, SNF placement. Full consult to follow.

## 2017-01-11 NOTE — Evaluation (Signed)
Physical Therapy Evaluation Patient Details Name: Jose Leach MRN: [id number] DOB: 1932/02/18 Today's Date: 01/11/2017   History of Present Illness  83 y.o. male admitted with fall and L hip pain. PMH of L THA,  hypertension, hyperlipidemia, hypothyroidism, PAD,  cervical spinal stenosis. Dx of L acetabular fx, conservative treatment, TDWB. Pt admitted from [facility].   Clinical Impression  Pt admitted with above diagnosis. Pt currently with functional limitations due to the deficits listed below (see PT Problem List). +2 max assist for bed to recliner transfer. Pain and guarding of LLE limit activity tolerance. Pt will need ST-SNF upon acute DC.  Pt will benefit from skilled PT to increase their independence and safety with mobility to allow discharge to the venue listed below.       Follow Up Recommendations SNF    Equipment Recommendations  Wheelchair (measurements PT);Wheelchair cushion (measurements PT)    Recommendations for Other Services       Precautions / Restrictions Precautions Precautions: Fall Restrictions Weight Bearing Restrictions: Yes LLE Weight Bearing: Touchdown weight bearing      Mobility  Bed Mobility Overal bed mobility: Needs Assistance Bed Mobility: Supine to Sit     Supine to sit: Max assist;HOB elevated     General bed mobility comments: pt 40%; assist to raise trunk and advance BLEs, pad used to pivot hips to EOB  Transfers Overall transfer level: Needs assistance Equipment used: Rolling walker (2 wheeled) Transfers: Sit to/from [facility] Sit to Stand: Max assist;+2 physical assistance;From elevated surface Stand pivot transfers: Max assist;+2 physical assistance;From elevated surface       General transfer comment: attempted to stand with RW, pt unable to stand fully upright, he maintained trunk in flexed posture; +2 side by side transfer to pivot to recliner   Ambulation/Gait              General Gait Details: unable  Stairs            Wheelchair Mobility    Modified Rankin (Stroke Patients Only)       Balance Overall balance assessment: Needs assistance;History of Falls Sitting-balance support: Feet supported Sitting balance-Leahy Scale: Fair     Standing balance support: Bilateral upper extremity supported Standing balance-Leahy Scale: Zero                               Pertinent Vitals/Pain Pain Assessment: 0-10 Pain Score: 7  Pain Location: L hip with movement Pain Descriptors / Indicators: Sore;Guarding;Grimacing Pain Intervention(s): Limited activity within patient's tolerance;Monitored during session;Premedicated before session;Repositioned    Home Living Family/patient expects to be discharged to:: Private residence Living Arrangements: Spouse/significant other Available Help at Discharge: Family;Available 24 hours/day Type of Home: Apartment Home Access: Level entry     Home Layout: One level Home Equipment: Walker - 4 wheels;Grab bars - tub/shower;Grab bars - toilet;Hand held shower head      Prior Function Level of Independence: Independent with assistive device(s)         Comments: walks with rollator     Hand Dominance        Extremity/Trunk Assessment   Upper Extremity Assessment Upper Extremity Assessment: Overall WFL for tasks assessed    Lower Extremity Assessment Lower Extremity Assessment: LLE deficits/detail LLE Deficits / Details: knee ext AROM -30*, hip flexion AAROM 40* limited by pain; pt guards LLE with any movement LLE: Unable to fully assess due to pain LLE  Sensation: history of peripheral neuropathy    Cervical / Trunk Assessment Cervical / Trunk Assessment: Normal  Communication   Communication: No difficulties  Cognition Arousal/Alertness: Awake/alert Behavior During Therapy: WFL for tasks assessed/performed Overall Cognitive Status: No family/caregiver present to determine  baseline cognitive functioning(oriented to self, location, and situation but made some a few unusual comments like, "the staff here seem to have a cultish attitude". )                                        General Comments      Exercises     Assessment/Plan    PT Assessment Patient needs continued PT services  PT Problem List Decreased range of motion;Decreased activity tolerance;Decreased balance;Decreased mobility;Pain       PT Treatment Interventions DME instruction;Gait training;Therapeutic exercise;Therapeutic activities;Functional mobility training;Balance training;Patient/family education    PT Goals (Current goals can be found in the Care Plan section)  Acute Rehab PT Goals Patient Stated Goal: ST[facility]SNF [facility] PT Goal Formulation: With patient Time For Goal Achievement: 01/25/17 Potential to Achieve Goals: Fair    Frequency Min 3X/week   Barriers to discharge        Co-evaluation               AM-PAC PT "6 Clicks" Daily Activity  Outcome Measure Difficulty turning over in bed (including adjusting bedclothes, sheets and blankets)?: Unable Difficulty moving from lying on back to sitting on the side of the bed? : Unable Difficulty sitting down on and standing up from a chair with arms (e.g., wheelchair, bedside commode, etc,.)?: Unable Help needed moving to and from a bed to chair (including a wheelchair)?: Total Help needed walking in hospital room?: Total Help needed climbing 3-5 steps with a railing? : Total 6 Click Score: 6    End of Session Equipment Utilized During Treatment: Gait belt Activity Tolerance: Patient limited by pain Patient left: in chair;with chair alarm set;with call bell/phone within reach Nurse Communication: Mobility status(+2 for transfer) PT Visit Diagnosis: History of falling (Z91.81);Difficulty in walking, not elsewhere classified (R26.2);Pain Pain - Right/Left: Left Pain - part of body: Hip     Time: 4970-2637 PT Time Calculation (min) (ACUTE ONLY): 43 min   Charges:   PT Evaluation $PT Eval Moderate Complexity: 1 Mod PT Treatments $Therapeutic Activity: 23-37 mins   PT G Codes:          Philomena Doheny 01/11/2017, 1:08 PM [phone]

## 2017-01-11 NOTE — Consult Note (Signed)
ORTHOPAEDIC CONSULTATION  REQUESTING PHYSICIAN: Elmarie Shiley, MD  PCP:  [name], MD  Chief Complaint: Pelvic fracture  HPI: Jose Leach is a 83 y.o. male who complains of left hip pain.  Patient fell last Thursday.  He had left hip pain and inability to weight-bear.  He was taken to the ED for evaluation on Saturday, and x-rays were normal.  He was discharged back to his independent living facility.  He is now bedbound due to left hip pain.  He returned back to the emergency department yesterday.  CT scan was obtained showing a nondisplaced, incomplete posterior column acetabular fracture not involving his previous hip replacement.  He was admitted to the hospitalist service.  Orthopedic consultation was placed for management of the fracture.  He denies other injuries.  Past Medical History:  Diagnosis Date  . Acute urinary retention 01/28/2013  . AKI (acute kidney injury) ([place]) 01/26/2013  . Altered mental status 09/09/2013  . Anemia    B12 deficiency  . ANXIETY 06/07/2007   Qualifier: Diagnosis of  By: Talbert Cage CMA ([place]), June    . ARTHRITIS 06/07/2007   Qualifier: Diagnosis of  By: Talbert Cage CMA ([place]), June    . Balance problems 04/05/2011  . Benign essential tremor   . Benign fibroma of prostate 10/11/2011  . Bilateral inguinal hernia 10/09/2012  . Bilateral leg edema 09/13/2012  . Bladder retention 02/05/2013  . Bradycardia, sinus 08/02/2012  . CAROTID ARTERY DISEASE 03/31/2007   Qualifier: Diagnosis of  By: Linna Darner MD, Gwyndolyn Saxon    . Carotid stenosis    s/p L CEA  . Cataract, nuclear 03/21/2014  . 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 Cervical spinal stenosis   . Cervical stenosis of spine   . Critical lower limb ischemia   . Degeneration macular 11/02/2011  . Depression    Dr Albertine Patricia  . Difficulty in walking(719.7) 04/05/2011  . ESOPHAGITIS 08/06/2002   Qualifier: Diagnosis of  By: Talbert Cage CMA ([place]), June    . Essential and other specified forms of tremor  04/18/2013  . Falls   . Family history of colon cancer 07/24/2012  . Fecal impaction ([place]) 10/11/2012  . Femoral hernia, bilateral s/p lap repair 01/16/2013 01/16/2013  . Glaucoma, compensated 03/21/2014  . H/O cardiovascular stress test    a. 02/2003 -> no ischemia/infarct.  Marland Kitchen HAMMER TOE 04/23/2010   Qualifier: Diagnosis of  By: Linna Darner MD, Gwyndolyn Saxon    . Hematoma of leg   . Hip hematoma, right 09/09/2013  . History of shingles 2009  . HTN (hypertension) 08/22/2012  . Hx of echocardiogram    Echocardiogram 08/01/12: Mild LVH, EF 55-60%, normal wall motion.  . Hydrocele 10/11/2011  . Hyperlipidemia   . 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 Hypothyroidism   . Macular degeneration disease   . Macular edema   . Migraines    Dr Jannifer Franklin  . MORTON'S NEUROMA, LEFT 08/22/2007   Qualifier: Diagnosis of  By: Linna Darner MD, Gwyndolyn Saxon    . PAD (peripheral artery disease) ([place])   . Peripheral neuropathy   . Personal history of colonic polyps 01/05/2013   2014 2 mm adenomatous polyp   . PREMATURE ATRIAL CONTRACTIONS 03/14/2006   Qualifier: Diagnosis of  By: Linna Darner MD, Gwendolyn Lima 12/16/2011  . SPINAL STENOSIS 03/14/2006   Qualifier: Diagnosis of  By: Linna Darner MD, William   Cervical spine    . Testosterone deficiency    Dr Lawerance Bach, [facility]  . Unspecified vitamin D deficiency 08/06/2012  .  Urge incontinence 10/09/2012  . Urinary tract infection, site not specified 02/13/2013  . Weakness 08/02/2012   Past Surgical History:  Procedure Laterality Date  . APPENDECTOMY  1941  . BREAST CYST EXCISION Left 01/16/2013   Procedure: MASS EXCISION AXILLA;  Surgeon: Adin Hector, MD;  Location: [facility];  Service: General;  Laterality: Left;  . CAROTID ENDARTERECTOMY  2005    L  . CATARACT EXTRACTION Right   . COLONOSCOPY  2014   Dr. Deatra Ina  . EYE SURGERY Right 02/2012   retina  . INGUINAL HERNIA REPAIR Bilateral 01/16/2013   Procedure: LAPAROSCOPIC BILATERAL INGUINAL DIRECT AND INDIRECT, FEMORAL, AND OBTURATOR  HERNIAS;  Surgeon: Adin Hector, MD;  Location: [facility];  Service: General;  Laterality: Bilateral;  . INSERTION OF MESH N/A 01/16/2013   Procedure: INSERTION OF MESH;  Surgeon: Adin Hector, MD;  Location: [facility];  Service: General;  Laterality: N/A;  . LUMBAR LAMINECTOMY    . TONSILLECTOMY AND ADENOIDECTOMY    . TOTAL HIP ARTHROPLASTY  2005   L   Social History   Socioeconomic History  . Marital status: Married    Spouse name: [name]  . Number of children: 2  . Years of education: None  . Highest education level: None  Social Needs  . Financial resource strain: None  . Food insecurity - worry: None  . Food insecurity - inability: None  . Transportation needs - medical: None  . Transportation needs - non-medical: None  Occupational History  . Occupation: retired     Fish farm manager: RETIRED  Tobacco Use  . Smoking status: Former Smoker    Years: 10.00    Types: Pipe, Cigars    Last attempt to quit: 02/16/1979    Years since quitting: 37.9  . Smokeless tobacco: Never Used  . Tobacco comment: Quit about age 89   Substance and Sexual Activity  . Alcohol use: No    Alcohol/week: 2.4 oz    Types: 4 Standard drinks or equivalent per week  . Drug use: No  . Sexual activity: No  Other Topics Concern  . None  Social History Narrative   L[facility] at [facility] since 2013   Married - [name]   Former smoker -stopped 1981   Alcohol -wine occasionally    Exercise - walking, Ne-step    POA, Living Will   [employer] with walker   Family History  Problem Relation Age of Onset  . Stroke Mother 56  . Hypertension Mother   . Diabetes Mother   . Heart disease Mother   . Rectal cancer Father 72  . Heart disease Father        in 57s  . Cancer Father        colorectal  . Nephritis Sister   . Seizures Brother        died as child   Allergies  Allergen Reactions  . Latex Rash  . Tape Rash    Paper tape is ok to use    Prior to Admission medications   Medication Sig Start Date End  Date Taking? Authorizing Provider  aspirin 81 MG tablet Take 81 mg by mouth every morning.    Yes [provider]  atorvastatin (LIPITOR) 20 MG tablet Take one tablet by mouth once daily for cholesterol Patient taking differently: Take 20 mg by mouth daily.  03/02/16  Yes Estill Dooms, MD  B Complex-C (B-COMPLEX WITH VITAMIN C) tablet Take 1 tablet by mouth daily.  Yes [provider]  bimatoprost (LUMIGAN) 0.01 % SOLN Place 1 drop into both eyes at bedtime.   Yes [provider]  buPROPion (WELLBUTRIN XL) 150 MG 24 hr tablet Take 150 mg by mouth every morning. 05/22/16  Yes [provider]  Cholecalciferol (VITAMIN D3) 1000 units CAPS Take 1,000 Units by mouth daily.   Yes [provider]  clonazePAM (KLONOPIN) 0.5 MG tablet Take 0.25 mg by mouth at bedtime as needed for anxiety (sleep). Take one daily for anxiety  10/14/15  Yes [provider]  fluticasone (CUTIVATE) 0.05 % cream Apply 1 application topically 2 (two) times daily as needed (irritation).  05/15/16  Yes [provider]  ketoconazole (NIZORAL) 2 % cream Apply 1 application topically daily as needed for irritation.  05/15/16  Yes [provider]  Javier Docker Oil 500 MG CAPS Take 500 mg by mouth every morning.    Yes [provider]  levothyroxine (SYNTHROID) 50 MCG tablet Take 1/2 tablet by mouth on Sunday mornings and Take one tablet by mouth every other morning for thryoid. Patient taking differently: Take 25-50 mcg by mouth See admin instructions. 67mcg on Sunday. Then 3mcg Mon/Wed/Fri 05/16/15  Yes Estill Dooms, MD  linaclotide [facility]) 145 MCG CAPS capsule Take 1 capsule (145 mcg total) by mouth daily before breakfast. 09/01/16  Yes [name], MD  mirtazapine (REMERON) 30 MG tablet Take 30 mg by mouth at bedtime.  02/16/16  Yes [provider]  Multiple Vitamins-Minerals (ICAPS PO) Take 1 tablet by mouth daily.   Yes [provider]    polyethylene glycol (MIRALAX / GLYCOLAX) packet Take 17 g by mouth daily as needed for mild constipation.   Yes [provider]  sertraline (ZOLOFT) 100 MG tablet Take 200 mg by mouth every morning.    Yes [provider]  tamsulosin (FLOMAX) 0.4 MG CAPS capsule TAKE 1 CAPSULE (0.4 MG TOTAL) BY MOUTH DAILY. 05/24/14  Yes [provider]  topiramate (TOPAMAX) 25 MG tablet TAKE ONE TABLET BY MOUTH EVERY MORNING AND 2 TABLETS IN THE EVENING Patient taking differently: Take 25-50 mg by mouth See admin instructions. 25mg  in the morning and 50mg  in the evening 01/01/16  Yes Dennie Bible, NP   Dg Chest 2 View  Result Date: 01/11/2017 CLINICAL DATA:  Weakness for 1 day EXAM: CHEST  2 VIEW COMPARISON:  06/08/2016 FINDINGS: The heart size and mediastinal contours are within normal limits. Both lungs are clear. The visualized skeletal structures are unremarkable. : Inter positioning beneath the right diaphragm is felt to contribute to lucency beneath the right diaphragm. IMPRESSION: No active cardiopulmonary disease. Electronically Signed   By: Donavan Foil M.D.   On: 01/11/2017 00:29   Ct Head Wo Contrast  Result Date: 01/10/2017 CLINICAL DATA:  Fall on Thursday.  Weakness. EXAM: CT HEAD WITHOUT CONTRAST TECHNIQUE: Contiguous axial images were obtained from the base of the skull through the vertex without intravenous contrast. COMPARISON:  05/13/2016 FINDINGS: Brain: There is atrophy and chronic small vessel disease changes. No acute intracranial abnormality. Specifically, no hemorrhage, hydrocephalus, mass lesion, acute infarction, or significant intracranial injury. Vascular: No hyperdense vessel or unexpected calcification. Skull: No acute calvarial abnormality. Sinuses/Orbits: Visualized paranasal sinuses and mastoids clear. Orbital soft tissues unremarkable. Other: None IMPRESSION: No acute intracranial abnormality. Atrophy, chronic microvascular disease. Electronically  Signed   By: Rolm Baptise M.D.   On: 01/10/2017 16:55   Ct Pelvis Wo Contrast  Result Date: 01/10/2017 CLINICAL DATA:  83 year old male fell yesterday onto left hip. Left hip pain. Surgery 15 years ago. Subsequent encounter. EXAM: CT PELVIS WITHOUT CONTRAST TECHNIQUE: Multidetector CT imaging of the pelvis was performed following the standard protocol without intravenous contrast. COMPARISON:  01/08/2017 plain film exam. FINDINGS: Urinary Tract: Evaluation of bladder limited by lack of contrast and streak artifact. Bowel: Evaluation of bowel limited by lack of contrast and streak artifact Vascular/Lymphatic: Calcified aorta and iliac arteries. No adenopathy. Reproductive:  Evaluation prostate gland limited by streak artifact. Other:  Hydroceles. Musculoskeletal: Post left hip replacement. Nondisplaced fracture left acetabulum/ilium medial to the left acetabular screws. Marked right hip joint degenerative changes. Prominent degenerative changes lower lumbar spine. IMPRESSION: Post left hip replacement. Nondisplaced fracture left acetabulum/ilium medial to the left acetabular screws (series 4, images 29 through 33). Marked right hip joint degenerative changes. Prominent degenerative changes lower lumbar spine. Electronically Signed   By: Genia Del M.D.   On: 01/10/2017 17:00   Mr Cervical Spine Wo Contrast  Result Date: 01/10/2017 CLINICAL DATA:  83 y/o  M; status post fall and incontinence. EXAM: MRI CERVICAL SPINE WITHOUT CONTRAST TECHNIQUE: Multiplanar, multisequence MR imaging of the cervical spine was performed. No intravenous contrast was administered. COMPARISON:  11/16/2016 MRI of the cervical spine. 11/10/2011 CT of the cervical spine. FINDINGS: Alignment: Stable straightening of cervical lordosis. Stable grade 1 C7-T1 anterolisthesis. Vertebrae: No fracture, evidence of discitis, or bone lesion. Vertebral body heights are preserved. No soft tissue edema or prevertebral fluid collection to  suggest ligamentous injury. 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 No new abnormal cord signal. No evidence for cord hemorrhage. Posterior Fossa, vertebral arteries, paraspinal tissues: Negative. Disc levels:  Congenital cervical spinal canal stenosis. C2-3: Stable disc osteophyte complex eccentric to the left and right greater than left uncovertebral and facet hypertrophy. Severe right and mild left foraminal stenosis. Moderate canal stenosis with left anterior cord impingement and flattening. C3-4: Stable disc osteophyte complex and extensive uncovertebral and facet hypertrophy. Severe bilateral foraminal stenosis. Moderate canal stenosis with cord impingement and flattening. C4-5: Stable disc osteophyte complex, central protrusion, and left-greater-than-right uncovertebral/ facet hypertrophy. Moderate right and severe left foraminal stenosis. Severe canal stenosis with cord flattening. C5-6: Stable disc osteophyte complex with bilateral uncovertebral and facet hypertrophy. Moderate to severe bilateral foraminal stenosis. Severe canal stenosis with cord flattening. C6-7: Stable disc osteophyte complex with bilateral uncovertebral and facet hypertrophy greater on the left. Moderate right and severe left foraminal stenosis. Moderate canal stenosis with anterior cord impingement and flattening. C7-T1: Stable grade 1 anterolisthesis with uncovered disc bulge and mild bilateral uncovertebral and facet hypertrophy. Mild bilateral foraminal and canal stenosis. IMPRESSION: 1. No acute osseous abnormality or findings of ligamentous injury. 2. Stable right lateral C4-5 cord signal likely representing chronic compressive myelopathy. 3. 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 4. Stable severe C4-5 and C5-6 canal stenosis. Moderate C2-3, C3-4, C6-7 canal stenosis. 5. Stable multiple  levels of high-grade foraminal stenosis as above. Electronically Signed   By: Kristine Garbe M.D.   On: 01/10/2017 21:47    Positive ROS: All other systems have been reviewed and were otherwise negative with the exception of those mentioned in the HPI and as above.  Physical Exam: General: Alert, no acute distress Cardiovascular: No pedal edema Respiratory: No cyanosis, no use of accessory musculature GI: No organomegaly, abdomen is soft and non-tender Skin: No lesions in the area  of chief complaint Neurologic: Sensation intact distally Psychiatric: Patient is competent for consent with normal mood and affect Lymphatic: No axillary or cervical lymphadenopathy  MUSCULOSKELETAL: Examination of the left hip reveals a well-healed posterior hip incision.  No significant pain with passive logrolling of the hip.  He is neurovascularly intact.  He has pain with straight leg raising.  Assessment: Status post previous left total hip replacement without complication. Nondisplaced, incomplete posterior column left acetabulum fracture, amenable to conservative treatment.  Plan: I discussed the findings with the patient and his family.  Fracture should heal fine without any surgical intervention.  I recommend touchdown weightbearing left lower extremity with walker.  Physical therapy and occupational therapy evaluation.  Pain control and DVT prophylaxis per primary team.  Patient will undergo disposition planning.  I will see the patient back in the office in 4 weeks for repeat evaluation.    Bertram Savin, MD Cell [phone]    01/11/2017 7:57 AM

## 2017-01-11 NOTE — Progress Notes (Signed)
[facility] paged patient refused to allow telemetry monitor to be placed after he removed it. Attempting to get OOB and not able to reorient patient. He then became combative as I attempted to place monitor on him. New orders were received. Will continue to monitor. Arthor Captain LPN

## 2017-01-11 NOTE — ED Notes (Signed)
Attempted to cal report  

## 2017-01-11 NOTE — Progress Notes (Signed)
PROGRESS NOTE    Jose Leach  ZDG:[id number] DOB: 1932/08/14 DOA: 01/10/2017 PCP: [name], MD    Brief Narrative: Jose Leach  is a 83 y.o. male, w hypertension, hyperlipidemia, hypothyroidism, PAD,  cervical spinal stenosis, apparently c/o mechanical fall on Thursday and dveloped left hip pain.  He was previously ambulatory with walker and was unable after fall to bear weight and therefore went to ED for evaluation on Saturday.  Pt had xray of the left hip negative for fracture.  Pt was sent back to independent living and apparently was now bedbound and requiring hoyer lift or 2 person max assist for transfer due to pain and weakness.  Pt per family had had diarrhea prior to Thursday which might have weakened him and led to fall.  Pt is not currently having diarrhea.  Pt was transferred to SNF at [facility] and evaluated by Dr. [name] and sent to ER for evaluation.      Assessment & Plan:   Principal Problem:   Hip pain Active Problems:   Hypothyroidism   Bradycardia, sinus   Anemia  1-Left Hip Pain; Non displaced left acetabular fracture; evaluated by Dr Lyla Glassing. Who recommend non operative measure. Touchdown weightbearing therapy.  Pain management.  PT, OT evaluation. Patient will required SNF>   2-Right lateral C 4-5 stenosis, chronic compressive Myelopathy; multiple C spine stenosis.  Motor strength upper extremity 5/5, bilateral lower extremities weakness. Difficult to differentia also due to pain.  -neurosurgery consulted.   3-Acute encephalopathy;  He was confuse overnight. Also notice to be confuse by daughter today.  Avoid IV narcotics. I have discontinue dilaudid. Cautious with haldol.   Bradycardia, chronic, asymptomatic.   PAD; aspirin , lipitor.   Anemia; mild; follow trend.   Protein calorie malnutrition prostat   Tinea cruris Cont nizoral cream  H/o Bph Cont flomax  Anxiety/ depression Cont clonazepam Cont  wellbutrin      DVT prophylaxis: Lovenox Code Status: full code.  Family Communication: care discussed with wife and daughter  Disposition Plan: needs SNF  Consultants:   Orthopedic.   Neurosurgery    Procedures:   none   Antimicrobials:  none   Subjective: He is alert, he was confuse and agitated last nigh. Daughters notice that he has been confuse.  He is complaining of left hip pain. He denies dyspnea or chest pain. He has not been able to walk since after the fall because of left hip pain, left leg pain, sharp , shooting pain in his leg. Also report numbness in his ankle.   Objective: Vitals:   01/10/17 2331 01/11/17 0109 01/11/17 0526 01/11/17 1337  BP: (!) 145/70 (!) 204/67 (!) 170/62 133/73  Pulse: 64 (!) 54 (!) 54 68  Resp: 17 17 16 18   Temp:  98.1 F (36.7 C) 98.2 F (36.8 C) 98.6 F (37 C)  TempSrc:  Oral Oral Oral  SpO2: 99% 100% 97% 96%  Weight:  65.9 kg (145 lb 4.5 oz)    Height:  6' (1.829 m)      Intake/Output Summary (Last 24 hours) at 01/11/2017 1404 Last data filed at 01/11/2017 1000 Gross per 24 hour  Intake -  Output 200 ml  Net -200 ml   Filed Weights   01/10/17 1433 01/11/17 0109  Weight: 65.8 kg (145 lb) 65.9 kg (145 lb 4.5 oz)    Examination:  General exam: Appears calm and comfortable  Respiratory system: Clear to auscultation. Respiratory effort normal. Cardiovascular system: S1 &  S2 heard, RRR. No JVD, murmurs, rubs, gallops or clicks. No pedal edema. Gastrointestinal system: Abdomen is nondistended, soft and nontender. No organomegaly or masses felt. Normal bowel sounds heard. Central nervous system: Alert and oriented. Upper extremity  5/5  Strenghn. Lower extremities right able to raise right leg with limitation also able to raise left leg but complaints of pain.  Extremities: no edema Skin: No rashes, lesions or ulcers    Data Reviewed: I have personally reviewed following labs and imaging studies  CBC: Recent  Labs  Lab 01/08/17 1356 01/10/17 1528 01/11/17 0532  WBC 6.6 6.9 6.3  NEUTROABS 4.6 5.3  --   HGB 12.4* 12.4* 11.9*  HCT 38.6* 39.3 38.1*  MCV 96.5 97.3 96.2  PLT 141* 152 376*   Basic Metabolic Panel: Recent Labs  Lab 01/08/17 1356 01/10/17 1528 01/11/17 0532  NA 140 140 141  K 4.1 4.8 4.1  CL 111 112* 111  CO2 23 21* 25  GLUCOSE 95 87 92  BUN 35* 30* 28*  CREATININE 1.26* 1.13 1.07  CALCIUM 8.7* 8.8* 8.3*   GFR: Estimated Creatinine Clearance: 49.6 mL/min (by C-G formula based on SCr of 1.07 mg/dL). Liver Function Tests: Recent Labs  Lab 01/10/17 1528 01/11/17 0532  AST 18 16  ALT 17 15*  ALKPHOS 38 38  BILITOT 0.7 0.7  PROT 6.2* 5.3*  ALBUMIN 3.2* 2.8*   No results for input(s): LIPASE, AMYLASE in the last 168 hours. No results for input(s): AMMONIA in the last 168 hours. Coagulation Profile: No results for input(s): INR, PROTIME in the last 168 hours. Cardiac Enzymes: No results for input(s): CKTOTAL, CKMB, CKMBINDEX, TROPONINI in the last 168 hours. BNP (last 3 results) No results for input(s): PROBNP in the last 8760 hours. HbA1C: No results for input(s): HGBA1C in the last 72 hours. CBG: No results for input(s): GLUCAP in the last 168 hours. Lipid Profile: No results for input(s): CHOL, HDL, LDLCALC, TRIG, CHOLHDL, LDLDIRECT in the last 72 hours. Thyroid Function Tests: Recent Labs    01/11/17 0532  TSH 4.866*   Anemia Panel: No results for input(s): VITAMINB12, FOLATE, FERRITIN, TIBC, IRON, RETICCTPCT in the last 72 hours. Sepsis Labs: No results for input(s): PROCALCITON, LATICACIDVEN in the last 168 hours.  Recent Results (from the past 240 hour(s))  MRSA PCR Screening     Status: Abnormal   Collection Time: 01/11/17  1:23 AM  Result Value Ref Range Status   MRSA by PCR POSITIVE (A) NEGATIVE Final    Comment:        The GeneXpert MRSA Assay (FDA approved for NASAL specimens only), is one component of a comprehensive MRSA  colonization surveillance program. It is not intended to diagnose MRSA infection nor to guide or monitor treatment for MRSA infections. RESULT CALLED TO, READ BACK BY AND VERIFIED WITHKalman Drape RN [phone] 01/11/17 A BROWNING          Radiology Studies: Dg Chest 2 View  Result Date: 01/11/2017 CLINICAL DATA:  Weakness for 1 day EXAM: CHEST  2 VIEW COMPARISON:  06/08/2016 FINDINGS: The heart size and mediastinal contours are within normal limits. Both lungs are clear. The visualized skeletal structures are unremarkable. : Inter positioning beneath the right diaphragm is felt to contribute to lucency beneath the right diaphragm. IMPRESSION: No active cardiopulmonary disease. Electronically Signed   By: Donavan Foil M.D.   On: 01/11/2017 00:29   Ct Head Wo Contrast  Result Date: 01/10/2017 CLINICAL DATA:  Fall on Thursday.  Weakness. EXAM: CT HEAD WITHOUT CONTRAST TECHNIQUE: Contiguous axial images were obtained from the base of the skull through the vertex without intravenous contrast. COMPARISON:  05/13/2016 FINDINGS: Brain: There is atrophy and chronic small vessel disease changes. No acute intracranial abnormality. Specifically, no hemorrhage, hydrocephalus, mass lesion, acute infarction, or significant intracranial injury. Vascular: No hyperdense vessel or unexpected calcification. Skull: No acute calvarial abnormality. Sinuses/Orbits: Visualized paranasal sinuses and mastoids clear. Orbital soft tissues unremarkable. Other: None IMPRESSION: No acute intracranial abnormality. Atrophy, chronic microvascular disease. Electronically Signed   By: Rolm Baptise M.D.   On: 01/10/2017 16:55   Ct Pelvis Wo Contrast  Result Date: 01/10/2017 CLINICAL DATA:  83 year old male fell yesterday onto left hip. Left hip pain. Surgery 15 years ago. Subsequent encounter. EXAM: CT PELVIS WITHOUT CONTRAST TECHNIQUE: Multidetector CT imaging of the pelvis was performed following the standard protocol without  intravenous contrast. COMPARISON:  01/08/2017 plain film exam. FINDINGS: Urinary Tract: Evaluation of bladder limited by lack of contrast and streak artifact. Bowel: Evaluation of bowel limited by lack of contrast and streak artifact Vascular/Lymphatic: Calcified aorta and iliac arteries. No adenopathy. Reproductive:  Evaluation prostate gland limited by streak artifact. Other:  Hydroceles. Musculoskeletal: Post left hip replacement. Nondisplaced fracture left acetabulum/ilium medial to the left acetabular screws. Marked right hip joint degenerative changes. Prominent degenerative changes lower lumbar spine. IMPRESSION: Post left hip replacement. Nondisplaced fracture left acetabulum/ilium medial to the left acetabular screws (series 4, images 29 through 33). Marked right hip joint degenerative changes. Prominent degenerative changes lower lumbar spine. Electronically Signed   By: Genia Del M.D.   On: 01/10/2017 17:00   Mr Cervical Spine Wo Contrast  Result Date: 01/10/2017 CLINICAL DATA:  83 y/o  M; status post fall and incontinence. EXAM: MRI CERVICAL SPINE WITHOUT CONTRAST TECHNIQUE: Multiplanar, multisequence MR imaging of the cervical spine was performed. No intravenous contrast was administered. COMPARISON:  11/16/2016 MRI of the cervical spine. 11/10/2011 CT of the cervical spine. FINDINGS: Alignment: Stable straightening of cervical lordosis. Stable grade 1 C7-T1 anterolisthesis. Vertebrae: No fracture, evidence of discitis, or bone lesion. Vertebral body heights are preserved. No soft tissue edema or prevertebral fluid collection to suggest ligamentous injury. 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 No new abnormal cord signal. No evidence for cord hemorrhage. Posterior Fossa, vertebral arteries, paraspinal tissues: Negative. Disc levels:  Congenital cervical spinal canal stenosis. C2-3: Stable disc  osteophyte complex eccentric to the left and right greater than left uncovertebral and facet hypertrophy. Severe right and mild left foraminal stenosis. Moderate canal stenosis with left anterior cord impingement and flattening. C3-4: Stable disc osteophyte complex and extensive uncovertebral and facet hypertrophy. Severe bilateral foraminal stenosis. Moderate canal stenosis with cord impingement and flattening. C4-5: Stable disc osteophyte complex, central protrusion, and left-greater-than-right uncovertebral/ facet hypertrophy. Moderate right and severe left foraminal stenosis. Severe canal stenosis with cord flattening. C5-6: Stable disc osteophyte complex with bilateral uncovertebral and facet hypertrophy. Moderate to severe bilateral foraminal stenosis. Severe canal stenosis with cord flattening. C6-7: Stable disc osteophyte complex with bilateral uncovertebral and facet hypertrophy greater on the left. Moderate right and severe left foraminal stenosis. Moderate canal stenosis with anterior cord impingement and flattening. C7-T1: Stable grade 1 anterolisthesis with uncovered disc bulge and mild bilateral uncovertebral and facet hypertrophy. Mild bilateral foraminal and canal stenosis. IMPRESSION: 1. No acute osseous abnormality or findings of ligamentous injury. 2. Stable right lateral C4-5 cord signal likely  representing chronic compressive myelopathy. 3. 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 4. Stable severe C4-5 and C5-6 canal stenosis. Moderate C2-3, C3-4, C6-7 canal stenosis. 5. Stable multiple levels of high-grade foraminal stenosis as above. Electronically Signed   By: Kristine Garbe M.D.   On: 01/10/2017 21:47        Scheduled Meds: . aspirin EC  81 mg Oral q morning - 10a  . atorvastatin  20 mg Oral Daily  . B-complex with vitamin C  1 tablet Oral Daily  . buPROPion  150 mg Oral BH-q7a  . Chlorhexidine Gluconate Cloth  6 each  Topical Q0600  . cholecalciferol  1,000 Units Oral Daily  . enoxaparin (LOVENOX) injection  40 mg Subcutaneous Q24H  . feeding supplement (PRO-STAT SUGAR FREE 64)  30 mL Oral BID  . latanoprost  1 drop Both Eyes QHS  . [START ON 01/16/2017] levothyroxine  25 mcg Oral Once per day on Sun  . [START ON 01/12/2017] levothyroxine  50 mcg Oral Once per day on Mon Wed Fri  . linaclotide  145 mcg Oral QAC breakfast  . mirtazapine  30 mg Oral QHS  . mupirocin ointment  1 application Nasal BID  . omega-3 acid ethyl esters  1,000 mg Oral q morning - 10a  . sertraline  200 mg Oral Daily  . tamsulosin  0.4 mg Oral QPC supper  . topiramate  25 mg Oral Daily  . topiramate  50 mg Oral QHS   Continuous Infusions: . sodium chloride 75 mL/hr at 01/11/17 0026     LOS: 1 day    Time spent: 35 minutes.     Elmarie Shiley, MD [facility] Pager [phone]  If 7PM-7AM, please contact night-coverage [email or web address] Password [email or web address] 01/11/2017, 2:04 PM

## 2017-01-12 ENCOUNTER — Encounter: Payer: Self-pay | Admitting: Internal Medicine

## 2017-01-12 DIAGNOSIS — M25552 Pain in left hip: Secondary | ICD-10-CM | POA: Diagnosis not present

## 2017-01-12 MED ORDER — ACETAMINOPHEN 325 MG PO TABS: 650.0000 mg | ORAL_TABLET | Freq: Four times a day (QID) | ORAL | Status: DC | PRN

## 2017-01-12 MED ORDER — CLONAZEPAM 0.5 MG PO TABS: 0.2500 mg | ORAL_TABLET | Freq: Every evening | ORAL | 0 refills | Status: DC | PRN

## 2017-01-12 MED ORDER — MIRTAZAPINE 30 MG PO TABS: 30.0000 mg | ORAL_TABLET | Freq: Every day | ORAL | 0 refills | Status: DC

## 2017-01-12 MED ORDER — HYDROCODONE-ACETAMINOPHEN 5-325 MG PO TABS: 1.0000 | ORAL_TABLET | ORAL | 0 refills | Status: DC | PRN

## 2017-01-12 NOTE — Social Work (Signed)
CSW will set up transport once SNF returns call as CSW had to update FL2 based on SNF's additional requests and have doctor cosign again.  [facility] admission staff went to a meeting at 2:30pm when CSW called to advise of the updated FL2.  CSW will f/u for transition to SNF.  Elissa Hefty, LCSW Clinical Social Worker [phone]

## 2017-01-12 NOTE — Care Management CC44 (Signed)
Condition Code 44 Documentation Completed  Patient Details  Name: Jose Leach MRN: [id number] Date of Birth: [birth year]   Condition Code 44 given:   yes Patient signature on Condition Code 44 notice:   declined to sign  Documentation of 2 MD's agreement:   yes Code 44 added to claim:   yes    Marilu Favre, RN 01/12/2017, 3:13 PM

## 2017-01-12 NOTE — Care Management CC44 (Signed)
Condition Code 44 Documentation Completed  Patient Details  Name: Jose Leach MRN: [id number] Date of Birth: 1932/05/16   Condition Code 44 given:    Patient signature on Condition Code 44 notice:    Documentation of 2 MD's agreement:    Code 44 added to claim:       Marilu Favre, RN 01/12/2017, 3:13 PM

## 2017-01-12 NOTE — Discharge Summary (Signed)
[facility]  Physician Discharge Summary   Patient ID: Jose Leach MRN: [id number] DOB/AGE: 81-Jul-1934 81 y.o.  Admit date: 01/10/2017 Discharge date: 01/12/2017  PCP: [name], MD  DISCHARGE DIAGNOSES:  Principal Problem:   Hip pain Active Problems:   Hypothyroidism   Bradycardia, sinus   Anemia   RECOMMENDATIONS FOR OUTPATIENT FOLLOW UP: 1. CBC and basic metabolic panel in 1 week 2. Outpatient follow-up with orthopedics, Dr. Lyla Glassing in 4 weeks 3. Outpatient follow-up with neurosurgery, Dr. Kathyrn Sheriff, in 1-2 months. 4. Monitor blood pressure   DISCHARGE CONDITION: fair  Diet recommendation: Heart healthy  Filed Weights   01/10/17 1433 01/11/17 0109  Weight: 65.8 kg (145 lb) 65.9 kg (145 lb 4.5 oz)    INITIAL HISTORY: LawrenceShornackis a83 y.o.male,w hypertension, hyperlipidemia, hypothyroidism, PAD, cervical spinal stenosis, apparently c/o mechanical fall on Thursday and dveloped left hip pain. He was previously ambulatory with walker and was unable after fall to bear weight and therefore went to ED for evaluation on Saturday. Pt had xray of the left hip negative for fracture. Pt was sent back to independent living and apparently was bedbound and requiring hoyer lift or 2 person max assist for transfer due to pain and weakness. Per family had had diarrhea prior to Thursday which might have weakened him and led to fall. Pt was transferred to SNF at [facility] and evaluated by Dr. [name] and sent to ER for evaluation.   Consultations:  Orthopedics: Dr. Lyla Glassing  Neurosurgery: Dr. Bishop Limbo COURSE:   Left Hip Pain secondary to Non displaced left acetabular fracture Evaluated by Dr Lyla Glassing who recommended non operative measure. Touchdown weightbearing therapy. Pain management.  Patient seen by physical therapy.  Skilled nursing facility is recommended.  Outpatient follow-up with orthopedics in 4 weeks.      Right lateral C 4-5 stenosis, chronic compressive Myelopathy; multiple C spine stenosis.  Patient has normal strength upper and lower extremities.  Neurosurgery was consulted.  There is no need for any acute intervention.  They feel the patient may benefit from surgery however would like to discuss this once patient has recovered from his hip process.  Outpatient follow-up   Acute encephalopathy Patient did have episode of sundowning and his IV narcotics were discontinued.  Patient's mentation has returned to baseline.     Bradycardia, chronic, asymptomatic Stable.   PAD Continue aspirin, lipitor.  Elevated blood pressure most likely due to pain issues.  Continue to monitor.  Not noted to be on any blood pressure lowering agent.  Normocytic Anemia Counts have been stable.  No evidence for good bleeding.  Protein calorie malnutrition Continue nutritional supplements   Tinea cruris Continue nizoral cream  H/o Bph Continue flomax  Anxiety/ depression Continue clonazepam. Continue wellbutrin.  History of hypothyroidism Continue home medications  Patient feels well.  Discussed in detail with patient and his wife and daughter.  Patient seems to be stable for discharge to skilled nursing facility for short-term rehab.     PERTINENT LABS:  The results of significant diagnostics from this hospitalization (including imaging, microbiology, ancillary and laboratory) are listed below for reference.    Microbiology: Recent Results (from the past 240 hour(s))  MRSA PCR Screening     Status: Abnormal   Collection Time: 01/11/17  1:23 AM  Result Value Ref Range Status   MRSA by PCR POSITIVE (A) NEGATIVE Final    Comment:        The GeneXpert MRSA Assay (FDA approved for NASAL  specimens only), is one component of a comprehensive MRSA colonization surveillance program. It is not intended to diagnose MRSA infection nor to guide or monitor treatment for MRSA  infections. RESULT CALLED TO, READ BACK BY AND VERIFIED WITHKalman Drape RN 0327 01/11/17 A BROWNING      Labs: Basic Metabolic Panel: Recent Labs  Lab 01/08/17 1356 01/10/17 1528 01/11/17 0532  NA 140 140 141  K 4.1 4.8 4.1  CL 111 112* 111  CO2 23 21* 25  GLUCOSE 95 87 92  BUN 35* 30* 28*  CREATININE 1.26* 1.13 1.07  CALCIUM 8.7* 8.8* 8.3*   Liver Function Tests: Recent Labs  Lab 01/10/17 1528 01/11/17 0532  AST 18 16  ALT 17 15*  ALKPHOS 38 38  BILITOT 0.7 0.7  PROT 6.2* 5.3*  ALBUMIN 3.2* 2.8*   CBC: Recent Labs  Lab 01/08/17 1356 01/10/17 1528 01/11/17 0532  WBC 6.6 6.9 6.3  NEUTROABS 4.6 5.3  --   HGB 12.4* 12.4* 11.9*  HCT 38.6* 39.3 38.1*  MCV 96.5 97.3 96.2  PLT 141* 152 143*     IMAGING STUDIES Dg Chest 2 View  Result Date: 01/11/2017 CLINICAL DATA:  Weakness for 1 day EXAM: CHEST  2 VIEW COMPARISON:  06/08/2016 FINDINGS: The heart size and mediastinal contours are within normal limits. Both lungs are clear. The visualized skeletal structures are unremarkable. : Inter positioning beneath the right diaphragm is felt to contribute to lucency beneath the right diaphragm. IMPRESSION: No active cardiopulmonary disease. Electronically Signed   By: Donavan Foil M.D.   On: 01/11/2017 00:29   Ct Head Wo Contrast  Result Date: 01/10/2017 CLINICAL DATA:  Fall on Thursday.  Weakness. EXAM: CT HEAD WITHOUT CONTRAST TECHNIQUE: Contiguous axial images were obtained from the base of the skull through the vertex without intravenous contrast. COMPARISON:  05/13/2016 FINDINGS: Brain: There is atrophy and chronic small vessel disease changes. No acute intracranial abnormality. Specifically, no hemorrhage, hydrocephalus, mass lesion, acute infarction, or significant intracranial injury. Vascular: No hyperdense vessel or unexpected calcification. Skull: No acute calvarial abnormality. Sinuses/Orbits: Visualized paranasal sinuses and mastoids clear. Orbital soft tissues  unremarkable. Other: None IMPRESSION: No acute intracranial abnormality. Atrophy, chronic microvascular disease. Electronically Signed   By: Rolm Baptise M.D.   On: 01/10/2017 16:55   Ct Pelvis Wo Contrast  Result Date: 01/10/2017 CLINICAL DATA:  83 year old male fell yesterday onto left hip. Left hip pain. Surgery 15 years ago. Subsequent encounter. EXAM: CT PELVIS WITHOUT CONTRAST TECHNIQUE: Multidetector CT imaging of the pelvis was performed following the standard protocol without intravenous contrast. COMPARISON:  01/08/2017 plain film exam. FINDINGS: Urinary Tract: Evaluation of bladder limited by lack of contrast and streak artifact. Bowel: Evaluation of bowel limited by lack of contrast and streak artifact Vascular/Lymphatic: Calcified aorta and iliac arteries. No adenopathy. Reproductive:  Evaluation prostate gland limited by streak artifact. Other:  Hydroceles. Musculoskeletal: Post left hip replacement. Nondisplaced fracture left acetabulum/ilium medial to the left acetabular screws. Marked right hip joint degenerative changes. Prominent degenerative changes lower lumbar spine. IMPRESSION: Post left hip replacement. Nondisplaced fracture left acetabulum/ilium medial to the left acetabular screws (series 4, images 29 through 33). Marked right hip joint degenerative changes. Prominent degenerative changes lower lumbar spine. Electronically Signed   By: Genia Del M.D.   On: 01/10/2017 17:00   Mr Cervical Spine Wo Contrast  Result Date: 01/10/2017 CLINICAL DATA:  84 y/o  M; status post fall and incontinence. EXAM: MRI CERVICAL SPINE WITHOUT CONTRAST TECHNIQUE: Multiplanar,  multisequence MR imaging of the cervical spine was performed. No intravenous contrast was administered. COMPARISON:  11/16/2016 MRI of the cervical spine. 11/10/2011 CT of the cervical spine. FINDINGS: Alignment: Stable straightening of cervical lordosis. Stable grade 1 C7-T1 anterolisthesis. Vertebrae: No fracture, evidence  of discitis, or bone lesion. Vertebral body heights are preserved. No soft tissue edema or prevertebral fluid collection to suggest ligamentous injury. 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 No new abnormal cord signal. No evidence for cord hemorrhage. Posterior Fossa, vertebral arteries, paraspinal tissues: Negative. Disc levels:  Congenital cervical spinal canal stenosis. C2-3: Stable disc osteophyte complex eccentric to the left and right greater than left uncovertebral and facet hypertrophy. Severe right and mild left foraminal stenosis. Moderate canal stenosis with left anterior cord impingement and flattening. C3-4: Stable disc osteophyte complex and extensive uncovertebral and facet hypertrophy. Severe bilateral foraminal stenosis. Moderate canal stenosis with cord impingement and flattening. C4-5: Stable disc osteophyte complex, central protrusion, and left-greater-than-right uncovertebral/ facet hypertrophy. Moderate right and severe left foraminal stenosis. Severe canal stenosis with cord flattening. C5-6: Stable disc osteophyte complex with bilateral uncovertebral and facet hypertrophy. Moderate to severe bilateral foraminal stenosis. Severe canal stenosis with cord flattening. C6-7: Stable disc osteophyte complex with bilateral uncovertebral and facet hypertrophy greater on the left. Moderate right and severe left foraminal stenosis. Moderate canal stenosis with anterior cord impingement and flattening. C7-T1: Stable grade 1 anterolisthesis with uncovered disc bulge and mild bilateral uncovertebral and facet hypertrophy. Mild bilateral foraminal and canal stenosis. IMPRESSION: 1. No acute osseous abnormality or findings of ligamentous injury. 2. Stable right lateral C4-5 cord signal likely representing chronic compressive myelopathy. 3. Stable congenital cervical canal stenosis combined with advanced spinal  canal stenosis. 4. Stable severe C4-5 and C5-6 canal stenosis. Moderate C2-3, C3-4, C6-7 canal stenosis. 5. Stable multiple levels of high-grade foraminal stenosis as above. Electronically Signed   By: Kristine Garbe M.D.   On: 01/10/2017 21:47   Dg Hip Unilat With Pelvis 2-3 Views Left  Result Date: 01/08/2017 CLINICAL DATA:  [place] yesterday onto left hip. Complains of pain since fall. Left hip arthroplasty. EXAM: DG HIP (WITH OR WITHOUT PELVIS) 2-3V LEFT COMPARISON:  08/09/2016 FINDINGS: Left hip arthroplasty is located. The entire left femoral stem is visualized without a periprosthetic fracture. Pelvic bony ring is intact. Again noted is severe joint space narrowing along the superior right hip joint. Degenerative disc and endplate changes in the lower lumbar spine. IMPRESSION: No acute abnormality to the pelvis or left hip. Severe osteoarthritic changes in the right hip. Electronically Signed   By: Markus Daft M.D.   On: 01/08/2017 13:57    DISCHARGE EXAMINATION: Vitals:   01/11/17 0526 01/11/17 1337 01/11/17 2032 01/12/17 0600  BP: (!) 170/62 133/73 (!) 176/96 (!) 174/66  Pulse: (!) 54 68 82 (!) 55  Resp: 16 18 18 18   Temp: 98.2 F (36.8 C) 98.6 F (37 C) 98.8 F (37.1 C) 97.8 F (36.6 C)  TempSrc: Oral Oral Oral Oral  SpO2: 97% 96% 97% 98%  Weight:      Height:       General appearance: alert, cooperative, appears stated age and no distress Resp: clear to auscultation bilaterally Cardio: regular rate and rhythm, S1, S2 normal, no murmur, click, rub or gallop GI: soft, non-tender; bowel sounds normal; no masses,  no organomegaly Extremities: Somewhat diminished mobility in the left lower extremity leg injury.  DISPOSITION: Skilled  nursing facility  Discharge Instructions    Call MD for:  extreme fatigue   Complete by:  As directed    Call MD for:  persistant dizziness or light-headedness   Complete by:  As directed    Call MD for:  persistant nausea and vomiting    Complete by:  As directed    Call MD for:  severe uncontrolled pain   Complete by:  As directed    Call MD for:  temperature >100.4   Complete by:  As directed    Discharge instructions   Complete by:  As directed    See discharge summary for instructions.  You were cared for by a hospitalist during your hospital stay. If you have any questions about your discharge medications or the care you received while you were in the hospital after you are discharged, you can call the unit and asked to speak with the hospitalist on call if the hospitalist that took care of you is not available. Once you are discharged, your primary care physician will handle any further medical issues. Please note that NO REFILLS for any discharge medications will be authorized once you are discharged, as it is imperative that you return to your primary care physician (or establish a relationship with a primary care physician if you do not have one) for your aftercare needs so that they can reassess your need for medications and monitor your lab values. If you do not have a primary care physician, you can call [phone] for a physician referral.   Increase activity slowly   Complete by:  As directed       ALLERGIES:  Allergies  Allergen Reactions  . Latex Rash  . Tape Rash    Paper tape is ok to use      Current Discharge Medication List    START taking these medications   Details  acetaminophen (TYLENOL) 325 MG tablet Take 2 tablets (650 mg total) by mouth every 6 (six) hours as needed for mild pain (or Fever >/= 101).    HYDROcodone-acetaminophen (NORCO/VICODIN) 5-325 MG tablet Take 1 tablet by mouth every 4 (four) hours as needed for moderate pain. Qty: 20 tablet, Refills: 0      CONTINUE these medications which have CHANGED   Details  clonazePAM (KLONOPIN) 0.5 MG tablet Take 0.5 tablets (0.25 mg total) by mouth at bedtime as needed for anxiety (sleep). Take one daily for anxiety Qty: 30 tablet,  Refills: 0    mirtazapine (REMERON) 30 MG tablet Take 1 tablet (30 mg total) by mouth at bedtime. Qty: 30 tablet, Refills: 0      CONTINUE these medications which have NOT CHANGED   Details  aspirin 81 MG tablet Take 81 mg by mouth every morning.     atorvastatin (LIPITOR) 20 MG tablet Take one tablet by mouth once daily for cholesterol Qty: 90 tablet, Refills: 3    B Complex-C (B-COMPLEX WITH VITAMIN C) tablet Take 1 tablet by mouth daily.    bimatoprost (LUMIGAN) 0.01 % SOLN Place 1 drop into both eyes at bedtime.    buPROPion (WELLBUTRIN XL) 150 MG 24 hr tablet Take 150 mg by mouth every morning.    Cholecalciferol (VITAMIN D3) 1000 units CAPS Take 1,000 Units by mouth daily.    fluticasone (CUTIVATE) 0.05 % cream Apply 1 application topically 2 (two) times daily as needed (irritation).     ketoconazole (NIZORAL) 2 % cream Apply 1 application topically daily as needed for irritation.  Krill Oil 500 MG CAPS Take 500 mg by mouth every morning.     levothyroxine (SYNTHROID) 50 MCG tablet Take 1/2 tablet by mouth on Sunday mornings and Take one tablet by mouth every other morning for thryoid. Qty: 30 tablet, Refills: 0    linaclotide (LINZESS) 145 MCG CAPS capsule Take 1 capsule (145 mcg total) by mouth daily before breakfast. Qty: 30 capsule, Refills: 3    Multiple Vitamins-Minerals (ICAPS PO) Take 1 tablet by mouth daily.    polyethylene glycol (MIRALAX / GLYCOLAX) packet Take 17 g by mouth daily as needed for mild constipation.    sertraline (ZOLOFT) 100 MG tablet Take 200 mg by mouth every morning.     tamsulosin (FLOMAX) 0.4 MG CAPS capsule TAKE 1 CAPSULE (0.4 MG TOTAL) BY MOUTH DAILY.    topiramate (TOPAMAX) 25 MG tablet TAKE ONE TABLET BY MOUTH EVERY MORNING AND 2 TABLETS IN THE EVENING Qty: 90 tablet, Refills: 11         Follow-up Information    Swinteck, Aaron Edelman, MD. Schedule an appointment as soon as possible for a visit in 4 week(s).   Specialty:   Orthopedic Surgery Contact information: [place]. [address] [place] [address] [phone]        Consuella Lose, MD Follow up in 1 month(s).   Specialty:  Neurosurgery Why:  in 1-2 months for further discussion of cervical myelopathy. Contact information: [address] [place] [address] [place] [address] [phone]           TOTAL DISCHARGE TIME: 35 mins  Bonnielee Haff  [facility] Pager [phone]  01/12/2017, 12:40 PM

## 2017-01-12 NOTE — NC FL2 (Signed)
[place] MEDICAID FL2 LEVEL OF CARE SCREENING TOOL     IDENTIFICATION  Patient Name: Jose Leach Birthdate: May 20, 1932 Sex: male Admission Date (Current Location): 01/10/2017  [facility] and [place] Number:  Herbalist and Address:  [facility], [place] [address], [place], [place] [address]      Provider Number: [id number]  Attending Physician Name and Address:  Bonnielee Haff, MD  Relative Name and Phone Number:  [name], spouse, [phone]    Current Level of Care: Hospital Recommended Level of Care: [place] Prior Approval Number:    Date Approved/Denied:   PASRR Number: [id number]  Discharge Plan: SNF    Current Diagnoses: Patient Active Problem List   Diagnosis Date Noted  . Hip pain 01/10/2017  . Anemia 01/10/2017  . Major depression, recurrent, chronic ([place]) 11/17/2016  . CKD (chronic kidney disease) stage 3, GFR 30-59 ml/min (HCC) 11/17/2016  . History of fall 06/22/2016  . Hyperglycemia 06/22/2016  . Elevated TSH 06/22/2016  . CKD (chronic kidney disease) stage 2, GFR 60-89 ml/min 06/22/2016  . Edema 12/23/2015  . Callus 07/23/2014  . Fall 07/09/2014  . Depression 07/09/2014  . Pain in left hip 05/22/2014  . AMD (age-related macular degeneration), wet ([place]) 03/21/2014  . Cataract, nuclear 03/21/2014  . Glaucoma, compensated 03/21/2014  . Status post intraocular lens implant 03/21/2014  . Benign prostatic hyperplasia with urinary obstruction 12/27/2013  . Knee pain, right 09/25/2013  . Bilateral buttock pain 09/18/2013  . Other testicular hypofunction 09/14/2013  . Hip hematoma, right 09/09/2013  . Altered mental status 09/09/2013  . Benign essential tremor 04/18/2013  . 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 Bladder retention 02/05/2013  . Renal cyst, left 01/30/2013  . Femoral hernia, bilateral s/p lap repair 01/16/2013 01/16/2013  . Personal history of colonic polyps 01/05/2013  . Fecal  impaction ([place]) 10/11/2012  . Bilateral inguinal hernia 10/09/2012  . Urge incontinence 10/09/2012  . Bilateral leg edema 09/13/2012  . HTN (hypertension) 08/22/2012  . Unspecified vitamin D deficiency 08/06/2012  . Bradycardia, sinus 08/02/2012  . Constipation 07/24/2012  . Family history of colon cancer 07/24/2012  . Pseudoaphakia 12/16/2011  . Degeneration macular 11/02/2011  . Benign prostatic hyperplasia without lower urinary tract symptoms 10/11/2011  . Bladder neck obstruction 10/11/2011  . Hydrocele in adult 10/11/2011  . 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 Difficulty walking 04/05/2011  . Balance problems 04/05/2011  . Hammer toe 04/23/2010  . Unsteady gait 11/12/2009  . MORTON'S NEUROMA, LEFT 08/22/2007  . Anxiety state 06/07/2007  . ARTHRITIS 06/07/2007  . CAROTID ARTERY DISEASE 03/31/2007  . PVD (peripheral vascular disease) ([place]) 03/16/2007  . Hypothyroidism 11/10/2006  . HYPERLIPIDEMIA 11/10/2006  . MIGRAINE HEADACHE 03/14/2006  . PREMATURE ATRIAL CONTRACTIONS 03/14/2006  . Cervical spinal stenosis 03/14/2006  . ESOPHAGITIS 08/06/2002    Orientation RESPIRATION BLADDER Height & Weight     Self, Time, Situation, Place  Normal Continent, External catheter Weight: 65.9 kg (145 lb 4.5 oz) Height:  6' (182.9 cm)  BEHAVIORAL SYMPTOMS/MOOD NEUROLOGICAL BOWEL NUTRITION STATUS  (N/A)   Continent Diet(Please see DC Summary)  AMBULATORY STATUS COMMUNICATION OF NEEDS Skin   Limited Assist Verbally Normal                       Personal Care Assistance Level of Assistance  Bathing, Feeding, Dressing Bathing Assistance: Maximum assistance Feeding assistance: Independent Dressing Assistance: Limited assistance     Functional Limitations Info  Sight, Hearing, Speech  Sight Info: Adequate Hearing Info: Adequate Speech Info: Adequate    SPECIAL CARE FACTORS FREQUENCY  PT (By licensed PT)     PT Frequency: 5x/week              Contractures       Additional Factors Info  Code Status, Allergies, Psychotropic, Isolation Precautions Code Status Info: Full Allergies Info: Latex, Tape Psychotropic Info: Wellbutrin; Zoloft   Isolation Precautions Info: MRSA     Current Medications (01/12/2017):  This is the current hospital active medication list Current Facility-Administered Medications  Medication Dose Route Frequency Provider Last Rate Last Dose  . acetaminophen (TYLENOL) tablet 650 mg  650 mg Oral Q6H PRN Jani Gravel, MD   650 mg at 01/11/17 2129   Or  . acetaminophen (TYLENOL) suppository 650 mg  650 mg Rectal Q6H PRN Jani Gravel, MD      . aspirin EC tablet 81 mg  81 mg Oral q morning - 10a Jani Gravel, MD   81 mg at 01/12/17 1023  . atorvastatin (LIPITOR) tablet 20 mg  20 mg Oral Daily Jani Gravel, MD   20 mg at 01/12/17 1023  . B-complex with vitamin C tablet 1 tablet  1 tablet Oral Daily Jani Gravel, MD   1 tablet at 01/12/17 1022  . buPROPion (WELLBUTRIN XL) 24 hr tablet 150 mg  150 mg Oral Zara Chess, MD   150 mg at 01/12/17 1025  . Chlorhexidine Gluconate Cloth 2 % PADS 6 each  6 each Topical Q0600 Jani Gravel, MD   6 each at 01/12/17 0544  . cholecalciferol (VITAMIN D) tablet 1,000 Units  1,000 Units Oral Daily Jani Gravel, MD   1,000 Units at 01/12/17 1026  . clonazePAM (KLONOPIN) tablet 0.25 mg  0.25 mg Oral QHS PRN Jani Gravel, MD   0.25 mg at 01/11/17 2128  . enoxaparin (LOVENOX) injection 40 mg  40 mg Subcutaneous Q24H Jani Gravel, MD   40 mg at 01/12/17 1018  . feeding supplement (PRO-STAT SUGAR FREE 64) liquid 30 mL  30 mL Oral BID Jani Gravel, MD   30 mL at 01/12/17 1021  . hydrALAZINE (APRESOLINE) injection 5 mg  5 mg Intravenous Q6H PRN Jani Gravel, MD   5 mg at 01/11/17 0536  . HYDROcodone-acetaminophen (NORCO/VICODIN) 5-325 MG per tablet 1 tablet  1 tablet Oral Q4H PRN Jani Gravel, MD   1 tablet at 01/12/17 1057  . ketoconazole (NIZORAL) 2 % cream 1 application  1 application Topical Daily PRN Jani Gravel, MD       . latanoprost (XALATAN) 0.005 % ophthalmic solution 1 drop  1 drop Both Eyes QHS Jani Gravel, MD   1 drop at 01/11/17 2131  . [START ON 01/16/2017] levothyroxine (SYNTHROID, LEVOTHROID) tablet 25 mcg  25 mcg Oral Once per day on Sun Kim, James, MD      . levothyroxine (SYNTHROID, LEVOTHROID) tablet 50 mcg  50 mcg Oral Once per day on Mon Wed Fri Kim, James, MD   50 mcg at 01/12/17 0543  . linaclotide (LINZESS) capsule 145 mcg  145 mcg Oral QAC breakfast Jani Gravel, MD   145 mcg at 01/12/17 [phone]  . mirtazapine (REMERON) tablet 30 mg  30 mg Oral QHS Jani Gravel, MD   30 mg at 01/11/17 2129  . mupirocin ointment (BACTROBAN) 2 % 1 application  1 application Nasal BID Jani Gravel, MD   1 application at 54/65/68 1057  . omega-3 acid ethyl esters (LOVAZA) capsule 1,000 mg  1,000 mg Oral q morning - 10a Jani Gravel, MD   1,000 mg at 01/12/17 1022  . ondansetron (ZOFRAN) injection 4 mg  4 mg Intravenous Q6H PRN Jani Gravel, MD      . polyethylene glycol (MIRALAX / GLYCOLAX) packet 17 g  17 g Oral Daily PRN Jani Gravel, MD      . sertraline (ZOLOFT) tablet 200 mg  200 mg Oral Daily Jani Gravel, MD   200 mg at 01/12/17 1024  . tamsulosin (FLOMAX) capsule 0.4 mg  0.4 mg Oral QPC supper Jani Gravel, MD   0.4 mg at 01/11/17 1757  . topiramate (TOPAMAX) tablet 25 mg  25 mg Oral Daily Jani Gravel, MD   25 mg at 01/12/17 1020  . topiramate (TOPAMAX) tablet 50 mg  50 mg Oral QHS Jani Gravel, MD   50 mg at 01/11/17 2129     Discharge Medications: Please see discharge summary for a list of discharge medications.  Relevant Imaging Results:  Relevant Lab Results:   Additional Information SSN: [place], [place]

## 2017-01-12 NOTE — [place] FL2 (Signed)
[place] MEDICAID FL2 LEVEL OF CARE SCREENING TOOL     IDENTIFICATION  Patient Name: Jose Leach Birthdate: 1932/05/02 Sex: male Admission Date (Current Location): 01/10/2017  [facility] and [place] Number:  Herbalist and Address:  [facility], [place] [address] [place], [place] [address]      Provider Number: [id number]  Attending Physician Name and Address:  Bonnielee Haff, MD  Relative Name and Phone Number:  [name], spouse, [phone]    Current Level of Care: Hospital Recommended Level of Care: [place] Prior Approval Number:    Date Approved/Denied:   PASRR Number: [id number]  Discharge Plan: SNF    Current Diagnoses: Patient Active Problem List   Diagnosis Date Noted  . Hip pain 01/10/2017  . Anemia 01/10/2017  . Major depression, recurrent, chronic ([place]) 11/17/2016  . CKD (chronic kidney disease) stage 3, GFR 30-59 ml/min (HCC) 11/17/2016  . History of fall 06/22/2016  . Hyperglycemia 06/22/2016  . Elevated TSH 06/22/2016  . CKD (chronic kidney disease) stage 2, GFR 60-89 ml/min 06/22/2016  . Edema 12/23/2015  . Callus 07/23/2014  . Fall 07/09/2014  . Depression 07/09/2014  . Pain in left hip 05/22/2014  . AMD (age-related macular degeneration), wet ([place]) 03/21/2014  . Cataract, nuclear 03/21/2014  . Glaucoma, compensated 03/21/2014  . Status post intraocular lens implant 03/21/2014  . Benign prostatic hyperplasia with urinary obstruction 12/27/2013  . Knee pain, right 09/25/2013  . Bilateral buttock pain 09/18/2013  . Other testicular hypofunction 09/14/2013  . Hip hematoma, right 09/09/2013  . Altered mental status 09/09/2013  . Benign essential tremor 04/18/2013  . 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 Bladder retention 02/05/2013  . Renal cyst, left 01/30/2013  . Femoral hernia, bilateral s/p lap repair 01/16/2013 01/16/2013  . Personal history of colonic polyps 01/05/2013  . Fecal  impaction ([place]) 10/11/2012  . Bilateral inguinal hernia 10/09/2012  . Urge incontinence 10/09/2012  . Bilateral leg edema 09/13/2012  . HTN (hypertension) 08/22/2012  . Unspecified vitamin D deficiency 08/06/2012  . Bradycardia, sinus 08/02/2012  . Constipation 07/24/2012  . Family history of colon cancer 07/24/2012  . Pseudoaphakia 12/16/2011  . Degeneration macular 11/02/2011  . Benign prostatic hyperplasia without lower urinary tract symptoms 10/11/2011  . Bladder neck obstruction 10/11/2011  . Hydrocele in adult 10/11/2011  . 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 Difficulty walking 04/05/2011  . Balance problems 04/05/2011  . Hammer toe 04/23/2010  . Unsteady gait 11/12/2009  . MORTON'S NEUROMA, LEFT 08/22/2007  . Anxiety state 06/07/2007  . ARTHRITIS 06/07/2007  . CAROTID ARTERY DISEASE 03/31/2007  . PVD (peripheral vascular disease) ([place]) 03/16/2007  . Hypothyroidism 11/10/2006  . HYPERLIPIDEMIA 11/10/2006  . MIGRAINE HEADACHE 03/14/2006  . PREMATURE ATRIAL CONTRACTIONS 03/14/2006  . Cervical spinal stenosis 03/14/2006  . ESOPHAGITIS 08/06/2002    Orientation RESPIRATION BLADDER Height & Weight     Self, Time, Situation, Place  Normal Continent, External catheter Weight: 145 lb 4.5 oz (65.9 kg) Height:  6' (182.9 cm)  BEHAVIORAL SYMPTOMS/MOOD NEUROLOGICAL BOWEL NUTRITION STATUS  (N/A)   Continent Diet(Please see DC Summary)  AMBULATORY STATUS COMMUNICATION OF NEEDS Skin   Limited Assist Verbally Normal                       Personal Care Assistance Level of Assistance  Bathing, Feeding, Dressing Bathing Assistance: Maximum assistance Feeding assistance: Independent Dressing Assistance: Limited assistance     Functional Limitations Info  Sight, Hearing, Speech  Sight Info: Adequate Hearing Info: Adequate Speech Info: Adequate    SPECIAL CARE FACTORS FREQUENCY  PT (By licensed PT), OT (By licensed OT)     PT Frequency: 5x/week OT Frequency: 5x/week             Contractures      Additional Factors Info  Code Status, Allergies, Psychotropic, Isolation Precautions Code Status Info: Full Allergies Info: Latex, Tape Psychotropic Info: Wellbutrin; Zoloft   Isolation Precautions Info: MRSA     Current Medications (01/12/2017):  This is the current hospital active medication list Current Facility-Administered Medications  Medication Dose Route Frequency Provider Last Rate Last Dose  . acetaminophen (TYLENOL) tablet 650 mg  650 mg Oral Q6H PRN Jani Gravel, MD   650 mg at 01/11/17 2129   Or  . acetaminophen (TYLENOL) suppository 650 mg  650 mg Rectal Q6H PRN Jani Gravel, MD      . aspirin EC tablet 81 mg  81 mg Oral q morning - 10a Jani Gravel, MD   81 mg at 01/12/17 1023  . atorvastatin (LIPITOR) tablet 20 mg  20 mg Oral Daily Jani Gravel, MD   20 mg at 01/12/17 1023  . B-complex with vitamin C tablet 1 tablet  1 tablet Oral Daily Jani Gravel, MD   1 tablet at 01/12/17 1022  . buPROPion (WELLBUTRIN XL) 24 hr tablet 150 mg  150 mg Oral Zara Chess, MD   150 mg at 01/12/17 1025  . Chlorhexidine Gluconate Cloth 2 % PADS 6 each  6 each Topical Q0600 Jani Gravel, MD   6 each at 01/12/17 0544  . cholecalciferol (VITAMIN D) tablet 1,000 Units  1,000 Units Oral Daily Jani Gravel, MD   1,000 Units at 01/12/17 1026  . clonazePAM (KLONOPIN) tablet 0.25 mg  0.25 mg Oral QHS PRN Jani Gravel, MD   0.25 mg at 01/11/17 2128  . enoxaparin (LOVENOX) injection 40 mg  40 mg Subcutaneous Q24H Jani Gravel, MD   40 mg at 01/12/17 1018  . feeding supplement (PRO-STAT SUGAR FREE 64) liquid 30 mL  30 mL Oral BID Jani Gravel, MD   30 mL at 01/12/17 1021  . hydrALAZINE (APRESOLINE) injection 5 mg  5 mg Intravenous Q6H PRN Jani Gravel, MD   5 mg at 01/11/17 0536  . HYDROcodone-acetaminophen (NORCO/VICODIN) 5-325 MG per tablet 1 tablet  1 tablet Oral Q4H PRN Jani Gravel, MD   1 tablet at 01/12/17 1057  . ketoconazole (NIZORAL) 2 % cream 1 application  1 application Topical  Daily PRN Jani Gravel, MD      . latanoprost (XALATAN) 0.005 % ophthalmic solution 1 drop  1 drop Both Eyes QHS Jani Gravel, MD   1 drop at 01/11/17 2131  . [START ON 01/16/2017] levothyroxine (SYNTHROID, LEVOTHROID) tablet 25 mcg  25 mcg Oral Once per day on Sun Kim, James, MD      . levothyroxine (SYNTHROID, LEVOTHROID) tablet 50 mcg  50 mcg Oral Once per day on Mon Wed Fri Kim, James, MD   50 mcg at 01/12/17 0543  . linaclotide (LINZESS) capsule 145 mcg  145 mcg Oral QAC breakfast Jani Gravel, MD   145 mcg at 01/12/17 [phone]  . mirtazapine (REMERON) tablet 30 mg  30 mg Oral QHS Jani Gravel, MD   30 mg at 01/11/17 2129  . mupirocin ointment (BACTROBAN) 2 % 1 application  1 application Nasal BID Jani Gravel, MD   1 application at 35/00/93 1057  . omega-3 acid ethyl esters (  LOVAZA) capsule 1,000 mg  1,000 mg Oral q morning - 10a Jani Gravel, MD   1,000 mg at 01/12/17 1022  . ondansetron (ZOFRAN) injection 4 mg  4 mg Intravenous Q6H PRN Jani Gravel, MD      . polyethylene glycol (MIRALAX / GLYCOLAX) packet 17 g  17 g Oral Daily PRN Jani Gravel, MD      . sertraline (ZOLOFT) tablet 200 mg  200 mg Oral Daily Jani Gravel, MD   200 mg at 01/12/17 1024  . tamsulosin (FLOMAX) capsule 0.4 mg  0.4 mg Oral QPC supper Jani Gravel, MD   0.4 mg at 01/11/17 1757  . topiramate (TOPAMAX) tablet 25 mg  25 mg Oral Daily Jani Gravel, MD   25 mg at 01/12/17 1020  . topiramate (TOPAMAX) tablet 50 mg  50 mg Oral QHS Jani Gravel, MD   50 mg at 01/11/17 2129     Discharge Medications: Please see discharge summary for a list of discharge medications.  Relevant Imaging Results:  Relevant Lab Results:   Additional Information SSN: [place], LCSW

## 2017-01-12 NOTE — Care Management Obs Status (Signed)
[place] NOTIFICATION   Patient Details  Name: Jose Leach MRN: [id number] Date of Birth: 1932/04/23   Medicare Observation Status Notification Given:  Yes    Marilu Favre, RN 01/12/2017, 3:14 PM

## 2017-01-12 NOTE — Consult Note (Signed)
Chief Complaint   Chief Complaint  Patient presents with  . Fall    HPI   HPI: CON ARGANBRIGHT is a 83 y.o. male Who presented to the emergency room on 11/26 due to persistent left hip pain.  On 11/24, he fell and had a normal x-rays left hip so discharged home. He returned to the emergency room 2 days later and underwent a CT scan  Of his pelvis due to persistent pain which showed  A nondisplaced fracture of the left acetabulum/ ilium.  He was admitted for pain control.      Of note, patient resides in independent living at [facility] [place]. Reportedly, patient was completely bedridden after the initial fall   Which prompted him to return to the emergency room 2 days later.  The physician at [facility] was concern for possible stroke versus spinal cord compression due to history of cervical spinal stenosis studies followed by Neurology for.  He underwent a cervical MRI which showed stable chronic changes.  Neurosurgery was asked to consult due to the cervical stenosis.  Patient reports over the last 10+ years multiple recurrent falls.  No true injury with the exception of this most recent fall suffering a left hip fracture.   He has had worsening gait instability over the last several months.  He has neuropathy so he has numbness and tingling in his bilateral upper and bilateral lower extremities - managed by Neurology.   Endorses acute LLE weakness due to hip pain as well as midline neck pain that he attributes to arthritis.  Denies bowel or bladder dysfunction.  Patient Active Problem List   Diagnosis Date Noted  . Hip pain 01/10/2017  . Anemia 01/10/2017  . Major depression, recurrent, chronic ([place]) 11/17/2016  . CKD (chronic kidney disease) stage 3, GFR 30-59 ml/min (HCC) 11/17/2016  . History of fall 06/22/2016  . Hyperglycemia 06/22/2016  . Elevated TSH 06/22/2016  . CKD (chronic kidney disease) stage 2, GFR 60-89 ml/min 06/22/2016  . Edema  12/23/2015  . Callus 07/23/2014  . Fall 07/09/2014  . Depression 07/09/2014  . Pain in left hip 05/22/2014  . AMD (age-related macular degeneration), wet ([place]) 03/21/2014  . Cataract, nuclear 03/21/2014  . Glaucoma, compensated 03/21/2014  . Status post intraocular lens implant 03/21/2014  . Benign prostatic hyperplasia with urinary obstruction 12/27/2013  . Knee pain, right 09/25/2013  . Bilateral buttock pain 09/18/2013  . Other testicular hypofunction 09/14/2013  . Hip hematoma, right 09/09/2013  . Altered mental status 09/09/2013  . Benign essential tremor 04/18/2013  . 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 Bladder retention 02/05/2013  . Renal cyst, left 01/30/2013  . Femoral hernia, bilateral s/p lap repair 01/16/2013 01/16/2013  . Personal history of colonic polyps 01/05/2013  . Fecal impaction ([place]) 10/11/2012  . Bilateral inguinal hernia 10/09/2012  . Urge incontinence 10/09/2012  . Bilateral leg edema 09/13/2012  . HTN (hypertension) 08/22/2012  . Unspecified vitamin D deficiency 08/06/2012  . Bradycardia, sinus 08/02/2012  . Constipation 07/24/2012  . Family history of colon cancer 07/24/2012  . Pseudoaphakia 12/16/2011  . Degeneration macular 11/02/2011  . Benign prostatic hyperplasia without lower urinary tract symptoms 10/11/2011  . Bladder neck obstruction 10/11/2011  . Hydrocele in adult 10/11/2011  . 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 Difficulty walking 04/05/2011  . Balance problems 04/05/2011  . Hammer toe 04/23/2010  . Unsteady gait 11/12/2009  . MORTON'S NEUROMA, LEFT 08/22/2007  . Anxiety state 06/07/2007  . ARTHRITIS 06/07/2007  .  CAROTID ARTERY DISEASE 03/31/2007  . PVD (peripheral vascular disease) ([place]) 03/16/2007  . Hypothyroidism 11/10/2006  . HYPERLIPIDEMIA 11/10/2006  . MIGRAINE HEADACHE 03/14/2006  . PREMATURE ATRIAL CONTRACTIONS 03/14/2006  . Cervical spinal stenosis 03/14/2006  . ESOPHAGITIS 08/06/2002    PMH: Past Medical  History:  Diagnosis Date  . Acute urinary retention 01/28/2013  . AKI (acute kidney injury) ([place]) 01/26/2013  . Altered mental status 09/09/2013  . Anemia    B12 deficiency  . ANXIETY 06/07/2007   Qualifier: Diagnosis of  By: Talbert Cage CMA ([place]), June    . ARTHRITIS 06/07/2007   Qualifier: Diagnosis of  By: Talbert Cage CMA ([place]), June    . Balance problems 04/05/2011  . Benign essential tremor   . Benign fibroma of prostate 10/11/2011  . Bilateral inguinal hernia 10/09/2012  . Bilateral leg edema 09/13/2012  . Bladder retention 02/05/2013  . Bradycardia, sinus 08/02/2012  . CAROTID ARTERY DISEASE 03/31/2007   Qualifier: Diagnosis of  By: Linna Darner MD, Gwyndolyn Saxon    . Carotid stenosis    s/p L CEA  . Cataract, nuclear 03/21/2014  . 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 Cervical spinal stenosis   . Cervical stenosis of spine   . Critical lower limb ischemia   . Degeneration macular 11/02/2011  . Depression    Dr Albertine Patricia  . Difficulty in walking(719.7) 04/05/2011  . ESOPHAGITIS 08/06/2002   Qualifier: Diagnosis of  By: Talbert Cage CMA ([place]), June    . Essential and other specified forms of tremor 04/18/2013  . Fall at home 01/06/2017   left hip pain  . Falls   . Family history of colon cancer 07/24/2012  . Fecal impaction ([place]) 10/11/2012  . Femoral hernia, bilateral s/p lap repair 01/16/2013 01/16/2013  . Glaucoma, compensated 03/21/2014  . H/O cardiovascular stress test    a. 02/2003 -> no ischemia/infarct.  Marland Kitchen HAMMER TOE 04/23/2010   Qualifier: Diagnosis of  By: Linna Darner MD, Gwyndolyn Saxon    . Hematoma of leg   . Hip hematoma, right 09/09/2013  . History of shingles 2009  . HTN (hypertension) 08/22/2012  . Hx of echocardiogram    Echocardiogram 08/01/12: Mild LVH, EF 55-60%, normal wall motion.  . Hydrocele 10/11/2011  . Hyperlipidemia   . 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 Hypothyroidism   . Macular degeneration disease   . Macular edema   . Migraines    Dr Jannifer Franklin  . MORTON'S NEUROMA, LEFT 08/22/2007    Qualifier: Diagnosis of  By: Linna Darner MD, Gwyndolyn Saxon    . PAD (peripheral artery disease) ([place])   . Peripheral neuropathy   . Personal history of colonic polyps 01/05/2013   2014 2 mm adenomatous polyp   . PREMATURE ATRIAL CONTRACTIONS 03/14/2006   Qualifier: Diagnosis of  By: Linna Darner MD, Gwendolyn Lima 12/16/2011  . SPINAL STENOSIS 03/14/2006   Qualifier: Diagnosis of  By: Linna Darner MD, William   Cervical spine    . Testosterone deficiency    Dr Lawerance Bach, [facility]  . Unspecified vitamin D deficiency 08/06/2012  . Urge incontinence 10/09/2012  . Urinary tract infection, site not specified 02/13/2013  . Weakness 08/02/2012    PSH: Past Surgical History:  Procedure Laterality Date  . APPENDECTOMY  1941  . BREAST CYST EXCISION Left 01/16/2013   Procedure: MASS EXCISION AXILLA;  Surgeon: Adin Hector, MD;  Location: [place];  Service: General;  Laterality: Left;  . CAROTID ENDARTERECTOMY  2005    L  . CATARACT EXTRACTION Right   . COLONOSCOPY  2014   Dr. Deatra Ina  . EYE SURGERY Right 02/2012   retina  . INGUINAL HERNIA REPAIR Bilateral 01/16/2013   Procedure: LAPAROSCOPIC BILATERAL INGUINAL DIRECT AND INDIRECT, FEMORAL, AND OBTURATOR HERNIAS;  Surgeon: Adin Hector, MD;  Location: [place];  Service: General;  Laterality: Bilateral;  . INSERTION OF MESH N/A 01/16/2013   Procedure: INSERTION OF MESH;  Surgeon: Adin Hector, MD;  Location: [place];  Service: General;  Laterality: N/A;  . LUMBAR LAMINECTOMY    . TONSILLECTOMY AND ADENOIDECTOMY    . TOTAL HIP ARTHROPLASTY  2005   L    Medications Prior to Admission  Medication Sig Dispense Refill Last Dose  . aspirin 81 MG tablet Take 81 mg by mouth every morning.    01/09/2017 at Unknown time  . atorvastatin (LIPITOR) 20 MG tablet Take one tablet by mouth once daily for cholesterol (Patient taking differently: Take 20 mg by mouth daily. ) 90 tablet 3 01/09/2017 at Unknown time  . B Complex-C (B-COMPLEX WITH VITAMIN C) tablet Take 1  tablet by mouth daily.   01/09/2017 at Unknown time  . bimatoprost (LUMIGAN) 0.01 % SOLN Place 1 drop into both eyes at bedtime.   01/09/2017 at Unknown time  . buPROPion (WELLBUTRIN XL) 150 MG 24 hr tablet Take 150 mg by mouth every morning.   01/09/2017 at Unknown time  . Cholecalciferol (VITAMIN D3) 1000 units CAPS Take 1,000 Units by mouth daily.   01/09/2017 at Unknown time  . fluticasone (CUTIVATE) 0.05 % cream Apply 1 application topically 2 (two) times daily as needed (irritation).    prn  . ketoconazole (NIZORAL) 2 % cream Apply 1 application topically daily as needed for irritation.    prn  . Krill Oil 500 MG CAPS Take 500 mg by mouth every morning.    01/09/2017 at Unknown time  . levothyroxine (SYNTHROID) 50 MCG tablet Take 1/2 tablet by mouth on Sunday mornings and Take one tablet by mouth every other morning for thryoid. (Patient taking differently: Take 25-50 mcg by mouth See admin instructions. 22mg on Sunday. Then 534m Mon/Wed/Fri) 30 tablet 0 01/09/2017 at Unknown time  . linaclotide (LINZESS) 145 MCG CAPS capsule Take 1 capsule (145 mcg total) by mouth daily before breakfast. 30 capsule 3 01/09/2017 at Unknown time  . Multiple Vitamins-Minerals (ICAPS PO) Take 1 tablet by mouth daily.   01/09/2017 at Unknown time  . polyethylene glycol (MIRALAX / GLYCOLAX) packet Take 17 g by mouth daily as needed for mild constipation.   prn  . sertraline (ZOLOFT) 100 MG tablet Take 200 mg by mouth every morning.    01/09/2017 at Unknown time  . tamsulosin (FLOMAX) 0.4 MG CAPS capsule TAKE 1 CAPSULE (0.4 MG TOTAL) BY MOUTH DAILY.   01/09/2017 at Unknown time  . topiramate (TOPAMAX) 25 MG tablet TAKE ONE TABLET BY MOUTH EVERY MORNING AND 2 TABLETS IN THE EVENING (Patient taking differently: Take 25-50 mg by mouth See admin instructions. '25mg'$  in the morning and '50mg'$  in the evening) 90 tablet 11 01/09/2017 at Unknown time    SH: Social History   Tobacco Use  . Smoking status: Former Smoker     Years: 10.00    Types: Pipe, Cigars    Last attempt to quit: 02/16/1979    Years since quitting: 37.9  . Smokeless tobacco: Never Used  . Tobacco comment: Quit about age 83 Substance Use Topics  . Alcohol use: No    Alcohol/week: 2.4 oz  Types: 4 Standard drinks or equivalent per week  . Drug use: No    MEDS: Prior to Admission medications   Medication Sig Start Date End Date Taking? Authorizing Provider  aspirin 81 MG tablet Take 81 mg by mouth every morning.    Yes [provider]  atorvastatin (LIPITOR) 20 MG tablet Take one tablet by mouth once daily for cholesterol Patient taking differently: Take 20 mg by mouth daily.  03/02/16  Yes Estill Dooms, MD  B Complex-C (B-COMPLEX WITH VITAMIN C) tablet Take 1 tablet by mouth daily.   Yes [provider]  bimatoprost (LUMIGAN) 0.01 % SOLN Place 1 drop into both eyes at bedtime.   Yes [provider]  buPROPion (WELLBUTRIN XL) 150 MG 24 hr tablet Take 150 mg by mouth every morning. 05/22/16  Yes [provider]  Cholecalciferol (VITAMIN D3) 1000 units CAPS Take 1,000 Units by mouth daily.   Yes [provider]  fluticasone (CUTIVATE) 0.05 % cream Apply 1 application topically 2 (two) times daily as needed (irritation).  05/15/16  Yes [provider]  ketoconazole (NIZORAL) 2 % cream Apply 1 application topically daily as needed for irritation.  05/15/16  Yes [provider]  Javier Docker Oil 500 MG CAPS Take 500 mg by mouth every morning.    Yes [provider]  levothyroxine (SYNTHROID) 50 MCG tablet Take 1/2 tablet by mouth on Sunday mornings and Take one tablet by mouth every other morning for thryoid. Patient taking differently: Take 25-50 mcg by mouth See admin instructions. 71mg on Sunday. Then 544m Mon/Wed/Fri 05/16/15  Yes GrEstill DoomsMD  linaclotide ([facility]145 MCG CAPS capsule Take 1 capsule (145 mcg total) by mouth daily before breakfast. 09/01/16  Yes PaBlanchie ServeMD  Multiple Vitamins-Minerals (ICAPS PO) Take 1 tablet by mouth daily.   Yes [provider]  polyethylene glycol (MIRALAX / GLYCOLAX) packet Take 17 g by mouth daily as needed for mild constipation.   Yes [provider]  sertraline (ZOLOFT) 100 MG tablet Take 200 mg by mouth every morning.    Yes [provider]  tamsulosin (FLOMAX) 0.4 MG CAPS capsule TAKE 1 CAPSULE (0.4 MG TOTAL) BY MOUTH DAILY. 05/24/14  Yes [provider]  topiramate (TOPAMAX) 25 MG tablet TAKE ONE TABLET BY MOUTH EVERY MORNING AND 2 TABLETS IN THE EVENING Patient taking differently: Take 25-50 mg by mouth See admin instructions. '25mg'$  in the morning and '50mg'$  in the evening 01/01/16  Yes MaDennie BibleNP  acetaminophen (TYLENOL) 325 MG tablet Take 2 tablets (650 mg total) by mouth every 6 (six) hours as needed for mild pain (or Fever >/= 101). 01/12/17   KrBonnielee HaffMD  clonazePAM (KLONOPIN) 0.5 MG tablet Take 0.5 tablets (0.25 mg total) by mouth at bedtime as needed for anxiety (sleep). Take one daily for anxiety 01/12/17   KrBonnielee HaffMD  HYDROcodone-acetaminophen (NORCO/VICODIN) 5-325 MG tablet Take 1 tablet by mouth every 4 (four) hours as needed for moderate pain. 01/12/17   KrBonnielee HaffMD  mirtazapine (REMERON) 30 MG tablet Take 1 tablet (30 mg total) by mouth at bedtime. 01/12/17   KrBonnielee HaffMD    ALLERGY: Allergies  Allergen Reactions  . Latex Rash  . Tape Rash    Paper tape is ok to use     Social History   Tobacco Use  . Smoking status: Former Smoker    Years: 10.00    Types: Pipe, CiLandscape architect  Last attempt to quit: 02/16/1979    Years since quitting: 37.9  . Smokeless tobacco: Never Used  . Tobacco comment: Quit about age 51   Substance Use Topics  . Alcohol use: No    Alcohol/week: 2.4 oz    Types: 4 Standard drinks or equivalent per week     Family History  Problem Relation Age of Onset  . Stroke Mother 87  . Hypertension  Mother   . Diabetes Mother   . Heart disease Mother   . Rectal cancer Father 65  . Heart disease Father        in 62s  . Cancer Father        colorectal  . Nephritis Sister   . Seizures Brother        died as child     ROS   Review of Systems  Constitutional: Positive for malaise/fatigue.  HENT: Negative.   Cardiovascular: Negative.   Genitourinary: Negative.   Musculoskeletal: Positive for back pain, falls, joint pain, myalgias and neck pain.  Skin: Negative.   Neurological: Positive for tingling, tremors, sensory change and weakness. Negative for speech change, focal weakness, seizures, loss of consciousness and headaches.    Exam   Vitals:   01/11/17 2032 01/12/17 0600  BP: (!) 176/96 (!) 174/66  Pulse: 82 (!) 55  Resp: 18 18  Temp: 98.8 F (37.1 C) 97.8 F (36.6 C)  SpO2: 97% 98%   General appearance: Elderly male, NAD, resting tremor Eyes: PERRL Cardiovascular: Regular rate and rhythm without murmurs, rubs, gallops. No edema or variciosities. Distal pulses normal. Pulmonary: Clear to auscultation Musculoskeletal:     Muscle tone upper extremities: Normal    Muscle tone lower extremities: Normal    Motor exam: Upper Extremities Deltoid Bicep Tricep Grip  Right 5/5 5/5 5/5 5/5  Left 5/5 5/5 5/5 5/5  RLE 5/5 LLE 5/5 with exception of hip - limited exam due to fracture  Neurological Awake, alert, oriented Memory and concentration grossly intact Speech fluent, appropriate CNII: Visual fields normal CNIII/IV/VI: EOMI CNV: Facial sensation normal CNVII: Symmetric, normal strength CNVIII: Grossly normal CNIX: Normal palate movement CNXI: Trap and SCM strength normal CN XII: Tongue protrusion normal Sensation grossly intact to LT  Results - Imaging/Labs   Results for orders placed or performed during the hospital encounter of 01/10/17 (from the past 48 hour(s))  CBC with Differential/Platelet     Status: Abnormal   Collection Time: 01/10/17  3:28 PM   Result Value Ref Range   WBC 6.9 4.0 - 10.5 K/uL   RBC 4.04 (L) 4.22 - 5.81 MIL/uL   Hemoglobin 12.4 (L) 13.0 - 17.0 g/dL   HCT 39.3 39.0 - 52.0 %   MCV 97.3 78.0 - 100.0 fL   MCH 30.7 26.0 - 34.0 pg   MCHC 31.6 30.0 - 36.0 g/dL   RDW 14.4 11.5 - 15.5 %   Platelets 152 150 - 400 K/uL   Neutrophils Relative % 76 %   Neutro Abs 5.3 1.7 - 7.7 K/uL   Lymphocytes Relative 15 %   Lymphs Abs 1.0 0.7 - 4.0 K/uL   Monocytes Relative 8 %   Monocytes Absolute 0.5 0.1 - 1.0 K/uL   Eosinophils Relative 1 %   Eosinophils Absolute 0.1 0.0 - 0.7 K/uL   Basophils Relative 0 %   Basophils Absolute 0.0 0.0 - 0.1 K/uL  Comprehensive metabolic panel     Status: Abnormal   Collection Time: 01/10/17  3:28 PM  Result  Value Ref Range   Sodium 140 135 - 145 mmol/L   Potassium 4.8 3.5 - 5.1 mmol/L   Chloride 112 (H) 101 - 111 mmol/L   CO2 21 (L) 22 - 32 mmol/L   Glucose, Bld 87 65 - 99 mg/dL   BUN 30 (H) 6 - 20 mg/dL   Creatinine, Ser 1.13 0.61 - 1.24 mg/dL   Calcium 8.8 (L) 8.9 - 10.3 mg/dL   Total Protein 6.2 (L) 6.5 - 8.1 g/dL   Albumin 3.2 (L) 3.5 - 5.0 g/dL   AST 18 15 - 41 U/L   ALT 17 17 - 63 U/L   Alkaline Phosphatase 38 38 - 126 U/L   Total Bilirubin 0.7 0.3 - 1.2 mg/dL   GFR calc non Af Amer 59 (L) >60 mL/min   GFR calc Af Amer >60 >60 mL/min    Comment: (NOTE) The eGFR has been calculated using the CKD EPI equation. This calculation has not been validated in all clinical situations. eGFR's persistently <60 mL/min signify possible Chronic Kidney Disease.    Anion gap 7 5 - 15  Urinalysis, Routine w reflex microscopic     Status: Leach   Collection Time: 01/10/17  4:00 PM  Result Value Ref Range   Color, Urine YELLOW YELLOW   APPearance CLEAR CLEAR   Specific Gravity, Urine 1.026 1.005 - 1.030   pH 6.0 5.0 - 8.0   Glucose, UA NEGATIVE NEGATIVE mg/dL   Hgb urine dipstick NEGATIVE NEGATIVE   Bilirubin Urine NEGATIVE NEGATIVE   Ketones, ur NEGATIVE NEGATIVE mg/dL   Protein, ur  NEGATIVE NEGATIVE mg/dL   Nitrite NEGATIVE NEGATIVE   Leukocytes, UA NEGATIVE NEGATIVE  MRSA PCR Screening     Status: Abnormal   Collection Time: 01/11/17  1:23 AM  Result Value Ref Range   MRSA by PCR POSITIVE (A) NEGATIVE    Comment:        The GeneXpert MRSA Assay (FDA approved for NASAL specimens only), is one component of a comprehensive MRSA colonization surveillance program. It is not intended to diagnose MRSA infection nor to guide or monitor treatment for MRSA infections. RESULT CALLED TO, READ BACK BY AND VERIFIED WITH: V BROWN RN [id number] 01/11/17 A BROWNING   Comprehensive metabolic panel     Status: Abnormal   Collection Time: 01/11/17  5:32 AM  Result Value Ref Range   Sodium 141 135 - 145 mmol/L   Potassium 4.1 3.5 - 5.1 mmol/L   Chloride 111 101 - 111 mmol/L   CO2 25 22 - 32 mmol/L   Glucose, Bld 92 65 - 99 mg/dL   BUN 28 (H) 6 - 20 mg/dL   Creatinine, Ser 1.07 0.61 - 1.24 mg/dL   Calcium 8.3 (L) 8.9 - 10.3 mg/dL   Total Protein 5.3 (L) 6.5 - 8.1 g/dL   Albumin 2.8 (L) 3.5 - 5.0 g/dL   AST 16 15 - 41 U/L   ALT 15 (L) 17 - 63 U/L   Alkaline Phosphatase 38 38 - 126 U/L   Total Bilirubin 0.7 0.3 - 1.2 mg/dL   GFR calc non Af Amer >60 >60 mL/min   GFR calc Af Amer >60 >60 mL/min    Comment: (NOTE) The eGFR has been calculated using the CKD EPI equation. This calculation has not been validated in all clinical situations. eGFR's persistently <60 mL/min signify possible Chronic Kidney Disease.    Anion gap 5 5 - 15  CBC     Status: Abnormal  Collection Time: 01/11/17  5:32 AM  Result Value Ref Range   WBC 6.3 4.0 - 10.5 K/uL   RBC 3.96 (L) 4.22 - 5.81 MIL/uL   Hemoglobin 11.9 (L) 13.0 - 17.0 g/dL   HCT 38.1 (L) 39.0 - 52.0 %   MCV 96.2 78.0 - 100.0 fL   MCH 30.1 26.0 - 34.0 pg   MCHC 31.2 30.0 - 36.0 g/dL   RDW 14.2 11.5 - 15.5 %   Platelets 143 (L) 150 - 400 K/uL  TSH     Status: Abnormal   Collection Time: 01/11/17  5:32 AM  Result Value Ref Range    TSH 4.866 (H) 0.350 - 4.500 uIU/mL    Comment: Performed by a 3rd Generation assay with a functional sensitivity of <=0.01 uIU/mL.    Dg Chest 2 View  Result Date: 01/11/2017 CLINICAL DATA:  Weakness for 1 day EXAM: CHEST  2 VIEW COMPARISON:  06/08/2016 FINDINGS: The heart size and mediastinal contours are within normal limits. Both lungs are clear. The visualized skeletal structures are unremarkable. : Inter positioning beneath the right diaphragm is felt to contribute to lucency beneath the right diaphragm. IMPRESSION: No active cardiopulmonary disease. Electronically Signed   By: Donavan Foil M.D.   On: 01/11/2017 00:29   Ct Head Wo Contrast  Result Date: 01/10/2017 CLINICAL DATA:  Fall on Thursday.  Weakness. EXAM: CT HEAD WITHOUT CONTRAST TECHNIQUE: Contiguous axial images were obtained from the base of the skull through the vertex without intravenous contrast. COMPARISON:  05/13/2016 FINDINGS: Brain: There is atrophy and chronic small vessel disease changes. No acute intracranial abnormality. Specifically, no hemorrhage, hydrocephalus, mass lesion, acute infarction, or significant intracranial injury. Vascular: No hyperdense vessel or unexpected calcification. Skull: No acute calvarial abnormality. Sinuses/Orbits: Visualized paranasal sinuses and mastoids clear. Orbital soft tissues unremarkable. Other: Leach IMPRESSION: No acute intracranial abnormality. Atrophy, chronic microvascular disease. Electronically Signed   By: Rolm Baptise M.D.   On: 01/10/2017 16:55   Ct Pelvis Wo Contrast  Result Date: 01/10/2017 CLINICAL DATA:  83 year old male fell yesterday onto left hip. Left hip pain. Surgery 15 years ago. Subsequent encounter. EXAM: CT PELVIS WITHOUT CONTRAST TECHNIQUE: Multidetector CT imaging of the pelvis was performed following the standard protocol without intravenous contrast. COMPARISON:  01/08/2017 plain film exam. FINDINGS: Urinary Tract: Evaluation of bladder limited by lack of  contrast and streak artifact. Bowel: Evaluation of bowel limited by lack of contrast and streak artifact Vascular/Lymphatic: Calcified aorta and iliac arteries. No adenopathy. Reproductive:  Evaluation prostate gland limited by streak artifact. Other:  Hydroceles. Musculoskeletal: Post left hip replacement. Nondisplaced fracture left acetabulum/ilium medial to the left acetabular screws. Marked right hip joint degenerative changes. Prominent degenerative changes lower lumbar spine. IMPRESSION: Post left hip replacement. Nondisplaced fracture left acetabulum/ilium medial to the left acetabular screws (series 4, images 29 through 33). Marked right hip joint degenerative changes. Prominent degenerative changes lower lumbar spine. Electronically Signed   By: Genia Del M.D.   On: 01/10/2017 17:00   Mr Cervical Spine Wo Contrast  Result Date: 01/10/2017 CLINICAL DATA:  83 y/o  M; status post fall and incontinence. EXAM: MRI CERVICAL SPINE WITHOUT CONTRAST TECHNIQUE: Multiplanar, multisequence MR imaging of the cervical spine was performed. No intravenous contrast was administered. COMPARISON:  11/16/2016 MRI of the cervical spine. 11/10/2011 CT of the cervical spine. FINDINGS: Alignment: Stable straightening of cervical lordosis. Stable grade 1 C7-T1 anterolisthesis. Vertebrae: No fracture, evidence of discitis, or bone lesion. Vertebral body heights are preserved. No  soft tissue edema or prevertebral fluid collection to suggest ligamentous injury. 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 No new abnormal cord signal. No evidence for cord hemorrhage. Posterior Fossa, vertebral arteries, paraspinal tissues: Negative. Disc levels:  Congenital cervical spinal canal stenosis. C2-3: Stable disc osteophyte complex eccentric to the left and right greater than left uncovertebral and facet hypertrophy. Severe right and mild left  foraminal stenosis. Moderate canal stenosis with left anterior cord impingement and flattening. C3-4: Stable disc osteophyte complex and extensive uncovertebral and facet hypertrophy. Severe bilateral foraminal stenosis. Moderate canal stenosis with cord impingement and flattening. C4-5: Stable disc osteophyte complex, central protrusion, and left-greater-than-right uncovertebral/ facet hypertrophy. Moderate right and severe left foraminal stenosis. Severe canal stenosis with cord flattening. C5-6: Stable disc osteophyte complex with bilateral uncovertebral and facet hypertrophy. Moderate to severe bilateral foraminal stenosis. Severe canal stenosis with cord flattening. C6-7: Stable disc osteophyte complex with bilateral uncovertebral and facet hypertrophy greater on the left. Moderate right and severe left foraminal stenosis. Moderate canal stenosis with anterior cord impingement and flattening. C7-T1: Stable grade 1 anterolisthesis with uncovered disc bulge and mild bilateral uncovertebral and facet hypertrophy. Mild bilateral foraminal and canal stenosis. IMPRESSION: 1. No acute osseous abnormality or findings of ligamentous injury. 2. Stable right lateral C4-5 cord signal likely representing chronic compressive myelopathy. 3. 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 4. Stable severe C4-5 and C5-6 canal stenosis. Moderate C2-3, C3-4, C6-7 canal stenosis. 5. Stable multiple levels of high-grade foraminal stenosis as above. Electronically Signed   By: Kristine Garbe M.D.   On: 01/10/2017 21:47    Impression/Plan   83 y.o. male   With stable severe multi-level cervical spinal stenosis & stable cord signal changes at C4-5 likely representing chronic compressive myelopathy when compared to his MRI from October.   While this likely needs to be intervene surgically, due to his hip fracture, it does not need to be addressed acutely.  I have discussed this  with the family at length who agrees that they would prefer to get the hip manage prior to any neurosurgical intervention.  He will follow-up as an outpatient when discharged.

## 2017-01-12 NOTE — Progress Notes (Signed)
Physical Therapy Treatment Patient Details Name: Jose Leach MRN: [id number] DOB: 17-Jun-1932 Today's Date: 01/12/2017    History of Present Illness 83 y.o. male admitted with fall and L hip pain. PMH of L THA,  hypertension, hyperlipidemia, hypothyroidism, PAD,  cervical spinal stenosis. Dx of L acetabular fx, conservative treatment, TDWB. Pt admitted from [place] [facility].     PT Comments    Continuing work on functional mobility and activity tolerance;  Still requiring +2 assist for safe transfers, but noted improving bed mobility compared to last session; pt and family hope to get him to rehab at [facility] as soon as possible.   Follow Up Recommendations  SNF     Equipment Recommendations  Wheelchair (measurements PT);Wheelchair cushion (measurements PT)    Recommendations for Other Services       Precautions / Restrictions Precautions Precautions: Fall Restrictions LLE Weight Bearing: Touchdown weight bearing    Mobility  Bed Mobility Overal bed mobility: Needs Assistance Bed Mobility: Supine to Sit     Supine to sit: Mod assist     General bed mobility comments: Cues for technique; noted good effort to push up on elbows and then use bed rails to sit up; mod assist for LLE and to help with sitting fully upright  Transfers Overall transfer level: Needs assistance Equipment used: 2 person hand held assist Transfers: Stand Pivot Transfers Sit to Stand: Max assist;+2 physical assistance;From elevated surface Stand pivot transfers: Max assist;+2 physical assistance;From elevated surface       General transfer comment: +2 assist to pivot to recliner placed on pt's R side; blocked R knee for stability and monitored LLE for TWB  Ambulation/Gait                 Stairs            Wheelchair Mobility    Modified Rankin (Stroke Patients Only)       Balance     Sitting balance-Leahy Scale: Fair       Standing balance-Leahy  Scale: Zero                              Cognition Arousal/Alertness: Awake/alert Behavior During Therapy: WFL for tasks assessed/performed Overall Cognitive Status: Within Functional Limits for tasks assessed(For simple mobility; noted bouts with confusion in nurses' notes)                                        Exercises      General Comments        Pertinent Vitals/Pain Pain Assessment: Faces Faces Pain Scale: Hurts even more Pain Location: L hip with movement Pain Descriptors / Indicators: Sore;Guarding;Grimacing Pain Intervention(s): Limited activity within patient's tolerance    Home Living                      Prior Function            PT Goals (current goals can now be found in the care plan section) Acute Rehab PT Goals Patient Stated Goal: [facility] PT Goal Formulation: With patient Time For Goal Achievement: 01/25/17 Potential to Achieve Goals: Fair Progress towards PT goals: Progressing toward goals    Frequency    Min 3X/week      PT Plan Current plan remains appropriate  Co-evaluation              AM-PAC PT "6 Clicks" Daily Activity  Outcome Measure  Difficulty turning over in bed (including adjusting bedclothes, sheets and blankets)?: Unable Difficulty moving from lying on back to sitting on the side of the bed? : Unable Difficulty sitting down on and standing up from a chair with arms (e.g., wheelchair, bedside commode, etc,.)?: Unable Help needed moving to and from a bed to chair (including a wheelchair)?: Total Help needed walking in hospital room?: Total Help needed climbing 3-5 steps with a railing? : Total 6 Click Score: 6    End of Session Equipment Utilized During Treatment: Gait belt Activity Tolerance: Patient limited by pain Patient left: in chair;with chair alarm set;with call bell/phone within reach Nurse Communication: Mobility status PT Visit Diagnosis:  History of falling (Z91.81);Difficulty in walking, not elsewhere classified (R26.2);Pain Pain - Right/Left: Left Pain - part of body: Hip     Time: 4462-8638 PT Time Calculation (min) (ACUTE ONLY): 18 min  Charges:  $Therapeutic Activity: 8-22 mins                    G Codes:       Roney Marion, PT  Acute Rehabilitation Services Pager [phone] Office [phone]    Jose Leach 01/12/2017, 12:39 PM

## 2017-01-12 NOTE — Clinical Social Work Note (Signed)
Clinical Social Work Assessment  Patient Details  Name: Jose Leach MRN: [id number] Date of Birth: Jan 26, 1933  Date of referral:  01/12/17               Reason for consult:  Facility Placement                Permission sought to share information with:  Facility Sport and exercise psychologist, Family Supports Permission granted to share information::  Yes, Verbal Permission Granted  Name::     [name]  Agency::  [facility]  Relationship::  Spouse  Contact Information:  [phone]  Housing/Transportation Living arrangements for the past 2 months:  [place] Freedom of Information:  Patient Patient Interpreter Needed:  None Criminal Activity/Legal Involvement Pertinent to Current Situation/Hospitalization:  No - Comment as needed Significant Relationships:  Spouse Lives with:  Spouse Do you feel safe going back to the place where you live?  Yes Need for family participation in patient care:  No (Coment)  Care giving concerns:  CSW received consult for possible SNF placement at time of discharge. CSW spoke with patient regarding PT recommendation of SNF placement at time of discharge. Patient reported that patient's spouse is currently unable to care for patient at their home given patient's current physical needs and fall risk. He resides at [facility] and will return to their SNF at discharge. Patient expressed understanding of PT recommendation and is agreeable to SNF placement at time of discharge. CSW to continue to follow and assist with discharge planning needs.    Social Worker assessment / plan:  CSW spoke with patient concerning possibility of rehab at [facility] before returning home.  Employment status:  Retired Nurse, adult PT Recommendations:  [place] / Referral to community resources:  [place]  Patient/Family's Response to care:  Patient recognizes need for rehab before returning  home and is agreeable to a SNF in [place]. Patient reported preference for returning to [facility].  Patient/Family's Understanding of and Emotional Response to Diagnosis, Current Treatment, and Prognosis:  Patient/family is realistic regarding therapy needs and expressed being hopeful for SNF placement. Patient expressed understanding of CSW role and discharge process as well as medical condition. No questions/concerns about plan or treatment.    Emotional Assessment Appearance:  Appears stated age Attitude/Demeanor/Rapport:  Other(Appropriate) Affect (typically observed):  Accepting, Appropriate Orientation:  Oriented to Self, Oriented to Place, Oriented to  Time, Oriented to Situation Alcohol / Substance use:  Not Applicable Psych involvement (Current and /or in the community):  No (Comment)  Discharge Needs  Concerns to be addressed:  Care Coordination Readmission within the last 30 days:  No Current discharge risk:  None Barriers to Discharge:  Continued Medical Work up   [employer], [place] 01/12/2017, 11:40 AM

## 2017-01-12 NOTE — Progress Notes (Signed)
Pt discharged to [facility] skills facility via [place].  Report given to [facility] RN.

## 2017-01-12 NOTE — Clinical Social Work Placement (Signed)
   CLINICAL SOCIAL WORK PLACEMENT  NOTE  Date:  01/12/2017  Patient Details  Name: Jose Leach MRN: [id number] Date of Birth: Sep 24, 1932  Clinical Social Work is seeking post-discharge placement for this patient at the [place] level of care (*CSW will initial, date and re-position this form in  chart as items are completed):  Yes   Patient/family provided with [place] Work Department's list of facilities offering this level of care within the geographic area requested by the patient (or if unable, by the patient's family).  Yes   Patient/family informed of their freedom to choose among providers that offer the needed level of care, that participate in Medicare, Medicaid or managed care program needed by the patient, have an available bed and are willing to accept the patient.  Yes   Patient/family informed of [facility]'s ownership interest in [facility] and [facility], as well as of the fact that they are under no obligation to receive care at these facilities.  PASRR submitted to EDS on       PASRR number received on       Existing PASRR number confirmed on 01/12/17     FL2 transmitted to all facilities in geographic area requested by pt/family on       FL2 transmitted to all facilities within larger geographic area on 01/12/17     Patient informed that his/her managed care company has contracts with or will negotiate with certain facilities, including the following:        Yes   Patient/family informed of bed offers received.  Patient chooses bed at [facility]     Physician recommends and patient chooses bed at      Patient to be transferred to [facility] on 01/12/17.  Patient to be transferred to facility by [facility]     Patient family notified on 01/12/17 of transfer.  Name of family member notified:  patient responsible for self     PHYSICIAN Please prepare prescriptions     Additional Comment:     _______________________________________________ Normajean Baxter, LCSW 01/12/2017, 1:34 PM

## 2017-01-12 NOTE — Social Work (Signed)
CSW called [facility] at [facility] and discussed case. CSW advised that patient will return when medically ready.    No issues at this time.  CSW will continue to follow for disposition.  Elissa Hefty, LCSW Clinical Social Worker [phone]

## 2017-01-12 NOTE — Social Work (Signed)
Clinical Social Worker facilitated patient discharge including contacting patient family and facility to confirm patient discharge plans.  Clinical information faxed to facility and family agreeable with plan.    CSW arranged ambulance transport via [facility] to [facility] .    RN to call [phone] to give report prior to discharge.  Clinical Social Worker will sign off for now as social work intervention is no longer needed. Please consult [place] again if new need arises.  Elissa Hefty, LCSW Clinical Social Worker [phone]     Elissa Hefty, [place] Clinical Social Worker [phone]

## 2017-01-14 ENCOUNTER — Telehealth: Payer: Self-pay

## 2017-01-14 NOTE — Telephone Encounter (Signed)
This is a patient of [facility], who was admitted to [facility] after hospitalization. [facility] F/U is needed. Hospital discharge from [facility] on 01/12/2017.

## 2017-01-17 ENCOUNTER — Non-Acute Institutional Stay (SKILLED_NURSING_FACILITY): Payer: Medicare Other | Admitting: Internal Medicine

## 2017-01-17 ENCOUNTER — Encounter: Payer: Self-pay | Admitting: Internal Medicine

## 2017-01-17 DIAGNOSIS — M4802 Spinal stenosis, cervical region: Secondary | ICD-10-CM | POA: Diagnosis not present

## 2017-01-17 DIAGNOSIS — G9589 Other specified diseases of spinal cord: Secondary | ICD-10-CM

## 2017-01-17 DIAGNOSIS — N182 Chronic kidney disease, stage 2 (mild): Secondary | ICD-10-CM | POA: Diagnosis not present

## 2017-01-17 DIAGNOSIS — S32402A Unspecified fracture of left acetabulum, initial encounter for closed fracture: Secondary | ICD-10-CM | POA: Insufficient documentation

## 2017-01-17 DIAGNOSIS — S32402S Unspecified fracture of left acetabulum, sequela: Secondary | ICD-10-CM | POA: Diagnosis not present

## 2017-01-17 DIAGNOSIS — E039 Hypothyroidism, unspecified: Secondary | ICD-10-CM

## 2017-01-17 DIAGNOSIS — R2681 Unsteadiness on feet: Secondary | ICD-10-CM

## 2017-01-17 DIAGNOSIS — D649 Anemia, unspecified: Secondary | ICD-10-CM | POA: Diagnosis not present

## 2017-01-17 DIAGNOSIS — K5901 Slow transit constipation: Secondary | ICD-10-CM

## 2017-01-17 DIAGNOSIS — E782 Mixed hyperlipidemia: Secondary | ICD-10-CM

## 2017-01-17 DIAGNOSIS — G992 Myelopathy in diseases classified elsewhere: Secondary | ICD-10-CM

## 2017-01-17 DIAGNOSIS — I739 Peripheral vascular disease, unspecified: Secondary | ICD-10-CM

## 2017-01-17 DIAGNOSIS — E43 Unspecified severe protein-calorie malnutrition: Secondary | ICD-10-CM | POA: Diagnosis not present

## 2017-01-17 DIAGNOSIS — F339 Major depressive disorder, recurrent, unspecified: Secondary | ICD-10-CM

## 2017-01-17 NOTE — Progress Notes (Signed)
Provider:  [name] MD  Location:  [facility] Room Number: 22 Place of Service:  SNF (31)  PCP: [name], MD Patient Care Team: [name], MD as PCP - General (Internal Medicine) [name], MD as Consulting Physician (Urology) [name], MD as Consulting Physician (Neurology) [name], MD as Consulting Physician (Dermatology) [name], MD (Inactive) as Consulting Physician (Gastroenterology) [name], MD as Consulting Physician (Orthopedic Surgery) [name], MD as Consulting Physician (Cardiology) [name], MD as Consulting Physician (Ophthalmology) [name], MD as Referring Physician (Psychiatry) [name], MD as Consulting Physician (General Surgery) [name], MD as Consulting Physician (Orthopedic Surgery)  Extended Emergency Contact Information Primary Emergency Contact: Klinger,[name] Address: [address]          [place], [place] [address] [place] Phone: [phone] Relation: Spouse  Code Status: DNR  Goals of Care: Advanced Directive information Advanced Directives 01/17/2017  Does Patient Have a Medical Advance Directive? Yes  Type of Paramedic of [place];Out of facility DNR (pink MOST or yellow form);Living will  Does patient want to make changes to medical advance directive? -  Copy of [place] in Chart? Yes  Pre-existing out of facility DNR order (yellow form or pink MOST form) Yellow form placed in chart (order not valid for inpatient use);Pink MOST form placed in chart (order not valid for inpatient use)      Chief Complaint  Patient presents with  . Readmit To SNF    re-admit from hospital, review goals of care    HPI: Patient is a 83 y.o. male seen today for re-admission visit. He was in the hospital from 01/10/17-01/12/17. He was residing at independent living and  due to fall with incontinence and worsening weakness, he was transferred to SNF for rehab and possible long term care. On admission visit for SNF, he had new weakness of LLE and new incontinence. Given his history of severe cervical spinal stenosis and new/ worsening symptom, with concern for cord compression, he was sent to ED for further evaluation. He was admitted to the hospital on above date. He was diagnosed to have left hip non displaced acetabular fracture. He was seen by orthopedic and pain management and therapy with touchdown weight bearing to LLE was recommended. He was also seen by neurosurgery and they think he most likely will need decompression given his progressive symptom but would like to wait for recovery from his fracture. He is seen in his room today with his wife and daughter at bedside and son on speaker phone.   Past Medical History:  Diagnosis Date  . Acute urinary retention 01/28/2013  . AKI (acute kidney injury) ([place]) 01/26/2013  . Altered mental status 09/09/2013  . Anemia    B12 deficiency  . ANXIETY 06/07/2007   Qualifier: Diagnosis of  By: Talbert Cage CMA ([place]), June    . ARTHRITIS 06/07/2007   Qualifier: Diagnosis of  By: Talbert Cage CMA ([place]), June    . Balance problems 04/05/2011  . Benign essential tremor   . Benign fibroma of prostate 10/11/2011  . Bilateral inguinal hernia 10/09/2012  . Bilateral leg edema 09/13/2012  . Bladder retention 02/05/2013  . Bradycardia, sinus 08/02/2012  . CAROTID ARTERY DISEASE 03/31/2007   Qualifier: Diagnosis of  By: Linna Darner MD, Gwyndolyn Saxon    . Carotid stenosis    s/p  L CEA  . Cataract, nuclear 03/21/2014  . 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 Cervical spinal stenosis   . Cervical stenosis of spine   . Critical lower limb ischemia   . Degeneration macular 11/02/2011  . Depression    Dr Albertine Patricia  . Difficulty in walking(719.7) 04/05/2011  . ESOPHAGITIS 08/06/2002   Qualifier: Diagnosis of  By: Talbert Cage CMA ([place]), June    . Essential and  other specified forms of tremor 04/18/2013  . Fall at home 01/06/2017   left hip pain  . Falls   . Family history of colon cancer 07/24/2012  . Fecal impaction ([place]) 10/11/2012  . Femoral hernia, bilateral s/p lap repair 01/16/2013 01/16/2013  . Glaucoma, compensated 03/21/2014  . H/O cardiovascular stress test    a. 02/2003 -> no ischemia/infarct.  Marland Kitchen HAMMER TOE 04/23/2010   Qualifier: Diagnosis of  By: Linna Darner MD, Gwyndolyn Saxon    . Hematoma of leg   . Hip hematoma, right 09/09/2013  . History of shingles 2009  . HTN (hypertension) 08/22/2012  . Hx of echocardiogram    Echocardiogram 08/01/12: Mild LVH, EF 55-60%, normal wall motion.  . Hydrocele 10/11/2011  . Hyperlipidemia   . 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 Hypothyroidism   . Macular degeneration disease   . Macular edema   . Migraines    Dr Jannifer Franklin  . MORTON'S NEUROMA, LEFT 08/22/2007   Qualifier: Diagnosis of  By: Linna Darner MD, Gwyndolyn Saxon    . PAD (peripheral artery disease) ([place])   . Peripheral neuropathy   . Personal history of colonic polyps 01/05/2013   2014 2 mm adenomatous polyp   . PREMATURE ATRIAL CONTRACTIONS 03/14/2006   Qualifier: Diagnosis of  By: Linna Darner MD, Gwendolyn Lima 12/16/2011  . SPINAL STENOSIS 03/14/2006   Qualifier: Diagnosis of  By: Linna Darner MD, William   Cervical spine    . Testosterone deficiency    Dr Lawerance Bach, [facility]  . Unspecified vitamin D deficiency 08/06/2012  . Urge incontinence 10/09/2012  . Urinary tract infection, site not specified 02/13/2013  . Weakness 08/02/2012   Past Surgical History:  Procedure Laterality Date  . APPENDECTOMY  1941  . BREAST CYST EXCISION Left 01/16/2013   Procedure: MASS EXCISION AXILLA;  Surgeon: Adin Hector, MD;  Location: [facility];  Service: General;  Laterality: Left;  . CAROTID ENDARTERECTOMY  2005    L  . CATARACT EXTRACTION Right   . COLONOSCOPY  2014   Dr. Deatra Ina  . EYE SURGERY Right 02/2012   retina  . INGUINAL HERNIA REPAIR Bilateral 01/16/2013   Procedure:  LAPAROSCOPIC BILATERAL INGUINAL DIRECT AND INDIRECT, FEMORAL, AND OBTURATOR HERNIAS;  Surgeon: Adin Hector, MD;  Location: [facility];  Service: General;  Laterality: Bilateral;  . INSERTION OF MESH N/A 01/16/2013   Procedure: INSERTION OF MESH;  Surgeon: Adin Hector, MD;  Location: [facility];  Service: General;  Laterality: N/A;  . LUMBAR LAMINECTOMY    . TONSILLECTOMY AND ADENOIDECTOMY    . TOTAL HIP ARTHROPLASTY  2005   L    reports that he quit smoking about 37 years ago. His smoking use included pipe and cigars. He quit after 10.00 years of use. he has never used smokeless tobacco. He reports that he does not drink alcohol or use drugs. Social History   Socioeconomic History  . Marital status: Married    Spouse name: [name]  . Number of children: 2  . Years of education: Not on file  . Highest education  level: Not on file  Social Needs  . Financial resource strain: Not on file  . Food insecurity - worry: Not on file  . Food insecurity - inability: Not on file  . Transportation needs - medical: Not on file  . Transportation needs - non-medical: Not on file  Occupational History  . Occupation: retired     Fish farm manager: RETIRED  Tobacco Use  . Smoking status: Former Smoker    Years: 10.00    Types: Pipe, Cigars    Last attempt to quit: 02/16/1979    Years since quitting: 37.9  . Smokeless tobacco: Never Used  . Tobacco comment: Quit about age 7   Substance and Sexual Activity  . Alcohol use: No    Alcohol/week: 2.4 oz    Types: 4 Standard drinks or equivalent per week  . Drug use: No  . Sexual activity: No  Other Topics Concern  . Not on file  Social History Narrative   L[facility] at [facility] since 2013   Married - [name]   Former smoker -stopped 1981   Alcohol -wine occasionally    Exercise - walking, Ne-step    POA, Living Will   [employer] with walker    Functional Status Survey:    Family History  Problem Relation Age of Onset  . Stroke Mother 17  . Hypertension  Mother   . Diabetes Mother   . Heart disease Mother   . Rectal cancer Father 88  . Heart disease Father        in 68s  . Cancer Father        colorectal  . Nephritis Sister   . Seizures Brother        died as child    Health Maintenance  Topic Date Due  . TETANUS/TDAP  02/15/2018 (Originally 02/15/1951)  . INFLUENZA VACCINE  Completed  . PNA vac Low Risk Adult  Completed    Allergies  Allergen Reactions  . Latex Rash  . Tape Rash    Paper tape is ok to use     Outpatient Encounter Medications as of 01/17/2017  Medication Sig  . acetaminophen (TYLENOL) 325 MG tablet Take 2 tablets (650 mg total) by mouth every 6 (six) hours as needed for mild pain (or Fever >/= 101).  Marland Kitchen aspirin 81 MG tablet Take 81 mg by mouth every morning.   Marland Kitchen atorvastatin (LIPITOR) 20 MG tablet Take one tablet by mouth once daily for cholesterol  . B Complex-C (B-COMPLEX WITH VITAMIN C) tablet Take 1 tablet by mouth daily.  . bimatoprost (LUMIGAN) 0.01 % SOLN Place 1 drop into both eyes at bedtime.  Marland Kitchen buPROPion (WELLBUTRIN XL) 150 MG 24 hr tablet Take 150 mg by mouth daily.   . Cholecalciferol (VITAMIN D3) 1000 units CAPS Take 1,000 Units by mouth daily.  . clonazePAM (KLONOPIN) 0.5 MG tablet Take 0.5 tablets (0.25 mg total) by mouth at bedtime as needed for anxiety (sleep). Take one daily for anxiety  . fluticasone (CUTIVATE) 0.05 % cream Apply 1 application topically 2 (two) times daily as needed (irritation).   Marland Kitchen HYDROcodone-acetaminophen (NORCO/VICODIN) 5-325 MG tablet Take 1 tablet by mouth every 4 (four) hours as needed for moderate pain.  Marland Kitchen ketoconazole (NIZORAL) 2 % cream Apply 1 application topically daily as needed for irritation.   Javier Docker Oil 500 MG CAPS Take 500 mg by mouth every morning.   Marland Kitchen levothyroxine (SYNTHROID) 50 MCG tablet Take 1/2 tablet by mouth on Sunday mornings and Take  one tablet by mouth every other morning for thryoid.  Marland Kitchen linaclotide (LINZESS) 145 MCG CAPS capsule Take 1 capsule  (145 mcg total) by mouth daily before breakfast.  . mirtazapine (REMERON) 30 MG tablet Take 1 tablet (30 mg total) by mouth at bedtime.  . Multiple Vitamins-Minerals (ICAPS PO) Take 1 tablet by mouth daily.  . polyethylene glycol (MIRALAX / GLYCOLAX) packet Take 17 g by mouth daily as needed for mild constipation.  . sertraline (ZOLOFT) 100 MG tablet Take 200 mg by mouth every morning.   . tamsulosin (FLOMAX) 0.4 MG CAPS capsule TAKE 1 CAPSULE (0.4 MG TOTAL) BY MOUTH DAILY.  Marland Kitchen topiramate (TOPAMAX) 25 MG tablet TAKE ONE TABLET BY MOUTH EVERY MORNING AND 2 TABLETS IN THE EVENING   No facility-administered encounter medications on file as of 01/17/2017.     Review of Systems  Constitutional: Negative for appetite change, chills and fever.  HENT: Negative for congestion, mouth sores and trouble swallowing.   Respiratory: Negative for cough and shortness of breath.   Cardiovascular: Negative for chest pain, palpitations and leg swelling.  Gastrointestinal: Negative for abdominal pain, constipation, nausea and vomiting.       Denies bowel incontinence  Genitourinary: Negative for dysuria.       Has UI episodes  Musculoskeletal: Positive for arthralgias, back pain and gait problem.  Skin: Negative for rash and wound.  Neurological: Positive for weakness and numbness. Negative for dizziness and headaches.       Occassional numbness to his hands  Hematological: Bruises/bleeds easily.    Vitals:   01/17/17 1710  BP: 135/77  Pulse: 60  Resp: 12  Temp: 99.2 F (37.3 C)  SpO2: 97%   There is no height or weight on file to calculate BMI. Physical Exam  Constitutional: He is oriented to person, place, and time. No distress.  Thin built and frail elderly male  HENT:  Head: Atraumatic.  Nose: Nose normal.  Mouth/Throat: Oropharynx is clear and moist. No oropharyngeal exudate.  Eyes: Conjunctivae are normal. Pupils are equal, round, and reactive to light. Right eye exhibits no discharge.  Left eye exhibits no discharge.  Neck: Normal range of motion. Neck supple.  Cardiovascular: Normal rate and regular rhythm.  Pulmonary/Chest: Effort normal and breath sounds normal. No respiratory distress. He has no wheezes.  Abdominal: Soft. Bowel sounds are normal. There is no tenderness. There is no rebound and no guarding.  Musculoskeletal: He exhibits deformity. He exhibits no edema.  Unsteady gait, needs 1 person max assist and 2 person moderate assist with transfer, wheelchair for ambulation, limited left hip ROM. Lower extremity weakness. Arthritis changes present  Lymphadenopathy:    He has no cervical adenopathy.  Neurological: He is alert and oriented to person, place, and time.  Familial tremor present. Slow speech. Vocal tremor present  Skin: Skin is warm and dry. He is not diaphoretic.  Psychiatric: He has a normal mood and affect.    Labs reviewed: Basic Metabolic Panel: Recent Labs    01/08/17 1356 01/10/17 1528 01/11/17 0532  NA 140 140 141  K 4.1 4.8 4.1  CL 111 112* 111  CO2 23 21* 25  GLUCOSE 95 87 92  BUN 35* 30* 28*  CREATININE 1.26* 1.13 1.07  CALCIUM 8.7* 8.8* 8.3*   Liver Function Tests: Recent Labs    05/13/16 2021 11/15/16 0800 01/10/17 1528 01/11/17 0532  AST 20 19 18 16   ALT 15* 21 17 15*  ALKPHOS 38  --  38 38  BILITOT 0.4 0.6 0.7 0.7  PROT 5.8* 6.8 6.2* 5.3*  ALBUMIN 3.2*  --  3.2* 2.8*   No results for input(s): LIPASE, AMYLASE in the last 8760 hours. No results for input(s): AMMONIA in the last 8760 hours. CBC: Recent Labs    05/13/16 2021  01/08/17 1356 01/10/17 1528 01/11/17 0532  WBC 4.7   < > 6.6 6.9 6.3  NEUTROABS 3.3  --  4.6 5.3  --   HGB 13.1   < > 12.4* 12.4* 11.9*  HCT 40.2   < > 38.6* 39.3 38.1*  MCV 92.6   < > 96.5 97.3 96.2  PLT 128*   < > 141* 152 143*   < > = values in this interval not displayed.   Cardiac Enzymes: No results for input(s): CKTOTAL, CKMB, CKMBINDEX, TROPONINI in the last 8760  hours. BNP: Invalid input(s): POCBNP Lab Results  Component Value Date   HGBA1C 5.3 07/31/2012   Lab Results  Component Value Date   TSH 4.866 (H) 01/11/2017   Lab Results  Component Value Date   HBZJIRCV89 381 08/02/2012   No results found for: FOLATE No results found for: IRON, TIBC, FERRITIN  Imaging and Procedures obtained prior to SNF admission: Dg Chest 2 View  Result Date: 01/11/2017 CLINICAL DATA:  Weakness for 1 day EXAM: CHEST  2 VIEW COMPARISON:  06/08/2016 FINDINGS: The heart size and mediastinal contours are within normal limits. Both lungs are clear. The visualized skeletal structures are unremarkable. : Inter positioning beneath the right diaphragm is felt to contribute to lucency beneath the right diaphragm. IMPRESSION: No active cardiopulmonary disease. Electronically Signed   By: Donavan Foil M.D.   On: 01/11/2017 00:29   Ct Head Wo Contrast  Result Date: 01/10/2017 CLINICAL DATA:  Fall on Thursday.  Weakness. EXAM: CT HEAD WITHOUT CONTRAST TECHNIQUE: Contiguous axial images were obtained from the base of the skull through the vertex without intravenous contrast. COMPARISON:  05/13/2016 FINDINGS: Brain: There is atrophy and chronic small vessel disease changes. No acute intracranial abnormality. Specifically, no hemorrhage, hydrocephalus, mass lesion, acute infarction, or significant intracranial injury. Vascular: No hyperdense vessel or unexpected calcification. Skull: No acute calvarial abnormality. Sinuses/Orbits: Visualized paranasal sinuses and mastoids clear. Orbital soft tissues unremarkable. Other: None IMPRESSION: No acute intracranial abnormality. Atrophy, chronic microvascular disease. Electronically Signed   By: Rolm Baptise M.D.   On: 01/10/2017 16:55   Ct Pelvis Wo Contrast  Result Date: 01/10/2017 CLINICAL DATA:  83 year old male fell yesterday onto left hip. Left hip pain. Surgery 15 years ago. Subsequent encounter. EXAM: CT PELVIS WITHOUT CONTRAST  TECHNIQUE: Multidetector CT imaging of the pelvis was performed following the standard protocol without intravenous contrast. COMPARISON:  01/08/2017 plain film exam. FINDINGS: Urinary Tract: Evaluation of bladder limited by lack of contrast and streak artifact. Bowel: Evaluation of bowel limited by lack of contrast and streak artifact Vascular/Lymphatic: Calcified aorta and iliac arteries. No adenopathy. Reproductive:  Evaluation prostate gland limited by streak artifact. Other:  Hydroceles. Musculoskeletal: Post left hip replacement. Nondisplaced fracture left acetabulum/ilium medial to the left acetabular screws. Marked right hip joint degenerative changes. Prominent degenerative changes lower lumbar spine. IMPRESSION: Post left hip replacement. Nondisplaced fracture left acetabulum/ilium medial to the left acetabular screws (series 4, images 29 through 33). Marked right hip joint degenerative changes. Prominent degenerative changes lower lumbar spine. Electronically Signed   By: Genia Del M.D.   On: 01/10/2017 17:00   Mr Cervical Spine Wo Contrast  Result Date: 01/10/2017 CLINICAL  DATA:  83 y/o  M; status post fall and incontinence. EXAM: MRI CERVICAL SPINE WITHOUT CONTRAST TECHNIQUE: Multiplanar, multisequence MR imaging of the cervical spine was performed. No intravenous contrast was administered. COMPARISON:  11/16/2016 MRI of the cervical spine. 11/10/2011 CT of the cervical spine. FINDINGS: Alignment: Stable straightening of cervical lordosis. Stable grade 1 C7-T1 anterolisthesis. Vertebrae: No fracture, evidence of discitis, or bone lesion. Vertebral body heights are preserved. No soft tissue edema or prevertebral fluid collection to suggest ligamentous injury. 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 No new abnormal cord signal. No evidence for cord hemorrhage. Posterior Fossa, vertebral arteries,  paraspinal tissues: Negative. Disc levels:  Congenital cervical spinal canal stenosis. C2-3: Stable disc osteophyte complex eccentric to the left and right greater than left uncovertebral and facet hypertrophy. Severe right and mild left foraminal stenosis. Moderate canal stenosis with left anterior cord impingement and flattening. C3-4: Stable disc osteophyte complex and extensive uncovertebral and facet hypertrophy. Severe bilateral foraminal stenosis. Moderate canal stenosis with cord impingement and flattening. C4-5: Stable disc osteophyte complex, central protrusion, and left-greater-than-right uncovertebral/ facet hypertrophy. Moderate right and severe left foraminal stenosis. Severe canal stenosis with cord flattening. C5-6: Stable disc osteophyte complex with bilateral uncovertebral and facet hypertrophy. Moderate to severe bilateral foraminal stenosis. Severe canal stenosis with cord flattening. C6-7: Stable disc osteophyte complex with bilateral uncovertebral and facet hypertrophy greater on the left. Moderate right and severe left foraminal stenosis. Moderate canal stenosis with anterior cord impingement and flattening. C7-T1: Stable grade 1 anterolisthesis with uncovered disc bulge and mild bilateral uncovertebral and facet hypertrophy. Mild bilateral foraminal and canal stenosis. IMPRESSION: 1. No acute osseous abnormality or findings of ligamentous injury. 2. Stable right lateral C4-5 cord signal likely representing chronic compressive myelopathy. 3. 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 4. Stable severe C4-5 and C5-6 canal stenosis. Moderate C2-3, C3-4, C6-7 canal stenosis. 5. Stable multiple levels of high-grade foraminal stenosis as above. Electronically Signed   By: Kristine Garbe M.D.   On: 01/10/2017 21:47    Assessment/Plan  Unsteady gait Will have him work with physical therapy and occupational therapy team to help with gait  training and muscle strengthening exercises.fall precautions. Skin care. Encourage to be out of bed.   Left hip fracture Seen by orthopedic. TDWB to LLE. Will have patient work with PT/OT as tolerated to regain strength and restore function.  Fall precautions are in place. Will need f/u with orthopedics. Continue norco 5-325 mg q4h prn pain and change tylenol to 1000 mg bid with prn in between. Continue baby aspirin for dvt prophylaxis with his limited mobility  Goals of care discussion With pt, his wife, daughter and social worker present in the room and son on speaker phone. HCPOA paperwork reviewed, all 3 are listed as HCPOA. Reviewed goals of care, filled out and signed DNR and MOST form between 10 am and 10:45 am. Patient would like to be DNR. In presence of pulse and/ or if he is breathing, he would like full scope of treatment. He would then have his family decide on when to progress to limited intervention vs comfort measures depending on his illness. He would want iv fluids and antibiotic if indicated for defined trial period. He would like a feeding tube for defined trial period if indicated. He does not want to be left with feeding tube for indefinite period but he would like his  family to decide on this when time comes. He understands that he is at high risk for falls given his unsteady gait, recent left hip fracture and his severe cervical spinal stenosis. His wife, daughter and son agree with his wishes and he has signed the form today. Form signed and copies made and provided to family. Answered questions regarding his current prognosis, scope for improvement with therapy.   Severe cervical spinal stenosis with myelopathy He will need decompression to help prevent progression of symptoms per neurosurgery. Plan is to evaluate for this in 2-3 months post recovery from hip fracture.   Protein calorie malnutrition RD consult, monitor po intake. Continue remeron 30 mg daily.    Hyperlipidemia Continue statin  Chronic depression Continue sertraline and bupropion for now, no changes made. Continue prn clonazepam for now for anxiety.   Hypothyroidism Slightly elevated TSH on review. Continue levothyroxine for now and check TSH in 1 week.   Chronic constipation Continue linzess and prn miralax  ckd stage 3 Monitor bmp  PAD Continue statin and aspirin  Anemia Low but stable h&h. Check cbc   Family/ staff Communication: reviewed care plan with patient, his family members and charge nurse.    Labs/tests ordered: cbc, cmp, tsh  [name], MD Internal Medicine [facility] [address] [place], [place] [address] Cell Phone (Monday-Friday 8 am - 5 pm): [phone] On Call: [phone] and follow prompts after 5 pm and on weekends Office Phone: [phone] Office Fax: [phone]

## 2017-01-22 ENCOUNTER — Other Ambulatory Visit: Payer: Self-pay | Admitting: Nurse Practitioner

## 2017-01-24 ENCOUNTER — Other Ambulatory Visit: Payer: Self-pay | Admitting: Nurse Practitioner

## 2017-01-24 LAB — BASIC METABOLIC PANEL
BUN: 28 — AB (ref 4–21)
CREATININE: 1.2 (ref 0.6–1.3)
GLUCOSE: 101
POTASSIUM: 4.4 (ref 3.4–5.3)
Sodium: 140 (ref 137–147)

## 2017-01-24 LAB — HEPATIC FUNCTION PANEL
ALT: 10 (ref 10–40)
AST: 12 — AB (ref 14–40)
Alkaline Phosphatase: 114 (ref 25–125)
Bilirubin, Total: 0.5

## 2017-01-24 LAB — CBC AND DIFFERENTIAL
HCT: 42 (ref 41–53)
HEMOGLOBIN: 13.4 — AB (ref 13.5–17.5)
PLATELETS: 239 (ref 150–399)
WBC: 6.3

## 2017-01-24 LAB — TSH: TSH: 3.63 (ref <=5.90)

## 2017-01-26 ENCOUNTER — Encounter: Payer: Self-pay | Admitting: Family

## 2017-01-26 ENCOUNTER — Non-Acute Institutional Stay (SKILLED_NURSING_FACILITY): Payer: Medicare Other | Admitting: Family

## 2017-01-26 DIAGNOSIS — L89150 Pressure ulcer of sacral region, unstageable: Secondary | ICD-10-CM

## 2017-01-26 DIAGNOSIS — S72002D Fracture of unspecified part of neck of left femur, subsequent encounter for closed fracture with routine healing: Secondary | ICD-10-CM | POA: Diagnosis not present

## 2017-01-26 NOTE — Progress Notes (Signed)
Location:  [facility] Room Number: 34 Place of Service:  SNF [phone]) Provider: Samuell Knoble FNP-C  [name], MD  Patient Care Team: [name], MD as PCP - General (Internal Medicine) [name], MD as Consulting Physician (Urology) [name], MD as Consulting Physician (Neurology) [name], MD as Consulting Physician (Dermatology) [name], MD (Inactive) as Consulting Physician (Gastroenterology) [name], MD as Consulting Physician (Orthopedic Surgery) [name], MD as Consulting Physician (Cardiology) [name], MD as Consulting Physician (Ophthalmology) [name], MD as Referring Physician (Psychiatry) [name], MD as Consulting Physician (General Surgery) [name], MD as Consulting Physician (Orthopedic Surgery)  Extended Emergency Contact Information Primary Emergency Contact: Mirsky,[name] Address: [address]          [place], [place] [address] [place] Phone: [phone] Relation: Spouse  Code Status:  DNR Goals of care: Advanced Directive information Advanced Directives 01/26/2017  Does Patient Have a Medical Advance Directive? Yes  Type of Paramedic of [place];Living will;Out of facility DNR (pink MOST or yellow form)  Does patient want to make changes to medical advance directive? -  Copy of [place] in Chart? Yes  Pre-existing out of facility DNR order (yellow form or pink MOST form) Pink MOST form placed in chart (order not valid for inpatient use);Yellow form placed in chart (order not valid for inpatient use)     Chief Complaint  Patient presents with  . Acute Visit    sacral redness and raised hardness to groin area    HPI:  Pt is a 83 y.o. male seen today at [facility] for an acute visit for evaluation of sacral redness and hard area on left gluteal.He is seen in his  room today with wife at bedside.He states left hip pain under control with current pain medication.Patient's wife states patient has a raised area on left buttock wonders when he is going to have surgery for the left hip fracture.Per MD record reviewed patient was seen by Orthopedic recommended TDWB to LLE and continue with Physical therapy.Left gluteal raised from previous falls non-tender and hasn't increased in size. Also request Ketoconazole 2% and Fluticasone 0.05% cream to be applied to buttocks.He states used this cream in the past. He has a resolving pressure ulcer facility staff currently applying Zinc oxide. Patient encouraged to continue with zinc oxide to prevent pressure ulcer recurrence.    Past Medical History:  Diagnosis Date  . Acute urinary retention 01/28/2013  . AKI (acute kidney injury) ([place]) 01/26/2013  . Altered mental status 09/09/2013  . Anemia    B12 deficiency  . ANXIETY 06/07/2007   Qualifier: Diagnosis of  By: Talbert Cage CMA ([place]), June    . ARTHRITIS 06/07/2007   Qualifier: Diagnosis of  By: Talbert Cage CMA ([place]), June    . Balance problems 04/05/2011  . Benign essential tremor   . Benign fibroma of prostate 10/11/2011  . Bilateral inguinal hernia 10/09/2012  . Bilateral leg edema 09/13/2012  . Bladder retention 02/05/2013  . Bradycardia, sinus 08/02/2012  . CAROTID ARTERY DISEASE 03/31/2007   Qualifier: Diagnosis of  By: Linna Darner MD, Gwyndolyn Saxon    . Carotid stenosis    s/p L CEA  . Cataract, nuclear 03/21/2014  . 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 Cervical spinal stenosis   . Cervical stenosis of spine   . Critical lower limb  ischemia   . Degeneration macular 11/02/2011  . Depression    Dr Albertine Patricia  . Difficulty in walking(719.7) 04/05/2011  . ESOPHAGITIS 08/06/2002   Qualifier: Diagnosis of  By: Talbert Cage CMA ([place]), June    . Essential and other specified forms of tremor 04/18/2013  . Fall at home 01/06/2017   left hip pain  . Falls   . Family history of colon cancer  07/24/2012  . Fecal impaction ([place]) 10/11/2012  . Femoral hernia, bilateral s/p lap repair 01/16/2013 01/16/2013  . Glaucoma, compensated 03/21/2014  . H/O cardiovascular stress test    a. 02/2003 -> no ischemia/infarct.  Marland Kitchen HAMMER TOE 04/23/2010   Qualifier: Diagnosis of  By: Linna Darner MD, Gwyndolyn Saxon    . Hematoma of leg   . Hip hematoma, right 09/09/2013  . History of shingles 2009  . HTN (hypertension) 08/22/2012  . Hx of echocardiogram    Echocardiogram 08/01/12: Mild LVH, EF 55-60%, normal wall motion.  . Hydrocele 10/11/2011  . Hyperlipidemia   . 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 Hypothyroidism   . Macular degeneration disease   . Macular edema   . Migraines    Dr Jannifer Franklin  . MORTON'S NEUROMA, LEFT 08/22/2007   Qualifier: Diagnosis of  By: Linna Darner MD, Gwyndolyn Saxon    . PAD (peripheral artery disease) ([place])   . Peripheral neuropathy   . Personal history of colonic polyps 01/05/2013   2014 2 mm adenomatous polyp   . PREMATURE ATRIAL CONTRACTIONS 03/14/2006   Qualifier: Diagnosis of  By: Linna Darner MD, Gwendolyn Lima 12/16/2011  . SPINAL STENOSIS 03/14/2006   Qualifier: Diagnosis of  By: Linna Darner MD, William   Cervical spine    . Testosterone deficiency    Dr Lawerance Bach, [facility]  . Unspecified vitamin D deficiency 08/06/2012  . Urge incontinence 10/09/2012  . Urinary tract infection, site not specified 02/13/2013  . Weakness 08/02/2012   Past Surgical History:  Procedure Laterality Date  . APPENDECTOMY  1941  . BREAST CYST EXCISION Left 01/16/2013   Procedure: MASS EXCISION AXILLA;  Surgeon: Adin Hector, MD;  Location: [facility];  Service: General;  Laterality: Left;  . CAROTID ENDARTERECTOMY  2005    L  . CATARACT EXTRACTION Right   . COLONOSCOPY  2014   Dr. Deatra Ina  . EYE SURGERY Right 02/2012   retina  . INGUINAL HERNIA REPAIR Bilateral 01/16/2013   Procedure: LAPAROSCOPIC BILATERAL INGUINAL DIRECT AND INDIRECT, FEMORAL, AND OBTURATOR HERNIAS;  Surgeon: Adin Hector, MD;  Location: [facility];   Service: General;  Laterality: Bilateral;  . INSERTION OF MESH N/A 01/16/2013   Procedure: INSERTION OF MESH;  Surgeon: Adin Hector, MD;  Location: [facility];  Service: General;  Laterality: N/A;  . LUMBAR LAMINECTOMY    . TONSILLECTOMY AND ADENOIDECTOMY    . TOTAL HIP ARTHROPLASTY  2005   L    Allergies  Allergen Reactions  . Latex Rash  . Tape Rash    Paper tape is ok to use     Outpatient Encounter Medications as of 01/26/2017  Medication Sig  . acetaminophen (TYLENOL) 325 MG tablet Take 2 tablets (650 mg total) by mouth every 6 (six) hours as needed for mild pain (or Fever >/= 101).  Marland Kitchen acetaminophen (TYLENOL) 500 MG tablet Take 1,000 mg by mouth 2 (two) times daily.  Marland Kitchen aspirin 81 MG tablet Take 81 mg by mouth every morning.   Marland Kitchen atorvastatin (LIPITOR) 20 MG tablet Take one tablet by mouth once  daily for cholesterol  . B Complex-C (B-COMPLEX WITH VITAMIN C) tablet Take 1 tablet by mouth daily.  . bimatoprost (LUMIGAN) 0.01 % SOLN Place 1 drop into both eyes at bedtime.  Marland Kitchen buPROPion (WELLBUTRIN XL) 150 MG 24 hr tablet Take 150 mg by mouth daily.   . Cholecalciferol (VITAMIN D3) 1000 units CAPS Take 1,000 Units by mouth daily.  . clonazePAM (KLONOPIN) 0.5 MG tablet Take 0.5 tablets (0.25 mg total) by mouth at bedtime as needed for anxiety (sleep). Take one daily for anxiety  . fluticasone (CUTIVATE) 0.05 % cream Apply 1 application topically 2 (two) times daily as needed (irritation).   Marland Kitchen HYDROcodone-acetaminophen (NORCO/VICODIN) 5-325 MG tablet Take 1 tablet by mouth every 4 (four) hours as needed for moderate pain.  Marland Kitchen ketoconazole (NIZORAL) 2 % cream Apply 1 application topically daily as needed for irritation.   Javier Docker Oil 500 MG CAPS Take 500 mg by mouth every morning.   Marland Kitchen levothyroxine (SYNTHROID) 50 MCG tablet Take 1/2 tablet by mouth on Sunday mornings and Take one tablet by mouth every other morning for thryoid.  Marland Kitchen linaclotide (LINZESS) 145 MCG CAPS capsule Take 1 capsule  (145 mcg total) by mouth daily before breakfast.  . mirtazapine (REMERON) 30 MG tablet Take 1 tablet (30 mg total) by mouth at bedtime.  . Multiple Vitamins-Minerals (ICAPS PO) Take 1 tablet by mouth daily.  . Nutritional Supplements (BOOST PO) Take 1 Can by mouth daily.  . polyethylene glycol (MIRALAX / GLYCOLAX) packet Take 17 g by mouth daily as needed for mild constipation.  . sertraline (ZOLOFT) 100 MG tablet Take 200 mg by mouth every morning.   . tamsulosin (FLOMAX) 0.4 MG CAPS capsule TAKE 1 CAPSULE (0.4 MG TOTAL) BY MOUTH DAILY.  Marland Kitchen topiramate (TOPAMAX) 25 MG tablet TAKE 1 TABLET BY MOUTH EVERY MORNING AND 2 TABLETS BY MOUTH EVERY EVENING   No facility-administered encounter medications on file as of 01/26/2017.     Review of Systems  Constitutional: Negative for activity change, chills and fever.  Respiratory: Negative for chest tightness, shortness of breath and wheezing.   Cardiovascular: Negative for chest pain, palpitations and leg swelling.  Gastrointestinal: Negative for abdominal distention, abdominal pain, constipation, diarrhea, nausea and vomiting.  Musculoskeletal: Positive for arthralgias and gait problem.  Skin: Negative for color change, pallor and rash.  Neurological: Positive for weakness. Negative for dizziness, syncope, light-headedness and headaches.  Psychiatric/Behavioral: Negative for agitation, confusion and sleep disturbance. The patient is not nervous/anxious.     Immunization History  Administered Date(s) Administered  . DTaP 11/05/2005  . Hepatitis B 14-Jun-1932  . Influenza Split 11/12/2011, 11/29/2013  . Influenza Whole 10/30/2007, 11/12/2008, 11/26/2009  . Influenza, High Dose Seasonal PF 12/31/2015, 11/24/2016  . PPD Test 06/23/2011  . Pneumococcal Conjugate-13 01/14/2016  . Pneumococcal Polysaccharide-23 02/16/1999  . Varicella 11/09/2005  . Zoster 08/30/2006   Pertinent  Health Maintenance Due  Topic Date Due  . INFLUENZA VACCINE   Completed  . PNA vac Low Risk Adult  Completed   Fall Risk  10/27/2016 06/09/2016 02/03/2016 10/28/2015 01/16/2015  Falls in the past year? Yes Yes Yes Yes Yes  Number falls in past yr: 2 or more 2 or more 2 or more 2 or more 2 or more  Comment - - 2 falls in last 2 months - -  Injury with Fall? Yes No Yes No (No Data)  Comment L hip - scraps and bruises - head  Risk Factor Category  - - - - -  Risk for fall due to : - - - Impaired balance/gait -  Follow up - - - - -     Vitals:   01/26/17 1434  BP: 126/72  Pulse: 80  Resp: 18  Temp: 98 F (36.7 C)  SpO2: 97%  Weight: 143 lb 12.8 oz (65.2 kg)  Height: 6' (1.829 m)   Body mass index is 19.5 kg/m. Physical Exam  Constitutional: He is oriented to person, place, and time.  Thin tall frail elderly in no acute distress   HENT:  Head: Normocephalic.  Mouth/Throat: Oropharynx is clear and moist. No oropharyngeal exudate.  Eyes: Conjunctivae and EOM are normal. Pupils are equal, round, and reactive to light. Right eye exhibits no discharge. Left eye exhibits no discharge. No scleral icterus.  Neck: Normal range of motion. No JVD present. No thyromegaly present.  Cardiovascular: Normal rate, regular rhythm, normal heart sounds and intact distal pulses. Exam reveals no gallop and no friction rub.  No murmur heard. Pulmonary/Chest: Effort normal and breath sounds normal. No respiratory distress. He has no wheezes. He has no rales.  Abdominal: Soft. Bowel sounds are normal. He exhibits no distension. There is no tenderness. There is no rebound and no guarding.  Genitourinary:  Genitourinary Comments: Incontinent for both bowel and bladder  Musculoskeletal: He exhibits no edema.  Bilateral lower extremities weakness.Transfers with assistance to wheelchair.   Lymphadenopathy:    He has no cervical adenopathy.  Neurological: He is oriented to person, place, and time. Coordination normal.  Skin: Skin is warm and dry. No rash noted.  Sacral  area progressive healing ulcer unstageable.    Psychiatric: He has a normal mood and affect.    Labs reviewed: Recent Labs    01/08/17 1356 01/10/17 1528 01/11/17 0532  NA 140 140 141  K 4.1 4.8 4.1  CL 111 112* 111  CO2 23 21* 25  GLUCOSE 95 87 92  BUN 35* 30* 28*  CREATININE 1.26* 1.13 1.07  CALCIUM 8.7* 8.8* 8.3*   Recent Labs    05/13/16 2021 11/15/16 0800 01/10/17 1528 01/11/17 0532  AST 20 19 18 16   ALT 15* 21 17 15*  ALKPHOS 38  --  38 38  BILITOT 0.4 0.6 0.7 0.7  PROT 5.8* 6.8 6.2* 5.3*  ALBUMIN 3.2*  --  3.2* 2.8*   Recent Labs    05/13/16 2021  01/08/17 1356 01/10/17 1528 01/11/17 0532  WBC 4.7   < > 6.6 6.9 6.3  NEUTROABS 3.3  --  4.6 5.3  --   HGB 13.1   < > 12.4* 12.4* 11.9*  HCT 40.2   < > 38.6* 39.3 38.1*  MCV 92.6   < > 96.5 97.3 96.2  PLT 128*   < > 141* 152 143*   < > = values in this interval not displayed.   Lab Results  Component Value Date   TSH 4.866 (H) 01/11/2017   Lab Results  Component Value Date   HGBA1C 5.3 07/31/2012   Lab Results  Component Value Date   CHOL 182 11/15/2016   HDL 68 11/15/2016   LDLCALC 95 02/02/2016   LDLDIRECT 132.5 09/08/2012   TRIG 99 11/15/2016   CHOLHDL 2.7 11/15/2016    Significant Diagnostic Results in last 30 days:  Dg Chest 2 View  Result Date: 01/11/2017 CLINICAL DATA:  Weakness for 1 day EXAM: CHEST  2 VIEW COMPARISON:  06/08/2016 FINDINGS: The heart size and mediastinal contours are within normal limits. Both lungs are  clear. The visualized skeletal structures are unremarkable. : Inter positioning beneath the right diaphragm is felt to contribute to lucency beneath the right diaphragm. IMPRESSION: No active cardiopulmonary disease. Electronically Signed   By: Donavan Foil M.D.   On: 01/11/2017 00:29   Ct Head Wo Contrast  Result Date: 01/10/2017 CLINICAL DATA:  Fall on Thursday.  Weakness. EXAM: CT HEAD WITHOUT CONTRAST TECHNIQUE: Contiguous axial images were obtained from the base of  the skull through the vertex without intravenous contrast. COMPARISON:  05/13/2016 FINDINGS: Brain: There is atrophy and chronic small vessel disease changes. No acute intracranial abnormality. Specifically, no hemorrhage, hydrocephalus, mass lesion, acute infarction, or significant intracranial injury. Vascular: No hyperdense vessel or unexpected calcification. Skull: No acute calvarial abnormality. Sinuses/Orbits: Visualized paranasal sinuses and mastoids clear. Orbital soft tissues unremarkable. Other: None IMPRESSION: No acute intracranial abnormality. Atrophy, chronic microvascular disease. Electronically Signed   By: Rolm Baptise M.D.   On: 01/10/2017 16:55   Ct Pelvis Wo Contrast  Result Date: 01/10/2017 CLINICAL DATA:  83 year old male fell yesterday onto left hip. Left hip pain. Surgery 15 years ago. Subsequent encounter. EXAM: CT PELVIS WITHOUT CONTRAST TECHNIQUE: Multidetector CT imaging of the pelvis was performed following the standard protocol without intravenous contrast. COMPARISON:  01/08/2017 plain film exam. FINDINGS: Urinary Tract: Evaluation of bladder limited by lack of contrast and streak artifact. Bowel: Evaluation of bowel limited by lack of contrast and streak artifact Vascular/Lymphatic: Calcified aorta and iliac arteries. No adenopathy. Reproductive:  Evaluation prostate gland limited by streak artifact. Other:  Hydroceles. Musculoskeletal: Post left hip replacement. Nondisplaced fracture left acetabulum/ilium medial to the left acetabular screws. Marked right hip joint degenerative changes. Prominent degenerative changes lower lumbar spine. IMPRESSION: Post left hip replacement. Nondisplaced fracture left acetabulum/ilium medial to the left acetabular screws (series 4, images 29 through 33). Marked right hip joint degenerative changes. Prominent degenerative changes lower lumbar spine. Electronically Signed   By: Genia Del M.D.   On: 01/10/2017 17:00   Mr Cervical Spine Wo  Contrast  Result Date: 01/10/2017 CLINICAL DATA:  83 y/o  M; status post fall and incontinence. EXAM: MRI CERVICAL SPINE WITHOUT CONTRAST TECHNIQUE: Multiplanar, multisequence MR imaging of the cervical spine was performed. No intravenous contrast was administered. COMPARISON:  11/16/2016 MRI of the cervical spine. 11/10/2011 CT of the cervical spine. FINDINGS: Alignment: Stable straightening of cervical lordosis. Stable grade 1 C7-T1 anterolisthesis. Vertebrae: No fracture, evidence of discitis, or bone lesion. Vertebral body heights are preserved. No soft tissue edema or prevertebral fluid collection to suggest ligamentous injury. 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 No new abnormal cord signal. No evidence for cord hemorrhage. Posterior Fossa, vertebral arteries, paraspinal tissues: Negative. Disc levels:  Congenital cervical spinal canal stenosis. C2-3: Stable disc osteophyte complex eccentric to the left and right greater than left uncovertebral and facet hypertrophy. Severe right and mild left foraminal stenosis. Moderate canal stenosis with left anterior cord impingement and flattening. C3-4: Stable disc osteophyte complex and extensive uncovertebral and facet hypertrophy. Severe bilateral foraminal stenosis. Moderate canal stenosis with cord impingement and flattening. C4-5: Stable disc osteophyte complex, central protrusion, and left-greater-than-right uncovertebral/ facet hypertrophy. Moderate right and severe left foraminal stenosis. Severe canal stenosis with cord flattening. C5-6: Stable disc osteophyte complex with bilateral uncovertebral and facet hypertrophy. Moderate to severe bilateral foraminal stenosis. Severe canal stenosis with cord flattening. C6-7: Stable disc osteophyte complex with bilateral uncovertebral and facet hypertrophy greater  on the left. Moderate right and severe left foraminal  stenosis. Moderate canal stenosis with anterior cord impingement and flattening. C7-T1: Stable grade 1 anterolisthesis with uncovered disc bulge and mild bilateral uncovertebral and facet hypertrophy. Mild bilateral foraminal and canal stenosis. IMPRESSION: 1. No acute osseous abnormality or findings of ligamentous injury. 2. Stable right lateral C4-5 cord signal likely representing chronic compressive myelopathy. 3. 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 4. Stable severe C4-5 and C5-6 canal stenosis. Moderate C2-3, C3-4, C6-7 canal stenosis. 5. Stable multiple levels of high-grade foraminal stenosis as above. Electronically Signed   By: Kristine Garbe M.D.   On: 01/10/2017 21:47   Dg Hip Unilat With Pelvis 2-3 Views Left  Result Date: 01/08/2017 CLINICAL DATA:  [place] yesterday onto left hip. Complains of pain since fall. Left hip arthroplasty. EXAM: DG HIP (WITH OR WITHOUT PELVIS) 2-3V LEFT COMPARISON:  08/09/2016 FINDINGS: Left hip arthroplasty is located. The entire left femoral stem is visualized without a periprosthetic fracture. Pelvic bony ring is intact. Again noted is severe joint space narrowing along the superior right hip joint. Degenerative disc and endplate changes in the lower lumbar spine. IMPRESSION: No acute abnormality to the pelvis or left hip. Severe osteoarthritic changes in the right hip. Electronically Signed   By: Markus Daft M.D.   On: 01/08/2017 13:57    Assessment/Plan Pressure injury of sacral region, unstageable Afebrile.Sacral area progressive healing ulcer unstageable.Request Ketoconazole 2% and Fluticasone 0.05% cream to be applied to buttocks.He states used this cream in the past. Continue on Zinc oxide cream with peri-care to prevent pressure ulcer recurrence.Encourage to avoid lying in supine position to alleviate pressure.continue on current protein supplements and MVI.    Left hip fracture Left buttock  hematoma without any signs of infection, increase in size or tenderness.Hip Pain under control with current pain regimen.continue TDWB to LLE and Physical therapy as recommended by Orthopedic. Continue to follow up with Orthopedic as directed.   Family/ staff Communication: Reviewed plan of care with patient and facility Nurse.   Labs/tests ordered: None   Gaither Biehn C Theodosia Bahena, NP

## 2017-01-27 ENCOUNTER — Telehealth: Payer: Self-pay | Admitting: *Deleted

## 2017-01-27 NOTE — Telephone Encounter (Signed)
Sounds good to me

## 2017-01-27 NOTE — Telephone Encounter (Signed)
Patient is touch down weight bearing for now until further follow up with orthopedic. This is something therapy team is aware about. Therapy team submits their care plan for the patient to his insurance company. If they have any question regarding therapy, insurance team contacts the therapy team.

## 2017-01-27 NOTE — Telephone Encounter (Signed)
Patient's wife walked into the clinic. She was saying that patient's insurance was starting to question his therapy based on how much weight he can put on his leg. She said the therapists need to know this to avoid non-payment and she wanted to know so she could inform them. I advised that they would get in touch with MD or NP when they needed this info and things would be updated for insurance purposes. She verbalized understanding and asked that this be taken care of today.

## 2017-01-31 ENCOUNTER — Non-Acute Institutional Stay (SKILLED_NURSING_FACILITY): Payer: Medicare Other | Admitting: Family

## 2017-01-31 DIAGNOSIS — L89152 Pressure ulcer of sacral region, stage 2: Secondary | ICD-10-CM

## 2017-01-31 NOTE — Progress Notes (Addendum)
Location:  [facility] Room Number: 34 Place of Service:  SNF [phone]) Provider: Dinah [name] FNP-C  [name], MD  Patient Care Team: [name], MD as PCP - General (Internal Medicine) [name], MD as Consulting Physician (Urology) [name], MD as Consulting Physician (Neurology) [name], MD as Consulting Physician (Dermatology) [name], MD (Inactive) as Consulting Physician (Gastroenterology) [name], MD as Consulting Physician (Orthopedic Surgery) [name], MD as Consulting Physician (Cardiology) [name], MD as Consulting Physician (Ophthalmology) [name], MD as Referring Physician (Psychiatry) [name], MD as Consulting Physician (General Surgery) [name], MD as Consulting Physician (Orthopedic Surgery)  Extended Emergency Contact Information Primary Emergency Contact: Waynick,[name] Address: [address]          [place], [place] [address] [place] Phone: [phone] Relation: Spouse  Code Status:  DNR Goals of care: Advanced Directive information Advanced Directives 01/26/2017  Does Patient Have a Medical Advance Directive? Yes  Type of Paramedic of [place];Living will;Out of facility DNR (pink MOST or yellow form)  Does patient want to make changes to medical advance directive? -  Copy of [place] in Chart? Yes  Pre-existing out of facility DNR order (yellow form or pink MOST form) Pink MOST form placed in chart (order not valid for inpatient use);Yellow form placed in chart (order not valid for inpatient use)     Chief Complaint  Patient presents with  . Acute Visit    worsening sacral pressure     HPI:  Pt is a 83 y.o. male seen today at [facility] for an acute visit for evaluation of worsening sacral pressure.He is seen in his room today with facility Nurse present.  Nurse reports patient's pressure ulcer seems to be worsening. Ulcer previous healed now has opened up again. He denies any fever or chills.    Past Medical History:  Diagnosis Date  . Acute urinary retention 01/28/2013  . AKI (acute kidney injury) ([place]) 01/26/2013  . Altered mental status 09/09/2013  . Anemia    B12 deficiency  . ANXIETY 06/07/2007   Qualifier: Diagnosis of  By: Talbert Cage CMA ([place]), June    . ARTHRITIS 06/07/2007   Qualifier: Diagnosis of  By: Talbert Cage CMA ([place]), June    . Balance problems 04/05/2011  . Benign essential tremor   . Benign fibroma of prostate 10/11/2011  . Bilateral inguinal hernia 10/09/2012  . Bilateral leg edema 09/13/2012  . Bladder retention 02/05/2013  . Bradycardia, sinus 08/02/2012  . CAROTID ARTERY DISEASE 03/31/2007   Qualifier: Diagnosis of  By: Linna Darner MD, Gwyndolyn Saxon    . Carotid stenosis    s/p L CEA  . Cataract, nuclear 03/21/2014  . 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 Cervical spinal stenosis   . Cervical stenosis of spine   . Critical lower limb ischemia   . Degeneration macular 11/02/2011  . Depression    Dr Albertine Patricia  . Difficulty in walking(719.7) 04/05/2011  . ESOPHAGITIS 08/06/2002   Qualifier: Diagnosis of  By: Talbert Cage CMA ([place]), June    . Essential and other specified forms of tremor 04/18/2013  . Fall at home 01/06/2017   left hip pain  . Falls   . Family history of colon cancer 07/24/2012  . Fecal impaction ([place]) 10/11/2012  . Femoral hernia, bilateral s/p lap repair 01/16/2013 01/16/2013  . Glaucoma, compensated  03/21/2014  . H/O cardiovascular stress test    a. 02/2003 -> no ischemia/infarct.  Marland Kitchen HAMMER TOE 04/23/2010   Qualifier: Diagnosis of  By: Linna Darner MD, Gwyndolyn Saxon    . Hematoma of leg   . Hip hematoma, right 09/09/2013  . History of shingles 2009  . HTN (hypertension) 08/22/2012  . Hx of echocardiogram    Echocardiogram 08/01/12: Mild LVH, EF 55-60%, normal wall motion.  . Hydrocele 10/11/2011  . Hyperlipidemia   . 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 Hypothyroidism   . Macular degeneration disease   . Macular edema   . Migraines    Dr Jannifer Franklin  . MORTON'S NEUROMA, LEFT 08/22/2007   Qualifier: Diagnosis of  By: Linna Darner MD, Gwyndolyn Saxon    . PAD (peripheral artery disease) ([place])   . Peripheral neuropathy   . Personal history of colonic polyps 01/05/2013   2014 2 mm adenomatous polyp   . PREMATURE ATRIAL CONTRACTIONS 03/14/2006   Qualifier: Diagnosis of  By: Linna Darner MD, Gwendolyn Lima 12/16/2011  . SPINAL STENOSIS 03/14/2006   Qualifier: Diagnosis of  By: Linna Darner MD, William   Cervical spine    . Testosterone deficiency    Dr Lawerance Bach, [facility]  . Unspecified vitamin D deficiency 08/06/2012  . Urge incontinence 10/09/2012  . Urinary tract infection, site not specified 02/13/2013  . Weakness 08/02/2012   Past Surgical History:  Procedure Laterality Date  . APPENDECTOMY  1941  . BREAST CYST EXCISION Left 01/16/2013   Procedure: MASS EXCISION AXILLA;  Surgeon: Adin Hector, MD;  Location: [facility];  Service: General;  Laterality: Left;  . CAROTID ENDARTERECTOMY  2005    L  . CATARACT EXTRACTION Right   . COLONOSCOPY  2014   Dr. Deatra Ina  . EYE SURGERY Right 02/2012   retina  . INGUINAL HERNIA REPAIR Bilateral 01/16/2013   Procedure: LAPAROSCOPIC BILATERAL INGUINAL DIRECT AND INDIRECT, FEMORAL, AND OBTURATOR HERNIAS;  Surgeon: Adin Hector, MD;  Location: [facility];  Service: General;  Laterality: Bilateral;  . INSERTION OF MESH N/A 01/16/2013   Procedure: INSERTION OF MESH;  Surgeon: Adin Hector, MD;  Location: [facility];  Service: General;  Laterality: N/A;  . LUMBAR LAMINECTOMY    . TONSILLECTOMY AND ADENOIDECTOMY    . TOTAL HIP ARTHROPLASTY  2005   L    Allergies  Allergen Reactions  . Latex Rash  . Tape Rash    Paper tape is ok to use     Outpatient Encounter Medications as of 01/31/2017  Medication Sig  . acetaminophen (TYLENOL) 325 MG tablet Take 2 tablets (650 mg total) by mouth every 6 (six) hours  as needed for mild pain (or Fever >/= 101).  Marland Kitchen acetaminophen (TYLENOL) 500 MG tablet Take 1,000 mg by mouth 2 (two) times daily.  Marland Kitchen aspirin 81 MG tablet Take 81 mg by mouth every morning.   Marland Kitchen atorvastatin (LIPITOR) 20 MG tablet Take one tablet by mouth once daily for cholesterol  . B Complex-C (B-COMPLEX WITH VITAMIN C) tablet Take 1 tablet by mouth daily.  . bimatoprost (LUMIGAN) 0.01 % SOLN Place 1 drop into both eyes at bedtime.  Marland Kitchen buPROPion (WELLBUTRIN XL) 150 MG 24 hr tablet Take 150 mg by mouth daily.   . Cholecalciferol (VITAMIN D3) 1000 units CAPS Take 1,000 Units by mouth daily.  . clonazePAM (KLONOPIN) 0.5 MG tablet Take 0.5 tablets (0.25 mg total) by mouth at bedtime as needed for anxiety (sleep). Take one daily for anxiety  .  fluticasone (CUTIVATE) 0.05 % cream Apply 1 application topically 2 (two) times daily as needed (irritation).   Marland Kitchen HYDROcodone-acetaminophen (NORCO/VICODIN) 5-325 MG tablet Take 1 tablet by mouth every 4 (four) hours as needed for moderate pain.  Javier Docker Oil 500 MG CAPS Take 500 mg by mouth every morning.   Marland Kitchen levothyroxine (SYNTHROID) 50 MCG tablet Take 1/2 tablet by mouth on Sunday mornings and Take one tablet by mouth every other morning for thryoid.  Marland Kitchen linaclotide (LINZESS) 145 MCG CAPS capsule Take 1 capsule (145 mcg total) by mouth daily before breakfast.  . mirtazapine (REMERON) 30 MG tablet Take 1 tablet (30 mg total) by mouth at bedtime.  . Multiple Vitamins-Minerals (ICAPS PO) Take 1 tablet by mouth daily.  . Nutritional Supplements (BOOST PO) Take 1 Can by mouth daily.  . polyethylene glycol (MIRALAX / GLYCOLAX) packet Take 17 g by mouth daily as needed for mild constipation.  . sertraline (ZOLOFT) 100 MG tablet Take 200 mg by mouth every morning.   . tamsulosin (FLOMAX) 0.4 MG CAPS capsule TAKE 1 CAPSULE (0.4 MG TOTAL) BY MOUTH DAILY.  Marland Kitchen topiramate (TOPAMAX) 25 MG tablet TAKE 1 TABLET BY MOUTH EVERY MORNING AND 2 TABLETS BY MOUTH EVERY EVENING  .  ketoconazole (NIZORAL) 2 % cream Apply 1 application topically daily as needed for irritation.    No facility-administered encounter medications on file as of 01/31/2017.     Review of Systems  Constitutional: Negative for appetite change, chills and fever.  Respiratory: Negative for cough, chest tightness, shortness of breath and wheezing.   Cardiovascular: Negative for chest pain, palpitations and leg swelling.  Gastrointestinal: Negative for abdominal distention, abdominal pain, constipation, diarrhea, nausea and vomiting.  Skin: Negative for color change, pallor and rash.       Worsening sacral pressure ulcer  Psychiatric/Behavioral: Negative for agitation, confusion and sleep disturbance. The patient is not nervous/anxious.     Immunization History  Administered Date(s) Administered  . DTaP 11/05/2005  . Hepatitis B 21-Jul-1932  . Influenza Split 11/12/2011, 11/29/2013  . Influenza Whole 10/30/2007, 11/12/2008, 11/26/2009  . Influenza, High Dose Seasonal PF 12/31/2015, 11/24/2016  . PPD Test 06/23/2011  . Pneumococcal Conjugate-13 01/14/2016  . Pneumococcal Polysaccharide-23 02/16/1999  . Varicella 11/09/2005  . Zoster 08/30/2006   Pertinent  Health Maintenance Due  Topic Date Due  . INFLUENZA VACCINE  Completed  . PNA vac Low Risk Adult  Completed   Fall Risk  10/27/2016 06/09/2016 02/03/2016 10/28/2015 01/16/2015  Falls in the past year? Yes Yes Yes Yes Yes  Number falls in past yr: 2 or more 2 or more 2 or more 2 or more 2 or more  Comment - - 2 falls in last 2 months - -  Injury with Fall? Yes No Yes No (No Data)  Comment L hip - scraps and bruises - head  Risk Factor Category  - - - - -  Risk for fall due to : - - - Impaired balance/gait -  Follow up - - - - -    Vitals:   01/31/17 1515  BP: (!) 146/70  Pulse: (!) 53  Resp: 18  Temp: (!) 97.3 F (36.3 C)  SpO2: 96%  Weight: 143 lb 12.8 oz (65.2 kg)  Height: 6' (1.829 m)   Body mass index is 19.5  kg/m. Physical Exam  Constitutional: He is oriented to person, place, and time.  Thin tall elderly in no acute distress  HENT:  Head: Normocephalic.  Mouth/Throat: Oropharynx is  clear and moist. No oropharyngeal exudate.  Eyes: Conjunctivae and EOM are normal. Pupils are equal, round, and reactive to light. Right eye exhibits no discharge. Left eye exhibits no discharge. No scleral icterus.  Neck: Normal range of motion. No JVD present. No thyromegaly present.  Cardiovascular: Normal rate, regular rhythm, normal heart sounds and intact distal pulses. Exam reveals no gallop and no friction rub.  No murmur heard. Pulmonary/Chest: Effort normal and breath sounds normal. No respiratory distress. He has no wheezes. He has no rales.  Abdominal: Soft. Bowel sounds are normal. He exhibits no distension. There is no tenderness. There is no rebound and no guarding.  Musculoskeletal: He exhibits no edema or tenderness.  Wheelchair bound.   Lymphadenopathy:    He has no cervical adenopathy.  Neurological: He is oriented to person, place, and time.  Skin: Skin is warm and dry. No rash noted. No erythema.  Stage 2 pressure ulcer to sacral area surrounding skin tissue whitish maceration.Old dressing saturated with urine.   Psychiatric: He has a normal mood and affect.   Labs reviewed: Recent Labs    01/08/17 1356 01/10/17 1528 01/11/17 0532  NA 140 140 141  K 4.1 4.8 4.1  CL 111 112* 111  CO2 23 21* 25  GLUCOSE 95 87 92  BUN 35* 30* 28*  CREATININE 1.26* 1.13 1.07  CALCIUM 8.7* 8.8* 8.3*   Recent Labs    05/13/16 2021 11/15/16 0800 01/10/17 1528 01/11/17 0532  AST 20 19 18 16   ALT 15* 21 17 15*  ALKPHOS 38  --  38 38  BILITOT 0.4 0.6 0.7 0.7  PROT 5.8* 6.8 6.2* 5.3*  ALBUMIN 3.2*  --  3.2* 2.8*   Recent Labs    05/13/16 2021  01/08/17 1356 01/10/17 1528 01/11/17 0532  WBC 4.7   < > 6.6 6.9 6.3  NEUTROABS 3.3  --  4.6 5.3  --   HGB 13.1   < > 12.4* 12.4* 11.9*  HCT 40.2    < > 38.6* 39.3 38.1*  MCV 92.6   < > 96.5 97.3 96.2  PLT 128*   < > 141* 152 143*   < > = values in this interval not displayed.   Lab Results  Component Value Date   TSH 4.866 (H) 01/11/2017   Lab Results  Component Value Date   HGBA1C 5.3 07/31/2012   Lab Results  Component Value Date   CHOL 182 11/15/2016   HDL 68 11/15/2016   LDLCALC 95 02/02/2016   LDLDIRECT 132.5 09/08/2012   TRIG 99 11/15/2016   CHOLHDL 2.7 11/15/2016    Significant Diagnostic Results in last 30 days:  Dg Chest 2 View  Result Date: 01/11/2017 CLINICAL DATA:  Weakness for 1 day EXAM: CHEST  2 VIEW COMPARISON:  06/08/2016 FINDINGS: The heart size and mediastinal contours are within normal limits. Both lungs are clear. The visualized skeletal structures are unremarkable. : Inter positioning beneath the right diaphragm is felt to contribute to lucency beneath the right diaphragm. IMPRESSION: No active cardiopulmonary disease. Electronically Signed   By: Donavan Foil M.D.   On: 01/11/2017 00:29   Ct Head Wo Contrast  Result Date: 01/10/2017 CLINICAL DATA:  Fall on Thursday.  Weakness. EXAM: CT HEAD WITHOUT CONTRAST TECHNIQUE: Contiguous axial images were obtained from the base of the skull through the vertex without intravenous contrast. COMPARISON:  05/13/2016 FINDINGS: Brain: There is atrophy and chronic small vessel disease changes. No acute intracranial abnormality. Specifically, no hemorrhage, hydrocephalus,  mass lesion, acute infarction, or significant intracranial injury. Vascular: No hyperdense vessel or unexpected calcification. Skull: No acute calvarial abnormality. Sinuses/Orbits: Visualized paranasal sinuses and mastoids clear. Orbital soft tissues unremarkable. Other: None IMPRESSION: No acute intracranial abnormality. Atrophy, chronic microvascular disease. Electronically Signed   By: Rolm Baptise M.D.   On: 01/10/2017 16:55   Ct Pelvis Wo Contrast  Result Date: 01/10/2017 CLINICAL DATA:   83 year old male fell yesterday onto left hip. Left hip pain. Surgery 15 years ago. Subsequent encounter. EXAM: CT PELVIS WITHOUT CONTRAST TECHNIQUE: Multidetector CT imaging of the pelvis was performed following the standard protocol without intravenous contrast. COMPARISON:  01/08/2017 plain film exam. FINDINGS: Urinary Tract: Evaluation of bladder limited by lack of contrast and streak artifact. Bowel: Evaluation of bowel limited by lack of contrast and streak artifact Vascular/Lymphatic: Calcified aorta and iliac arteries. No adenopathy. Reproductive:  Evaluation prostate gland limited by streak artifact. Other:  Hydroceles. Musculoskeletal: Post left hip replacement. Nondisplaced fracture left acetabulum/ilium medial to the left acetabular screws. Marked right hip joint degenerative changes. Prominent degenerative changes lower lumbar spine. IMPRESSION: Post left hip replacement. Nondisplaced fracture left acetabulum/ilium medial to the left acetabular screws (series 4, images 29 through 33). Marked right hip joint degenerative changes. Prominent degenerative changes lower lumbar spine. Electronically Signed   By: Genia Del M.D.   On: 01/10/2017 17:00   Mr Cervical Spine Wo Contrast  Result Date: 01/10/2017 CLINICAL DATA:  83 y/o  M; status post fall and incontinence. EXAM: MRI CERVICAL SPINE WITHOUT CONTRAST TECHNIQUE: Multiplanar, multisequence MR imaging of the cervical spine was performed. No intravenous contrast was administered. COMPARISON:  11/16/2016 MRI of the cervical spine. 11/10/2011 CT of the cervical spine. FINDINGS: Alignment: Stable straightening of cervical lordosis. Stable grade 1 C7-T1 anterolisthesis. Vertebrae: No fracture, evidence of discitis, or bone lesion. Vertebral body heights are preserved. No soft tissue edema or prevertebral fluid collection to suggest ligamentous injury. Cord: Increased signal within the right lateral cord at the C4-5 level is stable and likely  represents chronic compressive myelopathy. No new abnormal cord signal. No evidence for cord hemorrhage. Posterior Fossa, vertebral arteries, paraspinal tissues: Negative. Disc levels:  Congenital cervical spinal canal stenosis. C2-3: Stable disc osteophyte complex eccentric to the left and right greater than left uncovertebral and facet hypertrophy. Severe right and mild left foraminal stenosis. Moderate canal stenosis with left anterior cord impingement and flattening. C3-4: Stable disc osteophyte complex and extensive uncovertebral and facet hypertrophy. Severe bilateral foraminal stenosis. Moderate canal stenosis with cord impingement and flattening. C4-5: Stable disc osteophyte complex, central protrusion, and left-greater-than-right uncovertebral/ facet hypertrophy. Moderate right and severe left foraminal stenosis. Severe canal stenosis with cord flattening. C5-6: Stable disc osteophyte complex with bilateral uncovertebral and facet hypertrophy. Moderate to severe bilateral foraminal stenosis. Severe canal stenosis with cord flattening. C6-7: Stable disc osteophyte complex with bilateral uncovertebral and facet hypertrophy greater on the left. Moderate right and severe left foraminal stenosis. Moderate canal stenosis with anterior cord impingement and flattening. C7-T1: Stable grade 1 anterolisthesis with uncovered disc bulge and mild bilateral uncovertebral and facet hypertrophy. Mild bilateral foraminal and canal stenosis. IMPRESSION: 1. No acute osseous abnormality or findings of ligamentous injury. 2. Stable right lateral C4-5 cord signal likely representing chronic compressive myelopathy. 3. 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 4. Stable severe C4-5 and C5-6 canal stenosis. Moderate C2-3, C3-4, C6-7 canal stenosis. 5. Stable multiple levels of high-grade foraminal stenosis as above. Electronically Signed   By: Mia Creek  [place] M.D.   On:  01/10/2017 21:47   Dg Hip Unilat With Pelvis 2-3 Views Left  Result Date: 01/08/2017 CLINICAL DATA:  [place] yesterday onto left hip. Complains of pain since fall. Left hip arthroplasty. EXAM: DG HIP (WITH OR WITHOUT PELVIS) 2-3V LEFT COMPARISON:  08/09/2016 FINDINGS: Left hip arthroplasty is located. The entire left femoral stem is visualized without a periprosthetic fracture. Pelvic bony ring is intact. Again noted is severe joint space narrowing along the superior right hip joint. Degenerative disc and endplate changes in the lower lumbar spine. IMPRESSION: No acute abnormality to the pelvis or left hip. Severe osteoarthritic changes in the right hip. Electronically Signed   By: Markus Daft M.D.   On: 01/08/2017 13:57    Assessment/Plan Pressure ulcer of sacral region, stage 2 Afebrile.Stage 2 sacral ulcer with surrounding skin tissue whitish maceration.Old dressing saturated with urine.discontinue foam dressing.apply zinc Oxide ointment three times daily and as needed with peri-care. Provide alternating air mattress to prevent further skin breakdown.wheelchair gel cushion in place.continue on Nutrition supplements.Apply condom catheter to prevent pressure ulcer maceration from urine and prevent infections.   Family/ staff Communication: Reviewed plan of care with patient and facility Nurse.  Labs/tests ordered: None   Dinah C [name], NP

## 2017-02-03 NOTE — Progress Notes (Signed)
   01/11/17 1305  Acute Rehab PT Goals  Patient Stated Goal ST-SNF at [facility]  PT Goal Formulation With patient  Time For Goal Achievement 01/25/17  Potential to Achieve Goals Fair  PT Time Calculation  PT Start Time (ACUTE ONLY) 1150  PT Stop Time (ACUTE ONLY) 1233  PT Time Calculation (min) (ACUTE ONLY) 43 min  PT G-Codes **NOT FOR INPATIENT CLASS**  Functional Assessment Tool Used AM-PAC 6 Clicks Basic Mobility  Functional Limitation Mobility: Walking and moving around  Mobility: Walking and Moving Around Current Status (H5747) CN  Mobility: Walking and Moving Around Goal Status (B4037) CL  PT General Charges  $$ ACUTE PT VISIT 1 Visit  PT Evaluation  $PT Eval Moderate Complexity 1 Mod  PT Treatments  $Therapeutic Activity 23-37 mins  Jose Leach PT 02/03/2017  [phone]

## 2017-02-10 ENCOUNTER — Other Ambulatory Visit: Payer: Self-pay | Admitting: Orthopedic Surgery

## 2017-02-10 ENCOUNTER — Ambulatory Visit
Admission: RE | Admit: 2017-02-10 | Discharge: 2017-02-10 | Disposition: A | Discharge status: Home / Self Care | Payer: Medicare Other | Source: Ambulatory Visit | Attending: Orthopedic Surgery | Admitting: Orthopedic Surgery

## 2017-02-10 DIAGNOSIS — S32402A Unspecified fracture of left acetabulum, initial encounter for closed fracture: Secondary | ICD-10-CM

## 2017-02-11 ENCOUNTER — Non-Acute Institutional Stay (SKILLED_NURSING_FACILITY): Payer: Medicare Other | Admitting: Family

## 2017-02-11 ENCOUNTER — Encounter: Payer: Self-pay | Admitting: Family

## 2017-02-11 DIAGNOSIS — E559 Vitamin D deficiency, unspecified: Secondary | ICD-10-CM

## 2017-02-11 DIAGNOSIS — F341 Dysthymic disorder: Secondary | ICD-10-CM | POA: Diagnosis not present

## 2017-02-11 DIAGNOSIS — E039 Hypothyroidism, unspecified: Secondary | ICD-10-CM | POA: Diagnosis not present

## 2017-02-11 DIAGNOSIS — E782 Mixed hyperlipidemia: Secondary | ICD-10-CM | POA: Diagnosis not present

## 2017-02-11 DIAGNOSIS — R030 Elevated blood-pressure reading, without diagnosis of hypertension: Secondary | ICD-10-CM | POA: Diagnosis not present

## 2017-02-11 DIAGNOSIS — R441 Visual hallucinations: Secondary | ICD-10-CM | POA: Diagnosis not present

## 2017-02-11 DIAGNOSIS — F329 Major depressive disorder, single episode, unspecified: Secondary | ICD-10-CM

## 2017-02-11 NOTE — Progress Notes (Addendum)
Location:  [facility] Room Number: 34 Place of Service:  SNF [phone]) Provider: Chrystal Zeimet FNP-C   [name], MD  Patient Care Team: [name], MD as PCP - General (Internal Medicine) [name], MD as Consulting Physician (Urology) [name], MD as Consulting Physician (Neurology) [name], MD as Consulting Physician (Dermatology) [name], MD (Inactive) as Consulting Physician (Gastroenterology) [name], MD as Consulting Physician (Orthopedic Surgery) [name], MD as Consulting Physician (Cardiology) [name], MD as Consulting Physician (Ophthalmology) [name], MD as Referring Physician (Psychiatry) [name], MD as Consulting Physician (General Surgery) [name], MD as Consulting Physician (Orthopedic Surgery)  Extended Emergency Contact Information Primary Emergency Contact: Fobes,[name] Address: [address]          [place], [place] [address] [place] Phone: [phone] Relation: Spouse  Code Status: DNR Goals of care: Advanced Directive information Advanced Directives 02/11/2017  Does Patient Have a Medical Advance Directive? Yes  Type of Paramedic of [place];Out of facility DNR (pink MOST or yellow form);Living will  Does patient want to make changes to medical advance directive? -  Copy of [place] in Chart? Yes  Pre-existing out of facility DNR order (yellow form or pink MOST form) Pink MOST form placed in chart (order not valid for inpatient use);Yellow form placed in chart (order not valid for inpatient use)     Chief Complaint  Patient presents with  . Medical Management of Chronic Issues    Routine; also having visual hallucinations    HPI:  Pt is a 83 y.o. male seen today [facility] for medical management of chronic diseases.He has a medical history of  HTN,hyperlipidemia,hypothyroidism,BPH,CKD stage 3,major Depression among other conditions.He is seen in his room today with facility Nurse present at bedside.He has had progressive weight loss 01/16/2017 wt 146 lbs;01/19/2017 wt 145.2 lbs; 01/26/2017 wt 143.8 lbs;02/02/2017 wt 140 lbs. Facility Registered dietician continues to follow up.His sacral ulcer continue to be managed by facility Nurse progressive healing reported. No recent fall episodes reported. He was seen 02/09/2017 by Orthopedic specialist Dr. Lyla Glassing for follow up left hip/pelvic nondisplaced fracture.Patient to continue with TDWB to LLE with walker then follow up with Ortho in 03/09/2017 at 2:15 Pm. Also ordered X-ray of Left hip/pelvic done at [facility].Facility Nurse reports patient's wife stated patient was hallucinating seeing books on a shelf in his room though aware that there's no book shelf.Patient's wife states patient follows up with Psychiatry Dr. [name]. He denies any more hallucination since then.Blood pressure log reviewed occasional elevated SBP in the 160's-170's noted overall SBP readings in the 110's-150's.   Past Medical History:  Diagnosis Date  . Acute urinary retention 01/28/2013  . AKI (acute kidney injury) ([place]) 01/26/2013  . Altered mental status 09/09/2013  . Anemia    B12 deficiency  . ANXIETY 06/07/2007   Qualifier: Diagnosis of  By: Talbert Cage CMA ([place]), June    . ARTHRITIS 06/07/2007   Qualifier: Diagnosis of  By: Talbert Cage CMA ([facility] [place]), June    . Balance problems 04/05/2011  . Benign essential tremor   . Benign fibroma of prostate 10/11/2011  . Bilateral inguinal hernia 10/09/2012  . Bilateral leg edema 09/13/2012  . Bladder retention 02/05/2013  . Bradycardia, sinus 08/02/2012  . CAROTID ARTERY DISEASE 03/31/2007   Qualifier: Diagnosis of  By: Linna Darner MD,  William    . Carotid stenosis    s/p L CEA  . Cataract, nuclear 03/21/2014  . 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 Cervical spinal stenosis     . Cervical stenosis of spine   . Critical lower limb ischemia   . Degeneration macular 11/02/2011  . Depression    Dr Albertine Patricia  . Difficulty in walking(719.7) 04/05/2011  . ESOPHAGITIS 08/06/2002   Qualifier: Diagnosis of  By: Talbert Cage CMA ([place]), June    . Essential and other specified forms of tremor 04/18/2013  . Fall at home 01/06/2017   left hip pain  . Falls   . Family history of colon cancer 07/24/2012  . Fecal impaction ([facility]) 10/11/2012  . Femoral hernia, bilateral s/p lap repair 01/16/2013 01/16/2013  . Glaucoma, compensated 03/21/2014  . H/O cardiovascular stress test    a. 02/2003 -> no ischemia/infarct.  Marland Kitchen HAMMER TOE 04/23/2010   Qualifier: Diagnosis of  By: Linna Darner MD, Gwyndolyn Saxon    . Hematoma of leg   . Hip hematoma, right 09/09/2013  . History of shingles 2009  . HTN (hypertension) 08/22/2012  . Hx of echocardiogram    Echocardiogram 08/01/12: Mild LVH, EF 55-60%, normal wall motion.  . Hydrocele 10/11/2011  . Hyperlipidemia   . 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 Hypothyroidism   . Macular degeneration disease   . Macular edema   . Migraines    Dr Jannifer Franklin  . MORTON'S NEUROMA, LEFT 08/22/2007   Qualifier: Diagnosis of  By: Linna Darner MD, Gwyndolyn Saxon    . PAD (peripheral artery disease) ([facility])   . Peripheral neuropathy   . Personal history of colonic polyps 01/05/2013   2014 2 mm adenomatous polyp   . PREMATURE ATRIAL CONTRACTIONS 03/14/2006   Qualifier: Diagnosis of  By: Linna Darner MD, Gwendolyn Lima 12/16/2011  . SPINAL STENOSIS 03/14/2006   Qualifier: Diagnosis of  By: Linna Darner MD, William   Cervical spine    . Testosterone deficiency    Dr Lawerance Bach, [facility]  . Unspecified vitamin D deficiency 08/06/2012  . Urge incontinence 10/09/2012  . Urinary tract infection, site not specified 02/13/2013  . Weakness 08/02/2012   Past Surgical History:  Procedure Laterality Date  . APPENDECTOMY  1941  . BREAST CYST EXCISION Left 01/16/2013   Procedure: MASS EXCISION AXILLA;  Surgeon: Adin Hector, MD;  Location: [facility];  Service: General;  Laterality: Left;  . CAROTID ENDARTERECTOMY  2005    L  . CATARACT EXTRACTION Right   . COLONOSCOPY  2014   Dr. Deatra Ina  . EYE SURGERY Right 02/2012   retina  . INGUINAL HERNIA REPAIR Bilateral 01/16/2013   Procedure: LAPAROSCOPIC BILATERAL INGUINAL DIRECT AND INDIRECT, FEMORAL, AND OBTURATOR HERNIAS;  Surgeon: Adin Hector, MD;  Location: [facility];  Service: General;  Laterality: Bilateral;  . INSERTION OF MESH N/A 01/16/2013   Procedure: INSERTION OF MESH;  Surgeon: Adin Hector, MD;  Location: [facility];  Service: General;  Laterality: N/A;  . LUMBAR LAMINECTOMY    . TONSILLECTOMY AND ADENOIDECTOMY    . TOTAL HIP ARTHROPLASTY  2005   L    Allergies  Allergen Reactions  . Latex Rash  . Tape Rash    Paper tape is ok to use     Allergies as of 02/11/2017      Reactions   Latex Rash   Tape Rash   Paper tape is ok to use      Medication List  Accurate as of 02/11/17 11:59 PM. Always use your most recent med list.          acetaminophen 500 MG tablet Commonly known as:  TYLENOL Take 1,000 mg by mouth 2 (two) times daily.   acetaminophen 325 MG tablet Commonly known as:  TYLENOL Take 2 tablets (650 mg total) by mouth every 6 (six) hours as needed for mild pain (or Fever >/= 101).   aspirin 81 MG tablet Take 81 mg by mouth every morning.   atorvastatin 20 MG tablet Commonly known as:  LIPITOR Take one tablet by mouth once daily for cholesterol   B-complex with vitamin C tablet Take 1 tablet by mouth daily.   bimatoprost 0.01 % Soln Commonly known as:  LUMIGAN Place 1 drop into both eyes at bedtime.   BOOST PO Take 1 Can by mouth daily.   buPROPion 150 MG 24 hr tablet Commonly known as:  WELLBUTRIN XL Take 150 mg by mouth daily.   clonazePAM 0.5 MG tablet Commonly known as:  KLONOPIN Take 0.5 tablets (0.25 mg total) by mouth at bedtime as needed for anxiety (sleep). Take one daily for anxiety     fluticasone 0.05 % cream Commonly known as:  CUTIVATE Apply 1 application topically 2 (two) times daily as needed (irritation).   HYDROcodone-acetaminophen 5-325 MG tablet Commonly known as:  NORCO/VICODIN Take 1 tablet by mouth every 4 (four) hours as needed for moderate pain.   ICAPS PO Take 1 tablet by mouth daily.   ketoconazole 2 % cream Commonly known as:  NIZORAL Apply 1 application topically daily as needed for irritation.   Krill Oil 500 MG Caps Take 500 mg by mouth every morning.   levothyroxine 50 MCG tablet Commonly known as:  SYNTHROID Take 1/2 tablet by mouth on Sunday mornings and Take one tablet by mouth every other morning for thryoid.   linaclotide 145 MCG Caps capsule Commonly known as:  LINZESS Take 1 capsule (145 mcg total) by mouth daily before breakfast.   mirtazapine 30 MG tablet Commonly known as:  REMERON Take 1 tablet (30 mg total) by mouth at bedtime.   polyethylene glycol packet Commonly known as:  MIRALAX / GLYCOLAX Take 17 g by mouth daily as needed for mild constipation.   sertraline 100 MG tablet Commonly known as:  ZOLOFT Take 200 mg by mouth every morning.   tamsulosin 0.4 MG Caps capsule Commonly known as:  FLOMAX TAKE 1 CAPSULE (0.4 MG TOTAL) BY MOUTH DAILY.   topiramate 25 MG tablet Commonly known as:  TOPAMAX TAKE 1 TABLET BY MOUTH EVERY MORNING AND 2 TABLETS BY MOUTH EVERY EVENING   Vitamin D3 1000 units Caps Take 1,000 Units by mouth daily.   zinc oxide 20 % ointment Apply 1 application topically as directed. Apply three times daily and as needed       Review of Systems  Constitutional: Negative for appetite change, chills, fatigue and fever.  HENT: Negative for congestion, rhinorrhea, sinus pressure, sinus pain, sneezing, sore throat and trouble swallowing.   Eyes: Negative for discharge, redness, itching and visual disturbance.  Respiratory: Negative for cough, chest tightness, shortness of breath and wheezing.    Cardiovascular: Negative for chest pain, palpitations and leg swelling.  Gastrointestinal: Negative for abdominal distention, abdominal pain, constipation, diarrhea, nausea and vomiting.       LBM 02/11/2017  Endocrine: Negative for cold intolerance, heat intolerance, polydipsia, polyphagia and polyuria.  Genitourinary: Negative for dysuria, flank pain and urgency.  Musculoskeletal: Positive  for arthralgias and gait problem.  Skin: Negative for color change, pallor and rash.  Neurological: Negative for dizziness, seizures, syncope, light-headedness and headaches.  Psychiatric/Behavioral: Negative for agitation, confusion, sleep disturbance and suicidal ideas. The patient is not nervous/anxious.        Hallucination reported by patient's wife but none since then.    Immunization History  Administered Date(s) Administered  . DTaP 11/05/2005  . Hepatitis B August 12, 1932  . Influenza Split 11/12/2011, 11/29/2013  . Influenza Whole 10/30/2007, 11/12/2008, 11/26/2009  . Influenza, High Dose Seasonal PF 12/31/2015, 11/24/2016  . PPD Test 06/23/2011  . Pneumococcal Conjugate-13 01/14/2016  . Pneumococcal Polysaccharide-23 02/16/1999  . Tdap 01/13/2017  . Varicella 11/09/2005  . Zoster 08/30/2006   Pertinent  Health Maintenance Due  Topic Date Due  . INFLUENZA VACCINE  Completed  . PNA vac Low Risk Adult  Completed   Fall Risk  10/27/2016 06/09/2016 02/03/2016 10/28/2015 01/16/2015  Falls in the past year? Yes Yes Yes Yes Yes  Number falls in past yr: 2 or more 2 or more 2 or more 2 or more 2 or more  Comment - - 2 falls in last 2 months - -  Injury with Fall? Yes No Yes No (No Data)  Comment L hip - scraps and bruises - head  Risk Factor Category  - - - - -  Risk for fall due to : - - - Impaired balance/gait -  Follow up - - - - -    Vitals:   02/11/17 1142  BP: (!) 162/79  Pulse: (!) 57  Resp: 18  Temp: 98.3 F (36.8 C)  SpO2: 93%  Weight: 140 lb (63.5 kg)  Height: 6' (1.829 m)    Body mass index is 18.99 kg/m. Physical Exam  Constitutional: He is oriented to person, place, and time.  Tall thin frail elderly in no acute distress   HENT:  Head: Normocephalic.  Right Ear: External ear normal.  Left Ear: External ear normal.  Mouth/Throat: Oropharynx is clear and moist. No oropharyngeal exudate.  Eyes: Conjunctivae and EOM are normal. Pupils are equal, round, and reactive to light. Right eye exhibits no discharge. Left eye exhibits no discharge. No scleral icterus.  Neck: Normal range of motion. No JVD present. No thyromegaly present.  Cardiovascular: Normal rate, regular rhythm, normal heart sounds and intact distal pulses. Exam reveals no gallop and no friction rub.  No murmur heard. Pulmonary/Chest: Effort normal and breath sounds normal. No respiratory distress. He has no wheezes. He has no rales.  Abdominal: Soft. Bowel sounds are normal. He exhibits no distension. There is no tenderness. There is no rebound and no guarding.  Genitourinary:  Genitourinary Comments: Wears condom catheter.  Musculoskeletal: He exhibits no tenderness.  Moves x 4 extremities except left hip limited due to pain  Lymphadenopathy:    He has no cervical adenopathy.  Neurological: He is oriented to person, place, and time. Coordination normal.  Speech slow but understood.   Skin: Skin is warm and dry. No rash noted.  Sacral ulcer redness progressive healing.   Psychiatric: He has a normal mood and affect.   Labs reviewed: Recent Labs    01/08/17 1356 01/10/17 1528 01/11/17 0532  NA 140 140 141  K 4.1 4.8 4.1  CL 111 112* 111  CO2 23 21* 25  GLUCOSE 95 87 92  BUN 35* 30* 28*  CREATININE 1.26* 1.13 1.07  CALCIUM 8.7* 8.8* 8.3*   Recent Labs    05/13/16 2021  11/15/16 0800 01/10/17 1528 01/11/17 0532  AST 20 19 18 16   ALT 15* 21 17 15*  ALKPHOS 38  --  38 38  BILITOT 0.4 0.6 0.7 0.7  PROT 5.8* 6.8 6.2* 5.3*  ALBUMIN 3.2*  --  3.2* 2.8*   Recent Labs     05/13/16 2021  01/08/17 1356 01/10/17 1528 01/11/17 0532  WBC 4.7   < > 6.6 6.9 6.3  NEUTROABS 3.3  --  4.6 5.3  --   HGB 13.1   < > 12.4* 12.4* 11.9*  HCT 40.2   < > 38.6* 39.3 38.1*  MCV 92.6   < > 96.5 97.3 96.2  PLT 128*   < > 141* 152 143*   < > = values in this interval not displayed.   Lab Results  Component Value Date   TSH 3.63 01/24/2017   Lab Results  Component Value Date   HGBA1C 5.3 07/31/2012   Lab Results  Component Value Date   CHOL 182 11/15/2016   HDL 68 11/15/2016   LDLCALC 95 02/02/2016   LDLDIRECT 132.5 09/08/2012   TRIG 99 11/15/2016   CHOLHDL 2.7 11/15/2016    Significant Diagnostic Results in last 30 days:  Dg Hip Unilat With Pelvis 2-3 Views Left  Result Date: 02/11/2017 CLINICAL DATA:  Left hip pain with limited weight-bearing and walking. Frequent falls over the last month. History of left total hip arthroplasty. EXAM: DG HIP (WITH OR WITHOUT PELVIS) 2-3V LEFT COMPARISON:  Radiographs 01/08/2017 and 08/09/2016. FINDINGS: Status post left total hip arthroplasty with a screw fixed acetabular component. No evidence of hardware loosening, acute fracture or dislocation. There is stable mild heterotopic ossification surrounding the proximal left femur. Moderate right hip osteoarthritis and lower lumbar spondylosis noted. IMPRESSION: Stable appearance of the left hip status post total arthroplasty. Right hip osteoarthritis. Electronically Signed   By: Richardean Sale M.D.   On: 02/11/2017 08:19    Assessment/Plan 1. Hypothyroidism Lab Results  Component Value Date   TSH 3.63 01/24/2017  Continue on levothyroxine 50 mcg tablet daily.continue to monitor TSH level.  2. Elevated blood pressure reading Blood pressure log reviewed occasional elevated SBP in the 160's-170's noted overall SBP readings in the 110's-150's.off blood pressure medication.Asymptomatic.monitor B/p twice daily x one week. If B/P persist > 160 will initiate low dose of  antihypertensive.continue to monitor.  3. HYPERLIPIDEMIA LDL 95 (11/15/2016).continue on atorvastatin 20 mg tablet daily.Check lipid panel every 3-6 months then reduce atorvastatin dose.     4. Vitamin D deficiency Continue on vit D 1000 units daily.check Vit D level 12/15/2016.   5. Major depression, chronic Mood stable.Halucination reported by patient's wife though none since then.continue on Mirtazapine 30 mg tablet,Bupropion 150 mg tablet daily and Zoloft 200 mg tablet daily.continue to follow up with Psychiatrist Dr. [name]. If Hallucination persist will need a sooner appointment with Dr. [name].  6. Hallucination, visual Halucination reported by patient's wife though none since then.continue on above psychiatry medication.continue to follow up with Psychiatrist Dr. [name]. If Hallucination persist will need a sooner appointment with Dr. [name].continue to monitor.   Family/ staff Communication: Reviewed plan of care with patient and facility Nurse   Labs/tests ordered: Vit D level 12/15/2016.   Sandrea Hughs, NP

## 2017-02-28 LAB — VITAMIN D 25 HYDROXY (VIT D DEFICIENCY, FRACTURES): Vit D, 25-Hydroxy: 24

## 2017-03-16 ENCOUNTER — Telehealth: Payer: Self-pay | Admitting: Emergency Medicine

## 2017-03-17 ENCOUNTER — Encounter: Payer: Self-pay | Admitting: Internal Medicine

## 2017-03-17 ENCOUNTER — Non-Acute Institutional Stay (SKILLED_NURSING_FACILITY): Payer: Medicare Other | Admitting: Internal Medicine

## 2017-03-17 DIAGNOSIS — S72002S Fracture of unspecified part of neck of left femur, sequela: Secondary | ICD-10-CM

## 2017-03-17 DIAGNOSIS — M4802 Spinal stenosis, cervical region: Secondary | ICD-10-CM

## 2017-03-17 DIAGNOSIS — F339 Major depressive disorder, recurrent, unspecified: Secondary | ICD-10-CM | POA: Diagnosis not present

## 2017-03-17 DIAGNOSIS — E43 Unspecified severe protein-calorie malnutrition: Secondary | ICD-10-CM

## 2017-03-17 NOTE — Progress Notes (Signed)
Location:  [facility] Room Number: 34 Place of Service:  SNF [phone]) Provider:  [name] MD  [name], MD  Patient Care Team: [name], MD as PCP - General (Internal Medicine) [name], MD as Consulting Physician (Urology) [name], MD as Consulting Physician (Neurology) [name], MD as Consulting Physician (Dermatology) [name], MD (Inactive) as Consulting Physician (Gastroenterology) [name], MD as Consulting Physician (Orthopedic Surgery) [name], MD as Consulting Physician (Cardiology) [name], MD as Consulting Physician (Ophthalmology) [name], MD as Referring Physician (Psychiatry) [name], MD as Consulting Physician (General Surgery) [name], MD as Consulting Physician (Orthopedic Surgery)  Extended Emergency Contact Information Primary Emergency Contact: Reim,[name] Address: [address]          [place], [place] [address] [place] Phone: [phone] Relation: Spouse  Code Status:  DNR  Goals of care: Advanced Directive information Advanced Directives 03/17/2017  Does Patient Have a Medical Advance Directive? Yes  Type of Paramedic of [place];Living will;Out of facility DNR (pink MOST or yellow form)  Does patient want to make changes to medical advance directive? No - Patient declined  Copy of [place] in Chart? Yes  Pre-existing out of facility DNR order (yellow form or pink MOST form) Pink MOST form placed in chart (order not valid for inpatient use);Yellow form placed in chart (order not valid for inpatient use)     Chief Complaint  Patient presents with  . Medical Management of Chronic Issues    Routine Visit     HPI:  Pt is a 84 y.o. male seen today for medical management of chronic diseases. He is seen in his room with his wife present. He has been working  with therapy team and has made improvement. He continues to have discomfort to left hip area mainly with sitting for long time. Denies any pain with walking. He has been walking with a walker. He has severe cervical spinal stenosis and was seen by neurosurgery yesterday with talk about possible decompression surgery. Notes from visit not available at present. He has been complaint with his medications. He is working with PT, OT and SLP. No new wounds reported.    Past Medical History:  Diagnosis Date  . Acute urinary retention 01/28/2013  . AKI (acute kidney injury) ([facility]) 01/26/2013  . Altered mental status 09/09/2013  . Anemia    B12 deficiency  . ANXIETY 06/07/2007   Qualifier: Diagnosis of  By: Talbert Cage CMA ([place]), June    . ARTHRITIS 06/07/2007   Qualifier: Diagnosis of  By: Talbert Cage CMA ([place]), June    . Balance problems 04/05/2011  . Benign essential tremor   . Benign fibroma of prostate 10/11/2011  . Bilateral inguinal hernia 10/09/2012  . Bilateral leg edema 09/13/2012  . Bladder retention 02/05/2013  . Bradycardia, sinus 08/02/2012  . CAROTID ARTERY DISEASE 03/31/2007   Qualifier: Diagnosis of  By: Linna Darner MD, Gwyndolyn Saxon    . Carotid stenosis    s/p L CEA  . Cataract, nuclear 03/21/2014  . 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 Cervical spinal stenosis   . Cervical stenosis of spine   . Critical lower limb ischemia   . Degeneration macular 11/02/2011  . Depression    Dr Albertine Patricia  . Difficulty in walking(719.7) 04/05/2011  . ESOPHAGITIS 08/06/2002   Qualifier: Diagnosis of  By: Talbert Cage  CMA (AAMA), June    . Essential and other specified forms of tremor 04/18/2013  . Fall at home 01/06/2017   left hip pain  . Falls   . Family history of colon cancer 07/24/2012  . Fecal impaction ([facility]) 10/11/2012  . Femoral hernia, bilateral s/p lap repair 01/16/2013 01/16/2013  . Glaucoma, compensated 03/21/2014  . H/O cardiovascular stress test    a. 02/2003 -> no ischemia/infarct.  Marland Kitchen HAMMER TOE 04/23/2010    Qualifier: Diagnosis of  By: Linna Darner MD, Gwyndolyn Saxon    . Hematoma of leg   . Hip hematoma, right 09/09/2013  . History of shingles 2009  . HTN (hypertension) 08/22/2012  . Hx of echocardiogram    Echocardiogram 08/01/12: Mild LVH, EF 55-60%, normal wall motion.  . Hydrocele 10/11/2011  . Hyperlipidemia   . 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 Hypothyroidism   . Macular degeneration disease   . Macular edema   . Migraines    Dr Jannifer Franklin  . MORTON'S NEUROMA, LEFT 08/22/2007   Qualifier: Diagnosis of  By: Linna Darner MD, Gwyndolyn Saxon    . PAD (peripheral artery disease) ([facility])   . Peripheral neuropathy   . Personal history of colonic polyps 01/05/2013   2014 2 mm adenomatous polyp   . PREMATURE ATRIAL CONTRACTIONS 03/14/2006   Qualifier: Diagnosis of  By: Linna Darner MD, Gwendolyn Lima 12/16/2011  . SPINAL STENOSIS 03/14/2006   Qualifier: Diagnosis of  By: Linna Darner MD, William   Cervical spine    . Testosterone deficiency    Dr Lawerance Bach, [facility]  . Unspecified vitamin D deficiency 08/06/2012  . Urge incontinence 10/09/2012  . Urinary tract infection, site not specified 02/13/2013  . Weakness 08/02/2012   Past Surgical History:  Procedure Laterality Date  . APPENDECTOMY  1941  . BREAST CYST EXCISION Left 01/16/2013   Procedure: MASS EXCISION AXILLA;  Surgeon: Adin Hector, MD;  Location: [facility];  Service: General;  Laterality: Left;  . CAROTID ENDARTERECTOMY  2005    L  . CATARACT EXTRACTION Right   . COLONOSCOPY  2014   Dr. Deatra Ina  . EYE SURGERY Right 02/2012   retina  . INGUINAL HERNIA REPAIR Bilateral 01/16/2013   Procedure: LAPAROSCOPIC BILATERAL INGUINAL DIRECT AND INDIRECT, FEMORAL, AND OBTURATOR HERNIAS;  Surgeon: Adin Hector, MD;  Location: [facility];  Service: General;  Laterality: Bilateral;  . INSERTION OF MESH N/A 01/16/2013   Procedure: INSERTION OF MESH;  Surgeon: Adin Hector, MD;  Location: [facility];  Service: General;  Laterality: N/A;  . LUMBAR LAMINECTOMY    . TONSILLECTOMY AND  ADENOIDECTOMY    . TOTAL HIP ARTHROPLASTY  2005   L    Allergies  Allergen Reactions  . Latex Rash  . Tape Rash    Paper tape is ok to use     Outpatient Encounter Medications as of 03/17/2017  Medication Sig  . acetaminophen (TYLENOL) 325 MG tablet Take 2 tablets (650 mg total) by mouth every 6 (six) hours as needed for mild pain (or Fever >/= 101).  Marland Kitchen acetaminophen (TYLENOL) 500 MG tablet Take 1,000 mg by mouth 2 (two) times daily.  Marland Kitchen aspirin 81 MG tablet Take 81 mg by mouth every morning.   Marland Kitchen atorvastatin (LIPITOR) 20 MG tablet Take one tablet by mouth once daily for cholesterol  . B Complex-C (B-COMPLEX WITH VITAMIN C) tablet Take 1 tablet by mouth daily.  . bimatoprost (LUMIGAN) 0.01 % SOLN Place 1 drop into both eyes at bedtime.  Marland Kitchen  buPROPion (WELLBUTRIN XL) 150 MG 24 hr tablet Take 150 mg by mouth daily.   . Cholecalciferol (VITAMIN D3) 1000 units CAPS Take 1,000 Units by mouth daily.  . clonazePAM (KLONOPIN) 0.5 MG tablet Take 0.25 mg by mouth at bedtime as needed for anxiety (Re-evaluate on 04/21/17).  . fluticasone (CUTIVATE) 0.05 % cream Apply 1 application topically 2 (two) times daily as needed (irritation).   Marland Kitchen HYDROcodone-acetaminophen (NORCO/VICODIN) 5-325 MG tablet Take 1 tablet by mouth every 4 (four) hours as needed for moderate pain.  Marland Kitchen ketoconazole (NIZORAL) 2 % cream Apply 1 application topically daily as needed for irritation.   Javier Docker Oil 500 MG CAPS Take 500 mg by mouth every morning.   Marland Kitchen levothyroxine (SYNTHROID) 50 MCG tablet Take 1/2 tablet by mouth on Sunday mornings and Take one tablet by mouth every other morning for thryoid.  Marland Kitchen linaclotide (LINZESS) 145 MCG CAPS capsule Take 1 capsule (145 mcg total) by mouth daily before breakfast.  . mirtazapine (REMERON) 30 MG tablet Take 1 tablet (30 mg total) by mouth at bedtime.  . Multiple Vitamins-Minerals (ICAPS PO) Take 1 tablet by mouth daily.  . Nutritional Supplements (BOOST PO) Take 1 Can by mouth daily.  .  polyethylene glycol (MIRALAX / GLYCOLAX) packet Take 17 g by mouth daily as needed for mild constipation.  . sertraline (ZOLOFT) 100 MG tablet Take 200 mg by mouth every morning.   . tamsulosin (FLOMAX) 0.4 MG CAPS capsule TAKE 1 CAPSULE (0.4 MG TOTAL) BY MOUTH DAILY.  Marland Kitchen topiramate (TOPAMAX) 25 MG tablet TAKE 1 TABLET BY MOUTH EVERY MORNING AND 2 TABLETS BY MOUTH EVERY EVENING  . zinc oxide 20 % ointment Apply 1 application topically as directed. Apply three times daily and as needed  . [DISCONTINUED] clonazePAM (KLONOPIN) 0.5 MG tablet Take 0.5 tablets (0.25 mg total) by mouth at bedtime as needed for anxiety (sleep). Take one daily for anxiety   No facility-administered encounter medications on file as of 03/17/2017.     Review of Systems  Constitutional: Negative for appetite change, chills and fever.  HENT: Negative for congestion, ear discharge, mouth sores, rhinorrhea and trouble swallowing.   Eyes: Positive for visual disturbance.  Respiratory: Negative for cough, shortness of breath and wheezing.   Cardiovascular: Negative for chest pain and palpitations.  Gastrointestinal: Negative for abdominal pain, constipation, nausea and vomiting.       Had bowel movement this am  Genitourinary: Negative for dysuria.       Has urinary incontinence  Musculoskeletal: Positive for arthralgias and gait problem. Negative for back pain.  Skin: Negative for rash.  Neurological: Positive for tremors. Negative for dizziness, seizures, syncope, numbness and headaches.  Psychiatric/Behavioral: Negative for behavioral problems and confusion.    Immunization History  Administered Date(s) Administered  . DTaP 11/05/2005  . Hepatitis B 03/28/1932  . Influenza Split 11/12/2011, 11/29/2013  . Influenza Whole 10/30/2007, 11/12/2008, 11/26/2009  . Influenza, High Dose Seasonal PF 12/31/2015, 11/24/2016  . PPD Test 06/23/2011  . Pneumococcal Conjugate-13 01/14/2016  . Pneumococcal Polysaccharide-23  02/16/1999  . Tdap 01/13/2017  . Varicella 11/09/2005  . Zoster 08/30/2006   Pertinent  Health Maintenance Due  Topic Date Due  . INFLUENZA VACCINE  Completed  . PNA vac Low Risk Adult  Completed   Fall Risk  10/27/2016 06/09/2016 02/03/2016 10/28/2015 01/16/2015  Falls in the past year? Yes Yes Yes Yes Yes  Number falls in past yr: 2 or more 2 or more 2 or more 2 or  more 2 or more  Comment - - 2 falls in last 2 months - -  Injury with Fall? Yes No Yes No (No Data)  Comment L hip - scraps and bruises - head  Risk Factor Category  - - - - -  Risk for fall due to : - - - Impaired balance/gait -  Follow up - - - - -   Functional Status Survey:    Vitals:   03/17/17 1433  BP: 110/65  Pulse: 69  Resp: 20  Temp: (!) 97.4 F (36.3 C)  TempSrc: Oral  SpO2: 96%  Weight: 144 lb 14.4 oz (65.7 kg)  Height: 6' (1.829 m)   Body mass index is 19.65 kg/m.   Wt Readings from Last 3 Encounters:  03/17/17 144 lb 14.4 oz (65.7 kg)  02/11/17 140 lb (63.5 kg)  01/31/17 143 lb 12.8 oz (65.2 kg)    Physical Exam  Constitutional: He is oriented to person, place, and time.  Thin built and frail, gained some weight since last month, in no acute distress  HENT:  Head: Normocephalic and atraumatic.  Mouth/Throat: Oropharynx is clear and moist. No oropharyngeal exudate.  Eyes: Conjunctivae are normal. Right eye exhibits no discharge. Left eye exhibits no discharge.  Has visual disturbance  Neck: Neck supple.  Cardiovascular: Normal rate and regular rhythm.  Pulmonary/Chest: Effort normal and breath sounds normal. No respiratory distress. He has no rales.  Abdominal: Soft. Bowel sounds are normal. He exhibits no mass. There is no tenderness.  Musculoskeletal:  Able to move all 4 extremities, trace leg edema, arthritis changes, unsteady gait, uses walker and wheelchair  Lymphadenopathy:    He has no cervical adenopathy.  Neurological: He is alert and oriented to person, place, and time.    Tremors to both hands  Skin: Skin is warm and dry. No rash noted. He is not diaphoretic.  Psychiatric: He has a normal mood and affect.    Labs reviewed: Recent Labs    01/08/17 1356 01/10/17 1528 01/11/17 0532 01/24/17  NA 140 140 141 140  K 4.1 4.8 4.1 4.4  CL 111 112* 111  --   CO2 23 21* 25  --   GLUCOSE 95 87 92  --   BUN 35* 30* 28* 28*  CREATININE 1.26* 1.13 1.07 1.2  CALCIUM 8.7* 8.8* 8.3*  --    Recent Labs    05/13/16 2021 11/15/16 0800 01/10/17 1528 01/11/17 0532 01/24/17  AST 20 19 18 16  12*  ALT 15* 21 17 15* 10  ALKPHOS 38  --  38 38 114  BILITOT 0.4 0.6 0.7 0.7  --   PROT 5.8* 6.8 6.2* 5.3*  --   ALBUMIN 3.2*  --  3.2* 2.8*  --    Recent Labs    05/13/16 2021  01/08/17 1356 01/10/17 1528 01/11/17 0532 01/24/17  WBC 4.7   < > 6.6 6.9 6.3 6.3  NEUTROABS 3.3  --  4.6 5.3  --   --   HGB 13.1   < > 12.4* 12.4* 11.9* 13.4*  HCT 40.2   < > 38.6* 39.3 38.1* 42  MCV 92.6   < > 96.5 97.3 96.2  --   PLT 128*   < > 141* 152 143* 239   < > = values in this interval not displayed.   Lab Results  Component Value Date   TSH 3.63 01/24/2017   Lab Results  Component Value Date   HGBA1C 5.3 07/31/2012  Lab Results  Component Value Date   CHOL 182 11/15/2016   HDL 68 11/15/2016   LDLCALC 95 02/02/2016   LDLDIRECT 132.5 09/08/2012   TRIG 99 11/15/2016   CHOLHDL 2.7 11/15/2016    Significant Diagnostic Results in last 30 days:  No results found.  Assessment/Plan  Left hip fracture sequale Post fall. Is WBAT for now. Continue to work with therapy team. On chart review, has not required any norco 5-325 mg in past 2 weeks. Decrease this to 5-325 mg q8h prn pain for now. Continue tylenol 1000 mg bid. Fall precautions  Cervical spinal stenosis Severe with plan for decompression per neurosurgery. Pending OV note from yesterday. At present denies any symptom  Chronic depression Mood appears stable. Continue sertraline 200 mg daily and bupropion 150 mg  daily with topamax 25 mg in am and 50 mg in pm. Continue remeron. Followed by psychiatry services. No recent hallucination reported.   Protein calorie malnutrition Weight low but has gained few pounds. Encourage po intake. Continue nutritional supplement and remeron for now.   Family/ staff Communication: reviewed care plan with patient, his wife and charge nurse.    Labs/tests ordered:  none   [name], MD Internal Medicine [facility] [address] [place], [place] [address] Cell Phone (Monday-Friday 8 am - 5 pm): [phone] On Call: [phone] and follow prompts after 5 pm and on weekends Office Phone: [phone] Office Fax: [phone]

## 2017-04-11 ENCOUNTER — Ambulatory Visit: Payer: Medicare Other | Admitting: Adult Health

## 2017-04-13 ENCOUNTER — Non-Acute Institutional Stay (SKILLED_NURSING_FACILITY): Payer: Medicare Other | Admitting: Family

## 2017-04-13 ENCOUNTER — Encounter: Payer: Self-pay | Admitting: Family

## 2017-04-13 DIAGNOSIS — M25552 Pain in left hip: Secondary | ICD-10-CM

## 2017-04-13 DIAGNOSIS — E039 Hypothyroidism, unspecified: Secondary | ICD-10-CM | POA: Diagnosis not present

## 2017-04-13 DIAGNOSIS — F411 Generalized anxiety disorder: Secondary | ICD-10-CM | POA: Diagnosis not present

## 2017-04-13 DIAGNOSIS — F339 Major depressive disorder, recurrent, unspecified: Secondary | ICD-10-CM

## 2017-04-13 DIAGNOSIS — R1312 Dysphagia, oropharyngeal phase: Secondary | ICD-10-CM | POA: Diagnosis not present

## 2017-04-13 NOTE — Progress Notes (Signed)
Location:  [facility] Room Number: 34 Place of Service:  SNF [phone]) Provider: Dinah [name] FNP-C   [name], MD  Patient Care Team: [name], MD as PCP - General (Internal Medicine) [name], MD as Consulting Physician (Urology) [name], MD as Consulting Physician (Neurology) [name], MD as Consulting Physician (Dermatology) [name], MD (Inactive) as Consulting Physician (Gastroenterology) [name], MD as Consulting Physician (Orthopedic Surgery) [name], MD as Consulting Physician (Cardiology) [name], MD as Consulting Physician (Ophthalmology) [name], MD as Referring Physician (Psychiatry) [name], MD as Consulting Physician (General Surgery) [name], MD as Consulting Physician (Orthopedic Surgery)  Extended Emergency Contact Information Primary Emergency Contact: Lecuyer,[name] Address: [address]          [place], [place] [address] [place] Phone: [phone] Relation: Spouse  Code Status:  DNR Goals of care: Advanced Directive information Advanced Directives 03/17/2017  Does Patient Have a Medical Advance Directive? Yes  Type of Paramedic of [place];Living will;Out of facility DNR (pink MOST or yellow form)  Does patient want to make changes to medical advance directive? No - Patient declined  Copy of [place] in Chart? Yes  Pre-existing out of facility DNR order (yellow form or pink MOST form) Pink MOST form placed in chart (order not valid for inpatient use);Yellow form placed in chart (order not valid for inpatient use)     Chief Complaint  Patient presents with  . Medical Management of Chronic Issues    Routine Visit     HPI:  Pt is a 84 y.o. male seen today [place] for medical management of chronic diseases.He is seen in his room today.His previous sacral  pressure ulcer is resolved.He states has a good appetite.No recent fall episodes.He continues to require assistance with transfer to and from the wheelchair.He has gained 3.9 lbs over one month.Weight is beneficial due to previous weight loss.He states still has trouble swallowing.He was seen by Speech Therapist during visit recommended crush all medication if appropriate with Pharmacy.       Past Medical History:  Diagnosis Date  . Acute urinary retention 01/28/2013  . AKI (acute kidney injury) ([place]) 01/26/2013  . Altered mental status 09/09/2013  . Anemia    B12 deficiency  . ANXIETY 06/07/2007   Qualifier: Diagnosis of  By: Talbert Cage CMA ([place]), June    . ARTHRITIS 06/07/2007   Qualifier: Diagnosis of  By: Talbert Cage CMA ([place]), June    . Balance problems 04/05/2011  . Benign essential tremor   . Benign fibroma of prostate 10/11/2011  . Bilateral inguinal hernia 10/09/2012  . Bilateral leg edema 09/13/2012  . Bladder retention 02/05/2013  . Bradycardia, sinus 08/02/2012  . CAROTID ARTERY DISEASE 03/31/2007   Qualifier: Diagnosis of  By: Linna Darner MD, Gwyndolyn Saxon    . Carotid stenosis    s/p L CEA  . Cataract, nuclear 03/21/2014  . 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 Cervical spinal stenosis   . Cervical stenosis of spine   . Critical lower limb ischemia   . Degeneration macular 11/02/2011  . Depression    Dr Albertine Patricia  . Difficulty in walking(719.7) 04/05/2011  . ESOPHAGITIS 08/06/2002   Qualifier: Diagnosis of  By: Talbert Cage CMA ([place]), June    . Essential and other specified forms of tremor 04/18/2013  . Fall at home 01/06/2017  left hip pain  . Falls   . Family history of colon cancer 07/24/2012  . Fecal impaction ([place]) 10/11/2012  . Femoral hernia, bilateral s/p lap repair 01/16/2013 01/16/2013  . Glaucoma, compensated 03/21/2014  . H/O cardiovascular stress test    a. 02/2003 -> no ischemia/infarct.  Marland Kitchen HAMMER TOE 04/23/2010   Qualifier: Diagnosis of  By: Linna Darner MD, Gwyndolyn Saxon    . Hematoma of leg   .  Hip hematoma, right 09/09/2013  . History of shingles 2009  . HTN (hypertension) 08/22/2012  . Hx of echocardiogram    Echocardiogram 08/01/12: Mild LVH, EF 55-60%, normal wall motion.  . Hydrocele 10/11/2011  . Hyperlipidemia   . 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 Hypothyroidism   . Macular degeneration disease   . Macular edema   . Migraines    Dr Jannifer Franklin  . MORTON'S NEUROMA, LEFT 08/22/2007   Qualifier: Diagnosis of  By: Linna Darner MD, Gwyndolyn Saxon    . PAD (peripheral artery disease) ([place])   . Peripheral neuropathy   . Personal history of colonic polyps 01/05/2013   2014 2 mm adenomatous polyp   . PREMATURE ATRIAL CONTRACTIONS 03/14/2006   Qualifier: Diagnosis of  By: Linna Darner MD, Gwendolyn Lima 12/16/2011  . SPINAL STENOSIS 03/14/2006   Qualifier: Diagnosis of  By: Linna Darner MD, William   Cervical spine    . Testosterone deficiency    Dr Lawerance Bach, [facility]  . Unspecified vitamin D deficiency 08/06/2012  . Urge incontinence 10/09/2012  . Urinary tract infection, site not specified 02/13/2013  . Weakness 08/02/2012   Past Surgical History:  Procedure Laterality Date  . APPENDECTOMY  1941  . BREAST CYST EXCISION Left 01/16/2013   Procedure: MASS EXCISION AXILLA;  Surgeon: Adin Hector, MD;  Location: [facility];  Service: General;  Laterality: Left;  . CAROTID ENDARTERECTOMY  2005    L  . CATARACT EXTRACTION Right   . COLONOSCOPY  2014   Dr. Deatra Ina  . EYE SURGERY Right 02/2012   retina  . INGUINAL HERNIA REPAIR Bilateral 01/16/2013   Procedure: LAPAROSCOPIC BILATERAL INGUINAL DIRECT AND INDIRECT, FEMORAL, AND OBTURATOR HERNIAS;  Surgeon: Adin Hector, MD;  Location: [facility];  Service: General;  Laterality: Bilateral;  . INSERTION OF MESH N/A 01/16/2013   Procedure: INSERTION OF MESH;  Surgeon: Adin Hector, MD;  Location: [facility];  Service: General;  Laterality: N/A;  . LUMBAR LAMINECTOMY    . TONSILLECTOMY AND ADENOIDECTOMY    . TOTAL HIP ARTHROPLASTY  2005   L    Allergies    Allergen Reactions  . Latex Rash  . Tape Rash    Paper tape is ok to use     Allergies as of 04/13/2017      Reactions   Latex Rash   Tape Rash   Paper tape is ok to use      Medication List        Accurate as of 04/13/17 11:02 PM. Always use your most recent med list.          acetaminophen 500 MG tablet Commonly known as:  TYLENOL Take 1,000 mg by mouth 2 (two) times daily.   acetaminophen 325 MG tablet Commonly known as:  TYLENOL Take 2 tablets (650 mg total) by mouth every 6 (six) hours as needed for mild pain (or Fever >/= 101).   aspirin 81 MG tablet Take 81 mg by mouth every morning.   atorvastatin 20 MG tablet Commonly known as:  LIPITOR Take one tablet by mouth once daily for cholesterol   B-complex with vitamin C tablet Take 1 tablet by mouth daily.   bimatoprost 0.01 % Soln Commonly known as:  LUMIGAN Place 1 drop into both eyes at bedtime.   BOOST PO Take 1 Can by mouth daily.   buPROPion 150 MG 24 hr tablet Commonly known as:  WELLBUTRIN XL Take 150 mg by mouth daily.   clonazePAM 0.5 MG tablet Commonly known as:  KLONOPIN Take 0.25 mg by mouth at bedtime as needed for anxiety (Re-evaluate on 04/21/17).   fluticasone 0.05 % cream Commonly known as:  CUTIVATE Apply 1 application topically 2 (two) times daily as needed (irritation).   HYDROcodone-acetaminophen 5-325 MG tablet Commonly known as:  NORCO/VICODIN Take 1 tablet by mouth every 8 (eight) hours as needed for moderate pain.   ICAPS PO Take 1 tablet by mouth daily.   ketoconazole 2 % cream Commonly known as:  NIZORAL Apply 1 application topically daily as needed for irritation.   Krill Oil 500 MG Caps Take 500 mg by mouth every morning.   levothyroxine 50 MCG tablet Commonly known as:  SYNTHROID Take 1/2 tablet by mouth on Sunday mornings and Take one tablet by mouth every other morning for thryoid.   linaclotide 145 MCG Caps capsule Commonly known as:  LINZESS Take 1  capsule (145 mcg total) by mouth daily before breakfast.   mirtazapine 30 MG tablet Commonly known as:  REMERON Take 1 tablet (30 mg total) by mouth at bedtime.   polyethylene glycol packet Commonly known as:  MIRALAX / GLYCOLAX Take 17 g by mouth daily as needed for mild constipation.   sertraline 100 MG tablet Commonly known as:  ZOLOFT Take 200 mg by mouth every morning.   tamsulosin 0.4 MG Caps capsule Commonly known as:  FLOMAX TAKE 1 CAPSULE (0.4 MG TOTAL) BY MOUTH DAILY.   topiramate 25 MG tablet Commonly known as:  TOPAMAX TAKE 1 TABLET BY MOUTH EVERY MORNING AND 2 TABLETS BY MOUTH EVERY EVENING   Vitamin D3 1000 units Caps Take 2,000 Units by mouth daily.   zinc oxide 20 % ointment Apply 1 application topically as directed. Apply three times daily and as needed       Review of Systems  Constitutional: Negative for appetite change, chills, fatigue and fever.  HENT: Positive for trouble swallowing. Negative for congestion, rhinorrhea, sinus pressure, sinus pain, sneezing and sore throat.   Eyes: Positive for visual disturbance. Negative for discharge, redness and itching.       Left eye macular degeneration   Respiratory: Negative for cough, chest tightness, shortness of breath and wheezing.   Cardiovascular: Negative for chest pain, palpitations and leg swelling.  Gastrointestinal: Negative for abdominal distention, abdominal pain, constipation, diarrhea, nausea and vomiting.  Endocrine: Negative for cold intolerance, heat intolerance, polydipsia, polyphagia and polyuria.  Genitourinary: Negative for dysuria, flank pain, frequency and urgency.  Musculoskeletal: Positive for arthralgias and gait problem.       Left hip pain   Skin: Negative for color change, pallor, rash and wound.  Neurological: Negative for dizziness, light-headedness and headaches.  Hematological: Does not bruise/bleed easily.  Psychiatric/Behavioral: Negative for agitation, confusion and sleep  disturbance. The patient is not nervous/anxious.     Immunization History  Administered Date(s) Administered  . DTaP 11/05/2005  . Hepatitis B May 02, 1932  . Influenza Split 11/12/2011, 11/29/2013  . Influenza Whole 10/30/2007, 11/12/2008, 11/26/2009  . Influenza, High Dose Seasonal PF 12/31/2015, 11/24/2016  .  PPD Test 06/23/2011  . Pneumococcal Conjugate-13 01/14/2016  . Pneumococcal Polysaccharide-23 02/16/1999  . Tdap 01/13/2017  . Varicella 11/09/2005  . Zoster 08/30/2006   Pertinent  Health Maintenance Due  Topic Date Due  . INFLUENZA VACCINE  Completed  . PNA vac Low Risk Adult  Completed   Fall Risk  10/27/2016 06/09/2016 02/03/2016 10/28/2015 01/16/2015  Falls in the past year? Yes Yes Yes Yes Yes  Number falls in past yr: 2 or more 2 or more 2 or more 2 or more 2 or more  Comment - - 2 falls in last 2 months - -  Injury with Fall? Yes No Yes No (No Data)  Comment L hip - scraps and bruises - head  Risk Factor Category  - - - - -  Risk for fall due to : - - - Impaired balance/gait -  Follow up - - - - -    Vitals:   04/13/17 1415  BP: 128/64  Pulse: 61  Resp: 18  Temp: 97.7 F (36.5 C)  TempSrc: Oral  SpO2: 94%  Weight: 146 lb 4.8 oz (66.4 kg)  Height: 6' (1.829 m)   Body mass index is 19.84 kg/m. Physical Exam  Constitutional: He is oriented to person, place, and time.  Thin tall frail elderly in no acute distress   HENT:  Head: Normocephalic.  Right Ear: External ear normal.  Left Ear: External ear normal.  Mouth/Throat: Oropharynx is clear and moist. No oropharyngeal exudate.  Eyes: Conjunctivae and EOM are normal. Pupils are equal, round, and reactive to light. Right eye exhibits no discharge. Left eye exhibits no discharge. No scleral icterus.  Eye glasses in place   Neck: Normal range of motion. No JVD present. No thyromegaly present.  Cardiovascular: Normal rate, regular rhythm, normal heart sounds and intact distal pulses. Exam reveals no gallop  and no friction rub.  No murmur heard. Pulmonary/Chest: Effort normal and breath sounds normal. No respiratory distress. He has no wheezes. He has no rales.  Abdominal: Soft. Bowel sounds are normal. He exhibits no distension. There is no tenderness. There is no rebound and no guarding.  Musculoskeletal: He exhibits no edema or tenderness.  Moves x 4 extremities except left hip limited due to pain.Transfers with assistance to wheelchair.Ambulates with FWW with assistance.  Lymphadenopathy:    He has no cervical adenopathy.  Neurological: He is oriented to person, place, and time. Coordination normal.  Skin: Skin is warm and dry. No rash noted. No erythema. No pallor.  Psychiatric: He has a normal mood and affect.    Labs reviewed: Recent Labs    01/08/17 1356 01/10/17 1528 01/11/17 0532 01/24/17  NA 140 140 141 140  K 4.1 4.8 4.1 4.4  CL 111 112* 111  --   CO2 23 21* 25  --   GLUCOSE 95 87 92  --   BUN 35* 30* 28* 28*  CREATININE 1.26* 1.13 1.07 1.2  CALCIUM 8.7* 8.8* 8.3*  --    Recent Labs    05/13/16 2021 11/15/16 0800 01/10/17 1528 01/11/17 0532 01/24/17  AST 20 19 18 16  12*  ALT 15* 21 17 15* 10  ALKPHOS 38  --  38 38 114  BILITOT 0.4 0.6 0.7 0.7  --   PROT 5.8* 6.8 6.2* 5.3*  --   ALBUMIN 3.2*  --  3.2* 2.8*  --    Recent Labs    05/13/16 2021  01/08/17 1356 01/10/17 1528 01/11/17 0532 01/24/17  WBC  4.7   < > 6.6 6.9 6.3 6.3  NEUTROABS 3.3  --  4.6 5.3  --   --   HGB 13.1   < > 12.4* 12.4* 11.9* 13.4*  HCT 40.2   < > 38.6* 39.3 38.1* 42  MCV 92.6   < > 96.5 97.3 96.2  --   PLT 128*   < > 141* 152 143* 239   < > = values in this interval not displayed.   Lab Results  Component Value Date   TSH 3.63 01/24/2017   Lab Results  Component Value Date   HGBA1C 5.3 07/31/2012   Lab Results  Component Value Date   CHOL 182 11/15/2016   HDL 68 11/15/2016   LDLCALC 95 02/02/2016   LDLDIRECT 132.5 09/08/2012   TRIG 99 11/15/2016   CHOLHDL 2.7 11/15/2016      Significant Diagnostic Results in last 30 days:  No results found.  Assessment/Plan 1. Left hip pain Tylenol 1000 mg tablet twice daily effective.continue to monitor.continue with Therapy.  2. Oropharyngeal dysphagia Hx left side hemiparesis.seen by Speech Therapy recommended crushing all medication if appropriate with Pharmacy.continue on Aspiration precaution.  3. Hypothyroidism, unspecified type Lab Results  Component Value Date   TSH 3.63 01/24/2017  Continue on levothyroxine 50 mcg tablet.continue to monitor TSH level.   4. Major depression Mood stable.continue on Remeron 30 mg tablet,Zoloft 200 mg tablet and Wellbutrin XL 150 mg tablet daily.continue to monitor for mood changes.   5. Anxiety state Stable.continue on clonazepam 0.25 mg tablet at bedtime as needed.   Family/ staff Communication: Reviewed plan of care with patient and facility Nurse  Labs/tests ordered: None   [name] [name], NP

## 2017-04-14 ENCOUNTER — Encounter: Payer: Self-pay | Admitting: *Deleted

## 2017-04-14 LAB — COMPLETE METABOLIC PANEL WITH GFR
ALK PHOS: 40
ALT: 15
AST: 15
Albumin: 3.3
BUN: 34 — AB (ref 4–21)
CALCIUM: 8.8
Creat: 1.26
GLUCOSE: 76
Potassium: 4.4
Sodium: 142
TOTAL PROTEIN: 5.4 g/dL
Total Bilirubin: 0.4

## 2017-04-14 LAB — BASIC METABOLIC PANEL
CREATININE: 1.3 (ref 0.6–1.3)
GLUCOSE: 76
POTASSIUM: 4.4 (ref 3.4–5.3)
Sodium: 142 (ref 137–147)

## 2017-04-14 LAB — HEPATIC FUNCTION PANEL
ALT: 15 (ref 10–40)
AST: 15 (ref 14–40)
Alkaline Phosphatase: 40 (ref 25–125)
Bilirubin, Total: 0.4

## 2017-04-14 LAB — CBC AND DIFFERENTIAL
HCT: 42 (ref 41–53)
Hemoglobin: 14 (ref 13.5–17.5)
Platelets: 134 — AB (ref 150–399)
WBC: 5.7

## 2017-04-14 LAB — CBC
HCT: 42.1
HGB: 14
WBC: 5.7
platelet count: 134

## 2017-04-26 ENCOUNTER — Other Ambulatory Visit: Payer: Self-pay | Admitting: Internal Medicine

## 2017-05-09 ENCOUNTER — Encounter: Payer: Self-pay | Admitting: Family

## 2017-05-09 ENCOUNTER — Non-Acute Institutional Stay (SKILLED_NURSING_FACILITY): Payer: Medicare Other | Admitting: Family

## 2017-05-09 DIAGNOSIS — M25552 Pain in left hip: Secondary | ICD-10-CM | POA: Diagnosis not present

## 2017-05-09 DIAGNOSIS — H109 Unspecified conjunctivitis: Secondary | ICD-10-CM | POA: Diagnosis not present

## 2017-05-09 NOTE — Progress Notes (Signed)
Location:  [facility] Room Number: 34 Place of Service:  SNF [id number]) Provider: Dinah [name] FNP-C  [name], MD  Patient Care Team: [name], MD as PCP - General (Internal Medicine) [name], MD as Consulting Physician (Urology) [name], MD as Consulting Physician (Neurology) [name], MD as Consulting Physician (Dermatology) [name], MD (Inactive) as Consulting Physician (Gastroenterology) [name], MD as Consulting Physician (Orthopedic Surgery) [name], MD as Consulting Physician (Cardiology) [name], MD as Consulting Physician (Ophthalmology) [name], MD as Referring Physician (Psychiatry) [name], MD as Consulting Physician (General Surgery) [name], MD as Consulting Physician (Orthopedic Surgery)  Extended Emergency Contact Information Primary Emergency Contact: Viscomi,[name] Address: [address]          [place], [place] [address] [place] Phone: [phone] Relation: Spouse  Code Status:  DNR Goals of care: Advanced Directive information Advanced Directives 05/09/2017  Does Patient Have a Medical Advance Directive? Yes  Type of Paramedic of [place];Out of facility DNR (pink MOST or yellow form)  Does patient want to make changes to medical advance directive? -  Copy of [place] in Chart? Yes  Pre-existing out of facility DNR order (yellow form or pink MOST form) Yellow form placed in chart (order not valid for inpatient use);Pink MOST form placed in chart (order not valid for inpatient use)     Chief Complaint  Patient presents with  . Acute Visit    left hip pain    HPI:  Pt is a 84 y.o. male seen today at [facility] for an acute visit for evaluation of left hip pain.He states had left hip pain over the weekend thought he might have " moved in a certain way while  getting to bed".He states left hip pain has improved. He was able to walk with walker with Therapy prior to visit.Of note he is status post left hip arthroplasty in 2005.On call provider ordered left hip X-ray.Results showed no acute fracture (05/08/2017).   Past Medical History:  Diagnosis Date  . Acute urinary retention 01/28/2013  . AKI (acute kidney injury) ([place]) 01/26/2013  . Altered mental status 09/09/2013  . Anemia    B12 deficiency  . ANXIETY 06/07/2007   Qualifier: Diagnosis of  By: Talbert Cage CMA ([place]), June    . ARTHRITIS 06/07/2007   Qualifier: Diagnosis of  By: Talbert Cage CMA ([place]), June    . Balance problems 04/05/2011  . Benign essential tremor   . Benign fibroma of prostate 10/11/2011  . Bilateral inguinal hernia 10/09/2012  . Bilateral leg edema 09/13/2012  . Bladder retention 02/05/2013  . Bradycardia, sinus 08/02/2012  . CAROTID ARTERY DISEASE 03/31/2007   Qualifier: Diagnosis of  By: Linna Darner MD, Gwyndolyn Saxon    . Carotid stenosis    s/p L CEA  . Cataract, nuclear 03/21/2014  . 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 Cervical spinal stenosis   . Cervical stenosis of spine   . Critical lower limb ischemia   . Degeneration macular 11/02/2011  . Depression    Dr Albertine Patricia  . Difficulty in walking(719.7) 04/05/2011  . ESOPHAGITIS 08/06/2002   Qualifier: Diagnosis of  By: Talbert Cage CMA ([place]), June    . Essential and other specified forms of tremor 04/18/2013  . Fall at home 01/06/2017   left hip pain  . Falls   . Family  history of colon cancer 07/24/2012  . Fecal impaction ([place]) 10/11/2012  . Femoral hernia, bilateral s/p lap repair 01/16/2013 01/16/2013  . Glaucoma, compensated 03/21/2014  . H/O cardiovascular stress test    a. 02/2003 -> no ischemia/infarct.  Marland Kitchen HAMMER TOE 04/23/2010   Qualifier: Diagnosis of  By: Linna Darner MD, Gwyndolyn Saxon    . Hematoma of leg   . Hip hematoma, right 09/09/2013  . History of shingles 2009  . HTN (hypertension) 08/22/2012  . Hx of echocardiogram    Echocardiogram  08/01/12: Mild LVH, EF 55-60%, normal wall motion.  . Hydrocele 10/11/2011  . Hyperlipidemia   . 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 Hypothyroidism   . Macular degeneration disease   . Macular edema   . Migraines    Dr Jannifer Franklin  . MORTON'S NEUROMA, LEFT 08/22/2007   Qualifier: Diagnosis of  By: Linna Darner MD, Gwyndolyn Saxon    . PAD (peripheral artery disease) ([place])   . Peripheral neuropathy   . Personal history of colonic polyps 01/05/2013   2014 2 mm adenomatous polyp   . PREMATURE ATRIAL CONTRACTIONS 03/14/2006   Qualifier: Diagnosis of  By: Linna Darner MD, Gwendolyn Lima 12/16/2011  . SPINAL STENOSIS 03/14/2006   Qualifier: Diagnosis of  By: Linna Darner MD, William   Cervical spine    . Testosterone deficiency    Dr Lawerance Bach, [facility]  . Unspecified vitamin D deficiency 08/06/2012  . Urge incontinence 10/09/2012  . Urinary tract infection, site not specified 02/13/2013  . Weakness 08/02/2012   Past Surgical History:  Procedure Laterality Date  . APPENDECTOMY  1941  . BREAST CYST EXCISION Left 01/16/2013   Procedure: MASS EXCISION AXILLA;  Surgeon: Adin Hector, MD;  Location: [facility];  Service: General;  Laterality: Left;  . CAROTID ENDARTERECTOMY  2005    L  . CATARACT EXTRACTION Right   . COLONOSCOPY  2014   Dr. Deatra Ina  . EYE SURGERY Right 02/2012   retina  . INGUINAL HERNIA REPAIR Bilateral 01/16/2013   Procedure: LAPAROSCOPIC BILATERAL INGUINAL DIRECT AND INDIRECT, FEMORAL, AND OBTURATOR HERNIAS;  Surgeon: Adin Hector, MD;  Location: [facility];  Service: General;  Laterality: Bilateral;  . INSERTION OF MESH N/A 01/16/2013   Procedure: INSERTION OF MESH;  Surgeon: Adin Hector, MD;  Location: [facility];  Service: General;  Laterality: N/A;  . LUMBAR LAMINECTOMY    . TONSILLECTOMY AND ADENOIDECTOMY    . TOTAL HIP ARTHROPLASTY  2005   L    Allergies  Allergen Reactions  . Latex Rash  . Tape Rash    Paper tape is ok to use     Outpatient Encounter Medications as of 05/09/2017    Medication Sig  . acetaminophen (TYLENOL) 325 MG tablet Take 2 tablets (650 mg total) by mouth every 6 (six) hours as needed for mild pain (or Fever >/= 101).  Marland Kitchen acetaminophen (TYLENOL) 500 MG tablet Take 1,000 mg by mouth 2 (two) times daily.  Marland Kitchen aspirin 81 MG tablet Take 81 mg by mouth every morning.   Marland Kitchen atorvastatin (LIPITOR) 20 MG tablet TAKE 1 TABLET BY MOUTH DAILY FOR CHOLESTEROL  . B Complex-C (B-COMPLEX WITH VITAMIN C) tablet Take 1 tablet by mouth daily.  . bimatoprost (LUMIGAN) 0.01 % SOLN Place 1 drop into both eyes at bedtime.  Marland Kitchen buPROPion (WELLBUTRIN XL) 150 MG 24 hr tablet Take 150 mg by mouth daily.   . Cholecalciferol (VITAMIN D3) 1000 units CAPS Take 2,000 Units by mouth daily.   Marland Kitchen  fluticasone (CUTIVATE) 0.05 % cream Apply 1 application topically 2 (two) times daily as needed (irritation).   Marland Kitchen HYDROcodone-acetaminophen (NORCO/VICODIN) 5-325 MG tablet Take 1 tablet by mouth every 8 (eight) hours as needed for moderate pain.  Marland Kitchen ketoconazole (NIZORAL) 2 % cream Apply 1 application topically daily as needed for irritation.   Javier Docker Oil 500 MG CAPS Take 500 mg by mouth every morning.   Marland Kitchen levothyroxine (SYNTHROID) 50 MCG tablet Take 1/2 tablet by mouth on Sunday mornings and Take one tablet by mouth every other morning for thryoid.  Marland Kitchen linaclotide (LINZESS) 145 MCG CAPS capsule Take 1 capsule (145 mcg total) by mouth daily before breakfast.  . mirtazapine (REMERON) 30 MG tablet Take 1 tablet (30 mg total) by mouth at bedtime.  . Multiple Vitamins-Minerals (ICAPS PO) Take 1 tablet by mouth daily.  . Nutritional Supplements (BOOST PO) Take 1 Can by mouth daily.  . polyethylene glycol (MIRALAX / GLYCOLAX) packet Take 17 g by mouth daily as needed for mild constipation.  . sertraline (ZOLOFT) 100 MG tablet Take 200 mg by mouth every morning.   . tamsulosin (FLOMAX) 0.4 MG CAPS capsule TAKE 1 CAPSULE (0.4 MG TOTAL) BY MOUTH DAILY.  Marland Kitchen topiramate (TOPAMAX) 25 MG tablet TAKE 1 TABLET BY  MOUTH EVERY MORNING AND 2 TABLETS BY MOUTH EVERY EVENING  . zinc oxide 20 % ointment Apply 1 application topically as directed. Apply three times daily and as needed  . [DISCONTINUED] clonazePAM (KLONOPIN) 0.5 MG tablet Take 0.25 mg by mouth at bedtime as needed for anxiety (Re-evaluate on 04/21/17).   No facility-administered encounter medications on file as of 05/09/2017.     Review of Systems  Constitutional: Negative for activity change, appetite change, chills, fatigue and fever.  Respiratory: Negative for cough, chest tightness, shortness of breath and wheezing.   Cardiovascular: Negative for chest pain, palpitations and leg swelling.  Gastrointestinal: Negative for abdominal distention, abdominal pain, constipation, diarrhea, nausea and vomiting.  Genitourinary: Negative for dysuria, flank pain, frequency and urgency.  Musculoskeletal: Positive for arthralgias and gait problem.  Skin: Negative for color change, pallor and rash.  Neurological: Negative for dizziness, light-headedness and headaches.  Hematological: Does not bruise/bleed easily.  Psychiatric/Behavioral: Negative for agitation. The patient is not nervous/anxious.     Immunization History  Administered Date(s) Administered  . DTaP 11/05/2005  . Hepatitis B 04-25-1932  . Influenza Split 11/12/2011, 11/29/2013  . Influenza Whole 10/30/2007, 11/12/2008, 11/26/2009  . Influenza, High Dose Seasonal PF 12/31/2015, 11/24/2016  . PPD Test 06/23/2011  . Pneumococcal Conjugate-13 01/14/2016  . Pneumococcal Polysaccharide-23 02/16/1999  . Tdap 01/13/2017  . Varicella 11/09/2005  . Zoster 08/30/2006   Pertinent  Health Maintenance Due  Topic Date Due  . INFLUENZA VACCINE  Completed  . PNA vac Low Risk Adult  Completed   Fall Risk  10/27/2016 06/09/2016 02/03/2016 10/28/2015 01/16/2015  Falls in the past year? Yes Yes Yes Yes Yes  Number falls in past yr: 2 or more 2 or more 2 or more 2 or more 2 or more  Comment - - 2 falls  in last 2 months - -  Injury with Fall? Yes No Yes No (No Data)  Comment L hip - scraps and bruises - head  Risk Factor Category  - - - - -  Risk for fall due to : - - - Impaired balance/gait -  Follow up - - - - -    Vitals:   05/09/17 1127  BP: (!) 158/74  Pulse: 62  Resp: 18  Temp: (!) 97 F (36.1 C)  SpO2: 95%  Weight: 150 lb (68 kg)  Height: 6' (1.829 m)   Body mass index is 20.34 kg/m. Physical Exam  Constitutional: He is oriented to person, place, and time.  Tall thin built,elderly in no acute distress   HENT:  Head: Normocephalic.  Mouth/Throat: Oropharynx is clear and moist. No oropharyngeal exudate.  Neck: Normal range of motion. No JVD present. No thyromegaly present.  Cardiovascular: Normal rate, regular rhythm, normal heart sounds and intact distal pulses. Exam reveals no gallop and no friction rub.  No murmur heard. Pulmonary/Chest: Effort normal and breath sounds normal. No respiratory distress. He has no wheezes. He has no rales.  Abdominal: Soft. Bowel sounds are normal. He exhibits no distension. There is no tenderness. There is no rebound and no guarding.  Musculoskeletal: He exhibits no edema, tenderness or deformity.  Unsteady gait uses wheelchair and FWW with assistance.   Lymphadenopathy:    He has no cervical adenopathy.  Neurological: He is oriented to person, place, and time. Coordination normal.  Skin: Skin is warm and dry. No rash noted. No erythema. No pallor.  Psychiatric: He has a normal mood and affect. His behavior is normal.    Labs reviewed: Recent Labs    01/08/17 1356 01/10/17 1528 01/11/17 0532 01/24/17 04/14/17  NA 140 140 141 140 142  K 4.1 4.8 4.1 4.4 4.4  CL 111 112* 111  --   --   CO2 23 21* 25  --   --   GLUCOSE 95 87 92  --   --   BUN 35* 30* 28* 28* 34*  CREATININE 1.26* 1.13 1.07 1.2 1.26  CALCIUM 8.7* 8.8* 8.3*  --  8.8   Recent Labs    01/10/17 1528 01/11/17 0532 01/24/17 04/14/17  AST 18 16 12* 15  ALT 17  15* 10 15  ALKPHOS 38 38 114 40  BILITOT 0.7 0.7  --  0.4  PROT 6.2* 5.3*  --  5.4  ALBUMIN 3.2* 2.8*  --  3.3   Recent Labs    05/13/16 2021  01/08/17 1356 01/10/17 1528 01/11/17 0532 01/24/17 04/14/17  WBC 4.7   < > 6.6 6.9 6.3 6.3 5.7  NEUTROABS 3.3  --  4.6 5.3  --   --   --   HGB 13.1   < > 12.4* 12.4* 11.9* 13.4* 14.0  HCT 40.2   < > 38.6* 39.3 38.1* 42 42.1  MCV 92.6   < > 96.5 97.3 96.2  --   --   PLT 128*   < > 141* 152 143* 239  --    < > = values in this interval not displayed.   Lab Results  Component Value Date   TSH 3.63 01/24/2017   Lab Results  Component Value Date   HGBA1C 5.3 07/31/2012   Lab Results  Component Value Date   CHOL 182 11/15/2016   HDL 68 11/15/2016   LDLCALC 95 11/15/2016   LDLDIRECT 132.5 09/08/2012   TRIG 99 11/15/2016   CHOLHDL 2.7 11/15/2016    Significant Diagnostic Results in last 30 days:  No results found.  Assessment/Plan  Pain in left hip Had increased pain over the weekend x-ray ordered by On call provider.X-ray resulted negative for acute changes.Pain has improved with current pain regimen.continue to monitor for now.  Family/ staff Communication: Reviewed plan of care with patient and facility Nurse.  Labs/tests ordered: None  Sandrea Hughs, NP

## 2017-05-16 LAB — LIPID PANEL
Cholesterol: 125
Cholesterol: 125 (ref 0–200)
HDL: 34
HDL: 34 — AB (ref 35–70)
LDL CALC: 74
LDL Cholesterol: 74
TRIGLYCERIDES: 88
Triglycerides: 88 (ref 40–160)

## 2017-05-17 ENCOUNTER — Encounter: Payer: Self-pay | Admitting: *Deleted

## 2017-06-10 ENCOUNTER — Non-Acute Institutional Stay (SKILLED_NURSING_FACILITY): Payer: Medicare Other | Admitting: Family

## 2017-06-10 ENCOUNTER — Encounter: Payer: Self-pay | Admitting: Family

## 2017-06-10 DIAGNOSIS — H109 Unspecified conjunctivitis: Secondary | ICD-10-CM | POA: Diagnosis not present

## 2017-06-10 NOTE — Progress Notes (Signed)
Location:  [facility] Room Number: 34 Place of Service:  SNF [phone]) Provider: Zoii Florer FNP-C  [name], MD  Patient Care Team: [name], MD as PCP - General (Internal Medicine) [name], MD as Consulting Physician (Urology) [name], MD as Consulting Physician (Neurology) [name], MD as Consulting Physician (Dermatology) [name], MD (Inactive) as Consulting Physician (Gastroenterology) [name], MD as Consulting Physician (Orthopedic Surgery) [name], MD as Consulting Physician (Cardiology) [name], MD as Consulting Physician (Ophthalmology) [name], MD as Referring Physician (Psychiatry) [name], MD as Consulting Physician (General Surgery) [name], MD as Consulting Physician (Orthopedic Surgery)  Extended Emergency Contact Information Primary Emergency Contact: Nebel,[name] Address: [address]          [place], [place] [address] [place] Phone: [phone] Relation: Spouse  Code Status:  DNR Goals of care: Advanced Directive information Advanced Directives 06/10/2017  Does Patient Have a Medical Advance Directive? -  Type of Paramedic of [place];Out of facility DNR (pink MOST or yellow form);Living will  Does patient want to make changes to medical advance directive? -  Copy of [place] in Chart? Yes  Pre-existing out of facility DNR order (yellow form or pink MOST form) Pink MOST form placed in chart (order not valid for inpatient use)     Chief Complaint  Patient presents with  . Acute Visit    left eye infection    HPI:  Pt is a 84 y.o. male seen today at [facility] for an acute visit for evaluation of left eye infection.He is seen in his room today per facility Nurse request.Nurse reports patient's left eye red with drainage.He denies any pain,itching to left  eye but states having discomfort. No fever or chills reported.    Past Medical History:  Diagnosis Date  . Acute urinary retention 01/28/2013  . AKI (acute kidney injury) ([place]) 01/26/2013  . Altered mental status 09/09/2013  . Anemia    B12 deficiency  . ANXIETY 06/07/2007   Qualifier: Diagnosis of  By: Talbert Cage CMA ([place]), June    . ARTHRITIS 06/07/2007   Qualifier: Diagnosis of  By: Talbert Cage CMA ([place]), June    . Balance problems 04/05/2011  . Benign essential tremor   . Benign fibroma of prostate 10/11/2011  . Bilateral inguinal hernia 10/09/2012  . Bilateral leg edema 09/13/2012  . Bladder retention 02/05/2013  . Bradycardia, sinus 08/02/2012  . CAROTID ARTERY DISEASE 03/31/2007   Qualifier: Diagnosis of  By: Linna Darner MD, Gwyndolyn Saxon    . Carotid stenosis    s/p L CEA  . Cataract, nuclear 03/21/2014  . 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 Cervical spinal stenosis   . Cervical stenosis of spine   . Critical lower limb ischemia   . Degeneration macular 11/02/2011  . Depression    Dr Albertine Patricia  . Difficulty in walking(719.7) 04/05/2011  . ESOPHAGITIS 08/06/2002   Qualifier: Diagnosis of  By: Talbert Cage CMA ([place]), June    . Essential and other specified forms of tremor 04/18/2013  . Fall at home 01/06/2017   left hip pain  . Falls   . Family history of colon cancer 07/24/2012  . Fecal impaction ([place]) 10/11/2012  . Femoral hernia, bilateral s/p lap repair 01/16/2013 01/16/2013  . Glaucoma, compensated 03/21/2014  . H/O cardiovascular stress test    a. 02/2003 ->  no ischemia/infarct.  Marland Kitchen HAMMER TOE 04/23/2010   Qualifier: Diagnosis of  By: Linna Darner MD, Gwyndolyn Saxon    . Hematoma of leg   . Hip hematoma, right 09/09/2013  . History of shingles 2009  . HTN (hypertension) 08/22/2012  . Hx of echocardiogram    Echocardiogram 08/01/12: Mild LVH, EF 55-60%, normal wall motion.  . Hydrocele 10/11/2011  . Hyperlipidemia   . 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 Hypothyroidism   . Macular degeneration disease   .  Macular edema   . Migraines    Dr Jannifer Franklin  . MORTON'S NEUROMA, LEFT 08/22/2007   Qualifier: Diagnosis of  By: Linna Darner MD, Gwyndolyn Saxon    . PAD (peripheral artery disease) ([place])   . Peripheral neuropathy   . Personal history of colonic polyps 01/05/2013   2014 2 mm adenomatous polyp   . PREMATURE ATRIAL CONTRACTIONS 03/14/2006   Qualifier: Diagnosis of  By: Linna Darner MD, Gwendolyn Lima 12/16/2011  . SPINAL STENOSIS 03/14/2006   Qualifier: Diagnosis of  By: Linna Darner MD, William   Cervical spine    . Testosterone deficiency    Dr Lawerance Bach, [facility]  . Unspecified vitamin D deficiency 08/06/2012  . Urge incontinence 10/09/2012  . Urinary tract infection, site not specified 02/13/2013  . Weakness 08/02/2012   Past Surgical History:  Procedure Laterality Date  . APPENDECTOMY  1941  . BREAST CYST EXCISION Left 01/16/2013   Procedure: MASS EXCISION AXILLA;  Surgeon: Adin Hector, MD;  Location: [facility];  Service: General;  Laterality: Left;  . CAROTID ENDARTERECTOMY  2005    L  . CATARACT EXTRACTION Right   . COLONOSCOPY  2014   Dr. Deatra Ina  . EYE SURGERY Right 02/2012   retina  . INGUINAL HERNIA REPAIR Bilateral 01/16/2013   Procedure: LAPAROSCOPIC BILATERAL INGUINAL DIRECT AND INDIRECT, FEMORAL, AND OBTURATOR HERNIAS;  Surgeon: Adin Hector, MD;  Location: [facility];  Service: General;  Laterality: Bilateral;  . INSERTION OF MESH N/A 01/16/2013   Procedure: INSERTION OF MESH;  Surgeon: Adin Hector, MD;  Location: [facility];  Service: General;  Laterality: N/A;  . LUMBAR LAMINECTOMY    . TONSILLECTOMY AND ADENOIDECTOMY    . TOTAL HIP ARTHROPLASTY  2005   L    Allergies  Allergen Reactions  . Latex Rash  . Tape Rash    Paper tape is ok to use     Outpatient Encounter Medications as of 06/10/2017  Medication Sig  . acetaminophen (TYLENOL) 325 MG tablet Take 2 tablets (650 mg total) by mouth every 6 (six) hours as needed for mild pain (or Fever >/= 101).  Marland Kitchen acetaminophen (TYLENOL) 500  MG tablet Take 1,000 mg by mouth 2 (two) times daily.  Marland Kitchen aspirin 81 MG tablet Take 81 mg by mouth every morning.   Marland Kitchen atorvastatin (LIPITOR) 20 MG tablet TAKE 1 TABLET BY MOUTH DAILY FOR CHOLESTEROL  . B Complex-C (B-COMPLEX WITH VITAMIN C) tablet Take 1 tablet by mouth daily.  . bimatoprost (LUMIGAN) 0.01 % SOLN Place 1 drop into both eyes at bedtime.  Marland Kitchen buPROPion (WELLBUTRIN XL) 150 MG 24 hr tablet Take 150 mg by mouth daily.   . Cholecalciferol (VITAMIN D3) 1000 units CAPS Take 2,000 Units by mouth daily.   . fluticasone (CUTIVATE) 0.05 % cream Apply 1 application topically 2 (two) times daily as needed (irritation).   Marland Kitchen HYDROcodone-acetaminophen (NORCO/VICODIN) 5-325 MG tablet Take 1 tablet by mouth every 8 (eight) hours as needed for moderate pain.  Marland Kitchen  ketoconazole (NIZORAL) 2 % cream Apply 1 application topically daily as needed for irritation.   Javier Docker Oil 500 MG CAPS Take 500 mg by mouth every morning.   Marland Kitchen levothyroxine (SYNTHROID, LEVOTHROID) 50 MCG tablet Take 50 mcg by mouth daily before breakfast. Take one tablet daily except on Sunday's take 1/2 tablet.  . linaclotide (LINZESS) 145 MCG CAPS capsule Take 1 capsule (145 mcg total) by mouth daily before breakfast.  . Melatonin 3 MG TABS Take 1 tablet by mouth at bedtime.  . mirtazapine (REMERON) 30 MG tablet Take 1 tablet (30 mg total) by mouth at bedtime.  . Multiple Vitamins-Minerals (ICAPS PO) Take 1 tablet by mouth daily.  . Nutritional Supplements (BOOST PO) Take 1 Can by mouth daily.  . polyethylene glycol (MIRALAX / GLYCOLAX) packet Take 17 g by mouth daily as needed for mild constipation.  . sertraline (ZOLOFT) 100 MG tablet Take 200 mg by mouth every morning.   . tamsulosin (FLOMAX) 0.4 MG CAPS capsule TAKE 1 CAPSULE (0.4 MG TOTAL) BY MOUTH DAILY.  Marland Kitchen topiramate (TOPAMAX) 25 MG tablet TAKE 1 TABLET BY MOUTH EVERY MORNING AND 2 TABLETS BY MOUTH EVERY EVENING  . zinc oxide 20 % ointment Apply 1 application topically as directed.  Apply three times daily and as needed  . [DISCONTINUED] levothyroxine (SYNTHROID) 50 MCG tablet Take 1/2 tablet by mouth on Sunday mornings and Take one tablet by mouth every other morning for thryoid.   No facility-administered encounter medications on file as of 06/10/2017.     Review of Systems  Constitutional: Negative for chills and fever.  HENT: Negative for congestion, postnasal drip, rhinorrhea, sinus pressure, sinus pain, sneezing and sore throat.   Eyes: Positive for discharge and redness. Negative for pain and itching.       Wears eye glasses   Respiratory: Negative for cough, chest tightness, shortness of breath and wheezing.   Cardiovascular: Negative for chest pain, palpitations and leg swelling.  Gastrointestinal: Negative for abdominal distention, abdominal pain, constipation, diarrhea, nausea and vomiting.  Skin: Negative for color change, pallor and rash.  Neurological: Negative for dizziness, light-headedness and headaches.  Psychiatric/Behavioral: Negative for agitation and sleep disturbance. The patient is not nervous/anxious.     Immunization History  Administered Date(s) Administered  . DTaP 11/05/2005  . Hepatitis B 1933/01/19  . Influenza Split 11/12/2011, 11/29/2013  . Influenza Whole 10/30/2007, 11/12/2008, 11/26/2009  . Influenza, High Dose Seasonal PF 12/31/2015, 11/24/2016  . PPD Test 06/23/2011  . Pneumococcal Conjugate-13 01/14/2016  . Pneumococcal Polysaccharide-23 02/16/1999  . Tdap 01/13/2017  . Varicella 11/09/2005  . Zoster 08/30/2006   Pertinent  Health Maintenance Due  Topic Date Due  . INFLUENZA VACCINE  09/15/2017  . PNA vac Low Risk Adult  Completed   Fall Risk  10/27/2016 06/09/2016 02/03/2016 10/28/2015 01/16/2015  Falls in the past year? Yes Yes Yes Yes Yes  Number falls in past yr: 2 or more 2 or more 2 or more 2 or more 2 or more  Comment - - 2 falls in last 2 months - -  Injury with Fall? Yes No Yes No (No Data)  Comment L hip -  scraps and bruises - head  Risk Factor Category  - - - - -  Risk for fall due to : - - - Impaired balance/gait -  Follow up - - - - -    Vitals:   06/10/17 1205  BP: 132/71  Pulse: (!) 54  Resp: 18  Temp: 97.8  F (36.6 C)  SpO2: 96%  Weight: 146 lb (66.2 kg)  Height: 6' (1.829 m)   Body mass index is 19.8 kg/m. Physical Exam  Constitutional: He is oriented to person, place, and time.  Tall built,thin elderly in no acute distress   HENT:  Head: Normocephalic.  Mouth/Throat: Oropharynx is clear and moist. No oropharyngeal exudate.  Eyes: Pupils are equal, round, and reactive to light. EOM are normal. Right eye exhibits no discharge. No scleral icterus.  Left eye conjunctiva redness and dry yellow crust drainage noted on eyelashes.    Neck: Normal range of motion. No JVD present. No thyromegaly present.  Cardiovascular: Normal rate, regular rhythm, normal heart sounds and intact distal pulses. Exam reveals no gallop and no friction rub.  No murmur heard. Pulmonary/Chest: Effort normal and breath sounds normal. No stridor. No respiratory distress. He has no wheezes. He has no rales.  Abdominal: Soft. Bowel sounds are normal. He exhibits no distension and no mass. There is no tenderness. There is no rebound and no guarding.  Lymphadenopathy:    He has no cervical adenopathy.  Neurological: He is oriented to person, place, and time. Gait abnormal.  Skin: Skin is warm and dry. No rash noted. No erythema. No pallor.  Psychiatric: He has a normal mood and affect. His behavior is normal.    Labs reviewed: Recent Labs    01/08/17 1356 01/10/17 1528 01/11/17 0532 01/24/17 04/14/17  NA 140 140 141 140 142  K 4.1 4.8 4.1 4.4 4.4  CL 111 112* 111  --   --   CO2 23 21* 25  --   --   GLUCOSE 95 87 92  --   --   BUN 35* 30* 28* 28* 34*  CREATININE 1.26* 1.13 1.07 1.2 1.26  CALCIUM 8.7* 8.8* 8.3*  --  8.8   Recent Labs    01/10/17 1528 01/11/17 0532 01/24/17 04/14/17  AST 18 16  12* 15  ALT 17 15* 10 15  ALKPHOS 38 38 114 40  BILITOT 0.7 0.7  --  0.4  PROT 6.2* 5.3*  --  5.4  ALBUMIN 3.2* 2.8*  --  3.3   Recent Labs    01/08/17 1356 01/10/17 1528 01/11/17 0532 01/24/17 04/14/17  WBC 6.6 6.9 6.3 6.3 5.7  NEUTROABS 4.6 5.3  --   --   --   HGB 12.4* 12.4* 11.9* 13.4* 14.0  HCT 38.6* 39.3 38.1* 42 42.1  MCV 96.5 97.3 96.2  --   --   PLT 141* 152 143* 239  --    Lab Results  Component Value Date   TSH 3.63 01/24/2017   Lab Results  Component Value Date   HGBA1C 5.3 07/31/2012   Lab Results  Component Value Date   CHOL 125 05/16/2017   HDL 68 11/15/2016   LDLCALC 74 05/16/2017   LDLDIRECT 132.5 09/08/2012   TRIG 88 05/16/2017   CHOLHDL 2.7 11/15/2016    Significant Diagnostic Results in last 30 days:  No results found.  Assessment/Plan  Bacterial conjunctivitis of left eye Afebrile.conjunctival redness and yellow crust drainage noted on eyelashes.cleanse left eye with warm water wash clothe three times daily.Apply Erythromycin ophthalmic ointment three times daily x 7 days.continue to monitor.  Family/ staff Communication: Reviewed plan of care with patient and facility Nurse.   Labs/tests ordered: None   Leyton Brownlee C Zurie Platas, NP

## 2017-06-13 ENCOUNTER — Non-Acute Institutional Stay (SKILLED_NURSING_FACILITY): Payer: Medicare Other | Admitting: Internal Medicine

## 2017-06-13 ENCOUNTER — Encounter: Payer: Self-pay | Admitting: Internal Medicine

## 2017-06-13 DIAGNOSIS — M4802 Spinal stenosis, cervical region: Secondary | ICD-10-CM

## 2017-06-13 DIAGNOSIS — E039 Hypothyroidism, unspecified: Secondary | ICD-10-CM | POA: Diagnosis not present

## 2017-06-13 DIAGNOSIS — H353290 Exudative age-related macular degeneration, unspecified eye, stage unspecified: Secondary | ICD-10-CM

## 2017-06-13 DIAGNOSIS — E43 Unspecified severe protein-calorie malnutrition: Secondary | ICD-10-CM

## 2017-06-13 DIAGNOSIS — G992 Myelopathy in diseases classified elsewhere: Secondary | ICD-10-CM

## 2017-06-13 DIAGNOSIS — I10 Essential (primary) hypertension: Secondary | ICD-10-CM | POA: Diagnosis not present

## 2017-06-13 NOTE — Progress Notes (Signed)
Location:  [facility] Room Number: 34 Place of Service:  SNF [phone]) Provider:  [name] MD  [name], MD  Patient Care Team: [name], MD as PCP - General (Internal Medicine) [name], MD as Consulting Physician (Urology) [name], MD as Consulting Physician (Neurology) [name], MD as Consulting Physician (Dermatology) [name], MD (Inactive) as Consulting Physician (Gastroenterology) [name], MD as Consulting Physician (Orthopedic Surgery) [name], MD as Consulting Physician (Cardiology) [name], MD as Consulting Physician (Ophthalmology) [name], MD as Referring Physician (Psychiatry) [name], MD as Consulting Physician (General Surgery) [name], MD as Consulting Physician (Orthopedic Surgery)  Extended Emergency Contact Information Primary Emergency Contact: Hubbard,[name] Address: [address]          [place], [place] [address] [place] Phone: [phone] Relation: Spouse  Code Status:  DNR  Goals of care: Advanced Directive information Advanced Directives 06/13/2017  Does Patient Have a Medical Advance Directive? Yes  Type of Paramedic of [place];Living will;Out of facility DNR (pink MOST or yellow form)  Does patient want to make changes to medical advance directive? No - Patient declined  Copy of [place] in Chart? Yes  Pre-existing out of facility DNR order (yellow form or pink MOST form) Pink MOST form placed in chart (order not valid for inpatient use)     Chief Complaint  Patient presents with  . Medical Management of Chronic Issues    Routine Visit     HPI:  Pt is a 84 y.o. male seen today for medical management of chronic diseases.  He mentions to be doing well. He complaints of occasional pain to left neck area mainly at night when he lies down. His pain  medication helps subside the pain. He is currently on antibiotic eye drop for left eye conjunctivitis. He is working with restorative therapy team. MMSE 05/27/17 is 24/30 indicating mild cognitive impairment. He denies any other health concern this visit. No acute concern from nursing. He has history of cervical myelopathy    Past Medical History:  Diagnosis Date  . Acute urinary retention 01/28/2013  . AKI (acute kidney injury) ([place]) 01/26/2013  . Altered mental status 09/09/2013  . Anemia    B12 deficiency  . ANXIETY 06/07/2007   Qualifier: Diagnosis of  By: Talbert Cage CMA ([place]), June    . ARTHRITIS 06/07/2007   Qualifier: Diagnosis of  By: Talbert Cage CMA ([place]), June    . Balance problems 04/05/2011  . Benign essential tremor   . Benign fibroma of prostate 10/11/2011  . Bilateral inguinal hernia 10/09/2012  . Bilateral leg edema 09/13/2012  . Bladder retention 02/05/2013  . Bradycardia, sinus 08/02/2012  . CAROTID ARTERY DISEASE 03/31/2007   Qualifier: Diagnosis of  By: Linna Darner MD, Gwyndolyn Saxon    . Carotid stenosis    s/p L CEA  . Cataract, nuclear 03/21/2014  . 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 Cervical spinal stenosis   . Cervical stenosis of spine   . Critical lower limb ischemia   . Degeneration macular 11/02/2011  . Depression    Dr Albertine Patricia  . Difficulty in walking(719.7) 04/05/2011  . ESOPHAGITIS 08/06/2002   Qualifier: Diagnosis of  By: Talbert Cage CMA ([place]), June    . Essential and other specified forms of tremor 04/18/2013  . Fall at home 01/06/2017   left hip pain  .  Falls   . Family history of colon cancer 07/24/2012  . Fecal impaction ([place]) 10/11/2012  . Femoral hernia, bilateral s/p lap repair 01/16/2013 01/16/2013  . Glaucoma, compensated 03/21/2014  . H/O cardiovascular stress test    a. 02/2003 -> no ischemia/infarct.  Marland Kitchen HAMMER TOE 04/23/2010   Qualifier: Diagnosis of  By: Linna Darner MD, Gwyndolyn Saxon    . Hematoma of leg   . Hip hematoma, right 09/09/2013  . History of shingles 2009  . HTN  (hypertension) 08/22/2012  . Hx of echocardiogram    Echocardiogram 08/01/12: Mild LVH, EF 55-60%, normal wall motion.  . Hydrocele 10/11/2011  . Hyperlipidemia   . 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 Hypothyroidism   . Macular degeneration disease   . Macular edema   . Migraines    Dr Jannifer Franklin  . MORTON'S NEUROMA, LEFT 08/22/2007   Qualifier: Diagnosis of  By: Linna Darner MD, Gwyndolyn Saxon    . PAD (peripheral artery disease) ([place])   . Peripheral neuropathy   . Personal history of colonic polyps 01/05/2013   2014 2 mm adenomatous polyp   . PREMATURE ATRIAL CONTRACTIONS 03/14/2006   Qualifier: Diagnosis of  By: Linna Darner MD, Gwendolyn Lima 12/16/2011  . SPINAL STENOSIS 03/14/2006   Qualifier: Diagnosis of  By: Linna Darner MD, William   Cervical spine    . Testosterone deficiency    Dr Lawerance Bach, [facility]  . Unspecified vitamin D deficiency 08/06/2012  . Urge incontinence 10/09/2012  . Urinary tract infection, site not specified 02/13/2013  . Weakness 08/02/2012   Past Surgical History:  Procedure Laterality Date  . APPENDECTOMY  1941  . BREAST CYST EXCISION Left 01/16/2013   Procedure: MASS EXCISION AXILLA;  Surgeon: Adin Hector, MD;  Location: [facility];  Service: General;  Laterality: Left;  . CAROTID ENDARTERECTOMY  2005    L  . CATARACT EXTRACTION Right   . COLONOSCOPY  2014   Dr. Deatra Ina  . EYE SURGERY Right 02/2012   retina  . INGUINAL HERNIA REPAIR Bilateral 01/16/2013   Procedure: LAPAROSCOPIC BILATERAL INGUINAL DIRECT AND INDIRECT, FEMORAL, AND OBTURATOR HERNIAS;  Surgeon: Adin Hector, MD;  Location: [facility];  Service: General;  Laterality: Bilateral;  . INSERTION OF MESH N/A 01/16/2013   Procedure: INSERTION OF MESH;  Surgeon: Adin Hector, MD;  Location: [facility];  Service: General;  Laterality: N/A;  . LUMBAR LAMINECTOMY    . TONSILLECTOMY AND ADENOIDECTOMY    . TOTAL HIP ARTHROPLASTY  2005   L    Allergies  Allergen Reactions  . Latex Rash  . Tape Rash    Paper tape is ok  to use     Outpatient Encounter Medications as of 06/13/2017  Medication Sig  . acetaminophen (TYLENOL) 325 MG tablet Take 2 tablets (650 mg total) by mouth every 6 (six) hours as needed for mild pain (or Fever >/= 101).  Marland Kitchen acetaminophen (TYLENOL) 500 MG tablet Take 1,000 mg by mouth 2 (two) times daily.  Marland Kitchen aspirin 81 MG tablet Take 81 mg by mouth every morning.   Marland Kitchen atorvastatin (LIPITOR) 20 MG tablet TAKE 1 TABLET BY MOUTH DAILY FOR CHOLESTEROL  . B Complex-C (B-COMPLEX WITH VITAMIN C) tablet Take 1 tablet by mouth daily.  . bimatoprost (LUMIGAN) 0.01 % SOLN Place 1 drop into both eyes at bedtime.  Marland Kitchen buPROPion (WELLBUTRIN XL) 150 MG 24 hr tablet Take 150 mg by mouth daily.   . Cholecalciferol (VITAMIN D3) 1000 units CAPS Take 2,000 Units by  mouth daily.   Marland Kitchen erythromycin ophthalmic ointment Place 1 application into the left eye 3 (three) times daily. Stop date 06/17/17  . fluticasone (CUTIVATE) 0.05 % cream Apply 1 application topically 2 (two) times daily as needed (irritation).   Marland Kitchen HYDROcodone-acetaminophen (NORCO/VICODIN) 5-325 MG tablet Take 1 tablet by mouth every 8 (eight) hours as needed for moderate pain.  Marland Kitchen ketoconazole (NIZORAL) 2 % cream Apply 1 application topically daily as needed for irritation.   Javier Docker Oil 500 MG CAPS Take 500 mg by mouth every morning.   Marland Kitchen levothyroxine (SYNTHROID, LEVOTHROID) 50 MCG tablet Take 50 mcg by mouth daily before breakfast. Take one tablet daily except on Sunday's take 1/2 tablet.  . linaclotide (LINZESS) 145 MCG CAPS capsule Take 1 capsule (145 mcg total) by mouth daily before breakfast.  . Melatonin 3 MG TABS Take 1 tablet by mouth at bedtime.  . mirtazapine (REMERON) 30 MG tablet Take 1 tablet (30 mg total) by mouth at bedtime.  . Multiple Vitamins-Minerals (ICAPS PO) Take 1 tablet by mouth daily.  . Nutritional Supplements (BOOST PO) Take 1 Can by mouth daily.  . polyethylene glycol (MIRALAX / GLYCOLAX) packet Take 17 g by mouth daily as needed  for mild constipation.  . sertraline (ZOLOFT) 100 MG tablet Take 200 mg by mouth every morning.   . tamsulosin (FLOMAX) 0.4 MG CAPS capsule TAKE 1 CAPSULE (0.4 MG TOTAL) BY MOUTH DAILY.  Marland Kitchen topiramate (TOPAMAX) 25 MG tablet TAKE 1 TABLET BY MOUTH EVERY MORNING AND 2 TABLETS BY MOUTH EVERY EVENING  . zinc oxide 20 % ointment Apply 1 application topically as directed. Apply three times daily and as needed   No facility-administered encounter medications on file as of 06/13/2017.     Review of Systems  Constitutional: Negative for appetite change, chills and fever.  HENT: Positive for hearing loss. Negative for congestion, mouth sores, sinus pressure, sore throat and trouble swallowing.   Respiratory: Negative for cough and shortness of breath.   Cardiovascular: Negative for chest pain, palpitations and leg swelling.  Gastrointestinal: Negative for abdominal pain, constipation, diarrhea, nausea and vomiting.  Genitourinary: Negative for dysuria, frequency and hematuria.  Musculoskeletal: Positive for back pain and gait problem.  Skin: Negative for rash.  Neurological: Negative for dizziness and headaches.  Psychiatric/Behavioral: Positive for confusion. Negative for behavioral problems.    Immunization History  Administered Date(s) Administered  . DTaP 11/05/2005  . Hepatitis B 10/[birth year]/34  . Influenza Split 11/12/2011, 11/29/2013  . Influenza Whole 10/30/2007, 11/12/2008, 11/26/2009  . Influenza, High Dose Seasonal PF 12/31/2015, 11/24/2016  . PPD Test 06/23/2011  . Pneumococcal Conjugate-13 01/14/2016  . Pneumococcal Polysaccharide-23 02/16/1999  . Tdap 01/13/2017  . Varicella 11/09/2005  . Zoster 08/30/2006   Pertinent  Health Maintenance Due  Topic Date Due  . INFLUENZA VACCINE  09/15/2017  . PNA vac Low Risk Adult  Completed   Fall Risk  10/27/2016 06/09/2016 02/03/2016 10/28/2015 01/16/2015  Falls in the past year? Yes Yes Yes Yes Yes  Number falls in past yr: 2 or more 2 or  more 2 or more 2 or more 2 or more  Comment - - 2 falls in last 2 months - -  Injury with Fall? Yes No Yes No (No Data)  Comment L hip - scraps and bruises - head  Risk Factor Category  - - - - -  Risk for fall due to : - - - Impaired balance/gait -  Follow up - - - - -  Functional Status Survey:    Vitals:   06/13/17 1152  BP: 132/71  Pulse: 65  Resp: 18  Temp: 97.8 F (36.6 C)  TempSrc: Oral  SpO2: 96%  Weight: 146 lb (66.2 kg)  Height: 6' (1.829 m)   Body mass index is 19.8 kg/m.   Wt Readings from Last 3 Encounters:  06/13/17 146 lb (66.2 kg)  06/10/17 146 lb (66.2 kg)  05/09/17 150 lb (68 kg)   Physical Exam  Constitutional: He is oriented to person, place, and time.  Thin built, frail, elderly male in no acute distress  HENT:  Head: Normocephalic and atraumatic.  Right Ear: External ear normal.  Left Ear: External ear normal.  Nose: Nose normal.  Mouth/Throat: Oropharynx is clear and moist. No oropharyngeal exudate.  Eyes: Pupils are equal, round, and reactive to light. Conjunctivae and EOM are normal. Right eye exhibits no discharge. Left eye exhibits no discharge.  Neck: Normal range of motion. Neck supple.  Cardiovascular: Normal rate, regular rhythm and intact distal pulses.  Pulmonary/Chest: Effort normal and breath sounds normal. He has no wheezes. He has no rales.  Abdominal: Soft. Bowel sounds are normal. He exhibits no mass. There is no tenderness.  Musculoskeletal: Normal range of motion. He exhibits no edema or deformity.  Able to move all 4 extremities, has arthritis changes to fingers, limited ROM to his hip with pain, no leg edema, ambulated with front wheel walker  Lymphadenopathy:    He has no cervical adenopathy.  Neurological: He is alert and oriented to person, place, and time.  Skin: Skin is warm and dry. He is not diaphoretic. No erythema.  Psychiatric: He has a normal mood and affect. His behavior is normal.    Labs reviewed: Recent  Labs    01/08/17 1356 01/10/17 1528 01/11/17 0532 01/24/17 04/14/17  NA 140 140 141 140 142  142  K 4.1 4.8 4.1 4.4 4.4  4.4  CL 111 112* 111  --   --   CO2 23 21* 25  --   --   GLUCOSE 95 87 92  --   --   BUN 35* 30* 28* 28* 34*  CREATININE 1.26* 1.13 1.07 1.2 1.3  1.26  CALCIUM 8.7* 8.8* 8.3*  --  8.8   Recent Labs    01/10/17 1528 01/11/17 0532 01/24/17 04/14/17  AST 18 16 12* 15  15  ALT 17 15* 10 15  15   ALKPHOS 38 38 114 40  40  BILITOT 0.7 0.7  --  0.4  PROT 6.2* 5.3*  --  5.4  ALBUMIN 3.2* 2.8*  --  3.3   Recent Labs    01/08/17 1356 01/10/17 1528 01/11/17 0532 01/24/17 04/14/17  WBC 6.6 6.9 6.3 6.3 5.7  5.7  NEUTROABS 4.6 5.3  --   --   --   HGB 12.4* 12.4* 11.9* 13.4* 14.0  14.0  HCT 38.6* 39.3 38.1* 42 42  42.1  MCV 96.5 97.3 96.2  --   --   PLT 141* 152 143* 239 134*   Lab Results  Component Value Date   TSH 3.63 01/24/2017   Lab Results  Component Value Date   HGBA1C 5.3 07/31/2012   Lab Results  Component Value Date   CHOL 125 05/16/2017   CHOL 125 05/16/2017   HDL 34 (A) 05/16/2017   LDLCALC 74 05/16/2017   LDLCALC 74 05/16/2017   LDLDIRECT 132.5 09/08/2012   TRIG 88 05/16/2017   TRIG 88 05/16/2017  CHOLHDL 2.7 11/15/2016    Significant Diagnostic Results in last 30 days:  No results found.  Assessment/Plan  1. Hypothyroidism, unspecified type Lab Results  Component Value Date   TSH 3.63 01/24/2017   Check TSH. Continue levothyroxine 50 mcg daily  2. Severe protein-calorie malnutrition (HCC) Low but stable weight. Continue nutritional supplement. Continue his remeron  3. Essential hypertension Overall stable BP reading, not on any antihypertensive at present. Continue aspirin.   4. Myelopathy concurrent with and due to spinal stenosis of cervical region [facility]) Reviewed OV note from Dr Kathyrn Sheriff from 03/16/17 with plan for symptomatic pain management. Currently on tylenol 1000 mg bid with 650 mg q6h prn pain and norco  5-325 mg 1 tab q8h prn pain. Mentions pain to be under control with this regimen. Continue this and monitor  5. Exudative age-related macular degeneration, unspecified laterality, unspecified stage ([place]) Continue his eye drops, supportive care.   6. Hyperlipidemia LDL at goal, continue atorvastatin 20 mg daily. Has history of CAD and age for risk factor.    Family/ staff Communication: reviewed care plan with patient and charge nurse.    Labs/tests ordered:  TSH   [name], MD Internal Medicine [facility] [address] [place], [place] [address] Cell Phone (Monday-Friday 8 am - 5 pm): [phone] On Call: [phone] and follow prompts after 5 pm and on weekends Office Phone: [phone] Office Fax: [phone]

## 2017-06-16 LAB — TSH: TSH: 1.87 (ref 0.41–5.90)

## 2017-07-06 ENCOUNTER — Encounter: Payer: Self-pay | Admitting: Family

## 2017-07-06 ENCOUNTER — Encounter: Payer: Self-pay | Admitting: Neurology

## 2017-07-06 ENCOUNTER — Ambulatory Visit (INDEPENDENT_AMBULATORY_CARE_PROVIDER_SITE_OTHER): Payer: Medicare Other | Admitting: Neurology

## 2017-07-06 ENCOUNTER — Non-Acute Institutional Stay (SKILLED_NURSING_FACILITY): Payer: Medicare Other | Admitting: Family

## 2017-07-06 VITALS — BP 149/69 | HR 63 | Ht 72.0 in | Wt 150.5 lb

## 2017-07-06 DIAGNOSIS — F329 Major depressive disorder, single episode, unspecified: Secondary | ICD-10-CM

## 2017-07-06 DIAGNOSIS — E039 Hypothyroidism, unspecified: Secondary | ICD-10-CM

## 2017-07-06 DIAGNOSIS — K5901 Slow transit constipation: Secondary | ICD-10-CM | POA: Diagnosis not present

## 2017-07-06 DIAGNOSIS — G992 Myelopathy in diseases classified elsewhere: Secondary | ICD-10-CM | POA: Diagnosis not present

## 2017-07-06 DIAGNOSIS — R2681 Unsteadiness on feet: Secondary | ICD-10-CM

## 2017-07-06 DIAGNOSIS — M4802 Spinal stenosis, cervical region: Secondary | ICD-10-CM

## 2017-07-06 DIAGNOSIS — I739 Peripheral vascular disease, unspecified: Secondary | ICD-10-CM

## 2017-07-06 DIAGNOSIS — G250 Essential tremor: Secondary | ICD-10-CM | POA: Diagnosis not present

## 2017-07-06 DIAGNOSIS — R2689 Other abnormalities of gait and mobility: Secondary | ICD-10-CM

## 2017-07-06 NOTE — Progress Notes (Signed)
Reason for visit: Essential tremor, gait disorder  Jose Leach is an 84 y.o. male  History of present illness:  Jose. Leach is an 84 year old right-handed white male with a history of an essential tremor.  The patient has had a chronic gait disorder as well, he has fallen on occasion, he fell on 10 January 2017 and record his left acetabulum.  The patient has recovered from this but still has some pain in the left hip with sitting.  The patient was referred to neurosurgery, he was seen previously and they discussed the possibility of decompressive surgery.  The patient has evidence of significant spinal stenosis involving the C4-5 and C5-6 levels, the patient has right lateral spinal cord increased signal.  The patient does have some urinary incontinence at nighttime, not during the day.  He is walking with a walker, he denies any further falls since November.  He denies any numbness of the extremities.  He denies any significant neck pain but he does have a fullness or swelling sensation in the neck.  He returns to the office today for an evaluation.  Past Medical History:  Diagnosis Date  . Acute urinary retention 01/28/2013  . AKI (acute kidney injury) ([place]) 01/26/2013  . Altered mental status 09/09/2013  . Anemia    B12 deficiency  . ANXIETY 06/07/2007   Qualifier: Diagnosis of  By: Talbert Cage CMA ([place]), June    . ARTHRITIS 06/07/2007   Qualifier: Diagnosis of  By: Talbert Cage CMA ([place]), June    . Balance problems 04/05/2011  . Benign essential tremor   . Benign fibroma of prostate 10/11/2011  . Bilateral inguinal hernia 10/09/2012  . Bilateral leg edema 09/13/2012  . Bladder retention 02/05/2013  . Bradycardia, sinus 08/02/2012  . CAROTID ARTERY DISEASE 03/31/2007   Qualifier: Diagnosis of  By: Linna Darner MD, Gwyndolyn Saxon    . Carotid stenosis    s/p L CEA  . Cataract, nuclear 03/21/2014  . 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 Cervical spinal stenosis   . Cervical stenosis of spine   .  Critical lower limb ischemia   . Degeneration macular 11/02/2011  . Depression    Dr Albertine Patricia  . Difficulty in walking(719.7) 04/05/2011  . ESOPHAGITIS 08/06/2002   Qualifier: Diagnosis of  By: Talbert Cage CMA ([place]), June    . Essential and other specified forms of tremor 04/18/2013  . Fall at home 01/06/2017   left hip pain  . Falls   . Family history of colon cancer 07/24/2012  . Fecal impaction ([place]) 10/11/2012  . Femoral hernia, bilateral s/p lap repair 01/16/2013 01/16/2013  . Glaucoma, compensated 03/21/2014  . H/O cardiovascular stress test    a. 02/2003 -> no ischemia/infarct.  Marland Kitchen HAMMER TOE 04/23/2010   Qualifier: Diagnosis of  By: Linna Darner MD, Gwyndolyn Saxon    . Hematoma of leg   . Hip hematoma, right 09/09/2013  . History of shingles 2009  . HTN (hypertension) 08/22/2012  . Hx of echocardiogram    Echocardiogram 08/01/12: Mild LVH, EF 55-60%, normal wall motion.  . Hydrocele 10/11/2011  . Hyperlipidemia   . 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 Hypothyroidism   . Macular degeneration disease   . Macular edema   . Migraines    Dr Jannifer Franklin  . MORTON'S NEUROMA, LEFT 08/22/2007   Qualifier: Diagnosis of  By: Linna Darner MD, Gwyndolyn Saxon    . PAD (peripheral artery disease) ([place])   . Peripheral neuropathy   . Personal history of colonic polyps 01/05/2013   2014 2  mm adenomatous polyp   . PREMATURE ATRIAL CONTRACTIONS 03/14/2006   Qualifier: Diagnosis of  By: Linna Darner MD, Gwendolyn Lima 12/16/2011  . SPINAL STENOSIS 03/14/2006   Qualifier: Diagnosis of  By: Linna Darner MD, William   Cervical spine    . Testosterone deficiency    Dr Lawerance Bach, [facility]  . Unspecified vitamin D deficiency 08/06/2012  . Urge incontinence 10/09/2012  . Urinary tract infection, site not specified 02/13/2013  . Weakness 08/02/2012    Past Surgical History:  Procedure Laterality Date  . APPENDECTOMY  1941  . BREAST CYST EXCISION Left 01/16/2013   Procedure: MASS EXCISION AXILLA;  Surgeon: Adin Hector, MD;  Location: [facility];   Service: General;  Laterality: Left;  . CAROTID ENDARTERECTOMY  2005    L  . CATARACT EXTRACTION Right   . COLONOSCOPY  2014   Dr. Deatra Ina  . EYE SURGERY Right 02/2012   retina  . INGUINAL HERNIA REPAIR Bilateral 01/16/2013   Procedure: LAPAROSCOPIC BILATERAL INGUINAL DIRECT AND INDIRECT, FEMORAL, AND OBTURATOR HERNIAS;  Surgeon: Adin Hector, MD;  Location: [facility];  Service: General;  Laterality: Bilateral;  . INSERTION OF MESH N/A 01/16/2013   Procedure: INSERTION OF MESH;  Surgeon: Adin Hector, MD;  Location: [facility];  Service: General;  Laterality: N/A;  . LUMBAR LAMINECTOMY    . TONSILLECTOMY AND ADENOIDECTOMY    . TOTAL HIP ARTHROPLASTY  2005   L    Family History  Problem Relation Age of Onset  . Stroke Mother 37  . Hypertension Mother   . Diabetes Mother   . Heart disease Mother   . Rectal cancer Father 28  . Heart disease Father        in 20s  . Cancer Father        colorectal  . Nephritis Sister   . Seizures Brother        died as child    Social history:  reports that he quit smoking about 38 years ago. His smoking use included pipe and cigars. He quit after 10.00 years of use. He has never used smokeless tobacco. He reports that he does not drink alcohol or use drugs.    Allergies  Allergen Reactions  . Latex Rash  . Tape Rash    Paper tape is ok to use     Medications:  Prior to Admission medications   Medication Sig Start Date End Date Taking? Authorizing Provider  acetaminophen (TYLENOL) 325 MG tablet Take 2 tablets (650 mg total) by mouth every 6 (six) hours as needed for mild pain (or Fever >/= 101). 01/12/17  Yes Bonnielee Haff, MD  acetaminophen (TYLENOL) 500 MG tablet Take 1,000 mg by mouth 2 (two) times daily.   Yes [provider]  aspirin 81 MG tablet Take 81 mg by mouth every morning.    Yes [provider]  atorvastatin (LIPITOR) 20 MG tablet TAKE 1 TABLET BY MOUTH DAILY FOR CHOLESTEROL 04/27/17  Yes Pandey, Mahima, MD  B  Complex-C (B-COMPLEX WITH VITAMIN C) tablet Take 1 tablet by mouth daily.   Yes [provider]  bimatoprost (LUMIGAN) 0.01 % SOLN Place 1 drop into both eyes at bedtime.   Yes [provider]  buPROPion (WELLBUTRIN XL) 150 MG 24 hr tablet Take 150 mg by mouth daily.  05/22/16  Yes [provider]  Cholecalciferol (VITAMIN D3) 1000 units CAPS Take 2,000 Units by mouth daily.    Yes [provider]  fluticasone (CUTIVATE) 0.05 % cream Apply 1 application topically 2 (two) times daily as needed (irritation).  05/15/16  Yes [provider]  HYDROcodone-acetaminophen (NORCO/VICODIN) 5-325 MG tablet Take 1 tablet by mouth every 8 (eight) hours as needed for moderate pain.   Yes [provider]  ketoconazole (NIZORAL) 2 % cream Apply 1 application topically daily as needed for irritation.  05/15/16  Yes [provider]  Javier Docker Oil 500 MG CAPS Take 500 mg by mouth every morning.    Yes [provider]  levothyroxine (SYNTHROID, LEVOTHROID) 50 MCG tablet Take 50 mcg by mouth daily before breakfast. Take one tablet daily except on Sunday's take 1/2 tablet.   Yes [provider]  linaclotide Rolan Lipa) 145 MCG CAPS capsule Take 1 capsule (145 mcg total) by mouth daily before breakfast. 09/01/16  Yes [name], MD  Melatonin 3 MG TABS Take 1 tablet by mouth at bedtime.   Yes [provider]  mirtazapine (REMERON) 30 MG tablet Take 1 tablet (30 mg total) by mouth at bedtime. 01/12/17  Yes Bonnielee Haff, MD  Multiple Vitamins-Minerals (ICAPS PO) Take 1 tablet by mouth daily.   Yes [provider]  Nutritional Supplements (BOOST PO) Take 1 Can by mouth daily.   Yes [provider]  polyethylene glycol (MIRALAX / GLYCOLAX) packet Take 17 g by mouth daily as needed for mild constipation.   Yes [provider]  sertraline (ZOLOFT) 100 MG tablet Take 200 mg by mouth every morning.    Yes [provider]  tamsulosin (FLOMAX) 0.4 MG CAPS capsule TAKE 1 CAPSULE (0.4 MG TOTAL) BY MOUTH DAILY. 05/24/14  Yes [provider]  topiramate (TOPAMAX) 25 MG tablet TAKE 1 TABLET BY MOUTH EVERY MORNING AND 2 TABLETS BY MOUTH EVERY EVENING 01/23/17  Yes [name], MD  zinc oxide 20 % ointment Apply 1 application topically as directed. Apply three times daily and as needed   Yes [provider]    ROS:  Out of a complete 14 system review of symptoms, the patient complains only of the following symptoms, and all other reviewed systems are negative.  Decreased appetite, fatigue Hearing loss Light sensitivity, loss of vision Walking problems  Blood pressure (!) 149/69, pulse 63, height 6' (1.829 m), weight 150 lb 8 oz (68.3 kg).  Physical Exam  General: The patient is alert and cooperative at the time of the examination.  Skin: 2+ edema below the knees is seen bilaterally.   Neurologic Exam  Mental status: The patient is alert and oriented x 3 at the time of the examination. The patient has apparent normal recent and remote memory, with an apparently normal attention span and concentration ability.   Cranial nerves: Facial symmetry is present. Speech is normal, no aphasia or dysarthria is noted. Extraocular movements are full. Visual fields are full.  Motor: The patient has good strength in all 4 extremities.  Sensory examination: Soft touch sensation is symmetric on the face, arms, and legs.  Coordination: The patient has good finger-nose-finger and heel-to-shin bilaterally.  The patient does have some mild tremor with finger-nose-finger bilaterally.  Gait and station: The patient has a minimally wide-based gait, the patient is a slow deliberate gait using a walker.  Tandem gait was not attempted.  Romberg is negative. No drift is seen.  Reflexes: Deep tendon reflexes are symmetric.   MRI cervical 01/10/17:  IMPRESSION: 1. No acute osseous abnormality  or findings of ligamentous injury. 2. 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 3. 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 4. Stable severe C4-5 and C5-6 canal stenosis. Moderate C2-3, C3-4, C6-7 canal stenosis. 5. Stable multiple levels of high-grade foraminal stenosis as above.  * MRI scan images were reviewed online. I agree with the written report.    Assessment/Plan:  1.  Cervical spinal stenosis, C4-5 and C5-6 levels  2.  Chronic gait disorder  3.  Benign essential tremor  The patient will be sent back for a visit with his neurosurgeon.  The patient seems amenable to the possibility of cervical spine surgery.  He will follow-up through this office in about 6 months.  His essential tremor actually appears to be better today than I have seen in the past.  C. Floyde Parkins MD 07/06/2017 12:02 PM  [facility] [address] [place], [place] [address]  Phone [phone] Fax [phone]

## 2017-07-06 NOTE — Progress Notes (Signed)
Location:  [facility] Room Number: 34 Place of Service:  SNF [phone]) Provider: Kj Imbert FNP-C   [name], MD  Patient Care Team: [name], MD as PCP - General (Internal Medicine) [name], MD as Consulting Physician (Urology) [name], MD as Consulting Physician (Neurology) [name], MD as Consulting Physician (Dermatology) [name], MD (Inactive) as Consulting Physician (Gastroenterology) [name], MD as Consulting Physician (Orthopedic Surgery) [name], MD as Consulting Physician (Cardiology) [name], MD as Consulting Physician (Ophthalmology) [name], MD as Referring Physician (Psychiatry) [name], MD as Consulting Physician (General Surgery) [name], MD as Consulting Physician (Orthopedic Surgery) Shrita Thien, [name], NP as Nurse Practitioner (Family Medicine)  Extended Emergency Contact Information Primary Emergency Contact: Randol,[name] Address: [address]          [place], [place] [address] [place] Phone: [phone] Relation: Spouse  Code Status:  DNR Goals of care: Advanced Directive information Advanced Directives 07/06/2017  Does Patient Have a Medical Advance Directive? Yes  Type of Paramedic of [place];Out of facility DNR (pink MOST or yellow form);Living will  Does patient want to make changes to medical advance directive? -  Copy of [place] in Chart? Yes  Pre-existing out of facility DNR order (yellow form or pink MOST form) Yellow form placed in chart (order not valid for inpatient use);Pink MOST form placed in chart (order not valid for inpatient use)     Chief Complaint  Patient presents with  . Medical Management of Chronic Issues    HPI:  Pt is a 84 y.o. 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     Past Medical History:  Diagnosis Date  . Acute urinary retention 01/28/2013  . AKI (acute kidney injury) ([place]) 01/26/2013  . Altered mental status 09/09/2013  . Anemia    B12 deficiency  . ANXIETY 06/07/2007   Qualifier: Diagnosis of  By: Talbert Cage CMA ([place]), June    . ARTHRITIS 06/07/2007   Qualifier: Diagnosis of  By: Talbert Cage CMA ([place]), June    . Balance problems 04/05/2011  . Benign essential tremor   . Benign fibroma of prostate 10/11/2011  . Bilateral inguinal hernia 10/09/2012  . Bilateral leg edema 09/13/2012  . Bladder retention 02/05/2013  . Bradycardia, sinus 08/02/2012  . CAROTID ARTERY DISEASE 03/31/2007   Qualifier: Diagnosis of  By: Linna Darner MD, Gwyndolyn Saxon    . Carotid stenosis    s/p L CEA  . Cataract, nuclear 03/21/2014  . 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 Cervical spinal stenosis   . Cervical stenosis of spine   . Critical lower limb ischemia   . Degeneration macular 11/02/2011  . Depression    Dr Albertine Patricia  . Difficulty in walking(719.7) 04/05/2011  . ESOPHAGITIS 08/06/2002   Qualifier: Diagnosis of  By: Talbert Cage CMA ([place]), June    . Essential and other specified forms of tremor 04/18/2013  . Fall at home 01/06/2017  left hip pain  . Falls   . Family history of colon cancer 07/24/2012  . Fecal impaction ([facility]) 10/11/2012  . Femoral hernia, bilateral s/p lap repair 01/16/2013 01/16/2013  . Glaucoma, compensated 03/21/2014  . H/O cardiovascular stress test    a. 02/2003 -> no ischemia/infarct.  Marland Kitchen HAMMER TOE 04/23/2010    Qualifier: Diagnosis of  By: Linna Darner MD, Gwyndolyn Saxon    . Hematoma of leg   . Hip hematoma, right 09/09/2013  . History of shingles 2009  . HTN (hypertension) 08/22/2012  . Hx of echocardiogram    Echocardiogram 08/01/12: Mild LVH, EF 55-60%, normal wall motion.  . Hydrocele 10/11/2011  . Hyperlipidemia   . 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 Hypothyroidism   . Macular degeneration disease   . Macular edema   . Migraines    Dr Jannifer Franklin  . MORTON'S NEUROMA, LEFT 08/22/2007   Qualifier: Diagnosis of  By: Linna Darner MD, Gwyndolyn Saxon    . PAD (peripheral artery disease) ([facility])   . Peripheral neuropathy   . Personal history of colonic polyps 01/05/2013   2014 2 mm adenomatous polyp   . PREMATURE ATRIAL CONTRACTIONS 03/14/2006   Qualifier: Diagnosis of  By: Linna Darner MD, Gwendolyn Lima 12/16/2011  . SPINAL STENOSIS 03/14/2006   Qualifier: Diagnosis of  By: Linna Darner MD, William   Cervical spine    . Testosterone deficiency    Dr Lawerance Bach, [facility]  . Unspecified vitamin D deficiency 08/06/2012  . Urge incontinence 10/09/2012  . Urinary tract infection, site not specified 02/13/2013  . Weakness 08/02/2012   Past Surgical History:  Procedure Laterality Date  . APPENDECTOMY  1941  . BREAST CYST EXCISION Left 01/16/2013   Procedure: MASS EXCISION AXILLA;  Surgeon: Adin Hector, MD;  Location: [facility];  Service: General;  Laterality: Left;  . CAROTID ENDARTERECTOMY  2005    L  . CATARACT EXTRACTION Right   . COLONOSCOPY  2014   Dr. Deatra Ina  . EYE SURGERY Right 02/2012   retina  . INGUINAL HERNIA REPAIR Bilateral 01/16/2013   Procedure: LAPAROSCOPIC BILATERAL INGUINAL DIRECT AND INDIRECT, FEMORAL, AND OBTURATOR HERNIAS;  Surgeon: Adin Hector, MD;  Location: [facility];  Service: General;  Laterality: Bilateral;  . INSERTION OF MESH N/A 01/16/2013   Procedure: INSERTION OF MESH;  Surgeon: Adin Hector, MD;  Location: [facility];  Service: General;  Laterality: N/A;  . LUMBAR LAMINECTOMY    . TONSILLECTOMY AND  ADENOIDECTOMY    . TOTAL HIP ARTHROPLASTY  2005   L    Allergies  Allergen Reactions  . Latex Rash  . Tape Rash    Paper tape is ok to use     Allergies as of 07/06/2017      Reactions   Latex Rash   Tape Rash   Paper tape is ok to use      Medication List        Accurate as of 07/06/17 10:22 PM. Always use your most recent med list.          acetaminophen 500 MG tablet Commonly known as:  TYLENOL Take 1,000 mg by mouth 2 (two) times daily.   acetaminophen 325 MG tablet Commonly known as:  TYLENOL Take 2 tablets (650 mg total) by mouth every 6 (six) hours as needed for mild pain (or Fever >/= 101).   aspirin 81 MG tablet Take 81 mg by mouth every morning.   atorvastatin 20 MG tablet Commonly known as:  LIPITOR  TAKE 1 TABLET BY MOUTH DAILY FOR CHOLESTEROL   B-complex with vitamin C tablet Take 1 tablet by mouth daily.   bimatoprost 0.01 % Soln Commonly known as:  LUMIGAN Place 1 drop into both eyes at bedtime.   BOOST PO Take 1 Can by mouth daily.   buPROPion 150 MG 24 hr tablet Commonly known as:  WELLBUTRIN XL Take 150 mg by mouth daily.   fluticasone 0.05 % cream Commonly known as:  CUTIVATE Apply 1 application topically 2 (two) times daily as needed (irritation).   HYDROcodone-acetaminophen 5-325 MG tablet Commonly known as:  NORCO/VICODIN Take 1 tablet by mouth every 8 (eight) hours as needed for moderate pain.   ICAPS PO Take 1 tablet by mouth daily.   ketoconazole 2 % cream Commonly known as:  NIZORAL Apply 1 application topically daily as needed for irritation.   Krill Oil 500 MG Caps Take 500 mg by mouth every morning.   levothyroxine 50 MCG tablet Commonly known as:  SYNTHROID, LEVOTHROID Take 50 mcg by mouth daily before breakfast. Take one tablet daily except on Sunday's take 1/2 tablet.   linaclotide 145 MCG Caps capsule Commonly known as:  LINZESS Take 1 capsule (145 mcg total) by mouth daily before breakfast.   Melatonin 3  MG Tabs Take 1 tablet by mouth at bedtime.   mirtazapine 30 MG tablet Commonly known as:  REMERON Take 1 tablet (30 mg total) by mouth at bedtime.   polyethylene glycol packet Commonly known as:  MIRALAX / GLYCOLAX Take 17 g by mouth daily as needed for mild constipation.   sertraline 100 MG tablet Commonly known as:  ZOLOFT Take 200 mg by mouth every morning.   tamsulosin 0.4 MG Caps capsule Commonly known as:  FLOMAX TAKE 1 CAPSULE (0.4 MG TOTAL) BY MOUTH DAILY.   topiramate 25 MG tablet Commonly known as:  TOPAMAX TAKE 1 TABLET BY MOUTH EVERY MORNING AND 2 TABLETS BY MOUTH EVERY EVENING   Vitamin D3 1000 units Caps Take 2,000 Units by mouth daily.   zinc oxide 20 % ointment Apply 1 application topically as directed. Apply three times daily and as needed       Review of Systems  Constitutional: Negative for chills, fatigue, fever and unexpected weight change.  HENT: Negative for congestion, rhinorrhea, sinus pressure, sinus pain, sneezing and sore throat.   Eyes: Positive for visual disturbance. Negative for discharge, redness and itching.  Respiratory: Negative for cough, chest tightness, shortness of breath and wheezing.   Cardiovascular: Negative for chest pain, palpitations and leg swelling.  Gastrointestinal: Negative for abdominal distention, abdominal pain, constipation, diarrhea, nausea and vomiting.  Endocrine: Negative for cold intolerance, heat intolerance, polydipsia, polyphagia and polyuria.  Genitourinary: Negative for dysuria, flank pain, frequency and urgency.  Musculoskeletal: Positive for arthralgias and gait problem.  Skin: Negative for color change, pallor, rash and wound.  Neurological: Negative for dizziness, light-headedness and headaches.  Hematological: Does not bruise/bleed easily.  Psychiatric/Behavioral: Negative for agitation, confusion and sleep disturbance. The patient is not nervous/anxious.     Immunization History  Administered  Date(s) Administered  . DTaP 11/05/2005  . Hepatitis B 17-May-1932  . Influenza Split 11/12/2011, 11/29/2013  . Influenza Whole 10/30/2007, 11/12/2008, 11/26/2009  . Influenza, High Dose Seasonal PF 12/31/2015, 11/24/2016  . PPD Test 06/23/2011  . Pneumococcal Conjugate-13 01/14/2016  . Pneumococcal Polysaccharide-23 02/16/1999  . Tdap 01/13/2017  . Varicella 11/09/2005  . Zoster 08/30/2006   Pertinent  Health Maintenance Due  Topic Date Due  .  INFLUENZA VACCINE  09/15/2017  . PNA vac Low Risk Adult  Completed   Fall Risk  10/27/2016 06/09/2016 02/03/2016 10/28/2015 01/16/2015  Falls in the past year? Yes Yes Yes Yes Yes  Number falls in past yr: 2 or more 2 or more 2 or more 2 or more 2 or more  Comment - - 2 falls in last 2 months - -  Injury with Fall? Yes No Yes No (No Data)  Comment L hip - scraps and bruises - head  Risk Factor Category  - - - - -  Risk for fall due to : - - - Impaired balance/gait -  Follow up - - - - -    Vitals:   07/06/17 1125  BP: (!) 150/77  Pulse: (!) 52  Resp: 18  Temp: (!) 97.4 F (36.3 C)  SpO2: 93%  Weight: 146 lb (66.2 kg)  Height: 6' (1.829 m)   Body mass index is 19.8 kg/m. Physical Exam  Constitutional: He is oriented to person, place, and time.  Tall thin built elderly in no acute distress   HENT:  Head: Normocephalic.  Right Ear: External ear normal.  Left Ear: External ear normal.  Mouth/Throat: Oropharynx is clear and moist. No oropharyngeal exudate.  Eyes: Conjunctivae and EOM are normal. Right eye exhibits no discharge. Left eye exhibits no discharge. No scleral icterus.  Visual impaired  Neck: No JVD present. No thyromegaly present.  Cardiovascular: Normal rate, regular rhythm, normal heart sounds and intact distal pulses. Exam reveals no gallop and no friction rub.  No murmur heard. Pulmonary/Chest: Effort normal and breath sounds normal. No respiratory distress. He has no wheezes. He has no rales.  Abdominal: Soft.  Bowel sounds are normal. He exhibits no distension and no mass. There is no tenderness. There is no rebound and no guarding.  Musculoskeletal: He exhibits no edema or tenderness.  Moves x 4 extremities unsteady gait uses FWW short distance and wheelchair.   Lymphadenopathy:    He has no cervical adenopathy.  Neurological: He is oriented to person, place, and time. He displays tremor. Gait abnormal.  Skin: Skin is warm and dry. No rash noted. No erythema. No pallor.  Psychiatric: He has a normal mood and affect. His behavior is normal.    Labs reviewed: Recent Labs    01/08/17 1356 01/10/17 1528 01/11/17 0532 01/24/17 04/14/17  NA 140 140 141 140 142  142  K 4.1 4.8 4.1 4.4 4.4  4.4  CL 111 112* 111  --   --   CO2 23 21* 25  --   --   GLUCOSE 95 87 92  --   --   BUN 35* 30* 28* 28* 34*  CREATININE 1.26* 1.13 1.07 1.2 1.3  1.26  CALCIUM 8.7* 8.8* 8.3*  --  8.8   Recent Labs    01/10/17 1528 01/11/17 0532 01/24/17 04/14/17  AST 18 16 12* 15  15  ALT 17 15* 10 15  15   ALKPHOS 38 38 114 40  40  BILITOT 0.7 0.7  --  0.4  PROT 6.2* 5.3*  --  5.4  ALBUMIN 3.2* 2.8*  --  3.3   Recent Labs    01/08/17 1356 01/10/17 1528 01/11/17 0532 01/24/17 04/14/17  WBC 6.6 6.9 6.3 6.3 5.7  5.7  NEUTROABS 4.6 5.3  --   --   --   HGB 12.4* 12.4* 11.9* 13.4* 14.0  14.0  HCT 38.6* 39.3 38.1* 42 42  42.1  MCV 96.5 97.3 96.2  --   --   PLT 141* 152 143* 239 134*   Lab Results  Component Value Date   TSH 1.87 06/16/2017   Lab Results  Component Value Date   HGBA1C 5.3 07/31/2012   Lab Results  Component Value Date   CHOL 125 05/16/2017   CHOL 125 05/16/2017   HDL 34 (A) 05/16/2017   LDLCALC 74 05/16/2017   LDLCALC 74 05/16/2017   LDLDIRECT 132.5 09/08/2012   TRIG 88 05/16/2017   TRIG 88 05/16/2017   CHOLHDL 2.7 11/15/2016    Significant Diagnostic Results in last 30 days:  No results found.  Assessment/Plan 1. PAD (peripheral artery disease)  No edema or  ulceration.continue on Asprin 81 mg tablet daily and atorvastatin 20 mg tablet daily.continue to monitor.  2. Hypothyroidism Lab Results  Component Value Date   TSH 1.87 06/16/2017  Continue on levothyroxine 50 mcg tablet daily.monitor TSH level.  3. Slow transit constipation Current regimen effective.continue to encourage oral intake and hydration.  4. Myelopathy concurrent with and due to spinal stenosis of cervical region No numbness or tingling sensation.continues to walk with therapy.Has upcoming appointment with Neuro today.continue to monitor.   5. Unsteady gait Has improved with therapy.Up with walker for short distance and uses wheelchair for long distance. Continue fall and safety precautions.   6. Major depression, chronic Mood stable.appetite described as fair for lunch and supper but enjoys breakfast.continue on Remeron 30 mg tablet at bedtime,sertraline 200 mg tablet  and bupropion 150 mg tablet daily.Continue to monitor for mood changes.   Family/ staff Communication: Reviewed plan of care with patient and facility Nurse.    Labs/tests ordered: None   Karenann Mcgrory C Effie Wahlert, NP

## 2017-07-15 ENCOUNTER — Encounter: Payer: Self-pay | Admitting: Family

## 2017-07-15 ENCOUNTER — Non-Acute Institutional Stay (SKILLED_NURSING_FACILITY): Payer: Medicare Other | Admitting: Family

## 2017-07-15 DIAGNOSIS — J029 Acute pharyngitis, unspecified: Secondary | ICD-10-CM | POA: Diagnosis not present

## 2017-07-15 NOTE — Progress Notes (Signed)
Location:  [facility] Room Number: 34 Place of Service:  SNF [phone]) Provider: Dinah [name] FNP-C  [name], MD  Patient Care Team: [name], MD as PCP - General (Internal Medicine) [name], MD as Consulting Physician (Urology) [name], MD as Consulting Physician (Neurology) [name], MD as Consulting Physician (Dermatology) [name], MD (Inactive) as Consulting Physician (Gastroenterology) [name], MD as Consulting Physician (Orthopedic Surgery) [name], MD as Consulting Physician (Cardiology) [name], MD as Consulting Physician (Ophthalmology) [name], MD as Referring Physician (Psychiatry) [name], MD as Consulting Physician (General Surgery) [name], MD as Consulting Physician (Orthopedic Surgery) [name], [name], NP as Nurse Practitioner (Family Medicine)  Extended Emergency Contact Information Primary Emergency Contact: Mroczkowski,[name] Address: [place]          [address]          [place], [place] [address] [place] of  Phone: [phone] Relation: Spouse  Code Status:  DNR Goals of care: Advanced Directive information Advanced Directives 07/15/2017  Does Patient Have a Medical Advance Directive? Yes  Type of Paramedic of [place];Out of facility DNR (pink MOST or yellow form);Living will  Does patient want to make changes to medical advance directive? -  Copy of [place] in Chart? Yes  Pre-existing out of facility DNR order (yellow form or pink MOST form) Yellow form placed in chart (order not valid for inpatient use);Pink MOST form placed in chart (order not valid for inpatient use)     Chief Complaint  Patient presents with  . Acute Visit    sore throat x2 weeks    HPI:  Pt is a 84 y.o. male seen today at [facility] for an acute visit for evaluation of sore throat x 2 weeks.He is  seen in his room today per facility Nurse request.Patient states has had sore throat for the past 1-2 weeks but seems to be worst today.He describes sore throat as painful to swallow.He denies any fever,chills or cough.     Past Medical History:  Diagnosis Date  . Acute urinary retention 01/28/2013  . AKI (acute kidney injury) ([place]) 01/26/2013  . Altered mental status 09/09/2013  . Anemia    B12 deficiency  . ANXIETY 06/07/2007   Qualifier: Diagnosis of  By: Talbert Cage CMA ([place]), June    . ARTHRITIS 06/07/2007   Qualifier: Diagnosis of  By: Talbert Cage CMA ([place]), June    . Balance problems 04/05/2011  . Benign essential tremor   . Benign fibroma of prostate 10/11/2011  . Bilateral inguinal hernia 10/09/2012  . Bilateral leg edema 09/13/2012  . Bladder retention 02/05/2013  . Bradycardia, sinus 08/02/2012  . CAROTID ARTERY DISEASE 03/31/2007   Qualifier: Diagnosis of  By: Linna Darner MD, Gwyndolyn Saxon    . Carotid stenosis    s/p L CEA  . Cataract, nuclear 03/21/2014  . 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 Cervical spinal stenosis   . Cervical stenosis of spine   . Critical lower limb ischemia   . Degeneration macular 11/02/2011  . Depression    Dr Albertine Patricia  . Difficulty in walking(719.7) 04/05/2011  . ESOPHAGITIS 08/06/2002   Qualifier: Diagnosis of  By: Talbert Cage CMA ([place]), June    . Essential and other specified forms of tremor 04/18/2013  . Fall at home 01/06/2017   left hip pain  . Falls   . Family history of colon cancer 07/24/2012  . Fecal  impaction ([place]) 10/11/2012  . Femoral hernia, bilateral s/p lap repair 01/16/2013 01/16/2013  . Glaucoma, compensated 03/21/2014  . H/O cardiovascular stress test    a. 02/2003 -> no ischemia/infarct.  Marland Kitchen HAMMER TOE 04/23/2010   Qualifier: Diagnosis of  By: Linna Darner MD, Gwyndolyn Saxon    . Hematoma of leg   . Hip hematoma, right 09/09/2013  . History of shingles 2009  . HTN (hypertension) 08/22/2012  . Hx of echocardiogram    Echocardiogram 08/01/12: Mild LVH, EF 55-60%, normal  wall motion.  . Hydrocele 10/11/2011  . Hyperlipidemia   . 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 Hypothyroidism   . Macular degeneration disease   . Macular edema   . Migraines    Dr Jannifer Franklin  . MORTON'S NEUROMA, LEFT 08/22/2007   Qualifier: Diagnosis of  By: Linna Darner MD, Gwyndolyn Saxon    . PAD (peripheral artery disease) ([place])   . Peripheral neuropathy   . Personal history of colonic polyps 01/05/2013   2014 2 mm adenomatous polyp   . PREMATURE ATRIAL CONTRACTIONS 03/14/2006   Qualifier: Diagnosis of  By: Linna Darner MD, Gwendolyn Lima 12/16/2011  . SPINAL STENOSIS 03/14/2006   Qualifier: Diagnosis of  By: Linna Darner MD, William   Cervical spine    . Testosterone deficiency    Dr Lawerance Bach, [facility]  . Unspecified vitamin D deficiency 08/06/2012  . Urge incontinence 10/09/2012  . Urinary tract infection, site not specified 02/13/2013  . Weakness 08/02/2012   Past Surgical History:  Procedure Laterality Date  . APPENDECTOMY  1941  . BREAST CYST EXCISION Left 01/16/2013   Procedure: MASS EXCISION AXILLA;  Surgeon: Adin Hector, MD;  Location: [place];  Service: General;  Laterality: Left;  . CAROTID ENDARTERECTOMY  2005    L  . CATARACT EXTRACTION Right   . COLONOSCOPY  2014   Dr. Deatra Ina  . EYE SURGERY Right 02/2012   retina  . INGUINAL HERNIA REPAIR Bilateral 01/16/2013   Procedure: LAPAROSCOPIC BILATERAL INGUINAL DIRECT AND INDIRECT, FEMORAL, AND OBTURATOR HERNIAS;  Surgeon: Adin Hector, MD;  Location: [place];  Service: General;  Laterality: Bilateral;  . INSERTION OF MESH N/A 01/16/2013   Procedure: INSERTION OF MESH;  Surgeon: Adin Hector, MD;  Location: [place] [facility];  Service: General;  Laterality: N/A;  . LUMBAR LAMINECTOMY    . TONSILLECTOMY AND ADENOIDECTOMY    . TOTAL HIP ARTHROPLASTY  2005   L    Allergies  Allergen Reactions  . Latex Rash  . Tape Rash    Paper tape is ok to use     Outpatient Encounter Medications as of 07/15/2017  Medication Sig  . acetaminophen  (TYLENOL) 325 MG tablet Take 2 tablets (650 mg total) by mouth every 6 (six) hours as needed for mild pain (or Fever >/= 101).  Marland Kitchen acetaminophen (TYLENOL) 500 MG tablet Take 1,000 mg by mouth 2 (two) times daily.  Marland Kitchen aspirin 81 MG tablet Take 81 mg by mouth every morning.   Marland Kitchen atorvastatin (LIPITOR) 20 MG tablet TAKE 1 TABLET BY MOUTH DAILY FOR CHOLESTEROL  . B Complex-C (B-COMPLEX WITH VITAMIN C) tablet Take 1 tablet by mouth daily.  . bimatoprost (LUMIGAN) 0.01 % SOLN Place 1 drop into both eyes at bedtime.  Marland Kitchen buPROPion (WELLBUTRIN XL) 150 MG 24 hr tablet Take 150 mg by mouth daily.   . Cholecalciferol (VITAMIN D3) 1000 units CAPS Take 2,000 Units by mouth daily.   . fluticasone (CUTIVATE) 0.05 % cream Apply 1 application  topically 2 (two) times daily as needed (irritation).   Marland Kitchen HYDROcodone-acetaminophen (NORCO/VICODIN) 5-325 MG tablet Take 1 tablet by mouth every 8 (eight) hours as needed for moderate pain.  Marland Kitchen ketoconazole (NIZORAL) 2 % cream Apply 1 application topically daily as needed for irritation.   Javier Docker Oil 500 MG CAPS Take 500 mg by mouth every morning.   Marland Kitchen levothyroxine (SYNTHROID, LEVOTHROID) 50 MCG tablet Take 50 mcg by mouth daily before breakfast. Take one tablet daily except on Sunday's take 1/2 tablet.  . linaclotide (LINZESS) 145 MCG CAPS capsule Take 1 capsule (145 mcg total) by mouth daily before breakfast.  . Melatonin 3 MG TABS Take 1 tablet by mouth at bedtime.  . mirtazapine (REMERON) 30 MG tablet Take 1 tablet (30 mg total) by mouth at bedtime.  . Multiple Vitamins-Minerals (ICAPS PO) Take 1 tablet by mouth daily.  . Nutritional Supplements (BOOST PO) Take 1 Can by mouth daily.  . polyethylene glycol (MIRALAX / GLYCOLAX) packet Take 17 g by mouth daily as needed for mild constipation.  . sertraline (ZOLOFT) 100 MG tablet Take 200 mg by mouth every morning.   . tamsulosin (FLOMAX) 0.4 MG CAPS capsule TAKE 1 CAPSULE (0.4 MG TOTAL) BY MOUTH DAILY.  Marland Kitchen topiramate (TOPAMAX)  25 MG tablet TAKE 1 TABLET BY MOUTH EVERY MORNING AND 2 TABLETS BY MOUTH EVERY EVENING  . zinc oxide 20 % ointment Apply 1 application topically as directed. Apply three times daily and as needed   No facility-administered encounter medications on file as of 07/15/2017.     Review of Systems  Constitutional: Negative for appetite change, chills, fatigue and fever.  HENT: Positive for hearing loss, rhinorrhea and sore throat. Negative for congestion, sinus pressure, sinus pain and sneezing.   Eyes: Positive for visual disturbance. Negative for discharge, redness and itching.       Left eye blind wears eye glasses.   Respiratory: Negative for cough, chest tightness, shortness of breath and wheezing.   Cardiovascular: Negative for chest pain, palpitations and leg swelling.  Gastrointestinal: Negative for abdominal distention, abdominal pain, constipation, diarrhea, nausea and vomiting.  Musculoskeletal: Positive for gait problem.  Skin: Negative for color change and pallor.  Neurological: Negative for dizziness, light-headedness and headaches.  Psychiatric/Behavioral: Negative for agitation and sleep disturbance. The patient is not nervous/anxious.     Immunization History  Administered Date(s) Administered  . DTaP 11/05/2005  . Hepatitis B 1932-09-20  . Influenza Split 11/12/2011, 11/29/2013  . Influenza Whole 10/30/2007, 11/12/2008, 11/26/2009  . Influenza, High Dose Seasonal PF 12/31/2015, 11/24/2016  . PPD Test 06/23/2011  . Pneumococcal Conjugate-13 01/14/2016  . Pneumococcal Polysaccharide-23 02/16/1999  . Tdap 01/13/2017  . Varicella 11/09/2005  . Zoster 08/30/2006   Pertinent  Health Maintenance Due  Topic Date Due  . INFLUENZA VACCINE  09/15/2017  . PNA vac Low Risk Adult  Completed   Fall Risk  10/27/2016 06/09/2016 02/03/2016 10/28/2015 01/16/2015  Falls in the past year? Yes Yes Yes Yes Yes  Number falls in past yr: 2 or more 2 or more 2 or more 2 or more 2 or more    Comment - - 2 falls in last 2 months - -  Injury with Fall? Yes No Yes No (No Data)  Comment L hip - scraps and bruises - head  Risk Factor Category  - - - - -  Risk for fall due to : - - - Impaired balance/gait -  Follow up - - - - -  Vitals:   07/15/17 1324  BP: 117/67  Pulse: 61  Resp: 20  Temp: 98 F (36.7 C)  SpO2: 96%  Weight: 146 lb (66.2 kg)  Height: 6' (1.829 m)   Body mass index is 19.8 kg/m. Physical Exam  Constitutional: He is oriented to person, place, and time.  Thin built,elderly in no acute distress   HENT:  Head: Normocephalic.  Right Ear: External ear normal.  Left Ear: External ear normal.  Mouth/Throat: Oropharynx is clear and moist. No oropharyngeal exudate.  Eyes: Conjunctivae and EOM are normal. Right eye exhibits no discharge. Left eye exhibits no discharge. No scleral icterus.  Right eye pupil reactive to light.   Neck: Normal range of motion. No JVD present. No thyromegaly present.  Cardiovascular: Normal rate, regular rhythm, normal heart sounds and intact distal pulses. Exam reveals no gallop and no friction rub.  No murmur heard. Pulmonary/Chest: Effort normal and breath sounds normal. No stridor. No respiratory distress. He has no wheezes. He has no rales.  Abdominal: Soft. Bowel sounds are normal. He exhibits no distension and no mass. There is no tenderness. There is no rebound and no guarding.  Lymphadenopathy:    He has no cervical adenopathy.  Neurological: He is oriented to person, place, and time.  Skin: Skin is warm and dry. No rash noted. No erythema. No pallor.  Psychiatric: He has a normal mood and affect. His behavior is normal. Judgment and thought content normal. He exhibits abnormal recent memory.  Nursing note and vitals reviewed.   Labs reviewed: Recent Labs    01/08/17 1356 01/10/17 1528 01/11/17 0532 01/24/17 04/14/17  NA 140 140 141 140 142  142  K 4.1 4.8 4.1 4.4 4.4  4.4  CL 111 112* 111  --   --   CO2 23  21* 25  --   --   GLUCOSE 95 87 92  --   --   BUN 35* 30* 28* 28* 34*  CREATININE 1.26* 1.13 1.07 1.2 1.3  1.26  CALCIUM 8.7* 8.8* 8.3*  --  8.8   Recent Labs    01/10/17 1528 01/11/17 0532 01/24/17 04/14/17  AST 18 16 12* 15  15  ALT 17 15* 10 15  15   ALKPHOS 38 38 114 40  40  BILITOT 0.7 0.7  --  0.4  PROT 6.2* 5.3*  --  5.4  ALBUMIN 3.2* 2.8*  --  3.3   Recent Labs    01/08/17 1356 01/10/17 1528 01/11/17 0532 01/24/17 04/14/17  WBC 6.6 6.9 6.3 6.3 5.7  5.7  NEUTROABS 4.6 5.3  --   --   --   HGB 12.4* 12.4* 11.9* 13.4* 14.0  14.0  HCT 38.6* 39.3 38.1* 42 42  42.1  MCV 96.5 97.3 96.2  --   --   PLT 141* 152 143* 239 134*   Lab Results  Component Value Date   TSH 1.87 06/16/2017   Lab Results  Component Value Date   HGBA1C 5.3 07/31/2012   Lab Results  Component Value Date   CHOL 125 05/16/2017   CHOL 125 05/16/2017   HDL 34 (A) 05/16/2017   LDLCALC 74 05/16/2017   LDLCALC 74 05/16/2017   LDLDIRECT 132.5 09/08/2012   TRIG 88 05/16/2017   TRIG 88 05/16/2017   CHOLHDL 2.7 11/15/2016    Significant Diagnostic Results in last 30 days:  No results found.  Assessment/Plan  Acute sore throat Afebrile.Negative exam findings.continue on Tylenol as needed for pain.start chloraseptic  spray 2 prays to throat every 6 hours while awake x 48 hours.encourage not to drink or eat x 30 minutes after using spray.continues to monitor.Notify provider if symptoms worsen or not resolved.continue to monitor.   Family/ staff Communication: Reviewed plan of care with patient and facility Nurse.   Labs/tests ordered: None   Dinah C [name], NP

## 2017-07-27 ENCOUNTER — Non-Acute Institutional Stay (SKILLED_NURSING_FACILITY): Payer: Medicare Other | Admitting: Family

## 2017-07-27 ENCOUNTER — Encounter: Payer: Self-pay | Admitting: Family

## 2017-07-27 DIAGNOSIS — R030 Elevated blood-pressure reading, without diagnosis of hypertension: Secondary | ICD-10-CM

## 2017-07-27 NOTE — Progress Notes (Signed)
Location:  [facility] Room Number: 34 Place of Service:  SNF [phone]) Provider: Nery Kalisz FNP-C  [name], MD  Patient Care Team: [name], MD as PCP - General (Internal Medicine) [name], MD as Consulting Physician (Urology) [name], MD as Consulting Physician (Neurology) [name], MD as Consulting Physician (Dermatology) [name], MD (Inactive) as Consulting Physician (Gastroenterology) [name], MD as Consulting Physician (Orthopedic Surgery) [name], MD as Consulting Physician (Cardiology) [name], MD as Consulting Physician (Ophthalmology) [name], MD as Referring Physician (Psychiatry) [name], MD as Consulting Physician (General Surgery) [name], MD as Consulting Physician (Orthopedic Surgery) Ellinor Test, [name], NP as Nurse Practitioner (Family Medicine)  Extended Emergency Contact Information Primary Emergency Contact: Pernice,[name] Address: [address]          [place], [place] [address] [place] Phone: [phone] Relation: Spouse  Code Status:  DNR Goals of care: Advanced Directive information Advanced Directives 07/27/2017  Does Patient Have a Medical Advance Directive? Yes  Type of Paramedic of [place];Living will;Out of facility DNR (pink MOST or yellow form)  Does patient want to make changes to medical advance directive? No - Patient declined  Copy of [place] in Chart? Yes  Pre-existing out of facility DNR order (yellow form or pink MOST form) Yellow form placed in chart (order not valid for inpatient use);Pink MOST form placed in chart (order not valid for inpatient use)     Chief Complaint  Patient presents with  . Acute Visit    Elevated blood pressure     HPI:  Pt is a 84 y.o. male seen today at [facility] for an acute visit for evaluation of  elevated blood pressure.He is seen in his room today per facility Nurse reports.Nurse reports patient's blood pressure reading was 210/60; 184/98 overnight.On call provider ordered clonidine 0.1 mg tablet.blood pressure rechecked 137/68.He states was upset but doing well today.He denies headache,dizziness,visual disturbance,nausea or vomiting.He states his anxiety and depression stable.he further denies any fever or chills.   Past Medical History:  Diagnosis Date  . Acute urinary retention 01/28/2013  . AKI (acute kidney injury) ([place]) 01/26/2013  . Altered mental status 09/09/2013  . Anemia    B12 deficiency  . ANXIETY 06/07/2007   Qualifier: Diagnosis of  By: Talbert Cage CMA ([place]), June    . ARTHRITIS 06/07/2007   Qualifier: Diagnosis of  By: Talbert Cage CMA ([place]), June    . Balance problems 04/05/2011  . Benign essential tremor   . Benign fibroma of prostate 10/11/2011  . Bilateral inguinal hernia 10/09/2012  . Bilateral leg edema 09/13/2012  . Bladder retention 02/05/2013  . Bradycardia, sinus 08/02/2012  . CAROTID ARTERY DISEASE 03/31/2007   Qualifier: Diagnosis of  By: Linna Darner MD, Gwyndolyn Saxon    . Carotid stenosis    s/p L CEA  . Cataract, nuclear 03/21/2014  . 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 Cervical spinal stenosis   . Cervical stenosis of spine   . Critical lower limb ischemia   . Degeneration macular 11/02/2011  . Depression    Dr Albertine Patricia  . Difficulty in walking(719.7) 04/05/2011  . ESOPHAGITIS 08/06/2002   Qualifier: Diagnosis of  By: Talbert Cage CMA ([place]), June    . Essential and other specified forms of tremor 04/18/2013  . Fall at home 01/06/2017   left hip pain  .  Falls   . Family history of colon cancer 07/24/2012  . Fecal impaction ([place]) 10/11/2012  . Femoral hernia, bilateral s/p lap repair 01/16/2013 01/16/2013  . Glaucoma, compensated 03/21/2014  . H/O cardiovascular stress test    a. 02/2003 -> no ischemia/infarct.  Marland Kitchen HAMMER TOE 04/23/2010   Qualifier: Diagnosis of  By: Linna Darner MD,  Gwyndolyn Saxon    . Hematoma of leg   . Hip hematoma, right 09/09/2013  . History of shingles 2009  . HTN (hypertension) 08/22/2012  . Hx of echocardiogram    Echocardiogram 08/01/12: Mild LVH, EF 55-60%, normal wall motion.  . Hydrocele 10/11/2011  . Hyperlipidemia   . 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 Hypothyroidism   . Macular degeneration disease   . Macular edema   . Migraines    Dr Jannifer Franklin  . MORTON'S NEUROMA, LEFT 08/22/2007   Qualifier: Diagnosis of  By: Linna Darner MD, Gwyndolyn Saxon    . PAD (peripheral artery disease) ([place])   . Peripheral neuropathy   . Personal history of colonic polyps 01/05/2013   2014 2 mm adenomatous polyp   . PREMATURE ATRIAL CONTRACTIONS 03/14/2006   Qualifier: Diagnosis of  By: Linna Darner MD, Gwendolyn Lima 12/16/2011  . SPINAL STENOSIS 03/14/2006   Qualifier: Diagnosis of  By: Linna Darner MD, William   Cervical spine    . Testosterone deficiency    Dr Lawerance Bach, [facility]  . Unspecified vitamin D deficiency 08/06/2012  . Urge incontinence 10/09/2012  . Urinary tract infection, site not specified 02/13/2013  . Weakness 08/02/2012   Past Surgical History:  Procedure Laterality Date  . APPENDECTOMY  1941  . BREAST CYST EXCISION Left 01/16/2013   Procedure: MASS EXCISION AXILLA;  Surgeon: Adin Hector, MD;  Location: [facility];  Service: General;  Laterality: Left;  . CAROTID ENDARTERECTOMY  2005    L  . CATARACT EXTRACTION Right   . COLONOSCOPY  2014   Dr. Deatra Ina  . EYE SURGERY Right 02/2012   retina  . INGUINAL HERNIA REPAIR Bilateral 01/16/2013   Procedure: LAPAROSCOPIC BILATERAL INGUINAL DIRECT AND INDIRECT, FEMORAL, AND OBTURATOR HERNIAS;  Surgeon: Adin Hector, MD;  Location: [facility];  Service: General;  Laterality: Bilateral;  . INSERTION OF MESH N/A 01/16/2013   Procedure: INSERTION OF MESH;  Surgeon: Adin Hector, MD;  Location: [facility];  Service: General;  Laterality: N/A;  . LUMBAR LAMINECTOMY    . TONSILLECTOMY AND ADENOIDECTOMY    . TOTAL HIP  ARTHROPLASTY  2005   L    Allergies  Allergen Reactions  . Latex Rash  . Tape Rash    Paper tape is ok to use     Outpatient Encounter Medications as of 07/27/2017  Medication Sig  . acetaminophen (TYLENOL) 325 MG tablet Take 2 tablets (650 mg total) by mouth every 6 (six) hours as needed for mild pain (or Fever >/= 101).  Marland Kitchen acetaminophen (TYLENOL) 500 MG tablet Take 1,000 mg by mouth 2 (two) times daily.  Marland Kitchen aspirin 81 MG tablet Take 81 mg by mouth every morning.   Marland Kitchen atorvastatin (LIPITOR) 20 MG tablet TAKE 1 TABLET BY MOUTH DAILY FOR CHOLESTEROL  . B Complex-C (B-COMPLEX WITH VITAMIN C) tablet Take 1 tablet by mouth daily.  . bimatoprost (LUMIGAN) 0.01 % SOLN Place 1 drop into both eyes at bedtime.  Marland Kitchen buPROPion (WELLBUTRIN XL) 150 MG 24 hr tablet Take 150 mg by mouth daily.   . Cholecalciferol (VITAMIN D3) 1000 units CAPS Take 2,000 Units by  mouth daily.   . fluticasone (CUTIVATE) 0.05 % cream Apply 1 application topically 2 (two) times daily as needed (irritation).   Marland Kitchen ketoconazole (NIZORAL) 2 % cream Apply 1 application topically daily as needed for irritation.   Javier Docker Oil 500 MG CAPS Take 500 mg by mouth every morning.   Marland Kitchen levothyroxine (SYNTHROID, LEVOTHROID) 50 MCG tablet Take 50 mcg by mouth daily before breakfast. Take one tablet daily except on Sunday's take 1/2 tablet.  . linaclotide (LINZESS) 145 MCG CAPS capsule Take 1 capsule (145 mcg total) by mouth daily before breakfast.  . Melatonin 3 MG TABS Take 1 tablet by mouth at bedtime.  . mirtazapine (REMERON) 30 MG tablet Take 1 tablet (30 mg total) by mouth at bedtime.  . Multiple Vitamins-Minerals (ICAPS PO) Take 1 tablet by mouth daily.  . polyethylene glycol (MIRALAX / GLYCOLAX) packet Take 17 g by mouth daily as needed for mild constipation.  . sertraline (ZOLOFT) 100 MG tablet Take 200 mg by mouth every morning.   . tamsulosin (FLOMAX) 0.4 MG CAPS capsule TAKE 1 CAPSULE (0.4 MG TOTAL) BY MOUTH DAILY.  Marland Kitchen topiramate  (TOPAMAX) 25 MG tablet TAKE 1 TABLET BY MOUTH EVERY MORNING AND 2 TABLETS BY MOUTH EVERY EVENING  . zinc oxide 20 % ointment Apply 1 application topically as directed. Apply three times daily and as needed  . [DISCONTINUED] HYDROcodone-acetaminophen (NORCO/VICODIN) 5-325 MG tablet Take 1 tablet by mouth every 8 (eight) hours as needed for moderate pain.  . [DISCONTINUED] Nutritional Supplements (BOOST PO) Take 1 Can by mouth daily.   No facility-administered encounter medications on file as of 07/27/2017.     Review of Systems  Constitutional: Negative for appetite change, chills, fatigue and fever.  HENT: Negative for congestion, rhinorrhea, sinus pressure, sinus pain, sneezing and sore throat.   Eyes: Negative for discharge, redness and itching.       Wears eye glasses   Respiratory: Negative for cough, chest tightness, shortness of breath and wheezing.   Cardiovascular: Negative for chest pain, palpitations and leg swelling.  Gastrointestinal: Negative for abdominal distention, abdominal pain, constipation, diarrhea, nausea and vomiting.  Genitourinary: Negative for dysuria, flank pain, frequency and urgency.  Musculoskeletal: Positive for gait problem.  Skin: Negative for color change, pallor and rash.  Neurological: Negative for dizziness, light-headedness and headaches.  Psychiatric/Behavioral: Negative for agitation and sleep disturbance. The patient is not nervous/anxious.     Immunization History  Administered Date(s) Administered  . DTaP 11/05/2005  . Hepatitis B 10/28/1932  . Influenza Split 11/12/2011, 11/29/2013  . Influenza Whole 10/30/2007, 11/12/2008, 11/26/2009  . Influenza, High Dose Seasonal PF 12/31/2015, 11/24/2016  . PPD Test 06/23/2011  . Pneumococcal Conjugate-13 01/14/2016  . Pneumococcal Polysaccharide-23 02/16/1999  . Tdap 01/13/2017  . Varicella 11/09/2005  . Zoster 08/30/2006   Pertinent  Health Maintenance Due  Topic Date Due  . INFLUENZA VACCINE   09/15/2017  . PNA vac Low Risk Adult  Completed   Fall Risk  10/27/2016 06/09/2016 02/03/2016 10/28/2015 01/16/2015  Falls in the past year? Yes Yes Yes Yes Yes  Number falls in past yr: 2 or more 2 or more 2 or more 2 or more 2 or more  Comment - - 2 falls in last 2 months - -  Injury with Fall? Yes No Yes No (No Data)  Comment L hip - scraps and bruises - head  Risk Factor Category  - - - - -  Risk for fall due to : - - -  Impaired balance/gait -  Follow up - - - - -    Vitals:   07/27/17 1144  BP: 137/68  Pulse: 66  Resp: 20  Temp: 97.6 F (36.4 C)  TempSrc: Oral  SpO2: 96%  Weight: 150 lb 3.2 oz (68.1 kg)  Height: 6' (1.829 m)   Body mass index is 20.37 kg/m. Physical Exam  Constitutional:  Thin tall elderly in no acute distress   HENT:  Head: Normocephalic.  Mouth/Throat: Oropharynx is clear and moist. No oropharyngeal exudate.  Eyes: Pupils are equal, round, and reactive to light. Conjunctivae are normal. Right eye exhibits no discharge. No scleral icterus.  Eye glasses in place   Neck: Normal range of motion. No JVD present. No thyromegaly present.  Cardiovascular: Normal rate, regular rhythm, normal heart sounds and intact distal pulses. Exam reveals no gallop and no friction rub.  No murmur heard. Pulmonary/Chest: Effort normal and breath sounds normal. No respiratory distress. He has no wheezes. He has no rales.  Abdominal: Soft. Bowel sounds are normal. He exhibits no distension and no mass. There is no tenderness. There is no rebound and no guarding.  Musculoskeletal: He exhibits no edema or tenderness.  Moves x 4 extremities unsteady gait ambulates with front wheel walker.   Lymphadenopathy:    He has no cervical adenopathy.  Neurological: He displays tremor. Gait abnormal.  Alert and oriented to person and place.  Skin: Skin is warm and dry. No rash noted. No erythema. No pallor.  Psychiatric: He has a normal mood and affect. His speech is normal and behavior  is normal. Judgment and thought content normal.    Labs reviewed: Recent Labs    01/08/17 1356 01/10/17 1528 01/11/17 0532 01/24/17 04/14/17  NA 140 140 141 140 142  142  K 4.1 4.8 4.1 4.4 4.4  4.4  CL 111 112* 111  --   --   CO2 23 21* 25  --   --   GLUCOSE 95 87 92  --   --   BUN 35* 30* 28* 28* 34*  CREATININE 1.26* 1.13 1.07 1.2 1.3  1.26  CALCIUM 8.7* 8.8* 8.3*  --  8.8   Recent Labs    01/10/17 1528 01/11/17 0532 01/24/17 04/14/17  AST 18 16 12* 15  15  ALT 17 15* 10 15  15   ALKPHOS 38 38 114 40  40  BILITOT 0.7 0.7  --  0.4  PROT 6.2* 5.3*  --  5.4  ALBUMIN 3.2* 2.8*  --  3.3   Recent Labs    01/08/17 1356 01/10/17 1528 01/11/17 0532 01/24/17 04/14/17  WBC 6.6 6.9 6.3 6.3 5.7  5.7  NEUTROABS 4.6 5.3  --   --   --   HGB 12.4* 12.4* 11.9* 13.4* 14.0  14.0  HCT 38.6* 39.3 38.1* 42 42  42.1  MCV 96.5 97.3 96.2  --   --   PLT 141* 152 143* 239 134*   Lab Results  Component Value Date   TSH 1.87 06/16/2017   Lab Results  Component Value Date   HGBA1C 5.3 07/31/2012   Lab Results  Component Value Date   CHOL 125 05/16/2017   CHOL 125 05/16/2017   HDL 34 (A) 05/16/2017   LDLCALC 74 05/16/2017   LDLCALC 74 05/16/2017   LDLDIRECT 132.5 09/08/2012   TRIG 88 05/16/2017   TRIG 88 05/16/2017   CHOLHDL 2.7 11/15/2016    Significant Diagnostic Results in last 30 days:  No results  found.  Assessment/Plan  Elevated blood pressure reading B/p readings elevated overnight but went down to 137/68 after taking clonidine x 1 dose.Overall B/P readings reviewed ranging in the 100's/70's-150's/70's.Patient states might be because he was upset last night but feeling well today.negative exam findings.Encouraged positive ways to manage stress and emotions.Also to notify provider if needs to follow up with his Psychiatry/Therapy.Monitor B/p and HR every shift x 1 week then resume previous orders.notify provider if SBP > 160   Family/ staff Communication:  Reviewed plan of care with patient and facility Nurse.   Labs/tests ordered: None   Kainoah Bartosiewicz C Christin Moline, NP

## 2017-08-01 ENCOUNTER — Non-Acute Institutional Stay (SKILLED_NURSING_FACILITY): Payer: Medicare Other | Admitting: Family

## 2017-08-01 ENCOUNTER — Encounter: Payer: Self-pay | Admitting: Family

## 2017-08-01 DIAGNOSIS — I10 Essential (primary) hypertension: Secondary | ICD-10-CM

## 2017-08-01 DIAGNOSIS — F329 Major depressive disorder, single episode, unspecified: Secondary | ICD-10-CM

## 2017-08-01 DIAGNOSIS — E039 Hypothyroidism, unspecified: Secondary | ICD-10-CM | POA: Diagnosis not present

## 2017-08-01 NOTE — Progress Notes (Signed)
Location:  [facility] Room Number: 34 Place of Service:  SNF [phone]) Provider: Gibson Lad FNP-C   [name], MD  Patient Care Team: [name], MD as PCP - General (Internal Medicine) [name], MD as Consulting Physician (Urology) [name], MD as Consulting Physician (Neurology) [name], MD as Consulting Physician (Dermatology) [name], MD (Inactive) as Consulting Physician (Gastroenterology) [name], MD as Consulting Physician (Orthopedic Surgery) [name], MD as Consulting Physician (Cardiology) [name], MD as Consulting Physician (Ophthalmology) [name], MD as Referring Physician (Psychiatry) [name], MD as Consulting Physician (General Surgery) [name], MD as Consulting Physician (Orthopedic Surgery) Delvecchio Madole, [name], NP as Nurse Practitioner (Family Medicine)  Extended Emergency Contact Information Primary Emergency Contact: Robinette,[name] Address: [address]          [place], [place] [address] [place] Phone: [phone] Relation: Spouse  Code Status:  DNR  Goals of care: Advanced Directive information Advanced Directives 08/01/2017  Does Patient Have a Medical Advance Directive? Yes  Type of Paramedic of [place];Out of facility DNR (pink MOST or yellow form);Living will  Does patient want to make changes to medical advance directive? -  Copy of [place] in Chart? Yes  Pre-existing out of facility DNR order (yellow form or pink MOST form) Yellow form placed in chart (order not valid for inpatient use);Pink MOST form placed in chart (order not valid for inpatient use)     Chief Complaint  Patient presents with  . Medical Management of Chronic Issues    also has been having elevated BP    HPI:  Pt is a 84 y.o. male seen today [facility] for medical management of  chronic diseases. He has a medical history of Hypothyroidism,CAD,Anxiety,PAD,Essential tremor,BPH,CKD stage 3  Among other conditions.He is seen in his room today.Facility Nurse reports patient's blood pressure was elevated over the weekend.184/98 (07/26/2017);204/96 (07/28/2017) and  205/98 (07/31/2017) on call provider was notified clonidine 0.1 mg tablet given with much improvement and Norvasc 5 mg tablet was initiated.He denies any Headache,dizziness,nausea vomiting or chest pain. He has had no recent fall episode or weight changes.he states his appetite is fair.    Past Medical History:  Diagnosis Date  . Acute urinary retention 01/28/2013  . AKI (acute kidney injury) ([place]) 01/26/2013  . Altered mental status 09/09/2013  . Anemia    B12 deficiency  . ANXIETY 06/07/2007   Qualifier: Diagnosis of  By: Talbert Cage CMA ([place]), June    . ARTHRITIS 06/07/2007   Qualifier: Diagnosis of  By: Talbert Cage CMA ([place]), June    . Balance problems 04/05/2011  . Benign essential tremor   . Benign fibroma of prostate 10/11/2011  . Bilateral inguinal hernia 10/09/2012  . Bilateral leg edema 09/13/2012  . Bladder retention 02/05/2013  . Bradycardia, sinus 08/02/2012  . CAROTID ARTERY DISEASE 03/31/2007   Qualifier: Diagnosis of  By: Linna Darner MD, Gwyndolyn Saxon    . Carotid stenosis    s/p L CEA  . Cataract, nuclear 03/21/2014  . 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 Cervical spinal stenosis   . Cervical stenosis of spine   . Critical lower limb ischemia   . Degeneration macular 11/02/2011  . Depression    Dr Albertine Patricia  . Difficulty in walking(719.7) 04/05/2011  . ESOPHAGITIS 08/06/2002   Qualifier: Diagnosis of  By: Talbert Cage CMA (AAMA),  June    . Essential and other specified forms of tremor 04/18/2013  . Fall at home 01/06/2017   left hip pain  . Falls   . Family history of colon cancer 07/24/2012  . Fecal impaction ([place]) 10/11/2012  . Femoral hernia, bilateral s/p lap repair 01/16/2013 01/16/2013  . Glaucoma, compensated  03/21/2014  . H/O cardiovascular stress test    a. 02/2003 -> no ischemia/infarct.  Marland Kitchen HAMMER TOE 04/23/2010   Qualifier: Diagnosis of  By: Linna Darner MD, Gwyndolyn Saxon    . Hematoma of leg   . Hip hematoma, right 09/09/2013  . History of shingles 2009  . HTN (hypertension) 08/22/2012  . Hx of echocardiogram    Echocardiogram 08/01/12: Mild LVH, EF 55-60%, normal wall motion.  . Hydrocele 10/11/2011  . Hyperlipidemia   . 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 Hypothyroidism   . Macular degeneration disease   . Macular edema   . Migraines    Dr Jannifer Franklin  . MORTON'S NEUROMA, LEFT 08/22/2007   Qualifier: Diagnosis of  By: Linna Darner MD, Gwyndolyn Saxon    . PAD (peripheral artery disease) ([place])   . Peripheral neuropathy   . Personal history of colonic polyps 01/05/2013   2014 2 mm adenomatous polyp   . PREMATURE ATRIAL CONTRACTIONS 03/14/2006   Qualifier: Diagnosis of  By: Linna Darner MD, Gwendolyn L[place] 12/16/2011  . SPINAL STENOSIS 03/14/2006   Qualifier: Diagnosis of  By: Linna Darner MD, William   Cervical spine    . Testosterone deficiency    Dr Lawerance Bach, [facility]  . Unspecified vitamin D deficiency 08/06/2012  . Urge incontinence 10/09/2012  . Urinary tract infection, site not specified 02/13/2013  . Weakness 08/02/2012   Past Surgical History:  Procedure Laterality Date  . APPENDECTOMY  1941  . BREAST CYST EXCISION Left 01/16/2013   Procedure: MASS EXCISION AXILLA;  Surgeon: Adin Hector, MD;  Location: [facility];  Service: General;  Laterality: Left;  . CAROTID ENDARTERECTOMY  2005    L  . CATARACT EXTRACTION Right   . COLONOSCOPY  2014   Dr. Deatra Ina  . EYE SURGERY Right 02/2012   retina  . INGUINAL HERNIA REPAIR Bilateral 01/16/2013   Procedure: LAPAROSCOPIC BILATERAL INGUINAL DIRECT AND INDIRECT, FEMORAL, AND OBTURATOR HERNIAS;  Surgeon: Adin Hector, MD;  Location: [facility];  Service: General;  Laterality: Bilateral;  . INSERTION OF MESH N/A 01/16/2013   Procedure: INSERTION OF MESH;  Surgeon: Adin Hector, MD;  Location: [facility];  Service: General;  Laterality: N/A;  . LUMBAR LAMINECTOMY    . TONSILLECTOMY AND ADENOIDECTOMY    . TOTAL HIP ARTHROPLASTY  2005   L    Allergies  Allergen Reactions  . Latex Rash  . Tape Rash    Paper tape is ok to use     Allergies as of 08/01/2017      Reactions   Latex Rash   Tape Rash   Paper tape is ok to use      Medication List        Accurate as of 08/01/17  4:21 PM. Always use your most recent med list.          acetaminophen 500 MG tablet Commonly known as:  TYLENOL Take 1,000 mg by mouth 2 (two) times daily.   acetaminophen 325 MG tablet Commonly known as:  TYLENOL Take 2 tablets (650 mg total) by mouth every 6 (six) hours as needed for mild pain (or Fever >/= 101).  aspirin 81 MG tablet Take 81 mg by mouth every morning.   atorvastatin 20 MG tablet Commonly known as:  LIPITOR TAKE 1 TABLET BY MOUTH DAILY FOR CHOLESTEROL   B-complex with vitamin C tablet Take 1 tablet by mouth daily.   bimatoprost 0.01 % Soln Commonly known as:  LUMIGAN Place 1 drop into both eyes at bedtime.   buPROPion 150 MG 24 hr tablet Commonly known as:  WELLBUTRIN XL Take 150 mg by mouth daily.   fluticasone 0.05 % cream Commonly known as:  CUTIVATE Apply 1 application topically 2 (two) times daily as needed (irritation).   ICAPS PO Take 1 tablet by mouth daily.   ketoconazole 2 % cream Commonly known as:  NIZORAL Apply 1 application topically daily as needed for irritation.   Krill Oil 500 MG Caps Take 500 mg by mouth every morning.   levothyroxine 50 MCG tablet Commonly known as:  SYNTHROID, LEVOTHROID Take 50 mcg by mouth daily before breakfast. Take one tablet daily except on Sunday's take 1/2 tablet.   linaclotide 145 MCG Caps capsule Commonly known as:  LINZESS Take 1 capsule (145 mcg total) by mouth daily before breakfast.   Melatonin 3 MG Tabs Take 1 tablet by mouth at bedtime.   mirtazapine 30 MG tablet Commonly  known as:  REMERON Take 1 tablet (30 mg total) by mouth at bedtime.   polyethylene glycol packet Commonly known as:  MIRALAX / GLYCOLAX Take 17 g by mouth daily as needed for mild constipation.   sertraline 100 MG tablet Commonly known as:  ZOLOFT Take 200 mg by mouth every morning.   tamsulosin 0.4 MG Caps capsule Commonly known as:  FLOMAX TAKE 1 CAPSULE (0.4 MG TOTAL) BY MOUTH DAILY.   topiramate 25 MG tablet Commonly known as:  TOPAMAX TAKE 1 TABLET BY MOUTH EVERY MORNING AND 2 TABLETS BY MOUTH EVERY EVENING   Vitamin D3 1000 units Caps Take 2,000 Units by mouth daily.   zinc oxide 20 % ointment Apply 1 application topically as directed. Apply three times daily and as needed       Review of Systems  Constitutional: Negative for appetite change, chills, fatigue and fever.  HENT: Negative for congestion, rhinorrhea, sinus pressure, sinus pain, sneezing and sore throat.   Eyes: Positive for visual disturbance. Negative for discharge, redness and itching.       Left eye macular degeneration   Respiratory: Negative for cough, chest tightness, shortness of breath and wheezing.   Cardiovascular: Negative for chest pain, palpitations and leg swelling.  Gastrointestinal: Negative for abdominal distention, abdominal pain, constipation, diarrhea, nausea and vomiting.  Endocrine: Negative for cold intolerance, heat intolerance, polydipsia, polyphagia and polyuria.  Genitourinary: Negative for dysuria, flank pain, frequency and urgency.  Musculoskeletal: Positive for gait problem.  Skin: Negative for color change, pallor and rash.  Neurological: Negative for dizziness, light-headedness and headaches.  Psychiatric/Behavioral: Negative for agitation, confusion and sleep disturbance. The patient is not nervous/anxious.     Immunization History  Administered Date(s) Administered  . DTaP 11/05/2005  . Hepatitis B Nov 04, 1932  . Influenza Split 11/12/2011, 11/29/2013  . Influenza  Whole 10/30/2007, 11/12/2008, 11/26/2009  . Influenza, High Dose Seasonal PF 12/31/2015, 11/24/2016  . PPD Test 06/23/2011  . Pneumococcal Conjugate-13 01/14/2016  . Pneumococcal Polysaccharide-23 02/16/1999  . Tdap 01/13/2017  . Varicella 11/09/2005  . Zoster 08/30/2006   Pertinent  Health Maintenance Due  Topic Date Due  . INFLUENZA VACCINE  09/15/2017  . PNA vac Low Risk  Adult  Completed   Fall Risk  10/27/2016 06/09/2016 02/03/2016 10/28/2015 01/16/2015  Falls in the past year? Yes Yes Yes Yes Yes  Number falls in past yr: 2 or more 2 or more 2 or more 2 or more 2 or more  Comment - - 2 falls in last 2 months - -  Injury with Fall? Yes No Yes No (No Data)  Comment L hip - scraps and bruises - head  Risk Factor Category  - - - - -  Risk for fall due to : - - - Impaired balance/gait -  Follow up - - - - -    Vitals:   08/01/17 1520  BP: (!) 144/66  Pulse: (!) 54  Resp: 20  Temp: 98.5 F (36.9 C)  SpO2: 97%  Weight: 150 lb 3.2 oz (68.1 kg)  Height: 6' (1.829 m)   Body mass index is 20.37 kg/m. Physical Exam  Constitutional: He is oriented to person, place, and time.  Tall thin,frail elderly in no acute distress    HENT:  Head: Normocephalic.  Right Ear: External ear normal.  Left Ear: External ear normal.  Mouth/Throat: Oropharynx is clear and moist. No oropharyngeal exudate.  Eyes: Pupils are equal, round, and reactive to light. Conjunctivae are normal. Right eye exhibits no discharge. Left eye exhibits no discharge. No scleral icterus.  Eye glasses in place   Neck: Normal range of motion. No JVD present. No thyromegaly present.  Cardiovascular: Normal rate, regular rhythm, normal heart sounds and intact distal pulses. Exam reveals no gallop and no friction rub.  No murmur heard. Pulmonary/Chest: Effort normal and breath sounds normal. No stridor. No respiratory distress. He has no wheezes. He has no rales.  Abdominal: Soft. Bowel sounds are normal. He exhibits no  distension and no mass. There is no tenderness. There is no rebound and no guarding.  Musculoskeletal: He exhibits no edema or tenderness.  Moves x 4 extremities.unsteady gait ambulates with front wheel walker  Lymphadenopathy:    He has no cervical adenopathy.  Neurological: He is oriented to person, place, and time. He has normal strength. Gait abnormal.  Skin: Skin is warm and dry. No rash noted. No erythema. No pallor.  Psychiatric: He has a normal mood and affect. His behavior is normal. Judgment and thought content normal.   Labs reviewed: Recent Labs    01/08/17 1356 01/10/17 1528 01/11/17 0532 01/24/17 04/14/17  NA 140 140 141 140 142  142  K 4.1 4.8 4.1 4.4 4.4  4.4  CL 111 112* 111  --   --   CO2 23 21* 25  --   --   GLUCOSE 95 87 92  --   --   BUN 35* 30* 28* 28* 34*  CREATININE 1.26* 1.13 1.07 1.2 1.3  1.26  CALCIUM 8.7* 8.8* 8.3*  --  8.8   Recent Labs    01/10/17 1528 01/11/17 0532 01/24/17 04/14/17  AST 18 16 12* 15  15  ALT 17 15* 10 15  15   ALKPHOS 38 38 114 40  40  BILITOT 0.7 0.7  --  0.4  PROT 6.2* 5.3*  --  5.4  ALBUMIN 3.2* 2.8*  --  3.3   Recent Labs    01/08/17 1356 01/10/17 1528 01/11/17 0532 01/24/17 04/14/17  WBC 6.6 6.9 6.3 6.3 5.7  5.7  NEUTROABS 4.6 5.3  --   --   --   HGB 12.4* 12.4* 11.9* 13.4* 14.0  14.0  HCT  38.6* 39.3 38.1* 42 42  42.1  MCV 96.5 97.3 96.2  --   --   PLT 141* 152 143* 239 134*   Lab Results  Component Value Date   TSH 1.87 06/16/2017   Lab Results  Component Value Date   HGBA1C 5.3 07/31/2012   Lab Results  Component Value Date   CHOL 125 05/16/2017   CHOL 125 05/16/2017   HDL 34 (A) 05/16/2017   LDLCALC 74 05/16/2017   LDLCALC 74 05/16/2017   LDLDIRECT 132.5 09/08/2012   TRIG 88 05/16/2017   TRIG 88 05/16/2017   CHOLHDL 2.7 11/15/2016    Significant Diagnostic Results in last 30 days:  No results found.  Assessment/Plan  1. Hypothyroidism Lab Results  Component Value Date   TSH  1.87 06/16/2017  Continue on levothyroxine 50 mcg tablet daily before breakfast.continue to monitor TSH level.   2. Essential hypertension B/p elevated during the weekend now on Amlodipine 5 mg tablet daily.continue to monitor blood pressure.will increase amlodipine to 10 mg tablet daily if SBP still > 160.  3. Major depression, chronic Stable.continue on sertraline 200 mg tablet daily,Remeron 30 mg tablet at bedtime And Wellbutrin 150 mg tablet daily.continue to follow up with outpatient Psychiatry service. Continue to monitor for mood changes.   Family/ staff Communication: Reviewed plan of care with patient and facility Nurse.   Labs/tests ordered: None   Paidyn Mcferran C Kamori Barbier, NP

## 2017-08-08 ENCOUNTER — Encounter: Payer: Self-pay | Admitting: Internal Medicine

## 2017-08-08 ENCOUNTER — Non-Acute Institutional Stay (SKILLED_NURSING_FACILITY): Payer: Medicare Other | Admitting: Internal Medicine

## 2017-08-08 DIAGNOSIS — H1032 Unspecified acute conjunctivitis, left eye: Secondary | ICD-10-CM | POA: Diagnosis not present

## 2017-08-08 NOTE — Progress Notes (Signed)
Location:  [facility] Room Number: 34 Place of Service:  SNF [phone]) Provider:  [name], MD  [name], MD  Patient Care Team: [name], MD as PCP - General (Internal Medicine) [name], MD as Consulting Physician (Urology) [name], MD as Consulting Physician (Neurology) [name], MD as Consulting Physician (Dermatology) [name], MD (Inactive) as Consulting Physician (Gastroenterology) [name], MD as Consulting Physician (Orthopedic Surgery) [name], MD as Consulting Physician (Cardiology) [name], MD as Consulting Physician (Ophthalmology) [name], MD as Referring Physician (Psychiatry) [name], MD as Consulting Physician (General Surgery) [name], MD as Consulting Physician (Orthopedic Surgery) [name], [name], NP as Nurse Practitioner (Family Medicine)  Extended Emergency Contact Information Primary Emergency Contact: Koenigs,[name] Address: [address]          [place], [place] [address] [place] Phone: [phone] Relation: Spouse  Code Status:  DNR  Goals of care: Advanced Directive information Advanced Directives 08/08/2017  Does Patient Have a Medical Advance Directive? Yes  Type of Paramedic of [place];Living will;Out of facility DNR (pink MOST or yellow form)  Does patient want to make changes to medical advance directive? No - Patient declined  Copy of [place] in Chart? Yes  Pre-existing out of facility DNR order (yellow form or pink MOST form) Yellow form placed in chart (order not valid for inpatient use);Pink MOST form placed in chart (order not valid for inpatient use)     Chief Complaint  Patient presents with  . Acute Visit    Left eye redness; discharge     HPI:  Pt is a 84 y.o. male seen today for an acute visit for left eye pain with redness and  discharge. Pt seen in his room with charge nurse and patient's wife present. He complaints of discomfort to left eye for a week or so. He complaints of some itching and drainage. Staff noticed redness and yellow drainage from his left eye yesterday. He denies any fever or chills. No pain or discomfort to right eye. Of note, pt has had similar presentation in the past and erythromycin has helped resolve the symptom. Last episode was in April 2019.    Past Medical History:  Diagnosis Date  . Acute urinary retention 01/28/2013  . AKI (acute kidney injury) ([place]) 01/26/2013  . Altered mental status 09/09/2013  . Anemia    B12 deficiency  . ANXIETY 06/07/2007   Qualifier: Diagnosis of  By: Talbert Cage CMA ([place]), June    . ARTHRITIS 06/07/2007   Qualifier: Diagnosis of  By: Talbert Cage CMA ([place]), June    . Balance problems 04/05/2011  . Benign essential tremor   . Benign fibroma of prostate 10/11/2011  . Bilateral inguinal hernia 10/09/2012  . Bilateral leg edema 09/13/2012  . Bladder retention 02/05/2013  . Bradycardia, sinus 08/02/2012  . CAROTID ARTERY DISEASE 03/31/2007   Qualifier: Diagnosis of  By: Linna Darner MD, Gwyndolyn Saxon    . Carotid stenosis    s/p L CEA  . Cataract, nuclear 03/21/2014  . 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 Cervical spinal stenosis   . Cervical stenosis of spine   . Critical lower limb ischemia   . Degeneration macular 11/02/2011  . Depression    Dr Albertine Patricia  . Difficulty in walking(719.7) 04/05/2011  . ESOPHAGITIS 08/06/2002   Qualifier: Diagnosis of  By: Talbert Cage  CMA ([facility]), June    . Essential and other specified forms of tremor 04/18/2013  . Fall at home 01/06/2017   left hip pain  . Falls   . Family history of colon cancer 07/24/2012  . Fecal impaction ([place]) 10/11/2012  . Femoral hernia, bilateral s/p lap repair 01/16/2013 01/16/2013  . Glaucoma, compensated 03/21/2014  . H/O cardiovascular stress test    a. 02/2003 -> no ischemia/infarct.  Marland Kitchen HAMMER TOE 04/23/2010   Qualifier:  Diagnosis of  By: Linna Darner MD, Gwyndolyn Saxon    . Hematoma of leg   . Hip hematoma, right 09/09/2013  . History of shingles 2009  . HTN (hypertension) 08/22/2012  . Hx of echocardiogram    Echocardiogram 08/01/12: Mild LVH, EF 55-60%, normal wall motion.  . Hydrocele 10/11/2011  . Hyperlipidemia   . 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 Hypothyroidism   . Macular degeneration disease   . Macular edema   . Migraines    Dr Jannifer Franklin  . MORTON'S NEUROMA, LEFT 08/22/2007   Qualifier: Diagnosis of  By: Linna Darner MD, Gwyndolyn Saxon    . PAD (peripheral artery disease) ([place])   . Peripheral neuropathy   . Personal history of colonic polyps 01/05/2013   2014 2 mm adenomatous polyp   . PREMATURE ATRIAL CONTRACTIONS 03/14/2006   Qualifier: Diagnosis of  By: Linna Darner MD, Gwendolyn Lima 12/16/2011  . SPINAL STENOSIS 03/14/2006   Qualifier: Diagnosis of  By: Linna Darner MD, William   Cervical spine    . Testosterone deficiency    Dr Lawerance Bach, [facility]  . Unspecified vitamin D deficiency 08/06/2012  . Urge incontinence 10/09/2012  . Urinary tract infection, site not specified 02/13/2013  . Weakness 08/02/2012   Past Surgical History:  Procedure Laterality Date  . APPENDECTOMY  1941  . BREAST CYST EXCISION Left 01/16/2013   Procedure: MASS EXCISION AXILLA;  Surgeon: Adin Hector, MD;  Location: [facility];  Service: General;  Laterality: Left;  . CAROTID ENDARTERECTOMY  2005    L  . CATARACT EXTRACTION Right   . COLONOSCOPY  2014   Dr. Deatra Ina  . EYE SURGERY Right 02/2012   retina  . INGUINAL HERNIA REPAIR Bilateral 01/16/2013   Procedure: LAPAROSCOPIC BILATERAL INGUINAL DIRECT AND INDIRECT, FEMORAL, AND OBTURATOR HERNIAS;  Surgeon: Adin Hector, MD;  Location: [facility];  Service: General;  Laterality: Bilateral;  . INSERTION OF MESH N/A 01/16/2013   Procedure: INSERTION OF MESH;  Surgeon: Adin Hector, MD;  Location: [facility];  Service: General;  Laterality: N/A;  . LUMBAR LAMINECTOMY    . TONSILLECTOMY AND  ADENOIDECTOMY    . TOTAL HIP ARTHROPLASTY  2005   L    Allergies  Allergen Reactions  . Latex Rash  . Tape Rash    Paper tape is ok to use     Outpatient Encounter Medications as of 08/08/2017  Medication Sig  . acetaminophen (TYLENOL) 325 MG tablet Take 2 tablets (650 mg total) by mouth every 6 (six) hours as needed for mild pain (or Fever >/= 101).  Marland Kitchen acetaminophen (TYLENOL) 500 MG tablet Take 1,000 mg by mouth 2 (two) times daily.  Marland Kitchen amLODipine (NORVASC) 5 MG tablet Take 5 mg by mouth daily.  Marland Kitchen aspirin 81 MG tablet Take 81 mg by mouth every morning.   Marland Kitchen atorvastatin (LIPITOR) 20 MG tablet TAKE 1 TABLET BY MOUTH DAILY FOR CHOLESTEROL  . B Complex-C (B-COMPLEX WITH VITAMIN C) tablet Take 1 tablet by mouth daily.  . bimatoprost (  LUMIGAN) 0.01 % SOLN Place 1 drop into both eyes at bedtime.  Marland Kitchen buPROPion (WELLBUTRIN XL) 300 MG 24 hr tablet Take 300 mg by mouth daily.  . Cholecalciferol (VITAMIN D3) 1000 units CAPS Take 2,000 Units by mouth daily.   . fluticasone (CUTIVATE) 0.05 % cream Apply 1 application topically 2 (two) times daily as needed (irritation).   Marland Kitchen ketoconazole (NIZORAL) 2 % cream Apply 1 application topically daily as needed for irritation.   Javier Docker Oil 500 MG CAPS Take 500 mg by mouth every morning.   Marland Kitchen levothyroxine (SYNTHROID, LEVOTHROID) 50 MCG tablet Take 50 mcg by mouth daily before breakfast. Take one tablet daily except on Sunday's take 1/2 tablet.  . linaclotide (LINZESS) 145 MCG CAPS capsule Take 1 capsule (145 mcg total) by mouth daily before breakfast.  . Melatonin 3 MG TABS Take 1 tablet by mouth at bedtime.  . mirtazapine (REMERON) 30 MG tablet Take 1 tablet (30 mg total) by mouth at bedtime.  . Multiple Vitamins-Minerals (ICAPS PO) Take 1 tablet by mouth daily.  . polyethylene glycol (MIRALAX / GLYCOLAX) packet Take 17 g by mouth daily as needed for mild constipation.  . sertraline (ZOLOFT) 100 MG tablet Take 200 mg by mouth every morning.   . tamsulosin  (FLOMAX) 0.4 MG CAPS capsule TAKE 1 CAPSULE (0.4 MG TOTAL) BY MOUTH DAILY.  Marland Kitchen topiramate (TOPAMAX) 25 MG tablet TAKE 1 TABLET BY MOUTH EVERY MORNING AND 2 TABLETS BY MOUTH EVERY EVENING  . zinc oxide 20 % ointment Apply 1 application topically as needed.   . [DISCONTINUED] buPROPion (WELLBUTRIN XL) 150 MG 24 hr tablet Take 150 mg by mouth daily.    No facility-administered encounter medications on file as of 08/08/2017.     Review of Systems  Constitutional: Negative for chills, fatigue and fever.  Eyes: Positive for pain, discharge, redness and itching. Negative for photophobia.       Has corrective glasses, has macular degeneration  Respiratory: Negative for shortness of breath.   Cardiovascular: Negative for chest pain.  Musculoskeletal: Positive for gait problem.  Skin: Negative for rash.  Neurological: Negative for dizziness and headaches.    Immunization History  Administered Date(s) Administered  . DTaP 11/05/2005  . Hepatitis B 08-28-1932  . Influenza Split 11/12/2011, 11/29/2013  . Influenza Whole 10/30/2007, 11/12/2008, 11/26/2009  . Influenza, High Dose Seasonal PF 12/31/2015, 11/24/2016  . Influenza,inj,quad, With Preservative 10/16/2016  . PPD Test 06/23/2011  . Pneumococcal Conjugate-13 01/14/2016  . Pneumococcal Polysaccharide-23 02/16/1999  . Tdap 01/13/2017  . Varicella 11/09/2005  . Zoster 08/30/2006   Pertinent  Health Maintenance Due  Topic Date Due  . INFLUENZA VACCINE  09/15/2017  . PNA vac Low Risk Adult  Completed   Fall Risk  10/27/2016 06/09/2016 02/03/2016 10/28/2015 01/16/2015  Falls in the past year? Yes Yes Yes Yes Yes  Number falls in past yr: 2 or more 2 or more 2 or more 2 or more 2 or more  Comment - - 2 falls in last 2 months - -  Injury with Fall? Yes No Yes No (No Data)  Comment L hip - scraps and bruises - head  Risk Factor Category  - - - - -  Risk for fall due to : - - - Impaired balance/gait -  Follow up - - - - -   Functional  Status Survey:    Vitals:   08/08/17 1112  BP: (!) 102/58  Pulse: 68  Resp: 18  Temp: 98.4 F (  36.9 C)  TempSrc: Oral  SpO2: 96%  Weight: 150 lb 3.2 oz (68.1 kg)  Height: 6' (1.829 m)   Body mass index is 20.37 kg/m.   Physical Exam  Constitutional:  Thin built, frail, elderly male in no acute distress  HENT:  Head: Normocephalic and atraumatic.  Mouth/Throat: Oropharynx is clear and moist.  Eyes: EOM are normal. Right eye exhibits no discharge. Left eye exhibits discharge.  Erythema to left conjunctiva with yellow crust to eyelashes  Cardiovascular: Normal rate and regular rhythm.  Pulmonary/Chest: Effort normal and breath sounds normal.  Abdominal: Soft. Bowel sounds are normal.  Skin: He is not diaphoretic.    Labs reviewed: Recent Labs    01/08/17 1356 01/10/17 1528 01/11/17 0532 01/24/17 04/14/17  NA 140 140 141 140 142  142  K 4.1 4.8 4.1 4.4 4.4  4.4  CL 111 112* 111  --   --   CO2 23 21* 25  --   --   GLUCOSE 95 87 92  --   --   BUN 35* 30* 28* 28* 34*  CREATININE 1.26* 1.13 1.07 1.2 1.3  1.26  CALCIUM 8.7* 8.8* 8.3*  --  8.8   Recent Labs    01/10/17 1528 01/11/17 0532 01/24/17 04/14/17  AST 18 16 12* 15  15  ALT 17 15* 10 15  15   ALKPHOS 38 38 114 40  40  BILITOT 0.7 0.7  --  0.4  PROT 6.2* 5.3*  --  5.4  ALBUMIN 3.2* 2.8*  --  3.3   Recent Labs    01/08/17 1356 01/10/17 1528 01/11/17 0532 01/24/17 04/14/17  WBC 6.6 6.9 6.3 6.3 5.7  5.7  NEUTROABS 4.6 5.3  --   --   --   HGB 12.4* 12.4* 11.9* 13.4* 14.0  14.0  HCT 38.6* 39.3 38.1* 42 42  42.1  MCV 96.5 97.3 96.2  --   --   PLT 141* 152 143* 239 134*   Lab Results  Component Value Date   TSH 1.87 06/16/2017   Lab Results  Component Value Date   HGBA1C 5.3 07/31/2012   Lab Results  Component Value Date   CHOL 125 05/16/2017   CHOL 125 05/16/2017   HDL 34 (A) 05/16/2017   LDLCALC 74 05/16/2017   LDLCALC 74 05/16/2017   LDLDIRECT 132.5 09/08/2012   TRIG 88 05/16/2017    TRIG 88 05/16/2017   CHOLHDL 2.7 11/15/2016    Significant Diagnostic Results in last 30 days:  No results found.  Assessment/Plan  1. Acute conjunctivitis of left eye, unspecified acute conjunctivitis type Clean eyelids and lashes with sterile gauze soaked in luke warm water to remove the dirt before applying erythromycin ointment 0.5% 1cm ribbon tid to left eye for a week and monitor. Given recurrence of conjunctival infection, will have him be seen by ophthalmology.     Family/ staff Communication: reviewed care plan with patient, his wife and charge nurse.    Labs/tests ordered:  none  [name], MD Internal Medicine [facility] [address] [place], [place] [address] Cell Phone (Monday-Friday 8 am - 5 pm): [phone] On Call: [phone] and follow prompts after 5 pm and on weekends Office Phone: [phone] Office Fax: [phone]

## 2017-08-10 ENCOUNTER — Encounter: Payer: Medicare Other | Admitting: Internal Medicine

## 2017-08-10 ENCOUNTER — Encounter: Payer: Self-pay | Admitting: Internal Medicine

## 2017-08-22 ENCOUNTER — Non-Acute Institutional Stay (SKILLED_NURSING_FACILITY): Payer: Medicare Other | Admitting: Family

## 2017-08-22 ENCOUNTER — Encounter: Payer: Self-pay | Admitting: Family

## 2017-08-22 DIAGNOSIS — N50812 Left testicular pain: Secondary | ICD-10-CM

## 2017-08-22 DIAGNOSIS — N433 Hydrocele, unspecified: Secondary | ICD-10-CM

## 2017-08-22 NOTE — Progress Notes (Signed)
Location:  [facility] Room Number: 34 Place of Service:  SNF [phone]) Provider: Zareya Tuckett FNP-C  [name], MD  Patient Care Team: [name], MD as PCP - General (Internal Medicine) [name], MD as Consulting Physician (Urology) [name], MD as Consulting Physician (Neurology) [name], MD as Consulting Physician (Dermatology) [name], MD (Inactive) as Consulting Physician (Gastroenterology) [name], MD as Consulting Physician (Orthopedic Surgery) [name], MD as Consulting Physician (Cardiology) [name], MD as Consulting Physician (Ophthalmology) [name], MD as Referring Physician (Psychiatry) [name], MD as Consulting Physician (General Surgery) [name], MD as Consulting Physician (Orthopedic Surgery) Cassiopeia Florentino, [name], NP as Nurse Practitioner (Family Medicine)  Extended Emergency Contact Information Primary Emergency Contact: Abascal,[name] Address: [address]          [place]          [place], [place] [address] [place] Phone: [phone] Relation: Spouse  Code Status:  DNR Goals of care: Advanced Directive information Advanced Directives 08/22/2017  Does Patient Have a Medical Advance Directive? Yes  Type of Paramedic of [place];Out of facility DNR (pink MOST or yellow form);Living will  Does patient want to make changes to medical advance directive? -  Copy of [place] in Chart? Yes  Pre-existing out of facility DNR order (yellow form or pink MOST form) Yellow form placed in chart (order not valid for inpatient use);Pink MOST form placed in chart (order not valid for inpatient use)     Chief Complaint  Patient presents with  . Acute Visit    testicular pain    HPI:  Pt is a 84 y.o. male seen today at [facility] for an acute visit for evaluation of testicular pain.He states pain  started after peri-care.He describe pain as generalized.He denies any fever,chills or reduce size of testicle.of note he has a medical history of Hydrocele and used to follow up with Urologist Dr.davis per patient's wife present at bedside during visit.   Past Medical History:  Diagnosis Date  . Acute urinary retention 01/28/2013  . AKI (acute kidney injury) ([place]) 01/26/2013  . Altered mental status 09/09/2013  . Anemia    B12 deficiency  . ANXIETY 06/07/2007   Qualifier: Diagnosis of  By: Talbert Cage CMA ([place]), June    . ARTHRITIS 06/07/2007   Qualifier: Diagnosis of  By: Talbert Cage CMA ([place]), June    . Balance problems 04/05/2011  . Benign essential tremor   . Benign fibroma of prostate 10/11/2011  . Bilateral inguinal hernia 10/09/2012  . Bilateral leg edema 09/13/2012  . Bladder retention 02/05/2013  . Bradycardia, sinus 08/02/2012  . CAROTID ARTERY DISEASE 03/31/2007   Qualifier: Diagnosis of  By: Linna Darner MD, Gwyndolyn Saxon    . Carotid stenosis    s/p L CEA  . Cataract, nuclear 03/21/2014  . 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 Cervical spinal stenosis   . Cervical stenosis of spine   . Critical lower limb ischemia   . Degeneration macular 11/02/2011  . Depression    Dr Albertine Patricia  . Difficulty in walking(719.7) 04/05/2011  . ESOPHAGITIS 08/06/2002   Qualifier: Diagnosis of  By: Talbert Cage CMA ([place]), June    . Essential and other specified forms of tremor 04/18/2013  . Fall at home 01/06/2017   left hip pain  . Falls   . Family history of colon cancer 07/24/2012  . Fecal impaction ([place]) 10/11/2012  .  Femoral hernia, bilateral s/p lap repair 01/16/2013 01/16/2013  . Glaucoma, compensated 03/21/2014  . H/O cardiovascular stress test    a. 02/2003 -> no ischemia/infarct.  Marland Kitchen HAMMER TOE 04/23/2010   Qualifier: Diagnosis of  By: Linna Darner MD, Gwyndolyn Saxon    . Hematoma of leg   . Hip hematoma, right 09/09/2013  . History of shingles 2009  . HTN (hypertension) 08/22/2012  . Hx of echocardiogram    Echocardiogram  08/01/12: Mild LVH, EF 55-60%, normal wall motion.  . Hydrocele 10/11/2011  . Hyperlipidemia   . 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 Hypothyroidism   . Macular degeneration disease   . Macular edema   . Migraines    Dr Jannifer Franklin  . MORTON'S NEUROMA, LEFT 08/22/2007   Qualifier: Diagnosis of  By: Linna Darner MD, Gwyndolyn Saxon    . PAD (peripheral artery disease) ([place])   . Peripheral neuropathy   . Personal history of colonic polyps 01/05/2013   2014 2 mm adenomatous polyp   . PREMATURE ATRIAL CONTRACTIONS 03/14/2006   Qualifier: Diagnosis of  By: Linna Darner MD, Gwendolyn Lima 12/16/2011  . SPINAL STENOSIS 03/14/2006   Qualifier: Diagnosis of  By: Linna Darner MD, William   Cervical spine    . Testosterone deficiency    Dr Lawerance Bach, [facility]  . Unspecified vitamin D deficiency 08/06/2012  . Urge incontinence 10/09/2012  . Urinary tract infection, site not specified 02/13/2013  . Weakness 08/02/2012   Past Surgical History:  Procedure Laterality Date  . APPENDECTOMY  1941  . BREAST CYST EXCISION Left 01/16/2013   Procedure: MASS EXCISION AXILLA;  Surgeon: Adin Hector, MD;  Location: [facility];  Service: General;  Laterality: Left;  . CAROTID ENDARTERECTOMY  2005    L  . CATARACT EXTRACTION Right   . COLONOSCOPY  2014   Dr. Deatra Ina  . EYE SURGERY Right 02/2012   retina  . INGUINAL HERNIA REPAIR Bilateral 01/16/2013   Procedure: LAPAROSCOPIC BILATERAL INGUINAL DIRECT AND INDIRECT, FEMORAL, AND OBTURATOR HERNIAS;  Surgeon: Adin Hector, MD;  Location: [facility];  Service: General;  Laterality: Bilateral;  . INSERTION OF MESH N/A 01/16/2013   Procedure: INSERTION OF MESH;  Surgeon: Adin Hector, MD;  Location: [facility];  Service: General;  Laterality: N/A;  . LUMBAR LAMINECTOMY    . TONSILLECTOMY AND ADENOIDECTOMY    . TOTAL HIP ARTHROPLASTY  2005   L    Allergies  Allergen Reactions  . Latex Rash  . Tape Rash    Paper tape is ok to use     Outpatient Encounter Medications as of 08/22/2017    Medication Sig  . acetaminophen (TYLENOL) 325 MG tablet Take 2 tablets (650 mg total) by mouth every 6 (six) hours as needed for mild pain (or Fever >/= 101).  Marland Kitchen acetaminophen (TYLENOL) 500 MG tablet Take 1,000 mg by mouth 2 (two) times daily.  Marland Kitchen amLODipine (NORVASC) 5 MG tablet Take 5 mg by mouth daily.  Marland Kitchen aspirin 81 MG tablet Take 81 mg by mouth every morning.   Marland Kitchen atorvastatin (LIPITOR) 20 MG tablet TAKE 1 TABLET BY MOUTH DAILY FOR CHOLESTEROL  . B Complex-C (B-COMPLEX WITH VITAMIN C) tablet Take 1 tablet by mouth daily.  . bimatoprost (LUMIGAN) 0.01 % SOLN Place 1 drop into both eyes at bedtime.  Marland Kitchen buPROPion (WELLBUTRIN XL) 300 MG 24 hr tablet Take 300 mg by mouth daily.  . Cholecalciferol (VITAMIN D3) 1000 units CAPS Take 2,000 Units by mouth daily.   Marland Kitchen  fluticasone (CUTIVATE) 0.05 % cream Apply 1 application topically 2 (two) times daily as needed (irritation).   Marland Kitchen ketoconazole (NIZORAL) 2 % cream Apply 1 application topically daily as needed for irritation.   Javier Docker Oil 500 MG CAPS Take 500 mg by mouth every morning.   Marland Kitchen levothyroxine (SYNTHROID, LEVOTHROID) 50 MCG tablet Take 50 mcg by mouth daily before breakfast. Take one tablet daily except on Sunday's take 1/2 tablet.  . linaclotide (LINZESS) 145 MCG CAPS capsule Take 1 capsule (145 mcg total) by mouth daily before breakfast.  . Melatonin 3 MG TABS Take 1 tablet by mouth at bedtime.  . mirtazapine (REMERON) 30 MG tablet Take 1 tablet (30 mg total) by mouth at bedtime.  . Multiple Vitamins-Minerals (ICAPS PO) Take 1 tablet by mouth daily.  Vladimir Faster Glycol-Propyl Glycol (SYSTANE) 0.4-0.3 % SOLN Apply 1 drop to eye 4 (four) times daily.  . polyethylene glycol (MIRALAX / GLYCOLAX) packet Take 17 g by mouth daily as needed for mild constipation.  . sertraline (ZOLOFT) 100 MG tablet Take 200 mg by mouth every morning.   . tamsulosin (FLOMAX) 0.4 MG CAPS capsule TAKE 1 CAPSULE (0.4 MG TOTAL) BY MOUTH DAILY.  Marland Kitchen topiramate (TOPAMAX) 25  MG tablet TAKE 1 TABLET BY MOUTH EVERY MORNING AND 2 TABLETS BY MOUTH EVERY EVENING  . zinc oxide 20 % ointment Apply 1 application topically as needed.    No facility-administered encounter medications on file as of 08/22/2017.     Review of Systems  Constitutional: Negative for chills, fatigue, fever and unexpected weight change.  Respiratory: Negative for cough, chest tightness, shortness of breath and wheezing.   Cardiovascular: Negative for chest pain, palpitations and leg swelling.  Gastrointestinal: Negative for abdominal distention, abdominal pain, constipation, diarrhea, nausea and vomiting.  Genitourinary: Positive for testicular pain. Negative for difficulty urinating, dysuria, frequency, hematuria and urgency.  Musculoskeletal: Positive for gait problem. Negative for back pain.  Skin: Negative for color change, pallor and rash.  Psychiatric/Behavioral: Negative for agitation, confusion and sleep disturbance. The patient is not nervous/anxious.     Immunization History  Administered Date(s) Administered  . DTaP 11/05/2005  . Hepatitis B Jul 29, 1932  . Influenza Split 11/12/2011, 11/29/2013  . Influenza Whole 10/30/2007, 11/12/2008, 11/26/2009  . Influenza, High Dose Seasonal PF 12/31/2015, 11/24/2016  . Influenza,inj,quad, With Preservative 10/16/2016  . PPD Test 06/23/2011  . Pneumococcal Conjugate-13 01/14/2016  . Pneumococcal Polysaccharide-23 02/16/1999  . Tdap 01/13/2017  . Varicella 11/09/2005  . Zoster 08/30/2006   Pertinent  Health Maintenance Due  Topic Date Due  . INFLUENZA VACCINE  09/15/2017  . PNA vac Low Risk Adult  Completed   Fall Risk  10/27/2016 06/09/2016 02/03/2016 10/28/2015 01/16/2015  Falls in the past year? Yes Yes Yes Yes Yes  Number falls in past yr: 2 or more 2 or more 2 or more 2 or more 2 or more  Comment - - 2 falls in last 2 months - -  Injury with Fall? Yes No Yes No (No Data)  Comment L hip - scraps and bruises - head  Risk Factor  Category  - - - - -  Risk for fall due to : - - - Impaired balance/gait -  Follow up - - - - -   Functional Status Survey:    Vitals:   08/22/17 1428  BP: 111/68  Pulse: (!) 59  Resp: 18  Temp: 97.7 F (36.5 C)  SpO2: 95%  Weight: 153 lb (69.4 kg)  Height:  6' (1.829 m)   Body mass index is 20.75 kg/m. Physical Exam  Constitutional:  Thin built elderly in no acute distress   HENT:  Head: Normocephalic.  Mouth/Throat: Oropharynx is clear and moist. No oropharyngeal exudate.  Eyes: Pupils are equal, round, and reactive to light. EOM are normal. Right eye exhibits no discharge. Left eye exhibits no discharge. No scleral icterus.  Cardiovascular: Normal rate, regular rhythm, normal heart sounds and intact distal pulses. Exam reveals no gallop and no friction rub.  No murmur heard. Pulmonary/Chest: Effort normal and breath sounds normal. No respiratory distress. He has no wheezes. He has no rales.  Abdominal: Soft. Bowel sounds are normal. He exhibits no distension and no mass. There is no tenderness. There is no rebound and no guarding. No hernia.  Genitourinary: Testes normal. Right testis shows no mass, no swelling and no tenderness. Left testis shows no mass. No penile erythema or penile tenderness. No discharge found.  Genitourinary Comments: Left scrotum swelling and generalized tenderness to touch.  Skin: Skin is warm and dry. No rash noted. No erythema. No pallor.  Psychiatric: He has a normal mood and affect. His behavior is normal. Thought content normal.  Nursing note and vitals reviewed.   Labs reviewed: Recent Labs    01/08/17 1356 01/10/17 1528 01/11/17 0532 01/24/17 04/14/17  NA 140 140 141 140 142  142  K 4.1 4.8 4.1 4.4 4.4  4.4  CL 111 112* 111  --   --   CO2 23 21* 25  --   --   GLUCOSE 95 87 92  --   --   BUN 35* 30* 28* 28* 34*  CREATININE 1.26* 1.13 1.07 1.2 1.3  1.26  CALCIUM 8.7* 8.8* 8.3*  --  8.8   Recent Labs    01/10/17 1528  01/11/17 0532 01/24/17 04/14/17  AST 18 16 12* 15  15  ALT 17 15* 10 15  15   ALKPHOS 38 38 114 40  40  BILITOT 0.7 0.7  --  0.4  PROT 6.2* 5.3*  --  5.4  ALBUMIN 3.2* 2.8*  --  3.3   Recent Labs    01/08/17 1356 01/10/17 1528 01/11/17 0532 01/24/17 04/14/17  WBC 6.6 6.9 6.3 6.3 5.7  5.7  NEUTROABS 4.6 5.3  --   --   --   HGB 12.4* 12.4* 11.9* 13.4* 14.0  14.0  HCT 38.6* 39.3 38.1* 42 42  42.1  MCV 96.5 97.3 96.2  --   --   PLT 141* 152 143* 239 134*   Lab Results  Component Value Date   TSH 1.87 06/16/2017   Lab Results  Component Value Date   HGBA1C 5.3 07/31/2012   Lab Results  Component Value Date   CHOL 125 05/16/2017   CHOL 125 05/16/2017   HDL 34 (A) 05/16/2017   LDLCALC 74 05/16/2017   LDLCALC 74 05/16/2017   LDLDIRECT 132.5 09/08/2012   TRIG 88 05/16/2017   TRIG 88 05/16/2017   CHOLHDL 2.7 11/15/2016    Significant Diagnostic Results in last 30 days:  No results found.  Assessment/Plan 1. Testicular pain, left Afebrile.No signs of infections.Patient wife request referral to previous urologist Dr.Davis.Facility Nurse to contact urologist office for referral appointment ASAP.continue on current pain regimen.continue to monitor.   2. Hydrocele Chronic per wife was following up with [facility] Urologist in the past.no signs of infections.follow up with Urologist as above.   Family/ staff Communication: Reviewed plan of care with patient and  facility Nurse.   Labs/tests ordered: None   Pressley Barsky C Orlyn Odonoghue, NP

## 2017-08-29 ENCOUNTER — Non-Acute Institutional Stay (SKILLED_NURSING_FACILITY): Payer: Medicare Other | Admitting: Internal Medicine

## 2017-08-29 ENCOUNTER — Encounter: Payer: Self-pay | Admitting: Internal Medicine

## 2017-08-29 DIAGNOSIS — I10 Essential (primary) hypertension: Secondary | ICD-10-CM

## 2017-08-29 DIAGNOSIS — K5909 Other constipation: Secondary | ICD-10-CM | POA: Diagnosis not present

## 2017-08-29 DIAGNOSIS — E782 Mixed hyperlipidemia: Secondary | ICD-10-CM

## 2017-08-29 DIAGNOSIS — E039 Hypothyroidism, unspecified: Secondary | ICD-10-CM | POA: Diagnosis not present

## 2017-08-29 DIAGNOSIS — L89152 Pressure ulcer of sacral region, stage 2: Secondary | ICD-10-CM | POA: Diagnosis not present

## 2017-08-29 DIAGNOSIS — N183 Chronic kidney disease, stage 3 unspecified: Secondary | ICD-10-CM

## 2017-08-29 NOTE — Progress Notes (Signed)
Location:  [facility] Room Number: 34 Place of Service:  SNF [phone]) Provider:  [name] MD  [name], MD  Patient Care Team: [name], MD as PCP - General (Internal Medicine) [name], MD as Consulting Physician (Urology) [name], MD as Consulting Physician (Neurology) [name], MD as Consulting Physician (Dermatology) [name], MD (Inactive) as Consulting Physician (Gastroenterology) [name], MD as Consulting Physician (Orthopedic Surgery) [name], MD as Consulting Physician (Cardiology) [name], MD as Consulting Physician (Ophthalmology) [name], MD as Referring Physician (Psychiatry) [name], MD as Consulting Physician (General Surgery) [name], MD as Consulting Physician (Orthopedic Surgery) [name], [name], NP as Nurse Practitioner (Family Medicine)  Extended Emergency Contact Information Primary Emergency Contact: Nicolaou,[name] Address: [address]          [place], [place] [address] [place] Phone: [phone] Relation: Spouse  Code Status:  DNR  Goals of care: Advanced Directive information Advanced Directives 08/29/2017  Does Patient Have a Medical Advance Directive? Yes  Type of Paramedic of [place];Living will;Out of facility DNR (pink MOST or yellow form)  Does patient want to make changes to medical advance directive? No - Patient declined  Copy of [place] in Chart? Yes  Pre-existing out of facility DNR order (yellow form or pink MOST form) Yellow form placed in chart (order not valid for inpatient use);Pink MOST form placed in chart (order not valid for inpatient use)     Chief Complaint  Patient presents with  . Medical Management of Chronic Issues    Routine Visit     HPI:  Pt is a 84 y.o. male seen today for medical management of chronic diseases. He is  seen in his room. He complaints of being constipated. He also complaints of soreness to his bottom. He takes levothyroxine for hypothyroidism and is tolerating it well. Few elevated BP reading beginning of July but in last week review, stable and controlled blood pressure reading. Mood has been stable, follows with psychiatry service.    Past Medical History:  Diagnosis Date  . Acute urinary retention 01/28/2013  . AKI (acute kidney injury) ([facility]) 01/26/2013  . Altered mental status 09/09/2013  . Anemia    B12 deficiency  . ANXIETY 06/07/2007   Qualifier: Diagnosis of  By: Talbert Cage CMA ([place]), June    . ARTHRITIS 06/07/2007   Qualifier: Diagnosis of  By: Talbert Cage CMA ([place]), June    . Balance problems 04/05/2011  . Benign essential tremor   . Benign fibroma of prostate 10/11/2011  . Bilateral inguinal hernia 10/09/2012  . Bilateral leg edema 09/13/2012  . Bladder retention 02/05/2013  . Bradycardia, sinus 08/02/2012  . CAROTID ARTERY DISEASE 03/31/2007   Qualifier: Diagnosis of  By: Linna Darner MD, Gwyndolyn Saxon    . Carotid stenosis    s/p L CEA  . Cataract, nuclear 03/21/2014  . 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 Cervical spinal stenosis   . Cervical stenosis of spine   . Critical lower limb ischemia   . Degeneration macular 11/02/2011  . Depression    Dr Albertine Patricia  . Difficulty in walking(719.7) 04/05/2011  . ESOPHAGITIS 08/06/2002   Qualifier: Diagnosis of  By: Talbert Cage CMA ([place]), June    . Essential and other specified forms of tremor 04/18/2013  . Fall at home 01/06/2017   left hip pain  .  Falls   . Family history of colon cancer 07/24/2012  . Fecal impaction ([facility]) 10/11/2012  . Femoral hernia, bilateral s/p lap repair 01/16/2013 01/16/2013  . Glaucoma, compensated 03/21/2014  . H/O cardiovascular stress test    a. 02/2003 -> no ischemia/infarct.  Marland Kitchen HAMMER TOE 04/23/2010   Qualifier: Diagnosis of  By: Linna Darner MD, Gwyndolyn Saxon    . Hematoma of leg   . Hip hematoma, right 09/09/2013  . History of shingles  2009  . HTN (hypertension) 08/22/2012  . Hx of echocardiogram    Echocardiogram 08/01/12: Mild LVH, EF 55-60%, normal wall motion.  . Hydrocele 10/11/2011  . Hyperlipidemia   . 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 Hypothyroidism   . Macular degeneration disease   . Macular edema   . Migraines    Dr Jannifer Franklin  . MORTON'S NEUROMA, LEFT 08/22/2007   Qualifier: Diagnosis of  By: Linna Darner MD, Gwyndolyn Saxon    . PAD (peripheral artery disease) ([facility])   . Peripheral neuropathy   . Personal history of colonic polyps 01/05/2013   2014 2 mm adenomatous polyp   . PREMATURE ATRIAL CONTRACTIONS 03/14/2006   Qualifier: Diagnosis of  By: Linna Darner MD, Gwendolyn Lima 12/16/2011  . SPINAL STENOSIS 03/14/2006   Qualifier: Diagnosis of  By: Linna Darner MD, William   Cervical spine    . Testosterone deficiency    Dr Lawerance Bach, [facility]  . Unspecified vitamin D deficiency 08/06/2012  . Urge incontinence 10/09/2012  . Urinary tract infection, site not specified 02/13/2013  . Weakness 08/02/2012   Past Surgical History:  Procedure Laterality Date  . APPENDECTOMY  1941  . BREAST CYST EXCISION Left 01/16/2013   Procedure: MASS EXCISION AXILLA;  Surgeon: Adin Hector, MD;  Location: [facility];  Service: General;  Laterality: Left;  . CAROTID ENDARTERECTOMY  2005    L  . CATARACT EXTRACTION Right   . COLONOSCOPY  2014   Dr. Deatra Ina  . EYE SURGERY Right 02/2012   retina  . INGUINAL HERNIA REPAIR Bilateral 01/16/2013   Procedure: LAPAROSCOPIC BILATERAL INGUINAL DIRECT AND INDIRECT, FEMORAL, AND OBTURATOR HERNIAS;  Surgeon: Adin Hector, MD;  Location: [facility];  Service: General;  Laterality: Bilateral;  . INSERTION OF MESH N/A 01/16/2013   Procedure: INSERTION OF MESH;  Surgeon: Adin Hector, MD;  Location: [facility];  Service: General;  Laterality: N/A;  . LUMBAR LAMINECTOMY    . TONSILLECTOMY AND ADENOIDECTOMY    . TOTAL HIP ARTHROPLASTY  2005   L    Allergies  Allergen Reactions  . Latex Rash  . Tape Rash    Paper  tape is ok to use     Outpatient Encounter Medications as of 08/29/2017  Medication Sig  . acetaminophen (TYLENOL) 500 MG tablet Take 1,000 mg by mouth 2 (two) times daily.  Marland Kitchen amLODipine (NORVASC) 5 MG tablet Take 5 mg by mouth daily.  Marland Kitchen aspirin 81 MG tablet Take 81 mg by mouth every morning.   Marland Kitchen atorvastatin (LIPITOR) 20 MG tablet TAKE 1 TABLET BY MOUTH DAILY FOR CHOLESTEROL  . B Complex-C (B-COMPLEX WITH VITAMIN C) tablet Take 1 tablet by mouth daily.  . bimatoprost (LUMIGAN) 0.01 % SOLN Place 1 drop into both eyes at bedtime.  Marland Kitchen buPROPion (WELLBUTRIN XL) 300 MG 24 hr tablet Take 300 mg by mouth daily.  . Cholecalciferol (VITAMIN D3) 1000 units CAPS Take 2,000 Units by mouth daily.   . clonazePAM (KLONOPIN) 0.5 MG tablet Take 0.5 mg by mouth at  bedtime as needed for anxiety.  . fluticasone (CUTIVATE) 0.05 % cream Apply 1 application topically 2 (two) times daily as needed (irritation).   Marland Kitchen ketoconazole (NIZORAL) 2 % cream Apply 1 application topically daily as needed for irritation.   Javier Docker Oil 500 MG CAPS Take 500 mg by mouth every morning.   Marland Kitchen levothyroxine (SYNTHROID, LEVOTHROID) 50 MCG tablet Take 50 mcg by mouth daily before breakfast. Take one tablet daily except on Sunday's take 1/2 tablet.  . linaclotide (LINZESS) 145 MCG CAPS capsule Take 1 capsule (145 mcg total) by mouth daily before breakfast.  . Melatonin 3 MG TABS Take 1 tablet by mouth at bedtime.  . mirtazapine (REMERON) 30 MG tablet Take 1 tablet (30 mg total) by mouth at bedtime.  . Multiple Vitamins-Minerals (ICAPS PO) Take 1 tablet by mouth daily.  Vladimir Faster Glycol-Propyl Glycol (SYSTANE) 0.4-0.3 % SOLN Apply 1 drop to eye 4 (four) times daily.  . polyethylene glycol (MIRALAX / GLYCOLAX) packet Take 17 g by mouth daily as needed for mild constipation.  . sertraline (ZOLOFT) 100 MG tablet Take 200 mg by mouth every morning.   . tamsulosin (FLOMAX) 0.4 MG CAPS capsule TAKE 1 CAPSULE (0.4 MG TOTAL) BY MOUTH DAILY.  Marland Kitchen  topiramate (TOPAMAX) 25 MG tablet TAKE 1 TABLET BY MOUTH EVERY MORNING AND 2 TABLETS BY MOUTH EVERY EVENING  . zinc oxide 20 % ointment Apply 1 application topically as needed.   . [DISCONTINUED] acetaminophen (TYLENOL) 325 MG tablet Take 2 tablets (650 mg total) by mouth every 6 (six) hours as needed for mild pain (or Fever >/= 101).   No facility-administered encounter medications on file as of 08/29/2017.     Review of Systems  Constitutional: Negative for appetite change, chills, fatigue and fever.  HENT: Negative for congestion, ear discharge, ear pain, postnasal drip, rhinorrhea, sore throat and trouble swallowing.   Eyes: Positive for visual disturbance.  Respiratory: Negative for cough, shortness of breath and wheezing.   Cardiovascular: Negative for chest pain and palpitations.  Gastrointestinal: Negative for abdominal pain, blood in stool, constipation, diarrhea, nausea and vomiting.  Genitourinary: Negative for dysuria and flank pain.  Musculoskeletal: Positive for arthralgias, back pain and gait problem.       Uses walker for ambulation, no fall reported  Skin: Negative for rash.  Neurological: Negative for dizziness, weakness and headaches.  Psychiatric/Behavioral: Positive for dysphoric mood. Negative for behavioral problems and confusion. The patient is nervous/anxious.     Immunization History  Administered Date(s) Administered  . DTaP 11/05/2005  . Hepatitis B 1932/11/27  . Influenza Split 11/12/2011, 11/29/2013  . Influenza Whole 10/30/2007, 11/12/2008, 11/26/2009  . Influenza, High Dose Seasonal PF 12/31/2015, 11/24/2016  . Influenza,inj,quad, With Preservative 10/16/2016  . PPD Test 06/23/2011  . Pneumococcal Conjugate-13 01/14/2016  . Pneumococcal Polysaccharide-23 02/16/1999  . Tdap 01/13/2017  . Varicella 11/09/2005  . Zoster 08/30/2006   Pertinent  Health Maintenance Due  Topic Date Due  . INFLUENZA VACCINE  09/15/2017  . PNA vac Low Risk Adult   Completed   Fall Risk  10/27/2016 06/09/2016 02/03/2016 10/28/2015 01/16/2015  Falls in the past year? _0   Number falls in past yr: 2 or more 2 or more 2 or more 2 or more 2 or more  Comment - - 2 falls in last 2 months - -  Injury with Fall? Yes No Yes No (No Data)  Comment L hip - scraps and bruises - head  Risk Factor  Category  - - - - -  Risk for fall due to : - - - Impaired balance/gait -  Follow up - - - - -   Functional Status Survey:    Vitals:   08/29/17 1031  BP: 129/63  Pulse: 60  Resp: 14  Temp: 98.1 F (36.7 C)  TempSrc: Oral  SpO2: 95%  Weight: 153 lb (69.4 kg)  Height: 6' (1.829 m)   Body mass index is 20.75 kg/m.   Wt Readings from Last 3 Encounters:  08/29/17 153 lb (69.4 kg)  08/22/17 153 lb (69.4 kg)  08/08/17 150 lb 3.2 oz (68.1 kg)   Physical Exam  Constitutional: He is oriented to person, place, and time.  Elderly male in no acute distress  HENT:  Head: Normocephalic and atraumatic.  Right Ear: External ear normal.  Left Ear: External ear normal.  Mouth/Throat: Oropharynx is clear and moist.  Eyes: Conjunctivae and EOM are normal. Right eye exhibits no discharge. Left eye exhibits no discharge.  Corrective glasses  Neck: Normal range of motion. Neck supple.  Cardiovascular: Normal rate and regular rhythm.  Pulmonary/Chest: Effort normal and breath sounds normal. No respiratory distress. He has no wheezes. He has no rales.  Abdominal: Soft. Bowel sounds are normal. There is no tenderness. There is no rebound and no guarding.  Musculoskeletal: He exhibits no edema.  Able to move all 4 extremities, unsteady gait, uses walker, stooped posture  Lymphadenopathy:    He has no cervical adenopathy.  Neurological: He is alert and oriented to person, place, and time. He exhibits normal muscle tone.  Skin: Skin is warm and dry. No rash noted. He is not diaphoretic. No erythema.  Non blanchable redness to coccyx with small opening present    Psychiatric: He has a normal mood and affect.    Labs reviewed: Recent Labs    01/08/17 1356 01/10/17 1528 01/11/17 0532 01/24/17 04/14/17  NA 140 140 141 140 142  142  K 4.1 4.8 4.1 4.4 4.4  4.4  CL 111 112* 111  --   --   CO2 23 21* 25  --   --   GLUCOSE 95 87 92  --   --   BUN 35* 30* 28* 28* 34*  CREATININE 1.26* 1.13 1.07 1.2 1.3  1.26  CALCIUM 8.7* 8.8* 8.3*  --  8.8   Recent Labs    01/10/17 1528 01/11/17 0532 01/24/17 04/14/17  AST 18 16 12* 15  15  ALT 17 15* _0 ALKPHOS 38 38 114 40  40  BILITOT 0.7 0.7  --  0.4  PROT 6.2* 5.3*  --  5.4  ALBUMIN 3.2* 2.8*  --  3.3   Recent Labs    01/08/17 1356 01/10/17 1528 01/11/17 0532 01/24/17 04/14/17  WBC 6.6 6.9 6.3 6.3 5.7  5.7  NEUTROABS 4.6 5.3  --   --   --   HGB 12.4* 12.4* 11.9* 13.4* 14.0  14.0  HCT 38.6* 39.3 38.1* 42 42  42.1  MCV 96.5 97.3 96.2  --   --   PLT 141* 152 143* 239 134*   Lab Results  Component Value Date   TSH 1.87 06/16/2017   Lab Results  Component Value Date   HGBA1C 5.3 07/31/2012   Lab Results  Component Value Date   CHOL 125 05/16/2017   CHOL 125 05/16/2017   HDL 34 (A) 05/16/2017   LDLCALC 74 05/16/2017   LDLCALC 74 05/16/2017  LDLDIRECT 132.5 09/08/2012   TRIG 88 05/16/2017   TRIG 88 05/16/2017   CHOLHDL 2.7 11/15/2016    Significant Diagnostic Results in last 30 days:  No results found.  Assessment/Plan  1. Decubitus ulcer of coccyx, stage 2 Pressure ulcer prophylaxis, gel cushion for wheelchair, clean area and apply hydrogel and foam dressing, change q3 days and prn. Add decubivite.   2. Essential hypertension Continue amlodipine, reviewed BP reading, check bmp. Continue aspirin  3. Acquired hypothyroidism Lab Results  Component Value Date   TSH 1.87 06/16/2017   Stable, continue levothyroxine  4. CKD (chronic kidney disease) stage 3, GFR 30-59 ml/min (HCC) BMP Latest Ref Rng & Units 04/14/2017 04/14/2017 01/24/2017  Glucose 65 - 99  mg/dL - - -  BUN 4 - 21 34(A) - 28(A)  Creatinine 0.6 - 1.3 1.3 1.26 1.2  BUN/Creat Ratio 6 - 22 (calc) - - -  Sodium 137 - 147 142 142 140  Potassium 3.4 - 5.3 4.4 4.4 4.4  Chloride 101 - 111 mmol/L - - -  CO2 22 - 32 mmol/L - - -  Calcium - 8.8 - -   Check renal function, maintain hydration  5. HYPERLIPIDEMIA Continue atorvastatin Lipid Panel     Component Value Date/Time   CHOL 125 05/16/2017   CHOL 125 05/16/2017   TRIG 88 05/16/2017   TRIG 88 05/16/2017   HDL 34 (A) 05/16/2017   CHOLHDL 2.7 11/15/2016 0800   VLDL 16 02/02/2016 0830   LDLCALC 74 05/16/2017   LDLCALC 74 05/16/2017   LDLDIRECT 132.5 09/08/2012 1114    6. Chronic constipation Continue linzess. Change miralax to daily from as needed.     Family/ staff Communication: reviewed care plan with patient and charge nurse.    Labs/tests ordered:  cmp with egfr 09/05/17   [name], MD Internal Medicine [facility] [address] [place], [place] [address] Cell Phone (Monday-Friday 8 am - 5 pm): [phone] On Call: [phone] and follow prompts after 5 pm and on weekends Office Phone: [phone] Office Fax: [phone]

## 2017-08-31 ENCOUNTER — Non-Acute Institutional Stay (SKILLED_NURSING_FACILITY): Payer: Medicare Other | Admitting: Family

## 2017-08-31 ENCOUNTER — Encounter: Payer: Self-pay | Admitting: Family

## 2017-08-31 DIAGNOSIS — R21 Rash and other nonspecific skin eruption: Secondary | ICD-10-CM | POA: Diagnosis not present

## 2017-08-31 NOTE — Progress Notes (Signed)
Location:  [facility] Room Number: 34 Place of Service:  SNF [phone]) Provider: Emine Lopata FNP-C  [name], MD  Patient Care Team: [name], MD as PCP - General (Internal Medicine) [name], MD as Consulting Physician (Urology) [name], MD as Consulting Physician (Neurology) [name], MD as Consulting Physician (Dermatology) [name], MD (Inactive) as Consulting Physician (Gastroenterology) [name], MD as Consulting Physician (Orthopedic Surgery) [name], MD as Consulting Physician (Cardiology) [name], MD as Consulting Physician (Ophthalmology) [name], MD as Referring Physician (Psychiatry) [name], MD as Consulting Physician (General Surgery) [name], MD as Consulting Physician (Orthopedic Surgery) Azariel Banik, [name], NP as Nurse Practitioner (Family Medicine)  Extended Emergency Contact Information Primary Emergency Contact: Digioia,[name] Address: [address]          [place], [place] [address] [place] Phone: [phone] Relation: Spouse  Code Status:  DNR Goals of care: Advanced Directive information Advanced Directives 08/31/2017  Does Patient Have a Medical Advance Directive? Yes  Type of Paramedic of [place];Out of facility DNR (pink MOST or yellow form);Living will  Does patient want to make changes to medical advance directive? -  Copy of [place] in Chart? Yes  Pre-existing out of facility DNR order (yellow form or pink MOST form) Yellow form placed in chart (order not valid for inpatient use);Pink MOST form placed in chart (order not valid for inpatient use)     Chief Complaint  Patient presents with  . Acute Visit    rash    HPI:  Pt is a 84 y.o. male seen today at [facility] for an acute visit for evaluation of rash.He is seen in her room today with  facility Nurse supervisor present at bedside.Nurse reported via SBAR that patient has a raised rash to bilateral rib cage though left side more than the right side.He denies any itching,pain or burning sensation to rash area.He states had similar rash in the past and used cream with relief.He denies any fever,cough or sore throat.No changes in lotion or detergent.  Past Medical History:  Diagnosis Date  . Acute urinary retention 01/28/2013  . AKI (acute kidney injury) ([place]) 01/26/2013  . Altered mental status 09/09/2013  . Anemia    B12 deficiency  . ANXIETY 06/07/2007   Qualifier: Diagnosis of  By: Talbert Cage CMA ([place]), June    . ARTHRITIS 06/07/2007   Qualifier: Diagnosis of  By: Talbert Cage CMA ([place]), June    . Balance problems 04/05/2011  . Benign essential tremor   . Benign fibroma of prostate 10/11/2011  . Bilateral inguinal hernia 10/09/2012  . Bilateral leg edema 09/13/2012  . Bladder retention 02/05/2013  . Bradycardia, sinus 08/02/2012  . CAROTID ARTERY DISEASE 03/31/2007   Qualifier: Diagnosis of  By: Linna Darner MD, Gwyndolyn Saxon    . Carotid stenosis    s/p L CEA  . Cataract, nuclear 03/21/2014  . 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 Cervical spinal stenosis   . Cervical stenosis of spine   . Critical lower limb ischemia   . Degeneration macular 11/02/2011  . Depression    Dr Albertine Patricia  . Difficulty in walking(719.7) 04/05/2011  . ESOPHAGITIS 08/06/2002   Qualifier: Diagnosis of  By: Talbert Cage CMA ([place]), June    . Essential and other specified forms of tremor 04/18/2013  . Fall at home 01/06/2017  left hip pain  . Falls   . Family history of colon cancer 07/24/2012  . Fecal impaction ([place]) 10/11/2012  . Femoral hernia, bilateral s/p lap repair 01/16/2013 01/16/2013  . Glaucoma, compensated 03/21/2014  . H/O cardiovascular stress test    a. 02/2003 -> no ischemia/infarct.  Marland Kitchen HAMMER TOE 04/23/2010   Qualifier: Diagnosis of  By: Linna Darner MD, Gwyndolyn Saxon    . Hematoma of leg   . Hip hematoma, right  09/09/2013  . History of shingles 2009  . HTN (hypertension) 08/22/2012  . Hx of echocardiogram    Echocardiogram 08/01/12: Mild LVH, EF 55-60%, normal wall motion.  . Hydrocele 10/11/2011  . Hyperlipidemia   . 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 Hypothyroidism   . Macular degeneration disease   . Macular edema   . Migraines    Dr Jannifer Franklin  . MORTON'S NEUROMA, LEFT 08/22/2007   Qualifier: Diagnosis of  By: Linna Darner MD, Gwyndolyn Saxon    . PAD (peripheral artery disease) ([place])   . Peripheral neuropathy   . Personal history of colonic polyps 01/05/2013   2014 2 mm adenomatous polyp   . PREMATURE ATRIAL CONTRACTIONS 03/14/2006   Qualifier: Diagnosis of  By: Linna Darner MD, Gwendolyn Lima 12/16/2011  . SPINAL STENOSIS 03/14/2006   Qualifier: Diagnosis of  By: Linna Darner MD, William   Cervical spine    . Testosterone deficiency    Dr Lawerance Bach, [facility]  . Unspecified vitamin D deficiency 08/06/2012  . Urge incontinence 10/09/2012  . Urinary tract infection, site not specified 02/13/2013  . Weakness 08/02/2012   Past Surgical History:  Procedure Laterality Date  . APPENDECTOMY  1941  . BREAST CYST EXCISION Left 01/16/2013   Procedure: MASS EXCISION AXILLA;  Surgeon: Adin Hector, MD;  Location: [facility];  Service: General;  Laterality: Left;  . CAROTID ENDARTERECTOMY  2005    L  . CATARACT EXTRACTION Right   . COLONOSCOPY  2014   Dr. Deatra Ina  . EYE SURGERY Right 02/2012   retina  . INGUINAL HERNIA REPAIR Bilateral 01/16/2013   Procedure: LAPAROSCOPIC BILATERAL INGUINAL DIRECT AND INDIRECT, FEMORAL, AND OBTURATOR HERNIAS;  Surgeon: Adin Hector, MD;  Location: [facility];  Service: General;  Laterality: Bilateral;  . INSERTION OF MESH N/A 01/16/2013   Procedure: INSERTION OF MESH;  Surgeon: Adin Hector, MD;  Location: [facility];  Service: General;  Laterality: N/A;  . LUMBAR LAMINECTOMY    . TONSILLECTOMY AND ADENOIDECTOMY    . TOTAL HIP ARTHROPLASTY  2005   L    Allergies  Allergen Reactions  .  Latex Rash  . Tape Rash    Paper tape is ok to use     Outpatient Encounter Medications as of 08/31/2017  Medication Sig  . acetaminophen (TYLENOL) 500 MG tablet Take 1,000 mg by mouth 2 (two) times daily.  Marland Kitchen amLODipine (NORVASC) 5 MG tablet Take 5 mg by mouth daily.  Marland Kitchen aspirin 81 MG tablet Take 81 mg by mouth every morning.   Marland Kitchen atorvastatin (LIPITOR) 20 MG tablet TAKE 1 TABLET BY MOUTH DAILY FOR CHOLESTEROL  . B Complex-C (B-COMPLEX WITH VITAMIN C) tablet Take 1 tablet by mouth daily.  . bimatoprost (LUMIGAN) 0.01 % SOLN Place 1 drop into both eyes at bedtime.  Marland Kitchen buPROPion (WELLBUTRIN XL) 300 MG 24 hr tablet Take 300 mg by mouth daily.  . Cholecalciferol (VITAMIN D3) 1000 units CAPS Take 2,000 Units by mouth daily.   . clonazePAM (KLONOPIN) 0.5 MG tablet Take  0.5 mg by mouth at bedtime as needed for anxiety.  . fluticasone (CUTIVATE) 0.05 % cream Apply 1 application topically 2 (two) times daily as needed (irritation).   Marland Kitchen ketoconazole (NIZORAL) 2 % cream Apply 1 application topically daily as needed for irritation.   Javier Docker Oil 500 MG CAPS Take 500 mg by mouth every morning.   Marland Kitchen levothyroxine (SYNTHROID, LEVOTHROID) 50 MCG tablet Take 50 mcg by mouth daily before breakfast. Take one tablet daily except on Sunday's take 1/2 tablet.  . linaclotide (LINZESS) 145 MCG CAPS capsule Take 1 capsule (145 mcg total) by mouth daily before breakfast.  . Melatonin 3 MG TABS Take 1 tablet by mouth at bedtime.  . mirtazapine (REMERON) 30 MG tablet Take 1 tablet (30 mg total) by mouth at bedtime.  . Multiple Vitamins-Minerals (DECUBI-VITE PO) Take 1 tablet by mouth daily.  . Multiple Vitamins-Minerals (ICAPS PO) Take 1 tablet by mouth daily.  Vladimir Faster Glycol-Propyl Glycol (SYSTANE) 0.4-0.3 % SOLN Apply 1 drop to eye 4 (four) times daily.  . polyethylene glycol (MIRALAX / GLYCOLAX) packet Take 17 g by mouth daily.   . sertraline (ZOLOFT) 100 MG tablet Take 200 mg by mouth every morning.   .  tamsulosin (FLOMAX) 0.4 MG CAPS capsule TAKE 1 CAPSULE (0.4 MG TOTAL) BY MOUTH DAILY.  Marland Kitchen topiramate (TOPAMAX) 25 MG tablet TAKE 1 TABLET BY MOUTH EVERY MORNING AND 2 TABLETS BY MOUTH EVERY EVENING  . zinc oxide 20 % ointment Apply 1 application topically as needed.    No facility-administered encounter medications on file as of 08/31/2017.     Review of Systems  Constitutional: Negative for appetite change, chills, fatigue and fever.  HENT: Negative for congestion, rhinorrhea, sinus pressure, sinus pain, sneezing and sore throat.   Eyes: Positive for visual disturbance. Negative for discharge, redness and itching.  Respiratory: Negative for cough, chest tightness, shortness of breath and wheezing.   Cardiovascular: Negative for chest pain, palpitations and leg swelling.  Gastrointestinal: Negative for abdominal distention, abdominal pain, nausea and vomiting.  Musculoskeletal: Positive for gait problem. Negative for arthralgias.  Skin: Positive for rash. Negative for color change and pallor.       Sacral ulcer dressing managed by facility Nurse   Neurological: Negative for dizziness, light-headedness and headaches.  Psychiatric/Behavioral: Negative for agitation, confusion and sleep disturbance. The patient is not nervous/anxious.     Immunization History  Administered Date(s) Administered  . DTaP 11/05/2005  . Hepatitis B [birth year]  . Influenza Split 11/12/2011, 11/29/2013  . Influenza Whole 10/30/2007, 11/12/2008, 11/26/2009  . Influenza, High Dose Seasonal PF 12/31/2015, 11/24/2016  . Influenza,inj,quad, With Preservative 10/16/2016  . PPD Test 06/23/2011  . Pneumococcal Conjugate-13 01/14/2016  . Pneumococcal Polysaccharide-23 02/16/1999  . Tdap 01/13/2017  . Varicella 11/09/2005  . Zoster 08/30/2006   Pertinent  Health Maintenance Due  Topic Date Due  . INFLUENZA VACCINE  09/15/2017  . PNA vac Low Risk Adult  Completed   Fall Risk  10/27/2016 06/09/2016 02/03/2016  10/28/2015 01/16/2015  Falls in the past year? Yes Yes Yes Yes Yes  Number falls in past yr: 2 or more 2 or more 2 or more 2 or more 2 or more  Comment - - 2 falls in last 2 months - -  Injury with Fall? Yes No Yes No (No Data)  Comment L hip - scraps and bruises - head  Risk Factor Category  - - - - -  Risk for fall due to : - - -  Impaired balance/gait -  Follow up - - - - -   Functional Status Survey:    Vitals:   08/31/17 1332  BP: 117/78  Pulse: (!) 59  Resp: 18  Temp: 97.7 F (36.5 C)  SpO2: 97%  Weight: 153 lb (69.4 kg)  Height: 6' (1.829 m)   Body mass index is 20.75 kg/m. Physical Exam  Constitutional: He is oriented to person, place, and time.  Tall Thin,built elderly in no acute distress   HENT:  Head: Normocephalic.  Mouth/Throat: Oropharynx is clear and moist. No oropharyngeal exudate.  Eyes: Conjunctivae are normal. Right eye exhibits no discharge. No scleral icterus.  Neck: Normal range of motion. No thyromegaly present.  Cardiovascular: Normal rate, regular rhythm, normal heart sounds and intact distal pulses. Exam reveals no gallop and no friction rub.  No murmur heard. Pulmonary/Chest: Effort normal and breath sounds normal. No respiratory distress. He has no wheezes. He has no rales.  Abdominal: Soft. Bowel sounds are normal. He exhibits no distension and no mass. There is no tenderness. There is no rebound and no guarding.  Lymphadenopathy:    He has no cervical adenopathy.  Neurological: He is oriented to person, place, and time.  Skin: Skin is warm and dry. No erythema. No pallor.  Bilateral rib cage multiple small raised rash,rough texture and blanchable.Surrounding skin tissue without any signs of infections.   Psychiatric: He has a normal mood and affect. His speech is normal and behavior is normal. Judgment and thought content normal.  Nursing note and vitals reviewed.    Labs reviewed: Recent Labs    01/08/17 1356 01/10/17 1528 01/11/17 0532  01/24/17 04/14/17  NA 140 140 141 140 142  142  K 4.1 4.8 4.1 4.4 4.4  4.4  CL 111 112* 111  --   --   CO2 23 21* 25  --   --   GLUCOSE 95 87 92  --   --   BUN 35* 30* 28* 28* 34*  CREATININE 1.26* 1.13 1.07 1.2 1.3  1.26  CALCIUM 8.7* 8.8* 8.3*  --  8.8   Recent Labs    01/10/17 1528 01/11/17 0532 01/24/17 04/14/17  AST 18 16 12* 15  15  ALT 17 15* 10 15  15   ALKPHOS 38 38 114 40  40  BILITOT 0.7 0.7  --  0.4  PROT 6.2* 5.3*  --  5.4  ALBUMIN 3.2* 2.8*  --  3.3   Recent Labs    01/08/17 1356 01/10/17 1528 01/11/17 0532 01/24/17 04/14/17  WBC 6.6 6.9 6.3 6.3 5.7  5.7  NEUTROABS 4.6 5.3  --   --   --   HGB 12.4* 12.4* 11.9* 13.4* 14.0  14.0  HCT 38.6* 39.3 38.1* 42 42  42.1  MCV 96.5 97.3 96.2  --   --   PLT 141* 152 143* 239 134*   Lab Results  Component Value Date   TSH 1.87 06/16/2017   Lab Results  Component Value Date   HGBA1C 5.3 07/31/2012   Lab Results  Component Value Date   CHOL 125 05/16/2017   CHOL 125 05/16/2017   HDL 34 (A) 05/16/2017   LDLCALC 74 05/16/2017   LDLCALC 74 05/16/2017   LDLDIRECT 132.5 09/08/2012   TRIG 88 05/16/2017   TRIG 88 05/16/2017   CHOLHDL 2.7 11/15/2016    Significant Diagnostic Results in last 30 days:  No results found.  Assessment/Plan  Rash and nonspecific skin eruption Afebrile.Bilateral rib cage  multiple small raised rash,rough texture and blanchable.Surrounding skin tissue without any signs of infections. Unclear etiology.Ketoconazole cream effective in the past.Restart Ketoconazole 2% cream apply to affected areas on the both rib cage twice daily x 14 days.continue to monitor. Family/ staff Communication: Reviewed plan of care with patient and facility Nurse supervisor  Labs/tests ordered: None   Sandrea Hughs, NP

## 2017-09-05 ENCOUNTER — Encounter: Payer: Self-pay | Admitting: *Deleted

## 2017-09-05 LAB — COMPLETE METABOLIC PANEL WITH GFR
ALBUMIN: 3.4
ALT: 14
AST: 15
Alkaline Phosphatase: 40
BILIRUBIN TOTAL: 0.4
Calcium: 8.5
Creat: 1.29
GLUCOSE: 78
Potassium: 4.4
SODIUM: 142
TOTAL PROTEIN: 5.5 g/dL
Total Bilirubin: 39

## 2017-09-06 ENCOUNTER — Encounter: Payer: Self-pay | Admitting: Family

## 2017-09-06 ENCOUNTER — Non-Acute Institutional Stay (SKILLED_NURSING_FACILITY): Payer: Medicare Other | Admitting: Family

## 2017-09-06 DIAGNOSIS — M25552 Pain in left hip: Secondary | ICD-10-CM

## 2017-09-06 DIAGNOSIS — M25572 Pain in left ankle and joints of left foot: Secondary | ICD-10-CM | POA: Diagnosis not present

## 2017-09-06 DIAGNOSIS — N4889 Other specified disorders of penis: Secondary | ICD-10-CM | POA: Diagnosis not present

## 2017-09-06 NOTE — Progress Notes (Signed)
Location:  [facility] Room Number: 34 Place of Service:  SNF [phone]) Provider: Ottis Sarnowski FNP-C  [name], MD  Patient Care Team: [name], MD as PCP - General (Internal Medicine) [name], MD as Consulting Physician (Urology) [name], MD as Consulting Physician (Neurology) [name], MD as Consulting Physician (Dermatology) [name], MD (Inactive) as Consulting Physician (Gastroenterology) [name], MD as Consulting Physician (Orthopedic Surgery) [name], MD as Consulting Physician (Cardiology) [name], MD as Consulting Physician (Ophthalmology) [name], MD as Referring Physician (Psychiatry) [name], MD as Consulting Physician (General Surgery) [name], MD as Consulting Physician (Orthopedic Surgery) Corneisha Alvi, [name], NP as Nurse Practitioner (Family Medicine)  Extended Emergency Contact Information Primary Emergency Contact: Shugart,[name] Address: [address]          [place], [place] [address] [place] Phone: [phone] Relation: Spouse  Code Status:  DNR Goals of care: Advanced Directive information Advanced Directives 09/06/2017  Does Patient Have a Medical Advance Directive? Yes  Type of Paramedic of [place];Out of facility DNR (pink MOST or yellow form);Living will  Does patient want to make changes to medical advance directive? -  Copy of [place] in Chart? Yes  Pre-existing out of facility DNR order (yellow form or pink MOST form) Yellow form placed in chart (order not valid for inpatient use);Pink MOST form placed in chart (order not valid for inpatient use)     Chief Complaint  Patient presents with  . Acute Visit    ankle pain; penile bleeding    HPI:  Pt is a 84 y.o. male seen today at [facility] for an acute visit for evaluation of left ankle pain and  bleeding form penile area.He is seen in his room today with Nurse at bedside.He states was looking for something in the room 0n 09/05/2017 when he twisted his left ankle.He complained of left ankle and hip pain on call provider was notified by facility Nurse.Portable X-ray of the left  hip and ankle was ordered.He denies any acute pain on the ankle during visit. X-ray results of the left ankle and hip showed no acute fracture or dislocation.X-ray results discussed with patient. Nurse also reported that patient had small-moderate amount of blood on the diaper from patient's penile area at night 09/05/2017.He has had no further bleeding since then.He states had used the urinal and might have scratched area.No bruise or scratch marks reported.He denies any fever,chills,or signs and symptoms of urinary tract infections.    Past Medical History:  Diagnosis Date  . Acute urinary retention 01/28/2013  . AKI (acute kidney injury) ([place]) 01/26/2013  . Altered mental status 09/09/2013  . Anemia    B12 deficiency  . ANXIETY 06/07/2007   Qualifier: Diagnosis of  By: Talbert Cage CMA ([place]), June    . ARTHRITIS 06/07/2007   Qualifier: Diagnosis of  By: Talbert Cage CMA ([place]), June    . Balance problems 04/05/2011  . Benign essential tremor   . Benign fibroma of prostate 10/11/2011  . Bilateral inguinal hernia 10/09/2012  . Bilateral leg edema 09/13/2012  . Bladder retention 02/05/2013  . Bradycardia, sinus 08/02/2012  . CAROTID ARTERY DISEASE 03/31/2007   Qualifier: Diagnosis of  By: Linna Darner MD, Gwyndolyn Saxon    . Carotid stenosis    s/p L CEA  . Cataract, nuclear 03/21/2014  . Cellophane  retinopathy 04/27/2011  . Cervical spinal stenosis   . Cervical stenosis of spine   . Critical lower limb ischemia   . Degeneration macular 11/02/2011  . Depression    Dr Albertine Patricia  . Difficulty in walking(719.7) 04/05/2011  . ESOPHAGITIS 08/06/2002   Qualifier: Diagnosis of  By: Talbert Cage CMA ([place]), June    . Essential and other specified  forms of tremor 04/18/2013  . Fall at home 01/06/2017   left hip pain  . Falls   . Family history of colon cancer 07/24/2012  . Fecal impaction ([place]) 10/11/2012  . Femoral hernia, bilateral s/p lap repair 01/16/2013 01/16/2013  . Glaucoma, compensated 03/21/2014  . H/O cardiovascular stress test    a. 02/2003 -> no ischemia/infarct.  Marland Kitchen HAMMER TOE 04/23/2010   Qualifier: Diagnosis of  By: Linna Darner MD, Gwyndolyn Saxon    . Hematoma of leg   . Hip hematoma, right 09/09/2013  . History of shingles 2009  . HTN (hypertension) 08/22/2012  . Hx of echocardiogram    Echocardiogram 08/01/12: Mild LVH, EF 55-60%, normal wall motion.  . Hydrocele 10/11/2011  . Hyperlipidemia   . 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 Hypothyroidism   . Macular degeneration disease   . Macular edema   . Migraines    Dr Jannifer Franklin  . MORTON'S NEUROMA, LEFT 08/22/2007   Qualifier: Diagnosis of  By: Linna Darner MD, Gwyndolyn Saxon    . PAD (peripheral artery disease) ([place])   . Peripheral neuropathy   . Personal history of colonic polyps 01/05/2013   2014 2 mm adenomatous polyp   . PREMATURE ATRIAL CONTRACTIONS 03/14/2006   Qualifier: Diagnosis of  By: Linna Darner MD, Gwendolyn Lima 12/16/2011  . SPINAL STENOSIS 03/14/2006   Qualifier: Diagnosis of  By: Linna Darner MD, William   Cervical spine    . Testosterone deficiency    Dr Lawerance Bach, [facility]  . Unspecified vitamin D deficiency 08/06/2012  . Urge incontinence 10/09/2012  . Urinary tract infection, site not specified 02/13/2013  . Weakness 08/02/2012   Past Surgical History:  Procedure Laterality Date  . APPENDECTOMY  1941  . BREAST CYST EXCISION Left 01/16/2013   Procedure: MASS EXCISION AXILLA;  Surgeon: Adin Hector, MD;  Location: [place];  Service: General;  Laterality: Left;  . CAROTID ENDARTERECTOMY  2005    L  . CATARACT EXTRACTION Right   . COLONOSCOPY  2014   Dr. Deatra Ina  . EYE SURGERY Right 02/2012   retina  . INGUINAL HERNIA REPAIR Bilateral 01/16/2013   Procedure: LAPAROSCOPIC  BILATERAL INGUINAL DIRECT AND INDIRECT, FEMORAL, AND OBTURATOR HERNIAS;  Surgeon: Adin Hector, MD;  Location: [place];  Service: General;  Laterality: Bilateral;  . INSERTION OF MESH N/A 01/16/2013   Procedure: INSERTION OF MESH;  Surgeon: Adin Hector, MD;  Location: [facility];  Service: General;  Laterality: N/A;  . LUMBAR LAMINECTOMY    . TONSILLECTOMY AND ADENOIDECTOMY    . TOTAL HIP ARTHROPLASTY  2005   L    Allergies  Allergen Reactions  . Latex Rash  . Tape Rash    Paper tape is ok to use     Outpatient Encounter Medications as of 09/06/2017  Medication Sig  . acetaminophen (TYLENOL) 500 MG tablet Take 1,000 mg by mouth 2 (two) times daily.  . Amino Acids-Protein Hydrolys (FEEDING SUPPLEMENT, PRO-STAT SUGAR FREE 64,) LIQD Take 30 mLs by mouth daily.  Marland Kitchen amLODipine (NORVASC) 5 MG tablet Take 5 mg by mouth daily.  Marland Kitchen aspirin 81  MG tablet Take 81 mg by mouth every morning.   Marland Kitchen atorvastatin (LIPITOR) 20 MG tablet TAKE 1 TABLET BY MOUTH DAILY FOR CHOLESTEROL  . B Complex-C (B-COMPLEX WITH VITAMIN C) tablet Take 1 tablet by mouth daily.  . bimatoprost (LUMIGAN) 0.01 % SOLN Place 1 drop into both eyes at bedtime.  Marland Kitchen buPROPion (WELLBUTRIN XL) 300 MG 24 hr tablet Take 300 mg by mouth daily.  . Cholecalciferol (VITAMIN D3) 1000 units CAPS Take 2,000 Units by mouth daily.   . clonazePAM (KLONOPIN) 0.5 MG tablet Take 0.5 mg by mouth at bedtime as needed for anxiety.  . fluticasone (CUTIVATE) 0.05 % cream Apply 1 application topically 2 (two) times daily as needed (irritation).   Marland Kitchen ketoconazole (NIZORAL) 2 % cream Apply 1 application topically daily as needed for irritation.   Javier Docker Oil 500 MG CAPS Take 500 mg by mouth every morning.   Marland Kitchen levothyroxine (SYNTHROID, LEVOTHROID) 50 MCG tablet Take 50 mcg by mouth daily before breakfast. Take one tablet daily except on Sunday's take 1/2 tablet.  . linaclotide (LINZESS) 145 MCG CAPS capsule Take 1 capsule (145 mcg total) by mouth daily before  breakfast.  . Melatonin 3 MG TABS Take 1 tablet by mouth at bedtime.  . mirtazapine (REMERON) 30 MG tablet Take 1 tablet (30 mg total) by mouth at bedtime.  . Multiple Vitamins-Minerals (DECUBI-VITE PO) Take 1 tablet by mouth daily.  . Multiple Vitamins-Minerals (ICAPS PO) Take 1 tablet by mouth daily.  Vladimir Faster Glycol-Propyl Glycol (SYSTANE) 0.4-0.3 % SOLN Apply 1 drop to eye 4 (four) times daily.  . polyethylene glycol (MIRALAX / GLYCOLAX) packet Take 17 g by mouth daily.   . sertraline (ZOLOFT) 100 MG tablet Take 200 mg by mouth every morning.   . tamsulosin (FLOMAX) 0.4 MG CAPS capsule TAKE 1 CAPSULE (0.4 MG TOTAL) BY MOUTH DAILY.  Marland Kitchen topiramate (TOPAMAX) 25 MG tablet TAKE 1 TABLET BY MOUTH EVERY MORNING AND 2 TABLETS BY MOUTH EVERY EVENING  . zinc oxide 20 % ointment Apply 1 application topically as needed.    No facility-administered encounter medications on file as of 09/06/2017.     Review of Systems  Constitutional: Negative for appetite change, chills, fatigue and fever.  Respiratory: Negative for cough, chest tightness, shortness of breath and wheezing.   Cardiovascular: Positive for leg swelling. Negative for chest pain and palpitations.  Gastrointestinal: Negative for abdominal distention, abdominal pain, constipation, diarrhea, nausea and vomiting.  Genitourinary: Negative for discharge, dysuria, flank pain, frequency, hematuria, penile pain, penile swelling and urgency.  Musculoskeletal: Positive for arthralgias and gait problem.  Skin: Negative for color change, pallor and rash.  Neurological: Negative for dizziness, light-headedness and headaches.  Psychiatric/Behavioral: Negative for agitation, confusion and sleep disturbance. The patient is not nervous/anxious.     Immunization History  Administered Date(s) Administered  . DTaP 11/05/2005  . Hepatitis B 07/03/1932  . Influenza Split 11/12/2011, 11/29/2013  . Influenza Whole 10/30/2007, 11/12/2008, 11/26/2009  .  Influenza, High Dose Seasonal PF 12/31/2015, 11/24/2016  . Influenza,inj,quad, With Preservative 10/16/2016  . PPD Test 06/23/2011  . Pneumococcal Conjugate-13 01/14/2016  . Pneumococcal Polysaccharide-23 02/16/1999  . Tdap 01/13/2017  . Varicella 11/09/2005  . Zoster 08/30/2006   Pertinent  Health Maintenance Due  Topic Date Due  . INFLUENZA VACCINE  09/15/2017  . PNA vac Low Risk Adult  Completed   Fall Risk  10/27/2016 06/09/2016 02/03/2016 10/28/2015 01/16/2015  Falls in the past year? Yes Yes Yes Yes Yes  Number  falls in past yr: 2 or more 2 or more 2 or more 2 or more 2 or more  Comment - - 2 falls in last 2 months - -  Injury with Fall? Yes No Yes No (No Data)  Comment L hip - scraps and bruises - head  Risk Factor Category  - - - - -  Risk for fall due to : - - - Impaired balance/gait -  Follow up - - - - -   Functional Status Survey:    Vitals:   09/06/17 1138  BP: 120/62  Pulse: (!) 58  Resp: 18  Temp: (!) 97.3 F (36.3 C)  SpO2: 97%  Weight: 153 lb (69.4 kg)  Height: 6' (1.829 m)   Body mass index is 20.75 kg/m. Physical Exam  Constitutional: He is oriented to person, place, and time.  Tall thin built elderly in no acute distress   HENT:  Head: Normocephalic.  Mouth/Throat: Oropharynx is clear and moist. No oropharyngeal exudate.  Cardiovascular: Normal rate, regular rhythm, normal heart sounds and intact distal pulses. Exam reveals no gallop and no friction rub.  No murmur heard. Pulmonary/Chest: Effort normal and breath sounds normal. No respiratory distress. He has no wheezes. He has no rales. He exhibits no tenderness.  Abdominal: Soft. Bowel sounds are normal. He exhibits no distension and no mass. There is no tenderness. There is no rebound and no guarding.  Genitourinary: No penile erythema or penile tenderness. No discharge found.  Musculoskeletal: He exhibits no edema or tenderness.  Moves x 4 extremities unsteady gait ambulates with FWW and uses  wheelchair for long distance.  Neurological: He is oriented to person, place, and time. Gait abnormal.  Skin: Skin is warm and dry. No rash noted. No erythema. No pallor.  Psychiatric: He has a normal mood and affect. His speech is normal and behavior is normal.  Nursing note and vitals reviewed.   Labs reviewed: Recent Labs    01/08/17 1356 01/10/17 1528 01/11/17 0532 01/24/17 04/14/17 09/05/17  NA 140 140 141 140 142  142 142  K 4.1 4.8 4.1 4.4 4.4  4.4 4.4  CL 111 112* 111  --   --   --   CO2 23 21* 25  --   --   --   GLUCOSE 95 87 92  --   --   --   BUN 35* 30* 28* 28* 34*  --   CREATININE 1.26* 1.13 1.07 1.2 1.3  1.26 1.29  CALCIUM 8.7* 8.8* 8.3*  --  8.8 8.5   Recent Labs    01/11/17 0532  04/14/17 09/05/17  AST 16   < > 15  15 15   ALT 15*   < > 15  15 14   ALKPHOS 38   < > 40  40 40  BILITOT 0.7  --  0.4 39.0  0.4  PROT 5.3*  --  5.4 5.5  ALBUMIN 2.8*  --  3.3 3.4   < > = values in this interval not displayed.   Recent Labs    01/08/17 1356 01/10/17 1528 01/11/17 0532 01/24/17 04/14/17  WBC 6.6 6.9 6.3 6.3 5.7  5.7  NEUTROABS 4.6 5.3  --   --   --   HGB 12.4* 12.4* 11.9* 13.4* 14.0  14.0  HCT 38.6* 39.3 38.1* 42 42  42.1  MCV 96.5 97.3 96.2  --   --   PLT 141* 152 143* 239 134*   Lab Results  Component Value  Date   TSH 1.87 06/16/2017   Lab Results  Component Value Date   HGBA1C 5.3 07/31/2012   Lab Results  Component Value Date   CHOL 125 05/16/2017   CHOL 125 05/16/2017   HDL 34 (A) 05/16/2017   LDLCALC 74 05/16/2017   LDLCALC 74 05/16/2017   LDLDIRECT 132.5 09/08/2012   TRIG 88 05/16/2017   TRIG 88 05/16/2017   CHOLHDL 2.7 11/15/2016    Significant Diagnostic Results in last 30 days:  X-ray Left Ankle 09/05/2017 resulted :  Impression: No acute osseous,articular or soft tissues abnormality  X-ray Left Hip 09/05/2017 resulted: Impression: No acute fracture or dislocation.Intact appearing total hip arthroplasty  hardware. Assessment/Plan 1. Acute left ankle pain No pain during visit.X-ray results showed no acute fracture or dislocation.continue on Extra strength Tylenol for pain and monitor.  2. Left hip pain S/p total hip arthroplasty 2018.chronic pain.X-ray results showed no acute fracture or dislocation of intact appearing of total hip arthroplasty hardware.continue to monitor.Fall and safety precautions.   3. Penile bleeding Blood reported on the diaper and penis area on 09/05/2017 but none since then.No bleeding,sweeling or tenderness noted during visit.No signs of urinary tract infections.continue to monitor for now.  Family/ staff Communication: Reviewed plan of care with patient and facility Nurse.   Labs/tests ordered: None   Tavish Gettis C Joshus Rogan, NP

## 2017-09-13 ENCOUNTER — Non-Acute Institutional Stay (SKILLED_NURSING_FACILITY): Payer: Medicare Other | Admitting: Family

## 2017-09-13 ENCOUNTER — Encounter: Payer: Self-pay | Admitting: Family

## 2017-09-13 DIAGNOSIS — R040 Epistaxis: Secondary | ICD-10-CM | POA: Diagnosis not present

## 2017-09-13 LAB — CBC AND DIFFERENTIAL
HEMATOCRIT: 48 (ref 41–53)
Hemoglobin: 15.5 (ref 13.5–17.5)
NEUTROS ABS: 3235
PLATELETS: 144 — AB (ref 150–399)
WBC: 5

## 2017-09-13 NOTE — Progress Notes (Addendum)
Location:  [facility] Room Number: 34 Place of Service:  SNF [phone]) Provider: Dinah [name] FNP-C  [name], MD  Patient Care Team: [name], MD as PCP - General (Internal Medicine) [name], MD as Consulting Physician (Urology) [name], MD as Consulting Physician (Neurology) [name], MD as Consulting Physician (Dermatology) [name], MD (Inactive) as Consulting Physician (Gastroenterology) [name], MD as Consulting Physician (Orthopedic Surgery) [name], MD as Consulting Physician (Cardiology) [name], MD as Consulting Physician (Ophthalmology) [name], MD as Referring Physician (Psychiatry) [name], MD as Consulting Physician (General Surgery) [name], MD as Consulting Physician (Orthopedic Surgery) [name], [name], NP as Nurse Practitioner (Family Medicine)  Extended Emergency Contact Information Primary Emergency Contact: Churilla,[name] Address: [address]          [place],  [address] [place] Phone: [phone] Relation: Spouse  Code Status:  DNR Goals of care: Advanced Directive information Advanced Directives 09/13/2017  Does Patient Have a Medical Advance Directive? Yes  Type of Paramedic of [place];Out of facility DNR (pink MOST or yellow form);Living will  Does patient want to make changes to medical advance directive? -  Copy of [place] in Chart? Yes  Pre-existing out of facility DNR order (yellow form or pink MOST form) Yellow form placed in chart (order not valid for inpatient use);Pink MOST form placed in chart (order not valid for inpatient use)     Chief Complaint  Patient presents with  . Acute Visit    nose bleed    HPI:  Pt is a 84 y.o. male seen today at [facility] for an acute visit for evaluation of nose bleed.He is seen in his room today  with wife and daughter present at bedside.He states had bleeding from the left nares yesterday afternoon but none today.Patient's daughter states patient has had bleeding in the past on and off that required cauterizing.He denies any fever,chills,headache,dizziness or trauma to nose.He states feeling fine today.CBC/diff ordered but Nurse states lab tech reported specimen clotted.Patient declined last evening for blood work to be redrawn.Blood work has been drawn this morning results pending.     Past Medical History:  Diagnosis Date  . Acute urinary retention 01/28/2013  . AKI (acute kidney injury) ([place]) 01/26/2013  . Altered mental status 09/09/2013  . Anemia    B12 deficiency  . ANXIETY 06/07/2007   Qualifier: Diagnosis of  By: Talbert Cage CMA ([place]), June    . ARTHRITIS 06/07/2007   Qualifier: Diagnosis of  By: Talbert Cage CMA ([place]), June    . Balance problems 04/05/2011  . Benign essential tremor   . Benign fibroma of prostate 10/11/2011  . Bilateral inguinal hernia 10/09/2012  . Bilateral leg edema 09/13/2012  . Bladder retention 02/05/2013  . Bradycardia, sinus 08/02/2012  . CAROTID ARTERY DISEASE 03/31/2007   Qualifier: Diagnosis of  By: Linna Darner MD, Gwyndolyn Saxon    . Carotid stenosis    s/p L CEA  . Cataract, nuclear 03/21/2014  . 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 Cervical spinal stenosis   . Cervical stenosis of spine   . Critical lower limb ischemia   . Degeneration macular 11/02/2011  . Depression    Dr Albertine Patricia  . Difficulty in walking(719.7) 04/05/2011  . ESOPHAGITIS 08/06/2002   Qualifier: Diagnosis of  By: Talbert Cage CMA ([place]), June    .  Essential and other specified forms of tremor 04/18/2013  . Fall at home 01/06/2017   left hip pain  . Falls   . Family history of colon cancer 07/24/2012  . Fecal impaction ([place]) 10/11/2012  . Femoral hernia, bilateral s/p lap repair 01/16/2013 01/16/2013  . Glaucoma, compensated 03/21/2014  . H/O cardiovascular stress test    a. 02/2003 -> no  ischemia/infarct.  Marland Kitchen HAMMER TOE 04/23/2010   Qualifier: Diagnosis of  By: Linna Darner MD, Gwyndolyn Saxon    . Hematoma of leg   . Hip hematoma, right 09/09/2013  . History of shingles 2009  . HTN (hypertension) 08/22/2012  . Hx of echocardiogram    Echocardiogram 08/01/12: Mild LVH, EF 55-60%, normal wall motion.  . Hydrocele 10/11/2011  . Hyperlipidemia   . 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 Hypothyroidism   . Macular degeneration disease   . Macular edema   . Migraines    Dr Jannifer Franklin  . MORTON'S NEUROMA, LEFT 08/22/2007   Qualifier: Diagnosis of  By: Linna Darner MD, Gwyndolyn Saxon    . PAD (peripheral artery disease) ([place])   . Peripheral neuropathy   . Personal history of colonic polyps 01/05/2013   2014 2 mm adenomatous polyp   . PREMATURE ATRIAL CONTRACTIONS 03/14/2006   Qualifier: Diagnosis of  By: Linna Darner MD, Gwendolyn Lima 12/16/2011  . SPINAL STENOSIS 03/14/2006   Qualifier: Diagnosis of  By: Linna Darner MD, William   Cervical spine    . Testosterone deficiency    Dr Lawerance Bach, [facility]  . Unspecified vitamin D deficiency 08/06/2012  . Urge incontinence 10/09/2012  . Urinary tract infection, site not specified 02/13/2013  . Weakness 08/02/2012   Past Surgical History:  Procedure Laterality Date  . APPENDECTOMY  1941  . BREAST CYST EXCISION Left 01/16/2013   Procedure: MASS EXCISION AXILLA;  Surgeon: Adin Hector, MD;  Location: [facility];  Service: General;  Laterality: Left;  . CAROTID ENDARTERECTOMY  2005    L  . CATARACT EXTRACTION Right   . COLONOSCOPY  2014   Dr. Deatra Ina  . EYE SURGERY Right 02/2012   retina  . INGUINAL HERNIA REPAIR Bilateral 01/16/2013   Procedure: LAPAROSCOPIC BILATERAL INGUINAL DIRECT AND INDIRECT, FEMORAL, AND OBTURATOR HERNIAS;  Surgeon: Adin Hector, MD;  Location: [facility];  Service: General;  Laterality: Bilateral;  . INSERTION OF MESH N/A 01/16/2013   Procedure: INSERTION OF MESH;  Surgeon: Adin Hector, MD;  Location: [facility];  Service: General;  Laterality: N/A;    . LUMBAR LAMINECTOMY    . TONSILLECTOMY AND ADENOIDECTOMY    . TOTAL HIP ARTHROPLASTY  2005   L    Allergies  Allergen Reactions  . Latex Rash  . Tape Rash    Paper tape is ok to use     Outpatient Encounter Medications as of 09/13/2017  Medication Sig  . acetaminophen (TYLENOL) 500 MG tablet Take 1,000 mg by mouth 2 (two) times daily.  . Amino Acids-Protein Hydrolys (FEEDING SUPPLEMENT, PRO-STAT SUGAR FREE 64,) LIQD Take 30 mLs by mouth daily.  Marland Kitchen amLODipine (NORVASC) 5 MG tablet Take 5 mg by mouth daily.  Marland Kitchen aspirin 81 MG tablet Take 81 mg by mouth every morning.   Marland Kitchen atorvastatin (LIPITOR) 20 MG tablet TAKE 1 TABLET BY MOUTH DAILY FOR CHOLESTEROL  . B Complex-C (B-COMPLEX WITH VITAMIN C) tablet Take 1 tablet by mouth daily.  . bimatoprost (LUMIGAN) 0.01 % SOLN Place 1 drop into both eyes at bedtime.  Marland Kitchen buPROPion [facility]  XL) 300 MG 24 hr tablet Take 300 mg by mouth daily.  . Cholecalciferol (VITAMIN D3) 1000 units CAPS Take 2,000 Units by mouth daily.   . clonazePAM (KLONOPIN) 0.5 MG tablet Take 0.5 mg by mouth at bedtime as needed for anxiety.  . fluticasone (CUTIVATE) 0.05 % cream Apply 1 application topically 2 (two) times daily as needed (irritation).   Marland Kitchen ketoconazole (NIZORAL) 2 % cream Apply 1 application topically daily as needed for irritation.   Javier Docker Oil 500 MG CAPS Take 500 mg by mouth every morning.   Marland Kitchen levothyroxine (SYNTHROID, LEVOTHROID) 50 MCG tablet Take 50 mcg by mouth daily before breakfast. Take one tablet daily except on Sunday's take 1/2 tablet.  . linaclotide (LINZESS) 145 MCG CAPS capsule Take 1 capsule (145 mcg total) by mouth daily before breakfast.  . Melatonin 3 MG TABS Take 1 tablet by mouth at bedtime.  . mirtazapine (REMERON) 30 MG tablet Take 1 tablet (30 mg total) by mouth at bedtime.  . Multiple Vitamins-Minerals (DECUBI-VITE PO) Take 1 tablet by mouth daily.  . Multiple Vitamins-Minerals (ICAPS PO) Take 1 tablet by mouth daily.  Vladimir Faster  Glycol-Propyl Glycol (SYSTANE) 0.4-0.3 % SOLN Apply 1 drop to eye 4 (four) times daily.  . polyethylene glycol (MIRALAX / GLYCOLAX) packet Take 17 g by mouth daily.   . sertraline (ZOLOFT) 100 MG tablet Take 200 mg by mouth every morning.   . tamsulosin (FLOMAX) 0.4 MG CAPS capsule TAKE 1 CAPSULE (0.4 MG TOTAL) BY MOUTH DAILY.  Marland Kitchen topiramate (TOPAMAX) 25 MG tablet TAKE 1 TABLET BY MOUTH EVERY MORNING AND 2 TABLETS BY MOUTH EVERY EVENING  . zinc oxide 20 % ointment Apply 1 application topically as needed.    No facility-administered encounter medications on file as of 09/13/2017.     Review of Systems  Constitutional: Negative for chills, fatigue and fever.  HENT: Negative for congestion, postnasal drip, rhinorrhea, sinus pressure, sinus pain, sneezing and sore throat.        Nose bleed per HPI   Eyes: Positive for visual disturbance. Negative for discharge, redness and itching.  Respiratory: Negative for cough, chest tightness, shortness of breath and wheezing.   Gastrointestinal: Negative for abdominal distention, abdominal pain, constipation, diarrhea, nausea and vomiting.  Skin: Negative for color change and pallor.  Neurological: Negative for dizziness, light-headedness and headaches.  Hematological: Does not bruise/bleed easily.  Psychiatric/Behavioral: Negative for agitation, confusion and sleep disturbance. The patient is not nervous/anxious.     Immunization History  Administered Date(s) Administered  . DTaP 11/05/2005  . Hepatitis B 05-05-1932  . Influenza Split 11/12/2011, 11/29/2013  . Influenza Whole 10/30/2007, 11/12/2008, 11/26/2009  . Influenza, High Dose Seasonal PF 12/31/2015, 11/24/2016  . Influenza,inj,quad, With Preservative 10/16/2016  . PPD Test 06/23/2011  . Pneumococcal Conjugate-13 01/14/2016  . Pneumococcal Polysaccharide-23 02/16/1999  . Tdap 01/13/2017  . Varicella 11/09/2005  . Zoster 08/30/2006   Pertinent  Health Maintenance Due  Topic Date Due  .  INFLUENZA VACCINE  09/15/2017  . PNA vac Low Risk Adult  Completed   Fall Risk  10/27/2016 06/09/2016 02/03/2016 10/28/2015 01/16/2015  Falls in the past year? Yes Yes Yes Yes Yes  Number falls in past yr: 2 or more 2 or more 2 or more 2 or more 2 or more  Comment - - 2 falls in last 2 months - -  Injury with Fall? Yes No Yes No (No Data)  Comment L hip - scraps and bruises - head  Risk Factor Category  - - - - -  Risk for fall due to : - - - Impaired balance/gait -  Follow up - - - - -   Functional Status Survey:    Vitals:   09/13/17 1056  BP: (!) 170/92  Pulse: 61  Resp: 15  Temp: 98.8 F (37.1 C)  SpO2: 94%  Weight: 153 lb (69.4 kg)  Height: 6' (1.829 m)   Body mass index is 20.75 kg/m. Physical Exam  Constitutional:  Tall thin built,elderly in no acute distress   HENT:  Head: Normocephalic.  Right Ear: External ear normal.  Left Ear: External ear normal.  Nose: Nose normal.  Mouth/Throat: Oropharynx is clear and moist. No oropharyngeal exudate.  Eyes: Conjunctivae and EOM are normal. Right eye exhibits no discharge. Left eye exhibits no discharge. No scleral icterus.  Right eye round and reactive to light.   Neck: Normal range of motion. No JVD present. No thyromegaly present.  Cardiovascular: Normal rate, regular rhythm, normal heart sounds and intact distal pulses. Exam reveals no gallop and no friction rub.  No murmur heard. Pulmonary/Chest: Effort normal and breath sounds normal. No respiratory distress. He has no wheezes. He has no rales.  Abdominal: Soft. Bowel sounds are normal. He exhibits no distension and no mass. There is no tenderness. There is no rebound and no guarding.  Lymphadenopathy:    He has no cervical adenopathy.  Skin: Skin is warm and dry. Capillary refill takes 2 to 3 seconds. No rash noted. No erythema. No pallor.  Psychiatric: He has a normal mood and affect. His speech is normal and behavior is normal.  Nursing note and vitals  reviewed.   Labs reviewed: Recent Labs    01/08/17 1356 01/10/17 1528 01/11/17 0532 01/24/17 04/14/17 09/05/17  NA 140 140 141 140 142  142 142  K 4.1 4.8 4.1 4.4 4.4  4.4 4.4  CL 111 112* 111  --   --   --   CO2 23 21* 25  --   --   --   GLUCOSE 95 87 92  --   --   --   BUN 35* 30* 28* 28* 34*  --   CREATININE 1.26* 1.13 1.07 1.2 1.3  1.26 1.29  CALCIUM 8.7* 8.8* 8.3*  --  8.8 8.5   Recent Labs    01/11/17 0532  04/14/17 09/05/17  AST 16   < > 15  15 15   ALT 15*   < > 15  15 14   ALKPHOS 38   < > 40  40 40  BILITOT 0.7  --  0.4 39.0  0.4  PROT 5.3*  --  5.4 5.5  ALBUMIN 2.8*  --  3.3 3.4   < > = values in this interval not displayed.   Recent Labs    01/08/17 1356 01/10/17 1528 01/11/17 0532 01/24/17 04/14/17  WBC 6.6 6.9 6.3 6.3 5.7  5.7  NEUTROABS 4.6 5.3  --   --   --   HGB 12.4* 12.4* 11.9* 13.4* 14.0  14.0  HCT 38.6* 39.3 38.1* 42 42  42.1  MCV 96.5 97.3 96.2  --   --   PLT 141* 152 143* 239 134*   Lab Results  Component Value Date   TSH 1.87 06/16/2017   Lab Results  Component Value Date   HGBA1C 5.3 07/31/2012   Lab Results  Component Value Date   CHOL 125 05/16/2017   CHOL 125 05/16/2017  HDL 34 (A) 05/16/2017   LDLCALC 74 05/16/2017   LDLCALC 74 05/16/2017   LDLDIRECT 132.5 09/08/2012   TRIG 88 05/16/2017   TRIG 88 05/16/2017   CHOLHDL 2.7 11/15/2016    Significant Diagnostic Results in last 30 days:  No results found.  Assessment/Plan   Left-sided epistaxis Afebrile.No bleeding noted during visit with negative exam.has had nasal bleeding in the past requiring cauterization per patient's daughter present during visit.CBC/diff results pending.continue to monitor.    Family/ staff Communication: Reviewed plan of care with patient and facility Nurse supervisor  Labs/tests ordered: CBC/diff   Addendum: CBC down 09/13/2017 resulted.Hgb 15.5 and Plts 144 stable.continue to monitor.    Sandrea Hughs, NP

## 2017-09-28 ENCOUNTER — Encounter: Payer: Self-pay | Admitting: Family

## 2017-09-28 ENCOUNTER — Non-Acute Institutional Stay (SKILLED_NURSING_FACILITY): Payer: Medicare Other | Admitting: Family

## 2017-09-28 ENCOUNTER — Encounter: Payer: Self-pay | Admitting: Internal Medicine

## 2017-09-28 DIAGNOSIS — E039 Hypothyroidism, unspecified: Secondary | ICD-10-CM | POA: Diagnosis not present

## 2017-09-28 DIAGNOSIS — R21 Rash and other nonspecific skin eruption: Secondary | ICD-10-CM

## 2017-09-28 DIAGNOSIS — M25552 Pain in left hip: Secondary | ICD-10-CM

## 2017-09-28 DIAGNOSIS — E782 Mixed hyperlipidemia: Secondary | ICD-10-CM | POA: Diagnosis not present

## 2017-09-28 DIAGNOSIS — H6123 Impacted cerumen, bilateral: Secondary | ICD-10-CM

## 2017-09-28 NOTE — Progress Notes (Signed)
Location:  [facility] Room Number: 34 Place of Service:  SNF [phone]) Provider: Dinah [name] FNP-C   [name], MD  Patient Care Team: [name], MD as PCP - General (Internal Medicine) [name], MD as Consulting Physician (Urology) [name], MD as Consulting Physician (Neurology) [name], MD as Consulting Physician (Dermatology) [name], MD (Inactive) as Consulting Physician (Gastroenterology) [name], MD as Consulting Physician (Orthopedic Surgery) [name], MD as Consulting Physician (Cardiology) [name], MD as Consulting Physician (Ophthalmology) [name], MD as Referring Physician (Psychiatry) [name], MD as Consulting Physician (General Surgery) [name], MD as Consulting Physician (Orthopedic Surgery) [name], [name], NP as Nurse Practitioner (Family Medicine)  Extended Emergency Contact Information Primary Emergency Contact: Dombrosky,[name] Address: [address]          [place], [place] [address] [place] Phone: [phone] Relation: Spouse  Code Status: DNR  Goals of care: Advanced Directive information Advanced Directives 09/28/2017  Does Patient Have a Medical Advance Directive? Yes  Type of Paramedic of [place];Out of facility DNR (pink MOST or yellow form);Living will  Does patient want to make changes to medical advance directive? -  Copy of [place] in Chart? Yes  Pre-existing out of facility DNR order (yellow form or pink MOST form) Yellow form placed in chart (order not valid for inpatient use);Pink MOST form placed in chart (order not valid for inpatient use)     Chief Complaint  Patient presents with  . Medical Management of Chronic Issues    HPI:  Pt is a 84 y.o. male seen today [facility] for medical management of chronic diseases.He has a medical history  of HTN,CAD,PAD,Hyperlipidemia,hypothyroidism,CKD stage 3,BPH,AMD,depression,Anxiety among other conditions.He is seen in his room today.Facility Nurse states patient complained of left leg pain on call provided was notified.Extra strength tylenol 1000 mg tablet three times daily and Norco 5/325 mg tablet every 6 hours as needed x 24 hours was ordered.patient states  pain much better today.      Past Medical History:  Diagnosis Date  . Acute urinary retention 01/28/2013  . AKI (acute kidney injury) ([place]) 01/26/2013  . Altered mental status 09/09/2013  . Anemia    B12 deficiency  . ANXIETY 06/07/2007   Qualifier: Diagnosis of  By: Talbert Cage CMA ([place]), June    . ARTHRITIS 06/07/2007   Qualifier: Diagnosis of  By: Talbert Cage CMA ([place]), June    . Balance problems 04/05/2011  . Benign essential tremor   . Benign fibroma of prostate 10/11/2011  . Bilateral inguinal hernia 10/09/2012  . Bilateral leg edema 09/13/2012  . Bladder retention 02/05/2013  . Bradycardia, sinus 08/02/2012  . CAROTID ARTERY DISEASE 03/31/2007   Qualifier: Diagnosis of  By: Linna Darner MD, Gwyndolyn Saxon    . Carotid stenosis    s/p L CEA  . Cataract, nuclear 03/21/2014  . 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 Cervical spinal stenosis   . Cervical stenosis of spine   . Critical lower limb ischemia   . Degeneration macular 11/02/2011  . Depression    Dr Albertine Patricia  . Difficulty in walking(719.7) 04/05/2011  . ESOPHAGITIS 08/06/2002   Qualifier: Diagnosis of  By: Talbert Cage CMA ([place]), June    . Essential and other specified forms of tremor 04/18/2013  . Fall at home 01/06/2017   left hip pain  . Falls   .  Family history of colon cancer 07/24/2012  . Fecal impaction ([facility]) 10/11/2012  . Femoral hernia, bilateral s/p lap repair 01/16/2013 01/16/2013  . Glaucoma, compensated 03/21/2014  . H/O cardiovascular stress test    a. 02/2003 -> no ischemia/infarct.  Marland Kitchen HAMMER TOE 04/23/2010   Qualifier: Diagnosis of  By: Linna Darner MD, Gwyndolyn Saxon    . Hematoma of leg     . Hip hematoma, right 09/09/2013  . History of shingles 2009  . HTN (hypertension) 08/22/2012  . Hx of echocardiogram    Echocardiogram 08/01/12: Mild LVH, EF 55-60%, normal wall motion.  . Hydrocele 10/11/2011  . Hyperlipidemia   . 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 Hypothyroidism   . Macular degeneration disease   . Macular edema   . Migraines    Dr Jannifer Franklin  . MORTON'S NEUROMA, LEFT 08/22/2007   Qualifier: Diagnosis of  By: Linna Darner MD, Gwyndolyn Saxon    . PAD (peripheral artery disease) ([facility])   . Peripheral neuropathy   . Personal history of colonic polyps 01/05/2013   2014 2 mm adenomatous polyp   . PREMATURE ATRIAL CONTRACTIONS 03/14/2006   Qualifier: Diagnosis of  By: Linna Darner MD, Gwendolyn Lima 12/16/2011  . SPINAL STENOSIS 03/14/2006   Qualifier: Diagnosis of  By: Linna Darner MD, William   Cervical spine    . Testosterone deficiency    Dr Lawerance Bach, [facility]  . Unspecified vitamin D deficiency 08/06/2012  . Urge incontinence 10/09/2012  . Urinary tract infection, site not specified 02/13/2013  . Weakness 08/02/2012   Past Surgical History:  Procedure Laterality Date  . APPENDECTOMY  1941  . BREAST CYST EXCISION Left 01/16/2013   Procedure: MASS EXCISION AXILLA;  Surgeon: Adin Hector, MD;  Location: [facility];  Service: General;  Laterality: Left;  . CAROTID ENDARTERECTOMY  2005    L  . CATARACT EXTRACTION Right   . COLONOSCOPY  2014   Dr. Deatra Ina  . EYE SURGERY Right 02/2012   retina  . INGUINAL HERNIA REPAIR Bilateral 01/16/2013   Procedure: LAPAROSCOPIC BILATERAL INGUINAL DIRECT AND INDIRECT, FEMORAL, AND OBTURATOR HERNIAS;  Surgeon: Adin Hector, MD;  Location: [facility];  Service: General;  Laterality: Bilateral;  . INSERTION OF MESH N/A 01/16/2013   Procedure: INSERTION OF MESH;  Surgeon: Adin Hector, MD;  Location: [facility];  Service: General;  Laterality: N/A;  . LUMBAR LAMINECTOMY    . TONSILLECTOMY AND ADENOIDECTOMY    . TOTAL HIP ARTHROPLASTY  2005   L    Allergies   Allergen Reactions  . Latex Rash  . Tape Rash    Paper tape is ok to use     Allergies as of 09/28/2017      Reactions   Latex Rash   Tape Rash   Paper tape is ok to use      Medication List        Accurate as of 09/28/17  2:19 PM. Always use your most recent med list.          acetaminophen 500 MG tablet Commonly known as:  TYLENOL Take 1,000 mg by mouth 2 (two) times daily.   amLODipine 5 MG tablet Commonly known as:  NORVASC Take 5 mg by mouth daily.   aspirin 81 MG tablet Take 81 mg by mouth every morning.   atorvastatin 20 MG tablet Commonly known as:  LIPITOR TAKE 1 TABLET BY MOUTH DAILY FOR CHOLESTEROL   B-complex with vitamin C tablet Take 1 tablet by mouth daily.  bimatoprost 0.01 % Soln Commonly known as:  LUMIGAN Place 1 drop into both eyes at bedtime.   buPROPion 300 MG 24 hr tablet Commonly known as:  WELLBUTRIN XL Take 300 mg by mouth daily.   clonazePAM 0.5 MG tablet Commonly known as:  KLONOPIN Take 0.5 mg by mouth at bedtime as needed for anxiety.   feeding supplement (PRO-STAT SUGAR FREE 64) Liqd Take 30 mLs by mouth daily.   fluticasone 0.05 % cream Commonly known as:  CUTIVATE Apply 1 application topically 2 (two) times daily as needed (irritation).   ICAPS PO Take 1 tablet by mouth daily.   DECUBI-VITE PO Take 1 tablet by mouth daily.   ketoconazole 2 % cream Commonly known as:  NIZORAL Apply 1 application topically daily as needed for irritation.   Krill Oil 500 MG Caps Take 500 mg by mouth every morning.   levothyroxine 50 MCG tablet Commonly known as:  SYNTHROID, LEVOTHROID Take 50 mcg by mouth daily before breakfast. Take one tablet daily except on Sunday's take 1/2 tablet.   linaclotide 145 MCG Caps capsule Commonly known as:  LINZESS Take 1 capsule (145 mcg total) by mouth daily before breakfast.   Melatonin 3 MG Tabs Take 1 tablet by mouth at bedtime.   mirtazapine 30 MG tablet Commonly known as:   REMERON Take 1 tablet (30 mg total) by mouth at bedtime.   polyethylene glycol packet Commonly known as:  MIRALAX / GLYCOLAX Take 17 g by mouth daily.   sertraline 100 MG tablet Commonly known as:  ZOLOFT Take 200 mg by mouth every morning.   SYSTANE 0.4-0.3 % Soln Generic drug:  Polyethyl Glycol-Propyl Glycol Apply 1 drop to eye 4 (four) times daily.   tamsulosin 0.4 MG Caps capsule Commonly known as:  FLOMAX TAKE 1 CAPSULE (0.4 MG TOTAL) BY MOUTH DAILY.   topiramate 25 MG tablet Commonly known as:  TOPAMAX TAKE 1 TABLET BY MOUTH EVERY MORNING AND 2 TABLETS BY MOUTH EVERY EVENING   Vitamin D3 1000 units Caps Take 2,000 Units by mouth daily.   zinc oxide 20 % ointment Apply 1 application topically as needed.       Review of Systems  Constitutional: Negative for appetite change, chills, fatigue, fever and unexpected weight change.  HENT: Negative for congestion, ear discharge, ear pain, rhinorrhea, sinus pressure, sinus pain, sneezing, sore throat and trouble swallowing.        Has had problems with hearing   Eyes: Positive for visual disturbance. Negative for discharge, redness and itching.       Wears eye glasses   Respiratory: Negative for cough, chest tightness, shortness of breath and wheezing.   Cardiovascular: Negative for chest pain, palpitations and leg swelling.  Gastrointestinal: Negative for abdominal distention, abdominal pain, constipation, diarrhea, nausea and vomiting.  Endocrine: Negative for cold intolerance, heat intolerance, polydipsia, polyphagia and polyuria.  Genitourinary: Negative for dysuria, flank pain, frequency and urgency.  Musculoskeletal: Positive for arthralgias and gait problem.  Skin: Negative for color change, pallor, rash and wound.  Neurological: Negative for dizziness, light-headedness and headaches.  Hematological: Does not bruise/bleed easily.  Psychiatric/Behavioral: Negative for agitation and sleep disturbance. The patient is  not nervous/anxious.     Immunization History  Administered Date(s) Administered  . DTaP 11/05/2005  . Hepatitis B 11-13-1932  . Influenza Split 11/12/2011, 11/29/2013  . Influenza Whole 10/30/2007, 11/12/2008, 11/26/2009  . Influenza, High Dose Seasonal PF 12/31/2015, 11/24/2016  . Influenza,inj,quad, With Preservative 10/16/2016  . PPD Test 06/23/2011  .  Pneumococcal Conjugate-13 01/14/2016  . Pneumococcal Polysaccharide-23 02/16/1999  . Tdap 01/13/2017  . Varicella 11/09/2005  . Zoster 08/30/2006   Pertinent  Health Maintenance Due  Topic Date Due  . INFLUENZA VACCINE  09/15/2017  . PNA vac Low Risk Adult  Completed   Fall Risk  10/27/2016 06/09/2016 02/03/2016 10/28/2015 01/16/2015  Falls in the past year? Yes Yes Yes Yes Yes  Number falls in past yr: 2 or more 2 or more 2 or more 2 or more 2 or more  Comment - - 2 falls in last 2 months - -  Injury with Fall? Yes No Yes No (No Data)  Comment L hip - scraps and bruises - head  Risk Factor Category  - - - - -  Risk for fall due to : - - - Impaired balance/gait -  Follow up - - - - -    Vitals:   09/28/17 1001  BP: 134/72  Pulse: (!) 57  Resp: 16  Temp: (!) 97.5 F (36.4 C)  SpO2: 98%  Weight: 148 lb 1.6 oz (67.2 kg)  Height: 6' (1.829 m)   Body mass index is 20.09 kg/m. Physical Exam  Constitutional: He is oriented to person, place, and time.  Tall thin built elderly in no acute distress   HENT:  Head: Normocephalic.  Mouth/Throat: Oropharynx is clear and moist. No oropharyngeal exudate.  Bilateral ear cerumen impaction  Eyes: Pupils are equal, round, and reactive to light. Conjunctivae and EOM are normal. Right eye exhibits no discharge. Left eye exhibits no discharge. No scleral icterus.  Neck: Normal range of motion. No JVD present. No thyromegaly present.  Cardiovascular: Normal rate, regular rhythm, normal heart sounds and intact distal pulses. Exam reveals no gallop and no friction rub.  No murmur  heard. Pulmonary/Chest: Effort normal and breath sounds normal. No respiratory distress. He has no wheezes. He has no rales.  Abdominal: Soft. Bowel sounds are normal. He exhibits no distension and no mass. There is no tenderness. There is no rebound and no guarding.  Musculoskeletal: He exhibits no edema or tenderness.  Unsteady gait ambulates short distance with Front wheel walker and self propel on wheelchair.   Lymphadenopathy:    He has no cervical adenopathy.  Neurological: He is oriented to person, place, and time. Gait abnormal.  Skin: Skin is warm and dry. Rash noted. No erythema. No pallor.  Red rash with rough texture to bilateral rib cage.  Psychiatric: He has a normal mood and affect. His speech is normal and behavior is normal. Thought content normal.   Labs reviewed: Recent Labs    01/08/17 1356 01/10/17 1528 01/11/17 0532 01/24/17 04/14/17 09/05/17  NA 140 140 141 140 142  142 142  K 4.1 4.8 4.1 4.4 4.4  4.4 4.4  CL 111 112* 111  --   --   --   CO2 23 21* 25  --   --   --   GLUCOSE 95 87 92  --   --   --   BUN 35* 30* 28* 28* 34*  --   CREATININE 1.26* 1.13 1.07 1.2 1.3  1.26 1.29  CALCIUM 8.7* 8.8* 8.3*  --  8.8 8.5   Recent Labs    01/11/17 0532  04/14/17 09/05/17  AST 16   < > 15  15 15   ALT 15*   < > 15  15 14   ALKPHOS 38   < > 40  40 40  BILITOT 0.7  --  0.4 39.0  0.4  PROT 5.3*  --  5.4 5.5  ALBUMIN 2.8*  --  3.3 3.4   < > = values in this interval not displayed.   Recent Labs    01/08/17 1356 01/10/17 1528 01/11/17 0532 01/24/17 04/14/17 09/13/17  WBC 6.6 6.9 6.3 6.3 5.7  5.7 5.0  NEUTROABS 4.6 5.3  --   --   --  3,235  HGB 12.4* 12.4* 11.9* 13.4* 14.0  14.0 15.5  HCT 38.6* 39.3 38.1* 42 42  42.1 48  MCV 96.5 97.3 96.2  --   --   --   PLT 141* 152 143* 239 134* 144*   Lab Results  Component Value Date   TSH 1.87 06/16/2017   Lab Results  Component Value Date   HGBA1C 5.3 07/31/2012   Lab Results  Component Value Date   CHOL  125 05/16/2017   CHOL 125 05/16/2017   HDL 34 (A) 05/16/2017   LDLCALC 74 05/16/2017   LDLCALC 74 05/16/2017   LDLDIRECT 132.5 09/08/2012   TRIG 88 05/16/2017   TRIG 88 05/16/2017   CHOLHDL 2.7 11/15/2016    Significant Diagnostic Results in last 30 days:  No results found.  Assessment/Plan  1. Bilateral impacted cerumen Has had decreased hearing to both ears.TM not visualized to both ears due to cerumen impaction.Start on Debrox 6.5 % otic solution instil 5 drops twice daily x 4 days then facility Nurse to lavage with warm water.will refer to ENT if still has decreased hearing.  2. Rash and nonspecific skin eruption Afebrile.Treated one month ago for same rash which responded well to Ketoconazole per Nurse.apply Ketoconazole 2 % cream twice daily x 14 days to affected areas on both rib cage.will send to dermatology for further evaluation if no improvement.continue to monitor.   3. Acquired hypothyroidism Lab Results  Component Value Date   TSH 1.87 06/16/2017  Continue on levothyroxine 50 mcg tablet one by mouth daily.check TSH level 10/03/2017.   4. HYPERLIPIDEMIA Continue on Lipitor 20 mg tablet daily.check lipid panel 10/03/2017.    5. Left hip pain Had pain previous day but much better today.On call provider adjusted Tylenol 1000 mg tablet to three times daily.continue to monitor.   Family/ staff Communication: Reviewed plan of care with patient and facility Nurse  Labs/tests ordered: TSH level and lipid panel 10/03/2017.     Sandrea Hughs, NP

## 2017-10-03 LAB — LIPID PANEL
Cholesterol: 143
HDL Cholesterol: 41
LDL: 84
TRIGLYCERIDES: 86

## 2017-10-03 LAB — TSH: TSH: 2.43

## 2017-10-04 ENCOUNTER — Encounter: Payer: Self-pay | Admitting: *Deleted

## 2017-10-18 ENCOUNTER — Encounter: Payer: Self-pay | Admitting: Family

## 2017-10-18 ENCOUNTER — Encounter: Payer: Medicare Other | Admitting: Family

## 2017-10-18 ENCOUNTER — Non-Acute Institutional Stay (SKILLED_NURSING_FACILITY): Payer: Medicare Other | Admitting: Family

## 2017-10-18 DIAGNOSIS — R0781 Pleurodynia: Secondary | ICD-10-CM

## 2017-10-18 NOTE — Progress Notes (Signed)
Location:  [facility] Room Number: 34 Place of Service:  SNF [phone]) Provider: Lashika Erker FNP-C  [name], MD  Patient Care Team: [name], MD as PCP - General (Internal Medicine) [name], MD as Consulting Physician (Urology) [name], MD as Consulting Physician (Neurology) [name], MD as Consulting Physician (Dermatology) [name], MD (Inactive) as Consulting Physician (Gastroenterology) [name], MD as Consulting Physician (Orthopedic Surgery) [name], MD as Consulting Physician (Cardiology) [name], MD as Consulting Physician (Ophthalmology) [name], MD as Referring Physician (Psychiatry) [name], MD as Consulting Physician (General Surgery) [name], MD as Consulting Physician (Orthopedic Surgery) Amyriah Buras, [name], NP as Nurse Practitioner (Family Medicine)  Extended Emergency Contact Information Primary Emergency Contact: Rousseau,[name] Address: [address]          [place], [place] [address] [place] Phone: [phone] Relation: Spouse  Code Status:  DNR Goals of care: Advanced Directive information Advanced Directives 10/18/2017  Does Patient Have a Medical Advance Directive? Yes  Type of Paramedic of [place];Out of facility DNR (pink MOST or yellow form);Living will  Does patient want to make changes to medical advance directive? -  Copy of [place] in Chart? Yes  Pre-existing out of facility DNR order (yellow form or pink MOST form) Yellow form placed in chart (order not valid for inpatient use);Pink MOST form placed in chart (order not valid for inpatient use)     Chief Complaint  Patient presents with  . Acute Visit    clavicle pain    HPI:  Pt is a 84 y.o. male seen today at [facility] for an acute visit for evaluation of clavicle pain.He is seen in his room  today per facility Nurse request.Nurse reports patient called the provider's main office requesting to be seen today by provider.he complains of left clavicle pain.He states has had pain for several weeks but pain has been worst for the past one week. He denies any trauma to clavicle area.Provider asked patient to touch where it hurts and patient pointed to left rib and sternum area. He denies any difficulties raising /moving left hand.No recent fall episodes reported.    Past Medical History:  Diagnosis Date  . Acute urinary retention 01/28/2013  . AKI (acute kidney injury) ([place]) 01/26/2013  . Altered mental status 09/09/2013  . Anemia    B12 deficiency  . ANXIETY 06/07/2007   Qualifier: Diagnosis of  By: Talbert Cage CMA ([place]), June    . ARTHRITIS 06/07/2007   Qualifier: Diagnosis of  By: Talbert Cage CMA ([place]), June    . Balance problems 04/05/2011  . Benign essential tremor   . Benign fibroma of prostate 10/11/2011  . Bilateral inguinal hernia 10/09/2012  . Bilateral leg edema 09/13/2012  . Bladder retention 02/05/2013  . Bradycardia, sinus 08/02/2012  . CAROTID ARTERY DISEASE 03/31/2007   Qualifier: Diagnosis of  By: Linna Darner MD, Gwyndolyn Saxon    . Carotid stenosis    s/p L CEA  . Cataract, nuclear 03/21/2014  . 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 Cervical spinal stenosis   . Cervical stenosis of spine   . Critical lower limb ischemia   . Degeneration macular 11/02/2011  . Depression    Dr Albertine Patricia  . Difficulty in walking(719.7) 04/05/2011  . ESOPHAGITIS 08/06/2002   Qualifier: Diagnosis of  By: Talbert Cage CMA ([place]), June    .  Essential and other specified forms of tremor 04/18/2013  . Fall at home 01/06/2017   left hip pain  . Falls   . Family history of colon cancer 07/24/2012  . Fecal impaction ([place]) 10/11/2012  . Femoral hernia, bilateral s/p lap repair 01/16/2013 01/16/2013  . Glaucoma, compensated 03/21/2014  . H/O cardiovascular stress test    a. 02/2003 -> no ischemia/infarct.  Marland Kitchen HAMMER TOE  04/23/2010   Qualifier: Diagnosis of  By: Linna Darner MD, Gwyndolyn Saxon    . Hematoma of leg   . Hip hematoma, right 09/09/2013  . History of shingles 2009  . HTN (hypertension) 08/22/2012  . Hx of echocardiogram    Echocardiogram 08/01/12: Mild LVH, EF 55-60%, normal wall motion.  . Hydrocele 10/11/2011  . Hyperlipidemia   . 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 Hypothyroidism   . Macular degeneration disease   . Macular edema   . Migraines    Dr Jannifer Franklin  . MORTON'S NEUROMA, LEFT 08/22/2007   Qualifier: Diagnosis of  By: Linna Darner MD, Gwyndolyn Saxon    . PAD (peripheral artery disease) ([place])   . Peripheral neuropathy   . Personal history of colonic polyps 01/05/2013   2014 2 mm adenomatous polyp   . PREMATURE ATRIAL CONTRACTIONS 03/14/2006   Qualifier: Diagnosis of  By: Linna Darner MD, Gwendolyn Lima 12/16/2011  . SPINAL STENOSIS 03/14/2006   Qualifier: Diagnosis of  By: Linna Darner MD, William   Cervical spine    . Testosterone deficiency    Dr Lawerance Bach, [facility]  . Unspecified vitamin D deficiency 08/06/2012  . Urge incontinence 10/09/2012  . Urinary tract infection, site not specified 02/13/2013  . Weakness 08/02/2012   Past Surgical History:  Procedure Laterality Date  . APPENDECTOMY  1941  . BREAST CYST EXCISION Left 01/16/2013   Procedure: MASS EXCISION AXILLA;  Surgeon: Adin Hector, MD;  Location: [facility];  Service: General;  Laterality: Left;  . CAROTID ENDARTERECTOMY  2005    L  . CATARACT EXTRACTION Right   . COLONOSCOPY  2014   Dr. Deatra Ina  . EYE SURGERY Right 02/2012   retina  . INGUINAL HERNIA REPAIR Bilateral 01/16/2013   Procedure: LAPAROSCOPIC BILATERAL INGUINAL DIRECT AND INDIRECT, FEMORAL, AND OBTURATOR HERNIAS;  Surgeon: Adin Hector, MD;  Location: [facility];  Service: General;  Laterality: Bilateral;  . INSERTION OF MESH N/A 01/16/2013   Procedure: INSERTION OF MESH;  Surgeon: Adin Hector, MD;  Location: [facility];  Service: General;  Laterality: N/A;  . LUMBAR LAMINECTOMY    .  TONSILLECTOMY AND ADENOIDECTOMY    . TOTAL HIP ARTHROPLASTY  2005   L    Allergies  Allergen Reactions  . Latex Rash  . Tape Rash    Paper tape is ok to use     Outpatient Encounter Medications as of 10/18/2017  Medication Sig  . acetaminophen (TYLENOL) 500 MG tablet Take 1,000 mg by mouth 2 (two) times daily.  . Amino Acids-Protein Hydrolys (FEEDING SUPPLEMENT, PRO-STAT SUGAR FREE 64,) LIQD Take 30 mLs by mouth daily.  Marland Kitchen amLODipine (NORVASC) 5 MG tablet Take 5 mg by mouth daily.  Marland Kitchen aspirin 81 MG tablet Take 81 mg by mouth every morning.   Marland Kitchen atorvastatin (LIPITOR) 10 MG tablet Take 10 mg by mouth daily.  . B Complex-C (B-COMPLEX WITH VITAMIN C) tablet Take 1 tablet by mouth daily.  . bimatoprost (LUMIGAN) 0.01 % SOLN Place 1 drop into both eyes at bedtime.  Marland Kitchen buPROPion (WELLBUTRIN XL) 300 MG  24 hr tablet Take 300 mg by mouth daily.  . Cholecalciferol (VITAMIN D3) 1000 units CAPS Take 2,000 Units by mouth daily.   . fluticasone (CUTIVATE) 0.05 % cream Apply 1 application topically 2 (two) times daily as needed (irritation).   Marland Kitchen ketoconazole (NIZORAL) 2 % cream Apply 1 application topically daily as needed for irritation.   Javier Docker Oil 500 MG CAPS Take 500 mg by mouth every morning.   Marland Kitchen levothyroxine (SYNTHROID, LEVOTHROID) 50 MCG tablet Take 50 mcg by mouth daily before breakfast. Take one tablet daily except on Sunday's take 1/2 tablet.  . linaclotide (LINZESS) 145 MCG CAPS capsule Take 1 capsule (145 mcg total) by mouth daily before breakfast.  . Melatonin 3 MG TABS Take 1 tablet by mouth at bedtime.  . mirtazapine (REMERON) 30 MG tablet Take 1 tablet (30 mg total) by mouth at bedtime.  . Multiple Vitamins-Minerals (ICAPS PO) Take 1 tablet by mouth daily.  Vladimir Faster Glycol-Propyl Glycol (SYSTANE) 0.4-0.3 % SOLN Apply 1 drop to eye 4 (four) times daily.  . polyethylene glycol (MIRALAX / GLYCOLAX) packet Take 17 g by mouth daily.   . sertraline (ZOLOFT) 100 MG tablet Take 200 mg by  mouth every morning.   . tamsulosin (FLOMAX) 0.4 MG CAPS capsule TAKE 1 CAPSULE (0.4 MG TOTAL) BY MOUTH DAILY.  Marland Kitchen topiramate (TOPAMAX) 25 MG tablet TAKE 1 TABLET BY MOUTH EVERY MORNING AND 2 TABLETS BY MOUTH EVERY EVENING  . zinc oxide 20 % ointment Apply 1 application topically as needed.   . [DISCONTINUED] atorvastatin (LIPITOR) 20 MG tablet TAKE 1 TABLET BY MOUTH DAILY FOR CHOLESTEROL (Patient not taking: Reported on 10/18/2017)  . [DISCONTINUED] clonazePAM (KLONOPIN) 0.5 MG tablet Take 0.5 mg by mouth at bedtime as needed for anxiety.  . [DISCONTINUED] Multiple Vitamins-Minerals (DECUBI-VITE PO) Take 1 tablet by mouth daily.   No facility-administered encounter medications on file as of 10/18/2017.     Review of Systems  Constitutional: Negative for chills and fever.  Respiratory: Negative for cough, chest tightness, shortness of breath and wheezing.   Gastrointestinal: Negative for abdominal distention, abdominal pain, constipation, diarrhea, nausea and vomiting.  Genitourinary: Negative for dysuria, flank pain, frequency and urgency.  Musculoskeletal: Positive for arthralgias and gait problem.  Skin: Negative for color change, pallor and rash.  Neurological: Negative for dizziness, light-headedness and headaches.  Psychiatric/Behavioral: Negative for agitation and sleep disturbance. The patient is not nervous/anxious.     Immunization History  Administered Date(s) Administered  . DTaP 11/05/2005  . Hepatitis B Jun 23, 1932  . Influenza Split 11/12/2011, 11/29/2013  . Influenza Whole 10/30/2007, 11/12/2008, 11/26/2009  . Influenza, High Dose Seasonal PF 12/31/2015, 11/24/2016  . Influenza,inj,quad, With Preservative 10/16/2016  . PPD Test 06/23/2011  . Pneumococcal Conjugate-13 01/14/2016  . Pneumococcal Polysaccharide-23 02/16/1999  . Tdap 01/13/2017  . Varicella 11/09/2005  . Zoster 08/30/2006   Pertinent  Health Maintenance Due  Topic Date Due  . INFLUENZA VACCINE  09/15/2017   . PNA vac Low Risk Adult  Completed   Fall Risk  10/27/2016 06/09/2016 02/03/2016 10/28/2015 01/16/2015  Falls in the past year? Yes Yes Yes Yes Yes  Number falls in past yr: 2 or more 2 or more 2 or more 2 or more 2 or more  Comment - - 2 falls in last 2 months - -  Injury with Fall? Yes No Yes No (No Data)  Comment L hip - scraps and bruises - head  Risk Factor Category  - - - - -  Risk for fall due to : - - - Impaired balance/gait -  Follow up - - - - -   Functional Status Survey:    Vitals:   10/18/17 1350  BP: 104/68  Pulse: 72  Resp: 20  Temp: (!) 96.6 F (35.9 C)  SpO2: 97%  Weight: 150 lb 9.6 oz (68.3 kg)  Height: 6' (1.829 m)   Body mass index is 20.43 kg/m. Physical Exam  Constitutional: He is oriented to person, place, and time.  Tall thin built elderly in no acute distress   HENT:  Head: Normocephalic.  Mouth/Throat: Oropharynx is clear and moist. No oropharyngeal exudate.  Eyes: Pupils are equal, round, and reactive to light. Conjunctivae are normal. Right eye exhibits no discharge. Left eye exhibits no discharge. No scleral icterus.  Neck: Normal range of motion. No JVD present. No thyromegaly present.  Cardiovascular: Normal rate, regular rhythm, normal heart sounds and intact distal pulses. Exam reveals no gallop and no friction rub.  No murmur heard. Pulmonary/Chest: Effort normal and breath sounds normal. No respiratory distress. He has no wheezes. He has no rales. He exhibits no tenderness.  Abdominal: Soft. Bowel sounds are normal. He exhibits no distension and no mass. There is no rebound and no guarding.  Musculoskeletal: Normal range of motion. He exhibits no edema or tenderness.  Left upper sternum and rib joint area hard bulging area none tender to touch and without any redness.   Lymphadenopathy:    He has no cervical adenopathy.  Neurological: He is oriented to person, place, and time.  Skin: Skin is warm and dry. No rash noted. No erythema. No  pallor.  Psychiatric: He has a normal mood and affect. His speech is normal and behavior is normal. Judgment and thought content normal.  Nursing note and vitals reviewed.  Labs reviewed: Recent Labs    01/08/17 1356 01/10/17 1528 01/11/17 0532 01/24/17 04/14/17 09/05/17  NA 140 140 141 140 142  142 142  K 4.1 4.8 4.1 4.4 4.4  4.4 4.4  CL 111 112* 111  --   --   --   CO2 23 21* 25  --   --   --   GLUCOSE 95 87 92  --   --   --   BUN 35* 30* 28* 28* 34*  --   CREATININE 1.26* 1.13 1.07 1.2 1.3  1.26 1.29  CALCIUM 8.7* 8.8* 8.3*  --  8.8 8.5   Recent Labs    01/11/17 0532  04/14/17 09/05/17  AST 16   < > 15  15 15   ALT 15*   < > 15  15 14   ALKPHOS 38   < > 40  40 40  BILITOT 0.7  --  0.4 39.0  0.4  PROT 5.3*  --  5.4 5.5  ALBUMIN 2.8*  --  3.3 3.4   < > = values in this interval not displayed.   Recent Labs    01/08/17 1356 01/10/17 1528 01/11/17 0532 01/24/17 04/14/17 09/13/17  WBC 6.6 6.9 6.3 6.3 5.7  5.7 5.0  NEUTROABS 4.6 5.3  --   --   --  3,235  HGB 12.4* 12.4* 11.9* 13.4* 14.0  14.0 15.5  HCT 38.6* 39.3 38.1* 42 42  42.1 48  MCV 96.5 97.3 96.2  --   --   --   PLT 141* 152 143* 239 134* 144*   Lab Results  Component Value Date   TSH 2.43 10/03/2017   Lab  Results  Component Value Date   HGBA1C 5.3 07/31/2012   Lab Results  Component Value Date   CHOL 143 10/03/2017   HDL 41 10/03/2017   LDLCALC 74 05/16/2017   LDLCALC 74 05/16/2017   LDLDIRECT 132.5 09/08/2012   TRIG 86 10/03/2017   CHOLHDL 2.7 11/15/2016    Significant Diagnostic Results in last 30 days:  No results found.  Assessment/Plan  Rib pain on left side Left upper sternum and rib joint area hard bulging area none tender to touch and without any redness.normal range of motion to shoulder.suspect possible old trauma to left rib sternum area.No recent fall episodes.will obtain portable rib/sternum area X-ray to rule out fracture.continue current pain regimen.  Family/ staff  Communication: Reviewed plan of care with patient and facility Nurse supervisor.  Labs/tests ordered:portable rib/sternum area X-ray to rule out fracture  Sandrea Hughs, NP

## 2017-10-19 ENCOUNTER — Encounter: Payer: Self-pay | Admitting: Internal Medicine

## 2017-10-26 ENCOUNTER — Encounter: Payer: Self-pay | Admitting: *Deleted

## 2017-10-26 ENCOUNTER — Non-Acute Institutional Stay (SKILLED_NURSING_FACILITY): Payer: Medicare Other | Admitting: Family

## 2017-10-26 DIAGNOSIS — H6123 Impacted cerumen, bilateral: Secondary | ICD-10-CM | POA: Diagnosis not present

## 2017-10-26 DIAGNOSIS — E039 Hypothyroidism, unspecified: Secondary | ICD-10-CM

## 2017-10-26 DIAGNOSIS — E782 Mixed hyperlipidemia: Secondary | ICD-10-CM | POA: Diagnosis not present

## 2017-10-26 DIAGNOSIS — I1310 Hypertensive heart and chronic kidney disease without heart failure, with stage 1 through stage 4 chronic kidney disease, or unspecified chronic kidney disease: Secondary | ICD-10-CM | POA: Diagnosis not present

## 2017-10-26 NOTE — Progress Notes (Signed)
Location:  [facility] Room Number: 34 Place of Service:  SNF [phone]) Provider: Vincenta Steffey FNP-C   [name], MD  Patient Care Team: [name], MD as PCP - General (Internal Medicine) [name], MD as Consulting Physician (Urology) [name], MD as Consulting Physician (Neurology) [name], MD as Consulting Physician (Dermatology) [name], MD (Inactive) as Consulting Physician (Gastroenterology) [name], MD as Consulting Physician (Orthopedic Surgery) [name], MD as Consulting Physician (Cardiology) [name], MD as Consulting Physician (Ophthalmology) [name], MD as Referring Physician (Psychiatry) [name], MD as Consulting Physician (General Surgery) [name], MD as Consulting Physician (Orthopedic Surgery) Esther Bradstreet, [name], NP as Nurse Practitioner (Family Medicine)  Extended Emergency Contact Information Primary Emergency Contact: Palmeri,[name] Address: [address]          [place]          [place], [place] [address] [place] Phone: [phone] Relation: Spouse  Code Status: DNR Goals of care: Advanced Directive information Advanced Directives 10/26/2017  Does Patient Have a Medical Advance Directive? Yes  Type of Paramedic of [place];Out of facility DNR (pink MOST or yellow form);Living will  Does patient want to make changes to medical advance directive? -  Copy of [place] in Chart? Yes  Pre-existing out of facility DNR order (yellow form or pink MOST form) Yellow form placed in chart (order not valid for inpatient use);Pink MOST form placed in chart (order not valid for inpatient use)     Chief Complaint  Patient presents with  . Medical Management of Chronic Issues    HPI:  Pt is a 84 y.o. male seen today [place] for medical management of chronic diseases. He has a medical history  of HTN,Hyperlipidemia,CAD,PAD, BPH without lower urinary tract symptoms,CKD stage 3,AMD,hypothyroidism among other condition.He denies any acute issues during visit.He continues to work with restorative therapy twice per week.He ambulates with Front wheel walker on facility hallways but spends most time sitting on his wheelchair in the room. No recent fall episodes or weight loss. Facility Nurse reports no new concerns.   Past Medical History:  Diagnosis Date  . Acute urinary retention 01/28/2013  . AKI (acute kidney injury) ([place]) 01/26/2013  . Altered mental status 09/09/2013  . Anemia    B12 deficiency  . ANXIETY 06/07/2007   Qualifier: Diagnosis of  By: Talbert Cage CMA ([place]), June    . ARTHRITIS 06/07/2007   Qualifier: Diagnosis of  By: Talbert Cage CMA ([place]), June    . Balance problems 04/05/2011  . Benign essential tremor   . Benign fibroma of prostate 10/11/2011  . Bilateral inguinal hernia 10/09/2012  . Bilateral leg edema 09/13/2012  . Bladder retention 02/05/2013  . Bradycardia, sinus 08/02/2012  . CAROTID ARTERY DISEASE 03/31/2007   Qualifier: Diagnosis of  By: Linna Darner MD, Gwyndolyn Saxon    . Carotid stenosis    s/p L CEA  . Cataract, nuclear 03/21/2014  . 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 Cervical spinal stenosis   . Cervical stenosis of spine   . Critical lower limb ischemia   . Degeneration macular 11/02/2011  . Depression    Dr Albertine Patricia  . Difficulty in walking(719.7) 04/05/2011  . ESOPHAGITIS 08/06/2002   Qualifier: Diagnosis of  By: Talbert Cage CMA ([place]), June    . Essential and other specified forms of tremor 04/18/2013  . Fall at home 01/06/2017   left hip pain  .  Falls   . Family history of colon cancer 07/24/2012  . Fecal impaction ([place]) 10/11/2012  . Femoral hernia, bilateral s/p lap repair 01/16/2013 01/16/2013  . Glaucoma, compensated 03/21/2014  . H/O cardiovascular stress test    a. 02/2003 -> no ischemia/infarct.  Marland Kitchen HAMMER TOE 04/23/2010   Qualifier: Diagnosis of  By: Linna Darner MD,  Gwyndolyn Saxon    . Hematoma of leg   . Hip hematoma, right 09/09/2013  . History of shingles 2009  . HTN (hypertension) 08/22/2012  . Hx of echocardiogram    Echocardiogram 08/01/12: Mild LVH, EF 55-60%, normal wall motion.  . Hydrocele 10/11/2011  . Hyperlipidemia   . 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 Hypothyroidism   . Macular degeneration disease   . Macular edema   . Migraines    Dr Jannifer Franklin  . MORTON'S NEUROMA, LEFT 08/22/2007   Qualifier: Diagnosis of  By: Linna Darner MD, Gwyndolyn Saxon    . PAD (peripheral artery disease) ([place])   . Peripheral neuropathy   . Personal history of colonic polyps 01/05/2013   2014 2 mm adenomatous polyp   . PREMATURE ATRIAL CONTRACTIONS 03/14/2006   Qualifier: Diagnosis of  By: Linna Darner MD, Gwendolyn Lima 12/16/2011  . SPINAL STENOSIS 03/14/2006   Qualifier: Diagnosis of  By: Linna Darner MD, William   Cervical spine    . Testosterone deficiency    Dr Lawerance Bach, [facility]  . Unspecified vitamin D deficiency 08/06/2012  . Urge incontinence 10/09/2012  . Urinary tract infection, site not specified 02/13/2013  . Weakness 08/02/2012   Past Surgical History:  Procedure Laterality Date  . APPENDECTOMY  1941  . BREAST CYST EXCISION Left 01/16/2013   Procedure: MASS EXCISION AXILLA;  Surgeon: Adin Hector, MD;  Location: [facility];  Service: General;  Laterality: Left;  . CAROTID ENDARTERECTOMY  2005    L  . CATARACT EXTRACTION Right   . COLONOSCOPY  2014   Dr. Deatra Ina  . EYE SURGERY Right 02/2012   retina  . INGUINAL HERNIA REPAIR Bilateral 01/16/2013   Procedure: LAPAROSCOPIC BILATERAL INGUINAL DIRECT AND INDIRECT, FEMORAL, AND OBTURATOR HERNIAS;  Surgeon: Adin Hector, MD;  Location: [facility];  Service: General;  Laterality: Bilateral;  . INSERTION OF MESH N/A 01/16/2013   Procedure: INSERTION OF MESH;  Surgeon: Adin Hector, MD;  Location: [facility];  Service: General;  Laterality: N/A;  . LUMBAR LAMINECTOMY    . TONSILLECTOMY AND ADENOIDECTOMY    . TOTAL HIP  ARTHROPLASTY  2005   L    Allergies  Allergen Reactions  . Latex Rash  . Tape Rash    Paper tape is ok to use     Allergies as of 10/26/2017      Reactions   Latex Rash   Tape Rash   Paper tape is ok to use      Medication List        Accurate as of 10/26/17  9:18 PM. Always use your most recent med list.          acetaminophen 500 MG tablet Commonly known as:  TYLENOL Take 1,000 mg by mouth 2 (two) times daily.   amLODipine 5 MG tablet Commonly known as:  NORVASC Take 5 mg by mouth daily.   aspirin 81 MG tablet Take 81 mg by mouth every morning.   atorvastatin 10 MG tablet Commonly known as:  LIPITOR Take 10 mg by mouth daily.   B-complex with vitamin C tablet Take 1 tablet by mouth daily.  bimatoprost 0.01 % Soln Commonly known as:  LUMIGAN Place 1 drop into both eyes at bedtime.   buPROPion 300 MG 24 hr tablet Commonly known as:  WELLBUTRIN XL Take 300 mg by mouth daily.   feeding supplement (PRO-STAT SUGAR FREE 64) Liqd Take 30 mLs by mouth daily.   fluticasone 0.05 % cream Commonly known as:  CUTIVATE Apply 1 application topically 2 (two) times daily as needed (irritation).   ICAPS PO Take 1 tablet by mouth daily.   ketoconazole 2 % cream Commonly known as:  NIZORAL Apply 1 application topically daily as needed for irritation.   Krill Oil 500 MG Caps Take 500 mg by mouth every morning.   levothyroxine 50 MCG tablet Commonly known as:  SYNTHROID, LEVOTHROID Take 50 mcg by mouth daily before breakfast. Take one tablet daily except on Sunday's take 1/2 tablet.   linaclotide 145 MCG Caps capsule Commonly known as:  LINZESS Take 1 capsule (145 mcg total) by mouth daily before breakfast.   Melatonin 3 MG Tabs Take 1 tablet by mouth at bedtime.   mirtazapine 30 MG tablet Commonly known as:  REMERON Take 1 tablet (30 mg total) by mouth at bedtime.   polyethylene glycol packet Commonly known as:  MIRALAX / GLYCOLAX Take 17 g by mouth  daily.   sertraline 100 MG tablet Commonly known as:  ZOLOFT Take 200 mg by mouth every morning.   SYSTANE 0.4-0.3 % Soln Generic drug:  Polyethyl Glycol-Propyl Glycol Apply 1 drop to eye 4 (four) times daily.   tamsulosin 0.4 MG Caps capsule Commonly known as:  FLOMAX TAKE 1 CAPSULE (0.4 MG TOTAL) BY MOUTH DAILY.   topiramate 25 MG tablet Commonly known as:  TOPAMAX TAKE 1 TABLET BY MOUTH EVERY MORNING AND 2 TABLETS BY MOUTH EVERY EVENING   Vitamin D3 1000 units Caps Take 2,000 Units by mouth daily.   zinc oxide 20 % ointment Apply 1 application topically as needed.       Review of Systems  Constitutional: Negative for appetite change, chills, fatigue, fever and unexpected weight change.  HENT: Positive for hearing loss. Negative for congestion, rhinorrhea, sinus pressure, sinus pain, sneezing, sore throat and trouble swallowing.   Eyes: Positive for visual disturbance. Negative for discharge, redness and itching.       Hx AMD  Respiratory: Negative for cough, chest tightness, shortness of breath and wheezing.   Cardiovascular: Negative for chest pain, palpitations and leg swelling.  Gastrointestinal: Negative for abdominal distention, abdominal pain, constipation, diarrhea, nausea and vomiting.  Endocrine: Negative for cold intolerance, heat intolerance, polydipsia, polyphagia and polyuria.  Genitourinary: Negative for dysuria, flank pain and urgency.  Musculoskeletal: Positive for arthralgias and gait problem.       Chronic lower back pain   Skin: Negative for color change, pallor, rash and wound.  Neurological: Negative for dizziness, light-headedness and headaches.  Hematological: Does not bruise/bleed easily.  Psychiatric/Behavioral: Negative for agitation and sleep disturbance. The patient is not nervous/anxious.     Immunization History  Administered Date(s) Administered  . DTaP 11/05/2005  . Hepatitis B [birth year]  . Influenza Split 11/12/2011, 11/29/2013  .  Influenza Whole 10/30/2007, 11/12/2008, 11/26/2009  . Influenza, High Dose Seasonal PF 12/31/2015, 11/24/2016  . Influenza,inj,quad, With Preservative 10/16/2016  . PPD Test 06/23/2011  . Pneumococcal Conjugate-13 01/14/2016  . Pneumococcal Polysaccharide-23 02/16/1999  . Tdap 01/13/2017  . Varicella 11/09/2005  . Zoster 08/30/2006   Pertinent  Health Maintenance Due  Topic Date Due  . INFLUENZA  VACCINE  09/15/2017  . PNA vac Low Risk Adult  Completed   Fall Risk  10/27/2016 06/09/2016 02/03/2016 10/28/2015 01/16/2015  Falls in the past year? Yes Yes Yes Yes Yes  Number falls in past yr: 2 or more 2 or more 2 or more 2 or more 2 or more  Comment - - 2 falls in last 2 months - -  Injury with Fall? Yes No Yes No (No Data)  Comment L hip - scraps and bruises - head  Risk Factor Category  - - - - -  Risk for fall due to : - - - Impaired balance/gait -  Follow up - - - - -   Functional Status Survey: Is the patient deaf or have difficulty hearing?: Yes Does the patient have difficulty seeing, even when wearing glasses/contacts?: Yes Does the patient have difficulty concentrating, remembering, or making decisions?: Yes Does the patient have difficulty walking or climbing stairs?: Yes Does the patient have difficulty dressing or bathing?: Yes Does the patient have difficulty doing errands alone such as visiting a doctor's office or shopping?: Yes  Vitals:   10/26/17 1359  BP: 126/67  Pulse: (!) 52  Resp: 18  Temp: (!) 96.7 F (35.9 C)  SpO2: 95%  Weight: 150 lb 9.6 oz (68.3 kg)  Height: 6' (1.829 m)   Body mass index is 20.43 kg/m. Physical Exam  Constitutional: He is oriented to person, place, and time.  Tall thin built,frail elderly in no acute distress   HENT:  Head: Normocephalic.  Mouth/Throat: Oropharynx is clear and moist. No oropharyngeal exudate.  Bilateral ear brown cerumen impaction  Eyes: Conjunctivae are normal. Right eye exhibits no discharge. Left eye exhibits  no discharge. No scleral icterus.  Left eye blindness.right eye pupil round and reactive to light   Neck: Normal range of motion. No JVD present. No thyromegaly present.  Cardiovascular: Regular rhythm, normal heart sounds and intact distal pulses. Bradycardia present. Exam reveals no gallop and no friction rub.  No murmur heard. Pulmonary/Chest: Effort normal and breath sounds normal. No respiratory distress. He has no wheezes. He has no rales.  Abdominal: Soft. Bowel sounds are normal. He exhibits no distension and no mass. There is no tenderness. There is no rebound and no guarding.  Musculoskeletal: Normal range of motion. He exhibits no tenderness.  Unsteady gait ambulates with FWW and self propel on wheelchair for long distances.No recent fall episodes.bilateral lower extremities trace edema.   Lymphadenopathy:    He has no cervical adenopathy.  Neurological: He is oriented to person, place, and time. Gait abnormal.  Left eye blind   Skin: Skin is warm and dry. No rash noted. No erythema. No pallor.  Psychiatric: He has a normal mood and affect. His behavior is normal. Judgment normal.  Difficult finding words at times   Nursing note and vitals reviewed.   Labs reviewed: Recent Labs    01/08/17 1356 01/10/17 1528 01/11/17 0532 01/24/17 04/14/17 09/05/17  NA 140 140 141 140 142  142 142  K 4.1 4.8 4.1 4.4 4.4  4.4 4.4  CL 111 112* 111  --   --   --   CO2 23 21* 25  --   --   --   GLUCOSE 95 87 92  --   --   --   BUN 35* 30* 28* 28* 34*  --   CREATININE 1.26* 1.13 1.07 1.2 1.3  1.26 1.29  CALCIUM 8.7* 8.8* 8.3*  --  8.8  8.5   Recent Labs    01/11/17 0532  04/14/17 09/05/17  AST 16   < > 15  15 15   ALT 15*   < > 15  15 14   ALKPHOS 38   < > 40  40 40  BILITOT 0.7  --  0.4 39.0  0.4  PROT 5.3*  --  5.4 5.5  ALBUMIN 2.8*  --  3.3 3.4   < > = values in this interval not displayed.   Recent Labs    01/08/17 1356 01/10/17 1528 01/11/17 0532 01/24/17 04/14/17  09/13/17  WBC 6.6 6.9 6.3 6.3 5.7  5.7 5.0  NEUTROABS 4.6 5.3  --   --   --  3,235  HGB 12.4* 12.4* 11.9* 13.4* 14.0  14.0 15.5  HCT 38.6* 39.3 38.1* 42 42  42.1 48  MCV 96.5 97.3 96.2  --   --   --   PLT 141* 152 143* 239 134* 144*   Lab Results  Component Value Date   TSH 2.43 10/03/2017   Lab Results  Component Value Date   HGBA1C 5.3 07/31/2012   Lab Results  Component Value Date   CHOL 143 10/03/2017   HDL 41 10/03/2017   LDLCALC 74 05/16/2017   LDLCALC 74 05/16/2017   LDLDIRECT 132.5 09/08/2012   TRIG 86 10/03/2017   CHOLHDL 2.7 11/15/2016    Significant Diagnostic Results in last 30 days:  No results found.  Assessment/Plan 1. 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 Acquired hypothyroidism Lab Results  Component Value Date   TSH 2.43 10/03/2017  Continue on levothyroxine 50 mcg tablet daily before breakfast.  3. Hyperlipidemia  Continue on Lipitor 10 mg tablet daily.monitor lipid panel.  4. Bilateral impacted cerumen Bilateral TM not visualized due to soft brown cerumen impaction.Debrox otic drops ordered in previous visit.Facility Nurse to lavage both ears with warm water then notify provider to re-evaluate.   Family/ staff Communication: Reviewed plan of care with patient and facility Nurse.   Labs/tests ordered: None   Shuaib Corsino C Brooke Payes, NP

## 2017-10-27 NOTE — Progress Notes (Signed)
Opened in error

## 2017-10-28 ENCOUNTER — Non-Acute Institutional Stay (SKILLED_NURSING_FACILITY): Payer: Medicare Other

## 2017-10-28 DIAGNOSIS — Z0000 Encounter for general adult medical examination without abnormal findings: Secondary | ICD-10-CM | POA: Diagnosis not present

## 2017-10-28 NOTE — Progress Notes (Signed)
Subjective:   Jose Leach is a 84 y.o. male who presents for Medicare Annual/Subsequent preventive examination at [place]  Last AWV-10/27/2016    Objective:    Vitals: BP 128/63 (BP Location: Left Arm, Patient Position: Sitting)   Pulse 60   Temp 97.6 F (36.4 C) (Oral)   Ht 6' (1.829 m)   Wt 150 lb (68 kg)   BMI 20.34 kg/m   Body mass index is 20.34 kg/m.  Advanced Directives 10/28/2017 10/26/2017 10/18/2017 09/28/2017 09/13/2017 09/06/2017 08/31/2017  Does Patient Have a Medical Advance Directive? Yes Yes Yes Yes Yes Yes Yes  Type of Paramedic of [place];Out of facility DNR (pink MOST or yellow form);Living will [place];Out of facility DNR (pink MOST or yellow form);Living will [place];Out of facility DNR (pink MOST or yellow form);Living will [place];Out of facility DNR (pink MOST or yellow form);Living will [place];Out of facility DNR (pink MOST or yellow form);Living will [place];Out of facility DNR (pink MOST or yellow form);Living will [place];Out of facility DNR (pink MOST or yellow form);Living will  Does patient want to make changes to medical advance directive? No - Patient declined - - - - - -  Copy of [place] in Chart? Yes Yes Yes Yes Yes Yes Yes  Pre-existing out of facility DNR order (yellow form or pink MOST form) Yellow form placed in chart (order not valid for inpatient use);Pink MOST form placed in chart (order not valid for inpatient use) Yellow form placed in chart (order not valid for inpatient use);Pink MOST form placed in chart (order not valid for inpatient use) Yellow form placed in chart (order not valid for inpatient use);Pink MOST form placed in chart (order not valid for inpatient use) Yellow form placed in chart (order not valid for inpatient use);Pink MOST form  placed in chart (order not valid for inpatient use) Yellow form placed in chart (order not valid for inpatient use);Pink MOST form placed in chart (order not valid for inpatient use) Yellow form placed in chart (order not valid for inpatient use);Pink MOST form placed in chart (order not valid for inpatient use) Yellow form placed in chart (order not valid for inpatient use);Pink MOST form placed in chart (order not valid for inpatient use)    Tobacco Social History   Tobacco Use  Smoking Status Former Smoker  . Years: 10.00  . Types: Pipe, Cigars  . Last attempt to quit: 02/16/1979  . Years since quitting: 38.7  Smokeless Tobacco Never Used  Tobacco Comment   Quit about age 9      Counseling given: Not Answered Comment: Quit about age 78    Clinical Intake:  Pre-visit preparation completed: No  Pain : No/denies pain     Nutritional Risks: None Diabetes: No  How often do you need to have someone help you when you read instructions, pamphlets, or other written materials from your doctor or pharmacy?: 3 - Sometimes What is the last grade level you completed in school?: [place]?: No  Information entered by :: Tyson Dense, RN  Past Medical History:  Diagnosis Date  . Acute urinary retention 01/28/2013  . AKI (acute kidney injury) ([place]) 01/26/2013  . Altered mental status 09/09/2013  . Anemia    B12 deficiency  . ANXIETY 06/07/2007   Qualifier: Diagnosis of  By: Talbert Cage CMA ([place]), June    .  ARTHRITIS 06/07/2007   Qualifier: Diagnosis of  By: Talbert Cage CMA ([place]), June    . Balance problems 04/05/2011  . Benign essential tremor   . Benign fibroma of prostate 10/11/2011  . Bilateral inguinal hernia 10/09/2012  . Bilateral leg edema 09/13/2012  . Bladder retention 02/05/2013  . Bradycardia, sinus 08/02/2012  . CAROTID ARTERY DISEASE 03/31/2007   Qualifier: Diagnosis of  By: Linna Darner MD, Gwyndolyn Saxon    . Carotid stenosis    s/p L CEA  . Cataract, nuclear  03/21/2014  . 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 Cervical spinal stenosis   . Cervical stenosis of spine   . Critical lower limb ischemia   . Degeneration macular 11/02/2011  . Depression    Dr Albertine Patricia  . Difficulty in walking(719.7) 04/05/2011  . ESOPHAGITIS 08/06/2002   Qualifier: Diagnosis of  By: Talbert Cage CMA ([place]), June    . Essential and other specified forms of tremor 04/18/2013  . Fall at home 01/06/2017   left hip pain  . Falls   . Family history of colon cancer 07/24/2012  . Fecal impaction ([place]) 10/11/2012  . Femoral hernia, bilateral s/p lap repair 01/16/2013 01/16/2013  . Glaucoma, compensated 03/21/2014  . H/O cardiovascular stress test    a. 02/2003 -> no ischemia/infarct.  Marland Kitchen HAMMER TOE 04/23/2010   Qualifier: Diagnosis of  By: Linna Darner MD, Gwyndolyn Saxon    . Hematoma of leg   . Hip hematoma, right 09/09/2013  . History of shingles 2009  . HTN (hypertension) 08/22/2012  . Hx of echocardiogram    Echocardiogram 08/01/12: Mild LVH, EF 55-60%, normal wall motion.  . Hydrocele 10/11/2011  . Hyperlipidemia   . 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 Hypothyroidism   . Macular degeneration disease   . Macular edema   . Migraines    Dr Jannifer Franklin  . MORTON'S NEUROMA, LEFT 08/22/2007   Qualifier: Diagnosis of  By: Linna Darner MD, Gwyndolyn Saxon    . PAD (peripheral artery disease) ([place])   . Peripheral neuropathy   . Personal history of colonic polyps 01/05/2013   2014 2 mm adenomatous polyp   . PREMATURE ATRIAL CONTRACTIONS 03/14/2006   Qualifier: Diagnosis of  By: Linna Darner MD, Gwendolyn Lima 12/16/2011  . SPINAL STENOSIS 03/14/2006   Qualifier: Diagnosis of  By: Linna Darner MD, [place]   Cervical spine    . Testosterone deficiency    Dr Lawerance Bach, [facility]  . Unspecified vitamin D deficiency 08/06/2012  . Urge incontinence 10/09/2012  . Urinary tract infection, site not specified 02/13/2013  . Weakness 08/02/2012   Past Surgical History:  Procedure Laterality Date  . APPENDECTOMY  1941  . BREAST CYST  EXCISION Left 01/16/2013   Procedure: MASS EXCISION AXILLA;  Surgeon: Adin Hector, MD;  Location: [facility];  Service: General;  Laterality: Left;  . CAROTID ENDARTERECTOMY  2005    L  . CATARACT EXTRACTION Right   . COLONOSCOPY  2014   Dr. Deatra Ina  . EYE SURGERY Right 02/2012   retina  . INGUINAL HERNIA REPAIR Bilateral 01/16/2013   Procedure: LAPAROSCOPIC BILATERAL INGUINAL DIRECT AND INDIRECT, FEMORAL, AND OBTURATOR HERNIAS;  Surgeon: Adin Hector, MD;  Location: [facility];  Service: General;  Laterality: Bilateral;  . INSERTION OF MESH N/A 01/16/2013   Procedure: INSERTION OF MESH;  Surgeon: Adin Hector, MD;  Location: [facility];  Service: General;  Laterality: N/A;  . LUMBAR LAMINECTOMY    . TONSILLECTOMY AND ADENOIDECTOMY    . TOTAL HIP ARTHROPLASTY  2005  L   Family History  Problem Relation Age of Onset  . Stroke Mother 37  . Hypertension Mother   . Diabetes Mother   . Heart disease Mother   . Rectal cancer Father 72  . Heart disease Father        in 5s  . Cancer Father        colorectal  . Nephritis Sister   . Seizures Brother        died as child   Social History   Socioeconomic History  . Marital status: Married    Spouse name: [name]  . Number of children: 2  . Years of education: Not on file  . Highest education level: Not on file  Occupational History  . Occupation: retired     Fish farm manager: RETIRED  Social Needs  . Financial resource strain: Not hard at all  . Food insecurity:    Worry: Never true    Inability: Never true  . Transportation needs:    Medical: No    Non-medical: No  Tobacco Use  . Smoking status: Former Smoker    Years: 10.00    Types: Pipe, Cigars    Last attempt to quit: 02/16/1979    Years since quitting: 38.7  . Smokeless tobacco: Never Used  . Tobacco comment: Quit about age 55   Substance and Sexual Activity  . Alcohol use: No    Alcohol/week: 4.0 standard drinks    Types: 4 Standard drinks or equivalent per week  . Drug use: No   . Sexual activity: Never  Lifestyle  . Physical activity:    Days per week: 2 days    Minutes per session: 60 min  . Stress: Not at all  Relationships  . Social connections:    Talks on phone: More than three times a week    Gets together: More than three times a week    Attends religious service: Never    Active member of club or organization: No    Attends meetings of clubs or organizations: Never    Relationship status: Married  Other Topics Concern  . Not on file  Social History Narrative   Lives at [facility] since 2013   Married - [name]   Former smoker -stopped 1981   Alcohol -wine occasionally    Exercise - walking, Ne-step    POA, Living Will   Walks with walker    Outpatient Encounter Medications as of 10/28/2017  Medication Sig  . acetaminophen (TYLENOL) 500 MG tablet Take 1,000 mg by mouth 2 (two) times daily.  . Amino Acids-Protein Hydrolys (FEEDING SUPPLEMENT, PRO-STAT SUGAR FREE 64,) LIQD Take 30 mLs by mouth daily.  Marland Kitchen amLODipine (NORVASC) 5 MG tablet Take 5 mg by mouth daily.  Marland Kitchen aspirin 81 MG tablet Take 81 mg by mouth every morning.   Marland Kitchen atorvastatin (LIPITOR) 10 MG tablet Take 10 mg by mouth daily.  . B Complex-C (B-COMPLEX WITH VITAMIN C) tablet Take 1 tablet by mouth daily.  . bimatoprost (LUMIGAN) 0.01 % SOLN Place 1 drop into both eyes at bedtime.  Marland Kitchen buPROPion (WELLBUTRIN XL) 300 MG 24 hr tablet Take 300 mg by mouth daily.  . Cholecalciferol (VITAMIN D3) 1000 units CAPS Take 2,000 Units by mouth daily.   . fluticasone (CUTIVATE) 0.05 % cream Apply 1 application topically 2 (two) times daily as needed (irritation).   Marland Kitchen ketoconazole (NIZORAL) 2 % cream Apply 1 application topically daily as needed for irritation.   Javier Docker  Oil 500 MG CAPS Take 500 mg by mouth every morning.   Marland Kitchen levothyroxine (SYNTHROID, LEVOTHROID) 50 MCG tablet Take 50 mcg by mouth daily before breakfast. Take one tablet daily except on Sunday's take 1/2 tablet.  . linaclotide  (LINZESS) 145 MCG CAPS capsule Take 1 capsule (145 mcg total) by mouth daily before breakfast.  . Melatonin 3 MG TABS Take 1 tablet by mouth at bedtime.  . mirtazapine (REMERON) 30 MG tablet Take 1 tablet (30 mg total) by mouth at bedtime.  . Multiple Vitamins-Minerals (ICAPS PO) Take 1 tablet by mouth daily.  Vladimir Faster Glycol-Propyl Glycol (SYSTANE) 0.4-0.3 % SOLN Apply 1 drop to eye 4 (four) times daily.  . polyethylene glycol (MIRALAX / GLYCOLAX) packet Take 17 g by mouth daily.   . sertraline (ZOLOFT) 100 MG tablet Take 200 mg by mouth every morning.   . tamsulosin (FLOMAX) 0.4 MG CAPS capsule TAKE 1 CAPSULE (0.4 MG TOTAL) BY MOUTH DAILY.  Marland Kitchen topiramate (TOPAMAX) 25 MG tablet TAKE 1 TABLET BY MOUTH EVERY MORNING AND 2 TABLETS BY MOUTH EVERY EVENING  . zinc oxide 20 % ointment Apply 1 application topically as needed.    No facility-administered encounter medications on file as of 10/28/2017.     Activities of Daily Living In your present state of health, do you have any difficulty performing the following activities: 10/28/2017 10/26/2017  Hearing? Tempie Donning  Vision? Y Y  Difficulty concentrating or making decisions? Tempie Donning  Walking or climbing stairs? Y Y  Dressing or bathing? Y Y  Doing errands, shopping? Tempie Donning  Preparing Food and eating ? Y -  Using the Toilet? Y -  In the past six months, have you accidently leaked urine? Y -  Do you have problems with loss of bowel control? Y -  Managing your Medications? Y -  Managing your Finances? Y -  Housekeeping or managing your Housekeeping? Y -  Some recent data might be hidden    Patient Care Team: [name], MD as PCP - General (Internal Medicine) [name], MD as Consulting Physician (Urology) [name], MD as Consulting Physician (Neurology) [name], MD as Consulting Physician (Dermatology) [name], MD (Inactive) as Consulting Physician (Gastroenterology) [name], MD as Consulting Physician  (Orthopedic Surgery) [name], MD as Consulting Physician (Cardiology) [name], MD as Consulting Physician (Ophthalmology) [name], MD as Referring Physician (Psychiatry) [name], MD as Consulting Physician (General Surgery) [name], MD as Consulting Physician (Orthopedic Surgery) [name], [name], NP as Nurse Practitioner (Family Medicine)   Assessment:   This is a routine wellness examination for Jose Leach.  Exercise Activities and Dietary recommendations Current Exercise Habits: Structured exercise class, Type of exercise: strength training/weights, Time (Minutes): 60, Frequency (Times/Week): 2, Weekly Exercise (Minutes/Week): 120, Intensity: Mild, Exercise limited by: orthopedic condition(s)  Goals   None     Fall Risk Fall Risk  10/28/2017 10/27/2016 06/09/2016 02/03/2016 10/28/2015  Falls in the past year? Yes Yes Yes Yes Yes  Number falls in past yr: 1 2 or more 2 or more 2 or more 2 or more  Comment - - - 2 falls in last 2 months -  Injury with Fall? Yes Yes No Yes No  Comment - L hip - scraps and bruises -  Risk Factor Category  - - - - -  Risk for fall due to : - - - - Impaired balance/gait  Follow up - - - - -  Is the patient's home free of loose throw rugs in walkways, pet beds, electrical cords, etc?   yes      Grab bars in the bathroom? yes      Handrails on the stairs?   yes      Adequate lighting?   yes  Depression Screen PHQ 2/9 Scores 10/28/2017 10/27/2016 06/09/2016 10/28/2015  PHQ - 2 Score 0 1 0 3  PHQ- 9 Score - - - 4    Cognitive Function MMSE - Mini Mental State Exam 10/28/2017 10/27/2016  Orientation to time 4 4  Orientation to Place 4 5  Registration 3 3  Attention/ Calculation 4 4  Recall 0 0  Language- name 2 objects 2 2  Language- repeat 1 1  Language- follow 3 step command 3 3  Language- read & follow direction 1 1  Write a sentence 1 1  Copy design 1 1  Total score 24 25        Immunization History    Administered Date(s) Administered  . DTaP 11/05/2005  . Hepatitis B [birth year]  . Influenza Split 11/12/2011, 11/29/2013  . Influenza Whole 10/30/2007, 11/12/2008, 11/26/2009  . Influenza, High Dose Seasonal PF 12/31/2015, 11/24/2016  . Influenza,inj,quad, With Preservative 10/16/2016  . PPD Test 06/23/2011  . Pneumococcal Conjugate-13 01/14/2016  . Pneumococcal Polysaccharide-23 02/16/1999  . Tdap 01/13/2017  . Varicella 11/09/2005  . Zoster 08/30/2006    Qualifies for Shingles Vaccine? Not in past records  Screening Tests Health Maintenance  Topic Date Due  . INFLUENZA VACCINE  09/15/2017  . TETANUS/TDAP  01/14/2027  . PNA vac Low Risk Adult  Completed   Cancer Screenings: Lung: Low Dose CT Chest recommended if Age 48-80 years, 30 pack-year currently smoking OR have quit w/in 15years. Patient does not qualify. Colorectal: up to date  Additional Screenings:  Hepatitis C Screening: declined Flu vaccine due: will receive at [place]:  I have personally reviewed and addressed the Medicare Annual Wellness questionnaire and have noted the following in the patient's chart:  A. Medical and social history B. Use of alcohol, tobacco or illicit drugs  C. Current medications and supplements D. Functional ability and status E.  Nutritional status F.  Physical activity G. Advance directives H. List of other physicians I.  Hospitalizations, surgeries, and ER visits in previous 12 months J.  [place] to include hearing, vision, cognitive, depression L. Referrals and appointments - none  In addition, I have reviewed and discussed with patient certain preventive protocols, quality metrics, and best practice recommendations. A written personalized care plan for preventive services as well as general preventive health recommendations were provided to patient.  See attached scanned questionnaire for additional information.   Signed,   Tyson Dense, RN Nurse  Health Advisor  Patient Concerns: None

## 2017-10-28 NOTE — Patient Instructions (Signed)
Jose Leach , Thank you for taking time to come for your Medicare Wellness Visit. I appreciate your ongoing commitment to your health goals. Please review the following plan we discussed and let me know if I can assist you in the future.   Screening recommendations/referrals: Colonoscopy excluded, over age 82 Recommended yearly ophthalmology/optometry visit for glaucoma screening and checkup Recommended yearly dental visit for hygiene and checkup  Vaccinations: Influenza vaccine due, will receive at [facility] Pneumococcal vaccine up to date, completed Tdap vaccine up to date, due 01/14/2027 Shingles vaccine not in past records    Advanced directives: in chart  Conditions/risks identified: none  Next appointment: Dr. Bubba Camp makes rounds  Preventive Care 57 Years and Older, Male Preventive care refers to lifestyle choices and visits with your health care provider that can promote health and wellness. What does preventive care include?  A yearly physical exam. This is also called an annual well check.  Dental exams once or twice a year.  Routine eye exams. Ask your health care provider how often you should have your eyes checked.  Personal lifestyle choices, including:  Daily care of your teeth and gums.  Regular physical activity.  Eating a healthy diet.  Avoiding tobacco and drug use.  Limiting alcohol use.  Practicing safe sex.  Taking low doses of aspirin every day.  Taking vitamin and mineral supplements as recommended by your health care provider. What happens during an annual well check? The services and screenings done by your health care provider during your annual well check will depend on your age, overall health, lifestyle risk factors, and family history of disease. Counseling  Your health care provider may ask you questions about your:  Alcohol use.  Tobacco use.  Drug use.  Emotional well-being.  Home and relationship well-being.  Sexual  activity.  Eating habits.  History of falls.  Memory and ability to understand (cognition).  Work and work Statistician. Screening  You may have the following tests or measurements:  Height, weight, and BMI.  Blood pressure.  Lipid and cholesterol levels. These may be checked every 5 years, or more frequently if you are over 91 years old.  Skin check.  Lung cancer screening. You may have this screening every year starting at age 76 if you have a 30-pack-year history of smoking and currently smoke or have quit within the past 15 years.  Fecal occult blood test (FOBT) of the stool. You may have this test every year starting at age 18.  Flexible sigmoidoscopy or colonoscopy. You may have a sigmoidoscopy every 5 years or a colonoscopy every 10 years starting at age 63.  Prostate cancer screening. Recommendations will vary depending on your family history and other risks.  Hepatitis C blood test.  Hepatitis B blood test.  Sexually transmitted disease (STD) testing.  Diabetes screening. This is done by checking your blood sugar (glucose) after you have not eaten for a while (fasting). You may have this done every 1-3 years.  Abdominal aortic aneurysm (AAA) screening. You may need this if you are a current or former smoker.  Osteoporosis. You may be screened starting at age 35 if you are at high risk. Talk with your health care provider about your test results, treatment options, and if necessary, the need for more tests. Vaccines  Your health care provider may recommend certain vaccines, such as:  Influenza vaccine. This is recommended every year.  Tetanus, diphtheria, and acellular pertussis (Tdap, Td) vaccine. You may need a Td booster  every 10 years.  Zoster vaccine. You may need this after age 22.  Pneumococcal 13-valent conjugate (PCV13) vaccine. One dose is recommended after age 14.  Pneumococcal polysaccharide (PPSV23) vaccine. One dose is recommended after age  75. Talk to your health care provider about which screenings and vaccines you need and how often you need them. This information is not intended to replace advice given to you by your health care provider. Make sure you discuss any questions you have with your health care provider. Document Released: 02/28/2015 Document Revised: 10/22/2015 Document Reviewed: 12/03/2014 Elsevier Interactive Patient Education  2017 [place] Prevention in the Home Falls can cause injuries. They can happen to people of all ages. There are many things you can do to make your home safe and to help prevent falls. What can I do on the outside of my home?  Regularly fix the edges of walkways and driveways and fix any cracks.  Remove anything that might make you trip as you walk through a door, such as a raised step or threshold.  Trim any bushes or trees on the path to your home.  Use bright outdoor lighting.  Clear any walking paths of anything that might make someone trip, such as rocks or tools.  Regularly check to see if handrails are loose or broken. Make sure that both sides of any steps have handrails.  Any raised decks and porches should have guardrails on the edges.  Have any leaves, snow, or ice cleared regularly.  Use sand or salt on walking paths during winter.  Clean up any spills in your garage right away. This includes oil or grease spills. What can I do in the bathroom?  Use night lights.  Install grab bars by the toilet and in the tub and shower. Do not use towel bars as grab bars.  Use non-skid mats or decals in the tub or shower.  If you need to sit down in the shower, use a plastic, non-slip stool.  Keep the floor dry. Clean up any water that spills on the floor as soon as it happens.  Remove soap buildup in the tub or shower regularly.  Attach bath mats securely with double-sided non-slip rug tape.  Do not have throw rugs and other things on the floor that can make  you trip. What can I do in the bedroom?  Use night lights.  Make sure that you have a light by your bed that is easy to reach.  Do not use any sheets or blankets that are too big for your bed. They should not hang down onto the floor.  Have a firm chair that has side arms. You can use this for support while you get dressed.  Do not have throw rugs and other things on the floor that can make you trip. What can I do in the kitchen?  Clean up any spills right away.  Avoid walking on wet floors.  Keep items that you use a lot in easy-to-reach places.  If you need to reach something above you, use a strong step stool that has a grab bar.  Keep electrical cords out of the way.  Do not use floor polish or wax that makes floors slippery. If you must use wax, use non-skid floor wax.  Do not have throw rugs and other things on the floor that can make you trip. What can I do with my stairs?  Do not leave any items on the stairs.  Make  sure that there are handrails on both sides of the stairs and use them. Fix handrails that are broken or loose. Make sure that handrails are as long as the stairways.  Check any carpeting to make sure that it is firmly attached to the stairs. Fix any carpet that is loose or worn.  Avoid having throw rugs at the top or bottom of the stairs. If you do have throw rugs, attach them to the floor with carpet tape.  Make sure that you have a light switch at the top of the stairs and the bottom of the stairs. If you do not have them, ask someone to add them for you. What else can I do to help prevent falls?  Wear shoes that:  Do not have high heels.  Have rubber bottoms.  Are comfortable and fit you well.  Are closed at the toe. Do not wear sandals.  If you use a stepladder:  Make sure that it is fully opened. Do not climb a closed stepladder.  Make sure that both sides of the stepladder are locked into place.  Ask someone to hold it for you, if  possible.  Clearly mark and make sure that you can see:  Any grab bars or handrails.  First and last steps.  Where the edge of each step is.  Use tools that help you move around (mobility aids) if they are needed. These include:  Canes.  Walkers.  Scooters.  Crutches.  Turn on the lights when you go into a dark area. Replace any light bulbs as soon as they burn out.  Set up your furniture so you have a clear path. Avoid moving your furniture around.  If any of your floors are uneven, fix them.  If there are any pets around you, be aware of where they are.  Review your medicines with your doctor. Some medicines can make you feel dizzy. This can increase your chance of falling. Ask your doctor what other things that you can do to help prevent falls. This information is not intended to replace advice given to you by your health care provider. Make sure you discuss any questions you have with your health care provider. Document Released: 11/28/2008 Document Revised: 07/10/2015 Document Reviewed: 03/08/2014 Elsevier Interactive Patient Education  2017 [employer].

## 2017-11-09 ENCOUNTER — Encounter: Payer: Self-pay | Admitting: Family

## 2017-11-09 ENCOUNTER — Non-Acute Institutional Stay (SKILLED_NURSING_FACILITY): Payer: Medicare Other | Admitting: Family

## 2017-11-09 DIAGNOSIS — R509 Fever, unspecified: Secondary | ICD-10-CM

## 2017-11-09 DIAGNOSIS — I1310 Hypertensive heart and chronic kidney disease without heart failure, with stage 1 through stage 4 chronic kidney disease, or unspecified chronic kidney disease: Secondary | ICD-10-CM

## 2017-11-09 DIAGNOSIS — J069 Acute upper respiratory infection, unspecified: Secondary | ICD-10-CM | POA: Diagnosis not present

## 2017-11-09 LAB — CBC AND DIFFERENTIAL
HEMATOCRIT: 43 (ref 41–53)
HEMOGLOBIN: 14.3 (ref 13.5–17.5)
PLATELETS: 138 — AB (ref 150–399)
WBC: 6.7

## 2017-11-09 LAB — HEPATIC FUNCTION PANEL
ALT: 18 (ref 10–40)
AST: 21 (ref 14–40)
Alkaline Phosphatase: 42 (ref 25–125)
BILIRUBIN, TOTAL: 0.5

## 2017-11-09 LAB — BASIC METABOLIC PANEL
BUN: 36 — AB (ref 4–21)
Creatinine: 1.2 (ref 0.6–1.3)
GLUCOSE: 101
Potassium: 4.4 (ref 3.4–5.3)
Sodium: 139 (ref 137–147)

## 2017-11-09 NOTE — Progress Notes (Signed)
Location:  [place] Room Number: N 34 Place of Service:  SNF (31) Provider: Ellerie Arenz FNP-C  [name], MD  Patient Care Team: [name], MD as PCP - General (Internal Medicine) [name], MD as Consulting Physician (Urology) [name], MD as Consulting Physician (Neurology) [name], MD as Consulting Physician (Dermatology) [name], MD (Inactive) as Consulting Physician (Gastroenterology) [name], MD as Consulting Physician (Orthopedic Surgery) [name], MD as Consulting Physician (Cardiology) [name], MD as Consulting Physician (Ophthalmology) [name], MD as Referring Physician (Psychiatry) [name], MD as Consulting Physician (General Surgery) [name], MD as Consulting Physician (Orthopedic Surgery) [place], [name], NP as Nurse Practitioner (Family Medicine)  Extended Emergency Contact Information Primary Emergency Contact: Giannotti,[name] Address: [address]          [place], [place] [address] [place] Phone: [phone] Relation: Spouse  Code Status:  DNR Goals of care: Advanced Directive information Advanced Directives 11/09/2017  Does Patient Have a Medical Advance Directive? Yes  Type of Paramedic of [place];Living will;Out of facility DNR (pink MOST or yellow form)  Does patient want to make changes to medical advance directive? -  Copy of [place] in Chart? Yes  Pre-existing out of facility DNR order (yellow form or pink MOST form) Yellow form placed in chart (order not valid for inpatient use);Pink MOST form placed in chart (order not valid for inpatient use)     Chief Complaint  Patient presents with  . Acute Visit    resident is being seen due to elevated blood pressure    HPI:  Pt is a 84 y.o. male seen today at [facility] for an acute visit for  evaluation of high blood pressure.He is seen in his room today with daughter,wife and facility Nurse present at bedside.Nurse reports patient complained of sore throat and not feeling well.Temp 99.1 and B/p 230/108.He states sore throat started few minutes ago.He was doing well since morning.Nurse states patient was hot to touch.He denies cough,chills,headache,dizziness,nausea,vomiting,chest pain or shortness of breath.He complains of sore throat and runny nose.Nurse checked Blood pressure on both arms SBP in the 200's.Encouraged to check B/P with manual cuff.   Past Medical History:  Diagnosis Date  . Acute urinary retention 01/28/2013  . AKI (acute kidney injury) ([place]) 01/26/2013  . Altered mental status 09/09/2013  . Anemia    B12 deficiency  . ANXIETY 06/07/2007   Qualifier: Diagnosis of  By: Talbert Cage CMA ([place]), June    . ARTHRITIS 06/07/2007   Qualifier: Diagnosis of  By: Talbert Cage CMA ([place]), June    . Balance problems 04/05/2011  . Benign essential tremor   . Benign fibroma of prostate 10/11/2011  . Bilateral inguinal hernia 10/09/2012  . Bilateral leg edema 09/13/2012  . Bladder retention 02/05/2013  . Bradycardia, sinus 08/02/2012  . CAROTID ARTERY DISEASE 03/31/2007   Qualifier: Diagnosis of  By: Linna Darner MD, Gwyndolyn Saxon    . Carotid stenosis    s/p L CEA  . Cataract, nuclear 03/21/2014  . 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 Cervical spinal stenosis   . Cervical stenosis of spine   . Critical lower limb ischemia   . Degeneration macular 11/02/2011  . Depression    Dr Albertine Patricia  . Difficulty in walking(719.7) 04/05/2011  . ESOPHAGITIS 08/06/2002   Qualifier: Diagnosis of  By: Talbert Cage CMA (  AAMA), June    . Essential and other specified forms of tremor 04/18/2013  . Fall at home 01/06/2017   left hip pain  . Falls   . Family history of colon cancer 07/24/2012  . Fecal impaction ([place]) 10/11/2012  . Femoral hernia, bilateral s/p lap repair 01/16/2013 01/16/2013  . Glaucoma, compensated 03/21/2014    . H/O cardiovascular stress test    a. 02/2003 -> no ischemia/infarct.  Marland Kitchen HAMMER TOE 04/23/2010   Qualifier: Diagnosis of  By: Linna Darner MD, Gwyndolyn Saxon    . Hematoma of leg   . Hip hematoma, right 09/09/2013  . History of shingles 2009  . HTN (hypertension) 08/22/2012  . Hx of echocardiogram    Echocardiogram 08/01/12: Mild LVH, EF 55-60%, normal wall motion.  . Hydrocele 10/11/2011  . Hyperlipidemia   . 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 Hypothyroidism   . Macular degeneration disease   . Macular edema   . Migraines    Dr Jannifer Franklin  . MORTON'S NEUROMA, LEFT 08/22/2007   Qualifier: Diagnosis of  By: Linna Darner MD, Gwyndolyn Saxon    . PAD (peripheral artery disease) ([place])   . Peripheral neuropathy   . Personal history of colonic polyps 01/05/2013   2014 2 mm adenomatous polyp   . PREMATURE ATRIAL CONTRACTIONS 03/14/2006   Qualifier: Diagnosis of  By: Linna Darner MD, Gwendolyn Lima 12/16/2011  . SPINAL STENOSIS 03/14/2006   Qualifier: Diagnosis of  By: Linna Darner MD, William   Cervical spine    . Testosterone deficiency    Dr Lawerance Bach, [facility]  . Unspecified vitamin D deficiency 08/06/2012  . Urge incontinence 10/09/2012  . Urinary tract infection, site not specified 02/13/2013  . Weakness 08/02/2012   Past Surgical History:  Procedure Laterality Date  . APPENDECTOMY  1941  . BREAST CYST EXCISION Left 01/16/2013   Procedure: MASS EXCISION AXILLA;  Surgeon: Adin Hector, MD;  Location: [facility];  Service: General;  Laterality: Left;  . CAROTID ENDARTERECTOMY  2005    L  . CATARACT EXTRACTION Right   . COLONOSCOPY  2014   Dr. Deatra Ina  . EYE SURGERY Right 02/2012   retina  . INGUINAL HERNIA REPAIR Bilateral 01/16/2013   Procedure: LAPAROSCOPIC BILATERAL INGUINAL DIRECT AND INDIRECT, FEMORAL, AND OBTURATOR HERNIAS;  Surgeon: Adin Hector, MD;  Location: [facility];  Service: General;  Laterality: Bilateral;  . INSERTION OF MESH N/A 01/16/2013   Procedure: INSERTION OF MESH;  Surgeon: Adin Hector, MD;   Location: [facility];  Service: General;  Laterality: N/A;  . LUMBAR LAMINECTOMY    . TONSILLECTOMY AND ADENOIDECTOMY    . TOTAL HIP ARTHROPLASTY  2005   L    Allergies  Allergen Reactions  . Latex Rash  . Tape Rash    Paper tape is ok to use     Outpatient Encounter Medications as of 11/09/2017  Medication Sig  . acetaminophen (TYLENOL) 500 MG tablet Take 1,000 mg by mouth 3 (three) times daily.   . Amino Acids-Protein Hydrolys (FEEDING SUPPLEMENT, PRO-STAT SUGAR FREE 64,) LIQD Take 30 mLs by mouth daily.  Marland Kitchen amLODipine (NORVASC) 5 MG tablet Take 5 mg by mouth daily.  Marland Kitchen aspirin 81 MG tablet Take 81 mg by mouth every morning.   Marland Kitchen atorvastatin (LIPITOR) 10 MG tablet Take 10 mg by mouth daily.  . B Complex-C (B-COMPLEX WITH VITAMIN C) tablet Take 1 tablet by mouth daily.  . bimatoprost (LUMIGAN) 0.01 % SOLN Place 1 drop into both eyes at  bedtime.  Marland Kitchen buPROPion (WELLBUTRIN XL) 300 MG 24 hr tablet Take 300 mg by mouth daily.  . Cholecalciferol (VITAMIN D3) 1000 units CAPS Take 2,000 Units by mouth daily.   . fluticasone (CUTIVATE) 0.05 % cream Apply 1 application topically 2 (two) times daily as needed (irritation).   Marland Kitchen ketoconazole (NIZORAL) 2 % cream Apply 1 application topically daily as needed for irritation.   Javier Docker Oil 500 MG CAPS Take 500 mg by mouth every morning.   Marland Kitchen levothyroxine (SYNTHROID, LEVOTHROID) 50 MCG tablet Take 50 mcg by mouth daily before breakfast. Take one tablet daily except on Sunday's take 1/2 tablet.  . linaclotide (LINZESS) 145 MCG CAPS capsule Take 1 capsule (145 mcg total) by mouth daily before breakfast.  . Melatonin 3 MG TABS Take 1 tablet by mouth at bedtime.  . mirtazapine (REMERON) 30 MG tablet Take 1 tablet (30 mg total) by mouth at bedtime.  . Multiple Vitamins-Minerals (ICAPS PO) Take 1 tablet by mouth daily.  Vladimir Faster Glycol-Propyl Glycol (SYSTANE) 0.4-0.3 % SOLN Apply 1 drop to eye 4 (four) times daily.  . polyethylene glycol (MIRALAX / GLYCOLAX)  packet Take 17 g by mouth daily.   . sertraline (ZOLOFT) 100 MG tablet Take 200 mg by mouth every morning.   . tamsulosin (FLOMAX) 0.4 MG CAPS capsule TAKE 1 CAPSULE (0.4 MG TOTAL) BY MOUTH DAILY.  Marland Kitchen topiramate (TOPAMAX) 25 MG tablet TAKE 1 TABLET BY MOUTH EVERY MORNING AND 2 TABLETS BY MOUTH EVERY EVENING  . zinc oxide 20 % ointment Apply 1 application topically as needed.    No facility-administered encounter medications on file as of 11/09/2017.     Review of Systems  Constitutional: Negative for appetite change, chills, fatigue and fever.  HENT: Positive for hearing loss, rhinorrhea and sore throat. Negative for congestion, postnasal drip, sinus pressure, sinus pain, sneezing and trouble swallowing.   Eyes: Positive for visual disturbance. Negative for discharge, redness and itching.       Left eye blind.wears eye glasses   Respiratory: Negative for cough, chest tightness, shortness of breath and wheezing.   Cardiovascular: Negative for chest pain, palpitations and leg swelling.  Gastrointestinal: Negative for abdominal distention, abdominal pain, constipation, diarrhea, nausea and vomiting.       LBM 11/08/2017   Endocrine: Negative for cold intolerance, heat intolerance, polydipsia, polyphagia and polyuria.  Genitourinary: Negative for dysuria, flank pain, frequency and urgency.  Musculoskeletal: Positive for gait problem.  Skin: Negative for color change, pallor, rash and wound.  Neurological: Negative for dizziness, facial asymmetry, light-headedness and headaches.  Psychiatric/Behavioral: Negative for agitation, confusion and sleep disturbance. The patient is not nervous/anxious.     Immunization History  Administered Date(s) Administered  . DTaP 11/05/2005  . Hepatitis B 1932/07/23  . Influenza Split 11/12/2011, 11/29/2013  . Influenza Whole 10/30/2007, 11/12/2008, 11/26/2009  . Influenza, High Dose Seasonal PF 12/31/2015, 11/24/2016  . Influenza,inj,quad, With Preservative  10/16/2016  . PPD Test 06/23/2011  . Pneumococcal Conjugate-13 01/14/2016  . Pneumococcal Polysaccharide-23 02/16/1999  . Tdap 01/13/2017  . Varicella 11/09/2005  . Zoster 08/30/2006   Pertinent  Health Maintenance Due  Topic Date Due  . INFLUENZA VACCINE  09/15/2017  . PNA vac Low Risk Adult  Completed   Fall Risk  10/28/2017 10/27/2016 06/09/2016 02/03/2016 10/28/2015  Falls in the past year? Yes Yes Yes Yes Yes  Number falls in past yr: 1 2 or more 2 or more 2 or more 2 or more  Comment - - -  2 falls in last 2 months -  Injury with Fall? Yes Yes No Yes No  Comment - L hip - scraps and bruises -  Risk Factor Category  - - - - -  Risk for fall due to : - - - - Impaired balance/gait  Follow up - - - - -   Functional Status Survey:    Vitals:   11/09/17 1414  BP: (!) 230/108  Pulse: 72  Resp: 18  Temp: 99.1 F (37.3 C)  TempSrc: Oral  SpO2: 96%  Weight: 151 lb 12.8 oz (68.9 kg)  Height: 6' (1.829 m)   Body mass index is 20.59 kg/m. Physical Exam  Constitutional: He is oriented to person, place, and time.  Tall Thin built elderly in no acute distress   HENT:  Head: Normocephalic.  Mouth/Throat: Oropharynx is clear and moist. No oropharyngeal exudate.  Eyes: Conjunctivae are normal. Right eye exhibits no discharge. Left eye exhibits no discharge. No scleral icterus.  Right pupil round reactive to light.left eye blind.   Neck: Normal range of motion. No JVD present. No thyromegaly present.  Cardiovascular: Normal rate, regular rhythm, normal heart sounds and intact distal pulses. Exam reveals no gallop and no friction rub.  No murmur heard. Pulmonary/Chest: Effort normal and breath sounds normal. No respiratory distress. He has no wheezes. He has no rales.  Abdominal: Soft. Bowel sounds are normal. He exhibits no distension and no mass. There is no tenderness. There is no rebound and no guarding.  Musculoskeletal: He exhibits no edema or tenderness.  Moves x 4  extremities unsteady gait ambulates with FWW  Lymphadenopathy:    He has no cervical adenopathy.  Neurological: He is oriented to person, place, and time. Gait abnormal.  Skin: Skin is warm and dry. No rash noted. No erythema. No pallor.  Psychiatric: He has a normal mood and affect. His speech is normal and behavior is normal. Judgment and thought content normal.  Nursing note and vitals reviewed.   Labs reviewed: Recent Labs    01/08/17 1356 01/10/17 1528 01/11/17 0532 01/24/17 04/14/17 09/05/17  NA 140 140 141 140 142  142 142  K 4.1 4.8 4.1 4.4 4.4  4.4 4.4  CL 111 112* 111  --   --   --   CO2 23 21* 25  --   --   --   GLUCOSE 95 87 92  --   --   --   BUN 35* 30* 28* 28* 34*  --   CREATININE 1.26* 1.13 1.07 1.2 1.3  1.26 1.29  CALCIUM 8.7* 8.8* 8.3*  --  8.8 8.5   Recent Labs    01/11/17 0532  04/14/17 09/05/17  AST 16   < > 15  15 15   ALT 15*   < > 15  15 14   ALKPHOS 38   < > 40  40 40  BILITOT 0.7  --  0.4 39.0  0.4  PROT 5.3*  --  5.4 5.5  ALBUMIN 2.8*  --  3.3 3.4   < > = values in this interval not displayed.   Recent Labs    01/08/17 1356 01/10/17 1528 01/11/17 0532 01/24/17 04/14/17 09/13/17  WBC 6.6 6.9 6.3 6.3 5.7  5.7 5.0  NEUTROABS 4.6 5.3  --   --   --  3,235  HGB 12.4* 12.4* 11.9* 13.4* 14.0  14.0 15.5  HCT 38.6* 39.3 38.1* 42 42  42.1 48  MCV 96.5 97.3 96.2  --   --   --  PLT 141* 152 143* 239 134* 144*   Lab Results  Component Value Date   TSH 2.43 10/03/2017   Lab Results  Component Value Date   HGBA1C 5.3 07/31/2012   Lab Results  Component Value Date   CHOL 143 10/03/2017   HDL 41 10/03/2017   LDLCALC 74 05/16/2017   LDLCALC 74 05/16/2017   LDLDIRECT 132.5 09/08/2012   TRIG 86 10/03/2017   CHOLHDL 2.7 11/15/2016    Significant Diagnostic Results in last 30 days:  No results found.  Assessment/Plan  1. Hypertensive heart and CKD stage 3   Elevated Blood pressure in the 200's/100.Asymptomatic.Administer clonidine  0.1 mg tablet x 1 dose now.Recheck blood pressure in one hour.Increased Amlodipine from 5 mg tablet to 10 mg tablet daily.Give first dose now.check manual blood pressure.check vital signs every 4 hours x 24 hours.obtain Stat CBC/diff and CMP.obtain 12 lead EKG.  2.Upper Respiratory tract infection   Temp 99.1 with sore throat and runny nose.Oralpharynx exam findings negative.clear Nasal drainage noted.Encouraged fluid intake.Loratadine 10 mg tablet daily first dose now.continue on Tylenol for pain /fever. Vital signs every 4 hours x 24 hours.   3. Low grade fever Temp 99.1 continue on Tylenol.check Vital signs every 4 hour x 24 hours.monitor temp curve.CBC/diff stat.   Family/ staff Communication: Reviewed plan of care with patient and facility Nurse   Labs/tests ordered: Stat CBC/diff and CMP.12 lead EKG Sandrea Hughs, NP

## 2017-11-21 ENCOUNTER — Non-Acute Institutional Stay (SKILLED_NURSING_FACILITY): Payer: Medicare Other | Admitting: Family

## 2017-11-21 ENCOUNTER — Encounter: Payer: Self-pay | Admitting: Family

## 2017-11-21 DIAGNOSIS — H1032 Unspecified acute conjunctivitis, left eye: Secondary | ICD-10-CM

## 2017-11-21 NOTE — Progress Notes (Signed)
Location:  [place] Room Number: 34 Place of Service:  SNF [phone]) Provider: Dinah [name] FNP-C  [name], MD  Patient Care Team: [name], MD as PCP - General (Internal Medicine) [name], MD as Consulting Physician (Urology) [name], MD as Consulting Physician (Neurology) [name], MD as Consulting Physician (Dermatology) [name], MD (Inactive) as Consulting Physician (Gastroenterology) [name], MD as Consulting Physician (Orthopedic Surgery) [name], MD as Consulting Physician (Cardiology) [name], MD as Consulting Physician (Ophthalmology) [name], MD as Referring Physician (Psychiatry) [name], MD as Consulting Physician (General Surgery) [name], MD as Consulting Physician (Orthopedic Surgery) [name], [name], NP as Nurse Practitioner (Family Medicine)  Extended Emergency Contact Information Primary Emergency Contact: Celmer,[name] Address: [address]          [place], [place] [address] [place] Phone: [phone] Relation: Spouse  Code Status:  DNR Goals of care: Advanced Directive information Advanced Directives 11/09/2017  Does Patient Have a Medical Advance Directive? Yes  Type of Paramedic of [place];Living will;Out of facility DNR (pink MOST or yellow form)  Does patient want to make changes to medical advance directive? -  Copy of [place] in Chart? Yes  Pre-existing out of facility DNR order (yellow form or pink MOST form) Yellow form placed in chart (order not valid for inpatient use);Pink MOST form placed in chart (order not valid for inpatient use)     Chief Complaint  Patient presents with  . Acute Visit    Yellow drainage from (L) eye    HPI:  Pt is a 84 y.o. 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   Past Medical History:  Diagnosis Date  . Acute urinary retention 01/28/2013  . AKI (acute kidney injury) ([place]) 01/26/2013  . Altered mental status 09/09/2013  . Anemia    B12 deficiency  . ANXIETY 06/07/2007   Qualifier: Diagnosis of  By: Talbert Cage CMA ([place]), June    . ARTHRITIS 06/07/2007   Qualifier: Diagnosis of  By: Talbert Cage CMA ([place]), June    . Balance problems 04/05/2011  . Benign essential tremor   . Benign fibroma of prostate 10/11/2011  . Bilateral inguinal hernia 10/09/2012  . Bilateral leg edema 09/13/2012  . Bladder retention 02/05/2013  . Bradycardia, sinus 08/02/2012  . CAROTID ARTERY DISEASE 03/31/2007   Qualifier: Diagnosis of  By: Linna Darner MD, Gwyndolyn Saxon    . Carotid stenosis    s/p L CEA  . Cataract, nuclear 03/21/2014  . 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 Cervical spinal stenosis   . Cervical stenosis of spine   . Critical lower limb ischemia   . Degeneration macular 11/02/2011  . Depression    Dr Albertine Patricia  . Difficulty in walking(719.7) 04/05/2011  . ESOPHAGITIS 08/06/2002   Qualifier: Diagnosis of  By: Talbert Cage CMA ([place]), June    . Essential and other specified forms of tremor  04/18/2013  . Fall at home 01/06/2017   left hip pain  . Falls   . Family history of colon cancer 07/24/2012  . Fecal impaction ([place]) 10/11/2012  . Femoral hernia, bilateral s/p lap repair 01/16/2013 01/16/2013  . Glaucoma, compensated 03/21/2014  . H/O cardiovascular stress test    a. 02/2003 -> no ischemia/infarct.  Marland Kitchen HAMMER TOE 04/23/2010   Qualifier: Diagnosis of  By: Linna Darner MD,  Gwyndolyn Saxon    . Hematoma of leg   . Hip hematoma, right 09/09/2013  . History of shingles 2009  . HTN (hypertension) 08/22/2012  . Hx of echocardiogram    Echocardiogram 08/01/12: Mild LVH, EF 55-60%, normal wall motion.  . Hydrocele 10/11/2011  . Hyperlipidemia   . 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 Hypothyroidism   . Macular degeneration disease   . Macular edema   . Migraines    Dr Jannifer Franklin  . MORTON'S NEUROMA, LEFT 08/22/2007   Qualifier: Diagnosis of  By: Linna Darner MD, Gwyndolyn Saxon    . PAD (peripheral artery disease) ([place])   . Peripheral neuropathy   . Personal history of colonic polyps 01/05/2013   2014 2 mm adenomatous polyp   . PREMATURE ATRIAL CONTRACTIONS 03/14/2006   Qualifier: Diagnosis of  By: Linna Darner MD, Gwendolyn Lima 12/16/2011  . SPINAL STENOSIS 03/14/2006   Qualifier: Diagnosis of  By: Linna Darner MD, [place]   Cervical spine    . Testosterone deficiency    Dr Lawerance Bach, [facility]  . Unspecified vitamin D deficiency 08/06/2012  . Urge incontinence 10/09/2012  . Urinary tract infection, site not specified 02/13/2013  . Weakness 08/02/2012   Past Surgical History:  Procedure Laterality Date  . APPENDECTOMY  1941  . BREAST CYST EXCISION Left 01/16/2013   Procedure: MASS EXCISION AXILLA;  Surgeon: Adin Hector, MD;  Location: [facility];  Service: General;  Laterality: Left;  . CAROTID ENDARTERECTOMY  2005    L  . CATARACT EXTRACTION Right   . COLONOSCOPY  2014   Dr. Deatra Ina  . EYE SURGERY Right 02/2012   retina  . INGUINAL HERNIA REPAIR Bilateral 01/16/2013   Procedure: LAPAROSCOPIC BILATERAL INGUINAL DIRECT AND INDIRECT, FEMORAL, AND OBTURATOR HERNIAS;  Surgeon: Adin Hector, MD;  Location: [facility];  Service: General;  Laterality: Bilateral;  . INSERTION OF MESH N/A 01/16/2013   Procedure: INSERTION OF MESH;  Surgeon: Adin Hector, MD;  Location: [facility];  Service: General;  Laterality: N/A;  . LUMBAR LAMINECTOMY    . TONSILLECTOMY AND ADENOIDECTOMY    . TOTAL HIP  ARTHROPLASTY  2005   L    Allergies  Allergen Reactions  . Latex Rash  . Tape Rash    Paper tape is ok to use     Outpatient Encounter Medications as of 11/21/2017  Medication Sig  . acetaminophen (TYLENOL) 500 MG tablet Take 1,000 mg by mouth 3 (three) times daily.   . Amino Acids-Protein Hydrolys (FEEDING SUPPLEMENT, PRO-STAT SUGAR FREE 64,) LIQD Take 30 mLs by mouth daily.  Marland Kitchen amLODipine (NORVASC) 5 MG tablet Take 5 mg by mouth daily.  Marland Kitchen aspirin 81 MG tablet Take 81 mg by mouth every morning.   Marland Kitchen atorvastatin (LIPITOR) 10 MG tablet Take 10 mg by mouth daily.  . B Complex-C (B-COMPLEX WITH VITAMIN C) tablet Take 1 tablet by mouth daily.  . bimatoprost (LUMIGAN) 0.01 % SOLN Place 1 drop into both eyes at bedtime.  Marland Kitchen buPROPion (WELLBUTRIN XL) 300 MG 24 hr tablet Take 300 mg  by mouth daily.  . Cholecalciferol (VITAMIN D3) 1000 units CAPS Take 2,000 Units by mouth daily.   Marland Kitchen erythromycin ophthalmic ointment Place 1 application into the left eye every 8 (eight) hours.  . fluticasone (CUTIVATE) 0.05 % cream Apply 1 application topically 2 (two) times daily as needed (irritation).   Marland Kitchen ketoconazole (NIZORAL) 2 % cream Apply 1 application topically daily as needed for irritation.   Javier Docker Oil 500 MG CAPS Take 500 mg by mouth every morning.   Marland Kitchen levothyroxine (SYNTHROID, LEVOTHROID) 50 MCG tablet Take 50 mcg by mouth daily before breakfast. Take one tablet daily except on Sunday's take 1/2 tablet.  . linaclotide (LINZESS) 145 MCG CAPS capsule Take 1 capsule (145 mcg total) by mouth daily before breakfast.  . loratadine (CLARITIN) 10 MG tablet Take 10 mg by mouth daily.  . Melatonin 3 MG TABS Take 1 tablet by mouth at bedtime.  . mirtazapine (REMERON) 30 MG tablet Take 1 tablet (30 mg total) by mouth at bedtime.  . Multiple Vitamins-Minerals (ICAPS PO) Take 1 tablet by mouth daily.  Vladimir Faster Glycol-Propyl Glycol (SYSTANE) 0.4-0.3 % SOLN Apply 1 drop to eye 4 (four) times daily.  .  polyethylene glycol (MIRALAX / GLYCOLAX) packet Take 17 g by mouth daily.   . sertraline (ZOLOFT) 100 MG tablet Take 200 mg by mouth every morning.   . tamsulosin (FLOMAX) 0.4 MG CAPS capsule TAKE 1 CAPSULE (0.4 MG TOTAL) BY MOUTH DAILY.  Marland Kitchen topiramate (TOPAMAX) 25 MG tablet TAKE 1 TABLET BY MOUTH EVERY MORNING AND 2 TABLETS BY MOUTH EVERY EVENING  . [DISCONTINUED] zinc oxide 20 % ointment Apply 1 application topically as needed.    No facility-administered encounter medications on file as of 11/21/2017.     Review of Systems  Constitutional: Negative for chills and fever.  HENT: Positive for hearing loss. Negative for congestion, rhinorrhea, sinus pressure, sinus pain, sneezing and sore throat.   Eyes: Positive for discharge, redness, itching and visual disturbance. Negative for pain.       Left eye blindness.wears eye glasses   Respiratory: Negative for cough, chest tightness, shortness of breath and wheezing.   Cardiovascular: Negative for chest pain, palpitations and leg swelling.  Skin: Negative for color change, pallor and rash.  Neurological: Negative for dizziness, light-headedness and headaches.  Psychiatric/Behavioral: Negative for sleep disturbance.    Immunization History  Administered Date(s) Administered  . DTaP 11/05/2005  . Hepatitis B 06-30-1932  . Influenza Split 11/12/2011, 11/29/2013  . Influenza Whole 10/30/2007, 11/12/2008, 11/26/2009  . Influenza, High Dose Seasonal PF 12/31/2015, 11/24/2016  . Influenza,inj,quad, With Preservative 10/16/2016  . PPD Test 06/23/2011  . Pneumococcal Conjugate-13 01/14/2016  . Pneumococcal Polysaccharide-23 02/16/1999  . Tdap 01/13/2017  . Varicella 11/09/2005  . Zoster 08/30/2006   Pertinent  Health Maintenance Due  Topic Date Due  . INFLUENZA VACCINE  09/15/2017  . PNA vac Low Risk Adult  Completed   Fall Risk  10/28/2017 10/27/2016 06/09/2016 02/03/2016 10/28/2015  Falls in the past year? Yes Yes Yes Yes Yes  Number falls  in past yr: 1 2 or more 2 or more 2 or more 2 or more  Comment - - - 2 falls in last 2 months -  Injury with Fall? Yes Yes No Yes No  Comment - L hip - scraps and bruises -  Risk Factor Category  - - - - -  Risk for fall due to : - - - - Impaired balance/gait  Follow up - - - - -  Vitals:   11/21/17 1354  BP: 132/80  Pulse: 64  Resp: 16  Temp: 97.7 F (36.5 C)  SpO2: 96%  Weight: 151 lb 3.2 oz (68.6 kg)  Height: 6' (1.829 m)   Body mass index is 20.51 kg/m. Physical Exam  Constitutional: He is oriented to person, place, and time.  Tall thin built elderly in no acute distress   HENT:  Head: Normocephalic.  Mouth/Throat: Oropharynx is clear and moist. No oropharyngeal exudate.  Eyes: Right eye exhibits no discharge. Left eye exhibits discharge. Left eye exhibits no hordeolum. Left conjunctiva is injected. Right eye exhibits normal extraocular motion. Right pupil is round and reactive.  Left eye with large amounts of yellow drainage and matted eyelashes.conjuctiva redness noted.    Neck: Neck supple. No thyromegaly present.  Lymphadenopathy:    He has no cervical adenopathy.  Neurological: He is oriented to person, place, and time.  Skin: Skin is warm and dry. No rash noted. No erythema. No pallor.  Psychiatric: He has a normal mood and affect. His speech is normal and behavior is normal.  Nursing note and vitals reviewed.   Labs reviewed: Recent Labs    01/08/17 1356 01/10/17 1528 01/11/17 0532  01/24/17 04/14/17 09/05/17 11/09/17  NA 140 140 141   < > 140 142  142 142 139  K 4.1 4.8 4.1  --  4.4 4.4  4.4 4.4 4.4  CL 111 112* 111  --   --   --   --   --   CO2 23 21* 25  --   --   --   --   --   GLUCOSE 95 87 92  --   --   --   --   --   BUN 35* 30* 28*  --  28* 34*  --  36*  CREATININE 1.26* 1.13 1.07   < > 1.2 1.3  1.26 1.29 1.2  CALCIUM 8.7* 8.8* 8.3*  --   --  8.8 8.5  --    < > = values in this interval not displayed.   Recent Labs    01/11/17 0532   04/14/17 09/05/17 11/09/17  AST 16   < > 15  15 15 21   ALT 15*   < > 15  15 14 18   ALKPHOS 38   < > 40  40 40 42  BILITOT 0.7  --  0.4 39.0  0.4  --   PROT 5.3*  --  5.4 5.5  --   ALBUMIN 2.8*  --  3.3 3.4  --    < > = values in this interval not displayed.   Recent Labs    01/08/17 1356 01/10/17 1528 01/11/17 0532  04/14/17 09/13/17 11/09/17  WBC 6.6 6.9 6.3   < > 5.7  5.7 5.0 6.7  NEUTROABS 4.6 5.3  --   --   --  3,235  --   HGB 12.4* 12.4* 11.9*   < > 14.0  14.0 15.5 14.3  HCT 38.6* 39.3 38.1*   < > 42  42.1 48 43  MCV 96.5 97.3 96.2  --   --   --   --   PLT 141* 152 143*   < > 134* 144* 138*   < > = values in this interval not displayed.   Lab Results  Component Value Date   TSH 2.43 10/03/2017   Lab Results  Component Value Date   HGBA1C 5.3 07/31/2012   Lab Results  Component Value Date   CHOL 143 10/03/2017   HDL 41 10/03/2017   LDLCALC 74 05/16/2017   LDLCALC 74 05/16/2017   LDLDIRECT 132.5 09/08/2012   TRIG 86 10/03/2017   CHOLHDL 2.7 11/15/2016    Significant Diagnostic Results in last 30 days:  No results found.  Assessment/Plan  Acute conjunctivitis of left eye, unspecified acute conjunctivitis type Afebrile.Left eye with large amounts of yellow drainage and matted eyelashes.conjuctiva redness noted.cleanse left eye with warm wet wash clothe every 8 hours then apply Erythromycin 0.5% eye ointment 1 cm ribbon to left conjunctiva every 8 hours x 7 days.encourage to keep eye glasses clean and to avoid touching face/eyes with his hands.continue to monitor.  Family/ staff Communication: Reviewed plan of care with patient and facility Nurse supervisor  Labs/tests ordered: None   Sandrea Hughs, NP

## 2017-11-22 ENCOUNTER — Encounter: Payer: Self-pay | Admitting: Family Medicine

## 2017-11-22 ENCOUNTER — Non-Acute Institutional Stay (SKILLED_NURSING_FACILITY): Payer: Medicare Other | Admitting: Family Medicine

## 2017-11-22 DIAGNOSIS — I739 Peripheral vascular disease, unspecified: Secondary | ICD-10-CM

## 2017-11-22 DIAGNOSIS — E782 Mixed hyperlipidemia: Secondary | ICD-10-CM

## 2017-11-22 DIAGNOSIS — I10 Essential (primary) hypertension: Secondary | ICD-10-CM | POA: Diagnosis not present

## 2017-11-22 DIAGNOSIS — S72002S Fracture of unspecified part of neck of left femur, sequela: Secondary | ICD-10-CM

## 2017-11-22 DIAGNOSIS — E039 Hypothyroidism, unspecified: Secondary | ICD-10-CM

## 2017-11-22 NOTE — Progress Notes (Signed)
Provider:  Alain Honey, MD Location:  [facility] Room Number: T01 Place of Service:  SNF (31)  PCP: Patient Care Team: [name], MD as PCP - General (Internal Medicine) [name], MD as Consulting Physician (Urology) [name], MD as Consulting Physician (Neurology) [name], MD as Consulting Physician (Dermatology) [name], MD (Inactive) as Consulting Physician (Gastroenterology) [name], MD as Consulting Physician (Orthopedic Surgery) [name], MD as Consulting Physician (Cardiology) [name], MD as Consulting Physician (Ophthalmology) [name], MD as Referring Physician (Psychiatry) [name], MD as Consulting Physician (General Surgery) [name], MD as Consulting Physician (Orthopedic Surgery) [employer], [name], NP as Nurse Practitioner (Family Medicine)  Extended Emergency Contact Information Primary Emergency Contact: [facility] Address: [address]          [place], [place] [address] [place] Phone: [phone] Relation: Spouse  Code Status: DNR Goals of Care: Advanced Directive information Advanced Directives 11/22/2017  Does Patient Have a Medical Advance Directive? Yes  Type of Paramedic of [place];Living will;Out of facility DNR (pink MOST or yellow form)  Does patient want to make changes to medical advance directive? No - Patient declined  Copy of [place] in Chart? Yes  Pre-existing out of facility DNR order (yellow form or pink MOST form) Yellow form placed in chart (order not valid for inpatient use);Pink MOST form placed in chart (order not valid for inpatient use)      Chief Complaint  Patient presents with  . Medical Management of Chronic Issues    Routine Visit    HPI: Patient is a 84 y.o. male seen today for comes including attention hypothyroidism peripheral  arterial disease patient has lived in skilled nursing since he suffered a fall with fractured hip.  His wife lives in independent living.  He does not seem to be very optimistic about going back to independent living but seems content to stay in skilled care.  He ambulates with walker and wheelchair.  Denies any recent pain.  Tight is fair.  There is been no recent significant weight loss.  Endorses sleeping okay. Also has a recurring infection is treated by ophthalmology  Past Medical History:  Diagnosis Date  . Acute urinary retention 01/28/2013  . AKI (acute kidney injury) ([place]) 01/26/2013  . Altered mental status 09/09/2013  . Anemia    B12 deficiency  . ANXIETY 06/07/2007   Qualifier: Diagnosis of  By: Talbert Cage CMA ([place]), June    . ARTHRITIS 06/07/2007   Qualifier: Diagnosis of  By: Talbert Cage CMA ([place]), June    . Balance problems 04/05/2011  . Benign essential tremor   . Benign fibroma of prostate 10/11/2011  . Bilateral inguinal hernia 10/09/2012  . Bilateral leg edema 09/13/2012  . Bladder retention 02/05/2013  . Bradycardia, sinus 08/02/2012  . CAROTID ARTERY DISEASE 03/31/2007   Qualifier: Diagnosis of  By: Linna Darner MD, Gwyndolyn Saxon    . Carotid stenosis    s/p L CEA  . Cataract, nuclear 03/21/2014  . 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 Cervical spinal stenosis   . Cervical stenosis of spine   . Critical lower limb ischemia   . Degeneration macular 11/02/2011  . Depression    Dr Albertine Patricia  . Difficulty in walking(719.7) 04/05/2011  . ESOPHAGITIS 08/06/2002   Qualifier: Diagnosis of  By: Talbert Cage CMA ([place]), June    .  Essential and other specified forms of tremor 04/18/2013  . Fall at home 01/06/2017   left hip pain  . Falls   . Family history of colon cancer 07/24/2012  . Fecal impaction ([place]) 10/11/2012  . Femoral hernia, bilateral s/p lap repair 01/16/2013 01/16/2013  . Glaucoma, compensated 03/21/2014  . H/O cardiovascular stress test    a. 02/2003 -> no ischemia/infarct.  Marland Kitchen HAMMER TOE  04/23/2010   Qualifier: Diagnosis of  By: Linna Darner MD, Gwyndolyn Saxon    . Hematoma of leg   . Hip hematoma, right 09/09/2013  . History of shingles 2009  . HTN (hypertension) 08/22/2012  . Hx of echocardiogram    Echocardiogram 08/01/12: Mild LVH, EF 55-60%, normal wall motion.  . Hydrocele 10/11/2011  . Hyperlipidemia   . 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 Hypothyroidism   . Macular degeneration disease   . Macular edema   . Migraines    Dr Jannifer Franklin  . MORTON'S NEUROMA, LEFT 08/22/2007   Qualifier: Diagnosis of  By: Linna Darner MD, Gwyndolyn Saxon    . PAD (peripheral artery disease) ([place])   . Peripheral neuropathy   . Personal history of colonic polyps 01/05/2013   2014 2 mm adenomatous polyp   . PREMATURE ATRIAL CONTRACTIONS 03/14/2006   Qualifier: Diagnosis of  By: Linna Darner MD, Gwendolyn Lima 12/16/2011  . SPINAL STENOSIS 03/14/2006   Qualifier: Diagnosis of  By: Linna Darner MD, [place]   Cervical spine    . Testosterone deficiency    Dr Lawerance Bach, [facility]  . Unspecified vitamin D deficiency 08/06/2012  . Urge incontinence 10/09/2012  . Urinary tract infection, site not specified 02/13/2013  . Weakness 08/02/2012   Past Surgical History:  Procedure Laterality Date  . APPENDECTOMY  1941  . BREAST CYST EXCISION Left 01/16/2013   Procedure: MASS EXCISION AXILLA;  Surgeon: Adin Hector, MD;  Location: [facility];  Service: General;  Laterality: Left;  . CAROTID ENDARTERECTOMY  2005    L  . CATARACT EXTRACTION Right   . COLONOSCOPY  2014   Dr. Deatra Ina  . EYE SURGERY Right 02/2012   retina  . INGUINAL HERNIA REPAIR Bilateral 01/16/2013   Procedure: LAPAROSCOPIC BILATERAL INGUINAL DIRECT AND INDIRECT, FEMORAL, AND OBTURATOR HERNIAS;  Surgeon: Adin Hector, MD;  Location: [facility];  Service: General;  Laterality: Bilateral;  . INSERTION OF MESH N/A 01/16/2013   Procedure: INSERTION OF MESH;  Surgeon: Adin Hector, MD;  Location: [facility];  Service: General;  Laterality: N/A;  . LUMBAR LAMINECTOMY    .  TONSILLECTOMY AND ADENOIDECTOMY    . TOTAL HIP ARTHROPLASTY  2005   L    reports that he quit smoking about 38 years ago. His smoking use included pipe and cigars. He quit after 10.00 years of use. He has never used smokeless tobacco. He reports that he does not drink alcohol or use drugs. Social History   Socioeconomic History  . Marital status: Married    Spouse name: [name]  . Number of children: 2  . Years of education: Not on file  . Highest education level: Not on file  Occupational History  . Occupation: retired     Fish farm manager: RETIRED  Social Needs  . Financial resource strain: Not hard at all  . Food insecurity:    Worry: Never true    Inability: Never true  . Transportation needs:    Medical: No    Non-medical: No  Tobacco Use  . Smoking status: Former Smoker  Years: 10.00    Types: Pipe, Cigars    Last attempt to quit: 02/16/1979    Years since quitting: 38.7  . Smokeless tobacco: Never Used  . Tobacco comment: Quit about age 83   Substance and Sexual Activity  . Alcohol use: No    Alcohol/week: 4.0 standard drinks    Types: 4 Standard drinks or equivalent per week  . Drug use: No  . Sexual activity: Never  Lifestyle  . Physical activity:    Days per week: 2 days    Minutes per session: 60 min  . Stress: Not at all  Relationships  . Social connections:    Talks on phone: More than three times a week    Gets together: More than three times a week    Attends religious service: Never    Active member of club or organization: No    Attends meetings of clubs or organizations: Never    Relationship status: Married  . Intimate partner violence:    Fear of current or ex partner: No    Emotionally abused: No    Physically abused: No    Forced sexual activity: No  Other Topics Concern  . Not on file  Social History Narrative   L[facility] at [facility] since 2013   Married - [name]   Former smoker -stopped 1981   Alcohol -wine occasionally    Exercise -  walking, Ne-step    POA, Living Will   [employer] with walker    Functional Status Survey:    Family History  Problem Relation Age of Onset  . Stroke Mother 32  . Hypertension Mother   . Diabetes Mother   . Heart disease Mother   . Rectal cancer Father 75  . Heart disease Father        in 6s  . Cancer Father        colorectal  . Nephritis Sister   . Seizures Brother        died as child    Health Maintenance  Topic Date Due  . INFLUENZA VACCINE  09/15/2017  . TETANUS/TDAP  01/14/2027  . PNA vac Low Risk Adult  Completed    Allergies  Allergen Reactions  . Latex Rash  . Tape Rash    Paper tape is ok to use     Outpatient Encounter Medications as of 11/22/2017  Medication Sig  . acetaminophen (TYLENOL) 500 MG tablet Take 1,000 mg by mouth 3 (three) times daily.   Marland Kitchen amLODipine (NORVASC) 10 MG tablet Take 10 mg by mouth daily.  Marland Kitchen aspirin 81 MG tablet Take 81 mg by mouth every morning.   Marland Kitchen atorvastatin (LIPITOR) 10 MG tablet Take 10 mg by mouth daily.  . B Complex-C (B-COMPLEX WITH VITAMIN C) tablet Take 1 tablet by mouth daily.  . bimatoprost (LUMIGAN) 0.01 % SOLN Place 1 drop into both eyes at bedtime.  Marland Kitchen buPROPion (WELLBUTRIN XL) 300 MG 24 hr tablet Take 300 mg by mouth daily. per psychiatry recommendation  . Cholecalciferol (VITAMIN D3) 1000 units CAPS Take 2,000 Units by mouth daily.   Marland Kitchen erythromycin ophthalmic ointment Place 1 application into the left eye every 8 (eight) hours. apply 1 cm ribbon to left conjunctiva sac every 8 hours x 7 days for conjunctivitis.  . fluticasone (CUTIVATE) 0.05 % cream Apply 1 application topically 2 (two) times daily as needed (irritation).   Marland Kitchen ketoconazole (NIZORAL) 2 % cream Apply 1 application topically daily as needed for irritation.   Marland Kitchen  Krill Oil 500 MG CAPS Take 500 mg by mouth every morning.   Marland Kitchen levothyroxine (SYNTHROID, LEVOTHROID) 50 MCG tablet Take 50 mcg by mouth daily before breakfast. Take one tablet daily except on  Sunday's take 1/2 tablet.  . linaclotide (LINZESS) 145 MCG CAPS capsule Take 1 capsule (145 mcg total) by mouth daily before breakfast.  . loratadine (CLARITIN) 10 MG tablet Take 10 mg by mouth daily.  . Melatonin 3 MG TABS Take 1 tablet by mouth at bedtime.  . mirtazapine (REMERON) 30 MG tablet Take 60 mg by mouth at bedtime.  . Multiple Vitamins-Minerals (ICAPS PO) Take 1 tablet by mouth daily.  Vladimir Faster Glycol-Propyl Glycol (SYSTANE) 0.4-0.3 % SOLN Apply 1 drop to eye 4 (four) times daily.  . polyethylene glycol (MIRALAX / GLYCOLAX) packet Take 17 g by mouth daily. Hold for loose stool  . sertraline (ZOLOFT) 100 MG tablet Take 200 mg by mouth every morning.   . tamsulosin (FLOMAX) 0.4 MG CAPS capsule TAKE 1 CAPSULE (0.4 MG TOTAL) BY MOUTH DAILY.  Marland Kitchen topiramate (TOPAMAX) 25 MG tablet TAKE 1 TABLET BY MOUTH EVERY MORNING AND 2 TABLETS BY MOUTH EVERY EVENING  . [DISCONTINUED] Amino Acids-Protein Hydrolys (FEEDING SUPPLEMENT, PRO-STAT SUGAR FREE 64,) LIQD Take 30 mLs by mouth daily.  . [DISCONTINUED] amLODipine (NORVASC) 5 MG tablet Take 5 mg by mouth daily.  . [DISCONTINUED] erythromycin ophthalmic ointment Place 1 application into the left eye every 8 (eight) hours.  . [DISCONTINUED] mirtazapine (REMERON) 30 MG tablet Take 1 tablet (30 mg total) by mouth at bedtime.   No facility-administered encounter medications on file as of 11/22/2017.     Review of Systems  Constitutional: Negative.   HENT: Negative.   Eyes: Positive for discharge.  Respiratory: Negative.   Cardiovascular: Negative.   Gastrointestinal: Negative.   Endocrine: Negative.   Genitourinary: Negative.   Neurological: Negative.   Psychiatric/Behavioral: Negative.     Vitals:   11/22/17 0858  BP: 132/80  Pulse: 64  Resp: 16  Temp: 97.7 F (36.5 C)  TempSrc: Oral  SpO2: 96%  Weight: 151 lb 3.2 oz (68.6 kg)  Height: 6' (1.829 m)   Body mass index is 20.51 kg/m. Physical Exam  Constitutional: He is oriented  to person, place, and time. He appears well-developed and well-nourished.  HENT:  Head: Normocephalic and atraumatic.  Nose: Nose normal.  Mouth/Throat: Oropharynx is clear and moist.  Eyes: Pupils are equal, round, and reactive to light.  Purulent discharge left eye  Neck: Normal range of motion. Neck supple.  Cardiovascular: Normal rate, regular rhythm and normal heart sounds.  Pulmonary/Chest: Effort normal and breath sounds normal.  Abdominal: Soft. Bowel sounds are normal.  Musculoskeletal:  Patient is seated in a wheelchair does say that he uses walker short distances  Neurological: He is alert and oriented to person, place, and time.  Skin: Skin is warm.  Psychiatric: He has a normal mood and affect. His behavior is normal. Thought content normal.  Nursing note and vitals reviewed.   Labs reviewed: Basic Metabolic Panel: Recent Labs    01/08/17 1356 01/10/17 1528 01/11/17 0532  01/24/17 04/14/17 09/05/17 11/09/17  NA 140 140 141   < > 140 142  142 142 139  K 4.1 4.8 4.1  --  4.4 4.4  4.4 4.4 4.4  CL 111 112* 111  --   --   --   --   --   CO2 23 21* 25  --   --   --   --   --  GLUCOSE 95 87 92  --   --   --   --   --   BUN 35* 30* 28*  --  28* 34*  --  36*  CREATININE 1.26* 1.13 1.07   < > 1.2 1.3  1.26 1.29 1.2  CALCIUM 8.7* 8.8* 8.3*  --   --  8.8 8.5  --    < > = values in this interval not displayed.   Liver Function Tests: Recent Labs    01/11/17 0532  04/14/17 09/05/17 11/09/17  AST 16   < > 15  15 15 21   ALT 15*   < > 15  15 14 18   ALKPHOS 38   < > 40  40 40 42  BILITOT 0.7  --  0.4 39.0  0.4  --   PROT 5.3*  --  5.4 5.5  --   ALBUMIN 2.8*  --  3.3 3.4  --    < > = values in this interval not displayed.   No results for input(s): LIPASE, AMYLASE in the last 8760 hours. No results for input(s): AMMONIA in the last 8760 hours. CBC: Recent Labs    01/08/17 1356 01/10/17 1528 01/11/17 0532  04/14/17 09/13/17 11/09/17  WBC 6.6 6.9 6.3   < > 5.7   5.7 5.0 6.7  NEUTROABS 4.6 5.3  --   --   --  3,235  --   HGB 12.4* 12.4* 11.9*   < > 14.0  14.0 15.5 14.3  HCT 38.6* 39.3 38.1*   < > 42  42.1 48 43  MCV 96.5 97.3 96.2  --   --   --   --   PLT 141* 152 143*   < > 134* 144* 138*   < > = values in this interval not displayed.   Cardiac Enzymes: No results for input(s): CKTOTAL, CKMB, CKMBINDEX, TROPONINI in the last 8760 hours. BNP: Invalid input(s): POCBNP Lab Results  Component Value Date   HGBA1C 5.3 07/31/2012   Lab Results  Component Value Date   TSH 2.43 10/03/2017   Lab Results  Component Value Date   [id number] 124 08/02/2012   No results found for: FOLATE No results found for: IRON, TIBC, FERRITIN  Imaging and Procedures obtained prior to SNF admission: Dg Hip Unilat With Pelvis 2-3 Views Left  Result Date: 02/11/2017 CLINICAL DATA:  Left hip pain with limited weight-bearing and walking. Frequent falls over the last month. History of left total hip arthroplasty. EXAM: DG HIP (WITH OR WITHOUT PELVIS) 2-3V LEFT COMPARISON:  Radiographs 01/08/2017 and 08/09/2016. FINDINGS: Status post left total hip arthroplasty with a screw fixed acetabular component. No evidence of hardware loosening, acute fracture or dislocation. There is stable mild heterotopic ossification surrounding the proximal left femur. Moderate right hip osteoarthritis and lower lumbar spondylosis noted. IMPRESSION: Stable appearance of the left hip status post total arthroplasty. Right hip osteoarthritis. Electronically Signed   By: Richardean Sale M.D.   On: 02/11/2017 08:19    Assessment/Plan 1. PAD (peripheral artery disease) ([place]) Patient is status post carotid endarterectomy.  There are no recent symptoms suggesting carotid disease such as TIA.  2. Essential hypertension Blood pressures are well controlled  3. Acquired hypothyroidism Patient takes thyroid supplement.  Most recent TSH in August of this year shows that it is in the therapeutic  range.  4. Hip fracture, left, sequela Continues to use wheelchair and walker for ambulation.  I do not believe he is currently receiving any  therapy  5. HYPERLIPIDEMIA LDL last checked in August was less than 100 on 10 mg of atorvastatin.  He appears to be tolerating this well without any myalgias or issues with his liver, so plan to continue same   Family/ staff Communication: Discussed findings patients with patient who demonstrates understanding  Labs/tests ordered:  Lillette Boxer. Sabra Heck, [place] [address] [place], [place] Office [phone]

## 2017-12-21 ENCOUNTER — Non-Acute Institutional Stay (SKILLED_NURSING_FACILITY): Payer: Medicare Other | Admitting: Family

## 2017-12-21 ENCOUNTER — Encounter: Payer: Self-pay | Admitting: Family

## 2017-12-21 DIAGNOSIS — E039 Hypothyroidism, unspecified: Secondary | ICD-10-CM | POA: Diagnosis not present

## 2017-12-21 DIAGNOSIS — F329 Major depressive disorder, single episode, unspecified: Secondary | ICD-10-CM | POA: Diagnosis not present

## 2017-12-21 DIAGNOSIS — I1310 Hypertensive heart and chronic kidney disease without heart failure, with stage 1 through stage 4 chronic kidney disease, or unspecified chronic kidney disease: Secondary | ICD-10-CM | POA: Diagnosis not present

## 2017-12-21 DIAGNOSIS — E782 Mixed hyperlipidemia: Secondary | ICD-10-CM

## 2017-12-21 DIAGNOSIS — R609 Edema, unspecified: Secondary | ICD-10-CM

## 2017-12-25 DIAGNOSIS — I1310 Hypertensive heart and chronic kidney disease without heart failure, with stage 1 through stage 4 chronic kidney disease, or unspecified chronic kidney disease: Secondary | ICD-10-CM | POA: Insufficient documentation

## 2017-12-25 DIAGNOSIS — G8194 Hemiplegia, unspecified affecting left nondominant side: Secondary | ICD-10-CM | POA: Insufficient documentation

## 2017-12-25 NOTE — ACP (Advance Care Planning) (Signed)
Advance Goals of care Reviewed advance goals of care with Patient, HCPOA Patient's wife,daughter and Facility Social worker present during this conversation. Patient chart reviewed has current form DNR and MOST.Patient and HCPOA would like to reconsider and void DNR form and change to full code in case of a cardiopulmonary event.Patient's DNR form voided.Current MOST form also voided.Changed DNR to full code.He would like to continue previous chosen additional  Intervention: If patient has pulse/or breathing he would like full scope of treatment including use of intubation,advance airway,mechanical ventilation,cardioversion if indicated,medical treatment I.V fluids transfers to the hospital and also provide comfort measures.Also desires antibiotics use to be determined or limitation when infections occur.He would like I.V fluids and feeding tube for defined trial period. MOST form has been filled out today. Reviewed goals of care and filled MOST form between 14:00-14:35 pm. Answered question/ concern from patient, daughter/HCPOA to the best of my knowledge.

## 2017-12-25 NOTE — Progress Notes (Addendum)
Location:  [facility] Room Number: N82 Place of Service:  SNF (31) Provider: Mykale Gandolfo FNP-C   Maryl Blalock, [name], NP  Jose Leach Care Team: Tiras Bianchini, [name], NP as PCP - General (Family Medicine) [name], MD as Consulting Physician (Urology) [name], MD as Consulting Physician (Neurology) [name], MD as Consulting Physician (Dermatology) [name], MD (Inactive) as Consulting Physician (Gastroenterology) [name], MD as Consulting Physician (Orthopedic Surgery) [name], MD as Consulting Physician (Cardiology) [name], MD as Consulting Physician (Ophthalmology) [name], MD as Referring Physician (Psychiatry) [name], MD as Consulting Physician (General Surgery) [name], MD as Consulting Physician (Orthopedic Surgery)  Extended Emergency Contact Information Primary Emergency Contact: Amyx,[name] Address: [address]          [place], [place] [address] [place] Phone: [phone] Relation: Spouse  Code Status: Full Code  Goals of care: Advanced Directive information Advanced Directives 12/21/2017  Does Jose Leach Have a Medical Advance Directive? Yes  Type of Paramedic of [place];Living will;Out of facility DNR (pink MOST or yellow form)  Does Jose Leach want to make changes to medical advance directive? No - Jose Leach declined  Copy of [place] in Chart? Yes - validated most recent copy scanned in chart (See row information)  Pre-existing out of facility DNR order (yellow form or pink MOST form) Yellow form placed in chart (order not valid for inpatient use);Pink MOST form placed in chart (order not valid for inpatient use)     Chief Complaint  Jose Leach presents with  . Medical Management of Chronic Issues    Routine Visit  . Form Completion    Most Form    HPI:  Pt is a 84 y.o. male seen today  [place] for medical management of chronic diseases.He has a medical history of HTN,Hypothyroidism,CAD,PAD,CKD stage 3,migraine headache,BPH,AMD among other conditions.He is seen today with Jose Leach, daughter and facility Social worker present.He denies any acute issues during visit.Jose Leach's daughter states Jose Leach complained of right leg pain previous day.Jose Leach states pain went away after Nurse gave him tylenol.He has had no pain today.His weight has been stable over one month.He states his appetite has been good.He continues to walking using his walker on facility [facility].No fall episodes reported.HCPOA and Jose Leach would like to discuss Jose Leach's advance care planning goals.Jose Leach's daughter states would like to make changes to previous  filled form.He currently has a DNR and MOST form.Jose Leach and HCPOA wishes to void DNR form.In case of a cardiopulmonary event Jose Leach would like CPR to be done.Current MOST form also voided and changes made to include CPR however Jose Leach/HCPOA want other medical interventions on MOST remain as previously filled.  Past Medical History:  Diagnosis Date  . Acute urinary retention 01/28/2013  . AKI (acute kidney injury) ([place]) 01/26/2013  . Altered mental status 09/09/2013  . Anemia    B12 deficiency  . ANXIETY 06/07/2007   Qualifier: Diagnosis of  By: Talbert Cage CMA ([place]), June    . ARTHRITIS 06/07/2007   Qualifier: Diagnosis of  By: Talbert Cage CMA ([place]), June    . Balance problems 04/05/2011  . Benign essential tremor   . Benign fibroma of prostate 10/11/2011  . Bilateral inguinal hernia 10/09/2012  . Bilateral leg edema 09/13/2012  . Bladder retention 02/05/2013  . Bradycardia, sinus 08/02/2012  . CAROTID ARTERY DISEASE 03/31/2007   Qualifier: Diagnosis  of  By: Linna Darner MD, Gwyndolyn Saxon    . Carotid stenosis    s/p L CEA  . Cataract, nuclear 03/21/2014  . 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 Cervical spinal stenosis   . Cervical stenosis of spine   . Critical lower  limb ischemia   . Degeneration macular 11/02/2011  . Depression    Dr Albertine Patricia  . Difficulty in walking(719.7) 04/05/2011  . ESOPHAGITIS 08/06/2002   Qualifier: Diagnosis of  By: Talbert Cage CMA ([place]), June    . Essential and other specified forms of tremor 04/18/2013  . Fall at home 01/06/2017   left hip pain  . Falls   . Family history of colon cancer 07/24/2012  . Fecal impaction ([place]) 10/11/2012  . Femoral hernia, bilateral s/p lap repair 01/16/2013 01/16/2013  . Glaucoma, compensated 03/21/2014  . H/O cardiovascular stress test    a. 02/2003 -> no ischemia/infarct.  Marland Kitchen HAMMER TOE 04/23/2010   Qualifier: Diagnosis of  By: Linna Darner MD, Gwyndolyn Saxon    . Hematoma of leg   . Hip hematoma, right 09/09/2013  . History of shingles 2009  . HTN (hypertension) 08/22/2012  . Hx of echocardiogram    Echocardiogram 08/01/12: Mild LVH, EF 55-60%, normal wall motion.  . Hydrocele 10/11/2011  . Hyperlipidemia   . 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 Hypothyroidism   . Macular degeneration disease   . Macular edema   . Migraines    Dr Jannifer Franklin  . MORTON'S NEUROMA, LEFT 08/22/2007   Qualifier: Diagnosis of  By: Linna Darner MD, Gwyndolyn Saxon    . PAD (peripheral artery disease) ([place])   . Peripheral neuropathy   . Personal history of colonic polyps 01/05/2013   2014 2 mm adenomatous polyp   . PREMATURE ATRIAL CONTRACTIONS 03/14/2006   Qualifier: Diagnosis of  By: Linna Darner MD, Gwendolyn Lima 12/16/2011  . SPINAL STENOSIS 03/14/2006   Qualifier: Diagnosis of  By: Linna Darner MD, [place]   Cervical spine    . Testosterone deficiency    Dr Lawerance Bach, [facility]  . Unspecified vitamin D deficiency 08/06/2012  . Urge incontinence 10/09/2012  . Urinary tract infection, site not specified 02/13/2013  . Weakness 08/02/2012   Past Surgical History:  Procedure Laterality Date  . APPENDECTOMY  1941  . BREAST CYST EXCISION Left 01/16/2013   Procedure: MASS EXCISION AXILLA;  Surgeon: Adin Hector, MD;  Location: [facility];  Service: General;   Laterality: Left;  . CAROTID ENDARTERECTOMY  2005    L  . CATARACT EXTRACTION Right   . COLONOSCOPY  2014   Dr. Deatra Ina  . EYE SURGERY Right 02/2012   retina  . INGUINAL HERNIA REPAIR Bilateral 01/16/2013   Procedure: LAPAROSCOPIC BILATERAL INGUINAL DIRECT AND INDIRECT, FEMORAL, AND OBTURATOR HERNIAS;  Surgeon: Adin Hector, MD;  Location: [facility];  Service: General;  Laterality: Bilateral;  . INSERTION OF MESH N/A 01/16/2013   Procedure: INSERTION OF MESH;  Surgeon: Adin Hector, MD;  Location: [facility];  Service: General;  Laterality: N/A;  . LUMBAR LAMINECTOMY    . TONSILLECTOMY AND ADENOIDECTOMY    . TOTAL HIP ARTHROPLASTY  2005   L    Allergies  Allergen Reactions  . Latex Rash  . Tape Rash    Paper tape is ok to use     Allergies as of 12/21/2017      Reactions   Latex Rash   Tape Rash   Paper tape is ok to use      Medication List  Accurate as of 12/21/17 11:59 PM. Always use your most recent med list.          acetaminophen 500 MG tablet Commonly known as:  TYLENOL Take 1,000 mg by mouth 3 (three) times daily.   amLODipine 10 MG tablet Commonly known as:  NORVASC Take 10 mg by mouth daily.   aspirin 81 MG tablet Take 81 mg by mouth every morning.   atorvastatin 10 MG tablet Commonly known as:  LIPITOR Take 10 mg by mouth daily.   B-complex with vitamin C tablet Take 1 tablet by mouth daily.   bimatoprost 0.01 % Soln Commonly known as:  LUMIGAN Place 1 drop into both eyes at bedtime.   buPROPion 300 MG 24 hr tablet Commonly known as:  WELLBUTRIN XL Take 300 mg by mouth daily. per psychiatry recommendation   fluticasone 0.05 % cream Commonly known as:  CUTIVATE Apply 1 application topically 2 (two) times daily as needed (irritation).   ICAPS PO Take 1 tablet by mouth daily.   ketoconazole 2 % cream Commonly known as:  NIZORAL Apply 1 application topically daily as needed for irritation.   Krill Oil 500 MG Caps Take 500 mg by mouth  every morning.   levothyroxine 50 MCG tablet Commonly known as:  SYNTHROID, LEVOTHROID Take 50 mcg by mouth daily before breakfast. Take one tablet daily except on Sunday's take 1/2 tablet.   linaclotide 145 MCG Caps capsule Commonly known as:  LINZESS Take 1 capsule (145 mcg total) by mouth daily before breakfast.   loratadine 10 MG tablet Commonly known as:  CLARITIN Take 10 mg by mouth daily.   Melatonin 3 MG Tabs Take 1 tablet by mouth at bedtime.   mirtazapine 30 MG tablet Commonly known as:  REMERON Take 60 mg by mouth at bedtime.   polyethylene glycol packet Commonly known as:  MIRALAX / GLYCOLAX Take 17 g by mouth daily. Hold for loose stool   sertraline 100 MG tablet Commonly known as:  ZOLOFT Take 200 mg by mouth every morning.   SYSTANE 0.4-0.3 % Soln Generic drug:  Polyethyl Glycol-Propyl Glycol Apply 1 drop to eye 4 (four) times daily.   tamsulosin 0.4 MG Caps capsule Commonly known as:  FLOMAX TAKE 1 CAPSULE (0.4 MG TOTAL) BY MOUTH DAILY.   topiramate 25 MG tablet Commonly known as:  TOPAMAX TAKE 1 TABLET BY MOUTH EVERY MORNING AND 2 TABLETS BY MOUTH EVERY EVENING   Vitamin D3 25 MCG (1000 UT) Caps Take 2,000 Units by mouth daily.       Review of Systems  Constitutional: Negative for appetite change, chills, fatigue, fever and unexpected weight change.  HENT: Negative for congestion, rhinorrhea, sinus pressure, sinus pain, sneezing and sore throat.   Eyes: Positive for visual disturbance. Negative for pain, discharge and redness.       Wears eye glasses   Respiratory: Negative for cough, chest tightness, shortness of breath and wheezing.   Cardiovascular: Positive for leg swelling. Negative for chest pain and palpitations.  Gastrointestinal: Negative for abdominal distention, abdominal pain, constipation, diarrhea, nausea and vomiting.  Endocrine: Negative for cold intolerance, heat intolerance, polydipsia, polyphagia and polyuria.  Genitourinary:  Negative for dysuria, flank pain and urgency.  Musculoskeletal: Positive for arthralgias and gait problem.  Skin: Negative for color change, pallor, rash and wound.  Neurological: Negative for dizziness, light-headedness and headaches.  Hematological: Does not bruise/bleed easily.  Psychiatric/Behavioral: Negative for agitation, confusion and sleep disturbance. The Jose Leach is not nervous/anxious.  Immunization History  Administered Date(s) Administered  . DTaP 11/05/2005  . Hepatitis B 12/10/1932  . Influenza Split 11/12/2011, 11/29/2013  . Influenza Whole 10/30/2007, 11/12/2008, 11/26/2009  . Influenza, High Dose Seasonal PF 12/31/2015, 11/24/2016  . Influenza,inj,quad, With Preservative 10/16/2016  . Influenza-Unspecified 11/28/2017  . PPD Test 06/23/2011  . Pneumococcal Conjugate-13 01/14/2016  . Pneumococcal Polysaccharide-23 02/16/1999  . Tdap 01/13/2017  . Varicella 11/09/2005  . Zoster 08/30/2006   Pertinent  Health Maintenance Due  Topic Date Due  . INFLUENZA VACCINE  Completed  . PNA vac Low Risk Adult  Completed   Fall Risk  10/28/2017 10/27/2016 06/09/2016 02/03/2016 10/28/2015  Falls in the past year? Yes Yes Yes Yes Yes  Number falls in past yr: 1 2 or more 2 or more 2 or more 2 or more  Comment - - - 2 falls in last 2 months -  Injury with Fall? Yes Yes No Yes No  Comment - L hip - scraps and bruises -  Risk Factor Category  - - - - -  Risk for fall due to : - - - - Impaired balance/gait  Follow up - - - - -    Vitals:   12/21/17 1336  BP: 119/67  Pulse: 61  Resp: 18  Temp: (!) 96.5 F (35.8 C)  TempSrc: Oral  SpO2: 96%  Weight: 158 lb 1.6 oz (71.7 kg)  Height: 6' (1.829 m)   Body mass index is 21.44 kg/m. Physical Exam  Constitutional: He is oriented to person, place, and time.  Tall thin built normal weight,elderly in no acute distress   HENT:  Head: Normocephalic.  Right Ear: External ear normal.  Left Ear: External ear normal.   Mouth/Throat: Oropharynx is clear and moist. No oropharyngeal exudate.  Eyes: Conjunctivae are normal. Right eye exhibits no discharge. Left eye exhibits no discharge. No scleral icterus.  Left eye blindness.Right pupil round and reactive to light  Neck: Normal range of motion. No JVD present. No thyromegaly present.  Cardiovascular: Normal rate, regular rhythm, normal heart sounds and intact distal pulses. Exam reveals no gallop and no friction rub.  No murmur heard. Pulmonary/Chest: Effort normal and breath sounds normal. No respiratory distress. He has no wheezes. He has no rales.  Abdominal: Soft. Bowel sounds are normal. He exhibits no distension and no mass. There is no tenderness. There is no rebound and no guarding.  Musculoskeletal: He exhibits no tenderness.  Moves x 4 extremities.unsteady gait ambulates with front wheel walker.bilateral lower extremities 1+ edema.   Lymphadenopathy:    He has no cervical adenopathy.  Neurological: He is oriented to person, place, and time. Gait abnormal.  Skin: Skin is warm and dry. No rash noted. No erythema. No pallor.  Psychiatric: He has a normal mood and affect. His behavior is normal. Judgment and thought content normal.  Difficulty finding words at times not new problem for Jose Leach.  Nursing note and vitals reviewed.   Labs reviewed: Recent Labs    01/08/17 1356 01/10/17 1528 01/11/17 0532  01/24/17 04/14/17 09/05/17 11/09/17  NA 140 140 141   < > 140 142  142 142 139  K 4.1 4.8 4.1  --  4.4 4.4  4.4 4.4 4.4  CL 111 112* 111  --   --   --   --   --   CO2 23 21* 25  --   --   --   --   --   GLUCOSE 95 87 92  --   --   --   --   --  BUN 35* 30* 28*  --  28* 34*  --  36*  CREATININE 1.26* 1.13 1.07   < > 1.2 1.3  1.26 1.29 1.2  CALCIUM 8.7* 8.8* 8.3*  --   --  8.8 8.5  --    < > = values in this interval not displayed.   Recent Labs    01/11/17 0532  04/14/17 09/05/17 11/09/17  AST 16   < > 15  15 15 21   ALT 15*   < > 15   15 14 18   ALKPHOS 38   < > 40  40 40 42  BILITOT 0.7  --  0.4 39.0  0.4  --   PROT 5.3*  --  5.4 5.5  --   ALBUMIN 2.8*  --  3.3 3.4  --    < > = values in this interval not displayed.   Recent Labs    01/08/17 1356 01/10/17 1528 01/11/17 0532  04/14/17 09/13/17 11/09/17  WBC 6.6 6.9 6.3   < > 5.7  5.7 5.0 6.7  NEUTROABS 4.6 5.3  --   --   --  3,235  --   HGB 12.4* 12.4* 11.9*   < > 14.0  14.0 15.5 14.3  HCT 38.6* 39.3 38.1*   < > 42  42.1 48 43  MCV 96.5 97.3 96.2  --   --   --   --   PLT 141* 152 143*   < > 134* 144* 138*   < > = values in this interval not displayed.   Lab Results  Component Value Date   TSH 2.43 10/03/2017   Lab Results  Component Value Date   HGBA1C 5.3 07/31/2012   Lab Results  Component Value Date   CHOL 143 10/03/2017   HDL 41 10/03/2017   LDLCALC 74 05/16/2017   LDLCALC 74 05/16/2017   LDLDIRECT 132.5 09/08/2012   TRIG 86 10/03/2017   CHOLHDL 2.7 11/15/2016    Significant Diagnostic Results in last 30 days:  No results found.  Assessment/Plan  1.  Advance Care Planning   Last ACP Note 09/26/2017 to 12/25/2017       ACP (Advance Care Planning) by Sandrea Hughs, NP at 12/21/2017  1:34 PM    Date of Service   Author Author Type Status Note Type File Time  12/21/2017 Addend Zair Borawski, [name], NP Nurse Practitioner Signed ACP (Advance Care Planning) 12/25/2017             Advance Goals of care Reviewed advance goals of care with Jose Leach, HCPOA Jose Leach's Jose Leach,daughter and Facility Social worker present during this conversation. Jose Leach chart reviewed has current form DNR and MOST.Jose Leach and HCPOA would like to reconsider and void DNR form and change to full code in case of a cardiopulmonary event.Jose Leach's DNR form voided.Current MOST form also voided.Changed DNR to full code.He would like to continue previous chosen additional  Intervention: If Jose Leach has pulse/or breathing he would like full scope of treatment including use of  intubation,advance airway,mechanical ventilation,cardioversion if indicated,medical treatment I.V fluids transfers to the hospital and also provide comfort measures.Also desires antibiotics use to be determined or limitation when infections occur.He would like I.V fluids and feeding tube for defined trial period. MOST form has been filled out today. Reviewed goals of care and filled MOST form between 14:00-14:35 pm. Answered question/ concern from Jose Leach, daughter/HCPOA to the best of my knowledge.              2.  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 Acquired hypothyroidism Lab Results  Component Value Date   TSH 2.43 10/03/2017  Continue on levothyroxine 50 mcg tablet daily before breakfast.   4. HYPERLIPIDEMIA LDL at goal.continue on atorvastatin 10 mg tablet daily.monitor lipid panel.   5. Major depression, chronic Mood stable.continue to follow up with Psychiatry service.sertraline 200 mg tablet daily,Remeron 60 mg tablet at bedtime and Wellbutrin  300 mg tablet daily managed by Psychiatry service.continue to monitor for mood changes.  6. Edema  Bilateral lower extremities 1+ edema.No cough,shortness of breath wheezing,rales or abrupt weight gain noted.Apply knee high ted hose on in the morning and off at bedtime.continue to monitor weight.   Family/ staff Communication: Reviewed plan of care with Jose Leach,Jose Leach,daughter,facility Social worker and Nurse   Labs/tests ordered: None   Sandrea Hughs, NP

## 2018-01-05 LAB — LIPID PANEL
Cholesterol: 176 (ref 0–200)
HDL: 45 (ref 35–70)
LDL CALC: 112
TRIGLYCERIDES: 93 (ref 40–160)

## 2018-01-06 ENCOUNTER — Ambulatory Visit: Payer: Medicare Other | Admitting: Neurology

## 2018-01-09 NOTE — Progress Notes (Signed)
11/21 

## 2018-01-23 ENCOUNTER — Encounter: Payer: Self-pay | Admitting: Family

## 2018-01-23 ENCOUNTER — Non-Acute Institutional Stay (SKILLED_NURSING_FACILITY): Payer: Medicare Other | Admitting: Family

## 2018-01-23 DIAGNOSIS — G4700 Insomnia, unspecified: Secondary | ICD-10-CM

## 2018-01-23 DIAGNOSIS — I1310 Hypertensive heart and chronic kidney disease without heart failure, with stage 1 through stage 4 chronic kidney disease, or unspecified chronic kidney disease: Secondary | ICD-10-CM | POA: Diagnosis not present

## 2018-01-23 DIAGNOSIS — F329 Major depressive disorder, single episode, unspecified: Secondary | ICD-10-CM

## 2018-01-23 DIAGNOSIS — E782 Mixed hyperlipidemia: Secondary | ICD-10-CM

## 2018-01-23 DIAGNOSIS — E039 Hypothyroidism, unspecified: Secondary | ICD-10-CM | POA: Diagnosis not present

## 2018-01-23 NOTE — Progress Notes (Signed)
Location:  [facility] Room Number: X90 Place of Service:  SNF (31) Provider: Melecio Cueto FNP-C   Cricket Goodlin, [name], NP  Patient Care Team: Eula Jaster, [name], NP as PCP - General (Family Medicine) [name], MD as Consulting Physician (Urology) [name], MD as Consulting Physician (Neurology) [name], MD as Consulting Physician (Dermatology) [name], MD (Inactive) as Consulting Physician (Gastroenterology) [name], MD as Consulting Physician (Orthopedic Surgery) [name], MD as Consulting Physician (Cardiology) [name], MD as Consulting Physician (Ophthalmology) [name], MD as Referring Physician (Psychiatry) [name], MD as Consulting Physician (General Surgery) [name], MD as Consulting Physician (Orthopedic Surgery)  Extended Emergency Contact Information Primary Emergency Contact: Dimmitt,[name] Address: [address]          [place], [place] [address] [place] Phone: [phone] Relation: Spouse  Code Status:  Full Code  Goals of care: Advanced Directive information Advanced Directives 01/23/2018  Does Patient Have a Medical Advance Directive? Yes  Type of Paramedic of [place];Out of facility DNR (pink MOST or yellow form)  Does patient want to make changes to medical advance directive? No - Patient declined  Copy of [place] in Chart? Yes - validated most recent copy scanned in chart (See row information)  Pre-existing out of facility DNR order (yellow form or pink MOST form) Yellow form placed in chart (order not valid for inpatient use);Pink MOST form placed in chart (order not valid for inpatient use)     Chief Complaint  Patient presents with  . Medical Management of Chronic Issues    HPI:  Pt is a 84 y.o. male seen today [facility] for medical management of chronic diseases.He  has a medical history of HTN,Hyperlipidemia,PAD,BPH,CKD stage 2,Hypothyroidism,major depression managed by Psychiatrist,OA among other conditions.He seen in his room today.Facility Nurse reports patient fell on the floor>He states had spilled water on his bed so he decided to sit on his recliner but used the recliner chair remote wrong way which made him slid to the floor.He denies any acute pain or hitting head on the floor.    Past Medical History:  Diagnosis Date  . Acute urinary retention 01/28/2013  . AKI (acute kidney injury) ([place]) 01/26/2013  . Altered mental status 09/09/2013  . Anemia    B12 deficiency  . ANXIETY 06/07/2007   Qualifier: Diagnosis of  By: Talbert Cage CMA ([place]), June    . ARTHRITIS 06/07/2007   Qualifier: Diagnosis of  By: Talbert Cage CMA ([place]), June    . Balance problems 04/05/2011  . Benign essential tremor   . Benign fibroma of prostate 10/11/2011  . Bilateral inguinal hernia 10/09/2012  . Bilateral leg edema 09/13/2012  . Bladder retention 02/05/2013  . Bradycardia, sinus 08/02/2012  . CAROTID ARTERY DISEASE 03/31/2007   Qualifier: Diagnosis of  By: Linna Darner MD, Gwyndolyn Saxon    . Carotid stenosis    s/p L CEA  . Cataract, nuclear 03/21/2014  . 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 Cervical spinal stenosis   . Cervical stenosis of spine   . Critical lower limb ischemia   . Degeneration macular 11/02/2011  . Depression    Dr Albertine Patricia  . Difficulty in walking(719.7) 04/05/2011  . ESOPHAGITIS 08/06/2002   Qualifier: Diagnosis of  By: Talbert Cage CMA ([place]), June    . Essential and other specified forms of tremor 04/18/2013  .  Fall at home 01/06/2017   left hip pain  . Falls   . Family history of colon cancer 07/24/2012  . Fecal impaction ([place]) 10/11/2012  . Femoral hernia, bilateral s/p lap repair 01/16/2013 01/16/2013  . Glaucoma, compensated 03/21/2014  . H/O cardiovascular stress test    a. 02/2003 -> no ischemia/infarct.  Marland Kitchen HAMMER TOE 04/23/2010   Qualifier: Diagnosis of  By: Linna Darner  MD, Gwyndolyn Saxon    . Hematoma of leg   . Hip hematoma, right 09/09/2013  . History of shingles 2009  . HTN (hypertension) 08/22/2012  . Hx of echocardiogram    Echocardiogram 08/01/12: Mild LVH, EF 55-60%, normal wall motion.  . Hydrocele 10/11/2011  . Hyperlipidemia   . 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 Hypothyroidism   . Macular degeneration disease   . Macular edema   . Migraines    Dr Jannifer Franklin  . MORTON'S NEUROMA, LEFT 08/22/2007   Qualifier: Diagnosis of  By: Linna Darner MD, Gwyndolyn Saxon    . PAD (peripheral artery disease) ([place])   . Peripheral neuropathy   . Personal history of colonic polyps 01/05/2013   2014 2 mm adenomatous polyp   . PREMATURE ATRIAL CONTRACTIONS 03/14/2006   Qualifier: Diagnosis of  By: Linna Darner MD, Gwendolyn Lima 12/16/2011  . SPINAL STENOSIS 03/14/2006   Qualifier: Diagnosis of  By: Linna Darner MD, William   Cervical spine    . Testosterone deficiency    Dr Lawerance Bach, [facility]  . Unspecified vitamin D deficiency 08/06/2012  . Urge incontinence 10/09/2012  . Urinary tract infection, site not specified 02/13/2013  . Weakness 08/02/2012   Past Surgical History:  Procedure Laterality Date  . APPENDECTOMY  1941  . BREAST CYST EXCISION Left 01/16/2013   Procedure: MASS EXCISION AXILLA;  Surgeon: Adin Hector, MD;  Location: [facility];  Service: General;  Laterality: Left;  . CAROTID ENDARTERECTOMY  2005    L  . CATARACT EXTRACTION Right   . COLONOSCOPY  2014   Dr. Deatra Ina  . EYE SURGERY Right 02/2012   retina  . INGUINAL HERNIA REPAIR Bilateral 01/16/2013   Procedure: LAPAROSCOPIC BILATERAL INGUINAL DIRECT AND INDIRECT, FEMORAL, AND OBTURATOR HERNIAS;  Surgeon: Adin Hector, MD;  Location: [facility];  Service: General;  Laterality: Bilateral;  . INSERTION OF MESH N/A 01/16/2013   Procedure: INSERTION OF MESH;  Surgeon: Adin Hector, MD;  Location: [facility];  Service: General;  Laterality: N/A;  . LUMBAR LAMINECTOMY    . TONSILLECTOMY AND ADENOIDECTOMY    . TOTAL HIP  ARTHROPLASTY  2005   L    Allergies  Allergen Reactions  . Latex Rash  . Tape Rash    Paper tape is ok to use     Allergies as of 01/23/2018      Reactions   Latex Rash   Tape Rash   Paper tape is ok to use      Medication List        Accurate as of 01/23/18  4:38 PM. Always use your most recent med list.          acetaminophen 500 MG tablet Commonly known as:  TYLENOL Take 1,000 mg by mouth 3 (three) times daily.   amLODipine 10 MG tablet Commonly known as:  NORVASC Take 10 mg by mouth daily.   aspirin 81 MG tablet Take 81 mg by mouth every morning.   atorvastatin 10 MG tablet Commonly known as:  LIPITOR Take 10 mg by mouth daily.  B-complex with vitamin C tablet Take 1 tablet by mouth daily.   bimatoprost 0.01 % Soln Commonly known as:  LUMIGAN Place 1 drop into both eyes at bedtime.   buPROPion 300 MG 24 hr tablet Commonly known as:  WELLBUTRIN XL Take 300 mg by mouth daily. per psychiatry recommendation   fluticasone 0.05 % cream Commonly known as:  CUTIVATE Apply 1 application topically 2 (two) times daily as needed (irritation).   ICAPS PO Take 1 tablet by mouth daily.   ketoconazole 2 % cream Commonly known as:  NIZORAL Apply 1 application topically daily as needed for irritation.   Krill Oil 500 MG Caps Take 500 mg by mouth every morning.   levothyroxine 50 MCG tablet Commonly known as:  SYNTHROID, LEVOTHROID Take 50 mcg by mouth daily before breakfast. Take one tablet daily except on Sunday's take 1/2 tablet.   linaclotide 145 MCG Caps capsule Commonly known as:  LINZESS Take 1 capsule (145 mcg total) by mouth daily before breakfast.   loratadine 10 MG tablet Commonly known as:  CLARITIN Take 10 mg by mouth daily.   Melatonin 3 MG Tabs Take 1 tablet by mouth at bedtime.   mirtazapine 30 MG tablet Commonly known as:  REMERON Take 60 mg by mouth at bedtime.   polyethylene glycol packet Commonly known as:  MIRALAX /  GLYCOLAX Take 17 g by mouth daily. Hold for loose stool   sertraline 100 MG tablet Commonly known as:  ZOLOFT Take 200 mg by mouth every morning.   SYSTANE 0.4-0.3 % Soln Generic drug:  Polyethyl Glycol-Propyl Glycol Apply 1 drop to eye 4 (four) times daily.   tamsulosin 0.4 MG Caps capsule Commonly known as:  FLOMAX TAKE 1 CAPSULE (0.4 MG TOTAL) BY MOUTH DAILY.   topiramate 25 MG tablet Commonly known as:  TOPAMAX TAKE 1 TABLET BY MOUTH EVERY MORNING AND 2 TABLETS BY MOUTH EVERY EVENING   Vitamin D3 25 MCG (1000 UT) Caps Take 2,000 Units by mouth daily.       Review of Systems  Constitutional: Negative for activity change, appetite change, chills, fatigue, fever and unexpected weight change.  HENT: Negative for congestion, rhinorrhea, sinus pressure, sinus pain, sneezing and sore throat.   Eyes: Positive for visual disturbance. Negative for discharge and redness.       Wears eye glasses  Respiratory: Negative for cough, chest tightness, shortness of breath and wheezing.   Cardiovascular: Negative for chest pain, palpitations and leg swelling.  Gastrointestinal: Negative for abdominal distention, abdominal pain, constipation, diarrhea, nausea and vomiting.  Endocrine: Negative for cold intolerance, heat intolerance, polydipsia, polyphagia and polyuria.  Genitourinary: Negative for dysuria, flank pain, frequency and urgency.  Musculoskeletal: Positive for arthralgias and gait problem.       Chronic left hip pain   Skin: Negative for color change, pallor, rash and wound.  Neurological: Negative for dizziness, light-headedness and headaches.  Hematological: Does not bruise/bleed easily.  Psychiatric/Behavioral: Negative for agitation, confusion and sleep disturbance. The patient is not nervous/anxious.     Immunization History  Administered Date(s) Administered  . DTaP 11/05/2005  . Hepatitis B [birth year]  . Influenza Split 11/12/2011, 11/29/2013  . Influenza Whole  10/30/2007, 11/12/2008, 11/26/2009  . Influenza, High Dose Seasonal PF 12/31/2015, 11/24/2016  . Influenza,inj,quad, With Preservative 10/16/2016  . Influenza-Unspecified 11/28/2017  . PPD Test 06/23/2011  . Pneumococcal Conjugate-13 01/14/2016  . Pneumococcal Polysaccharide-23 02/16/1999  . Tdap 01/13/2017  . Varicella 11/09/2005  . Zoster 08/30/2006   Pertinent  Health Maintenance Due  Topic Date Due  . INFLUENZA VACCINE  Completed  . PNA vac Low Risk Adult  Completed   Fall Risk  10/28/2017 10/27/2016 06/09/2016 02/03/2016 10/28/2015  Falls in the past year? Yes Yes Yes Yes Yes  Number falls in past yr: 1 2 or more 2 or more 2 or more 2 or more  Comment - - - 2 falls in last 2 months -  Injury with Fall? Yes Yes No Yes No  Comment - L hip - scraps and bruises -  Risk Factor Category  - - - - -  Risk for fall due to : - - - - Impaired balance/gait  Follow up - - - - -    Vitals:   01/23/18 1006  BP: (!) 180/82  Pulse: 67  Resp: 18  Temp: (!) 97.1 F (36.2 C)  SpO2: 96%  Weight: 156 lb 14.4 oz (71.2 kg)  Height: 6' (1.829 m)   Body mass index is 21.28 kg/m. Physical Exam Vitals signs and nursing note reviewed.  Constitutional:      Comments: Thin built,elderly in no acute distress   HENT:     Head: Normocephalic.     Right Ear: Tympanic membrane, ear canal and external ear normal. There is no impacted cerumen.     Left Ear: Tympanic membrane, ear canal and external ear normal. There is no impacted cerumen.     Nose: Nose normal. No congestion or rhinorrhea.     Mouth/Throat:     Mouth: Mucous membranes are moist.     Pharynx: Oropharynx is clear. No oropharyngeal exudate or posterior oropharyngeal erythema.  Eyes:     General: No scleral icterus.       Right eye: No discharge.        Left eye: No discharge.     Conjunctiva/sclera: Conjunctivae normal.     Comments: Left eye blindness  Neck:     Musculoskeletal: Normal range of motion. No muscular tenderness.   Cardiovascular:     Rate and Rhythm: Normal rate and regular rhythm.     Pulses: Normal pulses.     Heart sounds: Normal heart sounds. No murmur. No friction rub. No gallop.   Pulmonary:     Effort: Pulmonary effort is normal. No respiratory distress.     Breath sounds: Normal breath sounds. No wheezing, rhonchi or rales.  Chest:     Chest wall: No tenderness.  Abdominal:     General: Abdomen is flat. Bowel sounds are normal. There is no distension.     Palpations: Abdomen is soft. There is no mass.     Tenderness: There is no abdominal tenderness. There is no right CVA tenderness, left CVA tenderness, guarding or rebound.  Musculoskeletal:        General: No swelling or tenderness.     Right lower leg: No edema.     Left lower leg: No edema.     Comments: Moves x 4 extremities without any difficulties except left hip limited ROM due to chronic hip pain   Lymphadenopathy:     Cervical: No cervical adenopathy.  Skin:    General: Skin is warm and dry.     Coloration: Skin is not pale.     Findings: No bruising, erythema or lesion.  Neurological:     Mental Status: He is alert and oriented to person, place, and time.     Gait: Gait abnormal.  Psychiatric:  Mood and Affect: Mood normal.        Speech: Speech normal.        Behavior: Behavior normal.        Thought Content: Thought content normal.        Judgment: Judgment normal.   Labs reviewed: Recent Labs    01/24/17 04/14/17 09/05/17 11/09/17  NA 140 142  142 142 139  K 4.4 4.4  4.4 4.4 4.4  BUN 28* 34*  --  36*  CREATININE 1.2 1.3  1.26 1.29 1.2  CALCIUM  --  8.8 8.5  --    Recent Labs    04/14/17 09/05/17 11/09/17  AST 15  15 15 21   ALT 15  15 14 18   ALKPHOS 40  40 40 42  BILITOT 0.4 39.0  0.4  --   PROT 5.4 5.5  --   ALBUMIN 3.3 3.4  --    Recent Labs    04/14/17 09/13/17 11/09/17  WBC 5.7  5.7 5.0 6.7  NEUTROABS  --  3,235  --   HGB 14.0  14.0 15.5 14.3  HCT 42  42.1 48 43  PLT 134* 144*  138*   Lab Results  Component Value Date   TSH 2.43 10/03/2017   Lab Results  Component Value Date   HGBA1C 5.3 07/31/2012   Lab Results  Component Value Date   CHOL 176 01/05/2018   HDL 45 01/05/2018   LDLCALC 112 01/05/2018   LDLDIRECT 132.5 09/08/2012   TRIG 93 01/05/2018   CHOLHDL 2.7 11/15/2016    Significant Diagnostic Results in last 30 days:  No results found.  Assessment/Plan 1. 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   2. Acquired hypothyroidism Lab Results  Component Value Date   TSH 2.43 10/03/2017  Continue on levothyroxine 50 mcg tablet daily.monitor TSH level. 3. Major depression, chronic Mood stable.medication managed by Psychiatry.continue to follow up.monitor for mood changes.   4. Insomnia, unspecified type Continue melatonin and Remeron.   5. HYPERLIPIDEMIA  Lab Results  Component Value Date   CHOL 176 01/05/2018   HDL 45 01/05/2018   LDLCALC 112 01/05/2018   LDLDIRECT 132.5 09/08/2012   TRIG 93 01/05/2018   CHOLHDL 2.7 11/15/2016  Continue on Lipitor 10 mg tablet daily.Monitor lipid panel.    Family/ staff Communication: Reviewed plan of care with patient and facility Nurse.  Labs/tests ordered: None   Magon Croson C Viet Kemmerer, NP

## 2018-01-26 LAB — HEMOGLOBIN A1C: Hemoglobin A1C: 5

## 2018-03-15 ENCOUNTER — Non-Acute Institutional Stay (SKILLED_NURSING_FACILITY): Payer: Medicare Other | Admitting: Internal Medicine

## 2018-03-15 ENCOUNTER — Encounter: Payer: Self-pay | Admitting: Internal Medicine

## 2018-03-15 DIAGNOSIS — K5909 Other constipation: Secondary | ICD-10-CM | POA: Diagnosis not present

## 2018-03-15 DIAGNOSIS — E039 Hypothyroidism, unspecified: Secondary | ICD-10-CM | POA: Diagnosis not present

## 2018-03-15 DIAGNOSIS — F332 Major depressive disorder, recurrent severe without psychotic features: Secondary | ICD-10-CM

## 2018-03-15 DIAGNOSIS — I1310 Hypertensive heart and chronic kidney disease without heart failure, with stage 1 through stage 4 chronic kidney disease, or unspecified chronic kidney disease: Secondary | ICD-10-CM

## 2018-03-15 NOTE — Progress Notes (Signed)
Location:  [facility] Room Number: 37 Place of Service:  SNF (31) Provider:   Mast, Man X, NP  Patient Care Team: Mast, Man X, NP as PCP - General (Internal Medicine) [name], MD as Consulting Physician (Urology) [name], MD as Consulting Physician (Neurology) [name], MD as Consulting Physician (Dermatology) [name], MD (Inactive) as Consulting Physician (Gastroenterology) [name], MD as Consulting Physician (Orthopedic Surgery) [name], MD as Consulting Physician (Cardiology) [name], MD as Consulting Physician (Ophthalmology) [name], MD as Referring Physician (Psychiatry) [name], MD as Consulting Physician (General Surgery) [name], MD as Consulting Physician (Orthopedic Surgery)  Extended Emergency Contact Information Primary Emergency Contact: Citro,[name] Address: [address]          [place], [place] [address] [place] Phone: [phone] Relation: Spouse  Code Status:  Full code  Goals of care: Advanced Directive information Advanced Directives 03/15/2018  Does Patient Have a Medical Advance Directive? Yes  Type of Advance Directive Out of facility DNR (pink MOST or yellow form);Healthcare Power of Attorney  Does patient want to make changes to medical advance directive? No - Patient declined  Copy of [place] in Chart? Yes - validated most recent copy scanned in chart (See row information)  Pre-existing out of facility DNR order (yellow form or pink MOST form) Yellow form placed in chart (order not valid for inpatient use);Pink MOST form placed in chart (order not valid for inpatient use)     Chief Complaint  Patient presents with  . Acute Visit    reassess sacrum area     HPI:  Pt is a 85 y.o. male seen today for medical management of chronic diseases. And also for Follow up on Sacral Pain. Patient  is Long term Resident of SNF. He has been in this room for more then 1 year.His wife lives in [place]. He has h/o HTN, Hyperlipidemia, PAD, BPH , CKD stage 2, Hypothyroidism, and Major Depression Also Has h/o Cervial Stenosis with Compressive Myelopathy, BPH He walks with the walker but has been mostly using his Wheelchair.  His weight today is 150 lbs which is less then in his Last weight of 156 He was seen by dermatologist few days ago as he was having some pain in Sacral area and he thought he had a rash. They Prescribed Biofine and Nizoral. And to keep pressure off the area. He is also on Chronic Doxycycline for his Eyes per Ophthalmologist. He is followed by Psychiatrist for his Major Depression . He says his Symptoms have been stable. He denied any other Acute problems. No Cough or Fever or Chest pain.     Past Medical History:  Diagnosis Date  . Acute urinary retention 01/28/2013  . AKI (acute kidney injury) ([place]) 01/26/2013  . Altered mental status 09/09/2013  . Anemia    B12 deficiency  . ANXIETY 06/07/2007   Qualifier: Diagnosis of  By: Talbert Cage CMA ([place]), June    . ARTHRITIS 06/07/2007   Qualifier: Diagnosis of  By: Talbert Cage CMA ([place]), June    . Balance problems 04/05/2011  . Benign essential tremor   . Benign fibroma of prostate 10/11/2011  . Bilateral inguinal hernia 10/09/2012  . Bilateral leg edema 09/13/2012  . Bladder retention 02/05/2013  . Bradycardia, sinus 08/02/2012  . CAROTID ARTERY DISEASE 03/31/2007   Qualifier: Diagnosis of  By: Linna Darner MD, William    . Carotid stenosis    s/p L CEA  . Cataract, nuclear 03/21/2014  . 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 Cervical spinal stenosis   . Cervical stenosis of spine   . Critical lower limb ischemia   . Degeneration macular 11/02/2011  . Depression    Dr Albertine Patricia  . Difficulty in walking(719.7) 04/05/2011  . ESOPHAGITIS 08/06/2002   Qualifier: Diagnosis of  By: Talbert Cage CMA ([place]), June    . Essential and other specified  forms of tremor 04/18/2013  . Fall at home 01/06/2017   left hip pain  . Falls   . Family history of colon cancer 07/24/2012  . Fecal impaction ([place]) 10/11/2012  . Femoral hernia, bilateral s/p lap repair 01/16/2013 01/16/2013  . Glaucoma, compensated 03/21/2014  . H/O cardiovascular stress test    a. 02/2003 -> no ischemia/infarct.  Marland Kitchen HAMMER TOE 04/23/2010   Qualifier: Diagnosis of  By: Linna Darner MD, Gwyndolyn Saxon    . Hematoma of leg   . Hip hematoma, right 09/09/2013  . History of shingles 2009  . HTN (hypertension) 08/22/2012  . Hx of echocardiogram    Echocardiogram 08/01/12: Mild LVH, EF 55-60%, normal wall motion.  . Hydrocele 10/11/2011  . Hyperlipidemia   . 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 Hypothyroidism   . Macular degeneration disease   . Macular edema   . Migraines    Dr Jannifer Franklin  . MORTON'S NEUROMA, LEFT 08/22/2007   Qualifier: Diagnosis of  By: Linna Darner MD, Gwyndolyn Saxon    . PAD (peripheral artery disease) ([place])   . Peripheral neuropathy   . Personal history of colonic polyps 01/05/2013   2014 2 mm adenomatous polyp   . PREMATURE ATRIAL CONTRACTIONS 03/14/2006   Qualifier: Diagnosis of  By: Linna Darner MD, Gwendolyn Lima 12/16/2011  . SPINAL STENOSIS 03/14/2006   Qualifier: Diagnosis of  By: Linna Darner MD, William   Cervical spine    . Testosterone deficiency    Dr Lawerance Bach, [facility]  . Unspecified vitamin D deficiency 08/06/2012  . Urge incontinence 10/09/2012  . Urinary tract infection, site not specified 02/13/2013  . Weakness 08/02/2012   Past Surgical History:  Procedure Laterality Date  . APPENDECTOMY  1941  . BREAST CYST EXCISION Left 01/16/2013   Procedure: MASS EXCISION AXILLA;  Surgeon: Adin Hector, MD;  Location: [facility];  Service: General;  Laterality: Left;  . CAROTID ENDARTERECTOMY  2005    L  . CATARACT EXTRACTION Right   . COLONOSCOPY  2014   Dr. Deatra Ina  . EYE SURGERY Right 02/2012   retina  . INGUINAL HERNIA REPAIR Bilateral 01/16/2013   Procedure: LAPAROSCOPIC  BILATERAL INGUINAL DIRECT AND INDIRECT, FEMORAL, AND OBTURATOR HERNIAS;  Surgeon: Adin Hector, MD;  Location: [facility];  Service: General;  Laterality: Bilateral;  . INSERTION OF MESH N/A 01/16/2013   Procedure: INSERTION OF MESH;  Surgeon: Adin Hector, MD;  Location: [facility];  Service: General;  Laterality: N/A;  . LUMBAR LAMINECTOMY    . TONSILLECTOMY AND ADENOIDECTOMY    . TOTAL HIP ARTHROPLASTY  2005   L    Allergies  Allergen Reactions  . Latex Rash  . Tape Rash    Paper tape is ok to use     Outpatient Encounter Medications as of 03/15/2018  Medication Sig  . acetaminophen (TYLENOL) 500 MG tablet Take 1,000 mg by mouth 3 (three) times daily.   Marland Kitchen amLODipine (NORVASC) 10 MG tablet Take 10 mg  by mouth daily.  Marland Kitchen aspirin 81 MG tablet Take 81 mg by mouth every morning.   Marland Kitchen atorvastatin (LIPITOR) 10 MG tablet Take 10 mg by mouth daily.  . B Complex-C (B-COMPLEX WITH VITAMIN C) tablet Take 1 tablet by mouth daily.  . bimatoprost (LUMIGAN) 0.01 % SOLN Place 1 drop into both eyes at bedtime.  Marland Kitchen buPROPion (WELLBUTRIN XL) 300 MG 24 hr tablet Take 300 mg by mouth daily. per psychiatry recommendation  . Cholecalciferol (VITAMIN D3) 1000 units CAPS Take 2,000 Units by mouth daily.   . fluticasone (CUTIVATE) 0.05 % cream Apply 1 application topically 2 (two) times daily as needed (irritation).   Marland Kitchen ketoconazole (NIZORAL) 2 % cream Apply 1 application topically daily as needed for irritation.   Javier Docker Oil 500 MG CAPS Take 500 mg by mouth every morning.   Marland Kitchen levothyroxine (SYNTHROID, LEVOTHROID) 50 MCG tablet Take 50 mcg by mouth daily before breakfast. Take one tablet daily except on Sunday's take 1/2 tablet.  . linaclotide (LINZESS) 145 MCG CAPS capsule Take 1 capsule (145 mcg total) by mouth daily before breakfast.  . loratadine (CLARITIN) 10 MG tablet Take 10 mg by mouth daily.  . Melatonin 3 MG TABS Take 1 tablet by mouth at bedtime.  . mirtazapine (REMERON) 30 MG tablet Take 60 mg by  mouth at bedtime.  . Multiple Vitamins-Minerals (ICAPS PO) Take 1 tablet by mouth daily.  Vladimir Faster Glycol-Propyl Glycol (SYSTANE) 0.4-0.3 % SOLN Apply 1 drop to eye 4 (four) times daily.  . polyethylene glycol (MIRALAX / GLYCOLAX) packet Take 17 g by mouth daily. Hold for loose stool  . sertraline (ZOLOFT) 100 MG tablet Take 200 mg by mouth every morning.   . tamsulosin (FLOMAX) 0.4 MG CAPS capsule TAKE 1 CAPSULE (0.4 MG TOTAL) BY MOUTH DAILY.  Marland Kitchen topiramate (TOPAMAX) 25 MG tablet TAKE 1 TABLET BY MOUTH EVERY MORNING AND 2 TABLETS BY MOUTH EVERY EVENING   No facility-administered encounter medications on file as of 03/15/2018.     Review of Systems  Constitutional: Positive for appetite change.  HENT: Negative.   Respiratory: Negative.   Cardiovascular: Negative.   Gastrointestinal: Negative.   Genitourinary: Negative.   Musculoskeletal: Negative.   Skin: Positive for rash.  Neurological: Negative.   Psychiatric/Behavioral: Negative.   All other systems reviewed and are negative.   Immunization History  Administered Date(s) Administered  . DTaP 11/05/2005  . Hepatitis B 09-28-1932  . Influenza Split 11/12/2011, 11/29/2013  . Influenza Whole 10/30/2007, 11/12/2008, 11/26/2009  . Influenza, High Dose Seasonal PF 12/31/2015, 11/24/2016  . Influenza,inj,quad, With Preservative 10/16/2016  . Influenza-Unspecified 11/28/2017  . PPD Test 06/23/2011  . Pneumococcal Conjugate-13 01/14/2016  . Pneumococcal Polysaccharide-23 02/16/1999  . Tdap 01/13/2017  . Varicella 11/09/2005  . Zoster 08/30/2006   Pertinent  Health Maintenance Due  Topic Date Due  . INFLUENZA VACCINE  Completed  . PNA vac Low Risk Adult  Completed   Fall Risk  10/28/2017 10/27/2016 06/09/2016 02/03/2016 10/28/2015  Falls in the past year? Yes Yes Yes Yes Yes  Number falls in past yr: 1 2 or more 2 or more 2 or more 2 or more  Comment - - - 2 falls in last 2 months -  Injury with Fall? Yes Yes No Yes No    Comment - L hip - scraps and bruises -  Risk Factor Category  - - - - -  Risk for fall due to : - - - - Impaired balance/gait  Follow up - - - - -   Functional Status Survey:    Vitals:   03/15/18 0857  BP: 119/68  Pulse: 61  Temp: (!) 97.4 F (36.3 C)  SpO2: 96%  Weight: 150 lb 12.8 oz (68.4 kg)  Height: 6' (1.829 m)   Body mass index is 20.45 kg/m. Physical Exam Vitals signs reviewed.  Constitutional:      Appearance: Normal appearance.  HENT:     Head: Normocephalic.     Nose: Nose normal.     Mouth/Throat:     Mouth: Mucous membranes are moist.     Pharynx: Oropharynx is clear.  Eyes:     Pupils: Pupils are equal, round, and reactive to light.  Neck:     Musculoskeletal: Neck supple.  Cardiovascular:     Rate and Rhythm: Normal rate and regular rhythm.     Pulses: Normal pulses.     Heart sounds: Normal heart sounds. No murmur.  Pulmonary:     Effort: Pulmonary effort is normal. No respiratory distress.     Breath sounds: Normal breath sounds. No wheezing or rales.  Abdominal:     General: Abdomen is flat. Bowel sounds are normal. There is no distension.     Palpations: Abdomen is soft.     Tenderness: There is no abdominal tenderness. There is no guarding.  Musculoskeletal:        General: No swelling.  Skin:    General: Skin is warm.     Comments: Looked at his sacral area. He has Pressure changes in Sacral area. Mostly just Erythema. No Open Area  Neurological:     General: No focal deficit present.     Mental Status: He is alert and oriented to person, place, and time.     Comments: Has Essential Tremor  Psychiatric:        Mood and Affect: Mood normal.        Thought Content: Thought content normal.     Labs reviewed: Recent Labs    04/14/17 09/05/17 11/09/17  NA 142  142 142 139  K 4.4  4.4 4.4 4.4  BUN 34*  --  36*  CREATININE 1.3  1.26 1.29 1.2  CALCIUM 8.8 8.5  --    Recent Labs    04/14/17 09/05/17 11/09/17  AST 15  15 15 21    ALT 15  15 14 18   ALKPHOS 40  40 40 42  BILITOT 0.4 39.0  0.4  --   PROT 5.4 5.5  --   ALBUMIN 3.3 3.4  --    Recent Labs    04/14/17 09/13/17 11/09/17  WBC 5.7  5.7 5.0 6.7  NEUTROABS  --  3,235  --   HGB 14.0  14.0 15.5 14.3  HCT 42  42.1 48 43  PLT 134* 144* 138*   Lab Results  Component Value Date   TSH 2.43 10/03/2017   Lab Results  Component Value Date   HGBA1C 5 01/26/2018   Lab Results  Component Value Date   CHOL 176 01/05/2018   HDL 45 01/05/2018   LDLCALC 112 01/05/2018   LDLDIRECT 132.5 09/08/2012   TRIG 93 01/05/2018   CHOLHDL 2.7 11/15/2016    Significant Diagnostic Results in last 30 days:  No results found.  Assessment/Plan  Essential Hypertension BP controlled on Norvasc Repeat CMP   Hypothyroidism TSh was normal in 08/19 Contineu Same dose of Synthyroid Sacral Presuure Wound Stage 1  D/W the Nurse and wound care  Notes Reviewed from Dermatology Will discontineu Zinc for now Continue on Biafine and Change Nizoral to Nystatin Gel Cushion for his Chair Also d/w the patient to stay off as much as possible  Chronic constipation On Linzess  Major Depression On Zoloft and Remeron Slight Change in weight will Follow Chronci Conjuctivits On PO Doxycycline per Ophthalmologist Hyperlipidemia LDL is more then 100 and with his H/o Chronic Microvascular Diasease Will incrase the Satin to 20 mg BPH Symptoms Stable on Flomax    Family/ staff Communication:   Labs/tests ordered:    Total time spent in this patient care encounter was 45_ minutes; greater than 50% of the visit spent counseling patient, reviewing records , Labs and coordinating care for problems addressed at this encounter.

## 2018-03-16 LAB — HEPATIC FUNCTION PANEL
ALK PHOS: 36 (ref 25–125)
ALT: 13 (ref 10–40)
AST: 15 (ref 14–40)
BILIRUBIN, TOTAL: 0.4

## 2018-03-16 LAB — CBC AND DIFFERENTIAL
HCT: 43 (ref 41–53)
Hemoglobin: 14.2 (ref 13.5–17.5)
Platelets: 122 — AB (ref 150–399)
WBC: 3.7

## 2018-03-16 LAB — BASIC METABOLIC PANEL
BUN: 38 — AB (ref 4–21)
CREATININE: 1.4 — AB (ref <=1.3)
Glucose: 81
POTASSIUM: 4.5 (ref 3.4–5.3)
SODIUM: 144 (ref 137–147)

## 2018-03-17 ENCOUNTER — Other Ambulatory Visit: Payer: Self-pay | Admitting: *Deleted

## 2018-03-17 LAB — COMPLETE METABOLIC PANEL WITH GFR
Albumin: 3.3
Calcium: 9
Carbon Dioxide, Total: 27
Chloride: 111
GFR CALC NON AF AMER: 47
Globulin: 2.2
Total Protein: 5.5 g/dL

## 2018-03-17 MED ORDER — CLONAZEPAM 0.5 MG PO TABS: 0.5000 mg | ORAL_TABLET | Freq: Every day | ORAL | 0 refills | Status: DC

## 2018-03-23 LAB — BASIC METABOLIC PANEL
BUN: 40 — AB (ref 4–21)
Creatinine: 1.4 — AB (ref 0.6–1.3)
Glucose: 108
Potassium: 4.2 (ref 3.4–5.3)
Sodium: 139 (ref 137–147)

## 2018-03-23 LAB — HEPATIC FUNCTION PANEL
ALK PHOS: 40 (ref 25–125)
ALT: 17 (ref 10–40)
AST: 19 (ref 14–40)
Bilirubin, Total: 0.6

## 2018-03-23 LAB — CBC AND DIFFERENTIAL
HCT: 42 (ref 41–53)
Hemoglobin: 13.9 (ref 13.5–17.5)
PLATELETS: 110 — AB (ref 150–399)
WBC: 6.7

## 2018-03-24 ENCOUNTER — Non-Acute Institutional Stay (SKILLED_NURSING_FACILITY): Payer: Medicare Other | Admitting: Nurse Practitioner

## 2018-03-24 ENCOUNTER — Encounter: Payer: Self-pay | Admitting: Nurse Practitioner

## 2018-03-24 DIAGNOSIS — I1310 Hypertensive heart and chronic kidney disease without heart failure, with stage 1 through stage 4 chronic kidney disease, or unspecified chronic kidney disease: Secondary | ICD-10-CM | POA: Diagnosis not present

## 2018-03-24 DIAGNOSIS — J069 Acute upper respiratory infection, unspecified: Secondary | ICD-10-CM | POA: Diagnosis not present

## 2018-03-24 NOTE — Progress Notes (Signed)
Location:  [facility] Room Number: 37 Place of Service:  SNF (31) Provider:  Marlana Latus  NP  Lera Gaines X, NP  Patient Care Team: Ahmeer Tuman X, NP as PCP - General (Internal Medicine) [name], MD as Consulting Physician (Urology) [name], MD as Consulting Physician (Neurology) [name], MD as Consulting Physician (Dermatology) [name], MD (Inactive) as Consulting Physician (Gastroenterology) [name], MD as Consulting Physician (Orthopedic Surgery) [name], MD as Consulting Physician (Cardiology) [name], MD as Consulting Physician (Ophthalmology) [name], MD as Referring Physician (Psychiatry) [name], MD as Consulting Physician (General Surgery) [name], MD as Consulting Physician (Orthopedic Surgery)  Extended Emergency Contact Information Primary Emergency Contact: Logiudice,[name] Address: [address]          [place], [place] [address] [place] Phone: [phone] Relation: Spouse  Code Status:  DNR Goals of care: Advanced Directive information Advanced Directives 03/24/2018  Does Patient Have a Medical Advance Directive? Yes  Type of Advance Directive Out of facility DNR (pink MOST or yellow form)  Does patient want to make changes to medical advance directive? No - Patient declined  Copy of [place] in Chart? -  Pre-existing out of facility DNR order (yellow form or pink MOST form) Yellow form placed in chart (order not valid for inpatient use);Pink MOST form placed in chart (order not valid for inpatient use)     Chief Complaint  Patient presents with  . Acute Visit    Elevated temp, weakness    HPI:  Pt is a 85 y.o. male seen today for an acute visit for cough, generalized weakness, resolved fever yesterday after Rocephin 1gm x1 . CBC/diff, CMP, CXR unremarkable, except Neutrophils 85.5%.  He denied chest  pain, SOB, palpitation, or sputum production. Doxycycline 100mg  bid 03/01/18-2/720 prescribed by Ophthalmologist. HTN, blood pressure is controlled on Amlodipine 10mg  qd   Past Medical History:  Diagnosis Date  . Acute urinary retention 01/28/2013  . AKI (acute kidney injury) ([place]) 01/26/2013  . Altered mental status 09/09/2013  . Anemia    B12 deficiency  . ANXIETY 06/07/2007   Qualifier: Diagnosis of  By: Talbert Cage CMA ([place]), June    . ARTHRITIS 06/07/2007   Qualifier: Diagnosis of  By: Talbert Cage CMA ([place]), June    . Balance problems 04/05/2011  . Benign essential tremor   . Benign fibroma of prostate 10/11/2011  . Bilateral inguinal hernia 10/09/2012  . Bilateral leg edema 09/13/2012  . Bladder retention 02/05/2013  . Bradycardia, sinus 08/02/2012  . CAROTID ARTERY DISEASE 03/31/2007   Qualifier: Diagnosis of  By: Linna Darner MD, Gwyndolyn Saxon    . Carotid stenosis    s/p L CEA  . Cataract, nuclear 03/21/2014  . 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 Cervical spinal stenosis   . Cervical stenosis of spine   . Critical lower limb ischemia   . Degeneration macular 11/02/2011  . Depression    Dr Albertine Patricia  . Difficulty in walking(719.7) 04/05/2011  . ESOPHAGITIS 08/06/2002   Qualifier: Diagnosis of  By: Talbert Cage CMA ([place]), June    . Essential and other specified forms of tremor 04/18/2013  . Fall at home 01/06/2017   left hip pain  . Falls   . Family history of colon cancer 07/24/2012  . Fecal impaction ([place]) 10/11/2012  . Femoral hernia, bilateral s/p lap repair 01/16/2013 01/16/2013  .  Glaucoma, compensated 03/21/2014  . H/O cardiovascular stress test    a. 02/2003 -> no ischemia/infarct.  Marland Kitchen HAMMER TOE 04/23/2010   Qualifier: Diagnosis of  By: Linna Darner MD, Gwyndolyn Saxon    . Hematoma of leg   . Hip hematoma, right 09/09/2013  . History of shingles 2009  . HTN (hypertension) 08/22/2012  . Hx of echocardiogram    Echocardiogram 08/01/12: Mild LVH, EF 55-60%, normal wall motion.  . Hydrocele 10/11/2011  .  Hyperlipidemia   . 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 Hypothyroidism   . Macular degeneration disease   . Macular edema   . Migraines    Dr Jannifer Franklin  . MORTON'S NEUROMA, LEFT 08/22/2007   Qualifier: Diagnosis of  By: Linna Darner MD, Gwyndolyn Saxon    . PAD (peripheral artery disease) ([place])   . Peripheral neuropathy   . Personal history of colonic polyps 01/05/2013   2014 2 mm adenomatous polyp   . PREMATURE ATRIAL CONTRACTIONS 03/14/2006   Qualifier: Diagnosis of  By: Linna Darner MD, Gwendolyn Lima 12/16/2011  . SPINAL STENOSIS 03/14/2006   Qualifier: Diagnosis of  By: Linna Darner MD, William   Cervical spine    . Testosterone deficiency    Dr Lawerance Bach, [facility]  . Unspecified vitamin D deficiency 08/06/2012  . Urge incontinence 10/09/2012  . Urinary tract infection, site not specified 02/13/2013  . Weakness 08/02/2012   Past Surgical History:  Procedure Laterality Date  . APPENDECTOMY  1941  . BREAST CYST EXCISION Left 01/16/2013   Procedure: MASS EXCISION AXILLA;  Surgeon: Adin Hector, MD;  Location: [place];  Service: General;  Laterality: Left;  . CAROTID ENDARTERECTOMY  2005    L  . CATARACT EXTRACTION Right   . COLONOSCOPY  2014   Dr. Deatra Ina  . EYE SURGERY Right 02/2012   retina  . INGUINAL HERNIA REPAIR Bilateral 01/16/2013   Procedure: LAPAROSCOPIC BILATERAL INGUINAL DIRECT AND INDIRECT, FEMORAL, AND OBTURATOR HERNIAS;  Surgeon: Adin Hector, MD;  Location: [facility];  Service: General;  Laterality: Bilateral;  . INSERTION OF MESH N/A 01/16/2013   Procedure: INSERTION OF MESH;  Surgeon: Adin Hector, MD;  Location: [facility];  Service: General;  Laterality: N/A;  . LUMBAR LAMINECTOMY    . TONSILLECTOMY AND ADENOIDECTOMY    . TOTAL HIP ARTHROPLASTY  2005   L    Allergies  Allergen Reactions  . Latex Rash  . Tape Rash    Paper tape is ok to use     Outpatient Encounter Medications as of 03/24/2018  Medication Sig  . acetaminophen (TYLENOL) 500 MG tablet Take 1,000 mg by mouth 3  (three) times daily.   Marland Kitchen amLODipine (NORVASC) 10 MG tablet Take 10 mg by mouth daily.  Marland Kitchen aspirin 81 MG tablet Take 81 mg by mouth every morning.   Marland Kitchen atorvastatin (LIPITOR) 10 MG tablet Take 10 mg by mouth daily.  . B Complex-C (B-COMPLEX WITH VITAMIN C) tablet Take 1 tablet by mouth daily.  . bimatoprost (LUMIGAN) 0.01 % SOLN Place 1 drop into both eyes at bedtime.  Marland Kitchen buPROPion (WELLBUTRIN XL) 300 MG 24 hr tablet Take 300 mg by mouth daily. per psychiatry recommendation  . Cholecalciferol (VITAMIN D3) 1000 units CAPS Take 2,000 Units by mouth daily.   . clonazePAM (KLONOPIN) 0.5 MG tablet Take 1 tablet (0.5 mg total) by mouth at bedtime.  Marland Kitchen doxycycline (VIBRA-TABS) 100 MG tablet Take 100 mg by mouth 2 (two) times daily.  Marland Kitchen emollient (BIAFINE) cream Apply  topically daily.  Marland Kitchen erythromycin ophthalmic ointment Place 1 application into both eyes at bedtime.   . fluticasone (CUTIVATE) 0.05 % cream Apply 1 application topically 2 (two) times daily as needed (irritation).   Marland Kitchen ketoconazole (NIZORAL) 2 % cream Apply 1 application topically 2 (two) times daily as needed for irritation.   Javier Docker Oil 500 MG CAPS Take 500 mg by mouth every morning.   Marland Kitchen levothyroxine (SYNTHROID, LEVOTHROID) 50 MCG tablet Take 50 mcg by mouth daily before breakfast. Take one tablet daily except on Sunday's take 1/2 tablet.  . linaclotide (LINZESS) 145 MCG CAPS capsule Take 1 capsule (145 mcg total) by mouth daily before breakfast.  . loratadine (CLARITIN) 10 MG tablet Take 10 mg by mouth daily.  . Melatonin 3 MG TABS Take 1 tablet by mouth at bedtime.  . mirtazapine (REMERON) 30 MG tablet Take 60 mg by mouth at bedtime.  . Multiple Vitamins-Minerals (ICAPS PO) Take 1 tablet by mouth daily.  Marland Kitchen nystatin (NYSTATIN) powder Apply topically 2 (two) times daily.  Vladimir Faster Glycol-Propyl Glycol (SYSTANE) 0.4-0.3 % SOLN Apply 1 drop to eye 4 (four) times daily.  . polyethylene glycol (MIRALAX / GLYCOLAX) packet Take 17 g by mouth  daily. Hold for loose stool  . sertraline (ZOLOFT) 100 MG tablet Take 200 mg by mouth every morning.   . tamsulosin (FLOMAX) 0.4 MG CAPS capsule TAKE 1 CAPSULE (0.4 MG TOTAL) BY MOUTH DAILY.  Marland Kitchen topiramate (TOPAMAX) 25 MG tablet TAKE 1 TABLET BY MOUTH EVERY MORNING AND 2 TABLETS BY MOUTH EVERY EVENING   No facility-administered encounter medications on file as of 03/24/2018.     Review of Systems  Constitutional: Positive for activity change, appetite change, fatigue and fever. Negative for chills.  HENT: Positive for congestion and hearing loss. Negative for sinus pressure, sinus pain, sneezing, sore throat and voice change.   Respiratory: Positive for cough. Negative for choking, shortness of breath and wheezing.   Cardiovascular: Negative for chest pain, palpitations and leg swelling.  Gastrointestinal: Negative for abdominal distention, abdominal pain, constipation, diarrhea, nausea and vomiting.  Musculoskeletal: Positive for arthralgias and gait problem.  Skin: Negative for color change and pallor.  Neurological: Negative for dizziness, speech difficulty, weakness and headaches.  Psychiatric/Behavioral: Negative for agitation, confusion, hallucinations and sleep disturbance. The patient is not nervous/anxious.     Immunization History  Administered Date(s) Administered  . DTaP 11/05/2005  . Hepatitis B 1932/05/09  . Influenza Split 11/12/2011, 11/29/2013  . Influenza Whole 10/30/2007, 11/12/2008, 11/26/2009  . Influenza, High Dose Seasonal PF 12/31/2015, 11/24/2016  . Influenza,inj,quad, With Preservative 10/16/2016  . Influenza-Unspecified 11/28/2017  . PPD Test 06/23/2011  . Pneumococcal Conjugate-13 01/14/2016  . Pneumococcal Polysaccharide-23 02/16/1999  . Tdap 01/13/2017  . Varicella 11/09/2005  . Zoster 08/30/2006   Pertinent  Health Maintenance Due  Topic Date Due  . INFLUENZA VACCINE  Completed  . PNA vac Low Risk Adult  Completed   Fall Risk  10/28/2017 10/27/2016  06/09/2016 02/03/2016 10/28/2015  Falls in the past year? Yes Yes Yes Yes Yes  Number falls in past yr: 1 2 or more 2 or more 2 or more 2 or more  Comment - - - 2 falls in last 2 months -  Injury with Fall? Yes Yes No Yes No  Comment - L hip - scraps and bruises -  Risk Factor Category  - - - - -  Risk for fall due to : - - - - Impaired balance/gait  Follow  up - - - - -   Functional Status Survey:    Vitals:   03/24/18 0908  BP: 123/63  Pulse: 78  Resp: 20  Temp: (!) 97.1 F (36.2 C)  SpO2: 96%  Weight: 147 lb 12.8 oz (67 kg)  Height: 6' (1.829 m)   Body mass index is 20.05 kg/m. Physical Exam Constitutional:      General: He is not in acute distress.    Appearance: Normal appearance. He is not ill-appearing, toxic-appearing or diaphoretic.  HENT:     Head: Normocephalic and atraumatic.     Nose: Nose normal.     Mouth/Throat:     Mouth: Mucous membranes are moist.  Eyes:     Extraocular Movements: Extraocular movements intact.     Pupils: Pupils are equal, round, and reactive to light.  Cardiovascular:     Rate and Rhythm: Normal rate and regular rhythm.     Heart sounds: No murmur.  Pulmonary:     Effort: Pulmonary effort is normal.     Breath sounds: No wheezing, rhonchi or rales.  Abdominal:     General: There is no distension.     Palpations: Abdomen is soft.     Tenderness: There is no abdominal tenderness. There is no guarding or rebound.  Musculoskeletal:     Right lower leg: No edema.     Left lower leg: No edema.  Skin:    General: Skin is warm and dry.  Neurological:     General: No focal deficit present.     Mental Status: He is alert. Mental status is at baseline.     Motor: No weakness.     Coordination: Coordination normal.     Gait: Gait abnormal.     Comments: Essential tremor. Oriented to person and place.   Psychiatric:        Mood and Affect: Mood normal.        Behavior: Behavior normal.     Labs reviewed: Recent Labs    04/14/17  09/05/17 11/09/17 03/16/18  NA 142  142 142 139 144  K 4.4  4.4 4.4 4.4 4.5  CL  --   --   --  111  CO2  --   --   --  27  BUN 34*  --  36* 38*  CREATININE 1.3  1.26 1.29 1.2 1.4*  CALCIUM 8.8 8.5  --  9.0   Recent Labs    04/14/17 09/05/17 11/09/17 03/16/18  AST 15  15 15 21 15   ALT 15  15 14 18 13   ALKPHOS 40  40 40 42 36  BILITOT 0.4 39.0  0.4  --   --   PROT 5.4 5.5  --  5.5  ALBUMIN 3.3 3.4  --  3.3   Recent Labs    09/13/17 11/09/17 03/16/18  WBC 5.0 6.7 3.7  NEUTROABS 3,235  --   --   HGB 15.5 14.3 14.2  HCT 48 43 43  PLT 144* 138* 122*   Lab Results  Component Value Date   TSH 2.43 10/03/2017   Lab Results  Component Value Date   HGBA1C 5 01/26/2018   Lab Results  Component Value Date   CHOL 176 01/05/2018   HDL 45 01/05/2018   LDLCALC 112 01/05/2018   LDLDIRECT 132.5 09/08/2012   TRIG 93 01/05/2018   CHOLHDL 2.7 11/15/2016    Significant Diagnostic Results in last 30 days:  No results found.  Assessment/Plan URI (upper respiratory  infection) 03/23/18 wbc 6.7, Hgb 13.9, plt 110, neutrophils 85.5%, Na 139, K 4.2, Bun 40, creat 1.36, TP 5.8, albumin 3.6. Will start Z-pk, Mucinex 600mg  bid x 5 days. Observe.   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      Family/ staff Communication: plan of care reviewed with the patient and charge charge nurse.   Labs/tests ordered:  none  Time spend 25 minutes.

## 2018-03-24 NOTE — Assessment & Plan Note (Addendum)
03/23/18 wbc 6.7, Hgb 13.9, plt 110, neutrophils 85.5%, Na 139, K 4.2, Bun 40, creat 1.36, TP 5.8, albumin 3.6. Will start Z-pk, Mucinex 600mg  bid x 5 days. Observe.

## 2018-03-24 NOTE — Assessment & Plan Note (Signed)
Blood pressure is controlled, continue Amlodipine 10mg qd.  

## 2018-04-04 ENCOUNTER — Encounter: Payer: Self-pay | Admitting: Nurse Practitioner

## 2018-04-04 ENCOUNTER — Non-Acute Institutional Stay (SKILLED_NURSING_FACILITY): Payer: Medicare Other | Admitting: Nurse Practitioner

## 2018-04-04 DIAGNOSIS — F332 Major depressive disorder, recurrent severe without psychotic features: Secondary | ICD-10-CM

## 2018-04-04 DIAGNOSIS — H10403 Unspecified chronic conjunctivitis, bilateral: Secondary | ICD-10-CM

## 2018-04-04 DIAGNOSIS — R238 Other skin changes: Secondary | ICD-10-CM | POA: Diagnosis not present

## 2018-04-04 DIAGNOSIS — I1310 Hypertensive heart and chronic kidney disease without heart failure, with stage 1 through stage 4 chronic kidney disease, or unspecified chronic kidney disease: Secondary | ICD-10-CM

## 2018-04-04 DIAGNOSIS — R634 Abnormal weight loss: Secondary | ICD-10-CM

## 2018-04-04 DIAGNOSIS — R5383 Other fatigue: Secondary | ICD-10-CM | POA: Diagnosis not present

## 2018-04-04 DIAGNOSIS — K5909 Other constipation: Secondary | ICD-10-CM

## 2018-04-04 DIAGNOSIS — E039 Hypothyroidism, unspecified: Secondary | ICD-10-CM

## 2018-04-04 NOTE — Assessment & Plan Note (Signed)
gradual weight loss: 12/21/17 #158Ibs, 01/23/18 # 156 Ibs, 03/15/18 #150Ibs, 03/24/18 #147Ibs, 04/04/18 # 146Ibs. Followed by dietary. Will update CBC CMP TSH

## 2018-04-04 NOTE — Assessment & Plan Note (Signed)
Hypothyroidism, last TSH 2.43 10/03/17, continue Levothyroxine 76mcg qd. Update TSH in setting of weight loss.

## 2018-04-04 NOTE — Assessment & Plan Note (Signed)
HTN, blood pressure is controlled, continue  Amlodipine 10mg  qd.

## 2018-04-04 NOTE — Assessment & Plan Note (Signed)
Chronic conjunctivitis Doxy 100mg  bid per ophthalmology.

## 2018-04-04 NOTE — Assessment & Plan Note (Signed)
mood is stable, continue Topamax 25mg  qd, Sertraline 200mg  qd, Mirtazapine 30mg  qd, Clonazepam 0.5mg  qhs, Wellbutrin 300mg  qd.

## 2018-04-04 NOTE — Assessment & Plan Note (Signed)
underwent dermatology evaluation: Nystatin powder bid, prn bid Fluticasone 0.05% cream available-not used so far.  Will schedule 0.05% Fluticasone cream bid x 10 days, then prn. Avoid pressure and moist.

## 2018-04-04 NOTE — Assessment & Plan Note (Signed)
Generalized, will update CBC CMP TSH, PT to eval and treat: strengthening, gait, therapeutic exercise as indicated.

## 2018-04-04 NOTE — Assessment & Plan Note (Signed)
Constipation, stable, continue MiraLax daily, Linzess 149mcg qd.

## 2018-04-04 NOTE — Progress Notes (Signed)
Location:  [facility] Room Number: 37 Place of Service:  SNF (31) Provider:  Marlana Latus  NP  Won Kreuzer X, NP  Patient Care Team: Dori Devino X, NP as PCP - General (Internal Medicine) [name], MD as Consulting Physician (Urology) [name], MD as Consulting Physician (Neurology) [name], MD as Consulting Physician (Dermatology) [name], MD (Inactive) as Consulting Physician (Gastroenterology) [name], MD as Consulting Physician (Orthopedic Surgery) [name], MD as Consulting Physician (Cardiology) [name], MD as Consulting Physician (Ophthalmology) [name], MD as Referring Physician (Psychiatry) [name], MD as Consulting Physician (General Surgery) [name], MD as Consulting Physician (Orthopedic Surgery)  Extended Emergency Contact Information Primary Emergency Contact: Horton,[name] Address: [address]          [place], [place] [address] [place] Phone: [phone] Relation: Spouse  Code Status:  DNR Goals of care: Advanced Directive information Advanced Directives 03/24/2018  Does Patient Have a Medical Advance Directive? Yes  Type of Advance Directive Out of facility DNR (pink MOST or yellow form)  Does patient want to make changes to medical advance directive? No - Patient declined  Copy of [place] in Chart? -  Pre-existing out of facility DNR order (yellow form or pink MOST form) Yellow form placed in chart (order not valid for inpatient use);Pink MOST form placed in chart (order not valid for inpatient use)     Chief Complaint  Patient presents with  . Medical Management of Chronic Issues    HPI:  Pt is a 85 y.o. male seen today for medical management of chronic diseases.     The patient has gradual weight loss: 12/21/17 #158Ibs, 01/23/18 # 156 Ibs, 03/15/18 #150Ibs, 03/24/18 #147Ibs, 04/04/18 # 146Ibs. Followed  by dietary. C/o soreness in sacral coccygeal area, underwent dermatology evaluation: Nystatin powder bid, prn bid Fluticasone 0.05% cream available-not used so far. The patient has unsteady gait and generalized weakness, tired easily. Hx of depression, mood is stable on Topamax 25mg  qd, Sertraline 200mg  qd, Mirtazapine 30mg  qd, Clonazepam 0.5mg  qhs, Wellbutrin 300mg  qd. Constipation, stable on MiraLax daily, Linzess 158mcg qd. Hypothyroidism, on Levothyroxine 58mcg qd. HTN, blood pressure is controlled on Amlodipine 10mg  qd. Chronic conjunctivitis Doxy 100mg  bid per ophthalmology.    Past Medical History:  Diagnosis Date  . Acute urinary retention 01/28/2013  . AKI (acute kidney injury) ([facility]) 01/26/2013  . Altered mental status 09/09/2013  . Anemia    B12 deficiency  . ANXIETY 06/07/2007   Qualifier: Diagnosis of  By: Talbert Cage CMA ([place]), June    . ARTHRITIS 06/07/2007   Qualifier: Diagnosis of  By: Talbert Cage CMA ([place]), June    . Balance problems 04/05/2011  . Benign essential tremor   . Benign fibroma of prostate 10/11/2011  . Bilateral inguinal hernia 10/09/2012  . Bilateral leg edema 09/13/2012  . Bladder retention 02/05/2013  . Bradycardia, sinus 08/02/2012  . CAROTID ARTERY DISEASE 03/31/2007   Qualifier: Diagnosis of  By: Linna Darner MD, Gwyndolyn Saxon    . Carotid stenosis    s/p L CEA  . Cataract, nuclear 03/21/2014  . 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 Cervical spinal stenosis   . Cervical stenosis of spine   . Critical lower limb ischemia   . Degeneration macular 11/02/2011  . Depression    Dr Albertine Patricia  . Difficulty in walking(719.7) 04/05/2011  . ESOPHAGITIS 08/06/2002  Qualifier: Diagnosis of  By: Talbert Cage CMA Deborra Medina), June    . Essential and other specified forms of tremor 04/18/2013  . Fall at home 01/06/2017   left hip pain  . Falls   . Family history of colon cancer 07/24/2012  . Fecal impaction ([facility]) 10/11/2012  . Femoral hernia, bilateral s/p lap repair 01/16/2013 01/16/2013  . Glaucoma,  compensated 03/21/2014  . H/O cardiovascular stress test    a. 02/2003 -> no ischemia/infarct.  Marland Kitchen HAMMER TOE 04/23/2010   Qualifier: Diagnosis of  By: Linna Darner MD, Gwyndolyn Saxon    . Hematoma of leg   . Hip hematoma, right 09/09/2013  . History of shingles 2009  . HTN (hypertension) 08/22/2012  . Hx of echocardiogram    Echocardiogram 08/01/12: Mild LVH, EF 55-60%, normal wall motion.  . Hydrocele 10/11/2011  . Hyperlipidemia   . 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 Hypothyroidism   . Macular degeneration disease   . Macular edema   . Migraines    Dr Jannifer Franklin  . MORTON'S NEUROMA, LEFT 08/22/2007   Qualifier: Diagnosis of  By: Linna Darner MD, Gwyndolyn Saxon    . PAD (peripheral artery disease) ([facility])   . Peripheral neuropathy   . Personal history of colonic polyps 01/05/2013   2014 2 mm adenomatous polyp   . PREMATURE ATRIAL CONTRACTIONS 03/14/2006   Qualifier: Diagnosis of  By: Linna Darner MD, Gwendolyn Lima 12/16/2011  . SPINAL STENOSIS 03/14/2006   Qualifier: Diagnosis of  By: Linna Darner MD, William   Cervical spine    . Testosterone deficiency    Dr Lawerance Bach, [facility]  . Unspecified vitamin D deficiency 08/06/2012  . Urge incontinence 10/09/2012  . Urinary tract infection, site not specified 02/13/2013  . Weakness 08/02/2012   Past Surgical History:  Procedure Laterality Date  . APPENDECTOMY  1941  . BREAST CYST EXCISION Left 01/16/2013   Procedure: MASS EXCISION AXILLA;  Surgeon: Adin Hector, MD;  Location: [facility];  Service: General;  Laterality: Left;  . CAROTID ENDARTERECTOMY  2005    L  . CATARACT EXTRACTION Right   . COLONOSCOPY  2014   Dr. Deatra Ina  . EYE SURGERY Right 02/2012   retina  . INGUINAL HERNIA REPAIR Bilateral 01/16/2013   Procedure: LAPAROSCOPIC BILATERAL INGUINAL DIRECT AND INDIRECT, FEMORAL, AND OBTURATOR HERNIAS;  Surgeon: Adin Hector, MD;  Location: [facility];  Service: General;  Laterality: Bilateral;  . INSERTION OF MESH N/A 01/16/2013   Procedure: INSERTION OF MESH;  Surgeon:  Adin Hector, MD;  Location: [facility];  Service: General;  Laterality: N/A;  . LUMBAR LAMINECTOMY    . TONSILLECTOMY AND ADENOIDECTOMY    . TOTAL HIP ARTHROPLASTY  2005   L    Allergies  Allergen Reactions  . Latex Rash  . Tape Rash    Paper tape is ok to use     Outpatient Encounter Medications as of 04/04/2018  Medication Sig  . acetaminophen (TYLENOL) 500 MG tablet Take 1,000 mg by mouth 3 (three) times daily.   Marland Kitchen amLODipine (NORVASC) 10 MG tablet Take 10 mg by mouth daily.  Marland Kitchen aspirin 81 MG tablet Take 81 mg by mouth every morning.   Marland Kitchen atorvastatin (LIPITOR) 10 MG tablet Take 10 mg by mouth daily.  . B Complex-C (B-COMPLEX WITH VITAMIN C) tablet Take 1 tablet by mouth daily.  . bimatoprost (LUMIGAN) 0.01 % SOLN Place 1 drop into both eyes at bedtime.  Marland Kitchen buPROPion (WELLBUTRIN XL) 300 MG 24 hr tablet Take  300 mg by mouth daily. per psychiatry recommendation  . Cholecalciferol (VITAMIN D3) 1000 units CAPS Take 1,000 Units by mouth daily.   . clonazePAM (KLONOPIN) 0.5 MG tablet Take 1 tablet (0.5 mg total) by mouth at bedtime.  Marland Kitchen doxycycline (VIBRA-TABS) 100 MG tablet Take 100 mg by mouth 2 (two) times daily.  Marland Kitchen emollient (BIAFINE) cream Apply topically 2 (two) times daily.   Marland Kitchen erythromycin ophthalmic ointment Place 1 application into both eyes at bedtime.   . fluticasone (CUTIVATE) 0.05 % cream Apply 1 application topically 2 (two) times daily as needed (irritation).   Marland Kitchen ketoconazole (NIZORAL) 2 % cream Apply 1 application topically 2 (two) times daily as needed for irritation.   Javier Docker Oil 500 MG CAPS Take 500 mg by mouth every morning.   Marland Kitchen levothyroxine (SYNTHROID, LEVOTHROID) 50 MCG tablet Take 50 mcg by mouth daily before breakfast. Take one tablet daily except on Sunday's take 1/2 tablet.  . linaclotide (LINZESS) 145 MCG CAPS capsule Take 1 capsule (145 mcg total) by mouth daily before breakfast.  . loratadine (CLARITIN) 10 MG tablet Take 10 mg by mouth daily.  . Melatonin 3  MG TABS Take 1 tablet by mouth at bedtime.  . mirtazapine (REMERON) 30 MG tablet Take 60 mg by mouth at bedtime.  . Multiple Vitamins-Minerals (ICAPS PO) Take 1 tablet by mouth daily.  Marland Kitchen nystatin (NYSTATIN) powder Apply topically 2 (two) times daily.  Vladimir Faster Glycol-Propyl Glycol (SYSTANE) 0.4-0.3 % SOLN Apply 1 drop to eye 4 (four) times daily.  . polyethylene glycol (MIRALAX / GLYCOLAX) packet Take 17 g by mouth daily. Hold for loose stool  . sertraline (ZOLOFT) 100 MG tablet Take 200 mg by mouth every morning.   . tamsulosin (FLOMAX) 0.4 MG CAPS capsule TAKE 1 CAPSULE (0.4 MG TOTAL) BY MOUTH DAILY.  Marland Kitchen topiramate (TOPAMAX) 25 MG tablet TAKE 1 TABLET BY MOUTH EVERY MORNING AND 2 TABLETS BY MOUTH EVERY EVENING   No facility-administered encounter medications on file as of 04/04/2018.     Review of Systems  Constitutional: Positive for fatigue and unexpected weight change. Negative for activity change, appetite change, chills and diaphoresis.       Weight loss   HENT: Positive for hearing loss. Negative for congestion and voice change.   Eyes: Positive for visual disturbance.       Crusted eyelashes. Chronic conjunctivitis-doxy bid per ophthalmology.   Respiratory: Positive for shortness of breath. Negative for cough and wheezing.        DOE  Cardiovascular: Negative for chest pain, palpitations and leg swelling.  Gastrointestinal: Negative for abdominal distention, abdominal pain, constipation, diarrhea, nausea and vomiting.  Genitourinary: Negative for difficulty urinating, dysuria and urgency.  Musculoskeletal: Positive for arthralgias and gait problem.  Skin: Negative for color change and pallor.       Peeling irritated sacral coccygeal area, non blanchable redness in the area.   Neurological: Positive for tremors. Negative for dizziness, speech difficulty and headaches.       Memory lapses.   Psychiatric/Behavioral: Negative for agitation, behavioral problems, dysphoric mood,  hallucinations and sleep disturbance. The patient is not nervous/anxious.     Immunization History  Administered Date(s) Administered  . DTaP 11/05/2005  . Hepatitis B 1932/04/27  . Influenza Split 11/12/2011, 11/29/2013  . Influenza Whole 10/30/2007, 11/12/2008, 11/26/2009  . Influenza, High Dose Seasonal PF 12/31/2015, 11/24/2016  . Influenza,inj,quad, With Preservative 10/16/2016  . Influenza-Unspecified 11/28/2017  . PPD Test 06/23/2011  . Pneumococcal Conjugate-13 01/14/2016  .  Pneumococcal Polysaccharide-23 02/16/1999  . Tdap 01/13/2017  . Varicella 11/09/2005  . Zoster 08/30/2006   Pertinent  Health Maintenance Due  Topic Date Due  . INFLUENZA VACCINE  Completed  . PNA vac Low Risk Adult  Completed   Fall Risk  10/28/2017 10/27/2016 06/09/2016 02/03/2016 10/28/2015  Falls in the past year? Yes Yes Yes Yes Yes  Number falls in past yr: 1 2 or more 2 or more 2 or more 2 or more  Comment - - - 2 falls in last 2 months -  Injury with Fall? Yes Yes No Yes No  Comment - L hip - scraps and bruises -  Risk Factor Category  - - - - -  Risk for fall due to : - - - - Impaired balance/gait  Follow up - - - - -   Functional Status Survey:    Vitals:   04/04/18 0832  BP: 136/75  Pulse: 70  Resp: 20  Temp: (!) 97.1 F (36.2 C)  SpO2: 96%  Weight: 146 lb 8 oz (66.5 kg)  Height: 6' (1.829 m)   Body mass index is 19.87 kg/m. Physical Exam Constitutional:      General: He is not in acute distress.    Appearance: Normal appearance. He is normal weight. He is not ill-appearing, toxic-appearing or diaphoretic.  HENT:     Head: Normocephalic and atraumatic.     Nose: Nose normal.     Mouth/Throat:     Mouth: Mucous membranes are moist.  Eyes:     Extraocular Movements: Extraocular movements intact.     Conjunctiva/sclera: Conjunctivae normal.     Pupils: Pupils are equal, round, and reactive to light.     Comments: Crusted eyelashes.   Neck:     Musculoskeletal: Normal  range of motion and neck supple.  Cardiovascular:     Rate and Rhythm: Normal rate and regular rhythm.     Heart sounds: No murmur.  Pulmonary:     Effort: Pulmonary effort is normal.     Breath sounds: No wheezing, rhonchi or rales.  Abdominal:     General: There is no distension.     Palpations: Abdomen is soft.     Tenderness: There is no abdominal tenderness. There is no guarding or rebound.  Musculoskeletal:     Right lower leg: No edema.     Left lower leg: No edema.     Comments: W/c for mobility.   Skin:    General: Skin is warm and dry.     Findings: Erythema present.     Comments: Peeling irritated sacral coccygeal area, non blanchable redness in the area.    Neurological:     General: No focal deficit present.     Mental Status: He is alert. Mental status is at baseline.     Cranial Nerves: No cranial nerve deficit.     Motor: No weakness.     Coordination: Coordination normal.     Gait: Gait abnormal.     Comments: Oriented to person and place.   Psychiatric:        Mood and Affect: Mood normal.        Behavior: Behavior normal.     Labs reviewed: Recent Labs    04/14/17 09/05/17 11/09/17 03/16/18  NA 142  142 142 139 144  K 4.4  4.4 4.4 4.4 4.5  CL  --   --   --  111  CO2  --   --   --  27  BUN 34*  --  36* 38*  CREATININE 1.3  1.26 1.29 1.2 1.4*  CALCIUM 8.8 8.5  --  9.0   Recent Labs    04/14/17 09/05/17 11/09/17 03/16/18  AST 15  15 15 21 15   ALT 15  15 14 18 13   ALKPHOS 40  40 40 42 36  BILITOT 0.4 39.0  0.4  --   --   PROT 5.4 5.5  --  5.5  ALBUMIN 3.3 3.4  --  3.3   Recent Labs    09/13/17 11/09/17 03/16/18  WBC 5.0 6.7 3.7  NEUTROABS 3,235  --   --   HGB 15.5 14.3 14.2  HCT 48 43 43  PLT 144* 138* 122*   Lab Results  Component Value Date   TSH 2.43 10/03/2017   Lab Results  Component Value Date   HGBA1C 5 01/26/2018   Lab Results  Component Value Date   CHOL 176 01/05/2018   HDL 45 01/05/2018   LDLCALC 112 01/05/2018    LDLDIRECT 132.5 09/08/2012   TRIG 93 01/05/2018   CHOLHDL 2.7 11/15/2016    Significant Diagnostic Results in last 30 days:  No results found.  Assessment/Plan Weight loss gradual weight loss: 12/21/17 #158Ibs, 01/23/18 # 156 Ibs, 03/15/18 #150Ibs, 03/24/18 #147Ibs, 04/04/18 # 146Ibs. Followed by dietary. Will update CBC CMP TSH  Skin irritation underwent dermatology evaluation: Nystatin powder bid, prn bid Fluticasone 0.05% cream available-not used so far.  Will schedule 0.05% Fluticasone cream bid x 10 days, then prn. Avoid pressure and moist.   Fatigue Generalized, will update CBC CMP TSH, PT to eval and treat: strengthening, gait, therapeutic exercise as indicated.   Depression  mood is stable, continue Topamax 25mg  qd, Sertraline 200mg  qd, Mirtazapine 30mg  qd, Clonazepam 0.5mg  qhs, Wellbutrin 300mg  qd.  Chronic constipation  Constipation, stable, continue MiraLax daily, Linzess 171mcg qd.   Hypothyroidism Hypothyroidism, last TSH 2.43 10/03/17, continue Levothyroxine 91mcg qd. Update TSH in setting of weight loss.   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  Chronic conjunctivitis  Chronic conjunctivitis Doxy 100mg  bid per ophthalmology.       Family/ staff Communication: plan of care reviewed with the patient and charge nurse.   Labs/tests ordered:  CBC CMP TSH   Time spend 25 minutes.

## 2018-04-07 LAB — CBC AND DIFFERENTIAL
HCT: 44 (ref 41–53)
Hemoglobin: 14.2 (ref 13.5–17.5)
Platelets: 159 (ref 150–399)
WBC: 5.5

## 2018-04-07 LAB — BASIC METABOLIC PANEL
BUN: 34 — AB (ref 4–21)
Creatinine: 1.2 (ref 0.6–1.3)
Glucose: 70
Potassium: 4.3 (ref 3.4–5.3)
SODIUM: 142 (ref 137–147)

## 2018-04-07 LAB — HEPATIC FUNCTION PANEL
ALT: 18 (ref 10–40)
AST: 20 (ref 14–40)
Alkaline Phosphatase: 42 (ref 25–125)
BILIRUBIN, TOTAL: 0.4

## 2018-04-07 LAB — TSH: TSH: 2.37 (ref 0.41–5.90)

## 2018-05-01 ENCOUNTER — Encounter: Payer: Self-pay | Admitting: Family

## 2018-05-02 NOTE — Progress Notes (Signed)
Opened in error; Disregard.

## 2018-05-04 ENCOUNTER — Encounter: Payer: Self-pay | Admitting: Family

## 2018-05-04 ENCOUNTER — Non-Acute Institutional Stay (SKILLED_NURSING_FACILITY): Payer: Medicare Other | Admitting: Family

## 2018-05-04 DIAGNOSIS — F329 Major depressive disorder, single episode, unspecified: Secondary | ICD-10-CM

## 2018-05-04 DIAGNOSIS — E782 Mixed hyperlipidemia: Secondary | ICD-10-CM

## 2018-05-04 DIAGNOSIS — E039 Hypothyroidism, unspecified: Secondary | ICD-10-CM

## 2018-05-04 DIAGNOSIS — R2681 Unsteadiness on feet: Secondary | ICD-10-CM

## 2018-05-04 DIAGNOSIS — K5909 Other constipation: Secondary | ICD-10-CM

## 2018-05-04 DIAGNOSIS — I739 Peripheral vascular disease, unspecified: Secondary | ICD-10-CM | POA: Diagnosis not present

## 2018-05-04 DIAGNOSIS — I1310 Hypertensive heart and chronic kidney disease without heart failure, with stage 1 through stage 4 chronic kidney disease, or unspecified chronic kidney disease: Secondary | ICD-10-CM | POA: Diagnosis not present

## 2018-05-04 NOTE — Progress Notes (Signed)
Location:  [facility] Room Number: 37 Place of Service:  SNF (31) Provider: Mindy Gali FNP-C   Mast, Man X, NP  Patient Care Team: Mast, Man X, NP as PCP - General (Internal Medicine) [name], MD as Consulting Physician (Urology) [name], MD as Consulting Physician (Neurology) [name], MD as Consulting Physician (Dermatology) [name], MD (Inactive) as Consulting Physician (Gastroenterology) [name], MD as Consulting Physician (Orthopedic Surgery) [name], MD as Consulting Physician (Cardiology) [name], MD as Consulting Physician (Ophthalmology) [name], MD as Referring Physician (Psychiatry) [name], MD as Consulting Physician (General Surgery) [name], MD as Consulting Physician (Orthopedic Surgery)  Extended Emergency Contact Information Primary Emergency Contact: Shinall,[name] Address: [address]          [place], [place] [address] [place] Phone: [phone] Relation: Spouse  Code Status:  Full Code  Goals of care: Advanced Directive information Advanced Directives 05/04/2018  Does Patient Have a Medical Advance Directive? Yes  Type of Paramedic of [place];Out of facility DNR (pink MOST or yellow form)  Does patient want to make changes to medical advance directive? No - Patient declined  Copy of [place] in Chart? Yes - validated most recent copy scanned in chart (See row information)  Pre-existing out of facility DNR order (yellow form or pink MOST form) Yellow form placed in chart (order not valid for inpatient use);Pink MOST form placed in chart (order not valid for inpatient use)     Chief Complaint  Patient presents with  . Medical Management of Chronic Issues    Routine Visit    HPI:  Pt is a 85 y.o. male seen today [facility] for medical management of chronic  diseases.He is seen in his room today.He has a medical history of Hypertension,hyperlipidemia,PAD,BPH,CKD stage 2,Hypothyroidism,Major Depression managed by Psychiatry,Tremors,cervical spinal stenosis with myelopathy,chronic migraine headache among other conditions. He denies any acute issues during visit.Facility Nurse reports no new concerns.No recent fall episode or acute illnesses.He continues to work with Physical therapy ambulates on front wheel walker.Also uses wheelchair for long distance at times.Had previous weight lost but weight reviewed has gained 2 lbs over one month.      Past Medical History:  Diagnosis Date  . Acute urinary retention 01/28/2013  . AKI (acute kidney injury) ([facility]) 01/26/2013  . Altered mental status 09/09/2013  . Anemia    B12 deficiency  . ANXIETY 06/07/2007   Qualifier: Diagnosis of  By: Talbert Cage CMA ([place]), June    . ARTHRITIS 06/07/2007   Qualifier: Diagnosis of  By: Talbert Cage CMA ([place]), June    . Balance problems 04/05/2011  . Benign essential tremor   . Benign fibroma of prostate 10/11/2011  . Bilateral inguinal hernia 10/09/2012  . Bilateral leg edema 09/13/2012  . Bladder retention 02/05/2013  . Bradycardia, sinus 08/02/2012  . CAROTID ARTERY DISEASE 03/31/2007   Qualifier: Diagnosis of  By: Linna Darner MD, Gwyndolyn Saxon    . Carotid stenosis    s/p L CEA  . Cataract, nuclear 03/21/2014  . 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 Cervical spinal stenosis   . Cervical stenosis of spine   . Critical lower limb ischemia   . Degeneration macular 11/02/2011  . Depression    Dr Albertine Patricia  . Difficulty in walking(719.7) 04/05/2011  . ESOPHAGITIS 08/06/2002   Qualifier: Diagnosis of  By:  McMurray CMA (AAMA), June    . Essential and other specified forms of tremor 04/18/2013  . Fall at home 01/06/2017   left hip pain  . Falls   . Family history of colon cancer 07/24/2012  . Fecal impaction ([facility]) 10/11/2012  . Femoral hernia, bilateral s/p lap repair 01/16/2013 01/16/2013  .  Glaucoma, compensated 03/21/2014  . H/O cardiovascular stress test    a. 02/2003 -> no ischemia/infarct.  Marland Kitchen HAMMER TOE 04/23/2010   Qualifier: Diagnosis of  By: Linna Darner MD, Gwyndolyn Saxon    . Hematoma of leg   . Hip hematoma, right 09/09/2013  . History of shingles 2009  . HTN (hypertension) 08/22/2012  . Hx of echocardiogram    Echocardiogram 08/01/12: Mild LVH, EF 55-60%, normal wall motion.  . Hydrocele 10/11/2011  . Hyperlipidemia   . 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 Hypothyroidism   . Macular degeneration disease   . Macular edema   . Migraines    Dr Jannifer Franklin  . MORTON'S NEUROMA, LEFT 08/22/2007   Qualifier: Diagnosis of  By: Linna Darner MD, Gwyndolyn Saxon    . PAD (peripheral artery disease) ([facility])   . Peripheral neuropathy   . Personal history of colonic polyps 01/05/2013   2014 2 mm adenomatous polyp   . PREMATURE ATRIAL CONTRACTIONS 03/14/2006   Qualifier: Diagnosis of  By: Linna Darner MD, Gwendolyn Lima 12/16/2011  . SPINAL STENOSIS 03/14/2006   Qualifier: Diagnosis of  By: Linna Darner MD, William   Cervical spine    . Testosterone deficiency    Dr Lawerance Bach, [facility]  . Unspecified vitamin D deficiency 08/06/2012  . Urge incontinence 10/09/2012  . Urinary tract infection, site not specified 02/13/2013  . Weakness 08/02/2012   Past Surgical History:  Procedure Laterality Date  . APPENDECTOMY  1941  . BREAST CYST EXCISION Left 01/16/2013   Procedure: MASS EXCISION AXILLA;  Surgeon: Adin Hector, MD;  Location: [facility];  Service: General;  Laterality: Left;  . CAROTID ENDARTERECTOMY  2005    L  . CATARACT EXTRACTION Right   . COLONOSCOPY  2014   Dr. Deatra Ina  . EYE SURGERY Right 02/2012   retina  . INGUINAL HERNIA REPAIR Bilateral 01/16/2013   Procedure: LAPAROSCOPIC BILATERAL INGUINAL DIRECT AND INDIRECT, FEMORAL, AND OBTURATOR HERNIAS;  Surgeon: Adin Hector, MD;  Location: [facility];  Service: General;  Laterality: Bilateral;  . INSERTION OF MESH N/A 01/16/2013   Procedure: INSERTION OF MESH;   Surgeon: Adin Hector, MD;  Location: [facility];  Service: General;  Laterality: N/A;  . LUMBAR LAMINECTOMY    . TONSILLECTOMY AND ADENOIDECTOMY    . TOTAL HIP ARTHROPLASTY  2005   L    Allergies  Allergen Reactions  . Latex Rash  . Tape Rash    Paper tape is ok to use     Allergies as of 05/04/2018      Reactions   Latex Rash   Tape Rash   Paper tape is ok to use      Medication List       Accurate as of May 04, 2018 12:02 PM. Always use your most recent med list.        acetaminophen 500 MG tablet Commonly known as:  TYLENOL Take 1,000 mg by mouth 3 (three) times daily.   amLODipine 10 MG tablet Commonly known as:  NORVASC Take 10 mg by mouth daily.   aspirin 81 MG tablet Take 81 mg by mouth every morning.   atorvastatin  10 MG tablet Commonly known as:  LIPITOR Take 10 mg by mouth daily.   B-complex with vitamin C tablet Take 1 tablet by mouth daily.   bimatoprost 0.01 % Soln Commonly known as:  LUMIGAN Place 1 drop into both eyes at bedtime.   buPROPion 300 MG 24 hr tablet Commonly known as:  WELLBUTRIN XL Take 300 mg by mouth daily. per psychiatry recommendation   clonazePAM 0.5 MG tablet Commonly known as:  KLONOPIN Take 1 tablet (0.5 mg total) by mouth at bedtime.   erythromycin ophthalmic ointment Place 1 application into both eyes at bedtime.   fluticasone 0.05 % cream Commonly known as:  CUTIVATE Apply 1 application topically 2 (two) times daily as needed (irritation).   ICAPS PO Take 1 tablet by mouth daily.   ketoconazole 2 % cream Commonly known as:  NIZORAL Apply 1 application topically 2 (two) times daily as needed for irritation.   Krill Oil 500 MG Caps Take 500 mg by mouth every morning.   levothyroxine 50 MCG tablet Commonly known as:  SYNTHROID, LEVOTHROID Take 50 mcg by mouth daily before breakfast. Take one tablet daily except on Sunday's take 1/2 tablet.   linaclotide 145 MCG Caps capsule Commonly known as:  LINZESS  Take 1 capsule (145 mcg total) by mouth daily before breakfast.   loratadine 10 MG tablet Commonly known as:  CLARITIN Take 10 mg by mouth daily.   Melatonin 3 MG Tabs Take 1 tablet by mouth at bedtime.   mirtazapine 30 MG tablet Commonly known as:  REMERON Take 60 mg by mouth at bedtime.   nystatin powder Generic drug:  nystatin Apply topically 2 (two) times daily. Apply to left buttocks   polyethylene glycol packet Commonly known as:  MIRALAX / GLYCOLAX Take 17 g by mouth daily. Hold for loose stool   sertraline 100 MG tablet Commonly known as:  ZOLOFT Take 200 mg by mouth every morning.   Systane 0.4-0.3 % Soln Generic drug:  Polyethyl Glycol-Propyl Glycol Apply 1 drop to eye 4 (four) times daily.   tamsulosin 0.4 MG Caps capsule Commonly known as:  FLOMAX TAKE 1 CAPSULE (0.4 MG TOTAL) BY MOUTH DAILY.   topiramate 25 MG tablet Commonly known as:  TOPAMAX TAKE 1 TABLET BY MOUTH EVERY MORNING AND 2 TABLETS BY MOUTH EVERY EVENING   Vitamin D3 25 MCG (1000 UT) Caps Take 1,000 Units by mouth daily.       Review of Systems  Constitutional: Negative for appetite change, chills and fever.       Has had 2 lbs weight gain over one month.Had previous weight loss therefore weight gain beneficial to patient.   HENT: Negative for congestion, rhinorrhea, sinus pressure, sinus pain, sneezing and sore throat.   Eyes: Positive for visual disturbance. Negative for pain, discharge, redness and itching.  Respiratory: Negative for cough, chest tightness, shortness of breath and wheezing.   Cardiovascular: Negative for chest pain, palpitations and leg swelling.  Gastrointestinal: Negative for abdominal distention, abdominal pain, constipation, diarrhea, nausea and vomiting.  Endocrine: Negative for cold intolerance, heat intolerance, polydipsia, polyphagia and polyuria.  Genitourinary: Negative for difficulty urinating, dysuria, flank pain and urgency.  Musculoskeletal: Positive for  gait problem.  Skin: Negative for color change, pallor and rash.  Neurological: Negative for dizziness and light-headedness.       Chronic tremors   Psychiatric/Behavioral: Negative for agitation, behavioral problems, confusion, sleep disturbance and suicidal ideas. The patient is not nervous/anxious.     Immunization History  Administered Date(s) Administered  . DTaP 11/05/2005  . Hepatitis B 1932-10-10  . Influenza Split 11/12/2011, 11/29/2013  . Influenza Whole 10/30/2007, 11/12/2008, 11/26/2009  . Influenza, High Dose Seasonal PF 12/31/2015, 11/24/2016  . Influenza,inj,quad, With Preservative 10/16/2016  . Influenza-Unspecified 11/28/2017  . PPD Test 06/23/2011  . Pneumococcal Conjugate-13 01/14/2016  . Pneumococcal Polysaccharide-23 02/16/1999  . Tdap 01/13/2017  . Varicella 11/09/2005  . Zoster 08/30/2006   Pertinent  Health Maintenance Due  Topic Date Due  . INFLUENZA VACCINE  Completed  . PNA vac Low Risk Adult  Completed   Fall Risk  10/28/2017 10/27/2016 06/09/2016 02/03/2016 10/28/2015  Falls in the past year? Yes Yes Yes Yes Yes  Number falls in past yr: 1 2 or more 2 or more 2 or more 2 or more  Comment - - - 2 falls in last 2 months -  Injury with Fall? Yes Yes No Yes No  Comment - L hip - scraps and bruises -  Risk Factor Category  - - - - -  Risk for fall due to : - - - - Impaired balance/gait  Follow up - - - - -    Vitals:   05/04/18 0840  BP: 134/83  Pulse: 62  Resp: 16  Temp: (!) 97.3 F (36.3 C)  TempSrc: Oral  SpO2: 98%  Weight: 148 lb 1.6 oz (67.2 kg)  Height: 6' (1.829 m)   Body mass index is 20.09 kg/m. Physical Exam Vitals signs and nursing note reviewed.  Constitutional:      General: He is not in acute distress.    Appearance: He is normal weight. He is not ill-appearing or diaphoretic.  HENT:     Head: Normocephalic.     Nose: Nose normal. No congestion or rhinorrhea.     Mouth/Throat:     Mouth: Mucous membranes are moist.      Pharynx: Oropharynx is clear. No oropharyngeal exudate or posterior oropharyngeal erythema.  Eyes:     General: No scleral icterus.    Conjunctiva/sclera: Conjunctivae normal.     Comments: Right eye round reactive to light.left eye blind.corrective lens in place.   Neck:     Musculoskeletal: Normal range of motion. No neck rigidity or muscular tenderness.     Vascular: No carotid bruit.  Cardiovascular:     Rate and Rhythm: Normal rate and regular rhythm.     Pulses: Normal pulses.     Heart sounds: Normal heart sounds. No murmur. No friction rub. No gallop.   Pulmonary:     Effort: Pulmonary effort is normal. No respiratory distress.     Breath sounds: Normal breath sounds. No wheezing, rhonchi or rales.  Chest:     Chest wall: No tenderness.  Abdominal:     General: Bowel sounds are normal. There is no distension.     Palpations: Abdomen is soft. There is no mass.     Tenderness: There is no abdominal tenderness. There is no right CVA tenderness, left CVA tenderness, guarding or rebound.  Musculoskeletal:        General: No swelling or tenderness.     Right lower leg: No edema.     Left lower leg: No edema.     Comments: Unsteady gait ambulates with front wheel walker and self propels on wheelchair at times for long distance.   Lymphadenopathy:     Cervical: No cervical adenopathy.  Skin:    General: Skin is warm and dry.     Capillary Refill:  Capillary refill takes 2 to 3 seconds.     Coloration: Skin is not pale.     Findings: No erythema or rash.  Neurological:     Mental Status: He is alert and oriented to person, place, and time.     Cranial Nerves: No cranial nerve deficit.     Sensory: No sensory deficit.     Coordination: Coordination normal.     Gait: Gait abnormal.  Psychiatric:        Mood and Affect: Mood normal.        Speech: Speech normal.        Behavior: Behavior normal.        Thought Content: Thought content normal.        Judgment: Judgment normal.      Labs reviewed: Recent Labs    09/05/17  03/16/18 03/23/18 04/07/18  NA 142   < > 144 139 142  K 4.4   < > 4.5 4.2 4.3  CL  --   --  111  --   --   CO2  --   --  27  --   --   BUN  --    < > 38* 40* 34*  CREATININE 1.29   < > 1.4* 1.4* 1.2  CALCIUM 8.5  --  9.0  --   --    < > = values in this interval not displayed.   Recent Labs    09/05/17  03/16/18 03/23/18 04/07/18  AST 15   < > 15 19 20   ALT 14   < > 13 17 18   ALKPHOS 40   < > 36 40 42  BILITOT 39.0  0.4  --   --   --   --   PROT 5.5  --  5.5  --   --   ALBUMIN 3.4  --  3.3  --   --    < > = values in this interval not displayed.   Recent Labs    09/13/17  03/16/18 03/23/18 04/07/18  WBC 5.0   < > 3.7 6.7 5.5  NEUTROABS 3,235  --   --   --   --   HGB 15.5   < > 14.2 13.9 14.2  HCT 48   < > 43 42 44  PLT 144*   < > 122* 110* 159   < > = values in this interval not displayed.   Lab Results  Component Value Date   TSH 2.37 04/07/2018   Lab Results  Component Value Date   HGBA1C 5 01/26/2018   Lab Results  Component Value Date   CHOL 176 01/05/2018   HDL 45 01/05/2018   LDLCALC 112 01/05/2018   LDLDIRECT 132.5 09/08/2012   TRIG 93 01/05/2018   CHOLHDL 2.7 11/15/2016    Significant Diagnostic Results in last 30 days:  No results found.  Assessment/Plan 1. 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  2. Acquired hypothyroidism Lab Results  Component Value Date   TSH 2.37 04/07/2018  Continue on levothyroxine 50 mcg daily before breakfast.continue to monitor TSH level.   3. Major depression, chronic Mood stable.managed by Psychiatry seen last 03/13/2018 to follow up in 4-6 weeks.continue on Wellbutrin 300 mg tablet every  morning,clonazepam 0.5 mg tablet  daily,Sertraline 200 mg tablet daily and mirtazipine 60 mg tablet at bedtime.continue to monitor for mood changes.continue to follow up with Psychiatry service as recommended.    4. PAD (peripheral artery disease) ([facility]) No edema or ulcers noted.continue to monitor.continue on ASA and statin.   5. Chronic constipation Linzess 145 mcg daily and Miralax 17 gm daily effective.encouraged hydration and oral intake.  6.Hyperlipidemia  Lab Results  Component Value Date   CHOL 176 01/05/2018   HDL 45 01/05/2018   LDLCALC 112 01/05/2018   LDLDIRECT 132.5 09/08/2012   TRIG 93 01/05/2018   CHOLHDL 2.7 11/15/2016  LDL not at goal.continue on atorvastatin 10 mg tablet daily.Lipid panel with next labs.   Family/ staff Communication: Reviewed plan of care with patient and facility Nurse.   Labs/tests ordered: None   Ulysses Alper C Karan Ramnauth, NP

## 2018-05-31 ENCOUNTER — Non-Acute Institutional Stay (SKILLED_NURSING_FACILITY): Payer: Medicare Other | Admitting: Family

## 2018-05-31 ENCOUNTER — Encounter: Payer: Self-pay | Admitting: Family

## 2018-05-31 DIAGNOSIS — I1310 Hypertensive heart and chronic kidney disease without heart failure, with stage 1 through stage 4 chronic kidney disease, or unspecified chronic kidney disease: Secondary | ICD-10-CM | POA: Diagnosis not present

## 2018-05-31 DIAGNOSIS — I739 Peripheral vascular disease, unspecified: Secondary | ICD-10-CM | POA: Diagnosis not present

## 2018-05-31 DIAGNOSIS — F329 Major depressive disorder, single episode, unspecified: Secondary | ICD-10-CM

## 2018-05-31 DIAGNOSIS — E785 Hyperlipidemia, unspecified: Secondary | ICD-10-CM

## 2018-05-31 DIAGNOSIS — E039 Hypothyroidism, unspecified: Secondary | ICD-10-CM | POA: Diagnosis not present

## 2018-05-31 NOTE — Progress Notes (Signed)
Location:  [facility] Room Number: 37 Place of Service:  SNF (31) Provider: Marlowe Sax NP   Mast, Man X, NP  Patient Care Team: Mast, Man X, NP as PCP - General (Internal Medicine) [name], MD as Consulting Physician (Urology) [name], MD as Consulting Physician (Neurology) [name], MD as Consulting Physician (Dermatology) [name], MD (Inactive) as Consulting Physician (Gastroenterology) [name], MD as Consulting Physician (Orthopedic Surgery) [name], MD as Consulting Physician (Cardiology) [name], MD as Consulting Physician (Ophthalmology) [name], MD as Referring Physician (Psychiatry) [name], MD as Consulting Physician (General Surgery) [name], MD as Consulting Physician (Orthopedic Surgery) Neida Ellegood, [name], NP as Nurse Practitioner (Family Medicine)  Extended Emergency Contact Information Primary Emergency Contact: Aderhold,[name] Address: [address]          [place], [place] [address] [place] Phone: [phone] Relation: Spouse  Code Status: DNR Goals of care: Advanced Directive information Advanced Directives 05/31/2018  Does Patient Have a Medical Advance Directive? Yes  Type of Paramedic of [place];Out of facility DNR (pink MOST or yellow form);Living will  Does patient want to make changes to medical advance directive? No - Patient declined  Copy of [place] in Chart? Yes - validated most recent copy scanned in chart (See row information)  Pre-existing out of facility DNR order (yellow form or pink MOST form) Yellow form placed in chart (order not valid for inpatient use);Pink MOST form placed in chart (order not valid for inpatient use)     Chief Complaint  Patient presents with  . Medical Management of Chronic Issues    Routine visit     HPI:  Pt is a 85 y.o. male  seen today for medical management of chronic diseases.He has a medical history of Hypertension,CKD stage 3,Hyperlipidemia,BPH,AMD,major Depression/Anxiety,hypothyroidism,essenstial tremors,migraine  Headache among other conditions.He is seen in his room today.He denies any acute issues during visit.Has been working with Physical therapy twice per week for gait stability,exercise and lower extremities muscle strengthening.He self propels on wheelchair and walks with front wheel walk for short distance.He has had no recent fall episodes.He states his appetite is fair.His weight log reviewed weight stable over the past one month with a 1.9 lbs weight gain over one month.Facility Nurse reports no new concerns.      Past Medical History:  Diagnosis Date  . Acute urinary retention 01/28/2013  . AKI (acute kidney injury) ([facility]) 01/26/2013  . Altered mental status 09/09/2013  . Anemia    B12 deficiency  . ANXIETY 06/07/2007   Qualifier: Diagnosis of  By: Talbert Cage CMA ([place]), June    . ARTHRITIS 06/07/2007   Qualifier: Diagnosis of  By: Talbert Cage CMA ([place]), June    . Balance problems 04/05/2011  . Benign essential tremor   . Benign fibroma of prostate 10/11/2011  . Bilateral inguinal hernia 10/09/2012  . Bilateral leg edema 09/13/2012  . Bladder retention 02/05/2013  . Bradycardia, sinus 08/02/2012  . CAROTID ARTERY DISEASE 03/31/2007   Qualifier: Diagnosis of  By: Linna Darner MD, Gwyndolyn Saxon    . Carotid stenosis    s/p L CEA  . Cataract, nuclear 03/21/2014  . 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 Cervical spinal stenosis   . Cervical stenosis of spine   . Critical lower limb ischemia   . Degeneration macular 11/02/2011  . Depression  Dr Albertine Patricia  . Difficulty in walking(719.7) 04/05/2011  . ESOPHAGITIS 08/06/2002   Qualifier: Diagnosis of  By: Talbert Cage CMA ([place]), June    . Essential and other specified forms of tremor 04/18/2013  . Fall at home 01/06/2017   left hip pain  . Falls   . Family history of colon  cancer 07/24/2012  . Fecal impaction ([place]) 10/11/2012  . Femoral hernia, bilateral s/p lap repair 01/16/2013 01/16/2013  . Glaucoma, compensated 03/21/2014  . H/O cardiovascular stress test    a. 02/2003 -> no ischemia/infarct.  Marland Kitchen HAMMER TOE 04/23/2010   Qualifier: Diagnosis of  By: Linna Darner MD, Gwyndolyn Saxon    . Hematoma of leg   . Hip hematoma, right 09/09/2013  . History of shingles 2009  . HTN (hypertension) 08/22/2012  . Hx of echocardiogram    Echocardiogram 08/01/12: Mild LVH, EF 55-60%, normal wall motion.  . Hydrocele 10/11/2011  . Hyperlipidemia   . 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 Hypothyroidism   . Macular degeneration disease   . Macular edema   . Migraines    Dr Jannifer Franklin  . MORTON'S NEUROMA, LEFT 08/22/2007   Qualifier: Diagnosis of  By: Linna Darner MD, Gwyndolyn Saxon    . PAD (peripheral artery disease) ([facility])   . Peripheral neuropathy   . Personal history of colonic polyps 01/05/2013   2014 2 mm adenomatous polyp   . PREMATURE ATRIAL CONTRACTIONS 03/14/2006   Qualifier: Diagnosis of  By: Linna Darner MD, Gwendolyn Lima 12/16/2011  . SPINAL STENOSIS 03/14/2006   Qualifier: Diagnosis of  By: Linna Darner MD, [place]   Cervical spine    . Testosterone deficiency    Dr Lawerance Bach, [facility]  . Unspecified vitamin D deficiency 08/06/2012  . Urge incontinence 10/09/2012  . Urinary tract infection, site not specified 02/13/2013  . Weakness 08/02/2012   Past Surgical History:  Procedure Laterality Date  . APPENDECTOMY  1941  . BREAST CYST EXCISION Left 01/16/2013   Procedure: MASS EXCISION AXILLA;  Surgeon: Adin Hector, MD;  Location: [facility];  Service: General;  Laterality: Left;  . CAROTID ENDARTERECTOMY  2005    L  . CATARACT EXTRACTION Right   . COLONOSCOPY  2014   Dr. Deatra Ina  . EYE SURGERY Right 02/2012   retina  . INGUINAL HERNIA REPAIR Bilateral 01/16/2013   Procedure: LAPAROSCOPIC BILATERAL INGUINAL DIRECT AND INDIRECT, FEMORAL, AND OBTURATOR HERNIAS;  Surgeon: Adin Hector, MD;  Location:  [facility];  Service: General;  Laterality: Bilateral;  . INSERTION OF MESH N/A 01/16/2013   Procedure: INSERTION OF MESH;  Surgeon: Adin Hector, MD;  Location: [facility];  Service: General;  Laterality: N/A;  . LUMBAR LAMINECTOMY    . TONSILLECTOMY AND ADENOIDECTOMY    . TOTAL HIP ARTHROPLASTY  2005   L    Allergies  Allergen Reactions  . Latex Rash  . Tape Rash    Paper tape is ok to use     Outpatient Encounter Medications as of 05/31/2018  Medication Sig  . acetaminophen (TYLENOL) 500 MG tablet Take 1,000 mg by mouth 3 (three) times daily.   Marland Kitchen amLODipine (NORVASC) 10 MG tablet Take 10 mg by mouth daily.  Marland Kitchen aspirin 81 MG tablet Take 81 mg by mouth every morning.   Marland Kitchen atorvastatin (LIPITOR) 10 MG tablet Take 10 mg by mouth daily.  . B Complex-C (B-COMPLEX WITH VITAMIN C) tablet Take 1 tablet by mouth daily.  . bimatoprost (LUMIGAN) 0.01 % SOLN Place 1 drop into both  eyes at bedtime.  Marland Kitchen buPROPion (WELLBUTRIN XL) 300 MG 24 hr tablet Take 300 mg by mouth daily. per psychiatry recommendation  . Cholecalciferol (VITAMIN D3) 1000 units CAPS Take 1,000 Units by mouth daily.   . clonazePAM (KLONOPIN) 0.5 MG tablet Take 1 tablet (0.5 mg total) by mouth at bedtime.  Marland Kitchen erythromycin ophthalmic ointment Place 1 application into both eyes at bedtime.   . fluticasone (CUTIVATE) 0.05 % cream Apply 1 application topically 2 (two) times daily as needed (irritation).   Marland Kitchen ketoconazole (NIZORAL) 2 % cream Apply 1 application topically 2 (two) times daily as needed for irritation.   Javier Docker Oil 500 MG CAPS Take 500 mg by mouth every morning.   Marland Kitchen levothyroxine (SYNTHROID, LEVOTHROID) 50 MCG tablet Take 50 mcg by mouth daily before breakfast. Take one tablet daily except on Sunday's take 1/2 tablet.  . linaclotide (LINZESS) 145 MCG CAPS capsule Take 1 capsule (145 mcg total) by mouth daily before breakfast.  . loratadine (CLARITIN) 10 MG tablet Take 10 mg by mouth daily.  . Melatonin 3 MG TABS Take 1 tablet by  mouth at bedtime.  . mirtazapine (REMERON) 30 MG tablet Take 60 mg by mouth at bedtime.  . Multiple Vitamins-Minerals (ICAPS PO) Take 1 tablet by mouth daily.  Marland Kitchen nystatin (NYSTATIN) powder Apply topically 2 (two) times daily. Apply to left buttocks  . Polyethyl Glycol-Propyl Glycol (SYSTANE) 0.4-0.3 % SOLN Apply 1 drop to eye 4 (four) times daily.  . polyethylene glycol (MIRALAX / GLYCOLAX) packet Take 17 g by mouth daily. Hold for loose stool  . sertraline (ZOLOFT) 100 MG tablet Take 200 mg by mouth every morning.   . tamsulosin (FLOMAX) 0.4 MG CAPS capsule TAKE 1 CAPSULE (0.4 MG TOTAL) BY MOUTH DAILY.  Marland Kitchen topiramate (TOPAMAX) 25 MG tablet TAKE 1 TABLET BY MOUTH EVERY MORNING AND 2 TABLETS BY MOUTH EVERY EVENING   No facility-administered encounter medications on file as of 05/31/2018.     Review of Systems  Constitutional: Negative for activity change, appetite change, chills, fatigue, fever and unexpected weight change.  HENT: Negative for congestion, postnasal drip, rhinorrhea, sinus pressure, sinus pain, sneezing and sore throat.   Eyes: Positive for visual disturbance. Negative for pain, discharge, redness and itching.  Respiratory: Negative for cough, chest tightness, shortness of breath and wheezing.   Cardiovascular: Negative for chest pain, palpitations and leg swelling.  Gastrointestinal: Negative for abdominal distention, abdominal pain, constipation, diarrhea, nausea and vomiting.  Endocrine: Negative for cold intolerance, heat intolerance, polydipsia, polyphagia and polyuria.  Genitourinary: Negative for difficulty urinating, dysuria, flank pain and urgency.  Musculoskeletal: Positive for gait problem.  Skin: Negative for color change, pallor, rash and wound.  Neurological: Negative for dizziness, light-headedness and headaches.  Hematological: Does not bruise/bleed easily.  Psychiatric/Behavioral: Negative for agitation, confusion and sleep disturbance. The patient is not  nervous/anxious.     Immunization History  Administered Date(s) Administered  . DTaP 11/05/2005  . Hepatitis B 09-27-1932  . Influenza Split 11/12/2011, 11/29/2013  . Influenza Whole 10/30/2007, 11/12/2008, 11/26/2009  . Influenza, High Dose Seasonal PF 12/31/2015, 11/24/2016  . Influenza,inj,quad, With Preservative 10/16/2016  . Influenza-Unspecified 11/28/2017  . PPD Test 06/23/2011  . Pneumococcal Conjugate-13 01/14/2016  . Pneumococcal Polysaccharide-23 02/16/1999  . Tdap 01/13/2017  . Varicella 11/09/2005  . Zoster 08/30/2006   Pertinent  Health Maintenance Due  Topic Date Due  . INFLUENZA VACCINE  09/16/2018  . PNA vac Low Risk Adult  Completed   Fall Risk  10/28/2017 10/27/2016 06/09/2016 02/03/2016 10/28/2015  Falls in the past year? Yes Yes Yes Yes Yes  Number falls in past yr: 1 2 or more 2 or more 2 or more 2 or more  Comment - - - 2 falls in last 2 months -  Injury with Fall? Yes Yes No Yes No  Comment - L hip - scraps and bruises -  Risk Factor Category  - - - - -  Risk for fall due to : - - - - Impaired balance/gait  Follow up - - - - -    Vitals:   05/31/18 1055  BP: (!) 148/80  Pulse: (!) 58  Resp: 20  Temp: (!) 97.2 F (36.2 C)  SpO2: 96%  Weight: 149 lb 12.8 oz (67.9 kg)  Height: 6' (1.829 m)   Body mass index is 20.32 kg/m. Physical Exam Vitals signs and nursing note reviewed.  Constitutional:      General: He is not in acute distress.    Appearance: He is normal weight. He is not ill-appearing.  HENT:     Head: Normocephalic.     Right Ear: Tympanic membrane, ear canal and external ear normal. There is no impacted cerumen.     Left Ear: Tympanic membrane, ear canal and external ear normal. There is no impacted cerumen.     Nose: Nose normal. No congestion or rhinorrhea.     Mouth/Throat:     Mouth: Mucous membranes are moist.     Pharynx: Oropharynx is clear. No oropharyngeal exudate or posterior oropharyngeal erythema.  Eyes:     General:  No scleral icterus.       Right eye: No discharge.        Left eye: No discharge.     Conjunctiva/sclera: Conjunctivae normal.     Comments: Left eye legal blind.Right pupil round and reactive to light.   Cardiovascular:     Rate and Rhythm: Regular rhythm. Bradycardia present.     Pulses: Normal pulses.     Heart sounds: No murmur. No friction rub. No gallop.   Pulmonary:     Effort: Pulmonary effort is normal. No respiratory distress.     Breath sounds: Normal breath sounds. No wheezing, rhonchi or rales.  Chest:     Chest wall: No tenderness.  Abdominal:     General: Bowel sounds are normal. There is no distension.     Palpations: Abdomen is soft. There is no mass.     Tenderness: There is no abdominal tenderness. There is no right CVA tenderness, left CVA tenderness, guarding or rebound.  Musculoskeletal:     Right lower leg: No edema.     Left lower leg: No edema.     Comments: Moves x 4 extremities unsteady gait.self propels on wheelchair and walks with Front wheel walker for short distance.   Skin:    General: Skin is warm and dry.     Coloration: Skin is not pale.     Findings: No bruising, erythema, lesion or rash.     Comments: Sacral area previous PU site scab zinc oxide ointment applied by nursing staff no signs of breakdown or infection.   Neurological:     Mental Status: He is alert and oriented to person, place, and time.     Cranial Nerves: No cranial nerve deficit.     Sensory: No sensory deficit.     Coordination: Coordination normal.     Gait: Gait abnormal.     Comments: Chronic Essential  tremors head and hands.   Psychiatric:        Mood and Affect: Mood normal.        Speech: Speech normal.        Behavior: Behavior normal.        Thought Content: Thought content normal.        Judgment: Judgment normal.    Labs reviewed: Recent Labs    09/05/17  03/16/18 03/23/18 04/07/18  NA 142   < > 144 139 142  K 4.4   < > 4.5 4.2 4.3  CL  --   --  111  --    --   CO2  --   --  27  --   --   BUN  --    < > 38* 40* 34*  CREATININE 1.29   < > 1.4* 1.4* 1.2  CALCIUM 8.5  --  9.0  --   --    < > = values in this interval not displayed.   Recent Labs    09/05/17  03/16/18 03/23/18 04/07/18  AST 15   < > 15 19 20   ALT 14   < > 13 17 18   ALKPHOS 40   < > 36 40 42  BILITOT 39.0  0.4  --   --   --   --   PROT 5.5  --  5.5  --   --   ALBUMIN 3.4  --  3.3  --   --    < > = values in this interval not displayed.   Recent Labs    09/13/17  03/16/18 03/23/18 04/07/18  WBC 5.0   < > 3.7 6.7 5.5  NEUTROABS 3,235  --   --   --   --   HGB 15.5   < > 14.2 13.9 14.2  HCT 48   < > 43 42 44  PLT 144*   < > 122* 110* 159   < > = values in this interval not displayed.   Lab Results  Component Value Date   TSH 2.37 04/07/2018   Lab Results  Component Value Date   HGBA1C 5 01/26/2018   Lab Results  Component Value Date   CHOL 176 01/05/2018   HDL 45 01/05/2018   LDLCALC 112 01/05/2018   LDLDIRECT 132.5 09/08/2012   TRIG 93 01/05/2018   CHOLHDL 2.7 11/15/2016    Significant Diagnostic Results in last 30 days:  No results found.  Assessment/Plan 1. 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   2. Acquired hypothyroidism Lab Results  Component Value Date   TSH 2.37 04/07/2018  Continue on levothyroxine 50 mcg tablet daily before breakfast.monitor TSH level.   3. Hyperlipidemia LDL goal <70 latest LDL not at goal 112 (12/2017).continue on atorvastatin 10 mg tablet daily.   4. PAD (peripheral artery disease) (HCC) No edema or ulceration.continue on ASA and statin.   5. Major depression, chronic Mood stable.managed by Psychiatry service.continue on Wellbutrin 300 mg 24 hour tablet daily,mirtazapine 60 mg tablet at  bedtime,Zoloft 200 mg tablet daily and clonazepam 0.5 mg tablet at bedtime.follow up with Psychiatry.continue to monitor for mood changes.  Family/ staff Communication: Reviewed plan of care with patient and facility Nurse.   Labs/tests ordered: None

## 2018-06-13 ENCOUNTER — Non-Acute Institutional Stay (SKILLED_NURSING_FACILITY): Payer: Medicare Other | Admitting: Nurse Practitioner

## 2018-06-13 ENCOUNTER — Encounter: Payer: Self-pay | Admitting: Nurse Practitioner

## 2018-06-13 DIAGNOSIS — R21 Rash and other nonspecific skin eruption: Secondary | ICD-10-CM | POA: Diagnosis not present

## 2018-06-13 DIAGNOSIS — J309 Allergic rhinitis, unspecified: Secondary | ICD-10-CM | POA: Diagnosis not present

## 2018-06-13 NOTE — Assessment & Plan Note (Addendum)
Fore arms R+L, presumed bug bites, will apply Benadryl extra strength itch stopping Gel 4x/day x 5 days.

## 2018-06-13 NOTE — Progress Notes (Signed)
Location:   SNF [facility] Room Number: 37 Place of Service:  SNF (31) Provider: Lennie Odor Carlson Belland NP  Vitaly Wanat X, NP  Patient Care Team: Jeramie Scogin X, NP as PCP - General (Internal Medicine) [name], MD as Consulting Physician (Urology) [name], MD as Consulting Physician (Neurology) [name], MD as Consulting Physician (Dermatology) [name], MD (Inactive) as Consulting Physician (Gastroenterology) [name], MD as Consulting Physician (Orthopedic Surgery) [name], MD as Consulting Physician (Cardiology) [name], MD as Consulting Physician (Ophthalmology) [name], MD as Referring Physician (Psychiatry) [name], MD as Consulting Physician (General Surgery) [name], MD as Consulting Physician (Orthopedic Surgery) Jones, [name], NP as Nurse Practitioner (Family Medicine)  Extended Emergency Contact Information Primary Emergency Contact: Millette,[name] Address: [address]          [place], [place] [address] [place] Phone: [phone] Relation: Spouse  Code Status:  DNR Goals of care: Advanced Directive information Advanced Directives 05/31/2018  Does Patient Have a Medical Advance Directive? Yes  Type of Paramedic of [place];Out of facility DNR (pink MOST or yellow form);Living will  Does patient want to make changes to medical advance directive? No - Patient declined  Copy of [place] in Chart? Yes - validated most recent copy scanned in chart (See row information)  Pre-existing out of facility DNR order (yellow form or pink MOST form) Yellow form placed in chart (order not valid for inpatient use);Pink MOST form placed in chart (order not valid for inpatient use)     Chief Complaint  Patient presents with  . Acute Visit    red itching spots on R+L forearms.     HPI:  Pt is a 85 y.o. male seen today for  an acute visit for red itching spots on R+L fore arms x 1 day. No new garment/bedding, no new cleansing or cosmetic products. The patient admitted being outside yesterday. Hx of allergic rhinitis, on Loratadine 10mg  qd.    Past Medical History:  Diagnosis Date  . Acute urinary retention 01/28/2013  . AKI (acute kidney injury) ([place]) 01/26/2013  . Altered mental status 09/09/2013  . Anemia    B12 deficiency  . ANXIETY 06/07/2007   Qualifier: Diagnosis of  By: Talbert Cage CMA ([place]), June    . ARTHRITIS 06/07/2007   Qualifier: Diagnosis of  By: Talbert Cage CMA ([place]), June    . Balance problems 04/05/2011  . Benign essential tremor   . Benign fibroma of prostate 10/11/2011  . Bilateral inguinal hernia 10/09/2012  . Bilateral leg edema 09/13/2012  . Bladder retention 02/05/2013  . Bradycardia, sinus 08/02/2012  . CAROTID ARTERY DISEASE 03/31/2007   Qualifier: Diagnosis of  By: Linna Darner MD, Gwyndolyn Saxon    . Carotid stenosis    s/p L CEA  . Cataract, nuclear 03/21/2014  . 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 Cervical spinal stenosis   . Cervical stenosis of spine   . Critical lower limb ischemia   . Degeneration macular 11/02/2011  . Depression    Dr Albertine Patricia  . Difficulty in walking(719.7) 04/05/2011  . ESOPHAGITIS 08/06/2002   Qualifier: Diagnosis of  By: Talbert Cage CMA ([place]), June    . Essential and other specified forms of tremor 04/18/2013  . Fall at home 01/06/2017   left hip pain  . Falls   . Family history of colon cancer 07/24/2012  .  Fecal impaction ([place]) 10/11/2012  . Femoral hernia, bilateral s/p lap repair 01/16/2013 01/16/2013  . Glaucoma, compensated 03/21/2014  . H/O cardiovascular stress test    a. 02/2003 -> no ischemia/infarct.  Marland Kitchen HAMMER TOE 04/23/2010   Qualifier: Diagnosis of  By: Linna Darner MD, Gwyndolyn Saxon    . Hematoma of leg   . Hip hematoma, right 09/09/2013  . History of shingles 2009  . HTN (hypertension) 08/22/2012  . Hx of echocardiogram    Echocardiogram 08/01/12: Mild LVH, EF 55-60%, normal  wall motion.  . Hydrocele 10/11/2011  . Hyperlipidemia   . 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 Hypothyroidism   . Macular degeneration disease   . Macular edema   . Migraines    Dr Jannifer Franklin  . MORTON'S NEUROMA, LEFT 08/22/2007   Qualifier: Diagnosis of  By: Linna Darner MD, Gwyndolyn Saxon    . PAD (peripheral artery disease) ([place])   . Peripheral neuropathy   . Personal history of colonic polyps 01/05/2013   2014 2 mm adenomatous polyp   . PREMATURE ATRIAL CONTRACTIONS 03/14/2006   Qualifier: Diagnosis of  By: Linna Darner MD, Gwendolyn Lima 12/16/2011  . SPINAL STENOSIS 03/14/2006   Qualifier: Diagnosis of  By: Linna Darner MD, William   Cervical spine    . Testosterone deficiency    Dr Lawerance Bach, [facility]  . Unspecified vitamin D deficiency 08/06/2012  . Urge incontinence 10/09/2012  . Urinary tract infection, site not specified 02/13/2013  . Weakness 08/02/2012   Past Surgical History:  Procedure Laterality Date  . APPENDECTOMY  1941  . BREAST CYST EXCISION Left 01/16/2013   Procedure: MASS EXCISION AXILLA;  Surgeon: Adin Hector, MD;  Location: [facility];  Service: General;  Laterality: Left;  . CAROTID ENDARTERECTOMY  2005    L  . CATARACT EXTRACTION Right   . COLONOSCOPY  2014   Dr. Deatra Ina  . EYE SURGERY Right 02/2012   retina  . INGUINAL HERNIA REPAIR Bilateral 01/16/2013   Procedure: LAPAROSCOPIC BILATERAL INGUINAL DIRECT AND INDIRECT, FEMORAL, AND OBTURATOR HERNIAS;  Surgeon: Adin Hector, MD;  Location: [facility];  Service: General;  Laterality: Bilateral;  . INSERTION OF MESH N/A 01/16/2013   Procedure: INSERTION OF MESH;  Surgeon: Adin Hector, MD;  Location: [facility];  Service: General;  Laterality: N/A;  . LUMBAR LAMINECTOMY    . TONSILLECTOMY AND ADENOIDECTOMY    . TOTAL HIP ARTHROPLASTY  2005   L    Allergies  Allergen Reactions  . Latex Rash  . Tape Rash    Paper tape is ok to use     Allergies as of 06/13/2018      Reactions   Latex Rash   Tape Rash   Paper tape is ok to  use      Medication List       Accurate as of June 13, 2018  2:49 PM. Always use your most recent med list.        acetaminophen 500 MG tablet Commonly known as:  TYLENOL Take 1,000 mg by mouth 3 (three) times daily.   amLODipine 10 MG tablet Commonly known as:  NORVASC Take 10 mg by mouth daily.   aspirin 81 MG tablet Take 81 mg by mouth every morning.   atorvastatin 10 MG tablet Commonly known as:  LIPITOR Take 10 mg by mouth daily.   B-complex with vitamin C tablet Take 1 tablet by mouth daily.   bimatoprost 0.01 % Soln Commonly known as:  LUMIGAN Place 1  drop into both eyes at bedtime.   buPROPion 300 MG 24 hr tablet Commonly known as:  WELLBUTRIN XL Take 300 mg by mouth daily. per psychiatry recommendation   clonazePAM 0.5 MG tablet Commonly known as:  KLONOPIN Take 1 tablet (0.5 mg total) by mouth at bedtime.   erythromycin ophthalmic ointment Place 1 application into both eyes at bedtime.   fluticasone 0.05 % cream Commonly known as:  CUTIVATE Apply 1 application topically 2 (two) times daily as needed (irritation).   ICAPS PO Take 1 tablet by mouth daily.   ketoconazole 2 % cream Commonly known as:  NIZORAL Apply 1 application topically 2 (two) times daily as needed for irritation.   Krill Oil 500 MG Caps Take 500 mg by mouth every morning.   levothyroxine 50 MCG tablet Commonly known as:  SYNTHROID Take 50 mcg by mouth daily before breakfast. Take one tablet daily except on Sunday's take 1/2 tablet.   linaclotide 145 MCG Caps capsule Commonly known as:  LINZESS Take 1 capsule (145 mcg total) by mouth daily before breakfast.   loratadine 10 MG tablet Commonly known as:  CLARITIN Take 10 mg by mouth daily.   Melatonin 3 MG Tabs Take 1 tablet by mouth at bedtime.   mirtazapine 30 MG tablet Commonly known as:  REMERON Take 60 mg by mouth at bedtime.   nystatin powder Generic drug:  nystatin Apply topically 2 (two) times daily. Apply  to left buttocks   polyethylene glycol 17 g packet Commonly known as:  MIRALAX / GLYCOLAX Take 17 g by mouth daily. Hold for loose stool   sertraline 100 MG tablet Commonly known as:  ZOLOFT Take 200 mg by mouth every morning.   Systane 0.4-0.3 % Soln Generic drug:  Polyethyl Glycol-Propyl Glycol Apply 1 drop to eye 4 (four) times daily.   tamsulosin 0.4 MG Caps capsule Commonly known as:  FLOMAX TAKE 1 CAPSULE (0.4 MG TOTAL) BY MOUTH DAILY.   topiramate 25 MG tablet Commonly known as:  TOPAMAX TAKE 1 TABLET BY MOUTH EVERY MORNING AND 2 TABLETS BY MOUTH EVERY EVENING   Vitamin D3 25 MCG (1000 UT) Caps Take 1,000 Units by mouth daily.      ROS was provided with assistance of staff.  Review of Systems  Constitutional: Negative for activity change, appetite change, chills, diaphoresis, fatigue and fever.  HENT: Positive for hearing loss. Negative for congestion, postnasal drip, rhinorrhea, sinus pressure, sinus pain, sneezing, sore throat and voice change.   Respiratory: Negative for cough, shortness of breath and wheezing.   Cardiovascular: Negative for chest pain, palpitations and leg swelling.  Skin: Positive for rash.       4-5 scatted red itching spots < pencil eraser R+L forearm.   Neurological: Positive for tremors. Negative for dizziness, speech difficulty, weakness and headaches.       Memory lapses. Fine resting tremor in fingers-not disabling.   Psychiatric/Behavioral: Negative for agitation, behavioral problems, hallucinations and sleep disturbance. The patient is not nervous/anxious.     Immunization History  Administered Date(s) Administered  . DTaP 11/05/2005  . Hepatitis B 10-09-1932  . Influenza Split 11/12/2011, 11/29/2013  . Influenza Whole 10/30/2007, 11/12/2008, 11/26/2009  . Influenza, High Dose Seasonal PF 12/31/2015, 11/24/2016  . Influenza,inj,quad, With Preservative 10/16/2016  . Influenza-Unspecified 11/28/2017  . PPD Test 06/23/2011  .  Pneumococcal Conjugate-13 01/14/2016  . Pneumococcal Polysaccharide-23 02/16/1999  . Tdap 01/13/2017  . Varicella 11/09/2005  . Zoster 08/30/2006   Pertinent  Health Maintenance Due  Topic Date Due  . INFLUENZA VACCINE  09/16/2018  . PNA vac Low Risk Adult  Completed   Fall Risk  10/28/2017 10/27/2016 06/09/2016 02/03/2016 10/28/2015  Falls in the past year? Yes Yes Yes Yes Yes  Number falls in past yr: 1 2 or more 2 or more 2 or more 2 or more  Comment - - - 2 falls in last 2 months -  Injury with Fall? Yes Yes No Yes No  Comment - L hip - scraps and bruises -  Risk Factor Category  - - - - -  Risk for fall due to : - - - - Impaired balance/gait  Follow up - - - - -   Functional Status Survey:    Vitals:   06/13/18 1425  BP: 124/80  Pulse: 66  Resp: 14  Temp: (!) 97.3 F (36.3 C)  SpO2: 98%   There is no height or weight on file to calculate BMI. Physical Exam Constitutional:      Appearance: Normal appearance.  HENT:     Head: Normocephalic and atraumatic.     Nose: Nose normal.     Mouth/Throat:     Mouth: Mucous membranes are moist.  Eyes:     Extraocular Movements: Extraocular movements intact.     Conjunctiva/sclera: Conjunctivae normal.     Pupils: Pupils are equal, round, and reactive to light.     Comments: Left eye blind.   Neck:     Musculoskeletal: Normal range of motion and neck supple.  Cardiovascular:     Rate and Rhythm: Normal rate and regular rhythm.     Heart sounds: No murmur.  Pulmonary:     Effort: Pulmonary effort is normal.     Breath sounds: No wheezing, rhonchi or rales.  Musculoskeletal:     Comments: W/c for mobility.   Skin:    General: Skin is warm and dry.     Findings: Rash present.     Comments: 4-5 scatted red itching spots < pencil eraser R+L forearm.    Neurological:     General: No focal deficit present.     Mental Status: He is alert. Mental status is at baseline.     Cranial Nerves: No cranial nerve deficit.      Motor: No weakness.     Coordination: Coordination normal.     Gait: Gait abnormal.     Comments: Oriented to person and place.   Psychiatric:        Mood and Affect: Mood normal.        Behavior: Behavior normal.     Labs reviewed: Recent Labs    09/05/17  03/16/18 03/23/18 04/07/18  NA 142   < > 144 139 142  K 4.4   < > 4.5 4.2 4.3  CL  --   --  111  --   --   CO2  --   --  27  --   --   BUN  --    < > 38* 40* 34*  CREATININE 1.29   < > 1.4* 1.4* 1.2  CALCIUM 8.5  --  9.0  --   --    < > = values in this interval not displayed.   Recent Labs    09/05/17  03/16/18 03/23/18 04/07/18  AST 15   < > 15 19 20   ALT 14   < > 13 17 18   ALKPHOS 40   < > 36 40 42  BILITOT 39.0  0.4  --   --   --   --   PROT 5.5  --  5.5  --   --   ALBUMIN 3.4  --  3.3  --   --    < > = values in this interval not displayed.   Recent Labs    09/13/17  03/16/18 03/23/18 04/07/18  WBC 5.0   < > 3.7 6.7 5.5  NEUTROABS 3,235  --   --   --   --   HGB 15.5   < > 14.2 13.9 14.2  HCT 48   < > 43 42 44  PLT 144*   < > 122* 110* 159   < > = values in this interval not displayed.   Lab Results  Component Value Date   TSH 2.37 04/07/2018   Lab Results  Component Value Date   HGBA1C 5 01/26/2018   Lab Results  Component Value Date   CHOL 176 01/05/2018   HDL 45 01/05/2018   LDLCALC 112 01/05/2018   LDLDIRECT 132.5 09/08/2012   TRIG 93 01/05/2018   CHOLHDL 2.7 11/15/2016    Significant Diagnostic Results in last 30 days:  No results found.  Assessment/Plan Rash Fore arms R+L, presumed bug bites, will apply Benadryl extra strength itch stopping Gel 4x/day x 5 days.   Allergic rhinitis Stable, continue Claritin 10mg  qd.     Family/ staff Communication: plan of care reviewed with the patient and charge nurse.   Labs/tests ordered: none  Time spend 25 minutes

## 2018-06-13 NOTE — Assessment & Plan Note (Signed)
Stable, continue Claritin 10mg qd.  

## 2018-06-22 ENCOUNTER — Encounter: Payer: Self-pay | Admitting: Internal Medicine

## 2018-06-22 ENCOUNTER — Non-Acute Institutional Stay (SKILLED_NURSING_FACILITY): Payer: Medicare Other | Admitting: Internal Medicine

## 2018-06-22 DIAGNOSIS — E039 Hypothyroidism, unspecified: Secondary | ICD-10-CM | POA: Diagnosis not present

## 2018-06-22 DIAGNOSIS — I739 Peripheral vascular disease, unspecified: Secondary | ICD-10-CM

## 2018-06-22 DIAGNOSIS — K5909 Other constipation: Secondary | ICD-10-CM

## 2018-06-22 DIAGNOSIS — I10 Essential (primary) hypertension: Secondary | ICD-10-CM | POA: Diagnosis not present

## 2018-06-22 NOTE — Progress Notes (Signed)
Location:  [facility] Room Number: 37 Place of Service:  SNF (31) Provider:Geniece Akers Rene Kocher, MD  [name], MD  Patient Care Team: [name], MD as PCP - General (Internal Medicine) [name], MD as Consulting Physician (Urology) [name], MD as Consulting Physician (Neurology) [name], MD as Consulting Physician (Dermatology) [name], MD (Inactive) as Consulting Physician (Gastroenterology) [name], MD as Consulting Physician (Orthopedic Surgery) [name], MD as Consulting Physician (Cardiology) [name], MD as Consulting Physician (Ophthalmology) [name], MD as Referring Physician (Psychiatry) [name], MD as Consulting Physician (General Surgery) [name], MD as Consulting Physician (Orthopedic Surgery) [name], [name], NP as Nurse Practitioner (Family Medicine) Mast, Man X, NP as Nurse Practitioner (Internal Medicine)  Extended Emergency Contact Information Primary Emergency Contact: Cambria,[name] Address: [address]          [place], [place] [address] [place] Phone: [phone] Relation: Spouse  Code Status: Full Code Goals of care: Advanced Directive information Advanced Directives 06/22/2018  Does Patient Have a Medical Advance Directive? Yes  Type of Paramedic of [place];Out of facility DNR (pink MOST or yellow form);Living will  Does patient want to make changes to medical advance directive? No - Patient declined  Copy of [place] in Chart? Yes - validated most recent copy scanned in chart (See row information)  Pre-existing out of facility DNR order (yellow form or pink MOST form) Yellow form placed in chart (order not valid for inpatient use);Pink MOST form placed in chart (order not valid for inpatient use)     Chief Complaint  Patient presents with  . Medical Management  of Chronic Issues    routine visit     HPI:  Pt is a 85 y.o. male seen today for medical management of chronic diseases.    Patient is Long term Resident of SNF. He has been in this room for more then 1 year.His wife lives in [place]. He has h/o HTN, Hyperlipidemia, PAD, BPH , CKD stage 2, Hypothyroidism, and Major Depression Also Has h/o Cervial Stenosis with Compressive Myelopathy, BPH He walks with the walker but has been mostly using his Wheelchair. Patient did not have any acute issues. No New Nursing issues His Wife lives in [place] and they both have been able to see each other outside the facility. Weight is has stabilized now Appetite is Fair No Cough,Chest pain or SOB   Past Medical History:  Diagnosis Date  . Acute urinary retention 01/28/2013  . AKI (acute kidney injury) ([place]) 01/26/2013  . Altered mental status 09/09/2013  . Anemia    B12 deficiency  . ANXIETY 06/07/2007   Qualifier: Diagnosis of  By: Talbert Cage CMA ([place]), June    . ARTHRITIS 06/07/2007   Qualifier: Diagnosis of  By: Talbert Cage CMA ([place]), June    . Balance problems 04/05/2011  . Benign essential tremor   . Benign fibroma of prostate 10/11/2011  . Bilateral inguinal hernia 10/09/2012  . Bilateral leg edema 09/13/2012  . Bladder retention 02/05/2013  . Bradycardia, sinus 08/02/2012  . CAROTID ARTERY DISEASE 03/31/2007   Qualifier: Diagnosis of  By: Linna Darner MD, Gwyndolyn Saxon    . Carotid stenosis    s/p L CEA  . Cataract, nuclear 03/21/2014  . 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 Cervical spinal stenosis   . Cervical stenosis of spine   .  Critical lower limb ischemia   . Degeneration macular 11/02/2011  . Depression    Dr Albertine Patricia  . Difficulty in walking(719.7) 04/05/2011  . ESOPHAGITIS 08/06/2002   Qualifier: Diagnosis of  By: Talbert Cage CMA ([place]), June    . Essential and other specified forms of tremor 04/18/2013  . Fall at home 01/06/2017   left hip pain  . Falls   . Family history of colon cancer 07/24/2012  . Fecal  impaction ([place]) 10/11/2012  . Femoral hernia, bilateral s/p lap repair 01/16/2013 01/16/2013  . Glaucoma, compensated 03/21/2014  . H/O cardiovascular stress test    a. 02/2003 -> no ischemia/infarct.  Marland Kitchen HAMMER TOE 04/23/2010   Qualifier: Diagnosis of  By: Linna Darner MD, Gwyndolyn Saxon    . Hematoma of leg   . Hip hematoma, right 09/09/2013  . History of shingles 2009  . HTN (hypertension) 08/22/2012  . Hx of echocardiogram    Echocardiogram 08/01/12: Mild LVH, EF 55-60%, normal wall motion.  . Hydrocele 10/11/2011  . Hyperlipidemia   . 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 Hypothyroidism   . Macular degeneration disease   . Macular edema   . Migraines    Dr Jannifer Franklin  . MORTON'S NEUROMA, LEFT 08/22/2007   Qualifier: Diagnosis of  By: Linna Darner MD, Gwyndolyn Saxon    . PAD (peripheral artery disease) ([place])   . Peripheral neuropathy   . Personal history of colonic polyps 01/05/2013   2014 2 mm adenomatous polyp   . PREMATURE ATRIAL CONTRACTIONS 03/14/2006   Qualifier: Diagnosis of  By: Linna Darner MD, Gwendolyn Lima 12/16/2011  . SPINAL STENOSIS 03/14/2006   Qualifier: Diagnosis of  By: Linna Darner MD, William   Cervical spine    . Testosterone deficiency    Dr Lawerance Bach, [facility]  . Unspecified vitamin D deficiency 08/06/2012  . Urge incontinence 10/09/2012  . Urinary tract infection, site not specified 02/13/2013  . Weakness 08/02/2012   Past Surgical History:  Procedure Laterality Date  . APPENDECTOMY  1941  . BREAST CYST EXCISION Left 01/16/2013   Procedure: MASS EXCISION AXILLA;  Surgeon: Adin Hector, MD;  Location: [facility];  Service: General;  Laterality: Left;  . CAROTID ENDARTERECTOMY  2005    L  . CATARACT EXTRACTION Right   . COLONOSCOPY  2014   Dr. Deatra Ina  . EYE SURGERY Right 02/2012   retina  . INGUINAL HERNIA REPAIR Bilateral 01/16/2013   Procedure: LAPAROSCOPIC BILATERAL INGUINAL DIRECT AND INDIRECT, FEMORAL, AND OBTURATOR HERNIAS;  Surgeon: Adin Hector, MD;  Location: [facility];  Service: General;   Laterality: Bilateral;  . INSERTION OF MESH N/A 01/16/2013   Procedure: INSERTION OF MESH;  Surgeon: Adin Hector, MD;  Location: [facility];  Service: General;  Laterality: N/A;  . LUMBAR LAMINECTOMY    . TONSILLECTOMY AND ADENOIDECTOMY    . TOTAL HIP ARTHROPLASTY  2005   L    Allergies  Allergen Reactions  . Latex Rash  . Tape Rash    Paper tape is ok to use     Outpatient Encounter Medications as of 06/22/2018  Medication Sig  . acetaminophen (TYLENOL) 500 MG tablet Take 1,000 mg by mouth 3 (three) times daily.   Marland Kitchen amLODipine (NORVASC) 10 MG tablet Take 10 mg by mouth daily.  Marland Kitchen aspirin 81 MG tablet Take 81 mg by mouth every morning.   Marland Kitchen atorvastatin (LIPITOR) 10 MG tablet Take 10 mg by mouth daily.  . B Complex-C (B-COMPLEX WITH VITAMIN C) tablet Take 1  tablet by mouth daily.  . bimatoprost (LUMIGAN) 0.01 % SOLN Place 1 drop into both eyes at bedtime.  Marland Kitchen buPROPion (WELLBUTRIN XL) 300 MG 24 hr tablet Take 300 mg by mouth daily. per psychiatry recommendation  . Cholecalciferol (VITAMIN D3) 1000 units CAPS Take 1,000 Units by mouth daily.   . clonazePAM (KLONOPIN) 0.5 MG tablet Take 1 tablet (0.5 mg total) by mouth at bedtime.  Marland Kitchen erythromycin ophthalmic ointment Place 1 application into both eyes at bedtime.   . fluticasone (CUTIVATE) 0.05 % cream Apply 1 application topically 2 (two) times daily as needed (irritation).   . Fluticasone Propionate 0.05 % LOTN Apply topically 2 (two) times daily as needed. Coccyx bid prn  . ketoconazole (NIZORAL) 2 % cream Apply 1 application topically 2 (two) times daily as needed for irritation.   Javier Docker Oil 500 MG CAPS Take 500 mg by mouth every morning.   Marland Kitchen levothyroxine (SYNTHROID, LEVOTHROID) 50 MCG tablet Take 50 mcg by mouth daily before breakfast. Take one tablet daily except on Sunday's take 1/2 tablet.  . linaclotide (LINZESS) 145 MCG CAPS capsule Take 1 capsule (145 mcg total) by mouth daily before breakfast.  . loratadine (CLARITIN) 10 MG  tablet Take 10 mg by mouth daily.  . Melatonin 3 MG TABS Take 1 tablet by mouth at bedtime.  . mirtazapine (REMERON) 30 MG tablet Take 60 mg by mouth at bedtime.  . Multiple Vitamins-Minerals (ICAPS PO) Take 1 tablet by mouth daily.  Vladimir Faster Glycol-Propyl Glycol (SYSTANE) 0.4-0.3 % SOLN Apply 1 drop to eye 4 (four) times daily.  . polyethylene glycol (MIRALAX / GLYCOLAX) packet Take 17 g by mouth daily. Hold for loose stool  . sertraline (ZOLOFT) 100 MG tablet Take 200 mg by mouth every morning.   . tamsulosin (FLOMAX) 0.4 MG CAPS capsule TAKE 1 CAPSULE (0.4 MG TOTAL) BY MOUTH DAILY.  Marland Kitchen topiramate (TOPAMAX) 25 MG tablet TAKE 1 TABLET BY MOUTH EVERY MORNING AND 2 TABLETS BY MOUTH EVERY EVENING  . [DISCONTINUED] nystatin (NYSTATIN) powder Apply topically 2 (two) times daily. Apply to left buttocks   No facility-administered encounter medications on file as of 06/22/2018.     Review of Systems  Review of Systems  Constitutional: Negative for activity change, appetite change, chills, diaphoresis, fatigue and fever.  HENT: Negative for mouth sores, postnasal drip, rhinorrhea, sinus pain and sore throat.   Respiratory: Negative for apnea, cough, chest tightness, shortness of breath and wheezing.   Cardiovascular: Negative for chest pain, palpitations and leg swelling.  Gastrointestinal: Negative for abdominal distention, abdominal pain, constipation, diarrhea, nausea and vomiting.  Genitourinary: Negative for dysuria and frequency.  Musculoskeletal: Negative for arthralgias, joint swelling and myalgias.  Skin: Negative for rash.  Neurological: Negative for dizziness, syncope, weakness, light-headedness and numbness.  Psychiatric/Behavioral: Negative for behavioral problems, confusion and sleep disturbance.     Immunization History  Administered Date(s) Administered  . DTaP 11/05/2005  . Hepatitis B [birth year]  . Influenza Split 11/12/2011, 11/29/2013  . Influenza Whole 10/30/2007,  11/12/2008, 11/26/2009  . Influenza, High Dose Seasonal PF 12/31/2015, 11/24/2016  . Influenza,inj,quad, With Preservative 10/16/2016  . Influenza-Unspecified 11/28/2017  . PPD Test 06/23/2011  . Pneumococcal Conjugate-13 01/14/2016  . Pneumococcal Polysaccharide-23 02/16/1999  . Tdap 01/13/2017  . Varicella 11/09/2005  . Zoster 08/30/2006   Pertinent  Health Maintenance Due  Topic Date Due  . INFLUENZA VACCINE  09/16/2018  . PNA vac Low Risk Adult  Completed   Fall Risk  10/28/2017 10/27/2016 06/09/2016  02/03/2016 10/28/2015  Falls in the past year? Yes Yes Yes Yes Yes  Number falls in past yr: 1 2 or more 2 or more 2 or more 2 or more  Comment - - - 2 falls in last 2 months -  Injury with Fall? Yes Yes No Yes No  Comment - L hip - scraps and bruises -  Risk Factor Category  - - - - -  Risk for fall due to : - - - - Impaired balance/gait  Follow up - - - - -   Functional Status Survey:    Vitals:   06/22/18 1003  BP: 117/61  Pulse: 65  Resp: 17  Temp: (!) 97.2 F (36.2 C)  SpO2: 95%  Weight: 149 lb 3.2 oz (67.7 kg)  Height: 6' (1.829 m)   Body mass index is 20.24 kg/m. Physical Exam  Constitutional: Oriented to person, place, and time. Well-developed and well-nourished.  HENT:  Head: Normocephalic.  Mouth/Throat: Oropharynx is clear and moist.  Eyes: Pupils are equal, round, and reactive to light.  Neck: Neck supple.  Cardiovascular: Normal rate and normal heart sounds.  No murmur heard. Pulmonary/Chest: Effort normal and breath sounds normal. No respiratory distress. No wheezes. She has no rales.  Abdominal: Soft. Bowel sounds are normal. No distension. There is no tenderness. There is no rebound.  Musculoskeletal: No edema.  Lymphadenopathy: none Neurological: Alert and oriented to person, place, and time.  Skin: Skin is warm and dry.  Psychiatric: Normal mood and affect. Behavior is normal. Thought content normal.    Labs reviewed: Recent Labs    09/05/17   03/16/18 03/23/18 04/07/18  NA 142   < > 144 139 142  K 4.4   < > 4.5 4.2 4.3  CL  --   --  111  --   --   CO2  --   --  27  --   --   BUN  --    < > 38* 40* 34*  CREATININE 1.29   < > 1.4* 1.4* 1.2  CALCIUM 8.5  --  9.0  --   --    < > = values in this interval not displayed.   Recent Labs    09/05/17  03/16/18 03/23/18 04/07/18  AST 15   < > 15 19 20   ALT 14   < > 13 17 18   ALKPHOS 40   < > 36 40 42  BILITOT 39.0  0.4  --   --   --   --   PROT 5.5  --  5.5  --   --   ALBUMIN 3.4  --  3.3  --   --    < > = values in this interval not displayed.   Recent Labs    09/13/17  03/16/18 03/23/18 04/07/18  WBC 5.0   < > 3.7 6.7 5.5  NEUTROABS 3,235  --   --   --   --   HGB 15.5   < > 14.2 13.9 14.2  HCT 48   < > 43 42 44  PLT 144*   < > 122* 110* 159   < > = values in this interval not displayed.   Lab Results  Component Value Date   TSH 2.37 04/07/2018   Lab Results  Component Value Date   HGBA1C 5 01/26/2018   Lab Results  Component Value Date   CHOL 176 01/05/2018   HDL 45 01/05/2018   LDLCALC 112  01/05/2018   LDLDIRECT 132.5 09/08/2012   TRIG 93 01/05/2018   CHOLHDL 2.7 11/15/2016    Significant Diagnostic Results in last 30 days:  No results found.  Assessment/Plan Essential Hypertension BP stable on Norvasc   Hypothyroidism TSH is normal in 02/20 Chronic constipation On Linzess Major Depression On Zoloft and Remeron, Wellbutrin and Klonopin Follows with Psychiatrist  [place] Conjuctivits On Erythromycin ointment per Ophthalmologist Hyperlipidemia LDL is more then 100 and with his H/o Chronic Microvascular Diasease Will incrase the Statin to 20 mg BPH Symptoms Stable on Flomax Essential Tremors On Topamax   Family/ staff Communication:  Labs/tests ordered:  Total time spent in this patient care encounter was  _25  minutes; greater than 50% of the visit spent counseling patient and staff, reviewing records , Labs and coordinating care for  problems addressed at this encounter.

## 2018-07-04 ENCOUNTER — Encounter: Payer: Self-pay | Admitting: Nurse Practitioner

## 2018-07-04 ENCOUNTER — Non-Acute Institutional Stay (SKILLED_NURSING_FACILITY): Payer: Medicare Other | Admitting: Nurse Practitioner

## 2018-07-04 DIAGNOSIS — I781 Nevus, non-neoplastic: Secondary | ICD-10-CM

## 2018-07-04 DIAGNOSIS — J309 Allergic rhinitis, unspecified: Secondary | ICD-10-CM

## 2018-07-04 DIAGNOSIS — W57XXXA Bitten or stung by nonvenomous insect and other nonvenomous arthropods, initial encounter: Secondary | ICD-10-CM | POA: Diagnosis not present

## 2018-07-04 NOTE — Assessment & Plan Note (Signed)
Stable, continue Claritin 10mg qd.  

## 2018-07-04 NOTE — Progress Notes (Signed)
Location:   SNF [place] Room Number: 37 Place of Service:  SNF (31) Provider: [facility] Farron Lafond NP  [name], MD  Patient Care Team: [name], MD as PCP - General (Internal Medicine) [name], MD as Consulting Physician (Urology) [name], MD as Consulting Physician (Neurology) [name], MD as Consulting Physician (Dermatology) [name], MD (Inactive) as Consulting Physician (Gastroenterology) [name], MD as Consulting Physician (Orthopedic Surgery) [name], MD as Consulting Physician (Cardiology) [name], MD as Consulting Physician (Ophthalmology) [name], MD as Referring Physician (Psychiatry) [name], MD as Consulting Physician (General Surgery) [name], MD as Consulting Physician (Orthopedic Surgery) [name], [name], NP as Nurse Practitioner (Family Medicine) Tadeo Besecker X, NP as Nurse Practitioner (Internal Medicine)  Extended Emergency Contact Information Primary Emergency Contact: Bouton,[name] Address: [address]          [place], [place] [address] [place] Phone: [phone] Relation: Spouse  Code Status:  DNR Goals of care: Advanced Directive information Advanced Directives 06/22/2018  Does Patient Have a Medical Advance Directive? Yes  Type of Paramedic of [place];Out of facility DNR (pink MOST or yellow form);Living will  Does patient want to make changes to medical advance directive? No - Patient declined  Copy of [employer] in Chart? Yes - validated most recent copy scanned in chart (See row information)  Pre-existing out of facility DNR order (yellow form or pink MOST form) Yellow form placed in chart (order not valid for inpatient use);Pink MOST form placed in chart (order not valid for inpatient use)     Chief Complaint  Patient presents with  . Acute Visit    red bumps R+L thighs     HPI:  Pt is a 85 y.o. male seen today for an acute visit for small red raised bumps right thigh about 5-7 in a group, 3-5 in the group left thigh, not itching or pain, onset and duration are unknown. HPI was provided with assistance of staff. He resides in [facility] for safety and care assistance. Allergic rhinitis, stable on Claritin 10mg  qd.    Past Medical History:  Diagnosis Date  . Acute urinary retention 01/28/2013  . AKI (acute kidney injury) ([place]) 01/26/2013  . Altered mental status 09/09/2013  . Anemia    B12 deficiency  . ANXIETY 06/07/2007   Qualifier: Diagnosis of  By: Talbert Cage CMA ([place]), June    . ARTHRITIS 06/07/2007   Qualifier: Diagnosis of  By: Talbert Cage CMA ([place]), June    . Balance problems 04/05/2011  . Benign essential tremor   . Benign fibroma of prostate 10/11/2011  . Bilateral inguinal hernia 10/09/2012  . Bilateral leg edema 09/13/2012  . Bladder retention 02/05/2013  . Bradycardia, sinus 08/02/2012  . CAROTID ARTERY DISEASE 03/31/2007   Qualifier: Diagnosis of  By: Linna Darner MD, Gwyndolyn Saxon    . Carotid stenosis    s/p L CEA  . Cataract, nuclear 03/21/2014  . 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 Cervical spinal stenosis   . Cervical stenosis of spine   . Critical lower limb ischemia   . Degeneration macular 11/02/2011  . Depression    Dr Albertine Patricia  . Difficulty in walking(719.7) 04/05/2011  . ESOPHAGITIS 08/06/2002   Qualifier: Diagnosis of  By: Talbert Cage CMA ([place]), June    . Essential and other specified forms of tremor 04/18/2013  .  Fall at home 01/06/2017   left hip pain  . Falls   . Family history of colon cancer 07/24/2012  . Fecal impaction ([place]) 10/11/2012  . Femoral hernia, bilateral s/p lap repair 01/16/2013 01/16/2013  . Glaucoma, compensated 03/21/2014  . H/O cardiovascular stress test    a. 02/2003 -> no ischemia/infarct.  Marland Kitchen HAMMER TOE 04/23/2010   Qualifier: Diagnosis of  By: Linna Darner MD, Gwyndolyn Saxon    . Hematoma of leg   . Hip hematoma, right 09/09/2013  . History  of shingles 2009  . HTN (hypertension) 08/22/2012  . Hx of echocardiogram    Echocardiogram 08/01/12: Mild LVH, EF 55-60%, normal wall motion.  . Hydrocele 10/11/2011  . Hyperlipidemia   . 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 Hypothyroidism   . Macular degeneration disease   . Macular edema   . Migraines    Dr Jannifer Franklin  . MORTON'S NEUROMA, LEFT 08/22/2007   Qualifier: Diagnosis of  By: Linna Darner MD, Gwyndolyn Saxon    . PAD (peripheral artery disease) ([place])   . Peripheral neuropathy   . Personal history of colonic polyps 01/05/2013   2014 2 mm adenomatous polyp   . PREMATURE ATRIAL CONTRACTIONS 03/14/2006   Qualifier: Diagnosis of  By: Linna Darner MD, Gwendolyn Lima 12/16/2011  . SPINAL STENOSIS 03/14/2006   Qualifier: Diagnosis of  By: Linna Darner MD, William   Cervical spine    . Testosterone deficiency    Dr Lawerance Bach, [facility]  . Unspecified vitamin D deficiency 08/06/2012  . Urge incontinence 10/09/2012  . Urinary tract infection, site not specified 02/13/2013  . Weakness 08/02/2012   Past Surgical History:  Procedure Laterality Date  . APPENDECTOMY  1941  . BREAST CYST EXCISION Left 01/16/2013   Procedure: MASS EXCISION AXILLA;  Surgeon: Adin Hector, MD;  Location: [place];  Service: General;  Laterality: Left;  . CAROTID ENDARTERECTOMY  2005    L  . CATARACT EXTRACTION Right   . COLONOSCOPY  2014   Dr. Deatra Ina  . EYE SURGERY Right 02/2012   retina  . INGUINAL HERNIA REPAIR Bilateral 01/16/2013   Procedure: LAPAROSCOPIC BILATERAL INGUINAL DIRECT AND INDIRECT, FEMORAL, AND OBTURATOR HERNIAS;  Surgeon: Adin Hector, MD;  Location: [facility];  Service: General;  Laterality: Bilateral;  . INSERTION OF MESH N/A 01/16/2013   Procedure: INSERTION OF MESH;  Surgeon: Adin Hector, MD;  Location: [facility];  Service: General;  Laterality: N/A;  . LUMBAR LAMINECTOMY    . TONSILLECTOMY AND ADENOIDECTOMY    . TOTAL HIP ARTHROPLASTY  2005   L    Allergies  Allergen Reactions  . Latex Rash  . Tape  Rash    Paper tape is ok to use     Allergies as of 07/04/2018      Reactions   Latex Rash   Tape Rash   Paper tape is ok to use      Medication List       Accurate as of Jul 04, 2018  3:22 PM. If you have any questions, ask your nurse or doctor.        acetaminophen 500 MG tablet Commonly known as:  TYLENOL Take 1,000 mg by mouth 3 (three) times daily.   amLODipine 10 MG tablet Commonly known as:  NORVASC Take 10 mg by mouth daily.   aspirin 81 MG tablet Take 81 mg by mouth every morning.   atorvastatin 10 MG tablet Commonly known as:  LIPITOR Take 10 mg by mouth daily.  B-complex with vitamin C tablet Take 1 tablet by mouth daily.   bimatoprost 0.01 % Soln Commonly known as:  LUMIGAN Place 1 drop into both eyes at bedtime.   buPROPion 300 MG 24 hr tablet Commonly known as:  WELLBUTRIN XL Take 300 mg by mouth daily. per psychiatry recommendation   clonazePAM 0.5 MG tablet Commonly known as:  KLONOPIN Take 1 tablet (0.5 mg total) by mouth at bedtime.   erythromycin ophthalmic ointment Place 1 application into both eyes at bedtime.   Fluticasone Propionate 0.05 % Lotn Apply topically 2 (two) times daily as needed. Coccyx bid prn   fluticasone 0.05 % cream Commonly known as:  CUTIVATE Apply 1 application topically 2 (two) times daily as needed (irritation).   ICAPS PO Take 1 tablet by mouth daily.   ketoconazole 2 % cream Commonly known as:  NIZORAL Apply 1 application topically 2 (two) times daily as needed for irritation.   Krill Oil 500 MG Caps Take 500 mg by mouth every morning.   levothyroxine 50 MCG tablet Commonly known as:  SYNTHROID Take 50 mcg by mouth daily before breakfast. Take one tablet daily except on Sunday's take 1/2 tablet.   linaclotide 145 MCG Caps capsule Commonly known as:  LINZESS Take 1 capsule (145 mcg total) by mouth daily before breakfast.   loratadine 10 MG tablet Commonly known as:  CLARITIN Take 10 mg by mouth  daily.   Melatonin 3 MG Tabs Take 1 tablet by mouth at bedtime.   mirtazapine 30 MG tablet Commonly known as:  REMERON Take 60 mg by mouth at bedtime.   polyethylene glycol 17 g packet Commonly known as:  MIRALAX / GLYCOLAX Take 17 g by mouth daily. Hold for loose stool   sertraline 100 MG tablet Commonly known as:  ZOLOFT Take 200 mg by mouth every morning.   Systane 0.4-0.3 % Soln Generic drug:  Polyethyl Glycol-Propyl Glycol Apply 1 drop to eye 4 (four) times daily.   tamsulosin 0.4 MG Caps capsule Commonly known as:  FLOMAX TAKE 1 CAPSULE (0.4 MG TOTAL) BY MOUTH DAILY.   topiramate 25 MG tablet Commonly known as:  TOPAMAX TAKE 1 TABLET BY MOUTH EVERY MORNING AND 2 TABLETS BY MOUTH EVERY EVENING   Vitamin D3 25 MCG (1000 UT) Caps Take 1,000 Units by mouth daily.      ROS was provided with assistance of staff.  Review of Systems  Constitutional: Negative for activity change, appetite change, chills, diaphoresis, fatigue, fever and unexpected weight change.  HENT: Positive for hearing loss. Negative for congestion, rhinorrhea, sinus pressure, sinus pain, sore throat and voice change.   Eyes: Positive for visual disturbance.  Respiratory: Negative for cough, shortness of breath and wheezing.   Cardiovascular: Negative for chest pain, palpitations and leg swelling.  Skin: Positive for rash.  Neurological: Negative for dizziness, speech difficulty, weakness and headaches.       Memory lapses.   Psychiatric/Behavioral: Negative for agitation, behavioral problems, hallucinations and sleep disturbance. The patient is not nervous/anxious.     Immunization History  Administered Date(s) Administered  . DTaP 11/05/2005  . Hepatitis B 1932/03/01  . Influenza Split 11/12/2011, 11/29/2013  . Influenza Whole 10/30/2007, 11/12/2008, 11/26/2009  . Influenza, High Dose Seasonal PF 12/31/2015, 11/24/2016  . Influenza,inj,quad, With Preservative 10/16/2016  .  Influenza-Unspecified 11/28/2017  . PPD Test 06/23/2011  . Pneumococcal Conjugate-13 01/14/2016  . Pneumococcal Polysaccharide-23 02/16/1999  . Tdap 01/13/2017  . Varicella 11/09/2005  . Zoster 08/30/2006   Pertinent  Health Maintenance Due  Topic Date Due  . INFLUENZA VACCINE  09/16/2018  . PNA vac Low Risk Adult  Completed   Fall Risk  10/28/2017 10/27/2016 06/09/2016 02/03/2016 10/28/2015  Falls in the past year? Yes Yes Yes Yes Yes  Number falls in past yr: 1 2 or more 2 or more 2 or more 2 or more  Comment - - - 2 falls in last 2 months -  Injury with Fall? Yes Yes No Yes No  Comment - L hip - scraps and bruises -  Risk Factor Category  - - - - -  Risk for fall due to : - - - - Impaired balance/gait  Follow up - - - - -   Functional Status Survey:    Vitals:   07/04/18 1449  BP: 126/78  Pulse: (!) 56  Resp: 16  Temp: (!) 97.4 F (36.3 C)  SpO2: 97%   There is no height or weight on file to calculate BMI. Physical Exam Vitals signs and nursing note reviewed.  Constitutional:      General: He is not in acute distress.    Appearance: Normal appearance. He is not ill-appearing, toxic-appearing or diaphoretic.  HENT:     Head: Normocephalic and atraumatic.     Nose: Nose normal. No congestion or rhinorrhea.     Mouth/Throat:     Mouth: Mucous membranes are moist.  Eyes:     Conjunctiva/sclera: Conjunctivae normal.     Comments: Left eye is blind.   Neck:     Musculoskeletal: Normal range of motion and neck supple.  Cardiovascular:     Rate and Rhythm: Normal rate and regular rhythm.     Heart sounds: No murmur.  Pulmonary:     Effort: Pulmonary effort is normal.     Breath sounds: No wheezing or rales.  Chest:     Chest wall: No tenderness.  Musculoskeletal:     Right lower leg: No edema.     Left lower leg: No edema.     Comments: W/c for mobility.   Skin:    General: Skin is warm and dry.     Comments: small red raised bumps right thigh about 5-7 in a  group, 3-5 in the group left thigh, not itching or pain. 2-3 scaly patches remain in the right forearm-no long itching. Multiple cherry angioma seen in trunk and limbs.   Neurological:     General: No focal deficit present.     Mental Status: He is alert. Mental status is at baseline.     Cranial Nerves: No cranial nerve deficit.     Motor: No weakness.     Coordination: Coordination normal.     Gait: Gait abnormal.     Comments: Oriented to person and place.   Psychiatric:        Mood and Affect: Mood normal.        Behavior: Behavior normal.     Labs reviewed: Recent Labs    09/05/17  03/16/18 03/23/18 04/07/18  NA 142   < > 144 139 142  K 4.4   < > 4.5 4.2 4.3  CL  --   --  111  --   --   CO2  --   --  27  --   --   BUN  --    < > 38* 40* 34*  CREATININE 1.29   < > 1.4* 1.4* 1.2  CALCIUM 8.5  --  9.0  --   --    < > =  values in this interval not displayed.   Recent Labs    09/05/17  03/16/18 03/23/18 04/07/18  AST 15   < > 15 19 20   ALT 14   < > 13 17 18   ALKPHOS 40   < > 36 40 42  BILITOT 39.0  0.4  --   --   --   --   PROT 5.5  --  5.5  --   --   ALBUMIN 3.4  --  3.3  --   --    < > = values in this interval not displayed.   Recent Labs    09/13/17  03/16/18 03/23/18 04/07/18  WBC 5.0   < > 3.7 6.7 5.5  NEUTROABS 3,235  --   --   --   --   HGB 15.5   < > 14.2 13.9 14.2  HCT 48   < > 43 42 44  PLT 144*   < > 122* 110* 159   < > = values in this interval not displayed.   Lab Results  Component Value Date   TSH 2.37 04/07/2018   Lab Results  Component Value Date   HGBA1C 5 01/26/2018   Lab Results  Component Value Date   CHOL 176 01/05/2018   HDL 45 01/05/2018   LDLCALC 112 01/05/2018   LDLDIRECT 132.5 09/08/2012   TRIG 93 01/05/2018   CHOLHDL 2.7 11/15/2016    Significant Diagnostic Results in last 30 days:  No results found.  Assessment/Plan Bug bites Resemble flea bites, but not itching. Will observe.    Allergic rhinitis Stable, continue  Claritin 10mg  qd.   Senile angioma On arms, legs, chest, back, no injuries.     Family/ staff Communication: plan of care reviewed with the patient and charge nurse.   Labs/tests ordered: none  Time spend 25 minutes.

## 2018-07-04 NOTE — Assessment & Plan Note (Signed)
Resemble flea bites, but not itching. Will observe.

## 2018-07-04 NOTE — Assessment & Plan Note (Signed)
On arms, legs, chest, back, no injuries.

## 2018-07-18 ENCOUNTER — Encounter: Payer: Self-pay | Admitting: Nurse Practitioner

## 2018-07-18 ENCOUNTER — Non-Acute Institutional Stay (SKILLED_NURSING_FACILITY): Payer: Medicare Other | Admitting: Nurse Practitioner

## 2018-07-18 DIAGNOSIS — K5909 Other constipation: Secondary | ICD-10-CM

## 2018-07-18 DIAGNOSIS — N401 Enlarged prostate with lower urinary tract symptoms: Secondary | ICD-10-CM

## 2018-07-18 DIAGNOSIS — I1310 Hypertensive heart and chronic kidney disease without heart failure, with stage 1 through stage 4 chronic kidney disease, or unspecified chronic kidney disease: Secondary | ICD-10-CM | POA: Diagnosis not present

## 2018-07-18 DIAGNOSIS — E039 Hypothyroidism, unspecified: Secondary | ICD-10-CM

## 2018-07-18 DIAGNOSIS — F339 Major depressive disorder, recurrent, unspecified: Secondary | ICD-10-CM

## 2018-07-18 DIAGNOSIS — N138 Other obstructive and reflux uropathy: Secondary | ICD-10-CM

## 2018-07-18 DIAGNOSIS — G43009 Migraine without aura, not intractable, without status migrainosus: Secondary | ICD-10-CM | POA: Diagnosis not present

## 2018-07-18 DIAGNOSIS — G250 Essential tremor: Secondary | ICD-10-CM

## 2018-07-18 DIAGNOSIS — F332 Major depressive disorder, recurrent severe without psychotic features: Secondary | ICD-10-CM

## 2018-07-18 DIAGNOSIS — M129 Arthropathy, unspecified: Secondary | ICD-10-CM

## 2018-07-18 NOTE — Assessment & Plan Note (Signed)
Stable, no urinary retention, continue Tamsulosin 0.4mg qd.  

## 2018-07-18 NOTE — Assessment & Plan Note (Signed)
Not disabling. Continue [facility] for safety and care assistance.

## 2018-07-18 NOTE — Assessment & Plan Note (Signed)
Stable, continue MiraLax qd, Linzess 146mcg qd.

## 2018-07-18 NOTE — Assessment & Plan Note (Signed)
Stable, continue Tylenol 1000mg tid.  

## 2018-07-18 NOTE — Progress Notes (Signed)
Location:   SNF [place] Room Number: 39/J Place of Service:  SNF (31) Provider: [facility] Mast NP  [name], MD  Patient Care Team: [name], MD as PCP - General (Internal Medicine) [name], MD as Consulting Physician (Urology) [name], MD as Consulting Physician (Neurology) [name], MD as Consulting Physician (Dermatology) [name], MD (Inactive) as Consulting Physician (Gastroenterology) [name], MD as Consulting Physician (Orthopedic Surgery) [name], MD as Consulting Physician (Cardiology) [name], MD as Consulting Physician (Ophthalmology) [name], MD as Referring Physician (Psychiatry) [name], MD as Consulting Physician (General Surgery) [name], MD as Consulting Physician (Orthopedic Surgery) [name], [name], NP as Nurse Practitioner (Family Medicine) Mast, Man X, NP as Nurse Practitioner (Internal Medicine)  Extended Emergency Contact Information Primary Emergency Contact: Mcwhirt,[name] Address: [address]          [place], [place] [address] [place] Phone: [phone] Relation: Spouse  Code Status:  DNR Goals of care: Advanced Directive information Advanced Directives 07/18/2018  Does Patient Have a Medical Advance Directive? Yes  Type of Paramedic of [place];Living will;Out of facility DNR (pink MOST or yellow form)  Does patient want to make changes to medical advance directive? No - Patient declined  Copy of [place] in Chart? Yes - validated most recent copy scanned in chart (See row information)  Pre-existing out of facility DNR order (yellow form or pink MOST form) Pink MOST form placed in chart (order not valid for inpatient use);Yellow form placed in chart (order not valid for inpatient use)     Chief Complaint  Patient presents with  . Medical Management of Chronic  Issues    Routine visit     HPI:  Pt is a 85 y.o. male seen today for medical management of chronic diseases.      The patient resides in SNF [facility] for safety and care assistance, weight is stable. Hx of depression, stable on Wellbutrin 300mg  qd, Sertraline 200mg  qd, Mirtazapine 60mg  qd, Clonazepam 0.5mg  qhs. Hx of Hypothyroidism, on Levothyroxine 12mcg qd, last TSH 2.37 04/07/18. BPH, no urinary retention, on Tamsulosin 0.4mg  qd. Migraine HA, stable on Topmax 25mg  qd. Constipation, stable on MiraLax qd, Linzess 150mcg qd. HTN, blood pressure is controlled on Amlodipine 10mg  qd. OA pain, stable on Tylenol 1000mg  tid.    Past Medical History:  Diagnosis Date  . Acute urinary retention 01/28/2013  . AKI (acute kidney injury) ([place]) 01/26/2013  . Altered mental status 09/09/2013  . Anemia    B12 deficiency  . ANXIETY 06/07/2007   Qualifier: Diagnosis of  By: Talbert Cage CMA ([place]), June    . ARTHRITIS 06/07/2007   Qualifier: Diagnosis of  By: Talbert Cage CMA ([place]), June    . Balance problems 04/05/2011  . Benign essential tremor   . Benign fibroma of prostate 10/11/2011  . Bilateral inguinal hernia 10/09/2012  . Bilateral leg edema 09/13/2012  . Bladder retention 02/05/2013  . Bradycardia, sinus 08/02/2012  . CAROTID ARTERY DISEASE 03/31/2007   Qualifier: Diagnosis of  By: Linna Darner MD, Gwyndolyn Saxon    . Carotid stenosis    s/p L CEA  . Cataract, nuclear 03/21/2014  . 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 Cervical spinal stenosis   . Cervical stenosis of spine   . Critical lower limb ischemia   . Degeneration macular 11/02/2011  . Depression  Dr Albertine Patricia  . Difficulty in walking(719.7) 04/05/2011  . ESOPHAGITIS 08/06/2002   Qualifier: Diagnosis of  By: Talbert Cage CMA ([place]), June    . Essential and other specified forms of tremor 04/18/2013  . Fall at home 01/06/2017   left hip pain  . Falls   . Family history of colon cancer 07/24/2012  . Fecal impaction ([place]) 10/11/2012  . Femoral hernia, bilateral s/p lap  repair 01/16/2013 01/16/2013  . Glaucoma, compensated 03/21/2014  . H/O cardiovascular stress test    a. 02/2003 -> no ischemia/infarct.  Marland Kitchen HAMMER TOE 04/23/2010   Qualifier: Diagnosis of  By: Linna Darner MD, Gwyndolyn Saxon    . Hematoma of leg   . Hip hematoma, right 09/09/2013  . History of shingles 2009  . HTN (hypertension) 08/22/2012  . Hx of echocardiogram    Echocardiogram 08/01/12: Mild LVH, EF 55-60%, normal wall motion.  . Hydrocele 10/11/2011  . Hyperlipidemia   . 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 Hypothyroidism   . Macular degeneration disease   . Macular edema   . Migraines    Dr Jannifer Franklin  . MORTON'S NEUROMA, LEFT 08/22/2007   Qualifier: Diagnosis of  By: Linna Darner MD, Gwyndolyn Saxon    . PAD (peripheral artery disease) ([place])   . Peripheral neuropathy   . Personal history of colonic polyps 01/05/2013   2014 2 mm adenomatous polyp   . PREMATURE ATRIAL CONTRACTIONS 03/14/2006   Qualifier: Diagnosis of  By: Linna Darner MD, Gwendolyn Lima 12/16/2011  . SPINAL STENOSIS 03/14/2006   Qualifier: Diagnosis of  By: Linna Darner MD, William   Cervical spine    . Testosterone deficiency    Dr Lawerance Bach, [facility]  . Unspecified vitamin D deficiency 08/06/2012  . Urge incontinence 10/09/2012  . Urinary tract infection, site not specified 02/13/2013  . Weakness 08/02/2012   Past Surgical History:  Procedure Laterality Date  . APPENDECTOMY  1941  . BREAST CYST EXCISION Left 01/16/2013   Procedure: MASS EXCISION AXILLA;  Surgeon: Adin Hector, MD;  Location: [place];  Service: General;  Laterality: Left;  . CAROTID ENDARTERECTOMY  2005    L  . CATARACT EXTRACTION Right   . COLONOSCOPY  2014   Dr. Deatra Ina  . EYE SURGERY Right 02/2012   retina  . INGUINAL HERNIA REPAIR Bilateral 01/16/2013   Procedure: LAPAROSCOPIC BILATERAL INGUINAL DIRECT AND INDIRECT, FEMORAL, AND OBTURATOR HERNIAS;  Surgeon: Adin Hector, MD;  Location: [place];  Service: General;  Laterality: Bilateral;  . INSERTION OF MESH N/A 01/16/2013    Procedure: INSERTION OF MESH;  Surgeon: Adin Hector, MD;  Location: [place];  Service: General;  Laterality: N/A;  . LUMBAR LAMINECTOMY    . TONSILLECTOMY AND ADENOIDECTOMY    . TOTAL HIP ARTHROPLASTY  2005   L    Allergies  Allergen Reactions  . Latex Rash  . Tape Rash    Paper tape is ok to use     Allergies as of 07/18/2018      Reactions   Latex Rash   Tape Rash   Paper tape is ok to use      Medication List       Accurate as of July 18, 2018 11:59 PM. If you have any questions, ask your nurse or doctor.        acetaminophen 500 MG tablet Commonly known as:  TYLENOL Take 1,000 mg by mouth 3 (three) times daily.   amLODipine 10 MG tablet Commonly known as:  NORVASC  Take 10 mg by mouth daily.   aspirin 81 MG tablet Take 81 mg by mouth every morning.   atorvastatin 20 MG tablet Commonly known as:  LIPITOR Take 20 mg by mouth daily. What changed:  Another medication with the same name was removed. Continue taking this medication, and follow the directions you see here. Changed by:  Man X Mast, NP   B-complex with vitamin C tablet Take 1 tablet by mouth daily.   bimatoprost 0.01 % Soln Commonly known as:  LUMIGAN Place 1 drop into both eyes at bedtime.   buPROPion 300 MG 24 hr tablet Commonly known as:  WELLBUTRIN XL Take 300 mg by mouth daily. per psychiatry recommendation   clonazePAM 0.5 MG tablet Commonly known as:  KLONOPIN Take 1 tablet (0.5 mg total) by mouth at bedtime.   erythromycin ophthalmic ointment Place 1 application into both eyes at bedtime.   Fluticasone Propionate 0.05 % Lotn Apply topically 2 (two) times daily as needed. Coccyx bid prn   fluticasone 0.05 % cream Commonly known as:  CUTIVATE Apply 1 application topically 2 (two) times daily as needed (irritation).   ICAPS PO Take 1 tablet by mouth daily.   ketoconazole 2 % cream Commonly known as:  NIZORAL Apply 1 application topically 2 (two) times daily as needed for  irritation.   Krill Oil 500 MG Caps Take 500 mg by mouth every morning.   levothyroxine 50 MCG tablet Commonly known as:  SYNTHROID Take 50 mcg by mouth daily before breakfast. Take one tablet daily except on Sunday's take 1/2 tablet.   linaclotide 145 MCG Caps capsule Commonly known as:  LINZESS Take 1 capsule (145 mcg total) by mouth daily before breakfast.   loratadine 10 MG tablet Commonly known as:  CLARITIN Take 10 mg by mouth daily.   Melatonin 3 MG Tabs Take 1 tablet by mouth at bedtime.   mirtazapine 30 MG tablet Commonly known as:  REMERON Take 60 mg by mouth at bedtime.   polyethylene glycol 17 g packet Commonly known as:  MIRALAX / GLYCOLAX Take 17 g by mouth daily. Hold for loose stool   sertraline 100 MG tablet Commonly known as:  ZOLOFT Take 200 mg by mouth every morning.   Systane 0.4-0.3 % Soln Generic drug:  Polyethyl Glycol-Propyl Glycol Apply 1 drop to eye 4 (four) times daily.   tamsulosin 0.4 MG Caps capsule Commonly known as:  FLOMAX TAKE 1 CAPSULE (0.4 MG TOTAL) BY MOUTH DAILY.   topiramate 25 MG tablet Commonly known as:  TOPAMAX TAKE 1 TABLET BY MOUTH EVERY MORNING AND 2 TABLETS BY MOUTH EVERY EVENING   Vitamin D3 25 MCG (1000 UT) Caps Take 1,000 Units by mouth daily.      ROS was provided with assistance of staff Review of Systems  Constitutional: Negative for activity change, appetite change, chills, diaphoresis, fatigue, fever and unexpected weight change.  HENT: Positive for hearing loss. Negative for congestion and voice change.   Eyes: Positive for visual disturbance.  Respiratory: Negative for cough and shortness of breath.   Gastrointestinal: Negative for abdominal distention, abdominal pain, constipation, diarrhea, nausea and vomiting.  Genitourinary: Negative for difficulty urinating, dysuria and urgency.       Incontinent of urine.   Musculoskeletal: Positive for arthralgias and gait problem.  Skin: Negative for color  change and pallor.  Neurological: Positive for tremors. Negative for dizziness, speech difficulty, weakness, light-headedness and headaches.       Memory lapses  Psychiatric/Behavioral: Negative  for agitation, behavioral problems, hallucinations and sleep disturbance. The patient is not nervous/anxious.     Immunization History  Administered Date(s) Administered  . DTaP 11/05/2005  . Hepatitis B 1932/06/11  . Influenza Split 11/12/2011, 11/29/2013  . Influenza Whole 10/30/2007, 11/12/2008, 11/26/2009  . Influenza, High Dose Seasonal PF 12/31/2015, 11/24/2016  . Influenza,inj,quad, With Preservative 10/16/2016  . Influenza-Unspecified 11/28/2017  . PPD Test 06/23/2011  . Pneumococcal Conjugate-13 01/14/2016  . Pneumococcal Polysaccharide-23 02/16/1999  . Tdap 01/13/2017  . Varicella 11/09/2005  . Zoster 08/30/2006   Pertinent  Health Maintenance Due  Topic Date Due  . INFLUENZA VACCINE  09/16/2018  . PNA vac Low Risk Adult  Completed   Fall Risk  10/28/2017 10/27/2016 06/09/2016 02/03/2016 10/28/2015  Falls in the past year? Yes Yes Yes Yes Yes  Number falls in past yr: 1 2 or more 2 or more 2 or more 2 or more  Comment - - - 2 falls in last 2 months -  Injury with Fall? Yes Yes No Yes No  Comment - L hip - scraps and bruises -  Risk Factor Category  - - - - -  Risk for fall due to : - - - - Impaired balance/gait  Follow up - - - - -   Functional Status Survey:    Vitals:   07/18/18 1201  BP: 100/65  Pulse: 67  Resp: 20  Temp: (!) 96.9 F (36.1 C)  SpO2: 92%  Weight: 149 lb 14.4 oz (68 kg)  Height: 5\' 11"  (1.803 m)   Body mass index is 20.91 kg/m. Physical Exam Constitutional:      General: He is not in acute distress.    Appearance: Normal appearance. He is normal weight. He is not ill-appearing, toxic-appearing or diaphoretic.  HENT:     Head: Normocephalic and atraumatic.     Nose: Nose normal.     Mouth/Throat:     Mouth: Mucous membranes are moist.  Eyes:      Conjunctiva/sclera: Conjunctivae normal.     Pupils: Pupils are equal, round, and reactive to light.     Comments: Blind left eye.   Neck:     Musculoskeletal: Normal range of motion and neck supple.  Cardiovascular:     Rate and Rhythm: Normal rate and regular rhythm.     Heart sounds: No murmur.  Pulmonary:     Effort: Pulmonary effort is normal.     Breath sounds: No wheezing, rhonchi or rales.  Chest:     Chest wall: No tenderness.  Abdominal:     General: There is no distension.     Palpations: Abdomen is soft.     Tenderness: There is no abdominal tenderness. There is no right CVA tenderness, guarding or rebound.  Musculoskeletal:     Right lower leg: No edema.     Comments: W/c for mobility.   Skin:    General: Skin is warm and dry.  Neurological:     General: No focal deficit present.     Mental Status: He is alert. Mental status is at baseline.     Cranial Nerves: No cranial nerve deficit.     Motor: No weakness.     Coordination: Coordination normal.     Gait: Gait abnormal.     Deep Tendon Reflexes: Reflexes normal.     Comments: Oriented to person and place.   Psychiatric:        Mood and Affect: Mood normal.  Behavior: Behavior normal.     Labs reviewed: Recent Labs    09/05/17  03/16/18 03/23/18 04/07/18  NA 142   < > 144 139 142  K 4.4   < > 4.5 4.2 4.3  CL  --   --  111  --   --   CO2  --   --  27  --   --   BUN  --    < > 38* 40* 34*  CREATININE 1.29   < > 1.4* 1.4* 1.2  CALCIUM 8.5  --  9.0  --   --    < > = values in this interval not displayed.   Recent Labs    09/05/17  03/16/18 03/23/18 04/07/18  AST 15   < > 15 19 20   ALT 14   < > 13 17 18   ALKPHOS 40   < > 36 40 42  BILITOT 39.0  0.4  --   --   --   --   PROT 5.5  --  5.5  --   --   ALBUMIN 3.4  --  3.3  --   --    < > = values in this interval not displayed.   Recent Labs    09/13/17  03/16/18 03/23/18 04/07/18  WBC 5.0   < > 3.7 6.7 5.5  NEUTROABS 3,235  --   --   --    --   HGB 15.5   < > 14.2 13.9 14.2  HCT 48   < > 43 42 44  PLT 144*   < > 122* 110* 159   < > = values in this interval not displayed.   Lab Results  Component Value Date   TSH 2.37 04/07/2018   Lab Results  Component Value Date   HGBA1C 5 01/26/2018   Lab Results  Component Value Date   CHOL 176 01/05/2018   HDL 45 01/05/2018   LDLCALC 112 01/05/2018   LDLDIRECT 132.5 09/08/2012   TRIG 93 01/05/2018   CHOLHDL 2.7 11/15/2016    Significant Diagnostic Results in last 30 days:  No results found.  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   Migraine headache Stable, continue Topmax 25mg  qd.   Chronic constipation Stable, continue MiraLax qd, Linzess 131mcg qd.   Hypothyroidism Stable, last TSH wnl 2.37 04/07/18, continue Levothyroxine 25mcg qd.   Benign essential tremor Not disabling. Continue SNF [facility] for safety and care assistance.   Arthropathy Stable, continue Tylenol 1000mg  tid.   Benign prostatic hyperplasia with urinary obstruction Stable, no urinary retention, continue Tamsulosin 0.4mg  qd.   Depression His mood is stable, continue Wellbutrin 300mg  qd, Sertraline 200mg  qd, Mirtazapine 60mg  qd, Clonazepam 0.5mg  qhs.   Major depression, recurrent, chronic (HCC) Chronic issue, continue Wellbutrin, Sertraline, Mirtazapine, Clonazepam.    Family/ staff Communication: plan of care reviewed with the patient and charge nurse.   Labs/tests ordered:  None   Time spend 25 minutes

## 2018-07-18 NOTE — Assessment & Plan Note (Signed)
Blood pressure is controlled, continue Amlodipine 10mg  qd, last creat 1.2 06/12/18 at his baseline.

## 2018-07-18 NOTE — Assessment & Plan Note (Signed)
His mood is stable, continue Wellbutrin 300mg  qd, Sertraline 200mg  qd, Mirtazapine 60mg  qd, Clonazepam 0.5mg  qhs.

## 2018-07-18 NOTE — Assessment & Plan Note (Signed)
Stable, continue Topmax 25mg  qd.

## 2018-07-18 NOTE — Assessment & Plan Note (Signed)
Stable, last TSH wnl 2.37 04/07/18, continue Levothyroxine 61mcg qd.

## 2018-07-18 NOTE — Assessment & Plan Note (Signed)
Chronic issue, continue Wellbutrin, Sertraline, Mirtazapine, Clonazepam.

## 2018-08-14 LAB — NOVEL CORONAVIRUS, NAA: SARS-CoV-2, NAA: NOT DETECTED

## 2018-09-06 ENCOUNTER — Non-Acute Institutional Stay (SKILLED_NURSING_FACILITY): Payer: Medicare Other | Admitting: Family

## 2018-09-06 ENCOUNTER — Encounter: Payer: Self-pay | Admitting: Family

## 2018-09-06 DIAGNOSIS — I1310 Hypertensive heart and chronic kidney disease without heart failure, with stage 1 through stage 4 chronic kidney disease, or unspecified chronic kidney disease: Secondary | ICD-10-CM

## 2018-09-06 DIAGNOSIS — F339 Major depressive disorder, recurrent, unspecified: Secondary | ICD-10-CM | POA: Diagnosis not present

## 2018-09-06 DIAGNOSIS — E039 Hypothyroidism, unspecified: Secondary | ICD-10-CM | POA: Diagnosis not present

## 2018-09-06 DIAGNOSIS — E785 Hyperlipidemia, unspecified: Secondary | ICD-10-CM

## 2018-09-06 DIAGNOSIS — K5909 Other constipation: Secondary | ICD-10-CM

## 2018-09-06 NOTE — Progress Notes (Signed)
Location:  [facility] Room Number: [place] of Service:  SNF [phone]) Provider: Cecilee Rosner FNP-C   [name], MD  Patient Care Team: [name], MD as PCP - General (Internal Medicine) [name], MD as Consulting Physician (Urology) [name], MD as Consulting Physician (Neurology) [name], MD as Consulting Physician (Dermatology) [name], MD (Inactive) as Consulting Physician (Gastroenterology) [name], MD as Consulting Physician (Orthopedic Surgery) [name], MD as Consulting Physician (Cardiology) [name], MD as Consulting Physician (Ophthalmology) [name], MD as Referring Physician (Psychiatry) [name], MD as Consulting Physician (General Surgery) [name], MD as Consulting Physician (Orthopedic Surgery) Yasmene Salomone, [name], NP as Nurse Practitioner (Family Medicine) Mast, Man X, NP as Nurse Practitioner (Internal Medicine)  Extended Emergency Contact Information Primary Emergency Contact: [name],[name] Address: [address]          [place], [place] [address] [place] Phone: [phone] Relation: Spouse  Code Status: Full Code  Goals of care: Advanced Directive information Advanced Directives 09/06/2018  Does Patient Have a Medical Advance Directive? Yes  Type of Paramedic of [place];Living will  Does patient want to make changes to medical advance directive? No - Patient declined  Copy of [place] in Chart? Yes - validated most recent copy scanned in chart (See row information)  Pre-existing out of facility DNR order (yellow form or pink MOST form) -     Chief Complaint  Patient presents with  . Medical Management of Chronic Issues    Routine Visit     HPI:  Pt is a 85 y.o. male seen today [place] for medical management of chronic diseases.He is seen in his room  sitting on his wheelchair after his morning care.He denies any acute issues.He has a medical history of Hypertension,CKD stage 3,hyperlipidemia,depression with anxiety,hypothyroidism,essential tremors,BPH,AMD among other conditions.He continues to ambulate using front wheel walker for short distance and uses wheelchair.He requires assistance with his ADL's.No recent fall or weight changes.He states appetite is good.Facility Nurse reports no new concerns.   Past Medical History:  Diagnosis Date  . Acute urinary retention 01/28/2013  . AKI (acute kidney injury) ([place]) 01/26/2013  . Altered mental status 09/09/2013  . Anemia    B12 deficiency  . ANXIETY 06/07/2007   Qualifier: Diagnosis of  By: Talbert Cage CMA ([place]), June    . ARTHRITIS 06/07/2007   Qualifier: Diagnosis of  By: Talbert Cage CMA ([place]), June    . Balance problems 04/05/2011  . Benign essential tremor   . Benign fibroma of prostate 10/11/2011  . Bilateral inguinal hernia 10/09/2012  . Bilateral leg edema 09/13/2012  . Bladder retention 02/05/2013  . Bradycardia, sinus 08/02/2012  . CAROTID ARTERY DISEASE 03/31/2007   Qualifier: Diagnosis of  By: Linna Darner MD, Gwyndolyn Saxon    . Carotid stenosis    s/p L CEA  . Cataract, nuclear 03/21/2014  . 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 Cervical spinal stenosis   . Cervical stenosis of spine   . Critical lower limb ischemia   . Degeneration macular 11/02/2011  . Depression    Dr Albertine Patricia  . Difficulty in walking(719.7) 04/05/2011  . ESOPHAGITIS 08/06/2002   Qualifier: Diagnosis of  By: Talbert Cage CMA ([place]), June    . Essential and other specified forms of tremor 04/18/2013  . Fall at home 01/06/2017   left hip  pain  . Falls   . Family history of colon cancer 07/24/2012  . Fecal impaction ([place]) 10/11/2012  . Femoral hernia, bilateral s/p lap repair 01/16/2013 01/16/2013  . Glaucoma, compensated 03/21/2014  . H/O cardiovascular stress test    a. 02/2003 -> no ischemia/infarct.  Marland Kitchen HAMMER TOE 04/23/2010    Qualifier: Diagnosis of  By: Linna Darner MD, Gwyndolyn Saxon    . Hematoma of leg   . Hip hematoma, right 09/09/2013  . History of shingles 2009  . HTN (hypertension) 08/22/2012  . Hx of echocardiogram    Echocardiogram 08/01/12: Mild LVH, EF 55-60%, normal wall motion.  . Hydrocele 10/11/2011  . Hyperlipidemia   . 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 Hypothyroidism   . Macular degeneration disease   . Macular edema   . Migraines    Dr Jannifer Franklin  . MORTON'S NEUROMA, LEFT 08/22/2007   Qualifier: Diagnosis of  By: Linna Darner MD, Gwyndolyn Saxon    . PAD (peripheral artery disease) ([place])   . Peripheral neuropathy   . Personal history of colonic polyps 01/05/2013   2014 2 mm adenomatous polyp   . PREMATURE ATRIAL CONTRACTIONS 03/14/2006   Qualifier: Diagnosis of  By: Linna Darner MD, Gwendolyn Lima 12/16/2011  . SPINAL STENOSIS 03/14/2006   Qualifier: Diagnosis of  By: Linna Darner MD, William   Cervical spine    . Testosterone deficiency    Dr Lawerance Bach, [facility]  . Unspecified vitamin D deficiency 08/06/2012  . Urge incontinence 10/09/2012  . Urinary tract infection, site not specified 02/13/2013  . Weakness 08/02/2012   Past Surgical History:  Procedure Laterality Date  . APPENDECTOMY  1941  . BREAST CYST EXCISION Left 01/16/2013   Procedure: MASS EXCISION AXILLA;  Surgeon: Adin Hector, MD;  Location: [facility];  Service: General;  Laterality: Left;  . CAROTID ENDARTERECTOMY  2005    L  . CATARACT EXTRACTION Right   . COLONOSCOPY  2014   Dr. Deatra Ina  . EYE SURGERY Right 02/2012   retina  . INGUINAL HERNIA REPAIR Bilateral 01/16/2013   Procedure: LAPAROSCOPIC BILATERAL INGUINAL DIRECT AND INDIRECT, FEMORAL, AND OBTURATOR HERNIAS;  Surgeon: Adin Hector, MD;  Location: [facility];  Service: General;  Laterality: Bilateral;  . INSERTION OF MESH N/A 01/16/2013   Procedure: INSERTION OF MESH;  Surgeon: Adin Hector, MD;  Location: [facility];  Service: General;  Laterality: N/A;  . LUMBAR LAMINECTOMY    . TONSILLECTOMY AND  ADENOIDECTOMY    . TOTAL HIP ARTHROPLASTY  2005   L    Allergies  Allergen Reactions  . Latex Rash  . Tape Rash    Paper tape is ok to use     Allergies as of 09/06/2018      Reactions   Latex Rash   Tape Rash   Paper tape is ok to use      Medication List       Accurate as of September 06, 2018  4:17 PM. If you have any questions, ask your nurse or doctor.        STOP taking these medications   fluticasone 0.05 % cream Commonly known as: CUTIVATE Stopped by: [name] Jahvon Gosline, NP   Fluticasone Propionate 0.05 % Lotn Stopped by: Sandrea Hughs, NP     TAKE these medications   acetaminophen 500 MG tablet Commonly known as: TYLENOL Take 1,000 mg by mouth 3 (three) times daily.   amLODipine 10 MG tablet Commonly known as: NORVASC Take 10 mg by  mouth daily.   aspirin 81 MG tablet Take 81 mg by mouth every morning.   atorvastatin 20 MG tablet Commonly known as: LIPITOR Take 20 mg by mouth daily.   B-complex with vitamin C tablet Take 1 tablet by mouth daily.   bimatoprost 0.01 % Soln Commonly known as: LUMIGAN Place 1 drop into both eyes at bedtime.   buPROPion 300 MG 24 hr tablet Commonly known as: WELLBUTRIN XL Take 300 mg by mouth daily. per psychiatry recommendation   clonazePAM 0.5 MG tablet Commonly known as: KLONOPIN Take 1 tablet (0.5 mg total) by mouth at bedtime.   erythromycin ophthalmic ointment Place 1 application into both eyes at bedtime.   ICAPS PO Take 1 tablet by mouth daily.   ketoconazole 2 % cream Commonly known as: NIZORAL Apply 1 application topically 2 (two) times daily as needed for irritation.   Krill Oil 500 MG Caps Take 500 mg by mouth every morning.   levothyroxine 50 MCG tablet Commonly known as: SYNTHROID Take 50 mcg by mouth daily before breakfast. Take one tablet daily except on Sunday's take 1/2 tablet.   linaclotide 145 MCG Caps capsule Commonly known as: LINZESS Take 1 capsule (145 mcg total) by mouth daily  before breakfast.   loratadine 10 MG tablet Commonly known as: CLARITIN Take 10 mg by mouth daily.   Melatonin 3 MG Tabs Take 1 tablet by mouth at bedtime.   mirtazapine 30 MG tablet Commonly known as: REMERON Take 60 mg by mouth at bedtime.   polyethylene glycol 17 g packet Commonly known as: MIRALAX / GLYCOLAX Take 17 g by mouth daily. Hold for loose stool   sertraline 100 MG tablet Commonly known as: ZOLOFT Take 200 mg by mouth every morning.   Systane 0.4-0.3 % Soln Generic drug: Polyethyl Glycol-Propyl Glycol Apply 1 drop to eye 4 (four) times daily.   tamsulosin 0.4 MG Caps capsule Commonly known as: FLOMAX TAKE 1 CAPSULE (0.4 MG TOTAL) BY MOUTH DAILY.   topiramate 25 MG tablet Commonly known as: TOPAMAX TAKE 1 TABLET BY MOUTH EVERY MORNING AND 2 TABLETS BY MOUTH EVERY EVENING   Vitamin D3 25 MCG (1000 UT) Caps Take 1,000 Units by mouth daily.       Review of Systems  Constitutional: Negative for appetite change, chills, fatigue, fever and unexpected weight change.  HENT: Negative for congestion, rhinorrhea, sinus pressure, sinus pain, sneezing and sore throat.   Eyes: Positive for visual disturbance. Negative for pain, discharge, redness and itching.       Wears eye glasses   Respiratory: Negative for cough, chest tightness, shortness of breath and wheezing.   Cardiovascular: Negative for chest pain, palpitations and leg swelling.  Gastrointestinal: Negative for abdominal distention, abdominal pain, constipation, diarrhea, nausea and vomiting.  Endocrine: Negative for cold intolerance, heat intolerance, polydipsia, polyphagia and polyuria.  Genitourinary: Negative for difficulty urinating, dysuria, flank pain and hematuria.       Hx BPH   Musculoskeletal: Positive for arthralgias and gait problem.  Skin: Negative for color change, pallor and rash.  Neurological: Positive for tremors. Negative for dizziness, weakness, light-headedness, numbness and headaches.   Hematological: Does not bruise/bleed easily.  Psychiatric/Behavioral: Negative for agitation, behavioral problems and sleep disturbance. The patient is not nervous/anxious.     Immunization History  Administered Date(s) Administered  . DTaP 11/05/2005  . Hepatitis B 1932/08/12  . Influenza Split 11/12/2011, 11/29/2013  . Influenza Whole 10/30/2007, 11/12/2008, 11/26/2009  . Influenza, High Dose Seasonal PF 12/31/2015, 11/24/2016  .  Influenza,inj,quad, With Preservative 10/16/2016  . Influenza-Unspecified 11/28/2017  . PPD Test 06/23/2011  . Pneumococcal Conjugate-13 01/14/2016  . Pneumococcal Polysaccharide-23 02/16/1999  . Tdap 01/13/2017  . Varicella 11/09/2005  . Zoster 08/30/2006   Pertinent  Health Maintenance Due  Topic Date Due  . INFLUENZA VACCINE  09/16/2018  . PNA vac Low Risk Adult  Completed   Fall Risk  10/28/2017 10/27/2016 06/09/2016 02/03/2016 10/28/2015  Falls in the past year? Yes Yes Yes Yes Yes  Number falls in past yr: 1 2 or more 2 or more 2 or more 2 or more  Comment - - - 2 falls in last 2 months -  Injury with Fall? Yes Yes No Yes No  Comment - L hip - scraps and bruises -  Risk Factor Category  - - - - -  Risk for fall due to : - - - - Impaired balance/gait  Follow up - - - - -   Functional Status Survey:    Vitals:   09/06/18 1137  BP: (!) 144/73  Pulse: 83  Resp: 20  Temp: (!) 97.5 F (36.4 C)  TempSrc: Oral  SpO2: 96%  Weight: 149 lb 14.4 oz (68 kg)  Height: 6' (1.829 m)   Body mass index is 20.33 kg/m. Physical Exam Vitals signs and nursing note reviewed.  Constitutional:      General: He is not in acute distress.    Appearance: He is normal weight. He is not ill-appearing.  HENT:     Head: Normocephalic.     Right Ear: Tympanic membrane, ear canal and external ear normal. There is no impacted cerumen.     Left Ear: Tympanic membrane, ear canal and external ear normal. There is no impacted cerumen.     Nose: Nose normal. No  congestion or rhinorrhea.     Mouth/Throat:     Mouth: Mucous membranes are moist.     Pharynx: Oropharynx is clear. No oropharyngeal exudate or posterior oropharyngeal erythema.  Eyes:     General: No scleral icterus.       Right eye: No discharge.        Left eye: No discharge.     Conjunctiva/sclera: Conjunctivae normal.     Comments: Corrective lens in place.Left eye blindness   Neck:     Musculoskeletal: Normal range of motion. No neck rigidity or muscular tenderness.     Vascular: No carotid bruit.  Cardiovascular:     Rate and Rhythm: Normal rate and regular rhythm.     Pulses: Normal pulses.     Heart sounds: Normal heart sounds. No murmur. No friction rub. No gallop.   Pulmonary:     Effort: Pulmonary effort is normal. No respiratory distress.     Breath sounds: Normal breath sounds. No wheezing, rhonchi or rales.  Chest:     Chest wall: No tenderness.  Abdominal:     General: Bowel sounds are normal. There is no distension.     Palpations: Abdomen is soft. There is no mass.     Tenderness: There is no abdominal tenderness. There is no right CVA tenderness, left CVA tenderness, guarding or rebound.  Musculoskeletal:        General: No swelling or tenderness.     Right lower leg: No edema.     Left lower leg: No edema.     Comments: Unsteady gait ambulates with assistive device and self propels on wheelchair.   Lymphadenopathy:     Cervical: No cervical adenopathy.  Skin:    General: Skin is warm and dry.     Coloration: Skin is not pale.     Findings: No bruising or rash.     Comments: sacral area non-blanchable skin redness.staff applying zinc oxide.   Neurological:     Mental Status: He is alert.     Cranial Nerves: No cranial nerve deficit.     Sensory: No sensory deficit.     Motor: No weakness.     Gait: Gait abnormal.     Comments: Alert and oriented to person and place   Psychiatric:        Mood and Affect: Mood normal.        Behavior: Behavior normal.         Thought Content: Thought content normal.        Judgment: Judgment normal.     Labs reviewed: Recent Labs    03/16/18 03/23/18 04/07/18  NA 144 139 142  K 4.5 4.2 4.3  CL 111  --   --   CO2 27  --   --   BUN 38* 40* 34*  CREATININE 1.4* 1.4* 1.2  CALCIUM 9.0  --   --    Recent Labs    03/16/18 03/23/18 04/07/18  AST 15 19 20   ALT 13 17 18   ALKPHOS 36 40 42  PROT 5.5  --   --   ALBUMIN 3.3  --   --    Recent Labs    09/13/17  03/16/18 03/23/18 04/07/18  WBC 5.0   < > 3.7 6.7 5.5  NEUTROABS 3,235  --   --   --   --   HGB 15.5   < > 14.2 13.9 14.2  HCT 48   < > 43 42 44  PLT 144*   < > 122* 110* 159   < > = values in this interval not displayed.   Lab Results  Component Value Date   TSH 2.37 04/07/2018   Lab Results  Component Value Date   HGBA1C 5 01/26/2018   Lab Results  Component Value Date   CHOL 176 01/05/2018   HDL 45 01/05/2018   LDLCALC 112 01/05/2018   LDLDIRECT 132.5 09/08/2012   TRIG 93 01/05/2018   CHOLHDL 2.7 11/15/2016    Significant Diagnostic Results in last 30 days:  No results found.  Assessment/Plan 1. Acquired hypothyroidism Lab Results  Component Value Date   TSH 2.37 04/07/2018  Continue on levothyroxine 50 mcg tablet daily. TSH level 09/11/2018    2. 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 - CBC/diff,CMP 09/11/2018   3. Chronic constipation Current regimen effective continue to encourage hydration.   4. Major depression, recurrent, chronic (HCC) Mood stable.continue current medication follow up with Psychiatry service as directed.monitor for mood changes.   5. Hyperlipidemia LDL goal <70 LDL 112 (01/05/2018) not at goal for CVA.Continue on atorvastatin 20 mg tablet daily. Check  fasting Lipid panel 09/11/2018.  Family/ staff Communication: Reviewed plan of care with patient and facility Nurse.   Labs/tests ordered: - CBC/diff,CMP,TSH level,Lipid panel 09/11/2018  Sandrea Hughs, NP

## 2018-09-11 LAB — BASIC METABOLIC PANEL
BUN: 34 — AB (ref 4–21)
Creatinine: 1.3 (ref 0.6–1.3)
Glucose: 89
Potassium: 4.1 (ref 3.4–5.3)
Sodium: 142 (ref 137–147)

## 2018-09-11 LAB — LIPID PANEL
Cholesterol: 151 (ref 0–200)
HDL: 39 (ref 35–70)
LDL Cholesterol: 93
LDl/HDL Ratio: 3.9
Triglycerides: 99 (ref 40–160)

## 2018-09-11 LAB — HEPATIC FUNCTION PANEL
ALT: 12 (ref 10–40)
AST: 18 (ref 14–40)
Alkaline Phosphatase: 44 (ref 25–125)
Bilirubin, Total: 0.4

## 2018-09-11 LAB — CBC AND DIFFERENTIAL
HCT: 42 (ref 41–53)
Hemoglobin: 14 (ref 13.5–17.5)
Platelets: 145 — AB (ref 150–399)
WBC: 4.7

## 2018-09-11 LAB — TSH: TSH: 1.77 (ref 0.41–5.90)

## 2018-09-27 ENCOUNTER — Other Ambulatory Visit: Payer: Self-pay | Admitting: *Deleted

## 2018-09-27 ENCOUNTER — Non-Acute Institutional Stay (SKILLED_NURSING_FACILITY): Payer: Medicare Other | Admitting: Internal Medicine

## 2018-09-27 ENCOUNTER — Encounter: Payer: Self-pay | Admitting: Internal Medicine

## 2018-09-27 DIAGNOSIS — E785 Hyperlipidemia, unspecified: Secondary | ICD-10-CM | POA: Diagnosis not present

## 2018-09-27 DIAGNOSIS — I10 Essential (primary) hypertension: Secondary | ICD-10-CM | POA: Diagnosis not present

## 2018-09-27 DIAGNOSIS — E039 Hypothyroidism, unspecified: Secondary | ICD-10-CM

## 2018-09-27 DIAGNOSIS — F332 Major depressive disorder, recurrent severe without psychotic features: Secondary | ICD-10-CM | POA: Diagnosis not present

## 2018-09-27 LAB — CHLORIDE
Albumin: 3.5
Calcium: 8.4
Carbon Dioxide, Total: 23
Chloride: 112
Globulin: 2.3
Total Protein: 5.8 g/dL

## 2018-09-27 NOTE — Progress Notes (Signed)
Location:  [facility] Room Number: 37 Place of Service:  SNF [phone]) Provider:  Veleta Miners  MD  [name], MD  Patient Care Team: [name], MD as PCP - General (Internal Medicine) [name], MD as Consulting Physician (Urology) [name], MD as Consulting Physician (Neurology) [name], MD as Consulting Physician (Dermatology) [name], MD (Inactive) as Consulting Physician (Gastroenterology) [name], MD as Consulting Physician (Orthopedic Surgery) [name], MD as Consulting Physician (Cardiology) [name], MD as Consulting Physician (Ophthalmology) [name], MD as Referring Physician (Psychiatry) [name], MD as Consulting Physician (General Surgery) [name], MD as Consulting Physician (Orthopedic Surgery) [name], [name], NP as Nurse Practitioner (Family Medicine) Mast, Man X, NP as Nurse Practitioner (Internal Medicine)  Extended Emergency Contact Information Primary Emergency Contact: Fay,[name] Address: [address] [place]          [place], [place] [address] [place] Phone: [phone] Relation: Spouse  Code Status: Full Code Goals of care: Advanced Directive information Advanced Directives 09/27/2018  Does Patient Have a Medical Advance Directive? Yes  Type of Paramedic of [place];Living will  Does patient want to make changes to medical advance directive? No - Patient declined  Copy of Marie in Chart? Yes - validated most recent copy scanned in chart (See row information)  Pre-existing out of facility DNR order (yellow form or pink MOST form) -     Chief Complaint  Patient presents with  . Medical Management of Chronic Issues    HPI:  Pt is a 85 y.o. male seen today for medical management of chronic diseases.    Patient is Long term Resident of SNF. Marland KitchenHis wife lives in [place].  He has h/o HTN, Hyperlipidemia, PAD, BPH , CKD stage 2, Hypothyroidism, and Major Depression Also Has h/o Cervial Stenosis with Compressive Myelopathy, BPH He does not have any acute issues today. He is mostly Wheelchair dependent. Can do transfers Does have Poor Appetite . Has lost 3-4 lbs in Past 3 months Is on Prostat now Has stage 2 pressure wound but stable   Past Medical History:  Diagnosis Date  . Acute urinary retention 01/28/2013  . AKI (acute kidney injury) ([facility]) 01/26/2013  . Altered mental status 09/09/2013  . Anemia    B12 deficiency  . ANXIETY 06/07/2007   Qualifier: Diagnosis of  By: Talbert Cage CMA ([place]), June    . ARTHRITIS 06/07/2007   Qualifier: Diagnosis of  By: Talbert Cage CMA ([place]), June    . Balance problems 04/05/2011  . Benign essential tremor   . Benign fibroma of prostate 10/11/2011  . Bilateral inguinal hernia 10/09/2012  . Bilateral leg edema 09/13/2012  . Bladder retention 02/05/2013  . Bradycardia, sinus 08/02/2012  . CAROTID ARTERY DISEASE 03/31/2007   Qualifier: Diagnosis of  By: Linna Darner MD, Gwyndolyn Saxon    . Carotid stenosis    s/p L CEA  . Cataract, nuclear 03/21/2014  . 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 Cervical spinal stenosis   . Cervical stenosis of spine   . Critical lower limb ischemia   . Degeneration macular 11/02/2011  . Depression    Dr Albertine Patricia  . Difficulty in walking(719.7) 04/05/2011  . ESOPHAGITIS 08/06/2002   Qualifier: Diagnosis of  By: Talbert Cage CMA ([place]), June    . Essential and other specified forms of tremor 04/18/2013  . Fall  at home 01/06/2017   left hip pain  . Falls   . Family history of colon cancer 07/24/2012  . Fecal impaction ([facility]) 10/11/2012  . Femoral hernia, bilateral s/p lap repair 01/16/2013 01/16/2013  . Glaucoma, compensated 03/21/2014  . H/O cardiovascular stress test    a. 02/2003 -> no ischemia/infarct.  Marland Kitchen HAMMER TOE 04/23/2010   Qualifier: Diagnosis of  By: Linna Darner MD, Gwyndolyn Saxon    . Hematoma of leg   . Hip hematoma, right  09/09/2013  . History of shingles 2009  . HTN (hypertension) 08/22/2012  . Hx of echocardiogram    Echocardiogram 08/01/12: Mild LVH, EF 55-60%, normal wall motion.  . Hydrocele 10/11/2011  . Hyperlipidemia   . 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 Hypothyroidism   . Macular degeneration disease   . Macular edema   . Migraines    Dr Jannifer Franklin  . MORTON'S NEUROMA, LEFT 08/22/2007   Qualifier: Diagnosis of  By: Linna Darner MD, Gwyndolyn Saxon    . PAD (peripheral artery disease) ([facility])   . Peripheral neuropathy   . Personal history of colonic polyps 01/05/2013   2014 2 mm adenomatous polyp   . PREMATURE ATRIAL CONTRACTIONS 03/14/2006   Qualifier: Diagnosis of  By: Linna Darner MD, Gwendolyn Lima 12/16/2011  . SPINAL STENOSIS 03/14/2006   Qualifier: Diagnosis of  By: Linna Darner MD, William   Cervical spine    . Testosterone deficiency    Dr Lawerance Bach, [facility]  . Unspecified vitamin D deficiency 08/06/2012  . Urge incontinence 10/09/2012  . Urinary tract infection, site not specified 02/13/2013  . Weakness 08/02/2012   Past Surgical History:  Procedure Laterality Date  . APPENDECTOMY  1941  . BREAST CYST EXCISION Left 01/16/2013   Procedure: MASS EXCISION AXILLA;  Surgeon: Adin Hector, MD;  Location: [facility];  Service: General;  Laterality: Left;  . CAROTID ENDARTERECTOMY  2005    L  . CATARACT EXTRACTION Right   . COLONOSCOPY  2014   Dr. Deatra Ina  . EYE SURGERY Right 02/2012   retina  . INGUINAL HERNIA REPAIR Bilateral 01/16/2013   Procedure: LAPAROSCOPIC BILATERAL INGUINAL DIRECT AND INDIRECT, FEMORAL, AND OBTURATOR HERNIAS;  Surgeon: Adin Hector, MD;  Location: [facility];  Service: General;  Laterality: Bilateral;  . INSERTION OF MESH N/A 01/16/2013   Procedure: INSERTION OF MESH;  Surgeon: Adin Hector, MD;  Location: [facility];  Service: General;  Laterality: N/A;  . LUMBAR LAMINECTOMY    . TONSILLECTOMY AND ADENOIDECTOMY    . TOTAL HIP ARTHROPLASTY  2005   L    Allergies  Allergen Reactions  .  Latex Rash  . Tape Rash    Paper tape is ok to use     Outpatient Encounter Medications as of 09/27/2018  Medication Sig  . acetaminophen (TYLENOL) 500 MG tablet Take 1,000 mg by mouth 3 (three) times daily.   Marland Kitchen amLODipine (NORVASC) 10 MG tablet Take 10 mg by mouth daily.  Marland Kitchen aspirin 81 MG tablet Take 81 mg by mouth every morning.   Marland Kitchen atorvastatin (LIPITOR) 20 MG tablet Take 20 mg by mouth daily.  . B Complex-C (B-COMPLEX WITH VITAMIN C) tablet Take 1 tablet by mouth daily.  . bimatoprost (LUMIGAN) 0.01 % SOLN Place 1 drop into both eyes at bedtime.  Marland Kitchen buPROPion (WELLBUTRIN XL) 300 MG 24 hr tablet Take 300 mg by mouth daily. per psychiatry recommendation  . Cholecalciferol (VITAMIN D3) 1000 units CAPS Take 1,000 Units by mouth daily.   Marland Kitchen  clonazePAM (KLONOPIN) 0.5 MG tablet Take 1 tablet (0.5 mg total) by mouth at bedtime.  Marland Kitchen erythromycin ophthalmic ointment Place 1 application into both eyes at bedtime.   Marland Kitchen ketoconazole (NIZORAL) 2 % cream Apply 1 application topically 2 (two) times daily as needed for irritation.   Marland Kitchen levothyroxine (SYNTHROID, LEVOTHROID) 50 MCG tablet Take 50 mcg by mouth daily before breakfast. Take one tablet daily except on Sunday's take 1/2 tablet.  . linaclotide (LINZESS) 145 MCG CAPS capsule Take 1 capsule (145 mcg total) by mouth daily before breakfast.  . loratadine (CLARITIN) 10 MG tablet Take 10 mg by mouth daily.  . Melatonin 3 MG TABS Take 1 tablet by mouth at bedtime.  . mirtazapine (REMERON) 30 MG tablet Take 60 mg by mouth at bedtime.  . Multiple Vitamins-Minerals (ICAPS PO) Take 1 tablet by mouth daily.  Vladimir Faster Glycol-Propyl Glycol (SYSTANE) 0.4-0.3 % SOLN Apply 1 drop to eye 4 (four) times daily.  . polyethylene glycol (MIRALAX / GLYCOLAX) packet Take 17 g by mouth daily. Hold for loose stool  . sertraline (ZOLOFT) 100 MG tablet Take 200 mg by mouth every morning.   . tamsulosin (FLOMAX) 0.4 MG CAPS capsule TAKE 1 CAPSULE (0.4 MG TOTAL) BY MOUTH  DAILY.  Marland Kitchen topiramate (TOPAMAX) 25 MG tablet TAKE 1 TABLET BY MOUTH EVERY MORNING AND 2 TABLETS BY MOUTH EVERY EVENING  . [DISCONTINUED] Krill Oil 500 MG CAPS Take 500 mg by mouth every morning.    No facility-administered encounter medications on file as of 09/27/2018.     Review of Systems Review of Systems  Constitutional: Negative for activity change, appetite change, chills, diaphoresis, fatigue and fever.  HENT: Negative for mouth sores, postnasal drip, rhinorrhea, sinus pain and sore throat.   Respiratory: Negative for apnea, cough, chest tightness, shortness of breath and wheezing.   Cardiovascular: Negative for chest pain, palpitations and leg swelling.  Gastrointestinal: Negative for abdominal distention, abdominal pain, constipation, diarrhea, nausea and vomiting.  Genitourinary: Negative for dysuria and frequency.  Musculoskeletal: Negative for arthralgias, joint swelling and myalgias.  Skin: Negative for rash.  Neurological: Negative for dizziness, syncope, weakness, light-headedness and numbness.  Psychiatric/Behavioral: Negative for behavioral problems, confusion and sleep disturbance.    Immunization History  Administered Date(s) Administered  . DTaP 11/05/2005  . Hepatitis B 09/05/1932  . Influenza Split 11/12/2011, 11/29/2013  . Influenza Whole 10/30/2007, 11/12/2008, 11/26/2009  . Influenza, High Dose Seasonal PF 12/31/2015, 11/24/2016  . Influenza,inj,quad, With Preservative 10/16/2016  . Influenza-Unspecified 11/28/2017  . PPD Test 06/23/2011  . Pneumococcal Conjugate-13 01/14/2016  . Pneumococcal Polysaccharide-23 02/16/1999  . Tdap 01/13/2017  . Varicella 11/09/2005  . Zoster 08/30/2006   Pertinent  Health Maintenance Due  Topic Date Due  . INFLUENZA VACCINE  09/16/2018  . PNA vac Low Risk Adult  Completed   Fall Risk  10/28/2017 10/27/2016 06/09/2016 02/03/2016 10/28/2015  Falls in the past year? Yes Yes Yes Yes Yes  Number falls in past yr: 1 2 or more 2  or more 2 or more 2 or more  Comment - - - 2 falls in last 2 months -  Injury with Fall? Yes Yes No Yes No  Comment - L hip - scraps and bruises -  Risk Factor Category  - - - - -  Risk for fall due to : - - - - Impaired balance/gait  Follow up - - - - -   Functional Status Survey:    Vitals:   09/27/18 1004  BP:  116/78  Pulse: 73  Resp: 19  Temp: 97.9 F (36.6 C)  SpO2: 97%  Weight: 143 lb 4.8 oz (65 kg)  Height: 6' (1.829 m)   Body mass index is 19.43 kg/m. Physical Exam  Constitutional: Oriented to person, place, and time. Well-developed very thin bult  HENT:  Head: Normocephalic.  Mouth/Throat: Oropharynx is clear and moist.  Eyes: Pupils are equal, round, and reactive to light.  Neck: Neck supple.  Cardiovascular: Normal rate and normal heart sounds.  No murmur heard. Pulmonary/Chest: Effort normal and breath sounds normal. No respiratory distress. No wheezes. She has no rales.  Abdominal: Soft. Bowel sounds are normal. No distension. There is no tenderness. There is no rebound.  Musculoskeletal: No edema.  Lymphadenopathy: none Neurological: Alert and oriented to person, place, and time.  Skin: Skin is warm and dry.  Psychiatric: Normal mood and affect. Behavior is normal. Thought content normal.    Labs reviewed: Recent Labs    03/16/18 03/23/18 04/07/18 09/11/18  NA 144 139 142 142  K 4.5 4.2 4.3 4.1  CL 111  --   --  112  CO2 27  --   --  23  BUN 38* 40* 34* 34*  CREATININE 1.4* 1.4* 1.2 1.3  CALCIUM 9.0  --   --  8.4   Recent Labs    03/16/18 03/23/18 04/07/18 09/11/18  AST 15 19 20 18   ALT 13 17 18 12   ALKPHOS 36 40 42 44  PROT 5.5  --   --  5.8  ALBUMIN 3.3  --   --  3.5   Recent Labs    03/23/18 04/07/18 09/11/18  WBC 6.7 5.5 4.7  HGB 13.9 14.2 14.0  HCT 42 44 42  PLT 110* 159 145*   Lab Results  Component Value Date   TSH 1.77 09/11/2018   Lab Results  Component Value Date   HGBA1C 5 01/26/2018   Lab Results  Component Value  Date   CHOL 151 09/11/2018   HDL 39 09/11/2018   LDLCALC 93 09/11/2018   LDLDIRECT 132.5 09/08/2012   TRIG 99 09/11/2018   CHOLHDL 2.7 11/15/2016    Significant Diagnostic Results in last 30 days:  No results found.  Assessment/Plan Essential Hypertension BP stable on Norvasc Hypothyroidism TSH normal in 7/20 Chronic constipation On Linzess Major Depression On Zoloft and Remeron, Wellbutrin and Klonopin NO Changes  Follows with Psychiatrist  Chronic  Conjuctivits On Erythromycin ointment per Ophthalmologist Hyperlipidemia Repeat LDL was 93 Continue Statin Would not increase the dose due to his mild weight loss BPH Symptoms Stable on Flomax Essential Tremors On Topamax   Family/ staff Communication:   Labs/tests ordered:    Total time spent in this patient care encounter was  25_  minutes; greater than 50% of the visit spent counseling patient and staff, reviewing records , Labs and coordinating care for problems addressed at this encounter.

## 2018-10-25 ENCOUNTER — Encounter: Payer: Self-pay | Admitting: Nurse Practitioner

## 2018-10-25 ENCOUNTER — Non-Acute Institutional Stay (SKILLED_NURSING_FACILITY): Payer: Medicare Other | Admitting: Nurse Practitioner

## 2018-10-25 DIAGNOSIS — N400 Enlarged prostate without lower urinary tract symptoms: Secondary | ICD-10-CM

## 2018-10-25 DIAGNOSIS — E039 Hypothyroidism, unspecified: Secondary | ICD-10-CM

## 2018-10-25 DIAGNOSIS — K5909 Other constipation: Secondary | ICD-10-CM | POA: Diagnosis not present

## 2018-10-25 DIAGNOSIS — I10 Essential (primary) hypertension: Secondary | ICD-10-CM | POA: Diagnosis not present

## 2018-10-25 DIAGNOSIS — M25552 Pain in left hip: Secondary | ICD-10-CM

## 2018-10-25 DIAGNOSIS — F339 Major depressive disorder, recurrent, unspecified: Secondary | ICD-10-CM

## 2018-10-25 DIAGNOSIS — G43009 Migraine without aura, not intractable, without status migrainosus: Secondary | ICD-10-CM | POA: Diagnosis not present

## 2018-10-25 NOTE — Assessment & Plan Note (Signed)
Stable, continue Topamax 25mg  qd.

## 2018-10-25 NOTE — Assessment & Plan Note (Signed)
stable, continue MiraLax qd, Linzess 117mcg qd.

## 2018-10-25 NOTE — Assessment & Plan Note (Signed)
Chronic, continue Tylenol 1000mg  tid, w/c for mobility.

## 2018-10-25 NOTE — Assessment & Plan Note (Signed)
Blood pressure is controlled, continue Amlodipine 10mg qd.  

## 2018-10-25 NOTE — Assessment & Plan Note (Signed)
His mood is stable, continue Sertraline 200mg  qd, Clonazepam 0.5mg  qhs, Wellbutrin 300mg  qd, Mirtazapine 60g qd.

## 2018-10-25 NOTE — Assessment & Plan Note (Signed)
Stable, continue Levothyroxine 19mcg qd, last TSH 1.77 09/11/18

## 2018-10-25 NOTE — Progress Notes (Signed)
Location:  [facility] Room Number: 37 Place of Service:  SNF [phone]) Provider:  Mc Bloodworth< ManXie  NP  [name], MD  Patient Care Team: [name], MD as PCP - General (Internal Medicine) [name], MD as Consulting Physician (Urology) [name], MD as Consulting Physician (Neurology) [name], MD as Consulting Physician (Dermatology) [name], MD (Inactive) as Consulting Physician (Gastroenterology) [name], MD as Consulting Physician (Orthopedic Surgery) [name], MD as Consulting Physician (Cardiology) [name], MD as Consulting Physician (Ophthalmology) [name], MD as Referring Physician (Psychiatry) [name], MD as Consulting Physician (General Surgery) [name], MD as Consulting Physician (Orthopedic Surgery) [name], [name], NP as Nurse Practitioner (Family Medicine) Yigit Norkus X, NP as Nurse Practitioner (Internal Medicine)  Extended Emergency Contact Information Primary Emergency Contact: Ringenberg,[name] Address: [address]          [place], [place] [address] [place] Phone: [phone] Relation: Spouse  Code Status:  Full Code Goals of care: Advanced Directive information Advanced Directives 10/25/2018  Does Patient Have a Medical Advance Directive? Yes  Type of Paramedic of [place];Living will  Does patient want to make changes to medical advance directive? No - Patient declined  Copy of [place] in Chart? Yes - validated most recent copy scanned in chart (See row information)  Pre-existing out of facility DNR order (yellow form or pink MOST form) -     Chief Complaint  Patient presents with  . Medical Management of Chronic Issues    HPI:  Pt is a 85 y.o. male seen today for medical management of chronic diseases.    Hx of migraine, on Topamax 25mg  qd. BPH, stable on Tamsulosin  0.4mg  qd. His mood is stable on Sertraline 200mg  qd, Clonazepam 0.5mg  qhs, Wellbutrin 300mg  qd, Mirtazapine 60g qd. Constipation, stable, on MiraLax qd, Linzess 190mcg qd. Hypothyroidism, on Levothyroxine 9mcg qd, last TSH 1.77 09/11/18. HTN, blood pressure is controlled, on Amlodipine 10mg  qd. OA pain, stable, on Tylenol 1000mg  tid.    Past Medical History:  Diagnosis Date  . Acute urinary retention 01/28/2013  . AKI (acute kidney injury) ([place]) 01/26/2013  . Altered mental status 09/09/2013  . Anemia    B12 deficiency  . ANXIETY 06/07/2007   Qualifier: Diagnosis of  By: Talbert Cage CMA ([place]), June    . ARTHRITIS 06/07/2007   Qualifier: Diagnosis of  By: Talbert Cage CMA ([place]), June    . Balance problems 04/05/2011  . Benign essential tremor   . Benign fibroma of prostate 10/11/2011  . Bilateral inguinal hernia 10/09/2012  . Bilateral leg edema 09/13/2012  . Bladder retention 02/05/2013  . Bradycardia, sinus 08/02/2012  . CAROTID ARTERY DISEASE 03/31/2007   Qualifier: Diagnosis of  By: Linna Darner MD, Gwyndolyn Saxon    . Carotid stenosis    s/p L CEA  . Cataract, nuclear 03/21/2014  . 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 Cervical spinal stenosis   . Cervical stenosis of spine   . Critical lower limb ischemia   . Degeneration macular 11/02/2011  . Depression    Dr Albertine Patricia  . Difficulty in walking(719.7) 04/05/2011  . ESOPHAGITIS 08/06/2002   Qualifier: Diagnosis of  By: Talbert Cage CMA ([place]), June    . Essential and other specified forms of tremor 04/18/2013  . Fall at home 01/06/2017   left hip pain  . Falls   .  Family history of colon cancer 07/24/2012  . Fecal impaction ([place]) 10/11/2012  . Femoral hernia, bilateral s/p lap repair 01/16/2013 01/16/2013  . Glaucoma, compensated 03/21/2014  . H/O cardiovascular stress test    a. 02/2003 -> no ischemia/infarct.  Marland Kitchen HAMMER TOE 04/23/2010   Qualifier: Diagnosis of  By: Linna Darner MD, Gwyndolyn Saxon    . Hematoma of leg   . Hip hematoma, right 09/09/2013  . History of shingles  2009  . HTN (hypertension) 08/22/2012  . Hx of echocardiogram    Echocardiogram 08/01/12: Mild LVH, EF 55-60%, normal wall motion.  . Hydrocele 10/11/2011  . Hyperlipidemia   . 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 Hypothyroidism   . Macular degeneration disease   . Macular edema   . Migraines    Dr Jannifer Franklin  . MORTON'S NEUROMA, LEFT 08/22/2007   Qualifier: Diagnosis of  By: Linna Darner MD, Gwyndolyn Saxon    . PAD (peripheral artery disease) ([place])   . Peripheral neuropathy   . Personal history of colonic polyps 01/05/2013   2014 2 mm adenomatous polyp   . PREMATURE ATRIAL CONTRACTIONS 03/14/2006   Qualifier: Diagnosis of  By: Linna Darner MD, Gwendolyn Lima 12/16/2011  . SPINAL STENOSIS 03/14/2006   Qualifier: Diagnosis of  By: Linna Darner MD, William   Cervical spine    . Testosterone deficiency    Dr Lawerance Bach, [facility]  . Unspecified vitamin D deficiency 08/06/2012  . Urge incontinence 10/09/2012  . Urinary tract infection, site not specified 02/13/2013  . Weakness 08/02/2012   Past Surgical History:  Procedure Laterality Date  . APPENDECTOMY  1941  . BREAST CYST EXCISION Left 01/16/2013   Procedure: MASS EXCISION AXILLA;  Surgeon: Adin Hector, MD;  Location: [facility];  Service: General;  Laterality: Left;  . CAROTID ENDARTERECTOMY  2005    L  . CATARACT EXTRACTION Right   . COLONOSCOPY  2014   Dr. Deatra Ina  . EYE SURGERY Right 02/2012   retina  . INGUINAL HERNIA REPAIR Bilateral 01/16/2013   Procedure: LAPAROSCOPIC BILATERAL INGUINAL DIRECT AND INDIRECT, FEMORAL, AND OBTURATOR HERNIAS;  Surgeon: Adin Hector, MD;  Location: [facility];  Service: General;  Laterality: Bilateral;  . INSERTION OF MESH N/A 01/16/2013   Procedure: INSERTION OF MESH;  Surgeon: Adin Hector, MD;  Location: [facility];  Service: General;  Laterality: N/A;  . LUMBAR LAMINECTOMY    . TONSILLECTOMY AND ADENOIDECTOMY    . TOTAL HIP ARTHROPLASTY  2005   L    Allergies  Allergen Reactions  . Latex Rash  . Tape Rash    Paper  tape is ok to use     Outpatient Encounter Medications as of 10/25/2018  Medication Sig  . acetaminophen (TYLENOL) 500 MG tablet Take 1,000 mg by mouth 3 (three) times daily.   Marland Kitchen amLODipine (NORVASC) 10 MG tablet Take 10 mg by mouth daily.  Marland Kitchen aspirin 81 MG tablet Take 81 mg by mouth every morning.   Marland Kitchen atorvastatin (LIPITOR) 20 MG tablet Take 20 mg by mouth daily.  . B Complex-C (B-COMPLEX WITH VITAMIN C) tablet Take 1 tablet by mouth daily.  . bimatoprost (LUMIGAN) 0.01 % SOLN Place 1 drop into both eyes at bedtime.  Marland Kitchen buPROPion (WELLBUTRIN XL) 300 MG 24 hr tablet Take 300 mg by mouth daily. per psychiatry recommendation  . Cholecalciferol (VITAMIN D3) 1000 units CAPS Take 1,000 Units by mouth daily.   . clonazePAM (KLONOPIN) 0.5 MG tablet Take 1 tablet (0.5 mg total) by mouth  at bedtime. (Patient taking differently: Take 0.5 mg by mouth daily. )  . erythromycin ophthalmic ointment Place 1 application into both eyes at bedtime.   Marland Kitchen ketoconazole (NIZORAL) 2 % cream Apply 1 application topically 2 (two) times daily as needed for irritation.   Marland Kitchen levothyroxine (SYNTHROID, LEVOTHROID) 50 MCG tablet Take 50 mcg by mouth daily before breakfast. Take one tablet daily except on Sunday's take 1/2 tablet.  . linaclotide (LINZESS) 145 MCG CAPS capsule Take 1 capsule (145 mcg total) by mouth daily before breakfast.  . loratadine (CLARITIN) 10 MG tablet Take 10 mg by mouth daily.  . Melatonin 3 MG TABS Take 1 tablet by mouth at bedtime.  . mirtazapine (REMERON) 30 MG tablet Take 60 mg by mouth at bedtime.  . Multiple Vitamins-Minerals (ICAPS PO) Take 1 tablet by mouth daily.  Vladimir Faster Glycol-Propyl Glycol (SYSTANE) 0.4-0.3 % SOLN Apply 1 drop to eye 4 (four) times daily.  . polyethylene glycol (MIRALAX / GLYCOLAX) packet Take 17 g by mouth daily. Hold for loose stool  . sertraline (ZOLOFT) 100 MG tablet Take 200 mg by mouth every morning.   . tamsulosin (FLOMAX) 0.4 MG CAPS capsule TAKE 1 CAPSULE (0.4  MG TOTAL) BY MOUTH DAILY.  Marland Kitchen topiramate (TOPAMAX) 25 MG tablet TAKE 1 TABLET BY MOUTH EVERY MORNING AND 2 TABLETS BY MOUTH EVERY EVENING   No facility-administered encounter medications on file as of 10/25/2018.    ROS was provided with assistance of staff.  Review of Systems  Constitutional: Negative for activity change, appetite change, chills, diaphoresis, fatigue, fever and unexpected weight change.  HENT: Positive for hearing loss. Negative for congestion and voice change.   Eyes: Positive for visual disturbance.  Respiratory: Negative for cough, shortness of breath and wheezing.   Cardiovascular: Negative for chest pain, palpitations and leg swelling.  Gastrointestinal: Negative for abdominal distention, abdominal pain, constipation, diarrhea, nausea and vomiting.  Genitourinary: Negative for difficulty urinating, dysuria and urgency.  Musculoskeletal: Positive for arthralgias and gait problem.  Skin: Negative for color change and pallor.  Neurological: Positive for tremors. Negative for dizziness, speech difficulty, weakness and headaches.       Memory lapses.   Psychiatric/Behavioral: Negative for agitation, behavioral problems, hallucinations and sleep disturbance. The patient is not nervous/anxious.     Immunization History  Administered Date(s) Administered  . DTaP 11/05/2005  . Hepatitis B 12/12/1932  . Influenza Split 11/12/2011, 11/29/2013  . Influenza Whole 10/30/2007, 11/12/2008, 11/26/2009  . Influenza, High Dose Seasonal PF 12/31/2015, 11/24/2016  . Influenza,inj,quad, With Preservative 10/16/2016  . Influenza-Unspecified 11/28/2017  . PPD Test 06/23/2011  . Pneumococcal Conjugate-13 01/14/2016  . Pneumococcal Polysaccharide-23 02/16/1999  . Tdap 01/13/2017  . Varicella 11/09/2005  . Zoster 08/30/2006   Pertinent  Health Maintenance Due  Topic Date Due  . INFLUENZA VACCINE  09/16/2018  . PNA vac Low Risk Adult  Completed   Fall Risk  10/28/2017 10/27/2016  06/09/2016 02/03/2016 10/28/2015  Falls in the past year? Yes Yes Yes Yes Yes  Number falls in past yr: 1 2 or more 2 or more 2 or more 2 or more  Comment - - - 2 falls in last 2 months -  Injury with Fall? Yes Yes No Yes No  Comment - L hip - scraps and bruises -  Risk Factor Category  - - - - -  Risk for fall due to : - - - - Impaired balance/gait  Follow up - - - - -   Functional  Status Survey:    Vitals:   10/25/18 0926  BP: 111/62  Pulse: 64  Resp: 18  Temp: 97.9 F (36.6 C)  SpO2: 96%  Weight: 146 lb 8 oz (66.5 kg)  Height: 6' (1.829 m)   Body mass index is 19.87 kg/m. Physical Exam Vitals signs and nursing note reviewed.  Constitutional:      General: He is not in acute distress.    Appearance: Normal appearance. He is normal weight. He is not ill-appearing or toxic-appearing.  HENT:     Head: Normocephalic and atraumatic.     Nose: Nose normal.     Mouth/Throat:     Mouth: Mucous membranes are moist.  Eyes:     Extraocular Movements: Extraocular movements intact.     Conjunctiva/sclera: Conjunctivae normal.     Pupils: Pupils are equal, round, and reactive to light.     Comments: Legally blind left eye.   Neck:     Musculoskeletal: Normal range of motion and neck supple.  Cardiovascular:     Rate and Rhythm: Normal rate and regular rhythm.     Heart sounds: No murmur.  Pulmonary:     Breath sounds: No wheezing, rhonchi or rales.  Chest:     Chest wall: No tenderness.  Abdominal:     General: Bowel sounds are normal.     Palpations: Abdomen is soft.     Tenderness: There is no abdominal tenderness. There is no right CVA tenderness, left CVA tenderness or guarding.  Musculoskeletal:     Right lower leg: No edema.     Left lower leg: No edema.     Comments: W/c for mobility.   Skin:    General: Skin is warm and dry.  Neurological:     General: No focal deficit present.     Mental Status: He is alert. Mental status is at baseline.     Cranial Nerves:  No cranial nerve deficit.     Motor: No weakness.     Coordination: Coordination normal.     Gait: Gait abnormal.     Comments: Oriented to person, place.   Psychiatric:        Mood and Affect: Mood normal.        Behavior: Behavior normal.        Thought Content: Thought content normal.     Labs reviewed: Recent Labs    03/16/18 03/23/18 04/07/18 09/11/18  NA 144 139 142 142  K 4.5 4.2 4.3 4.1  CL 111  --   --  112  CO2 27  --   --  23  BUN 38* 40* 34* 34*  CREATININE 1.4* 1.4* 1.2 1.3  CALCIUM 9.0  --   --  8.4   Recent Labs    03/16/18 03/23/18 04/07/18 09/11/18  AST 15 19 20 18   ALT 13 17 18 12   ALKPHOS 36 40 42 44  PROT 5.5  --   --  5.8  ALBUMIN 3.3  --   --  3.5   Recent Labs    03/23/18 04/07/18 09/11/18  WBC 6.7 5.5 4.7  HGB 13.9 14.2 14.0  HCT 42 44 42  PLT 110* 159 145*   Lab Results  Component Value Date   TSH 1.77 09/11/2018   Lab Results  Component Value Date   HGBA1C 5 01/26/2018   Lab Results  Component Value Date   CHOL 151 09/11/2018   HDL 39 09/11/2018   LDLCALC 93 09/11/2018  LDLDIRECT 132.5 09/08/2012   TRIG 99 09/11/2018   CHOLHDL 2.7 11/15/2016    Significant Diagnostic Results in last 30 days:  No results found.  Assessment/Plan Migraine headache Stable, continue Topamax 25mg  qd.   Hypertension Blood pressure is controlled, continue Amlodipine 10mg  qd.   Chronic constipation  stable, continue MiraLax qd, Linzess 148mcg qd.  Hypothyroidism Stable, continue Levothyroxine 25mcg qd, last TSH 1.77 09/11/18  Benign prostatic hyperplasia without lower urinary tract symptoms No urinary retention, continue Tamsulosin 0.4mg  qd  Major depression, recurrent, chronic (HCC)  His mood is stable, continue Sertraline 200mg  qd, Clonazepam 0.5mg  qhs, Wellbutrin 300mg  qd, Mirtazapine 60g qd.   Hip pain Chronic, continue Tylenol 1000mg  tid, w/c for mobility.      Family/ staff Communication: plan of care reviewed with the patient  and charge nurse.   Labs/tests ordered:  none  Time spend 25 minutes.

## 2018-10-25 NOTE — Assessment & Plan Note (Signed)
No urinary retention, continue Tamsulosin 0.4mg qd.  

## 2018-11-09 ENCOUNTER — Telehealth: Payer: Self-pay | Admitting: Internal Medicine

## 2018-11-09 NOTE — Telephone Encounter (Signed)
Wife called to give confidential info that she suspects "Jose Leach" is having spells of hallucinations.  Yesterday(9/23//20) he thought his epidsode was real. Wife is concern medication needs adjusting.  Please call to advise.  Vilinda Blanks

## 2018-11-10 ENCOUNTER — Other Ambulatory Visit: Payer: Self-pay | Admitting: *Deleted

## 2018-11-10 MED ORDER — CLONAZEPAM 0.5 MG PO TABS: 0.5000 mg | ORAL_TABLET | Freq: Every day | ORAL | 0 refills | Status: DC

## 2018-11-10 NOTE — Telephone Encounter (Signed)
Received fax from [facility] and sent to Dr. Gupta for approval.  

## 2018-11-10 NOTE — Telephone Encounter (Signed)
Will see the Patient next week and review his Symptoms

## 2018-11-17 ENCOUNTER — Encounter: Payer: Self-pay | Admitting: Nurse Practitioner

## 2018-11-17 ENCOUNTER — Non-Acute Institutional Stay (SKILLED_NURSING_FACILITY): Payer: Medicare Other | Admitting: Nurse Practitioner

## 2018-11-17 DIAGNOSIS — E039 Hypothyroidism, unspecified: Secondary | ICD-10-CM | POA: Diagnosis not present

## 2018-11-17 DIAGNOSIS — I10 Essential (primary) hypertension: Secondary | ICD-10-CM

## 2018-11-17 DIAGNOSIS — K5909 Other constipation: Secondary | ICD-10-CM | POA: Diagnosis not present

## 2018-11-17 DIAGNOSIS — N400 Enlarged prostate without lower urinary tract symptoms: Secondary | ICD-10-CM

## 2018-11-17 DIAGNOSIS — G250 Essential tremor: Secondary | ICD-10-CM

## 2018-11-17 DIAGNOSIS — F339 Major depressive disorder, recurrent, unspecified: Secondary | ICD-10-CM

## 2018-11-17 NOTE — Progress Notes (Addendum)
Location:   [facility] Room Number: 37 Place of Service:  SNF (31) Provider:  Jonae Renshaw NP [name], MD  Patient Care Team: [name], MD as PCP - General (Internal Medicine) [name], MD as Consulting Physician (Urology) [name], MD as Consulting Physician (Neurology) [name], MD as Consulting Physician (Dermatology) [name], MD (Inactive) as Consulting Physician (Gastroenterology) [name], MD as Consulting Physician (Orthopedic Surgery) [name], MD as Consulting Physician (Cardiology) [name], MD as Consulting Physician (Ophthalmology) [name], MD as Referring Physician (Psychiatry) [name], MD as Consulting Physician (General Surgery) [name], MD as Consulting Physician (Orthopedic Surgery) [name], [name], NP as Nurse Practitioner (Family Medicine) Vishaal Strollo X, NP as Nurse Practitioner (Internal Medicine)  Extended Emergency Contact Information Primary Emergency Contact: Huegel,[name] Address: [address]          [place], [place] [address] [place] Phone: [phone] Relation: Spouse  Code Status:  Full Code Goals of care: Advanced Directive information Advanced Directives 11/20/2018  Does Patient Have a Medical Advance Directive? Yes  Type of Advance Directive Living will;Healthcare Power of Attorney  Does patient want to make changes to medical advance directive? No - Patient declined  Copy of [place] in Chart? Yes - validated most recent copy scanned in chart (See row information)  Pre-existing out of facility DNR order (yellow form or pink MOST form) -     Chief Complaint  Patient presents with  . Medical Management of Chronic Issues    HPI:  Pt is a 85 y.o. male seen today for medical management of chronic diseases.    The patient resides in SNF [facility] for safety, care assistance. His mood is  stable, on Sertraline 200mg  qd, Mirtazapine 60mg  qd, Clonazepam 0.5mg  qhs, Wellbutrin 300mg  qd. Hypothyroidism, on Levothyroxine 87mcg qd, last TSH wnl 08/2018. Constipation, stable, on Linzess 171mcg qd, MiraLax qd. BPH, no urinary retention, on Tamsulosin 0.4mg  qd. Essential tremor, not disabling,  taking Topamax 25mg  qam, 50mg  qpm. HTN, blood pressure is controlled, on Amlodipine 10mg  qd.    Past Medical History:  Diagnosis Date  . Acute urinary retention 01/28/2013  . AKI (acute kidney injury) ([place]) 01/26/2013  . Altered mental status 09/09/2013  . Anemia    B12 deficiency  . ANXIETY 06/07/2007   Qualifier: Diagnosis of  By: Talbert Cage CMA ([place]), June    . ARTHRITIS 06/07/2007   Qualifier: Diagnosis of  By: Talbert Cage CMA ([place]), June    . Balance problems 04/05/2011  . Benign essential tremor   . Benign fibroma of prostate 10/11/2011  . Bilateral inguinal hernia 10/09/2012  . Bilateral leg edema 09/13/2012  . Bladder retention 02/05/2013  . Bradycardia, sinus 08/02/2012  . CAROTID ARTERY DISEASE 03/31/2007   Qualifier: Diagnosis of  By: Linna Darner MD, Gwyndolyn Saxon    . Carotid stenosis    s/p L CEA  . Cataract, nuclear 03/21/2014  . 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 Cervical spinal stenosis   . Cervical stenosis of spine   . Critical lower limb ischemia   . Degeneration macular 11/02/2011  . Depression    Dr Albertine Patricia  . Difficulty in walking(719.7) 04/05/2011  . ESOPHAGITIS 08/06/2002   Qualifier: Diagnosis of  By: Talbert Cage CMA ([place]), June    . Essential and other specified forms of tremor 04/18/2013  . Fall at home 01/06/2017  left hip pain  . Falls   . Family history of colon cancer 07/24/2012  . Fecal impaction ([place]) 10/11/2012  . Femoral hernia, bilateral s/p lap repair 01/16/2013 01/16/2013  . Glaucoma, compensated 03/21/2014  . H/O cardiovascular stress test    a. 02/2003 -> no ischemia/infarct.  Marland Kitchen HAMMER TOE 04/23/2010   Qualifier: Diagnosis of  By: Linna Darner MD, Gwyndolyn Saxon    . Hematoma of leg    . Hip hematoma, right 09/09/2013  . History of shingles 2009  . HTN (hypertension) 08/22/2012  . Hx of echocardiogram    Echocardiogram 08/01/12: Mild LVH, EF 55-60%, normal wall motion.  . Hydrocele 10/11/2011  . Hyperlipidemia   . 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 Hypothyroidism   . Macular degeneration disease   . Macular edema   . Migraines    Dr Jannifer Franklin  . MORTON'S NEUROMA, LEFT 08/22/2007   Qualifier: Diagnosis of  By: Linna Darner MD, Gwyndolyn Saxon    . PAD (peripheral artery disease) ([place])   . Peripheral neuropathy   . Personal history of colonic polyps 01/05/2013   2014 2 mm adenomatous polyp   . PREMATURE ATRIAL CONTRACTIONS 03/14/2006   Qualifier: Diagnosis of  By: Linna Darner MD, Gwendolyn Lima 12/16/2011  . SPINAL STENOSIS 03/14/2006   Qualifier: Diagnosis of  By: Linna Darner MD, William   Cervical spine    . Testosterone deficiency    Dr Lawerance Bach, [facility]  . Unspecified vitamin D deficiency 08/06/2012  . Urge incontinence 10/09/2012  . Urinary tract infection, site not specified 02/13/2013  . Weakness 08/02/2012   Past Surgical History:  Procedure Laterality Date  . APPENDECTOMY  1941  . BREAST CYST EXCISION Left 01/16/2013   Procedure: MASS EXCISION AXILLA;  Surgeon: Adin Hector, MD;  Location: [place];  Service: General;  Laterality: Left;  . CAROTID ENDARTERECTOMY  2005    L  . CATARACT EXTRACTION Right   . COLONOSCOPY  2014   Dr. Deatra Ina  . EYE SURGERY Right 02/2012   retina  . INGUINAL HERNIA REPAIR Bilateral 01/16/2013   Procedure: LAPAROSCOPIC BILATERAL INGUINAL DIRECT AND INDIRECT, FEMORAL, AND OBTURATOR HERNIAS;  Surgeon: Adin Hector, MD;  Location: [place];  Service: General;  Laterality: Bilateral;  . INSERTION OF MESH N/A 01/16/2013   Procedure: INSERTION OF MESH;  Surgeon: Adin Hector, MD;  Location: [place];  Service: General;  Laterality: N/A;  . LUMBAR LAMINECTOMY    . TONSILLECTOMY AND ADENOIDECTOMY    . TOTAL HIP ARTHROPLASTY  2005   L    Allergies   Allergen Reactions  . Latex Rash  . Tape Rash    Paper tape is ok to use     Allergies as of 11/17/2018      Reactions   Latex Rash   Tape Rash   Paper tape is ok to use      Medication List       Accurate as of November 17, 2018 11:59 PM. If you have any questions, ask your nurse or doctor.        acetaminophen 500 MG tablet Commonly known as: TYLENOL Take 1,000 mg by mouth 3 (three) times daily.   amLODipine 10 MG tablet Commonly known as: NORVASC Take 10 mg by mouth daily.   aspirin 81 MG tablet Take 81 mg by mouth every morning.   atorvastatin 20 MG tablet Commonly known as: LIPITOR Take 20 mg by mouth daily.   B-complex with vitamin C tablet Take 1 tablet  by mouth daily.   bimatoprost 0.01 % Soln Commonly known as: LUMIGAN Place 1 drop into both eyes at bedtime.   buPROPion 300 MG 24 hr tablet Commonly known as: WELLBUTRIN XL Take 300 mg by mouth daily. per psychiatry recommendation   clonazePAM 0.5 MG tablet Commonly known as: KLONOPIN Take 1 tablet (0.5 mg total) by mouth at bedtime.   erythromycin ophthalmic ointment Place 1 application into both eyes at bedtime.   ICAPS PO Take 1 tablet by mouth daily.   ketoconazole 2 % cream Commonly known as: NIZORAL Apply 1 application topically 2 (two) times daily as needed for irritation.   levothyroxine 50 MCG tablet Commonly known as: SYNTHROID Take 50 mcg by mouth daily before breakfast. Take 1/2 tablet on Sunday mornings and 1 on other days.   linaclotide 145 MCG Caps capsule Commonly known as: LINZESS Take 1 capsule (145 mcg total) by mouth daily before breakfast.   loratadine 10 MG tablet Commonly known as: CLARITIN Take 10 mg by mouth daily.   Melatonin 3 MG Tabs Take 1 tablet by mouth at bedtime.   mirtazapine 30 MG tablet Commonly known as: REMERON Take 60 mg by mouth at bedtime.   polyethylene glycol 17 g packet Commonly known as: MIRALAX / GLYCOLAX Take 17 g by mouth daily. Hold  for loose stool   sertraline 100 MG tablet Commonly known as: ZOLOFT Take 200 mg by mouth every morning.   Systane 0.4-0.3 % Soln Generic drug: Polyethyl Glycol-Propyl Glycol Apply 1 drop to eye 4 (four) times daily.   tamsulosin 0.4 MG Caps capsule Commonly known as: FLOMAX TAKE 1 CAPSULE (0.4 MG TOTAL) BY MOUTH DAILY.   topiramate 25 MG tablet Commonly known as: TOPAMAX TAKE 1 TABLET BY MOUTH EVERY MORNING AND 2 TABLETS BY MOUTH EVERY EVENING   Vitamin D3 25 MCG (1000 UT) Caps Take 1,000 Units by mouth daily.     ROS was provided with assistance of staff.   Review of Systems  Constitutional: Negative for activity change, appetite change, chills, diaphoresis, fatigue and fever.  HENT: Positive for hearing loss. Negative for voice change.   Eyes: Positive for visual disturbance.  Respiratory: Negative for cough, shortness of breath and wheezing.   Cardiovascular: Negative for chest pain, palpitations and leg swelling.  Gastrointestinal: Negative for abdominal distention, abdominal pain, constipation, diarrhea, nausea and vomiting.  Genitourinary: Negative for difficulty urinating, dysuria and urgency.  Musculoskeletal: Positive for arthralgias and gait problem.       On and off left 2-3 costochondritis pain.    Skin: Negative for color change and pallor.  Neurological: Positive for tremors. Negative for dizziness, speech difficulty, weakness and headaches.       Memory lapses.   Psychiatric/Behavioral: Negative for agitation, behavioral problems, hallucinations and sleep disturbance. The patient is not nervous/anxious.     Immunization History  Administered Date(s) Administered  . DTaP 11/05/2005  . Hepatitis B 1932-12-04  . Influenza Split 11/12/2011, 11/29/2013  . Influenza Whole 10/30/2007, 11/12/2008, 11/26/2009  . Influenza, High Dose Seasonal PF 12/31/2015, 11/24/2016  . Influenza,inj,quad, With Preservative 10/16/2016  . Influenza-Unspecified 11/28/2017  . PPD  Test 06/23/2011  . Pneumococcal Conjugate-13 01/14/2016  . Pneumococcal Polysaccharide-23 02/16/1999  . Tdap 01/13/2017  . Varicella 11/09/2005  . Zoster 08/30/2006   Pertinent  Health Maintenance Due  Topic Date Due  . INFLUENZA VACCINE  09/16/2018  . PNA vac Low Risk Adult  Completed   Fall Risk  10/28/2017 10/27/2016 06/09/2016 02/03/2016 10/28/2015  Falls  in the past year? Yes Yes Yes Yes Yes  Number falls in past yr: 1 2 or more 2 or more 2 or more 2 or more  Comment - - - 2 falls in last 2 months -  Injury with Fall? Yes Yes No Yes No  Comment - L hip - scraps and bruises -  Risk Factor Category  - - - - -  Risk for fall due to : - - - - Impaired balance/gait  Follow up - - - - -   Functional Status Survey:    Vitals:   11/17/18 1354  BP: 107/64  Pulse: 70  Resp: 18  Temp: 98.2 F (36.8 C)  SpO2: 94%  Weight: 146 lb 14.4 oz (66.6 kg)  Height: 6' (1.829 m)   Body mass index is 19.92 kg/m. Physical Exam Vitals signs and nursing note reviewed.  Constitutional:      General: He is not in acute distress.    Appearance: Normal appearance. He is normal weight. He is not ill-appearing, toxic-appearing or diaphoretic.  HENT:     Head: Normocephalic.     Nose: Nose normal.     Mouth/Throat:     Mouth: Mucous membranes are moist.  Eyes:     Extraocular Movements: Extraocular movements intact.     Conjunctiva/sclera: Conjunctivae normal.     Pupils: Pupils are equal, round, and reactive to light.     Comments: Blind left eye  Neck:     Musculoskeletal: Normal range of motion.  Cardiovascular:     Rate and Rhythm: Normal rate and regular rhythm.     Heart sounds: No murmur.  Pulmonary:     Effort: Pulmonary effort is normal.     Breath sounds: No wheezing, rhonchi or rales.  Abdominal:     General: Bowel sounds are normal.     Palpations: Abdomen is soft.     Tenderness: There is no abdominal tenderness. There is no right CVA tenderness, left CVA tenderness,  guarding or rebound.  Musculoskeletal:     Right lower leg: No edema.     Left lower leg: No edema.     Comments: The left 2nd, 3rd costochondritis pain palpated, the patient declined treatment.    Skin:    General: Skin is warm and dry.  Neurological:     General: No focal deficit present.     Mental Status: He is alert. Mental status is at baseline.     Cranial Nerves: No cranial nerve deficit.     Motor: No weakness.     Coordination: Coordination normal.     Gait: Gait abnormal.     Comments: Oriented to person, place.  Psychiatric:        Mood and Affect: Mood normal.        Behavior: Behavior normal.        Thought Content: Thought content normal.     Labs reviewed: Recent Labs    03/16/18 03/23/18 04/07/18 09/11/18  NA 144 139 142 142  K 4.5 4.2 4.3 4.1  CL 111  --   --  112  CO2 27  --   --  23  BUN 38* 40* 34* 34*  CREATININE 1.4* 1.4* 1.2 1.3  CALCIUM 9.0  --   --  8.4   Recent Labs    03/16/18 03/23/18 04/07/18 09/11/18  AST 15 19 20 18   ALT 13 17 18 12   ALKPHOS 36 40 42 44  PROT 5.5  --   --  5.8  ALBUMIN 3.3  --   --  3.5   Recent Labs    03/23/18 04/07/18 09/11/18  WBC 6.7 5.5 4.7  HGB 13.9 14.2 14.0  HCT 42 44 42  PLT 110* 159 145*   Lab Results  Component Value Date   TSH 1.77 09/11/2018   Lab Results  Component Value Date   HGBA1C 5 01/26/2018   Lab Results  Component Value Date   CHOL 151 09/11/2018   HDL 39 09/11/2018   LDLCALC 93 09/11/2018   LDLDIRECT 132.5 09/08/2012   TRIG 99 09/11/2018   CHOLHDL 2.7 11/15/2016    Significant Diagnostic Results in last 30 days:  No results found.  Assessment/Plan Chronic constipation  stable, continue Linzess 122mcg qd, MiraLax qd.   Hypothyroidism Stable, last TSH wnl 08/2018, continue Levothyroxine 9mcg qd.   Benign prostatic hyperplasia without lower urinary tract symptoms Stable, no urinary retention, continue Tamsulosin 0.4mg  qd.   Major depression, recurrent, chronic (HCC)  His mood is stable, continue Sertraline 200mg  qd, Mirtazapine 60mg  qd, Clonazepam 0.5mg  qhs, Wellbutrin 300mg  qd.   Hypertension Blood pressure is controlled, continue Amlodipine 10mg  qd.   Benign essential tremor stable, continue Topamax 25mg  qam, 50mg  qpm.     Family/ staff Communication: plan of care reviewed with the patient and charge nurse.   Labs/tests ordered:  none  Time spend 25 minutes.

## 2018-11-17 NOTE — Assessment & Plan Note (Signed)
stable, continue Topamax 25mg  qam, 50mg  qpm.

## 2018-11-17 NOTE — Assessment & Plan Note (Signed)
Stable, last TSH wnl 08/2018, continue Levothyroxine 23mcg qd.

## 2018-11-17 NOTE — Assessment & Plan Note (Signed)
Stable, no urinary retention, continue Tamsulosin 0.4mg qd.  

## 2018-11-17 NOTE — Assessment & Plan Note (Signed)
stable, continue Linzess 145mcg qd, MiraLax qd. 

## 2018-11-17 NOTE — Assessment & Plan Note (Signed)
His mood is stable, continue Sertraline 200mg  qd, Mirtazapine 60mg  qd, Clonazepam 0.5mg  qhs, Wellbutrin 300mg  qd.

## 2018-11-17 NOTE — Assessment & Plan Note (Deleted)
stable, continue Topamax 25mg  qam, 50mg  qpm.

## 2018-11-17 NOTE — Assessment & Plan Note (Signed)
Blood pressure is controlled, continue Amlodipine 10mg qd.  

## 2018-11-20 ENCOUNTER — Encounter: Payer: Self-pay | Admitting: Internal Medicine

## 2018-11-20 ENCOUNTER — Non-Acute Institutional Stay (SKILLED_NURSING_FACILITY): Payer: Medicare Other | Admitting: Internal Medicine

## 2018-11-20 DIAGNOSIS — I10 Essential (primary) hypertension: Secondary | ICD-10-CM | POA: Diagnosis not present

## 2018-11-20 DIAGNOSIS — R441 Visual hallucinations: Secondary | ICD-10-CM

## 2018-11-20 DIAGNOSIS — L729 Follicular cyst of the skin and subcutaneous tissue, unspecified: Secondary | ICD-10-CM

## 2018-11-20 DIAGNOSIS — F339 Major depressive disorder, recurrent, unspecified: Secondary | ICD-10-CM | POA: Diagnosis not present

## 2018-11-20 NOTE — Progress Notes (Signed)
Location:    Nursing Home Room Number: 37 Place of Service:  SNF [phone]) Provider:  Veleta Miners MD  Jose Dad, MD  Patient Care Team: Jose Dad, MD as PCP - General (Internal Medicine) [name], MD as Consulting Physician (Urology) [name], MD as Consulting Physician (Neurology) [name], MD as Consulting Physician (Dermatology) [name], MD (Inactive) as Consulting Physician (Gastroenterology) [name], MD as Consulting Physician (Orthopedic Surgery) [name], MD as Consulting Physician (Cardiology) [name], MD as Consulting Physician (Ophthalmology) [name], MD as Referring Physician (Psychiatry) [name], MD as Consulting Physician (General Surgery) [name], MD as Consulting Physician (Orthopedic Surgery) [name], [name], NP as Nurse Practitioner (Family Medicine) Mast, Man X, NP as Nurse Practitioner (Internal Medicine)  Extended Emergency Contact Information Primary Emergency Contact: Jose Leach Address: [address]          [place]          [place], [place] [address] [place] Phone: [phone] Relation: Spouse  Code Status:  Full code Goals of care: Advanced Directive information Advanced Directives 11/20/2018  Does Patient Have a Medical Advance Directive? Yes  Type of Advance Directive Living will;Healthcare Power of Attorney  Does patient want to make changes to medical advance directive? No - Patient declined  Copy of [place] in Chart? Yes - validated most recent copy scanned in chart (See row information)  Pre-existing out of facility DNR order (yellow form or pink MOST form) -     Chief Complaint  Patient presents with  . Acute Visit    Hallucination and scrotal Cyst    HPI:  Pt is a 85 y.o. male seen today for an acute visit for Visual Hallucinations and cyst felt in Left side of Scrotum He has h/o HTN, Hyperlipidemia,  PAD, BPH , CKD stage 2, Hypothyroidism, and Major Depression Also Has h/o Cervial Stenosis with Compressive Myelopathy, BPH  Patient was seen today as nurses had noticed a small cyst on his scrotum on the left side.  Patient denies any pain or redness.  Patient does have hydrocele which is chronic. He also has been having visual hallucinations. Per Him and his wife he gets very depressed due to the Covid restrictions.  Inability to see his wife.  He told me that he feels like he is worried he will be sent somewhere for surgery or his condition would worsen.  He is very paranoid.  He is aware of his hallucinations His weight has improved in the past few months. Denied any cough pain chest pain or fever Past Medical History:  Diagnosis Date  . Acute urinary retention 01/28/2013  . AKI (acute kidney injury) ([place]) 01/26/2013  . Altered mental status 09/09/2013  . Anemia    B12 deficiency  . ANXIETY 06/07/2007   Qualifier: Diagnosis of  By: Talbert Cage CMA ([place]), June    . ARTHRITIS 06/07/2007   Qualifier: Diagnosis of  By: Talbert Cage CMA ([place]), June    . Balance problems 04/05/2011  . Benign essential tremor   . Benign fibroma of prostate 10/11/2011  . Bilateral inguinal hernia 10/09/2012  . Bilateral leg edema 09/13/2012  . Bladder retention 02/05/2013  . Bradycardia, sinus 08/02/2012  . CAROTID ARTERY DISEASE 03/31/2007   Qualifier: Diagnosis of  By: Linna Darner MD, Gwyndolyn Saxon    . Carotid stenosis    s/p L CEA  . Cataract, nuclear 03/21/2014  . 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  Cervical spinal stenosis   . Cervical stenosis of spine   . Critical lower limb ischemia   . Degeneration macular 11/02/2011  . Depression    Dr Albertine Patricia  . Difficulty in walking(719.7) 04/05/2011  . ESOPHAGITIS 08/06/2002   Qualifier: Diagnosis of  By: Talbert Cage CMA ([place]), June    . Essential and other specified forms of tremor 04/18/2013  . Fall at home 01/06/2017   left hip pain  . Falls   . Family history of colon cancer  07/24/2012  . Fecal impaction ([place]) 10/11/2012  . Femoral hernia, bilateral s/p lap repair 01/16/2013 01/16/2013  . Glaucoma, compensated 03/21/2014  . H/O cardiovascular stress test    a. 02/2003 -> no ischemia/infarct.  Jose Leach HAMMER TOE 04/23/2010   Qualifier: Diagnosis of  By: Linna Darner MD, Gwyndolyn Saxon    . Hematoma of leg   . Hip hematoma, right 09/09/2013  . History of shingles 2009  . HTN (hypertension) 08/22/2012  . Hx of echocardiogram    Echocardiogram 08/01/12: Mild LVH, EF 55-60%, normal wall motion.  . Hydrocele 10/11/2011  . Hyperlipidemia   . 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 Hypothyroidism   . Macular degeneration disease   . Macular edema   . Migraines    Dr Jannifer Franklin  . MORTON'S NEUROMA, LEFT 08/22/2007   Qualifier: Diagnosis of  By: Linna Darner MD, Gwyndolyn Saxon    . PAD (peripheral artery disease) ([place])   . Peripheral neuropathy   . Personal history of colonic polyps 01/05/2013   2014 2 mm adenomatous polyp   . PREMATURE ATRIAL CONTRACTIONS 03/14/2006   Qualifier: Diagnosis of  By: Linna Darner MD, Gwendolyn Lima 12/16/2011  . SPINAL STENOSIS 03/14/2006   Qualifier: Diagnosis of  By: Linna Darner MD, William   Cervical spine    . Testosterone deficiency    Dr Lawerance Bach, [facility]  . Unspecified vitamin D deficiency 08/06/2012  . Urge incontinence 10/09/2012  . Urinary tract infection, site not specified 02/13/2013  . Weakness 08/02/2012   Past Surgical History:  Procedure Laterality Date  . APPENDECTOMY  1941  . BREAST CYST EXCISION Left 01/16/2013   Procedure: MASS EXCISION AXILLA;  Surgeon: Adin Hector, MD;  Location: [facility];  Service: General;  Laterality: Left;  . CAROTID ENDARTERECTOMY  2005    L  . CATARACT EXTRACTION Right   . COLONOSCOPY  2014   Dr. Deatra Ina  . EYE SURGERY Right 02/2012   retina  . INGUINAL HERNIA REPAIR Bilateral 01/16/2013   Procedure: LAPAROSCOPIC BILATERAL INGUINAL DIRECT AND INDIRECT, FEMORAL, AND OBTURATOR HERNIAS;  Surgeon: Adin Hector, MD;  Location: [facility];   Service: General;  Laterality: Bilateral;  . INSERTION OF MESH N/A 01/16/2013   Procedure: INSERTION OF MESH;  Surgeon: Adin Hector, MD;  Location: [facility];  Service: General;  Laterality: N/A;  . LUMBAR LAMINECTOMY    . TONSILLECTOMY AND ADENOIDECTOMY    . TOTAL HIP ARTHROPLASTY  2005   L    Allergies  Allergen Reactions  . Latex Rash  . Tape Rash    Paper tape is ok to use     Allergies as of 11/20/2018      Reactions   Latex Rash   Tape Rash   Paper tape is ok to use      Medication List       Accurate as of November 20, 2018  2:13 PM. If you have any questions, ask your nurse or doctor.  acetaminophen 500 MG tablet Commonly known as: TYLENOL Take 1,000 mg by mouth 3 (three) times daily.   amLODipine 10 MG tablet Commonly known as: NORVASC Take 10 mg by mouth daily.   aspirin 81 MG tablet Take 81 mg by mouth every morning.   atorvastatin 20 MG tablet Commonly known as: LIPITOR Take 20 mg by mouth daily.   B-complex with vitamin C tablet Take 1 tablet by mouth daily.   bimatoprost 0.01 % Soln Commonly known as: LUMIGAN Place 1 drop into both eyes at bedtime.   buPROPion 300 MG 24 hr tablet Commonly known as: WELLBUTRIN XL Take 300 mg by mouth daily. per psychiatry recommendation   clonazePAM 0.5 MG tablet Commonly known as: KLONOPIN Take 1 tablet (0.5 mg total) by mouth at bedtime.   erythromycin ophthalmic ointment Place 1 application into both eyes at bedtime.   ICAPS PO Take 1 tablet by mouth daily.   ketoconazole 2 % cream Commonly known as: NIZORAL Apply 1 application topically 2 (two) times daily as needed for irritation.   levothyroxine 50 MCG tablet Commonly known as: SYNTHROID Take 50 mcg by mouth daily before breakfast. Take 1/2 tablet on Sunday mornings and 1 on other days.   linaclotide 145 MCG Caps capsule Commonly known as: LINZESS Take 1 capsule (145 mcg total) by mouth daily before breakfast.   loratadine 10 MG  tablet Commonly known as: CLARITIN Take 10 mg by mouth daily.   Melatonin 3 MG Tabs Take 1 tablet by mouth at bedtime.   mirtazapine 30 MG tablet Commonly known as: REMERON Take 60 mg by mouth at bedtime.   polyethylene glycol 17 g packet Commonly known as: MIRALAX / GLYCOLAX Take 17 g by mouth daily. Hold for loose stool   sertraline 100 MG tablet Commonly known as: ZOLOFT Take 200 mg by mouth every morning.   Systane 0.4-0.3 % Soln Generic drug: Polyethyl Glycol-Propyl Glycol Apply 1 drop to eye 4 (four) times daily.   tamsulosin 0.4 MG Caps capsule Commonly known as: FLOMAX TAKE 1 CAPSULE (0.4 MG TOTAL) BY MOUTH DAILY.   topiramate 25 MG tablet Commonly known as: TOPAMAX TAKE 1 TABLET BY MOUTH EVERY MORNING AND 2 TABLETS BY MOUTH EVERY EVENING   Vitamin D3 25 MCG (1000 UT) Caps Take 1,000 Units by mouth daily.       Review of Systems  Constitutional: Negative.   Respiratory: Negative.   Cardiovascular: Negative.   Gastrointestinal: Negative.   Genitourinary: Positive for scrotal swelling.  Musculoskeletal: Negative.   Neurological: Positive for weakness.  Psychiatric/Behavioral: Positive for confusion, dysphoric mood and hallucinations.  All other systems reviewed and are negative.   Immunization History  Administered Date(s) Administered  . DTaP 11/05/2005  . Hepatitis B 1932/12/20  . Influenza Split 11/12/2011, 11/29/2013  . Influenza Whole 10/30/2007, 11/12/2008, 11/26/2009  . Influenza, High Dose Seasonal PF 12/31/2015, 11/24/2016  . Influenza,inj,quad, With Preservative 10/16/2016  . Influenza-Unspecified 11/28/2017  . PPD Test 06/23/2011  . Pneumococcal Conjugate-13 01/14/2016  . Pneumococcal Polysaccharide-23 02/16/1999  . Tdap 01/13/2017  . Varicella 11/09/2005  . Zoster 08/30/2006   Pertinent  Health Maintenance Due  Topic Date Due  . INFLUENZA VACCINE  09/16/2018  . PNA vac Low Risk Adult  Completed   Fall Risk  10/28/2017 10/27/2016  06/09/2016 02/03/2016 10/28/2015  Falls in the past year? Yes Yes Yes Yes Yes  Number falls in past yr: 1 2 or more 2 or more 2 or more 2 or more  Comment - - -  2 falls in last 2 months -  Injury with Fall? Yes Yes No Yes No  Comment - L hip - scraps and bruises -  Risk Factor Category  - - - - -  Risk for fall due to : - - - - Impaired balance/gait  Follow up - - - - -   Functional Status Survey:    Vitals:   11/20/18 1356  BP: 100/70  Pulse: 82  Resp: 19  Temp: 98 F (36.7 C)  SpO2: 97%  Weight: 146 lb 14.4 oz (66.6 kg)  Height: 6' (1.829 m)   Body mass index is 19.92 kg/m. Physical Exam Vitals signs reviewed.  Constitutional:      Appearance: Normal appearance.  HENT:     Head: Normocephalic.     Nose: Nose normal.     Mouth/Throat:     Mouth: Mucous membranes are moist.     Pharynx: Oropharynx is clear.  Eyes:     Pupils: Pupils are equal, round, and reactive to light.  Neck:     Musculoskeletal: Neck supple.  Cardiovascular:     Rate and Rhythm: Normal rate.     Pulses: Normal pulses.  Pulmonary:     Effort: Pulmonary effort is normal.     Breath sounds: Normal breath sounds.  Abdominal:     General: Abdomen is flat. Bowel sounds are normal.     Palpations: Abdomen is soft.  Genitourinary:    Comments: Has Hydrocele with Cyst in left side of Scrotum. Not painful. No redness Musculoskeletal:        General: No swelling.  Skin:    General: Skin is warm and dry.  Neurological:     General: No focal deficit present.     Mental Status: He is alert.  Psychiatric:     Comments: C/o Depression     Labs reviewed: Recent Labs    03/16/18 03/23/18 04/07/18 09/11/18  NA 144 139 142 142  K 4.5 4.2 4.3 4.1  CL 111  --   --  112  CO2 27  --   --  23  BUN 38* 40* 34* 34*  CREATININE 1.4* 1.4* 1.2 1.3  CALCIUM 9.0  --   --  8.4   Recent Labs    03/16/18 03/23/18 04/07/18 09/11/18  AST 15 19 20 18   ALT 13 17 18 12   ALKPHOS 36 40 42 44  PROT 5.5  --   --   5.8  ALBUMIN 3.3  --   --  3.5   Recent Labs    03/23/18 04/07/18 09/11/18  WBC 6.7 5.5 4.7  HGB 13.9 14.2 14.0  HCT 42 44 42  PLT 110* 159 145*   Lab Results  Component Value Date   TSH 1.77 09/11/2018   Lab Results  Component Value Date   HGBA1C 5 01/26/2018   Lab Results  Component Value Date   CHOL 151 09/11/2018   HDL 39 09/11/2018   LDLCALC 93 09/11/2018   LDLDIRECT 132.5 09/08/2012   TRIG 99 09/11/2018   CHOLHDL 2.7 11/15/2016    Significant Diagnostic Results in last 30 days:  No results found.  Assessment/Plan Hallucinations, visual We will check CMP CBC and a TSH Patient does not want anything on chronic basis.  He said he does not get hallucinations all the time. I will try Seroquel 12.5 mg daily for 2 weeks as needed for very bothersome hallucinations as  discussed with the nurse  Scrotal cyst with hydrocele  Would continue to monitor  Major depression, recurrent, chronic (HCC) On high doses of Wellbutrin and Zoloft and Remeron and Klonipin Will make his appointment with his psychiatrist  Essential hypertension Controlled on Norvasc  Hypothyroidism TSH normal in 7/20 Repeat TSH Chronic constipation On Linzess  Chronic  Conjuctivits OnErythromycin ointmentper Ophthalmologist Hyperlipidemia  LDL was 93 Continue Statin Repeat Lipid Panel BPH Symptoms Stable on Flomax Essential Tremors On Topamax  Family/ staff Communication:   Labs/tests ordered:  CMP,CBC,TSH and Lipid Panel Total time spent in this patient care encounter was  _25  minutes; greater than 50% of the visit spent counseling patient and staff, reviewing records , Labs and coordinating care for problems addressed at this encounter.

## 2018-11-23 ENCOUNTER — Telehealth: Payer: Self-pay | Admitting: Neurology

## 2018-11-23 MED ORDER — ARIPIPRAZOLE 2 MG PO TABS: 2.0000 mg | ORAL_TABLET | Freq: Every day | ORAL | 3 refills | Status: DC

## 2018-11-23 NOTE — Addendum Note (Signed)
Addended by: [name] on: 11/23/2018 04:43 PM   Modules accepted: Orders

## 2018-11-23 NOTE — Telephone Encounter (Signed)
RN Supervisor Jose Leach [facility] is asking for  A call as to what can be done for pt.  [name] stated on 08-26 pt saw his psychiatrist and there is a concern re: hallucinations.  [name] accepted 1st available appointment for pt and he is on wait list.  She is asking for a call from [employer]

## 2018-11-23 NOTE — Telephone Encounter (Signed)
Pt's last appt with our office was 07/06/2017. Next visit is currently scheduled for 04/19/2019.Marland Kitchen.(wait list for an earlier appt). [name], Production assistant, radio at [employer] has called in regards to pt's Hallucinations. [name] would like to discuss MD's recommendations until pt is able to be seen.  Best call back # is [phone] ( no vm is available at the ext.)

## 2018-11-23 NOTE — Telephone Encounter (Signed)
I called and talk with the nurse take care of the patient.  The patient began having hallucinations and delusional thinking behavior 2 to 3 weeks ago.  The behaviors are usually worse in the evenings.  The patient is on Wellbutrin, the nursing staff believes that the patient is depressed.  I will add low-dose Abilify, 2 mg in the evening, they will call for dose adjustments.  The order was faxed to [phone].  The patient is at friend's home [place].

## 2018-11-24 LAB — CBC AND DIFFERENTIAL
HCT: 40 — AB (ref 41–53)
Hemoglobin: 13.1 — AB (ref 13.5–17.5)
Platelets: 140 — AB (ref 150–399)
WBC: 4.1

## 2018-11-24 LAB — LIPID PANEL
Cholesterol: 146 (ref 0–200)
HDL: 38 (ref 35–70)
LDL Cholesterol: 89
LDl/HDL Ratio: 3.8
Triglycerides: 91 (ref 40–160)

## 2018-11-24 LAB — TSH: TSH: 2.06 (ref 0.41–5.90)

## 2018-12-07 ENCOUNTER — Other Ambulatory Visit: Payer: Self-pay

## 2018-12-07 MED ORDER — CLONAZEPAM 0.5 MG PO TABS: 0.5000 mg | ORAL_TABLET | Freq: Every day | ORAL | 0 refills | Status: DC

## 2018-12-07 NOTE — Telephone Encounter (Signed)
Last filled in epic on 11/10/2018

## 2018-12-11 ENCOUNTER — Non-Acute Institutional Stay (SKILLED_NURSING_FACILITY): Payer: Medicare Other | Admitting: Internal Medicine

## 2018-12-11 ENCOUNTER — Encounter: Payer: Self-pay | Admitting: Internal Medicine

## 2018-12-11 DIAGNOSIS — R600 Localized edema: Secondary | ICD-10-CM | POA: Diagnosis not present

## 2018-12-11 DIAGNOSIS — M25562 Pain in left knee: Secondary | ICD-10-CM | POA: Diagnosis not present

## 2018-12-11 DIAGNOSIS — F339 Major depressive disorder, recurrent, unspecified: Secondary | ICD-10-CM | POA: Diagnosis not present

## 2018-12-11 NOTE — Progress Notes (Signed)
Location:    [facility] Home Room Number: 37 Place of Service:  SNF [phone]) Provider:  [name], MD  [name], MD  Patient Care Team: [name], MD as PCP - General (Internal Medicine) [name], MD as Consulting Physician (Urology) [name], MD as Consulting Physician (Neurology) [name], MD as Consulting Physician (Dermatology) [name], MD (Inactive) as Consulting Physician (Gastroenterology) [name], MD as Consulting Physician (Orthopedic Surgery) [name], MD as Consulting Physician (Cardiology) [name], MD as Consulting Physician (Ophthalmology) [name], MD as Referring Physician (Psychiatry) [name], MD as Consulting Physician (General Surgery) [name], MD as Consulting Physician (Orthopedic Surgery) [name], [name], NP as Nurse Practitioner (Family Medicine) Mast, Man X, NP as Nurse Practitioner (Internal Medicine)  Extended Emergency Contact Information Primary Emergency Contact: Victorino,[name] Address: [address]          [place], [place] [address] [place] Phone: [phone] Relation: Spouse  Code Status:  Full Code Goals of care: Advanced Directive information Advanced Directives 11/20/2018  Does Patient Have a Medical Advance Directive? Yes  Type of Advance Directive Living will;Healthcare Power of Attorney  Does patient want to make changes to medical advance directive? No - Patient declined  Copy of [place] in Chart? Yes - validated most recent copy scanned in chart (See row information)  Pre-existing out of facility DNR order (yellow form or pink MOST form) -     Chief Complaint  Patient presents with  . Acute Visit    Left knee pain and edema     HPI:  Pt is a 85 y.o. male seen today for an acute visit for Left Knee pain and Bilateral LE edema  Patient is Long term Resident of SNF. Marland KitchenHis wife lives  in [place]. He has h/o HTN, Hyperlipidemia, PAD, BPH , CKD stage 2, Hypothyroidism, and Major Depression with Hallucinations.  Also Has h/o Cervial Stenosis with Compressive Myelopathy, BPH  He c/o Left Knee since he fell few weeks ago.  He is walking with therapy. Was c/o Muscle pain today also around his Left knee. Knee not swollen or red He was started on Abilify by Neurology . Says his Hallucinations are much improved since then Pharmacy is concerned for number of Meds for his mood  Past Medical History:  Diagnosis Date  . Acute urinary retention 01/28/2013  . AKI (acute kidney injury) ([place]) 01/26/2013  . Altered mental status 09/09/2013  . Anemia    B12 deficiency  . ANXIETY 06/07/2007   Qualifier: Diagnosis of  By: Talbert Cage CMA ([place]), June    . ARTHRITIS 06/07/2007   Qualifier: Diagnosis of  By: Talbert Cage CMA ([place]), June    . Balance problems 04/05/2011  . Benign essential tremor   . Benign fibroma of prostate 10/11/2011  . Bilateral inguinal hernia 10/09/2012  . Bilateral leg edema 09/13/2012  . Bladder retention 02/05/2013  . Bradycardia, sinus 08/02/2012  . CAROTID ARTERY DISEASE 03/31/2007   Qualifier: Diagnosis of  By: Linna Darner MD, Gwyndolyn Saxon    . Carotid stenosis    s/p L CEA  . Cataract, nuclear 03/21/2014  . 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 Cervical spinal stenosis   . Cervical stenosis of spine   . Critical lower limb ischemia   . Degeneration macular 11/02/2011  . Depression    Dr Albertine Patricia  . Difficulty in walking(719.7) 04/05/2011  .  ESOPHAGITIS 08/06/2002   Qualifier: Diagnosis of  By: Talbert Cage CMA ([place]), June    . Essential and other specified forms of tremor 04/18/2013  . Fall at home 01/06/2017   left hip pain  . Falls   . Family history of colon cancer 07/24/2012  . Fecal impaction ([place]) 10/11/2012  . Femoral hernia, bilateral s/p lap repair 01/16/2013 01/16/2013  . Glaucoma, compensated 03/21/2014  . H/O cardiovascular stress test    a. 02/2003 -> no ischemia/infarct.  Marland Kitchen  HAMMER TOE 04/23/2010   Qualifier: Diagnosis of  By: Linna Darner MD, Gwyndolyn Saxon    . Hematoma of leg   . Hip hematoma, right 09/09/2013  . History of shingles 2009  . HTN (hypertension) 08/22/2012  . Hx of echocardiogram    Echocardiogram 08/01/12: Mild LVH, EF 55-60%, normal wall motion.  . Hydrocele 10/11/2011  . Hyperlipidemia   . 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 Hypothyroidism   . Macular degeneration disease   . Macular edema   . Migraines    Dr Jannifer Franklin  . MORTON'S NEUROMA, LEFT 08/22/2007   Qualifier: Diagnosis of  By: Linna Darner MD, Gwyndolyn Saxon    . PAD (peripheral artery disease) ([place])   . Peripheral neuropathy   . Personal history of colonic polyps 01/05/2013   2014 2 mm adenomatous polyp   . PREMATURE ATRIAL CONTRACTIONS 03/14/2006   Qualifier: Diagnosis of  By: Linna Darner MD, Gwendolyn Lima 12/16/2011  . SPINAL STENOSIS 03/14/2006   Qualifier: Diagnosis of  By: Linna Darner MD, William   Cervical spine    . Testosterone deficiency    Dr Lawerance Bach, [facility]  . Unspecified vitamin D deficiency 08/06/2012  . Urge incontinence 10/09/2012  . Urinary tract infection, site not specified 02/13/2013  . Weakness 08/02/2012   Past Surgical History:  Procedure Laterality Date  . APPENDECTOMY  1941  . BREAST CYST EXCISION Left 01/16/2013   Procedure: MASS EXCISION AXILLA;  Surgeon: Adin Hector, MD;  Location: [facility];  Service: General;  Laterality: Left;  . CAROTID ENDARTERECTOMY  2005    L  . CATARACT EXTRACTION Right   . COLONOSCOPY  2014   Dr. Deatra Ina  . EYE SURGERY Right 02/2012   retina  . INGUINAL HERNIA REPAIR Bilateral 01/16/2013   Procedure: LAPAROSCOPIC BILATERAL INGUINAL DIRECT AND INDIRECT, FEMORAL, AND OBTURATOR HERNIAS;  Surgeon: Adin Hector, MD;  Location: [facility];  Service: General;  Laterality: Bilateral;  . INSERTION OF MESH N/A 01/16/2013   Procedure: INSERTION OF MESH;  Surgeon: Adin Hector, MD;  Location: [facility];  Service: General;  Laterality: N/A;  . LUMBAR LAMINECTOMY     . TONSILLECTOMY AND ADENOIDECTOMY    . TOTAL HIP ARTHROPLASTY  2005   L    Allergies  Allergen Reactions  . Latex Rash  . Tape Rash    Paper tape is ok to use     Allergies as of 12/11/2018      Reactions   Latex Rash   Tape Rash   Paper tape is ok to use      Medication List       Accurate as of December 11, 2018  1:40 PM. If you have any questions, ask your nurse or doctor.        acetaminophen 500 MG tablet Commonly known as: TYLENOL Take 1,000 mg by mouth 3 (three) times daily.   acetaminophen 325 MG tablet Commonly known as: TYLENOL Take 650 mg by mouth every 4 (four) hours as needed.  amLODipine 10 MG tablet Commonly known as: NORVASC Take 10 mg by mouth daily.   ARIPiprazole 2 MG tablet Commonly known as: Abilify Take 1 tablet (2 mg total) by mouth daily.   aspirin 81 MG tablet Take 81 mg by mouth every morning.   atorvastatin 20 MG tablet Commonly known as: LIPITOR Take 20 mg by mouth daily.   B-complex with vitamin C tablet Take 1 tablet by mouth daily.   bimatoprost 0.01 % Soln Commonly known as: LUMIGAN Place 1 drop into both eyes at bedtime.   buPROPion 300 MG 24 hr tablet Commonly known as: WELLBUTRIN XL Take 300 mg by mouth daily. per psychiatry recommendation   clonazePAM 0.5 MG tablet Commonly known as: KLONOPIN Take 1 tablet (0.5 mg total) by mouth at bedtime.   erythromycin ophthalmic ointment Place 1 application into both eyes at bedtime.   ICAPS PO Take 1 tablet by mouth daily.   ketoconazole 2 % cream Commonly known as: NIZORAL Apply 1 application topically 2 (two) times daily as needed for irritation.   levothyroxine 50 MCG tablet Commonly known as: SYNTHROID Take 50 mcg by mouth daily before breakfast. Take 1/2 tablet on Sunday mornings and 1 on other days.   linaclotide 145 MCG Caps capsule Commonly known as: LINZESS Take 1 capsule (145 mcg total) by mouth daily before breakfast.   loratadine 10 MG tablet  Commonly known as: CLARITIN Take 10 mg by mouth daily.   Melatonin 3 MG Tabs Take 1 tablet by mouth at bedtime.   mirtazapine 30 MG tablet Commonly known as: REMERON Take 60 mg by mouth at bedtime.   polyethylene glycol 17 g packet Commonly known as: MIRALAX / GLYCOLAX Take 17 g by mouth daily. Hold for loose stool   sertraline 100 MG tablet Commonly known as: ZOLOFT Take 200 mg by mouth every morning.   Systane 0.4-0.3 % Soln Generic drug: Polyethyl Glycol-Propyl Glycol Apply 1 drop to eye 4 (four) times daily.   tamsulosin 0.4 MG Caps capsule Commonly known as: FLOMAX TAKE 1 CAPSULE (0.4 MG TOTAL) BY MOUTH DAILY.   topiramate 25 MG tablet Commonly known as: TOPAMAX TAKE 1 TABLET BY MOUTH EVERY MORNING AND 2 TABLETS BY MOUTH EVERY EVENING   Vitamin D3 25 MCG (1000 UT) Caps Take 1,000 Units by mouth daily.       Review of Systems  Constitutional: Positive for activity change.  HENT: Negative.   Respiratory: Negative.   Cardiovascular: Positive for leg swelling.  Gastrointestinal: Negative.   Genitourinary: Negative.   Musculoskeletal: Positive for arthralgias and gait problem.  Skin: Negative.   Neurological: Positive for weakness.  Psychiatric/Behavioral: Positive for dysphoric mood and hallucinations.    Immunization History  Administered Date(s) Administered  . DTaP 11/05/2005  . Hepatitis B February 03, 1933  . Influenza Split 11/12/2011, 11/29/2013  . Influenza Whole 10/30/2007, 11/12/2008, 11/26/2009  . Influenza, High Dose Seasonal PF 12/31/2015, 11/24/2016  . Influenza,inj,quad, With Preservative 10/16/2016  . Influenza-Unspecified 11/28/2017  . PPD Test 06/23/2011  . Pneumococcal Conjugate-13 01/14/2016  . Pneumococcal Polysaccharide-23 02/16/1999  . Tdap 01/13/2017  . Varicella 11/09/2005  . Zoster 08/30/2006   Pertinent  Health Maintenance Due  Topic Date Due  . INFLUENZA VACCINE  09/16/2018  . PNA vac Low Risk Adult  Completed   Fall Risk   10/28/2017 10/27/2016 06/09/2016 02/03/2016 10/28/2015  Falls in the past year? Yes Yes Yes Yes Yes  Number falls in past yr: 1 2 or more 2 or more 2 or more  2 or more  Comment - - - 2 falls in last 2 months -  Injury with Fall? Yes Yes No Yes No  Comment - L hip - scraps and bruises -  Risk Factor Category  - - - - -  Risk for fall due to : - - - - Impaired balance/gait  Follow up - - - - -   Functional Status Survey:    Vitals:   12/11/18 1308  BP: 106/63  Pulse: 63  Resp: 20  Temp: (!) 97.5 F (36.4 C)  SpO2: 96%  Weight: 146 lb 14.4 oz (66.6 kg)  Height: 6' (1.829 m)   Body mass index is 19.92 kg/m. Physical Exam Vitals signs reviewed.  Constitutional:      Appearance: Normal appearance.  HENT:     Head: Normocephalic.     Nose: Nose normal.     Mouth/Throat:     Mouth: Mucous membranes are moist.     Pharynx: Oropharynx is clear.  Eyes:     Pupils: Pupils are equal, round, and reactive to light.  Neck:     Musculoskeletal: Neck supple.  Cardiovascular:     Rate and Rhythm: Normal rate and regular rhythm.     Pulses: Normal pulses.  Pulmonary:     Effort: Pulmonary effort is normal.     Breath sounds: Normal breath sounds. No wheezing or rales.  Abdominal:     General: Abdomen is flat. Bowel sounds are normal.     Palpations: Abdomen is soft.  Musculoskeletal:     Comments: Trace edema Bilateral . Left knee is not swollen red or Warm  Skin:    General: Skin is warm.  Neurological:     General: No focal deficit present.     Mental Status: He is alert.  Psychiatric:        Mood and Affect: Mood normal.        Thought Content: Thought content normal.     Labs reviewed: Recent Labs    03/16/18 03/23/18 04/07/18 09/11/18  NA 144 139 142 142  K 4.5 4.2 4.3 4.1  CL 111  --   --  112  CO2 27  --   --  23  BUN 38* 40* 34* 34*  CREATININE 1.4* 1.4* 1.2 1.3  CALCIUM 9.0  --   --  8.4   Recent Labs    03/16/18 03/23/18 04/07/18 09/11/18  AST 15 19 20 18    ALT 13 17 18 12   ALKPHOS 36 40 42 44  PROT 5.5  --   --  5.8  ALBUMIN 3.3  --   --  3.5   Recent Labs    04/07/18 09/11/18 11/24/18  WBC 5.5 4.7 4.1  HGB 14.2 14.0 13.1*  HCT 44 42 40*  PLT 159 145* 140*   Lab Results  Component Value Date   TSH 2.06 11/24/2018   Lab Results  Component Value Date   HGBA1C 5 01/26/2018   Lab Results  Component Value Date   CHOL 146 11/24/2018   HDL 38 11/24/2018   LDLCALC 89 11/24/2018   LDLDIRECT 132.5 09/08/2012   TRIG 91 11/24/2018   CHOLHDL 2.7 11/15/2016    Significant Diagnostic Results in last 30 days:  No results found.  Assessment/Plan  Acute pain of left knee Was Weight bearing with the therapy Will start him on Mobic 7.5 mg QD for 7 days If not better will Consider Xray  Bilateral leg edema With chronic changes.  NO recent change in weigght Will start with decreasing the dose of Noirvasc to 5 mg Continue to monitor BP Will also try Ted hoses  Major depression with Hallucinations On Number of Meds including Wellbutrin and Zoloft And now Abilify Refer to his psychiatrist for Reval  Other issues  Essential Hypertension BP stable on Norvasc Hypothyroidism TSH normal in 10/20 Chronic constipation On Linzess Chronic  Conjuctivits OnErythromycin ointmentper Ophthalmologist Hyperlipidemia Repeat LDL was 89 Continue Statin Would not increase the dose due to his mild weight loss BPH Symptoms Stable on Flomax Essential Tremors On Topamax         Family/ staff Communication:   Labs/tests ordered:

## 2019-01-01 ENCOUNTER — Encounter: Payer: Self-pay | Admitting: Internal Medicine

## 2019-01-01 ENCOUNTER — Non-Acute Institutional Stay (SKILLED_NURSING_FACILITY): Payer: Medicare Other | Admitting: Internal Medicine

## 2019-01-01 DIAGNOSIS — R600 Localized edema: Secondary | ICD-10-CM | POA: Diagnosis not present

## 2019-01-01 DIAGNOSIS — K5909 Other constipation: Secondary | ICD-10-CM

## 2019-01-01 DIAGNOSIS — F339 Major depressive disorder, recurrent, unspecified: Secondary | ICD-10-CM

## 2019-01-01 DIAGNOSIS — I10 Essential (primary) hypertension: Secondary | ICD-10-CM

## 2019-01-01 DIAGNOSIS — M25562 Pain in left knee: Secondary | ICD-10-CM | POA: Diagnosis not present

## 2019-01-01 DIAGNOSIS — R441 Visual hallucinations: Secondary | ICD-10-CM | POA: Diagnosis not present

## 2019-01-01 DIAGNOSIS — E039 Hypothyroidism, unspecified: Secondary | ICD-10-CM

## 2019-01-01 NOTE — Progress Notes (Signed)
Location:    [facility] Room Number: 37 Place of Service:  SNF [id number]) Provider: [name], MD  [name], MD  Patient Care Team: [name], MD as PCP - General (Internal Medicine) [name], MD as Consulting Physician (Urology) [name], MD as Consulting Physician (Neurology) [name], MD as Consulting Physician (Dermatology) [name], MD (Inactive) as Consulting Physician (Gastroenterology) [name], MD as Consulting Physician (Orthopedic Surgery) [name], MD as Consulting Physician (Cardiology) [name], MD as Consulting Physician (Ophthalmology) [name], MD as Referring Physician (Psychiatry) [name], MD as Consulting Physician (General Surgery) [name], MD as Consulting Physician (Orthopedic Surgery) [name], [name], NP as Nurse Practitioner (Family Medicine) Mast, Man X, NP as Nurse Practitioner (Internal Medicine)  Extended Emergency Contact Information Primary Emergency Contact: Chuang,[name] Address: [address]          [place], [place] [address] [place] Phone: [phone] Relation: Spouse  Code Status:  Full Code Goals of care: Advanced Directive information Advanced Directives 01/01/2019  Does Patient Have a Medical Advance Directive? Yes  Type of Advance Directive Living will;Healthcare Power of Attorney  Does patient want to make changes to medical advance directive? No - Patient declined  Copy of [place] in Chart? Yes - validated most recent copy scanned in chart (See row information)  Pre-existing out of facility DNR order (yellow form or pink MOST form) -     Chief Complaint  Patient presents with  . Medical Management of Chronic Issues  . Quality Metric Gaps    Influenza vaccine    HPI:  Pt is a 85 y.o. male seen today for medical management of chronic diseases.    Patient is long term resident  of facility Dependent for his ADLS  He has h/o HTN, Hyperlipidemia, PAD, BPH , CKD stage 2, Hypothyroidism, and Major Depression Also Has h/o Cervial Stenosis with Compressive Myelopathy, BPH His active comlains  Left Knee pain No swelling or tenderness. Working and Walking with Therapy Small Inguinal area Cyst Patient wants to see if refer to Dermatology Hallucinations  Controlled on Abilify Mood is stable also Weight is stable and Appetite is good  Past Medical History:  Diagnosis Date  . Acute urinary retention 01/28/2013  . AKI (acute kidney injury) ([place]) 01/26/2013  . Altered mental status 09/09/2013  . Anemia    B12 deficiency  . ANXIETY 06/07/2007   Qualifier: Diagnosis of  By: Talbert Cage CMA ([place]), June    . ARTHRITIS 06/07/2007   Qualifier: Diagnosis of  By: Talbert Cage CMA ([place]), June    . Balance problems 04/05/2011  . Benign essential tremor   . Benign fibroma of prostate 10/11/2011  . Bilateral inguinal hernia 10/09/2012  . Bilateral leg edema 09/13/2012  . Bladder retention 02/05/2013  . Bradycardia, sinus 08/02/2012  . CAROTID ARTERY DISEASE 03/31/2007   Qualifier: Diagnosis of  By: Linna Darner MD, Gwyndolyn Saxon    . Carotid stenosis    s/p L CEA  . Cataract, nuclear 03/21/2014  . 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 Cervical spinal stenosis   . Cervical stenosis of spine   . Critical lower limb ischemia   . Degeneration macular 11/02/2011  . Depression    Dr Albertine Patricia  . Difficulty in walking(719.7) 04/05/2011  . ESOPHAGITIS 08/06/2002   Qualifier: Diagnosis of  By: Talbert Cage CMA ([place]), June    .  Essential and other specified forms of tremor 04/18/2013  . Fall at home 01/06/2017   left hip pain  . Falls   . Family history of colon cancer 07/24/2012  . Fecal impaction ([place]) 10/11/2012  . Femoral hernia, bilateral s/p lap repair 01/16/2013 01/16/2013  . Glaucoma, compensated 03/21/2014  . H/O cardiovascular stress test    a. 02/2003 -> no ischemia/infarct.  Marland Kitchen HAMMER TOE 04/23/2010    Qualifier: Diagnosis of  By: Linna Darner MD, Gwyndolyn Saxon    . Hematoma of leg   . Hip hematoma, right 09/09/2013  . History of shingles 2009  . HTN (hypertension) 08/22/2012  . Hx of echocardiogram    Echocardiogram 08/01/12: Mild LVH, EF 55-60%, normal wall motion.  . Hydrocele 10/11/2011  . Hyperlipidemia   . 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 Hypothyroidism   . Macular degeneration disease   . Macular edema   . Migraines    Dr Jannifer Franklin  . MORTON'S NEUROMA, LEFT 08/22/2007   Qualifier: Diagnosis of  By: Linna Darner MD, Gwyndolyn Saxon    . PAD (peripheral artery disease) ([place])   . Peripheral neuropathy   . Personal history of colonic polyps 01/05/2013   2014 2 mm adenomatous polyp   . PREMATURE ATRIAL CONTRACTIONS 03/14/2006   Qualifier: Diagnosis of  By: Linna Darner MD, Gwendolyn Lima 12/16/2011  . SPINAL STENOSIS 03/14/2006   Qualifier: Diagnosis of  By: Linna Darner MD, William   Cervical spine    . Testosterone deficiency    Dr Lawerance Bach, [facility]  . Unspecified vitamin D deficiency 08/06/2012  . Urge incontinence 10/09/2012  . Urinary tract infection, site not specified 02/13/2013  . Weakness 08/02/2012   Past Surgical History:  Procedure Laterality Date  . APPENDECTOMY  1941  . BREAST CYST EXCISION Left 01/16/2013   Procedure: MASS EXCISION AXILLA;  Surgeon: Adin Hector, MD;  Location: [facility];  Service: General;  Laterality: Left;  . CAROTID ENDARTERECTOMY  2005    L  . CATARACT EXTRACTION Right   . COLONOSCOPY  2014   Dr. Deatra Ina  . EYE SURGERY Right 02/2012   retina  . INGUINAL HERNIA REPAIR Bilateral 01/16/2013   Procedure: LAPAROSCOPIC BILATERAL INGUINAL DIRECT AND INDIRECT, FEMORAL, AND OBTURATOR HERNIAS;  Surgeon: Adin Hector, MD;  Location: [facility];  Service: General;  Laterality: Bilateral;  . INSERTION OF MESH N/A 01/16/2013   Procedure: INSERTION OF MESH;  Surgeon: Adin Hector, MD;  Location: [facility];  Service: General;  Laterality: N/A;  . LUMBAR LAMINECTOMY    . TONSILLECTOMY AND  ADENOIDECTOMY    . TOTAL HIP ARTHROPLASTY  2005   L    Allergies  Allergen Reactions  . Latex Rash  . Tape Rash    Paper tape is ok to use     Allergies as of 01/01/2019      Reactions   Latex Rash   Tape Rash   Paper tape is ok to use      Medication List       Accurate as of January 01, 2019 11:46 AM. If you have any questions, ask your nurse or doctor.        acetaminophen 500 MG tablet Commonly known as: TYLENOL Take 1,000 mg by mouth 3 (three) times daily. What changed: Another medication with the same name was removed. Continue taking this medication, and follow the directions you see here. Changed by: [name], MD   amLODipine 5 MG tablet Commonly known as: NORVASC Take 7.5  mg by mouth daily.   ARIPiprazole 2 MG tablet Commonly known as: Abilify Take 1 tablet (2 mg total) by mouth daily.   aspirin 81 MG tablet Take 81 mg by mouth every morning.   atorvastatin 20 MG tablet Commonly known as: LIPITOR Take 20 mg by mouth daily.   B-complex with vitamin C tablet Take 1 tablet by mouth daily.   bimatoprost 0.01 % Soln Commonly known as: LUMIGAN Place 1 drop into both eyes at bedtime.   buPROPion 300 MG 24 hr tablet Commonly known as: WELLBUTRIN XL Take 300 mg by mouth daily. per psychiatry recommendation   clonazePAM 0.5 MG tablet Commonly known as: KLONOPIN Take 1 tablet (0.5 mg total) by mouth at bedtime.   erythromycin ophthalmic ointment Place 1 application into both eyes at bedtime.   ICAPS PO Take 1 tablet by mouth daily.   ketoconazole 2 % cream Commonly known as: NIZORAL Apply 1 application topically 2 (two) times daily as needed for irritation.   levothyroxine 50 MCG tablet Commonly known as: SYNTHROID Take 50 mcg by mouth daily before breakfast. Take 1/2 tablet on Sunday mornings and 1 on other days.   linaclotide 145 MCG Caps capsule Commonly known as: LINZESS Take 1 capsule (145 mcg total) by mouth daily before  breakfast.   loratadine 10 MG tablet Commonly known as: CLARITIN Take 10 mg by mouth daily.   Melatonin 3 MG Tabs Take 1 tablet by mouth at bedtime.   mirtazapine 30 MG tablet Commonly known as: REMERON Take 60 mg by mouth at bedtime.   polyethylene glycol 17 g packet Commonly known as: MIRALAX / GLYCOLAX Take 17 g by mouth daily. Hold for loose stool   sertraline 100 MG tablet Commonly known as: ZOLOFT Take 200 mg by mouth every morning.   Systane 0.4-0.3 % Soln Generic drug: Polyethyl Glycol-Propyl Glycol Apply 1 drop to eye 4 (four) times daily.   tamsulosin 0.4 MG Caps capsule Commonly known as: FLOMAX TAKE 1 CAPSULE (0.4 MG TOTAL) BY MOUTH DAILY.   topiramate 25 MG tablet Commonly known as: TOPAMAX TAKE 1 TABLET BY MOUTH EVERY MORNING AND 2 TABLETS BY MOUTH EVERY EVENING   Vitamin D3 25 MCG (1000 UT) Caps Take 1,000 Units by mouth daily.       Review of Systems  Constitutional: Negative.   HENT: Negative.   Cardiovascular: Negative.   Gastrointestinal: Negative.   Genitourinary: Negative.   Musculoskeletal: Positive for arthralgias.  Skin: Negative.   Neurological: Positive for weakness.  Psychiatric/Behavioral: Positive for confusion, dysphoric mood and hallucinations.    Immunization History  Administered Date(s) Administered  . DTaP 11/05/2005  . Hepatitis B Jan 05, 1933  . Influenza Split 11/12/2011, 11/29/2013  . Influenza Whole 10/30/2007, 11/12/2008, 11/26/2009  . Influenza, High Dose Seasonal PF 12/31/2015, 11/24/2016  . Influenza,inj,quad, With Preservative 10/16/2016  . Influenza-Unspecified 11/28/2017  . PPD Test 06/23/2011  . Pneumococcal Conjugate-13 01/14/2016  . Pneumococcal Polysaccharide-23 02/16/1999  . Tdap 01/13/2017  . Varicella 11/09/2005  . Zoster 08/30/2006   Pertinent  Health Maintenance Due  Topic Date Due  . INFLUENZA VACCINE  09/16/2018  . PNA vac Low Risk Adult  Completed   Fall Risk  10/28/2017 10/27/2016 06/09/2016  02/03/2016 10/28/2015  Falls in the past year? Yes Yes Yes Yes Yes  Number falls in past yr: 1 2 or more 2 or more 2 or more 2 or more  Comment - - - 2 falls in last 2 months -  Injury with Fall? Yes  Yes No Yes No  Comment - L hip - scraps and bruises -  Risk Factor Category  - - - - -  Risk for fall due to : - - - - Impaired balance/gait  Follow up - - - - -   Functional Status Survey:    Vitals:   01/01/19 1132  BP: 136/78  Pulse: 72  Resp: 20  Temp: 98.1 F (36.7 C)  SpO2: 93%  Weight: 145 lb 11.2 oz (66.1 kg)  Height: 6' (1.829 m)   Body mass index is 19.76 kg/m. Physical Exam Vitals signs reviewed.  Constitutional:      Appearance: Normal appearance.  HENT:     Head: Normocephalic.     Nose: Nose normal.     Mouth/Throat:     Mouth: Mucous membranes are moist.     Pharynx: Oropharynx is clear.  Eyes:     Pupils: Pupils are equal, round, and reactive to light.  Neck:     Musculoskeletal: Neck supple.  Cardiovascular:     Rate and Rhythm: Normal rate and regular rhythm.     Pulses: Normal pulses.  Pulmonary:     Effort: Pulmonary effort is normal.     Breath sounds: Normal breath sounds.  Abdominal:     General: Abdomen is flat. Bowel sounds are normal.     Palpations: Abdomen is soft.  Musculoskeletal:        General: Swelling present.     Comments: Left Knee is not Swollen or Red  Skin:    Comments: Patient has small cyst in Inguinal area but it bothersome to him Did not look Infected to me  Neurological:     General: No focal deficit present.     Mental Status: He is alert and oriented to person, place, and time.  Psychiatric:        Mood and Affect: Mood normal.        Thought Content: Thought content normal.     Labs reviewed: Recent Labs    03/16/18 03/23/18 04/07/18 09/11/18  NA 144 139 142 142  K 4.5 4.2 4.3 4.1  CL 111  --   --  112  CO2 27  --   --  23  BUN 38* 40* 34* 34*  CREATININE 1.4* 1.4* 1.2 1.3  CALCIUM 9.0  --   --  8.4    Recent Labs    03/16/18 03/23/18 04/07/18 09/11/18  AST 15 19 20 18   ALT 13 17 18 12   ALKPHOS 36 40 42 44  PROT 5.5  --   --  5.8  ALBUMIN 3.3  --   --  3.5   Recent Labs    04/07/18 09/11/18 11/24/18  WBC 5.5 4.7 4.1  HGB 14.2 14.0 13.1*  HCT 44 42 40*  PLT 159 145* 140*   Lab Results  Component Value Date   TSH 2.06 11/24/2018   Lab Results  Component Value Date   HGBA1C 5 01/26/2018   Lab Results  Component Value Date   CHOL 146 11/24/2018   HDL 38 11/24/2018   LDLCALC 89 11/24/2018   LDLDIRECT 132.5 09/08/2012   TRIG 91 11/24/2018   CHOLHDL 2.7 11/15/2016    Significant Diagnostic Results in last 30 days:  No results found.  Assessment/Plan  Left  Knee arthritis Continue Therapy and  Tylenol PRN Xray was negative for any acute process   Hallucinations, visual Is doing well in Abilify Inguinal Cyst Will get Dermatology to evaluate  Major depression, recurrent, chronic (HCC) On high doses of Wellbutrin and Zoloft and Remeron and Klonipin Per Education officer, museum his Psychiatrist does want to try to reduce his Zoloft for now Reduced the dose to 150 Reval in 2 weeks  Essential hypertension Continue Norvasc  Hypothyroidism TSH was 2.06 Chronic constipation OnLinzess  ChronicConjuctivits OnErythromycin ointmentper Ophthalmologist Hyperlipidemia LDL 89  BPH Symptoms Stable on Flomax  Essential Tremors On Topamax   Family/ staff Communication:   Labs/tests ordered:    Total time spent in this patient care encounter was  25_  minutes; greater than 50% of the visit spent counseling patient and staff, reviewing records , Labs and coordinating care for problems addressed at this encounter.

## 2019-01-05 ENCOUNTER — Other Ambulatory Visit: Payer: Self-pay | Admitting: *Deleted

## 2019-01-05 MED ORDER — CLONAZEPAM 0.5 MG PO TABS: 0.5000 mg | ORAL_TABLET | Freq: Every day | ORAL | 0 refills | Status: DC

## 2019-01-05 NOTE — Telephone Encounter (Signed)
[facility] Pended Rx and sent to Dr. Gupta for approval.  

## 2019-01-08 ENCOUNTER — Encounter: Payer: Self-pay | Admitting: Internal Medicine

## 2019-01-08 ENCOUNTER — Non-Acute Institutional Stay (SKILLED_NURSING_FACILITY): Payer: Medicare Other | Admitting: Internal Medicine

## 2019-01-08 DIAGNOSIS — F339 Major depressive disorder, recurrent, unspecified: Secondary | ICD-10-CM

## 2019-01-08 DIAGNOSIS — M25562 Pain in left knee: Secondary | ICD-10-CM | POA: Diagnosis not present

## 2019-01-08 NOTE — Progress Notes (Signed)
Location: [place] Room Number: 37 Place of Service:  SNF ([id number])  Provider:   Code Status:  Goals of Care:  Advanced Directives 01/01/2019  Does Patient Have a Medical Advance Directive? Yes  Type of Advance Directive Living will;Healthcare Power of Attorney  Does patient want to make changes to medical advance directive? No - Patient declined  Copy of [place] in Chart? Yes - validated most recent copy scanned in chart (See row information)  Pre-existing out of facility DNR order (yellow form or pink MOST form) -     Chief Complaint  Patient presents with  . Acute Visit    HPI: Patient is a 85 y.o. male seen today for an acute visit for C/o Leg Pain and Possible anxiety Patient is long term resident of facility Dependent for his ADLS  He has h/o HTN, Hyperlipidemia, PAD, BPH , CKD stage 2, Hypothyroidism, and Major Depression Also Has h/o Cervial Stenosis with Compressive Myelopathy, BPH  Recently we ad decreased his Zoloft to 150 mg due to being on high doses of Zoloft and Remeron and Klonopin and Abilify But this Fri Patient got very upset and made the Nurses call Ambulance to go to ED as he thought that his legs are not working right and possible were Broken. When the Ambulance came he refused to go and was fine over the weekend This morning her refused to take his meds . He says he is really worried about his wife who lives in [place] and has not come to visit her Patient told me today that his Knee pain is not bothering her but he is anxious about his wife and depressed. No Hallucinations  Past Medical History:  Diagnosis Date  . Acute urinary retention 01/28/2013  . AKI (acute kidney injury) ([place]) 01/26/2013  . Altered mental status 09/09/2013  . Anemia    B12 deficiency  . ANXIETY 06/07/2007   Qualifier: Diagnosis of  By: Talbert Cage CMA ([place]), June    . ARTHRITIS 06/07/2007   Qualifier: Diagnosis of  By: Talbert Cage CMA ([place]),  June    . Balance problems 04/05/2011  . Benign essential tremor   . Benign fibroma of prostate 10/11/2011  . Bilateral inguinal hernia 10/09/2012  . Bilateral leg edema 09/13/2012  . Bladder retention 02/05/2013  . Bradycardia, sinus 08/02/2012  . CAROTID ARTERY DISEASE 03/31/2007   Qualifier: Diagnosis of  By: Linna Darner MD, Gwyndolyn Saxon    . Carotid stenosis    s/p L CEA  . Cataract, nuclear 03/21/2014  . 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 Cervical spinal stenosis   . Cervical stenosis of spine   . Critical lower limb ischemia   . Degeneration macular 11/02/2011  . Depression    Dr Albertine Patricia  . Difficulty in walking(719.7) 04/05/2011  . ESOPHAGITIS 08/06/2002   Qualifier: Diagnosis of  By: Talbert Cage CMA ([place]), June    . Essential and other specified forms of tremor 04/18/2013  . Fall at home 01/06/2017   left hip pain  . Falls   . Family history of colon cancer 07/24/2012  . Fecal impaction ([place]) 10/11/2012  . Femoral hernia, bilateral s/p lap repair 01/16/2013 01/16/2013  . Glaucoma, compensated 03/21/2014  . H/O cardiovascular stress test    a. 02/2003 -> no ischemia/infarct.  Marland Kitchen HAMMER TOE 04/23/2010   Qualifier: Diagnosis of  By: Linna Darner MD, Gwyndolyn Saxon    . Hematoma of leg   . Hip hematoma, right 09/09/2013  . History of shingles 2009  .  HTN (hypertension) 08/22/2012  . Hx of echocardiogram    Echocardiogram 08/01/12: Mild LVH, EF 55-60%, normal wall motion.  . Hydrocele 10/11/2011  . Hyperlipidemia   . 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 Hypothyroidism   . Macular degeneration disease   . Macular edema   . Migraines    Dr Jannifer Franklin  . MORTON'S NEUROMA, LEFT 08/22/2007   Qualifier: Diagnosis of  By: Linna Darner MD, Gwyndolyn Saxon    . PAD (peripheral artery disease) ([place])   . Peripheral neuropathy   . Personal history of colonic polyps 01/05/2013   2014 2 mm adenomatous polyp   . PREMATURE ATRIAL CONTRACTIONS 03/14/2006   Qualifier: Diagnosis of  By: Linna Darner MD, Gwendolyn Lima 12/16/2011  . SPINAL  STENOSIS 03/14/2006   Qualifier: Diagnosis of  By: Linna Darner MD, William   Cervical spine    . Testosterone deficiency    Dr Lawerance [facility], [facility]  . Unspecified vitamin D deficiency 08/06/2012  . Urge incontinence 10/09/2012  . Urinary tract infection, site not specified 02/13/2013  . Weakness 08/02/2012    Past Surgical History:  Procedure Laterality Date  . APPENDECTOMY  1941  . BREAST CYST EXCISION Left 01/16/2013   Procedure: MASS EXCISION AXILLA;  Surgeon: Adin Hector, MD;  Location: [facility];  Service: General;  Laterality: Left;  . CAROTID ENDARTERECTOMY  2005    L  . CATARACT EXTRACTION Right   . COLONOSCOPY  2014   Dr. Deatra Ina  . EYE SURGERY Right 02/2012   retina  . INGUINAL HERNIA REPAIR Bilateral 01/16/2013   Procedure: LAPAROSCOPIC BILATERAL INGUINAL DIRECT AND INDIRECT, FEMORAL, AND OBTURATOR HERNIAS;  Surgeon: Adin Hector, MD;  Location: [facility];  Service: General;  Laterality: Bilateral;  . INSERTION OF MESH N/A 01/16/2013   Procedure: INSERTION OF MESH;  Surgeon: Adin Hector, MD;  Location: [facility];  Service: General;  Laterality: N/A;  . LUMBAR LAMINECTOMY    . TONSILLECTOMY AND ADENOIDECTOMY    . TOTAL HIP ARTHROPLASTY  2005   L    Allergies  Allergen Reactions  . Latex Rash  . Tape Rash    Paper tape is ok to use     Outpatient Encounter Medications as of 01/08/2019  Medication Sig  . acetaminophen (TYLENOL) 500 MG tablet Take 1,000 mg by mouth 3 (three) times daily.   Marland Kitchen amLODipine (NORVASC) 5 MG tablet Take 7.5 mg by mouth daily.   . ARIPiprazole (ABILIFY) 2 MG tablet Take 1 tablet (2 mg total) by mouth daily.  Marland Kitchen aspirin 81 MG tablet Take 81 mg by mouth every morning.   Marland Kitchen atorvastatin (LIPITOR) 20 MG tablet Take 20 mg by mouth daily.  . B Complex-C (B-COMPLEX WITH VITAMIN C) tablet Take 1 tablet by mouth daily.  . bimatoprost (LUMIGAN) 0.01 % SOLN Place 1 drop into both eyes at bedtime.  Marland Kitchen buPROPion (WELLBUTRIN XL) 300 MG 24 hr tablet Take 300 mg by mouth  daily. per psychiatry recommendation  . Cholecalciferol (VITAMIN D3) 1000 units CAPS Take 1,000 Units by mouth daily.   . clonazePAM (KLONOPIN) 0.5 MG tablet Take 1 tablet (0.5 mg total) by mouth at bedtime.  Marland Kitchen erythromycin ophthalmic ointment Place 1 application into both eyes at bedtime.   Marland Kitchen ketoconazole (NIZORAL) 2 % cream Apply 1 application topically 2 (two) times daily as needed for irritation.   Marland Kitchen levothyroxine (SYNTHROID, LEVOTHROID) 50 MCG tablet Take 50 mcg by mouth daily before breakfast. Take 1/2 tablet on Sunday mornings and 1  on other days.  Marland Kitchen linaclotide (LINZESS) 145 MCG CAPS capsule Take 1 capsule (145 mcg total) by mouth daily before breakfast.  . loratadine (CLARITIN) 10 MG tablet Take 10 mg by mouth daily.  . Melatonin 3 MG TABS Take 1 tablet by mouth at bedtime.  . meloxicam (MOBIC) 7.5 MG tablet Take 7.5 mg by mouth daily. As needed  . mirtazapine (REMERON) 30 MG tablet Take 60 mg by mouth at bedtime.  . Multiple Vitamins-Minerals (ICAPS PO) Take 1 tablet by mouth daily.  Vladimir Faster Glycol-Propyl Glycol (SYSTANE) 0.4-0.3 % SOLN Apply 1 drop to eye 4 (four) times daily.  . polyethylene glycol (MIRALAX / GLYCOLAX) packet Take 17 g by mouth daily. Hold for loose stool  . sertraline (ZOLOFT) 100 MG tablet Take 200 mg by mouth every morning.   . tamsulosin (FLOMAX) 0.4 MG CAPS capsule TAKE 1 CAPSULE (0.4 MG TOTAL) BY MOUTH DAILY.  Marland Kitchen topiramate (TOPAMAX) 25 MG tablet TAKE 1 TABLET BY MOUTH EVERY MORNING AND 2 TABLETS BY MOUTH EVERY EVENING   No facility-administered encounter medications on file as of 01/08/2019.     Review of Systems:  Review of Systems  Musculoskeletal: Positive for arthralgias and myalgias.  Neurological: Positive for weakness.  Psychiatric/Behavioral: Positive for behavioral problems, confusion and dysphoric mood.  All other systems reviewed and are negative.   Health Maintenance  Topic Date Due  . TETANUS/TDAP  01/14/2027  . INFLUENZA VACCINE   Completed  . PNA vac Low Risk Adult  Completed    Physical Exam: Vitals:   01/08/19 1449  BP: (!) 152/72  Pulse: 72  Resp: (!) 23  Temp: 97.7 F (36.5 C)  SpO2: 98%  Weight: 145 lb 11.2 oz (66.1 kg)  Height: 6' (1.829 m)   Body mass index is 19.76 kg/m. Physical Exam Vitals signs reviewed.  HENT:     Head: Normocephalic.     Nose: Nose normal.     Mouth/Throat:     Mouth: Mucous membranes are moist.     Pharynx: Oropharynx is clear.  Eyes:     Pupils: Pupils are equal, round, and reactive to light.  Neck:     Musculoskeletal: Neck supple.  Cardiovascular:     Rate and Rhythm: Normal rate and regular rhythm.     Pulses: Normal pulses.  Pulmonary:     Effort: Pulmonary effort is normal.     Breath sounds: Normal breath sounds.  Abdominal:     General: Abdomen is flat.     Palpations: Abdomen is soft.  Musculoskeletal:     Comments: No Pain Swelling ir redness of Left Knee  Skin:    General: Skin is warm.  Neurological:     General: No focal deficit present.     Mental Status: He is alert.  Psychiatric:     Comments: Patient is Anxious and Depressed     Labs reviewed: Basic Metabolic Panel: Recent Labs    03/16/18 03/23/18 04/07/18 09/11/18 11/24/18  NA 144 139 142 142  --   K 4.5 4.2 4.3 4.1  --   CL 111  --   --  112  --   CO2 27  --   --  23  --   BUN 38* 40* 34* 34*  --   CREATININE 1.4* 1.4* 1.2 1.3  --   CALCIUM 9.0  --   --  8.4  --   TSH  --   --  2.37 1.77 2.06   Liver Function  Tests: Recent Labs    03/16/18 03/23/18 04/07/18 09/11/18  AST 15 19 20 18   ALT 13 17 18 12   ALKPHOS 36 40 42 44  PROT 5.5  --   --  5.8  ALBUMIN 3.3  --   --  3.5   No results for input(s): LIPASE, AMYLASE in the last 8760 hours. No results for input(s): AMMONIA in the last 8760 hours. CBC: Recent Labs    04/07/18 09/11/18 11/24/18  WBC 5.5 4.7 4.1  HGB 14.2 14.0 13.1*  HCT 44 42 40*  PLT 159 145* 140*   Lipid Panel: Recent Labs    09/11/18 11/24/18   CHOL 151 146  HDL 39 38  LDLCALC 93 89  TRIG 99 91   Lab Results  Component Value Date   HGBA1C 5 01/26/2018    Procedures since last visit: No results found.  Assessment/Plan Acute pain of left knee His Knee exam was Benign Again today He was not having any pain today His xray before has been negative also  Major depression, Nurses are concerned that reduction in dose of Zoloft has caused him to become anxious Will Increase his dose to 200 mg QD again Continue all his other Meds  Hallucinations, visual Is doing well in Abilify  Other Issues Essential hypertension Continue Norvasc  Hypothyroidism TSH was 2.06 Chronic constipation OnLinzess  ChronicConjuctivits OnErythromycin ointmentper Ophthalmologist Hyperlipidemia LDL 89  BPH Symptoms Stable on Flomax  Essential Tremors On Topamax    Labs/tests ordered:  * No order type specified * Next appt:  Visit date not found Total time spent in this patient care encounter was  25_  minutes; greater than 50% of the visit spent counseling patient and staff, reviewing records , Labs and coordinating care for problems addressed at this encounter.

## 2019-01-10 ENCOUNTER — Non-Acute Institutional Stay (SKILLED_NURSING_FACILITY): Payer: Medicare Other | Admitting: Internal Medicine

## 2019-01-10 ENCOUNTER — Encounter: Payer: Self-pay | Admitting: Internal Medicine

## 2019-01-10 DIAGNOSIS — L89152 Pressure ulcer of sacral region, stage 2: Secondary | ICD-10-CM

## 2019-01-10 NOTE — Progress Notes (Signed)
Location: [facility] Room Number: 37 Place of Service:  SNF (31)  Provider:   Code Status: Full Code Goals of Care:  Advanced Directives 01/01/2019  Does Patient Have a Medical Advance Directive? Yes  Type of Advance Directive Living will;Healthcare Power of Attorney  Does patient want to make changes to medical advance directive? No - Patient declined  Copy of [place] in Chart? Yes - validated most recent copy scanned in chart (See row information)  Pre-existing out of facility DNR order (yellow form or pink MOST form) -     Chief Complaint  Patient presents with  . Acute Visit    sacral wound    HPI: Patient is a 85 y.o. male seen today for an acute visit for Sacral Pressure Ulcer  Patient is long term resident of facility Dependent forhis ADLS  He has h/o HTN, Hyperlipidemia, PAD, BPH , CKD stage 2, Hypothyroidism, and Major Depression Also Has h/o Cervial Stenosis with Compressive Myelopathy, BPH   Patient was c/o Pain in his Bottom and Nurses called me to evaluate him and he has open pressure wound in his sacral area. Including near the tail bone and Perianal area  Past Medical History:  Diagnosis Date  . Acute urinary retention 01/28/2013  . AKI (acute kidney injury) ([place]) 01/26/2013  . Altered mental status 09/09/2013  . Anemia    B12 deficiency  . ANXIETY 06/07/2007   Qualifier: Diagnosis of  By: Talbert Cage CMA ([place]), June    . ARTHRITIS 06/07/2007   Qualifier: Diagnosis of  By: Talbert Cage CMA ([place]), June    . Balance problems 04/05/2011  . Benign essential tremor   . Benign fibroma of prostate 10/11/2011  . Bilateral inguinal hernia 10/09/2012  . Bilateral leg edema 09/13/2012  . Bladder retention 02/05/2013  . Bradycardia, sinus 08/02/2012  . CAROTID ARTERY DISEASE 03/31/2007   Qualifier: Diagnosis of  By: Linna Darner MD, Gwyndolyn Saxon    . Carotid stenosis    s/p L CEA  . Cataract, nuclear 03/21/2014  . Cellophane retinopathy  04/27/2011  . Cervical spinal stenosis   . Cervical stenosis of spine   . Critical lower limb ischemia   . Degeneration macular 11/02/2011  . Depression    Dr Albertine Patricia  . Difficulty in walking(719.7) 04/05/2011  . ESOPHAGITIS 08/06/2002   Qualifier: Diagnosis of  By: Talbert Cage CMA ([employer]), June    . Essential and other specified forms of tremor 04/18/2013  . Fall at home 01/06/2017   left hip pain  . Falls   . Family history of colon cancer 07/24/2012  . Fecal impaction ([place]) 10/11/2012  . Femoral hernia, bilateral s/p lap repair 01/16/2013 01/16/2013  . Glaucoma, compensated 03/21/2014  . H/O cardiovascular stress test    a. 02/2003 -> no ischemia/infarct.  Marland Kitchen HAMMER TOE 04/23/2010   Qualifier: Diagnosis of  By: Linna Darner MD, Gwyndolyn Saxon    . Hematoma of leg   . Hip hematoma, right 09/09/2013  . History of shingles 2009  . HTN (hypertension) 08/22/2012  . Hx of echocardiogram    Echocardiogram 08/01/12: Mild LVH, EF 55-60%, normal wall motion.  . Hydrocele 10/11/2011  . Hyperlipidemia   . 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 Hypothyroidism   . Macular degeneration disease   . Macular edema   . Migraines    Dr Jannifer Franklin  . MORTON'S NEUROMA, LEFT 08/22/2007   Qualifier: Diagnosis of  By: Linna Darner MD, Gwyndolyn Saxon    . PAD (peripheral artery disease) ([place])   .  Peripheral neuropathy   . Personal history of colonic polyps 01/05/2013   2014 2 mm adenomatous polyp   . PREMATURE ATRIAL CONTRACTIONS 03/14/2006   Qualifier: Diagnosis of  By: Linna Darner MD, Gwendolyn Lima 12/16/2011  . SPINAL STENOSIS 03/14/2006   Qualifier: Diagnosis of  By: Linna Darner MD, [place]   Cervical spine    . Testosterone deficiency    Dr Lawerance Bach, [facility]  . Unspecified vitamin D deficiency 08/06/2012  . Urge incontinence 10/09/2012  . Urinary tract infection, site not specified 02/13/2013  . Weakness 08/02/2012    Past Surgical History:  Procedure Laterality Date  . APPENDECTOMY  1941  . BREAST CYST EXCISION Left 01/16/2013    Procedure: MASS EXCISION AXILLA;  Surgeon: Adin Hector, MD;  Location: [facility];  Service: General;  Laterality: Left;  . CAROTID ENDARTERECTOMY  2005    L  . CATARACT EXTRACTION Right   . COLONOSCOPY  2014   Dr. Deatra Ina  . EYE SURGERY Right 02/2012   retina  . INGUINAL HERNIA REPAIR Bilateral 01/16/2013   Procedure: LAPAROSCOPIC BILATERAL INGUINAL DIRECT AND INDIRECT, FEMORAL, AND OBTURATOR HERNIAS;  Surgeon: Adin Hector, MD;  Location: [facility];  Service: General;  Laterality: Bilateral;  . INSERTION OF MESH N/A 01/16/2013   Procedure: INSERTION OF MESH;  Surgeon: Adin Hector, MD;  Location: [facility];  Service: General;  Laterality: N/A;  . LUMBAR LAMINECTOMY    . TONSILLECTOMY AND ADENOIDECTOMY    . TOTAL HIP ARTHROPLASTY  2005   L    Allergies  Allergen Reactions  . Latex Rash  . Tape Rash    Paper tape is ok to use     Outpatient Encounter Medications as of 01/10/2019  Medication Sig  . acetaminophen (TYLENOL) 500 MG tablet Take 1,000 mg by mouth 3 (three) times daily.   Marland Kitchen amLODipine (NORVASC) 5 MG tablet Take 7.5 mg by mouth daily.   . ARIPiprazole (ABILIFY) 2 MG tablet Take 1 tablet (2 mg total) by mouth daily.  Marland Kitchen aspirin 81 MG tablet Take 81 mg by mouth every morning.   Marland Kitchen atorvastatin (LIPITOR) 20 MG tablet Take 20 mg by mouth daily.  . B Complex-C (B-COMPLEX WITH VITAMIN C) tablet Take 1 tablet by mouth daily.  . bimatoprost (LUMIGAN) 0.01 % SOLN Place 1 drop into both eyes at bedtime.  Marland Kitchen buPROPion (WELLBUTRIN XL) 300 MG 24 hr tablet Take 300 mg by mouth daily. per psychiatry recommendation  . Cholecalciferol (VITAMIN D3) 1000 units CAPS Take 1,000 Units by mouth daily.   . clonazePAM (KLONOPIN) 0.5 MG tablet Take 1 tablet (0.5 mg total) by mouth at bedtime.  Marland Kitchen erythromycin ophthalmic ointment Place 1 application into both eyes at bedtime.   Marland Kitchen ketoconazole (NIZORAL) 2 % cream Apply 1 application topically 2 (two) times daily as needed for irritation.   Marland Kitchen levothyroxine  (SYNTHROID, LEVOTHROID) 50 MCG tablet Take 50 mcg by mouth daily before breakfast. Take 1/2 tablet on Sunday mornings and 1 on other days.  Marland Kitchen linaclotide (LINZESS) 145 MCG CAPS capsule Take 1 capsule (145 mcg total) by mouth daily before breakfast.  . loratadine (CLARITIN) 10 MG tablet Take 10 mg by mouth daily.  . Melatonin 3 MG TABS Take 1 tablet by mouth at bedtime.  . meloxicam (MOBIC) 7.5 MG tablet Take 7.5 mg by mouth daily. As needed  . mirtazapine (REMERON) 30 MG tablet Take 60 mg by mouth at bedtime.  . Multiple Vitamins-Minerals (ICAPS PO) Take  1 tablet by mouth daily.  Vladimir Faster Glycol-Propyl Glycol (SYSTANE) 0.4-0.3 % SOLN Apply 1 drop to eye 4 (four) times daily.  . polyethylene glycol (MIRALAX / GLYCOLAX) packet Take 17 g by mouth daily. Hold for loose stool  . sertraline (ZOLOFT) 100 MG tablet Take 200 mg by mouth every morning.   . tamsulosin (FLOMAX) 0.4 MG CAPS capsule TAKE 1 CAPSULE (0.4 MG TOTAL) BY MOUTH DAILY.  Marland Kitchen topiramate (TOPAMAX) 25 MG tablet TAKE 1 TABLET BY MOUTH EVERY MORNING AND 2 TABLETS BY MOUTH EVERY EVENING   No facility-administered encounter medications on file as of 01/10/2019.     Review of Systems:  Review of Systems  Constitutional: Positive for activity change.  Musculoskeletal: Positive for arthralgias, back pain and myalgias.  Skin: Positive for wound.  Neurological: Positive for weakness.  Psychiatric/Behavioral: Positive for confusion and dysphoric mood.  All other systems reviewed and are negative.   Health Maintenance  Topic Date Due  . TETANUS/TDAP  01/14/2027  . INFLUENZA VACCINE  Completed  . PNA vac Low Risk Adult  Completed    Physical Exam: Vitals:   01/10/19 1642  BP: (!) 105/56  Pulse: 67  Resp: 20  Temp: 97.6 F (36.4 C)  SpO2: 97%  Weight: 145 lb 11.2 oz (66.1 kg)  Height: 6' (1.829 m)   Body mass index is 19.76 kg/m. Physical Exam Vitals signs reviewed.  HENT:     Head: Normocephalic.     Nose: Nose normal.      Mouth/Throat:     Mouth: Mucous membranes are moist.     Pharynx: Oropharynx is clear.  Eyes:     Pupils: Pupils are equal, round, and reactive to light.  Neck:     Musculoskeletal: Neck supple.  Cardiovascular:     Rate and Rhythm: Normal rate.     Pulses: Normal pulses.     Heart sounds: Normal heart sounds.  Pulmonary:     Effort: Pulmonary effort is normal.     Breath sounds: Normal breath sounds.  Abdominal:     General: Abdomen is flat. Bowel sounds are normal.     Palpations: Abdomen is soft.  Musculoskeletal:        General: No swelling.  Skin:    Comments: Has 2 open Wounds in Sacrum one near the Lower Vertebra and other in Perianal area  Neurological:     General: No focal deficit present.     Mental Status: He is alert.  Psychiatric:        Mood and Affect: Mood normal.        Thought Content: Thought content normal.        Judgment: Judgment normal.     Labs reviewed: Basic Metabolic Panel: Recent Labs    03/16/18 03/23/18 04/07/18 09/11/18 11/24/18  NA 144 139 142 142  --   K 4.5 4.2 4.3 4.1  --   CL 111  --   --  112  --   CO2 27  --   --  23  --   BUN 38* 40* 34* 34*  --   CREATININE 1.4* 1.4* 1.2 1.3  --   CALCIUM 9.0  --   --  8.4  --   TSH  --   --  2.37 1.77 2.06   Liver Function Tests: Recent Labs    03/16/18 03/23/18 04/07/18 09/11/18  AST 15 19 20 18   ALT 13 17 18 12   ALKPHOS 36 40 42 44  PROT  5.5  --   --  5.8  ALBUMIN 3.3  --   --  3.5   No results for input(s): LIPASE, AMYLASE in the last 8760 hours. No results for input(s): AMMONIA in the last 8760 hours. CBC: Recent Labs    04/07/18 09/11/18 11/24/18  WBC 5.5 4.7 4.1  HGB 14.2 14.0 13.1*  HCT 44 42 40*  PLT 159 145* 140*   Lipid Panel: Recent Labs    09/11/18 11/24/18  CHOL 151 146  HDL 39 38  LDLCALC 93 89  TRIG 99 91   Lab Results  Component Value Date   HGBA1C 5 01/26/2018    Procedures since last visit: No results found.  Assessment/Plan 1. Pressure  ulcer of sacral region, stage 2 ([place]) D/w the Nurse Arlyss Repress Will start with Allevyn in that area QD. Also use Zinc around for barrier Avoid Sitting in his wheelchair for long time  Other Issues Acute pain of left knee He was not having any pain today His xray before has been negative also  Major depression, Nurses are concerned that reduction in dose of Zoloft has caused him to become anxious  Increased his dose to 200 mg QD again Continue all his other Meds  Hallucinations, visual Is doing well in Abilify   Essential hypertension Continue Norvasc  Hypothyroidism TSH was 2.06 Chronic constipation OnLinzess  ChronicConjuctivits OnErythromycin ointmentper Ophthalmologist Hyperlipidemia LDL 89  BPH Symptoms Stable on Flomax  Essential Tremors On Topamax    Labs/tests ordered:  * No order type specified * Next appt:  Visit date not found

## 2019-01-23 ENCOUNTER — Non-Acute Institutional Stay (SKILLED_NURSING_FACILITY): Payer: Medicare Other | Admitting: Nurse Practitioner

## 2019-01-23 ENCOUNTER — Encounter: Payer: Self-pay | Admitting: Nurse Practitioner

## 2019-01-23 DIAGNOSIS — G250 Essential tremor: Secondary | ICD-10-CM

## 2019-01-23 DIAGNOSIS — I10 Essential (primary) hypertension: Secondary | ICD-10-CM

## 2019-01-23 DIAGNOSIS — F333 Major depressive disorder, recurrent, severe with psychotic symptoms: Secondary | ICD-10-CM

## 2019-01-23 DIAGNOSIS — M159 Polyosteoarthritis, unspecified: Secondary | ICD-10-CM | POA: Diagnosis not present

## 2019-01-23 DIAGNOSIS — N138 Other obstructive and reflux uropathy: Secondary | ICD-10-CM

## 2019-01-23 DIAGNOSIS — K5909 Other constipation: Secondary | ICD-10-CM

## 2019-01-23 DIAGNOSIS — N401 Enlarged prostate with lower urinary tract symptoms: Secondary | ICD-10-CM

## 2019-01-23 DIAGNOSIS — E039 Hypothyroidism, unspecified: Secondary | ICD-10-CM

## 2019-01-23 NOTE — Progress Notes (Signed)
Location:   [place] Room Number: 37 Place of Service:  SNF ([phone]) Provider:  Sussie Minor NP  [name], MD  Patient Care Team: [name], MD as PCP - General (Internal Medicine) [name], MD as Consulting Physician (Urology) [name], MD as Consulting Physician (Neurology) [name], MD as Consulting Physician (Dermatology) [name], MD (Inactive) as Consulting Physician (Gastroenterology) [name], MD as Consulting Physician (Orthopedic Surgery) [name], MD as Consulting Physician (Cardiology) [name], MD as Consulting Physician (Ophthalmology) [name], MD as Referring Physician (Psychiatry) [name], MD as Consulting Physician (General Surgery) [name], MD as Consulting Physician (Orthopedic Surgery) [name], [name], NP as Nurse Practitioner (Family Medicine) Keegan Bensch X, NP as Nurse Practitioner (Internal Medicine)  Extended Emergency Contact Information Primary Emergency Contact: Barrette,[name] Address: [address]          [place], [place] [address] [place] Phone: [phone] Relation: Spouse  Code Status:  Full Code Goals of care: Advanced Directive information Advanced Directives 01/23/2019  Does Patient Have a Medical Advance Directive? Yes  Type of Advance Directive Living will;Healthcare Power of Attorney  Does patient want to make changes to medical advance directive? No - Patient declined  Copy of [place] in Chart? Yes - validated most recent copy scanned in chart (See row information)  Pre-existing out of facility DNR order (yellow form or pink MOST form) -     Chief Complaint  Patient presents with  . Medical Management of Chronic Issues    HPI:  Pt is a 85 y.o. male seen today for medical management of chronic diseases.    The patient resides in [facility] for safety, care assistance, w/c  for mobility. Hx of hypothyroidism, taking Levothyroxine 39mcg qd, TSH 2.06 11/24/18. BPH, no urinary retention, on Tamsulosin 0.4mg  qd. HTN, blood pressure is controlled, on Amlodipine 7.5mg  qd. Multiple sites osteoarthritis pain, managed, on Tylenol 1000mg  tid, Meloxicam 7.5mg  qd. Depression, stable, not c/o visual hallucination, on Sertraline 200mg  qd,  Abilify 2mg  qd, Wllburrin 300mg  qd, Clonazepam 0.5mg  qhs, Mirtazapine 60mg  qd. Constipation, stable, on Linzess 122mcg qd, MiraLax qd. Tremor,  stable on Topamax 25mg  qd, I qd, II qd.    Past Medical History:  Diagnosis Date  . Acute urinary retention 01/28/2013  . AKI (acute kidney injury) ([place]) 01/26/2013  . Altered mental status 09/09/2013  . Anemia    B12 deficiency  . ANXIETY 06/07/2007   Qualifier: Diagnosis of  By: Talbert Cage CMA ([place]), June    . ARTHRITIS 06/07/2007   Qualifier: Diagnosis of  By: Talbert Cage CMA ([place]), June    . Balance problems 04/05/2011  . Benign essential tremor   . Benign fibroma of prostate 10/11/2011  . Bilateral inguinal hernia 10/09/2012  . Bilateral leg edema 09/13/2012  . Bladder retention 02/05/2013  . Bradycardia, sinus 08/02/2012  . CAROTID ARTERY DISEASE 03/31/2007   Qualifier: Diagnosis of  By: Linna Darner MD, Gwyndolyn Saxon    . Carotid stenosis    s/p L CEA  . Cataract, nuclear 03/21/2014  . 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 Cervical spinal stenosis   . Cervical stenosis of spine   . Critical lower limb ischemia   . Degeneration macular 11/02/2011  . Depression    Dr Albertine Patricia  . Difficulty in walking(719.7) 04/05/2011  . ESOPHAGITIS 08/06/2002   Qualifier: Diagnosis of  By: Talbert Cage CMA Deborra Medina), June    . Essential and other specified forms of tremor 04/18/2013  . Fall at home 01/06/2017   left hip pain  . Falls   . Family history of colon cancer 07/24/2012  . Fecal impaction ([place]) 10/11/2012  . Femoral hernia, bilateral s/p lap repair 01/16/2013 01/16/2013  . Glaucoma, compensated 03/21/2014  . H/O cardiovascular  stress test    a. 02/2003 -> no ischemia/infarct.  Marland Kitchen HAMMER TOE 04/23/2010   Qualifier: Diagnosis of  By: Linna Darner MD, Gwyndolyn Saxon    . Hematoma of leg   . Hip hematoma, right 09/09/2013  . History of shingles 2009  . HTN (hypertension) 08/22/2012  . Hx of echocardiogram    Echocardiogram 08/01/12: Mild LVH, EF 55-60%, normal wall motion.  . Hydrocele 10/11/2011  . Hyperlipidemia   . 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 Hypothyroidism   . Macular degeneration disease   . Macular edema   . Migraines    Dr Jannifer Franklin  . MORTON'S NEUROMA, LEFT 08/22/2007   Qualifier: Diagnosis of  By: Linna Darner MD, Gwyndolyn Saxon    . PAD (peripheral artery disease) ([place])   . Peripheral neuropathy   . Personal history of colonic polyps 01/05/2013   2014 2 mm adenomatous polyp   . PREMATURE ATRIAL CONTRACTIONS 03/14/2006   Qualifier: Diagnosis of  By: Linna Darner MD, Gwendolyn Lima 12/16/2011  . SPINAL STENOSIS 03/14/2006   Qualifier: Diagnosis of  By: Linna Darner MD, William   Cervical spine    . Testosterone deficiency    Dr Lawerance Bach, [facility]  . Unspecified vitamin D deficiency 08/06/2012  . Urge incontinence 10/09/2012  . Urinary tract infection, site not specified 02/13/2013  . Weakness 08/02/2012   Past Surgical History:  Procedure Laterality Date  . APPENDECTOMY  1941  . BREAST CYST EXCISION Left 01/16/2013   Procedure: MASS EXCISION AXILLA;  Surgeon: Adin Hector, MD;  Location: [place];  Service: General;  Laterality: Left;  . CAROTID ENDARTERECTOMY  2005    L  . CATARACT EXTRACTION Right   . COLONOSCOPY  2014   Dr. Deatra Ina  . EYE SURGERY Right 02/2012   retina  . INGUINAL HERNIA REPAIR Bilateral 01/16/2013   Procedure: LAPAROSCOPIC BILATERAL INGUINAL DIRECT AND INDIRECT, FEMORAL, AND OBTURATOR HERNIAS;  Surgeon: Adin Hector, MD;  Location: [facility];  Service: General;  Laterality: Bilateral;  . INSERTION OF MESH N/A 01/16/2013   Procedure: INSERTION OF MESH;  Surgeon: Adin Hector, MD;  Location: [facility];   Service: General;  Laterality: N/A;  . LUMBAR LAMINECTOMY    . TONSILLECTOMY AND ADENOIDECTOMY    . TOTAL HIP ARTHROPLASTY  2005   L    Allergies  Allergen Reactions  . Latex Rash  . Tape Rash    Paper tape is ok to use     Allergies as of 01/23/2019      Reactions   Latex Rash   Tape Rash   Paper tape is ok to use      Medication List       Accurate as of January 23, 2019  3:49 PM. If you have any questions, ask your nurse or doctor.        acetaminophen 500 MG tablet Commonly known as: TYLENOL Take 1,000 mg by mouth 3 (three) times daily.   amLODipine 5 MG tablet Commonly known as: NORVASC Take 7.5 mg by mouth daily.   ARIPiprazole 2 MG tablet Commonly known as: Abilify Take 1 tablet (  2 mg total) by mouth daily.   aspirin 81 MG tablet Take 81 mg by mouth every morning.   atorvastatin 20 MG tablet Commonly known as: LIPITOR Take 20 mg by mouth daily.   B-complex with vitamin C tablet Take 1 tablet by mouth daily.   bimatoprost 0.01 % Soln Commonly known as: LUMIGAN Place 1 drop into both eyes at bedtime.   buPROPion 300 MG 24 hr tablet Commonly known as: WELLBUTRIN XL Take 300 mg by mouth daily. per psychiatry recommendation   clonazePAM 0.5 MG tablet Commonly known as: KLONOPIN Take 1 tablet (0.5 mg total) by mouth at bedtime.   erythromycin ophthalmic ointment Place 1 application into both eyes at bedtime.   ICAPS PO Take 1 tablet by mouth daily.   ketoconazole 2 % cream Commonly known as: NIZORAL Apply 1 application topically 2 (two) times daily as needed for irritation.   levothyroxine 50 MCG tablet Commonly known as: SYNTHROID Take 50 mcg by mouth daily before breakfast. Take 1/2 tablet on Sunday mornings and 1 on other days.   linaclotide 145 MCG Caps capsule Commonly known as: LINZESS Take 1 capsule (145 mcg total) by mouth daily before breakfast.   loratadine 10 MG tablet Commonly known as: CLARITIN Take 10 mg by mouth daily.    Melatonin 3 MG Tabs Take 1 tablet by mouth at bedtime.   meloxicam 7.5 MG tablet Commonly known as: MOBIC Take 7.5 mg by mouth daily. As needed   mirtazapine 30 MG tablet Commonly known as: REMERON Take 60 mg by mouth at bedtime.   polyethylene glycol 17 g packet Commonly known as: MIRALAX / GLYCOLAX Take 17 g by mouth daily. Hold for loose stool   sertraline 100 MG tablet Commonly known as: ZOLOFT Take 200 mg by mouth every morning.   Systane 0.4-0.3 % Soln Generic drug: Polyethyl Glycol-Propyl Glycol Apply 1 drop to eye 4 (four) times daily.   tamsulosin 0.4 MG Caps capsule Commonly known as: FLOMAX TAKE 1 CAPSULE (0.4 MG TOTAL) BY MOUTH DAILY.   topiramate 25 MG tablet Commonly known as: TOPAMAX TAKE 1 TABLET BY MOUTH EVERY MORNING AND 2 TABLETS BY MOUTH EVERY EVENING   Vitamin D3 25 MCG (1000 UT) Caps Take 1,000 Units by mouth daily.      ROS was provided with assistance of staff.  Review of Systems  Constitutional: Negative for activity change, appetite change, chills, diaphoresis, fatigue, fever and unexpected weight change.  HENT: Positive for hearing loss. Negative for congestion and voice change.   Eyes: Positive for visual disturbance.  Respiratory: Negative for cough, shortness of breath and wheezing.   Cardiovascular: Negative for chest pain, palpitations and leg swelling.  Gastrointestinal: Negative for abdominal distention, abdominal pain, constipation, diarrhea, nausea and vomiting.  Genitourinary: Negative for difficulty urinating, dysuria and urgency.  Musculoskeletal: Positive for arthralgias and gait problem.  Skin: Negative for color change and pallor.       Sacral wound.   Neurological: Positive for tremors. Negative for dizziness, speech difficulty, weakness and headaches.       Memory lapses.   Psychiatric/Behavioral: Positive for dysphoric mood. Negative for agitation, behavioral problems, hallucinations and sleep disturbance. The patient  is not nervous/anxious.     Immunization History  Administered Date(s) Administered  . DTaP 11/05/2005  . Hepatitis B October 30, 1932  . Influenza Split 11/12/2011, 11/29/2013  . Influenza Whole 10/30/2007, 11/12/2008, 11/26/2009  . Influenza, High Dose Seasonal PF 12/31/2015, 11/24/2016, 11/30/2018  . Influenza,inj,quad, With Preservative 10/16/2016  .  Influenza-Unspecified 11/28/2017  . PPD Test 06/23/2011  . Pneumococcal Conjugate-13 01/14/2016  . Pneumococcal Polysaccharide-23 02/16/1999  . Tdap 01/13/2017  . Varicella 11/09/2005  . Zoster 08/30/2006   Pertinent  Health Maintenance Due  Topic Date Due  . INFLUENZA VACCINE  Completed  . PNA vac Low Risk Adult  Completed   Fall Risk  10/28/2017 10/27/2016 06/09/2016 02/03/2016 10/28/2015  Falls in the past year? Yes Yes Yes Yes Yes  Number falls in past yr: 1 2 or more 2 or more 2 or more 2 or more  Comment - - - 2 falls in last 2 months -  Injury with Fall? Yes Yes No Yes No  Comment - L hip - scraps and bruises -  Risk Factor Category  - - - - -  Risk for fall due to : - - - - Impaired balance/gait  Follow up - - - - -   Functional Status Survey:    Vitals:   01/23/19 0955  BP: (!) 141/77  Pulse: 66  Resp: 20  Temp: (!) 97.4 F (36.3 C)  SpO2: 95%  Weight: 142 lb 12.8 oz (64.8 kg)  Height: 6' (1.829 m)   Body mass index is 19.37 kg/m. Physical Exam Vitals signs and nursing note reviewed.  Constitutional:      General: He is not in acute distress.    Appearance: Normal appearance. He is not ill-appearing, toxic-appearing or diaphoretic.  HENT:     Head: Normocephalic and atraumatic.     Nose: Nose normal.     Mouth/Throat:     Mouth: Mucous membranes are moist.  Eyes:     Extraocular Movements: Extraocular movements intact.     Conjunctiva/sclera: Conjunctivae normal.     Pupils: Pupils are equal, round, and reactive to light.     Comments: Left eye blind.   Neck:     Musculoskeletal: Normal range of motion  and neck supple.  Cardiovascular:     Rate and Rhythm: Normal rate and regular rhythm.     Heart sounds: No murmur.  Pulmonary:     Breath sounds: No wheezing or rales.  Chest:     Chest wall: No tenderness.  Abdominal:     General: Bowel sounds are normal. There is no distension.     Palpations: Abdomen is soft.     Tenderness: There is no abdominal tenderness. There is no right CVA tenderness, left CVA tenderness, guarding or rebound.  Musculoskeletal:     Right lower leg: No edema.     Left lower leg: No edema.  Skin:    General: Skin is warm and dry.     Comments: Sacral wound is covered in dressing   Neurological:     General: No focal deficit present.     Mental Status: He is alert. Mental status is at baseline.     Motor: No weakness.     Coordination: Coordination normal.     Gait: Gait abnormal.     Comments: Oriented to person, place.   Psychiatric:        Mood and Affect: Mood normal.        Behavior: Behavior normal.     Labs reviewed: Recent Labs    03/16/18 03/23/18 04/07/18 09/11/18  NA 144 139 142 142  K 4.5 4.2 4.3 4.1  CL 111  --   --  112  CO2 27  --   --  23  BUN 38* 40* 34* 34*  CREATININE 1.4*  1.4* 1.2 1.3  CALCIUM 9.0  --   --  8.4   Recent Labs    03/16/18 03/23/18 04/07/18 09/11/18  AST 15 19 20 18   ALT 13 17 18 12   ALKPHOS 36 40 42 44  PROT 5.5  --   --  5.8  ALBUMIN 3.3  --   --  3.5   Recent Labs    04/07/18 09/11/18 11/24/18  WBC 5.5 4.7 4.1  HGB 14.2 14.0 13.1*  HCT 44 42 40*  PLT 159 145* 140*   Lab Results  Component Value Date   TSH 2.06 11/24/2018   Lab Results  Component Value Date   HGBA1C 5 01/26/2018   Lab Results  Component Value Date   CHOL 146 11/24/2018   HDL 38 11/24/2018   LDLCALC 89 11/24/2018   LDLDIRECT 132.5 09/08/2012   TRIG 91 11/24/2018   CHOLHDL 2.7 11/15/2016    Significant Diagnostic Results in last 30 days:  No results found.  Assessment/Plan Benign essential tremor Fine tremor in  fingers noted, continue Topamax.   Major psychotic depression, recurrent (HCC) stable, not c/o visual hallucination, continue  Sertraline 200mg  qd,  Abilify 2mg  qd, Wllburrin 300mg  qd, Clonazepam 0.5mg  qhs, Mirtazapine 60mg  qd.   Chronic constipation stable, continue  Linzess 128mcg qd, MiraLax qd.   Generalized osteoarthritis of multiple sites No pain complained, continue Tylenol 1000mg  tid, Meloxicam 7.5mg  qd.  Benign prostatic hyperplasia with urinary obstruction No urinary retention, continue Tamsulosin   Hypothyroidism Stable, TSH 2.06 11/24/18, continue Levothyroxine 47mcg qd.   Hypertension Blood pressure is controlled, continue Amlodipine.      Family/ staff Communication: plan of care reviewed with the patient and charge nurse.   Labs/tests ordered:  none  Time spend 25 minutes.

## 2019-01-23 NOTE — Assessment & Plan Note (Signed)
Fine tremor in fingers noted, continue Topamax.

## 2019-01-23 NOTE — Assessment & Plan Note (Signed)
No pain complained, continue Tylenol 1000mg  tid, Meloxicam 7.5mg  qd.

## 2019-01-23 NOTE — Assessment & Plan Note (Signed)
stable, not c/o visual hallucination, continue  Sertraline 200mg  qd,  Abilify 2mg  qd, Wllburrin 300mg  qd, Clonazepam 0.5mg  qhs, Mirtazapine 60mg  qd.

## 2019-01-23 NOTE — Assessment & Plan Note (Signed)
No urinary retention, continue Tamsulosin 

## 2019-01-23 NOTE — Assessment & Plan Note (Signed)
stable, continue  Linzess 135mcg qd, MiraLax qd.

## 2019-01-23 NOTE — Assessment & Plan Note (Signed)
Stable, TSH 2.06 11/24/18, continue Levothyroxine 42mcg qd.

## 2019-01-23 NOTE — Assessment & Plan Note (Signed)
Blood pressure is controlled, continue Amlodipine.  

## 2019-02-12 ENCOUNTER — Other Ambulatory Visit: Payer: Self-pay | Admitting: *Deleted

## 2019-02-12 MED ORDER — CLONAZEPAM 0.5 MG PO TABS: 0.5000 mg | ORAL_TABLET | Freq: Every day | ORAL | 0 refills | Status: DC

## 2019-02-12 NOTE — Telephone Encounter (Signed)
Received fax from [facility] for refill Pended and sent to Dr. Lyndel Safe for approval.

## 2019-03-01 ENCOUNTER — Encounter: Payer: Self-pay | Admitting: Nurse Practitioner

## 2019-03-01 ENCOUNTER — Non-Acute Institutional Stay (SKILLED_NURSING_FACILITY): Payer: Medicare Other | Admitting: Nurse Practitioner

## 2019-03-01 DIAGNOSIS — K5909 Other constipation: Secondary | ICD-10-CM | POA: Diagnosis not present

## 2019-03-01 DIAGNOSIS — R634 Abnormal weight loss: Secondary | ICD-10-CM

## 2019-03-01 DIAGNOSIS — F333 Major depressive disorder, recurrent, severe with psychotic symptoms: Secondary | ICD-10-CM

## 2019-03-01 DIAGNOSIS — I10 Essential (primary) hypertension: Secondary | ICD-10-CM

## 2019-03-01 DIAGNOSIS — N401 Enlarged prostate with lower urinary tract symptoms: Secondary | ICD-10-CM

## 2019-03-01 DIAGNOSIS — N138 Other obstructive and reflux uropathy: Secondary | ICD-10-CM

## 2019-03-01 DIAGNOSIS — M159 Polyosteoarthritis, unspecified: Secondary | ICD-10-CM

## 2019-03-01 DIAGNOSIS — E039 Hypothyroidism, unspecified: Secondary | ICD-10-CM

## 2019-03-01 NOTE — Assessment & Plan Note (Signed)
No c/o pain today, continue Tylenol, Mobic.

## 2019-03-01 NOTE — Assessment & Plan Note (Signed)
Stable, continue Linzess, MiraLax.  

## 2019-03-01 NOTE — Assessment & Plan Note (Signed)
The patient stated he sleeps well, smiled upon my examination today, continue Sertraline, Topamax,  Mirtazapine, Clonazepam, Wellbutrin, Abilify.

## 2019-03-01 NOTE — Progress Notes (Signed)
Location:   [facility] Room Number: 37 Place of Service:  SNF (31) Provider:  Madelena Maturin NP  [name], MD  Patient Care Team: [name], MD as PCP - General (Internal Medicine) [name], MD as Consulting Physician (Urology) [name], MD as Consulting Physician (Neurology) [name], MD as Consulting Physician (Dermatology) [name], MD (Inactive) as Consulting Physician (Gastroenterology) [name], MD as Consulting Physician (Orthopedic Surgery) [name], MD as Consulting Physician (Cardiology) [name], MD as Consulting Physician (Ophthalmology) [name], MD as Referring Physician (Psychiatry) [name], MD as Consulting Physician (General Surgery) [name], MD as Consulting Physician (Orthopedic Surgery) [name], [name], NP as Nurse Practitioner (Family Medicine) Tilford Deaton X, NP as Nurse Practitioner (Internal Medicine)  Extended Emergency Contact Information Primary Emergency Contact: Davia,[name] Address: [address]          [place], [place] [address] [place] Phone: [phone] Relation: Spouse  Code Status:  Full Code Goals of care: Advanced Directive information Advanced Directives 01/23/2019  Does Patient Have a Medical Advance Directive? Yes  Type of Advance Directive Living will;Healthcare Power of Attorney  Does patient want to make changes to medical advance directive? No - Patient declined  Copy of [place] in Chart? Yes - validated most recent copy scanned in chart (See row information)  Pre-existing out of facility DNR order (yellow form or pink MOST form) -     Chief Complaint  Patient presents with  . Medical Management of Chronic Issues    HPI:  Pt is a 86 y.o. male seen today for medical management of chronic diseases.    The patient resides in SNF [facility] for safety, care assistance, w/c  for mobility, weight loss about #8Ibs in the past 3 months, he denied nausea, vomiting, abd pain. Hx of depression, on Sertraline, Topamax,  Mirtazapine, Clonazepam, Wellbutrin, Abilify. HTN, blood pressure is controlled on Amlodipine. General OA, stable, on Tylenol, Meloxicam. . Hypothyroidism, on Levothyroxine 17mg qd. BPH, stalbe, on Tamsulosin. Constipation, stable, on MiraLax, Linzess.    Past Medical History:  Diagnosis Date  . Acute urinary retention 01/28/2013  . AKI (acute kidney injury) ([place] 01/26/2013  . Altered mental status 09/09/2013  . Anemia    B12 deficiency  . ANXIETY 06/07/2007   Qualifier: Diagnosis of  By: MTalbert CageCMA (AP[place], [place]    . ARTHRITIS 06/07/2007   Qualifier: Diagnosis of  By: MTalbert CageCMA ([place], June    . Balance problems 04/05/2011  . Benign essential tremor   . Benign fibroma of prostate 10/11/2011  . Bilateral inguinal hernia 10/09/2012  . Bilateral leg edema 09/13/2012  . Bladder retention 02/05/2013  . Bradycardia, sinus 08/02/2012  . CAROTID ARTERY DISEASE 03/31/2007   Qualifier: Diagnosis of  By: HLinna DarnerMD, WGwyndolyn Saxon   . Carotid stenosis    s/p L CEA  . Cataract, nuclear 03/21/2014  . 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 Cervical spinal stenosis   . Cervical stenosis of spine   . Critical lower limb ischemia   . Degeneration macular 11/02/2011  . Depression    Dr SAlbertine Patricia . Difficulty in walking(719.7) 04/05/2011  . ESOPHAGITIS 08/06/2002   Qualifier: Diagnosis of  By: MTalbert CageCMA ([place], [place]    . Essential and other specified forms of tremor 04/18/2013  . Fall at home 01/06/2017   left hip  pain  . Falls   . Family history of colon cancer 07/24/2012  . Fecal impaction ([place]) 10/11/2012  . Femoral hernia, bilateral s/p lap repair 01/16/2013 01/16/2013  . Glaucoma, compensated 03/21/2014  . H/O cardiovascular stress test    a. 02/2003 -> no ischemia/infarct.  Marland Kitchen HAMMER TOE 04/23/2010   Qualifier: Diagnosis of  By: Linna Darner MD, Gwyndolyn Saxon    . Hematoma of leg     . Hip hematoma, right 09/09/2013  . History of shingles 2009  . HTN (hypertension) 08/22/2012  . Hx of echocardiogram    Echocardiogram 08/01/12: Mild LVH, EF 55-60%, normal wall motion.  . Hydrocele 10/11/2011  . Hyperlipidemia   . 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 Hypothyroidism   . Macular degeneration disease   . Macular edema   . Migraines    Dr Jannifer Franklin  . MORTON'S NEUROMA, LEFT 08/22/2007   Qualifier: Diagnosis of  By: Linna Darner MD, Gwyndolyn Saxon    . PAD (peripheral artery disease) ([place])   . Peripheral neuropathy   . Personal history of colonic polyps 01/05/2013   2014 2 mm adenomatous polyp   . PREMATURE ATRIAL CONTRACTIONS 03/14/2006   Qualifier: Diagnosis of  By: Linna Darner MD, Gwendolyn Lima 12/16/2011  . SPINAL STENOSIS 03/14/2006   Qualifier: Diagnosis of  By: Linna Darner MD, William   Cervical spine    . Testosterone deficiency    Dr Lawerance Bach, [facility]  . Unspecified vitamin D deficiency 08/06/2012  . Urge incontinence 10/09/2012  . Urinary tract infection, site not specified 02/13/2013  . Weakness 08/02/2012   Past Surgical History:  Procedure Laterality Date  . APPENDECTOMY  1941  . BREAST CYST EXCISION Left 01/16/2013   Procedure: MASS EXCISION AXILLA;  Surgeon: Adin Hector, MD;  Location: [facility];  Service: General;  Laterality: Left;  . CAROTID ENDARTERECTOMY  2005    L  . CATARACT EXTRACTION Right   . COLONOSCOPY  2014   Dr. Deatra Ina  . EYE SURGERY Right 02/2012   retina  . INGUINAL HERNIA REPAIR Bilateral 01/16/2013   Procedure: LAPAROSCOPIC BILATERAL INGUINAL DIRECT AND INDIRECT, FEMORAL, AND OBTURATOR HERNIAS;  Surgeon: Adin Hector, MD;  Location: [facility];  Service: General;  Laterality: Bilateral;  . INSERTION OF MESH N/A 01/16/2013   Procedure: INSERTION OF MESH;  Surgeon: Adin Hector, MD;  Location: [facility];  Service: General;  Laterality: N/A;  . LUMBAR LAMINECTOMY    . TONSILLECTOMY AND ADENOIDECTOMY    . TOTAL HIP ARTHROPLASTY  2005   L    Allergies   Allergen Reactions  . Latex Rash  . Tape Rash    Paper tape is ok to use     Allergies as of 03/01/2019      Reactions   Latex Rash   Tape Rash   Paper tape is ok to use      Medication List       Accurate as of March 01, 2019 12:41 PM. If you have any questions, ask your nurse or doctor.        acetaminophen 500 MG tablet Commonly known as: TYLENOL Take 1,000 mg by mouth 3 (three) times daily.   amLODipine 5 MG tablet Commonly known as: NORVASC Take 7.5 mg by mouth daily.   ARIPiprazole 2 MG tablet Commonly known as: Abilify Take 1 tablet (2 mg total) by mouth daily.   aspirin 81 MG tablet Take 81 mg by mouth every morning.   atorvastatin 20 MG tablet Commonly known  as: LIPITOR Take 20 mg by mouth daily.   B-complex with vitamin C tablet Take 1 tablet by mouth daily.   bimatoprost 0.01 % Soln Commonly known as: LUMIGAN Place 1 drop into both eyes at bedtime.   buPROPion 300 MG 24 hr tablet Commonly known as: WELLBUTRIN XL Take 300 mg by mouth daily. per psychiatry recommendation   clonazePAM 0.5 MG tablet Commonly known as: KLONOPIN Take 1 tablet (0.5 mg total) by mouth at bedtime.   erythromycin ophthalmic ointment Place 1 application into both eyes at bedtime.   ICAPS PO Take 1 tablet by mouth daily.   ketoconazole 2 % cream Commonly known as: NIZORAL Apply 1 application topically 2 (two) times daily as needed for irritation.   levothyroxine 50 MCG tablet Commonly known as: SYNTHROID Take 50 mcg by mouth daily before breakfast. Take 1/2 tablet on Sunday mornings and 1 on other days.   linaclotide 145 MCG Caps capsule Commonly known as: LINZESS Take 1 capsule (145 mcg total) by mouth daily before breakfast.   loratadine 10 MG tablet Commonly known as: CLARITIN Take 10 mg by mouth daily.   Melatonin 3 MG Tabs Take 1 tablet by mouth at bedtime.   meloxicam 7.5 MG tablet Commonly known as: MOBIC Take 7.5 mg by mouth daily. As  needed   mirtazapine 30 MG tablet Commonly known as: REMERON Take 60 mg by mouth at bedtime.   polyethylene glycol 17 g packet Commonly known as: MIRALAX / GLYCOLAX Take 17 g by mouth daily. Hold for loose stool   sertraline 100 MG tablet Commonly known as: ZOLOFT Take 200 mg by mouth every morning.   Systane 0.4-0.3 % Soln Generic drug: Polyethyl Glycol-Propyl Glycol Apply 1 drop to eye 4 (four) times daily.   tamsulosin 0.4 MG Caps capsule Commonly known as: FLOMAX TAKE 1 CAPSULE (0.4 MG TOTAL) BY MOUTH DAILY.   topiramate 25 MG tablet Commonly known as: TOPAMAX TAKE 1 TABLET BY MOUTH EVERY MORNING AND 2 TABLETS BY MOUTH EVERY EVENING   Vitamin D3 25 MCG (1000 UT) Caps Take 1,000 Units by mouth daily.      ROS was provided with assistance of staff.  Review of Systems  Constitutional: Positive for unexpected weight change. Negative for activity change, appetite change, chills, diaphoresis, fatigue and fever.  HENT: Positive for hearing loss. Negative for congestion and voice change.   Eyes: Positive for visual disturbance.  Respiratory: Negative for cough, shortness of breath and wheezing.   Cardiovascular: Negative for chest pain, palpitations and leg swelling.  Gastrointestinal: Negative for abdominal distention, abdominal pain, constipation, diarrhea, nausea and vomiting.  Genitourinary: Negative for difficulty urinating, dysuria and urgency.  Musculoskeletal: Positive for arthralgias and gait problem.  Skin: Negative for color change and pallor.  Neurological: Positive for tremors. Negative for dizziness, speech difficulty, weakness and headaches.       Memory lapses.   Psychiatric/Behavioral: Negative for agitation, behavioral problems, hallucinations and sleep disturbance. The patient is not nervous/anxious.     Immunization History  Administered Date(s) Administered  . DTaP 11/05/2005  . Hepatitis B 08-17-1932  . Influenza Split 11/12/2011, 11/29/2013  .  Influenza Whole 10/30/2007, 11/12/2008, 11/26/2009  . Influenza, High Dose Seasonal PF 12/31/2015, 11/24/2016, 11/30/2018  . Influenza,inj,quad, With Preservative 10/16/2016  . Influenza-Unspecified 11/28/2017  . PPD Test 06/23/2011  . Pneumococcal Conjugate-13 01/14/2016  . Pneumococcal Polysaccharide-23 02/16/1999  . Tdap 01/13/2017  . Varicella 11/09/2005  . Zoster 08/30/2006   Pertinent  Health Maintenance Due  Topic  Date Due  . INFLUENZA VACCINE  Completed  . PNA vac Low Risk Adult  Completed   Fall Risk  10/28/2017 10/27/2016 06/09/2016 02/03/2016 10/28/2015  Falls in the past year? Yes Yes Yes Yes Yes  Number falls in past yr: 1 2 or more 2 or more 2 or more 2 or more  Comment - - - 2 falls in last 2 months -  Injury with Fall? Yes Yes No Yes No  Comment - L hip - scraps and bruises -  Risk Factor Category  - - - - -  Risk for fall due to : - - - - Impaired balance/gait  Follow up - - - - -   Functional Status Survey:    Vitals:   03/01/19 0927  BP: 108/66  Pulse: 60  Resp: 20  Temp: 97.9 F (36.6 C)  SpO2: 96%  Weight: 137 lb 12.8 oz (62.5 kg)  Height: 6' (1.829 m)   Body mass index is 18.69 kg/m. Physical Exam Vitals and nursing note reviewed.  Constitutional:      General: He is not in acute distress.    Appearance: Normal appearance. He is not ill-appearing, toxic-appearing or diaphoretic.  HENT:     Head: Normocephalic and atraumatic.     Nose: Nose normal.     Mouth/Throat:     Mouth: Mucous membranes are moist.  Eyes:     Extraocular Movements: Extraocular movements intact.     Conjunctiva/sclera: Conjunctivae normal.     Pupils: Pupils are equal, round, and reactive to light.     Comments: Left eye blind.   Cardiovascular:     Rate and Rhythm: Normal rate and regular rhythm.     Heart sounds: No murmur.  Pulmonary:     Effort: Pulmonary effort is normal.     Breath sounds: No wheezing, rhonchi or rales.  Abdominal:     General: Bowel sounds  are normal. There is no distension.     Palpations: Abdomen is soft.     Tenderness: There is no abdominal tenderness. There is no right CVA tenderness, left CVA tenderness, guarding or rebound.  Musculoskeletal:     Cervical back: Normal range of motion and neck supple.     Right lower leg: No edema.     Left lower leg: No edema.  Skin:    General: Skin is warm and dry.  Neurological:     General: No focal deficit present.     Mental Status: He is alert. Mental status is at baseline.     Motor: No weakness.     Coordination: Coordination normal.     Gait: Gait abnormal.     Comments: Oriented to person, place. Fine, mild resting tremor in fingers, not disabling.   Psychiatric:        Mood and Affect: Mood normal.        Behavior: Behavior normal.     Labs reviewed: Recent Labs    03/16/18 0000 03/16/18 0000 03/23/18 0000 04/07/18 0000 09/11/18 0000  NA 144   < > 139 142 142  K 4.5   < > 4.2 4.3 4.1  CL 111  --   --   --  112  CO2 27  --   --   --  23  BUN 38*   < > 40* 34* 34*  CREATININE 1.4*   < > 1.4* 1.2 1.3  CALCIUM 9.0  --   --   --  8.4   < > =  values in this interval not displayed.   Recent Labs    03/16/18 0000 03/16/18 0000 03/23/18 0000 04/07/18 0000 09/11/18 0000  AST 15   < > '19 20 18  '$ ALT 13   < > '17 18 12  '$ ALKPHOS 36   < > 40 42 44  PROT 5.5  --   --   --  5.8  ALBUMIN 3.3  --   --   --  3.5   < > = values in this interval not displayed.   Recent Labs    04/07/18 0000 09/11/18 0000 11/24/18 0000  WBC 5.5 4.7 4.1  HGB 14.2 14.0 13.1*  HCT 44 42 40*  PLT 159 145* 140*   Lab Results  Component Value Date   TSH 2.06 11/24/2018   Lab Results  Component Value Date   HGBA1C 5 01/26/2018   Lab Results  Component Value Date   CHOL 146 11/24/2018   HDL 38 11/24/2018   LDLCALC 89 11/24/2018   LDLDIRECT 132.5 09/08/2012   TRIG 91 11/24/2018   CHOLHDL 2.7 11/15/2016    Significant Diagnostic Results in last 30 days:  No results  found.  Assessment/Plan Weight loss About #8Ibs in the past 3 months, update CBC/diff, CMP/eGFR, TSH  Major psychotic depression, recurrent ([place]) The patient stated he sleeps well, smiled upon my examination today, continue Sertraline, Topamax,  Mirtazapine, Clonazepam, Wellbutrin, Abilify.  Hypertension Blood pressure is controlled, continue Amlodipine.   Chronic constipation Stable, continue Linzess, MiraLax.   Hypothyroidism Continue Levothyroxine 103mg qd, update TSH in setting of wt loss.   Generalized osteoarthritis of multiple sites No c/o pain today, continue Tylenol, Mobic.  Benign prostatic hyperplasia with urinary obstruction Stable, continue Tamsulosin.      Family/ staff Communication: plan of care reviewed with the patient and charge nurse.   Labs/tests ordered:  CBC/diff, CMP/eGFR, TSH  Time spend 25 minutes.

## 2019-03-01 NOTE — Assessment & Plan Note (Signed)
Continue Levothyroxine 61mcg qd, update TSH in setting of wt loss.

## 2019-03-01 NOTE — Assessment & Plan Note (Signed)
About #8Ibs in the past 3 months, update CBC/diff, CMP/eGFR, TSH

## 2019-03-01 NOTE — Assessment & Plan Note (Signed)
Stable, continue Tamsulosin 

## 2019-03-01 NOTE — Assessment & Plan Note (Signed)
Blood pressure is controlled, continue Amlodipine.  

## 2019-03-30 ENCOUNTER — Encounter: Payer: Self-pay | Admitting: Internal Medicine

## 2019-03-30 ENCOUNTER — Non-Acute Institutional Stay (SKILLED_NURSING_FACILITY): Payer: Medicare Other | Admitting: Internal Medicine

## 2019-03-30 DIAGNOSIS — K5909 Other constipation: Secondary | ICD-10-CM | POA: Diagnosis not present

## 2019-03-30 DIAGNOSIS — E785 Hyperlipidemia, unspecified: Secondary | ICD-10-CM

## 2019-03-30 DIAGNOSIS — G250 Essential tremor: Secondary | ICD-10-CM

## 2019-03-30 DIAGNOSIS — R634 Abnormal weight loss: Secondary | ICD-10-CM

## 2019-03-30 DIAGNOSIS — F333 Major depressive disorder, recurrent, severe with psychotic symptoms: Secondary | ICD-10-CM | POA: Diagnosis not present

## 2019-03-30 DIAGNOSIS — I10 Essential (primary) hypertension: Secondary | ICD-10-CM | POA: Diagnosis not present

## 2019-03-30 DIAGNOSIS — N401 Enlarged prostate with lower urinary tract symptoms: Secondary | ICD-10-CM

## 2019-03-30 DIAGNOSIS — N138 Other obstructive and reflux uropathy: Secondary | ICD-10-CM

## 2019-03-30 DIAGNOSIS — E039 Hypothyroidism, unspecified: Secondary | ICD-10-CM

## 2019-03-30 NOTE — Progress Notes (Signed)
Location:  [facility] Room Number: 37 Place of Service:  SNF (31)  Provider:   Code Status:  Goals of Care:  Advanced Directives 03/30/2019  Does Patient Have a Medical Advance Directive? Yes  Type of Paramedic of [place];Living will;Out of facility DNR (pink MOST or yellow form)  Does patient want to make changes to medical advance directive? No - Patient declined  Copy of [place] in Chart? Yes - validated most recent copy scanned in chart (See row information)  Pre-existing out of facility DNR order (yellow form or pink MOST form) Pink MOST/Yellow Form most recent copy in chart - Physician notified to receive inpatient order     Chief Complaint  Patient presents with  . Medical Management of Chronic Issues    Routine  . Acute Visit    Low BP    HPI: Patient is a 86 y.o. male seen today for medical management of chronic diseases.   Patient seen for number of Active issues  He has h/o HTN, Hyperlipidemia, PAD, BPH , CKD stage 2, Hypothyroidism, and Major Depression Also Has h/o Cervial Stenosis with Compressive Myelopathy, BPH  Weight Loss Does have good appetite. Working with Dietary ? Change dose of Topamax  Depression Continues to be on many Meds Has not seen his Psychiatrist who per patient has discharged him Will have him see in house Psychologist He will talk to his wife to see if he needs new Psychiatrist Previous GDR in facility has failed Low BP  Denies any dizziness.   Past Medical History:  Diagnosis Date  . Acute urinary retention 01/28/2013  . AKI (acute kidney injury) ([place]) 01/26/2013  . Altered mental status 09/09/2013  . Anemia    B12 deficiency  . ANXIETY 06/07/2007   Qualifier: Diagnosis of  By: Talbert Cage CMA ([place]), June    . ARTHRITIS 06/07/2007   Qualifier: Diagnosis of  By: Talbert Cage CMA ([place]), June    . Balance problems 04/05/2011  . Benign essential tremor   . Benign  fibroma of prostate 10/11/2011  . Bilateral inguinal hernia 10/09/2012  . Bilateral leg edema 09/13/2012  . Bladder retention 02/05/2013  . Bradycardia, sinus 08/02/2012  . CAROTID ARTERY DISEASE 03/31/2007   Qualifier: Diagnosis of  By: Linna Darner MD, Gwyndolyn Saxon    . Carotid stenosis    s/p L CEA  . Cataract, nuclear 03/21/2014  . 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 Cervical spinal stenosis   . Cervical stenosis of spine   . Critical lower limb ischemia   . Degeneration macular 11/02/2011  . Depression    Dr Albertine Patricia  . Difficulty in walking(719.7) 04/05/2011  . ESOPHAGITIS 08/06/2002   Qualifier: Diagnosis of  By: Talbert Cage CMA ([place]), June    . Essential and other specified forms of tremor 04/18/2013  . Fall at home 01/06/2017   left hip pain  . Falls   . Family history of colon cancer 07/24/2012  . Fecal impaction ([place]) 10/11/2012  . Femoral hernia, bilateral s/p lap repair 01/16/2013 01/16/2013  . Glaucoma, compensated 03/21/2014  . H/O cardiovascular stress test    a. 02/2003 -> no ischemia/infarct.  Marland Kitchen HAMMER TOE 04/23/2010   Qualifier: Diagnosis of  By: Linna Darner MD, Gwyndolyn Saxon    . Hematoma of leg   . Hip hematoma, right 09/09/2013  . History of shingles 2009  . HTN (hypertension) 08/22/2012  . Hx of echocardiogram    Echocardiogram 08/01/12: Mild LVH, EF 55-60%, normal wall motion.  Marland Kitchen  Hydrocele 10/11/2011  . Hyperlipidemia   . 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 Hypothyroidism   . Macular degeneration disease   . Macular edema   . Migraines    Dr Jannifer Franklin  . MORTON'S NEUROMA, LEFT 08/22/2007   Qualifier: Diagnosis of  By: Linna Darner MD, Gwyndolyn Saxon    . PAD (peripheral artery disease) ([place])   . Peripheral neuropathy   . Personal history of colonic polyps 01/05/2013   2014 2 mm adenomatous polyp   . PREMATURE ATRIAL CONTRACTIONS 03/14/2006   Qualifier: Diagnosis of  By: Linna Darner MD, Gwendolyn Lima 12/16/2011  . SPINAL STENOSIS 03/14/2006   Qualifier: Diagnosis of  By: Linna Darner MD, [place]   Cervical  spine    . Testosterone deficiency    Dr Lawerance Bach, [facility]  . Unspecified vitamin D deficiency 08/06/2012  . Urge incontinence 10/09/2012  . Urinary tract infection, site not specified 02/13/2013  . Weakness 08/02/2012    Past Surgical History:  Procedure Laterality Date  . APPENDECTOMY  1941  . BREAST CYST EXCISION Left 01/16/2013   Procedure: MASS EXCISION AXILLA;  Surgeon: Adin Hector, MD;  Location: [facility];  Service: General;  Laterality: Left;  . CAROTID ENDARTERECTOMY  2005    L  . CATARACT EXTRACTION Right   . COLONOSCOPY  2014   Dr. Deatra Ina  . EYE SURGERY Right 02/2012   retina  . INGUINAL HERNIA REPAIR Bilateral 01/16/2013   Procedure: LAPAROSCOPIC BILATERAL INGUINAL DIRECT AND INDIRECT, FEMORAL, AND OBTURATOR HERNIAS;  Surgeon: Adin Hector, MD;  Location: [facility];  Service: General;  Laterality: Bilateral;  . INSERTION OF MESH N/A 01/16/2013   Procedure: INSERTION OF MESH;  Surgeon: Adin Hector, MD;  Location: [facility];  Service: General;  Laterality: N/A;  . LUMBAR LAMINECTOMY    . TONSILLECTOMY AND ADENOIDECTOMY    . TOTAL HIP ARTHROPLASTY  2005   L    Allergies  Allergen Reactions  . Latex Rash  . Tape Rash    Paper tape is ok to use     Outpatient Encounter Medications as of 03/30/2019  Medication Sig  . acetaminophen (TYLENOL) 500 MG tablet Take 1,000 mg by mouth 3 (three) times daily.   . Amino Acids-Protein Hydrolys (FEEDING SUPPLEMENT, PRO-STAT SUGAR FREE 64,) LIQD Take 30 mLs by mouth daily. To promote wound healing  . amLODipine (NORVASC) 5 MG tablet Take 7.5 mg by mouth daily.   . ARIPiprazole (ABILIFY) 2 MG tablet Take 1 tablet (2 mg total) by mouth daily.  Marland Kitchen aspirin 81 MG tablet Take 81 mg by mouth every morning.   Marland Kitchen atorvastatin (LIPITOR) 20 MG tablet Take 20 mg by mouth daily.  . B Complex-C (B-COMPLEX WITH VITAMIN C) tablet Take 1 tablet by mouth daily.  . bimatoprost (LUMIGAN) 0.01 % SOLN Place 1 drop into both eyes at bedtime.  Marland Kitchen buPROPion  (WELLBUTRIN XL) 300 MG 24 hr tablet Take 300 mg by mouth daily. per psychiatry recommendation  . Cholecalciferol (VITAMIN D3) 1000 units CAPS Take 1,000 Units by mouth daily.   . clonazePAM (KLONOPIN) 0.5 MG tablet Take 1 tablet (0.5 mg total) by mouth at bedtime.  Marland Kitchen erythromycin ophthalmic ointment Place 1 application into both eyes at bedtime.   Marland Kitchen ketoconazole (NIZORAL) 2 % cream Apply 1 application topically 2 (two) times daily as needed for irritation.   . lactose free nutrition (BOOST) LIQD Take 237 mLs by mouth 2 (two) times daily between meals. For weight loss  . levothyroxine (  SYNTHROID, LEVOTHROID) 50 MCG tablet Take 50 mcg by mouth daily before breakfast. EXCEPT Take 1/2 tablet on Sunday mornings.  Marland Kitchen linaclotide (LINZESS) 145 MCG CAPS capsule Take 1 capsule (145 mcg total) by mouth daily before breakfast.  . loratadine (CLARITIN) 10 MG tablet Take 10 mg by mouth daily.  . Melatonin 3 MG TABS Take 1 tablet by mouth at bedtime.  . meloxicam (MOBIC) 7.5 MG tablet Take 7.5 mg by mouth daily. As needed  . mirtazapine (REMERON) 30 MG tablet Take 60 mg by mouth at bedtime.  . Multiple Vitamins-Minerals (ICAPS PO) Take 1 tablet by mouth daily.  Vladimir Faster Glycol-Propyl Glycol (SYSTANE) 0.4-0.3 % SOLN Apply 1 drop to eye 4 (four) times daily.  . polyethylene glycol (MIRALAX / GLYCOLAX) packet Take 17 g by mouth daily. Hold for loose stool  . sertraline (ZOLOFT) 100 MG tablet Take 200 mg by mouth every morning.   . tamsulosin (FLOMAX) 0.4 MG CAPS capsule TAKE 1 CAPSULE (0.4 MG TOTAL) BY MOUTH DAILY.  Marland Kitchen topiramate (TOPAMAX) 25 MG tablet TAKE 1 TABLET BY MOUTH EVERY MORNING AND 2 TABLETS BY MOUTH EVERY EVENING   No facility-administered encounter medications on file as of 03/30/2019.    Review of Systems:  Review of Systems  Constitutional: Positive for unexpected weight change.  HENT: Negative.   Respiratory: Negative.   Cardiovascular: Negative.   Gastrointestinal: Positive for  constipation.  Genitourinary: Negative.   Musculoskeletal: Positive for arthralgias.  Skin: Negative.   Neurological: Positive for weakness.  Psychiatric/Behavioral: Positive for confusion and dysphoric mood.  All other systems reviewed and are negative.   Health Maintenance  Topic Date Due  . TETANUS/TDAP  01/14/2027  . INFLUENZA VACCINE  Completed  . PNA vac Low Risk Adult  Completed    Physical Exam: Vitals:   03/30/19 1309  BP: 124/69  Pulse: 95  Resp: 17  Temp: (!) 96.5 F (35.8 C)  SpO2: 97%  Weight: 135 lb 3.2 oz (61.3 kg)  Height: 6' (1.829 m)   Body mass index is 18.34 kg/m. Physical Exam Vitals reviewed.  Constitutional:      Appearance: Normal appearance.  HENT:     Head: Normocephalic.     Nose: Nose normal.     Mouth/Throat:     Mouth: Mucous membranes are moist.     Pharynx: Oropharynx is clear.  Eyes:     Pupils: Pupils are equal, round, and reactive to light.  Cardiovascular:     Rate and Rhythm: Normal rate and regular rhythm.  Pulmonary:     Effort: Pulmonary effort is normal. No respiratory distress.     Breath sounds: Normal breath sounds. No wheezing.  Abdominal:     General: Abdomen is flat. Bowel sounds are normal.     Palpations: Abdomen is soft.  Musculoskeletal:        General: No swelling.     Cervical back: Neck supple.  Skin:    General: Skin is warm.  Neurological:     General: No focal deficit present.     Mental Status: He is alert and oriented to person, place, and time.  Psychiatric:        Mood and Affect: Mood normal.     Labs reviewed: Basic Metabolic Panel: Recent Labs    04/07/18 0000 09/11/18 0000 11/24/18 0000  NA 142 142  --   K 4.3 4.1  --   CL  --  112  --   CO2  --  23  --  BUN 34* 34*  --   CREATININE 1.2 1.3  --   CALCIUM  --  8.4  --   TSH 2.37 1.77 2.06   Liver Function Tests: Recent Labs    04/07/18 0000 09/11/18 0000  AST 20 18  ALT 18 12  ALKPHOS 42 44  PROT  --  5.8  ALBUMIN   --  3.5   No results for input(s): LIPASE, AMYLASE in the last 8760 hours. No results for input(s): AMMONIA in the last 8760 hours. CBC: Recent Labs    04/07/18 0000 09/11/18 0000 11/24/18 0000  WBC 5.5 4.7 4.1  HGB 14.2 14.0 13.1*  HCT 44 42 40*  PLT 159 145* 140*   Lipid Panel: Recent Labs    09/11/18 0000 11/24/18 0000  CHOL 151 146  HDL 39 38  LDLCALC 93 89  TRIG 99 91   Lab Results  Component Value Date   HGBA1C 5 01/26/2018    Procedures since last visit: No results found.  Assessment/Plan Weight loss ? Etiology Patient says he has been eating well. Labs done recently were all normal Working with Dietary Will decrease the dose of Topomax to see if it helps  Major psychotic depression, recurrent ([place]) On number of Meds Zoloft, Remeron, Wellbutrin and Abilify Prevous GDR has not worked  Essential hypertension Running low BP Decrease the Norvasc to 2.5 mg Chronic constipation On Linzess Acquired hypothyroidism TSH normal in 10/20 Benign prostatic hyperplasia with urinary obstruction Doing well on Flomax Hyperlipidemia LDL goal <70 LDL 89 On Lipitor  Benign essential tremor On Topomax Will try to reduce dose to see if it helps his weight loss     Labs/tests ordered:  * No order type specified * Next appt:  Visit date not found   Total time spent in this patient care encounter was 45 _  minutes; greater than 50% of the visit spent counseling patient and staff, reviewing records , Labs and coordinating care for problems addressed at this encounter.

## 2019-04-13 ENCOUNTER — Non-Acute Institutional Stay (SKILLED_NURSING_FACILITY): Payer: Medicare Other | Admitting: Nurse Practitioner

## 2019-04-13 ENCOUNTER — Encounter: Payer: Self-pay | Admitting: Nurse Practitioner

## 2019-04-13 DIAGNOSIS — M159 Polyosteoarthritis, unspecified: Secondary | ICD-10-CM

## 2019-04-13 DIAGNOSIS — W19XXXA Unspecified fall, initial encounter: Secondary | ICD-10-CM | POA: Diagnosis not present

## 2019-04-13 DIAGNOSIS — I10 Essential (primary) hypertension: Secondary | ICD-10-CM

## 2019-04-13 DIAGNOSIS — M129 Arthropathy, unspecified: Secondary | ICD-10-CM

## 2019-04-13 DIAGNOSIS — R2681 Unsteadiness on feet: Secondary | ICD-10-CM

## 2019-04-13 NOTE — Progress Notes (Signed)
Location:   [facility] Room Number: 37 Place of Service:  SNF (31) Provider:  Terald Jump NP  [name], MD  Patient Care Team: [name], MD as PCP - General (Internal Medicine) [name], MD as Consulting Physician (Urology) [name], MD as Consulting Physician (Neurology) [name], MD as Consulting Physician (Dermatology) [name], MD (Inactive) as Consulting Physician (Gastroenterology) [name], MD as Consulting Physician (Orthopedic Surgery) [name], MD as Consulting Physician (Cardiology) [name], MD as Consulting Physician (Ophthalmology) [name], MD as Referring Physician (Psychiatry) [name], MD as Consulting Physician (General Surgery) [name], MD as Consulting Physician (Orthopedic Surgery) [name], [name], NP as Nurse Practitioner (Family Medicine) Altha Sweitzer X, NP as Nurse Practitioner (Internal Medicine)  Extended Emergency Contact Information Primary Emergency Contact: Astorino,[name] Address: [address]          [place]          [place], [place] [address] [place] Phone: [phone] Relation: Spouse  Code Status:  DNR Goals of care: Advanced Directive information Advanced Directives 04/13/2019  Does Patient Have a Medical Advance Directive? Yes  Type of Advance Directive Out of facility DNR (pink MOST or yellow form);[place];Living will  Does patient want to make changes to medical advance directive? No - Patient declined  Copy of [place] in Chart? Yes - validated most recent copy scanned in chart (See row information)  Pre-existing out of facility DNR order (yellow form or pink MOST form) Yellow form placed in chart (order not valid for inpatient use)     Chief Complaint  Patient presents with  . Acute Visit    Fall    HPI:  Pt is a 86 y.o. male seen today for an acute visit for  reported the patient's fall,  the patent was found sitting on the floor beside his bed 04/10/19, reported no complain of pain or discomfort. Hx of OA, multiple sits, w/c for mobility, taking tylenol 1000mg  tid. HTN, blood pressure is controlled on Amlodipine 7.5mg  qd, ASA 81mg  qd. The patient resides in [facility] for safety, care assistance.     Past Medical History:  Diagnosis Date  . Acute urinary retention 01/28/2013  . AKI (acute kidney injury) ([place]) 01/26/2013  . Altered mental status 09/09/2013  . Anemia    B12 deficiency  . ANXIETY 06/07/2007   Qualifier: Diagnosis of  By: Talbert Cage CMA ([place]), June    . ARTHRITIS 06/07/2007   Qualifier: Diagnosis of  By: Talbert Cage CMA ([place]), June    . Balance problems 04/05/2011  . Benign essential tremor   . Benign fibroma of prostate 10/11/2011  . Bilateral inguinal hernia 10/09/2012  . Bilateral leg edema 09/13/2012  . Bladder retention 02/05/2013  . Bradycardia, sinus 08/02/2012  . CAROTID ARTERY DISEASE 03/31/2007   Qualifier: Diagnosis of  By: Linna Darner MD, Gwyndolyn Saxon    . Carotid stenosis    s/p L CEA  . Cataract, nuclear 03/21/2014  . 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 Cervical spinal stenosis   . Cervical stenosis of spine   . Critical lower limb ischemia   . Degeneration macular 11/02/2011  . Depression    Dr Albertine Patricia  . Difficulty in walking(719.7) 04/05/2011  . ESOPHAGITIS 08/06/2002   Qualifier: Diagnosis of  By: Talbert Cage CMA ([place]), June    . Essential and other specified forms of tremor 04/18/2013  . Fall at  home 01/06/2017   left hip pain  . Falls   . Family history of colon cancer 07/24/2012  . Fecal impaction ([place]) 10/11/2012  . Femoral hernia, bilateral s/p lap repair 01/16/2013 01/16/2013  . Glaucoma, compensated 03/21/2014  . H/O cardiovascular stress test    a. 02/2003 -> no ischemia/infarct.  Marland Kitchen HAMMER TOE 04/23/2010   Qualifier: Diagnosis of  By: Linna Darner MD, Gwyndolyn Saxon    . Hematoma of leg   . Hip hematoma, right 09/09/2013  . History of  shingles 2009  . HTN (hypertension) 08/22/2012  . Hx of echocardiogram    Echocardiogram 08/01/12: Mild LVH, EF 55-60%, normal wall motion.  . Hydrocele 10/11/2011  . Hyperlipidemia   . 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 Hypothyroidism   . Macular degeneration disease   . Macular edema   . Migraines    Dr Jannifer Franklin  . MORTON'S NEUROMA, LEFT 08/22/2007   Qualifier: Diagnosis of  By: Linna Darner MD, Gwyndolyn Saxon    . PAD (peripheral artery disease) ([place])   . Peripheral neuropathy   . Personal history of colonic polyps 01/05/2013   2014 2 mm adenomatous polyp   . PREMATURE ATRIAL CONTRACTIONS 03/14/2006   Qualifier: Diagnosis of  By: Linna Darner MD, Gwendolyn Lima 12/16/2011  . SPINAL STENOSIS 03/14/2006   Qualifier: Diagnosis of  By: Linna Darner MD, William   Cervical spine    . Testosterone deficiency    Dr Lawerance Bach, [facility]  . Unspecified vitamin D deficiency 08/06/2012  . Urge incontinence 10/09/2012  . Urinary tract infection, site not specified 02/13/2013  . Weakness 08/02/2012   Past Surgical History:  Procedure Laterality Date  . APPENDECTOMY  1941  . BREAST CYST EXCISION Left 01/16/2013   Procedure: MASS EXCISION AXILLA;  Surgeon: Adin Hector, MD;  Location: [facility];  Service: General;  Laterality: Left;  . CAROTID ENDARTERECTOMY  2005    L  . CATARACT EXTRACTION Right   . COLONOSCOPY  2014   Dr. Deatra Ina  . EYE SURGERY Right 02/2012   retina  . INGUINAL HERNIA REPAIR Bilateral 01/16/2013   Procedure: LAPAROSCOPIC BILATERAL INGUINAL DIRECT AND INDIRECT, FEMORAL, AND OBTURATOR HERNIAS;  Surgeon: Adin Hector, MD;  Location: [facility];  Service: General;  Laterality: Bilateral;  . INSERTION OF MESH N/A 01/16/2013   Procedure: INSERTION OF MESH;  Surgeon: Adin Hector, MD;  Location: [facility];  Service: General;  Laterality: N/A;  . LUMBAR LAMINECTOMY    . TONSILLECTOMY AND ADENOIDECTOMY    . TOTAL HIP ARTHROPLASTY  2005   L    Allergies  Allergen Reactions  . Latex Rash  . Tape Rash      Paper tape is ok to use     Allergies as of 04/13/2019      Reactions   Latex Rash   Tape Rash   Paper tape is ok to use      Medication List       Accurate as of April 13, 2019 11:59 PM. If you have any questions, ask your nurse or doctor.        STOP taking these medications   meloxicam 7.5 MG tablet Commonly known as: MOBIC Stopped by: Brantley Wiley X Kaila Devries, NP     TAKE these medications   acetaminophen 500 MG tablet Commonly known as: TYLENOL Take 1,000 mg by mouth 3 (three) times daily.   amLODipine 5 MG tablet Commonly known as: NORVASC Take 7.5 mg by mouth daily.   ARIPiprazole 2 MG  tablet Commonly known as: Abilify Take 1 tablet (2 mg total) by mouth daily.   aspirin 81 MG tablet Take 81 mg by mouth every morning.   atorvastatin 20 MG tablet Commonly known as: LIPITOR Take 20 mg by mouth daily.   B-complex with vitamin C tablet Take 1 tablet by mouth daily.   bimatoprost 0.01 % Soln Commonly known as: LUMIGAN Place 1 drop into both eyes at bedtime.   buPROPion 300 MG 24 hr tablet Commonly known as: WELLBUTRIN XL Take 300 mg by mouth daily. per psychiatry recommendation   clonazePAM 0.5 MG tablet Commonly known as: KLONOPIN Take 1 tablet (0.5 mg total) by mouth at bedtime.   erythromycin ophthalmic ointment Place 1 application into both eyes at bedtime.   feeding supplement (PRO-STAT SUGAR FREE 64) Liqd Take 30 mLs by mouth daily. To promote wound healing   ICAPS PO Take 1 tablet by mouth daily.   ketoconazole 2 % cream Commonly known as: NIZORAL Apply 1 application topically 2 (two) times daily as needed for irritation.   lactose free nutrition Liqd Take 237 mLs by mouth 2 (two) times daily between meals. For weight loss   levothyroxine 50 MCG tablet Commonly known as: SYNTHROID Take 50 mcg by mouth daily before breakfast. EXCEPT Take 1/2 tablet on Sunday mornings.   linaclotide 145 MCG Caps capsule Commonly known as: LINZESS Take 1  capsule (145 mcg total) by mouth daily before breakfast.   loratadine 10 MG tablet Commonly known as: CLARITIN Take 10 mg by mouth daily.   Melatonin 3 MG Tabs Take 1 tablet by mouth at bedtime.   mirtazapine 30 MG tablet Commonly known as: REMERON Take 60 mg by mouth at bedtime.   polyethylene glycol 17 g packet Commonly known as: MIRALAX / GLYCOLAX Take 17 g by mouth daily. Hold for loose stool   sertraline 100 MG tablet Commonly known as: ZOLOFT Take 200 mg by mouth every morning.   Systane 0.4-0.3 % Soln Generic drug: Polyethyl Glycol-Propyl Glycol Apply 1 drop to eye 4 (four) times daily.   tamsulosin 0.4 MG Caps capsule Commonly known as: FLOMAX TAKE 1 CAPSULE (0.4 MG TOTAL) BY MOUTH DAILY.   topiramate 25 MG tablet Commonly known as: TOPAMAX TAKE 1 TABLET BY MOUTH EVERY MORNING AND 2 TABLETS BY MOUTH EVERY EVENING   Vitamin D3 25 MCG (1000 UT) Caps Take 1,000 Units by mouth daily.       Review of Systems  Constitutional: Negative for activity change, appetite change, chills, diaphoresis, fatigue, fever and unexpected weight change.  HENT: Positive for hearing loss. Negative for congestion and voice change.   Eyes: Positive for visual disturbance.  Respiratory: Negative for cough and shortness of breath.   Cardiovascular: Negative for chest pain, palpitations and leg swelling.  Gastrointestinal: Negative for abdominal distention, abdominal pain, constipation, nausea and vomiting.  Genitourinary: Negative for difficulty urinating, dysuria and urgency.  Musculoskeletal: Positive for arthralgias and gait problem.  Skin: Negative for color change and pallor.  Neurological: Positive for tremors. Negative for dizziness, speech difficulty, weakness and headaches.       Memory lapses.   Psychiatric/Behavioral: Negative for agitation, behavioral problems, hallucinations and sleep disturbance. The patient is not nervous/anxious.     Immunization History    Administered Date(s) Administered  . DTaP 11/05/2005  . Hepatitis B 1932/06/23  . Influenza Split 11/12/2011, 11/29/2013  . Influenza Whole 10/30/2007, 11/12/2008, 11/26/2009  . Influenza, High Dose Seasonal PF 12/31/2015, 11/24/2016, 11/30/2018  . Influenza,inj,quad, With  Preservative 10/16/2016  . Influenza-Unspecified 11/28/2017  . Moderna SARS-COVID-2 Vaccination 02/19/2019, 03/19/2019  . PPD Test 06/23/2011  . Pneumococcal Conjugate-13 01/14/2016  . Pneumococcal Polysaccharide-23 02/16/1999  . Tdap 01/13/2017  . Varicella 11/09/2005  . Zoster 08/30/2006   Pertinent  Health Maintenance Due  Topic Date Due  . INFLUENZA VACCINE  Completed  . PNA vac Low Risk Adult  Completed   Fall Risk  10/28/2017 10/27/2016 06/09/2016 02/03/2016 10/28/2015  Falls in the past year? Yes Yes Yes Yes Yes  Number falls in past yr: 1 2 or more 2 or more 2 or more 2 or more  Comment - - - 2 falls in last 2 months -  Injury with Fall? Yes Yes No Yes No  Comment - L hip - scraps and bruises -  Risk Factor Category  - - - - -  Risk for fall due to : - - - - Impaired balance/gait  Follow up - - - - -   Functional Status Survey:    Vitals:   04/13/19 1518  BP: 122/74  Pulse: 67  Resp: 18  Temp: 97.6 F (36.4 C)  SpO2: 96%  Weight: 138 lb 4.8 oz (62.7 kg)  Height: 6' (1.829 m)   Body mass index is 18.76 kg/m. Physical Exam Vitals and nursing note reviewed.  Constitutional:      General: He is not in acute distress.    Appearance: Normal appearance. He is not ill-appearing.  HENT:     Head: Normocephalic and atraumatic.     Nose: Nose normal.     Mouth/Throat:     Mouth: Mucous membranes are moist.  Eyes:     Extraocular Movements: Extraocular movements intact.     Conjunctiva/sclera: Conjunctivae normal.     Pupils: Pupils are equal, round, and reactive to light.     Comments: Left eye blind.   Cardiovascular:     Rate and Rhythm: Normal rate and regular rhythm.     Heart sounds:  No murmur.  Pulmonary:     Effort: Pulmonary effort is normal.     Breath sounds: No wheezing or rales.  Abdominal:     General: Bowel sounds are normal. There is no distension.     Palpations: Abdomen is soft.     Tenderness: There is no abdominal tenderness. There is no guarding.  Musculoskeletal:     Cervical back: Normal range of motion and neck supple.     Right lower leg: No edema.     Left lower leg: No edema.  Skin:    General: Skin is warm and dry.  Neurological:     General: No focal deficit present.     Mental Status: He is alert. Mental status is at baseline.     Motor: No weakness.     Coordination: Coordination normal.     Gait: Gait abnormal.     Comments: Oriented to person, place. Fine, mild resting tremor in fingers, not disabling.   Psychiatric:        Mood and Affect: Mood normal.        Behavior: Behavior normal.     Labs reviewed: Recent Labs    09/11/18 0000  NA 142  K 4.1  CL 112  CO2 23  BUN 34*  CREATININE 1.3  CALCIUM 8.4   Recent Labs    09/11/18 0000  AST 18  ALT 12  ALKPHOS 44  PROT 5.8  ALBUMIN 3.5   Recent Labs  09/11/18 0000 11/24/18 0000  WBC 4.7 4.1  HGB 14.0 13.1*  HCT 42 40*  PLT 145* 140*   Lab Results  Component Value Date   TSH 2.06 11/24/2018   Lab Results  Component Value Date   HGBA1C 5 01/26/2018   Lab Results  Component Value Date   CHOL 146 11/24/2018   HDL 38 11/24/2018   LDLCALC 89 11/24/2018   LDLDIRECT 132.5 09/08/2012   TRIG 91 11/24/2018   CHOLHDL 2.7 11/15/2016    Significant Diagnostic Results in last 30 days:  No results found.  Assessment/Plan Fall  the patent was found sitting on the floor beside his bed 04/10/19, reported no complain of pain or discomfort. The patient needs close supervision/assistance for transfer, w/c for mobility.    Hypertension Blood pressure is controlled, continue Amlodipine, ASA, Atorvastatin.  Generalized osteoarthritis of multiple sites Multiple  sites, continue Tylenol, activity as tolerated.   Unsteady gait The patient needs supervision/assistance for transfer, safety, w/c for mobility.      Family/ staff Communication:  Plan of care reviewed with the patient and charge nurse.   Labs/tests ordered:  none  Time spend 25 minutes.

## 2019-04-15 ENCOUNTER — Encounter: Payer: Self-pay | Admitting: Nurse Practitioner

## 2019-04-15 NOTE — Assessment & Plan Note (Signed)
the patent was found sitting on the floor beside his bed 04/10/19, reported no complain of pain or discomfort. The patient needs close supervision/assistance for transfer, w/c for mobility.

## 2019-04-15 NOTE — Assessment & Plan Note (Signed)
The patient needs supervision/assistance for transfer, safety, w/c for mobility.

## 2019-04-15 NOTE — Assessment & Plan Note (Signed)
Multiple sites, continue Tylenol, activity as tolerated.

## 2019-04-15 NOTE — Assessment & Plan Note (Deleted)
Multiple sites, continue Tylenol, activity as tolerated.

## 2019-04-15 NOTE — Assessment & Plan Note (Addendum)
Blood pressure is controlled, continue Amlodipine, ASA, Atorvastatin.

## 2019-04-19 ENCOUNTER — Ambulatory Visit: Payer: Medicare Other | Admitting: Neurology

## 2019-04-19 ENCOUNTER — Telehealth: Payer: Self-pay | Admitting: Neurology

## 2019-04-19 NOTE — Telephone Encounter (Signed)
This patient did not show for a revisit appointment today. 

## 2019-04-24 ENCOUNTER — Encounter: Payer: Self-pay | Admitting: Internal Medicine

## 2019-04-24 ENCOUNTER — Non-Acute Institutional Stay (SKILLED_NURSING_FACILITY): Payer: Medicare Other | Admitting: Internal Medicine

## 2019-04-24 DIAGNOSIS — R634 Abnormal weight loss: Secondary | ICD-10-CM | POA: Diagnosis not present

## 2019-04-24 DIAGNOSIS — B349 Viral infection, unspecified: Secondary | ICD-10-CM | POA: Diagnosis not present

## 2019-04-24 DIAGNOSIS — J209 Acute bronchitis, unspecified: Secondary | ICD-10-CM

## 2019-04-24 DIAGNOSIS — I739 Peripheral vascular disease, unspecified: Secondary | ICD-10-CM

## 2019-04-24 NOTE — Progress Notes (Signed)
Location: [facility] Room Number: 37-A Place of Service:  SNF (31)  Provider:   Code Status: Full Code Goals of Care:  Advanced Directives 04/24/2019  Does Patient Have a Medical Advance Directive? Yes  Type of Advance Directive Out of facility DNR (pink MOST or yellow form);[place];Living will  Does patient want to make changes to medical advance directive? No - Patient declined  Copy of [place] in Chart? Yes - validated most recent copy scanned in chart (See row information)  Pre-existing out of facility DNR order (yellow form or pink MOST form) Pink MOST form placed in chart (order not valid for inpatient use)     Chief Complaint  Patient presents with  . Acute Visit    Patient is seen for wheezing    HPI: Patient is a 86 y.o. male seen today for an acute visit for Wheezing and SOB with Cough  He has h/o HTN, Hyperlipidemia, PAD, BPH , CKD stage 2, Hypothyroidism, and Major Depression Also Has h/o Cervial Stenosis with Compressive Myelopathy,   Noticed to be coughing Yesterday with Wheezing. Was started on Nebs. Says they helped but still feels tight in his Chest No Fever. No Chest Pain Does have Runny nose and feels weak   Past Medical History:  Diagnosis Date  . Acute urinary retention 01/28/2013  . AKI (acute kidney injury) ([place]) 01/26/2013  . Altered mental status 09/09/2013  . Anemia    B12 deficiency  . ANXIETY 06/07/2007   Qualifier: Diagnosis of  By: Talbert Cage CMA ([place]), June    . ARTHRITIS 06/07/2007   Qualifier: Diagnosis of  By: Talbert Cage CMA ([place]), June    . Balance problems 04/05/2011  . Benign essential tremor   . Benign fibroma of prostate 10/11/2011  . Bilateral inguinal hernia 10/09/2012  . Bilateral leg edema 09/13/2012  . Bladder retention 02/05/2013  . Bradycardia, sinus 08/02/2012  . CAROTID ARTERY DISEASE 03/31/2007   Qualifier: Diagnosis of  By: Linna Darner MD, Gwyndolyn Saxon    . Carotid stenosis     s/p L CEA  . Cataract, nuclear 03/21/2014  . 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 Cervical spinal stenosis   . Cervical stenosis of spine   . Critical lower limb ischemia   . Degeneration macular 11/02/2011  . Depression    Dr Albertine Patricia  . Difficulty in walking(719.7) 04/05/2011  . ESOPHAGITIS 08/06/2002   Qualifier: Diagnosis of  By: Talbert Cage CMA ([place]), June    . Essential and other specified forms of tremor 04/18/2013  . Fall at home 01/06/2017   left hip pain  . Falls   . Family history of colon cancer 07/24/2012  . Fecal impaction ([place]) 10/11/2012  . Femoral hernia, bilateral s/p lap repair 01/16/2013 01/16/2013  . Glaucoma, compensated 03/21/2014  . H/O cardiovascular stress test    a. 02/2003 -> no ischemia/infarct.  Marland Kitchen HAMMER TOE 04/23/2010   Qualifier: Diagnosis of  By: Linna Darner MD, Gwyndolyn Saxon    . Hematoma of leg   . Hip hematoma, right 09/09/2013  . History of shingles 2009  . HTN (hypertension) 08/22/2012  . Hx of echocardiogram    Echocardiogram 08/01/12: Mild LVH, EF 55-60%, normal wall motion.  . Hydrocele 10/11/2011  . Hyperlipidemia   . 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 Hypothyroidism   . Macular degeneration disease   . Macular edema   . Migraines    Dr Jannifer Franklin  . MORTON'S NEUROMA, LEFT 08/22/2007   Qualifier: Diagnosis of  By: Linna Darner MD, Gwyndolyn Saxon    . PAD (peripheral artery disease) ([place])   . Peripheral neuropathy   . Personal history of colonic polyps 01/05/2013   2014 2 mm adenomatous polyp   . PREMATURE ATRIAL CONTRACTIONS 03/14/2006   Qualifier: Diagnosis of  By: Linna Darner MD, Gwendolyn Lima 12/16/2011  . SPINAL STENOSIS 03/14/2006   Qualifier: Diagnosis of  By: Linna Darner MD, William   Cervical spine    . Testosterone deficiency    Dr Lawerance Bach, [facility]  . Unspecified vitamin D deficiency 08/06/2012  . Urge incontinence 10/09/2012  . Urinary tract infection, site not specified 02/13/2013  . Weakness 08/02/2012    Past Surgical History:  Procedure Laterality Date    . APPENDECTOMY  1941  . BREAST CYST EXCISION Left 01/16/2013   Procedure: MASS EXCISION AXILLA;  Surgeon: Adin Hector, MD;  Location: [facility];  Service: General;  Laterality: Left;  . CAROTID ENDARTERECTOMY  2005    L  . CATARACT EXTRACTION Right   . COLONOSCOPY  2014   Dr. Deatra Ina  . EYE SURGERY Right 02/2012   retina  . INGUINAL HERNIA REPAIR Bilateral 01/16/2013   Procedure: LAPAROSCOPIC BILATERAL INGUINAL DIRECT AND INDIRECT, FEMORAL, AND OBTURATOR HERNIAS;  Surgeon: Adin Hector, MD;  Location: [facility];  Service: General;  Laterality: Bilateral;  . INSERTION OF MESH N/A 01/16/2013   Procedure: INSERTION OF MESH;  Surgeon: Adin Hector, MD;  Location: [facility];  Service: General;  Laterality: N/A;  . LUMBAR LAMINECTOMY    . TONSILLECTOMY AND ADENOIDECTOMY    . TOTAL HIP ARTHROPLASTY  2005   L    Allergies  Allergen Reactions  . Latex Rash  . Tape Rash    Paper tape is ok to use     Outpatient Encounter Medications as of 04/24/2019  Medication Sig  . acetaminophen (TYLENOL) 500 MG tablet Take 1,000 mg by mouth 3 (three) times daily.   . Amino Acids-Protein Hydrolys (FEEDING SUPPLEMENT, PRO-STAT SUGAR FREE 64,) LIQD Take 30 mLs by mouth daily. To promote wound healing  . amLODipine (NORVASC) 5 MG tablet Take 7.5 mg by mouth daily.   . ARIPiprazole (ABILIFY) 2 MG tablet Take 1 tablet (2 mg total) by mouth daily.  Marland Kitchen aspirin 81 MG tablet Take 81 mg by mouth every morning.   Marland Kitchen atorvastatin (LIPITOR) 20 MG tablet Take 20 mg by mouth daily.  . B Complex-C (B-COMPLEX WITH VITAMIN C) tablet Take 1 tablet by mouth daily.  . bimatoprost (LUMIGAN) 0.01 % SOLN Place 1 drop into both eyes at bedtime.  Marland Kitchen buPROPion (WELLBUTRIN XL) 300 MG 24 hr tablet Take 300 mg by mouth daily. per psychiatry recommendation  . Cholecalciferol (VITAMIN D3) 1000 units CAPS Take 1,000 Units by mouth daily.   . clonazePAM (KLONOPIN) 0.5 MG tablet Take 1 tablet (0.5 mg total) by mouth at bedtime.  Marland Kitchen  dextromethorphan-guaiFENesin (ROBAFEN DM CGH/CHEST CONGEST) 10-100 MG/5ML liquid Take 10 mLs by mouth every 6 (six) hours as needed for cough.  . DM-Benzocaine-Menthol (CHLORASEPTIC TOTAL MT) Use as directed 1 lozenge in the mouth or throat every 2 (two) hours as needed.  Marland Kitchen erythromycin ophthalmic ointment Place 1 application into both eyes at bedtime.   Marland Kitchen ketoconazole (NIZORAL) 2 % cream Apply 1 application topically 2 (two) times daily as needed for irritation. Apply to buttocks.  . lactose free nutrition (BOOST) LIQD Take 237 mLs by mouth 2 (two) times daily between meals. For weight loss  .  levothyroxine (SYNTHROID, LEVOTHROID) 50 MCG tablet Take 50 mcg by mouth daily before breakfast.   . linaclotide (LINZESS) 145 MCG CAPS capsule Take 1 capsule (145 mcg total) by mouth daily before breakfast.  . loratadine (CLARITIN) 10 MG tablet Take 10 mg by mouth daily.  . Melatonin 3 MG TABS Take 1 tablet by mouth at bedtime.  . mirtazapine (REMERON) 30 MG tablet Take 60 mg by mouth at bedtime.  . Multiple Vitamins-Minerals (ICAPS PO) Take 1 tablet by mouth daily.  Vladimir Faster Glycol-Propyl Glycol (SYSTANE) 0.4-0.3 % SOLN Apply 1 drop to eye 4 (four) times daily.  . polyethylene glycol (MIRALAX / GLYCOLAX) packet Take 17 g by mouth daily. Hold for loose stool  . sertraline (ZOLOFT) 100 MG tablet Take 200 mg by mouth every morning.   . tamsulosin (FLOMAX) 0.4 MG CAPS capsule TAKE 1 CAPSULE (0.4 MG TOTAL) BY MOUTH DAILY.  Marland Kitchen topiramate (TOPAMAX) 25 MG tablet Take 25 mg by mouth 2 (two) times daily.  . [DISCONTINUED] topiramate (TOPAMAX) 25 MG tablet TAKE 1 TABLET BY MOUTH EVERY MORNING AND 2 TABLETS BY MOUTH EVERY EVENING (Patient taking differently: Take 25 mg by mouth 2 (two) times daily. )   No facility-administered encounter medications on file as of 04/24/2019.    Review of Systems:  Review of Systems  Constitutional: Positive for activity change.  HENT: Positive for congestion, postnasal drip and  rhinorrhea.   Respiratory: Positive for cough, shortness of breath and wheezing.   Cardiovascular: Negative.   Gastrointestinal: Negative.   Genitourinary: Negative.   Musculoskeletal: Negative.   Skin: Positive for color change.  Neurological: Positive for weakness.  Psychiatric/Behavioral: Negative.     Health Maintenance  Topic Date Due  . TETANUS/TDAP  01/14/2027  . INFLUENZA VACCINE  Completed  . PNA vac Low Risk Adult  Completed    Physical Exam: Vitals:   04/24/19 1501  BP: 120/68  Pulse: 90  Resp: 20  Temp: (!) 97.4 F (36.3 C)  TempSrc: Oral  SpO2: 92%  Weight: 137 lb 1.6 oz (62.2 kg)  Height: 6' (1.829 m)   Body mass index is 18.59 kg/m. Physical Exam  Constitutional: Oriented to person, place, and time. Very Frail HENT:  Head: Normocephalic.  Mouth/Throat: Oropharynx is clear and moist.  Eyes: Pupils are equal, round, and reactive to light.  Neck: Neck supple.  Cardiovascular: Normal rate and normal heart sounds.  No murmur heard. Pulmonary/Chest: Effort normal and breath sounds normal. No respiratory distress.  Has Expiratory Rales Bilateral Abdominal: Soft. Bowel sounds are normal. No distension. There is no tenderness. There is no rebound.  Musculoskeletal: Mild  edema.  But Both his LE are Cold and Red with Feeble Pulses Lymphadenopathy: none Neurological: Alert and oriented to person, place, and time.  Skin: Skin is warm and dry.  Psychiatric: Normal mood and affect. Behavior is normal. Thought content normal.   Labs reviewed: Basic Metabolic Panel: Recent Labs    09/11/18 0000 11/24/18 0000  NA 142  --   K 4.1  --   CL 112  --   CO2 23  --   BUN 34*  --   CREATININE 1.3  --   CALCIUM 8.4  --   TSH 1.77 2.06   Liver Function Tests: Recent Labs    09/11/18 0000  AST 18  ALT 12  ALKPHOS 44  PROT 5.8  ALBUMIN 3.5   No results for input(s): LIPASE, AMYLASE in the last 8760 hours. No results for input(s):  AMMONIA in the last  8760 hours. CBC: Recent Labs    09/11/18 0000 11/24/18 0000  WBC 4.7 4.1  HGB 14.0 13.1*  HCT 42 40*  PLT 145* 140*   Lipid Panel: Recent Labs    09/11/18 0000 11/24/18 0000  CHOL 151 146  HDL 39 38  LDLCALC 93 89  TRIG 99 91   Lab Results  Component Value Date   HGBA1C 5 01/26/2018    Procedures since last visit: No results found.  Assessment/Plan Viral infection with Acute Bronchitis Will start oN Prednisone Taper 40 mg over 10 days Chest Xray was Negative   PAD (peripheral artery disease) (HCC) Will order ABI  Weight loss Has stabilized on decreased dose of Topamax Also are D/W the his Psychiatrist to see about other Meds    Labs/tests ordered:    Next appt:  Visit date not found

## 2019-04-24 NOTE — Progress Notes (Deleted)
Location:  [facility] Room Number: 37-A Place of Service:  SNF [phone]) Provider:  [name], MD  Patient Care Team: [name], MD as PCP - General (Internal Medicine) [name], MD as Consulting Physician (Urology) [name], MD as Consulting Physician (Neurology) [name], MD as Consulting Physician (Dermatology) [name], MD (Inactive) as Consulting Physician (Gastroenterology) [name], MD as Consulting Physician (Orthopedic Surgery) [name], MD as Consulting Physician (Cardiology) [name], MD as Consulting Physician (Ophthalmology) [name], MD as Referring Physician (Psychiatry) [name], MD as Consulting Physician (General Surgery) [name], MD as Consulting Physician (Orthopedic Surgery) [name], [name], NP as Nurse Practitioner (Family Medicine) Mast, Man X, NP as Nurse Practitioner (Internal Medicine)  Extended Emergency Contact Information Primary Emergency Contact: Tash,[name] Address: [address]          [place], [place] [address] [place] Phone: [phone] Relation: Spouse  Code Status:  DNR Goals of care: Advanced Directive information Advanced Directives 04/13/2019  Does Patient Have a Medical Advance Directive? Yes  Type of Advance Directive Out of facility DNR (pink MOST or yellow form);[place];Living will  Does patient want to make changes to medical advance directive? No - Patient declined  Copy of [place] in Chart? Yes - validated most recent copy scanned in chart (See row information)  Pre-existing out of facility DNR order (yellow form or pink MOST form) Yellow form placed in chart (order not valid for inpatient use)     Chief Complaint  Patient presents with  . Acute Visit    Patient is seen for wheezing    HPI:  Pt is an 85 y.o. male seen today for an acute  visit for    Past Medical History:  Diagnosis Date  . Acute urinary retention 01/28/2013  . AKI (acute kidney injury) ([place]) 01/26/2013  . Altered mental status 09/09/2013  . Anemia    B12 deficiency  . ANXIETY 06/07/2007   Qualifier: Diagnosis of  By: Talbert Cage CMA ([place]), June    . ARTHRITIS 06/07/2007   Qualifier: Diagnosis of  By: Talbert Cage CMA ([place]), June    . Balance problems 04/05/2011  . Benign essential tremor   . Benign fibroma of prostate 10/11/2011  . Bilateral inguinal hernia 10/09/2012  . Bilateral leg edema 09/13/2012  . Bladder retention 02/05/2013  . Bradycardia, sinus 08/02/2012  . CAROTID ARTERY DISEASE 03/31/2007   Qualifier: Diagnosis of  By: Linna Darner MD, Gwyndolyn Saxon    . Carotid stenosis    s/p L CEA  . Cataract, nuclear 03/21/2014  . 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 Cervical spinal stenosis   . Cervical stenosis of spine   . Critical lower limb ischemia   . Degeneration macular 11/02/2011  . Depression    Dr Albertine Patricia  . Difficulty in walking(719.7) 04/05/2011  . ESOPHAGITIS 08/06/2002   Qualifier: Diagnosis of  By: Talbert Cage CMA ([place]), June    . Essential and other specified forms of tremor 04/18/2013  . Fall at home 01/06/2017   left hip pain  . Falls   . Family history of colon cancer 07/24/2012  . Fecal impaction ([place]) 10/11/2012  . Femoral hernia, bilateral s/p lap repair 01/16/2013 01/16/2013  . Glaucoma, compensated 03/21/2014  . H/O cardiovascular stress test    a. 02/2003 -> no ischemia/infarct.  Marland Kitchen HAMMER TOE 04/23/2010  Qualifier: Diagnosis of  By: Linna Darner MD, William    . Hematoma of leg   . Hip hematoma, right 09/09/2013  . History of shingles 2009  . HTN (hypertension) 08/22/2012  . Hx of echocardiogram    Echocardiogram 08/01/12: Mild LVH, EF 55-60%, normal wall motion.  . Hydrocele 10/11/2011  . Hyperlipidemia   . 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 Hypothyroidism   . Macular degeneration disease   . Macular edema   . Migraines    Dr Jannifer Franklin  . MORTON'S  NEUROMA, LEFT 08/22/2007   Qualifier: Diagnosis of  By: Linna Darner MD, Gwyndolyn Saxon    . PAD (peripheral artery disease) ([place])   . Peripheral neuropathy   . Personal history of colonic polyps 01/05/2013   2014 2 mm adenomatous polyp   . PREMATURE ATRIAL CONTRACTIONS 03/14/2006   Qualifier: Diagnosis of  By: Linna Darner MD, Gwendolyn Lima 12/16/2011  . SPINAL STENOSIS 03/14/2006   Qualifier: Diagnosis of  By: Linna Darner MD, William   Cervical spine    . Testosterone deficiency    Dr Lawerance Bach, [facility]  . Unspecified vitamin D deficiency 08/06/2012  . Urge incontinence 10/09/2012  . Urinary tract infection, site not specified 02/13/2013  . Weakness 08/02/2012   Past Surgical History:  Procedure Laterality Date  . APPENDECTOMY  1941  . BREAST CYST EXCISION Left 01/16/2013   Procedure: MASS EXCISION AXILLA;  Surgeon: Adin Hector, MD;  Location: [facility];  Service: General;  Laterality: Left;  . CAROTID ENDARTERECTOMY  2005    L  . CATARACT EXTRACTION Right   . COLONOSCOPY  2014   Dr. Deatra Ina  . EYE SURGERY Right 02/2012   retina  . INGUINAL HERNIA REPAIR Bilateral 01/16/2013   Procedure: LAPAROSCOPIC BILATERAL INGUINAL DIRECT AND INDIRECT, FEMORAL, AND OBTURATOR HERNIAS;  Surgeon: Adin Hector, MD;  Location: [facility];  Service: General;  Laterality: Bilateral;  . INSERTION OF MESH N/A 01/16/2013   Procedure: INSERTION OF MESH;  Surgeon: Adin Hector, MD;  Location: [facility];  Service: General;  Laterality: N/A;  . LUMBAR LAMINECTOMY    . TONSILLECTOMY AND ADENOIDECTOMY    . TOTAL HIP ARTHROPLASTY  2005   L    Allergies  Allergen Reactions  . Latex Rash  . Tape Rash    Paper tape is ok to use     Outpatient Encounter Medications as of 04/24/2019  Medication Sig  . acetaminophen (TYLENOL) 500 MG tablet Take 1,000 mg by mouth 3 (three) times daily.   . Amino Acids-Protein Hydrolys (FEEDING SUPPLEMENT, PRO-STAT SUGAR FREE 64,) LIQD Take 30 mLs by mouth daily. To promote wound healing  .  amLODipine (NORVASC) 5 MG tablet Take 7.5 mg by mouth daily.   . ARIPiprazole (ABILIFY) 2 MG tablet Take 1 tablet (2 mg total) by mouth daily.  Marland Kitchen aspirin 81 MG tablet Take 81 mg by mouth every morning.   Marland Kitchen atorvastatin (LIPITOR) 20 MG tablet Take 20 mg by mouth daily.  . B Complex-C (B-COMPLEX WITH VITAMIN C) tablet Take 1 tablet by mouth daily.  . bimatoprost (LUMIGAN) 0.01 % SOLN Place 1 drop into both eyes at bedtime.  Marland Kitchen buPROPion (WELLBUTRIN XL) 300 MG 24 hr tablet Take 300 mg by mouth daily. per psychiatry recommendation  . Cholecalciferol (VITAMIN D3) 1000 units CAPS Take 1,000 Units by mouth daily.   . clonazePAM (KLONOPIN) 0.5 MG tablet Take 1 tablet (0.5 mg total) by mouth at bedtime.  Marland Kitchen dextromethorphan-guaiFENesin (ROBAFEN DM CGH/CHEST CONGEST)  10-100 MG/5ML liquid Take 10 mLs by mouth every 6 (six) hours as needed for cough.  . DM-Benzocaine-Menthol (CHLORASEPTIC TOTAL MT) Use as directed 1 lozenge in the mouth or throat every 2 (two) hours as needed.  Marland Kitchen erythromycin ophthalmic ointment Place 1 application into both eyes at bedtime.   Marland Kitchen ketoconazole (NIZORAL) 2 % cream Apply 1 application topically 2 (two) times daily as needed for irritation. Apply to buttocks.  . lactose free nutrition (BOOST) LIQD Take 237 mLs by mouth 2 (two) times daily between meals. For weight loss  . levothyroxine (SYNTHROID, LEVOTHROID) 50 MCG tablet Take 50 mcg by mouth daily before breakfast.   . linaclotide (LINZESS) 145 MCG CAPS capsule Take 1 capsule (145 mcg total) by mouth daily before breakfast.  . loratadine (CLARITIN) 10 MG tablet Take 10 mg by mouth daily.  . Melatonin 3 MG TABS Take 1 tablet by mouth at bedtime.  . mirtazapine (REMERON) 30 MG tablet Take 60 mg by mouth at bedtime.  . Multiple Vitamins-Minerals (ICAPS PO) Take 1 tablet by mouth daily.  Vladimir Faster Glycol-Propyl Glycol (SYSTANE) 0.4-0.3 % SOLN Apply 1 drop to eye 4 (four) times daily.  . polyethylene glycol (MIRALAX / GLYCOLAX)  packet Take 17 g by mouth daily. Hold for loose stool  . sertraline (ZOLOFT) 100 MG tablet Take 200 mg by mouth every morning.   . tamsulosin (FLOMAX) 0.4 MG CAPS capsule TAKE 1 CAPSULE (0.4 MG TOTAL) BY MOUTH DAILY.  Marland Kitchen topiramate (TOPAMAX) 25 MG tablet Take 25 mg by mouth 2 (two) times daily.  . [DISCONTINUED] topiramate (TOPAMAX) 25 MG tablet TAKE 1 TABLET BY MOUTH EVERY MORNING AND 2 TABLETS BY MOUTH EVERY EVENING (Patient taking differently: Take 25 mg by mouth 2 (two) times daily. )   No facility-administered encounter medications on file as of 04/24/2019.    Review of Systems  Immunization History  Administered Date(s) Administered  . DTaP 11/05/2005  . Hepatitis B [birth year]  . Influenza Split 11/12/2011, 11/29/2013  . Influenza Whole 10/30/2007, 11/12/2008, 11/26/2009  . Influenza, High Dose Seasonal PF 12/31/2015, 11/24/2016, 11/30/2018  . Influenza,inj,quad, With Preservative 10/16/2016  . Influenza-Unspecified 11/28/2017  . Moderna SARS-COVID-2 Vaccination 02/19/2019, 03/19/2019  . PPD Test 06/23/2011  . Pneumococcal Conjugate-13 01/14/2016  . Pneumococcal Polysaccharide-23 02/16/1999  . Tdap 01/13/2017  . Varicella 11/09/2005  . Zoster 08/30/2006   Pertinent  Health Maintenance Due  Topic Date Due  . INFLUENZA VACCINE  Completed  . PNA vac Low Risk Adult  Completed   Fall Risk  10/28/2017 10/27/2016 06/09/2016 02/03/2016 10/28/2015  Falls in the past year? Yes Yes Yes Yes Yes  Number falls in past yr: 1 2 or more 2 or more 2 or more 2 or more  Comment - - - 2 falls in last 2 months -  Injury with Fall? Yes Yes No Yes No  Comment - L hip - scraps and bruises -  Risk Factor Category  - - - - -  Risk for fall due to : - - - - Impaired balance/gait  Follow up - - - - -   Functional Status Survey:    Vitals:   04/24/19 1501  BP: 120/68  Pulse: 90  Resp: 20  Temp: (!) 97.4 F (36.3 C)  TempSrc: Oral  SpO2: 92%  Weight: 137 lb 1.6 oz (62.2 kg)  Height: 6' (1.829  m)   Body mass index is 18.59 kg/m. Physical Exam  Labs reviewed: Recent Labs    09/11/18 0000  NA 142  K 4.1  CL 112  CO2 23  BUN 34*  CREATININE 1.3  CALCIUM 8.4   Recent Labs    09/11/18 0000  AST 18  ALT 12  ALKPHOS 44  PROT 5.8  ALBUMIN 3.5   Recent Labs    09/11/18 0000 11/24/18 0000  WBC 4.7 4.1  HGB 14.0 13.1*  HCT 42 40*  PLT 145* 140*   Lab Results  Component Value Date   TSH 2.06 11/24/2018   Lab Results  Component Value Date   HGBA1C 5 01/26/2018   Lab Results  Component Value Date   CHOL 146 11/24/2018   HDL 38 11/24/2018   LDLCALC 89 11/24/2018   LDLDIRECT 132.5 09/08/2012   TRIG 91 11/24/2018   CHOLHDL 2.7 11/15/2016    Significant Diagnostic Results in last 30 days:  No results found.  Assessment/Plan There are no diagnoses linked to this encounter.   Family/ staff Communication: ***  Labs/tests ordered:  ***

## 2019-04-26 ENCOUNTER — Encounter: Payer: Self-pay | Admitting: Nurse Practitioner

## 2019-04-26 ENCOUNTER — Encounter (HOSPITAL_COMMUNITY): Payer: Self-pay | Admitting: Emergency Medicine

## 2019-04-26 ENCOUNTER — Other Ambulatory Visit: Payer: Self-pay

## 2019-04-26 ENCOUNTER — Observation Stay (HOSPITAL_COMMUNITY): Payer: Medicare Other

## 2019-04-26 ENCOUNTER — Emergency Department (HOSPITAL_COMMUNITY): Payer: Medicare Other

## 2019-04-26 ENCOUNTER — Observation Stay (HOSPITAL_COMMUNITY)
Admission: EM | Admit: 2019-04-26 | Discharge: 2019-04-27 | Disposition: A | Discharge status: Skilled Nursing Facility | Payer: Medicare Other | Attending: Internal Medicine | Admitting: Internal Medicine

## 2019-04-26 DIAGNOSIS — Z7982 Long term (current) use of aspirin: Secondary | ICD-10-CM | POA: Insufficient documentation

## 2019-04-26 DIAGNOSIS — H42 Glaucoma in diseases classified elsewhere: Secondary | ICD-10-CM | POA: Insufficient documentation

## 2019-04-26 DIAGNOSIS — R001 Bradycardia, unspecified: Secondary | ICD-10-CM | POA: Diagnosis present

## 2019-04-26 DIAGNOSIS — Z841 Family history of disorders of kidney and ureter: Secondary | ICD-10-CM | POA: Insufficient documentation

## 2019-04-26 DIAGNOSIS — E1139 Type 2 diabetes mellitus with other diabetic ophthalmic complication: Secondary | ICD-10-CM | POA: Diagnosis not present

## 2019-04-26 DIAGNOSIS — D696 Thrombocytopenia, unspecified: Secondary | ICD-10-CM | POA: Insufficient documentation

## 2019-04-26 DIAGNOSIS — D72819 Decreased white blood cell count, unspecified: Secondary | ICD-10-CM | POA: Diagnosis not present

## 2019-04-26 DIAGNOSIS — G250 Essential tremor: Secondary | ICD-10-CM | POA: Insufficient documentation

## 2019-04-26 DIAGNOSIS — J470 Bronchiectasis with acute lower respiratory infection: Secondary | ICD-10-CM | POA: Insufficient documentation

## 2019-04-26 DIAGNOSIS — R0602 Shortness of breath: Secondary | ICD-10-CM | POA: Insufficient documentation

## 2019-04-26 DIAGNOSIS — Z20822 Contact with and (suspected) exposure to covid-19: Secondary | ICD-10-CM | POA: Diagnosis present

## 2019-04-26 DIAGNOSIS — L8990 Pressure ulcer of unspecified site, unspecified stage: Secondary | ICD-10-CM | POA: Insufficient documentation

## 2019-04-26 DIAGNOSIS — M1990 Unspecified osteoarthritis, unspecified site: Secondary | ICD-10-CM | POA: Insufficient documentation

## 2019-04-26 DIAGNOSIS — E1122 Type 2 diabetes mellitus with diabetic chronic kidney disease: Secondary | ICD-10-CM | POA: Diagnosis not present

## 2019-04-26 DIAGNOSIS — J90 Pleural effusion, not elsewhere classified: Secondary | ICD-10-CM | POA: Insufficient documentation

## 2019-04-26 DIAGNOSIS — I129 Hypertensive chronic kidney disease with stage 1 through stage 4 chronic kidney disease, or unspecified chronic kidney disease: Secondary | ICD-10-CM | POA: Insufficient documentation

## 2019-04-26 DIAGNOSIS — I10 Essential (primary) hypertension: Secondary | ICD-10-CM | POA: Diagnosis not present

## 2019-04-26 DIAGNOSIS — N183 Chronic kidney disease, stage 3 unspecified: Secondary | ICD-10-CM | POA: Diagnosis not present

## 2019-04-26 DIAGNOSIS — E039 Hypothyroidism, unspecified: Secondary | ICD-10-CM | POA: Diagnosis present

## 2019-04-26 DIAGNOSIS — F0390 Unspecified dementia without behavioral disturbance: Secondary | ICD-10-CM | POA: Insufficient documentation

## 2019-04-26 DIAGNOSIS — M4802 Spinal stenosis, cervical region: Secondary | ICD-10-CM | POA: Diagnosis present

## 2019-04-26 DIAGNOSIS — G992 Myelopathy in diseases classified elsewhere: Secondary | ICD-10-CM | POA: Insufficient documentation

## 2019-04-26 DIAGNOSIS — I739 Peripheral vascular disease, unspecified: Secondary | ICD-10-CM | POA: Diagnosis present

## 2019-04-26 DIAGNOSIS — F329 Major depressive disorder, single episode, unspecified: Secondary | ICD-10-CM | POA: Insufficient documentation

## 2019-04-26 DIAGNOSIS — Z87891 Personal history of nicotine dependence: Secondary | ICD-10-CM | POA: Insufficient documentation

## 2019-04-26 DIAGNOSIS — E785 Hyperlipidemia, unspecified: Secondary | ICD-10-CM | POA: Insufficient documentation

## 2019-04-26 DIAGNOSIS — N1831 Chronic kidney disease, stage 3a: Secondary | ICD-10-CM | POA: Diagnosis present

## 2019-04-26 DIAGNOSIS — F419 Anxiety disorder, unspecified: Secondary | ICD-10-CM | POA: Diagnosis not present

## 2019-04-26 DIAGNOSIS — J189 Pneumonia, unspecified organism: Secondary | ICD-10-CM | POA: Diagnosis not present

## 2019-04-26 DIAGNOSIS — I2510 Atherosclerotic heart disease of native coronary artery without angina pectoris: Secondary | ICD-10-CM | POA: Insufficient documentation

## 2019-04-26 DIAGNOSIS — Z7952 Long term (current) use of systemic steroids: Secondary | ICD-10-CM | POA: Insufficient documentation

## 2019-04-26 DIAGNOSIS — Z91048 Other nonmedicinal substance allergy status: Secondary | ICD-10-CM | POA: Insufficient documentation

## 2019-04-26 DIAGNOSIS — Z800 Family history of malignant neoplasm of digestive organs: Secondary | ICD-10-CM | POA: Insufficient documentation

## 2019-04-26 DIAGNOSIS — Z8249 Family history of ischemic heart disease and other diseases of the circulatory system: Secondary | ICD-10-CM | POA: Insufficient documentation

## 2019-04-26 DIAGNOSIS — E782 Mixed hyperlipidemia: Secondary | ICD-10-CM | POA: Diagnosis present

## 2019-04-26 DIAGNOSIS — Z820 Family history of epilepsy and other diseases of the nervous system: Secondary | ICD-10-CM | POA: Insufficient documentation

## 2019-04-26 DIAGNOSIS — Z833 Family history of diabetes mellitus: Secondary | ICD-10-CM | POA: Insufficient documentation

## 2019-04-26 DIAGNOSIS — Z91040 Latex allergy status: Secondary | ICD-10-CM | POA: Insufficient documentation

## 2019-04-26 DIAGNOSIS — Z823 Family history of stroke: Secondary | ICD-10-CM | POA: Insufficient documentation

## 2019-04-26 DIAGNOSIS — Z79899 Other long term (current) drug therapy: Secondary | ICD-10-CM | POA: Insufficient documentation

## 2019-04-26 DIAGNOSIS — R5381 Other malaise: Secondary | ICD-10-CM | POA: Insufficient documentation

## 2019-04-26 DIAGNOSIS — R269 Unspecified abnormalities of gait and mobility: Secondary | ICD-10-CM | POA: Insufficient documentation

## 2019-04-26 LAB — SAVE SMEAR(SSMR), FOR PROVIDER SLIDE REVIEW

## 2019-04-26 LAB — CBC WITH DIFFERENTIAL/PLATELET
Abs Immature Granulocytes: 0.2 10*3/uL — ABNORMAL HIGH (ref 0.00–0.07)
Abs Immature Granulocytes: 0.01 10*3/uL (ref 0.00–0.07)
Band Neutrophils: 20 %
Basophils Absolute: 0 10*3/uL (ref 0.0–0.1)
Basophils Absolute: 0 10*3/uL (ref 0.0–0.1)
Basophils Relative: 0 %
Basophils Relative: 0 %
Eosinophils Absolute: 0.1 10*3/uL (ref 0.0–0.5)
Eosinophils Absolute: 0 10*3/uL (ref 0.0–0.5)
Eosinophils Relative: 2 %
Eosinophils Relative: 1 %
HCT: 50 % (ref 39.0–52.0)
HCT: 38.4 % — ABNORMAL LOW (ref 39.0–52.0)
Hemoglobin: 15.5 g/dL (ref 13.0–17.0)
Hemoglobin: 11.9 g/dL — ABNORMAL LOW (ref 13.0–17.0)
Immature Granulocytes: 0 %
Lymphocytes Relative: 11 %
Lymphocytes Relative: 18 %
Lymphs Abs: 0.4 10*3/uL — ABNORMAL LOW (ref 0.7–4.0)
Lymphs Abs: 0.5 10*3/uL — ABNORMAL LOW (ref 0.7–4.0)
MCH: 30 pg (ref 26.0–34.0)
MCH: 30.2 pg (ref 26.0–34.0)
MCHC: 31 g/dL (ref 30.0–36.0)
MCHC: 31 g/dL (ref 30.0–36.0)
MCV: 96.9 fL (ref 80.0–100.0)
MCV: 97.5 fL (ref 80.0–100.0)
Metamyelocytes Relative: 7 %
Monocytes Absolute: 0.3 10*3/uL (ref 0.1–1.0)
Monocytes Absolute: 0.3 10*3/uL (ref 0.1–1.0)
Monocytes Relative: 9 %
Monocytes Relative: 12 %
Neutro Abs: 2.3 10*3/uL (ref 1.7–7.7)
Neutro Abs: 1.9 10*3/uL (ref 1.7–7.7)
Neutrophils Relative %: 51 %
Neutrophils Relative %: 69 %
Platelets: 139 10*3/uL — ABNORMAL LOW (ref 150–400)
Platelets: 123 10*3/uL — ABNORMAL LOW (ref 150–400)
RBC: 5.16 MIL/uL (ref 4.22–5.81)
RBC: 3.94 MIL/uL — ABNORMAL LOW (ref 4.22–5.81)
RDW: 14.4 % (ref 11.5–15.5)
RDW: 14.2 % (ref 11.5–15.5)
WBC: 3.2 10*3/uL — ABNORMAL LOW (ref 4.0–10.5)
WBC: 2.8 10*3/uL — ABNORMAL LOW (ref 4.0–10.5)
nRBC: 0 % (ref 0.0–0.2)
nRBC: 0 % (ref 0.0–0.2)

## 2019-04-26 LAB — COMPREHENSIVE METABOLIC PANEL
ALT: 20 U/L (ref 0–44)
AST: 24 U/L (ref 15–41)
Albumin: 2.8 g/dL — ABNORMAL LOW (ref 3.5–5.0)
Alkaline Phosphatase: 36 U/L — ABNORMAL LOW (ref 38–126)
Anion gap: 6 (ref 5–15)
BUN: 38 mg/dL — ABNORMAL HIGH (ref 8–23)
CO2: 23 mmol/L (ref 22–32)
Calcium: 8.2 mg/dL — ABNORMAL LOW (ref 8.9–10.3)
Chloride: 114 mmol/L — ABNORMAL HIGH (ref 98–111)
Creatinine, Ser: 1.04 mg/dL (ref 0.61–1.24)
GFR calc Af Amer: >60 mL/min (ref >60)
GFR calc non Af Amer: >60 mL/min (ref >60)
Glucose, Bld: 114 mg/dL — ABNORMAL HIGH (ref 70–99)
Potassium: 3.4 mmol/L — ABNORMAL LOW (ref 3.5–5.1)
Sodium: 143 mmol/L (ref 135–145)
Total Bilirubin: 0.6 mg/dL (ref 0.3–1.2)
Total Protein: 5.4 g/dL — ABNORMAL LOW (ref 6.5–8.1)

## 2019-04-26 LAB — BRAIN NATRIURETIC PEPTIDE
B Natriuretic Peptide: 119.5 pg/mL — ABNORMAL HIGH (ref 0.0–100.0)
B Natriuretic Peptide: 145.8 pg/mL — ABNORMAL HIGH (ref 0.0–100.0)

## 2019-04-26 LAB — TROPONIN I (HIGH SENSITIVITY)
Troponin I (High Sensitivity): 29 ng/L — ABNORMAL HIGH (ref <18)
Troponin I (High Sensitivity): 29 ng/L — ABNORMAL HIGH (ref <18)

## 2019-04-26 LAB — I-STAT CHEM 8, ED
BUN: 36 mg/dL — ABNORMAL HIGH (ref 8–23)
Calcium, Ion: 1.27 mmol/L (ref 1.15–1.40)
Chloride: 109 mmol/L (ref 98–111)
Creatinine, Ser: 1.1 mg/dL (ref 0.61–1.24)
Glucose, Bld: 115 mg/dL — ABNORMAL HIGH (ref 70–99)
HCT: 42 % (ref 39.0–52.0)
Hemoglobin: 14.3 g/dL (ref 13.0–17.0)
Potassium: 3.7 mmol/L (ref 3.5–5.1)
Sodium: 143 mmol/L (ref 135–145)
TCO2: 24 mmol/L (ref 22–32)

## 2019-04-26 LAB — TSH: TSH: 0.994 u[IU]/mL (ref 0.350–4.500)

## 2019-04-26 LAB — SARS CORONAVIRUS 2 (TAT 6-24 HRS): SARS Coronavirus 2: NEGATIVE

## 2019-04-26 LAB — POC SARS CORONAVIRUS 2 AG -  ED: SARS Coronavirus 2 Ag: NEGATIVE

## 2019-04-26 LAB — D-DIMER, QUANTITATIVE: D-Dimer, Quant: 0.84 ug/mL-FEU — ABNORMAL HIGH (ref 0.00–0.50)

## 2019-04-26 MED ORDER — SODIUM CHLORIDE 0.9 % IV SOLN
500.0000 mg | Freq: Once | INTRAVENOUS | Status: AC
  Administered 2019-04-26: 500 mg via INTRAVENOUS
  Filled 2019-04-26: qty 500

## 2019-04-26 MED ORDER — MIRTAZAPINE 15 MG PO TABS
60.0000 mg | ORAL_TABLET | Freq: Every day | ORAL | Status: DC
  Administered 2019-04-26: 60 mg via ORAL
  Filled 2019-04-26: qty 4

## 2019-04-26 MED ORDER — SODIUM CHLORIDE 0.9 % IV SOLN
2.0000 g | INTRAVENOUS | Status: DC
  Administered 2019-04-26: 2 g via INTRAVENOUS
  Filled 2019-04-26 (×2): qty 20
  Filled 2019-04-26: qty 2

## 2019-04-26 MED ORDER — MELATONIN 3 MG PO TABS
3.0000 mg | ORAL_TABLET | Freq: Every day | ORAL | Status: DC
  Administered 2019-04-26: 3 mg via ORAL
  Filled 2019-04-26: qty 1

## 2019-04-26 MED ORDER — SERTRALINE HCL 100 MG PO TABS
200.0000 mg | ORAL_TABLET | Freq: Every day | ORAL | Status: DC
  Administered 2019-04-26 – 2019-04-27 (×2): 200 mg via ORAL
  Filled 2019-04-26: qty 4
  Filled 2019-04-26: qty 2

## 2019-04-26 MED ORDER — LATANOPROST 0.005 % OP SOLN
1.0000 [drp] | Freq: Every day | OPHTHALMIC | Status: DC
  Administered 2019-04-26: 1 [drp] via OPHTHALMIC
  Filled 2019-04-26 (×2): qty 2.5

## 2019-04-26 MED ORDER — SODIUM CHLORIDE 0.9 % IV SOLN
1.0000 g | Freq: Once | INTRAVENOUS | Status: AC
  Administered 2019-04-26: 1 g via INTRAVENOUS
  Filled 2019-04-26: qty 10

## 2019-04-26 MED ORDER — BUPROPION HCL ER (XL) 300 MG PO TB24
300.0000 mg | ORAL_TABLET | Freq: Every day | ORAL | Status: DC
  Administered 2019-04-26 – 2019-04-27 (×2): 300 mg via ORAL
  Filled 2019-04-26: qty 1
  Filled 2019-04-26: qty 2

## 2019-04-26 MED ORDER — LEVOTHYROXINE SODIUM 25 MCG PO TABS: 25.0000 ug | ORAL_TABLET | ORAL | Status: DC

## 2019-04-26 MED ORDER — ONDANSETRON HCL 4 MG/2ML IJ SOLN: 4.0000 mg | Freq: Four times a day (QID) | INTRAMUSCULAR | Status: DC | PRN

## 2019-04-26 MED ORDER — LEVOTHYROXINE SODIUM 50 MCG PO TABS
50.0000 ug | ORAL_TABLET | ORAL | Status: DC
  Administered 2019-04-26: 50 ug via ORAL
  Filled 2019-04-26: qty 2

## 2019-04-26 MED ORDER — IOHEXOL 350 MG/ML SOLN
100.0000 mL | Freq: Once | INTRAVENOUS | Status: AC | PRN
  Administered 2019-04-26: 100 mL via INTRAVENOUS

## 2019-04-26 MED ORDER — LORATADINE 10 MG PO TABS
10.0000 mg | ORAL_TABLET | Freq: Every day | ORAL | Status: DC
  Administered 2019-04-26 – 2019-04-27 (×2): 10 mg via ORAL
  Filled 2019-04-26 (×2): qty 1

## 2019-04-26 MED ORDER — ATORVASTATIN CALCIUM 10 MG PO TABS
20.0000 mg | ORAL_TABLET | Freq: Every day | ORAL | Status: DC
  Administered 2019-04-26 – 2019-04-27 (×2): 20 mg via ORAL
  Filled 2019-04-26: qty 1
  Filled 2019-04-26: qty 2

## 2019-04-26 MED ORDER — LINACLOTIDE 145 MCG PO CAPS
145.0000 ug | ORAL_CAPSULE | Freq: Every day | ORAL | Status: DC
  Administered 2019-04-26 – 2019-04-27 (×2): 145 ug via ORAL
  Filled 2019-04-26 (×3): qty 1

## 2019-04-26 MED ORDER — TAMSULOSIN HCL 0.4 MG PO CAPS
0.4000 mg | ORAL_CAPSULE | Freq: Every day | ORAL | Status: DC
  Administered 2019-04-26: 0.4 mg via ORAL
  Filled 2019-04-26 (×2): qty 1

## 2019-04-26 MED ORDER — ACETAMINOPHEN 650 MG RE SUPP: 650.0000 mg | Freq: Four times a day (QID) | RECTAL | Status: DC | PRN

## 2019-04-26 MED ORDER — ARIPIPRAZOLE 2 MG PO TABS
2.0000 mg | ORAL_TABLET | Freq: Every day | ORAL | Status: DC
  Administered 2019-04-26 – 2019-04-27 (×2): 2 mg via ORAL
  Filled 2019-04-26 (×2): qty 1

## 2019-04-26 MED ORDER — POLYETHYLENE GLYCOL 3350 17 G PO PACK
17.0000 g | PACK | Freq: Every day | ORAL | Status: DC
  Administered 2019-04-27: 17 g via ORAL
  Filled 2019-04-26: qty 1

## 2019-04-26 MED ORDER — ENOXAPARIN SODIUM 40 MG/0.4ML ~~LOC~~ SOLN
40.0000 mg | SUBCUTANEOUS | Status: DC
  Administered 2019-04-26 – 2019-04-27 (×2): 40 mg via SUBCUTANEOUS
  Filled 2019-04-26 (×2): qty 0.4

## 2019-04-26 MED ORDER — ACETAMINOPHEN 325 MG PO TABS
650.0000 mg | ORAL_TABLET | Freq: Four times a day (QID) | ORAL | Status: DC | PRN
  Administered 2019-04-26: 650 mg via ORAL
  Filled 2019-04-26: qty 2

## 2019-04-26 MED ORDER — POTASSIUM CHLORIDE CRYS ER 20 MEQ PO TBCR
40.0000 meq | EXTENDED_RELEASE_TABLET | Freq: Once | ORAL | Status: AC
  Administered 2019-04-26: 40 meq via ORAL
  Filled 2019-04-26: qty 2

## 2019-04-26 MED ORDER — TOPIRAMATE 25 MG PO TABS
25.0000 mg | ORAL_TABLET | Freq: Two times a day (BID) | ORAL | Status: DC
  Administered 2019-04-26 – 2019-04-27 (×3): 25 mg via ORAL
  Filled 2019-04-26 (×3): qty 1

## 2019-04-26 MED ORDER — SODIUM CHLORIDE (PF) 0.9 % IJ SOLN
INTRAMUSCULAR | Status: AC
  Filled 2019-04-26: qty 50

## 2019-04-26 MED ORDER — ONDANSETRON HCL 4 MG PO TABS: 4.0000 mg | ORAL_TABLET | Freq: Four times a day (QID) | ORAL | Status: DC | PRN

## 2019-04-26 MED ORDER — SODIUM CHLORIDE 0.9 % IV SOLN
500.0000 mg | INTRAVENOUS | Status: DC
  Administered 2019-04-26: 500 mg via INTRAVENOUS
  Filled 2019-04-26 (×3): qty 500

## 2019-04-26 MED ORDER — ASPIRIN EC 81 MG PO TBEC
81.0000 mg | DELAYED_RELEASE_TABLET | Freq: Every morning | ORAL | Status: DC
  Administered 2019-04-26 – 2019-04-27 (×2): 81 mg via ORAL
  Filled 2019-04-26 (×2): qty 1

## 2019-04-26 MED ORDER — AMLODIPINE BESYLATE 5 MG PO TABS
2.5000 mg | ORAL_TABLET | Freq: Every day | ORAL | Status: DC
  Administered 2019-04-26 – 2019-04-27 (×2): 2.5 mg via ORAL
  Filled 2019-04-26 (×2): qty 1

## 2019-04-26 MED ORDER — CLONAZEPAM 0.5 MG PO TABS
0.5000 mg | ORAL_TABLET | Freq: Every day | ORAL | Status: DC
  Administered 2019-04-26: 0.5 mg via ORAL
  Filled 2019-04-26: qty 1

## 2019-04-26 NOTE — ED Notes (Signed)
Pt was moved to room 30 for holding until bed available upstairs. When pt was moved to hospital bed, gown was changed and new brief applied. Pt was warm to the touch. Rectal temp was obtained and found to be 100.9. Pt has no complaints at this time.

## 2019-04-26 NOTE — ED Notes (Signed)
Pt took all PO meds with applesauce. Tolerated well. No complaints.

## 2019-04-26 NOTE — ED Notes (Signed)
Notified EDP,[place],MD. POC Covid test negative.

## 2019-04-26 NOTE — Evaluation (Signed)
Clinical/Bedside Swallow Evaluation Patient Details  Name: Jose Leach MRN: [id number] Date of Birth: 1932/12/09  Today's Date: 04/26/2019 Time: SLP Start Time (ACUTE ONLY): 1438 SLP Stop Time (ACUTE ONLY): 1448 SLP Time Calculation (min) (ACUTE ONLY): 10 min  Past Medical History:  Past Medical History:  Diagnosis Date  . Acute urinary retention 01/28/2013  . AKI (acute kidney injury) ([place]) 01/26/2013  . Altered mental status 09/09/2013  . Anemia    B12 deficiency  . ANXIETY 06/07/2007   Qualifier: Diagnosis of  By: Talbert Cage CMA ([place]), June    . ARTHRITIS 06/07/2007   Qualifier: Diagnosis of  By: Talbert Cage CMA ([place]), June    . Balance problems 04/05/2011  . Benign essential tremor   . Benign fibroma of prostate 10/11/2011  . Bilateral inguinal hernia 10/09/2012  . Bilateral leg edema 09/13/2012  . Bladder retention 02/05/2013  . Bradycardia, sinus 08/02/2012  . CAROTID ARTERY DISEASE 03/31/2007   Qualifier: Diagnosis of  By: Linna Darner MD, Gwyndolyn Saxon    . Carotid stenosis    s/p L CEA  . Cataract, nuclear 03/21/2014  . 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 Cervical spinal stenosis   . Cervical stenosis of spine   . Critical lower limb ischemia   . Degeneration macular 11/02/2011  . Depression    Dr Albertine Patricia  . Difficulty in walking(719.7) 04/05/2011  . ESOPHAGITIS 08/06/2002   Qualifier: Diagnosis of  By: Talbert Cage CMA ([place]), June    . Essential and other specified forms of tremor 04/18/2013  . Fall at home 01/06/2017   left hip pain  . Falls   . Family history of colon cancer 07/24/2012  . Fecal impaction ([place]) 10/11/2012  . Femoral hernia, bilateral s/p lap repair 01/16/2013 01/16/2013  . Glaucoma, compensated 03/21/2014  . H/O cardiovascular stress test    a. 02/2003 -> no ischemia/infarct.  Marland Kitchen HAMMER TOE 04/23/2010   Qualifier: Diagnosis of  By: Linna Darner MD, Gwyndolyn Saxon    . Hematoma of leg   . Hip hematoma, right 09/09/2013  . History of shingles 2009  . HTN (hypertension) 08/22/2012  . Hx  of echocardiogram    Echocardiogram 08/01/12: Mild LVH, EF 55-60%, normal wall motion.  . Hydrocele 10/11/2011  . Hyperlipidemia   . 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 Hypothyroidism   . Macular degeneration disease   . Macular edema   . Migraines    Dr Jannifer Franklin  . MORTON'S NEUROMA, LEFT 08/22/2007   Qualifier: Diagnosis of  By: Linna Darner MD, Gwyndolyn Saxon    . PAD (peripheral artery disease) ([place])   . Peripheral neuropathy   . Personal history of colonic polyps 01/05/2013   2014 2 mm adenomatous polyp   . PREMATURE ATRIAL CONTRACTIONS 03/14/2006   Qualifier: Diagnosis of  By: Linna Darner MD, Gwendolyn Lima 12/16/2011  . SPINAL STENOSIS 03/14/2006   Qualifier: Diagnosis of  By: Linna Darner MD, William   Cervical spine    . Testosterone deficiency    Dr Lawerance Bach, [facility]  . Unspecified vitamin D deficiency 08/06/2012  . Urge incontinence 10/09/2012  . Urinary tract infection, site not specified 02/13/2013  . Weakness 08/02/2012   Past Surgical History:  Past Surgical History:  Procedure Laterality Date  . APPENDECTOMY  1941  . BREAST CYST EXCISION Left 01/16/2013   Procedure: MASS EXCISION AXILLA;  Surgeon: Adin Hector, MD;  Location: [place];  Service: General;  Laterality: Left;  . CAROTID ENDARTERECTOMY  2005    L  . CATARACT EXTRACTION Right   .  COLONOSCOPY  2014   Dr. Deatra Ina  . EYE SURGERY Right 02/2012   retina  . INGUINAL HERNIA REPAIR Bilateral 01/16/2013   Procedure: LAPAROSCOPIC BILATERAL INGUINAL DIRECT AND INDIRECT, FEMORAL, AND OBTURATOR HERNIAS;  Surgeon: Adin Hector, MD;  Location: [place];  Service: General;  Laterality: Bilateral;  . INSERTION OF MESH N/A 01/16/2013   Procedure: INSERTION OF MESH;  Surgeon: Adin Hector, MD;  Location: [place];  Service: General;  Laterality: N/A;  . LUMBAR LAMINECTOMY    . TONSILLECTOMY AND ADENOIDECTOMY    . TOTAL HIP ARTHROPLASTY  2005   L   HPI:  86 y.o. male from [facility] with history of gait disorder, essential  tremor, hypertension, hyperlipidemia, history of bradycardia with cervical myelopathy admitted with SOB.  Dx CAP.  COVID test negative   Assessment / Plan / Recommendation Clinical Impression  Pt presents with functional swallowing with no indications of dysphagia.  Oral mechanism exam was normal with no focal deficits.  Pt demonstrated adequate mastication of solids, brisk swallow response, no oral residue post-swallow, and no s/s of aspiration.  Recommend continuing current regular diet with thin liquids.  Meds whole with liquid.  No SLP f/u is needed.  D/W RN.  SLP Visit Diagnosis: Dysphagia, unspecified (R13.10)    Aspiration Risk  No limitations    Diet Recommendation   regular solids, thin liquids  Medication Administration: Whole meds with liquid    Other  Recommendations Oral Care Recommendations: Oral care BID   Follow up Recommendations None      Frequency and Duration            Prognosis        Swallow Study   General HPI: 86 y.o. male from [facility] with history of gait disorder, essential tremor, hypertension, hyperlipidemia, history of bradycardia with cervical myelopathy admitted with SOB.  Dx CAP.  COVID test negative Type of Study: Bedside Swallow Evaluation Previous Swallow Assessment: no Diet Prior to this Study: Regular;Thin liquids Temperature Spikes Noted: No Respiratory Status: Room air History of Recent Intubation: No Behavior/Cognition: Alert;Cooperative Oral Cavity Assessment: Within Functional Limits Oral Care Completed by SLP: No Oral Cavity - Dentition: Adequate natural dentition Vision: Functional for self-feeding Self-Feeding Abilities: Able to feed self Patient Positioning: Upright in bed Baseline Vocal Quality: Normal Volitional Cough: Strong Volitional Swallow: Able to elicit    Oral/Motor/Sensory Function Overall Oral Motor/Sensory Function: Within functional limits   Ice Chips Ice chips: Within functional limits   Thin  Liquid Thin Liquid: Within functional limits    Nectar Thick Nectar Thick Liquid: Not tested   Honey Thick Honey Thick Liquid: Not tested   Puree Puree: Within functional limits   Solid     Solid: Within functional limits      Juan Quam Laurice 04/26/2019,2:52 PM  Estill Bamberg L. Tivis Ringer, [place] Office number [phone] Pager [phone]

## 2019-04-26 NOTE — Progress Notes (Signed)
Pt admitted earlier this am by Dr Hal Hope.    86 year old male with prior h/o essential hypertension, hyperlipidemia, gait disorder presents to ED for sob.  On arrival to ED, e was found to have pneumonia, admitted to [facility] for further management.    Pt seen and examined at bedside.  He is alert and able to answer simple questions.  Lungs. diminshed at bases, no wheezing heard.  CVS s1s2 heard, no JVD. RRR,  abd is soft non tender non distended bowel sounds wnl.  Extremities no pedal edema.     Plan:  Continue IV antibiotics for another 24 hours.  Get CT angio of the chest to rule out PE.  Urine for strep and legionella antigens SLP evaluation recommending regular with thin liquids.  Change to inpatient as pt needs another 24 hours of IV antibiotics and further evaluation of pleural effusions.    Hosie Poisson, MD

## 2019-04-26 NOTE — ED Notes (Signed)
This RN was able to contact pt's daughter and give update at this time. [name] currently with pt's daughter and is also aware of update.

## 2019-04-26 NOTE — ED Notes (Signed)
Jose Leach, son would like the nurse to call with an update, [phone].

## 2019-04-26 NOTE — H&P (Signed)
History and Physical    Jose Leach [id number] DOB: 07-28-1932 DOA: 04/26/2019  PCP: [name], MD  Patient coming from: [facility]  Chief Complaint: Shortness of breath.  HPI: Jose Leach is a 86 y.o. male with history of gait disorder essential tremor hypertension hyperlipidemia history of bradycardia with cervical myelopathy uses walker to walk was found to be increasingly short of the last 2 days was placed on prednisone antibiotics and patient states he has been having productive cough for last few days.  Denies chest pain fever or chills.  Patient states he has been feeling increasingly weak last few days.  No vomiting diarrhea abdominal pain.  ED Course: In the ER patient is afebrile not hypoxic but chest x-ray shows bilateral infiltrates with left pleural effusion with patient having persistent productive cough and given the comorbidities will be admitted for further observation for pneumonia and also Covid test is pending.  Labs show thrombocytopenia 139 leukopenia with EKG showing normal sinus rhythm rate around 61 bpm.  Patient was started on ceftriaxone and Zithromax.  Review of Systems: As per HPI, rest all negative.   Past Medical History:  Diagnosis Date  . Acute urinary retention 01/28/2013  . AKI (acute kidney injury) ([place]) 01/26/2013  . Altered mental status 09/09/2013  . Anemia    B12 deficiency  . ANXIETY 06/07/2007   Qualifier: Diagnosis of  By: Talbert Cage CMA ([place]), June    . ARTHRITIS 06/07/2007   Qualifier: Diagnosis of  By: Talbert Cage CMA ([place]), June    . Balance problems 04/05/2011  . Benign essential tremor   . Benign fibroma of prostate 10/11/2011  . Bilateral inguinal hernia 10/09/2012  . Bilateral leg edema 09/13/2012  . Bladder retention 02/05/2013  . Bradycardia, sinus 08/02/2012  . CAROTID ARTERY DISEASE 03/31/2007   Qualifier: Diagnosis of  By: Linna Darner MD, Gwyndolyn Saxon    . Carotid stenosis    s/p L CEA  . Cataract, nuclear  03/21/2014  . 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 Cervical spinal stenosis   . Cervical stenosis of spine   . Critical lower limb ischemia   . Degeneration macular 11/02/2011  . Depression    Dr Albertine Patricia  . Difficulty in walking(719.7) 04/05/2011  . ESOPHAGITIS 08/06/2002   Qualifier: Diagnosis of  By: Talbert Cage CMA ([place]), June    . Essential and other specified forms of tremor 04/18/2013  . Fall at home 01/06/2017   left hip pain  . Falls   . Family history of colon cancer 07/24/2012  . Fecal impaction ([place]) 10/11/2012  . Femoral hernia, bilateral s/p lap repair 01/16/2013 01/16/2013  . Glaucoma, compensated 03/21/2014  . H/O cardiovascular stress test    a. 02/2003 -> no ischemia/infarct.  Marland Kitchen HAMMER TOE 04/23/2010   Qualifier: Diagnosis of  By: Linna Darner MD, Gwyndolyn Saxon    . Hematoma of leg   . Hip hematoma, right 09/09/2013  . History of shingles 2009  . HTN (hypertension) 08/22/2012  . Hx of echocardiogram    Echocardiogram 08/01/12: Mild LVH, EF 55-60%, normal wall motion.  . Hydrocele 10/11/2011  . Hyperlipidemia   . 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 Hypothyroidism   . Macular degeneration disease   . Macular edema   . Migraines    Dr Jannifer Franklin  . MORTON'S NEUROMA, LEFT 08/22/2007   Qualifier: Diagnosis of  By: Linna Darner MD, Gwyndolyn Saxon    . PAD (peripheral artery disease) ([place])   . Peripheral neuropathy   . Personal history of colonic polyps 01/05/2013  2014 2 mm adenomatous polyp   . PREMATURE ATRIAL CONTRACTIONS 03/14/2006   Qualifier: Diagnosis of  By: Linna Darner MD, Gwendolyn Lima 12/16/2011  . SPINAL STENOSIS 03/14/2006   Qualifier: Diagnosis of  By: Linna Darner MD, William   Cervical spine    . Testosterone deficiency    Dr Lawerance Bach, [facility]  . Unspecified vitamin D deficiency 08/06/2012  . Urge incontinence 10/09/2012  . Urinary tract infection, site not specified 02/13/2013  . Weakness 08/02/2012    Past Surgical History:  Procedure Laterality Date  . APPENDECTOMY  1941  . BREAST CYST  EXCISION Left 01/16/2013   Procedure: MASS EXCISION AXILLA;  Surgeon: Adin Hector, MD;  Location: [place];  Service: General;  Laterality: Left;  . CAROTID ENDARTERECTOMY  2005    L  . CATARACT EXTRACTION Right   . COLONOSCOPY  2014   Dr. Deatra Ina  . EYE SURGERY Right 02/2012   retina  . INGUINAL HERNIA REPAIR Bilateral 01/16/2013   Procedure: LAPAROSCOPIC BILATERAL INGUINAL DIRECT AND INDIRECT, FEMORAL, AND OBTURATOR HERNIAS;  Surgeon: Adin Hector, MD;  Location: [place];  Service: General;  Laterality: Bilateral;  . INSERTION OF MESH N/A 01/16/2013   Procedure: INSERTION OF MESH;  Surgeon: Adin Hector, MD;  Location: [place];  Service: General;  Laterality: N/A;  . LUMBAR LAMINECTOMY    . TONSILLECTOMY AND ADENOIDECTOMY    . TOTAL HIP ARTHROPLASTY  2005   L     reports that he quit smoking about 40 years ago. His smoking use included pipe and cigars. He quit after 10.00 years of use. He has never used smokeless tobacco. He reports that he does not drink alcohol or use drugs.  Allergies  Allergen Reactions  . Latex Rash  . Tape Rash    Paper tape is ok to use     Family History  Problem Relation Age of Onset  . Stroke Mother 29  . Hypertension Mother   . Diabetes Mother   . Heart disease Mother   . Rectal cancer Father 44  . Heart disease Father        in 65s  . Cancer Father        colorectal  . Nephritis Sister   . Seizures Brother        died as child    Prior to Admission medications   Medication Sig Start Date End Date Taking? Authorizing Provider  acetaminophen (TYLENOL) 500 MG tablet Take 1,000 mg by mouth 3 (three) times daily. And q6hprn for pain/fever   Yes [provider]  albuterol (PROVENTIL) (2.5 MG/3ML) 0.083% nebulizer solution Take 2.5 mg by nebulization every 6 (six) hours as needed for wheezing or shortness of breath.   Yes [provider]  amLODipine (NORVASC) 5 MG tablet Take 2.5 mg by mouth daily.    Yes [provider]  ARIPiprazole (ABILIFY) 2 MG tablet Take 1 tablet (2 mg total) by mouth daily. 11/23/18  Yes [name], MD  aspirin 81 MG tablet Take 81 mg by mouth every morning.    Yes [provider]  atorvastatin (LIPITOR) 20 MG tablet Take 20 mg by mouth daily.   Yes [provider]  B Complex-C (B-COMPLEX WITH VITAMIN C) tablet Take 1 tablet by mouth daily.   Yes [provider]  bimatoprost (LUMIGAN) 0.01 % SOLN Place 1 drop into both eyes at bedtime.   Yes [provider]  buPROPion (WELLBUTRIN XL) 300  MG 24 hr tablet Take 300 mg by mouth daily. per psychiatry recommendation   Yes [provider]  Cholecalciferol (VITAMIN D3) 1000 units CAPS Take 1,000 Units by mouth daily.    Yes [provider]  clonazePAM (KLONOPIN) 0.5 MG tablet Take 1 tablet (0.5 mg total) by mouth at bedtime. 02/12/19  Yes [name], MD  dextromethorphan-guaiFENesin (ROBAFEN DM CGH/CHEST CONGEST) 10-100 MG/5ML liquid Take 10 mLs by mouth every 6 (six) hours as needed for cough.   Yes [provider]  erythromycin ophthalmic ointment Place 1 application into both eyes at bedtime.    Yes [provider]  ketoconazole (NIZORAL) 2 % cream Apply 1 application topically 2 (two) times daily as needed for irritation. Apply to buttocks. 05/15/16  Yes [provider]  levothyroxine (SYNTHROID, LEVOTHROID) 50 MCG tablet Take 25-50 mcg by mouth See admin instructions. 50 mcg mon, tues, wed, thur, fri, and sat. 25 mcg on Sunday   Yes [provider]  linaclotide Rolan Lipa) 145 MCG CAPS capsule Take 1 capsule (145 mcg total) by mouth daily before breakfast. 09/01/16  Yes [name], MD  loratadine (CLARITIN) 10 MG tablet Take 10 mg by mouth daily.   Yes [provider]  Melatonin 3 MG TABS Take 3 mg by mouth at bedtime.    Yes [provider]  mirtazapine (REMERON) 30 MG tablet Take 60 mg by mouth at bedtime.   Yes [provider]  Multiple Vitamins-Minerals (ICAPS PO) Take 1 tablet by mouth daily.   Yes [provider]  Polyethyl Glycol-Propyl Glycol (SYSTANE) 0.4-0.3 % SOLN Apply 1 drop to eye 4 (four) times daily.   Yes [provider]  polyethylene glycol (MIRALAX / GLYCOLAX) packet Take 17 g by mouth daily. Hold for loose stool   Yes [provider]  predniSONE (DELTASONE) 10 MG tablet Take 40 mg by mouth daily with breakfast.   Yes [provider]  sertraline (ZOLOFT) 100 MG tablet Take 200 mg by mouth every morning.    Yes [provider]  tamsulosin (FLOMAX) 0.4 MG CAPS capsule Take 0.4 mg by mouth daily after supper.  05/24/14  Yes [provider]  topiramate (TOPAMAX) 25 MG tablet Take 25 mg by mouth 2 (two) times daily.   Yes [provider]    Physical Exam: Constitutional: Moderately built and nourished. Vitals:   04/26/19 0330 04/26/19 0400 04/26/19 0430 04/26/19 0500  BP: (!) 144/68 (!) 143/77 (!) 160/74 (!) 143/72  Pulse: 60 63 61 (!) 59  Resp: 18 16 19 18   Temp:      TempSrc:      SpO2: 98% 97% 98% 98%   Eyes: Anicteric no pallor. ENMT: No discharge from the ears eyes nose or mouth. Neck: No mass felt.  No neck rigidity. Respiratory: No rhonchi or crepitations. Cardiovascular: S1-S2 heard. Abdomen: Soft nontender bowel sounds present. Musculoskeletal: No edema. Skin: No rash. Neurologic: Alert awake oriented to his name and place moves all extremities generally weak. Psychiatric: Oriented to name and place.   Labs on Admission: I have personally reviewed following labs and imaging studies  CBC: Recent Labs  Lab 04/26/19 0128 04/26/19 0320  WBC 3.2*  --   NEUTROABS 2.3  --   HGB 15.5 14.3  HCT 50.0 42.0  MCV 96.9  --   PLT 139*  --    Basic Metabolic Panel: Recent Labs  Lab 04/26/19 0320  NA 143  K 3.7  CL 109  GLUCOSE 115*  BUN 36*  CREATININE 1.10   GFR: Estimated Creatinine Clearance: 43.2  mL/min (by C-G formula based on SCr of 1.1 mg/dL). Liver Function Tests: No results for input(s): AST, ALT, ALKPHOS, BILITOT, PROT, ALBUMIN in the last 168 hours. No results for input(s): LIPASE, AMYLASE in the last 168 hours. No results for input(s): AMMONIA in the last 168 hours. Coagulation Profile: No results for input(s): INR, PROTIME in the last 168 hours. Cardiac Enzymes: No results for input(s): CKTOTAL, CKMB, CKMBINDEX, TROPONINI in the last 168 hours. BNP (last 3 results) No results for input(s): PROBNP in the last 8760 hours. HbA1C: No results for input(s): HGBA1C in the last 72 hours. CBG: No results for input(s): GLUCAP in the last 168 hours. Lipid Profile: No results for input(s): CHOL, HDL, LDLCALC, TRIG, CHOLHDL, LDLDIRECT in the last 72 hours. Thyroid Function Tests: No results for input(s): TSH, T4TOTAL, FREET4, T3FREE, THYROIDAB in the last 72 hours. Anemia Panel: No results for input(s): VITAMINB12, FOLATE, FERRITIN, TIBC, IRON, RETICCTPCT in the last 72 hours. Urine analysis:    Component Value Date/Time   COLORURINE YELLOW 01/10/2017 1600   APPEARANCEUR CLEAR 01/10/2017 1600   LABSPEC 1.026 01/10/2017 1600   PHURINE 6.0 01/10/2017 1600   GLUCOSEU NEGATIVE 01/10/2017 1600   GLUCOSEU NEGATIVE 07/29/2006 1302   HGBUR NEGATIVE 01/10/2017 1600   BILIRUBINUR NEGATIVE 01/10/2017 1600   KETONESUR NEGATIVE 01/10/2017 1600   PROTEINUR NEGATIVE 01/10/2017 1600   UROBILINOGEN 0.2 09/09/2013 1815   NITRITE NEGATIVE 01/10/2017 1600   LEUKOCYTESUR NEGATIVE 01/10/2017 1600   Sepsis Labs: @LABRCNTIP (procalcitonin:4,lacticidven:4) )No results found for this or any previous visit (from the past 240 hour(s)).   Radiological Exams on Admission: DG Chest Portable 1 View  Result Date: 04/26/2019 CLINICAL DATA:  Shortness of breath EXAM: PORTABLE CHEST 1 VIEW COMPARISON:  January 10, 2017 FINDINGS: There is no pneumothorax. There is a small left-sided pleural effusion.  Bibasilar airspace opacities are noted which are new from prior study. The heart size is stable. There is no acute osseous abnormality. IMPRESSION: New bibasilar airspace opacities and small left-sided pleural effusion. Findings are concerning for pneumonia in the appropriate clinical setting. Electronically Signed   By: Constance Holster M.D.   On: 04/26/2019 01:42    EKG: Independently reviewed.  Normal sinus rhythm.  Assessment/Plan Principal Problem:   CAP (community acquired pneumonia) Active Problems:   Hypothyroidism   HYPERLIPIDEMIA   PAD (peripheral artery disease) ([place])   Myelopathy concurrent with and due to spinal stenosis of cervical region (HCC)   Bradycardia, sinus   Hypertension   CKD (chronic kidney disease) stage 3, GFR 30-59 ml/min    1. Community-acquired pneumonia for which patient has been started on ceftriaxone and Zithromax.  Check urine for Legionella strep antigen Covid test is pending.  Will get swallow evaluation.  Sputum cultures. 2. Hypothyroidism on Synthroid. 3. Hypertension on amlodipine. 4. Hyperlipidemia on statins. 5. History of essential tremor gait disorder and cervical myelopathy. 6. Depression on Abilify. 7. Thrombocytopenia appears to be chronic.  Follow CBC. 8. Leukopenia could be due to infectious process.  D-dimer BNP and repeat labs are pending.  Covid test is pending.   DVT prophylaxis: Lovenox. Code Status: Full code.  Patient has a MOST form which states full code. Family Communication: Discussed with patient. Disposition Plan: Back to facility when stable. Consults called: None. Admission status: Observation.   Rise Patience MD [facility] Pager [phone].  If 7PM-7AM, please contact night-coverage [email or web address] Password [email or web address]  04/26/2019, 5:25 AM

## 2019-04-26 NOTE — ED Notes (Signed)
Attempted to call and update family.

## 2019-04-26 NOTE — ED Triage Notes (Signed)
PER EMS: Patient is coming from [facility] with c/o SOB. Patient has had an increase in shortness of breath for the past 2-3 days. Pt has been taking prednisone and albuterol which improved patients breathing but he still has rhonchi in left lower lobe. Pt has no trouble breathing at this time. Last breathing treatment was administered at 11pm, 04/25/19.   BP 186/92 HR 60 RR 24 SPO2 98% RA CBG 123 TEMP 97.5

## 2019-04-26 NOTE — ED Notes (Signed)
Placed on urine pure wick and condom cath waiting for urine

## 2019-04-26 NOTE — ED Notes (Signed)
ED TO INPATIENT HANDOFF REPORT  ED Nurse Name and Phone #: jon wled   S Name/Age/Gender Jose Leach 86 y.o. male Room/Bed: [id number]  Code Status   Code Status: Full Code  Home/SNF/Other Skilled nursing facility Patient oriented to: self Is this baseline? Yes   Triage Complete: Triage complete  Chief Complaint CAP (community acquired pneumonia) [J18.9]  Triage Note PER EMS: Patient is coming from [facility] with c/o SOB. Patient has had an increase in shortness of breath for the past 2-3 days. Pt has been taking prednisone and albuterol which improved patients breathing but he still has rhonchi in left lower lobe. Pt has no trouble breathing at this time. Last breathing treatment was administered at 11pm, 04/25/19.   BP 186/92 HR 60 RR 24 SPO2 98% RA CBG 123 TEMP 97.5    Allergies Allergies  Allergen Reactions  . Latex Rash  . Tape Rash    Paper tape is ok to use     Level of Care/Admitting Diagnosis ED Disposition    ED Disposition Condition [facility] Area: [place] [id number]  Level of Care: Telemetry [5]  Admit to tele based on following criteria: Monitor for Ischemic changes  Covid Evaluation: Symptomatic Person Under Investigation (PUI)  Diagnosis: CAP (community acquired pneumonia) [id number]  Admitting Physician: Rise Patience [phone]  Attending Physician: Rise Patience [email or web address]       B Medical/Surgery History Past Medical History:  Diagnosis Date  . Acute urinary retention 01/28/2013  . AKI (acute kidney injury) ([place]) 01/26/2013  . Altered mental status 09/09/2013  . Anemia    B12 deficiency  . ANXIETY 06/07/2007   Qualifier: Diagnosis of  By: Talbert Cage CMA ([place]), June    . ARTHRITIS 06/07/2007   Qualifier: Diagnosis of  By: Talbert Cage CMA ([place]), June    . Balance problems 04/05/2011  . Benign essential tremor   . Benign fibroma of prostate 10/11/2011  . Bilateral inguinal hernia  10/09/2012  . Bilateral leg edema 09/13/2012  . Bladder retention 02/05/2013  . Bradycardia, sinus 08/02/2012  . CAROTID ARTERY DISEASE 03/31/2007   Qualifier: Diagnosis of  By: Linna Darner MD, Gwyndolyn Saxon    . Carotid stenosis    s/p L CEA  . Cataract, nuclear 03/21/2014  . 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 Cervical spinal stenosis   . Cervical stenosis of spine   . Critical lower limb ischemia   . Degeneration macular 11/02/2011  . Depression    Dr Albertine Patricia  . Difficulty in walking(719.7) 04/05/2011  . ESOPHAGITIS 08/06/2002   Qualifier: Diagnosis of  By: Talbert Cage CMA ([place]), June    . Essential and other specified forms of tremor 04/18/2013  . Fall at home 01/06/2017   left hip pain  . Falls   . Family history of colon cancer 07/24/2012  . Fecal impaction ([place]) 10/11/2012  . Femoral hernia, bilateral s/p lap repair 01/16/2013 01/16/2013  . Glaucoma, compensated 03/21/2014  . H/O cardiovascular stress test    a. 02/2003 -> no ischemia/infarct.  Marland Kitchen HAMMER TOE 04/23/2010   Qualifier: Diagnosis of  By: Linna Darner MD, Gwyndolyn Saxon    . Hematoma of leg   . Hip hematoma, right 09/09/2013  . History of shingles 2009  . HTN (hypertension) 08/22/2012  . Hx of echocardiogram    Echocardiogram 08/01/12: Mild LVH, EF 55-60%, normal wall motion.  . Hydrocele 10/11/2011  . Hyperlipidemia   . 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 Hypothyroidism   . Macular degeneration  disease   . Macular edema   . Migraines    Dr Jannifer Franklin  . MORTON'S NEUROMA, LEFT 08/22/2007   Qualifier: Diagnosis of  By: Linna Darner MD, Gwyndolyn Saxon    . PAD (peripheral artery disease) ([place])   . Peripheral neuropathy   . Personal history of colonic polyps 01/05/2013   2014 2 mm adenomatous polyp   . PREMATURE ATRIAL CONTRACTIONS 03/14/2006   Qualifier: Diagnosis of  By: Linna Darner MD, Gwendolyn Lima 12/16/2011  . SPINAL STENOSIS 03/14/2006   Qualifier: Diagnosis of  By: Linna Darner MD, William   Cervical spine    . Testosterone deficiency    Dr Lawerance Bach, [facility]   . Unspecified vitamin D deficiency 08/06/2012  . Urge incontinence 10/09/2012  . Urinary tract infection, site not specified 02/13/2013  . Weakness 08/02/2012   Past Surgical History:  Procedure Laterality Date  . APPENDECTOMY  1941  . BREAST CYST EXCISION Left 01/16/2013   Procedure: MASS EXCISION AXILLA;  Surgeon: Adin Hector, MD;  Location: [place];  Service: General;  Laterality: Left;  . CAROTID ENDARTERECTOMY  2005    L  . CATARACT EXTRACTION Right   . COLONOSCOPY  2014   Dr. Deatra Ina  . EYE SURGERY Right 02/2012   retina  . INGUINAL HERNIA REPAIR Bilateral 01/16/2013   Procedure: LAPAROSCOPIC BILATERAL INGUINAL DIRECT AND INDIRECT, FEMORAL, AND OBTURATOR HERNIAS;  Surgeon: Adin Hector, MD;  Location: [place];  Service: General;  Laterality: Bilateral;  . INSERTION OF MESH N/A 01/16/2013   Procedure: INSERTION OF MESH;  Surgeon: Adin Hector, MD;  Location: [place];  Service: General;  Laterality: N/A;  . LUMBAR LAMINECTOMY    . TONSILLECTOMY AND ADENOIDECTOMY    . TOTAL HIP ARTHROPLASTY  2005   L     A IV Location/Drains/Wounds Patient Lines/Drains/Airways Status   Active Line/Drains/Airways    Name:   Placement date:   Placement time:   Site:   Days:   Peripheral IV 04/26/19 Right Antecubital   04/26/19    0317    Antecubital   less than 1   External Urinary Catheter   01/11/17    1135    --   835   Wound / Incision (Open or Dehisced) 09/11/13 Other (Comment) Sacrum Mid peeling to bottom-reddness   09/11/13    0105    Sacrum   2053          Intake/Output Last 24 hours  Intake/Output Summary (Last 24 hours) at 04/26/2019 2111 Last data filed at 04/26/2019 [id number] Gross per 24 hour  Intake 350 ml  Output --  Net 350 ml    Labs/Imaging Results for orders placed or performed during the hospital encounter of 04/26/19 (from the past 48 hour(s))  CBC with Differential/Platelet     Status: Abnormal   Collection Time: 04/26/19  1:28 AM  Result Value Ref Range   WBC 3.2  (L) 4.0 - 10.5 K/uL   RBC 5.16 4.22 - 5.81 MIL/uL   Hemoglobin 15.5 13.0 - 17.0 g/dL   HCT 50.0 39.0 - 52.0 %   MCV 96.9 80.0 - 100.0 fL   MCH 30.0 26.0 - 34.0 pg   MCHC 31.0 30.0 - 36.0 g/dL   RDW 14.4 11.5 - 15.5 %   Platelets 139 (L) 150 - 400 K/uL   nRBC 0.0 0.0 - 0.2 %   Neutrophils Relative % 51 %   Neutro Abs 2.3 1.7 - 7.7 K/uL   Band  Neutrophils 20 %   Lymphocytes Relative 11 %   Lymphs Abs 0.4 (L) 0.7 - 4.0 K/uL   Monocytes Relative 9 %   Monocytes Absolute 0.3 0.1 - 1.0 K/uL   Eosinophils Relative 2 %   Eosinophils Absolute 0.1 0.0 - 0.5 K/uL   Basophils Relative 0 %   Basophils Absolute 0.0 0.0 - 0.1 K/uL   Metamyelocytes Relative 7 %   Abs Immature Granulocytes 0.20 (H) 0.00 - 0.07 K/uL    Comment: Performed at [facility], [place] [address], [place], [place] [address]  Troponin I (High Sensitivity)     Status: Abnormal   Collection Time: 04/26/19  1:28 AM  Result Value Ref Range   Troponin I (High Sensitivity) 29 (H) <18 ng/L    Comment: (NOTE) Elevated high sensitivity troponin I (hsTnI) values and significant  changes across serial measurements may suggest ACS but many other  chronic and acute conditions are known to elevate hsTnI results.  Refer to the "Links" section for chest pain algorithms and additional  guidance. Performed at [facility], [place] [address] [place], [place] [address]   Brain natriuretic peptide     Status: Abnormal   Collection Time: 04/26/19  1:28 AM  Result Value Ref Range   B Natriuretic Peptide 119.5 (H) 0.0 - 100.0 pg/mL    Comment: Performed at [facility], [place] [address], [place], [place] [address]  SARS CORONAVIRUS 2 (TAT 6-24 HRS) Nasopharyngeal Nasopharyngeal Swab     Status: None   Collection Time: 04/26/19  1:28 AM   Specimen: Nasopharyngeal Swab  Result Value Ref Range   SARS Coronavirus 2 NEGATIVE NEGATIVE    Comment: (NOTE) SARS-CoV-2 target nucleic acids are NOT  DETECTED. The SARS-CoV-2 RNA is generally detectable in upper and lower respiratory specimens during the acute phase of infection. Negative results do not preclude SARS-CoV-2 infection, do not rule out co-infections with other pathogens, and should not be used as the sole basis for treatment or other patient management decisions. Negative results must be combined with clinical observations, patient history, and epidemiological information. The expected result is Negative. Fact Sheet for Patients: SugarRoll.be Fact Sheet for Healthcare Providers: https://www.woods-mathews.com/ This test is not yet approved or cleared by the [place] FDA and  has been authorized for detection and/or diagnosis of SARS-CoV-2 by FDA under an Emergency Use Authorization (EUA). This EUA will remain  in effect (meaning this test can be used) for the duration of the COVID-19 declaration under Section 56 4(b)(1) of the Act, 21 U.S.C. section 360bbb-3(b)(1), unless the authorization is terminated or revoked sooner. Performed at [facility], [place] [address], [place], [place] [address]   POC SARS Coronavirus 2 Ag-ED -     Status: None   Collection Time: 04/26/19  2:09 AM  Result Value Ref Range   SARS Coronavirus 2 Ag NEGATIVE NEGATIVE    Comment: (NOTE) SARS-CoV-2 antigen NOT DETECTED.  Negative results are presumptive.  Negative results do not preclude SARS-CoV-2 infection and should not be used as the sole basis for treatment or other patient management decisions, including infection  control decisions, particularly in the presence of clinical signs and  symptoms consistent with COVID-19, or in those who have been in contact with the virus.  Negative results must be combined with clinical observations, patient history, and epidemiological information. The expected result is Negative. Fact Sheet for Patients: PodPark.tn Fact  Sheet for Healthcare Providers: GiftContent.is This test is not  yet approved or cleared by the [place] and  has been authorized for detection and/or diagnosis of SARS-CoV-2 by FDA under an Emergency Use Authorization (EUA).  This EUA will remain in effect (meaning this test can be used) for the duration of  the COVID-19 de claration under Section 564(b)(1) of the Act, 21 U.S.C. section 360bbb-3(b)(1), unless the authorization is terminated or revoked sooner.   Troponin I (High Sensitivity)     Status: Abnormal   Collection Time: 04/26/19  3:03 AM  Result Value Ref Range   Troponin I (High Sensitivity) 29 (H) <18 ng/L    Comment: (NOTE) Elevated high sensitivity troponin I (hsTnI) values and significant  changes across serial measurements may suggest ACS but many other  chronic and acute conditions are known to elevate hsTnI results.  Refer to the "Links" section for chest pain algorithms and additional  guidance. Performed at [facility], [place] [address] [place], [place] [address]   Blood culture (routine x 2)     Status: None (Preliminary result)   Collection Time: 04/26/19  3:04 AM   Specimen: BLOOD RIGHT FOREARM  Result Value Ref Range   Specimen Description BLOOD RIGHT FOREARM    Special Requests      BOTTLES DRAWN AEROBIC AND ANAEROBIC Blood Culture adequate volume Performed at [place] [address], [place], [place] [address]    Culture PENDING    Report Status PENDING   I-stat chem 8, ED (not at [facility] or [facility])     Status: Abnormal   Collection Time: 04/26/19  3:20 AM  Result Value Ref Range   Sodium 143 135 - 145 mmol/L   Potassium 3.7 3.5 - 5.1 mmol/L   Chloride 109 98 - 111 mmol/L   BUN 36 (H) 8 - 23 mg/dL   Creatinine, Ser 1.10 0.61 - 1.24 mg/dL   Glucose, Bld 115 (H) 70 - 99 mg/dL    Comment: Glucose reference range applies only to samples taken after fasting for at least 8 hours.    Calcium, Ion 1.27 1.15 - 1.40 mmol/L   TCO2 24 22 - 32 mmol/L   Hemoglobin 14.3 13.0 - 17.0 g/dL   HCT 42.0 39.0 - 52.0 %  Comprehensive metabolic panel     Status: Abnormal   Collection Time: 04/26/19  5:34 AM  Result Value Ref Range   Sodium 143 135 - 145 mmol/L   Potassium 3.4 (L) 3.5 - 5.1 mmol/L   Chloride 114 (H) 98 - 111 mmol/L   CO2 23 22 - 32 mmol/L   Glucose, Bld 114 (H) 70 - 99 mg/dL    Comment: Glucose reference range applies only to samples taken after fasting for at least 8 hours.   BUN 38 (H) 8 - 23 mg/dL   Creatinine, Ser 1.04 0.61 - 1.24 mg/dL   Calcium 8.2 (L) 8.9 - 10.3 mg/dL   Total Protein 5.4 (L) 6.5 - 8.1 g/dL   Albumin 2.8 (L) 3.5 - 5.0 g/dL   AST 24 15 - 41 U/L   ALT 20 0 - 44 U/L   Alkaline Phosphatase 36 (L) 38 - 126 U/L   Total Bilirubin 0.6 0.3 - 1.2 mg/dL   GFR calc non Af Amer >60 >60 mL/min   GFR calc Af Amer >60 >60 mL/min   Anion gap 6 5 - 15    Comment: Performed at [facility], [place] [address] [place], [place] [address]  CBC WITH DIFFERENTIAL  Status: Abnormal   Collection Time: 04/26/19  5:34 AM  Result Value Ref Range   WBC 2.8 (L) 4.0 - 10.5 K/uL   RBC 3.94 (L) 4.22 - 5.81 MIL/uL   Hemoglobin 11.9 (L) 13.0 - 17.0 g/dL   HCT 38.4 (L) 39.0 - 52.0 %   MCV 97.5 80.0 - 100.0 fL   MCH 30.2 26.0 - 34.0 pg   MCHC 31.0 30.0 - 36.0 g/dL   RDW 14.2 11.5 - 15.5 %   Platelets 123 (L) 150 - 400 K/uL   nRBC 0.0 0.0 - 0.2 %   Neutrophils Relative % 69 %   Neutro Abs 1.9 1.7 - 7.7 K/uL   Lymphocytes Relative 18 %   Lymphs Abs 0.5 (L) 0.7 - 4.0 K/uL   Monocytes Relative 12 %   Monocytes Absolute 0.3 0.1 - 1.0 K/uL   Eosinophils Relative 1 %   Eosinophils Absolute 0.0 0.0 - 0.5 K/uL   Basophils Relative 0 %   Basophils Absolute 0.0 0.0 - 0.1 K/uL   Immature Granulocytes 0 %   Abs Immature Granulocytes 0.01 0.00 - 0.07 K/uL    Comment: Performed at [facility], [place] [address], [place], [place] [address]   TSH     Status: None   Collection Time: 04/26/19  5:34 AM  Result Value Ref Range   TSH 0.994 0.350 - 4.500 uIU/mL    Comment: Performed by a 3rd Generation assay with a functional sensitivity of <=0.01 uIU/mL. Performed at [facility], [place] [address], [place], [place] [address]   Brain natriuretic peptide     Status: Abnormal   Collection Time: 04/26/19  5:34 AM  Result Value Ref Range   B Natriuretic Peptide 145.8 (H) 0.0 - 100.0 pg/mL    Comment: Performed at [facility], [place] [address] [place], [place] [address]  D-dimer, quantitative (not at [facility])     Status: Abnormal   Collection Time: 04/26/19  5:34 AM  Result Value Ref Range   D-Dimer, Quant 0.84 (H) 0.00 - 0.50 ug/mL-FEU    Comment: (NOTE) At the manufacturer cut-off of 0.50 ug/mL FEU, this assay has been documented to exclude PE with a sensitivity and negative predictive value of 97 to 99%.  At this time, this assay has not been approved by the FDA to exclude DVT/VTE. Results should be correlated with clinical presentation. Performed at [facility], [place] [address], [place], [place] [address]   Save Smear     Status: None   Collection Time: 04/26/19  5:55 AM  Result Value Ref Range   Smear Review SMEAR STAINED AND AVAILABLE FOR REVIEW     Comment: Performed at [facility], [place] [address], [place], [place] [address]   CT ANGIO CHEST PE W OR WO CONTRAST  Result Date: 04/26/2019 CLINICAL DATA:  Shortness of breath, elevated D-dimer, rule out PE EXAM: CT ANGIOGRAPHY CHEST WITH CONTRAST TECHNIQUE: Multidetector CT imaging of the chest was performed using the standard protocol during bolus administration of intravenous contrast. Multiplanar CT image reconstructions and MIPs were obtained to evaluate the vascular anatomy. CONTRAST:  180mL OMNIPAQUE IOHEXOL 350 MG/ML SOLN COMPARISON:  None. FINDINGS: Cardiovascular: Satisfactory opacification of the  pulmonary arteries to the segmental level. No evidence of pulmonary embolism. Normal heart size. Left coronary artery calcifications. No pericardial effusion. Mediastinum/Nodes: No enlarged mediastinal, hilar, or axillary lymph nodes. Frothy debris in the upper trachea. Thyroid gland and esophagus demonstrate no significant  findings. Lungs/Pleura: Small bilateral pleural effusions. Heterogeneous airspace opacity of the bilateral lung bases, with some tubular bronchiectasis and perhaps chronic fibrotic interstitial change, particularly in the right lung base. Upper Abdomen: No acute abnormality. Musculoskeletal: No chest wall abnormality. No acute or significant osseous findings. Review of the MIP images confirms the above findings. IMPRESSION: 1.  Negative examination for pulmonary embolism. 2. Small bilateral pleural effusions with heterogeneous airspace opacity of the bilateral lung bases, consistent with infection or aspiration. 3. There is some tubular bronchiectasis and perhaps underlying chronic fibrotic interstitial change, particularly in the right lung base. These findings could be due to fibrotic interstitial lung disease or sequelae of infection/aspiration. Consider follow-up ILD protocol CT examination of the chest at resolution of acute illness and pleural effusions to further assess. 4.  Coronary artery disease. Electronically Signed   By: Eddie Candle M.D.   On: 04/26/2019 10:18   DG Chest Portable 1 View  Result Date: 04/26/2019 CLINICAL DATA:  Shortness of breath EXAM: PORTABLE CHEST 1 VIEW COMPARISON:  January 10, 2017 FINDINGS: There is no pneumothorax. There is a small left-sided pleural effusion. Bibasilar airspace opacities are noted which are new from prior study. The heart size is stable. There is no acute osseous abnormality. IMPRESSION: New bibasilar airspace opacities and small left-sided pleural effusion. Findings are concerning for pneumonia in the appropriate clinical setting.  Electronically Signed   By: Constance Holster M.D.   On: 04/26/2019 01:42    Pending Labs Unresulted Labs (From admission, onward)    Start     Ordered   05/03/19 0500  Creatinine, serum  (enoxaparin (LOVENOX)    CrCl >/= 30 ml/min)  Weekly,   R    Comments: while on enoxaparin therapy    04/26/19 0524   04/27/19 0500  CBC with Differential/Platelet  Tomorrow morning,   R     04/26/19 1735   04/27/19 0500  Comprehensive metabolic panel  Tomorrow morning,   R     04/26/19 1735   04/26/19 0524  Legionella Pneumophila Serogp 1 Ur Ag  Once,   STAT     04/26/19 0524   04/26/19 0524  Strep pneumoniae urinary antigen  Once,   STAT     04/26/19 0524   04/26/19 0247  Blood culture (routine x 2)  BLOOD CULTURE X 2,   STAT    Question Answer Comment  Patient immune status Normal   Release to patient Immediate      04/26/19 0246          Vitals/Pain Today's Vitals   04/26/19 1615 04/26/19 1630 04/26/19 1756 04/26/19 2057  BP:  99/67  (!) 151/71  Pulse:  65  68  Resp: (!) 21 18  16   Temp:   (!) 100.9 F (38.3 C) 99 F (37.2 C)  TempSrc:   Rectal Oral  SpO2:  94%  93%  PainSc:    0-No pain    Isolation Precautions No active isolations  Medications Medications  aspirin EC tablet 81 mg (81 mg Oral Given 04/26/19 1311)  amLODipine (NORVASC) tablet 2.5 mg (2.5 mg Oral Given 04/26/19 1310)  atorvastatin (LIPITOR) tablet 20 mg (20 mg Oral Given 04/26/19 1313)  ARIPiprazole (ABILIFY) tablet 2 mg (2 mg Oral Given 04/26/19 1312)  buPROPion (WELLBUTRIN XL) 24 hr tablet 300 mg (300 mg Oral Given 04/26/19 1315)  mirtazapine (REMERON) tablet 60 mg (has no administration in time range)  sertraline (ZOLOFT) tablet 200 mg (200 mg Oral Given 04/26/19 1312)  levothyroxine (SYNTHROID) tablet 50 mcg (50 mcg Oral Given 04/26/19 0546)  linaclotide (LINZESS) capsule 145 mcg (145 mcg Oral Given 04/26/19 1315)  polyethylene glycol (MIRALAX / GLYCOLAX) packet 17 g (17 g Oral Not Given 04/26/19 1911)   tamsulosin (FLOMAX) capsule 0.4 mg (0.4 mg Oral Given 04/26/19 1810)  Melatonin TABS 3 mg (has no administration in time range)  clonazePAM (KLONOPIN) tablet 0.5 mg (has no administration in time range)  topiramate (TOPAMAX) tablet 25 mg (25 mg Oral Given 04/26/19 1315)  loratadine (CLARITIN) tablet 10 mg (10 mg Oral Given 04/26/19 1316)  latanoprost (XALATAN) 0.005 % ophthalmic solution 1 drop (has no administration in time range)  acetaminophen (TYLENOL) tablet 650 mg (650 mg Oral Given 04/26/19 1810)    Or  acetaminophen (TYLENOL) suppository 650 mg ( Rectal See Alternative 04/26/19 1810)  ondansetron (ZOFRAN) tablet 4 mg (has no administration in time range)    Or  ondansetron (ZOFRAN) injection 4 mg (has no administration in time range)  cefTRIAXone (ROCEPHIN) 2 g in sodium chloride 0.9 % 100 mL IVPB (has no administration in time range)  azithromycin (ZITHROMAX) 500 mg in sodium chloride 0.9 % 250 mL IVPB (has no administration in time range)  enoxaparin (LOVENOX) injection 40 mg (40 mg Subcutaneous Given 04/26/19 1310)  levothyroxine (SYNTHROID) tablet 25 mcg (has no administration in time range)  cefTRIAXone (ROCEPHIN) 1 g in sodium chloride 0.9 % 100 mL IVPB (0 g Intravenous Stopped 04/26/19 0400)  azithromycin (ZITHROMAX) 500 mg in sodium chloride 0.9 % 250 mL IVPB (0 mg Intravenous Stopped 04/26/19 0527)  potassium chloride SA (KLOR-CON) CR tablet 40 mEq (40 mEq Oral Given 04/26/19 1313)  sodium chloride (PF) 0.9 % injection (  Given 04/26/19 1331)  iohexol (OMNIPAQUE) 350 MG/ML injection 100 mL (100 mLs Intravenous Contrast Given 04/26/19 0941)    Mobility walks with person assist High fall risk   Focused Assessments Pulmonary Assessment Handoff:  Lung sounds:   O2 Device: Room Air        R Recommendations: See Admitting Provider Note  Report given to:   Additional Notes: high fall risk

## 2019-04-26 NOTE — ED Provider Notes (Signed)
[facility] DEPT Provider Note   CSN: [id number] Arrival date & time: 04/26/19  0058     History No chief complaint on file.   Jose Leach is a 86 y.o. male.  The history is provided by the EMS personnel. The history is limited by the condition of the patient.  Shortness of Breath Severity:  Moderate Onset quality:  Gradual Duration:  2 days Timing:  Constant Progression:  Worsening Chronicity:  New Context: not activity and not animal exposure   Relieved by:  Nothing Worsened by:  Nothing Ineffective treatments: nebs and steroids. Associated symptoms: wheezing   Associated symptoms: no fever and no sore throat   Risk factors: no recent alcohol use   Seen by PMD for same and diagnosed with viral bronchitis. Started on nebs and steroids per report.       Past Medical History:  Diagnosis Date  . Acute urinary retention 01/28/2013  . AKI (acute kidney injury) ([place]) 01/26/2013  . Altered mental status 09/09/2013  . Anemia    B12 deficiency  . ANXIETY 06/07/2007   Qualifier: Diagnosis of  By: Talbert Cage CMA ([place]), June    . ARTHRITIS 06/07/2007   Qualifier: Diagnosis of  By: Talbert Cage CMA ([place]), June    . Balance problems 04/05/2011  . Benign essential tremor   . Benign fibroma of prostate 10/11/2011  . Bilateral inguinal hernia 10/09/2012  . Bilateral leg edema 09/13/2012  . Bladder retention 02/05/2013  . Bradycardia, sinus 08/02/2012  . CAROTID ARTERY DISEASE 03/31/2007   Qualifier: Diagnosis of  By: Linna Darner MD, Gwyndolyn Saxon    . Carotid stenosis    s/p L CEA  . Cataract, nuclear 03/21/2014  . 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 Cervical spinal stenosis   . Cervical stenosis of spine   . Critical lower limb ischemia   . Degeneration macular 11/02/2011  . Depression    Dr Albertine Patricia  . Difficulty in walking(719.7) 04/05/2011  . ESOPHAGITIS 08/06/2002   Qualifier: Diagnosis of  By: Talbert Cage CMA ([place]), June    . Essential and other specified  forms of tremor 04/18/2013  . Fall at home 01/06/2017   left hip pain  . Falls   . Family history of colon cancer 07/24/2012  . Fecal impaction ([place]) 10/11/2012  . Femoral hernia, bilateral s/p lap repair 01/16/2013 01/16/2013  . Glaucoma, compensated 03/21/2014  . H/O cardiovascular stress test    a. 02/2003 -> no ischemia/infarct.  Marland Kitchen HAMMER TOE 04/23/2010   Qualifier: Diagnosis of  By: Linna Darner MD, Gwyndolyn Saxon    . Hematoma of leg   . Hip hematoma, right 09/09/2013  . History of shingles 2009  . HTN (hypertension) 08/22/2012  . Hx of echocardiogram    Echocardiogram 08/01/12: Mild LVH, EF 55-60%, normal wall motion.  . Hydrocele 10/11/2011  . Hyperlipidemia   . 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 Hypothyroidism   . Macular degeneration disease   . Macular edema   . Migraines    Dr Jannifer Franklin  . MORTON'S NEUROMA, LEFT 08/22/2007   Qualifier: Diagnosis of  By: Linna Darner MD, Gwyndolyn Saxon    . PAD (peripheral artery disease) ([place])   . Peripheral neuropathy   . Personal history of colonic polyps 01/05/2013   2014 2 mm adenomatous polyp   . PREMATURE ATRIAL CONTRACTIONS 03/14/2006   Qualifier: Diagnosis of  By: Linna Darner MD, Gwendolyn Lima 12/16/2011  . SPINAL STENOSIS 03/14/2006   Qualifier: Diagnosis of  By: Linna Darner MD, Gwyndolyn Saxon   Cervical  spine    . Testosterone deficiency    Dr Lawerance Bach, [facility]  . Unspecified vitamin D deficiency 08/06/2012  . Urge incontinence 10/09/2012  . Urinary tract infection, site not specified 02/13/2013  . Weakness 08/02/2012    Patient Active Problem List   Diagnosis Date Noted  . Generalized osteoarthritis of multiple sites 01/23/2019  . Senile angioma 07/04/2018  . Allergic rhinitis 06/13/2018  . Weight loss 04/04/2018  . Skin irritation 04/04/2018  . Acute left hemiparesis ([place]) 12/25/2017  . 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 Hip fracture, left, sequela 03/17/2017  . Closed nondisplaced fracture of left acetabulum  ([place]) 01/17/2017  . Severe protein-calorie malnutrition ([place]) 01/17/2017  . Normocytic anemia 01/10/2017  . Major psychotic depression, recurrent ([place]) 11/17/2016  . CKD (chronic kidney disease) stage 3, GFR 30-59 ml/min 11/17/2016  . Callus 07/23/2014  . Fall 07/09/2014  . Pain in left hip 05/22/2014  . AMD (age-related macular degeneration), wet ([place]) 03/21/2014  . Glaucoma, compensated 03/21/2014  . Benign prostatic hyperplasia with urinary obstruction 12/27/2013  . Chronic conjunctivitis 10/02/2013  . Knee pain, right 09/25/2013  . Other testicular hypofunction 09/14/2013  . Benign essential tremor 04/18/2013  . Renal cyst, left 01/30/2013  . Bilateral inguinal hernia 10/09/2012  . Urge incontinence 10/09/2012  . Bilateral leg edema 09/13/2012  . Hypertension 08/22/2012  . Unspecified vitamin D deficiency 08/06/2012  . Bradycardia, sinus 08/02/2012  . Chronic constipation 07/24/2012  . Bladder neck obstruction 10/11/2011  . Hydrocele in adult 10/11/2011  . Hammer toe 04/23/2010  . Unsteady gait 11/12/2009  . MORTON'S NEUROMA, LEFT 08/22/2007  . Anxiety state 06/07/2007  . Arthropathy 06/07/2007  . CAROTID ARTERY DISEASE 03/31/2007  . PAD (peripheral artery disease) ([place]) 03/16/2007  . Hypothyroidism 11/10/2006  . HYPERLIPIDEMIA 11/10/2006  . PREMATURE ATRIAL CONTRACTIONS 03/14/2006  . Myelopathy concurrent with and due to spinal stenosis of cervical region ([place]) 03/14/2006  . ESOPHAGITIS 08/06/2002    Past Surgical History:  Procedure Laterality Date  . APPENDECTOMY  1941  . BREAST CYST EXCISION Left 01/16/2013   Procedure: MASS EXCISION AXILLA;  Surgeon: Adin Hector, MD;  Location: [facility];  Service: General;  Laterality: Left;  . CAROTID ENDARTERECTOMY  2005    L  . CATARACT EXTRACTION Right   . COLONOSCOPY  2014   Dr. Deatra Ina  . EYE SURGERY Right 02/2012   retina  . INGUINAL HERNIA REPAIR Bilateral 01/16/2013   Procedure: LAPAROSCOPIC BILATERAL INGUINAL DIRECT  AND INDIRECT, FEMORAL, AND OBTURATOR HERNIAS;  Surgeon: Adin Hector, MD;  Location: [facility];  Service: General;  Laterality: Bilateral;  . INSERTION OF MESH N/A 01/16/2013   Procedure: INSERTION OF MESH;  Surgeon: Adin Hector, MD;  Location: [facility];  Service: General;  Laterality: N/A;  . LUMBAR LAMINECTOMY    . TONSILLECTOMY AND ADENOIDECTOMY    . TOTAL HIP ARTHROPLASTY  2005   L       Family History  Problem Relation Age of Onset  . Stroke Mother 3  . Hypertension Mother   . Diabetes Mother   . Heart disease Mother   . Rectal cancer Father 69  . Heart disease Father        in 82s  . Cancer Father        colorectal  . Nephritis Sister   . Seizures Brother        died as child    Social History   Tobacco Use  .  Smoking status: Former Smoker    Years: 10.00    Types: Pipe, Cigars    Quit date: 02/16/1979    Years since quitting: 40.2  . Smokeless tobacco: Never Used  . Tobacco comment: Quit about age 84   Substance Use Topics  . Alcohol use: No    Alcohol/week: 4.0 standard drinks    Types: 4 Standard drinks or equivalent per week  . Drug use: No    Home Medications Prior to Admission medications   Medication Sig Start Date End Date Taking? Authorizing Provider  acetaminophen (TYLENOL) 500 MG tablet Take 1,000 mg by mouth 3 (three) times daily.     [provider]  Amino Acids-Protein Hydrolys (FEEDING SUPPLEMENT, PRO-STAT SUGAR FREE 64,) LIQD Take 30 mLs by mouth daily. To promote wound healing    [provider]  amLODipine (NORVASC) 5 MG tablet Take 7.5 mg by mouth daily.     [provider]  ARIPiprazole (ABILIFY) 2 MG tablet Take 1 tablet (2 mg total) by mouth daily. 11/23/18   [name], MD  aspirin 81 MG tablet Take 81 mg by mouth every morning.     [provider]  atorvastatin (LIPITOR) 20 MG tablet Take 20 mg by mouth daily.    [provider]  B Complex-C (B-COMPLEX WITH VITAMIN C) tablet Take 1  tablet by mouth daily.    [provider]  bimatoprost (LUMIGAN) 0.01 % SOLN Place 1 drop into both eyes at bedtime.    [provider]  buPROPion (WELLBUTRIN XL) 300 MG 24 hr tablet Take 300 mg by mouth daily. per psychiatry recommendation    [provider]  Cholecalciferol (VITAMIN D3) 1000 units CAPS Take 1,000 Units by mouth daily.     [provider]  clonazePAM (KLONOPIN) 0.5 MG tablet Take 1 tablet (0.5 mg total) by mouth at bedtime. 02/12/19   [name], MD  dextromethorphan-guaiFENesin (ROBAFEN DM [facility]GH/CHEST CONGEST) 10-100 MG/5ML liquid Take 10 mLs by mouth every 6 (six) hours as needed for cough.    [provider]  DM-Benzocaine-Menthol [facility] TOTAL MT) Use as directed 1 lozenge in the mouth or throat every 2 (two) hours as needed.    [provider]  erythromycin ophthalmic ointment Place 1 application into both eyes at bedtime.     [provider]  ketoconazole (NIZORAL) 2 % cream Apply 1 application topically 2 (two) times daily as needed for irritation. Apply to buttocks. 05/15/16   [provider]  lactose free nutrition (BOOST) LIQD Take 237 mLs by mouth 2 (two) times daily between meals. For weight loss    [provider]  levothyroxine (SYNTHROID, LEVOTHROID) 50 MCG tablet Take 50 mcg by mouth daily before breakfast.     [provider]  linaclotide (LINZESS) 145 MCG CAPS capsule Take 1 capsule (145 mcg total) by mouth daily before breakfast. 09/01/16   [name], MD  loratadine (CLARITIN) 10 MG tablet Take 10 mg by mouth daily.    [provider]  Melatonin 3 MG TABS Take 1 tablet by mouth at bedtime.    [provider]  mirtazapine (REMERON) 30 MG tablet Take 60 mg by mouth at bedtime.    [provider]  Multiple Vitamins-Minerals (ICAPS PO) Take 1 tablet by mouth daily.    [provider]  Polyethyl Glycol-Propyl Glycol (SYSTANE) 0.4-0.3  % SOLN Apply 1 drop to eye 4 (four) times daily.    [provider]  polyethylene glycol (MIRALAX / GLYCOLAX) packet Take 17 g by mouth daily. Hold for loose stool    [provider]  sertraline (ZOLOFT) 100 MG tablet Take 200 mg by mouth every morning.     [provider]  tamsulosin (FLOMAX) 0.4 MG CAPS capsule TAKE 1 CAPSULE (0.4 MG TOTAL) BY MOUTH DAILY. 05/24/14   [provider]  topiramate (TOPAMAX) 25 MG tablet Take 25 mg by mouth 2 (two) times daily.    [provider]    Allergies    Latex and Tape  Review of Systems   Review of Systems  Unable to perform ROS: Dementia  Constitutional: Negative for fever.  HENT: Negative for sore throat.   Respiratory: Positive for shortness of breath and wheezing.     Physical Exam Updated Vital Signs There were no vitals taken for this visit.  Physical Exam Vitals and nursing note reviewed.  Constitutional:      General: He is not in acute distress.    Appearance: He is normal weight.  HENT:     Head: Normocephalic and atraumatic.     Nose: Nose normal.  Eyes:     Conjunctiva/sclera: Conjunctivae normal.     Pupils: Pupils are equal, round, and reactive to light.  Cardiovascular:     Rate and Rhythm: Normal rate and regular rhythm.     Pulses: Normal pulses.     Heart sounds: Normal heart sounds.  Pulmonary:     Effort: No respiratory distress.     Breath sounds: Rhonchi present.  Abdominal:     General: Abdomen is flat.     Tenderness: There is no abdominal tenderness. There is no guarding.  Musculoskeletal:        General: Normal range of motion.     Cervical back: Normal range of motion and neck supple.     Right lower leg: No edema.     Left lower leg: No edema.  Skin:    General: Skin is warm and dry.     Capillary Refill: Capillary refill takes less than 2 seconds.  Neurological:     General: No focal deficit present.     Mental Status: He is alert.  Psychiatric:         Mood and Affect: Mood normal.        Behavior: Behavior normal.     ED Results / Procedures / Treatments   Labs (all labs ordered are listed, but only abnormal results are displayed) Results for orders placed or performed during the hospital encounter of 04/26/19  CBC with Differential/Platelet  Result Value Ref Range   WBC 3.2 (L) 4.0 - 10.5 K/uL   RBC 5.16 4.22 - 5.81 MIL/uL   Hemoglobin 15.5 13.0 - 17.0 g/dL   HCT 50.0 39.0 - 52.0 %   MCV 96.9 80.0 - 100.0 fL   MCH 30.0 26.0 - 34.0 pg   MCHC 31.0 30.0 - 36.0 g/dL   RDW 14.4 11.5 - 15.5 %   Platelets 139 (L) 150 - 400 K/uL   nRBC 0.0 0.0 - 0.2 %   Neutrophils Relative % 51 %   Neutro Abs 2.3 1.7 - 7.7 K/uL   Band Neutrophils 20 %   Lymphocytes Relative 11 %   Lymphs Abs 0.4 (L) 0.7 - 4.0 K/uL   Monocytes Relative 9 %   Monocytes Absolute 0.3 0.1 - 1.0 K/uL   Eosinophils Relative 2 %   Eosinophils Absolute 0.1 0.0 - 0.5 K/uL  Basophils Relative 0 %   Basophils Absolute 0.0 0.0 - 0.1 K/uL   Metamyelocytes Relative 7 %   Abs Immature Granulocytes 0.20 (H) 0.00 - 0.07 K/uL  Brain natriuretic peptide  Result Value Ref Range   B Natriuretic Peptide 119.5 (H) 0.0 - 100.0 pg/mL  I-stat chem 8, ED (not at [facility] or [facility])  Result Value Ref Range   Sodium 143 135 - 145 mmol/L   Potassium 3.7 3.5 - 5.1 mmol/L   Chloride 109 98 - 111 mmol/L   BUN 36 (H) 8 - 23 mg/dL   Creatinine, Ser 1.10 0.61 - 1.24 mg/dL   Glucose, Bld 115 (H) 70 - 99 mg/dL   Calcium, Ion 1.27 1.15 - 1.40 mmol/L   TCO2 24 22 - 32 mmol/L   Hemoglobin 14.3 13.0 - 17.0 g/dL   HCT 42.0 39.0 - 52.0 %  POC SARS Coronavirus 2 Ag-ED -  Result Value Ref Range   SARS Coronavirus 2 Ag NEGATIVE NEGATIVE  Troponin I (High Sensitivity)  Result Value Ref Range   Troponin I (High Sensitivity) 29 (H) <18 ng/L  Troponin I (High Sensitivity)  Result Value Ref Range   Troponin I (High Sensitivity) 29 (H) <18 ng/L   DG Chest Portable 1 View  Result Date:  04/26/2019 CLINICAL DATA:  Shortness of breath EXAM: PORTABLE CHEST 1 VIEW COMPARISON:  January 10, 2017 FINDINGS: There is no pneumothorax. There is a small left-sided pleural effusion. Bibasilar airspace opacities are noted which are new from prior study. The heart size is stable. There is no acute osseous abnormality. IMPRESSION: New bibasilar airspace opacities and small left-sided pleural effusion. Findings are concerning for pneumonia in the appropriate clinical setting. Electronically Signed   By: Constance Holster M.D.   On: 04/26/2019 01:42    EKG  EKG Interpretation  Date/Time:  Thursday April 26 2019 04:09:23 EST Ventricular Rate:  62 PR Interval:    QRS Duration: 106 QT Interval:  451 QTC Calculation: 428 R Axis:   17 Text Interpretation: Sinus rhythm Confirmed by Randal Buba, Bobie Kistler ([id number]) on 04/26/2019 4:18:37 AM      Radiology No results found.  Procedures Procedures (including critical care time)  Medications Ordered in ED Medications  azithromycin (ZITHROMAX) 500 mg in sodium chloride 0.9 % 250 mL IVPB (has no administration in time range)  cefTRIAXone (ROCEPHIN) 1 g in sodium chloride 0.9 % 100 mL IVPB (1 g Intravenous New Bag/Given 04/26/19 [id number])    ED Course  I have reviewed the triage vital signs and the nursing notes.  Pertinent labs & imaging results that were available during my care of the patient were reviewed by me and considered in my medical decision making (see chart for details).     Final Clinical Impression(s) / ED Diagnoses This is either a multifocal PNA with elevated troponin though I have concerns given CXR and white count for covid.  I will admit to medicine.  Cultures sent. Troponins will need to be cycled.     Kiarah Eckstein, MD 04/26/19 [phone]

## 2019-04-26 NOTE — ED Notes (Signed)
Attempted to call update to pt's wife, [name] at [phone] with no answer. Also attempted to call update to pt's daughter, Beatrix Fetters at [phone] with no answer.

## 2019-04-26 NOTE — Progress Notes (Signed)
Location:      Place of Service:    Provider: [facility] Masiel Gentzler NP  [name], MD  Patient Care Team: [name], MD as PCP - General (Internal Medicine) [name], MD as Consulting Physician (Urology) [name], MD as Consulting Physician (Neurology) [name], MD as Consulting Physician (Dermatology) [name], MD (Inactive) as Consulting Physician (Gastroenterology) [name], MD as Consulting Physician (Orthopedic Surgery) [name], MD as Consulting Physician (Cardiology) [name], MD as Consulting Physician (Ophthalmology) [name], MD as Referring Physician (Psychiatry) [name], MD as Consulting Physician (General Surgery) [name], MD as Consulting Physician (Orthopedic Surgery) [name], [name], NP as Nurse Practitioner (Family Medicine) Braidon Chermak X, NP as Nurse Practitioner (Internal Medicine)  Extended Emergency Contact Information Primary Emergency Contact: Alguire,[name] Address: [address]          [place], [place] [address] [place] Phone: [phone] Relation: Spouse  Code Status:  DNR Goals of care: Advanced Directive information Advanced Directives 04/24/2019  Does Patient Have a Medical Advance Directive? Yes  Type of Advance Directive Out of facility DNR (pink MOST or yellow form);[place];Living will  Does patient want to make changes to medical advance directive? No - Patient declined  Copy of [place] in Chart? Yes - validated most recent copy scanned in chart (See row information)  Pre-existing out of facility DNR order (yellow form or pink MOST form) Pink MOST form placed in chart (order not valid for inpatient use)     No chief complaint on file.   HPI:  Pt is a 86 y.o. male seen today for medical management of chronic diseases.     Past Medical History:  Diagnosis Date  . Acute urinary  retention 01/28/2013  . AKI (acute kidney injury) ([place]) 01/26/2013  . Altered mental status 09/09/2013  . Anemia    B12 deficiency  . ANXIETY 06/07/2007   Qualifier: Diagnosis of  By: Talbert Cage CMA ([place]), June    . ARTHRITIS 06/07/2007   Qualifier: Diagnosis of  By: Talbert Cage CMA ([place]), June    . Balance problems 04/05/2011  . Benign essential tremor   . Benign fibroma of prostate 10/11/2011  . Bilateral inguinal hernia 10/09/2012  . Bilateral leg edema 09/13/2012  . Bladder retention 02/05/2013  . Bradycardia, sinus 08/02/2012  . CAROTID ARTERY DISEASE 03/31/2007   Qualifier: Diagnosis of  By: Linna Darner MD, Gwyndolyn Saxon    . Carotid stenosis    s/p L CEA  . Cataract, nuclear 03/21/2014  . 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 Cervical spinal stenosis   . Cervical stenosis of spine   . Critical lower limb ischemia   . Degeneration macular 11/02/2011  . Depression    Dr Albertine Patricia  . Difficulty in walking(719.7) 04/05/2011  . ESOPHAGITIS 08/06/2002   Qualifier: Diagnosis of  By: Talbert Cage CMA ([place]), June    . Essential and other specified forms of tremor 04/18/2013  . Fall at home 01/06/2017   left hip pain  . Falls   . Family history of colon cancer 07/24/2012  . Fecal impaction ([place]) 10/11/2012  . Femoral hernia, bilateral s/p lap repair 01/16/2013 01/16/2013  . Glaucoma, compensated 03/21/2014  . H/O cardiovascular stress test    a. 02/2003 -> no ischemia/infarct.  Marland Kitchen HAMMER TOE 04/23/2010   Qualifier: Diagnosis of  By: Linna Darner MD, Gwyndolyn Saxon    .  Hematoma of leg   . Hip hematoma, right 09/09/2013  . History of shingles 2009  . HTN (hypertension) 08/22/2012  . Hx of echocardiogram    Echocardiogram 08/01/12: Mild LVH, EF 55-60%, normal wall motion.  . Hydrocele 10/11/2011  . Hyperlipidemia   . 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 Hypothyroidism   . Macular degeneration disease   . Macular edema   . Migraines    Dr Jannifer Franklin  . MORTON'S NEUROMA, LEFT 08/22/2007   Qualifier: Diagnosis of  By: Linna Darner MD, Gwyndolyn Saxon     . PAD (peripheral artery disease) ([place])   . Peripheral neuropathy   . Personal history of colonic polyps 01/05/2013   2014 2 mm adenomatous polyp   . PREMATURE ATRIAL CONTRACTIONS 03/14/2006   Qualifier: Diagnosis of  By: Linna Darner MD, Gwendolyn Lima 12/16/2011  . SPINAL STENOSIS 03/14/2006   Qualifier: Diagnosis of  By: Linna Darner MD, William   Cervical spine    . Testosterone deficiency    Dr Lawerance Bach, [facility]  . Unspecified vitamin D deficiency 08/06/2012  . Urge incontinence 10/09/2012  . Urinary tract infection, site not specified 02/13/2013  . Weakness 08/02/2012   Past Surgical History:  Procedure Laterality Date  . APPENDECTOMY  1941  . BREAST CYST EXCISION Left 01/16/2013   Procedure: MASS EXCISION AXILLA;  Surgeon: Adin Hector, MD;  Location: [facility];  Service: General;  Laterality: Left;  . CAROTID ENDARTERECTOMY  2005    L  . CATARACT EXTRACTION Right   . COLONOSCOPY  2014   Dr. Deatra Ina  . EYE SURGERY Right 02/2012   retina  . INGUINAL HERNIA REPAIR Bilateral 01/16/2013   Procedure: LAPAROSCOPIC BILATERAL INGUINAL DIRECT AND INDIRECT, FEMORAL, AND OBTURATOR HERNIAS;  Surgeon: Adin Hector, MD;  Location: [facility];  Service: General;  Laterality: Bilateral;  . INSERTION OF MESH N/A 01/16/2013   Procedure: INSERTION OF MESH;  Surgeon: Adin Hector, MD;  Location: [facility];  Service: General;  Laterality: N/A;  . LUMBAR LAMINECTOMY    . TONSILLECTOMY AND ADENOIDECTOMY    . TOTAL HIP ARTHROPLASTY  2005   L    Allergies  Allergen Reactions  . Latex Rash  . Tape Rash    Paper tape is ok to use     Allergies as of 04/26/2019      Reactions   Latex Rash   Tape Rash   Paper tape is ok to use      Medication List    Notice   This visit is during an admission. Changes to the med list made in this visit will be reflected in the After Visit Summary of the admission.     Review of Systems  Immunization History  Administered Date(s) Administered  . DTaP  11/05/2005  . Hepatitis B August 04, 1932  . Influenza Split 11/12/2011, 11/29/2013  . Influenza Whole 10/30/2007, 11/12/2008, 11/26/2009  . Influenza, High Dose Seasonal PF 12/31/2015, 11/24/2016, 11/30/2018  . Influenza,inj,quad, With Preservative 10/16/2016  . Influenza-Unspecified 11/28/2017  . Moderna SARS-COVID-2 Vaccination 02/19/2019, 03/19/2019  . PPD Test 06/23/2011  . Pneumococcal Conjugate-13 01/14/2016  . Pneumococcal Polysaccharide-23 02/16/1999  . Tdap 01/13/2017  . Varicella 11/09/2005  . Zoster 08/30/2006   Pertinent  Health Maintenance Due  Topic Date Due  . INFLUENZA VACCINE  Completed  . PNA vac Low Risk Adult  Completed   Fall Risk  10/28/2017 10/27/2016 06/09/2016 02/03/2016 10/28/2015  Falls in the past year? Yes Yes Yes Yes Yes  Number falls  in past yr: 1 2 or more 2 or more 2 or more 2 or more  Comment - - - 2 falls in last 2 months -  Injury with Fall? Yes Yes No Yes No  Comment - L hip - scraps and bruises -  Risk Factor Category  - - - - -  Risk for fall due to : - - - - Impaired balance/gait  Follow up - - - - -   Functional Status Survey:    There were no vitals filed for this visit. There is no height or weight on file to calculate BMI. Physical Exam  Labs reviewed: Recent Labs    09/11/18 0000 04/26/19 0320 04/26/19 0534  NA 142 143 143  K 4.1 3.7 3.4*  CL 112 109 114*  CO2 23  --  23  GLUCOSE  --  115* 114*  BUN 34* 36* 38*  CREATININE 1.3 1.10 1.04  CALCIUM 8.4  --  8.2*   Recent Labs    09/11/18 0000 04/26/19 0534  AST 18 24  ALT 12 20  ALKPHOS 44 36*  BILITOT  --  0.6  PROT 5.8 5.4*  ALBUMIN 3.5 2.8*   Recent Labs    11/24/18 0000 11/24/18 0000 04/26/19 0128 04/26/19 0320 04/26/19 0534  WBC 4.1  --  3.2*  --  2.8*  NEUTROABS  --   --  2.3  --  1.9  HGB 13.1*   < > 15.5 14.3 11.9*  HCT 40*   < > 50.0 42.0 38.4*  MCV  --   --  96.9  --  97.5  PLT 140*  --  139*  --  123*   < > = values in this interval not displayed.    Lab Results  Component Value Date   TSH 0.994 04/26/2019   Lab Results  Component Value Date   HGBA1C 5 01/26/2018   Lab Results  Component Value Date   CHOL 146 11/24/2018   HDL 38 11/24/2018   LDLCALC 89 11/24/2018   LDLDIRECT 132.5 09/08/2012   TRIG 91 11/24/2018   CHOLHDL 2.7 11/15/2016    Significant Diagnostic Results in last 30 days:  DG Chest Portable 1 View  Result Date: 04/26/2019 CLINICAL DATA:  Shortness of breath EXAM: PORTABLE CHEST 1 VIEW COMPARISON:  January 10, 2017 FINDINGS: There is no pneumothorax. There is a small left-sided pleural effusion. Bibasilar airspace opacities are noted which are new from prior study. The heart size is stable. There is no acute osseous abnormality. IMPRESSION: New bibasilar airspace opacities and small left-sided pleural effusion. Findings are concerning for pneumonia in the appropriate clinical setting. Electronically Signed   By: Constance Holster M.D.   On: 04/26/2019 01:42    Assessment/Plan No problem-specific Assessment & Plan notes found for this encounter.   Family/ staff Communication:   Labs/tests ordered:     This encounter was created in error - please disregard.

## 2019-04-26 NOTE — ED Notes (Signed)
PATIENT HYGIENE COMPLETED FACE WASHED ALONG WITH HANDS, BRIEIF AND SHEET CHANGED. PATIENT WASCLEANED BUT CHANGED ANYWAY

## 2019-04-27 DIAGNOSIS — R001 Bradycardia, unspecified: Secondary | ICD-10-CM

## 2019-04-27 DIAGNOSIS — E782 Mixed hyperlipidemia: Secondary | ICD-10-CM | POA: Diagnosis not present

## 2019-04-27 DIAGNOSIS — N183 Chronic kidney disease, stage 3 unspecified: Secondary | ICD-10-CM | POA: Diagnosis not present

## 2019-04-27 DIAGNOSIS — L8990 Pressure ulcer of unspecified site, unspecified stage: Secondary | ICD-10-CM | POA: Insufficient documentation

## 2019-04-27 DIAGNOSIS — J189 Pneumonia, unspecified organism: Secondary | ICD-10-CM | POA: Diagnosis not present

## 2019-04-27 LAB — CBC WITH DIFFERENTIAL/PLATELET
Abs Immature Granulocytes: 0.03 10*3/uL (ref 0.00–0.07)
Basophils Absolute: 0 10*3/uL (ref 0.0–0.1)
Basophils Relative: 1 %
Eosinophils Absolute: 0.2 10*3/uL (ref 0.0–0.5)
Eosinophils Relative: 4 %
HCT: 38.3 % — ABNORMAL LOW (ref 39.0–52.0)
Hemoglobin: 12.2 g/dL — ABNORMAL LOW (ref 13.0–17.0)
Immature Granulocytes: 1 %
Lymphocytes Relative: 22 %
Lymphs Abs: 1 10*3/uL (ref 0.7–4.0)
MCH: 30.3 pg (ref 26.0–34.0)
MCHC: 31.9 g/dL (ref 30.0–36.0)
MCV: 95 fL (ref 80.0–100.0)
Monocytes Absolute: 0.5 10*3/uL (ref 0.1–1.0)
Monocytes Relative: 12 %
Neutro Abs: 2.6 10*3/uL (ref 1.7–7.7)
Neutrophils Relative %: 60 %
Platelets: 132 10*3/uL — ABNORMAL LOW (ref 150–400)
RBC: 4.03 MIL/uL — ABNORMAL LOW (ref 4.22–5.81)
RDW: 14.2 % (ref 11.5–15.5)
WBC: 4.3 10*3/uL (ref 4.0–10.5)
nRBC: 0 % (ref 0.0–0.2)

## 2019-04-27 LAB — COMPREHENSIVE METABOLIC PANEL
ALT: 23 U/L (ref 0–44)
AST: 22 U/L (ref 15–41)
Albumin: 2.5 g/dL — ABNORMAL LOW (ref 3.5–5.0)
Alkaline Phosphatase: 34 U/L — ABNORMAL LOW (ref 38–126)
Anion gap: 9 (ref 5–15)
BUN: 36 mg/dL — ABNORMAL HIGH (ref 8–23)
CO2: 22 mmol/L (ref 22–32)
Calcium: 8.3 mg/dL — ABNORMAL LOW (ref 8.9–10.3)
Chloride: 110 mmol/L (ref 98–111)
Creatinine, Ser: 1.07 mg/dL (ref 0.61–1.24)
GFR calc Af Amer: >60 mL/min (ref >60)
GFR calc non Af Amer: >60 mL/min (ref >60)
Glucose, Bld: 95 mg/dL (ref 70–99)
Potassium: 4.4 mmol/L (ref 3.5–5.1)
Sodium: 141 mmol/L (ref 135–145)
Total Bilirubin: 0.6 mg/dL (ref 0.3–1.2)
Total Protein: 5.4 g/dL — ABNORMAL LOW (ref 6.5–8.1)

## 2019-04-27 LAB — STREP PNEUMONIAE URINARY ANTIGEN: Strep Pneumo Urinary Antigen: NEGATIVE

## 2019-04-27 MED ORDER — CEFPODOXIME PROXETIL 200 MG PO TABS: 200.0000 mg | ORAL_TABLET | Freq: Two times a day (BID) | ORAL | Status: AC

## 2019-04-27 MED ORDER — CLONAZEPAM 0.5 MG PO TABS: 0.5000 mg | ORAL_TABLET | Freq: Every day | ORAL | 0 refills | Status: DC

## 2019-04-27 MED ORDER — AZITHROMYCIN 500 MG PO TABS: 500.0000 mg | ORAL_TABLET | Freq: Every day | ORAL | 0 refills | Status: AC

## 2019-04-27 MED ORDER — MUCINEX 600 MG PO TB12: 600.0000 mg | ORAL_TABLET | Freq: Two times a day (BID) | ORAL | 0 refills | Status: DC

## 2019-04-27 NOTE — Progress Notes (Signed)
[name] mentioned that pt's glasses were missing. Looked in room, glasses were not among pt's belongings. Called to [employer]. Awaiting phone call back.

## 2019-04-27 NOTE — Discharge Summary (Signed)
Physician Discharge Summary   Patient ID: Jose Leach MRN: [id number] DOB/AGE: March 13, 1932 86 y.o.  Admit date: 04/26/2019 Discharge date: 04/27/2019  Primary Care Physician:  [name], MD   Recommendations for Outpatient Follow-up:  1. Follow up with PCP in 1-2 weeks 2. Continue Zithromax 500 mg daily for 5 days, Vantin 200 mg daily for 5 days 3. Continue albuterol nebs treatments 3 times a day and every 4 hours as needed for shortness of breath and wheezing  Home Health: Patient returning back to skilled nursing facility Equipment/Devices:   Discharge Condition: stable CODE STATUS: FULL  Diet recommendation: Heart healthy diet   Discharge Diagnoses:    . CAP (community acquired pneumonia) . PAD (peripheral artery disease) ([place]) . Myelopathy concurrent with and due to spinal stenosis of cervical region [facility]) . Hypothyroidism . Hypertension . HYPERLIPIDEMIA . CKD (chronic kidney disease) stage 3, GFR 30-59 ml/min . Bradycardia, sinus   Consults: None    Allergies:   Allergies  Allergen Reactions  . Latex Rash  . Tape Rash    Paper tape is ok to use      DISCHARGE MEDICATIONS: Allergies as of 04/27/2019      Reactions   Latex Rash   Tape Rash   Paper tape is ok to use      Medication List    STOP taking these medications   predniSONE 10 MG tablet Commonly known as: DELTASONE     TAKE these medications   acetaminophen 500 MG tablet Commonly known as: TYLENOL Take 1,000 mg by mouth 3 (three) times daily. And q6hprn for pain/fever   albuterol (2.5 MG/3ML) 0.083% nebulizer solution Commonly known as: PROVENTIL Take 2.5 mg by nebulization every 6 (six) hours as needed for wheezing or shortness of breath.   amLODipine 5 MG tablet Commonly known as: NORVASC Take 2.5 mg by mouth daily.   ARIPiprazole 2 MG tablet Commonly known as: Abilify Take 1 tablet (2 mg total) by mouth daily.   aspirin 81 MG tablet Take 81 mg by mouth every  morning.   atorvastatin 20 MG tablet Commonly known as: LIPITOR Take 20 mg by mouth daily.   azithromycin 500 MG tablet Commonly known as: Zithromax Take 1 tablet (500 mg total) by mouth daily for 5 days.   B-complex with vitamin C tablet Take 1 tablet by mouth daily.   bimatoprost 0.01 % Soln Commonly known as: LUMIGAN Place 1 drop into both eyes at bedtime.   buPROPion 300 MG 24 hr tablet Commonly known as: WELLBUTRIN XL Take 300 mg by mouth daily. per psychiatry recommendation   cefpodoxime 200 MG tablet Commonly known as: VANTIN Take 1 tablet (200 mg total) by mouth 2 (two) times daily for 5 days.   clonazePAM 0.5 MG tablet Commonly known as: KLONOPIN Take 1 tablet (0.5 mg total) by mouth at bedtime.   erythromycin ophthalmic ointment Place 1 application into both eyes at bedtime.   ICAPS PO Take 1 tablet by mouth daily.   ketoconazole 2 % cream Commonly known as: NIZORAL Apply 1 application topically 2 (two) times daily as needed for irritation. Apply to buttocks.   levothyroxine 50 MCG tablet Commonly known as: SYNTHROID Take 25-50 mcg by mouth See admin instructions. 50 mcg mon, tues, wed, thur, fri, and sat. 25 mcg on Sunday   linaclotide 145 MCG Caps capsule Commonly known as: LINZESS Take 1 capsule (145 mcg total) by mouth daily before breakfast.   loratadine 10 MG tablet Commonly known  as: CLARITIN Take 10 mg by mouth daily.   Melatonin 3 MG Tabs Take 3 mg by mouth at bedtime.   mirtazapine 30 MG tablet Commonly known as: REMERON Take 60 mg by mouth at bedtime.   polyethylene glycol 17 g packet Commonly known as: MIRALAX / GLYCOLAX Take 17 g by mouth daily. Hold for loose stool   Robafen DM Cgh/Chest Congest 10-100 MG/5ML liquid Generic drug: dextromethorphan-guaiFENesin Take 10 mLs by mouth every 6 (six) hours as needed for cough.   sertraline 100 MG tablet Commonly known as: ZOLOFT Take 200 mg by mouth every morning.   Systane 0.4-0.3  % Soln Generic drug: Polyethyl Glycol-Propyl Glycol Apply 1 drop to eye 4 (four) times daily.   tamsulosin 0.4 MG Caps capsule Commonly known as: FLOMAX Take 0.4 mg by mouth daily after supper.   topiramate 25 MG tablet Commonly known as: TOPAMAX Take 25 mg by mouth 2 (two) times daily.   Vitamin D3 25 MCG (1000 UT) Caps Take 1,000 Units by mouth daily.        Brief H and P: For complete details please refer to admission H and P, but in brief patient is 86 year old male with history of gait disorder, essential tremor, hypertension, hyperlipidemia, bradycardia, cervical myelopathy uses walker for ambulation, was found to be increasingly short of breath in the last 2 days prior to admission.  Patient was placed on prednisone, antibiotics, was having productive cough for the last few days. In the ER patient is afebrile not hypoxic but chest x-ray shows bilateral infiltrates with left pleural effusion with patient having persistent productive cough and given the comorbidities will be admitted for further observation for pneumonia  Hospital Course:     CAP (community acquired pneumonia) -Patient presented for shortness of breath, productive coughing.  COVID-19 test was negative.  He had been on prednisone, antibiotics PTA -CT angiogram of the chest showed no pulmonary embolism, small bilateral pleural effusions with heterogeneous airspace opacity in the bilateral lung bases consistent with infection or aspiration.  It also showed some tubular bronchiectasis and perhaps underlying chronic fibrotic interstitial change particularly at the right lung base -Patient was placed on IV Zithromax and Rocephin -SLP evaluation was obtained, patient did not have any aspiration issues -Blood cultures remain negative, urine strep antigen negative -Transition to oral Zithromax, Vantin for 5 more days to complete full course of antibiotics.  Continue Mucinex, Robitussin, albuterol nebs.   Active  Problems:   Hypothyroidism -Continue Synthroid    HYPERLIPIDEMIA, history of PAD -Continue statin    Myelopathy concurrent with and due to spinal stenosis of cervical region (HCC) -Continue pain control    Bradycardia, sinus -Asymptomatic, normal sinus rhythm, on low-dose amlodipine 2.5 mg daily -Outpatient cardiology referral if any symptomatic bradycardia or heart blocks    Hypertension -Continue low-dose amlodipine    CKD (chronic kidney disease) stage 3, GFR 30-59 ml/min -Baseline creatinine 1.0-1.1 -Currently at baseline  Generalized debility -PT evaluation was obtained, close to baseline, return back to skilled nursing facility level of care  Leukopenia thrombocytopenia Likely due to infectious process, improved  Day of Discharge S: Has dementia, otherwise no acute issues overnight, no fevers or chills.  Denies any chest pain.  BP (!) 149/67 (BP Location: Left Arm)   Pulse (!) 51   Temp 98.1 F (36.7 C) (Oral)   Resp 19   Ht 5\' 10"  (1.778 m)   Wt 60.6 kg   SpO2 100%   BMI 19.17 kg/m  Physical Exam: General: Alert and awake oriented x 2, NAD. HEENT: anicteric sclera, pupils reactive to light and accommodation CVS: S1-S2 clear no murmur rubs or gallops Chest: Decreased breath sound at the bases Abdomen: soft nontender, nondistended, normal bowel sounds Extremities: no cyanosis, clubbing or edema noted bilaterally Neuro: Moving all 4 extremities   Get Medicines reviewed and adjusted: Please take all your medications with you for your next visit with your Primary MD  Please request your Primary MD to go over all hospital tests and procedure/radiological results at the follow up. Please ask your Primary MD to get all Hospital records sent to his/her office.  If you experience worsening of your admission symptoms, develop shortness of breath, life threatening emergency, suicidal or homicidal thoughts you must seek medical attention immediately by calling 911  or calling your MD immediately  if symptoms less severe.  You must read complete instructions/literature along with all the possible adverse reactions/side effects for all the Medicines you take and that have been prescribed to you. Take any new Medicines after you have completely understood and accept all the possible adverse reactions/side effects.   Do not drive when taking pain medications.   Do not take more than prescribed Pain, Sleep and Anxiety Medications  Special Instructions: If you have smoked or chewed Tobacco  in the last 2 yrs please stop smoking, stop any regular Alcohol  and or any Recreational drug use.  Wear Seat belts while driving.  Please note  You were cared for by a hospitalist during your hospital stay. Once you are discharged, your primary care physician will handle any further medical issues. Please note that NO REFILLS for any discharge medications will be authorized once you are discharged, as it is imperative that you return to your primary care physician (or establish a relationship with a primary care physician if you do not have one) for your aftercare needs so that they can reassess your need for medications and monitor your lab values.   The results of significant diagnostics from this hospitalization (including imaging, microbiology, ancillary and laboratory) are listed below for reference.      Procedures/Studies:  CT ANGIO CHEST PE W OR WO CONTRAST  Result Date: 04/26/2019 CLINICAL DATA:  Shortness of breath, elevated D-dimer, rule out PE EXAM: CT ANGIOGRAPHY CHEST WITH CONTRAST TECHNIQUE: Multidetector CT imaging of the chest was performed using the standard protocol during bolus administration of intravenous contrast. Multiplanar CT image reconstructions and MIPs were obtained to evaluate the vascular anatomy. CONTRAST:  141mL OMNIPAQUE IOHEXOL 350 MG/ML SOLN COMPARISON:  None. FINDINGS: Cardiovascular: Satisfactory opacification of the pulmonary  arteries to the segmental level. No evidence of pulmonary embolism. Normal heart size. Left coronary artery calcifications. No pericardial effusion. Mediastinum/Nodes: No enlarged mediastinal, hilar, or axillary lymph nodes. Frothy debris in the upper trachea. Thyroid gland and esophagus demonstrate no significant findings. Lungs/Pleura: Small bilateral pleural effusions. Heterogeneous airspace opacity of the bilateral lung bases, with some tubular bronchiectasis and perhaps chronic fibrotic interstitial change, particularly in the right lung base. Upper Abdomen: No acute abnormality. Musculoskeletal: No chest wall abnormality. No acute or significant osseous findings. Review of the MIP images confirms the above findings. IMPRESSION: 1.  Negative examination for pulmonary embolism. 2. Small bilateral pleural effusions with heterogeneous airspace opacity of the bilateral lung bases, consistent with infection or aspiration. 3. There is some tubular bronchiectasis and perhaps underlying chronic fibrotic interstitial change, particularly in the right lung base. These findings could be due to fibrotic interstitial  lung disease or sequelae of infection/aspiration. Consider follow-up ILD protocol CT examination of the chest at resolution of acute illness and pleural effusions to further assess. 4.  Coronary artery disease. Electronically Signed   By: Eddie Candle M.D.   On: 04/26/2019 10:18   DG Chest Portable 1 View  Result Date: 04/26/2019 CLINICAL DATA:  Shortness of breath EXAM: PORTABLE CHEST 1 VIEW COMPARISON:  January 10, 2017 FINDINGS: There is no pneumothorax. There is a small left-sided pleural effusion. Bibasilar airspace opacities are noted which are new from prior study. The heart size is stable. There is no acute osseous abnormality. IMPRESSION: New bibasilar airspace opacities and small left-sided pleural effusion. Findings are concerning for pneumonia in the appropriate clinical setting. Electronically  Signed   By: Constance Holster M.D.   On: 04/26/2019 01:42       LAB RESULTS: Basic Metabolic Panel: Recent Labs  Lab 04/26/19 0534 04/27/19 0504  NA 143 141  K 3.4* 4.4  CL 114* 110  CO2 23 22  GLUCOSE 114* 95  BUN 38* 36*  CREATININE 1.04 1.07  CALCIUM 8.2* 8.3*   Liver Function Tests: Recent Labs  Lab 04/26/19 0534 04/27/19 0504  AST 24 22  ALT 20 23  ALKPHOS 36* 34*  BILITOT 0.6 0.6  PROT 5.4* 5.4*  ALBUMIN 2.8* 2.5*   No results for input(s): LIPASE, AMYLASE in the last 168 hours. No results for input(s): AMMONIA in the last 168 hours. CBC: Recent Labs  Lab 04/26/19 0534 04/26/19 0534 04/27/19 0504  WBC 2.8*  --  4.3  NEUTROABS 1.9   < > 2.6  HGB 11.9*  --  12.2*  HCT 38.4*  --  38.3*  MCV 97.5   < > 95.0  PLT 123*  --  132*   < > = values in this interval not displayed.   Cardiac Enzymes: No results for input(s): CKTOTAL, CKMB, CKMBINDEX, TROPONINI in the last 168 hours. BNP: Invalid input(s): POCBNP CBG: No results for input(s): GLUCAP in the last 168 hours.     Disposition and Follow-up: Discharge Instructions    Diet - low sodium heart healthy   Complete by: As directed    Increase activity slowly   Complete by: As directed        DISPOSITION: Return to skilled nursing facility   [facility]    [name], MD. Schedule an appointment as soon as possible for a visit in 2 week(s).   Specialty: Internal Medicine Contact information: [place] [address] [phone]            Time coordinating discharge:  35 minutes  Signed:   Estill Cotta M.D. [facility] 04/27/2019, 1:41 PM

## 2019-04-27 NOTE — Care Management Obs Status (Signed)
[place] NOTIFICATION   Patient Details  Name: Jose Leach MRN: [id number] Date of Birth: [birth year]   Medicare Observation Status Notification Given:  No(Patient unable to sign-dementia.  Left message for wife on contact number listed.  No call back.)    Trish Mage, LCSW 04/27/2019, 1:34 PM

## 2019-04-27 NOTE — Progress Notes (Addendum)
Clinical Impression:  This 86 year old man was admitted with increasing weakness.  He is from SNF. Unsure if information he provided is accurate.  During conversation, he was difficult to follow at times and sometimes left pertinent information out. He reports he was mod I using w/c and walker. He now needs mod/max A to roll and max +2 to stand. He performs grooming and self feeding with set up, min to mod A for UB adls and total A for LB adls from a bed level.   Plan is for him to return to SNF today.  He has not been seen by OT for several years, per chart review.  Recommend continued OT at SNF to maximize participation/independence.  Pt does have an essential tremor, but he is able to use arms functionally.    04/27/19 1400  OT Visit Information  Last OT Received On 04/27/19  Assistance Needed +2  History of Present Illness FRITZ CAUTHON is a 86 y.o. male with history of gait disorder essential tremor hypertension hyperlipidemia history of bradycardia with cervical myelopathy uses walker to walk was found to be increasingly short of the last 2 days was placed on prednisone antibiotics and patient states he has been having productive cough for last few days.  Denies chest pain fever or chills.  Patient states he has been feeling increasingly weak last few days.  No vomiting diarrhea abdominal pain.  Precautions  Precautions Fall  Restrictions  Weight Bearing Restrictions No  Home Living  Family/patient expects to be discharged to: Skilled nursing facility  Prior Function  Comments unsure of PLOF.  Pt got lost in thought; ? accuracy of information provided  Communication  Communication  (tremulous quality to voice)  Pain Assessment  Pain Assessment No/denies pain  Cognition  Arousal/Alertness Awake/alert  Behavior During Therapy [facility] for tasks assessed/performed  General Comments unsure of baseline.  Pt follows commands, gets lost in thought when communicating and pertinent details to  follow him were missing.  Reported PLOF does not seem correct (states he was mod I with w/c and needs max +2 to stand)  Upper Extremity Assessment  Upper Extremity Assessment Generalized weakness (shoulders 3/5; grips 4/5)  ADL  Overall ADL's  Needs assistance/impaired  Eating/Feeding Set up  Grooming Wash/dry hands;Set up  Upper Body Bathing Minimal assistance  Lower Body Bathing Total assistance  Upper Body Dressing  Moderate assistance  Lower Body Dressing Total assistance  General ADL Comments rolled to bil sides with mod/max A using bedrails  Bed Mobility  General bed mobility comments rolled to bil sides with mod/max A using bedrails  Transfers  General transfer comment did not perform in OT.  PT assisted with standing at max +2 level  OT - End of Session  Activity Tolerance Patient limited by fatigue  Patient left in bed;with call bell/phone within reach;with bed alarm set  OT Assessment  OT Recommendation/Assessment All further OT needs can be met in the next venue of care  OT Visit Diagnosis Muscle weakness (generalized) (M62.81)  OT Problem List Decreased strength;Decreased activity tolerance;Decreased cognition;Impaired balance (sitting and/or standing) (balance NT; zero during PT)  AM-PAC OT "6 Clicks" Daily Activity Outcome Measure (Version 2)  Help from another person eating meals? 3  Help from another person taking care of personal grooming? 3  Help from another person toileting, which includes using toliet, bedpan, or urinal? 1  Help from another person bathing (including washing, rinsing, drying)? 2  Help from another person to put  on and taking off regular upper body clothing? 2  Help from another person to put on and taking off regular lower body clothing? 1  6 Click Score 12  OT Recommendation  Follow Up Recommendations SNF  OT Equipment None recommended by OT  Individuals Consulted  Consulted and Agree with Results and Recommendations Patient unable/family or  caregiver not available  Acute Rehab OT Goals  OT Goal Formulation All assessment and education complete, DC therapy  OT Time Calculation  OT Start Time (ACUTE ONLY) 1427  OT Stop Time (ACUTE ONLY) 1448  OT Time Calculation (min) 21 min  OT General Charges  $OT Visit 1 Visit  OT Evaluation  $OT Eval Low Complexity 1 Low  Written Expression  Dominant Hand Left during OT eval  Marialena Wollen S, OTR/L Acute Rehabilitation Services 04/27/2019

## 2019-04-27 NOTE — TOC Initial Note (Addendum)
Transition of Care [facility]) - Initial/Assessment Note    Patient Details  Name: Jose Leach MRN: [id number] Date of Birth: [birth year]  Transition of Care [facility]) CM/SW Contact:    Trish Mage, LCSW Phone Number: 04/27/2019, 9:30 AM  Clinical Narrative:  Spoke with Ms Consuela Mimes at [facility] as patient is a SNF resident there.  Based on recommendation of PT, we may or may not submit authorization request for rehab.  Standing by...  FL2 started, completed.  Likely d/c later today depending on recommendations of PT, SLP.  TOC will continue to follow during the course of hospitalization.  Addendum:  Sent in request for authorization of rehab services after completion of PT note.             Addendum II:  D/C order in. PTAR arranged.  Nursing, please call report to [phone].  TOC sign off.  Expected Discharge Plan: Skilled Nursing Facility Barriers to Discharge: No Barriers Identified   Patient Goals and CMS Choice        Expected Discharge Plan and Services Expected Discharge Plan: [place]   Discharge Planning Services: CM Consult   Living arrangements for the past 2 months: [place]                                      Prior Living Arrangements/Services Living arrangements for the past 2 months: [place] Lives with:: Facility Resident              Current home services: DME    Activities of Daily Living Home Assistive Devices/Equipment: Eyeglasses, Grab bars around toilet, Grab bars in shower, Hand-held shower hose, Environmental consultant (specify type), Nebulizer, Shower chair with back, Other (Comment)(walk-in shower, 4 wheeled walker) ADL Screening (condition at time of admission) Patient's cognitive ability adequate to safely complete daily activities?: Yes Is the patient deaf or have difficulty hearing?: Yes Does the patient have difficulty seeing, even when wearing glasses/contacts?: No Does the patient  have difficulty concentrating, remembering, or making decisions?: Yes Patient able to express need for assistance with ADLs?: Yes Does the patient have difficulty dressing or bathing?: Yes Independently performs ADLs?: No Communication: Independent Dressing (OT): Needs assistance Is this a change from baseline?: Pre-admission baseline Grooming: Needs assistance Is this a change from baseline?: Pre-admission baseline Feeding: Independent Bathing: Needs assistance Is this a change from baseline?: Pre-admission baseline Toileting: Needs assistance Is this a change from baseline?: Pre-admission baseline In/Out Bed: Needs assistance Is this a change from baseline?: Pre-admission baseline Walks in Home: Needs assistance Is this a change from baseline?: Pre-admission baseline Does the patient have difficulty walking or climbing stairs?: Yes Weakness of Legs: Both Weakness of Arms/Hands: None  Permission Sought/Granted                  Emotional Assessment              Admission diagnosis:  CAP (community acquired pneumonia) [J18.9] Community acquired pneumonia, unspecified laterality [J18.9] Person under investigation for COVID-19 [Z20.822] Patient Active Problem List   Diagnosis Date Noted  . Pressure injury of skin 04/27/2019  . CAP (community acquired pneumonia) 04/26/2019  . Generalized osteoarthritis of multiple sites 01/23/2019  . Senile angioma 07/04/2018  . Allergic rhinitis 06/13/2018  . Weight loss 04/04/2018  . Skin irritation 04/04/2018  . Acute left hemiparesis ([place]) 12/25/2017  . Benign  hypertensive heart and kidney disease with chronic kidney disease 12/25/2017  . Hip fracture, left, sequela 03/17/2017  . Closed nondisplaced fracture of left acetabulum ([place]) 01/17/2017  . Severe protein-calorie malnutrition ([place]) 01/17/2017  . Normocytic anemia 01/10/2017  . Major psychotic depression, recurrent ([place]) 11/17/2016  . CKD (chronic kidney disease) stage 3,  GFR 30-59 ml/min 11/17/2016  . Callus 07/23/2014  . Fall 07/09/2014  . Pain in left hip 05/22/2014  . AMD (age-related macular degeneration), wet ([place]) 03/21/2014  . Glaucoma, compensated 03/21/2014  . Benign prostatic hyperplasia with urinary obstruction 12/27/2013  . Chronic conjunctivitis 10/02/2013  . Knee pain, right 09/25/2013  . Other testicular hypofunction 09/14/2013  . Benign essential tremor 04/18/2013  . Renal cyst, left 01/30/2013  . Bilateral inguinal hernia 10/09/2012  . Urge incontinence 10/09/2012  . Bilateral leg edema 09/13/2012  . Hypertension 08/22/2012  . Unspecified vitamin D deficiency 08/06/2012  . Bradycardia, sinus 08/02/2012  . Chronic constipation 07/24/2012  . Bladder neck obstruction 10/11/2011  . Hydrocele in adult 10/11/2011  . Hammer toe 04/23/2010  . Unsteady gait 11/12/2009  . MORTON'S NEUROMA, LEFT 08/22/2007  . Anxiety state 06/07/2007  . Arthropathy 06/07/2007  . CAROTID ARTERY DISEASE 03/31/2007  . PAD (peripheral artery disease) ([place]) 03/16/2007  . Hypothyroidism 11/10/2006  . HYPERLIPIDEMIA 11/10/2006  . PREMATURE ATRIAL CONTRACTIONS 03/14/2006  . Myelopathy concurrent with and due to spinal stenosis of cervical region ([place]) 03/14/2006  . ESOPHAGITIS 08/06/2002   PCP:  [name], MD Pharmacy:   [place], [place] - [place] [address] [place] [address] Phone: [phone] Fax: [phone]     Social Determinants of Health (SDOH) Interventions    Readmission Risk Interventions No flowsheet data found.

## 2019-04-27 NOTE — NC FL2 (Signed)
[place] LEVEL OF CARE SCREENING TOOL     IDENTIFICATION  Patient Name: Jose Leach Birthdate: [birth year] Sex: male Admission Date (Current Location): 04/26/2019  [facility] and [place] Number:  Herbalist and Address:  [facility],  [place] [address] [place]      Provider Number: [id number]  Attending Physician Name and Address:  Mendel Corning, MD  Relative Name and Phone Number:       Current Level of Care: Hospital Recommended Level of Care: [place] Prior Approval Number:    Date Approved/Denied:   PASRR Number:    Discharge Plan: SNF([facility])    Current Diagnoses: Patient Active Problem List   Diagnosis Date Noted  . Pressure injury of skin 04/27/2019  . CAP (community acquired pneumonia) 04/26/2019  . Generalized osteoarthritis of multiple sites 01/23/2019  . Senile angioma 07/04/2018  . Allergic rhinitis 06/13/2018  . Weight loss 04/04/2018  . Skin irritation 04/04/2018  . Acute left hemiparesis ([place]) 12/25/2017  . 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 Hip fracture, left, sequela 03/17/2017  . Closed nondisplaced fracture of left acetabulum ([place]) 01/17/2017  . Severe protein-calorie malnutrition ([place]) 01/17/2017  . Normocytic anemia 01/10/2017  . Major psychotic depression, recurrent ([place]) 11/17/2016  . CKD (chronic kidney disease) stage 3, GFR 30-59 ml/min 11/17/2016  . Callus 07/23/2014  . Fall 07/09/2014  . Pain in left hip 05/22/2014  . AMD (age-related macular degeneration), wet ([place]) 03/21/2014  . Glaucoma, compensated 03/21/2014  . Benign prostatic hyperplasia with urinary obstruction 12/27/2013  . Chronic conjunctivitis 10/02/2013  . Knee pain, right 09/25/2013  . Other testicular hypofunction 09/14/2013  . Benign essential tremor 04/18/2013  . Renal cyst, left 01/30/2013  . Bilateral inguinal hernia 10/09/2012  .  Urge incontinence 10/09/2012  . Bilateral leg edema 09/13/2012  . Hypertension 08/22/2012  . Unspecified vitamin D deficiency 08/06/2012  . Bradycardia, sinus 08/02/2012  . Chronic constipation 07/24/2012  . Bladder neck obstruction 10/11/2011  . Hydrocele in adult 10/11/2011  . Hammer toe 04/23/2010  . Unsteady gait 11/12/2009  . MORTON'S NEUROMA, LEFT 08/22/2007  . Anxiety state 06/07/2007  . Arthropathy 06/07/2007  . CAROTID ARTERY DISEASE 03/31/2007  . PAD (peripheral artery disease) ([place]) 03/16/2007  . Hypothyroidism 11/10/2006  . HYPERLIPIDEMIA 11/10/2006  . PREMATURE ATRIAL CONTRACTIONS 03/14/2006  . Myelopathy concurrent with and due to spinal stenosis of cervical region ([place]) 03/14/2006  . ESOPHAGITIS 08/06/2002    Orientation RESPIRATION BLADDER Height & Weight     Self  Normal External catheter Weight: 60.6 kg Height:  5\' 10"  (177.8 cm)  BEHAVIORAL SYMPTOMS/MOOD NEUROLOGICAL BOWEL NUTRITION STATUS  (none) (none) Incontinent Diet(see d/c summary)  AMBULATORY STATUS COMMUNICATION OF NEEDS Skin   Extensive Assist Verbally PU Stage and Appropriate Care   PU Stage 2 Dressing: Daily([place])                   Personal Care Assistance Level of Assistance  Bathing, Feeding, Dressing Bathing Assistance: Maximum assistance Feeding assistance: Independent Dressing Assistance: Maximum assistance     Functional Limitations Info  Sight, Hearing, Speech Sight Info: Adequate Hearing Info: Adequate Speech Info: Adequate    SPECIAL CARE FACTORS FREQUENCY  PT (By licensed PT), OT (By licensed OT)     PT Frequency: 5X/W OT Frequency: 5X/W            Contractures Contractures Info: Not present  Additional Factors Info  Psychotropic Code Status Info: full Allergies Info: Latex, Tape Psychotropic Info: Abilify, Wellbutrin, Remeron, Zoloft         Current Medications (04/27/2019):  This is the current hospital active medication list Current  Facility-Administered Medications  Medication Dose Route Frequency Provider Last Rate Last Admin  . acetaminophen (TYLENOL) tablet 650 mg  650 mg Oral Q6H PRN Rise Patience, MD   650 mg at 04/26/19 1810   Or  . acetaminophen (TYLENOL) suppository 650 mg  650 mg Rectal Q6H PRN Rise Patience, MD      . amLODipine (NORVASC) tablet 2.5 mg  2.5 mg Oral Daily Rise Patience, MD   2.5 mg at 04/27/19 0836  . ARIPiprazole (ABILIFY) tablet 2 mg  2 mg Oral Daily Rise Patience, MD   2 mg at 04/27/19 0835  . aspirin EC tablet 81 mg  81 mg Oral q morning - 10a Rise Patience, MD   81 mg at 04/27/19 0836  . atorvastatin (LIPITOR) tablet 20 mg  20 mg Oral Daily Rise Patience, MD   20 mg at 04/27/19 0836  . azithromycin (ZITHROMAX) 500 mg in sodium chloride 0.9 % 250 mL IVPB  500 mg Intravenous Q24H Rise Patience, MD 250 mL/hr at 04/26/19 2245 500 mg at 04/26/19 2245  . buPROPion (WELLBUTRIN XL) 24 hr tablet 300 mg  300 mg Oral Daily Rise Patience, MD   300 mg at 04/27/19 0836  . cefTRIAXone (ROCEPHIN) 2 g in sodium chloride 0.9 % 100 mL IVPB  2 g Intravenous Q24H Rise Patience, MD 200 mL/hr at 04/26/19 2300 2 g at 04/26/19 2300  . clonazePAM (KLONOPIN) tablet 0.5 mg  0.5 mg Oral QHS Rise Patience, MD   0.5 mg at 04/26/19 2225  . enoxaparin (LOVENOX) injection 40 mg  40 mg Subcutaneous Q24H Rise Patience, MD   40 mg at 04/27/19 0836  . latanoprost (XALATAN) 0.005 % ophthalmic solution 1 drop  1 drop Both Eyes QHS Rise Patience, MD   1 drop at 04/26/19 2300  . [START ON 04/29/2019] levothyroxine (SYNTHROID) tablet 25 mcg  25 mcg Oral Once per day on Sun Kakrakandy, Arshad N, MD      . levothyroxine (SYNTHROID) tablet 50 mcg  50 mcg Oral Once per day on Mon Tue Wed Thu Fri Sat Rise Patience, MD   50 mcg at 04/26/19 0546  . linaclotide (LINZESS) capsule 145 mcg  145 mcg Oral QAC breakfast Rise Patience, MD   145 mcg at  04/27/19 [id number]  . loratadine (CLARITIN) tablet 10 mg  10 mg Oral Daily Rise Patience, MD   10 mg at 04/27/19 0835  . Melatonin TABS 3 mg  3 mg Oral QHS Rise Patience, MD   3 mg at 04/26/19 2226  . mirtazapine (REMERON) tablet 60 mg  60 mg Oral QHS Rise Patience, MD   60 mg at 04/26/19 2225  . ondansetron (ZOFRAN) tablet 4 mg  4 mg Oral Q6H PRN Rise Patience, MD       Or  . ondansetron [facility]) injection 4 mg  4 mg Intravenous Q6H PRN Rise Patience, MD      . polyethylene glycol (MIRALAX / GLYCOLAX) packet 17 g  17 g Oral Daily Rise Patience, MD   17 g at 04/27/19 0836  . sertraline (ZOLOFT) tablet 200 mg  200 mg Oral Daily Rise Patience, MD  200 mg at 04/27/19 0835  . tamsulosin (FLOMAX) capsule 0.4 mg  0.4 mg Oral QPC supper Rise Patience, MD   0.4 mg at 04/26/19 1810  . topiramate (TOPAMAX) tablet 25 mg  25 mg Oral BID Rise Patience, MD   25 mg at 04/27/19 [id number]     Discharge Medications: Please see discharge summary for a list of discharge medications.  Relevant Imaging Results:  Relevant Lab Results:   Additional Information SSN: [place], [place]

## 2019-04-27 NOTE — Evaluation (Addendum)
Physical Therapy Evaluation Patient Details Name: Jose Leach MRN: [id number] DOB: [birth year] Today's Date: 04/27/2019   History of Present Illness  Jose Leach is a 86 y.o. male with history of gait disorder essential tremor hypertension hyperlipidemia history of bradycardia with cervical myelopathy uses walker to walk was found to be increasingly short of the last 2 days was placed on prednisone antibiotics and patient states he has been having productive cough for last few days.  Denies chest pain fever or chills.  Patient states he has been feeling increasingly weak last few days.  No vomiting diarrhea abdominal pain.    Clinical Impression  Pt admitted with above diagnosis. Pt slow to respond to questions, but oriented to self, month, year, and knows he is not at home but unsure of location. Pt attempted STS x5 reps with therapist, but unable to achieve upright standing. Stand pivot transfer with 2nd person, but pt continues to be unable to achieve upright standing. Verbal cues for hand placement and sequencing to improve mobility. Pt with occasional coughing during session, but appears weak with mobility, no increased work of breathing. Pt tolerates remaining up in bedside chair, chair alarm on, breakfast tray in front of pt, and call bell in lap. Pt currently with functional limitations due to the deficits listed below (see PT Problem List). Pt will benefit from skilled PT to increase their independence and safety with mobility to allow discharge to the venue listed below.       Follow Up Recommendations SNF    Equipment Recommendations  3in1 (PT)    Recommendations for Other Services OT consult     Precautions / Restrictions Precautions Precautions: Fall Restrictions Weight Bearing Restrictions: No      Mobility  Bed Mobility Overal bed mobility: Needs Assistance Bed Mobility: Supine to Sit     Supine to sit: Min assist     General bed mobility comments:  min assist to upright trunk from flat bed, verbal cues for hand placement, max assist to scoot to EOB  Transfers Overall transfer level: Needs assistance   Transfers: Sit to/from Stand;Stand Pivot Transfers Sit to Stand: Max assist;+2 physical assistance Stand pivot transfers: Max assist;+2 physical assistance       General transfer comment: STS from EOB x5 attempts with therapist, unable to achieve full upright standing; max assist x2 for stand pivot transfer, pt unable to achieve bil hip/knee/thoracic extension with standing  Ambulation/Gait             General Gait Details: unable due to weakness  Stairs            Wheelchair Mobility    Modified Rankin (Stroke Patients Only)       Balance Overall balance assessment: Needs assistance Sitting-balance support: Feet supported;No upper extremity supported Sitting balance-Leahy Scale: Fair Sitting balance - Comments: seated EOB   Standing balance support: During functional activity;Bilateral upper extremity supported Standing balance-Leahy Scale: Zero Standing balance comment: max assist x2, unable to achieve upright supported standing                             Pertinent Vitals/Pain Pain Assessment: No/denies pain    Home Living Family/patient expects to be discharged to:: Skilled nursing facility([facility]/ALF)               Home Equipment: Gilford Rile - 2 wheels;Wheelchair - manual      Prior Function Level of Independence: Independent  with assistive device(s)         Comments: Pt reports he was independent with bathing, dressing, ambulation using RW for short distances and WC for long distances at [place].     Hand Dominance        Extremity/Trunk Assessment   Upper Extremity Assessment Upper Extremity Assessment: Generalized weakness(BUE grossly 3/5)    Lower Extremity Assessment Lower Extremity Assessment: Generalized weakness(BLE grossly  2+/5)    Cervical / Trunk Assessment Cervical / Trunk Assessment: Kyphotic  Communication   Communication: No difficulties(slow to respond, but appropriate)  Cognition Arousal/Alertness: Awake/alert Behavior During Therapy: WFL for tasks assessed/performed Overall Cognitive Status: Within Functional Limits for tasks assessed                                        General Comments      Exercises     Assessment/Plan    PT Assessment Patient needs continued PT services  PT Problem List Decreased strength;Decreased range of motion;Decreased activity tolerance;Decreased balance;Decreased mobility;Decreased knowledge of use of DME       PT Treatment Interventions DME instruction;Gait training;Functional mobility training;Therapeutic activities;Therapeutic exercise;Balance training;Neuromuscular re-education;Patient/family education;Wheelchair mobility training;Manual techniques    PT Goals (Current goals can be found in the Care Plan section)  Acute Rehab PT Goals Patient Stated Goal: get stronger PT Goal Formulation: With patient Time For Goal Achievement: 05/11/19 Potential to Achieve Goals: Fair    Frequency Min 2X/week   Barriers to discharge        Co-evaluation               AM-PAC PT "6 Clicks" Mobility  Outcome Measure Help needed turning from your back to your side while in a flat bed without using bedrails?: A Little Help needed moving from lying on your back to sitting on the side of a flat bed without using bedrails?: A Little Help needed moving to and from a bed to a chair (including a wheelchair)?: Total Help needed standing up from a chair using your arms (e.g., wheelchair or bedside chair)?: Total Help needed to walk in hospital room?: Total Help needed climbing 3-5 steps with a railing? : Total 6 Click Score: 10    End of Session Equipment Utilized During Treatment: Gait belt Activity Tolerance: Patient tolerated treatment  well Patient left: in chair;with call bell/phone within reach;with chair alarm set Nurse Communication: Mobility status(RN assisted in stand pivot transfer) PT Visit Diagnosis: Unsteadiness on feet (R26.81);Other abnormalities of gait and mobility (R26.89);Muscle weakness (generalized) (M62.81)    Time: [id number] PT Time Calculation (min) (ACUTE ONLY): 30 min   Charges:   PT Evaluation $PT Eval Moderate Complexity: 1 Mod PT Treatments $Therapeutic Activity: 8-22 mins         Talbot Grumbling PT, DPT 04/27/19, 12:26 PM [phone]

## 2019-04-29 LAB — LEGIONELLA PNEUMOPHILA SEROGP 1 UR AG: L. pneumophila Serogp 1 Ur Ag: NEGATIVE

## 2019-04-30 ENCOUNTER — Non-Acute Institutional Stay (SKILLED_NURSING_FACILITY): Payer: Medicare Other | Admitting: Nurse Practitioner

## 2019-04-30 DIAGNOSIS — M159 Polyosteoarthritis, unspecified: Secondary | ICD-10-CM

## 2019-04-30 DIAGNOSIS — R634 Abnormal weight loss: Secondary | ICD-10-CM | POA: Diagnosis not present

## 2019-04-30 DIAGNOSIS — I739 Peripheral vascular disease, unspecified: Secondary | ICD-10-CM

## 2019-04-30 DIAGNOSIS — J189 Pneumonia, unspecified organism: Secondary | ICD-10-CM | POA: Diagnosis not present

## 2019-04-30 DIAGNOSIS — G250 Essential tremor: Secondary | ICD-10-CM

## 2019-04-30 DIAGNOSIS — N183 Chronic kidney disease, stage 3 unspecified: Secondary | ICD-10-CM

## 2019-04-30 DIAGNOSIS — F333 Major depressive disorder, recurrent, severe with psychotic symptoms: Secondary | ICD-10-CM | POA: Diagnosis not present

## 2019-04-30 DIAGNOSIS — E039 Hypothyroidism, unspecified: Secondary | ICD-10-CM

## 2019-04-30 DIAGNOSIS — K5909 Other constipation: Secondary | ICD-10-CM

## 2019-04-30 DIAGNOSIS — N3941 Urge incontinence: Secondary | ICD-10-CM

## 2019-04-30 DIAGNOSIS — I1310 Hypertensive heart and chronic kidney disease without heart failure, with stage 1 through stage 4 chronic kidney disease, or unspecified chronic kidney disease: Secondary | ICD-10-CM

## 2019-04-30 NOTE — Progress Notes (Signed)
Location:   [facility] Room Number: 37 Place of Service:  SNF [phone]) Provider: Lennie Odor Abbagayle Zaragoza NP  [name], MD  Patient Care Team: [name], MD as PCP - General (Internal Medicine) [name], MD as Consulting Physician (Urology) [name], MD as Consulting Physician (Neurology) [name], MD as Consulting Physician (Dermatology) [name], MD (Inactive) as Consulting Physician (Gastroenterology) [name], MD as Consulting Physician (Orthopedic Surgery) [name], MD as Consulting Physician (Cardiology) [name], MD as Consulting Physician (Ophthalmology) [name], MD as Referring Physician (Psychiatry) [name], MD as Consulting Physician (General Surgery) [name], MD as Consulting Physician (Orthopedic Surgery) [place], [name], NP as Nurse Practitioner (Family Medicine) Adalberto Metzgar X, NP as Nurse Practitioner (Internal Medicine)  Extended Emergency Contact Information Primary Emergency Contact: Helvey,[name] Address: [address]          [place], [place] [address] [place] Phone: [phone] Relation: Spouse Secondary Emergency Contact: Durwin Glaze Mobile Phone: [phone] Relation: Daughter  Code Status:  DNR Goals of care: Advanced Directive information Advanced Directives 05/01/2019  Does Patient Have a Medical Advance Directive? Yes  Type of Paramedic of [place];Out of facility DNR (pink MOST or yellow form);Living will  Does patient want to make changes to medical advance directive? No - Patient declined  Copy of [place] in Chart? Yes - validated most recent copy scanned in chart (See row information)  Pre-existing out of facility DNR order (yellow form or pink MOST form) Pink MOST/Yellow Form most recent copy in chart - Physician notified to receive inpatient order     Chief  Complaint  Patient presents with  . Medical Management of Chronic Issues    Routine Visit     HPI:  Pt is a 86 y.o. male seen today for an acute visit for medication review  The patient was hospitalized from 04/26/19-04/27/19 for CAP: will complete Zithromax 500mg , Vantin 200mg  qd x 5 days. Albuterol neb tid/q4hr prn, Mucinex 600mg  bid, Loratadine 10mg  qd.   Hx of PAD, chronic neck/lower back pain, on Tylenol 1000mg  tid.  HTN, on Amlodipine 2.5mg  qd.  Hypothyroidism, on Levothyroxine 82mcg qd x6 days/wk, 31mcg qd 1 day/week, CKD. Creat 1.07 04/27/19. Constipation, stable, on Linzess 181mcg qd, MiraLax qd. Urinary incontinence, on Tamsulosin 0.4mg  qd. Essential tremor, stable, on Topamx 25mg  bid.   Hx of depression, under Psych eval, recommended to increase Abilify to 10mg  if needed, currently on Abilify 2mg  qd, Wellburin 300mg  qd, Clonazepam 0.5mg  hs, Mirtazapine 60mg  qd, Sertraline 200mg  qd.    Past Medical History:  Diagnosis Date  . Acute urinary retention 01/28/2013  . AKI (acute kidney injury) ([place]) 01/26/2013  . Altered mental status 09/09/2013  . Anemia    B12 deficiency  . ANXIETY 06/07/2007   Qualifier: Diagnosis of  By: Talbert Cage CMA ([place]), June    . ARTHRITIS 06/07/2007   Qualifier: Diagnosis of  By: Talbert Cage CMA ([place]), June    . Balance problems 04/05/2011  . Benign essential tremor   . Benign fibroma of prostate 10/11/2011  . Bilateral inguinal hernia 10/09/2012  . Bilateral leg edema 09/13/2012  . Bladder retention 02/05/2013  . Bradycardia, sinus 08/02/2012  . CAROTID ARTERY DISEASE 03/31/2007   Qualifier: Diagnosis of  By: Linna Darner MD, Gwyndolyn Saxon    . Carotid stenosis    s/p L CEA  .  Cataract, nuclear 03/21/2014  . 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 Cervical spinal stenosis   . Cervical stenosis of spine   . Critical lower limb ischemia   . Degeneration macular 11/02/2011  . Depression    Dr Albertine Patricia  . Difficulty in walking(719.7) 04/05/2011  . ESOPHAGITIS 08/06/2002    Qualifier: Diagnosis of  By: Talbert Cage CMA ([place]), June    . Essential and other specified forms of tremor 04/18/2013  . Fall at home 01/06/2017   left hip pain  . Falls   . Family history of colon cancer 07/24/2012  . Fecal impaction ([place]) 10/11/2012  . Femoral hernia, bilateral s/p lap repair 01/16/2013 01/16/2013  . Glaucoma, compensated 03/21/2014  . H/O cardiovascular stress test    a. 02/2003 -> no ischemia/infarct.  Marland Kitchen HAMMER TOE 04/23/2010   Qualifier: Diagnosis of  By: Linna Darner MD, Gwyndolyn Saxon    . Hematoma of leg   . Hip hematoma, right 09/09/2013  . History of shingles 2009  . HTN (hypertension) 08/22/2012  . Hx of echocardiogram    Echocardiogram 08/01/12: Mild LVH, EF 55-60%, normal wall motion.  . Hydrocele 10/11/2011  . Hyperlipidemia   . 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 Hypothyroidism   . Macular degeneration disease   . Macular edema   . Migraines    Dr Jannifer Franklin  . MORTON'S NEUROMA, LEFT 08/22/2007   Qualifier: Diagnosis of  By: Linna Darner MD, Gwyndolyn Saxon    . PAD (peripheral artery disease) ([place])   . Peripheral neuropathy   . Personal history of colonic polyps 01/05/2013   2014 2 mm adenomatous polyp   . PREMATURE ATRIAL CONTRACTIONS 03/14/2006   Qualifier: Diagnosis of  By: Linna Darner MD, Gwendolyn Lima 12/16/2011  . SPINAL STENOSIS 03/14/2006   Qualifier: Diagnosis of  By: Linna Darner MD, William   Cervical spine    . Testosterone deficiency    Dr Lawerance Bach, [facility]  . Unspecified vitamin D deficiency 08/06/2012  . Urge incontinence 10/09/2012  . Urinary tract infection, site not specified 02/13/2013  . Weakness 08/02/2012   Past Surgical History:  Procedure Laterality Date  . APPENDECTOMY  1941  . BREAST CYST EXCISION Left 01/16/2013   Procedure: MASS EXCISION AXILLA;  Surgeon: Adin Hector, MD;  Location: [facility];  Service: General;  Laterality: Left;  . CAROTID ENDARTERECTOMY  2005    L  . CATARACT EXTRACTION Right   . COLONOSCOPY  2014   Dr. Deatra Ina  . EYE SURGERY Right  02/2012   retina  . INGUINAL HERNIA REPAIR Bilateral 01/16/2013   Procedure: LAPAROSCOPIC BILATERAL INGUINAL DIRECT AND INDIRECT, FEMORAL, AND OBTURATOR HERNIAS;  Surgeon: Adin Hector, MD;  Location: [facility];  Service: General;  Laterality: Bilateral;  . INSERTION OF MESH N/A 01/16/2013   Procedure: INSERTION OF MESH;  Surgeon: Adin Hector, MD;  Location: [facility];  Service: General;  Laterality: N/A;  . LUMBAR LAMINECTOMY    . TONSILLECTOMY AND ADENOIDECTOMY    . TOTAL HIP ARTHROPLASTY  2005   L    Allergies  Allergen Reactions  . Latex Rash  . Tape Rash    Paper tape is ok to use     Allergies as of 04/30/2019      Reactions   Latex Rash   Tape Rash   Paper tape is ok to use      Medication List       Accurate as of April 30, 2019 11:59 PM. If you have any questions, ask your  nurse or doctor.        STOP taking these medications   Mucinex 600 MG 12 hr tablet Generic drug: guaiFENesin     TAKE these medications   acetaminophen 500 MG tablet Commonly known as: TYLENOL Take 1,000 mg by mouth 3 (three) times daily. And q6hprn for pain/fever   albuterol (2.5 MG/3ML) 0.083% nebulizer solution Commonly known as: PROVENTIL Take 2.5 mg by nebulization every 6 (six) hours as needed for wheezing or shortness of breath.   amLODipine 5 MG tablet Commonly known as: NORVASC Take 2.5 mg by mouth daily.   ARIPiprazole 2 MG tablet Commonly known as: Abilify Take 1 tablet (2 mg total) by mouth daily.   aspirin 81 MG tablet Take 81 mg by mouth every morning.   atorvastatin 20 MG tablet Commonly known as: LIPITOR Take 20 mg by mouth daily.   azithromycin 500 MG tablet Commonly known as: Zithromax Take 1 tablet (500 mg total) by mouth daily for 5 days.   B-complex with vitamin C tablet Take 1 tablet by mouth daily.   bimatoprost 0.01 % Soln Commonly known as: LUMIGAN Place 1 drop into both eyes at bedtime.   buPROPion 300 MG 24 hr tablet Commonly known as:  WELLBUTRIN XL Take 300 mg by mouth daily. per psychiatry recommendation   cefpodoxime 200 MG tablet Commonly known as: VANTIN Take 1 tablet (200 mg total) by mouth 2 (two) times daily for 5 days.   clonazePAM 0.5 MG tablet Commonly known as: KLONOPIN Take 1 tablet (0.5 mg total) by mouth at bedtime.   erythromycin ophthalmic ointment Place 1 application into both eyes at bedtime.   ICAPS PO Take 1 tablet by mouth daily.   ketoconazole 2 % cream Commonly known as: NIZORAL Apply 1 application topically 2 (two) times daily as needed for irritation. Apply to buttocks.   levothyroxine 50 MCG tablet Commonly known as: SYNTHROID Take 25-50 mcg by mouth See admin instructions. 50 mcg mon, tues, wed, thur, fri, and sat. 25 mcg on Sunday   levothyroxine 50 MCG tablet Commonly known as: SYNTHROID Take 50 mcg by mouth every morning. 7:30am on Monday, Tuesday, Wednesday, Thursday, Friday, and Saturday.   linaclotide 145 MCG Caps capsule Commonly known as: LINZESS Take 1 capsule (145 mcg total) by mouth daily before breakfast.   loratadine 10 MG tablet Commonly known as: CLARITIN Take 10 mg by mouth daily.   Melatonin 3 MG Tabs Take 3 mg by mouth at bedtime.   mirtazapine 30 MG tablet Commonly known as: REMERON Take 60 mg by mouth at bedtime.   polyethylene glycol 17 g packet Commonly known as: MIRALAX / GLYCOLAX Take 17 g by mouth daily. Hold for loose stool   Robafen DM Cgh/Chest Congest 10-100 MG/5ML liquid Generic drug: dextromethorphan-guaiFENesin Take 10 mLs by mouth every 6 (six) hours as needed for cough.   sertraline 100 MG tablet Commonly known as: ZOLOFT Take 100 mg by mouth 2 (two) times daily. In the morning. 200mg  in Total.   Systane 0.4-0.3 % Soln Generic drug: Polyethyl Glycol-Propyl Glycol Apply 1 drop to eye 4 (four) times daily.   tamsulosin 0.4 MG Caps capsule Commonly known as: FLOMAX Take 0.4 mg by mouth daily after supper.   topiramate 25 MG  tablet Commonly known as: TOPAMAX Take 25 mg by mouth 2 (two) times daily.   Vitamin D3 25 MCG (1000 UT) Caps Take 1,000 Units by mouth daily.      ROS was provided with assistance of  staff.  Review of Systems  Constitutional: Positive for appetite change and fatigue. Negative for activity change, chills, diaphoresis and fever.  HENT: Positive for hearing loss. Negative for congestion and voice change.   Eyes: Positive for visual disturbance.  Respiratory: Positive for cough. Negative for shortness of breath and wheezing.   Cardiovascular: Negative for chest pain, palpitations and leg swelling.  Gastrointestinal: Negative for abdominal distention, abdominal pain, constipation, nausea and vomiting.  Genitourinary: Negative for difficulty urinating, dysuria and urgency.       Incontinent of urine.   Musculoskeletal: Positive for arthralgias and gait problem.  Skin: Negative for color change and pallor.  Neurological: Positive for tremors. Negative for dizziness, speech difficulty, weakness and headaches.       Memory lapses. Fine tremor in fingers.   Psychiatric/Behavioral: Positive for dysphoric mood. Negative for agitation, behavioral problems, hallucinations and sleep disturbance. The patient is not nervous/anxious.        ? Paranoid delusions    Immunization History  Administered Date(s) Administered  . DTaP 11/05/2005  . Hepatitis B March 08, 1932  . Influenza Split 11/12/2011, 11/29/2013  . Influenza Whole 10/30/2007, 11/12/2008, 11/26/2009  . Influenza, High Dose Seasonal PF 12/31/2015, 11/24/2016, 11/30/2018  . Influenza,inj,quad, With Preservative 10/16/2016  . Influenza-Unspecified 11/28/2017  . Moderna SARS-COVID-2 Vaccination 02/19/2019, 03/19/2019  . PPD Test 06/23/2011  . Pneumococcal Conjugate-13 01/14/2016  . Pneumococcal Polysaccharide-23 02/16/1999  . Tdap 01/13/2017  . Varicella 11/09/2005  . Zoster 08/30/2006   Pertinent  Health Maintenance Due  Topic Date  Due  . INFLUENZA VACCINE  Completed  . PNA vac Low Risk Adult  Completed   Fall Risk  10/28/2017 10/27/2016 06/09/2016 02/03/2016 10/28/2015  Falls in the past year? Yes Yes Yes Yes Yes  Number falls in past yr: 1 2 or more 2 or more 2 or more 2 or more  Comment - - - 2 falls in last 2 months -  Injury with Fall? Yes Yes No Yes No  Comment - L hip - scraps and bruises -  Risk Factor Category  - - - - -  Risk for fall due to : - - - - Impaired balance/gait  Follow up - - - - -   Functional Status Survey:    Vitals:   05/01/19 1409  BP: 112/70  Pulse: 64  Resp: 16  Temp: 97.6 F (36.4 C)  SpO2: 97%  Weight: 137 lb (62.1 kg)  Height: 5\' 10"  (1.778 m)   Body mass index is 19.66 kg/m. Physical Exam Vitals and nursing note reviewed.  Constitutional:      General: He is not in acute distress.    Appearance: Normal appearance. He is not ill-appearing.  HENT:     Head: Normocephalic and atraumatic.     Nose: Nose normal.     Mouth/Throat:     Mouth: Mucous membranes are moist.  Eyes:     Extraocular Movements: Extraocular movements intact.     Conjunctiva/sclera: Conjunctivae normal.     Pupils: Pupils are equal, round, and reactive to light.     Comments: Left eye blind.   Cardiovascular:     Rate and Rhythm: Normal rate and regular rhythm.     Heart sounds: No murmur.  Pulmonary:     Effort: Pulmonary effort is normal.     Breath sounds: No wheezing, rhonchi or rales.  Chest:     Chest wall: No tenderness.  Abdominal:     General: Bowel sounds are normal. There is  no distension.     Palpations: Abdomen is soft.     Tenderness: There is no abdominal tenderness. There is no right CVA tenderness, left CVA tenderness, guarding or rebound.  Musculoskeletal:     Cervical back: Normal range of motion and neck supple.     Right lower leg: No edema.     Left lower leg: No edema.  Skin:    General: Skin is warm and dry.  Neurological:     General: No focal deficit present.      Mental Status: He is alert. Mental status is at baseline.     Motor: No weakness.     Coordination: Coordination normal.     Gait: Gait abnormal.     Comments: Oriented to person, place. Fine, mild resting tremor in fingers, not disabling.   Psychiatric:        Mood and Affect: Mood normal.        Behavior: Behavior normal.     Labs reviewed: Recent Labs    09/11/18 0000 09/11/18 0000 04/26/19 0320 04/26/19 0534 04/27/19 0504  NA 142  --  143 143 141  K 4.1   < > 3.7 3.4* 4.4  CL 112  --  109 114* 110  CO2 23  --   --  23 22  GLUCOSE  --   --  115* 114* 95  BUN 34*  --  36* 38* 36*  CREATININE 1.3  --  1.10 1.04 1.07  CALCIUM 8.4  --   --  8.2* 8.3*   < > = values in this interval not displayed.   Recent Labs    09/11/18 0000 04/26/19 0534 04/27/19 0504  AST 18 24 22   ALT 12 20 23   ALKPHOS 44 36* 34*  BILITOT  --  0.6 0.6  PROT 5.8 5.4* 5.4*  ALBUMIN 3.5 2.8* 2.5*   Recent Labs    04/26/19 0128 04/26/19 0128 04/26/19 0320 04/26/19 0534 04/27/19 0504  WBC 3.2*  --   --  2.8* 4.3  NEUTROABS 2.3  --   --  1.9 2.6  HGB 15.5   < > 14.3 11.9* 12.2*  HCT 50.0   < > 42.0 38.4* 38.3*  MCV 96.9  --   --  97.5 95.0  PLT 139*  --   --  123* 132*   < > = values in this interval not displayed.   Lab Results  Component Value Date   TSH 0.994 04/26/2019   Lab Results  Component Value Date   HGBA1C 5 01/26/2018   Lab Results  Component Value Date   CHOL 146 11/24/2018   HDL 38 11/24/2018   LDLCALC 89 11/24/2018   LDLDIRECT 132.5 09/08/2012   TRIG 91 11/24/2018   CHOLHDL 2.7 11/15/2016    Significant Diagnostic Results in last 30 days:  CT ANGIO CHEST PE W OR WO CONTRAST  Result Date: 04/26/2019 CLINICAL DATA:  Shortness of breath, elevated D-dimer, rule out PE EXAM: CT ANGIOGRAPHY CHEST WITH CONTRAST TECHNIQUE: Multidetector CT imaging of the chest was performed using the standard protocol during bolus administration of intravenous contrast. Multiplanar  CT image reconstructions and MIPs were obtained to evaluate the vascular anatomy. CONTRAST:  19mL OMNIPAQUE IOHEXOL 350 MG/ML SOLN COMPARISON:  None. FINDINGS: Cardiovascular: Satisfactory opacification of the pulmonary arteries to the segmental level. No evidence of pulmonary embolism. Normal heart size. Left coronary artery calcifications. No pericardial effusion. Mediastinum/Nodes: No enlarged mediastinal, hilar, or axillary lymph nodes. Frothy debris in  the upper trachea. Thyroid gland and esophagus demonstrate no significant findings. Lungs/Pleura: Small bilateral pleural effusions. Heterogeneous airspace opacity of the bilateral lung bases, with some tubular bronchiectasis and perhaps chronic fibrotic interstitial change, particularly in the right lung base. Upper Abdomen: No acute abnormality. Musculoskeletal: No chest wall abnormality. No acute or significant osseous findings. Review of the MIP images confirms the above findings. IMPRESSION: 1.  Negative examination for pulmonary embolism. 2. Small bilateral pleural effusions with heterogeneous airspace opacity of the bilateral lung bases, consistent with infection or aspiration. 3. There is some tubular bronchiectasis and perhaps underlying chronic fibrotic interstitial change, particularly in the right lung base. These findings could be due to fibrotic interstitial lung disease or sequelae of infection/aspiration. Consider follow-up ILD protocol CT examination of the chest at resolution of acute illness and pleural effusions to further assess. 4.  Coronary artery disease. Electronically Signed   By: Eddie Candle M.D.   On: 04/26/2019 10:18   DG Chest Portable 1 View  Result Date: 04/26/2019 CLINICAL DATA:  Shortness of breath EXAM: PORTABLE CHEST 1 VIEW COMPARISON:  January 10, 2017 FINDINGS: There is no pneumothorax. There is a small left-sided pleural effusion. Bibasilar airspace opacities are noted which are new from prior study. The heart size is  stable. There is no acute osseous abnormality. IMPRESSION: New bibasilar airspace opacities and small left-sided pleural effusion. Findings are concerning for pneumonia in the appropriate clinical setting. Electronically Signed   By: Constance Holster M.D.   On: 04/26/2019 01:42    Assessment/Plan CAP (community acquired pneumonia) hospitalized from 04/26/19-04/27/19 for CAP: will complete Zithromax 500mg , Vantin 200mg  qd x 5 days. Albuterol neb tid/q4hr prn, Mucinex 600mg  bid, Loratadine 10mg  qd.    Major psychotic depression, recurrent (HCC) Hx of depression, under Psych eval, recommended to increase Abilify to 10mg  if needed, currently and continue Abilify 2mg  qd, Wellburin 300mg  qd, Clonazepam 0.5mg  hs, Mirtazapine 60mg  qd, Sertraline 200mg  qd.    Generalized osteoarthritis of multiple sites chronic neck/lower back pain, continue Tylenol 1000mg  tid.  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   PAD (peripheral artery disease) (HCC) No open wound presently. 04/25/19 ABI R 0.54, L 057 moderate arterial disease    Chronic constipation  stable, continue Linzess 14mcg qd, MiraLax qd.  Hypothyroidism TSH 0.994 04/26/19 continue Levothyroxine 29mcg qd x6 days/wk, 23mcg qd 1 day/week,   CKD (chronic kidney disease) stage 3, GFR 30-59 ml/min 04/27/19 Creat 1.07  Urge incontinence Managed with adult brief, continue Tamsulosin.   Benign essential tremor Stable, continue Topamax.     Family/ staff Communication: plan of care reviewed with the patient and charge nurse.   Labs/tests ordered:  None  Time spend 25 minutes.

## 2019-05-01 ENCOUNTER — Encounter: Payer: Self-pay | Admitting: Nurse Practitioner

## 2019-05-01 LAB — CULTURE, BLOOD (ROUTINE X 2)
Culture: NO GROWTH
Culture: NO GROWTH
Special Requests: ADEQUATE
Special Requests: ADEQUATE

## 2019-05-01 NOTE — Assessment & Plan Note (Addendum)
No open wound presently. 04/25/19 ABI R 0.54, L 057 moderate arterial disease

## 2019-05-01 NOTE — Assessment & Plan Note (Signed)
chronic neck/lower back pain, continue Tylenol 1000mg  tid.

## 2019-05-01 NOTE — Assessment & Plan Note (Signed)
Blood pressure is controlled, continue Amlodipine 2.5mg qd.  

## 2019-05-01 NOTE — Assessment & Plan Note (Signed)
hospitalized from 04/26/19-04/27/19 for CAP: will complete Zithromax 500mg , Vantin 200mg  qd x 5 days. Albuterol neb tid/q4hr prn, Mucinex 600mg  bid, Loratadine 10mg  qd.

## 2019-05-01 NOTE — Assessment & Plan Note (Signed)
04/27/19 Creat 1.07

## 2019-05-01 NOTE — Assessment & Plan Note (Signed)
stable, continue Linzess 111mcg qd, MiraLax qd.

## 2019-05-01 NOTE — Assessment & Plan Note (Signed)
TSH 0.994 04/26/19 continue Levothyroxine 62mcg qd x6 days/wk, 66mcg qd 1 day/week,

## 2019-05-01 NOTE — Assessment & Plan Note (Signed)
Stable, continue Topamax.  

## 2019-05-01 NOTE — Assessment & Plan Note (Signed)
Managed with adult brief, continue Tamsulosin.

## 2019-05-01 NOTE — Assessment & Plan Note (Signed)
Hx of depression, under Psych eval, recommended to increase Abilify to 10mg  if needed, currently and continue Abilify 2mg  qd, Wellburin 300mg  qd, Clonazepam 0.5mg  hs, Mirtazapine 60mg  qd, Sertraline 200mg  qd.

## 2019-05-02 ENCOUNTER — Non-Acute Institutional Stay (SKILLED_NURSING_FACILITY): Payer: Medicare Other | Admitting: Internal Medicine

## 2019-05-02 ENCOUNTER — Encounter: Payer: Self-pay | Admitting: Internal Medicine

## 2019-05-02 DIAGNOSIS — J189 Pneumonia, unspecified organism: Secondary | ICD-10-CM

## 2019-05-02 DIAGNOSIS — I739 Peripheral vascular disease, unspecified: Secondary | ICD-10-CM

## 2019-05-02 DIAGNOSIS — N183 Chronic kidney disease, stage 3 unspecified: Secondary | ICD-10-CM

## 2019-05-02 DIAGNOSIS — R634 Abnormal weight loss: Secondary | ICD-10-CM

## 2019-05-02 DIAGNOSIS — I1310 Hypertensive heart and chronic kidney disease without heart failure, with stage 1 through stage 4 chronic kidney disease, or unspecified chronic kidney disease: Secondary | ICD-10-CM

## 2019-05-02 DIAGNOSIS — E039 Hypothyroidism, unspecified: Secondary | ICD-10-CM

## 2019-05-02 DIAGNOSIS — K5909 Other constipation: Secondary | ICD-10-CM

## 2019-05-02 NOTE — Progress Notes (Signed)
Provider:  [name], MD Location:   [facility] Room Number: 37 Place of Service:  SNF (31)  PCP: [name], MD Patient Care Team: [name], MD as PCP - General (Internal Medicine) [name], MD as Consulting Physician (Urology) [name], MD as Consulting Physician (Neurology) [name], MD as Consulting Physician (Dermatology) [name], MD (Inactive) as Consulting Physician (Gastroenterology) [name], MD as Consulting Physician (Orthopedic Surgery) [name], MD as Consulting Physician (Cardiology) [name], MD as Consulting Physician (Ophthalmology) [name], MD as Referring Physician (Psychiatry) [name], MD as Consulting Physician (General Surgery) [name], MD as Consulting Physician (Orthopedic Surgery) Woodward, [name], NP as Nurse Practitioner (Family Medicine) Mast, Man X, NP as Nurse Practitioner (Internal Medicine)  Extended Emergency Contact Information Primary Emergency Contact: [facility] Address: [address]          [place], [place] [address] [place] Phone: [phone] Relation: Spouse Secondary Emergency Contact: Durwin Glaze Mobile Phone: [phone] Relation: Daughter  Code Status: Full Code Goals of Care: Advanced Directive information Advanced Directives 05/02/2019  Does Patient Have a Medical Advance Directive? Yes  Type of Paramedic of Powers;Living will  Does patient want to make changes to medical advance directive? No - Patient declined  Copy of [place] in Chart? Yes - validated most recent copy scanned in chart (See row information)  Pre-existing out of facility DNR order (yellow form or pink MOST form) Pink MOST form placed in chart (order not valid for inpatient use)      Chief Complaint  Patient presents with  . ReAdmit To SNF   Admission    HPI: Patient is a 86 y.o. male seen today for Readmission to SNF for Long term care and therapy Patient was admitted in the hospital from 3/11-3/12 for bilateral pneumonia He has h/o HTN, Hyperlipidemia, PAD, BPH , CKD stage 2, Hypothyroidism, and Major Depression Also Has h/o Cervial Stenosis with Compressive Myelopathy,   He was having cough and shortness of breath in the nursing facility.  Was started on prednisone because he was wheezing but then he started spiking temperatures and  became lethargic and was sent to the emergency room for further eval X-ray showed bilateral pneumonia.  CT of the chest did not show any PE. Was evaluated by speech in the hospital and did not have any aspiration issues Blood cultures were negative he was changed to oral Zithromax and Vantin and was discharged back to the facility  Patient is back to baseline denies any shortness of breath or cough.  Had no acute issues today. Past Medical History:  Diagnosis Date  . Acute urinary retention 01/28/2013  . AKI (acute kidney injury) ([place]) 01/26/2013  . Altered mental status 09/09/2013  . Anemia    B12 deficiency  . ANXIETY 06/07/2007   Qualifier: Diagnosis of  By: Talbert Cage CMA ([place]), June    . ARTHRITIS 06/07/2007   Qualifier: Diagnosis of  By: Talbert Cage CMA ([place]), June    . Balance problems 04/05/2011  . Benign essential tremor   . Benign fibroma of prostate 10/11/2011  . Bilateral inguinal hernia 10/09/2012  . Bilateral leg edema 09/13/2012  . Bladder retention 02/05/2013  . Bradycardia, sinus 08/02/2012  . CAROTID ARTERY DISEASE 03/31/2007   Qualifier: Diagnosis of  By: Linna Darner MD, Gwyndolyn Saxon    .  Carotid stenosis    s/p L CEA  . Cataract, nuclear 03/21/2014  . 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 Cervical spinal stenosis   . Cervical stenosis of spine   . Critical lower limb ischemia   . Degeneration macular 11/02/2011  . Depression    Dr Albertine Patricia  . Difficulty in walking(719.7) 04/05/2011  .  ESOPHAGITIS 08/06/2002   Qualifier: Diagnosis of  By: Talbert Cage CMA ([place]), June    . Essential and other specified forms of tremor 04/18/2013  . Fall at home 01/06/2017   left hip pain  . Falls   . Family history of colon cancer 07/24/2012  . Fecal impaction ([place]) 10/11/2012  . Femoral hernia, bilateral s/p lap repair 01/16/2013 01/16/2013  . Glaucoma, compensated 03/21/2014  . H/O cardiovascular stress test    a. 02/2003 -> no ischemia/infarct.  Marland Kitchen HAMMER TOE 04/23/2010   Qualifier: Diagnosis of  By: Linna Darner MD, Gwyndolyn Saxon    . Hematoma of leg   . Hip hematoma, right 09/09/2013  . History of shingles 2009  . HTN (hypertension) 08/22/2012  . Hx of echocardiogram    Echocardiogram 08/01/12: Mild LVH, EF 55-60%, normal wall motion.  . Hydrocele 10/11/2011  . Hyperlipidemia   . 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 Hypothyroidism   . Macular degeneration disease   . Macular edema   . Migraines    Dr Jannifer Franklin  . MORTON'S NEUROMA, LEFT 08/22/2007   Qualifier: Diagnosis of  By: Linna Darner MD, Gwyndolyn Saxon    . PAD (peripheral artery disease) ([place])   . Peripheral neuropathy   . Personal history of colonic polyps 01/05/2013   2014 2 mm adenomatous polyp   . PREMATURE ATRIAL CONTRACTIONS 03/14/2006   Qualifier: Diagnosis of  By: Linna Darner MD, Gwendolyn Lima 12/16/2011  . SPINAL STENOSIS 03/14/2006   Qualifier: Diagnosis of  By: Linna Darner MD, William   Cervical spine    . Testosterone deficiency    Dr Lawerance Bach, [facility]  . Unspecified vitamin D deficiency 08/06/2012  . Urge incontinence 10/09/2012  . Urinary tract infection, site not specified 02/13/2013  . Weakness 08/02/2012   Past Surgical History:  Procedure Laterality Date  . APPENDECTOMY  1941  . BREAST CYST EXCISION Left 01/16/2013   Procedure: MASS EXCISION AXILLA;  Surgeon: Adin Hector, MD;  Location: [place];  Service: General;  Laterality: Left;  . CAROTID ENDARTERECTOMY  2005    L  . CATARACT EXTRACTION Right   . COLONOSCOPY  2014   Dr. Deatra Ina    . EYE SURGERY Right 02/2012   retina  . INGUINAL HERNIA REPAIR Bilateral 01/16/2013   Procedure: LAPAROSCOPIC BILATERAL INGUINAL DIRECT AND INDIRECT, FEMORAL, AND OBTURATOR HERNIAS;  Surgeon: Adin Hector, MD;  Location: [place];  Service: General;  Laterality: Bilateral;  . INSERTION OF MESH N/A 01/16/2013   Procedure: INSERTION OF MESH;  Surgeon: Adin Hector, MD;  Location: [place];  Service: General;  Laterality: N/A;  . LUMBAR LAMINECTOMY    . TONSILLECTOMY AND ADENOIDECTOMY    . TOTAL HIP ARTHROPLASTY  2005   L    reports that he quit smoking about 40 years ago. His smoking use included pipe and cigars. He quit after 10.00 years of use. He has never used smokeless tobacco. He reports that he does not drink alcohol or use drugs. Social History   Socioeconomic History  . Marital status: Married    Spouse name: [name]  . Number of children: 2  . Years of education:  Not on file  . Highest education level: Not on file  Occupational History  . Occupation: retired     Fish farm manager: RETIRED  Tobacco Use  . Smoking status: Former Smoker    Years: 10.00    Types: Pipe, Cigars    Quit date: 02/16/1979    Years since quitting: 40.2  . Smokeless tobacco: Never Used  . Tobacco comment: Quit about age 83   Substance and Sexual Activity  . Alcohol use: No    Alcohol/week: 4.0 standard drinks    Types: 4 Standard drinks or equivalent per week  . Drug use: No  . Sexual activity: Never  Other Topics Concern  . Not on file  Social History Narrative   Lives at [facility] since 2013   Married - [name]   Former smoker -stopped 1981   Alcohol -wine occasionally    Exercise - walking, Ne-step    POA, Living Will   [employer] with walker   Social Determinants of Health   Financial Resource Strain:   . Difficulty of Paying Living Expenses:   Food Insecurity:   . Worried About Charity fundraiser in the Last Year:   . Arboriculturist in the Last Year:   Transportation Needs:   . Lexicographer (Medical):   Marland Kitchen Lack of Transportation (Non-Medical):   Physical Activity:   . Days of Exercise per Week:   . Minutes of Exercise per Session:   Stress:   . Feeling of Stress :   Social Connections:   . Frequency of Communication with Friends and Family:   . Frequency of Social Gatherings with Friends and Family:   . Attends Religious Services:   . Active Member of Clubs or Organizations:   . Attends Archivist Meetings:   Marland Kitchen Marital Status:   Intimate Partner Violence:   . Fear of Current or Ex-Partner:   . Emotionally Abused:   Marland Kitchen Physically Abused:   . Sexually Abused:     Functional Status Survey:    Family History  Problem Relation Age of Onset  . Stroke Mother 11  . Hypertension Mother   . Diabetes Mother   . Heart disease Mother   . Rectal cancer Father 84  . Heart disease Father        in 2s  . Cancer Father        colorectal  . Nephritis Sister   . Seizures Brother        died as child    Health Maintenance  Topic Date Due  . TETANUS/TDAP  01/14/2027  . INFLUENZA VACCINE  Completed  . PNA vac Low Risk Adult  Completed    Allergies  Allergen Reactions  . Latex Rash  . Tape Rash    Paper tape is ok to use     Allergies as of 05/02/2019      Reactions   Latex Rash   Tape Rash   Paper tape is ok to use      Medication List       Accurate as of May 02, 2019 10:27 AM. If you have any questions, ask your nurse or doctor.        acetaminophen 500 MG tablet Commonly known as: TYLENOL Take 1,000 mg by mouth 3 (three) times daily. And q6hprn for pain/fever   albuterol (2.5 MG/3ML) 0.083% nebulizer solution Commonly known as: PROVENTIL Take 2.5 mg by nebulization every 6 (six) hours as needed for wheezing  or shortness of breath.   amLODipine 5 MG tablet Commonly known as: NORVASC Take 2.5 mg by mouth daily.   ARIPiprazole 2 MG tablet Commonly known as: Abilify Take 1 tablet (2 mg total) by mouth daily.   aspirin  81 MG tablet Take 81 mg by mouth every morning.   atorvastatin 20 MG tablet Commonly known as: LIPITOR Take 20 mg by mouth daily.   azithromycin 500 MG tablet Commonly known as: Zithromax Take 1 tablet (500 mg total) by mouth daily for 5 days.   B-complex with vitamin C tablet Take 1 tablet by mouth daily.   bimatoprost 0.01 % Soln Commonly known as: LUMIGAN Place 1 drop into both eyes at bedtime.   buPROPion 300 MG 24 hr tablet Commonly known as: WELLBUTRIN XL Take 300 mg by mouth daily. per psychiatry recommendation   cefpodoxime 200 MG tablet Commonly known as: VANTIN Take 1 tablet (200 mg total) by mouth 2 (two) times daily for 5 days.   clonazePAM 0.5 MG tablet Commonly known as: KLONOPIN Take 1 tablet (0.5 mg total) by mouth at bedtime.   erythromycin ophthalmic ointment Place 1 application into both eyes at bedtime.   ICAPS PO Take 1 tablet by mouth daily.   ketoconazole 2 % cream Commonly known as: NIZORAL Apply 1 application topically 2 (two) times daily as needed for irritation. Apply to buttocks.   levothyroxine 50 MCG tablet Commonly known as: SYNTHROID Take 25-50 mcg by mouth See admin instructions. 50 mcg mon, tues, wed, thur, fri, and sat. 25 mcg on Sunday   levothyroxine 50 MCG tablet Commonly known as: SYNTHROID Take 50 mcg by mouth every morning. 7:30am on Monday, Tuesday, Wednesday, Thursday, Friday, and Saturday.   linaclotide 145 MCG Caps capsule Commonly known as: LINZESS Take 1 capsule (145 mcg total) by mouth daily before breakfast.   loratadine 10 MG tablet Commonly known as: CLARITIN Take 10 mg by mouth daily.   Melatonin 3 MG Tabs Take 3 mg by mouth at bedtime.   mirtazapine 30 MG tablet Commonly known as: REMERON Take 60 mg by mouth at bedtime.   polyethylene glycol 17 g packet Commonly known as: MIRALAX / GLYCOLAX Take 17 g by mouth daily. Hold for loose stool   Robafen DM Cgh/Chest Congest 10-100 MG/5ML liquid Generic  drug: dextromethorphan-guaiFENesin Take 10 mLs by mouth every 6 (six) hours as needed for cough.   sertraline 100 MG tablet Commonly known as: ZOLOFT Take 100 mg by mouth 2 (two) times daily. In the morning. 200mg  in Total.   Systane 0.4-0.3 % Soln Generic drug: Polyethyl Glycol-Propyl Glycol Apply 1 drop to eye 4 (four) times daily.   tamsulosin 0.4 MG Caps capsule Commonly known as: FLOMAX Take 0.4 mg by mouth daily after supper.   topiramate 25 MG tablet Commonly known as: TOPAMAX Take 25 mg by mouth 2 (two) times daily.   Vitamin D3 25 MCG (1000 UT) Caps Take 1,000 Units by mouth daily.   zinc oxide 20 % ointment Apply 1 application topically as needed for irritation.       Review of Systems  Review of Systems  Constitutional: Negative for activity change, appetite change, chills, diaphoresis, fatigue and fever.  HENT: Negative for mouth sores, postnasal drip, rhinorrhea, sinus pain and sore throat.   Respiratory: Negative for apnea, cough, chest tightness, shortness of breath and wheezing.   Cardiovascular: Negative for chest pain, palpitations and leg swelling.  Gastrointestinal: Negative for abdominal distention, abdominal pain, constipation, diarrhea, nausea  and vomiting.  Genitourinary: Negative for dysuria and frequency.  Musculoskeletal: Negative for arthralgias, joint swelling and myalgias.  Skin: Negative for rash.  Neurological: Negative for dizziness, syncope, weakness, light-headedness and numbness.  Psychiatric/Behavioral: Negative for behavioral problems, confusion and sleep disturbance.     Vitals:   05/02/19 1016  BP: 114/66  Pulse: 63  Resp: 20  Temp: 98.9 F (37.2 C)  SpO2: 96%  Weight: 137 lb (62.1 kg)  Height: 6' (1.829 m)   Body mass index is 18.58 kg/m. Physical Exam Constitutional: Oriented to person, place, and time.Very Frail HENT:  Head: Normocephalic.  Mouth/Throat: Oropharynx is clear and moist.  Eyes: Pupils are equal,  round, and reactive to light.  Neck: Neck supple.  Cardiovascular: Normal rate and normal heart sounds.  No murmur heard. Pulmonary/Chest: Effort normal and breath sounds normal. No respiratory distress. No wheezes. No Rales Abdominal: Soft. Bowel sounds are normal. No distension. There is no tenderness. There is no rebound.  Musculoskeletal: No edema.  Lymphadenopathy: none Neurological: Alert and oriented to person, place, and time.  Skin: Skin is warm and dry.  Psychiatric: Normal mood and affect. Behavior is normal. Thought content normal.   Labs reviewed: Basic Metabolic Panel: Recent Labs    09/11/18 0000 09/11/18 0000 04/26/19 0320 04/26/19 0534 04/27/19 0504  NA 142  --  143 143 141  K 4.1   < > 3.7 3.4* 4.4  CL 112  --  109 114* 110  CO2 23  --   --  23 22  GLUCOSE  --   --  115* 114* 95  BUN 34*  --  36* 38* 36*  CREATININE 1.3  --  1.10 1.04 1.07  CALCIUM 8.4  --   --  8.2* 8.3*   < > = values in this interval not displayed.   Liver Function Tests: Recent Labs    09/11/18 0000 04/26/19 0534 04/27/19 0504  AST 18 24 22   ALT 12 20 23   ALKPHOS 44 36* 34*  BILITOT  --  0.6 0.6  PROT 5.8 5.4* 5.4*  ALBUMIN 3.5 2.8* 2.5*   No results for input(s): LIPASE, AMYLASE in the last 8760 hours. No results for input(s): AMMONIA in the last 8760 hours. CBC: Recent Labs    04/26/19 0128 04/26/19 0128 04/26/19 0320 04/26/19 0534 04/27/19 0504  WBC 3.2*  --   --  2.8* 4.3  NEUTROABS 2.3  --   --  1.9 2.6  HGB 15.5   < > 14.3 11.9* 12.2*  HCT 50.0   < > 42.0 38.4* 38.3*  MCV 96.9  --   --  97.5 95.0  PLT 139*  --   --  123* 132*   < > = values in this interval not displayed.   Cardiac Enzymes: No results for input(s): CKTOTAL, CKMB, CKMBINDEX, TROPONINI in the last 8760 hours. BNP: Invalid input(s): POCBNP Lab Results  Component Value Date   HGBA1C 5 01/26/2018   Lab Results  Component Value Date   TSH 0.994 04/26/2019   Lab Results  Component Value  Date   [id number] 08/02/2012   No results found for: FOLATE No results found for: IRON, TIBC, FERRITIN  Imaging and Procedures obtained prior to SNF admission: CT ANGIO CHEST PE W OR WO CONTRAST  Result Date: 04/26/2019 CLINICAL DATA:  Shortness of breath, elevated D-dimer, rule out PE EXAM: CT ANGIOGRAPHY CHEST WITH CONTRAST TECHNIQUE: Multidetector CT imaging of the chest was performed using the standard protocol  during bolus administration of intravenous contrast. Multiplanar CT image reconstructions and MIPs were obtained to evaluate the vascular anatomy. CONTRAST:  188mL OMNIPAQUE IOHEXOL 350 MG/ML SOLN COMPARISON:  None. FINDINGS: Cardiovascular: Satisfactory opacification of the pulmonary arteries to the segmental level. No evidence of pulmonary embolism. Normal heart size. Left coronary artery calcifications. No pericardial effusion. Mediastinum/Nodes: No enlarged mediastinal, hilar, or axillary lymph nodes. Frothy debris in the upper trachea. Thyroid gland and esophagus demonstrate no significant findings. Lungs/Pleura: Small bilateral pleural effusions. Heterogeneous airspace opacity of the bilateral lung bases, with some tubular bronchiectasis and perhaps chronic fibrotic interstitial change, particularly in the right lung base. Upper Abdomen: No acute abnormality. Musculoskeletal: No chest wall abnormality. No acute or significant osseous findings. Review of the MIP images confirms the above findings. IMPRESSION: 1.  Negative examination for pulmonary embolism. 2. Small bilateral pleural effusions with heterogeneous airspace opacity of the bilateral lung bases, consistent with infection or aspiration. 3. There is some tubular bronchiectasis and perhaps underlying chronic fibrotic interstitial change, particularly in the right lung base. These findings could be due to fibrotic interstitial lung disease or sequelae of infection/aspiration. Consider follow-up ILD protocol CT examination of the  chest at resolution of acute illness and pleural effusions to further assess. 4.  Coronary artery disease. Electronically Signed   By: Eddie Candle M.D.   On: 04/26/2019 10:18   DG Chest Portable 1 View  Result Date: 04/26/2019 CLINICAL DATA:  Shortness of breath EXAM: PORTABLE CHEST 1 VIEW COMPARISON:  January 10, 2017 FINDINGS: There is no pneumothorax. There is a small left-sided pleural effusion. Bibasilar airspace opacities are noted which are new from prior study. The heart size is stable. There is no acute osseous abnormality. IMPRESSION: New bibasilar airspace opacities and small left-sided pleural effusion. Findings are concerning for pneumonia in the appropriate clinical setting. Electronically Signed   By: Constance Holster M.D.   On: 04/26/2019 01:42    Assessment/Plan  Community Acquired ed pneumonia Doing well finished the course of Zithromax and Vantin No acute issues today Speech eval in the hospital was negative for any aspiration  Weight loss Patient had lost a lot of weight was Reduced his Topamax his weight has stabilized  Essential hypertension On a low-dose of amlodipine which was recently changed Blood pressure seems to be controlled  Acquired hypothyroidism TSH normal levels  Chronic constipation Doing well on Linzess  Stage 3 chronic kidney disease, unspecified whether stage 3a or 3b CKD Creatinine stable  PAD (peripheral artery disease) ([place]) Had arterial studies done in the facility which showed moderate disease bilateral Not a candidate for any extensive work-up Is asymptomatic at this time  Depression with anxiety and paranoia On very high doses of Remeron Wellbutrin and Zoloft and Abilify Per his psychiatrist she is worried that patient has some pathological reason for his paranoia which seems to be new.  She has recommended that patient needs further imaging and follow-up with neurologist.  We are discussing all this with the patient's daughter  to see how aggressive they want to be  He does follow with Dr. Natasha Bence neurology Hyperlipidemia On Lipitor Hand tremors Doing okay on lower dose of Topamax if continues to lose weight we will reduce it further  Family/ staff Communication:   Labs/tests ordered:

## 2019-05-03 ENCOUNTER — Telehealth: Payer: Self-pay

## 2019-05-03 NOTE — Telephone Encounter (Signed)
Incoming call received from patients wife stating she was returning Dr.Gupta's call.  I reviewed chart and was unable to locate documentation of Dr.Gupta or anyone from [facility] calling patient.  No recent [place] labs No recent [place] imaging or procedure results No pending appointments   I informed Mrs.Gassett that I will send message to Dr.Gupta in hopes of finding out what the call was about.  Please advise

## 2019-05-04 NOTE — Telephone Encounter (Signed)
Message was left notifying pt's wife Olav Zawada) her appointment was rescheduled. Msg was not pertaining to [facility].

## 2019-05-10 ENCOUNTER — Non-Acute Institutional Stay (SKILLED_NURSING_FACILITY): Payer: Medicare Other | Admitting: Internal Medicine

## 2019-05-10 ENCOUNTER — Encounter: Payer: Self-pay | Admitting: Internal Medicine

## 2019-05-10 DIAGNOSIS — F339 Major depressive disorder, recurrent, unspecified: Secondary | ICD-10-CM

## 2019-05-10 DIAGNOSIS — I739 Peripheral vascular disease, unspecified: Secondary | ICD-10-CM

## 2019-05-10 DIAGNOSIS — R634 Abnormal weight loss: Secondary | ICD-10-CM

## 2019-05-10 DIAGNOSIS — R441 Visual hallucinations: Secondary | ICD-10-CM | POA: Diagnosis not present

## 2019-05-10 DIAGNOSIS — J189 Pneumonia, unspecified organism: Secondary | ICD-10-CM | POA: Diagnosis not present

## 2019-05-10 DIAGNOSIS — L89152 Pressure ulcer of sacral region, stage 2: Secondary | ICD-10-CM

## 2019-05-10 NOTE — Progress Notes (Signed)
Location:   [facility] Room Number: 37 Place of Service:  SNF [phone]) Provider:  Veleta Miners, MD  [name], MD  Patient Care Team: [name], MD as PCP - General (Internal Medicine) [name], MD as Consulting Physician (Urology) [name], MD as Consulting Physician (Neurology) [name], MD as Consulting Physician (Dermatology) [name], MD (Inactive) as Consulting Physician (Gastroenterology) [name], MD as Consulting Physician (Orthopedic Surgery) [name], MD as Consulting Physician (Cardiology) [name], MD as Consulting Physician (Ophthalmology) [name], MD as Referring Physician (Psychiatry) [name], MD as Consulting Physician (General Surgery) [name], MD as Consulting Physician (Orthopedic Surgery) Shelton, [name], NP as Nurse Practitioner (Family Medicine) Mast, Man X, NP as Nurse Practitioner (Internal Medicine)  Extended Emergency Contact Information Primary Emergency Contact: Stead,[name] Address: [address]          [place], [place] [address] [place] Phone: [phone] Relation: Spouse Secondary Emergency Contact: Durwin Glaze Mobile Phone: [phone] Relation: Daughter   Code Status:  DNR Goals of care: Advanced Directive information Advanced Directives 05/10/2019  Does Patient Have a Medical Advance Directive? Yes  Type of Paramedic of [place];Living will;Out of facility DNR (pink MOST or yellow form)  Does patient want to make changes to medical advance directive? No - Patient declined  Copy of [place] in Chart? Yes - validated most recent copy scanned in chart (See row information)  Pre-existing out of facility DNR order (yellow form or pink MOST form) Pink MOST/Yellow Form most recent copy in chart - Physician notified to receive inpatient order     Chief  Complaint  Patient presents with   Acute Visit    HPI:  Pt is a 86 y.o. male seen today for an acute visit for his Hallucinations, depression and paranoia.  Patient was admitted in the hospital from 3/11-3/12 for bilateral pneumonia He has h/o HTN, Hyperlipidemia, PAD, BPH , CKD stage 2, Hypothyroidism, and Major Depression Also Has h/o Cervial Stenosis with Compressive Myelopathy,  Community-acquired pneumonia He is doing very well with no cough fever shortness of breath Hallucinations/depression Patient was seen by his psychiatrist.  Per her note she is worried that patient is having these episodes in which he sees things and has paranoia behavior.  Patient does not tell me this.  As he is embarrassed.  He did tell me that he is depressed.  And he has seen things in his room. PAD Patient had ABIs done which showed moderate disease.  He does not have any symptoms in any open wounds Weight loss Number of medications which can cause weight loss.  I had decreased the dose of Topamax which has helped stabilize his weight.  He continues to complain of poor appetite  Past Medical History:  Diagnosis Date   Acute urinary retention 01/28/2013   AKI (acute kidney injury) ([place]) 01/26/2013   Altered mental status 09/09/2013   Anemia    B12 deficiency   ANXIETY 06/07/2007   Qualifier: Diagnosis of  By: Talbert Cage CMA (AAMA), June     ARTHRITIS 06/07/2007   Qualifier: Diagnosis of  By: Talbert Cage CMA (AAMA), June     Balance problems 04/05/2011   Benign essential tremor    Benign fibroma of prostate 10/11/2011   Bilateral inguinal hernia 10/09/2012   Bilateral leg edema 09/13/2012  Bladder retention 02/05/2013   Bradycardia, sinus 08/02/2012   CAROTID ARTERY DISEASE 03/31/2007   Qualifier: Diagnosis of  By: Linna Darner MD, William     Carotid stenosis    s/p L CEA   Cataract, nuclear 03/21/2014   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   Cervical spinal stenosis    Cervical stenosis of  spine    Critical lower limb ischemia    Degeneration macular 11/02/2011   Depression    Dr Albertine Patricia   Difficulty in walking(719.7) 04/05/2011   ESOPHAGITIS 08/06/2002   Qualifier: Diagnosis of  By: Talbert Cage CMA (AAMA), June     Essential and other specified forms of tremor 04/18/2013   Fall at home 01/06/2017   left hip pain   Falls    Family history of colon cancer 07/24/2012   Fecal impaction ([place]) 10/11/2012   Femoral hernia, bilateral s/p lap repair 01/16/2013 01/16/2013   Glaucoma, compensated 03/21/2014   H/O cardiovascular stress test    a. 02/2003 -> no ischemia/infarct.   HAMMER TOE 04/23/2010   Qualifier: Diagnosis of  By: Linna Darner MD, William     Hematoma of leg    Hip hematoma, right 09/09/2013   History of shingles 2009   HTN (hypertension) 08/22/2012   Hx of echocardiogram    Echocardiogram 08/01/12: Mild LVH, EF 55-60%, normal wall motion.   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   Hypothyroidism    Macular degeneration disease    Macular edema    Migraines    Dr Jannifer Franklin   MORTON'S NEUROMA, LEFT 08/22/2007   Qualifier: Diagnosis of  By: Linna Darner MD, Gwyndolyn Saxon     PAD (peripheral artery disease) [facility])    Peripheral neuropathy    Personal history of colonic polyps 01/05/2013   2014 2 mm adenomatous polyp    PREMATURE ATRIAL CONTRACTIONS 03/14/2006   Qualifier: Diagnosis of  By: Linna Darner MD, Gwendolyn Lima 12/16/2011   SPINAL STENOSIS 03/14/2006   Qualifier: Diagnosis of  By: Linna Darner MD, William   Cervical spine     Testosterone deficiency    Dr Lawerance Bach, [facility]   Unspecified vitamin D deficiency 08/06/2012   Urge incontinence 10/09/2012   Urinary tract infection, site not specified 02/13/2013   Weakness 08/02/2012   Past Surgical History:  Procedure Laterality Date   APPENDECTOMY  1941   BREAST CYST EXCISION Left 01/16/2013   Procedure: MASS EXCISION AXILLA;  Surgeon: Adin Hector, MD;  Location:  [place];  Service: General;  Laterality: Left;   CAROTID ENDARTERECTOMY  2005    L   CATARACT EXTRACTION Right    COLONOSCOPY  2014   Dr. Deatra Ina   EYE SURGERY Right 02/2012   retina   INGUINAL HERNIA REPAIR Bilateral 01/16/2013   Procedure: LAPAROSCOPIC BILATERAL INGUINAL DIRECT AND INDIRECT, FEMORAL, AND OBTURATOR HERNIAS;  Surgeon: Adin Hector, MD;  Location: [facility];  Service: General;  Laterality: Bilateral;   INSERTION OF MESH N/A 01/16/2013   Procedure: INSERTION OF MESH;  Surgeon: Adin Hector, MD;  Location: [facility];  Service: General;  Laterality: N/A;   LUMBAR LAMINECTOMY     TONSILLECTOMY AND ADENOIDECTOMY     TOTAL HIP ARTHROPLASTY  2005   L    Allergies  Allergen Reactions   Latex Rash   Tape Rash    Paper tape is ok to use     Allergies as of 05/10/2019      Reactions   Latex Rash  Tape Rash   Paper tape is ok to use      Medication List       Accurate as of May 10, 2019 10:13 AM. If you have any questions, ask your nurse or doctor.        acetaminophen 500 MG tablet Commonly known as: TYLENOL Take 1,000 mg by mouth 3 (three) times daily. And q6hprn for pain/fever   albuterol (2.5 MG/3ML) 0.083% nebulizer solution Commonly known as: PROVENTIL Take 2.5 mg by nebulization every 6 (six) hours as needed for wheezing or shortness of breath.   amLODipine 5 MG tablet Commonly known as: NORVASC Take 2.5 mg by mouth daily.   ARIPiprazole 2 MG tablet Commonly known as: Abilify Take 1 tablet (2 mg total) by mouth daily.   aspirin 81 MG tablet Take 81 mg by mouth every morning.   atorvastatin 20 MG tablet Commonly known as: LIPITOR Take 20 mg by mouth daily.   B-complex with vitamin C tablet Take 1 tablet by mouth daily.   bimatoprost 0.01 % Soln Commonly known as: LUMIGAN Place 1 drop into both eyes at bedtime.   buPROPion 300 MG 24 hr tablet Commonly known as: WELLBUTRIN XL Take 300 mg by mouth daily. per psychiatry  recommendation   clonazePAM 0.5 MG tablet Commonly known as: KLONOPIN Take 1 tablet (0.5 mg total) by mouth at bedtime.   erythromycin ophthalmic ointment Place 1 application into both eyes at bedtime.   ICAPS PO Take 1 tablet by mouth daily.   ketoconazole 2 % cream Commonly known as: NIZORAL Apply 1 application topically 2 (two) times daily as needed for irritation. Apply to buttocks.   levothyroxine 50 MCG tablet Commonly known as: SYNTHROID Take 50 mcg by mouth See admin instructions. 7:30am on Mon, tues, wed, thur, fri, and Saturday.   levothyroxine 50 MCG tablet Commonly known as: SYNTHROID Take 25 mcg by mouth every Sunday. At 7:30am 1/2 Tablet = 53mcg.   linaclotide 145 MCG Caps capsule Commonly known as: LINZESS Take 1 capsule (145 mcg total) by mouth daily before breakfast.   loratadine 10 MG tablet Commonly known as: CLARITIN Take 10 mg by mouth daily.   melatonin 3 MG Tabs tablet Take 3 mg by mouth at bedtime.   mirtazapine 30 MG tablet Commonly known as: REMERON Take 60 mg by mouth at bedtime.   PEDIALYTE PO Take 8 oz by mouth daily.   polyethylene glycol 17 g packet Commonly known as: MIRALAX / GLYCOLAX Take 17 g by mouth daily. Hold for loose stool   Robafen DM Cgh/Chest Congest 10-100 MG/5ML liquid Generic drug: dextromethorphan-guaiFENesin Take 10 mLs by mouth every 6 (six) hours as needed for cough.   sertraline 100 MG tablet Commonly known as: ZOLOFT Take 100 mg by mouth 2 (two) times daily. In the morning. 200mg  in Total.   Systane 0.4-0.3 % Soln Generic drug: Polyethyl Glycol-Propyl Glycol Apply 1 drop to eye 4 (four) times daily.   tamsulosin 0.4 MG Caps capsule Commonly known as: FLOMAX Take 0.4 mg by mouth daily after supper.   topiramate 25 MG tablet Commonly known as: TOPAMAX Take 25 mg by mouth 2 (two) times daily.   Vitamin D3 25 MCG (1000 UT) Caps Take 1,000 Units by mouth daily.   zinc oxide 20 % ointment Apply 1  application topically as needed for irritation.       Review of Systems  Constitutional: Positive for activity change.  HENT: Negative.   Respiratory: Negative.   Cardiovascular:  Negative.   Gastrointestinal: Negative.   Genitourinary: Negative.   Musculoskeletal: Positive for gait problem.  Neurological: Positive for weakness.  Psychiatric/Behavioral: Positive for dysphoric mood, hallucinations and sleep disturbance.    Immunization History  Administered Date(s) Administered   DTaP 11/05/2005   Hepatitis B [birth year]   Influenza Split 11/12/2011, 11/29/2013   Influenza Whole 10/30/2007, 11/12/2008, 11/26/2009   Influenza, High Dose Seasonal PF 12/31/2015, 11/24/2016, 11/30/2018   Influenza,inj,quad, With Preservative 10/16/2016   Influenza-Unspecified 11/28/2017   Moderna SARS-COVID-2 Vaccination 02/19/2019, 03/19/2019   PPD Test 06/23/2011   Pneumococcal Conjugate-13 01/14/2016   Pneumococcal Polysaccharide-23 02/16/1999   Tdap 01/13/2017   Varicella 11/09/2005   Zoster 08/30/2006   Pertinent  Health Maintenance Due  Topic Date Due   INFLUENZA VACCINE  Completed   PNA vac Low Risk Adult  Completed   Fall Risk  10/28/2017 10/27/2016 06/09/2016 02/03/2016 10/28/2015  Falls in the past year? Yes Yes Yes Yes Yes  Number falls in past yr: 1 2 or more 2 or more 2 or more 2 or more  Comment - - - 2 falls in last 2 months -  Injury with Fall? Yes Yes No Yes No  Comment - L hip - scraps and bruises -  Risk Factor Category  - - - - -  Risk for fall due to : - - - - Impaired balance/gait  Follow up - - - - -   Functional Status Survey:    Vitals:   05/10/19 0945  BP: 109/68  Pulse: 60  Resp: 18  Temp: 98.7 F (37.1 C)  SpO2: 95%  Weight: 131 lb 3.2 oz (59.5 kg)  Height: 6' (1.829 m)   Body mass index is 17.79 kg/m. Physical Exam  Constitutional: Oriented to person, place, and time. Very Frail HENT:  Head: Normocephalic.  Mouth/Throat: Oropharynx  is clear and moist.  Eyes: Pupils are equal, round, and reactive to light.  Neck: Neck supple.  Cardiovascular: Normal rate and normal heart sounds.  No murmur heard. Pulmonary/Chest: Effort normal and breath sounds normal. No respiratory distress. No wheezes. Has no rales.  Abdominal: Soft. Bowel sounds are normal. No distension. There is no tenderness. There is no rebound.  Musculoskeletal: No edema.  Lymphadenopathy: none Neurological: Alert and oriented to person, place, and time.  Skin: Skin is warm and dry.  Psychiatric: Normal mood and affect. Behavior is normal. Thought content normal.    Labs reviewed: Recent Labs    09/11/18 0000 09/11/18 0000 04/26/19 0320 04/26/19 0534 04/27/19 0504  NA 142  --  143 143 141  K 4.1   < > 3.7 3.4* 4.4  CL 112  --  109 114* 110  CO2 23  --   --  23 22  GLUCOSE  --   --  115* 114* 95  BUN 34*  --  36* 38* 36*  CREATININE 1.3  --  1.10 1.04 1.07  CALCIUM 8.4  --   --  8.2* 8.3*   < > = values in this interval not displayed.   Recent Labs    09/11/18 0000 04/26/19 0534 04/27/19 0504  AST 18 24 22   ALT 12 20 23   ALKPHOS 44 36* 34*  BILITOT  --  0.6 0.6  PROT 5.8 5.4* 5.4*  ALBUMIN 3.5 2.8* 2.5*   Recent Labs    04/26/19 0128 04/26/19 0128 04/26/19 0320 04/26/19 0534 04/27/19 0504  WBC 3.2*  --   --  2.8* 4.3  NEUTROABS 2.3  --   --  1.9 2.6  HGB 15.5   < > 14.3 11.9* 12.2*  HCT 50.0   < > 42.0 38.4* 38.3*  MCV 96.9  --   --  97.5 95.0  PLT 139*  --   --  123* 132*   < > = values in this interval not displayed.   Lab Results  Component Value Date   TSH 0.994 04/26/2019   Lab Results  Component Value Date   HGBA1C 5 01/26/2018   Lab Results  Component Value Date   CHOL 146 11/24/2018   HDL 38 11/24/2018   LDLCALC 89 11/24/2018   LDLDIRECT 132.5 09/08/2012   TRIG 91 11/24/2018   CHOLHDL 2.7 11/15/2016    Significant Diagnostic Results in last 30 days:  CT ANGIO CHEST PE W OR WO CONTRAST  Result Date:  04/26/2019 CLINICAL DATA:  Shortness of breath, elevated D-dimer, rule out PE EXAM: CT ANGIOGRAPHY CHEST WITH CONTRAST TECHNIQUE: Multidetector CT imaging of the chest was performed using the standard protocol during bolus administration of intravenous contrast. Multiplanar CT image reconstructions and MIPs were obtained to evaluate the vascular anatomy. CONTRAST:  161mL OMNIPAQUE IOHEXOL 350 MG/ML SOLN COMPARISON:  None. FINDINGS: Cardiovascular: Satisfactory opacification of the pulmonary arteries to the segmental level. No evidence of pulmonary embolism. Normal heart size. Left coronary artery calcifications. No pericardial effusion. Mediastinum/Nodes: No enlarged mediastinal, hilar, or axillary lymph nodes. Frothy debris in the upper trachea. Thyroid gland and esophagus demonstrate no significant findings. Lungs/Pleura: Small bilateral pleural effusions. Heterogeneous airspace opacity of the bilateral lung bases, with some tubular bronchiectasis and perhaps chronic fibrotic interstitial change, particularly in the right lung base. Upper Abdomen: No acute abnormality. Musculoskeletal: No chest wall abnormality. No acute or significant osseous findings. Review of the MIP images confirms the above findings. IMPRESSION: 1.  Negative examination for pulmonary embolism. 2. Small bilateral pleural effusions with heterogeneous airspace opacity of the bilateral lung bases, consistent with infection or aspiration. 3. There is some tubular bronchiectasis and perhaps underlying chronic fibrotic interstitial change, particularly in the right lung base. These findings could be due to fibrotic interstitial lung disease or sequelae of infection/aspiration. Consider follow-up ILD protocol CT examination of the chest at resolution of acute illness and pleural effusions to further assess. 4.  Coronary artery disease. Electronically Signed   By: Eddie Candle M.D.   On: 04/26/2019 10:18   DG Chest Portable 1 View  Result Date:  04/26/2019 CLINICAL DATA:  Shortness of breath EXAM: PORTABLE CHEST 1 VIEW COMPARISON:  January 10, 2017 FINDINGS: There is no pneumothorax. There is a small left-sided pleural effusion. Bibasilar airspace opacities are noted which are new from prior study. The heart size is stable. There is no acute osseous abnormality. IMPRESSION: New bibasilar airspace opacities and small left-sided pleural effusion. Findings are concerning for pneumonia in the appropriate clinical setting. Electronically Signed   By: Constance Holster M.D.   On: 04/26/2019 01:42    Assessment/Plan Hallucinations, visual Or his psychiatrist this is a new issue.  His many years of depression he has never had paranoia and visual hallucinations D/W the Daughter patient and his wife They want to discuss and see if we should go ahead and order MRI They will let me know next week.  Patient also has a follow-up with neurology Increase his Abilify to 3 mg Community acquired pneumonia, Is doing really well. Weight loss Weight has stabilized Continue supplements PAD (peripheral artery disease) ([place]) D/W with daughter Continue to follow for now  Major depression, recurrent, chronic (HCC) He is on a number of medications and follows with a psychiatrist Pressure ulcer of sacral region, stage 2 ([place]) Hydrocolloid dressing with nystatin cream  ACP Discussed with Daughter wife and patient Patient is DNR now.  MOST form was discussed in he wants limited intervention.  With antibiotics and IV fluids if needed but no feeding tube   Family/ staff Communication:   Labs/tests ordered:  Total time spent in this patient care encounter was  45_  minutes; greater than 50% of the visit spent counseling patient and staff, reviewing records , Labs and coordinating care for problems addressed at this encounter.

## 2019-06-01 ENCOUNTER — Non-Acute Institutional Stay (SKILLED_NURSING_FACILITY): Payer: Medicare Other | Admitting: Nurse Practitioner

## 2019-06-01 ENCOUNTER — Encounter: Payer: Self-pay | Admitting: Nurse Practitioner

## 2019-06-01 DIAGNOSIS — E039 Hypothyroidism, unspecified: Secondary | ICD-10-CM

## 2019-06-01 DIAGNOSIS — F333 Major depressive disorder, recurrent, severe with psychotic symptoms: Secondary | ICD-10-CM

## 2019-06-01 DIAGNOSIS — N3941 Urge incontinence: Secondary | ICD-10-CM

## 2019-06-01 DIAGNOSIS — J309 Allergic rhinitis, unspecified: Secondary | ICD-10-CM

## 2019-06-01 DIAGNOSIS — M159 Polyosteoarthritis, unspecified: Secondary | ICD-10-CM

## 2019-06-01 DIAGNOSIS — G250 Essential tremor: Secondary | ICD-10-CM

## 2019-06-01 DIAGNOSIS — I1310 Hypertensive heart and chronic kidney disease without heart failure, with stage 1 through stage 4 chronic kidney disease, or unspecified chronic kidney disease: Secondary | ICD-10-CM | POA: Diagnosis not present

## 2019-06-01 DIAGNOSIS — K5909 Other constipation: Secondary | ICD-10-CM

## 2019-06-01 NOTE — Assessment & Plan Note (Signed)
Stable, continue Levothyroxine, TSH 0.994 04/26/19

## 2019-06-01 NOTE — Assessment & Plan Note (Signed)
Stable, continue Tylenol.  

## 2019-06-01 NOTE — Assessment & Plan Note (Signed)
Stable, continue Tamsulosin 

## 2019-06-01 NOTE — Progress Notes (Signed)
Location:   SNF [place] Room Number: 37 Place of Service:  SNF (31) Provider: [facility] Wauneta Silveria NP  [name], MD  Patient Care Team: [name], MD as PCP - General (Internal Medicine) [name], MD as Consulting Physician (Urology) [name], MD as Consulting Physician (Neurology) [name], MD as Consulting Physician (Dermatology) [name], MD (Inactive) as Consulting Physician (Gastroenterology) [name], MD as Consulting Physician (Orthopedic Surgery) [name], MD as Consulting Physician (Cardiology) [name], MD as Consulting Physician (Ophthalmology) [name], MD as Referring Physician (Psychiatry) [name], MD as Consulting Physician (General Surgery) [name], MD as Consulting Physician (Orthopedic Surgery) [place], [name], NP as Nurse Practitioner (Family Medicine) Danicia Terhaar X, NP as Nurse Practitioner (Internal Medicine)  Extended Emergency Contact Information Primary Emergency Contact: Beeney,[name] Address: [address]          [place], [place] [address] [place] Phone: [phone] Relation: Spouse Secondary Emergency Contact: Durwin Glaze Mobile Phone: [phone] Relation: Daughter  Code Status:  DNR Goals of care: Advanced Directive information Advanced Directives 06/01/2019  Does Patient Have a Medical Advance Directive? Yes  Type of Advance Directive Living will;Out of facility DNR (pink MOST or yellow form);Healthcare Power of Attorney  Does patient want to make changes to medical advance directive? No - Patient declined  Copy of [place] in Chart? Yes - validated most recent copy scanned in chart (See row information)  Pre-existing out of facility DNR order (yellow form or pink MOST form) Yellow form placed in chart (order not valid for inpatient use);Pink MOST form placed in chart (order not valid for inpatient  use)     Chief Complaint  Patient presents with  . Medical Management of Chronic Issues    HPI:  Pt is a 86 y.o. male seen today for medical management of chronic diseases.    The patient resides in SNF [facility] for safety, care assistance, his mood is stable, on Mirtazapine 60mg  qd, Sertraline 100mg  bid, Clonazepam 0.5mg  qd, Wellbutrin 300mg  qd, Abilify 3mg  qd. OA multiple sites, stable, on Tylenol 1000mg  tid. HTN, blood pressure is controlled on Amlodipine 2.5mg  qd. Hypothyroidism, stable, on Levothyroxine 78mcg x1/wk, 13mcg x6/wk. Allergic rhinits, stable, on Loratadine 10mg  qd. Constipation, stable, on Linzess, MiraLax qd. Essential tremor, stable, on Topamax 25mg  bid. Urge incontinence, stable, on Tamsulosin 0.4mg  qd.   Past Medical History:  Diagnosis Date  . Acute urinary retention 01/28/2013  . AKI (acute kidney injury) ([place]) 01/26/2013  . Altered mental status 09/09/2013  . Anemia    B12 deficiency  . ANXIETY 06/07/2007   Qualifier: Diagnosis of  By: Talbert Cage CMA ([place]), June    . ARTHRITIS 06/07/2007   Qualifier: Diagnosis of  By: Talbert Cage CMA ([place]), June    . Balance problems 04/05/2011  . Benign essential tremor   . Benign fibroma of prostate 10/11/2011  . Bilateral inguinal hernia 10/09/2012  . Bilateral leg edema 09/13/2012  . Bladder retention 02/05/2013  . Bradycardia, sinus 08/02/2012  . CAROTID ARTERY DISEASE 03/31/2007   Qualifier: Diagnosis of  By: Linna Darner MD, Gwyndolyn Saxon    . Carotid stenosis    s/p L CEA  . Cataract, nuclear 03/21/2014  . 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 Cervical spinal stenosis   . Cervical stenosis of spine   . Critical lower limb ischemia   . Degeneration macular 11/02/2011  .  Depression    Dr Albertine Patricia  . Difficulty in walking(719.7) 04/05/2011  . ESOPHAGITIS 08/06/2002   Qualifier: Diagnosis of  By: Talbert Cage CMA ([place]), June    . Essential and other specified forms of tremor 04/18/2013  . Fall at home 01/06/2017   left hip pain  . Falls   . Family  history of colon cancer 07/24/2012  . Fecal impaction ([place]) 10/11/2012  . Femoral hernia, bilateral s/p lap repair 01/16/2013 01/16/2013  . Glaucoma, compensated 03/21/2014  . H/O cardiovascular stress test    a. 02/2003 -> no ischemia/infarct.  Marland Kitchen HAMMER TOE 04/23/2010   Qualifier: Diagnosis of  By: Linna Darner MD, Gwyndolyn Saxon    . Hematoma of leg   . Hip hematoma, right 09/09/2013  . History of shingles 2009  . HTN (hypertension) 08/22/2012  . Hx of echocardiogram    Echocardiogram 08/01/12: Mild LVH, EF 55-60%, normal wall motion.  . Hydrocele 10/11/2011  . Hyperlipidemia   . 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 Hypothyroidism   . Macular degeneration disease   . Macular edema   . Migraines    Dr Jannifer Franklin  . MORTON'S NEUROMA, LEFT 08/22/2007   Qualifier: Diagnosis of  By: Linna Darner MD, Gwyndolyn Saxon    . PAD (peripheral artery disease) ([place])   . Peripheral neuropathy   . Personal history of colonic polyps 01/05/2013   2014 2 mm adenomatous polyp   . PREMATURE ATRIAL CONTRACTIONS 03/14/2006   Qualifier: Diagnosis of  By: Linna Darner MD, Gwendolyn [place]ima 12/16/2011  . SPINAL STENOSIS 03/14/2006   Qualifier: Diagnosis of  By: Linna Darner MD, William   Cervical spine    . Testosterone deficiency    Dr Lawerance Bach, [facility]  . Unspecified vitamin D deficiency 08/06/2012  . Urge incontinence 10/09/2012  . Urinary tract infection, site not specified 02/13/2013  . Weakness 08/02/2012   Past Surgical History:  Procedure Laterality Date  . APPENDECTOMY  1941  . BREAST CYST EXCISION Left 01/16/2013   Procedure: MASS EXCISION AXILLA;  Surgeon: Adin Hector, MD;  Location: [place];  Service: General;  Laterality: Left;  . CAROTID ENDARTERECTOMY  2005    L  . CATARACT EXTRACTION Right   . COLONOSCOPY  2014   Dr. Deatra Ina  . EYE SURGERY Right 02/2012   retina  . INGUINAL HERNIA REPAIR Bilateral 01/16/2013   Procedure: LAPAROSCOPIC BILATERAL INGUINAL DIRECT AND INDIRECT, FEMORAL, AND OBTURATOR HERNIAS;  Surgeon: Adin Hector, MD;  Location: [place];  Service: General;  Laterality: Bilateral;  . INSERTION OF MESH N/A 01/16/2013   Procedure: INSERTION OF MESH;  Surgeon: Adin Hector, MD;  Location: [place];  Service: General;  Laterality: N/A;  . LUMBAR LAMINECTOMY    . TONSILLECTOMY AND ADENOIDECTOMY    . TOTAL HIP ARTHROPLASTY  2005   L    Allergies  Allergen Reactions  . Latex Rash  . Tape Rash    Paper tape is ok to use     Allergies as of 06/01/2019      Reactions   Latex Rash   Tape Rash   Paper tape is ok to use      Medication List       Accurate as of June 01, 2019  3:59 PM. If you have any questions, ask your nurse or doctor.        acetaminophen 500 MG tablet Commonly known as: TYLENOL Take 1,000 mg by mouth 3 (three) times daily. And q6hprn for pain/fever   albuterol (  2.5 MG/3ML) 0.083% nebulizer solution Commonly known as: PROVENTIL Take 2.5 mg by nebulization every 6 (six) hours as needed for wheezing or shortness of breath.   amLODipine 5 MG tablet Commonly known as: NORVASC Take 2.5 mg by mouth daily.   ARIPiprazole 2 MG tablet Commonly known as: Abilify Take 1 tablet (2 mg total) by mouth daily. What changed: how much to take   aspirin 81 MG tablet Take 81 mg by mouth every morning.   atorvastatin 20 MG tablet Commonly known as: LIPITOR Take 20 mg by mouth daily.   B-complex with vitamin C tablet Take 1 tablet by mouth daily.   bimatoprost 0.01 % Soln Commonly known as: LUMIGAN Place 1 drop into both eyes at bedtime.   buPROPion 300 MG 24 hr tablet Commonly known as: WELLBUTRIN XL Take 300 mg by mouth daily. per psychiatry recommendation   clonazePAM 0.5 MG tablet Commonly known as: KLONOPIN Take 1 tablet (0.5 mg total) by mouth at bedtime.   erythromycin ophthalmic ointment Place 1 application into both eyes at bedtime.   ICAPS PO Take 1 tablet by mouth daily.   ketoconazole 2 % cream Commonly known as: NIZORAL Apply 1 application topically  2 (two) times daily as needed for irritation. Apply to buttocks.   levothyroxine 50 MCG tablet Commonly known as: SYNTHROID Take 50 mcg by mouth See admin instructions. 7:30am on Mon, tues, wed, thur, fri, and Saturday.   levothyroxine 50 MCG tablet Commonly known as: SYNTHROID Take 25 mcg by mouth every Sunday. At 7:30am 1/2 Tablet = 40mcg.   linaclotide 145 MCG Caps capsule Commonly known as: LINZESS Take 1 capsule (145 mcg total) by mouth daily before breakfast.   loratadine 10 MG tablet Commonly known as: CLARITIN Take 10 mg by mouth daily.   melatonin 3 MG Tabs tablet Take 3 mg by mouth at bedtime.   mirtazapine 30 MG tablet Commonly known as: REMERON Take 60 mg by mouth at bedtime.   PEDIALYTE PO Take 8 oz by mouth daily.   polyethylene glycol 17 g packet Commonly known as: MIRALAX / GLYCOLAX Take 17 g by mouth daily. Hold for loose stool   Robafen DM Cgh/Chest Congest 10-100 MG/5ML liquid Generic drug: dextromethorphan-guaiFENesin Take 10 mLs by mouth every 6 (six) hours as needed for cough.   sertraline 100 MG tablet Commonly known as: ZOLOFT Take 100 mg by mouth 2 (two) times daily. In the morning. 200mg  in Total.   Systane 0.4-0.3 % Soln Generic drug: Polyethyl Glycol-Propyl Glycol Apply 1 drop to eye 4 (four) times daily.   tamsulosin 0.4 MG Caps capsule Commonly known as: FLOMAX Take 0.4 mg by mouth daily after supper.   topiramate 25 MG tablet Commonly known as: TOPAMAX Take 25 mg by mouth 2 (two) times daily.   Vitamin D3 25 MCG (1000 UT) Caps Take 1,000 Units by mouth daily.   zinc oxide 20 % ointment Apply 1 application topically as needed for irritation.       Review of Systems  Constitutional: Negative for activity change, appetite change and unexpected weight change.  HENT: Positive for hearing loss. Negative for congestion and voice change.   Eyes: Positive for visual disturbance.  Respiratory: Negative for cough and shortness of  breath.   Cardiovascular: Negative for chest pain, palpitations and leg swelling.  Gastrointestinal: Negative for abdominal distention, abdominal pain and constipation.  Genitourinary: Negative for difficulty urinating and dysuria.       Incontinent of urine.   Musculoskeletal: Positive  for arthralgias and gait problem.  Skin: Negative for color change.  Neurological: Negative for tremors, speech difficulty, weakness and light-headedness.       Memory lapses. Fine tremor in fingers.   Psychiatric/Behavioral: Negative for agitation, behavioral problems, dysphoric mood and sleep disturbance.       ? Paranoid delusions    Immunization History  Administered Date(s) Administered  . DTaP 11/05/2005  . Hepatitis B 1932/06/20  . Influenza Split 11/12/2011, 11/29/2013  . Influenza Whole 10/30/2007, 11/12/2008, 11/26/2009  . Influenza, High Dose Seasonal PF 12/31/2015, 11/24/2016, 11/30/2018  . Influenza,inj,quad, With Preservative 10/16/2016  . Influenza-Unspecified 11/28/2017  . Moderna SARS-COVID-2 Vaccination 02/19/2019, 03/19/2019  . PPD Test 06/23/2011  . Pneumococcal Conjugate-13 01/14/2016  . Pneumococcal Polysaccharide-23 02/16/1999  . Tdap 01/13/2017  . Varicella 11/09/2005  . Zoster 08/30/2006   Pertinent  Health Maintenance Due  Topic Date Due  . INFLUENZA VACCINE  09/16/2019  . PNA vac Low Risk Adult  Completed   Fall Risk  10/28/2017 10/27/2016 06/09/2016 02/03/2016 10/28/2015  Falls in the past year? Yes Yes Yes Yes Yes  Number falls in past yr: 1 2 or more 2 or more 2 or more 2 or more  Comment - - - 2 falls in last 2 months -  Injury with Fall? Yes Yes No Yes No  Comment - L hip - scraps and bruises -  Risk Factor Category  - - - - -  Risk for fall due to : - - - - Impaired balance/gait  Follow up - - - - -   Functional Status Survey:    Vitals:   06/01/19 1430  BP: (!) 115/54  Pulse: (!) 58  Resp: 16  Temp: (!) 97.4 F (36.3 C)  SpO2: 96%  Weight: 133 lb  1.6 oz (60.4 kg)  Height: 6' (1.829 m)   Body mass index is 18.05 kg/m. Physical Exam Vitals and nursing note reviewed.  Constitutional:      Appearance: Normal appearance.  HENT:     Head: Normocephalic and atraumatic.     Mouth/Throat:     Mouth: Mucous membranes are moist.  Eyes:     Extraocular Movements: Extraocular movements intact.     Conjunctiva/sclera: Conjunctivae normal.     Pupils: Pupils are equal, round, and reactive to light.     Comments: Left eye blind.   Cardiovascular:     Rate and Rhythm: Normal rate and regular rhythm.     Heart sounds: No murmur.  Pulmonary:     Effort: Pulmonary effort is normal.     Breath sounds: No rales.  Abdominal:     General: Bowel sounds are normal. There is no distension.     Palpations: Abdomen is soft.     Tenderness: There is no abdominal tenderness.  Musculoskeletal:     Cervical back: Normal range of motion and neck supple.     Right lower leg: No edema.     Left lower leg: No edema.  Skin:    General: Skin is warm and dry.  Neurological:     General: No focal deficit present.     Mental Status: He is alert. Mental status is at baseline.     Gait: Gait abnormal.     Comments: Oriented to person, place. Fine, mild resting tremor in fingers, not disabling.   Psychiatric:        Mood and Affect: Mood normal.        Behavior: Behavior normal.  Labs reviewed: Recent Labs    09/11/18 0000 09/11/18 0000 04/26/19 0320 04/26/19 0534 04/27/19 0504  NA 142  --  143 143 141  K 4.1   < > 3.7 3.4* 4.4  CL 112  --  109 114* 110  CO2 23  --   --  23 22  GLUCOSE  --   --  115* 114* 95  BUN 34*  --  36* 38* 36*  CREATININE 1.3  --  1.10 1.04 1.07  CALCIUM 8.4  --   --  8.2* 8.3*   < > = values in this interval not displayed.   Recent Labs    09/11/18 0000 04/26/19 0534 04/27/19 0504  AST 18 24 22   ALT 12 20 23   ALKPHOS 44 36* 34*  BILITOT  --  0.6 0.6  PROT 5.8 5.4* 5.4*  ALBUMIN 3.5 2.8* 2.5*   Recent  Labs    04/26/19 0128 04/26/19 0128 04/26/19 0320 04/26/19 0534 04/27/19 0504  WBC 3.2*  --   --  2.8* 4.3  NEUTROABS 2.3  --   --  1.9 2.6  HGB 15.5   < > 14.3 11.9* 12.2*  HCT 50.0   < > 42.0 38.4* 38.3*  MCV 96.9  --   --  97.5 95.0  PLT 139*  --   --  123* 132*   < > = values in this interval not displayed.   Lab Results  Component Value Date   TSH 0.994 04/26/2019   Lab Results  Component Value Date   HGBA1C 5 01/26/2018   Lab Results  Component Value Date   CHOL 146 11/24/2018   HDL 38 11/24/2018   LDLCALC 89 11/24/2018   LDLDIRECT 132.5 09/08/2012   TRIG 91 11/24/2018   CHOLHDL 2.7 11/15/2016    Significant Diagnostic Results in last 30 days:  No results found.  Assessment/Plan Urge incontinence Stable, continue Tamsulosin.   Major psychotic depression, recurrent (HCC) Stable, continue Abilify, Sertraline, Mirtazapine, Clonazepam, Wellbutrin, f/u psychiatry.   Generalized osteoarthritis of multiple sites Stable, continue Tylenol.   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   Hypothyroidism Stable, continue Levothyroxine, TSH 0.994 04/26/19  Benign essential tremor Stable, continue Topamax.   Chronic constipation Stable, continue [employer], Linzess.   Allergic rhinitis Stable, continue Claritin.    Family/ staff Communication: plan of care reviewed with the patient and charge nurse.   Labs/tests ordered:  none  Time spend 25 minutes.

## 2019-06-01 NOTE — Assessment & Plan Note (Signed)
Stable, continue Abilify, Sertraline, Mirtazapine, Clonazepam, Wellbutrin, f/u psychiatry.

## 2019-06-01 NOTE — Assessment & Plan Note (Addendum)
Low Bp measurements, dc  Amlodipine.

## 2019-06-01 NOTE — Assessment & Plan Note (Signed)
Stable, continue Claritin.  

## 2019-06-01 NOTE — Assessment & Plan Note (Signed)
Stable, continue Topamax.  

## 2019-06-01 NOTE — Assessment & Plan Note (Signed)
Stable, continue [employer], Linzess.

## 2019-06-14 ENCOUNTER — Encounter: Payer: Self-pay | Admitting: Internal Medicine

## 2019-06-14 ENCOUNTER — Telehealth: Payer: Self-pay | Admitting: Internal Medicine

## 2019-06-14 ENCOUNTER — Non-Acute Institutional Stay (SKILLED_NURSING_FACILITY): Payer: Medicare Other | Admitting: Internal Medicine

## 2019-06-14 DIAGNOSIS — F333 Major depressive disorder, recurrent, severe with psychotic symptoms: Secondary | ICD-10-CM | POA: Diagnosis not present

## 2019-06-14 DIAGNOSIS — R634 Abnormal weight loss: Secondary | ICD-10-CM

## 2019-06-14 DIAGNOSIS — R441 Visual hallucinations: Secondary | ICD-10-CM

## 2019-06-14 NOTE — Telephone Encounter (Signed)
Talked to the Patient and his wife

## 2019-06-14 NOTE — Telephone Encounter (Signed)
Patient's wife [name] called.  She states that patient is hallucinating and that this is upsetting to patient.  Please call patient's wife ASAP  at [phone].  Thank you   [employer]

## 2019-06-14 NOTE — Progress Notes (Signed)
Location:  [facility] Room Number: 37-A Place of Service:  SNF [phone]) Provider:  [name], MD  Patient Care Team: [name], MD as PCP - General (Internal Medicine) [name], MD as Consulting Physician (Urology) [name], MD as Consulting Physician (Neurology) [name], MD as Consulting Physician (Dermatology) [name], MD (Inactive) as Consulting Physician (Gastroenterology) [name], MD as Consulting Physician (Orthopedic Surgery) [name], MD as Consulting Physician (Cardiology) [name], MD as Consulting Physician (Ophthalmology) [name], MD as Referring Physician (Psychiatry) [name], MD as Consulting Physician (General Surgery) [name], MD as Consulting Physician (Orthopedic Surgery) [place], [name], NP as Nurse Practitioner (Family Medicine) Mast, Man X, NP as Nurse Practitioner (Internal Medicine)  Extended Emergency Contact Information Primary Emergency Contact: Hietpas,[name] Address: [address]          [place], [place] [address] [place] Phone: [phone] Relation: Spouse Secondary Emergency Contact: Durwin Glaze Mobile Phone: [phone] Relation: Daughter  Code Status:  DNR Goals of care: Advanced Directive information Advanced Directives 06/01/2019  Does Patient Have a Medical Advance Directive? Yes  Type of Advance Directive Living will;Out of facility DNR (pink MOST or yellow form);Healthcare Power of Attorney  Does patient want to make changes to medical advance directive? No - Patient declined  Copy of [place] in Chart? Yes - validated most recent copy scanned in chart (See row information)  Pre-existing out of facility DNR order (yellow form or pink MOST form) Yellow form placed in chart (order not valid for inpatient use);Pink MOST form placed in chart (order not valid for inpatient use)       Chief Complaint  Patient presents with  . Acute Visit    Patient is seen for hallucinations.     HPI:  Pt is a 86 y.o. male seen today for an acute visit for Visual Hallucinations  He has h/o HTN, Hyperlipidemia, PAD, BPH , CKD stage 2, Hypothyroidism, and Major Depression Also Has h/o Cervial Stenosis with Compressive Myelopathy And recent CAP needing Hospitalization  He has h/o Hallucinations mostly Visual. Was started on Abilify by Dr Jannifer Franklin. But it has not helped.  Patient continues to have hallucinations mostly he states in the evening.   I had discussed this with his psychiatrist and his daughter. His psychiatrist had suggested MRI of his head as these hallucinations are  new for him   Patient was not sure of on my last visit if  he wanted to go through the imaging study but his wife states that his hallucinations are upsetting him.  he also has paranoia behavior including thinking that the staff is stealing stuff from him No other acute issues no fever chills coughing shortness of breath.  Past Medical History:  Diagnosis Date  . Acute urinary retention 01/28/2013  . AKI (acute kidney injury) ([place]) 01/26/2013  . Altered mental status 09/09/2013  . Anemia    B12 deficiency  . ANXIETY 06/07/2007   Qualifier: Diagnosis of  By: Talbert Cage CMA ([place]), June    . ARTHRITIS 06/07/2007   Qualifier: Diagnosis of  By: Talbert Cage CMA ([place]), June    . Balance problems 04/05/2011  . Benign essential tremor   . Benign fibroma of prostate 10/11/2011  . Bilateral inguinal hernia 10/09/2012  . Bilateral leg edema 09/13/2012  . Bladder retention 02/05/2013  . Bradycardia, sinus  08/02/2012  . CAROTID ARTERY DISEASE 03/31/2007   Qualifier: Diagnosis of  By: Linna Darner MD, Gwyndolyn Saxon    . Carotid stenosis    s/p L CEA  . Cataract, nuclear 03/21/2014  . 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 Cervical spinal stenosis   . Cervical stenosis of spine   . Critical lower limb ischemia   . Degeneration macular  11/02/2011  . Depression    Dr Albertine Patricia  . Difficulty in walking(719.7) 04/05/2011  . ESOPHAGITIS 08/06/2002   Qualifier: Diagnosis of  By: Talbert Cage CMA ([place]), June    . Essential and other specified forms of tremor 04/18/2013  . Fall at home 01/06/2017   left hip pain  . Falls   . Family history of colon cancer 07/24/2012  . Fecal impaction ([place]) 10/11/2012  . Femoral hernia, bilateral s/p lap repair 01/16/2013 01/16/2013  . Glaucoma, compensated 03/21/2014  . H/O cardiovascular stress test    a. 02/2003 -> no ischemia/infarct.  Marland Kitchen HAMMER TOE 04/23/2010   Qualifier: Diagnosis of  By: Linna Darner MD, Gwyndolyn Saxon    . Hematoma of leg   . Hip hematoma, right 09/09/2013  . History of shingles 2009  . HTN (hypertension) 08/22/2012  . Hx of echocardiogram    Echocardiogram 08/01/12: Mild LVH, EF 55-60%, normal wall motion.  . Hydrocele 10/11/2011  . Hyperlipidemia   . 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 Hypothyroidism   . Macular degeneration disease   . Macular edema   . Migraines    Dr Jannifer Franklin  . MORTON'S NEUROMA, LEFT 08/22/2007   Qualifier: Diagnosis of  By: Linna Darner MD, Gwyndolyn Saxon    . PAD (peripheral artery disease) ([place])   . Peripheral neuropathy   . Personal history of colonic polyps 01/05/2013   2014 2 mm adenomatous polyp   . PREMATURE ATRIAL CONTRACTIONS 03/14/2006   Qualifier: Diagnosis of  By: Linna Darner MD, Gwendolyn Lima 12/16/2011  . SPINAL STENOSIS 03/14/2006   Qualifier: Diagnosis of  By: Linna Darner MD, William   Cervical spine    . Testosterone deficiency    Dr Lawerance Bach, [facility]  . Unspecified vitamin D deficiency 08/06/2012  . Urge incontinence 10/09/2012  . Urinary tract infection, site not specified 02/13/2013  . Weakness 08/02/2012   Past Surgical History:  Procedure Laterality Date  . APPENDECTOMY  1941  . BREAST CYST EXCISION Left 01/16/2013   Procedure: MASS EXCISION AXILLA;  Surgeon: Adin Hector, MD;  Location: [facility];  Service: General;  Laterality: Left;  . CAROTID  ENDARTERECTOMY  2005    L  . CATARACT EXTRACTION Right   . COLONOSCOPY  2014   Dr. Deatra Ina  . EYE SURGERY Right 02/2012   retina  . INGUINAL HERNIA REPAIR Bilateral 01/16/2013   Procedure: LAPAROSCOPIC BILATERAL INGUINAL DIRECT AND INDIRECT, FEMORAL, AND OBTURATOR HERNIAS;  Surgeon: Adin Hector, MD;  Location: [facility];  Service: General;  Laterality: Bilateral;  . INSERTION OF MESH N/A 01/16/2013   Procedure: INSERTION OF MESH;  Surgeon: Adin Hector, MD;  Location: [facility];  Service: General;  Laterality: N/A;  . LUMBAR LAMINECTOMY    . TONSILLECTOMY AND ADENOIDECTOMY    . TOTAL HIP ARTHROPLASTY  2005   L    Allergies  Allergen Reactions  . Latex Rash  . Tape Rash    Paper tape is ok to use     Outpatient Encounter Medications as of 06/14/2019  Medication Sig  . acetaminophen (TYLENOL) 500 MG tablet Take 1,000 mg by mouth 3 (three)  times daily. And q6hprn for pain/fever  . albuterol (PROVENTIL) (2.5 MG/3ML) 0.083% nebulizer solution Take 2.5 mg by nebulization every 6 (six) hours as needed for wheezing or shortness of breath.  . Amino Acids-Protein Hydrolys (FEEDING SUPPLEMENT, PRO-STAT SUGAR FREE 64,) LIQD Take 30 mLs by mouth in the morning.  . ARIPiprazole (ABILIFY) 2 MG tablet Take 1 tablet (2 mg total) by mouth daily.  Marland Kitchen aspirin 81 MG tablet Take 81 mg by mouth every morning.   Marland Kitchen atorvastatin (LIPITOR) 20 MG tablet Take 20 mg by mouth daily.  . B Complex-C (B-COMPLEX WITH VITAMIN C) tablet Take 1 tablet by mouth daily.  . bimatoprost (LUMIGAN) 0.01 % SOLN Place 1 drop into both eyes at bedtime.  Marland Kitchen buPROPion (WELLBUTRIN XL) 300 MG 24 hr tablet Take 300 mg by mouth daily. per psychiatry recommendation  . Cholecalciferol (VITAMIN D3) 1000 units CAPS Take 1,000 Units by mouth daily.   . clonazePAM (KLONOPIN) 0.5 MG tablet Take 1 tablet (0.5 mg total) by mouth at bedtime.  Marland Kitchen dextromethorphan-guaiFENesin (ROBAFEN DM CGH/CHEST CONGEST) 10-100 MG/5ML liquid Take 10 mLs by mouth  every 6 (six) hours as needed for cough.  . erythromycin ophthalmic ointment Place 1 application into both eyes at bedtime.   Marland Kitchen ketoconazole (NIZORAL) 2 % cream Apply 1 application topically 2 (two) times daily as needed for irritation. Apply to buttocks.  . lactose free nutrition (BOOST) LIQD Take 237 mLs by mouth 2 (two) times daily between meals.  Marland Kitchen levothyroxine (SYNTHROID) 50 MCG tablet Take 50 mcg by mouth See admin instructions. Take daily except for Sundays 1/2 Tablet = 32mcg.  Marland Kitchen levothyroxine (SYNTHROID, LEVOTHROID) 50 MCG tablet Take 50 mcg by mouth See admin instructions. 7:30am on Mon, tues, wed, thur, fri, and Saturday.  . linaclotide (LINZESS) 145 MCG CAPS capsule Take 1 capsule (145 mcg total) by mouth daily before breakfast.  . loratadine (CLARITIN) 10 MG tablet Take 10 mg by mouth daily.  . Melatonin 3 MG TABS Take 3 mg by mouth at bedtime.   . mirtazapine (REMERON) 30 MG tablet Take 60 mg by mouth at bedtime.  . Multiple Vitamins-Minerals (ICAPS PO) Take 1 tablet by mouth daily.  Vladimir Faster Glycol-Propyl Glycol (SYSTANE) 0.4-0.3 % SOLN Apply 1 drop to eye 4 (four) times daily.  . sertraline (ZOLOFT) 100 MG tablet Take 100 mg by mouth 2 (two) times daily. In the morning. 200mg  in Total.  . tamsulosin (FLOMAX) 0.4 MG CAPS capsule Take 0.4 mg by mouth daily after supper.   . topiramate (TOPAMAX) 25 MG tablet Take 25 mg by mouth 2 (two) times daily.  Marland Kitchen zinc oxide 20 % ointment Apply 1 application topically as needed for irritation.  . Oral Electrolytes (PEDIALYTE PO) Take 8 oz by mouth daily.  . polyethylene glycol (MIRALAX / GLYCOLAX) packet Take 17 g by mouth daily. Hold for loose stool  . [DISCONTINUED] amLODipine (NORVASC) 5 MG tablet Take 2.5 mg by mouth daily.    No facility-administered encounter medications on file as of 06/14/2019.    Review of Systems  Constitutional: Negative.   HENT: Positive for postnasal drip and rhinorrhea.   Respiratory: Negative.     Cardiovascular: Negative.   Gastrointestinal: Negative.   Genitourinary: Negative.   Musculoskeletal: Negative.   Neurological: Negative.   Psychiatric/Behavioral: Positive for hallucinations. The patient is nervous/anxious.     Immunization History  Administered Date(s) Administered  . DTaP 11/05/2005  . Hepatitis B 06/10/1932  . Influenza Split 11/12/2011, 11/29/2013  .  Influenza Whole 10/30/2007, 11/12/2008, 11/26/2009  . Influenza, High Dose Seasonal PF 12/31/2015, 11/24/2016, 11/30/2018  . Influenza,inj,quad, With Preservative 10/16/2016  . Influenza-Unspecified 11/28/2017  . Moderna SARS-COVID-2 Vaccination 02/19/2019, 03/19/2019  . PPD Test 06/23/2011  . Pneumococcal Conjugate-13 01/14/2016  . Pneumococcal Polysaccharide-23 02/16/1999  . Tdap 01/13/2017  . Varicella 11/09/2005  . Zoster 08/30/2006   Pertinent  Health Maintenance Due  Topic Date Due  . INFLUENZA VACCINE  09/16/2019  . PNA vac Low Risk Adult  Completed   Fall Risk  10/28/2017 10/27/2016 06/09/2016 02/03/2016 10/28/2015  Falls in the past year? Yes Yes Yes Yes Yes  Number falls in past yr: 1 2 or more 2 or more 2 or more 2 or more  Comment - - - 2 falls in last 2 months -  Injury with Fall? Yes Yes No Yes No  Comment - L hip - scraps and bruises -  Risk Factor Category  - - - - -  Risk for fall due to : - - - - Impaired balance/gait  Follow up - - - - -   Functional Status Survey:    Vitals:   06/14/19 1507  BP: 128/71  Pulse: 75  Resp: (!) 21  Temp: (!) 97.5 F (36.4 C)  TempSrc: Oral  SpO2: 97%  Weight: 133 lb 3.2 oz (60.4 kg)  Height: 6' (1.829 m)   Body mass index is 18.07 kg/m. Physical Exam Vitals reviewed.  Constitutional:      Appearance: Normal appearance.  HENT:     Head: Normocephalic.     Nose: Nose normal.     Mouth/Throat:     Mouth: Mucous membranes are moist.     Pharynx: Oropharynx is clear.  Eyes:     Pupils: Pupils are equal, round, and reactive to light.   Cardiovascular:     Rate and Rhythm: Normal rate and regular rhythm.     Pulses: Normal pulses.  Pulmonary:     Effort: Pulmonary effort is normal.     Breath sounds: Normal breath sounds.  Abdominal:     General: Abdomen is flat. Bowel sounds are normal.     Palpations: Abdomen is soft.  Musculoskeletal:        General: No swelling.     Cervical back: Neck supple.     Comments: Has Feeble Pulses  Skin:    General: Skin is warm and dry.  Neurological:     General: No focal deficit present.     Mental Status: He is alert and oriented to person, place, and time.  Psychiatric:     Comments: Continues to struggle with his depression and Paranoia     Labs reviewed: Recent Labs    09/11/18 0000 09/11/18 0000 04/26/19 0320 04/26/19 0534 04/27/19 0504  NA 142  --  143 143 141  K 4.1   < > 3.7 3.4* 4.4  CL 112  --  109 114* 110  CO2 23  --   --  23 22  GLUCOSE  --   --  115* 114* 95  BUN 34*  --  36* 38* 36*  CREATININE 1.3  --  1.10 1.04 1.07  CALCIUM 8.4  --   --  8.2* 8.3*   < > = values in this interval not displayed.   Recent Labs    09/11/18 0000 04/26/19 0534 04/27/19 0504  AST 18 24 22   ALT 12 20 23   ALKPHOS 44 36* 34*  BILITOT  --  0.6  0.6  PROT 5.8 5.4* 5.4*  ALBUMIN 3.5 2.8* 2.5*   Recent Labs    04/26/19 0128 04/26/19 0128 04/26/19 0320 04/26/19 0534 04/27/19 0504  WBC 3.2*  --   --  2.8* 4.3  NEUTROABS 2.3  --   --  1.9 2.6  HGB 15.5   < > 14.3 11.9* 12.2*  HCT 50.0   < > 42.0 38.4* 38.3*  MCV 96.9  --   --  97.5 95.0  PLT 139*  --   --  123* 132*   < > = values in this interval not displayed.   Lab Results  Component Value Date   TSH 0.994 04/26/2019   Lab Results  Component Value Date   HGBA1C 5 01/26/2018   Lab Results  Component Value Date   CHOL 146 11/24/2018   HDL 38 11/24/2018   LDLCALC 89 11/24/2018   LDLDIRECT 132.5 09/08/2012   TRIG 91 11/24/2018   CHOLHDL 2.7 11/15/2016    Significant Diagnostic Results in last 30  days:  No results found.  Assessment/Plan  Hallucinations, visual Had d/w patient again today His wife was in the room Will order MRI of his head Also Taper Abilify and start on Seroquel 12.5 mg BID to increase as needed   Major psychotic depression, recurrent ([place]) Continues to be issue with him On high dose of Zoloft Remeron, Wellbutrin, and Klonipin  Weight loss Weight has stabilized since his Topamax reduced in Dose  Hypertension Taken off Norvasc BP stable PAD Had arterial studies done in the facility which showed moderate disease bilateral Not a candidate for any extensive work-up Is asymptomatic at this time  Other issues  Acquired hypothyroidism TSH normal levels  Chronic constipation Doing well on Linzess  Stage 3 chronic kidney disease, unspecified whether stage 3a or 3b CKD Creatinine stable Hyperlipidemia On Lipitor Hand tremors Doing okay on lower dose of Topamax   Family/ staff Communication:   Labs/tests ordered:

## 2019-06-15 ENCOUNTER — Other Ambulatory Visit: Payer: Self-pay | Admitting: Internal Medicine

## 2019-06-19 ENCOUNTER — Other Ambulatory Visit: Payer: Self-pay | Admitting: *Deleted

## 2019-06-19 MED ORDER — CLONAZEPAM 0.5 MG PO TABS: 0.5000 mg | ORAL_TABLET | Freq: Every day | ORAL | 0 refills | Status: DC

## 2019-06-19 NOTE — Telephone Encounter (Signed)
Received fax from [facility] and sent to Dr. Gupta for approval.  

## 2019-06-21 ENCOUNTER — Encounter: Payer: Self-pay | Admitting: Nurse Practitioner

## 2019-06-21 ENCOUNTER — Non-Acute Institutional Stay (SKILLED_NURSING_FACILITY): Payer: Medicare Other | Admitting: Nurse Practitioner

## 2019-06-21 DIAGNOSIS — M159 Polyosteoarthritis, unspecified: Secondary | ICD-10-CM

## 2019-06-21 DIAGNOSIS — K5909 Other constipation: Secondary | ICD-10-CM

## 2019-06-21 DIAGNOSIS — E039 Hypothyroidism, unspecified: Secondary | ICD-10-CM

## 2019-06-21 DIAGNOSIS — N138 Other obstructive and reflux uropathy: Secondary | ICD-10-CM

## 2019-06-21 DIAGNOSIS — G250 Essential tremor: Secondary | ICD-10-CM

## 2019-06-21 DIAGNOSIS — N401 Enlarged prostate with lower urinary tract symptoms: Secondary | ICD-10-CM

## 2019-06-21 DIAGNOSIS — F333 Major depressive disorder, recurrent, severe with psychotic symptoms: Secondary | ICD-10-CM | POA: Diagnosis not present

## 2019-06-21 NOTE — Progress Notes (Signed)
Location:   S[facility] Room Number: 37 Place of Service:  SNF (31) Provider: [facility] Haidyn Chadderdon NP  [name], MD  Patient Care Team: [name], MD as PCP - General (Internal Medicine) [name], MD as Consulting Physician (Urology) [name], MD as Consulting Physician (Neurology) [name], MD as Consulting Physician (Dermatology) [name], MD (Inactive) as Consulting Physician (Gastroenterology) [name], MD as Consulting Physician (Orthopedic Surgery) [name], MD as Consulting Physician (Cardiology) [name], MD as Consulting Physician (Ophthalmology) [name], MD as Referring Physician (Psychiatry) [name], MD as Consulting Physician (General Surgery) [name], MD as Consulting Physician (Orthopedic Surgery) [place], [name], NP as Nurse Practitioner (Family Medicine) Desirai Traxler X, NP as Nurse Practitioner (Internal Medicine)  Extended Emergency Contact Information Primary Emergency Contact: Busser,[name] Address: [address]          [place]          [place], [place] [address] [place] Phone: [phone] Relation: Spouse Secondary Emergency Contact: Durwin Glaze Mobile Phone: [phone] Relation: Daughter  Code Status:  DNR Goals of care: Advanced Directive information Advanced Directives 06/14/2019  Does Patient Have a Medical Advance Directive? Yes  Type of Advance Directive Out of facility DNR (pink MOST or yellow form)  Does patient want to make changes to medical advance directive? No - Patient declined  Copy of [place] in Chart? Yes - validated most recent copy scanned in chart (See row information)  Pre-existing out of facility DNR order (yellow form or pink MOST form) Pink MOST form placed in chart (order not valid for inpatient use)     Chief Complaint  Patient presents with  . Medical Management of Chronic Issues    HPI:   Pt is a 86 y.o. male seen today for medical management of chronic diseases.    The patient resides in SNF [facility] for safety, care assistance, w/c for mobility, his mood is managed, on Sertralien 100mg  bid, Mirtazapine 60mg  qd, Clonazepam 0.5mg  qhs, Bupropion 300mg  qd, Abilify tapering 06/14/19 2mg  qd x7 days, then 1mg  qd x 7 days then dc, started Quetiapine 12.5mg  bid, staff reported less hallucination in the past 2 days,  pending MRI brain in setting of hallucination.   Hx of OA, stable, on Tylenol 1000mg  tid. Hypothyroidism, stable, on Levothyroxine 79mcg qd 6x/wk, 15mcg qd 1x/wk. Constipation, stable, on Linzess qd, MiraLax qd. BPH, stable, on Tamsulosin. Essential tremor, on Topamax BID.   Past Medical History:  Diagnosis Date  . Acute urinary retention 01/28/2013  . AKI (acute kidney injury) ([place]) 01/26/2013  . Altered mental status 09/09/2013  . Anemia    B12 deficiency  . ANXIETY 06/07/2007   Qualifier: Diagnosis of  By: Talbert Cage CMA ([place]), June    . ARTHRITIS 06/07/2007   Qualifier: Diagnosis of  By: Talbert Cage CMA ([place]), June    . Balance problems 04/05/2011  . Benign essential tremor   . Benign fibroma of prostate 10/11/2011  . Bilateral inguinal hernia 10/09/2012  . Bilateral leg edema 09/13/2012  . Bladder retention 02/05/2013  . Bradycardia, sinus 08/02/2012  . CAROTID ARTERY DISEASE 03/31/2007   Qualifier: Diagnosis of  By: Linna Darner MD, Gwyndolyn Saxon    . Carotid stenosis    s/p L CEA  . Cataract, nuclear 03/21/2014  . 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 Cervical spinal stenosis   . Cervical stenosis of spine   . Critical lower limb ischemia   .  Degeneration macular 11/02/2011  . Depression    Dr Albertine Patricia  . Difficulty in walking(719.7) 04/05/2011  . ESOPHAGITIS 08/06/2002   Qualifier: Diagnosis of  By: Talbert Cage CMA ([place]), June    . Essential and other specified forms of tremor 04/18/2013  . Fall at home 01/06/2017   left hip pain  . Falls   . Family history of colon cancer 07/24/2012  .  Fecal impaction ([place]) 10/11/2012  . Femoral hernia, bilateral s/p lap repair 01/16/2013 01/16/2013  . Glaucoma, compensated 03/21/2014  . H/O cardiovascular stress test    a. 02/2003 -> no ischemia/infarct.  Marland Kitchen HAMMER TOE 04/23/2010   Qualifier: Diagnosis of  By: Linna Darner MD, Gwyndolyn Saxon    . Hematoma of leg   . Hip hematoma, right 09/09/2013  . History of shingles 2009  . HTN (hypertension) 08/22/2012  . Hx of echocardiogram    Echocardiogram 08/01/12: Mild LVH, EF 55-60%, normal wall motion.  . Hydrocele 10/11/2011  . Hyperlipidemia   . 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 Hypothyroidism   . Macular degeneration disease   . Macular edema   . Migraines    Dr Jannifer Franklin  . MORTON'S NEUROMA, LEFT 08/22/2007   Qualifier: Diagnosis of  By: Linna Darner MD, Gwyndolyn Saxon    . PAD (peripheral artery disease) ([place])   . Peripheral neuropathy   . Personal history of colonic polyps 01/05/2013   2014 2 mm adenomatous polyp   . PREMATURE ATRIAL CONTRACTIONS 03/14/2006   Qualifier: Diagnosis of  By: Linna Darner MD, Gwendolyn Lima 12/16/2011  . SPINAL STENOSIS 03/14/2006   Qualifier: Diagnosis of  By: Linna Darner MD, William   Cervical spine    . Testosterone deficiency    Dr Lawerance Bach, [facility]  . Unspecified vitamin D deficiency 08/06/2012  . Urge incontinence 10/09/2012  . Urinary tract infection, site not specified 02/13/2013  . Weakness 08/02/2012   Past Surgical History:  Procedure Laterality Date  . APPENDECTOMY  1941  . BREAST CYST EXCISION Left 01/16/2013   Procedure: MASS EXCISION AXILLA;  Surgeon: Adin Hector, MD;  Location: [facility];  Service: General;  Laterality: Left;  . CAROTID ENDARTERECTOMY  2005    L  . CATARACT EXTRACTION Right   . COLONOSCOPY  2014   Dr. Deatra Ina  . EYE SURGERY Right 02/2012   retina  . INGUINAL HERNIA REPAIR Bilateral 01/16/2013   Procedure: LAPAROSCOPIC BILATERAL INGUINAL DIRECT AND INDIRECT, FEMORAL, AND OBTURATOR HERNIAS;  Surgeon: Adin Hector, MD;  Location: [facility];  Service:  General;  Laterality: Bilateral;  . INSERTION OF MESH N/A 01/16/2013   Procedure: INSERTION OF MESH;  Surgeon: Adin Hector, MD;  Location: [facility];  Service: General;  Laterality: N/A;  . LUMBAR LAMINECTOMY    . TONSILLECTOMY AND ADENOIDECTOMY    . TOTAL HIP ARTHROPLASTY  2005   L    Allergies  Allergen Reactions  . Latex Rash  . Tape Rash    Paper tape is ok to use     Allergies as of 06/21/2019      Reactions   Latex Rash   Tape Rash   Paper tape is ok to use      Medication List       Accurate as of Jun 21, 2019 11:59 PM. If you have any questions, ask your nurse or doctor.        acetaminophen 500 MG tablet Commonly known as: TYLENOL Take 1,000 mg by mouth 3 (three) times daily. And q6hprn for  pain/fever   albuterol (2.5 MG/3ML) 0.083% nebulizer solution Commonly known as: PROVENTIL Take 2.5 mg by nebulization every 6 (six) hours as needed for wheezing or shortness of breath.   ARIPiprazole 2 MG tablet Commonly known as: ABILIFY Take 2 mg by mouth daily. What changed: Another medication with the same name was removed. Continue taking this medication, and follow the directions you see here. Changed by: Goerge Mohr X Demeshia [place], NP   ARIPiprazole 2 MG tablet Commonly known as: ABILIFY Take 2 mg by mouth every other day. Start taking on: Jun 23, 2019 What changed: Another medication with the same name was removed. Continue taking this medication, and follow the directions you see here. Changed by: Darean Rote X Basilia Stuckert, NP   aspirin 81 MG tablet Take 81 mg by mouth every morning.   atorvastatin 20 MG tablet Commonly known as: LIPITOR Take 20 mg by mouth daily.   B-complex with vitamin C tablet Take 1 tablet by mouth daily.   bimatoprost 0.01 % Soln Commonly known as: LUMIGAN Place 1 drop into both eyes at bedtime.   buPROPion 300 MG 24 hr tablet Commonly known as: WELLBUTRIN XL Take 300 mg by mouth daily. per psychiatry recommendation   clonazePAM 0.5 MG tablet Commonly  known as: KLONOPIN Take 1 tablet (0.5 mg total) by mouth at bedtime.   erythromycin ophthalmic ointment Place 1 application into both eyes at bedtime.   feeding supplement (PRO-STAT SUGAR FREE 64) Liqd Take 30 mLs by mouth in the morning.   ICAPS PO Take 1 tablet by mouth daily.   ketoconazole 2 % cream Commonly known as: NIZORAL Apply 1 application topically 2 (two) times daily as needed for irritation. Apply to buttocks.   lactose free nutrition Liqd Take 237 mLs by mouth 2 (two) times daily between meals.   BOOST PO Take 1 each by mouth daily. Milkshake with HS snack   levothyroxine 50 MCG tablet Commonly known as: SYNTHROID Take 50 mcg by mouth See admin instructions. 7:30am on Mon, tues, wed, thur, fri, and Saturday.   levothyroxine 50 MCG tablet Commonly known as: SYNTHROID Take 50 mcg by mouth See admin instructions. Take daily except for Sundays 1/2 Tablet = 30mcg.   linaclotide 145 MCG Caps capsule Commonly known as: LINZESS Take 1 capsule (145 mcg total) by mouth daily before breakfast.   loratadine 10 MG tablet Commonly known as: CLARITIN Take 10 mg by mouth daily.   melatonin 3 MG Tabs tablet Take 3 mg by mouth at bedtime.   mirtazapine 30 MG tablet Commonly known as: REMERON Take 60 mg by mouth at bedtime.   polyethylene glycol 17 g packet Commonly known as: MIRALAX / GLYCOLAX Take 17 g by mouth daily. Hold for loose stool   QUEtiapine 25 MG tablet Commonly known as: SEROQUEL Take 12.5 mg by mouth 2 (two) times daily.   Robafen DM Cgh/Chest Congest 10-100 MG/5ML liquid Generic drug: dextromethorphan-guaiFENesin Take 10 mLs by mouth every 6 (six) hours as needed for cough.   sertraline 100 MG tablet Commonly known as: ZOLOFT Take 100 mg by mouth 2 (two) times daily. In the morning. 200mg  in Total.   Systane 0.4-0.3 % Soln Generic drug: Polyethyl Glycol-Propyl Glycol Apply 1 drop to eye 4 (four) times daily.   tamsulosin 0.4 MG Caps capsule  Commonly known as: FLOMAX Take 0.4 mg by mouth daily after supper.   topiramate 25 MG tablet Commonly known as: TOPAMAX Take 25 mg by mouth 2 (two) times daily.   Vitamin  D3 25 MCG (1000 UT) Caps Take 1,000 Units by mouth daily.   zinc oxide 20 % ointment Apply 1 application topically as needed for irritation.       Review of Systems  Constitutional: Negative for activity change, appetite change, fatigue, fever and unexpected weight change.  HENT: Positive for hearing loss. Negative for congestion and voice change.   Eyes: Positive for visual disturbance.  Respiratory: Negative for cough and shortness of breath.   Cardiovascular: Negative for leg swelling.  Gastrointestinal: Negative for abdominal pain and constipation.  Genitourinary: Negative for difficulty urinating and dysuria.       Incontinent of urine.   Musculoskeletal: Positive for arthralgias and gait problem.  Skin: Negative for color change.  Neurological: Negative for dizziness, tremors, speech difficulty, weakness and headaches.       Memory lapses. Fine tremor in fingers.   Psychiatric/Behavioral: Positive for hallucinations. Negative for agitation, behavioral problems, dysphoric mood and sleep disturbance.       ? Paranoid delusions    Immunization History  Administered Date(s) Administered  . DTaP 11/05/2005  . Hepatitis B 1932-06-24  . Influenza Split 11/12/2011, 11/29/2013  . Influenza Whole 10/30/2007, 11/12/2008, 11/26/2009  . Influenza, High Dose Seasonal PF 12/31/2015, 11/24/2016, 11/30/2018  . Influenza,inj,quad, With Preservative 10/16/2016  . Influenza-Unspecified 11/28/2017  . Moderna SARS-COVID-2 Vaccination 02/19/2019, 03/19/2019  . PPD Test 06/23/2011  . Pneumococcal Conjugate-13 01/14/2016  . Pneumococcal Polysaccharide-23 02/16/1999  . Tdap 01/13/2017  . Varicella 11/09/2005  . Zoster 08/30/2006   Pertinent  Health Maintenance Due  Topic Date Due  . INFLUENZA VACCINE  09/16/2019  .  PNA vac Low Risk Adult  Completed   Fall Risk  10/28/2017 10/27/2016 06/09/2016 02/03/2016 10/28/2015  Falls in the past year? Yes Yes Yes Yes Yes  Number falls in past yr: 1 2 or more 2 or more 2 or more 2 or more  Comment - - - 2 falls in last 2 months -  Injury with Fall? Yes Yes No Yes No  Comment - L hip - scraps and bruises -  Risk Factor Category  - - - - -  Risk for fall due to : - - - - Impaired balance/gait  Follow up - - - - -   Functional Status Survey:    Vitals:   06/21/19 1533  BP: 125/68  Pulse: 60  Resp: 18  Temp: (!) 97.4 F (36.3 C)  SpO2: 96%  Weight: 133 lb 8 oz (60.6 kg)  Height: 6' (1.829 m)   Body mass index is 18.11 kg/m. Physical Exam Vitals and nursing note reviewed.  Constitutional:      Appearance: Normal appearance.  HENT:     Head: Normocephalic and atraumatic.     Mouth/Throat:     Mouth: Mucous membranes are moist.  Eyes:     Extraocular Movements: Extraocular movements intact.     Conjunctiva/sclera: Conjunctivae normal.     Pupils: Pupils are equal, round, and reactive to light.     Comments: Left eye blind.   Cardiovascular:     Rate and Rhythm: Normal rate and regular rhythm.     Heart sounds: No murmur.  Pulmonary:     Effort: Pulmonary effort is normal.     Breath sounds: No rales.  Abdominal:     General: Bowel sounds are normal.     Palpations: Abdomen is soft.     Tenderness: There is no abdominal tenderness.  Musculoskeletal:     Cervical back: Normal  range of motion and neck supple.     Right lower leg: No edema.     Left lower leg: No edema.  Skin:    General: Skin is warm and dry.     Comments: Sacral wound is covered in dressing.   Neurological:     General: No focal deficit present.     Mental Status: He is alert. Mental status is at baseline.     Gait: Gait abnormal.     Comments: Oriented to person, place. Fine, mild resting tremor in fingers, not disabling.   Psychiatric:     Comments: Less hallucination,  delusion     Labs reviewed: Recent Labs    09/11/18 0000 09/11/18 0000 04/26/19 0320 04/26/19 0534 04/27/19 0504  NA 142  --  143 143 141  K 4.1   < > 3.7 3.4* 4.4  CL 112  --  109 114* 110  CO2 23  --   --  23 22  GLUCOSE  --   --  115* 114* 95  BUN 34*  --  36* 38* 36*  CREATININE 1.3  --  1.10 1.04 1.07  CALCIUM 8.4  --   --  8.2* 8.3*   < > = values in this interval not displayed.   Recent Labs    09/11/18 0000 04/26/19 0534 04/27/19 0504  AST 18 24 22   ALT 12 20 23   ALKPHOS 44 36* 34*  BILITOT  --  0.6 0.6  PROT 5.8 5.4* 5.4*  ALBUMIN 3.5 2.8* 2.5*   Recent Labs    04/26/19 0128 04/26/19 0128 04/26/19 0320 04/26/19 0534 04/27/19 0504  WBC 3.2*  --   --  2.8* 4.3  NEUTROABS 2.3  --   --  1.9 2.6  HGB 15.5   < > 14.3 11.9* 12.2*  HCT 50.0   < > 42.0 38.4* 38.3*  MCV 96.9  --   --  97.5 95.0  PLT 139*  --   --  123* 132*   < > = values in this interval not displayed.   Lab Results  Component Value Date   TSH 0.994 04/26/2019   Lab Results  Component Value Date   HGBA1C 5 01/26/2018   Lab Results  Component Value Date   CHOL 146 11/24/2018   HDL 38 11/24/2018   LDLCALC 89 11/24/2018   LDLDIRECT 132.5 09/08/2012   TRIG 91 11/24/2018   CHOLHDL 2.7 11/15/2016    Significant Diagnostic Results in last 30 days:  No results found.  Assessment/Plan Major psychotic depression, recurrent (HCC) Continue Sertralien 100mg  bid, Mirtazapine 60mg  qd, Clonazepam 0.5mg  qhs, Bupropion 300mg  qd, Abilify tapering 06/14/19 2mg  qd x7 days, then 1mg  qd x 7 days then dc, started Quetiapine 12.5mg  bid, staff reported less hallucination in the past 2 days,  pending MRI brain in setting of hallucination.    Chronic constipation  stable, continue Linzess qd, MiraLax qd  Benign prostatic hyperplasia with urinary obstruction stable, continue Tamsulosin.  Generalized osteoarthritis of multiple sites Stable, continue Tylenol.   Benign essential tremor Stable,  continue Topamax.   Hypothyroidism Stable, continue Levothyroxine, TSH 0.994 04/26/19   Family/ staff Communication: plan of care reviewed with the patient and charge nurse.   Labs/tests ordered:  none  Time spend 25 minutes.

## 2019-06-21 NOTE — Assessment & Plan Note (Signed)
Stable, continue Levothyroxine, TSH 0.994 04/26/19

## 2019-06-21 NOTE — Assessment & Plan Note (Signed)
Continue Sertralien 100mg  bid, Mirtazapine 60mg  qd, Clonazepam 0.5mg  qhs, Bupropion 300mg  qd, Abilify tapering 06/14/19 2mg  qd x7 days, then 1mg  qd x 7 days then dc, started Quetiapine 12.5mg  bid, staff reported less hallucination in the past 2 days,  pending MRI brain in setting of hallucination.

## 2019-06-21 NOTE — Assessment & Plan Note (Signed)
stable, continue Tamsulosin.

## 2019-06-21 NOTE — Assessment & Plan Note (Signed)
stable, continue Linzess qd, MiraLax qd

## 2019-06-21 NOTE — Assessment & Plan Note (Signed)
Stable, continue Tylenol.  

## 2019-06-21 NOTE — Assessment & Plan Note (Signed)
Stable, continue Topamax.  

## 2019-06-22 ENCOUNTER — Encounter: Payer: Self-pay | Admitting: Nurse Practitioner

## 2019-06-26 ENCOUNTER — Other Ambulatory Visit: Payer: Self-pay | Admitting: Internal Medicine

## 2019-06-26 DIAGNOSIS — R443 Hallucinations, unspecified: Secondary | ICD-10-CM

## 2019-07-08 ENCOUNTER — Other Ambulatory Visit: Payer: Medicare Other

## 2019-07-30 ENCOUNTER — Ambulatory Visit
Admission: RE | Admit: 2019-07-30 | Discharge: 2019-07-30 | Disposition: A | Discharge status: Home / Self Care | Payer: Medicare Other | Source: Ambulatory Visit | Attending: Internal Medicine | Admitting: Internal Medicine

## 2019-07-30 DIAGNOSIS — R443 Hallucinations, unspecified: Secondary | ICD-10-CM

## 2019-07-30 MED ORDER — GADOBENATE DIMEGLUMINE 529 MG/ML IV SOLN
12.0000 mL | Freq: Once | INTRAVENOUS | Status: AC | PRN
  Administered 2019-07-30: 12 mL via INTRAVENOUS

## 2019-08-13 ENCOUNTER — Non-Acute Institutional Stay (SKILLED_NURSING_FACILITY): Payer: Medicare Other | Admitting: Internal Medicine

## 2019-08-13 ENCOUNTER — Encounter: Payer: Self-pay | Admitting: Internal Medicine

## 2019-08-13 DIAGNOSIS — R441 Visual hallucinations: Secondary | ICD-10-CM

## 2019-08-13 DIAGNOSIS — F333 Major depressive disorder, recurrent, severe with psychotic symptoms: Secondary | ICD-10-CM

## 2019-08-13 DIAGNOSIS — R634 Abnormal weight loss: Secondary | ICD-10-CM

## 2019-08-13 DIAGNOSIS — I1310 Hypertensive heart and chronic kidney disease without heart failure, with stage 1 through stage 4 chronic kidney disease, or unspecified chronic kidney disease: Secondary | ICD-10-CM

## 2019-08-13 DIAGNOSIS — G250 Essential tremor: Secondary | ICD-10-CM

## 2019-08-13 NOTE — Progress Notes (Signed)
Location:    [facility] Room Number: 37 Place of Service:  SNF [phone]) Provider:  Veleta Miners MD   [name], MD  Patient Care Team: [name], MD as PCP - General (Internal Medicine) [name], MD as Consulting Physician (Urology) [name], MD as Consulting Physician (Neurology) [name], MD as Consulting Physician (Dermatology) [name], MD (Inactive) as Consulting Physician (Gastroenterology) [name], MD as Consulting Physician (Orthopedic Surgery) [name], MD as Consulting Physician (Cardiology) [name], MD as Consulting Physician (Ophthalmology) [name], MD as Referring Physician (Psychiatry) [name], MD as Consulting Physician (General Surgery) [name], MD as Consulting Physician (Orthopedic Surgery) [place], [name], NP as Nurse Practitioner (Family Medicine) Mast, Man X, NP as Nurse Practitioner (Internal Medicine)  Extended Emergency Contact Information Primary Emergency Contact: [facility] Address: [address]          [place], [place] [address] [place] Phone: [phone] Relation: Spouse Secondary Emergency Contact: Durwin Glaze Mobile Phone: [phone] Relation: Daughter  Code Status:  DNR Goals of care: Advanced Directive information Advanced Directives 06/14/2019  Does Patient Have a Medical Advance Directive? Yes  Type of Advance Directive Out of facility DNR (pink MOST or yellow form)  Does patient want to make changes to medical advance directive? No - Patient declined  Copy of [place] in Chart? Yes - validated most recent copy scanned in chart (See row information)  Pre-existing out of facility DNR order (yellow form or pink MOST form) Pink MOST form placed in chart (order not valid for inpatient use)     Chief Complaint  Patient presents with  . Medical Management of Chronic  Issues    HPI:  Pt is a 86 y.o. male seen today for medical management of chronic diseases.    He has h/o HTN, Hyperlipidemia, PAD, BPH , CKD stage 2, Hypothyroidism, and Major Depression Also Has h/o Cervial Stenosis with Compressive Myelopathy And recent CAP needing Hospitalization  Per Nurses he is stable. Just get Upset when his family does not call him or come and see him He did not have any Acute complains today. His weight is stable  His daughter called me after the visit and said that he called her and said that his son her brother is involved in some Shoot out. This is not true and his daughter is really worried that he is having hallucinations again. Per Nurses he only tells this to them. No Fever or other Acute symptoms Past Medical History:  Diagnosis Date  . Acute urinary retention 01/28/2013  . AKI (acute kidney injury) ([place]) 01/26/2013  . Altered mental status 09/09/2013  . Anemia    B12 deficiency  . ANXIETY 06/07/2007   Qualifier: Diagnosis of  By: Talbert Cage CMA ([place]), June    . ARTHRITIS 06/07/2007   Qualifier: Diagnosis of  By: Talbert Cage CMA ([place]), June    . Balance problems 04/05/2011  . Benign essential tremor   . Benign fibroma of prostate 10/11/2011  . Bilateral inguinal hernia 10/09/2012  . Bilateral leg edema 09/13/2012  . Bladder retention 02/05/2013  . Bradycardia, sinus 08/02/2012  . CAROTID ARTERY DISEASE 03/31/2007   Qualifier: Diagnosis of  By: Linna Darner MD, Gwyndolyn Saxon    . Carotid stenosis    s/p L CEA  . Cataract, nuclear 03/21/2014  . 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  Cervical spinal stenosis   . Cervical stenosis of spine   . Critical lower limb ischemia   . Degeneration macular 11/02/2011  . Depression    Dr Albertine Patricia  . Difficulty in walking(719.7) 04/05/2011  . ESOPHAGITIS 08/06/2002   Qualifier: Diagnosis of  By: Talbert Cage CMA ([place]), June    . Essential and other specified forms of tremor 04/18/2013  . Fall at home 01/06/2017   left hip pain  . Falls    . Family history of colon cancer 07/24/2012  . Fecal impaction ([place]) 10/11/2012  . Femoral hernia, bilateral s/p lap repair 01/16/2013 01/16/2013  . Glaucoma, compensated 03/21/2014  . H/O cardiovascular stress test    a. 02/2003 -> no ischemia/infarct.  Marland Kitchen HAMMER TOE 04/23/2010   Qualifier: Diagnosis of  By: Linna Darner MD, Gwyndolyn Saxon    . Hematoma of leg   . Hip hematoma, right 09/09/2013  . History of shingles 2009  . HTN (hypertension) 08/22/2012  . Hx of echocardiogram    Echocardiogram 08/01/12: Mild LVH, EF 55-60%, normal wall motion.  . Hydrocele 10/11/2011  . Hyperlipidemia   . 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 Hypothyroidism   . Macular degeneration disease   . Macular edema   . Migraines    Dr Jannifer Franklin  . MORTON'S NEUROMA, LEFT 08/22/2007   Qualifier: Diagnosis of  By: Linna Darner MD, Gwyndolyn Saxon    . PAD (peripheral artery disease) ([place])   . Peripheral neuropathy   . Personal history of colonic polyps 01/05/2013   2014 2 mm adenomatous polyp   . PREMATURE ATRIAL CONTRACTIONS 03/14/2006   Qualifier: Diagnosis of  By: Linna Darner MD, Gwendolyn Lima 12/16/2011  . SPINAL STENOSIS 03/14/2006   Qualifier: Diagnosis of  By: Linna Darner MD, William   Cervical spine    . Testosterone deficiency    Dr Lawerance Bach, [facility]  . Unspecified vitamin D deficiency 08/06/2012  . Urge incontinence 10/09/2012  . Urinary tract infection, site not specified 02/13/2013  . Weakness 08/02/2012   Past Surgical History:  Procedure Laterality Date  . APPENDECTOMY  1941  . BREAST CYST EXCISION Left 01/16/2013   Procedure: MASS EXCISION AXILLA;  Surgeon: Adin Hector, MD;  Location: [facility];  Service: General;  Laterality: Left;  . CAROTID ENDARTERECTOMY  2005    L  . CATARACT EXTRACTION Right   . COLONOSCOPY  2014   Dr. Deatra Ina  . EYE SURGERY Right 02/2012   retina  . INGUINAL HERNIA REPAIR Bilateral 01/16/2013   Procedure: LAPAROSCOPIC BILATERAL INGUINAL DIRECT AND INDIRECT, FEMORAL, AND OBTURATOR HERNIAS;  Surgeon:  Adin Hector, MD;  Location: [facility];  Service: General;  Laterality: Bilateral;  . INSERTION OF MESH N/A 01/16/2013   Procedure: INSERTION OF MESH;  Surgeon: Adin Hector, MD;  Location: [facility];  Service: General;  Laterality: N/A;  . LUMBAR LAMINECTOMY    . TONSILLECTOMY AND ADENOIDECTOMY    . TOTAL HIP ARTHROPLASTY  2005   L    Allergies  Allergen Reactions  . Latex Rash  . Tape Rash    Paper tape is ok to use     Allergies as of 08/13/2019      Reactions   Latex Rash   Tape Rash   Paper tape is ok to use      Medication List       Accurate as of August 13, 2019 10:44 AM. If you have any questions, ask your nurse or doctor.  acetaminophen 500 MG tablet Commonly known as: TYLENOL Take 1,000 mg by mouth 3 (three) times daily. And q6hprn for pain/fever   albuterol (2.5 MG/3ML) 0.083% nebulizer solution Commonly known as: PROVENTIL Take 2.5 mg by nebulization every 6 (six) hours as needed for wheezing or shortness of breath.   aspirin 81 MG tablet Take 81 mg by mouth every morning.   atorvastatin 20 MG tablet Commonly known as: LIPITOR Take 20 mg by mouth daily.   B-complex with vitamin C tablet Take 1 tablet by mouth daily.   bimatoprost 0.01 % Soln Commonly known as: LUMIGAN Place 1 drop into both eyes at bedtime.   buPROPion 300 MG 24 hr tablet Commonly known as: WELLBUTRIN XL Take 300 mg by mouth daily. per psychiatry recommendation   clonazePAM 0.5 MG tablet Commonly known as: KLONOPIN Take 1 tablet (0.5 mg total) by mouth at bedtime.   erythromycin ophthalmic ointment Place 1 application into both eyes at bedtime.   feeding supplement (PRO-STAT SUGAR FREE 64) Liqd Take 30 mLs by mouth in the morning.   ICAPS PO Take 1 tablet by mouth daily.   ketoconazole 2 % cream Commonly known as: NIZORAL Apply 1 application topically 2 (two) times daily as needed for irritation. Apply to buttocks.   lactose free nutrition Liqd Take 237 mLs by  mouth 2 (two) times daily between meals.   BOOST PO Take 1 each by mouth daily. Milkshake with HS snack   levothyroxine 50 MCG tablet Commonly known as: SYNTHROID Take 50 mcg by mouth See admin instructions. 7:30am on Mon, tues, wed, thur, fri, and Saturday.   levothyroxine 50 MCG tablet Commonly known as: SYNTHROID Take 50 mcg by mouth See admin instructions. Take daily except for Sundays 1/2 Tablet = 52mcg.   linaclotide 145 MCG Caps capsule Commonly known as: LINZESS Take 1 capsule (145 mcg total) by mouth daily before breakfast.   loratadine 10 MG tablet Commonly known as: CLARITIN Take 10 mg by mouth daily.   melatonin 3 MG Tabs tablet Take 3 mg by mouth at bedtime.   mirtazapine 30 MG tablet Commonly known as: REMERON Take 60 mg by mouth at bedtime.   polyethylene glycol 17 g packet Commonly known as: MIRALAX / GLYCOLAX Take 17 g by mouth daily. Hold for loose stool   QUEtiapine 25 MG tablet Commonly known as: SEROQUEL Take 12.5 mg by mouth 2 (two) times daily.   Robafen DM Cgh/Chest Congest 10-100 MG/5ML liquid Generic drug: dextromethorphan-guaiFENesin Take 10 mLs by mouth every 6 (six) hours as needed for cough.   sertraline 100 MG tablet Commonly known as: ZOLOFT Take 100 mg by mouth 2 (two) times daily. In the morning. 200mg  in Total.   Systane 0.4-0.3 % Soln Generic drug: Polyethyl Glycol-Propyl Glycol Apply 1 drop to eye 4 (four) times daily.   tamsulosin 0.4 MG Caps capsule Commonly known as: FLOMAX Take 0.4 mg by mouth daily after supper.   topiramate 25 MG tablet Commonly known as: TOPAMAX Take 25 mg by mouth 2 (two) times daily.   Vitamin D3 25 MCG (1000 UT) Caps Take 1,000 Units by mouth daily.   zinc oxide 20 % ointment Apply 1 application topically as needed for irritation.       Review of Systems  Constitutional: Negative.   HENT: Negative.   Respiratory: Negative.   Cardiovascular: Negative.   Gastrointestinal: Negative.     Genitourinary: Negative.   Musculoskeletal: Positive for gait problem.  Skin: Negative.   Neurological: Positive  for weakness.  Psychiatric/Behavioral: Positive for behavioral problems, confusion, dysphoric mood and hallucinations.    Immunization History  Administered Date(s) Administered  . DTaP 11/05/2005  . Hepatitis B Jul 12, 1932  . Influenza Split 11/12/2011, 11/29/2013  . Influenza Whole 10/30/2007, 11/12/2008, 11/26/2009  . Influenza, High Dose Seasonal PF 12/31/2015, 11/24/2016, 11/30/2018  . Influenza,inj,quad, With Preservative 10/16/2016  . Influenza-Unspecified 11/28/2017  . Moderna SARS-COVID-2 Vaccination 02/19/2019, 03/19/2019  . PPD Test 06/23/2011  . Pneumococcal Conjugate-13 01/14/2016  . Pneumococcal Polysaccharide-23 02/16/1999  . Tdap 01/13/2017  . Varicella 11/09/2005  . Zoster 08/30/2006   Pertinent  Health Maintenance Due  Topic Date Due  . INFLUENZA VACCINE  09/16/2019  . PNA vac Low Risk Adult  Completed   Fall Risk  10/28/2017 10/27/2016 06/09/2016 02/03/2016 10/28/2015  Falls in the past year? Yes Yes Yes Yes Yes  Number falls in past yr: 1 2 or more 2 or more 2 or more 2 or more  Comment - - - 2 falls in last 2 months -  Injury with Fall? Yes Yes No Yes No  Comment - L hip - scraps and bruises -  Risk Factor Category  - - - - -  Risk for fall due to : - - - - Impaired balance/gait  Follow up - - - - -   Functional Status Survey:    Vitals:   08/13/19 0947  BP: (!) 186/85  Pulse: (!) 58  Resp: 18  Temp: (!) 97.5 F (36.4 C)  SpO2: 96%  Weight: 132 lb 4.8 oz (60 kg)  Height: 6' (1.829 m)   Body mass index is 17.94 kg/m. Physical Exam Vitals reviewed.  Constitutional:      Comments: Very Frail  HENT:     Head: Normocephalic.     Nose: Nose normal.     Mouth/Throat:     Mouth: Mucous membranes are moist.     Pharynx: Oropharynx is clear.  Eyes:     Pupils: Pupils are equal, round, and reactive to light.  Cardiovascular:      Rate and Rhythm: Normal rate.     Pulses: Normal pulses.  Pulmonary:     Effort: Pulmonary effort is normal.     Breath sounds: Normal breath sounds.  Abdominal:     General: Abdomen is flat. Bowel sounds are normal.     Palpations: Abdomen is soft.  Musculoskeletal:        General: No swelling.     Cervical back: Neck supple.  Skin:    General: Skin is warm.  Neurological:     General: No focal deficit present.     Mental Status: He is alert and oriented to person, place, and time.  Psychiatric:        Mood and Affect: Mood normal.     Labs reviewed: Recent Labs    09/11/18 0000 09/11/18 0000 04/26/19 0320 04/26/19 0534 04/27/19 0504  NA 142  --  143 143 141  K 4.1   < > 3.7 3.4* 4.4  CL 112  --  109 114* 110  CO2 23  --   --  23 22  GLUCOSE  --   --  115* 114* 95  BUN 34*  --  36* 38* 36*  CREATININE 1.3  --  1.10 1.04 1.07  CALCIUM 8.4  --   --  8.2* 8.3*   < > = values in this interval not displayed.   Recent Labs    09/11/18 0000 04/26/19 0534  04/27/19 0504  AST 18 24 22   ALT 12 20 23   ALKPHOS 44 36* 34*  BILITOT  --  0.6 0.6  PROT 5.8 5.4* 5.4*  ALBUMIN 3.5 2.8* 2.5*   Recent Labs    04/26/19 0128 04/26/19 0128 04/26/19 0320 04/26/19 0534 04/27/19 0504  WBC 3.2*  --   --  2.8* 4.3  NEUTROABS 2.3  --   --  1.9 2.6  HGB 15.5   < > 14.3 11.9* 12.2*  HCT 50.0   < > 42.0 38.4* 38.3*  MCV 96.9  --   --  97.5 95.0  PLT 139*  --   --  123* 132*   < > = values in this interval not displayed.   Lab Results  Component Value Date   TSH 0.994 04/26/2019   Lab Results  Component Value Date   HGBA1C 5 01/26/2018   Lab Results  Component Value Date   CHOL 146 11/24/2018   HDL 38 11/24/2018   LDLCALC 89 11/24/2018   LDLDIRECT 132.5 09/08/2012   TRIG 91 11/24/2018   CHOLHDL 2.7 11/15/2016    Significant Diagnostic Results in last 30 days:  MR BRAIN W WO CONTRAST  Result Date: 07/30/2019 CLINICAL DATA:  Hallucination.  Weakness in arms and  legs. EXAM: MRI HEAD WITHOUT AND WITH CONTRAST TECHNIQUE: Multiplanar, multiecho pulse sequences of the brain and surrounding structures were obtained without and with intravenous contrast. CONTRAST:  1mL MULTIHANCE GADOBENATE DIMEGLUMINE 529 MG/ML IV SOLN COMPARISON:  CT head 01/10/2017.  MRI head 09/05/2013 FINDINGS: Brain: Negative for acute infarct Moderate atrophy which has progressed since 2015. Moderate chronic microvascular ischemic changes in the white matter also with progression since 2015. 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 Negative for hemorrhage or mass No enhancing lesions postcontrast administration. Postcontrast imaging degraded by motion. Vascular: Normal arterial flow voids. Skull and upper cervical spine: No focal skeletal lesion. C1-2 arthropathy with pannus formation. No significant stenosis. Disc protrusion and spurring at C2-3 causing spinal stenosis. Sinuses/Orbits: Paranasal sinuses clear. Right cataract extraction. Mild choroidal effusion on the left, not present 2015. Other: None IMPRESSION: No acute intracranial abnormality Moderate atrophy and moderate chronic microvascular ischemia. 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  Left choroidal effusion. Electronically Signed   By: Franchot Gallo M.D.   On: 07/30/2019 15:41    Assessment/Plan Hallucinations, visual MRI negative for any acute findings. Just Moderate Atrophy with Chronic Infarcts D/w the daughter that per Nurses he is doing well on Seroquel Can be doing this to get attention from the family Will hold off increasing the dose of Seroquel Continue to monitor SW is also going to try to get to his Psychologist now that MRI is negative  Major psychotic depression, recurrent ([place]) Continues on Zoloft , Remeron,Wellbutrin and Klonipin Weight loss Weight has been stable since Topamax reduced  Hypertension Patient has some BP very  elevated in Matrix D/w the Nurses they say he gets very anxious and Contracts his Hands. Will Check BP QD for 2 weeks    PAD Had arterial studies done in the facility which showed moderate disease bilateral Not a candidate for any extensive work-up Is asymptomatic at this time  Acquired hypothyroidism TSH normal levels in 3/21  Chronic constipation Doing well on Linzess  Stage 3 chronic kidney disease, unspecified whether stage 3a or 3b CKD Creatinine stable Hyperlipidemia On Lipitor Hand tremors Doing okay on lower dose of Topamax   Family/ staff  Communication:   Labs/tests ordered:

## 2019-08-13 NOTE — Progress Notes (Signed)
This encounter was created in error - please disregard.

## 2019-08-21 ENCOUNTER — Encounter: Payer: Self-pay | Admitting: Nurse Practitioner

## 2019-08-21 ENCOUNTER — Non-Acute Institutional Stay (SKILLED_NURSING_FACILITY): Payer: Medicare Other | Admitting: Nurse Practitioner

## 2019-08-21 ENCOUNTER — Other Ambulatory Visit: Payer: Self-pay

## 2019-08-21 DIAGNOSIS — K5909 Other constipation: Secondary | ICD-10-CM

## 2019-08-21 DIAGNOSIS — G8929 Other chronic pain: Secondary | ICD-10-CM

## 2019-08-21 DIAGNOSIS — F411 Generalized anxiety disorder: Secondary | ICD-10-CM

## 2019-08-21 DIAGNOSIS — M25561 Pain in right knee: Secondary | ICD-10-CM | POA: Diagnosis not present

## 2019-08-21 DIAGNOSIS — N3941 Urge incontinence: Secondary | ICD-10-CM

## 2019-08-21 DIAGNOSIS — G250 Essential tremor: Secondary | ICD-10-CM

## 2019-08-21 DIAGNOSIS — J309 Allergic rhinitis, unspecified: Secondary | ICD-10-CM

## 2019-08-21 DIAGNOSIS — E039 Hypothyroidism, unspecified: Secondary | ICD-10-CM

## 2019-08-21 NOTE — Assessment & Plan Note (Signed)
Stable, continue Linzess, MiraLax.  

## 2019-08-21 NOTE — Assessment & Plan Note (Signed)
Pain is controlled, continue Tylenol.  
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
## 2019-08-21 NOTE — Assessment & Plan Note (Signed)
No urinary retention, continue Tamsulosine.

## 2019-08-21 NOTE — Telephone Encounter (Signed)
Patient is requesting refill on "Clonazepam 0.5mg ". Patient last refill was 06/19/2019. Patient last visit was 08/13/2019. Medication pend and sent to PCP [name], MD. Please Advise.

## 2019-08-21 NOTE — Assessment & Plan Note (Signed)
Mild, continue Topamax.  °
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
## 2019-08-21 NOTE — Assessment & Plan Note (Signed)
Stable, continue Loratadine 10mg  qd.

## 2019-08-21 NOTE — Assessment & Plan Note (Signed)
Stable, continue Levothryoxine, TSH 0.994 04/26/19

## 2019-08-22 MED ORDER — CLONAZEPAM 0.5 MG PO TABS: 0.5000 mg | ORAL_TABLET | Freq: Every day | ORAL | 2 refills | Status: AC

## 2019-08-23 LAB — HEPATIC FUNCTION PANEL
ALT: 12 (ref 10–40)
AST: 15 (ref 14–40)
Alkaline Phosphatase: 43 (ref 25–125)
Bilirubin, Total: 0.5

## 2019-08-23 LAB — BASIC METABOLIC PANEL
BUN: 31 — AB (ref 4–21)
CO2: 26 — AB (ref 13–22)
Chloride: 111 — AB (ref 99–108)
Creatinine: 1 (ref 0.6–1.3)
Glucose: 75
Potassium: 4.1 (ref 3.4–5.3)
Sodium: 142 (ref 137–147)

## 2019-08-23 LAB — CBC AND DIFFERENTIAL
HCT: 40 — AB (ref 41–53)
Hemoglobin: 13 — AB (ref 13.5–17.5)
Neutrophils Absolute: 3894
Platelets: 137 — AB (ref 150–399)
WBC: 5.5

## 2019-08-23 LAB — CBC: RBC: 4.3 (ref 3.87–5.11)

## 2019-08-23 LAB — COMPREHENSIVE METABOLIC PANEL
Albumin: 3.1 — AB (ref 3.5–5.0)
Calcium: 8.5 — AB (ref 8.7–10.7)
Globulin: 2.1

## 2019-09-11 ENCOUNTER — Non-Acute Institutional Stay (SKILLED_NURSING_FACILITY): Payer: Medicare Other | Admitting: Nurse Practitioner

## 2019-09-11 ENCOUNTER — Encounter: Payer: Self-pay | Admitting: Nurse Practitioner

## 2019-09-11 DIAGNOSIS — J309 Allergic rhinitis, unspecified: Secondary | ICD-10-CM | POA: Diagnosis not present

## 2019-09-11 DIAGNOSIS — K5909 Other constipation: Secondary | ICD-10-CM | POA: Diagnosis not present

## 2019-09-11 DIAGNOSIS — M159 Polyosteoarthritis, unspecified: Secondary | ICD-10-CM

## 2019-09-11 DIAGNOSIS — E039 Hypothyroidism, unspecified: Secondary | ICD-10-CM | POA: Diagnosis not present

## 2019-09-11 DIAGNOSIS — N401 Enlarged prostate with lower urinary tract symptoms: Secondary | ICD-10-CM

## 2019-09-11 DIAGNOSIS — F333 Major depressive disorder, recurrent, severe with psychotic symptoms: Secondary | ICD-10-CM

## 2019-09-11 DIAGNOSIS — N138 Other obstructive and reflux uropathy: Secondary | ICD-10-CM

## 2019-09-11 DIAGNOSIS — G250 Essential tremor: Secondary | ICD-10-CM

## 2019-09-11 NOTE — Progress Notes (Signed)
Location:    [place] Room Number: 37 Place of Service:  SNF (31) Provider:  Marda Stalker, Lennie Odor NP   [name], MD  Patient Care Team: [name], MD as PCP - General (Internal Medicine) [name], MD as Consulting Physician (Urology) [name], MD as Consulting Physician (Neurology) [name], MD as Consulting Physician (Dermatology) [name], MD (Inactive) as Consulting Physician (Gastroenterology) [name], MD as Consulting Physician (Orthopedic Surgery) [name], MD as Consulting Physician (Cardiology) [name], MD as Consulting Physician (Ophthalmology) [name], MD as Referring Physician (Psychiatry) [name], MD as Consulting Physician (General Surgery) [name], MD as Consulting Physician (Orthopedic Surgery) Crooks, [name], NP as Nurse Practitioner (Family Medicine) Tkai Serfass X, NP as Nurse Practitioner (Internal Medicine)  Extended Emergency Contact Information Primary Emergency Contact: Daudelin,[name] Address: [address]          [place], [place] [address] [place] Phone: [phone] Relation: Spouse Secondary Emergency Contact: Durwin Glaze Mobile Phone: [phone] Relation: Daughter  Code Status:  DNR Goals of care: Advanced Directive information Advanced Directives 06/14/2019  Does Patient Have a Medical Advance Directive? Yes  Type of Advance Directive Out of facility DNR (pink MOST or yellow form)  Does patient want to make changes to medical advance directive? No - Patient declined  Copy of [place] in Chart? Yes - validated most recent copy scanned in chart (See row information)  Pre-existing out of facility DNR order (yellow form or pink MOST form) Pink MOST form placed in chart (order not valid for inpatient use)     Chief Complaint  Patient presents with  . Medical Management of Chronic  Issues    HPI:  Pt is a 86 y.o. male seen today for medical management of chronic diseases.    Psychotic featured depression/anxiety/delusion/hallucination, takes Sertralien 200mg  qd, Mirtazapine 60mg  qd, Clonazepam 0.5mg  qhs, Bupropion 300mg  qd, Abilify off, Quetiapine 25mg  qam,  50mg  qd, MRI brain unremarkable.  Hx of OA, stable, on Tylenol 1000mg  tid.              Hypothyroidism, stable, on Levothyroxine 60mcg qd 6x/wk, 38mcg qd 1x/wk.              Constipation, stable, on Linzess qd, MiraLax qd.              BPH, stable, on Tamsulosin.    Essential tremor, on Topamax BID.              Rhinitis, takes Loratadine 10mg  qd.    Past Medical History:  Diagnosis Date  . Acute urinary retention 01/28/2013  . AKI (acute kidney injury) ([place]) 01/26/2013  . Altered mental status 09/09/2013  . Anemia    B12 deficiency  . ANXIETY 06/07/2007   Qualifier: Diagnosis of  By: Talbert Cage CMA ([place]), June    . ARTHRITIS 06/07/2007   Qualifier: Diagnosis of  By: Talbert Cage CMA ([place]), June    . Balance problems 04/05/2011  . Benign essential tremor   . Benign fibroma of prostate 10/11/2011  . Bilateral inguinal hernia 10/09/2012  . Bilateral leg edema 09/13/2012  . Bladder retention 02/05/2013  . Bradycardia, sinus 08/02/2012  . CAROTID ARTERY DISEASE 03/31/2007   Qualifier: Diagnosis of  By: Linna Darner MD, Gwyndolyn Saxon    . Carotid stenosis    s/p L CEA  . Cataract, nuclear 03/21/2014  .  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 Cervical spinal stenosis   . Cervical stenosis of spine   . Critical lower limb ischemia   . Degeneration macular 11/02/2011  . Depression    Dr Albertine Patricia  . Difficulty in walking(719.7) 04/05/2011  . ESOPHAGITIS 08/06/2002   Qualifier: Diagnosis of  By: Talbert Cage CMA ([place]), June    . Essential and other specified forms of tremor 04/18/2013  . Fall at home 01/06/2017   left hip pain  . Falls   . Family history of colon cancer 07/24/2012  . Fecal impaction ([place]) 10/11/2012  . Femoral  hernia, bilateral s/p lap repair 01/16/2013 01/16/2013  . Glaucoma, compensated 03/21/2014  . H/O cardiovascular stress test    a. 02/2003 -> no ischemia/infarct.  Marland Kitchen HAMMER TOE 04/23/2010   Qualifier: Diagnosis of  By: Linna Darner MD, Gwyndolyn Saxon    . Hematoma of leg   . Hip hematoma, right 09/09/2013  . History of shingles 2009  . HTN (hypertension) 08/22/2012  . Hx of echocardiogram    Echocardiogram 08/01/12: Mild LVH, EF 55-60%, normal wall motion.  . Hydrocele 10/11/2011  . Hyperlipidemia   . 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 Hypothyroidism   . Macular degeneration disease   . Macular edema   . Migraines    Dr Jannifer Franklin  . MORTON'S NEUROMA, LEFT 08/22/2007   Qualifier: Diagnosis of  By: Linna Darner MD, Gwyndolyn Saxon    . PAD (peripheral artery disease) ([place])   . Peripheral neuropathy   . Personal history of colonic polyps 01/05/2013   2014 2 mm adenomatous polyp   . PREMATURE ATRIAL CONTRACTIONS 03/14/2006   Qualifier: Diagnosis of  By: Linna Darner MD, Gwendolyn Lima 12/16/2011  . SPINAL STENOSIS 03/14/2006   Qualifier: Diagnosis of  By: Linna Darner MD, William   Cervical spine    . Testosterone deficiency    Dr Lawerance Bach, [facility]  . Unspecified vitamin D deficiency 08/06/2012  . Urge incontinence 10/09/2012  . Urinary tract infection, site not specified 02/13/2013  . Weakness 08/02/2012   Past Surgical History:  Procedure Laterality Date  . APPENDECTOMY  1941  . BREAST CYST EXCISION Left 01/16/2013   Procedure: MASS EXCISION AXILLA;  Surgeon: Adin Hector, MD;  Location: [facility];  Service: General;  Laterality: Left;  . CAROTID ENDARTERECTOMY  2005    L  . CATARACT EXTRACTION Right   . COLONOSCOPY  2014   Dr. Deatra Ina  . EYE SURGERY Right 02/2012   retina  . INGUINAL HERNIA REPAIR Bilateral 01/16/2013   Procedure: LAPAROSCOPIC BILATERAL INGUINAL DIRECT AND INDIRECT, FEMORAL, AND OBTURATOR HERNIAS;  Surgeon: Adin Hector, MD;  Location: [facility];  Service: General;  Laterality: Bilateral;  . INSERTION  OF MESH N/A 01/16/2013   Procedure: INSERTION OF MESH;  Surgeon: Adin Hector, MD;  Location: [facility];  Service: General;  Laterality: N/A;  . LUMBAR LAMINECTOMY    . TONSILLECTOMY AND ADENOIDECTOMY    . TOTAL HIP ARTHROPLASTY  2005   L    Allergies  Allergen Reactions  . Latex Rash  . Tape Rash    Paper tape is ok to use     Allergies as of 09/11/2019      Reactions   Latex Rash   Tape Rash   Paper tape is ok to use      Medication List       Accurate as of September 11, 2019 11:59 PM. If you have any questions, ask your nurse or doctor.  acetaminophen 500 MG tablet Commonly known as: TYLENOL Take 1,000 mg by mouth 3 (three) times daily. And q6hprn for pain/fever   albuterol (2.5 MG/3ML) 0.083% nebulizer solution Commonly known as: PROVENTIL Take 2.5 mg by nebulization every 6 (six) hours as needed for wheezing or shortness of breath.   aspirin 81 MG tablet Take 81 mg by mouth every morning.   atorvastatin 20 MG tablet Commonly known as: LIPITOR Take 20 mg by mouth daily.   B-complex with vitamin C tablet Take 1 tablet by mouth daily.   bimatoprost 0.01 % Soln Commonly known as: LUMIGAN Place 1 drop into both eyes at bedtime.   buPROPion 300 MG 24 hr tablet Commonly known as: WELLBUTRIN XL Take 300 mg by mouth daily. per psychiatry recommendation   clonazePAM 0.5 MG tablet Commonly known as: KLONOPIN Take 1 tablet (0.5 mg total) by mouth at bedtime.   erythromycin ophthalmic ointment Place 1 application into both eyes at bedtime.   feeding supplement (PRO-STAT SUGAR FREE 64) Liqd Take 30 mLs by mouth in the morning.   ICAPS PO Take 1 tablet by mouth daily.   ketoconazole 2 % cream Commonly known as: NIZORAL Apply 1 application topically 2 (two) times daily as needed for irritation. Apply to buttocks.   lactose free nutrition Liqd Take 237 mLs by mouth 2 (two) times daily between meals. What changed: Another medication with the same name was  removed. Continue taking this medication, and follow the directions you see here. Changed by: Shameika Speelman X Shaterra Sanzone, NP   levothyroxine 50 MCG tablet Commonly known as: SYNTHROID Take 25 mcg by mouth. On Sundays   levothyroxine 50 MCG tablet Commonly known as: SYNTHROID Take 50 mcg by mouth See admin instructions. Take daily except for Sundays 1/2 Tablet = 33mcg.   linaclotide 145 MCG Caps capsule Commonly known as: LINZESS Take 1 capsule (145 mcg total) by mouth daily before breakfast.   loratadine 10 MG tablet Commonly known as: CLARITIN Take 10 mg by mouth daily.   melatonin 3 MG Tabs tablet Take 3 mg by mouth at bedtime.   mirtazapine 30 MG tablet Commonly known as: REMERON Take 60 mg by mouth at bedtime.   polyethylene glycol 17 g packet Commonly known as: MIRALAX / GLYCOLAX Take 17 g by mouth daily. Hold for loose stool   QUEtiapine 25 MG tablet Commonly known as: SEROQUEL Take 25 mg by mouth every morning.   QUEtiapine 50 MG tablet Commonly known as: SEROQUEL Take 50 mg by mouth daily.   Robafen DM Cgh/Chest Congest 10-100 MG/5ML liquid Generic drug: dextromethorphan-guaiFENesin Take 10 mLs by mouth every 6 (six) hours as needed for cough.   sertraline 100 MG tablet Commonly known as: ZOLOFT Take 200 mg by mouth every morning. In the morning. 200mg  in Total.   Systane 0.4-0.3 % Soln Generic drug: Polyethyl Glycol-Propyl Glycol Apply 1 drop to eye 4 (four) times daily.   tamsulosin 0.4 MG Caps capsule Commonly known as: FLOMAX Take 0.4 mg by mouth daily after supper.   topiramate 25 MG tablet Commonly known as: TOPAMAX Take 25 mg by mouth 2 (two) times daily.   Vitamin D3 25 MCG (1000 UT) Caps Take 1,000 Units by mouth daily.   zinc oxide 20 % ointment Apply 1 application topically as needed for irritation.       Review of Systems  Constitutional: Negative for activity change, fever and unexpected weight change.  HENT: Positive for hearing loss.  Negative for congestion and voice change.  Eyes: Positive for visual disturbance.  Respiratory: Negative for cough and shortness of breath.   Cardiovascular: Negative for leg swelling.  Gastrointestinal: Negative for abdominal pain and constipation.  Genitourinary: Negative for difficulty urinating and dysuria.       Incontinent of urine.   Musculoskeletal: Positive for arthralgias and gait problem.  Skin: Negative for pallor.  Neurological: Positive for tremors. Negative for dizziness, facial asymmetry, speech difficulty and weakness.       Memory lapses. Fine tremor in fingers.   Psychiatric/Behavioral: Positive for hallucinations. Negative for behavioral problems, dysphoric mood and sleep disturbance. The patient is nervous/anxious.        Delusions, anxiety, hallucination improved    Immunization History  Administered Date(s) Administered  . DTaP 11/05/2005  . Hepatitis B Jan 23, 1933  . Influenza Split 11/12/2011, 11/29/2013  . Influenza Whole 10/30/2007, 11/12/2008, 11/26/2009  . Influenza, High Dose Seasonal PF 12/31/2015, 11/24/2016, 11/30/2018  . Influenza,inj,quad, With Preservative 10/16/2016  . Influenza-Unspecified 11/28/2017  . Moderna SARS-COVID-2 Vaccination 02/19/2019, 03/19/2019  . PPD Test 06/23/2011  . Pneumococcal Conjugate-13 01/14/2016  . Pneumococcal Polysaccharide-23 02/16/1999  . Tdap 01/13/2017  . Varicella 11/09/2005  . Zoster 08/30/2006   Pertinent  Health Maintenance Due  Topic Date Due  . INFLUENZA VACCINE  09/16/2019  . PNA vac Low Risk Adult  Completed   Fall Risk  10/28/2017 10/27/2016 06/09/2016 02/03/2016 10/28/2015  Falls in the past year? Yes Yes Yes Yes Yes  Number falls in past yr: 1 2 or more 2 or more 2 or more 2 or more  Comment - - - 2 falls in last 2 months -  Injury with Fall? Yes Yes No Yes No  Comment - L hip - scraps and bruises -  Risk Factor Category  - - - - -  Risk for fall due to : - - - - Impaired balance/gait  Follow up -  - - - -   Functional Status Survey:    Vitals:   09/11/19 1624  BP: (!) 145/76  Pulse: 74  Resp: 20  Temp: 97.6 F (36.4 C)  SpO2: 95%  Weight: 137 lb 12.8 oz (62.5 kg)  Height: 6' (1.829 m)   Body mass index is 18.69 kg/m. Physical Exam Vitals and nursing note reviewed.  Constitutional:      Appearance: Normal appearance. He is normal weight.  HENT:     Head: Normocephalic and atraumatic.  Eyes:     Extraocular Movements: Extraocular movements intact.     Conjunctiva/sclera: Conjunctivae normal.     Pupils: Pupils are equal, round, and reactive to light.     Comments: Left eye blind.   Cardiovascular:     Rate and Rhythm: Normal rate and regular rhythm.     Heart sounds: No murmur heard.   Pulmonary:     Effort: Pulmonary effort is normal.     Breath sounds: No rales.  Abdominal:     General: Bowel sounds are normal.     Palpations: Abdomen is soft.     Tenderness: There is no abdominal tenderness.  Musculoskeletal:     Cervical back: Normal range of motion and neck supple.     Right lower leg: No edema.     Left lower leg: No edema.  Skin:    General: Skin is warm and dry.  Neurological:     General: No focal deficit present.     Mental Status: He is alert. Mental status is at baseline.     Gait:  Gait abnormal.     Comments: Oriented to person, place. Fine, mild resting tremor in fingers, not disabling.   Psychiatric:     Comments: Less hallucination, delusion     Labs reviewed: Recent Labs    04/26/19 0320 04/26/19 0320 04/26/19 0534 04/27/19 0504 08/23/19 0000  NA 143   < > 143 141 142  K 3.7   < > 3.4* 4.4 4.1  CL 109   < > 114* 110 111*  CO2  --   --  23 22 26*  GLUCOSE 115*  --  114* 95  --   BUN 36*   < > 38* 36* 31*  CREATININE 1.10   < > 1.04 1.07 1.0  CALCIUM  --   --  8.2* 8.3* 8.5*   < > = values in this interval not displayed.   Recent Labs    04/26/19 0534 04/27/19 0504 08/23/19 0000  AST 24 22 15   ALT 20 23 12   ALKPHOS  36* 34* 43  BILITOT 0.6 0.6  --   PROT 5.4* 5.4*  --   ALBUMIN 2.8* 2.5* 3.1*   Recent Labs    04/26/19 0128 04/26/19 0320 04/26/19 0534 04/27/19 0504 08/23/19 0000  WBC 3.2*   < > 2.8* 4.3 5.5  NEUTROABS 2.3   < > 1.9 2.6 3,894  HGB 15.5   < > 11.9* 12.2* 13.0*  HCT 50.0   < > 38.4* 38.3* 40*  MCV 96.9  --  97.5 95.0  --   PLT 139*   < > 123* 132* 137*   < > = values in this interval not displayed.   Lab Results  Component Value Date   TSH 0.994 04/26/2019   Lab Results  Component Value Date   HGBA1C 5 01/26/2018   Lab Results  Component Value Date   CHOL 146 11/24/2018   HDL 38 11/24/2018   LDLCALC 89 11/24/2018   LDLDIRECT 132.5 09/08/2012   TRIG 91 11/24/2018   CHOLHDL 2.7 11/15/2016    Significant Diagnostic Results in last 30 days:  No results found.  Assessment/Plan Allergic rhinitis Stable, continue Loratadine.   Chronic constipation Stable, continue Linzess, MiraLax.   Hypothyroidism Stable, TSH 0.99 04/26/19, continue Levothyroxine.   Benign essential tremor Mild, not disabling, continue Topamax.   Benign prostatic hyperplasia with urinary obstruction No urinary retention, continue Tamsulosin.   Major psychotic depression, recurrent (HCC) Improved in hallucination/delusion, continue Sertralien 200mg  qd, Mirtazapine 60mg  qd, Clonazepam 0.5mg  qhs, Bupropion 300mg  qd, Abilify off, Quetiapine 25mg  qam,  50mg  qd, MRI brain unremarkable.    Generalized osteoarthritis of multiple sites Pain is controlled, continue Tylenol.      Family/ staff Communication: plan of care reviewed with the patient and charge nurse.   Labs/tests ordered:  none  Time spend 35 minutes.

## 2019-09-15 ENCOUNTER — Encounter: Payer: Self-pay | Admitting: Nurse Practitioner

## 2019-09-16 ENCOUNTER — Encounter: Payer: Self-pay | Admitting: Nurse Practitioner

## 2019-09-16 NOTE — Assessment & Plan Note (Signed)
Stable, TSH 0.99 04/26/19, continue Levothyroxine.

## 2019-09-16 NOTE — Assessment & Plan Note (Signed)
Pain is controlled, continue Tylenol.  

## 2019-09-16 NOTE — Assessment & Plan Note (Signed)
Stable, continue Loratadine.

## 2019-09-16 NOTE — Assessment & Plan Note (Signed)
Improved in hallucination/delusion, continue Sertralien 200mg  qd, Mirtazapine 60mg  qd, Clonazepam 0.5mg  qhs, Bupropion 300mg  qd, Abilify off, Quetiapine 25mg  qam,  50mg  qd, MRI brain unremarkable.

## 2019-09-16 NOTE — Assessment & Plan Note (Signed)
No urinary retention, continue Tamsulosin 

## 2019-09-16 NOTE — Assessment & Plan Note (Signed)
Mild, not disabling, continue Topamax.

## 2019-09-16 NOTE — Assessment & Plan Note (Signed)
Stable, continue Linzess, MiraLax.  

## 2019-09-24 ENCOUNTER — Non-Acute Institutional Stay (SKILLED_NURSING_FACILITY): Payer: Medicare Other | Admitting: Nurse Practitioner

## 2019-09-24 ENCOUNTER — Encounter: Payer: Self-pay | Admitting: Nurse Practitioner

## 2019-09-24 DIAGNOSIS — E039 Hypothyroidism, unspecified: Secondary | ICD-10-CM

## 2019-09-24 DIAGNOSIS — N401 Enlarged prostate with lower urinary tract symptoms: Secondary | ICD-10-CM

## 2019-09-24 DIAGNOSIS — R296 Repeated falls: Secondary | ICD-10-CM

## 2019-09-24 DIAGNOSIS — F333 Major depressive disorder, recurrent, severe with psychotic symptoms: Secondary | ICD-10-CM

## 2019-09-24 DIAGNOSIS — M159 Polyosteoarthritis, unspecified: Secondary | ICD-10-CM | POA: Diagnosis not present

## 2019-09-24 DIAGNOSIS — J309 Allergic rhinitis, unspecified: Secondary | ICD-10-CM

## 2019-09-24 DIAGNOSIS — R2681 Unsteadiness on feet: Secondary | ICD-10-CM

## 2019-09-24 DIAGNOSIS — K5909 Other constipation: Secondary | ICD-10-CM

## 2019-09-24 DIAGNOSIS — G250 Essential tremor: Secondary | ICD-10-CM

## 2019-09-24 DIAGNOSIS — N138 Other obstructive and reflux uropathy: Secondary | ICD-10-CM

## 2019-09-24 NOTE — Assessment & Plan Note (Signed)
No urinary retention, continue Tamsulosin 

## 2019-09-24 NOTE — Assessment & Plan Note (Signed)
Uses w/c for mobility. Needs supervision/assistance for transfer.

## 2019-09-24 NOTE — Assessment & Plan Note (Signed)
Stable, continue Linzess qd, MiraLax qd.

## 2019-09-24 NOTE — Assessment & Plan Note (Signed)
Stable, continue daily Loratadine.

## 2019-09-24 NOTE — Assessment & Plan Note (Signed)
Stable, pain is controlled, continue Tylenol.

## 2019-09-24 NOTE — Assessment & Plan Note (Signed)
Stable, continue Levothyroxine, TSH 0.99 04/26/19

## 2019-09-24 NOTE — Assessment & Plan Note (Signed)
reported 09/23/19 the patient unwitnessed fall in his bathroom when the patient was found lying on his right side. The patient stated he slid down onto the floor and pulled the call light string using his feet, no apparent injury noted.  Close supervision and assistance for safety, the patient's lack of safety awareness and unsteady gait are contributory.

## 2019-09-24 NOTE — Assessment & Plan Note (Signed)
Stable, continue Topamax.  

## 2019-09-24 NOTE — Progress Notes (Signed)
Location:    [facility] Room Number: 37 Place of Service:  SNF (31) Provider:  Marda Stalker, Lennie Odor NP  [name], MD  Patient Care Team: [name], MD as PCP - General (Internal Medicine) [name], MD as Consulting Physician (Urology) [name], MD as Consulting Physician (Neurology) [name], MD as Consulting Physician (Dermatology) [name], MD (Inactive) as Consulting Physician (Gastroenterology) [name], MD as Consulting Physician (Orthopedic Surgery) [name], MD as Consulting Physician (Cardiology) [name], MD as Consulting Physician (Ophthalmology) [name], MD as Referring Physician (Psychiatry) [name], MD as Consulting Physician (General Surgery) [name], MD as Consulting Physician (Orthopedic Surgery) [place], [name], NP as Nurse Practitioner (Family Medicine) Janathan Bribiesca X, NP as Nurse Practitioner (Internal Medicine)  Extended Emergency Contact Information Primary Emergency Contact: Marro,[name] Address: [address]          [place], [place] [address] [place] Phone: [phone] Relation: Spouse Secondary Emergency Contact: Durwin Glaze Mobile Phone: [phone] Relation: Daughter  Code Status:  DNR Goals of care: Advanced Directive information Advanced Directives 06/14/2019  Does Patient Have a Medical Advance Directive? Yes  Type of Advance Directive Out of facility DNR (pink MOST or yellow form)  Does patient want to make changes to medical advance directive? No - Patient declined  Copy of [place] in Chart? Yes - validated most recent copy scanned in chart (See row information)  Pre-existing out of facility DNR order (yellow form or pink MOST form) Pink MOST form placed in chart (order not valid for inpatient use)     Chief Complaint  Patient presents with  . Acute Visit    Fall     HPI:    Pt is a 86 y.o. male seen today for an acute visit for reported 09/23/19 the patient unwitnessed fall in his bathroom when the patient was found lying on his right side. The patient stated he slid down onto the floor and pulled the call light string using his feet, no apparent injury noted.   Psychotic featured depression/anxiety/delusion/hallucination, takes Sertralien200mg  qd, Mirtazapine 60mg  qd, Clonazepam 0.5mg  qhs, Bupropion 300mg  qd, Abilify off, Quetiapine 25mg  qam,  50mg  qd, MRI brain unremarkable.  Hx of OA, stable, on Tylenol 1000mg  tid. Hypothyroidism, stable, on Levothyroxine 79mcg qd 6x/wk, 7mcg qd 1x/wk.  Constipation, stable, on Linzess qd, MiraLax qd.  BPH, stable, on Tamsulosin.              Essential tremor, on Topamax BID.  Rhinitis, takes Loratadine 10mg  qd.     Past Medical History:  Diagnosis Date  . Acute urinary retention 01/28/2013  . AKI (acute kidney injury) ([facility]) 01/26/2013  . Altered mental status 09/09/2013  . Anemia    B12 deficiency  . ANXIETY 06/07/2007   Qualifier: Diagnosis of  By: Talbert Cage CMA ([place]), June    . ARTHRITIS 06/07/2007   Qualifier: Diagnosis of  By: Talbert Cage CMA ([place]), June    . Balance problems 04/05/2011  . Benign essential tremor   . Benign fibroma of prostate 10/11/2011  . Bilateral inguinal hernia 10/09/2012  . Bilateral leg edema 09/13/2012  . Bladder retention 02/05/2013  . Bradycardia, sinus 08/02/2012  . CAROTID ARTERY DISEASE 03/31/2007   Qualifier: Diagnosis of  By: Linna Darner MD, Gwyndolyn Saxon    . Carotid stenosis    s/p L CEA  . Cataract,  nuclear 03/21/2014  . 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 Cervical spinal stenosis   . Cervical stenosis of spine   . Critical lower limb ischemia   . Degeneration macular 11/02/2011  . Depression    Dr Albertine Patricia  . Difficulty in walking(719.7) 04/05/2011  . ESOPHAGITIS 08/06/2002   Qualifier: Diagnosis of  By: Talbert Cage CMA ([place]), June     . Essential and other specified forms of tremor 04/18/2013  . Fall at home 01/06/2017   left hip pain  . Falls   . Family history of colon cancer 07/24/2012  . Fecal impaction ([facility]) 10/11/2012  . Femoral hernia, bilateral s/p lap repair 01/16/2013 01/16/2013  . Glaucoma, compensated 03/21/2014  . H/O cardiovascular stress test    a. 02/2003 -> no ischemia/infarct.  Marland Kitchen HAMMER TOE 04/23/2010   Qualifier: Diagnosis of  By: Linna Darner MD, Gwyndolyn Saxon    . Hematoma of leg   . Hip hematoma, right 09/09/2013  . History of shingles 2009  . HTN (hypertension) 08/22/2012  . Hx of echocardiogram    Echocardiogram 08/01/12: Mild LVH, EF 55-60%, normal wall motion.  . Hydrocele 10/11/2011  . Hyperlipidemia   . 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 Hypothyroidism   . Macular degeneration disease   . Macular edema   . Migraines    Dr Jannifer Franklin  . MORTON'S NEUROMA, LEFT 08/22/2007   Qualifier: Diagnosis of  By: Linna Darner MD, Gwyndolyn Saxon    . PAD (peripheral artery disease) ([facility])   . Peripheral neuropathy   . Personal history of colonic polyps 01/05/2013   2014 2 mm adenomatous polyp   . PREMATURE ATRIAL CONTRACTIONS 03/14/2006   Qualifier: Diagnosis of  By: Linna Darner MD, Gwendolyn Lima 12/16/2011  . SPINAL STENOSIS 03/14/2006   Qualifier: Diagnosis of  By: Linna Darner MD, William   Cervical spine    . Testosterone deficiency    Dr Lawerance Bach, [facility]  . Unspecified vitamin D deficiency 08/06/2012  . Urge incontinence 10/09/2012  . Urinary tract infection, site not specified 02/13/2013  . Weakness 08/02/2012   Past Surgical History:  Procedure Laterality Date  . APPENDECTOMY  1941  . BREAST CYST EXCISION Left 01/16/2013   Procedure: MASS EXCISION AXILLA;  Surgeon: Adin Hector, MD;  Location: [facility];  Service: General;  Laterality: Left;  . CAROTID ENDARTERECTOMY  2005    L  . CATARACT EXTRACTION Right   . COLONOSCOPY  2014   Dr. Deatra Ina  . EYE SURGERY Right 02/2012   retina  . INGUINAL HERNIA REPAIR Bilateral  01/16/2013   Procedure: LAPAROSCOPIC BILATERAL INGUINAL DIRECT AND INDIRECT, FEMORAL, AND OBTURATOR HERNIAS;  Surgeon: Adin Hector, MD;  Location: [facility];  Service: General;  Laterality: Bilateral;  . INSERTION OF MESH N/A 01/16/2013   Procedure: INSERTION OF MESH;  Surgeon: Adin Hector, MD;  Location: [facility];  Service: General;  Laterality: N/A;  . LUMBAR LAMINECTOMY    . TONSILLECTOMY AND ADENOIDECTOMY    . TOTAL HIP ARTHROPLASTY  2005   L    Allergies  Allergen Reactions  . Latex Rash  . Tape Rash    Paper tape is ok to use     Allergies as of 09/24/2019      Reactions   Latex Rash   Tape Rash   Paper tape is ok to use      Medication List       Accurate as of September 24, 2019 11:59 PM. If you have any questions, ask your nurse  or doctor.        STOP taking these medications   albuterol (2.5 MG/3ML) 0.083% nebulizer solution Commonly known as: PROVENTIL Stopped by: Jt Brabec X Althea Backs, NP   feeding supplement (PRO-STAT SUGAR FREE 64) Liqd Stopped by: Leen Tworek X Xaniyah Buchholz, NP     TAKE these medications   acetaminophen 500 MG tablet Commonly known as: TYLENOL Take 1,000 mg by mouth 3 (three) times daily. And q6hprn for pain/fever   aspirin 81 MG tablet Take 81 mg by mouth every morning.   atorvastatin 20 MG tablet Commonly known as: LIPITOR Take 20 mg by mouth daily.   B-complex with vitamin C tablet Take 1 tablet by mouth daily.   bimatoprost 0.01 % Soln Commonly known as: LUMIGAN Place 1 drop into both eyes at bedtime.   buPROPion 300 MG 24 hr tablet Commonly known as: WELLBUTRIN XL Take 300 mg by mouth daily. per psychiatry recommendation   clonazePAM 0.5 MG tablet Commonly known as: KLONOPIN Take 1 tablet (0.5 mg total) by mouth at bedtime.   erythromycin ophthalmic ointment Place 1 application into both eyes at bedtime.   ICAPS PO Take 1 tablet by mouth daily.   ketoconazole 2 % cream Commonly known as: NIZORAL Apply 1 application topically 2 (two) times  daily as needed for irritation. Apply to buttocks.   lactose free nutrition Liqd Take 237 mLs by mouth 2 (two) times daily between meals.   levothyroxine 50 MCG tablet Commonly known as: SYNTHROID Take 25 mcg by mouth. On Sundays   levothyroxine 50 MCG tablet Commonly known as: SYNTHROID Take 50 mcg by mouth See admin instructions. Take daily except for Sundays 1/2 Tablet = 70mcg.   linaclotide 145 MCG Caps capsule Commonly known as: LINZESS Take 1 capsule (145 mcg total) by mouth daily before breakfast.   loratadine 10 MG tablet Commonly known as: CLARITIN Take 10 mg by mouth daily.   melatonin 3 MG Tabs tablet Take 3 mg by mouth at bedtime.   mirtazapine 30 MG tablet Commonly known as: REMERON Take 60 mg by mouth at bedtime.   polyethylene glycol 17 g packet Commonly known as: MIRALAX / GLYCOLAX Take 17 g by mouth daily. Hold for loose stool   QUEtiapine 25 MG tablet Commonly known as: SEROQUEL Take 25 mg by mouth every morning.   QUEtiapine 50 MG tablet Commonly known as: SEROQUEL Take 50 mg by mouth daily.   Robafen DM Cgh/Chest Congest 10-100 MG/5ML liquid Generic drug: dextromethorphan-guaiFENesin Take 10 mLs by mouth every 6 (six) hours as needed for cough.   sertraline 100 MG tablet Commonly known as: ZOLOFT Take 200 mg by mouth every morning. In the morning. 200mg  in Total.   Systane 0.4-0.3 % Soln Generic drug: Polyethyl Glycol-Propyl Glycol Apply 1 drop to eye 4 (four) times daily.   tamsulosin 0.4 MG Caps capsule Commonly known as: FLOMAX Take 0.4 mg by mouth daily after supper.   topiramate 25 MG tablet Commonly known as: TOPAMAX Take 25 mg by mouth 2 (two) times daily.   Vitamin D3 25 MCG (1000 UT) Caps Take 1,000 Units by mouth daily.   zinc oxide 20 % ointment Apply 1 application topically as needed for irritation.       Review of Systems  Constitutional: Negative for appetite change, fatigue and fever.  HENT: Positive for  hearing loss. Negative for congestion and voice change.   Eyes: Positive for visual disturbance.  Respiratory: Negative for cough and shortness of breath.   Cardiovascular: Negative  for leg swelling.  Gastrointestinal: Negative for abdominal pain and constipation.  Genitourinary: Negative for difficulty urinating and dysuria.       Incontinent of urine.   Musculoskeletal: Positive for arthralgias and gait problem.  Skin: Negative for color change.  Neurological: Positive for tremors. Negative for speech difficulty, weakness, light-headedness and headaches.       Memory lapses. Fine tremor in fingers.   Psychiatric/Behavioral: Positive for hallucinations. Negative for behavioral problems, dysphoric mood and sleep disturbance. The patient is nervous/anxious.        Delusions, anxiety, hallucination improved    Immunization History  Administered Date(s) Administered  . DTaP 11/05/2005  . Hepatitis B 1932/11/25  . Influenza Split 11/12/2011, 11/29/2013  . Influenza Whole 10/30/2007, 11/12/2008, 11/26/2009  . Influenza, High Dose Seasonal PF 12/31/2015, 11/24/2016, 11/30/2018  . Influenza,inj,quad, With Preservative 10/16/2016  . Influenza-Unspecified 11/28/2017  . Moderna SARS-COVID-2 Vaccination 02/19/2019, 03/19/2019  . PPD Test 06/23/2011  . Pneumococcal Conjugate-13 01/14/2016  . Pneumococcal Polysaccharide-23 02/16/1999  . Tdap 01/13/2017  . Varicella 11/09/2005  . Zoster 08/30/2006   Pertinent  Health Maintenance Due  Topic Date Due  . INFLUENZA VACCINE  09/16/2019  . PNA vac Low Risk Adult  Completed   Fall Risk  10/28/2017 10/27/2016 06/09/2016 02/03/2016 10/28/2015  Falls in the past year? Yes Yes Yes Yes Yes  Number falls in past yr: 1 2 or more 2 or more 2 or more 2 or more  Comment - - - 2 falls in last 2 months -  Injury with Fall? Yes Yes No Yes No  Comment - L hip - scraps and bruises -  Risk Factor Category  - - - - -  Risk for fall due to : - - - - Impaired  balance/gait  Follow up - - - - -   Functional Status Survey:    Vitals:   09/24/19 1615  BP: 110/77  Pulse: 79  Resp: (!) 21  Temp: 97.6 F (36.4 C)  SpO2: 97%  Weight: 134 lb 9.6 oz (61.1 kg)  Height: 6' (1.829 m)   Body mass index is 18.26 kg/m. Physical Exam Vitals and nursing note reviewed.  Constitutional:      Appearance: Normal appearance.  HENT:     Head: Normocephalic and atraumatic.     Mouth/Throat:     Mouth: Mucous membranes are moist.  Eyes:     Extraocular Movements: Extraocular movements intact.     Conjunctiva/sclera: Conjunctivae normal.     Pupils: Pupils are equal, round, and reactive to light.     Comments: Left eye blind.   Cardiovascular:     Rate and Rhythm: Normal rate and regular rhythm.     Heart sounds: No murmur heard.   Pulmonary:     Effort: Pulmonary effort is normal.     Breath sounds: No rales.  Abdominal:     General: Bowel sounds are normal.     Palpations: Abdomen is soft.     Tenderness: There is no abdominal tenderness.  Musculoskeletal:     Cervical back: Normal range of motion and neck supple.     Right lower leg: No edema.     Left lower leg: No edema.  Skin:    General: Skin is warm and dry.  Neurological:     General: No focal deficit present.     Mental Status: He is alert. Mental status is at baseline.     Gait: Gait abnormal.     Comments: Oriented  to person, place. Fine, mild resting tremor in fingers, not disabling.   Psychiatric:     Comments: Less hallucination, delusion     Labs reviewed: Recent Labs    04/26/19 0320 04/26/19 0320 04/26/19 0534 04/27/19 0504 08/23/19 0000  NA 143   < > 143 141 142  K 3.7   < > 3.4* 4.4 4.1  CL 109   < > 114* 110 111*  CO2  --   --  23 22 26*  GLUCOSE 115*  --  114* 95  --   BUN 36*   < > 38* 36* 31*  CREATININE 1.10   < > 1.04 1.07 1.0  CALCIUM  --   --  8.2* 8.3* 8.5*   < > = values in this interval not displayed.   Recent Labs    04/26/19 0534  04/27/19 0504 08/23/19 0000  AST 24 22 15   ALT 20 23 12   ALKPHOS 36* 34* 43  BILITOT 0.6 0.6  --   PROT 5.4* 5.4*  --   ALBUMIN 2.8* 2.5* 3.1*   Recent Labs    04/26/19 0128 04/26/19 0320 04/26/19 0534 04/27/19 0504 08/23/19 0000  WBC 3.2*   < > 2.8* 4.3 5.5  NEUTROABS 2.3   < > 1.9 2.6 3,894  HGB 15.5   < > 11.9* 12.2* 13.0*  HCT 50.0   < > 38.4* 38.3* 40*  MCV 96.9  --  97.5 95.0  --   PLT 139*   < > 123* 132* 137*   < > = values in this interval not displayed.   Lab Results  Component Value Date   TSH 0.994 04/26/2019   Lab Results  Component Value Date   HGBA1C 5 01/26/2018   Lab Results  Component Value Date   CHOL 146 11/24/2018   HDL 38 11/24/2018   LDLCALC 89 11/24/2018   LDLDIRECT 132.5 09/08/2012   TRIG 91 11/24/2018   CHOLHDL 2.7 11/15/2016    Significant Diagnostic Results in last 30 days:  No results found.  Assessment/Plan Frequent falls reported 09/23/19 the patient unwitnessed fall in his bathroom when the patient was found lying on his right side. The patient stated he slid down onto the floor and pulled the call light string using his feet, no apparent injury noted.  Close supervision and assistance for safety, the patient's lack of safety awareness and unsteady gait are contributory.   Major psychotic depression, recurrent ([facility] [place]) Psychotic featured depression/anxiety/delusion/hallucination, stable, continue  Sertralien200mg  qd, Mirtazapine 60mg  qd, Clonazepam 0.5mg  qhs, Bupropion 300mg  qd, Abilify off, Quetiapine 25mg  qam,  50mg  qd, MRI brain unremarkable.    Unsteady gait Uses w/c for mobility. Needs supervision/assistance for transfer.   Generalized osteoarthritis of multiple sites Stable, pain is controlled, continue Tylenol.   Hypothyroidism Stable, continue Levothyroxine, TSH 0.99 04/26/19  Chronic constipation Stable, continue Linzess qd, MiraLax qd.    Benign essential tremor Stable, continue Topamax.   Benign prostatic  hyperplasia with urinary obstruction No urinary retention, continue Tamsulosin.   Allergic rhinitis Stable, continue daily Loratadine.     Family/ staff Communication: plan of care reviewed with the patient and charge nurse.   Labs/tests ordered:  none  Time spend 35 minutes.

## 2019-09-24 NOTE — Assessment & Plan Note (Signed)
Psychotic featured depression/anxiety/delusion/hallucination, stable, continue  Sertralien200mg  qd, Mirtazapine 60mg  qd, Clonazepam 0.5mg  qhs, Bupropion 300mg  qd, Abilify off, Quetiapine 25mg  qam,  50mg  qd, MRI brain unremarkable.

## 2019-09-25 ENCOUNTER — Encounter: Payer: Self-pay | Admitting: Nurse Practitioner

## 2019-10-02 ENCOUNTER — Encounter: Payer: Self-pay | Admitting: Nurse Practitioner

## 2019-10-02 ENCOUNTER — Non-Acute Institutional Stay (SKILLED_NURSING_FACILITY): Payer: Medicare Other | Admitting: Nurse Practitioner

## 2019-10-02 DIAGNOSIS — E039 Hypothyroidism, unspecified: Secondary | ICD-10-CM | POA: Diagnosis not present

## 2019-10-02 DIAGNOSIS — N401 Enlarged prostate with lower urinary tract symptoms: Secondary | ICD-10-CM

## 2019-10-02 DIAGNOSIS — F333 Major depressive disorder, recurrent, severe with psychotic symptoms: Secondary | ICD-10-CM

## 2019-10-02 DIAGNOSIS — J309 Allergic rhinitis, unspecified: Secondary | ICD-10-CM

## 2019-10-02 DIAGNOSIS — G250 Essential tremor: Secondary | ICD-10-CM

## 2019-10-02 DIAGNOSIS — K5909 Other constipation: Secondary | ICD-10-CM

## 2019-10-02 DIAGNOSIS — M159 Polyosteoarthritis, unspecified: Secondary | ICD-10-CM | POA: Diagnosis not present

## 2019-10-02 DIAGNOSIS — I10 Essential (primary) hypertension: Secondary | ICD-10-CM

## 2019-10-02 DIAGNOSIS — N138 Other obstructive and reflux uropathy: Secondary | ICD-10-CM

## 2019-10-02 NOTE — Assessment & Plan Note (Signed)
TSH wnl 04/2019, continue Levothyroxine.

## 2019-10-02 NOTE — Assessment & Plan Note (Signed)
Psychotic featured depression/anxiety/delusion/hallucinatio, stabilizing, continu eSertralien200mg  qd, Mirtazapine 60mg  qd, Clonazepam 0.5mg  qhs, Bupropion 300mg  qd, Abilifyoff, Quetiapine 25mg  qam, 50mg  qd, MRI brainunremarkable.

## 2019-10-02 NOTE — Assessment & Plan Note (Signed)
W/c for mobility, aches/pains is controlled, continue Tylenol.

## 2019-10-02 NOTE — Assessment & Plan Note (Signed)
Stable, continue Topamax.

## 2019-10-02 NOTE — Progress Notes (Signed)
Location:    [facility] Room Number: 37 Place of Service:  SNF (31) Provider: Lennie Odor Tynslee Bowlds NP  [name], MD  Patient Care Team: [name], MD as PCP - General (Internal Medicine) [name], MD as Consulting Physician (Urology) [name], MD as Consulting Physician (Neurology) [name], MD as Consulting Physician (Dermatology) [name], MD (Inactive) as Consulting Physician (Gastroenterology) [name], MD as Consulting Physician (Orthopedic Surgery) [name], MD as Consulting Physician (Cardiology) [name], MD as Consulting Physician (Ophthalmology) [name], MD as Referring Physician (Psychiatry) [name], MD as Consulting Physician (General Surgery) [name], MD as Consulting Physician (Orthopedic Surgery) Dutton, [name], NP as Nurse Practitioner (Family Medicine) Lila Lufkin X, NP as Nurse Practitioner (Internal Medicine)  Extended Emergency Contact Information Primary Emergency Contact: Rubinstein,[name] Address: [address]          [place], [place] [address] [place] Phone: [phone] Relation: Spouse Secondary Emergency Contact: Durwin Glaze Mobile Phone: [phone] Relation: Daughter  Code Status:  DNR Goals of care: Advanced Directive information Advanced Directives 06/14/2019  Does Patient Have a Medical Advance Directive? Yes  Type of Advance Directive Out of facility DNR (pink MOST or yellow form)  Does patient want to make changes to medical advance directive? No - Patient declined  Copy of [place] in Chart? Yes - validated most recent copy scanned in chart (See row information)  Pre-existing out of facility DNR order (yellow form or pink MOST form) Pink MOST form placed in chart (order not valid for inpatient use)     Chief Complaint  Patient presents with  . Medical Management of Chronic  Issues    HPI:  Pt is a 86 y.o. male seen today for medical management of chronic diseases.    Psychotic featured depression/anxiety/delusion/hallucinatio, takesSertralien200mg  qd, Mirtazapine 60mg  qd, Clonazepam 0.5mg  qhs, Bupropion 300mg  qd, Abilifyoff, Quetiapine 25mg  qam, 50mg  qd, MRI brainunremarkable. Hx of OA, stable, on Tylenol 1000mg  tid. Hypothyroidism, stable, on Levothyroxine 69mcg qd 6x/wk, 27mcg qd 1x/wk.  Constipation, stable, on Linzess qd, MiraLax qd.  BPH, stable, on Tamsulosin.  Essential tremor, on Topamax BID.  Rhinitis, takes Loratadine 10mg  qd.   Past Medical History:  Diagnosis Date  . Acute urinary retention 01/28/2013  . AKI (acute kidney injury) ([place]) 01/26/2013  . Altered mental status 09/09/2013  . Anemia    B12 deficiency  . ANXIETY 06/07/2007   Qualifier: Diagnosis of  By: Talbert Cage CMA ([place]), June    . ARTHRITIS 06/07/2007   Qualifier: Diagnosis of  By: Talbert Cage CMA ([place]), June    . Balance problems 04/05/2011  . Benign essential tremor   . Benign fibroma of prostate 10/11/2011  . Bilateral inguinal hernia 10/09/2012  . Bilateral leg edema 09/13/2012  . Bladder retention 02/05/2013  . Bradycardia, sinus 08/02/2012  . CAROTID ARTERY DISEASE 03/31/2007   Qualifier: Diagnosis of  By: Linna Darner MD, Gwyndolyn Saxon    . Carotid stenosis    s/p L CEA  . Cataract, nuclear 03/21/2014  . 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 Cervical spinal stenosis   . Cervical stenosis of spine   . Critical lower limb ischemia   . Degeneration macular 11/02/2011  . Depression    Dr Albertine Patricia  . Difficulty in walking(719.7) 04/05/2011  . ESOPHAGITIS 08/06/2002   Qualifier: Diagnosis of  By: Talbert Cage CMA ([place]), June    .  Essential and other specified forms of tremor 04/18/2013  . Fall at home 01/06/2017   left hip pain  . Falls   . Family history of colon cancer 07/24/2012  . Fecal impaction ([place]) 10/11/2012  .  Femoral hernia, bilateral s/p lap repair 01/16/2013 01/16/2013  . Glaucoma, compensated 03/21/2014  . H/O cardiovascular stress test    a. 02/2003 -> no ischemia/infarct.  Marland Kitchen HAMMER TOE 04/23/2010   Qualifier: Diagnosis of  By: Linna Darner MD, Gwyndolyn Saxon    . Hematoma of leg   . Hip hematoma, right 09/09/2013  . History of shingles 2009  . HTN (hypertension) 08/22/2012  . Hx of echocardiogram    Echocardiogram 08/01/12: Mild LVH, EF 55-60%, normal wall motion.  . Hydrocele 10/11/2011  . Hyperlipidemia   . 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 Hypothyroidism   . Macular degeneration disease   . Macular edema   . Migraines    Dr Jannifer Franklin  . MORTON'S NEUROMA, LEFT 08/22/2007   Qualifier: Diagnosis of  By: Linna Darner MD, Gwyndolyn Saxon    . PAD (peripheral artery disease) ([place])   . Peripheral neuropathy   . Personal history of colonic polyps 01/05/2013   2014 2 mm adenomatous polyp   . PREMATURE ATRIAL CONTRACTIONS 03/14/2006   Qualifier: Diagnosis of  By: Linna Darner MD, Gwendolyn Lima 12/16/2011  . SPINAL STENOSIS 03/14/2006   Qualifier: Diagnosis of  By: Linna Darner MD, William   Cervical spine    . Testosterone deficiency    Dr Lawerance Bach, [facility]  . Unspecified vitamin D deficiency 08/06/2012  . Urge incontinence 10/09/2012  . Urinary tract infection, site not specified 02/13/2013  . Weakness 08/02/2012   Past Surgical History:  Procedure Laterality Date  . APPENDECTOMY  1941  . BREAST CYST EXCISION Left 01/16/2013   Procedure: MASS EXCISION AXILLA;  Surgeon: Adin Hector, MD;  Location: [facility];  Service: General;  Laterality: Left;  . CAROTID ENDARTERECTOMY  2005    L  . CATARACT EXTRACTION Right   . COLONOSCOPY  2014   Dr. Deatra Ina  . EYE SURGERY Right 02/2012   retina  . INGUINAL HERNIA REPAIR Bilateral 01/16/2013   Procedure: LAPAROSCOPIC BILATERAL INGUINAL DIRECT AND INDIRECT, FEMORAL, AND OBTURATOR HERNIAS;  Surgeon: Adin Hector, MD;  Location: [facility];  Service: General;  Laterality: Bilateral;  .  INSERTION OF MESH N/A 01/16/2013   Procedure: INSERTION OF MESH;  Surgeon: Adin Hector, MD;  Location: [facility];  Service: General;  Laterality: N/A;  . LUMBAR LAMINECTOMY    . TONSILLECTOMY AND ADENOIDECTOMY    . TOTAL HIP ARTHROPLASTY  2005   L    Allergies  Allergen Reactions  . Latex Rash  . Tape Rash    Paper tape is ok to use     Allergies as of 10/02/2019      Reactions   Latex Rash   Tape Rash   Paper tape is ok to use      Medication List       Accurate as of October 02, 2019 11:59 PM. If you have any questions, ask your nurse or doctor.        acetaminophen 500 MG tablet Commonly known as: TYLENOL Take 1,000 mg by mouth 3 (three) times daily. And q6hprn for pain/fever   aspirin 81 MG tablet Take 81 mg by mouth every morning.   atorvastatin 20 MG tablet Commonly known as: LIPITOR Take 20 mg by mouth daily.   B-complex with vitamin C  tablet Take 1 tablet by mouth daily.   bimatoprost 0.01 % Soln Commonly known as: LUMIGAN Place 1 drop into both eyes at bedtime.   buPROPion 300 MG 24 hr tablet Commonly known as: WELLBUTRIN XL Take 300 mg by mouth daily. per psychiatry recommendation   clonazePAM 0.5 MG tablet Commonly known as: KLONOPIN Take 1 tablet (0.5 mg total) by mouth at bedtime.   erythromycin ophthalmic ointment Place 1 application into both eyes at bedtime.   ICAPS PO Take 1 tablet by mouth daily.   ketoconazole 2 % cream Commonly known as: NIZORAL Apply 1 application topically 2 (two) times daily as needed for irritation. Apply to buttocks.   lactose free nutrition Liqd Take 237 mLs by mouth 2 (two) times daily between meals.   levothyroxine 50 MCG tablet Commonly known as: SYNTHROID Take 25 mcg by mouth. On Sundays   levothyroxine 50 MCG tablet Commonly known as: SYNTHROID Take 50 mcg by mouth See admin instructions. Take daily except for Sundays 1/2 Tablet = 71mcg.   linaclotide 145 MCG Caps capsule Commonly known as:  LINZESS Take 1 capsule (145 mcg total) by mouth daily before breakfast.   loratadine 10 MG tablet Commonly known as: CLARITIN Take 10 mg by mouth daily.   melatonin 3 MG Tabs tablet Take 3 mg by mouth at bedtime.   mirtazapine 30 MG tablet Commonly known as: REMERON Take 60 mg by mouth at bedtime.   polyethylene glycol 17 g packet Commonly known as: MIRALAX / GLYCOLAX Take 17 g by mouth daily. Hold for loose stool   QUEtiapine 25 MG tablet Commonly known as: SEROQUEL Take 25 mg by mouth every morning.   QUEtiapine 50 MG tablet Commonly known as: SEROQUEL Take 50 mg by mouth daily.   Robafen DM Cgh/Chest Congest 10-100 MG/5ML liquid Generic drug: dextromethorphan-guaiFENesin Take 10 mLs by mouth every 6 (six) hours as needed for cough.   sertraline 100 MG tablet Commonly known as: ZOLOFT Take 200 mg by mouth every morning. In the morning. 200mg  in Total.   Systane 0.4-0.3 % Soln Generic drug: Polyethyl Glycol-Propyl Glycol Apply 1 drop to eye 4 (four) times daily.   tamsulosin 0.4 MG Caps capsule Commonly known as: FLOMAX Take 0.4 mg by mouth daily after supper.   topiramate 25 MG tablet Commonly known as: TOPAMAX Take 25 mg by mouth 2 (two) times daily.   Vitamin D3 25 MCG (1000 UT) Caps Take 1,000 Units by mouth daily.   zinc oxide 20 % ointment Apply 1 application topically as needed for irritation.       Review of Systems  Constitutional: Negative for appetite change, fatigue, fever and unexpected weight change.  HENT: Positive for hearing loss. Negative for congestion and voice change.   Eyes: Positive for visual disturbance.  Respiratory: Negative for cough and shortness of breath.   Cardiovascular: Negative for leg swelling.  Gastrointestinal: Negative for abdominal pain and constipation.  Genitourinary: Negative for difficulty urinating and dysuria.       Incontinent of urine.   Musculoskeletal: Positive for arthralgias and gait problem.  Skin:  Negative for color change.  Neurological: Positive for tremors. Negative for dizziness, speech difficulty and light-headedness.       Memory lapses. Fine tremor in fingers.   Psychiatric/Behavioral: Positive for hallucinations. Negative for sleep disturbance. The patient is nervous/anxious.        Delusions, anxiety, hallucination improved    Immunization History  Administered Date(s) Administered  . DTaP 11/05/2005  . Hepatitis  B [birth year]  . Influenza Split 11/12/2011, 11/29/2013  . Influenza Whole 10/30/2007, 11/12/2008, 11/26/2009  . Influenza, High Dose Seasonal PF 12/31/2015, 11/24/2016, 11/30/2018  . Influenza,inj,quad, With Preservative 10/16/2016  . Influenza-Unspecified 11/28/2017  . Moderna SARS-COVID-2 Vaccination 02/19/2019, 03/19/2019  . PPD Test 06/23/2011  . Pneumococcal Conjugate-13 01/14/2016  . Pneumococcal Polysaccharide-23 02/16/1999  . Tdap 01/13/2017  . Varicella 11/09/2005  . Zoster 08/30/2006   Pertinent  Health Maintenance Due  Topic Date Due  . INFLUENZA VACCINE  09/16/2019  . PNA vac Low Risk Adult  Completed   Fall Risk  10/28/2017 10/27/2016 06/09/2016 02/03/2016 10/28/2015  Falls in the past year? Yes Yes Yes Yes Yes  Number falls in past yr: 1 2 or more 2 or more 2 or more 2 or more  Comment - - - 2 falls in last 2 months -  Injury with Fall? Yes Yes No Yes No  Comment - L hip - scraps and bruises -  Risk Factor Category  - - - - -  Risk for fall due to : - - - - Impaired balance/gait  Follow up - - - - -   Functional Status Survey:    Vitals:   10/02/19 1507  BP: (!) 189/79  Pulse: 60  Resp: 15  Temp: 98.5 F (36.9 C)  SpO2: 94%  Weight: 138 lb 4.8 oz (62.7 kg)  Height: 6' (1.829 m)   Body mass index is 18.76 kg/m. Physical Exam Vitals and nursing note reviewed.  Constitutional:      Appearance: Normal appearance.  HENT:     Head: Normocephalic and atraumatic.  Eyes:     Extraocular Movements: Extraocular movements intact.      Conjunctiva/sclera: Conjunctivae normal.     Pupils: Pupils are equal, round, and reactive to light.     Comments: Left eye blind.   Cardiovascular:     Rate and Rhythm: Normal rate and regular rhythm.     Heart sounds: No murmur heard.   Pulmonary:     Effort: Pulmonary effort is normal.     Breath sounds: No rales.  Abdominal:     General: Bowel sounds are normal.     Palpations: Abdomen is soft.     Tenderness: There is no abdominal tenderness.  Musculoskeletal:     Cervical back: Normal range of motion and neck supple.     Right lower leg: No edema.     Left lower leg: No edema.  Skin:    General: Skin is warm and dry.  Neurological:     General: No focal deficit present.     Mental Status: He is alert. Mental status is at baseline.     Gait: Gait abnormal.     Comments: Oriented to person, place. Fine, mild resting tremor in fingers, not disabling.   Psychiatric:     Comments: Less hallucination, delusion     Labs reviewed: Recent Labs    04/26/19 0320 04/26/19 0320 04/26/19 0534 04/27/19 0504 08/23/19 0000  NA 143   < > 143 141 142  K 3.7   < > 3.4* 4.4 4.1  CL 109   < > 114* 110 111*  CO2  --   --  23 22 26*  GLUCOSE 115*  --  114* 95  --   BUN 36*   < > 38* 36* 31*  CREATININE 1.10   < > 1.04 1.07 1.0  CALCIUM  --   --  8.2* 8.3* 8.5*   < > =  values in this interval not displayed.   Recent Labs    04/26/19 0534 04/27/19 0504 08/23/19 0000  AST 24 22 15   ALT 20 23 12   ALKPHOS 36* 34* 43  BILITOT 0.6 0.6  --   PROT 5.4* 5.4*  --   ALBUMIN 2.8* 2.5* 3.1*   Recent Labs    04/26/19 0128 04/26/19 0320 04/26/19 0534 04/27/19 0504 08/23/19 0000  WBC 3.2*   < > 2.8* 4.3 5.5  NEUTROABS 2.3   < > 1.9 2.6 3,894  HGB 15.5   < > 11.9* 12.2* 13.0*  HCT 50.0   < > 38.4* 38.3* 40*  MCV 96.9  --  97.5 95.0  --   PLT 139*   < > 123* 132* 137*   < > = values in this interval not displayed.   Lab Results  Component Value Date   TSH 0.994 04/26/2019    Lab Results  Component Value Date   HGBA1C 5 01/26/2018   Lab Results  Component Value Date   CHOL 146 11/24/2018   HDL 38 11/24/2018   LDLCALC 89 11/24/2018   LDLDIRECT 132.5 09/08/2012   TRIG 91 11/24/2018   CHOLHDL 2.7 11/15/2016    Significant Diagnostic Results in last 30 days:  No results found.  Assessment/Plan Major psychotic depression, recurrent (HCC) Psychotic featured depression/anxiety/delusion/hallucinatio, stabilizing, continu eSertralien200mg  qd, Mirtazapine 60mg  qd, Clonazepam 0.5mg  qhs, Bupropion 300mg  qd, Abilifyoff, Quetiapine 25mg  qam, 50mg  qd, MRI brainunremarkable.   Generalized osteoarthritis of multiple sites W/c for mobility, aches/pains is controlled, continue Tylenol.   Hypothyroidism TSH wnl 04/2019, continue Levothyroxine.   Benign essential tremor Stable, continue Topamax.   Benign prostatic hyperplasia with urinary obstruction No urinary retention, continue Tamsulosin.   Chronic constipation Stable, continue Linzess, MiraLax.   Allergic rhinitis Stable, continue Claritin.   Hypertension Bp 134/35mmHg 10/02/19   Family/ staff Communication: plan of care reviewed with the patient and charge nurse.   Labs/tests ordered: none  Time spend 25 minutes

## 2019-10-02 NOTE — Assessment & Plan Note (Signed)
No urinary retention, continue Tamsulosin 

## 2019-10-02 NOTE — Assessment & Plan Note (Signed)
Stable, continue Linzess, MiraLax.

## 2019-10-02 NOTE — Assessment & Plan Note (Signed)
Stable, continue Claritin.  

## 2019-10-04 NOTE — Assessment & Plan Note (Signed)
Bp 134/65mmHg 10/02/19

## 2019-10-05 ENCOUNTER — Encounter: Payer: Self-pay | Admitting: Nurse Practitioner

## 2019-10-05 ENCOUNTER — Non-Acute Institutional Stay (SKILLED_NURSING_FACILITY): Payer: Medicare Other | Admitting: Nurse Practitioner

## 2019-10-05 DIAGNOSIS — E039 Hypothyroidism, unspecified: Secondary | ICD-10-CM

## 2019-10-05 DIAGNOSIS — R296 Repeated falls: Secondary | ICD-10-CM | POA: Diagnosis not present

## 2019-10-05 DIAGNOSIS — F333 Major depressive disorder, recurrent, severe with psychotic symptoms: Secondary | ICD-10-CM

## 2019-10-05 DIAGNOSIS — N401 Enlarged prostate with lower urinary tract symptoms: Secondary | ICD-10-CM

## 2019-10-05 DIAGNOSIS — M159 Polyosteoarthritis, unspecified: Secondary | ICD-10-CM | POA: Diagnosis not present

## 2019-10-05 DIAGNOSIS — G250 Essential tremor: Secondary | ICD-10-CM

## 2019-10-05 DIAGNOSIS — K5909 Other constipation: Secondary | ICD-10-CM

## 2019-10-05 DIAGNOSIS — R2681 Unsteadiness on feet: Secondary | ICD-10-CM | POA: Diagnosis not present

## 2019-10-05 DIAGNOSIS — J309 Allergic rhinitis, unspecified: Secondary | ICD-10-CM

## 2019-10-05 DIAGNOSIS — N138 Other obstructive and reflux uropathy: Secondary | ICD-10-CM

## 2019-10-05 NOTE — Assessment & Plan Note (Signed)
Mild, continue Topamax.

## 2019-10-05 NOTE — Assessment & Plan Note (Signed)
Psychotic featured depression/anxiety/delusion/hallucinatio,takesSertralien200mg qd, Mirtazapine 60mg qd, Clonazepam 0.5mg qhs, Bupropion 300mg qd, Abilifyoff, Quetiapine 25mg qam, 50mg qd, MRI brainunremarkable.  

## 2019-10-05 NOTE — Assessment & Plan Note (Signed)
Hx of OA, stable, on Tylenol 1000mg tid.  

## 2019-10-05 NOTE — Progress Notes (Signed)
Location:   S[facility] [place] Room Number: 37 Place of Service:  SNF (31) Provider: [facility] Briona Korpela NP  [name], MD  Patient Care Team: [name], MD as PCP - General (Internal Medicine) [name], MD as Consulting Physician (Urology) [name], MD as Consulting Physician (Neurology) [name], MD as Consulting Physician (Dermatology) [name], MD (Inactive) as Consulting Physician (Gastroenterology) [name], MD as Consulting Physician (Orthopedic Surgery) [name], MD as Consulting Physician (Cardiology) [name], MD as Consulting Physician (Ophthalmology) [name], MD as Referring Physician (Psychiatry) [name], MD as Consulting Physician (General Surgery) [name], MD as Consulting Physician (Orthopedic Surgery) [place], [name], NP as Nurse Practitioner (Family Medicine) Vola Beneke X, NP as Nurse Practitioner (Internal Medicine)  Extended Emergency Contact Information Primary Emergency Contact: Cullinan,[name] Address: [address]          [place], [place] [address] [place] Phone: [phone] Relation: Spouse Secondary Emergency Contact: Durwin Glaze Mobile Phone: [phone] Relation: Daughter  Code Status:  DNR Goals of care: Advanced Directive information Advanced Directives 10/05/2019  Does Patient Have a Medical Advance Directive? Yes  Type of Paramedic of [place];Out of facility DNR (pink MOST or yellow form)  Does patient want to make changes to medical advance directive? No - Patient declined  Copy of [place] in Chart? Yes - validated most recent copy scanned in chart (See row information)  Pre-existing out of facility DNR order (yellow form or pink MOST form) Pink MOST form placed in chart (order not valid for inpatient use);Yellow form placed in chart (order not valid for inpatient use)      Chief Complaint  Patient presents with  . Acute Visit    Fall    HPI:  Pt is a 86 y.o. male seen today for an acute visit for fall reported when the patient was found in sitting position next to his bed, the patient stated he lost balance, slid to the floor when attempted transferring self w/o assistance, the patient needs assistance for transfer, w/c for mobility, reminders needed due to memory lapses.              Psychotic featured depression/anxiety/delusion/hallucinatio, takesSertralien200mg  qd, Mirtazapine 60mg  qd, Clonazepam 0.5mg  qhs, Bupropion 300mg  qd, Abilifyoff, Quetiapine 25mg  qam, 50mg  qd, MRI brainunremarkable. Hx of OA, stable, on Tylenol 1000mg  tid. Hypothyroidism, stable, on Levothyroxine 9mcg qd 6x/wk, 28mcg qd 1x/wk.  Constipation, stable, on Linzess qd, MiraLax qd.  BPH, stable, on Tamsulosin.  Essential tremor, on Topamax BID.  Rhinitis, takes Loratadine 10mg  qd.       Past Medical History:  Diagnosis Date  . Acute urinary retention 01/28/2013  . AKI (acute kidney injury) ([place]) 01/26/2013  . Altered mental status 09/09/2013  . Anemia    B12 deficiency  . ANXIETY 06/07/2007   Qualifier: Diagnosis of  By: Talbert Cage CMA ([place]), June    . ARTHRITIS 06/07/2007   Qualifier: Diagnosis of  By: Talbert Cage CMA ([place]), June    . Balance problems 04/05/2011  . Benign essential tremor   . Benign fibroma of prostate 10/11/2011  . Bilateral inguinal hernia 10/09/2012  . Bilateral leg edema 09/13/2012  . Bladder retention 02/05/2013  . Bradycardia, sinus 08/02/2012  . CAROTID ARTERY DISEASE 03/31/2007   Qualifier: Diagnosis of  By: Linna Darner MD, Gwyndolyn Saxon    . Carotid stenosis  s/p L CEA  . Cataract, nuclear 03/21/2014  . 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 Cervical spinal stenosis   . Cervical stenosis of spine   . Critical lower limb ischemia   . Degeneration macular 11/02/2011  .  Depression    Dr Albertine Patricia  . Difficulty in walking(719.7) 04/05/2011  . ESOPHAGITIS 08/06/2002   Qualifier: Diagnosis of  By: Talbert Cage CMA ([place]), June    . Essential and other specified forms of tremor 04/18/2013  . Fall at home 01/06/2017   left hip pain  . Falls   . Family history of colon cancer 07/24/2012  . Fecal impaction ([place]) 10/11/2012  . Femoral hernia, bilateral s/p lap repair 01/16/2013 01/16/2013  . Glaucoma, compensated 03/21/2014  . H/O cardiovascular stress test    a. 02/2003 -> no ischemia/infarct.  Marland Kitchen HAMMER TOE 04/23/2010   Qualifier: Diagnosis of  By: Linna Darner MD, Gwyndolyn Saxon    . Hematoma of leg   . Hip hematoma, right 09/09/2013  . History of shingles 2009  . HTN (hypertension) 08/22/2012  . Hx of echocardiogram    Echocardiogram 08/01/12: Mild LVH, EF 55-60%, normal wall motion.  . Hydrocele 10/11/2011  . Hyperlipidemia   . 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 Hypothyroidism   . Macular degeneration disease   . Macular edema   . Migraines    Dr Jannifer Franklin  . MORTON'S NEUROMA, LEFT 08/22/2007   Qualifier: Diagnosis of  By: Linna Darner MD, Gwyndolyn Saxon    . PAD (peripheral artery disease) ([place])   . Peripheral neuropathy   . Personal history of colonic polyps 01/05/2013   2014 2 mm adenomatous polyp   . PREMATURE ATRIAL CONTRACTIONS 03/14/2006   Qualifier: Diagnosis of  By: Linna Darner MD, Gwendolyn Lima 12/16/2011  . SPINAL STENOSIS 03/14/2006   Qualifier: Diagnosis of  By: Linna Darner MD, William   Cervical spine    . Testosterone deficiency    Dr Lawerance Bach, [facility]  . Unspecified vitamin D deficiency 08/06/2012  . Urge incontinence 10/09/2012  . Urinary tract infection, site not specified 02/13/2013  . Weakness 08/02/2012   Past Surgical History:  Procedure Laterality Date  . APPENDECTOMY  1941  . BREAST CYST EXCISION Left 01/16/2013   Procedure: MASS EXCISION AXILLA;  Surgeon: Adin Hector, MD;  Location: [place];  Service: General;  Laterality: Left;  . CAROTID ENDARTERECTOMY  2005     L  . CATARACT EXTRACTION Right   . COLONOSCOPY  2014   Dr. Deatra Ina  . EYE SURGERY Right 02/2012   retina  . INGUINAL HERNIA REPAIR Bilateral 01/16/2013   Procedure: LAPAROSCOPIC BILATERAL INGUINAL DIRECT AND INDIRECT, FEMORAL, AND OBTURATOR HERNIAS;  Surgeon: Adin Hector, MD;  Location: [place];  Service: General;  Laterality: Bilateral;  . INSERTION OF MESH N/A 01/16/2013   Procedure: INSERTION OF MESH;  Surgeon: Adin Hector, MD;  Location: [place];  Service: General;  Laterality: N/A;  . LUMBAR LAMINECTOMY    . TONSILLECTOMY AND ADENOIDECTOMY    . TOTAL HIP ARTHROPLASTY  2005   L    Allergies  Allergen Reactions  . Latex Rash  . Tape Rash    Paper tape is ok to use     Allergies as of 10/05/2019      Reactions   Latex Rash   Tape Rash   Paper tape is ok to use      Medication List       Accurate as of October 05, 2019  4:37 PM. If  you have any questions, ask your nurse or doctor.        acetaminophen 500 MG tablet Commonly known as: TYLENOL Take 1,000 mg by mouth 3 (three) times daily. And q6hprn for pain/fever   aspirin 81 MG tablet Take 81 mg by mouth every morning.   atorvastatin 20 MG tablet Commonly known as: LIPITOR Take 20 mg by mouth daily.   B-complex with vitamin C tablet Take 1 tablet by mouth daily.   bimatoprost 0.01 % Soln Commonly known as: LUMIGAN Place 1 drop into both eyes at bedtime.   buPROPion 300 MG 24 hr tablet Commonly known as: WELLBUTRIN XL Take 300 mg by mouth daily. per psychiatry recommendation   clonazePAM 0.5 MG tablet Commonly known as: KLONOPIN Take 1 tablet (0.5 mg total) by mouth at bedtime.   erythromycin ophthalmic ointment Place 1 application into both eyes at bedtime.   ICAPS PO Take 1 tablet by mouth daily.   ketoconazole 2 % cream Commonly known as: NIZORAL Apply 1 application topically 2 (two) times daily as needed for irritation. Apply to buttocks.   lactose free nutrition Liqd Take 237 mLs by mouth  2 (two) times daily between meals.   levothyroxine 50 MCG tablet Commonly known as: SYNTHROID Take 25 mcg by mouth. On Sundays   levothyroxine 50 MCG tablet Commonly known as: SYNTHROID Take 50 mcg by mouth See admin instructions. Take daily except for Sundays 1/2 Tablet = 58mcg.   linaclotide 145 MCG Caps capsule Commonly known as: LINZESS Take 1 capsule (145 mcg total) by mouth daily before breakfast.   loratadine 10 MG tablet Commonly known as: CLARITIN Take 10 mg by mouth daily.   melatonin 3 MG Tabs tablet Take 3 mg by mouth at bedtime.   mirtazapine 30 MG tablet Commonly known as: REMERON Take 60 mg by mouth at bedtime.   polyethylene glycol 17 g packet Commonly known as: MIRALAX / GLYCOLAX Take 17 g by mouth daily. Hold for loose stool   QUEtiapine 25 MG tablet Commonly known as: SEROQUEL Take 25 mg by mouth every morning.   QUEtiapine 50 MG tablet Commonly known as: SEROQUEL Take 50 mg by mouth daily.   Robafen DM Cgh/Chest Congest 10-100 MG/5ML liquid Generic drug: dextromethorphan-guaiFENesin Take 10 mLs by mouth every 6 (six) hours as needed for cough.   sertraline 100 MG tablet Commonly known as: ZOLOFT Take 200 mg by mouth every morning. In the morning. 200mg  in Total.   Systane 0.4-0.3 % Soln Generic drug: Polyethyl Glycol-Propyl Glycol Apply 1 drop to eye 4 (four) times daily.   tamsulosin 0.4 MG Caps capsule Commonly known as: FLOMAX Take 0.4 mg by mouth daily after supper.   topiramate 25 MG tablet Commonly known as: TOPAMAX Take 25 mg by mouth 2 (two) times daily.   Vitamin D3 25 MCG (1000 UT) Caps Take 1,000 Units by mouth daily.   zinc oxide 20 % ointment Apply 1 application topically as needed for irritation.       Review of Systems  Constitutional: Negative for appetite change, fatigue and fever.  HENT: Positive for hearing loss. Negative for congestion and voice change.   Eyes: Positive for visual disturbance.  Respiratory:  Negative for cough and shortness of breath.   Cardiovascular: Negative for leg swelling.  Gastrointestinal: Negative for abdominal pain and constipation.  Genitourinary: Negative for difficulty urinating and dysuria.       Incontinent of urine.   Musculoskeletal: Positive for arthralgias and gait problem.  Skin:  Negative for color change.  Neurological: Positive for tremors. Negative for speech difficulty, light-headedness and headaches.       Memory lapses. Fine tremor in fingers.   Psychiatric/Behavioral: Positive for hallucinations. Negative for sleep disturbance. The patient is nervous/anxious.        Delusions, anxiety, hallucination improved    Immunization History  Administered Date(s) Administered  . DTaP 11/05/2005  . Hepatitis B Dec 20, 1932  . Influenza Split 11/12/2011, 11/29/2013  . Influenza Whole 10/30/2007, 11/12/2008, 11/26/2009  . Influenza, High Dose Seasonal PF 12/31/2015, 11/24/2016, 11/30/2018  . Influenza,inj,quad, With Preservative 10/16/2016  . Influenza-Unspecified 11/28/2017  . Moderna SARS-COVID-2 Vaccination 02/19/2019, 03/19/2019  . PPD Test 06/23/2011  . Pneumococcal Conjugate-13 01/14/2016  . Pneumococcal Polysaccharide-23 02/16/1999  . Tdap 01/13/2017  . Varicella 11/09/2005  . Zoster 08/30/2006   Pertinent  Health Maintenance Due  Topic Date Due  . INFLUENZA VACCINE  09/16/2019  . PNA vac Low Risk Adult  Completed   Fall Risk  10/28/2017 10/27/2016 06/09/2016 02/03/2016 10/28/2015  Falls in the past year? Yes Yes Yes Yes Yes  Number falls in past yr: 1 2 or more 2 or more 2 or more 2 or more  Comment - - - 2 falls in last 2 months -  Injury with Fall? Yes Yes No Yes No  Comment - L hip - scraps and bruises -  Risk Factor Category  - - - - -  Risk for fall due to : - - - - Impaired balance/gait  Follow up - - - - -   Functional Status Survey:    Vitals:   10/05/19 1512  BP: (!) 159/86  Pulse: 70  Resp: 20  Temp: 97.6 F (36.4 C)  SpO2:  95%  Weight: 138 lb 1.6 oz (62.6 kg)  Height: 6' (1.829 m)   Body mass index is 18.73 kg/m. Physical Exam Vitals and nursing note reviewed.  Constitutional:      Appearance: Normal appearance.  HENT:     Head: Normocephalic and atraumatic.     Mouth/Throat:     Mouth: Mucous membranes are moist.  Eyes:     Extraocular Movements: Extraocular movements intact.     Conjunctiva/sclera: Conjunctivae normal.     Pupils: Pupils are equal, round, and reactive to light.     Comments: Left eye blind.   Cardiovascular:     Rate and Rhythm: Normal rate and regular rhythm.     Heart sounds: No murmur heard.   Pulmonary:     Effort: Pulmonary effort is normal.     Breath sounds: No rales.  Abdominal:     General: Bowel sounds are normal.     Palpations: Abdomen is soft.     Tenderness: There is no abdominal tenderness.  Musculoskeletal:     Cervical back: Normal range of motion and neck supple.     Right lower leg: No edema.     Left lower leg: No edema.  Skin:    General: Skin is warm and dry.  Neurological:     General: No focal deficit present.     Mental Status: He is alert. Mental status is at baseline.     Gait: Gait abnormal.     Comments: Oriented to person, place. Fine, mild resting tremor in fingers, not disabling.   Psychiatric:     Comments: Less hallucination, delusion, smiled during today's visit.      Labs reviewed: Recent Labs    04/26/19 0320 04/26/19 0320 04/26/19 0534 04/27/19  1025 08/23/19 0000  NA 143   < > 143 141 142  K 3.7   < > 3.4* 4.4 4.1  CL 109   < > 114* 110 111*  CO2  --   --  23 22 26*  GLUCOSE 115*  --  114* 95  --   BUN 36*   < > 38* 36* 31*  CREATININE 1.10   < > 1.04 1.07 1.0  CALCIUM  --   --  8.2* 8.3* 8.5*   < > = values in this interval not displayed.   Recent Labs    04/26/19 0534 04/27/19 0504 08/23/19 0000  AST 24 22 15   ALT 20 23 12   ALKPHOS 36* 34* 43  BILITOT 0.6 0.6  --   PROT 5.4* 5.4*  --   ALBUMIN 2.8* 2.5*  3.1*   Recent Labs    04/26/19 0128 04/26/19 0320 04/26/19 0534 04/27/19 0504 08/23/19 0000  WBC 3.2*   < > 2.8* 4.3 5.5  NEUTROABS 2.3   < > 1.9 2.6 3,894  HGB 15.5   < > 11.9* 12.2* 13.0*  HCT 50.0   < > 38.4* 38.3* 40*  MCV 96.9  --  97.5 95.0  --   PLT 139*   < > 123* 132* 137*   < > = values in this interval not displayed.   Lab Results  Component Value Date   TSH 0.994 04/26/2019   Lab Results  Component Value Date   HGBA1C 5 01/26/2018   Lab Results  Component Value Date   CHOL 146 11/24/2018   HDL 38 11/24/2018   LDLCALC 89 11/24/2018   LDLDIRECT 132.5 09/08/2012   TRIG 91 11/24/2018   CHOLHDL 2.7 11/15/2016    Significant Diagnostic Results in last 30 days:  No results found.  Assessment/Plan Frequent falls fall reported when the patient was found in sitting position next to his bed, the patient stated he lost balance, slid to the floor when attempted transferring self w/o assistance, the patient needs assistance for transfer, w/c for mobility, reminders needed due to memory lapses. Close supervision, assistance needed for transfer, safety.   Unsteady gait The patient needs supervision/assistance for transfer, safety, continue w/c for mobility.   Major psychotic depression, recurrent ([place]) Psychotic featured depression/anxiety/delusion/hallucinatio, takesSertralien200mg  qd, Mirtazapine 60mg  qd, Clonazepam 0.5mg  qhs, Bupropion 300mg  qd, Abilifyoff, Quetiapine 25mg  qam, 50mg  qd, MRI brainunremarkable.   Generalized osteoarthritis of multiple sites Hx of OA, stable, on Tylenol 1000mg  tid.   Hypothyroidism stable, on Levothyroxine 56mcg qd 6x/wk, 19mcg qd 1x/wk. TSH wnl 04/2019   Chronic constipation stable, on Linzess qd, MiraLax qd.    Benign prostatic hyperplasia with urinary obstruction No urinary retention, continue Tamsulosin  Benign essential tremor Mild, continue Topamax.   Allergic rhinitis Stable, continue  Claritin    Family/ staff Communication: plan of care reviewed with the patient and charge nurse.   Labs/tests ordered: none  Time spend 25 minutes.

## 2019-10-05 NOTE — Assessment & Plan Note (Signed)
stable, on Linzess qd, MiraLax qd.   

## 2019-10-05 NOTE — Assessment & Plan Note (Signed)
The patient needs supervision/assistance for transfer, safety, continue w/c for mobility.

## 2019-10-05 NOTE — Assessment & Plan Note (Signed)
stable, on Levothyroxine 7mcg qd 6x/wk, 58mcg qd 1x/wk. TSH wnl 04/2019

## 2019-10-05 NOTE — Assessment & Plan Note (Signed)
No urinary retention, continue Tamsulosin 

## 2019-10-05 NOTE — Assessment & Plan Note (Signed)
Stable, continue Claritin.  

## 2019-10-05 NOTE — Assessment & Plan Note (Signed)
fall reported when the patient was found in sitting position next to his bed, the patient stated he lost balance, slid to the floor when attempted transferring self w/o assistance, the patient needs assistance for transfer, w/c for mobility, reminders needed due to memory lapses. Close supervision, assistance needed for transfer, safety.

## 2019-10-11 ENCOUNTER — Non-Acute Institutional Stay (SKILLED_NURSING_FACILITY): Payer: Medicare Other | Admitting: Internal Medicine

## 2019-10-11 ENCOUNTER — Encounter: Payer: Self-pay | Admitting: Internal Medicine

## 2019-10-11 DIAGNOSIS — I10 Essential (primary) hypertension: Secondary | ICD-10-CM

## 2019-10-11 DIAGNOSIS — R441 Visual hallucinations: Secondary | ICD-10-CM

## 2019-10-11 DIAGNOSIS — L89622 Pressure ulcer of left heel, stage 2: Secondary | ICD-10-CM | POA: Diagnosis not present

## 2019-10-11 DIAGNOSIS — K5909 Other constipation: Secondary | ICD-10-CM

## 2019-10-11 NOTE — Progress Notes (Signed)
Location:  [facility] Room Number: 37-A Place of Service:  SNF [phone]) Provider:  [name], MD  Patient Care Team: [name], MD as PCP - General (Internal Medicine) [name], MD as Consulting Physician (Urology) [name], MD as Consulting Physician (Neurology) [name], MD as Consulting Physician (Dermatology) [name], MD (Inactive) as Consulting Physician (Gastroenterology) [name], MD as Consulting Physician (Orthopedic Surgery) [name], MD as Consulting Physician (Cardiology) [name], MD as Consulting Physician (Ophthalmology) [name], MD as Referring Physician (Psychiatry) [name], MD as Consulting Physician (General Surgery) [name], MD as Consulting Physician (Orthopedic Surgery) [place], [name], NP as Nurse Practitioner (Family Medicine) Mast, Man X, NP as Nurse Practitioner (Internal Medicine)  Extended Emergency Contact Information Primary Emergency Contact: Nations,[name] Address: [address]          [place]          [place], [place] [address] [place] Phone: [phone] Relation: Spouse Secondary Emergency Contact: Durwin Glaze Mobile Phone: [phone] Relation: Daughter  Code Status:  DNR Goals of care: Advanced Directive information Advanced Directives 10/11/2019  Does Patient Have a Medical Advance Directive? Yes  Type of Paramedic of [place];Living will;Out of facility DNR (pink MOST or yellow form)  Does patient want to make changes to medical advance directive? No - Patient declined  Copy of [place] in Chart? Yes - validated most recent copy scanned in chart (See row information)  Pre-existing out of facility DNR order (yellow form or pink MOST form) Pink MOST form placed in chart (order not valid for inpatient use);Yellow form placed in chart (order not valid for inpatient use)       Chief Complaint  Patient presents with  . Acute Visit    Patient is seen for a heel wound    HPI:  Pt is an 86 y.o. male seen today for an acute visit for Left Heel Wound  He has h/o HTN, Hyperlipidemia, PAD, BPH , CKD stage 2, Hypothyroidism, and Major Depression Also Has h/o Cervial Stenosis with Compressive MyelopathyAnd recent CAP needing Hospitalization  Left Heel Wound Noticed by Nurses. No Pain. Seems Pressure Wound Constipation C/o Worsening Recently. No Abdominal Pain No Nausa or vomiting Hallucinations Under control on high dose of Seroquel  Past Medical History:  Diagnosis Date  . Acute urinary retention 01/28/2013  . AKI (acute kidney injury) ([place]) 01/26/2013  . Altered mental status 09/09/2013  . Anemia    B12 deficiency  . ANXIETY 06/07/2007   Qualifier: Diagnosis of  By: Talbert Cage CMA ([place]), June    . ARTHRITIS 06/07/2007   Qualifier: Diagnosis of  By: Talbert Cage CMA ([place]), June    . Balance problems 04/05/2011  . Benign essential tremor   . Benign fibroma of prostate 10/11/2011  . Bilateral inguinal hernia 10/09/2012  . Bilateral leg edema 09/13/2012  . Bladder retention 02/05/2013  . Bradycardia, sinus 08/02/2012  . CAROTID ARTERY DISEASE 03/31/2007   Qualifier: Diagnosis of  By: Linna Darner MD, Gwyndolyn Saxon    . Carotid stenosis    s/p L CEA  . Cataract, nuclear 03/21/2014  . 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 Cervical spinal stenosis   . Cervical stenosis of spine   . Critical lower limb ischemia   . Degeneration macular 11/02/2011  . Depression    Dr Albertine Patricia  . Difficulty in walking(719.7) 04/05/2011  . ESOPHAGITIS 08/06/2002   Qualifier:  Diagnosis of  By: Talbert Cage CMA Deborra Medina), June    . Essential and other specified forms of tremor 04/18/2013  . Fall at home 01/06/2017   left hip pain  . Falls   . Family history of colon cancer 07/24/2012  . Fecal impaction ([place]) 10/11/2012  . Femoral hernia, bilateral s/p lap repair 01/16/2013 01/16/2013  . Glaucoma,  compensated 03/21/2014  . H/O cardiovascular stress test    a. 02/2003 -> no ischemia/infarct.  Marland Kitchen HAMMER TOE 04/23/2010   Qualifier: Diagnosis of  By: Linna Darner MD, Gwyndolyn Saxon    . Hematoma of leg   . Hip hematoma, right 09/09/2013  . History of shingles 2009  . HTN (hypertension) 08/22/2012  . Hx of echocardiogram    Echocardiogram 08/01/12: Mild LVH, EF 55-60%, normal wall motion.  . Hydrocele 10/11/2011  . Hyperlipidemia   . 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 Hypothyroidism   . Macular degeneration disease   . Macular edema   . Migraines    Dr Jannifer Franklin  . MORTON'S NEUROMA, LEFT 08/22/2007   Qualifier: Diagnosis of  By: Linna Darner MD, Gwyndolyn Saxon    . PAD (peripheral artery disease) ([place])   . Peripheral neuropathy   . Personal history of colonic polyps 01/05/2013   2014 2 mm adenomatous polyp   . PREMATURE ATRIAL CONTRACTIONS 03/14/2006   Qualifier: Diagnosis of  By: Linna Darner MD, Gwendolyn Lima 12/16/2011  . SPINAL STENOSIS 03/14/2006   Qualifier: Diagnosis of  By: Linna Darner MD, [place]   Cervical spine    . Testosterone deficiency    Dr Lawerance Bach, [facility]  . Unspecified vitamin D deficiency 08/06/2012  . Urge incontinence 10/09/2012  . Urinary tract infection, site not specified 02/13/2013  . Weakness 08/02/2012   Past Surgical History:  Procedure Laterality Date  . APPENDECTOMY  1941  . BREAST CYST EXCISION Left 01/16/2013   Procedure: MASS EXCISION AXILLA;  Surgeon: Adin Hector, MD;  Location: [facility];  Service: General;  Laterality: Left;  . CAROTID ENDARTERECTOMY  2005    L  . CATARACT EXTRACTION Right   . COLONOSCOPY  2014   Dr. Deatra Ina  . EYE SURGERY Right 02/2012   retina  . INGUINAL HERNIA REPAIR Bilateral 01/16/2013   Procedure: LAPAROSCOPIC BILATERAL INGUINAL DIRECT AND INDIRECT, FEMORAL, AND OBTURATOR HERNIAS;  Surgeon: Adin Hector, MD;  Location: [facility];  Service: General;  Laterality: Bilateral;  . INSERTION OF MESH N/A 01/16/2013   Procedure: INSERTION OF MESH;  Surgeon:  Adin Hector, MD;  Location: [facility];  Service: General;  Laterality: N/A;  . LUMBAR LAMINECTOMY    . TONSILLECTOMY AND ADENOIDECTOMY    . TOTAL HIP ARTHROPLASTY  2005   L    Allergies  Allergen Reactions  . Latex Rash  . Tape Rash    Paper tape is ok to use     Outpatient Encounter Medications as of 10/11/2019  Medication Sig  . acetaminophen (TYLENOL) 500 MG tablet Take 1,000 mg by mouth 3 (three) times daily. And q6hprn for pain/fever  . aspirin 81 MG tablet Take 81 mg by mouth every morning.   Marland Kitchen atorvastatin (LIPITOR) 20 MG tablet Take 20 mg by mouth daily.  . B Complex-C (B-COMPLEX WITH VITAMIN C) tablet Take 1 tablet by mouth daily.  . bimatoprost (LUMIGAN) 0.01 % SOLN Place 1 drop into both eyes at bedtime.  Marland Kitchen buPROPion (WELLBUTRIN XL) 300 MG 24 hr tablet Take 300 mg by mouth daily. per psychiatry recommendation  . Cholecalciferol (  VITAMIN D3) 1000 units CAPS Take 1,000 Units by mouth daily.   . clonazePAM (KLONOPIN) 0.5 MG tablet Take 1 tablet (0.5 mg total) by mouth at bedtime.  Marland Kitchen dextromethorphan-guaiFENesin (ROBAFEN DM CGH/CHEST CONGEST) 10-100 MG/5ML liquid Take 10 mLs by mouth every 6 (six) hours as needed for cough.  . erythromycin ophthalmic ointment Place 1 application into both eyes at bedtime.   Marland Kitchen ketoconazole (NIZORAL) 2 % cream Apply 1 application topically 2 (two) times daily as needed for irritation. Apply to buttocks.  . lactose free nutrition (BOOST) LIQD Take 237 mLs by mouth See admin instructions. 2 BID Between meals, 1 Boost milkshake with QHS snack  . levothyroxine (SYNTHROID) 50 MCG tablet Take 50 mcg by mouth See admin instructions. Take daily except for Sundays 1/2 Tablet = 50mcg.  Marland Kitchen levothyroxine (SYNTHROID, LEVOTHROID) 50 MCG tablet Take 25 mcg by mouth. On Sundays  . linaclotide (LINZESS) 145 MCG CAPS capsule Take 1 capsule (145 mcg total) by mouth daily before breakfast.  . loratadine (CLARITIN) 10 MG tablet Take 10 mg by mouth daily.  . Melatonin  3 MG TABS Take 3 mg by mouth at bedtime.   . mirtazapine (REMERON) 30 MG tablet Take 60 mg by mouth at bedtime.  . Multiple Vitamins-Minerals (ICAPS PO) Take 1 tablet by mouth daily.  Vladimir Faster Glycol-Propyl Glycol (SYSTANE) 0.4-0.3 % SOLN Apply 1 drop to eye 4 (four) times daily.  . polyethylene glycol (MIRALAX / GLYCOLAX) packet Take 17 g by mouth daily. Hold for loose stool  . QUEtiapine (SEROQUEL) 25 MG tablet Take 25 mg by mouth every morning.   Marland Kitchen QUEtiapine (SEROQUEL) 50 MG tablet Take 50 mg by mouth daily.  . sertraline (ZOLOFT) 100 MG tablet Take 200 mg by mouth every morning. In the morning. 200mg  in Total.  . tamsulosin (FLOMAX) 0.4 MG CAPS capsule Take 0.4 mg by mouth daily after supper.   . topiramate (TOPAMAX) 25 MG tablet Take 25 mg by mouth 2 (two) times daily.  Marland Kitchen zinc oxide 20 % ointment Apply 1 application topically as needed for irritation.   No facility-administered encounter medications on file as of 10/11/2019.    Review of Systems  Constitutional: Negative.   HENT: Negative.   Respiratory: Negative.   Cardiovascular: Negative.   Gastrointestinal: Positive for constipation. Negative for abdominal distention and nausea.  Genitourinary: Negative.   Musculoskeletal: Positive for gait problem.  Skin: Positive for wound.  Neurological: Positive for weakness.  Psychiatric/Behavioral: Positive for dysphoric mood.    Immunization History  Administered Date(s) Administered  . DTaP 11/05/2005  . Hepatitis B 1932-10-03  . Influenza Split 11/12/2011, 11/29/2013  . Influenza Whole 10/30/2007, 11/12/2008, 11/26/2009  . Influenza, High Dose Seasonal PF 12/31/2015, 11/24/2016, 11/30/2018  . Influenza,inj,quad, With Preservative 10/16/2016  . Influenza-Unspecified 11/28/2017  . Moderna SARS-COVID-2 Vaccination 02/19/2019, 03/19/2019  . PPD Test 06/23/2011  . Pneumococcal Conjugate-13 01/14/2016  . Pneumococcal Polysaccharide-23 02/16/1999  . Tdap 01/13/2017  . Varicella  11/09/2005  . Zoster 08/30/2006   Pertinent  Health Maintenance Due  Topic Date Due  . INFLUENZA VACCINE  09/16/2019  . PNA vac Low Risk Adult  Completed   Fall Risk  10/28/2017 10/27/2016 06/09/2016 02/03/2016 10/28/2015  Falls in the past year? Yes Yes Yes Yes Yes  Number falls in past yr: 1 2 or more 2 or more 2 or more 2 or more  Comment - - - 2 falls in last 2 months -  Injury with Fall? Yes Yes No Yes No  Comment - L hip - scraps and bruises -  Risk Factor Category  - - - - -  Risk for fall due to : - - - - Impaired balance/gait  Follow up - - - - -   Functional Status Survey:    Vitals:   10/11/19 1505  BP: (!) 155/81  Pulse: 70  Resp: 18  Temp: (!) 97.4 F (36.3 C)  TempSrc: Oral  SpO2: 97%  Weight: 142 lb 9.6 oz (64.7 kg)  Height: 6' (1.829 m)   Body mass index is 19.34 kg/m. Physical Exam Vitals reviewed.  Constitutional:      Appearance: Normal appearance.  HENT:     Head: Normocephalic.     Nose: Nose normal.     Mouth/Throat:     Mouth: Mucous membranes are moist.     Pharynx: Oropharynx is clear.  Eyes:     Pupils: Pupils are equal, round, and reactive to light.  Cardiovascular:     Rate and Rhythm: Normal rate and regular rhythm.  Pulmonary:     Effort: Pulmonary effort is normal.     Breath sounds: Normal breath sounds.  Abdominal:     General: Abdomen is flat. Bowel sounds are normal.     Palpations: Abdomen is soft.  Musculoskeletal:        General: No swelling.     Cervical back: Neck supple.  Skin:    General: Skin is warm.     Comments: Has Stage 2 Pressure wound in Left Heel  Neurological:     General: No focal deficit present.     Mental Status: He is alert.  Psychiatric:        Mood and Affect: Mood normal.        Thought Content: Thought content normal.     Labs reviewed: Recent Labs    04/26/19 0320 04/26/19 0320 04/26/19 0534 04/27/19 0504 08/23/19 0000  NA 143   < > 143 141 142  K 3.7   < > 3.4* 4.4 4.1  CL 109    < > 114* 110 111*  CO2  --   --  23 22 26*  GLUCOSE 115*  --  114* 95  --   BUN 36*   < > 38* 36* 31*  CREATININE 1.10   < > 1.04 1.07 1.0  CALCIUM  --   --  8.2* 8.3* 8.5*   < > = values in this interval not displayed.   Recent Labs    04/26/19 0534 04/27/19 0504 08/23/19 0000  AST 24 22 15   ALT 20 23 12   ALKPHOS 36* 34* 43  BILITOT 0.6 0.6  --   PROT 5.4* 5.4*  --   ALBUMIN 2.8* 2.5* 3.1*   Recent Labs    04/26/19 0128 04/26/19 0320 04/26/19 0534 04/27/19 0504 08/23/19 0000  WBC 3.2*   < > 2.8* 4.3 5.5  NEUTROABS 2.3   < > 1.9 2.6 3,894  HGB 15.5   < > 11.9* 12.2* 13.0*  HCT 50.0   < > 38.4* 38.3* 40*  MCV 96.9  --  97.5 95.0  --   PLT 139*   < > 123* 132* 137*   < > = values in this interval not displayed.   Lab Results  Component Value Date   TSH 0.994 04/26/2019   Lab Results  Component Value Date   HGBA1C 5 01/26/2018   Lab Results  Component Value Date   CHOL 146 11/24/2018  HDL 38 11/24/2018   LDLCALC 89 11/24/2018   LDLDIRECT 132.5 09/08/2012   TRIG 91 11/24/2018   CHOLHDL 2.7 11/15/2016    Significant Diagnostic Results in last 30 days:  No results found.  Assessment/Plan Pressure injury of left heel, stage 2 (HCC) Hydrocolloid Dressing QOD Keep Pressure off  Chronic constipation Increased Linzess to 290 mcg Hallucinations, visual Well controlled on high doses of Seroquel Will Like to taper Seroquel eventually MRI was negative for any acute Changes Just Moderate Atrophy with Chronic Infarcts  Essential hypertension BP slightly high today Will continue to monitor  Other Issues Major psychotic depression, recurrent (HCC) Continues on Zoloft , Remeron,Wellbutrin and Klonipin Weight loss Weight has been stable since Topamax reduced Stage 3 chronic kidney disease, unspecified whether stage 3a or 3b CKD Creatinine stable Hyperlipidemia On Lipitor Hand tremors Doing okay on lower dose of Topamax  Family/ staff Communication:  Daughter in Room  Labs/tests ordered:

## 2019-11-15 ENCOUNTER — Non-Acute Institutional Stay (SKILLED_NURSING_FACILITY): Payer: Medicare Other | Admitting: Internal Medicine

## 2019-11-15 ENCOUNTER — Encounter: Payer: Self-pay | Admitting: Internal Medicine

## 2019-11-15 DIAGNOSIS — E039 Hypothyroidism, unspecified: Secondary | ICD-10-CM

## 2019-11-15 DIAGNOSIS — R441 Visual hallucinations: Secondary | ICD-10-CM | POA: Diagnosis not present

## 2019-11-15 DIAGNOSIS — F333 Major depressive disorder, recurrent, severe with psychotic symptoms: Secondary | ICD-10-CM

## 2019-11-15 DIAGNOSIS — N183 Chronic kidney disease, stage 3 unspecified: Secondary | ICD-10-CM

## 2019-11-15 DIAGNOSIS — I739 Peripheral vascular disease, unspecified: Secondary | ICD-10-CM

## 2019-11-15 DIAGNOSIS — E785 Hyperlipidemia, unspecified: Secondary | ICD-10-CM

## 2019-11-15 NOTE — Progress Notes (Signed)
Location:    [facility] Room Number: 37 Place of Service:  SNF [id number]) Provider:  Veleta Miners MD  [name], MD  Patient Care Team: [name], MD as PCP - General (Internal Medicine) [name], MD as Consulting Physician (Urology) [name], MD as Consulting Physician (Neurology) [name], MD as Consulting Physician (Dermatology) [name], MD (Inactive) as Consulting Physician (Gastroenterology) [name], MD as Consulting Physician (Orthopedic Surgery) [name], MD as Consulting Physician (Cardiology) [name], MD as Consulting Physician (Ophthalmology) [name], MD as Referring Physician (Psychiatry) [name], MD as Consulting Physician (General Surgery) [name], MD as Consulting Physician (Orthopedic Surgery) [place], [name], NP as Nurse Practitioner (Family Medicine) Mast, Man X, NP as Nurse Practitioner (Internal Medicine)  Extended Emergency Contact Information Primary Emergency Contact: [facility] Address: [address]          [place], [place] [address] [place] Phone: [phone] Relation: Spouse Secondary Emergency Contact: Durwin Glaze Mobile Phone: [phone] Relation: Daughter  Code Status:  DNR Goals of care: Advanced Directive information Advanced Directives 11/15/2019  Does Patient Have a Medical Advance Directive? Yes  Type of Advance Directive Out of facility DNR (pink MOST or yellow form);Living will;Healthcare Power of Attorney  Does patient want to make changes to medical advance directive? No - Patient declined  Copy of [place] in Chart? Yes - validated most recent copy scanned in chart (See row information)  Pre-existing out of facility DNR order (yellow form or pink MOST form) Yellow form placed in chart (order not valid for inpatient use);Pink MOST form placed in chart (order not  valid for inpatient use)     Chief Complaint  Patient presents with  . Medical Management of Chronic Issues    HPI:  Pt is a 86 y.o. male seen today for medical management of chronic diseases.    Long term Care resident  He has h/o HTN, Hyperlipidemia, PAD, BPH , CKD stage 2, Hypothyroidism, and Major Depression Also Has h/o Cervial Stenosis with Compressive Myelopathy.  Patient seems to be doing better as his wife has moved in Nursing place and his daughter can come to see him everyday Has Gained weight  And Not having any more Hallucinations right now. Trying to paint now. No New Nursing issues   Past Medical History:  Diagnosis Date  . Acute urinary retention 01/28/2013  . AKI (acute kidney injury) ([place]) 01/26/2013  . Altered mental status 09/09/2013  . Anemia    B12 deficiency  . ANXIETY 06/07/2007   Qualifier: Diagnosis of  By: Talbert Cage CMA ([facility]), June    . ARTHRITIS 06/07/2007   Qualifier: Diagnosis of  By: Talbert Cage CMA ([place]), June    . Balance problems 04/05/2011  . Benign essential tremor   . Benign fibroma of prostate 10/11/2011  . Bilateral inguinal hernia 10/09/2012  . Bilateral leg edema 09/13/2012  . Bladder retention 02/05/2013  . Bradycardia, sinus 08/02/2012  . CAROTID ARTERY DISEASE 03/31/2007   Qualifier: Diagnosis of  By: Linna Darner MD, Gwyndolyn Saxon    . Carotid stenosis    s/p L CEA  . Cataract, nuclear 03/21/2014  . 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 Cervical spinal stenosis   . Cervical stenosis of spine   . Critical lower limb ischemia   . Degeneration macular 11/02/2011  . Depression    Dr Albertine Patricia  .  Difficulty in walking(719.7) 04/05/2011  . ESOPHAGITIS 08/06/2002   Qualifier: Diagnosis of  By: Talbert Cage CMA ([place]), June    . Essential and other specified forms of tremor 04/18/2013  . Fall at home 01/06/2017   left hip pain  . Falls   . Family history of colon cancer 07/24/2012  . Fecal impaction ([place]) 10/11/2012  . Femoral hernia, bilateral s/p lap repair  01/16/2013 01/16/2013  . Glaucoma, compensated 03/21/2014  . H/O cardiovascular stress test    a. 02/2003 -> no ischemia/infarct.  Marland Kitchen HAMMER TOE 04/23/2010   Qualifier: Diagnosis of  By: Linna Darner MD, Gwyndolyn Saxon    . Hematoma of leg   . Hip hematoma, right 09/09/2013  . History of shingles 2009  . HTN (hypertension) 08/22/2012  . Hx of echocardiogram    Echocardiogram 08/01/12: Mild LVH, EF 55-60%, normal wall motion.  . Hydrocele 10/11/2011  . Hyperlipidemia   . 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 Hypothyroidism   . Macular degeneration disease   . Macular edema   . Migraines    Dr Jannifer Franklin  . MORTON'S NEUROMA, LEFT 08/22/2007   Qualifier: Diagnosis of  By: Linna Darner MD, Gwyndolyn Saxon    . PAD (peripheral artery disease) ([place])   . Peripheral neuropathy   . Personal history of colonic polyps 01/05/2013   2014 2 mm adenomatous polyp   . PREMATURE ATRIAL CONTRACTIONS 03/14/2006   Qualifier: Diagnosis of  By: Linna Darner MD, Gwendolyn Lima 12/16/2011  . SPINAL STENOSIS 03/14/2006   Qualifier: Diagnosis of  By: Linna Darner MD, William   Cervical spine    . Testosterone deficiency    Dr Lawerance Bach, [facility]  . Unspecified vitamin D deficiency 08/06/2012  . Urge incontinence 10/09/2012  . Urinary tract infection, site not specified 02/13/2013  . Weakness 08/02/2012   Past Surgical History:  Procedure Laterality Date  . APPENDECTOMY  1941  . BREAST CYST EXCISION Left 01/16/2013   Procedure: MASS EXCISION AXILLA;  Surgeon: Adin Hector, MD;  Location: [facility];  Service: General;  Laterality: Left;  . CAROTID ENDARTERECTOMY  2005    L  . CATARACT EXTRACTION Right   . COLONOSCOPY  2014   Dr. Deatra Ina  . EYE SURGERY Right 02/2012   retina  . INGUINAL HERNIA REPAIR Bilateral 01/16/2013   Procedure: LAPAROSCOPIC BILATERAL INGUINAL DIRECT AND INDIRECT, FEMORAL, AND OBTURATOR HERNIAS;  Surgeon: Adin Hector, MD;  Location: [facility];  Service: General;  Laterality: Bilateral;  . INSERTION OF MESH N/A 01/16/2013    Procedure: INSERTION OF MESH;  Surgeon: Adin Hector, MD;  Location: [facility];  Service: General;  Laterality: N/A;  . LUMBAR LAMINECTOMY    . TONSILLECTOMY AND ADENOIDECTOMY    . TOTAL HIP ARTHROPLASTY  2005   L    Allergies  Allergen Reactions  . Latex Rash  . Tape Rash    Paper tape is ok to use     Allergies as of 11/15/2019      Reactions   Latex Rash   Tape Rash   Paper tape is ok to use      Medication List       Accurate as of November 15, 2019  9:51 AM. If you have any questions, ask your nurse or doctor.        acetaminophen 500 MG tablet Commonly known as: TYLENOL Take 1,000 mg by mouth 3 (three) times daily. And q6hprn for pain/fever   aspirin 81 MG tablet Take 81 mg by mouth  every morning.   atorvastatin 20 MG tablet Commonly known as: LIPITOR Take 20 mg by mouth daily.   B-complex with vitamin C tablet Take 1 tablet by mouth daily.   bimatoprost 0.01 % Soln Commonly known as: LUMIGAN Place 1 drop into both eyes at bedtime.   buPROPion 300 MG 24 hr tablet Commonly known as: WELLBUTRIN XL Take 300 mg by mouth daily. per psychiatry recommendation   clonazePAM 0.5 MG tablet Commonly known as: KLONOPIN Take 1 tablet (0.5 mg total) by mouth at bedtime.   erythromycin ophthalmic ointment Place 1 application into both eyes at bedtime.   ICAPS PO Take 1 tablet by mouth daily.   ketoconazole 2 % cream Commonly known as: NIZORAL Apply 1 application topically 2 (two) times daily as needed for irritation. Apply to buttocks.   lactose free nutrition Liqd Take 237 mLs by mouth See admin instructions. 2 BID Between meals, 1 Boost milkshake with QHS snack   levothyroxine 50 MCG tablet Commonly known as: SYNTHROID Take 25 mcg by mouth. On Sundays   levothyroxine 50 MCG tablet Commonly known as: SYNTHROID Take 50 mcg by mouth See admin instructions. Take daily except for Sundays 1/2 Tablet = 25mcg.   linaclotide 145 MCG Caps capsule Commonly  known as: LINZESS Take 290 mcg by mouth daily before breakfast. What changed: Another medication with the same name was removed. Continue taking this medication, and follow the directions you see here. Changed by: [name], MD   loratadine 10 MG tablet Commonly known as: CLARITIN Take 10 mg by mouth daily.   melatonin 3 MG Tabs tablet Take 3 mg by mouth at bedtime.   mirtazapine 30 MG tablet Commonly known as: REMERON Take 60 mg by mouth at bedtime.   polyethylene glycol 17 g packet Commonly known as: MIRALAX / GLYCOLAX Take 17 g by mouth daily. Hold for loose stool   QUEtiapine 25 MG tablet Commonly known as: SEROQUEL Take 25 mg by mouth every morning.   QUEtiapine 50 MG tablet Commonly known as: SEROQUEL Take 50 mg by mouth daily.   Robafen DM Cgh/Chest Congest 10-100 MG/5ML liquid Generic drug: dextromethorphan-guaiFENesin Take 10 mLs by mouth every 6 (six) hours as needed for cough.   sertraline 100 MG tablet Commonly known as: ZOLOFT Take 200 mg by mouth every morning. In the morning. 200mg  in Total.   Systane 0.4-0.3 % Soln Generic drug: Polyethyl Glycol-Propyl Glycol Apply 1 drop to eye 4 (four) times daily.   tamsulosin 0.4 MG Caps capsule Commonly known as: FLOMAX Take 0.4 mg by mouth daily after supper.   topiramate 25 MG tablet Commonly known as: TOPAMAX Take 25 mg by mouth 2 (two) times daily.   Vitamin D3 25 MCG (1000 UT) Caps Take 1,000 Units by mouth daily.   zinc oxide 20 % ointment Apply 1 application topically as needed for irritation.       Review of Systems  Constitutional: Negative.   HENT: Negative.   Respiratory: Negative.   Cardiovascular: Negative.   Gastrointestinal: Negative.   Genitourinary: Negative.   Musculoskeletal: Positive for gait problem.  Neurological: Positive for weakness.  Psychiatric/Behavioral: Positive for dysphoric mood.    Immunization History  Administered Date(s) Administered  . DTaP 11/05/2005   . Hepatitis B 1932-10-16  . Influenza Split 11/12/2011, 11/29/2013  . Influenza Whole 10/30/2007, 11/12/2008, 11/26/2009  . Influenza, High Dose Seasonal PF 12/31/2015, 11/24/2016, 11/30/2018  . Influenza,inj,quad, With Preservative 10/16/2016  . Influenza-Unspecified 11/28/2017  . [place] SARS-COVID-2 Vaccination  02/19/2019, 03/19/2019  . PPD Test 06/23/2011  . Pneumococcal Conjugate-13 01/14/2016  . Pneumococcal Polysaccharide-23 02/16/1999  . Tdap 01/13/2017  . Varicella 11/09/2005  . Zoster 08/30/2006   Pertinent  Health Maintenance Due  Topic Date Due  . INFLUENZA VACCINE  09/16/2019  . PNA vac Low Risk Adult  Completed   Fall Risk  10/28/2017 10/27/2016 06/09/2016 02/03/2016 10/28/2015  Falls in the past year? Yes Yes Yes Yes Yes  Number falls in past yr: 1 2 or more 2 or more 2 or more 2 or more  Comment - - - 2 falls in last 2 months -  Injury with Fall? Yes Yes No Yes No  Comment - L hip - scraps and bruises -  Risk Factor Category  - - - - -  Risk for fall due to : - - - - Impaired balance/gait  Follow up - - - - -   Functional Status Survey:    Vitals:   11/15/19 0936  BP: (!) 110/54  Pulse: 61  Resp: 18  Temp: 97.6 F (36.4 C)  SpO2: 97%  Weight: 143 lb 1.6 oz (64.9 kg)  Height: 6' (1.829 m)   Body mass index is 19.41 kg/m. Physical Exam Vitals reviewed.  Constitutional:      Appearance: Normal appearance.  HENT:     Head: Normocephalic.     Nose: Nose normal.     Mouth/Throat:     Mouth: Mucous membranes are moist.     Pharynx: Oropharynx is clear.  Eyes:     Pupils: Pupils are equal, round, and reactive to light.  Cardiovascular:     Rate and Rhythm: Normal rate and regular rhythm.  Pulmonary:     Effort: Pulmonary effort is normal.     Breath sounds: Normal breath sounds.  Abdominal:     General: Abdomen is flat. Bowel sounds are normal.     Palpations: Abdomen is soft.  Musculoskeletal:        General: No swelling.     Cervical back:  Neck supple.  Skin:    General: Skin is warm.  Neurological:     General: No focal deficit present.     Mental Status: He is alert and oriented to person, place, and time.  Psychiatric:        Mood and Affect: Mood normal.        Thought Content: Thought content normal.     Labs reviewed: Recent Labs    04/26/19 0320 04/26/19 0320 04/26/19 0534 04/27/19 0504 08/23/19 0000  NA 143   < > 143 141 142  K 3.7   < > 3.4* 4.4 4.1  CL 109   < > 114* 110 111*  CO2  --   --  23 22 26*  GLUCOSE 115*  --  114* 95  --   BUN 36*   < > 38* 36* 31*  CREATININE 1.10   < > 1.04 1.07 1.0  CALCIUM  --   --  8.2* 8.3* 8.5*   < > = values in this interval not displayed.   Recent Labs    04/26/19 0534 04/27/19 0504 08/23/19 0000  AST 24 22 15   ALT 20 23 12   ALKPHOS 36* 34* 43  BILITOT 0.6 0.6  --   PROT 5.4* 5.4*  --   ALBUMIN 2.8* 2.5* 3.1*   Recent Labs    04/26/19 0128 04/26/19 0320 04/26/19 0534 04/27/19 0504 08/23/19 0000  WBC 3.2*   < >  2.8* 4.3 5.5  NEUTROABS 2.3   < > 1.9 2.6 3,894  HGB 15.5   < > 11.9* 12.2* 13.0*  HCT 50.0   < > 38.4* 38.3* 40*  MCV 96.9  --  97.5 95.0  --   PLT 139*   < > 123* 132* 137*   < > = values in this interval not displayed.   Lab Results  Component Value Date   TSH 0.994 04/26/2019   Lab Results  Component Value Date   HGBA1C 5 01/26/2018   Lab Results  Component Value Date   CHOL 146 11/24/2018   HDL 38 11/24/2018   LDLCALC 89 11/24/2018   LDLDIRECT 132.5 09/08/2012   TRIG 91 11/24/2018   CHOLHDL 2.7 11/15/2016    Significant Diagnostic Results in last 30 days:  No results found.  Assessment/Plan Hallucinations, visual MRI Negative Doing well on High dose of Seroquel Major psychotic depression, recurrent (HCC) On High Dose of Zoloft and Remeron Follows with Psychologist Acquired hypothyroidism Repeat TSH PAD (peripheral artery disease) ([place]) Had arterial studies done in the facility which showed moderate disease  bilateral Not a candidate for any extensive work-up Is asymptomatic at this time Continue Supportive care Stage 3 chronic kidney disease, unspecified whether stage 3a or 3b CKD (HCC) Creat Stable Repeat BMP Hyperlipidemia LDL goal <70 On Lipitor Needs Fasting Lipid Constipation On Linzess Hand Tremors On Lower dose of Topamax  Family/ staff Communication:   Labs/tests ordered:  CBC<CMP,TSH, Lipid Panel

## 2019-11-19 LAB — BASIC METABOLIC PANEL
BUN: 34 — AB (ref 4–21)
CO2: 24 — AB (ref 13–22)
Chloride: 111 — AB (ref 99–108)
Creatinine: 1.2 (ref 0.6–1.3)
Glucose: 80
Potassium: 4.1 (ref 3.4–5.3)
Sodium: 142 (ref 137–147)

## 2019-11-19 LAB — LIPID PANEL
Cholesterol: 136 (ref 0–200)
HDL: 42 (ref 35–70)
LDL Cholesterol: 78
LDl/HDL Ratio: 3.2
Triglycerides: 79 (ref 40–160)

## 2019-11-19 LAB — CBC: RBC: 4.13 (ref 3.87–5.11)

## 2019-11-19 LAB — CBC AND DIFFERENTIAL
HCT: 39 — AB (ref 41–53)
Hemoglobin: 12.9 — AB (ref 13.5–17.5)
Platelets: 135 — AB (ref 150–399)
WBC: 4.7

## 2019-11-19 LAB — HEPATIC FUNCTION PANEL
ALT: 12 (ref 10–40)
AST: 17 (ref 14–40)
Alkaline Phosphatase: 43 (ref 25–125)
Bilirubin, Total: 0.3

## 2019-11-19 LAB — TSH: TSH: 2.04 (ref 0.41–5.90)

## 2019-11-19 LAB — COMPREHENSIVE METABOLIC PANEL: Calcium: 8.8 (ref 8.7–10.7)

## 2019-12-03 ENCOUNTER — Non-Acute Institutional Stay (SKILLED_NURSING_FACILITY): Payer: Medicare Other | Admitting: Nurse Practitioner

## 2019-12-03 ENCOUNTER — Encounter: Payer: Self-pay | Admitting: Nurse Practitioner

## 2019-12-03 DIAGNOSIS — I739 Peripheral vascular disease, unspecified: Secondary | ICD-10-CM

## 2019-12-03 DIAGNOSIS — N138 Other obstructive and reflux uropathy: Secondary | ICD-10-CM

## 2019-12-03 DIAGNOSIS — N401 Enlarged prostate with lower urinary tract symptoms: Secondary | ICD-10-CM

## 2019-12-03 DIAGNOSIS — E039 Hypothyroidism, unspecified: Secondary | ICD-10-CM

## 2019-12-03 DIAGNOSIS — J309 Allergic rhinitis, unspecified: Secondary | ICD-10-CM | POA: Diagnosis not present

## 2019-12-03 DIAGNOSIS — G250 Essential tremor: Secondary | ICD-10-CM | POA: Diagnosis not present

## 2019-12-03 DIAGNOSIS — F333 Major depressive disorder, recurrent, severe with psychotic symptoms: Secondary | ICD-10-CM

## 2019-12-03 DIAGNOSIS — K5909 Other constipation: Secondary | ICD-10-CM

## 2019-12-03 DIAGNOSIS — M159 Polyosteoarthritis, unspecified: Secondary | ICD-10-CM

## 2019-12-03 NOTE — Assessment & Plan Note (Signed)
PAD, moderate disease bilaterally per ABT 04/25/19, no open wounds.

## 2019-12-03 NOTE — Assessment & Plan Note (Signed)
Hx of OA, stable, on Tylenol 1000mg  tid.

## 2019-12-03 NOTE — Assessment & Plan Note (Signed)
Essential tremor, on Topamax BID.

## 2019-12-03 NOTE — Assessment & Plan Note (Signed)
Psychotic featured depression/anxiety/delusion/hallucinatio,takesSertralien200mg  qd, Mirtazapine 60mg  qd, Clonazepam 0.5mg  qhs, Bupropion 300mg  qd, Abilifyoff, Quetiapine 25mg  qam, 50mg  qd, MRI brainunremarkable.

## 2019-12-03 NOTE — Assessment & Plan Note (Addendum)
Hypothyroidism, stable, on Levothyroxine 38mcg qd 6x/wk, 83mcg qd 1x/wk. TSH 2.04 11/19/19

## 2019-12-03 NOTE — Assessment & Plan Note (Signed)
Rhinitis, takes Loratadine 10mg  qd.

## 2019-12-03 NOTE — Assessment & Plan Note (Signed)
BPH, stable, on Tamsulosin.

## 2019-12-03 NOTE — Progress Notes (Signed)
Location:    [facility] Room Number: 37 Place of Service:  SNF (31) Provider: Lennie Odor Leo Fray NP  [name], MD  Patient Care Team: [name], MD as PCP - General (Internal Medicine) [name], MD as Consulting Physician (Urology) [name], MD as Consulting Physician (Neurology) [name], MD as Consulting Physician (Dermatology) [name], MD (Inactive) as Consulting Physician (Gastroenterology) [name], MD as Consulting Physician (Orthopedic Surgery) [name], MD as Consulting Physician (Cardiology) [name], MD as Consulting Physician (Ophthalmology) [name], MD as Referring Physician (Psychiatry) [name], MD as Consulting Physician (General Surgery) [name], MD as Consulting Physician (Orthopedic Surgery) [place], [name], NP as Nurse Practitioner (Family Medicine) Harshan Kearley X, NP as Nurse Practitioner (Internal Medicine)  Extended Emergency Contact Information Primary Emergency Contact: Ceasar,[name] Address: [address]          [place]          [place], [place] [address] [place] Phone: [phone] Relation: Spouse Secondary Emergency Contact: Durwin Glaze Mobile Phone: [phone] Relation: Daughter  Code Status:  DNR Goals of care: Advanced Directive information Advanced Directives 11/15/2019  Does Patient Have a Medical Advance Directive? Yes  Type of Advance Directive Out of facility DNR (pink MOST or yellow form);Living will;Healthcare Power of Attorney  Does patient want to make changes to medical advance directive? No - Patient declined  Copy of [place] in Chart? Yes - validated most recent copy scanned in chart (See row information)  Pre-existing out of facility DNR order (yellow form or pink MOST form) Yellow form placed in chart (order not valid for inpatient use);Pink MOST form placed in chart (order not valid  for inpatient use)     Chief Complaint  Patient presents with  . Medical Management of Chronic Issues    HPI:  Pt is a 86 y.o. male seen today for medical management of chronic diseases.    Psychotic featured depression/anxiety/delusion/hallucinatio,takesSertralien200mg  qd, Mirtazapine 60mg  qd, Clonazepam 0.5mg  qhs, Bupropion 300mg  qd, Abilifyoff, Quetiapine 25mg  qam, 50mg  qd, MRI brainunremarkable. Hx of OA, stable, on Tylenol 1000mg  tid. Hypothyroidism, stable, on Levothyroxine 57mcg qd 6x/wk, 86mcg qd 1x/wk. TSH 2.04 11/19/19 Constipation, stable, on Linzess qd, MiraLax qd.  BPH, stable, on Tamsulosin.  Essential tremor, on Topamax BID.  Rhinitis, takes Loratadine 10mg  qd.  PAD, moderate disease bilaterally    Past Medical History:  Diagnosis Date  . Acute urinary retention 01/28/2013  . AKI (acute kidney injury) ([place]) 01/26/2013  . Altered mental status 09/09/2013  . Anemia    B12 deficiency  . ANXIETY 06/07/2007   Qualifier: Diagnosis of  By: Talbert Cage CMA ([place]), June    . ARTHRITIS 06/07/2007   Qualifier: Diagnosis of  By: Talbert Cage CMA ([place]), June    . Balance problems 04/05/2011  . Benign essential tremor   . Benign fibroma of prostate 10/11/2011  . Bilateral inguinal hernia 10/09/2012  . Bilateral leg edema 09/13/2012  . Bladder retention 02/05/2013  . Bradycardia, sinus 08/02/2012  . CAROTID ARTERY DISEASE 03/31/2007   Qualifier: Diagnosis of  By: Linna Darner MD, Gwyndolyn Saxon    . Carotid stenosis    s/p L CEA  . Cataract, nuclear 03/21/2014  . 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 Cervical spinal stenosis   . Cervical stenosis of spine   . Critical lower limb ischemia ([place])   . Degeneration macular 11/02/2011  . Depression    Dr Albertine Patricia  .  Difficulty in walking(719.7) 04/05/2011  . ESOPHAGITIS 08/06/2002   Qualifier: Diagnosis of  By: Talbert Cage CMA ([place]), June    . Essential and other specified  forms of tremor 04/18/2013  . Fall at home 01/06/2017   left hip pain  . Falls   . Family history of colon cancer 07/24/2012  . Fecal impaction ([place]) 10/11/2012  . Femoral hernia, bilateral s/p lap repair 01/16/2013 01/16/2013  . Glaucoma, compensated 03/21/2014  . H/O cardiovascular stress test    a. 02/2003 -> no ischemia/infarct.  Marland Kitchen HAMMER TOE 04/23/2010   Qualifier: Diagnosis of  By: Linna Darner MD, Gwyndolyn Saxon    . Hematoma of leg   . Hip hematoma, right 09/09/2013  . History of shingles 2009  . HTN (hypertension) 08/22/2012  . Hx of echocardiogram    Echocardiogram 08/01/12: Mild LVH, EF 55-60%, normal wall motion.  . Hydrocele 10/11/2011  . Hyperlipidemia   . 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 Hypothyroidism   . Macular degeneration disease   . Macular edema   . Migraines    Dr Jannifer Franklin  . MORTON'S NEUROMA, LEFT 08/22/2007   Qualifier: Diagnosis of  By: Linna Darner MD, Gwyndolyn Saxon    . PAD (peripheral artery disease) ([place])   . Peripheral neuropathy   . Personal history of colonic polyps 01/05/2013   2014 2 mm adenomatous polyp   . PREMATURE ATRIAL CONTRACTIONS 03/14/2006   Qualifier: Diagnosis of  By: Linna Darner MD, Gwendolyn Lima 12/16/2011  . SPINAL STENOSIS 03/14/2006   Qualifier: Diagnosis of  By: Linna Darner MD, William   Cervical spine    . Testosterone deficiency    Dr Lawerance Bach, [facility]  . Unspecified vitamin D deficiency 08/06/2012  . Urge incontinence 10/09/2012  . Urinary tract infection, site not specified 02/13/2013  . Weakness 08/02/2012   Past Surgical History:  Procedure Laterality Date  . APPENDECTOMY  1941  . BREAST CYST EXCISION Left 01/16/2013   Procedure: MASS EXCISION AXILLA;  Surgeon: Adin Hector, MD;  Location: [facility];  Service: General;  Laterality: Left;  . CAROTID ENDARTERECTOMY  2005    L  . CATARACT EXTRACTION Right   . COLONOSCOPY  2014   Dr. Deatra Ina  . EYE SURGERY Right 02/2012   retina  . INGUINAL HERNIA REPAIR Bilateral 01/16/2013   Procedure: LAPAROSCOPIC  BILATERAL INGUINAL DIRECT AND INDIRECT, FEMORAL, AND OBTURATOR HERNIAS;  Surgeon: Adin Hector, MD;  Location: [facility];  Service: General;  Laterality: Bilateral;  . INSERTION OF MESH N/A 01/16/2013   Procedure: INSERTION OF MESH;  Surgeon: Adin Hector, MD;  Location: [facility];  Service: General;  Laterality: N/A;  . LUMBAR LAMINECTOMY    . TONSILLECTOMY AND ADENOIDECTOMY    . TOTAL HIP ARTHROPLASTY  2005   L    Allergies  Allergen Reactions  . Latex Rash  . Tape Rash    Paper tape is ok to use     Allergies as of 12/03/2019      Reactions   Latex Rash   Tape Rash   Paper tape is ok to use      Medication List       Accurate as of December 03, 2019 11:59 PM. If you have any questions, ask your nurse or doctor.        acetaminophen 500 MG tablet Commonly known as: TYLENOL Take 1,000 mg by mouth 3 (three) times daily. And q6hprn for pain/fever   aspirin 81 MG tablet Take 81 mg by mouth every  morning.   atorvastatin 20 MG tablet Commonly known as: LIPITOR Take 20 mg by mouth daily.   B-complex with vitamin C tablet Take 1 tablet by mouth daily.   bimatoprost 0.01 % Soln Commonly known as: LUMIGAN Place 1 drop into both eyes at bedtime.   buPROPion 300 MG 24 hr tablet Commonly known as: WELLBUTRIN XL Take 300 mg by mouth daily. per psychiatry recommendation   clonazePAM 0.5 MG tablet Commonly known as: KLONOPIN Take 1 tablet (0.5 mg total) by mouth at bedtime.   erythromycin ophthalmic ointment Place 1 application into both eyes at bedtime.   ICAPS PO Take 1 tablet by mouth daily.   ketoconazole 2 % cream Commonly known as: NIZORAL Apply 1 application topically 2 (two) times daily as needed for irritation. Apply to buttocks.   lactose free nutrition Liqd Take 237 mLs by mouth See admin instructions. 2 BID Between meals, 1 Boost milkshake with QHS snack   levothyroxine 50 MCG tablet Commonly known as: SYNTHROID Take 25 mcg by mouth. On Sundays     levothyroxine 50 MCG tablet Commonly known as: SYNTHROID Take 50 mcg by mouth See admin instructions. Take daily except for Sundays 1/2 Tablet = 62mcg.   linaclotide 145 MCG Caps capsule Commonly known as: LINZESS Take 290 mcg by mouth daily before breakfast.   loratadine 10 MG tablet Commonly known as: CLARITIN Take 10 mg by mouth daily.   melatonin 3 MG Tabs tablet Take 3 mg by mouth at bedtime.   mirtazapine 30 MG tablet Commonly known as: REMERON Take 60 mg by mouth at bedtime.   polyethylene glycol 17 g packet Commonly known as: MIRALAX / GLYCOLAX Take 17 g by mouth daily. Hold for loose stool   QUEtiapine 25 MG tablet Commonly known as: SEROQUEL Take 25 mg by mouth every morning.   QUEtiapine 50 MG tablet Commonly known as: SEROQUEL Take 50 mg by mouth daily.   Robafen DM Cgh/Chest Congest 10-100 MG/5ML liquid Generic drug: dextromethorphan-guaiFENesin Take 10 mLs by mouth every 6 (six) hours as needed for cough.   sertraline 100 MG tablet Commonly known as: ZOLOFT Take 200 mg by mouth every morning. In the morning. 200mg  in Total.   Systane 0.4-0.3 % Soln Generic drug: Polyethyl Glycol-Propyl Glycol Apply 1 drop to eye 4 (four) times daily.   tamsulosin 0.4 MG Caps capsule Commonly known as: FLOMAX Take 0.4 mg by mouth daily after supper.   topiramate 25 MG tablet Commonly known as: TOPAMAX Take 25 mg by mouth 2 (two) times daily.   Vitamin D3 25 MCG (1000 UT) Caps Take 1,000 Units by mouth daily.   zinc oxide 20 % ointment Apply 1 application topically as needed for irritation.       Review of Systems  Constitutional: Negative for activity change, fever and unexpected weight change.  HENT: Positive for hearing loss. Negative for congestion and voice change.   Eyes: Positive for visual disturbance.  Respiratory: Negative for cough and shortness of breath.   Cardiovascular: Negative for leg swelling.  Gastrointestinal: Negative for abdominal  pain and constipation.  Genitourinary: Negative for difficulty urinating and dysuria.       Incontinent of urine.   Musculoskeletal: Positive for arthralgias and gait problem.  Skin: Negative for color change.  Neurological: Positive for tremors. Negative for dizziness, speech difficulty and headaches.       Memory lapses. Fine tremor in fingers.   Psychiatric/Behavioral: Positive for hallucinations. Negative for behavioral problems and sleep disturbance. The  patient is nervous/anxious.        Delusions, anxiety, hallucination improved    Immunization History  Administered Date(s) Administered  . DTaP 11/05/2005  . Hepatitis B 25-Jul-1932  . Influenza Split 11/12/2011, 11/29/2013  . Influenza Whole 10/30/2007, 11/12/2008, 11/26/2009  . Influenza, High Dose Seasonal PF 12/31/2015, 11/24/2016, 11/30/2018  . Influenza,inj,quad, With Preservative 10/16/2016  . Influenza-Unspecified 11/28/2017, 11/20/2019  . Moderna SARS-COVID-2 Vaccination 02/19/2019, 03/19/2019  . PPD Test 06/23/2011  . Pneumococcal Conjugate-13 01/14/2016  . Pneumococcal Polysaccharide-23 02/16/1999  . Tdap 01/13/2017  . Varicella 11/09/2005  . Zoster 08/30/2006   Pertinent  Health Maintenance Due  Topic Date Due  . INFLUENZA VACCINE  Completed  . PNA vac Low Risk Adult  Completed   Fall Risk  10/28/2017 10/27/2016 06/09/2016 02/03/2016 10/28/2015  Falls in the past year? Yes Yes Yes Yes Yes  Number falls in past yr: 1 2 or more 2 or more 2 or more 2 or more  Comment - - - 2 falls in last 2 months -  Injury with Fall? Yes Yes No Yes No  Comment - L hip - scraps and bruises -  Risk Factor Category  - - - - -  Risk for fall due to : - - - - Impaired balance/gait  Follow up - - - - -   Functional Status Survey:    Vitals:   12/03/19 1626  BP: 133/70  Pulse: 64  Resp: 18  Temp: (!) 97.4 F (36.3 C)  SpO2: 96%  Weight: 144 lb 1.6 oz (65.4 kg)  Height: 6' (1.829 m)   Body mass index is 19.54  kg/m. Physical Exam Vitals and nursing note reviewed.  Constitutional:      Appearance: Normal appearance.  HENT:     Head: Normocephalic and atraumatic.     Mouth/Throat:     Mouth: Mucous membranes are moist.  Eyes:     Extraocular Movements: Extraocular movements intact.     Conjunctiva/sclera: Conjunctivae normal.     Pupils: Pupils are equal, round, and reactive to light.     Comments: Left eye blind.   Cardiovascular:     Rate and Rhythm: Normal rate and regular rhythm.     Heart sounds: No murmur heard.   Pulmonary:     Effort: Pulmonary effort is normal.     Breath sounds: No rales.  Abdominal:     General: Bowel sounds are normal.     Palpations: Abdomen is soft.     Tenderness: There is no abdominal tenderness.  Musculoskeletal:     Cervical back: Normal range of motion and neck supple.     Right lower leg: No edema.     Left lower leg: No edema.  Skin:    General: Skin is warm and dry.  Neurological:     General: No focal deficit present.     Mental Status: He is alert. Mental status is at baseline.     Gait: Gait abnormal.     Comments: Oriented to person, place. Fine, mild resting tremor in fingers, not disabling.   Psychiatric:        Mood and Affect: Mood normal.        Behavior: Behavior normal.     Comments: Less hallucination, delusion     Labs reviewed: Recent Labs    04/26/19 0320 04/26/19 0320 04/26/19 0534 04/26/19 0534 04/27/19 0504 08/23/19 0000 11/19/19 0000  NA 143   < > 143   < > 141 142  142  K 3.7   < > 3.4*   < > 4.4 4.1 4.1  CL 109   < > 114*   < > 110 111* 111*  CO2  --   --  23   < > 22 26* 24*  GLUCOSE 115*  --  114*  --  95  --   --   BUN 36*   < > 38*   < > 36* 31* 34*  CREATININE 1.10   < > 1.04   < > 1.07 1.0 1.2  CALCIUM  --   --  8.2*   < > 8.3* 8.5* 8.8   < > = values in this interval not displayed.   Recent Labs    04/26/19 0534 04/26/19 0534 04/27/19 0504 08/23/19 0000 11/19/19 0000  AST 24   < > 22 15 17    ALT 20   < > 23 12 12   ALKPHOS 36*   < > 34* 43 43  BILITOT 0.6  --  0.6  --   --   PROT 5.4*  --  5.4*  --   --   ALBUMIN 2.8*  --  2.5* 3.1*  --    < > = values in this interval not displayed.   Recent Labs    04/26/19 0128 04/26/19 0320 04/26/19 0534 04/26/19 0534 04/27/19 0504 08/23/19 0000 11/19/19 0000  WBC 3.2*   < > 2.8*   < > 4.3 5.5 4.7  NEUTROABS 2.3   < > 1.9  --  2.6 3,894  --   HGB 15.5   < > 11.9*   < > 12.2* 13.0* 12.9*  HCT 50.0   < > 38.4*   < > 38.3* 40* 39*  MCV 96.9  --  97.5  --  95.0  --   --   PLT 139*   < > 123*   < > 132* 137* 135*   < > = values in this interval not displayed.   Lab Results  Component Value Date   TSH 2.04 11/19/2019   Lab Results  Component Value Date   HGBA1C 5 01/26/2018   Lab Results  Component Value Date   CHOL 136 11/19/2019   HDL 42 11/19/2019   LDLCALC 78 11/19/2019   LDLDIRECT 132.5 09/08/2012   TRIG 79 11/19/2019   CHOLHDL 2.7 11/15/2016    Significant Diagnostic Results in last 30 days:  No results found.  Assessment/Plan PAD (peripheral artery disease) (HCC) PAD, moderate disease bilaterally per ABT 04/25/19, no open wounds.   Allergic rhinitis Rhinitis, takes Loratadine 10mg  qd.   Benign essential tremor Essential tremor, on Topamax BID.    Benign prostatic hyperplasia with urinary obstruction BPH, stable, on Tamsulosin.    Chronic constipation Constipation, stable, on Linzess qd, MiraLax qd.   Hypothyroidism Hypothyroidism, stable, on Levothyroxine 53mcg qd 6x/wk, 61mcg qd 1x/wk. TSH 2.04 11/19/19   Generalized osteoarthritis of multiple sites Hx of OA, stable, on Tylenol 1000mg  tid.   Major psychotic depression, recurrent ([place]) Psychotic featured depression/anxiety/delusion/hallucinatio,takesSertralien200mg  qd, Mirtazapine 60mg  qd, Clonazepam 0.5mg  qhs, Bupropion 300mg  qd, Abilifyoff, Quetiapine 25mg  qam, 50mg  qd, MRI brainunremarkable.    Family/ staff Communication: plan  of care reviewed with the patient and charge nurse.   Labs/tests ordered: none  Time spend 35 minutes.

## 2019-12-03 NOTE — Assessment & Plan Note (Signed)
Constipation, stable, on Linzess qd, MiraLax qd.

## 2019-12-04 ENCOUNTER — Encounter: Payer: Self-pay | Admitting: Nurse Practitioner

## 2019-12-10 ENCOUNTER — Encounter: Payer: Self-pay | Admitting: Nurse Practitioner

## 2019-12-10 ENCOUNTER — Non-Acute Institutional Stay (SKILLED_NURSING_FACILITY): Payer: Medicare Other | Admitting: Nurse Practitioner

## 2019-12-10 DIAGNOSIS — E039 Hypothyroidism, unspecified: Secondary | ICD-10-CM

## 2019-12-10 DIAGNOSIS — N138 Other obstructive and reflux uropathy: Secondary | ICD-10-CM

## 2019-12-10 DIAGNOSIS — R296 Repeated falls: Secondary | ICD-10-CM

## 2019-12-10 DIAGNOSIS — J309 Allergic rhinitis, unspecified: Secondary | ICD-10-CM

## 2019-12-10 DIAGNOSIS — M159 Polyosteoarthritis, unspecified: Secondary | ICD-10-CM

## 2019-12-10 DIAGNOSIS — N401 Enlarged prostate with lower urinary tract symptoms: Secondary | ICD-10-CM

## 2019-12-10 DIAGNOSIS — K5909 Other constipation: Secondary | ICD-10-CM

## 2019-12-10 DIAGNOSIS — I739 Peripheral vascular disease, unspecified: Secondary | ICD-10-CM | POA: Diagnosis not present

## 2019-12-10 DIAGNOSIS — G250 Essential tremor: Secondary | ICD-10-CM

## 2019-12-10 DIAGNOSIS — F333 Major depressive disorder, recurrent, severe with psychotic symptoms: Secondary | ICD-10-CM

## 2019-12-10 NOTE — Assessment & Plan Note (Signed)
PAD, moderate disease bilaterally

## 2019-12-10 NOTE — Progress Notes (Signed)
Location:    [facility] Room Number: 37 Place of Service:  SNF (31) Provider: Lennie Odor Annabeth Tortora NP  [name], MD  Patient Care Team: [name], MD as PCP - General (Internal Medicine) [name], MD as Consulting Physician (Urology) [name], MD as Consulting Physician (Neurology) [name], MD as Consulting Physician (Dermatology) [name], MD (Inactive) as Consulting Physician (Gastroenterology) [name], MD as Consulting Physician (Orthopedic Surgery) [name], MD as Consulting Physician (Cardiology) [name], MD as Consulting Physician (Ophthalmology) [name], MD as Referring Physician (Psychiatry) [name], MD as Consulting Physician (General Surgery) [name], MD as Consulting Physician (Orthopedic Surgery) [place], [name], NP as Nurse Practitioner (Family Medicine) Dohn Stclair X, NP as Nurse Practitioner (Internal Medicine)  Extended Emergency Contact Information Primary Emergency Contact: Ipock,[name] Address: [address]          [place], [place] [address] [place] Phone: [phone] Relation: Spouse Secondary Emergency Contact: Durwin Glaze Mobile Phone: [phone] Relation: Daughter  Code Status:  DNR Goals of care: Advanced Directive information Advanced Directives 11/15/2019  Does Patient Have a Medical Advance Directive? Yes  Type of Advance Directive Out of facility DNR (pink MOST or yellow form);Living will;Healthcare Power of Attorney  Does patient want to make changes to medical advance directive? No - Patient declined  Copy of [place] in Chart? Yes - validated most recent copy scanned in chart (See row information)  Pre-existing out of facility DNR order (yellow form or pink MOST form) Yellow form placed in chart (order not valid for inpatient use);Pink MOST form placed in chart (order not valid  for inpatient use)     Chief Complaint  Patient presents with  . Acute Visit    Fall X2    HPI:  Pt is a 86 y.o. male seen today for an acute visit for Fall x2 3 am and 10 pm 12/09/19, found on the floor in bathroom and near closet, lack of safety awareness, needs assistance for transfer. No apparent injury, the patient denied dizziness, focal weakness, chest pain/pressure or palpitation associated with falling.               Psychotic featured depression/anxiety/delusion/hallucinatio,takesSertralien200mg  qd, Mirtazapine 60mg  qd, Clonazepam 0.5mg  qhs, Bupropion 300mg  qd, Abilifyoff, Quetiapine 25mg  qam, 50mg  qd, MRI brainunremarkable. Hx of OA, stable, on Tylenol 1000mg  tid. Hypothyroidism, stable, on Levothyroxine 66mcg qd 6x/wk, 57mcg qd 1x/wk. TSH 2.04 11/19/19 Constipation, stable, on Linzess qd, MiraLax qd.  BPH, stable, on Tamsulosin.  Essential tremor, on Topamax BID.  Rhinitis, takes Loratadine 10mg  qd.             PAD, moderate disease bilaterally      Past Medical History:  Diagnosis Date  . Acute urinary retention 01/28/2013  . AKI (acute kidney injury) ([place]) 01/26/2013  . Altered mental status 09/09/2013  . Anemia    B12 deficiency  . ANXIETY 06/07/2007   Qualifier: Diagnosis of  By: Talbert Cage CMA ([place]), June    . ARTHRITIS 06/07/2007   Qualifier: Diagnosis of  By: Talbert Cage CMA ([place]), June    . Balance problems 04/05/2011  . Benign essential tremor   . Benign fibroma of prostate 10/11/2011  . Bilateral inguinal hernia 10/09/2012  . Bilateral leg edema 09/13/2012  . Bladder retention 02/05/2013  . Bradycardia, sinus 08/02/2012  . CAROTID ARTERY DISEASE 03/31/2007  Qualifier: Diagnosis of  By: Linna Darner MD, Gwyndolyn Saxon    . Carotid stenosis    s/p L CEA  . Cataract, nuclear 03/21/2014  . 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 Cervical spinal stenosis   . Cervical stenosis of spine   . Critical  lower limb ischemia ([place])   . Degeneration macular 11/02/2011  . Depression    Dr Albertine Patricia  . Difficulty in walking(719.7) 04/05/2011  . ESOPHAGITIS 08/06/2002   Qualifier: Diagnosis of  By: Talbert Cage CMA ([place]), June    . Essential and other specified forms of tremor 04/18/2013  . Fall at home 01/06/2017   left hip pain  . Falls   . Family history of colon cancer 07/24/2012  . Fecal impaction ([place]) 10/11/2012  . Femoral hernia, bilateral s/p lap repair 01/16/2013 01/16/2013  . Glaucoma, compensated 03/21/2014  . H/O cardiovascular stress test    a. 02/2003 -> no ischemia/infarct.  Marland Kitchen HAMMER TOE 04/23/2010   Qualifier: Diagnosis of  By: Linna Darner MD, Gwyndolyn Saxon    . Hematoma of leg   . Hip hematoma, right 09/09/2013  . History of shingles 2009  . HTN (hypertension) 08/22/2012  . Hx of echocardiogram    Echocardiogram 08/01/12: Mild LVH, EF 55-60%, normal wall motion.  . Hydrocele 10/11/2011  . Hyperlipidemia   . 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 Hypothyroidism   . Macular degeneration disease   . Macular edema   . Migraines    Dr Jannifer Franklin  . MORTON'S NEUROMA, LEFT 08/22/2007   Qualifier: Diagnosis of  By: Linna Darner MD, Gwyndolyn Saxon    . PAD (peripheral artery disease) ([facility])   . Peripheral neuropathy   . Personal history of colonic polyps 01/05/2013   2014 2 mm adenomatous polyp   . PREMATURE ATRIAL CONTRACTIONS 03/14/2006   Qualifier: Diagnosis of  By: Linna Darner MD, Gwendolyn Lima 12/16/2011  . SPINAL STENOSIS 03/14/2006   Qualifier: Diagnosis of  By: Linna Darner MD, William   Cervical spine    . Testosterone deficiency    Dr Lawerance Bach, [facility]  . Unspecified vitamin D deficiency 08/06/2012  . Urge incontinence 10/09/2012  . Urinary tract infection, site not specified 02/13/2013  . Weakness 08/02/2012   Past Surgical History:  Procedure Laterality Date  . APPENDECTOMY  1941  . BREAST CYST EXCISION Left 01/16/2013   Procedure: MASS EXCISION AXILLA;  Surgeon: Adin Hector, MD;  Location: [facility];   Service: General;  Laterality: Left;  . CAROTID ENDARTERECTOMY  2005    L  . CATARACT EXTRACTION Right   . COLONOSCOPY  2014   Dr. Deatra Ina  . EYE SURGERY Right 02/2012   retina  . INGUINAL HERNIA REPAIR Bilateral 01/16/2013   Procedure: LAPAROSCOPIC BILATERAL INGUINAL DIRECT AND INDIRECT, FEMORAL, AND OBTURATOR HERNIAS;  Surgeon: Adin Hector, MD;  Location: [facility];  Service: General;  Laterality: Bilateral;  . INSERTION OF MESH N/A 01/16/2013   Procedure: INSERTION OF MESH;  Surgeon: Adin Hector, MD;  Location: [facility];  Service: General;  Laterality: N/A;  . LUMBAR LAMINECTOMY    . TONSILLECTOMY AND ADENOIDECTOMY    . TOTAL HIP ARTHROPLASTY  2005   L    Allergies  Allergen Reactions  . Latex Rash  . Tape Rash    Paper tape is ok to use     Allergies as of 12/10/2019      Reactions   Latex Rash   Tape Rash   Paper tape is ok to use  Medication List       Accurate as of December 10, 2019 11:59 PM. If you have any questions, ask your nurse or doctor.        acetaminophen 500 MG tablet Commonly known as: TYLENOL Take 1,000 mg by mouth 3 (three) times daily. And q6hprn for pain/fever   aspirin 81 MG tablet Take 81 mg by mouth every morning.   atorvastatin 20 MG tablet Commonly known as: LIPITOR Take 20 mg by mouth daily.   B-complex with vitamin C tablet Take 1 tablet by mouth daily.   bimatoprost 0.01 % Soln Commonly known as: LUMIGAN Place 1 drop into both eyes at bedtime.   buPROPion 300 MG 24 hr tablet Commonly known as: WELLBUTRIN XL Take 300 mg by mouth daily. per psychiatry recommendation   clonazePAM 0.5 MG tablet Commonly known as: KLONOPIN Take 1 tablet (0.5 mg total) by mouth at bedtime.   erythromycin ophthalmic ointment Place 1 application into both eyes at bedtime.   ICAPS PO Take 1 tablet by mouth daily.   ketoconazole 2 % cream Commonly known as: NIZORAL Apply 1 application topically 2 (two) times daily as needed for  irritation. Apply to buttocks.   lactose free nutrition Liqd Take 237 mLs by mouth See admin instructions. 2 BID Between meals, 1 Boost milkshake with QHS snack   levothyroxine 50 MCG tablet Commonly known as: SYNTHROID Take 25 mcg by mouth. On Sundays   levothyroxine 50 MCG tablet Commonly known as: SYNTHROID Take 50 mcg by mouth See admin instructions. Take daily except for Sundays 1/2 Tablet = 25mcg.   linaclotide 145 MCG Caps capsule Commonly known as: LINZESS Take 290 mcg by mouth daily before breakfast.   loratadine 10 MG tablet Commonly known as: CLARITIN Take 10 mg by mouth daily.   melatonin 3 MG Tabs tablet Take 3 mg by mouth at bedtime.   mirtazapine 30 MG tablet Commonly known as: REMERON Take 60 mg by mouth at bedtime.   polyethylene glycol 17 g packet Commonly known as: MIRALAX / GLYCOLAX Take 17 g by mouth daily. Hold for loose stool   QUEtiapine 25 MG tablet Commonly known as: SEROQUEL Take 25 mg by mouth every morning.   QUEtiapine 50 MG tablet Commonly known as: SEROQUEL Take 50 mg by mouth daily.   Robafen DM Cgh/Chest Congest 10-100 MG/5ML liquid Generic drug: dextromethorphan-guaiFENesin Take 10 mLs by mouth every 6 (six) hours as needed for cough.   sertraline 100 MG tablet Commonly known as: ZOLOFT Take 200 mg by mouth every morning. In the morning. $RemoveBefo'200mg'HFidxhhBupu$  in Total.   Systane 0.4-0.3 % Soln Generic drug: Polyethyl Glycol-Propyl Glycol Apply 1 drop to eye 4 (four) times daily.   tamsulosin 0.4 MG Caps capsule Commonly known as: FLOMAX Take 0.4 mg by mouth daily after supper.   topiramate 25 MG tablet Commonly known as: TOPAMAX Take 25 mg by mouth 2 (two) times daily.   Vitamin D3 25 MCG (1000 UT) Caps Take 1,000 Units by mouth daily.   zinc oxide 20 % ointment Apply 1 application topically as needed for irritation.       Review of Systems  Constitutional: Negative for appetite change, fatigue and fever.  HENT: Positive for  hearing loss. Negative for congestion and voice change.   Eyes: Positive for visual disturbance.  Respiratory: Negative for cough and shortness of breath.   Cardiovascular: Negative for chest pain, palpitations and leg swelling.  Gastrointestinal: Negative for abdominal pain and constipation.  Genitourinary: Negative  for difficulty urinating and dysuria.       Incontinent of urine.   Musculoskeletal: Positive for arthralgias and gait problem.  Skin: Negative for color change.  Neurological: Positive for tremors. Negative for speech difficulty, light-headedness and headaches.       Memory lapses. Fine tremor in fingers.   Psychiatric/Behavioral: Positive for hallucinations. Negative for behavioral problems and sleep disturbance. The patient is nervous/anxious.        Delusions, anxiety, hallucination improved    Immunization History  Administered Date(s) Administered  . DTaP 11/05/2005  . Hepatitis B 1932/05/19  . Influenza Split 11/12/2011, 11/29/2013  . Influenza Whole 10/30/2007, 11/12/2008, 11/26/2009  . Influenza, High Dose Seasonal PF 12/31/2015, 11/24/2016, 11/30/2018  . Influenza,inj,quad, With Preservative 10/16/2016  . Influenza-Unspecified 11/28/2017, 11/20/2019  . Moderna SARS-COVID-2 Vaccination 02/19/2019, 03/19/2019  . PPD Test 06/23/2011  . Pneumococcal Conjugate-13 01/14/2016  . Pneumococcal Polysaccharide-23 02/16/1999  . Tdap 01/13/2017  . Varicella 11/09/2005  . Zoster 08/30/2006   Pertinent  Health Maintenance Due  Topic Date Due  . INFLUENZA VACCINE  Completed  . PNA vac Low Risk Adult  Completed   Fall Risk  10/28/2017 10/27/2016 06/09/2016 02/03/2016 10/28/2015  Falls in the past year? Yes Yes Yes Yes Yes  Number falls in past yr: 1 2 or more 2 or more 2 or more 2 or more  Comment - - - 2 falls in last 2 months -  Injury with Fall? Yes Yes No Yes No  Comment - L hip - scraps and bruises -  Risk Factor Category  - - - - -  Risk for fall due to : - - - -  Impaired balance/gait  Follow up - - - - -   Functional Status Survey:    Vitals:   12/10/19 1447  BP: 140/68  Pulse: 60  Resp: 20  Temp: 97.8 F (36.6 C)  SpO2: 97%  Weight: 141 lb 3.2 oz (64 kg)  Height: 6' (1.829 m)   Body mass index is 19.15 kg/m. Physical Exam Vitals and nursing note reviewed.  Constitutional:      Appearance: Normal appearance.  HENT:     Head: Normocephalic and atraumatic.  Eyes:     Extraocular Movements: Extraocular movements intact.     Conjunctiva/sclera: Conjunctivae normal.     Pupils: Pupils are equal, round, and reactive to light.     Comments: Left eye blind.   Cardiovascular:     Rate and Rhythm: Normal rate and regular rhythm.     Heart sounds: No murmur heard.   Pulmonary:     Effort: Pulmonary effort is normal.     Breath sounds: No rales.  Abdominal:     General: Bowel sounds are normal.     Palpations: Abdomen is soft.     Tenderness: There is no abdominal tenderness.  Musculoskeletal:     Cervical back: Normal range of motion and neck supple.     Right lower leg: No edema.     Left lower leg: No edema.  Skin:    General: Skin is warm and dry.  Neurological:     General: No focal deficit present.     Mental Status: He is alert. Mental status is at baseline.     Motor: No weakness.     Gait: Gait abnormal.     Comments: Oriented to person, place. Fine, mild resting tremor in fingers, not disabling.   Psychiatric:        Mood and Affect:  Mood normal.        Behavior: Behavior normal.     Comments: Less hallucination, delusion     Labs reviewed: Recent Labs    04/26/19 0320 04/26/19 0320 04/26/19 0534 04/26/19 0534 04/27/19 0504 08/23/19 0000 11/19/19 0000  NA 143   < > 143   < > 141 142 142  K 3.7   < > 3.4*   < > 4.4 4.1 4.1  CL 109   < > 114*   < > 110 111* 111*  CO2  --   --  23   < > 22 26* 24*  GLUCOSE 115*  --  114*  --  95  --   --   BUN 36*   < > 38*   < > 36* 31* 34*  CREATININE 1.10   < > 1.04    < > 1.07 1.0 1.2  CALCIUM  --   --  8.2*   < > 8.3* 8.5* 8.8   < > = values in this interval not displayed.   Recent Labs    04/26/19 0534 04/26/19 0534 04/27/19 0504 08/23/19 0000 11/19/19 0000  AST 24   < > $R'22 15 17  'Pw$ ALT 20   < > $R'23 12 12  'Ep$ ALKPHOS 36*   < > 34* 43 43  BILITOT 0.6  --  0.6  --   --   PROT 5.4*  --  5.4*  --   --   ALBUMIN 2.8*  --  2.5* 3.1*  --    < > = values in this interval not displayed.   Recent Labs    04/26/19 0128 04/26/19 0320 04/26/19 0534 04/26/19 0534 04/27/19 0504 08/23/19 0000 11/19/19 0000  WBC 3.2*   < > 2.8*   < > 4.3 5.5 4.7  NEUTROABS 2.3   < > 1.9  --  2.6 3,894  --   HGB 15.5   < > 11.9*   < > 12.2* 13.0* 12.9*  HCT 50.0   < > 38.4*   < > 38.3* 40* 39*  MCV 96.9  --  97.5  --  95.0  --   --   PLT 139*   < > 123*   < > 132* 137* 135*   < > = values in this interval not displayed.   Lab Results  Component Value Date   TSH 2.04 11/19/2019   Lab Results  Component Value Date   HGBA1C 5 01/26/2018   Lab Results  Component Value Date   CHOL 136 11/19/2019   HDL 42 11/19/2019   LDLCALC 78 11/19/2019   LDLDIRECT 132.5 09/08/2012   TRIG 79 11/19/2019   CHOLHDL 2.7 11/15/2016    Significant Diagnostic Results in last 30 days:  No results found.  Assessment/Plan Frequent falls Fall x2 3 am and 10 pm 12/09/19, found on the floor in bathroom and near closet, lack of safety awareness, needs assistance for transfer. Will update CBC/diff, CMP/eGFR.   PAD (peripheral artery disease) (H[facility]) PAD, moderate disease bilaterally   Allergic rhinitis Rhinitis, takes Loratadine $RemoveBeforeDE'10mg'ObmIaWjBEkdkhqY$  qd.  Benign essential tremor Essential tremor, on Topamax BID.    Benign prostatic hyperplasia with urinary obstruction BPH, stable, on Tamsulosin.   Chronic constipation Constipation, stable, on Linzess qd, MiraLax qd.    Hypothyroidism Hypothyroidism, stable, on Levothyroxine 53mcg qd 6x/wk, 55mcg qd 1x/wk. TSH 2.04 11/19/19   Generalized  osteoarthritis of multiple sites Hx of OA, stable, on Tylenol $RemoveBe'1000mg'BqBDNPoAA$  tid.  Major  psychotic depression, recurrent ([place]) Psychotic featured depression/anxiety/delusion/hallucinatio,takesSertralien200mg  qd, Mirtazapine 60mg  qd, Clonazepam 0.5mg  qhs, Bupropion 300mg  qd, Abilifyoff, Quetiapine 25mg  qam, 50mg  qd, MRI brainunremarkable.     Family/ staff Communication: plan of care reviewed with the patient and charge nurse.   Labs/tests ordered: CBC/diff, CMP/eGFR  Time spend 35 minutes.

## 2019-12-10 NOTE — Assessment & Plan Note (Signed)
Rhinitis, takes Loratadine 10mg  qd.

## 2019-12-10 NOTE — Assessment & Plan Note (Signed)
Fall x2 3 am and 10 pm 12/09/19, found on the floor in bathroom and near closet, lack of safety awareness, needs assistance for transfer. Will update CBC/diff, CMP/eGFR.

## 2019-12-10 NOTE — Assessment & Plan Note (Signed)
Hx of OA, stable, on Tylenol 1000mg  tid.

## 2019-12-10 NOTE — Assessment & Plan Note (Signed)
Psychotic featured depression/anxiety/delusion/hallucinatio,takesSertralien200mg  qd, Mirtazapine 60mg  qd, Clonazepam 0.5mg  qhs, Bupropion 300mg  qd, Abilifyoff, Quetiapine 25mg  qam, 50mg  qd, MRI brainunremarkable.

## 2019-12-10 NOTE — Assessment & Plan Note (Signed)
BPH, stable, on Tamsulosin.

## 2019-12-10 NOTE — Assessment & Plan Note (Signed)
Essential tremor, on Topamax BID.

## 2019-12-10 NOTE — Assessment & Plan Note (Signed)
Constipation, stable, on Linzess qd, MiraLax qd.

## 2019-12-10 NOTE — Assessment & Plan Note (Signed)
Hypothyroidism, stable, on Levothyroxine 23mcg qd 6x/wk, 33mcg qd 1x/wk. TSH 2.04 11/19/19

## 2019-12-11 ENCOUNTER — Encounter: Payer: Self-pay | Admitting: Nurse Practitioner

## 2019-12-13 LAB — CBC AND DIFFERENTIAL
HCT: 43 (ref 41–53)
Hemoglobin: 14 (ref 13.5–17.5)
Neutrophils Absolute: 3001
Platelets: 129 — AB (ref 150–399)
WBC: 4.4

## 2019-12-13 LAB — BASIC METABOLIC PANEL
BUN: 26 — AB (ref 4–21)
CO2: 27 — AB (ref 13–22)
Chloride: 107 (ref 99–108)
Creatinine: 1.1 (ref 0.6–1.3)
Glucose: 78
Potassium: 4.1 (ref 3.4–5.3)
Sodium: 141 (ref 137–147)

## 2019-12-13 LAB — HEPATIC FUNCTION PANEL
ALT: 18 (ref 10–40)
AST: 23 (ref 14–40)
Alkaline Phosphatase: 46 (ref 25–125)
Bilirubin, Total: 0.4

## 2019-12-13 LAB — COMPREHENSIVE METABOLIC PANEL
Albumin: 3.5 (ref 3.5–5.0)
Calcium: 8.7 (ref 8.7–10.7)
Globulin: 2.4

## 2019-12-13 LAB — CBC: RBC: 4.53 (ref 3.87–5.11)

## 2019-12-18 ENCOUNTER — Encounter: Payer: Self-pay | Admitting: Nurse Practitioner

## 2019-12-18 ENCOUNTER — Non-Acute Institutional Stay (SKILLED_NURSING_FACILITY): Payer: Medicare Other | Admitting: Nurse Practitioner

## 2019-12-18 DIAGNOSIS — F333 Major depressive disorder, recurrent, severe with psychotic symptoms: Secondary | ICD-10-CM | POA: Diagnosis not present

## 2019-12-18 DIAGNOSIS — N138 Other obstructive and reflux uropathy: Secondary | ICD-10-CM

## 2019-12-18 DIAGNOSIS — R059 Cough, unspecified: Secondary | ICD-10-CM

## 2019-12-18 DIAGNOSIS — E039 Hypothyroidism, unspecified: Secondary | ICD-10-CM

## 2019-12-18 DIAGNOSIS — N401 Enlarged prostate with lower urinary tract symptoms: Secondary | ICD-10-CM

## 2019-12-18 DIAGNOSIS — K5909 Other constipation: Secondary | ICD-10-CM

## 2019-12-18 DIAGNOSIS — M25561 Pain in right knee: Secondary | ICD-10-CM

## 2019-12-18 DIAGNOSIS — J309 Allergic rhinitis, unspecified: Secondary | ICD-10-CM

## 2019-12-18 DIAGNOSIS — G8929 Other chronic pain: Secondary | ICD-10-CM

## 2019-12-18 DIAGNOSIS — I739 Peripheral vascular disease, unspecified: Secondary | ICD-10-CM

## 2019-12-18 DIAGNOSIS — G250 Essential tremor: Secondary | ICD-10-CM

## 2019-12-18 NOTE — Assessment & Plan Note (Signed)
PAD, moderate disease bilaterally

## 2019-12-18 NOTE — Assessment & Plan Note (Signed)
Hypothyroidism, stable, on Levothyroxine 48mcg qd 6x/wk, 15mcg qd 1x/wk.TSH 2.04 11/19/19

## 2019-12-18 NOTE — Assessment & Plan Note (Signed)
Hx of OA, stable, on Tylenol 1000mg  tid.

## 2019-12-18 NOTE — Assessment & Plan Note (Signed)
Essential tremor, on Topamax BID.

## 2019-12-18 NOTE — Assessment & Plan Note (Signed)
Rhinitis, takes Loratadine 10mg  qd.

## 2019-12-18 NOTE — Assessment & Plan Note (Signed)
BPH, stable, on Tamsulosin.

## 2019-12-18 NOTE — Progress Notes (Signed)
Location:    [facility] Room Number: 37 Place of Service:  SNF (31) Provider: Lennie Odor Tafari [place]iston NP  [name], MD  Patient Care Team: [name], MD as PCP - General (Internal Medicine) [name], MD as Consulting Physician (Urology) [name], MD as Consulting Physician (Neurology) [name], MD as Consulting Physician (Dermatology) [name], MD (Inactive) as Consulting Physician (Gastroenterology) [name], MD as Consulting Physician (Orthopedic Surgery) [name], MD as Consulting Physician (Cardiology) [name], MD as Consulting Physician (Ophthalmology) [name], MD as Referring Physician (Psychiatry) [name], MD as Consulting Physician (General Surgery) [name], MD as Consulting Physician (Orthopedic Surgery) [place], [name], NP as Nurse Practitioner (Family Medicine) Walter Grima X, NP as Nurse Practitioner (Internal Medicine)  Extended Emergency Contact Information Primary Emergency Contact: Dung,[name] Address: [address]          [place], [place] [address] [place] Phone: [phone] Relation: Spouse Secondary Emergency Contact: Durwin Glaze Mobile Phone: [phone] Relation: Daughter  Code Status:  DNR Goals of care: Advanced Directive information Advanced Directives 11/15/2019  Does Patient Have a Medical Advance Directive? Yes  Type of Advance Directive Out of facility DNR (pink MOST or yellow form);Living will;Healthcare Power of Attorney  Does patient want to make changes to medical advance directive? No - Patient declined  Copy of [place] in Chart? Yes - validated most recent copy scanned in chart (See row information)  Pre-existing out of facility DNR order (yellow form or pink MOST form) Yellow form placed in chart (order not valid for inpatient use);Pink MOST form placed in chart (order not valid  for inpatient use)     Chief Complaint  Patient presents with   Acute Visit    Cough, nasal congestion.    HPI:  Pt is a 86 y.o. male seen today for an acute visit for nasal congestion, non productive cough. The patient denied facial pressure, shore throat,  chest pain, SOB, phlegm production. The patient is afebrile.   Psychotic featured depression/anxiety/delusion/hallucinatio,takesSertralien200mg  qd, Mirtazapine 60mg  qd, Clonazepam 0.5mg  qhs, Bupropion 300mg  qd, Abilifyoff, Quetiapine 25mg  qam, 50mg  qd, MRI brainunremarkable. Hx of OA, stable, on Tylenol 1000mg  tid. Hypothyroidism, stable, on Levothyroxine 68mcg qd 6x/wk, 47mcg qd 1x/wk.TSH 2.04 11/19/19 Constipation, stable, on Linzess qd, MiraLax qd.  BPH, stable, on Tamsulosin.  Essential tremor, on Topamax BID.  Rhinitis, takes Loratadine 10mg  qd. PAD, moderate disease bilaterally     Past Medical History:  Diagnosis Date   Acute urinary retention 01/28/2013   AKI (acute kidney injury) ([place]) 01/26/2013   Altered mental status 09/09/2013   Anemia    B12 deficiency   ANXIETY 06/07/2007   Qualifier: Diagnosis of  By: Talbert Cage CMA (AAMA), June     ARTHRITIS 06/07/2007   Qualifier: Diagnosis of  By: Talbert Cage CMA (AAMA), June     Balance problems 04/05/2011   Benign essential tremor    Benign fibroma of prostate 10/11/2011   Bilateral inguinal hernia 10/09/2012   Bilateral leg edema 09/13/2012   Bladder retention 02/05/2013   Bradycardia, sinus 08/02/2012   CAROTID ARTERY DISEASE 03/31/2007   Qualifier: Diagnosis of  By: Linna Darner MD, William     Carotid stenosis    s/p L CEA   Cataract, nuclear 03/21/2014   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   Cervical spinal stenosis    Cervical stenosis of spine  Critical lower limb ischemia ([place])    Degeneration macular 11/02/2011   Depression    Dr Albertine Patricia   Difficulty in  walking(719.7) 04/05/2011   ESOPHAGITIS 08/06/2002   Qualifier: Diagnosis of  By: Talbert Cage CMA ([place]), June     Essential and other specified forms of tremor 04/18/2013   Fall at home 01/06/2017   left hip pain   Falls    Family history of colon cancer 07/24/2012   Fecal impaction ([place]) 10/11/2012   Femoral hernia, bilateral s/p lap repair 01/16/2013 01/16/2013   Glaucoma, compensated 03/21/2014   H/O cardiovascular stress test    a. 02/2003 -> no ischemia/infarct.   HAMMER TOE 04/23/2010   Qualifier: Diagnosis of  By: Linna Darner MD, William     Hematoma of leg    Hip hematoma, right 09/09/2013   History of shingles 2009   HTN (hypertension) 08/22/2012   Hx of echocardiogram    Echocardiogram 08/01/12: Mild LVH, EF 55-60%, normal wall motion.   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   Hypothyroidism    Macular degeneration disease    Macular edema    Migraines    Dr Jannifer Franklin   MORTON'S NEUROMA, LEFT 08/22/2007   Qualifier: Diagnosis of  By: Linna Darner MD, Gwyndolyn Saxon     PAD (peripheral artery disease) [facility])    Peripheral neuropathy    Personal history of colonic polyps 01/05/2013   2014 2 mm adenomatous polyp    PREMATURE ATRIAL CONTRACTIONS 03/14/2006   Qualifier: Diagnosis of  By: Linna Darner MD, Gwendolyn Lima 12/16/2011   SPINAL STENOSIS 03/14/2006   Qualifier: Diagnosis of  By: Linna Darner MD, William   Cervical spine     Testosterone deficiency    Dr Lawerance Bach, [facility]   Unspecified vitamin D deficiency 08/06/2012   Urge incontinence 10/09/2012   Urinary tract infection, site not specified 02/13/2013   Weakness 08/02/2012   Past Surgical History:  Procedure Laterality Date   APPENDECTOMY  1941   BREAST CYST EXCISION Left 01/16/2013   Procedure: MASS EXCISION AXILLA;  Surgeon: Adin Hector, MD;  Location: [facility];  Service: General;  Laterality: Left;   CAROTID ENDARTERECTOMY  2005    L   CATARACT EXTRACTION Right     COLONOSCOPY  2014   Dr. Deatra Ina   EYE SURGERY Right 02/2012   retina   INGUINAL HERNIA REPAIR Bilateral 01/16/2013   Procedure: LAPAROSCOPIC BILATERAL INGUINAL DIRECT AND INDIRECT, FEMORAL, AND OBTURATOR HERNIAS;  Surgeon: Adin Hector, MD;  Location: [place] [facility];  Service: General;  Laterality: Bilateral;   INSERTION OF MESH N/A 01/16/2013   Procedure: INSERTION OF MESH;  Surgeon: Adin Hector, MD;  Location: [facility];  Service: General;  Laterality: N/A;   LUMBAR LAMINECTOMY     TONSILLECTOMY AND ADENOIDECTOMY     TOTAL HIP ARTHROPLASTY  2005   L    Allergies  Allergen Reactions   Latex Rash   Tape Rash    Paper tape is ok to use     Allergies as of 12/18/2019      Reactions   Latex Rash   Tape Rash   Paper tape is ok to use      Medication List       Accurate as of December 18, 2019 11:59 PM. If you have any questions, ask your nurse or doctor.        acetaminophen 500 MG tablet Commonly known as: TYLENOL Take 1,000 mg by  mouth 3 (three) times daily. And q6hprn for pain/fever   aspirin 81 MG tablet Take 81 mg by mouth every morning.   atorvastatin 20 MG tablet Commonly known as: LIPITOR Take 20 mg by mouth daily.   B-complex with vitamin C tablet Take 1 tablet by mouth daily.   bimatoprost 0.01 % Soln Commonly known as: LUMIGAN Place 1 drop into both eyes at bedtime.   buPROPion 300 MG 24 hr tablet Commonly known as: WELLBUTRIN XL Take 300 mg by mouth daily. per psychiatry recommendation   clonazePAM 0.5 MG tablet Commonly known as: KLONOPIN Take 1 tablet (0.5 mg total) by mouth at bedtime.   erythromycin ophthalmic ointment Place 1 application into both eyes at bedtime.   ICAPS PO Take 1 tablet by mouth daily.   ketoconazole 2 % cream Commonly known as: NIZORAL Apply 1 application topically 2 (two) times daily as needed for irritation. Apply to buttocks.   lactose free nutrition Liqd Take 237 mLs by mouth See admin instructions. 2 BID  Between meals, 1 Boost milkshake with QHS snack   levothyroxine 50 MCG tablet Commonly known as: SYNTHROID Take 25 mcg by mouth. On Sundays   levothyroxine 50 MCG tablet Commonly known as: SYNTHROID Take 50 mcg by mouth See admin instructions. Take daily except for Sundays 1/2 Tablet = 25mcg.   linaclotide 145 MCG Caps capsule Commonly known as: LINZESS Take 290 mcg by mouth daily before breakfast.   loratadine 10 MG tablet Commonly known as: CLARITIN Take 10 mg by mouth daily.   melatonin 3 MG Tabs tablet Take 3 mg by mouth at bedtime.   mirtazapine 30 MG tablet Commonly known as: REMERON Take 60 mg by mouth at bedtime.   polyethylene glycol 17 g packet Commonly known as: MIRALAX / GLYCOLAX Take 17 g by mouth daily. Hold for loose stool   QUEtiapine 25 MG tablet Commonly known as: SEROQUEL Take 25 mg by mouth every morning.   QUEtiapine 50 MG tablet Commonly known as: SEROQUEL Take 50 mg by mouth daily.   Robafen DM Cgh/Chest Congest 10-100 MG/5ML liquid Generic drug: dextromethorphan-guaiFENesin Take 10 mLs by mouth every 6 (six) hours as needed for cough.   sertraline 100 MG tablet Commonly known as: ZOLOFT Take 200 mg by mouth every morning. In the morning. 200mg  in Total.   Systane 0.4-0.3 % Soln Generic drug: Polyethyl Glycol-Propyl Glycol Apply 1 drop to eye 4 (four) times daily.   tamsulosin 0.4 MG Caps capsule Commonly known as: FLOMAX Take 0.4 mg by mouth daily after supper.   topiramate 25 MG tablet Commonly known as: TOPAMAX Take 25 mg by mouth 2 (two) times daily.   Vitamin D3 25 MCG (1000 UT) Caps Take 1,000 Units by mouth daily.   zinc oxide 20 % ointment Apply 1 application topically as needed for irritation.       Review of Systems  Constitutional: Negative for appetite change, fatigue and fever.  HENT: Positive for congestion, hearing loss and rhinorrhea. Negative for sinus pressure, sinus pain, sore throat and voice change.     Eyes: Positive for visual disturbance.  Respiratory: Positive for cough. Negative for chest tightness, shortness of breath and wheezing.   Cardiovascular: Negative for chest pain, palpitations and leg swelling.  Gastrointestinal: Negative for abdominal pain and constipation.  Genitourinary: Negative for difficulty urinating and dysuria.       Incontinent of urine.   Musculoskeletal: Positive for arthralgias and gait problem.  Skin: Negative for color change.  Neurological:  Positive for tremors. Negative for speech difficulty, light-headedness and headaches.       Memory lapses. Fine tremor in fingers.   Psychiatric/Behavioral: Positive for hallucinations. Negative for behavioral problems and sleep disturbance. The patient is nervous/anxious.        Delusions, anxiety, hallucination improved    Immunization History  Administered Date(s) Administered   DTaP 11/05/2005   Hepatitis B [birth year]   Influenza Split 11/12/2011, 11/29/2013   Influenza Whole 10/30/2007, 11/12/2008, 11/26/2009   Influenza, High Dose Seasonal PF 12/31/2015, 11/24/2016, 11/30/2018   Influenza,inj,quad, With Preservative 10/16/2016   Influenza-Unspecified 11/28/2017, 11/20/2019   Moderna SARS-COVID-2 Vaccination 02/19/2019, 03/19/2019   PPD Test 06/23/2011   Pneumococcal Conjugate-13 01/14/2016   Pneumococcal Polysaccharide-23 02/16/1999   Tdap 01/13/2017   Varicella 11/09/2005   Zoster 08/30/2006   Pertinent  Health Maintenance Due  Topic Date Due   INFLUENZA VACCINE  Completed   PNA vac Low Risk Adult  Completed   Fall Risk  10/28/2017 10/27/2016 06/09/2016 02/03/2016 10/28/2015  Falls in the past year? Yes Yes Yes Yes Yes  Number falls in past yr: 1 2 or more 2 or more 2 or more 2 or more  Comment - - - 2 falls in last 2 months -  Injury with Fall? Yes Yes No Yes No  Comment - L hip - scraps and bruises -  Risk Factor Category  - - - - -  Risk for fall due to : - - - - Impaired  balance/gait  Follow up - - - - -   Functional Status Survey:    Vitals:   12/18/19 1600  BP: 137/62  Pulse: 76  Resp: 19  Temp: 97.8 F (36.6 C)  SpO2: 94%  Weight: 142 lb 4.8 oz (64.5 kg)  Height: 6' (1.829 m)   Body mass index is 19.3 kg/m. Physical Exam Vitals and nursing note reviewed.  Constitutional:      Appearance: Normal appearance.  HENT:     Head: Normocephalic and atraumatic.     Nose: Congestion and rhinorrhea present.     Mouth/Throat:     Mouth: Mucous membranes are moist.     Pharynx: No oropharyngeal exudate or posterior oropharyngeal erythema.  Eyes:     Extraocular Movements: Extraocular movements intact.     Conjunctiva/sclera: Conjunctivae normal.     Pupils: Pupils are equal, round, and reactive to light.     Comments: Left eye blind.   Cardiovascular:     Rate and Rhythm: Normal rate and regular rhythm.     Heart sounds: No murmur heard.   Pulmonary:     Effort: Pulmonary effort is normal.     Breath sounds: Rales present. No wheezing or rhonchi.     Comments: Bibasilar rales. Abdominal:     General: Bowel sounds are normal.     Palpations: Abdomen is soft.     Tenderness: There is no abdominal tenderness.  Musculoskeletal:     Cervical back: Normal range of motion and neck supple.     Right lower leg: No edema.     Left lower leg: No edema.  Skin:    General: Skin is warm and dry.  Neurological:     General: No focal deficit present.     Mental Status: He is alert. Mental status is at baseline.     Motor: No weakness.     Gait: Gait abnormal.     Comments: Oriented to person, place. Fine, mild resting tremor in fingers, not  disabling.   Psychiatric:        Mood and Affect: Mood normal.        Behavior: Behavior normal.     Comments: Less hallucination, delusion     Labs reviewed: Recent Labs    04/26/19 0320 04/26/19 0320 04/26/19 0534 04/26/19 0534 04/27/19 0504 08/23/19 0000 11/19/19 0000  NA 143   < > 143   < > 141  142 142  K 3.7   < > 3.4*   < > 4.4 4.1 4.1  CL 109   < > 114*   < > 110 111* 111*  CO2  --   --  23   < > 22 26* 24*  GLUCOSE 115*  --  114*  --  95  --   --   BUN 36*   < > 38*   < > 36* 31* 34*  CREATININE 1.10   < > 1.04   < > 1.07 1.0 1.2  CALCIUM  --   --  8.2*   < > 8.3* 8.5* 8.8   < > = values in this interval not displayed.   Recent Labs    04/26/19 0534 04/26/19 0534 04/27/19 0504 08/23/19 0000 11/19/19 0000  AST 24   < > 22 15 17   ALT 20   < > 23 12 12   ALKPHOS 36*   < > 34* 43 43  BILITOT 0.6  --  0.6  --   --   PROT 5.4*  --  5.4*  --   --   ALBUMIN 2.8*  --  2.5* 3.1*  --    < > = values in this interval not displayed.   Recent Labs    04/26/19 0128 04/26/19 0320 04/26/19 0534 04/26/19 0534 04/27/19 0504 08/23/19 0000 11/19/19 0000  WBC 3.2*   < > 2.8*   < > 4.3 5.5 4.7  NEUTROABS 2.3   < > 1.9  --  2.6 3,894  --   HGB 15.5   < > 11.9*   < > 12.2* 13.0* 12.9*  HCT 50.0   < > 38.4*   < > 38.3* 40* 39*  MCV 96.9  --  97.5  --  95.0  --   --   PLT 139*   < > 123*   < > 132* 137* 135*   < > = values in this interval not displayed.   Lab Results  Component Value Date   TSH 2.04 11/19/2019   Lab Results  Component Value Date   HGBA1C 5 01/26/2018   Lab Results  Component Value Date   CHOL 136 11/19/2019   HDL 42 11/19/2019   LDLCALC 78 11/19/2019   LDLDIRECT 132.5 09/08/2012   TRIG 79 11/19/2019   CHOLHDL 2.7 11/15/2016    Significant Diagnostic Results in last 30 days:  No results found.  Assessment/Plan Cough nasal congestion, non productive cough. The patient denied facial pressure, shore throat,  chest pain, SOB, phlegm production. The patient is afebrile.  Will obtain CXR to evaluate further, Mucinex 600mg  bid x 3 days    Major psychotic depression, recurrent (HCC) Psychotic featured depression/anxiety/delusion/hallucinatio,takesSertralien200mg  qd, Mirtazapine 60mg  qd, Clonazepam 0.5mg  qhs, Bupropion 300mg  qd, Abilifyoff,  Quetiapine 25mg  qam, 50mg  qd, MRI brainunremarkable.  Knee pain, right Hx of OA, stable, on Tylenol 1000mg  tid.   Hypothyroidism Hypothyroidism, stable, on Levothyroxine 73mcg qd 6x/wk, 21mcg qd 1x/wk.TSH 2.04 11/19/19   Chronic constipation Constipation, stable, on Linzess qd, MiraLax qd.  Benign prostatic hyperplasia with urinary obstruction BPH, stable, on Tamsulosin.    Benign essential tremor Essential tremor, on Topamax BID.    Allergic rhinitis Rhinitis, takes Loratadine 10mg  qd.   PAD (peripheral artery disease) (HCC) PAD, moderate disease bilaterally    Family/ staff Communication: plan of care reviewed with the patient and charge nurse.   Labs/tests ordered:  CXR  Time spend 25 minutes.

## 2019-12-18 NOTE — Assessment & Plan Note (Signed)
Psychotic featured depression/anxiety/delusion/hallucinatio,takesSertralien200mg  qd, Mirtazapine 60mg  qd, Clonazepam 0.5mg  qhs, Bupropion 300mg  qd, Abilifyoff, Quetiapine 25mg  qam, 50mg  qd, MRI brainunremarkable.

## 2019-12-18 NOTE — Assessment & Plan Note (Signed)
nasal congestion, non productive cough. The patient denied facial pressure, shore throat,  chest pain, SOB, phlegm production. The patient is afebrile.  Will obtain CXR to evaluate further, Mucinex 600mg  bid x 3 days

## 2019-12-18 NOTE — Assessment & Plan Note (Signed)
Constipation, stable, on Linzess qd, MiraLax qd.

## 2019-12-19 ENCOUNTER — Encounter: Payer: Self-pay | Admitting: Nurse Practitioner

## 2019-12-20 ENCOUNTER — Encounter: Payer: Self-pay | Admitting: Internal Medicine

## 2019-12-20 ENCOUNTER — Non-Acute Institutional Stay (SKILLED_NURSING_FACILITY): Payer: Medicare Other | Admitting: Internal Medicine

## 2019-12-20 DIAGNOSIS — E785 Hyperlipidemia, unspecified: Secondary | ICD-10-CM

## 2019-12-20 DIAGNOSIS — N138 Other obstructive and reflux uropathy: Secondary | ICD-10-CM

## 2019-12-20 DIAGNOSIS — N401 Enlarged prostate with lower urinary tract symptoms: Secondary | ICD-10-CM | POA: Diagnosis not present

## 2019-12-20 DIAGNOSIS — G250 Essential tremor: Secondary | ICD-10-CM

## 2019-12-20 DIAGNOSIS — E039 Hypothyroidism, unspecified: Secondary | ICD-10-CM | POA: Diagnosis not present

## 2019-12-20 DIAGNOSIS — F333 Major depressive disorder, recurrent, severe with psychotic symptoms: Secondary | ICD-10-CM | POA: Diagnosis not present

## 2019-12-20 DIAGNOSIS — J189 Pneumonia, unspecified organism: Secondary | ICD-10-CM | POA: Diagnosis not present

## 2019-12-20 DIAGNOSIS — N1831 Chronic kidney disease, stage 3a: Secondary | ICD-10-CM

## 2019-12-20 NOTE — Progress Notes (Signed)
Location:    [place] Room Number: 37 Place of Service:  SNF [phone]) Provider:  Veleta Miners MD  [name], MD  Patient Care Team: [name], MD as PCP - General (Internal Medicine) [name], MD as Consulting Physician (Urology) [name], MD as Consulting Physician (Neurology) [name], MD as Consulting Physician (Dermatology) [name], MD (Inactive) as Consulting Physician (Gastroenterology) [name], MD as Consulting Physician (Orthopedic Surgery) [name], MD as Consulting Physician (Cardiology) [name], MD as Consulting Physician (Ophthalmology) [name], MD as Referring Physician (Psychiatry) [name], MD as Consulting Physician (General Surgery) [name], MD as Consulting Physician (Orthopedic Surgery) [place], [name], NP as Nurse Practitioner (Family Medicine) Mast, Man X, NP as Nurse Practitioner (Internal Medicine)  Extended Emergency Contact Information Primary Emergency Contact: [facility] Address: [address] [address] [place]          [place],  [address] [place] Phone: [phone] Relation: Spouse Secondary Emergency Contact: Durwin Glaze Mobile Phone: [phone] Relation: Daughter  Code Status:  DNR Goals of care: Advanced Directive information Advanced Directives 11/15/2019  Does Patient Have a Medical Advance Directive? Yes  Type of Advance Directive Out of facility DNR (pink MOST or yellow form);Living will;Healthcare Power of Attorney  Does patient want to make changes to medical advance directive? No - Patient declined  Copy of [place] in Chart? Yes - validated most recent copy scanned in chart (See row information)  Pre-existing out of facility DNR order (yellow form or pink MOST form) Yellow form placed in chart (order not valid for inpatient use);Pink MOST form placed in chart (order not  valid for inpatient use)     Chief Complaint  Patient presents with  . Acute Visit    Pneumonia    HPI:  Pt is a 86 y.o. male seen today for an acute visit for Pneumonia  He has h/o HTN, Hyperlipidemia, PAD, BPH , CKD stage 2, Hypothyroidism, and Major Depression Also Has h/o Cervial Stenosis with Compressive Myelopathy.  Was seen few days ago for Cough. Chest Xray showed Left Lower lobe pneumonia Started on Levaquin yesterday. Family wondering if hospice would be option Patient is not eating well. Sleeping more. Not SOB. No Fever or Chest pain Also Noticed that he has developed worsening Pressure wound  Unable to give much history today.   Past Medical History:  Diagnosis Date  . Acute urinary retention 01/28/2013  . AKI (acute kidney injury) ([place]) 01/26/2013  . Altered mental status 09/09/2013  . Anemia    B12 deficiency  . ANXIETY 06/07/2007   Qualifier: Diagnosis of  By: Talbert Cage CMA ([place]), June    . ARTHRITIS 06/07/2007   Qualifier: Diagnosis of  By: Talbert Cage CMA ([place]), June    . Balance problems 04/05/2011  . Benign essential tremor   . Benign fibroma of prostate 10/11/2011  . Bilateral inguinal hernia 10/09/2012  . Bilateral leg edema 09/13/2012  . Bladder retention 02/05/2013  . Bradycardia, sinus 08/02/2012  . CAROTID ARTERY DISEASE 03/31/2007   Qualifier: Diagnosis of  By: Linna Darner MD, Gwyndolyn Saxon    . Carotid stenosis    s/p L CEA  . Cataract, nuclear 03/21/2014  . 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 Cervical spinal stenosis   . Cervical stenosis of spine   . Critical lower limb ischemia ([place])   . Degeneration macular 11/02/2011  .  Depression    Dr Albertine Patricia  . Difficulty in walking(719.7) 04/05/2011  . ESOPHAGITIS 08/06/2002   Qualifier: Diagnosis of  By: Talbert Cage CMA ([place]), June    . Essential and other specified forms of tremor 04/18/2013  . Fall at home 01/06/2017   left hip pain  . Falls   . Family history of colon cancer 07/24/2012  . Fecal impaction ([place])  10/11/2012  . Femoral hernia, bilateral s/p lap repair 01/16/2013 01/16/2013  . Glaucoma, compensated 03/21/2014  . H/O cardiovascular stress test    a. 02/2003 -> no ischemia/infarct.  Marland Kitchen HAMMER TOE 04/23/2010   Qualifier: Diagnosis of  By: Linna Darner MD, Gwyndolyn Saxon    . Hematoma of leg   . Hip hematoma, right 09/09/2013  . History of shingles 2009  . HTN (hypertension) 08/22/2012  . Hx of echocardiogram    Echocardiogram 08/01/12: Mild LVH, EF 55-60%, normal wall motion.  . Hydrocele 10/11/2011  . Hyperlipidemia   . 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 Hypothyroidism   . Macular degeneration disease   . Macular edema   . Migraines    Dr Jannifer Franklin  . MORTON'S NEUROMA, LEFT 08/22/2007   Qualifier: Diagnosis of  By: Linna Darner MD, Gwyndolyn Saxon    . PAD (peripheral artery disease) ([place])   . Peripheral neuropathy   . Personal history of colonic polyps 01/05/2013   2014 2 mm adenomatous polyp   . PREMATURE ATRIAL CONTRACTIONS 03/14/2006   Qualifier: Diagnosis of  By: Linna Darner MD, Gwendolyn Lima 12/16/2011  . SPINAL STENOSIS 03/14/2006   Qualifier: Diagnosis of  By: Linna Darner MD, William   Cervical spine    . Testosterone deficiency    Dr Lawerance Bach, [facility]  . Unspecified vitamin D deficiency 08/06/2012  . Urge incontinence 10/09/2012  . Urinary tract infection, site not specified 02/13/2013  . Weakness 08/02/2012   Past Surgical History:  Procedure Laterality Date  . APPENDECTOMY  1941  . BREAST CYST EXCISION Left 01/16/2013   Procedure: MASS EXCISION AXILLA;  Surgeon: Adin Hector, MD;  Location: [facility];  Service: General;  Laterality: Left;  . CAROTID ENDARTERECTOMY  2005    L  . CATARACT EXTRACTION Right   . COLONOSCOPY  2014   Dr. Deatra Ina  . EYE SURGERY Right 02/2012   retina  . INGUINAL HERNIA REPAIR Bilateral 01/16/2013   Procedure: LAPAROSCOPIC BILATERAL INGUINAL DIRECT AND INDIRECT, FEMORAL, AND OBTURATOR HERNIAS;  Surgeon: Adin Hector, MD;  Location: [facility];  Service: General;  Laterality:  Bilateral;  . INSERTION OF MESH N/A 01/16/2013   Procedure: INSERTION OF MESH;  Surgeon: Adin Hector, MD;  Location: [facility];  Service: General;  Laterality: N/A;  . LUMBAR LAMINECTOMY    . TONSILLECTOMY AND ADENOIDECTOMY    . TOTAL HIP ARTHROPLASTY  2005   L    Allergies  Allergen Reactions  . Latex Rash  . Tape Rash    Paper tape is ok to use     Allergies as of 12/20/2019      Reactions   Latex Rash   Tape Rash   Paper tape is ok to use      Medication List       Accurate as of December 20, 2019 11:07 AM. If you have any questions, ask your nurse or doctor.        acetaminophen 500 MG tablet Commonly known as: TYLENOL Take 1,000 mg by mouth 3 (three) times daily. And q6hprn for pain/fever   aspirin 81  MG tablet Take 81 mg by mouth every morning.   atorvastatin 20 MG tablet Commonly known as: LIPITOR Take 20 mg by mouth daily.   B-complex with vitamin C tablet Take 1 tablet by mouth daily.   bimatoprost 0.01 % Soln Commonly known as: LUMIGAN Place 1 drop into both eyes at bedtime.   buPROPion 300 MG 24 hr tablet Commonly known as: WELLBUTRIN XL Take 300 mg by mouth daily. per psychiatry recommendation   clonazePAM 0.5 MG tablet Commonly known as: KLONOPIN Take 1 tablet (0.5 mg total) by mouth at bedtime.   erythromycin ophthalmic ointment Place 1 application into both eyes at bedtime.   guaiFENesin 600 MG 12 hr tablet Commonly known as: MUCINEX Take by mouth 2 (two) times daily.   ICAPS PO Take 1 tablet by mouth daily.   ketoconazole 2 % cream Commonly known as: NIZORAL Apply 1 application topically 2 (two) times daily as needed for irritation. Apply to buttocks.   lactose free nutrition Liqd Take 237 mLs by mouth See admin instructions. 2 BID Between meals, 1 Boost milkshake with QHS snack   levofloxacin 500 MG tablet Commonly known as: LEVAQUIN Take 500 mg by mouth daily.   levothyroxine 50 MCG tablet Commonly known as: SYNTHROID Take  25 mcg by mouth. On Sundays   levothyroxine 50 MCG tablet Commonly known as: SYNTHROID Take 50 mcg by mouth See admin instructions. Take daily except for Sundays 1/2 Tablet = 45mcg.   linaclotide 145 MCG Caps capsule Commonly known as: LINZESS Take 290 mcg by mouth daily before breakfast.   loratadine 10 MG tablet Commonly known as: CLARITIN Take 10 mg by mouth daily.   melatonin 3 MG Tabs tablet Take 3 mg by mouth at bedtime.   mirtazapine 30 MG tablet Commonly known as: REMERON Take 60 mg by mouth at bedtime.   polyethylene glycol 17 g packet Commonly known as: MIRALAX / GLYCOLAX Take 17 g by mouth daily. Hold for loose stool   QUEtiapine 25 MG tablet Commonly known as: SEROQUEL Take 25 mg by mouth every morning.   QUEtiapine 50 MG tablet Commonly known as: SEROQUEL Take 50 mg by mouth daily.   Robafen DM Cgh/Chest Congest 10-100 MG/5ML liquid Generic drug: dextromethorphan-guaiFENesin Take 10 mLs by mouth every 6 (six) hours as needed for cough.   saccharomyces boulardii 250 MG capsule Commonly known as: FLORASTOR Take 250 mg by mouth 2 (two) times daily.   sertraline 100 MG tablet Commonly known as: ZOLOFT Take 200 mg by mouth every morning. In the morning. 200mg  in Total.   Systane 0.4-0.3 % Soln Generic drug: Polyethyl Glycol-Propyl Glycol Apply 1 drop to eye 4 (four) times daily.   tamsulosin 0.4 MG Caps capsule Commonly known as: FLOMAX Take 0.4 mg by mouth daily after supper.   topiramate 25 MG tablet Commonly known as: TOPAMAX Take 25 mg by mouth 2 (two) times daily.   Vitamin D3 25 MCG (1000 UT) Caps Take 1,000 Units by mouth daily.   zinc oxide 20 % ointment Apply 1 application topically as needed for irritation.       Review of Systems  Constitutional: Positive for activity change and appetite change.  HENT: Negative.   Respiratory: Positive for cough and shortness of breath.   Cardiovascular: Negative.   Gastrointestinal: Negative.    Genitourinary: Negative.   Musculoskeletal: Positive for gait problem.  Skin: Positive for wound.  Neurological: Positive for weakness.  Psychiatric/Behavioral: Positive for confusion.    Immunization History  Administered Date(s) Administered  . DTaP 11/05/2005  . Hepatitis B 1932-08-03  . Influenza Split 11/12/2011, 11/29/2013  . Influenza Whole 10/30/2007, 11/12/2008, 11/26/2009  . Influenza, High Dose Seasonal PF 12/31/2015, 11/24/2016, 11/30/2018  . Influenza,inj,quad, With Preservative 10/16/2016  . Influenza-Unspecified 11/28/2017, 11/20/2019  . Moderna SARS-COVID-2 Vaccination 02/19/2019, 03/19/2019  . PPD Test 06/23/2011  . Pneumococcal Conjugate-13 01/14/2016  . Pneumococcal Polysaccharide-23 02/16/1999  . Tdap 01/13/2017  . Varicella 11/09/2005  . Zoster 08/30/2006   Pertinent  Health Maintenance Due  Topic Date Due  . INFLUENZA VACCINE  Completed  . PNA vac Low Risk Adult  Completed   Fall Risk  10/28/2017 10/27/2016 06/09/2016 02/03/2016 10/28/2015  Falls in the past year? Yes Yes Yes Yes Yes  Number falls in past yr: 1 2 or more 2 or more 2 or more 2 or more  Comment - - - 2 falls in last 2 months -  Injury with Fall? Yes Yes No Yes No  Comment - L hip - scraps and bruises -  Risk Factor Category  - - - - -  Risk for fall due to : - - - - Impaired balance/gait  Follow up - - - - -   Functional Status Survey:    Vitals:   12/20/19 1049  BP: 129/69  Pulse: 74  Resp: 20  Temp: 99.1 F (37.3 C)  SpO2: 94%  Weight: 142 lb 4.8 oz (64.5 kg)  Height: 6' (1.829 m)   Body mass index is 19.3 kg/m. Physical Exam Vitals reviewed.  Constitutional:      Comments: More Sleepy  HENT:     Head: Normocephalic.     Nose: Nose normal.     Mouth/Throat:     Mouth: Mucous membranes are moist.     Pharynx: Oropharynx is clear.  Eyes:     Pupils: Pupils are equal, round, and reactive to light.  Cardiovascular:     Rate and Rhythm: Normal rate and regular  rhythm.     Pulses: Normal pulses.  Pulmonary:     Effort: Pulmonary effort is normal.     Breath sounds: Normal breath sounds. No wheezing or rales.  Abdominal:     General: Abdomen is flat. Bowel sounds are normal.     Palpations: Abdomen is soft.  Musculoskeletal:        General: No swelling.     Cervical back: Neck supple.  Skin:    General: Skin is warm.     Comments: Has Stage 3 wound in his Sacral area with redness around it  Neurological:     General: No focal deficit present.     Mental Status: He is alert.  Psychiatric:        Mood and Affect: Mood normal.     Labs reviewed: Recent Labs    04/26/19 0320 04/26/19 0320 04/26/19 0534 04/26/19 0534 04/27/19 0504 04/27/19 0504 08/23/19 0000 11/19/19 0000 12/13/19 0000  NA 143   < > 143   < > 141  --  142 142 141  K 3.7   < > 3.4*   < > 4.4   < > 4.1 4.1 4.1  CL 109   < > 114*   < > 110   < > 111* 111* 107  CO2  --   --  23   < > 22   < > 26* 24* 27*  GLUCOSE 115*  --  114*  --  95  --   --   --   --  BUN 36*   < > 38*   < > 36*  --  31* 34* 26*  CREATININE 1.10   < > 1.04   < > 1.07  --  1.0 1.2 1.1  CALCIUM  --   --  8.2*   < > 8.3*   < > 8.5* 8.8 8.7   < > = values in this interval not displayed.   Recent Labs    04/26/19 0534 04/26/19 0534 04/27/19 0504 04/27/19 0504 08/23/19 0000 11/19/19 0000 12/13/19 0000  AST 24   < > 22   < > 15 17 23   ALT 20   < > 23   < > 12 12 18   ALKPHOS 36*   < > 34*   < > 43 43 46  BILITOT 0.6  --  0.6  --   --   --   --   PROT 5.4*  --  5.4*  --   --   --   --   ALBUMIN 2.8*   < > 2.5*  --  3.1*  --  3.5   < > = values in this interval not displayed.   Recent Labs    04/26/19 0128 04/26/19 0320 04/26/19 0534 04/26/19 0534 04/27/19 0504 04/27/19 0504 08/23/19 0000 11/19/19 0000 12/13/19 0000  WBC 3.2*   < > 2.8*   < > 4.3  --  5.5 4.7 4.4  NEUTROABS 2.3   < > 1.9   < > 2.6  --  3,894  --  3,001.00  HGB 15.5   < > 11.9*   < > 12.2*   < > 13.0* 12.9* 14.0    HCT 50.0   < > 38.4*   < > 38.3*   < > 40* 39* 43  MCV 96.9  --  97.5  --  95.0  --   --   --   --   PLT 139*   < > 123*   < > 132*   < > 137* 135* 129*   < > = values in this interval not displayed.   Lab Results  Component Value Date   TSH 2.04 11/19/2019   Lab Results  Component Value Date   HGBA1C 5 01/26/2018   Lab Results  Component Value Date   CHOL 136 11/19/2019   HDL 42 11/19/2019   LDLCALC 78 11/19/2019   LDLDIRECT 132.5 09/08/2012   TRIG 79 11/19/2019   CHOLHDL 2.7 11/15/2016    Significant Diagnostic Results in last 30 days:  No results found.  Assessment/Plan Pneumonia of left lower lobe due to infectious organism Levaquin 500 mg QD for 7 days  Major psychotic depression, recurrent (HCC) Change Seroquel to 37.5 mg at night Continue 25 mg in am See if ti helps with his sleepiness Pressure Wound Unstageble Will use Foam dressing right now surrounding Inc D/W the daughter if he does not turn around will get hospice consult  Other issues  Chronic constipation  Linzess to 290 mcg Hallucinations, visual Well controlled on  Seroquel Will Like to taper Seroquel eventually MRI was negative for any acute Changes Just Moderate Atrophy with Chronic Infarcts   Major psychotic depression, recurrent (HCC) Continues on Zoloft , Remeron,Wellbutrin and Klonipin Weight loss Weight has been stable since Topamax reduced Stage 3 chronic kidney disease, unspecified whether stage 3a or 3b CKD Creatinine stable Hyperlipidemia On Lipitor Hand tremors Doing okay on lower dose of Topamax  Family/ staff Communication:   Labs/tests ordered:

## 2019-12-21 ENCOUNTER — Non-Acute Institutional Stay (SKILLED_NURSING_FACILITY): Payer: Medicare Other | Admitting: Nurse Practitioner

## 2019-12-21 ENCOUNTER — Encounter: Payer: Self-pay | Admitting: Nurse Practitioner

## 2019-12-21 DIAGNOSIS — G250 Essential tremor: Secondary | ICD-10-CM

## 2019-12-21 DIAGNOSIS — M25561 Pain in right knee: Secondary | ICD-10-CM | POA: Diagnosis not present

## 2019-12-21 DIAGNOSIS — K5909 Other constipation: Secondary | ICD-10-CM

## 2019-12-21 DIAGNOSIS — J9621 Acute and chronic respiratory failure with hypoxia: Secondary | ICD-10-CM | POA: Diagnosis not present

## 2019-12-21 DIAGNOSIS — E039 Hypothyroidism, unspecified: Secondary | ICD-10-CM

## 2019-12-21 DIAGNOSIS — N3941 Urge incontinence: Secondary | ICD-10-CM

## 2019-12-21 DIAGNOSIS — R531 Weakness: Secondary | ICD-10-CM

## 2019-12-21 DIAGNOSIS — G8929 Other chronic pain: Secondary | ICD-10-CM

## 2019-12-21 DIAGNOSIS — F333 Major depressive disorder, recurrent, severe with psychotic symptoms: Secondary | ICD-10-CM

## 2019-12-21 NOTE — Assessment & Plan Note (Signed)
PNA,, left lower lobe,  started Levaquin, O2 via [place], tachypnea present. The patient and his HPOA desires Hospice consultation, no hospitalization, IVF, or further diagnostic testing. Will have prn Morphine for respiratory distress

## 2019-12-21 NOTE — Assessment & Plan Note (Signed)
Continue Tylenol if tolerated.

## 2019-12-21 NOTE — Assessment & Plan Note (Signed)
stable, on Levothyroxine 86mcg qd 6x/wk, 54mcg qd 1x/wk.TSH 2.04 11/19/19

## 2019-12-21 NOTE — Assessment & Plan Note (Signed)
Psychotic featured depression/anxiety/delusion/hallucinatio,will continue if tolerated: Sertralien200mg  qd, Mirtazapine 60mg  qd, Clonazepam 0.5mg  qhs, Bupropion 300mg  qd,  Quetiapine 25mg  qam, 50mg  qd, MRI brainunremarkable.

## 2019-12-21 NOTE — Assessment & Plan Note (Signed)
stable, on Linzess qd, MiraLax qd.

## 2019-12-21 NOTE — Assessment & Plan Note (Signed)
Worse, taking Topamax.

## 2019-12-21 NOTE — Progress Notes (Signed)
Location:    [facility] Room Number: 37 Place of Service:  SNF (31) Provider: Lennie Odor Natalee Tomkiewicz NP  [name], MD  Patient Care Team: [name], MD as PCP - General (Internal Medicine) [name], MD as Consulting Physician (Urology) [name], MD as Consulting Physician (Neurology) [name], MD as Consulting Physician (Dermatology) [name], MD (Inactive) as Consulting Physician (Gastroenterology) [name], MD as Consulting Physician (Orthopedic Surgery) [name], MD as Consulting Physician (Cardiology) [name], MD as Consulting Physician (Ophthalmology) [name], MD as Referring Physician (Psychiatry) [name], MD as Consulting Physician (General Surgery) [name], MD as Consulting Physician (Orthopedic Surgery) Hetland, [name], NP as Nurse Practitioner (Family Medicine) Whittney Steenson X, NP as Nurse Practitioner (Internal Medicine)  Extended Emergency Contact Information Primary Emergency Contact: Diantonio,[name] Address: [address]          [place], [place] [address] [place] Phone: [phone] Relation: Spouse Secondary Emergency Contact: Durwin Glaze Mobile Phone: [phone] Relation: Daughter  Code Status:  DNR Goals of care: Advanced Directive information Advanced Directives 11/15/2019  Does Patient Have a Medical Advance Directive? Yes  Type of Advance Directive Out of facility DNR (pink MOST or yellow form);Living will;Healthcare Power of Attorney  Does patient want to make changes to medical advance directive? No - Patient declined  Copy of [place] in Chart? Yes - validated most recent copy scanned in chart (See row information)  Pre-existing out of facility DNR order (yellow form or pink MOST form) Yellow form placed in chart (order not valid for inpatient use);Pink MOST form placed in chart (order not valid  for inpatient use)     Chief Complaint  Patient presents with  . Acute Visit    Tremulous, O2 desaturation, generalized weakness    HPI:  Pt is a 86 y.o. male seen today for an acute visit for tremulous allover on and off, generalized weakness, limited oral intake, dehydrated appearance. PNA,, left lower lobe,  started Levaquin, O2 via . The patient and his HPOA desires Hospice consultation, no hospitalization, IVF, or further diagnostic testing.   Psychotic featured depression/anxiety/delusion/hallucinatio,takesSertralien200mg  qd, Mirtazapine 60mg  qd, Clonazepam 0.5mg  qhs, Bupropion 300mg  qd, Abilifyoff, Quetiapine 25mg  qam, 50mg  qd, MRI brainunremarkable. Hx of OA, stable, on Tylenol 1000mg  tid. Hypothyroidism, stable, on Levothyroxine 68mcg qd 6x/wk, 19mcg qd 1x/wk.TSH 2.04 11/19/19 Constipation, stable, on Linzess qd, MiraLax qd.  BPH, stable, on Tamsulosin.  Essential tremor, on Topamax BID.  Rhinitis, takes Loratadine 10mg  qd. PAD, moderate disease bilaterally   Past Medical History:  Diagnosis Date  . Acute urinary retention 01/28/2013  . AKI (acute kidney injury) ([place]) 01/26/2013  . Altered mental status 09/09/2013  . Anemia    B12 deficiency  . ANXIETY 06/07/2007   Qualifier: Diagnosis of  By: Talbert Cage CMA ([place]), June    . ARTHRITIS 06/07/2007   Qualifier: Diagnosis of  By: Talbert Cage CMA ([place]), June    . Balance problems 04/05/2011  . Benign essential tremor   . Benign fibroma of prostate 10/11/2011  . Bilateral inguinal hernia 10/09/2012  . Bilateral leg edema 09/13/2012  . Bladder retention 02/05/2013  . Bradycardia, sinus 08/02/2012  . CAROTID ARTERY DISEASE 03/31/2007   Qualifier: Diagnosis of  By: Linna Darner MD, Gwyndolyn Saxon    . Carotid stenosis    s/p L CEA  . Cataract, nuclear 03/21/2014  .  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 Cervical spinal stenosis   . Cervical stenosis of  spine   . Critical lower limb ischemia ([place])   . Degeneration macular 11/02/2011  . Depression    Dr Albertine Patricia  . Difficulty in walking(719.7) 04/05/2011  . ESOPHAGITIS 08/06/2002   Qualifier: Diagnosis of  By: Talbert Cage CMA ([place]), June    . Essential and other specified forms of tremor 04/18/2013  . Fall at home 01/06/2017   left hip pain  . Falls   . Family history of colon cancer 07/24/2012  . Fecal impaction ([place]) 10/11/2012  . Femoral hernia, bilateral s/p lap repair 01/16/2013 01/16/2013  . Glaucoma, compensated 03/21/2014  . H/O cardiovascular stress test    a. 02/2003 -> no ischemia/infarct.  Marland Kitchen HAMMER TOE 04/23/2010   Qualifier: Diagnosis of  By: Linna Darner MD, Gwyndolyn Saxon    . Hematoma of leg   . Hip hematoma, right 09/09/2013  . History of shingles 2009  . HTN (hypertension) 08/22/2012  . Hx of echocardiogram    Echocardiogram 08/01/12: Mild LVH, EF 55-60%, normal wall motion.  . Hydrocele 10/11/2011  . Hyperlipidemia   . 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 Hypothyroidism   . Macular degeneration disease   . Macular edema   . Migraines    Dr Jannifer Franklin  . MORTON'S NEUROMA, LEFT 08/22/2007   Qualifier: Diagnosis of  By: Linna Darner MD, Gwyndolyn Saxon    . PAD (peripheral artery disease) ([place])   . Peripheral neuropathy   . Personal history of colonic polyps 01/05/2013   2014 2 mm adenomatous polyp   . PREMATURE ATRIAL CONTRACTIONS 03/14/2006   Qualifier: Diagnosis of  By: Linna Darner MD, Gwendolyn Lima 12/16/2011  . SPINAL STENOSIS 03/14/2006   Qualifier: Diagnosis of  By: Linna Darner MD, William   Cervical spine    . Testosterone deficiency    Dr Lawerance Bach, [facility]  . Unspecified vitamin D deficiency 08/06/2012  . Urge incontinence 10/09/2012  . Urinary tract infection, site not specified 02/13/2013  . Weakness 08/02/2012   Past Surgical History:  Procedure Laterality Date  . APPENDECTOMY  1941  . BREAST CYST EXCISION Left 01/16/2013   Procedure: MASS EXCISION AXILLA;  Surgeon: Adin Hector, MD;   Location: [facility];  Service: General;  Laterality: Left;  . CAROTID ENDARTERECTOMY  2005    L  . CATARACT EXTRACTION Right   . COLONOSCOPY  2014   Dr. Deatra Ina  . EYE SURGERY Right 02/2012   retina  . INGUINAL HERNIA REPAIR Bilateral 01/16/2013   Procedure: LAPAROSCOPIC BILATERAL INGUINAL DIRECT AND INDIRECT, FEMORAL, AND OBTURATOR HERNIAS;  Surgeon: Adin Hector, MD;  Location: [facility];  Service: General;  Laterality: Bilateral;  . INSERTION OF MESH N/A 01/16/2013   Procedure: INSERTION OF MESH;  Surgeon: Adin Hector, MD;  Location: [facility];  Service: General;  Laterality: N/A;  . LUMBAR LAMINECTOMY    . TONSILLECTOMY AND ADENOIDECTOMY    . TOTAL HIP ARTHROPLASTY  2005   L    Allergies  Allergen Reactions  . Latex Rash  . Tape Rash    Paper tape is ok to use     Allergies as of 12/21/2019      Reactions   Latex Rash   Tape Rash   Paper tape is ok to use      Medication List       Accurate as of December 21, 2019  4:42 PM. If you have any questions, ask your nurse or doctor.  acetaminophen 500 MG tablet Commonly known as: TYLENOL Take 1,000 mg by mouth 3 (three) times daily. And q6hprn for pain/fever   aspirin 81 MG tablet Take 81 mg by mouth every morning.   atorvastatin 20 MG tablet Commonly known as: LIPITOR Take 20 mg by mouth daily.   B-complex with vitamin C tablet Take 1 tablet by mouth daily.   bimatoprost 0.01 % Soln Commonly known as: LUMIGAN Place 1 drop into both eyes at bedtime.   buPROPion 300 MG 24 hr tablet Commonly known as: WELLBUTRIN XL Take 300 mg by mouth daily. per psychiatry recommendation   clonazePAM 0.5 MG tablet Commonly known as: KLONOPIN Take 1 tablet (0.5 mg total) by mouth at bedtime.   erythromycin ophthalmic ointment Place 1 application into both eyes at bedtime.   guaiFENesin 600 MG 12 hr tablet Commonly known as: MUCINEX Take by mouth 2 (two) times daily.   ICAPS PO Take 1 tablet by mouth daily.     ketoconazole 2 % cream Commonly known as: NIZORAL Apply 1 application topically 2 (two) times daily as needed for irritation. Apply to buttocks.   lactose free nutrition Liqd Take 237 mLs by mouth See admin instructions. 2 BID Between meals, 1 Boost milkshake with QHS snack   levofloxacin 500 MG tablet Commonly known as: LEVAQUIN Take 500 mg by mouth daily.   levothyroxine 50 MCG tablet Commonly known as: SYNTHROID Take 25 mcg by mouth. On Sundays   levothyroxine 50 MCG tablet Commonly known as: SYNTHROID Take 50 mcg by mouth See admin instructions. Take daily except for Sundays 1/2 Tablet = 24mcg.   linaclotide 145 MCG Caps capsule Commonly known as: LINZESS Take 290 mcg by mouth daily before breakfast.   loratadine 10 MG tablet Commonly known as: CLARITIN Take 10 mg by mouth daily.   LORazepam 0.5 MG tablet Commonly known as: ATIVAN Take 0.5 mg by mouth every 2 (two) hours as needed for anxiety.   melatonin 3 MG Tabs tablet Take 3 mg by mouth at bedtime.   mirtazapine 30 MG tablet Commonly known as: REMERON Take 60 mg by mouth at bedtime.   morphine 20 MG/5ML solution Take 5 mg by mouth every 2 (two) hours as needed for pain.   nystatin cream Commonly known as: MYCOSTATIN Apply 1 application topically 2 (two) times daily.   polyethylene glycol 17 g packet Commonly known as: MIRALAX / GLYCOLAX Take 17 g by mouth daily. Hold for loose stool   QUEtiapine 25 MG tablet Commonly known as: SEROQUEL Take 25 mg by mouth every morning.   QUEtiapine 50 MG tablet Commonly known as: SEROQUEL Take 50 mg by mouth daily.   Robafen DM Cgh/Chest Congest 10-100 MG/5ML liquid Generic drug: dextromethorphan-guaiFENesin Take 10 mLs by mouth every 6 (six) hours as needed for cough.   saccharomyces boulardii 250 MG capsule Commonly known as: FLORASTOR Take 250 mg by mouth 2 (two) times daily.   sertraline 100 MG tablet Commonly known as: ZOLOFT Take 200 mg by mouth  every morning. In the morning. 200mg  in Total.   Systane 0.4-0.3 % Soln Generic drug: Polyethyl Glycol-Propyl Glycol Apply 1 drop to eye 4 (four) times daily.   tamsulosin 0.4 MG Caps capsule Commonly known as: FLOMAX Take 0.4 mg by mouth daily after supper.   topiramate 25 MG tablet Commonly known as: TOPAMAX Take 25 mg by mouth 2 (two) times daily.   Vitamin D3 25 MCG (1000 UT) Caps Take 1,000 Units by mouth daily.  zinc oxide 20 % ointment Apply 1 application topically as needed for irritation.       Review of Systems  Constitutional: Positive for activity change, appetite change and fatigue. Negative for chills, diaphoresis and fever.  HENT: Positive for congestion, hearing loss and rhinorrhea. Negative for sinus pressure, sinus pain, sore throat and voice change.   Eyes: Positive for visual disturbance.  Respiratory: Positive for cough and shortness of breath. Negative for chest tightness.   Cardiovascular: Negative for leg swelling.  Gastrointestinal: Negative for abdominal pain and constipation.  Genitourinary: Negative for difficulty urinating and dysuria.       Incontinent of urine.   Musculoskeletal: Positive for arthralgias and gait problem.  Skin: Negative for color change.  Neurological: Positive for tremors. Negative for speech difficulty and light-headedness.       Memory lapses. Fine tremor in fingers at rest, tremulous head and limbs with movement.   Psychiatric/Behavioral: Positive for hallucinations. Negative for behavioral problems and sleep disturbance. The patient is nervous/anxious.        Delusions, anxiety, hallucination improved    Immunization History  Administered Date(s) Administered  . DTaP 11/05/2005  . Hepatitis B 1932-06-26  . Influenza Split 11/12/2011, 11/29/2013  . Influenza Whole 10/30/2007, 11/12/2008, 11/26/2009  . Influenza, High Dose Seasonal PF 12/31/2015, 11/24/2016, 11/30/2018  . Influenza,inj,quad, With Preservative  10/16/2016  . Influenza-Unspecified 11/28/2017, 11/20/2019  . Moderna SARS-COVID-2 Vaccination 02/19/2019, 03/19/2019  . PPD Test 06/23/2011  . Pneumococcal Conjugate-13 01/14/2016  . Pneumococcal Polysaccharide-23 02/16/1999  . Tdap 01/13/2017  . Varicella 11/09/2005  . Zoster 08/30/2006   Pertinent  Health Maintenance Due  Topic Date Due  . INFLUENZA VACCINE  Completed  . PNA vac Low Risk Adult  Completed   Fall Risk  10/28/2017 10/27/2016 06/09/2016 02/03/2016 10/28/2015  Falls in the past year? Yes Yes Yes Yes Yes  Number falls in past yr: 1 2 or more 2 or more 2 or more 2 or more  Comment - - - 2 falls in last 2 months -  Injury with Fall? Yes Yes No Yes No  Comment - L hip - scraps and bruises -  Risk Factor Category  - - - - -  Risk for fall due to : - - - - Impaired balance/gait  Follow up - - - - -   Functional Status Survey:    Vitals:   12/21/19 1551  BP: (!) 99/57  Pulse: 75  Resp: (!) 21  Temp: 98.3 F (36.8 C)  SpO2: 91%  Weight: 142 lb 4.8 oz (64.5 kg)  Height: 6' (1.829 m)   Body mass index is 19.3 kg/m. Physical Exam Vitals and nursing note reviewed.  Constitutional:      Comments: Exhausted appearance.  HENT:     Head: Normocephalic and atraumatic.     Nose: Congestion and rhinorrhea present.     Mouth/Throat:     Mouth: Mucous membranes are dry.  Eyes:     Extraocular Movements: Extraocular movements intact.     Conjunctiva/sclera: Conjunctivae normal.     Pupils: Pupils are equal, round, and reactive to light.     Comments: Left eye blind.   Cardiovascular:     Rate and Rhythm: Normal rate and regular rhythm.     Heart sounds: No murmur heard.   Pulmonary:     Breath sounds: Rhonchi and rales present. No wheezing.     Comments: Bibasilar rales. Scattered rhonchi. Tachypnea. O2 via [place] Abdominal:     General:  Bowel sounds are normal.     Palpations: Abdomen is soft.     Tenderness: There is no abdominal tenderness.  Musculoskeletal:      Cervical back: Normal range of motion and neck supple.     Right lower leg: No edema.     Left lower leg: No edema.  Skin:    General: Skin is warm and dry.  Neurological:     General: No focal deficit present.     Mental Status: He is alert. Mental status is at baseline.     Comments: Oriented to person, place. Fine, mild resting tremor in fingers at rest, tremulous in head/limbs with movement.   Psychiatric:     Comments: Stated he doesn't want to go to hospital, desires comfort care at [facility].      Labs reviewed: Recent Labs    04/26/19 0320 04/26/19 0320 04/26/19 0534 04/26/19 0534 04/27/19 0504 04/27/19 0504 08/23/19 0000 11/19/19 0000 12/13/19 0000  NA 143   < > 143   < > 141  --  142 142 141  K 3.7   < > 3.4*   < > 4.4   < > 4.1 4.1 4.1  CL 109   < > 114*   < > 110   < > 111* 111* 107  CO2  --   --  23   < > 22   < > 26* 24* 27*  GLUCOSE 115*  --  114*  --  95  --   --   --   --   BUN 36*   < > 38*   < > 36*  --  31* 34* 26*  CREATININE 1.10   < > 1.04   < > 1.07  --  1.0 1.2 1.1  CALCIUM  --   --  8.2*   < > 8.3*   < > 8.5* 8.8 8.7   < > = values in this interval not displayed.   Recent Labs    04/26/19 0534 04/26/19 0534 04/27/19 0504 04/27/19 0504 08/23/19 0000 11/19/19 0000 12/13/19 0000  AST 24   < > 22   < > 15 17 23   ALT 20   < > 23   < > 12 12 18   ALKPHOS 36*   < > 34*   < > 43 43 46  BILITOT 0.6  --  0.6  --   --   --   --   PROT 5.4*  --  5.4*  --   --   --   --   ALBUMIN 2.8*   < > 2.5*  --  3.1*  --  3.5   < > = values in this interval not displayed.   Recent Labs    04/26/19 0128 04/26/19 0320 04/26/19 0534 04/26/19 0534 04/27/19 0504 04/27/19 0504 08/23/19 0000 11/19/19 0000 12/13/19 0000  WBC 3.2*   < > 2.8*   < > 4.3  --  5.5 4.7 4.4  NEUTROABS 2.3   < > 1.9   < > 2.6  --  3,894  --  3,001.00  HGB 15.5   < > 11.9*   < > 12.2*   < > 13.0* 12.9* 14.0  HCT 50.0   < > 38.4*   < > 38.3*   < > 40* 39* 43  MCV 96.9  --  97.5   --  95.0  --   --   --   --  PLT 139*   < > 123*   < > 132*   < > 137* 135* 129*   < > = values in this interval not displayed.   Lab Results  Component Value Date   TSH 2.04 11/19/2019   Lab Results  Component Value Date   HGBA1C 5 01/26/2018   Lab Results  Component Value Date   CHOL 136 11/19/2019   HDL 42 11/19/2019   LDLCALC 78 11/19/2019   LDLDIRECT 132.5 09/08/2012   TRIG 79 11/19/2019   CHOLHDL 2.7 11/15/2016    Significant Diagnostic Results in last 30 days:  No results found.  Assessment/Plan Acute on chronic respiratory failure with hypoxemia (HCC) PNA,, left lower lobe,  started Levaquin, O2 via [place], tachypnea present. The patient and his HPOA desires Hospice consultation, no hospitalization, IVF, or further diagnostic testing. Will have prn Morphine for respiratory distress   Generalized weakness tremulous allover on and off with limb or head movement, generalized weakness, limited oral intake, dehydrated appearance. Hx of essential tremor, takes Topamx. Will have prn Lorazepam available to him.   Major psychotic depression, recurrent ([place]) Psychotic featured depression/anxiety/delusion/hallucinatio,will continue if tolerated: Sertralien200mg  qd, Mirtazapine 60mg  qd, Clonazepam 0.5mg  qhs, Bupropion 300mg  qd,  Quetiapine 25mg  qam, 50mg  qd, MRI brainunremarkable.   Knee pain, right Continue Tylenol if tolerated.   Hypothyroidism stable, on Levothyroxine 48mcg qd 6x/wk, 17mcg qd 1x/wk.TSH 2.04 11/19/19   Chronic constipation stable, on Linzess qd, MiraLax qd.    Benign essential tremor Worse, taking Topamax.   Urge incontinence on Tamsulosin if tolerated.    Family/ staff Communication: plan of care reviewed with the patient, the patient's wife and daughter, and charge nurse.   Labs/tests ordered:  None  Time spend 35 minutes.

## 2019-12-21 NOTE — Assessment & Plan Note (Signed)
on Tamsulosin if tolerated.

## 2019-12-21 NOTE — Assessment & Plan Note (Signed)
tremulous allover on and off with limb or head movement, generalized weakness, limited oral intake, dehydrated appearance. Hx of essential tremor, takes Topamx. Will have prn Lorazepam available to him.

## 2019-12-23 ENCOUNTER — Telehealth: Payer: Self-pay | Admitting: Family

## 2019-12-23 ENCOUNTER — Inpatient Hospital Stay (HOSPITAL_COMMUNITY)
Admission: EM | Admit: 2019-12-23 | Discharge: 2020-01-16 | DRG: 951 | Disposition: E | Source: Skilled Nursing Facility | Attending: Internal Medicine | Admitting: Internal Medicine

## 2019-12-23 ENCOUNTER — Other Ambulatory Visit: Payer: Self-pay

## 2019-12-23 ENCOUNTER — Encounter (HOSPITAL_COMMUNITY): Payer: Self-pay | Admitting: Emergency Medicine

## 2019-12-23 ENCOUNTER — Emergency Department (HOSPITAL_COMMUNITY)

## 2019-12-23 DIAGNOSIS — Z800 Family history of malignant neoplasm of digestive organs: Secondary | ICD-10-CM | POA: Diagnosis not present

## 2019-12-23 DIAGNOSIS — Z79899 Other long term (current) drug therapy: Secondary | ICD-10-CM

## 2019-12-23 DIAGNOSIS — E860 Dehydration: Secondary | ICD-10-CM | POA: Diagnosis present

## 2019-12-23 DIAGNOSIS — Z66 Do not resuscitate: Secondary | ICD-10-CM | POA: Diagnosis present

## 2019-12-23 DIAGNOSIS — J9621 Acute and chronic respiratory failure with hypoxia: Secondary | ICD-10-CM | POA: Diagnosis present

## 2019-12-23 DIAGNOSIS — M1990 Unspecified osteoarthritis, unspecified site: Secondary | ICD-10-CM | POA: Diagnosis present

## 2019-12-23 DIAGNOSIS — Z8249 Family history of ischemic heart disease and other diseases of the circulatory system: Secondary | ICD-10-CM | POA: Diagnosis not present

## 2019-12-23 DIAGNOSIS — I70229 Atherosclerosis of native arteries of extremities with rest pain, unspecified extremity: Secondary | ICD-10-CM | POA: Diagnosis present

## 2019-12-23 DIAGNOSIS — Z823 Family history of stroke: Secondary | ICD-10-CM | POA: Diagnosis not present

## 2019-12-23 DIAGNOSIS — Z79890 Hormone replacement therapy (postmenopausal): Secondary | ICD-10-CM

## 2019-12-23 DIAGNOSIS — Z91048 Other nonmedicinal substance allergy status: Secondary | ICD-10-CM

## 2019-12-23 DIAGNOSIS — Z515 Encounter for palliative care: Secondary | ICD-10-CM | POA: Diagnosis not present

## 2019-12-23 DIAGNOSIS — E039 Hypothyroidism, unspecified: Secondary | ICD-10-CM | POA: Diagnosis present

## 2019-12-23 DIAGNOSIS — J189 Pneumonia, unspecified organism: Secondary | ICD-10-CM | POA: Diagnosis present

## 2019-12-23 DIAGNOSIS — Z833 Family history of diabetes mellitus: Secondary | ICD-10-CM | POA: Diagnosis not present

## 2019-12-23 DIAGNOSIS — D519 Vitamin B12 deficiency anemia, unspecified: Secondary | ICD-10-CM | POA: Diagnosis present

## 2019-12-23 DIAGNOSIS — J9600 Acute respiratory failure, unspecified whether with hypoxia or hypercapnia: Secondary | ICD-10-CM

## 2019-12-23 DIAGNOSIS — G250 Essential tremor: Secondary | ICD-10-CM | POA: Diagnosis present

## 2019-12-23 DIAGNOSIS — G629 Polyneuropathy, unspecified: Secondary | ICD-10-CM | POA: Diagnosis present

## 2019-12-23 DIAGNOSIS — E785 Hyperlipidemia, unspecified: Secondary | ICD-10-CM | POA: Diagnosis present

## 2019-12-23 DIAGNOSIS — H409 Unspecified glaucoma: Secondary | ICD-10-CM | POA: Diagnosis present

## 2019-12-23 DIAGNOSIS — F419 Anxiety disorder, unspecified: Secondary | ICD-10-CM | POA: Diagnosis present

## 2019-12-23 DIAGNOSIS — R0603 Acute respiratory distress: Secondary | ICD-10-CM | POA: Diagnosis not present

## 2019-12-23 DIAGNOSIS — R4182 Altered mental status, unspecified: Secondary | ICD-10-CM | POA: Diagnosis present

## 2019-12-23 DIAGNOSIS — Z87891 Personal history of nicotine dependence: Secondary | ICD-10-CM | POA: Diagnosis not present

## 2019-12-23 DIAGNOSIS — G43909 Migraine, unspecified, not intractable, without status migrainosus: Secondary | ICD-10-CM | POA: Diagnosis present

## 2019-12-23 DIAGNOSIS — N179 Acute kidney failure, unspecified: Secondary | ICD-10-CM | POA: Diagnosis present

## 2019-12-23 DIAGNOSIS — Z96642 Presence of left artificial hip joint: Secondary | ICD-10-CM | POA: Diagnosis present

## 2019-12-23 DIAGNOSIS — F323 Major depressive disorder, single episode, severe with psychotic features: Secondary | ICD-10-CM | POA: Diagnosis present

## 2019-12-23 DIAGNOSIS — U071 COVID-19: Secondary | ICD-10-CM | POA: Diagnosis present

## 2019-12-23 DIAGNOSIS — R64 Cachexia: Secondary | ICD-10-CM | POA: Diagnosis present

## 2019-12-23 DIAGNOSIS — N1831 Chronic kidney disease, stage 3a: Secondary | ICD-10-CM | POA: Diagnosis present

## 2019-12-23 DIAGNOSIS — Z91040 Latex allergy status: Secondary | ICD-10-CM | POA: Diagnosis not present

## 2019-12-23 DIAGNOSIS — Z7982 Long term (current) use of aspirin: Secondary | ICD-10-CM

## 2019-12-23 DIAGNOSIS — D291 Benign neoplasm of prostate: Secondary | ICD-10-CM | POA: Diagnosis present

## 2019-12-23 DIAGNOSIS — J1282 Pneumonia due to coronavirus disease 2019: Secondary | ICD-10-CM | POA: Diagnosis present

## 2019-12-23 DIAGNOSIS — I129 Hypertensive chronic kidney disease with stage 1 through stage 4 chronic kidney disease, or unspecified chronic kidney disease: Secondary | ICD-10-CM | POA: Diagnosis present

## 2019-12-23 DIAGNOSIS — Z681 Body mass index (BMI) 19 or less, adult: Secondary | ICD-10-CM

## 2019-12-23 DIAGNOSIS — H3530 Unspecified macular degeneration: Secondary | ICD-10-CM | POA: Diagnosis present

## 2019-12-23 LAB — COMPREHENSIVE METABOLIC PANEL
ALT: 31 U/L (ref 0–44)
AST: 40 U/L (ref 15–41)
Albumin: 1.9 g/dL — ABNORMAL LOW (ref 3.5–5.0)
Alkaline Phosphatase: 47 U/L (ref 38–126)
Anion gap: 17 — ABNORMAL HIGH (ref 5–15)
BUN: 166 mg/dL — ABNORMAL HIGH (ref 8–23)
CO2: 20 mmol/L — ABNORMAL LOW (ref 22–32)
Calcium: 9.1 mg/dL (ref 8.9–10.3)
Chloride: 121 mmol/L — ABNORMAL HIGH (ref 98–111)
Creatinine, Ser: 5.56 mg/dL — ABNORMAL HIGH (ref 0.61–1.24)
GFR, Estimated: 9 mL/min — ABNORMAL LOW (ref >60)
Glucose, Bld: 134 mg/dL — ABNORMAL HIGH (ref 70–99)
Potassium: 4.7 mmol/L (ref 3.5–5.1)
Sodium: 158 mmol/L — ABNORMAL HIGH (ref 135–145)
Total Bilirubin: 0.7 mg/dL (ref 0.3–1.2)
Total Protein: 6.5 g/dL (ref 6.5–8.1)

## 2019-12-23 LAB — CBC WITH DIFFERENTIAL/PLATELET
Abs Immature Granulocytes: 0.06 10*3/uL (ref 0.00–0.07)
Basophils Absolute: 0.1 10*3/uL (ref 0.0–0.1)
Basophils Relative: 1 %
Eosinophils Absolute: 0 10*3/uL (ref 0.0–0.5)
Eosinophils Relative: 0 %
HCT: 45 % (ref 39.0–52.0)
Hemoglobin: 13.7 g/dL (ref 13.0–17.0)
Immature Granulocytes: 1 %
Lymphocytes Relative: 8 %
Lymphs Abs: 0.6 10*3/uL — ABNORMAL LOW (ref 0.7–4.0)
MCH: 30.8 pg (ref 26.0–34.0)
MCHC: 30.4 g/dL (ref 30.0–36.0)
MCV: 101.1 fL — ABNORMAL HIGH (ref 80.0–100.0)
Monocytes Absolute: 0.3 10*3/uL (ref 0.1–1.0)
Monocytes Relative: 3 %
Neutro Abs: 6.7 10*3/uL (ref 1.7–7.7)
Neutrophils Relative %: 87 %
Platelets: ADEQUATE 10*3/uL (ref 150–400)
RBC: 4.45 MIL/uL (ref 4.22–5.81)
RDW: 14.5 % (ref 11.5–15.5)
WBC: 7.7 10*3/uL (ref 4.0–10.5)
nRBC: 0 % (ref 0.0–0.2)

## 2019-12-23 LAB — TROPONIN I (HIGH SENSITIVITY)
Troponin I (High Sensitivity): 233 ng/L (ref <18)
Troponin I (High Sensitivity): 266 ng/L (ref <18)

## 2019-12-23 LAB — BRAIN NATRIURETIC PEPTIDE: B Natriuretic Peptide: 119.7 pg/mL — ABNORMAL HIGH (ref 0.0–100.0)

## 2019-12-23 LAB — I-STAT CHEM 8, ED
BUN: >130 mg/dL — ABNORMAL HIGH (ref 8–23)
Calcium, Ion: 1.11 mmol/L — ABNORMAL LOW (ref 1.15–1.40)
Chloride: 126 mmol/L — ABNORMAL HIGH (ref 98–111)
Creatinine, Ser: 5.6 mg/dL — ABNORMAL HIGH (ref 0.61–1.24)
Glucose, Bld: 123 mg/dL — ABNORMAL HIGH (ref 70–99)
HCT: 43 % (ref 39.0–52.0)
Hemoglobin: 14.6 g/dL (ref 13.0–17.0)
Potassium: 4.5 mmol/L (ref 3.5–5.1)
Sodium: 160 mmol/L — ABNORMAL HIGH (ref 135–145)
TCO2: 19 mmol/L — ABNORMAL LOW (ref 22–32)

## 2019-12-23 LAB — PROTIME-INR
INR: 1.4 — ABNORMAL HIGH (ref 0.8–1.2)
Prothrombin Time: 16.8 seconds — ABNORMAL HIGH (ref 11.4–15.2)

## 2019-12-23 LAB — RESPIRATORY PANEL BY RT PCR (FLU A&B, COVID)
Influenza A by PCR: NEGATIVE
Influenza A by PCR: NEGATIVE
Influenza B by PCR: NEGATIVE
Influenza B by PCR: NEGATIVE
SARS Coronavirus 2 by RT PCR: POSITIVE — AB
SARS Coronavirus 2 by RT PCR: POSITIVE — AB

## 2019-12-23 LAB — LACTIC ACID, PLASMA
Lactic Acid, Venous: 2.1 mmol/L (ref 0.5–1.9)
Lactic Acid, Venous: 2.7 mmol/L (ref 0.5–1.9)

## 2019-12-23 MED ORDER — HALOPERIDOL LACTATE 5 MG/ML IJ SOLN: 0.5000 mg | INTRAMUSCULAR | Status: DC | PRN

## 2019-12-23 MED ORDER — GLYCOPYRROLATE 0.2 MG/ML IJ SOLN: 0.2000 mg | INTRAMUSCULAR | Status: DC | PRN

## 2019-12-23 MED ORDER — MORPHINE 100MG IN NS 100ML (1MG/ML) PREMIX INFUSION
5.0000 mg/h | INTRAVENOUS | Status: DC
  Administered 2019-12-23: 5 mg/h via INTRAVENOUS
  Filled 2019-12-23: qty 100

## 2019-12-23 MED ORDER — SODIUM CHLORIDE 0.9 % IV BOLUS
1000.0000 mL | Freq: Once | INTRAVENOUS | Status: AC
  Administered 2019-12-23: 1000 mL via INTRAVENOUS

## 2019-12-23 MED ORDER — MORPHINE SULFATE (PF) 2 MG/ML IV SOLN
2.0000 mg | Freq: Once | INTRAVENOUS | Status: AC
  Administered 2019-12-23: 2 mg via INTRAVENOUS
  Filled 2019-12-23: qty 1

## 2019-12-23 MED ORDER — SODIUM CHLORIDE 0.9 % IV SOLN: 1.0000 g | INTRAVENOUS | Status: DC

## 2019-12-23 MED ORDER — SODIUM CHLORIDE 0.9 % IV SOLN
2.0000 g | Freq: Once | INTRAVENOUS | Status: AC
  Administered 2019-12-23: 2 g via INTRAVENOUS
  Filled 2019-12-23: qty 2

## 2019-12-23 MED ORDER — HALOPERIDOL 0.5 MG PO TABS
0.5000 mg | ORAL_TABLET | ORAL | Status: DC | PRN
  Filled 2019-12-23: qty 1

## 2019-12-23 MED ORDER — ACETAMINOPHEN 325 MG PO TABS: 650.0000 mg | ORAL_TABLET | Freq: Four times a day (QID) | ORAL | Status: DC | PRN

## 2019-12-23 MED ORDER — MORPHINE SULFATE (PF) 2 MG/ML IV SOLN: 1.0000 mg | INTRAVENOUS | Status: DC | PRN

## 2019-12-23 MED ORDER — POLYETHYL GLYCOL-PROPYL GLYCOL 0.4-0.3 % OP SOLN: 1.0000 [drp] | Freq: Four times a day (QID) | OPHTHALMIC | Status: DC

## 2019-12-23 MED ORDER — ACETAMINOPHEN 650 MG RE SUPP: 650.0000 mg | Freq: Four times a day (QID) | RECTAL | Status: DC | PRN

## 2019-12-23 MED ORDER — LORAZEPAM 2 MG/ML PO CONC: 1.0000 mg | ORAL | Status: DC | PRN

## 2019-12-23 MED ORDER — VANCOMYCIN VARIABLE DOSE PER UNSTABLE RENAL FUNCTION (PHARMACIST DOSING): Status: DC

## 2019-12-23 MED ORDER — VANCOMYCIN HCL 1250 MG/250ML IV SOLN
1250.0000 mg | Freq: Once | INTRAVENOUS | Status: AC
  Administered 2019-12-23: 1250 mg via INTRAVENOUS
  Filled 2019-12-23: qty 250

## 2019-12-23 MED ORDER — LORAZEPAM 2 MG/ML IJ SOLN: 1.0000 mg | INTRAMUSCULAR | Status: DC | PRN

## 2019-12-23 MED ORDER — ACETAMINOPHEN 650 MG RE SUPP
650.0000 mg | Freq: Once | RECTAL | Status: AC
  Administered 2019-12-23: 650 mg via RECTAL
  Filled 2019-12-23: qty 1

## 2019-12-23 MED ORDER — POLYVINYL ALCOHOL 1.4 % OP SOLN
1.0000 [drp] | Freq: Four times a day (QID) | OPHTHALMIC | Status: DC | PRN
  Administered 2019-12-24: 1 [drp] via OPHTHALMIC
  Filled 2019-12-23 (×2): qty 15

## 2019-12-23 MED ORDER — HALOPERIDOL LACTATE 2 MG/ML PO CONC
0.5000 mg | ORAL | Status: DC | PRN
  Filled 2019-12-23: qty 0.3

## 2019-12-23 MED ORDER — ONDANSETRON HCL 4 MG/2ML IJ SOLN: 4.0000 mg | Freq: Four times a day (QID) | INTRAMUSCULAR | Status: DC | PRN

## 2019-12-23 MED ORDER — LORAZEPAM 1 MG PO TABS: 1.0000 mg | ORAL_TABLET | ORAL | Status: DC | PRN

## 2019-12-23 MED ORDER — GLYCOPYRROLATE 1 MG PO TABS
1.0000 mg | ORAL_TABLET | ORAL | Status: DC | PRN
  Filled 2019-12-23: qty 1

## 2019-12-23 MED ORDER — BIOTENE DRY MOUTH MT LIQD: 15.0000 mL | OROMUCOSAL | Status: DC | PRN

## 2019-12-23 MED ORDER — ONDANSETRON 4 MG PO TBDP: 4.0000 mg | ORAL_TABLET | Freq: Four times a day (QID) | ORAL | Status: DC | PRN

## 2019-12-23 NOTE — ED Notes (Signed)
Acuity changed due to pt status change to comfort care.

## 2019-12-23 NOTE — H&P (Signed)
History and Physical    Jose Leach WER:[id number] DOB: 1932/11/30 DOA: 12/22/2019  PCP: [name], MD Consultants:  Rosana Hoes - urology; Jannifer Franklin - neurology; Gioffre - orthopedics; Burt Knack - cardiology; Albertine Patricia - psychiatry  Patient coming from: [place]; NOK: Wife, Linford Quintela, [phone]; Daughter, Beatrix Fetters, [phone]   Chief Complaint:  Worsening PNA  HPI: Jose Leach is a 86 y.o. male with medical history significant of psychotic depression; benign essential tremor; and hypothyroidism presenting with worsening PNA.  The NP at his facility documented on Friday that he had acute on chronic respiratory failure with hypoxia associated with LLL PNA.  He was started on Levaquin.  Note at that time reported desire for hospice consultation with no hospitalization, IVF, or further diagnostic testing.  Morphine was added.  POA appeared to change their mind and send in for evaluation.  I spoke with his daughter - Friday, they thought he was dying with 2 black eyes and turning blue all over.  They didn't think he would make it through the night but he did and hasn't been eating or drinking.  They thought it would be good to give fluids.    ED Course: CAP diagnosed earlier this week and plan was to stay there with hospice and no escalation of care.  He looked rough today and so they decided maybe he could be more comfortable here.  Review of Systems: Unable to perform   COVID Vaccine Status:  Complete  Past Medical History:  Diagnosis Date  . Acute urinary retention 01/28/2013  . AKI (acute kidney injury) ([place]) 01/26/2013  . Altered mental status 09/09/2013  . Anemia    B12 deficiency  . ANXIETY 06/07/2007   Qualifier: Diagnosis of  By: Talbert Cage CMA ([place]), June    . ARTHRITIS 06/07/2007   Qualifier: Diagnosis of  By: Talbert Cage CMA ([place]), June    . Balance problems 04/05/2011  . Benign essential tremor   . Benign fibroma of prostate 10/11/2011  . Bilateral  inguinal hernia 10/09/2012  . Bilateral leg edema 09/13/2012  . Bladder retention 02/05/2013  . Bradycardia, sinus 08/02/2012  . CAROTID ARTERY DISEASE 03/31/2007   Qualifier: Diagnosis of  By: Linna Darner MD, Gwyndolyn Saxon    . Carotid stenosis    s/p L CEA  . Cataract, nuclear 03/21/2014  . 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 Cervical spinal stenosis   . Cervical stenosis of spine   . Critical lower limb ischemia ([place])   . Degeneration macular 11/02/2011  . Depression    Dr Albertine Patricia  . Difficulty in walking(719.7) 04/05/2011  . ESOPHAGITIS 08/06/2002   Qualifier: Diagnosis of  By: Talbert Cage CMA ([place]), June    . Essential and other specified forms of tremor 04/18/2013  . Fall at home 01/06/2017   left hip pain  . Falls   . Family history of colon cancer 07/24/2012  . Fecal impaction ([place]) 10/11/2012  . Femoral hernia, bilateral s/p lap repair 01/16/2013 01/16/2013  . Glaucoma, compensated 03/21/2014  . H/O cardiovascular stress test    a. 02/2003 -> no ischemia/infarct.  Marland Kitchen HAMMER TOE 04/23/2010   Qualifier: Diagnosis of  By: Linna Darner MD, Gwyndolyn Saxon    . Hematoma of leg   . Hip hematoma, right 09/09/2013  . History of shingles 2009  . HTN (hypertension) 08/22/2012  . Hx of echocardiogram    Echocardiogram 08/01/12: Mild LVH, EF 55-60%, normal wall motion.  . Hydrocele 10/11/2011  . Hyperlipidemia   . 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 Hypothyroidism   .  Macular degeneration disease   . Macular edema   . Migraines    Dr Jannifer Franklin  . MORTON'S NEUROMA, LEFT 08/22/2007   Qualifier: Diagnosis of  By: Linna Darner MD, Gwyndolyn Saxon    . PAD (peripheral artery disease) ([place])   . Peripheral neuropathy   . Personal history of colonic polyps 01/05/2013   2014 2 mm adenomatous polyp   . PREMATURE ATRIAL CONTRACTIONS 03/14/2006   Qualifier: Diagnosis of  By: Linna Darner MD, Gwendolyn Lima 12/16/2011  . SPINAL STENOSIS 03/14/2006   Qualifier: Diagnosis of  By: Linna Darner MD, William   Cervical spine    . Testosterone deficiency      Dr Lawerance Bach, [facility]  . Unspecified vitamin D deficiency 08/06/2012  . Urge incontinence 10/09/2012  . Urinary tract infection, site not specified 02/13/2013  . Weakness 08/02/2012    Past Surgical History:  Procedure Laterality Date  . APPENDECTOMY  1941  . BREAST CYST EXCISION Left 01/16/2013   Procedure: MASS EXCISION AXILLA;  Surgeon: Adin Hector, MD;  Location: [place];  Service: General;  Laterality: Left;  . CAROTID ENDARTERECTOMY  2005    L  . CATARACT EXTRACTION Right   . COLONOSCOPY  2014   Dr. Deatra Ina  . EYE SURGERY Right 02/2012   retina  . INGUINAL HERNIA REPAIR Bilateral 01/16/2013   Procedure: LAPAROSCOPIC BILATERAL INGUINAL DIRECT AND INDIRECT, FEMORAL, AND OBTURATOR HERNIAS;  Surgeon: Adin Hector, MD;  Location: [place];  Service: General;  Laterality: Bilateral;  . INSERTION OF MESH N/A 01/16/2013   Procedure: INSERTION OF MESH;  Surgeon: Adin Hector, MD;  Location: [place];  Service: General;  Laterality: N/A;  . LUMBAR LAMINECTOMY    . TONSILLECTOMY AND ADENOIDECTOMY    . TOTAL HIP ARTHROPLASTY  2005   L    Social History   Socioeconomic History  . Marital status: Married    Spouse name: [name]  . Number of children: 2  . Years of education: Not on file  . Highest education level: Not on file  Occupational History  . Occupation: retired     Fish farm manager: RETIRED  Tobacco Use  . Smoking status: Former Smoker    Years: 10.00    Types: Pipe, Cigars    Quit date: 02/16/1979    Years since quitting: 40.8  . Smokeless tobacco: Never Used  . Tobacco comment: Quit about age 82   Vaping Use  . Vaping Use: Never used  Substance and Sexual Activity  . Alcohol use: No    Alcohol/week: 4.0 standard drinks    Types: 4 Standard drinks or equivalent per week  . Drug use: No  . Sexual activity: Never  Other Topics Concern  . Not on file  Social History Narrative   [facility] at [facility] since 2013   Married - [name]   Former smoker -stopped 1981   Alcohol  -wine occasionally    Exercise - walking, Ne-step    POA, Living Will   [employer] with walker   Social Determinants of Health   Financial Resource Strain:   . Difficulty of Paying Living Expenses: Not on file  Food Insecurity:   . Worried About Charity fundraiser in the Last Year: Not on file  . Ran Out of Food in the Last Year: Not on file  Transportation Needs:   . Lack of Transportation (Medical): Not on file  . Lack of Transportation (Non-Medical): Not on file  Physical Activity:   .  Days of Exercise per Week: Not on file  . Minutes of Exercise per Session: Not on file  Stress:   . Feeling of Stress : Not on file  Social Connections:   . Frequency of Communication with Friends and Family: Not on file  . Frequency of Social Gatherings with Friends and Family: Not on file  . Attends Religious Services: Not on file  . Active Member of Clubs or Organizations: Not on file  . Attends Archivist Meetings: Not on file  . Marital Status: Not on file  Intimate Partner Violence:   . Fear of Current or Ex-Partner: Not on file  . Emotionally Abused: Not on file  . Physically Abused: Not on file  . Sexually Abused: Not on file    Allergies  Allergen Reactions  . Latex Rash  . Tape Rash    Paper tape is ok to use     Family History  Problem Relation Age of Onset  . Stroke Mother 39  . Hypertension Mother   . Diabetes Mother   . Heart disease Mother   . Rectal cancer Father 51  . Heart disease Father        in 46s  . Cancer Father        colorectal  . Nephritis Sister   . Seizures Brother        died as child    Prior to Admission medications   Medication Sig Start Date End Date Taking? Authorizing Provider  acetaminophen (TYLENOL) 500 MG tablet Take 1,000 mg by mouth 3 (three) times daily. And q6hprn for pain/fever    [provider]  aspirin 81 MG tablet Take 81 mg by mouth every morning.     [provider]  atorvastatin (LIPITOR) 20 MG  tablet Take 20 mg by mouth daily.    [provider]  B Complex-C (B-COMPLEX WITH VITAMIN C) tablet Take 1 tablet by mouth daily.    [provider]  bimatoprost (LUMIGAN) 0.01 % SOLN Place 1 drop into both eyes at bedtime.    [provider]  buPROPion (WELLBUTRIN XL) 300 MG 24 hr tablet Take 300 mg by mouth daily. per psychiatry recommendation    [provider]  Cholecalciferol (VITAMIN D3) 1000 units CAPS Take 1,000 Units by mouth daily.     [provider]  clonazePAM (KLONOPIN) 0.5 MG tablet Take 1 tablet (0.5 mg total) by mouth at bedtime. 08/22/19   [name], MD  dextromethorphan-guaiFENesin (ROBAFEN DM CGH/CHEST CONGEST) 10-100 MG/5ML liquid Take 10 mLs by mouth every 6 (six) hours as needed for cough.    [provider]  erythromycin ophthalmic ointment Place 1 application into both eyes at bedtime.     [provider]  ketoconazole (NIZORAL) 2 % cream Apply 1 application topically 2 (two) times daily as needed for irritation. Apply to buttocks. 05/15/16   [provider]  lactose free nutrition (BOOST) LIQD Take 237 mLs by mouth See admin instructions. 2 BID Between meals, 1 Boost milkshake with QHS snack    [provider]  levofloxacin (LEVAQUIN) 500 MG tablet Take 500 mg by mouth daily. 12/19/19 12/25/19  [provider]  levothyroxine (SYNTHROID) 50 MCG tablet Take 50 mcg by mouth See admin instructions. Take daily except for Sundays 1/2 Tablet = 35mcg.    [provider]  levothyroxine (SYNTHROID, LEVOTHROID) 50 MCG tablet Take 25 mcg by mouth. On Sundays    [provider]  linaclotide (  LINZESS) 145 MCG CAPS capsule Take 290 mcg by mouth daily before breakfast.    [provider]  loratadine (CLARITIN) 10 MG tablet Take 10 mg by mouth daily.    [provider]  LORazepam (ATIVAN) 0.5 MG tablet Take 0.5 mg by mouth every 2 (two) hours as needed for anxiety.     [provider]  Melatonin 3 MG TABS Take 3 mg by mouth at bedtime.     [provider]  mirtazapine (REMERON) 30 MG tablet Take 60 mg by mouth at bedtime.    [provider]  morphine 20 MG/5ML solution Take 5 mg by mouth every 2 (two) hours as needed for pain.    [provider]  Multiple Vitamins-Minerals (ICAPS PO) Take 1 tablet by mouth daily.    [provider]  nystatin cream (MYCOSTATIN) Apply 1 application topically 2 (two) times daily.    [provider]  Polyethyl Glycol-Propyl Glycol (SYSTANE) 0.4-0.3 % SOLN Apply 1 drop to eye 4 (four) times daily.    [provider]  polyethylene glycol (MIRALAX / GLYCOLAX) packet Take 17 g by mouth daily. Hold for loose stool    [provider]  QUEtiapine (SEROQUEL) 25 MG tablet Take 25 mg by mouth every morning.     [provider]  QUEtiapine (SEROQUEL) 50 MG tablet Take 50 mg by mouth daily.    [provider]  saccharomyces boulardii (FLORASTOR) 250 MG capsule Take 250 mg by mouth 2 (two) times daily. 12/20/19 12/28/19  [provider]  sertraline (ZOLOFT) 100 MG tablet Take 200 mg by mouth every morning. In the morning. 200mg  in Total.    [provider]  tamsulosin (FLOMAX) 0.4 MG CAPS capsule Take 0.4 mg by mouth daily after supper.  05/24/14   [provider]  topiramate (TOPAMAX) 25 MG tablet Take 25 mg by mouth 2 (two) times daily.    [provider]  zinc oxide 20 % ointment Apply 1 application topically as needed for irritation.    [provider]    Physical Exam: Vitals:   01/06/2020 1351 12/22/2019 1358 12/29/2019 1400  BP:  (S) 111/84   Pulse:  (S) 98   Resp:  (S) (!) 27   Temp:  (S) (!) 103.1 F (39.5 C)   TempSrc:  (S) Rectal   SpO2: (!) 79%    Weight:   64.5 kg  Height:   6' (1.829 m)     . General:  Appears gravely ill, on BIPAP; minimally responsive by eye opening and then recloses  them . Eyes: normal lids, iris . ENT: dry mm; BIPAP in place . Neck:  no LAD, masses or thyromegaly . Cardiovascular:  RRR, no m/r/g. No LE edema.  Marland Kitchen Respiratory:  Diffuse rhonchi with gurgling.  Increased respiratory effort. . Abdomen:  soft, NT, ND, NABS . Skin:  no rash or induration seen on limited exam . Musculoskeletal:  no bony abnormality; heel cushions in place . Psychiatric:  Obtunded, unresponsive except as above . Neurologic:  Unable to perform    Radiological Exams on Admission: Independently reviewed - see discussion in A/P where applicable  DG Chest Portable 1 View  Result Date: 12/22/2019 CLINICAL DATA:  86 year old male with history of sepsis. Shortness of breath. Altered mental status. EXAM: PORTABLE CHEST 1 VIEW COMPARISON:  Chest x-ray 04/26/2019. FINDINGS: Patchy airspace consolidation throughout the mid to lower lungs bilaterally. Emphysematous changes. Probable small bilateral pleural effusions. No evidence of pulmonary  edema. Heart size is normal. Upper mediastinal contours are distorted by patient's rotation to the left. IMPRESSION: 1. Multilobar bilateral pneumonia throughout the mid to lower lungs bilaterally with small bilateral pleural effusions. 2. Emphysema. Electronically Signed   By: Vinnie Langton M.D.   On: 12/31/2019 14:07    EKG: not done   Labs on Admission: I have personally reviewed the available labs and imaging studies at the time of the admission.  Pertinent labs:   Na++ 158 Glucose 134 BUN 134/Creatinine 5.56/GFR 9; 26/1.1 on 10/28 Albumin 1.9 Lactate 2.1 WBC 7.7, lymphocyte 0.6 INR 1.4 Troponin 233 COVID positive   Assessment/Plan Active Problems:   * No active hospital problems. *   Acute respiratory failure with hypoxia due to COVID-19 PNA -Patient initially presented at SNF on 11/2 with nasal congestion, cough; CXR ordered and he was given Mucinex -He was seen by Dr. Lyndel Safe on 11/4 and treated with Levaquin  -He was seen  again on 11/5 by NP Mast for generalized weakness and tremors.  At that time, family requested hospice consultation without hospitalization/IVF/further diagnostic testing. -Hospice consultation has not taken place yet and family requested transport today because the patient appeared so uncomfortable. -He was placed on BIPAP on arrival. - He was found to be [place] ([place] has been notified) -The patient has comorbidities which may increase the risk for ARDS/MODS including: age -Exam is concerning for development of ARDS/MODS due to respiratory distress -Pertinent labs concerning for COVID include lymphopenia; markedly increased BUN/Creatinine; other COVID labs were not checked -CXR with multifocal opacities which may be c/w COVID vs. Multifocal PNA -Based on previously stated patient wishes, the patient will be transitioned to comfort care only so that family can spend whatever time he has left with him while he is no longer suffering  End of life care -Patient presenting with severe respiratory failure associated with breakthrough COVID-19 infection -After discussion by telephone after the second positive test confirmed COVID (done at family request), family has decided to proceed with comfort care only -He [facility] to [facility] for comfort care and palliative care consult -Patient is not likely to be a candidate for [facility] or other residential hospice due to the severity of his clinical condition; in-hospital death is expected. -Comfort care order set utilized -No antibiotics or IVF as per family's request -Pain control with morphine drip -Anticipated in-hospital demise, possibly as early as tonight - family has been notified of impending death      DVT prophylaxis: None - comfort measures Code Status: DNR - confirmed with family Family Communication: None present; I spoke with his daughter by telephone Disposition Plan: Anticipate in-hospital death Consults  called: Palliative care Admission status: Admit - It is my clinical opinion that admission to INPATIENT is reasonable and necessary because of the expectation that this patient will require hospital care that crosses at least 2 midnights to treat this condition based on the medical complexity of the problems presented.  Given the aforementioned information, the predictability of an adverse outcome is felt to be significant.        Karmen Bongo MD [facility]   How to contact the [facility] Attending or Consulting provider [place] or covering provider during after hours [place], for this patient?  1. Check the care team in [facility] and look for a) attending/consulting TR[facility] provider listed and b) the [facility] team listed 2. Log into [email or web address] and use [place]'s universal password to access. If you  do not have the password, please contact the hospital operator. 3. Locate the [facility] provider you are looking for under [facility] and page to a number that you can be directly reached. 4. If you still have difficulty reaching the provider, please page the [facility] (Director on Call) for the Hospitalists listed on amion for assistance.   12/21/2019, 2:12 PM

## 2019-12-23 NOTE — ED Notes (Signed)
Troponin 233

## 2019-12-23 NOTE — ED Notes (Signed)
Lactic 2.1

## 2019-12-23 NOTE — Progress Notes (Signed)
   12/28/2019 2149  Family/Significant Other Communication  Family/Significant Other Update Called   Called and updated daughter Beatrix Fetters of patient's admission to [facility] [facility]. Answered all questions and told her to call me for any needs.

## 2019-12-23 NOTE — ED Notes (Signed)
Daughter Beatrix Fetters   [phone]    Would like an update

## 2019-12-23 NOTE — Progress Notes (Signed)
Pharmacy Antibiotic Note  Jose Leach is a 86 y.o. male admitted on 12/22/2019 with pneumonia and sepsis.  Pharmacy has been consulted for Cefepime and Vancomycin dosing.   Height: 6' (182.9 cm) Weight: 64.5 kg (142 lb 3.2 oz) IBW/kg (Calculated) : 77.6  Temp (24hrs), Avg:103.1 F (39.5 C), Min:103.1 F (39.5 C), Max:103.1 F (39.5 C)  Recent Labs  Lab 12/28/2019 1331 01/12/2020 1343  WBC 7.7  --   CREATININE 5.56* 5.60*  LATICACIDVEN 2.1*  --     Estimated Creatinine Clearance: 8.8 mL/min (A) (by C-G formula based on SCr of 5.6 mg/dL (H)).    Allergies  Allergen Reactions  . Latex Rash  . Tape Rash    Paper tape is ok to use     Antimicrobials this admission: 11/7 Cefepime >>  11/7 Vancomycin >>   Dose adjustments this admission: N/a  Microbiology results: Pending   Plan:  - Cefepime 1g IV q24h - Vancomycin 1250mg  IV x 1 dose  - Will dose vancomycin based on random levels 2/2 AKI scr currently 5.6 baseline ~1  - Monitor patients renal function and urine output  - De-escalate ABX when appropriate   Thank you for allowing pharmacy to be a part of this patient's care.  Duanne Limerick PharmD. BCPS 12/21/2019 2:49 PM

## 2019-12-23 NOTE — Progress Notes (Signed)
Patient taken off Bipap and placed on NRB mask 15 L per MD order for comfort care.  RN at bedside.

## 2019-12-23 NOTE — Progress Notes (Signed)
Manufacturing engineer [facility])  Jose Leach is a recently new hospice referral to [facility] with a terminal diagnosis of Severe protein calorie malnutrition. Family wanted patient sent to ED for SOB, AMS. Patient also recently diagnosed with PNA. Please refer to spouse Jose Leach for goals of care. [place] liaison will follow up as needed if this patient is admitted.  Please call with any questions and concerns.   Clementeen Hoof, BSN, [facility] (in [place]) [phone]

## 2019-12-23 NOTE — Telephone Encounter (Signed)
[facility] Nurse called on call provider to notify PCP that patient COVID-19 test results is positive.Patient already send to the ED per patient's POA request called early by facility Nurse.Positive result not notified to on call provider earlier Unclear when results were received.will notify PCP.

## 2019-12-23 NOTE — ED Provider Notes (Signed)
[facility] EMERGENCY DEPARTMENT Provider Note   CSN: [id number] Arrival date & time: 12/20/2019  1326     History Chief Complaint  Patient presents with  . Shortness of Breath  . Altered Mental Status    Jose Leach is a 86 y.o. male.  86 year old male with prior medical history as detailed below presents for evaluation.  Patient arrives by EMS from his facility.  Patient with recently diagnosed pneumonia.  Patient does have valid DNR/MOST form.   EMS was called at family request for transport to the ED.  Patient with significant increased work of breathing and apparent respiratory distress.  EMS reports room air sats in the mid 60s.  Patient is febrile.  Patient with decreased p.o. intake for the last 3 to 4 days.  Additional history obtained from the patient's daughter Jose Leach.  Patient was to be evaluated by hospice later this week.  Patient looked uncomfortable this afternoon at his facility so family decided to send the patient to the ED primarily for comfort.  Patient's daughter is comfortable with IV fluids, antibiotics, and supplemental O2.  DNR status is confirmed.   The history is provided by the patient, medical records, the EMS personnel and a relative.  Shortness of Breath Severity:  Severe Onset quality:  Gradual Duration:  4 days Timing:  Constant Progression:  Worsening Chronicity:  New Relieved by:  Nothing Worsened by:  Nothing Associated symptoms: fever   Altered Mental Status Associated symptoms: fever        Past Medical History:  Diagnosis Date  . Acute urinary retention 01/28/2013  . AKI (acute kidney injury) ([place]) 01/26/2013  . Altered mental status 09/09/2013  . Anemia    B12 deficiency  . ANXIETY 06/07/2007   Qualifier: Diagnosis of  By: Talbert Cage CMA ([place]), June    . ARTHRITIS 06/07/2007   Qualifier: Diagnosis of  By: Talbert Cage CMA ([place]), June    . Balance problems 04/05/2011  . Benign essential tremor   . Benign  fibroma of prostate 10/11/2011  . Bilateral inguinal hernia 10/09/2012  . Bilateral leg edema 09/13/2012  . Bladder retention 02/05/2013  . Bradycardia, sinus 08/02/2012  . CAROTID ARTERY DISEASE 03/31/2007   Qualifier: Diagnosis of  By: Linna Darner MD, Gwyndolyn Saxon    . Carotid stenosis    s/p L CEA  . Cataract, nuclear 03/21/2014  . 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 Cervical spinal stenosis   . Cervical stenosis of spine   . Critical lower limb ischemia ([place])   . Degeneration macular 11/02/2011  . Depression    Dr Albertine Patricia  . Difficulty in walking(719.7) 04/05/2011  . ESOPHAGITIS 08/06/2002   Qualifier: Diagnosis of  By: Talbert Cage CMA ([place]), June    . Essential and other specified forms of tremor 04/18/2013  . Fall at home 01/06/2017   left hip pain  . Falls   . Family history of colon cancer 07/24/2012  . Fecal impaction ([place]) 10/11/2012  . Femoral hernia, bilateral s/p lap repair 01/16/2013 01/16/2013  . Glaucoma, compensated 03/21/2014  . H/O cardiovascular stress test    a. 02/2003 -> no ischemia/infarct.  Marland Kitchen HAMMER TOE 04/23/2010   Qualifier: Diagnosis of  By: Linna Darner MD, Gwyndolyn Saxon    . Hematoma of leg   . Hip hematoma, right 09/09/2013  . History of shingles 2009  . HTN (hypertension) 08/22/2012  . Hx of echocardiogram    Echocardiogram 08/01/12: Mild LVH, EF 55-60%, normal wall motion.  . Hydrocele 10/11/2011  . Hyperlipidemia   .  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 Hypothyroidism   . Macular degeneration disease   . Macular edema   . Migraines    Dr Jannifer Franklin  . MORTON'S NEUROMA, LEFT 08/22/2007   Qualifier: Diagnosis of  By: Linna Darner MD, Gwyndolyn Saxon    . PAD (peripheral artery disease) ([place])   . Peripheral neuropathy   . Personal history of colonic polyps 01/05/2013   2014 2 mm adenomatous polyp   . PREMATURE ATRIAL CONTRACTIONS 03/14/2006   Qualifier: Diagnosis of  By: Linna Darner MD, Gwendolyn Lima 12/16/2011  . SPINAL STENOSIS 03/14/2006   Qualifier: Diagnosis of  By: Linna Darner MD, William    Cervical spine    . Testosterone deficiency    Dr Lawerance Bach, [facility]  . Unspecified vitamin D deficiency 08/06/2012  . Urge incontinence 10/09/2012  . Urinary tract infection, site not specified 02/13/2013  . Weakness 08/02/2012    Patient Active Problem List   Diagnosis Date Noted  . Acute on chronic respiratory failure with hypoxemia ([place]) 12/21/2019  . Cough 12/18/2019  . Pressure injury of skin 04/27/2019  . CAP (community acquired pneumonia) 04/26/2019  . Generalized osteoarthritis of multiple sites 01/23/2019  . Senile angioma 07/04/2018  . Allergic rhinitis 06/13/2018  . Weight loss 04/04/2018  . Acute left hemiparesis ([place]) 12/25/2017  . 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 Hip fracture, left, sequela 03/17/2017  . Closed nondisplaced fracture of left acetabulum ([place]) 01/17/2017  . Severe protein-calorie malnutrition ([place]) 01/17/2017  . Normocytic anemia 01/10/2017  . Major psychotic depression, recurrent ([place]) 11/17/2016  . Stage 3a chronic kidney disease ([place]) 11/17/2016  . Callus 07/23/2014  . Frequent falls 07/09/2014  . Pain in left hip 05/22/2014  . AMD (age-related macular degeneration), wet ([place]) 03/21/2014  . Glaucoma, compensated 03/21/2014  . Benign prostatic hyperplasia with urinary obstruction 12/27/2013  . Chronic conjunctivitis 10/02/2013  . Knee pain, right 09/25/2013  . Other testicular hypofunction 09/14/2013  . Benign essential tremor 04/18/2013  . Renal cyst, left 01/30/2013  . Bilateral inguinal hernia 10/09/2012  . Urge incontinence 10/09/2012  . Bilateral leg edema 09/13/2012  . Hypertension 08/22/2012  . Unspecified vitamin D deficiency 08/06/2012  . Generalized weakness 08/02/2012  . Bradycardia, sinus 08/02/2012  . Chronic constipation 07/24/2012  . Bladder neck obstruction 10/11/2011  . Hydrocele in adult 10/11/2011  . Hammer toe 04/23/2010  . Unsteady gait 11/12/2009  . MORTON'S NEUROMA,  LEFT 08/22/2007  . Anxiety state 06/07/2007  . Arthropathy 06/07/2007  . CAROTID ARTERY DISEASE 03/31/2007  . PAD (peripheral artery disease) ([place]) 03/16/2007  . Hypothyroidism 11/10/2006  . HYPERLIPIDEMIA 11/10/2006  . PREMATURE ATRIAL CONTRACTIONS 03/14/2006  . Myelopathy concurrent with and due to spinal stenosis of cervical region ([place]) 03/14/2006  . ESOPHAGITIS 08/06/2002    Past Surgical History:  Procedure Laterality Date  . APPENDECTOMY  1941  . BREAST CYST EXCISION Left 01/16/2013   Procedure: MASS EXCISION AXILLA;  Surgeon: Adin Hector, MD;  Location: [facility];  Service: General;  Laterality: Left;  . CAROTID ENDARTERECTOMY  2005    L  . CATARACT EXTRACTION Right   . COLONOSCOPY  2014   Dr. Deatra Ina  . EYE SURGERY Right 02/2012   retina  . INGUINAL HERNIA REPAIR Bilateral 01/16/2013   Procedure: LAPAROSCOPIC BILATERAL INGUINAL DIRECT AND INDIRECT, FEMORAL, AND OBTURATOR HERNIAS;  Surgeon: Adin Hector, MD;  Location: [facility];  Service: General;  Laterality: Bilateral;  . INSERTION OF MESH N/A 01/16/2013  Procedure: INSERTION OF MESH;  Surgeon: Adin Hector, MD;  Location: [facility];  Service: General;  Laterality: N/A;  . LUMBAR LAMINECTOMY    . TONSILLECTOMY AND ADENOIDECTOMY    . TOTAL HIP ARTHROPLASTY  2005   L       Family History  Problem Relation Age of Onset  . Stroke Mother 68  . Hypertension Mother   . Diabetes Mother   . Heart disease Mother   . Rectal cancer Father 66  . Heart disease Father        in 58s  . Cancer Father        colorectal  . Nephritis Sister   . Seizures Brother        died as child    Social History   Tobacco Use  . Smoking status: Former Smoker    Years: 10.00    Types: Pipe, Cigars    Quit date: 02/16/1979    Years since quitting: 40.8  . Smokeless tobacco: Never Used  . Tobacco comment: Quit about age 47   Vaping Use  . Vaping Use: Never used  Substance Use Topics  . Alcohol use: No    Alcohol/week: 4.0 standard  drinks    Types: 4 Standard drinks or equivalent per week  . Drug use: No    Home Medications Prior to Admission medications   Medication Sig Start Date End Date Taking? Authorizing Provider  acetaminophen (TYLENOL) 500 MG tablet Take 1,000 mg by mouth 3 (three) times daily. And q6hprn for pain/fever    [provider]  aspirin 81 MG tablet Take 81 mg by mouth every morning.     [provider]  atorvastatin (LIPITOR) 20 MG tablet Take 20 mg by mouth daily.    [provider]  B Complex-C (B-COMPLEX WITH VITAMIN C) tablet Take 1 tablet by mouth daily.    [provider]  bimatoprost (LUMIGAN) 0.01 % SOLN Place 1 drop into both eyes at bedtime.    [provider]  buPROPion (WELLBUTRIN XL) 300 MG 24 hr tablet Take 300 mg by mouth daily. per psychiatry recommendation    [provider]  Cholecalciferol (VITAMIN D3) 1000 units CAPS Take 1,000 Units by mouth daily.     [provider]  clonazePAM (KLONOPIN) 0.5 MG tablet Take 1 tablet (0.5 mg total) by mouth at bedtime. 08/22/19   [name], MD  dextromethorphan-guaiFENesin (ROBAFEN DM CGH/CHEST CONGEST) 10-100 MG/5ML liquid Take 10 mLs by mouth every 6 (six) hours as needed for cough.    [provider]  erythromycin ophthalmic ointment Place 1 application into both eyes at bedtime.     [provider]  ketoconazole (NIZORAL) 2 % cream Apply 1 application topically 2 (two) times daily as needed for irritation. Apply to buttocks. 05/15/16   [provider]  lactose free nutrition (BOOST) LIQD Take 237 mLs by mouth See admin instructions. 2 BID Between meals, 1 Boost milkshake with QHS snack    [provider]  levofloxacin (LEVAQUIN) 500 MG tablet Take 500 mg by mouth daily. 12/19/19 12/25/19  [provider]  levothyroxine (SYNTHROID) 50 MCG tablet Take 50 mcg by mouth See admin instructions. Take daily except for Sundays 1/2 Tablet =  77mcg.    [provider]  levothyroxine (SYNTHROID, LEVOTHROID) 50 MCG tablet Take 25 mcg by mouth. On Sundays    [provider]  linaclotide Rolan Lipa) 145 MCG CAPS capsule Take 290 mcg by mouth daily before  breakfast.    [provider]  loratadine (CLARITIN) 10 MG tablet Take 10 mg by mouth daily.    [provider]  LORazepam (ATIVAN) 0.5 MG tablet Take 0.5 mg by mouth every 2 (two) hours as needed for anxiety.    [provider]  Melatonin 3 MG TABS Take 3 mg by mouth at bedtime.     [provider]  mirtazapine (REMERON) 30 MG tablet Take 60 mg by mouth at bedtime.    [provider]  morphine 20 MG/5ML solution Take 5 mg by mouth every 2 (two) hours as needed for pain.    [provider]  Multiple Vitamins-Minerals (ICAPS PO) Take 1 tablet by mouth daily.    [provider]  nystatin cream (MYCOSTATIN) Apply 1 application topically 2 (two) times daily.    [provider]  Polyethyl Glycol-Propyl Glycol (SYSTANE) 0.4-0.3 % SOLN Apply 1 drop to eye 4 (four) times daily.    [provider]  polyethylene glycol (MIRALAX / GLYCOLAX) packet Take 17 g by mouth daily. Hold for loose stool    [provider]  QUEtiapine (SEROQUEL) 25 MG tablet Take 25 mg by mouth every morning.     [provider]  QUEtiapine (SEROQUEL) 50 MG tablet Take 50 mg by mouth daily.    [provider]  saccharomyces boulardii (FLORASTOR) 250 MG capsule Take 250 mg by mouth 2 (two) times daily. 12/20/19 12/28/19  [provider]  sertraline (ZOLOFT) 100 MG tablet Take 200 mg by mouth every morning. In the morning. 200mg  in Total.    [provider]  tamsulosin (FLOMAX) 0.4 MG CAPS capsule Take 0.4 mg by mouth daily after supper.  05/24/14   [provider]  topiramate (TOPAMAX) 25 MG tablet Take 25 mg by mouth 2 (two) times daily.    [provider]  zinc oxide 20 %  ointment Apply 1 application topically as needed for irritation.    [provider]    Allergies    Latex and Tape  Review of Systems   Review of Systems  Constitutional: Positive for fever.  Respiratory: Positive for shortness of breath.   All other systems reviewed and are negative.   Physical Exam Updated Vital Signs BP (!) 124/56   Pulse 93   Temp (S) (!) 103.1 F (39.5 C) (Rectal)   Resp (!) 27   Ht 6' (1.829 m)   Wt 64.5 kg   SpO2 (!) 82%   BMI 19.29 kg/m   Physical Exam Vitals and nursing note reviewed.  Constitutional:      General: He is in acute distress.     Appearance: He is ill-appearing.     Comments: Minimally alert, opens eyes to his name -- otherwise does not follow commands.  Will withdraw to painful stimuli in all 4 extremities  HENT:     Head: Normocephalic and atraumatic.  Eyes:     Conjunctiva/sclera: Conjunctivae normal.     Pupils: Pupils are equal, round, and reactive to light.  Cardiovascular:     Rate and Rhythm: Regular rhythm. Tachycardia present.     Heart sounds: Normal heart sounds.  Pulmonary:     Effort: Tachypnea, accessory muscle usage and respiratory distress present.     Breath sounds: Examination of the right-lower field reveals decreased breath sounds. Examination of the left-lower field reveals decreased breath sounds. Decreased breath sounds present.  Abdominal:     General: There is no distension.  Palpations: Abdomen is soft.     Tenderness: There is no abdominal tenderness.  Musculoskeletal:        General: No deformity. Normal range of motion.     Cervical back: Normal range of motion and neck supple.  Skin:    General: Skin is warm and dry.     ED Results / Procedures / Treatments   Labs (all labs ordered are listed, but only abnormal results are displayed) Labs Reviewed  COMPREHENSIVE METABOLIC PANEL - Abnormal; Notable for the following components:      Result Value   Sodium 158 (*)     Chloride 121 (*)    CO2 20 (*)    Glucose, Bld 134 (*)    BUN 166 (*)    Creatinine, Ser 5.56 (*)    Albumin 1.9 (*)    GFR, Estimated 9 (*)    Anion gap 17 (*)    All other components within normal limits  LACTIC ACID, PLASMA - Abnormal; Notable for the following components:   Lactic Acid, Venous 2.1 (*)    All other components within normal limits  CBC WITH DIFFERENTIAL/PLATELET - Abnormal; Notable for the following components:   MCV 101.1 (*)    All other components within normal limits  PROTIME-INR - Abnormal; Notable for the following components:   Prothrombin Time 16.8 (*)    INR 1.4 (*)    All other components within normal limits  I-STAT CHEM 8, ED - Abnormal; Notable for the following components:   Sodium 160 (*)    Chloride 126 (*)    BUN >130 (*)    Creatinine, Ser 5.60 (*)    Glucose, Bld 123 (*)    Calcium, Ion 1.11 (*)    TCO2 19 (*)    All other components within normal limits  TROPONIN I (HIGH SENSITIVITY) - Abnormal; Notable for the following components:   Troponin I (High Sensitivity) 233 (*)    All other components within normal limits  CULTURE, BLOOD (ROUTINE X 2)  CULTURE, BLOOD (ROUTINE X 2)  RESPIRATORY PANEL BY RT PCR (FLU A&B, COVID)  LACTIC ACID, PLASMA  URINALYSIS, ROUTINE W REFLEX MICROSCOPIC  BRAIN NATRIURETIC PEPTIDE  I-STAT ARTERIAL BLOOD GAS, ED    EKG None  Radiology DG Chest Portable 1 View  Result Date: 01/09/2020 CLINICAL DATA:  86 year old male with history of sepsis. Shortness of breath. Altered mental status. EXAM: PORTABLE CHEST 1 VIEW COMPARISON:  Chest x-ray 04/26/2019. FINDINGS: Patchy airspace consolidation throughout the mid to lower lungs bilaterally. Emphysematous changes. Probable small bilateral pleural effusions. No evidence of pulmonary edema. Heart size is normal. Upper mediastinal contours are distorted by patient's rotation to the left. IMPRESSION: 1. Multilobar bilateral pneumonia throughout the mid to lower lungs  bilaterally with small bilateral pleural effusions. 2. Emphysema. Electronically Signed   By: Vinnie Langton M.D.   On: 01/10/2020 14:07    Procedures Procedures (including critical care time) CRITICAL CARE Performed by: Valarie Merino   Total critical care time: 30 minutes  Critical care time was exclusive of separately billable procedures and treating other patients.  Critical care was necessary to treat or prevent imminent or life-threatening deterioration.  Critical care was time spent personally by me on the following activities: development of treatment plan with patient and/or surrogate as well as nursing, discussions with consultants, evaluation of patient's response to treatment, examination of patient, obtaining history from patient or surrogate, ordering and performing treatments and interventions, ordering and review of laboratory  studies, ordering and review of radiographic studies, pulse oximetry and re-evaluation of patient's condition.   Medications Ordered in ED Medications  ceFEPIme (MAXIPIME) 2 g in sodium chloride 0.9 % 100 mL IVPB (2 g Intravenous New Bag/Given 12/31/2019 1417)  vancomycin (VANCOREADY) IVPB 1250 mg/250 mL (has no administration in time range)  acetaminophen (TYLENOL) suppository 650 mg (650 mg Rectal Given 01/05/2020 1356)  morphine 2 MG/ML injection 2 mg (2 mg Intravenous Given 12/25/2019 1405)  sodium chloride 0.9 % bolus 1,000 mL (1,000 mLs Intravenous New Bag/Given 01/11/2020 1417)    ED Course  I have reviewed the triage vital signs and the nursing notes.  Pertinent labs & imaging results that were available during my care of the patient were reviewed by me and considered in my medical decision making (see chart for details).    MDM Rules/Calculators/A&P                          MDM  Screen complete  RUAL VERMEER was evaluated in Emergency Department on 12/22/2019 for the symptoms described in the history of present illness. He was  evaluated in the context of the global COVID-19 pandemic, which necessitated consideration that the patient might be at risk for infection with the SARS-CoV-2 virus that causes COVID-19. Institutional protocols and algorithms that pertain to the evaluation of patients at risk for COVID-19 are in a state of rapid change based on information released by regulatory bodies including the CDC and federal and state organizations. These policies and algorithms were followed during the patient's care in the ED.  Patient is presenting in respiratory distress with likely diagnosis of pneumonia with sepsis.  Patient's DNR/DNI status is concerned with family.  Patient is daughter, Jose Leach, confirms that comfort is of high importance.  Daughter is advised to come to the hospital for face-to-face interaction.  Hospitalist service is aware of case and will evaluate for admission -- primarily for comfort care measures.  Patient has not yet been established with Hospice.  Final Clinical Impression(s) / ED Diagnoses Final diagnoses:  Respiratory distress    Rx / DC Orders ED Discharge Orders    None       Valarie Merino, MD 01/07/2020 1430

## 2019-12-23 NOTE — Telephone Encounter (Signed)
Facility Nurse called states patient being treated for Pneumonia.States patient not eating well.Labs were ordered by on call yesterday but will be drawn tomorrow.Patient's POA would like to send patient to ED for further evaluation.

## 2019-12-24 ENCOUNTER — Other Ambulatory Visit: Payer: Self-pay

## 2019-12-24 DIAGNOSIS — Z515 Encounter for palliative care: Secondary | ICD-10-CM

## 2019-12-24 DIAGNOSIS — R0603 Acute respiratory distress: Secondary | ICD-10-CM | POA: Diagnosis not present

## 2019-12-24 DIAGNOSIS — U071 COVID-19: Secondary | ICD-10-CM | POA: Diagnosis not present

## 2019-12-24 DIAGNOSIS — J9600 Acute respiratory failure, unspecified whether with hypoxia or hypercapnia: Secondary | ICD-10-CM | POA: Diagnosis not present

## 2019-12-24 MED ORDER — HYDROMORPHONE BOLUS VIA INFUSION
1.0000 mg | INTRAVENOUS | Status: DC | PRN
  Filled 2019-12-24: qty 1

## 2019-12-24 MED ORDER — SODIUM CHLORIDE 0.9 % IV SOLN
2.0000 mg/h | INTRAVENOUS | Status: DC
  Administered 2019-12-24: 2 mg/h via INTRAVENOUS
  Filled 2019-12-24: qty 10

## 2019-12-24 MED ORDER — MORPHINE 100MG IN NS 100ML (1MG/ML) PREMIX INFUSION: 5.0000 mg/h | INTRAVENOUS | Status: DC

## 2019-12-28 LAB — CULTURE, BLOOD (ROUTINE X 2)
Culture: NO GROWTH
Culture: NO GROWTH
Special Requests: ADEQUATE
Special Requests: ADEQUATE

## 2020-01-16 NOTE — Progress Notes (Signed)
Nutrition Brief Note  Chart reviewed. Per MD, goal of care is comfort only.  No nutrition interventions warranted at this time.  Please consult as needed.   Corrin Parker, MS, RD, LDN Pager # [phone] After hours/ weekend pager # [phone]

## 2020-01-16 NOTE — Progress Notes (Signed)
Pt expired. Dilaudid IVPB d/c'd 50ml. Witnessed by Laney Potash, RN.

## 2020-01-16 NOTE — Consult Note (Signed)
Consultation Note Date: 11-Jan-2020   Patient Name: Jose Leach  DOB: 07-Jan-1933  MRN: [id number]  Age / Sex: 86 y.o., male  PCP: [name], MD Referring Physician: Thurnell Lose, MD  Reason for Consultation: Non pain symptom management, Pain control and Terminal Care  HPI/Patient Profile: 86 y.o. male  with past medical history of PAD with critical lower limb ischemia, macular degeneration, hypothyroidism, who was admitted on 12/22/2019 with worsening COVID PNA.  Patient had been diagnosed with PNA outpatient and was being treated with Levaquin.   He was placed on BiPAP immediately after admission but progressed to comfort measures based on the patient's previously expressed wishes.  Clinical Assessment and Goals of Care:  I have reviewed medical records including EPIC notes, labs and imaging, received report from bedside night RN and subsequently day RN, and then attempted to speak on the phone with his daughter to discuss diagnosis prognosis, GOC, EOL wishes, disposition and options.  Was only able to leave a voice mail for daughter requesting a call back.  Patient is currently on 15L and was restless on morphine.  This has since been switched to dilaudid and patient is now calming down.  Family has been visiting.   Primary Decision Maker:  NEXT OF KIN    SUMMARY OF RECOMMENDATIONS    Discussed with bedside RN - Dilaudid gtt with boluses PRN for shortness of breath, pain or discomfort. Comfort Measures only. Called daughter.  (wife is reportedly unreachable in nursing facility).   Son is not listed on Face Sheet. Will continue to follow up for comfort needs and attempt to reach the family.  Code Status/Advance Care Planning:  DNR   Symptom Management:   Dilaudid gtt with boluses PRN  Haldol PRN  Comfort measures only.  Additional Recommendations (Limitations, Scope,  Preferences):  Full Comfort Care  Palliative Prophylaxis:   Delirium Protocol  Psycho-social/Spiritual:   Desire for further Chaplaincy support:  Not discussed.  Prognosis:  Hours to days.    Discharge Planning: Anticipated Hospital Death      Primary Diagnoses: Present on Admission: . Acute respiratory failure due to COVID-19 [facility])   I have reviewed the medical record, interviewed the patient and family, and examined the patient. The following aspects are pertinent.  Past Medical History:  Diagnosis Date  . Acute urinary retention 01/28/2013  . AKI (acute kidney injury) ([place]) 01/26/2013  . Altered mental status 09/09/2013  . Anemia    B12 deficiency  . ANXIETY 06/07/2007   Qualifier: Diagnosis of  By: Talbert Cage CMA ([place]), June    . ARTHRITIS 06/07/2007   Qualifier: Diagnosis of  By: Talbert Cage CMA ([place]), June    . Balance problems 04/05/2011  . Benign essential tremor   . Benign fibroma of prostate 10/11/2011  . Bilateral inguinal hernia 10/09/2012  . Bilateral leg edema 09/13/2012  . Bladder retention 02/05/2013  . Bradycardia, sinus 08/02/2012  . CAROTID ARTERY DISEASE 03/31/2007   Qualifier: Diagnosis of  By: Linna Darner MD, Gwyndolyn Saxon    .  Carotid stenosis    s/p L CEA  . Cataract, nuclear 03/21/2014  . 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 Cervical spinal stenosis   . Cervical stenosis of spine   . Critical lower limb ischemia ([place])   . Degeneration macular 11/02/2011  . Depression    Dr Albertine Patricia  . Difficulty in walking(719.7) 04/05/2011  . ESOPHAGITIS 08/06/2002   Qualifier: Diagnosis of  By: Talbert Cage CMA ([place]), June    . Essential and other specified forms of tremor 04/18/2013  . Fall at home 01/06/2017   left hip pain  . Falls   . Family history of colon cancer 07/24/2012  . Fecal impaction ([place]) 10/11/2012  . Femoral hernia, bilateral s/p lap repair 01/16/2013 01/16/2013  . Glaucoma, compensated 03/21/2014  . H/O cardiovascular stress test    a. 02/2003 -> no  ischemia/infarct.  Marland Kitchen HAMMER TOE 04/23/2010   Qualifier: Diagnosis of  By: Linna Darner MD, Gwyndolyn Saxon    . Hematoma of leg   . Hip hematoma, right 09/09/2013  . History of shingles 2009  . HTN (hypertension) 08/22/2012  . Hx of echocardiogram    Echocardiogram 08/01/12: Mild LVH, EF 55-60%, normal wall motion.  . Hydrocele 10/11/2011  . Hyperlipidemia   . 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 Hypothyroidism   . Macular degeneration disease   . Macular edema   . Migraines    Dr Jannifer Franklin  . MORTON'S NEUROMA, LEFT 08/22/2007   Qualifier: Diagnosis of  By: Linna Darner MD, Gwyndolyn Saxon    . PAD (peripheral artery disease) ([place])   . Peripheral neuropathy   . Personal history of colonic polyps 01/05/2013   2014 2 mm adenomatous polyp   . PREMATURE ATRIAL CONTRACTIONS 03/14/2006   Qualifier: Diagnosis of  By: Linna Darner MD, Gwendolyn Lima 12/16/2011  . SPINAL STENOSIS 03/14/2006   Qualifier: Diagnosis of  By: Linna Darner MD, William   Cervical spine    . Testosterone deficiency    Dr Lawerance Bach, [facility]  . Unspecified vitamin D deficiency 08/06/2012  . Urge incontinence 10/09/2012  . Urinary tract infection, site not specified 02/13/2013  . Weakness 08/02/2012   Social History   Socioeconomic History  . Marital status: Married    Spouse name: [name]  . Number of children: 2  . Years of education: Not on file  . Highest education level: Not on file  Occupational History  . Occupation: retired     Fish farm manager: RETIRED  Tobacco Use  . Smoking status: Former Smoker    Years: 10.00    Types: Pipe, Cigars    Quit date: 02/16/1979    Years since quitting: 40.8  . Smokeless tobacco: Never Used  . Tobacco comment: Quit about age 40   Vaping Use  . Vaping Use: Never used  Substance and Sexual Activity  . Alcohol use: No    Alcohol/week: 4.0 standard drinks    Types: 4 Standard drinks or equivalent per week  . Drug use: No  . Sexual activity: Never  Other Topics Concern  . Not on file  Social History Narrative    L[facility] at [facility] 2013   Married - [name]   Former smoker -stopped 1981   Alcohol -wine occasionally    Exercise - walking, Ne-step    POA, Living Will   [employer] with walker   Social Determinants of Health   Financial Resource Strain:   . Difficulty of Paying Living Expenses: Not on file  Food Insecurity:   . Worried About Running  Out of Food in the Last Year: Not on file  . Ran Out of Food in the Last Year: Not on file  Transportation Needs:   . Lack of Transportation (Medical): Not on file  . Lack of Transportation (Non-Medical): Not on file  Physical Activity:   . Days of Exercise per Week: Not on file  . Minutes of Exercise per Session: Not on file  Stress:   . Feeling of Stress : Not on file  Social Connections:   . Frequency of Communication with Friends and Family: Not on file  . Frequency of Social Gatherings with Friends and Family: Not on file  . Attends Religious Services: Not on file  . Active Member of Clubs or Organizations: Not on file  . Attends Archivist Meetings: Not on file  . Marital Status: Not on file   Family History  Problem Relation Age of Onset  . Stroke Mother 32  . Hypertension Mother   . Diabetes Mother   . Heart disease Mother   . Rectal cancer Father 33  . Heart disease Father        in 55s  . Cancer Father        colorectal  . Nephritis Sister   . Seizures Brother        died as child    Allergies  Allergen Reactions  . Tape Rash and Other (See Comments)    USE ONLY PAPER TAPE!!   . Latex Rash      Vital Signs: BP 92/71 (BP Location: Left Arm)   Pulse 90   Temp 98.8 F (37.1 C) (Oral)   Resp (!) 30   Ht 6' (1.829 m)   Wt 57.2 kg   SpO2 (!) 79%   BMI 17.10 kg/m  Pain Scale: Faces POSS *See Group Information*: 1-Acceptable,Awake and alert     SpO2: SpO2: (!) 79 % O2 Device:SpO2: (!) 79 % O2 Flow Rate: .O2 Flow Rate (L/min): 15 L/min    Palliative Assessment/Data: 20%     Time In:  1:00 Time Out: 1:30 Time Total: 30 min. Visit consisted of counseling and education dealing with the complex and emotionally intense issues surrounding the need for palliative care and symptom management in the setting of serious and potentially life-threatening illness. Greater than 50%  of this time was spent counseling and coordinating care related to the above assessment and plan.  Signed by: Florentina Jenny, PA-C Palliative Medicine  Please contact Palliative Medicine Team phone at [phone] for questions and concerns.  For individual provider: See Shea Evans

## 2020-01-16 NOTE — Death Summary Note (Signed)
[facility] Death Note                                                                                                                                                                                               Jose Leach, is a 86 y.o. male, DOB - 04-24-1932, NLZ:[id number]  Admit date - 12/18/2019   Admitting Physician Karmen Bongo, MD  Outpatient Primary MD for the patient is [name], MD  LOS - 1  Chief Complaint  Patient presents with   Shortness of Breath   Altered Mental Status       Notification: [name], MD notified of death of 01-20-2020   Date and Time of Death - 01-20-20 @ 15.14   Pronounced by - RN  History of present illness:   Jose Leach is a 86 y.o. male with medical history significant of psychotic depression; benign essential tremor; and hypothyroidism presenting with worsening PNA, he was brought to the ER where he was diagnosed with acute hypoxic respiratory failure, admitting physician had a detailed conversation with family members and it was decided that patient would be best served with full comfort measures which were started upon admission, he passed away as expected on 01-20-2020 @ 15.14 was pronounced by the RN.    Final Diagnoses:  Cause if death - Covid 36 infection  Signature  Lala Lund M.D on 01/20/20 at 3:26 PM  [facility]  Office Phone -[phone]  Total clinical and documentation time for today Under 30 minutes   Last Note                                                                      PROGRESS NOTE  Patient Demographics:    Jose Leach, is a 86 y.o. male, DOB - 1932-11-04,  WIO:[id number]  Outpatient Primary MD for the patient is [name], MD    LOS - 1  Admit date - 01/07/2020    Chief Complaint  Patient presents with   Shortness of Breath   Altered Mental Status       Brief Narrative (HPI from H&P)    Jose Leach is a 86 y.o. male with medical history significant of psychotic depression; benign essential tremor; and hypothyroidism presenting with worsening PNA, he was brought to the ER where he was diagnosed with acute hypoxic respiratory failure, admitting physician had a detailed conversation with family members and it was decided that patient would be best served with full comfort measures which were started upon admission.   Subjective:    [name] today remains in bed but unresponsive on Dilaudid drip but appears to be in no distress.   Assessment  & Plan :      1. Acute Hypoxic Resp. Failure due to Acute Covid 19 Viral Pneumonitis during the ongoing 2020 Covid 19 Pandemic -in a elderly gentleman with severe dehydration, AKI, mentally sick and appears extremely weak and cachectic, upon admission he was admitted with full comfort measures on Dilaudid drip with palliative care on board.  Goal of care is comfort only.  Expect him to pass away this admission.    Condition - Extremely Guarded  Family Communication  : Old wife [phone] on 01-11-2020 at 11:22 AM no response, voicemail left, called daughter Myra Dayton at 11:20 AM no response.  Code Status :  DNR  Consults  :  Pall. Care  Procedures  :    PUD Prophylaxis :   Disposition Plan  :    Status is: Inpatient  Remains inpatient appropriate because:IV treatments appropriate due to intensity of illness or inability to take PO   Dispo: The patient is from: Home              Anticipated d/c is to: Home              Anticipated d/c date is: 3 days              Patient currently is not medically stable to d/c.   DVT Prophylaxis  :  None  Lab  Results  Component Value Date   PLT  12/30/2019    PLATELET CLUMPS NOTED ON SMEAR, COUNT APPEARS ADEQUATE    Diet :  Diet Order    None       Inpatient Medications  Scheduled Meds: Continuous Infusions:  HYDROmorphone 2 mg/hr (01/11/2020 1420)   PRN Meds:.acetaminophen **OR** acetaminophen, antiseptic oral rinse, glycopyrrolate **OR** glycopyrrolate **OR** glycopyrrolate, haloperidol **OR** haloperidol **OR** haloperidol lactate, HYDROmorphone, LORazepam **OR** [DISCONTINUED] LORazepam **OR** LORazepam, ondansetron **OR** ondansetron (ZOFRAN) IV, polyvinyl alcohol  Antibiotics  :    Anti-infectives (From admission, onward)   Start     Dose/Rate Route Frequency Ordered Stop   Jan 11, 2020 1400  ceFEPIme (MAXIPIME) 1 g in sodium chloride 0.9 % 100 mL IVPB  Status:  Discontinued        1 g 200 mL/hr over 30 Minutes Intravenous Every 24 hours 01/07/2020 1448 01/15/2020 1802   12/22/2019 1448  vancomycin variable dose per unstable renal function (pharmacist dosing)  Status:  Discontinued         Does not apply See admin instructions 01/13/2020 1448 12/25/2019 1802   12/28/2019 1400  ceFEPIme (MAXIPIME) 2 g in sodium chloride 0.9 % 100 mL IVPB        2 g 200 mL/hr over 30 Minutes Intravenous  Once 12/22/2019 1353 01/01/2020 1530   12/18/2019 1400  vancomycin (VANCOREADY) IVPB 1250 mg/250 mL        1,250 mg 166.7 mL/hr over 90 Minutes Intravenous  Once 12/30/2019 1353 12/22/2019 1806       Time Spent in minutes  30   Lala Lund M.D on 12-29-2019 at 3:26 PM  To page go to [email or web address] - password [facility]  [facility] -  Office  [phone]    See all Orders from today for further details    Objective:   Vitals:   01/04/2020 1839 01/12/2020 2005 01/11/2020 2011 12/29/2019 0614  BP: (!) 101/55  92/71   Pulse: 86  90   Resp: (!) 32  (!) 30   Temp:   98.8 F (37.1 C)   TempSrc:   Oral   SpO2: (!) 85%  (!) 79% (!) 79%  Weight:  57.2 kg    Height:  6' (1.829 m)      Wt Readings from Last 3  Encounters:  12/30/2019 57.2 kg  12/21/19 64.5 kg  12/20/19 64.5 kg     Intake/Output Summary (Last 24 hours) at 12-29-2019 1526 Last data filed at 12/30/2019 1806 Gross per 24 hour  Intake 1350 ml  Output --  Net 1350 ml     Physical Exam  Somnolent, appears to be comfortable wearing nonrebreather mask, weak and cachectic, [place].AT,PERRAL Supple Neck,No JVD, No cervical lymphadenopathy appriciated.  Symmetrical Chest wall movement, Good air movement bilaterally, CTAB RRR,No Gallops,Rubs or new Murmurs, No Parasternal Heave +ve B.Sounds, Abd Soft, No tenderness, No organomegaly appriciated, No rebound - guarding or rigidity. No Cyanosis, Clubbing or edema, No new Rash or bruise     Data Review:    CBC Recent Labs  Lab 12/20/2019 1331 01/15/2020 1343  WBC 7.7  --   HGB 13.7 14.6  HCT 45.0 43.0  PLT PLATELET CLUMPS NOTED ON SMEAR, COUNT APPEARS ADEQUATE  --   MCV 101.1*  --   MCH 30.8  --   MCHC 30.4  --   RDW 14.5  --   LYMPHSABS 0.6*  --   MONOABS 0.3  --   EOSABS 0.0  --   BASOSABS 0.1  --     Recent Labs  Lab 01/07/2020 1331 12/20/2019 1334 01/15/2020 1343 01/09/2020 1605  NA 158*  --  160*  --   K 4.7  --  4.5  --   CL 121*  --  126*  --   CO2 20*  --   --   --   GLUCOSE 134*  --  123*  --   BUN 166*  --  >130*  --   CREATININE 5.56*  --  5.60*  --   CALCIUM 9.1  --   --   --   AST 40  --   --   --   ALT 31  --   --   --   ALKPHOS 47  --   --   --   BILITOT 0.7  --   --   --   ALBUMIN 1.9*  --   --   --   LATICACIDVEN 2.1*  --   --  2.7*  INR 1.4*  --   --   --   BNP  --  119.7*  --   --     ------------------------------------------------------------------------------------------------------------------  No results for input(s): CHOL, HDL, LDLCALC, TRIG, CHOLHDL, LDLDIRECT in the last 72 hours.  Lab Results  Component Value Date   HGBA1C 5 01/26/2018    ------------------------------------------------------------------------------------------------------------------ No results for input(s): TSH, T4TOTAL, T3FREE, THYROIDAB in the last 72 hours.  Invalid input(s): FREET3  Cardiac Enzymes No results for input(s): CKMB, TROPONINI, MYOGLOBIN in the last 168 hours.  Invalid input(s): CK ------------------------------------------------------------------------------------------------------------------    Component Value Date/Time   BNP 119.7 (H) 01/11/2020 1334    Micro Results Recent Results (from the past 240 hour(s))  Respiratory Panel by RT PCR (Flu A&B, Covid) - Nasopharyngeal Swab     Status: Abnormal   Collection Time: 12/22/2019  1:34 PM   Specimen: Nasopharyngeal Swab  Result Value Ref Range Status   SARS Coronavirus 2 by RT PCR POSITIVE (A) NEGATIVE Final    Comment: RESULT CALLED TO, READ BACK BY AND VERIFIED WITH: RN K COBB [id number] MLM (NOTE) SARS-CoV-2 target nucleic acids are DETECTED.  SARS-CoV-2 RNA is generally detectable in upper respiratory specimens  during the acute phase of infection. Positive results are indicative of the presence of the identified virus, but do not rule out bacterial infection or co-infection with other pathogens not detected by the test. Clinical correlation with patient history and other diagnostic information is necessary to determine patient infection status. The expected result is Negative.  Fact Sheet for Patients:  PinkCheek.be  Fact Sheet for Healthcare Providers: GravelBags.it  This test is not yet approved or cleared by the [place] FDA and  has been authorized for detection and/or diagnosis of SARS-CoV-2 by FDA under an Emergency Use Authorization (EUA).  This EUA will remain in effect (meaning this test can be used) for  the duration of  the COVID-19 declaration under Section 564(b)(1) of the Act, 21 U.S.C.  section 360bbb-3(b)(1), unless the authorization is terminated or revoked sooner.      Influenza A by PCR NEGATIVE NEGATIVE Final   Influenza B by PCR NEGATIVE NEGATIVE Final    Comment: (NOTE) The Xpert Xpress SARS-CoV-2/FLU/RSV assay is intended as an aid in  the diagnosis of influenza from Nasopharyngeal swab specimens and  should not be used as a sole basis for treatment. Nasal washings and  aspirates are unacceptable for Xpert Xpress SARS-CoV-2/FLU/RSV  testing.  Fact Sheet for Patients: PinkCheek.be  Fact Sheet for Healthcare Providers: GravelBags.it  This test is not yet approved or cleared by the [place] FDA and  has been authorized for detection and/or diagnosis of SARS-CoV-2 by  FDA under an Emergency Use Authorization (EUA). This EUA will remain  in effect (meaning this test can be used) for the duration of the  Covid-19 declaration under Section 564(b)(1) of the Act, 21  U.S.C. section 360bbb-3(b)(1), unless the authorization is  terminated or revoked. Performed at [facility], [place] [address] [place], [place] [address]   Culture, blood (Routine x 2)     Status: None (Preliminary result)   Collection Time: 01/07/2020  1:36 PM   Specimen: BLOOD RIGHT FOREARM  Result Value Ref Range Status   Specimen Description BLOOD RIGHT FOREARM  Final   Special Requests   Final    BOTTLES DRAWN AEROBIC AND ANAEROBIC Blood Culture adequate volume   Culture   Final    NO GROWTH < 24 HOURS Performed at [facility], [place] [address] [place], [place] [address]    Report Status PENDING  Incomplete  Culture, blood (Routine x 2)  Status: None (Preliminary result)   Collection Time: 12/30/2019  2:14 PM   Specimen: BLOOD RIGHT FOREARM  Result Value Ref Range Status   Specimen Description BLOOD RIGHT FOREARM  Final   Special Requests   Final    BOTTLES DRAWN AEROBIC AND ANAEROBIC Blood Culture adequate volume    Culture   Final    NO GROWTH < 24 HOURS Performed at [facility], [address] [place], [place] [address]    Report Status PENDING  Incomplete  Respiratory Panel by RT PCR (Flu A&B, Covid) - Nasopharyngeal Swab     Status: Abnormal   Collection Time: 12/31/2019  4:26 PM   Specimen: Nasopharyngeal Swab  Result Value Ref Range Status   SARS Coronavirus 2 by RT PCR POSITIVE (A) NEGATIVE Final    Comment: RESULT CALLED TO, READ BACK BY AND VERIFIED WITH: K. COBB RN, AT 7564 12/19/2019 D. VANHOOK (NOTE) SARS-CoV-2 target nucleic acids are DETECTED.  SARS-CoV-2 RNA is generally detectable in upper respiratory specimens  during the acute phase of infection. Positive results are indicative of the presence of the identified virus, but do not rule out bacterial infection or co-infection with other pathogens not detected by the test. Clinical correlation with patient history and other diagnostic information is necessary to determine patient infection status. The expected result is Negative.  Fact Sheet for Patients:  PinkCheek.be  Fact Sheet for Healthcare Providers: GravelBags.it  This test is not yet approved or cleared by the [place] FDA and  has been authorized for detection and/or diagnosis of SARS-CoV-2 by FDA under an Emergency Use Authorization (EUA).  This EUA will remain in effect (meaning this test can  be used) for the duration of  the COVID-19 declaration under Section 564(b)(1) of the Act, 21 U.S.C. section 360bbb-3(b)(1), unless the authorization is terminated or revoked sooner.      Influenza A by PCR NEGATIVE NEGATIVE Final   Influenza B by PCR NEGATIVE NEGATIVE Final    Comment: (NOTE) The Xpert Xpress SARS-CoV-2/FLU/RSV assay is intended as an aid in  the diagnosis of influenza from Nasopharyngeal swab specimens and  should not be used as a sole basis for treatment. Nasal washings and   aspirates are unacceptable for Xpert Xpress SARS-CoV-2/FLU/RSV  testing.  Fact Sheet for Patients: PinkCheek.be  Fact Sheet for Healthcare Providers: GravelBags.it  This test is not yet approved or cleared by the [place] FDA and  has been authorized for detection and/or diagnosis of SARS-CoV-2 by  FDA under an Emergency Use Authorization (EUA). This EUA will remain  in effect (meaning this test can be used) for the duration of the  Covid-19 declaration under Section 564(b)(1) of the Act, 21  U.S.C. section 360bbb-3(b)(1), unless the authorization is  terminated or revoked. Performed at [facility], [place] [address] [place], [place] [address]     Radiology Reports DG Chest Portable 1 View  Result Date: 01/08/2020 CLINICAL DATA:  86 year old male with history of sepsis. Shortness of breath. Altered mental status. EXAM: PORTABLE CHEST 1 VIEW COMPARISON:  Chest x-ray 04/26/2019. FINDINGS: Patchy airspace consolidation throughout the mid to lower lungs bilaterally. Emphysematous changes. Probable small bilateral pleural effusions. No evidence of pulmonary edema. Heart size is normal. Upper mediastinal contours are distorted by patient's rotation to the left. IMPRESSION: 1. Multilobar bilateral pneumonia throughout the mid to lower lungs bilaterally with small bilateral pleural effusions. 2. Emphysema. Electronically Signed   By: Vinnie Langton M.D.   On: 12/22/2019  14:07  ° ° °

## 2020-01-16 NOTE — Progress Notes (Signed)
Pt's family came to see patient. Comfort provided and answered all questions family had. No further needs at this time. Family understands that they are able to come and visit pt dt extenuating circumstances.

## 2020-01-16 NOTE — Progress Notes (Signed)
PROGRESS NOTE                                                                                                                                                                                                             Patient Demographics:    Jose Leach, is a 86 y.o. male, DOB - 10/05/1932, TML:[id number]  Outpatient Primary MD for the patient is Jose Dad, MD    LOS - 1  Admit date - 12/20/2019    Chief Complaint  Patient presents with  . Shortness of Breath  . Altered Mental Status       Brief Narrative (HPI from H&P)    Jose Leach is a 86 y.o. male with medical history significant of psychotic depression; benign essential tremor; and hypothyroidism presenting with worsening PNA, he was brought to the ER where he was diagnosed with acute hypoxic respiratory failure, admitting physician had a detailed conversation with family members and it was decided that patient would be best served with full comfort measures which were started upon admission.   Subjective:    Jose Leach today remains in bed but unresponsive on Dilaudid drip but appears to be in no distress.   Assessment  & Plan :      1. Acute Hypoxic Resp. Failure due to Acute Covid 19 Viral Pneumonitis during the ongoing 2020 Covid 19 Pandemic -in a elderly gentleman with severe dehydration, AKI, mentally sick and appears extremely weak and cachectic, upon admission he was admitted with full comfort measures on Dilaudid drip with palliative care on board.  Goal of care is comfort only.  Expect him to pass away this admission.    Condition - Extremely Guarded  Family Communication  : Old wife [phone] on 2019-12-28 at 11:22 AM no response, voicemail left, called daughter Jose Leach at 11:20 AM no response.  Code Status :  DNR  Consults  :  Pall. Care  Procedures  :    PUD Prophylaxis :   Disposition Plan  :     Status is: Inpatient  Remains inpatient appropriate because:IV treatments appropriate due to intensity of illness or inability to take PO   Dispo: The patient is from: Home              Anticipated d/c is to: Home  Anticipated d/c date is: 3 days              Patient currently is not medically stable to d/c.   DVT Prophylaxis  :  None  Lab Results  Component Value Date   PLT  01/09/2020    PLATELET CLUMPS NOTED ON SMEAR, COUNT APPEARS ADEQUATE    Diet :  Diet Order    None       Inpatient Medications  Scheduled Meds: Continuous Infusions: . HYDROmorphone 2 mg/hr (08-Jan-2020 0814)   PRN Meds:.acetaminophen **OR** acetaminophen, antiseptic oral rinse, glycopyrrolate **OR** glycopyrrolate **OR** glycopyrrolate, haloperidol **OR** haloperidol **OR** haloperidol lactate, HYDROmorphone, LORazepam **OR** [DISCONTINUED] LORazepam **OR** LORazepam, ondansetron **OR** ondansetron (ZOFRAN) IV, polyvinyl alcohol  Antibiotics  :    Anti-infectives (From admission, onward)   Start     Dose/Rate Route Frequency Ordered Stop   Jan 08, 2020 1400  ceFEPIme (MAXIPIME) 1 g in sodium chloride 0.9 % 100 mL IVPB  Status:  Discontinued        1 g 200 mL/hr over 30 Minutes Intravenous Every 24 hours 12/28/2019 1448 01/14/2020 1802   12/29/2019 1448  vancomycin variable dose per unstable renal function (pharmacist dosing)  Status:  Discontinued         Does not apply See admin instructions 12/17/2019 1448 01/08/2020 1802   12/21/2019 1400  ceFEPIme (MAXIPIME) 2 g in sodium chloride 0.9 % 100 mL IVPB        2 g 200 mL/hr over 30 Minutes Intravenous  Once 01/04/2020 1353 01/13/2020 1530   12/26/2019 1400  vancomycin (VANCOREADY) IVPB 1250 mg/250 mL        1,250 mg 166.7 mL/hr over 90 Minutes Intravenous  Once 01/07/2020 1353 12/21/2019 1806       Time Spent in minutes  30   Lala Lund M.D on 2020-01-08 at 11:20 AM  To page go to [email or web address] - password [facility]  [facility] -  Office   [phone]    See all Orders from today for further details    Objective:   Vitals:   01/03/2020 1839 12/19/2019 2005 12/21/2019 2011 2020-01-08 0614  BP: (!) 101/55  92/71   Pulse: 86  90   Resp: (!) 32  (!) 30   Temp:   98.8 F (37.1 C)   TempSrc:   Oral   SpO2: (!) 85%  (!) 79% (!) 79%  Weight:  57.2 kg    Height:  6' (1.829 m)      Wt Readings from Last 3 Encounters:  12/27/2019 57.2 kg  12/21/19 64.5 kg  12/20/19 64.5 kg     Intake/Output Summary (Last 24 hours) at 08-Jan-2020 1120 Last data filed at 01/08/2020 1806 Gross per 24 hour  Intake 1350 ml  Output --  Net 1350 ml     Physical Exam  Somnolent, appears to be comfortable wearing nonrebreather mask, weak and cachectic, [place].AT,PERRAL Supple Neck,No JVD, No cervical lymphadenopathy appriciated.  Symmetrical Chest wall movement, Good air movement bilaterally, CTAB RRR,No Gallops,Rubs or new Murmurs, No Parasternal Heave +ve B.Sounds, Abd Soft, No tenderness, No organomegaly appriciated, No rebound - guarding or rigidity. No Cyanosis, Clubbing or edema, No new Rash or bruise     Data Review:    CBC Recent Labs  Lab 12/19/2019 1331 01/09/2020 1343  WBC 7.7  --   HGB 13.7 14.6  HCT 45.0 43.0  PLT PLATELET CLUMPS NOTED ON SMEAR, COUNT APPEARS ADEQUATE  --   MCV 101.1*  --   MCH 30.8  --  MCHC 30.4  --   RDW 14.5  --   LYMPHSABS 0.6*  --   MONOABS 0.3  --   EOSABS 0.0  --   BASOSABS 0.1  --     Recent Labs  Lab 12/31/2019 1331 01/09/2020 1334 12/28/2019 1343 01/08/2020 1605  NA 158*  --  160*  --   K 4.7  --  4.5  --   CL 121*  --  126*  --   CO2 20*  --   --   --   GLUCOSE 134*  --  123*  --   BUN 166*  --  >130*  --   CREATININE 5.56*  --  5.60*  --   CALCIUM 9.1  --   --   --   AST 40  --   --   --   ALT 31  --   --   --   ALKPHOS 47  --   --   --   BILITOT 0.7  --   --   --   ALBUMIN 1.9*  --   --   --   LATICACIDVEN 2.1*  --   --  2.7*  INR 1.4*  --   --   --   BNP  --  119.7*  --   --      ------------------------------------------------------------------------------------------------------------------ No results for input(s): CHOL, HDL, LDLCALC, TRIG, CHOLHDL, LDLDIRECT in the last 72 hours.  Lab Results  Component Value Date   HGBA1C 5 01/26/2018   ------------------------------------------------------------------------------------------------------------------ No results for input(s): TSH, T4TOTAL, T3FREE, THYROIDAB in the last 72 hours.  Invalid input(s): FREET3  Cardiac Enzymes No results for input(s): CKMB, TROPONINI, MYOGLOBIN in the last 168 hours.  Invalid input(s): CK ------------------------------------------------------------------------------------------------------------------    Component Value Date/Time   BNP 119.7 (H) 12/26/2019 1334    Micro Results Recent Results (from the past 240 hour(s))  Respiratory Panel by RT PCR (Flu A&B, Covid) - Nasopharyngeal Swab     Status: Abnormal   Collection Time: 01/15/2020  1:34 PM   Specimen: Nasopharyngeal Swab  Result Value Ref Range Status   SARS Coronavirus 2 by RT PCR POSITIVE (A) NEGATIVE Final    Comment: RESULT CALLED TO, READ BACK BY AND VERIFIED WITH: RN K COBB [id number] MLM (NOTE) SARS-CoV-2 target nucleic acids are DETECTED.  SARS-CoV-2 RNA is generally detectable in upper respiratory specimens  during the acute phase of infection. Positive results are indicative of the presence of the identified virus, but do not rule out bacterial infection or co-infection with other pathogens not detected by the test. Clinical correlation with patient history and other diagnostic information is necessary to determine patient infection status. The expected result is Negative.  Fact Sheet for Patients:  PinkCheek.be  Fact Sheet for Healthcare Providers: GravelBags.it  This test is not yet approved or cleared by the [place] FDA and  has  been authorized for detection and/or diagnosis of SARS-CoV-2 by FDA under an Emergency Use Authorization (EUA).  This EUA will remain in effect (meaning this test can be used) for  the duration of  the COVID-19 declaration under Section 564(b)(1) of the Act, 21 U.S.C. section 360bbb-3(b)(1), unless the authorization is terminated or revoked sooner.      Influenza A by PCR NEGATIVE NEGATIVE Final   Influenza B by PCR NEGATIVE NEGATIVE Final    Comment: (NOTE) The Xpert Xpress SARS-CoV-2/FLU/RSV assay is intended as an aid in  the diagnosis of influenza from Nasopharyngeal swab specimens  and  should not be used as a sole basis for treatment. Nasal washings and  aspirates are unacceptable for Xpert Xpress SARS-CoV-2/FLU/RSV  testing.  Fact Sheet for Patients: PinkCheek.be  Fact Sheet for Healthcare Providers: GravelBags.it  This test is not yet approved or cleared by the [place] FDA and  has been authorized for detection and/or diagnosis of SARS-CoV-2 by  FDA under an Emergency Use Authorization (EUA). This EUA will remain  in effect (meaning this test can be used) for the duration of the  Covid-19 declaration under Section 564(b)(1) of the Act, 21  U.S.C. section 360bbb-3(b)(1), unless the authorization is  terminated or revoked. Performed at [facility], [place] [address], [place], [place] [address]   Culture, blood (Routine x 2)     Status: None (Preliminary result)   Collection Time: 12/21/2019  1:36 PM   Specimen: BLOOD RIGHT FOREARM  Result Value Ref Range Status   Specimen Description BLOOD RIGHT FOREARM  Final   Special Requests   Final    BOTTLES DRAWN AEROBIC AND ANAEROBIC Blood Culture adequate volume   Culture   Final    NO GROWTH < 24 HOURS Performed at [facility], [place] [address], [place], [place] [address]    Report Status PENDING  Incomplete  Culture, blood (Routine x 2)     Status: None  (Preliminary result)   Collection Time: 12/29/2019  2:14 PM   Specimen: BLOOD RIGHT FOREARM  Result Value Ref Range Status   Specimen Description BLOOD RIGHT FOREARM  Final   Special Requests   Final    BOTTLES DRAWN AEROBIC AND ANAEROBIC Blood Culture adequate volume   Culture   Final    NO GROWTH < 24 HOURS Performed at [facility], [place] [address] [place], [place] [address]    Report Status PENDING  Incomplete  Respiratory Panel by RT PCR (Flu A&B, Covid) - Nasopharyngeal Swab     Status: Abnormal   Collection Time: 01/11/2020  4:26 PM   Specimen: Nasopharyngeal Swab  Result Value Ref Range Status   SARS Coronavirus 2 by RT PCR POSITIVE (A) NEGATIVE Final    Comment: RESULT CALLED TO, READ BACK BY AND VERIFIED WITH: K. COBB RN, AT 7616 01/09/2020 D. VANHOOK (NOTE) SARS-CoV-2 target nucleic acids are DETECTED.  SARS-CoV-2 RNA is generally detectable in upper respiratory specimens  during the acute phase of infection. Positive results are indicative of the presence of the identified virus, but do not rule out bacterial infection or co-infection with other pathogens not detected by the test. Clinical correlation with patient history and other diagnostic information is necessary to determine patient infection status. The expected result is Negative.  Fact Sheet for Patients:  PinkCheek.be  Fact Sheet for Healthcare Providers: GravelBags.it  This test is not yet approved or cleared by the [place] FDA and  has been authorized for detection and/or diagnosis of SARS-CoV-2 by FDA under an Emergency Use Authorization (EUA).  This EUA will remain in effect (meaning this test can  be used) for the duration of  the COVID-19 declaration under Section 564(b)(1) of the Act, 21 U.S.C. section 360bbb-3(b)(1), unless the authorization is terminated or revoked sooner.      Influenza A by PCR NEGATIVE NEGATIVE Final    Influenza B by PCR NEGATIVE NEGATIVE Final    Comment: (NOTE) The Xpert Xpress SARS-CoV-2/FLU/RSV assay is intended as an aid in  the diagnosis of influenza from Nasopharyngeal swab specimens and  should not be used as a sole basis for treatment. Nasal washings and  aspirates are unacceptable for Xpert Xpress SARS-CoV-2/FLU/RSV  testing.  Fact Sheet for Patients: PinkCheek.be  Fact Sheet for Healthcare Providers: GravelBags.it  This test is not yet approved or cleared by the [place] FDA and  has been authorized for detection and/or diagnosis of SARS-CoV-2 by  FDA under an Emergency Use Authorization (EUA). This EUA will remain  in effect (meaning this test can be used) for the duration of the  Covid-19 declaration under Section 564(b)(1) of the Act, 21  U.S.C. section 360bbb-3(b)(1), unless the authorization is  terminated or revoked. Performed at [facility], [place] [address], [place], [place] [address]     Radiology Reports DG Chest Portable 1 View  Result Date: 01/11/2020 CLINICAL DATA:  86 year old male with history of sepsis. Shortness of breath. Altered mental status. EXAM: PORTABLE CHEST 1 VIEW COMPARISON:  Chest x-ray 04/26/2019. FINDINGS: Patchy airspace consolidation throughout the mid to lower lungs bilaterally. Emphysematous changes. Probable small bilateral pleural effusions. No evidence of pulmonary edema. Heart size is normal. Upper mediastinal contours are distorted by patient's rotation to the left. IMPRESSION: 1. Multilobar bilateral pneumonia throughout the mid to lower lungs bilaterally with small bilateral pleural effusions. 2. Emphysema. Electronically Signed   By: Vinnie Langton M.D.   On: 01/14/2020 14:07

## 2020-01-16 NOTE — Progress Notes (Signed)
[facility])     This patient currently enrolled in hospice services with [facility].  Jose Leach was admitted onto hospice services on Saturday, November 6.  [facility] will continue to follow for any discharge planning needs and to coordinate continuation of hospice care.    If you have questions or need assistance, please call [phone] or contact the hospital Liaison listed on AMION.     Thank you for the opportunity to participate in this patient's care.     Domenic Moras, BSN, RN [facility] Liaison   [phone]

## 2020-01-16 NOTE — Progress Notes (Signed)
Added Comfort Measures order. Changed to dilaudid drip to avoid morphine in this gentleman with renal failure.  Florentina Jenny, PA-C Palliative Medicine Office:  [phone]

## 2020-01-16 DEATH — deceased
